# Patient Record
Sex: Female | Born: 1945
Health system: Southern US, Community
[De-identification: ages and names within clinical notes are randomized; demographics above are authoritative.]

## PROBLEM LIST (undated history)

## (undated) DIAGNOSIS — IMO0001 Reserved for inherently not codable concepts without codable children: Secondary | ICD-10-CM

## (undated) DIAGNOSIS — F419 Anxiety disorder, unspecified: Secondary | ICD-10-CM

## (undated) DIAGNOSIS — N184 Chronic kidney disease, stage 4 (severe): Secondary | ICD-10-CM

## (undated) DIAGNOSIS — F329 Major depressive disorder, single episode, unspecified: Secondary | ICD-10-CM

## (undated) DIAGNOSIS — K449 Diaphragmatic hernia without obstruction or gangrene: Secondary | ICD-10-CM

## (undated) DIAGNOSIS — F32A Depression, unspecified: Secondary | ICD-10-CM

## (undated) DIAGNOSIS — J189 Pneumonia, unspecified organism: Secondary | ICD-10-CM

## (undated) DIAGNOSIS — Z5189 Encounter for other specified aftercare: Secondary | ICD-10-CM

## (undated) DIAGNOSIS — M549 Dorsalgia, unspecified: Secondary | ICD-10-CM

## (undated) DIAGNOSIS — G4700 Insomnia, unspecified: Secondary | ICD-10-CM

## (undated) DIAGNOSIS — I10 Essential (primary) hypertension: Secondary | ICD-10-CM

## (undated) DIAGNOSIS — G8929 Other chronic pain: Secondary | ICD-10-CM

## (undated) DIAGNOSIS — C320 Malignant neoplasm of glottis: Secondary | ICD-10-CM

## (undated) DIAGNOSIS — D649 Anemia, unspecified: Secondary | ICD-10-CM

## (undated) DIAGNOSIS — I209 Angina pectoris, unspecified: Secondary | ICD-10-CM

## (undated) DIAGNOSIS — S2239XA Fracture of one rib, unspecified side, initial encounter for closed fracture: Secondary | ICD-10-CM

## (undated) DIAGNOSIS — Z931 Gastrostomy status: Secondary | ICD-10-CM

## (undated) DIAGNOSIS — I639 Cerebral infarction, unspecified: Secondary | ICD-10-CM

## (undated) DIAGNOSIS — R51 Headache: Secondary | ICD-10-CM

## (undated) DIAGNOSIS — Z923 Personal history of irradiation: Secondary | ICD-10-CM

## (undated) DIAGNOSIS — S2249XA Multiple fractures of ribs, unspecified side, initial encounter for closed fracture: Secondary | ICD-10-CM

## (undated) DIAGNOSIS — K5900 Constipation, unspecified: Secondary | ICD-10-CM

## (undated) DIAGNOSIS — R110 Nausea: Secondary | ICD-10-CM

## (undated) DIAGNOSIS — C55 Malignant neoplasm of uterus, part unspecified: Secondary | ICD-10-CM

## (undated) DIAGNOSIS — J449 Chronic obstructive pulmonary disease, unspecified: Secondary | ICD-10-CM

## (undated) DIAGNOSIS — I739 Peripheral vascular disease, unspecified: Secondary | ICD-10-CM

## (undated) DIAGNOSIS — E7800 Pure hypercholesterolemia, unspecified: Secondary | ICD-10-CM

## (undated) DIAGNOSIS — K219 Gastro-esophageal reflux disease without esophagitis: Secondary | ICD-10-CM

## (undated) HISTORY — PX: HERNIA REPAIR: SHX51

## (undated) HISTORY — PX: GASTROTOMY: SHX61

## (undated) HISTORY — DX: Encounter for other specified aftercare: Z51.89

## (undated) HISTORY — PX: ABDOMINAL SURGERY: SHX537

## (undated) HISTORY — PX: APPENDECTOMY: SHX54

---

## 2003-05-02 ENCOUNTER — Encounter: Admission: RE | Admit: 2003-05-02 | Discharge: 2003-05-02 | Payer: Self-pay | Admitting: Internal Medicine

## 2003-05-02 ENCOUNTER — Encounter: Payer: Self-pay | Admitting: Internal Medicine

## 2004-12-30 ENCOUNTER — Encounter: Admission: RE | Admit: 2004-12-30 | Discharge: 2004-12-30 | Payer: Self-pay | Admitting: *Deleted

## 2009-10-30 ENCOUNTER — Emergency Department (HOSPITAL_COMMUNITY): Admission: EM | Admit: 2009-10-30 | Discharge: 2009-10-31 | Payer: Self-pay | Admitting: Emergency Medicine

## 2009-12-14 DIAGNOSIS — I639 Cerebral infarction, unspecified: Secondary | ICD-10-CM

## 2009-12-14 HISTORY — DX: Cerebral infarction, unspecified: I63.9

## 2010-07-09 ENCOUNTER — Observation Stay (HOSPITAL_COMMUNITY): Admission: EM | Admit: 2010-07-09 | Discharge: 2010-07-10 | Payer: Self-pay | Admitting: Emergency Medicine

## 2010-09-07 ENCOUNTER — Emergency Department (HOSPITAL_COMMUNITY): Admission: EM | Admit: 2010-09-07 | Discharge: 2010-09-07 | Payer: Self-pay | Admitting: Emergency Medicine

## 2011-02-28 LAB — URINE CULTURE
Culture  Setup Time: 201107280914
Special Requests: NEGATIVE

## 2011-02-28 LAB — DIFFERENTIAL
Basophils Relative: 1 % (ref 0–1)
Eosinophils Absolute: 0.1 10*3/uL (ref 0.0–0.7)
Eosinophils Relative: 1 % (ref 0–5)
Lymphs Abs: 2.1 10*3/uL (ref 0.7–4.0)

## 2011-02-28 LAB — CBC
MCH: 29.2 pg (ref 26.0–34.0)
MCHC: 35.1 g/dL (ref 30.0–36.0)
MCV: 83.4 fL (ref 78.0–100.0)
Platelets: 241 10*3/uL (ref 150–400)
RBC: 4.32 MIL/uL (ref 3.87–5.11)
RDW: 13.2 % (ref 11.5–15.5)

## 2011-02-28 LAB — POCT I-STAT, CHEM 8
BUN: 44 mg/dL — ABNORMAL HIGH (ref 6–23)
Creatinine, Ser: 1.6 mg/dL — ABNORMAL HIGH (ref 0.4–1.2)
Sodium: 139 mEq/L (ref 135–145)
TCO2: 21 mmol/L (ref 0–100)

## 2011-02-28 LAB — TROPONIN I: Troponin I: 0.02 ng/mL (ref 0.00–0.06)

## 2011-02-28 LAB — HEMOGLOBIN A1C: Mean Plasma Glucose: 128 mg/dL — ABNORMAL HIGH (ref <117)

## 2011-02-28 LAB — CK TOTAL AND CKMB (NOT AT ARMC)
CK, MB: 0.7 ng/mL (ref 0.3–4.0)
Total CK: 40 U/L (ref 7–177)

## 2011-02-28 LAB — POCT CARDIAC MARKERS

## 2011-03-18 LAB — CBC
HCT: 42.9 % (ref 36.0–46.0)
Hemoglobin: 14.5 g/dL (ref 12.0–15.0)
MCHC: 33.8 g/dL (ref 30.0–36.0)
RDW: 13 % (ref 11.5–15.5)

## 2011-03-18 LAB — TROPONIN I: Troponin I: 0.01 ng/mL (ref 0.00–0.06)

## 2011-03-18 LAB — DIFFERENTIAL
Basophils Absolute: 0.1 10*3/uL (ref 0.0–0.1)
Basophils Relative: 1 % (ref 0–1)
Eosinophils Relative: 2 % (ref 0–5)
Monocytes Absolute: 0.7 10*3/uL (ref 0.1–1.0)

## 2011-03-18 LAB — POCT I-STAT, CHEM 8
BUN: 19 mg/dL (ref 6–23)
Calcium, Ion: 1.11 mmol/L — ABNORMAL LOW (ref 1.12–1.32)
HCT: 45 % (ref 36.0–46.0)
TCO2: 25 mmol/L (ref 0–100)

## 2011-03-18 LAB — ALDOSTERONE + RENIN ACTIVITY W/ RATIO: Aldosterone: 2 ng/dL

## 2011-03-18 LAB — TSH: TSH: 1.993 u[IU]/mL (ref 0.350–4.500)

## 2011-03-18 LAB — CK TOTAL AND CKMB (NOT AT ARMC)
Relative Index: INVALID (ref 0.0–2.5)
Total CK: 160 U/L (ref 7–177)

## 2011-03-18 LAB — PROTIME-INR: INR: 1.04 (ref 0.00–1.49)

## 2011-12-15 HISTORY — PX: GASTROSTOMY W/ FEEDING TUBE: SUR642

## 2011-12-15 HISTORY — PX: OTHER SURGICAL HISTORY: SHX169

## 2012-08-08 ENCOUNTER — Encounter (HOSPITAL_COMMUNITY): Payer: Self-pay | Admitting: *Deleted

## 2012-08-08 ENCOUNTER — Emergency Department (HOSPITAL_COMMUNITY): Payer: Medicaid Other

## 2012-08-08 ENCOUNTER — Inpatient Hospital Stay (HOSPITAL_COMMUNITY)
Admission: EM | Admit: 2012-08-08 | Discharge: 2012-08-22 | DRG: 011 | Disposition: A | Discharge status: Home Health | Payer: Medicaid Other | Attending: Internal Medicine | Admitting: Internal Medicine

## 2012-08-08 DIAGNOSIS — S2239XA Fracture of one rib, unspecified side, initial encounter for closed fracture: Secondary | ICD-10-CM

## 2012-08-08 DIAGNOSIS — F419 Anxiety disorder, unspecified: Secondary | ICD-10-CM

## 2012-08-08 DIAGNOSIS — S2242XA Multiple fractures of ribs, left side, initial encounter for closed fracture: Secondary | ICD-10-CM

## 2012-08-08 DIAGNOSIS — S2249XA Multiple fractures of ribs, unspecified side, initial encounter for closed fracture: Secondary | ICD-10-CM | POA: Diagnosis present

## 2012-08-08 DIAGNOSIS — Z8542 Personal history of malignant neoplasm of other parts of uterus: Secondary | ICD-10-CM

## 2012-08-08 DIAGNOSIS — D630 Anemia in neoplastic disease: Secondary | ICD-10-CM | POA: Diagnosis present

## 2012-08-08 DIAGNOSIS — K449 Diaphragmatic hernia without obstruction or gangrene: Secondary | ICD-10-CM | POA: Diagnosis present

## 2012-08-08 DIAGNOSIS — E7800 Pure hypercholesterolemia, unspecified: Secondary | ICD-10-CM | POA: Diagnosis present

## 2012-08-08 DIAGNOSIS — D649 Anemia, unspecified: Secondary | ICD-10-CM | POA: Diagnosis present

## 2012-08-08 DIAGNOSIS — C321 Malignant neoplasm of supraglottis: Principal | ICD-10-CM | POA: Diagnosis present

## 2012-08-08 DIAGNOSIS — N179 Acute kidney failure, unspecified: Secondary | ICD-10-CM | POA: Diagnosis present

## 2012-08-08 DIAGNOSIS — I10 Essential (primary) hypertension: Secondary | ICD-10-CM | POA: Diagnosis present

## 2012-08-08 DIAGNOSIS — Z79899 Other long term (current) drug therapy: Secondary | ICD-10-CM

## 2012-08-08 DIAGNOSIS — IMO0002 Reserved for concepts with insufficient information to code with codable children: Secondary | ICD-10-CM

## 2012-08-08 DIAGNOSIS — C320 Malignant neoplasm of glottis: Secondary | ICD-10-CM | POA: Diagnosis present

## 2012-08-08 DIAGNOSIS — F17200 Nicotine dependence, unspecified, uncomplicated: Secondary | ICD-10-CM | POA: Diagnosis present

## 2012-08-08 DIAGNOSIS — Z720 Tobacco use: Secondary | ICD-10-CM | POA: Diagnosis present

## 2012-08-08 DIAGNOSIS — C329 Malignant neoplasm of larynx, unspecified: Secondary | ICD-10-CM | POA: Diagnosis present

## 2012-08-08 DIAGNOSIS — E876 Hypokalemia: Secondary | ICD-10-CM | POA: Diagnosis not present

## 2012-08-08 DIAGNOSIS — J4489 Other specified chronic obstructive pulmonary disease: Secondary | ICD-10-CM | POA: Diagnosis present

## 2012-08-08 DIAGNOSIS — W06XXXA Fall from bed, initial encounter: Secondary | ICD-10-CM | POA: Diagnosis present

## 2012-08-08 DIAGNOSIS — E43 Unspecified severe protein-calorie malnutrition: Secondary | ICD-10-CM

## 2012-08-08 DIAGNOSIS — Z8673 Personal history of transient ischemic attack (TIA), and cerebral infarction without residual deficits: Secondary | ICD-10-CM

## 2012-08-08 DIAGNOSIS — J449 Chronic obstructive pulmonary disease, unspecified: Secondary | ICD-10-CM

## 2012-08-08 DIAGNOSIS — F411 Generalized anxiety disorder: Secondary | ICD-10-CM | POA: Diagnosis present

## 2012-08-08 DIAGNOSIS — J387 Other diseases of larynx: Secondary | ICD-10-CM

## 2012-08-08 HISTORY — DX: Essential (primary) hypertension: I10

## 2012-08-08 HISTORY — DX: Diaphragmatic hernia without obstruction or gangrene: K44.9

## 2012-08-08 HISTORY — DX: Chronic obstructive pulmonary disease, unspecified: J44.9

## 2012-08-08 HISTORY — DX: Anxiety disorder, unspecified: F41.9

## 2012-08-08 HISTORY — DX: Cerebral infarction, unspecified: I63.9

## 2012-08-08 HISTORY — DX: Malignant neoplasm of glottis: C32.0

## 2012-08-08 HISTORY — DX: Pure hypercholesterolemia, unspecified: E78.00

## 2012-08-08 HISTORY — DX: Malignant neoplasm of uterus, part unspecified: C55

## 2012-08-08 LAB — CBC WITH DIFFERENTIAL/PLATELET
Basophils Absolute: 0.1 10*3/uL (ref 0.0–0.1)
Basophils Relative: 1 % (ref 0–1)
Eosinophils Relative: 2 % (ref 0–5)
HCT: 36.6 % (ref 36.0–46.0)
MCHC: 34.2 g/dL (ref 30.0–36.0)
MCV: 82.6 fL (ref 78.0–100.0)
Monocytes Absolute: 0.8 10*3/uL (ref 0.1–1.0)
RDW: 12.7 % (ref 11.5–15.5)

## 2012-08-08 LAB — BASIC METABOLIC PANEL
CO2: 29 mEq/L (ref 19–32)
Calcium: 10.2 mg/dL (ref 8.4–10.5)
Creatinine, Ser: 1.21 mg/dL — ABNORMAL HIGH (ref 0.50–1.10)
GFR calc Af Amer: 53 mL/min — ABNORMAL LOW (ref >90)

## 2012-08-08 MED ORDER — ALBUTEROL SULFATE (5 MG/ML) 0.5% IN NEBU
5.0000 mg | INHALATION_SOLUTION | Freq: Once | RESPIRATORY_TRACT | Status: AC
  Administered 2012-08-08: 5 mg via RESPIRATORY_TRACT
  Filled 2012-08-08 (×2): qty 0.5

## 2012-08-08 MED ORDER — MORPHINE SULFATE 4 MG/ML IJ SOLN
4.0000 mg | Freq: Once | INTRAMUSCULAR | Status: AC
  Administered 2012-08-08: 4 mg via INTRAVENOUS
  Filled 2012-08-08: qty 1

## 2012-08-08 MED ORDER — IOHEXOL 350 MG/ML SOLN
100.0000 mL | Freq: Once | INTRAVENOUS | Status: AC | PRN
  Administered 2012-08-08: 70 mL via INTRAVENOUS

## 2012-08-08 MED ORDER — IPRATROPIUM BROMIDE 0.02 % IN SOLN
0.5000 mg | Freq: Once | RESPIRATORY_TRACT | Status: AC
  Administered 2012-08-08: 0.5 mg via RESPIRATORY_TRACT
  Filled 2012-08-08: qty 2.5

## 2012-08-08 MED ORDER — IOHEXOL 300 MG/ML  SOLN
100.0000 mL | Freq: Once | INTRAMUSCULAR | Status: AC | PRN
  Administered 2012-08-08: 50 mL via INTRAVENOUS

## 2012-08-08 MED ORDER — LORAZEPAM 2 MG/ML IJ SOLN
1.0000 mg | Freq: Once | INTRAMUSCULAR | Status: AC
  Administered 2012-08-08: 1 mg via INTRAVENOUS
  Filled 2012-08-08: qty 1

## 2012-08-08 MED ORDER — IOHEXOL 300 MG/ML  SOLN: 100.0000 mL | Freq: Once | INTRAMUSCULAR | Status: AC | PRN

## 2012-08-08 MED ORDER — SODIUM CHLORIDE 0.9 % IV SOLN
INTRAVENOUS | Status: DC
  Administered 2012-08-08: 125 mL/h via INTRAVENOUS

## 2012-08-08 NOTE — ED Notes (Signed)
Pt reports sob x1 wk. Saw PCP last week for this, no new meds. Worse with exertion. Pt tachypneic, breathing loudly. Expiratory wheezes audible.

## 2012-08-08 NOTE — H&P (Signed)
[facility] History and Physical  [name] [id number] DOB: 1946-01-22 DOA: 08/08/2012  Referring physician: Dr Zenia Resides PCP: [name], MD   Chief Complaint: L. sided chest pain, and SOB  HPI: [name] is a 66 y.o. female with history of hypertension, tobacco abuse and stroke in the past who presents of with above complaints. She is non-English-speaking and her son at bedside is for the past few months she has had shortness of breath thesource of the history/interpreting. She states that for the past one week she has had left-sided chest pain that is worse with movement. She also reports that she has had shortness of breath for the past several months. She admits to a nonproductive cough and Some weight loss (not quantified) in the past couple months. She saw PCP about a week ago and a chest x-ray was done and was negative. She came to the ED today because of the chest pain/worsening symptoms. In the ED a CT angio of the chest was done and showed a soft tissue mass in the larynx just above the true cords compressing the airway was noted. Fractures of the lateral aspect of the left fifth sixth and seventh ribs were noted. There may be a fracture of the anterior lateral aspect of the eighth rib as well. No pulmonary emboli. A CT of the soft tissue neck was done and showed a 3.8 x 3.2 x 2.6 cm laryngeal mass above the level of the true cords involving the aryepiglottic folds, epiglottis, piriform sinuses and vallecula.  It was noted to be necrotic on the left and causing marked airway narrowing -most compatible with a primary squamous cell cancer. Necrotic metastatic adenopathy on the right compatible with metastatic squamous cell cancer and COPD changes were noted. The EDP consulted ENT-Dr. [name] and he requested admission to hospitalist service, step down unit and he'll see patient in a.m. for possible biopsy.   Review of Systems: The patient denies anorexia, fever, weight loss,,  vision loss, decreased hearing, hoarseness, syncope, peripheral edema, balance deficits, hemoptysis, abdominal pain, melena, hematochezia, severe indigestion/heartburn, hematuria, incontinence,  muscle weakness, transient blindness, difficulty walking, depression, unusual weight change, abnormal bleeding.    Past Medical History  Diagnosis Date  . Hypertension   . Hypercholesteremia   . Anxiety   . Stroke   . Uterine cancer    Past Surgical History  Procedure Date  . Abdominal surgery     r/t uterine carcinoma   Social History:  reports that she has been smoking.  She does not have any smokeless tobacco history on file. She reports that she does not drink alcohol or use illicit drugs. where does patient live--home No Known Allergies  No family history on file.   Prior to Admission medications   Medication Sig Start Date End Date Taking? Authorizing Provider  amitriptyline (ELAVIL) 10 MG tablet Take 10 mg by mouth at bedtime.   Yes Historical Provider, MD  atenolol (TENORMIN) 50 MG tablet Take 25 mg by mouth 2 (two) times daily. Take 1/2 tablet 25 mg total twice a day   Yes Historical Provider, MD  clopidogrel (PLAVIX) 75 MG tablet Take 75 mg by mouth daily.   Yes Historical Provider, MD  Olmesartan-Amlodipine-HCTZ (TRIBENZOR) 20-5-12.5 MG TABS Take 1 tablet by mouth daily.   Yes Historical Provider, MD  pravastatin (PRAVACHOL) 40 MG tablet Take 40 mg by mouth daily.   Yes Historical Provider, MD   Physical Exam: Filed Vitals:   08/08/12 1711 08/08/12 1736 08/08/12 1820  08/08/12 2321  BP: 190/88   152/61  Pulse: 90   77  Temp: 98.9 F (37.2 C)     TempSrc: Oral     Resp: 32   22  SpO2: 95% 96% 96% 96%    Constitutional: Vital signs reviewed.  Patient is a well-developed and well-nourished  in no acute distress and cooperative with exam. Alert and oriented x3.  Head: Normocephalic and atraumatic Mouth: no erythema or exudates, MMM Eyes: PERRL, EOMI, conjunctivae normal, No  scleral icterus.  Neck: Supple, Trachea midline normal ROM, No JVD, mass, thyromegaly, or carotid bruit present.  Cardiovascular: RRR, S1 normal, S2 normal, no MRG, pulses symmetric and intact bilaterally Pulmonary/Chest: mild stridor,moderate airmov't, no wheezes, rales. left sided chest wall tenderness Abdominal: Soft. Non-tender, non-distended, bowel sounds are normal, no masses, organomegaly, or guarding present.  Neurological: A&O x3, Strength is normal and symmetric bilaterally, cranial nerve II-XII are grossly intact, no focal motor deficit, sensory intact to light touch bilaterally.  Skin: Warm, dry and intact. No rash, cyanosis, or clubbing.  Psychiatric: Normal mood and affect. speech and behavior is normal. Judgment and thought content normal. Cognition and memory are normal.    Labs on Admission:  Basic Metabolic Panel:  Lab [id number] 1840  NA 139  K 4.5  CL 101  CO2 29  GLUCOSE 102*  BUN 31*  CREATININE 1.21*  CALCIUM 10.2  MG --  PHOS --   Liver Function Tests: No results found for this basename: AST:5,ALT:5,ALKPHOS:5,BILITOT:5,PROT:5,ALBUMIN:5 in the last 168 hours No results found for this basename: LIPASE:5,AMYLASE:5 in the last 168 hours No results found for this basename: AMMONIA:5 in the last 168 hours CBC:  Lab 08/08/12 1840  WBC 10.4  NEUTROABS 7.0  HGB 12.5  HCT 36.6  MCV 82.6  PLT 309   Cardiac Enzymes: No results found for this basename: CKTOTAL:5,CKMB:5,CKMBINDEX:5,TROPONINI:5 in the last 168 hours  BNP (last 3 results) No results found for this basename: PROBNP:3 in the last 8760 hours CBG: No results found for this basename: GLUCAP:5 in the last 168 hours  Radiological Exams on Admission: Ct Soft Tissue Neck W Contrast  08/08/2012  *RADIOLOGY REPORT*  Clinical Data: Laryngeal mass partially imaged on a chest CTA earlier today.  CT NECK WITH CONTRAST  Technique:  Multidetector CT imaging of the neck was performed with intravenous contrast.   Contrast: 58mL OMNIPAQUE IOHEXOL 300 MG/ML  SOLN  Comparison: Chest CTA obtained earlier today.  Findings: Again demonstrated is a large mass filling and expanding the larynx and hypopharynx above the level of the true cords.  This is involving the epiglottis and aryepiglottic folds and extending into the vallecula and piriform sinuses, greater on the right. This has a fluid density component on the left and is causing marked airway narrowing.  This mass measures 3.8 x 2.6 cm in maximum dimensions on image number 36 of series 8.  On coronal image number 49 of series 9, this measures 3.2 cm in length.  Also demonstrated is a bilobed or two adjacent lymph nodes with central low density in the lateral neck on the right, anterior to the sternocleidomastoid muscle.  This measures 1.8 x 1.7 cm in maximum dimensions on image number 55 of series 2 and 2.7 cm in length on coronal image number 46 of series 3.  No additional enlarged lymph nodes are identified.  Mild bullous changes are demonstrated at the lung apices with biapical pleural and parenchymal scarring.  The thyroid gland is unremarkable.  Cervical  spine degenerative changes.  IMPRESSION:  1.  3.8 x 3.2 x 2.6 cm laryngeal mass above the level of the true cords, involving the aryepiglottic folds, epiglottis, piriform sinuses and vallecula.  This is necrotic on the left and is causing marked airway narrowing.  This is most compatible with a primary squamous cell carcinoma. 2.  Necrotic metastatic adenopathy on the right, compatible with metastatic squamous cell carcinoma. 3.  COPD.   Original Report Authenticated By: Gerald Stabs, M.D.    Ct Angio Chest Pe W/cm &/or Wo Cm  08/08/2012  *RADIOLOGY REPORT*  Clinical Data: Shortness of breath.  Wheezing.  CT ANGIOGRAPHY CHEST  Technique:  Multidetector CT imaging of the chest using the standard protocol during bolus administration of intravenous contrast. Multiplanar reconstructed images including MIPs were obtained  and reviewed to evaluate the vascular anatomy.  Contrast:  56mL OMNIPAQUE IOHEXOL 350 MG/ML SOLN  Comparison: Chest x-ray dated 08/01/2012  Findings: There is a displaced fracture of the lateral aspect of the left sixth rib and a nondisplaced fracture of the lateral aspect of the left fifth rib. There is also a fracture of the anterior lateral aspect of the left seventh rib.  There is a 4.5 mm diameter cylindrical density in the lateral aspect of the right middle lobe, unchanged since prior CT scan dated 05/02/2003.  This could be a chronically impacted bronchus. This is not felt to be significant.  There is abnormal soft tissue prominence in the larynx just above the true cords asymmetric to the right with marked transverse narrowing of the airway.  This could represent a mass.  The area is incompletely visualized on the chest CT.  No visible adenopathy.  Heart size is normal. No infiltrates or effusions.  No pulmonary emboli.  Moderate hiatal hernia.  IMPRESSION:  1.  There appears to be a soft tissue mass in the larynx just above the true cords compressing the airway. 2.  Fractures of the lateral aspects of the left fifth, sixth, and seventh ribs. There may be a fracture of the anterior lateral aspect of the left eighth rib as well. 3.  No pulmonary emboli.   Original Report Authenticated By: Larey Seat, M.D.       Assessment/Plan Principal Problem:  *Multiple closed fractures of ribs of left side -Pain management, supportive care follow and consult PT OT when appropriate. -this is cause of her left-sided chest pain, CTangio negative for PE Active Problems:  Laryngeal mass -Admit to step down unit for close monitoring, as narrowing of airway noted as per CT -ENT consulted as above and Dr. [name] to see patient in a.m. for biopsy and further recommendations. -Follow and consult oncology pending tissue diagnosis.  Hypertension -Monitor and treat as appropriate, IV metoprolol when necessary  while patient is n.p.o. H/o anxiety -continue amitriptyline.  tobacco abuse/COPD -Patient has a prior history of COPD but smokes and COPD changes noted CT -Bronchodilators as needed, supplemental oxygen, follow and further manage accordingly. -Nicotine patch follow for smoking cessation counseling History of CVA -Hold off Plavix now in anticipation of biopsy in a.m., follow resume when appropriate.  Code Status: full Family Communication: family/son at bedsisde Disposition Plan: admit to step down  Time spent: >77mins  Sheila Oats [facility] Pager [phone]  If 7PM-7AM, please contact night-coverage [email or web address] Password [facility] 08/08/2012, 11:29 PM

## 2012-08-08 NOTE — Progress Notes (Signed)
Assisted pt to walk to bathroom without difficulties.  UA provided to CNA

## 2012-08-08 NOTE — ED Provider Notes (Signed)
History     CSN: [id number]  Arrival date & time 08/08/12  1706   First MD Initiated Contact with Patient 08/08/12 1812      Chief Complaint  Patient presents with  . Shortness of Breath    (Consider location/radiation/quality/duration/timing/severity/associated sxs/prior treatment) Patient is a 66 y.o. female presenting with shortness of breath. The history is provided by the patient.  Shortness of Breath  Associated symptoms include shortness of breath.  pt here left [place] chest pain and dyspnea x1 week. Pain is worse with certain movements. No fever or cough. No anginal type symptoms. Seen by her Dr. for same and had negative chest x-ray one week ago. No medications given for this. Denies any leg pain or swelling. Does note increased wheezing. Patient admits to smoking 2 packs cigarettes a day   Past Medical History  Diagnosis Date  . Hypertension   . Hypercholesteremia   . Anxiety   . Stroke   . Uterine cancer     Past Surgical History  Procedure Date  . Abdominal surgery     r/t uterine carcinoma    No family history on file.  History  Substance Use Topics  . Smoking status: Current Everyday Smoker  . Smokeless tobacco: Not on file  . Alcohol Use: No    OB History    Grav Para Term Preterm Abortions TAB SAB Ect Mult Living                  Review of Systems  Respiratory: Positive for shortness of breath.     Allergies  Review of patient's allergies indicates no known allergies.  Home Medications   Current Outpatient Rx  Name Route Sig Dispense Refill  . AMITRIPTYLINE HCL 10 MG PO TABS Oral Take 10 mg by mouth at bedtime.    . ATENOLOL 50 MG PO TABS Oral Take 25 mg by mouth 2 (two) times daily. Take 1/2 tablet 25 mg total twice a day    . CLOPIDOGREL BISULFATE 75 MG PO TABS Oral Take 75 mg by mouth daily.    [name] OLMESARTAN-AMLODIPINE-HCTZ 20-5-12.5 MG PO TABS Oral Take 1 tablet by mouth daily.    [name] PRAVASTATIN SODIUM 40 MG PO TABS Oral Take 40 mg  by mouth daily.      BP 190/88  Pulse 90  Temp 98.9 F (37.2 C) (Oral)  Resp 32  SpO2 96%  Physical Exam  Nursing note and vitals reviewed. Constitutional: She is oriented to person, place, and time. She appears well-developed and well-nourished.  Non-toxic appearance. No distress.  HENT:  Head: Normocephalic and atraumatic.  Eyes: Conjunctivae, EOM and lids are normal. Pupils are equal, round, and reactive to light.  Neck: Normal range of motion. Neck supple. No tracheal deviation present. No mass present.  Cardiovascular: Normal rate, regular rhythm and normal heart sounds.  Exam reveals no gallop.   No murmur heard. Pulmonary/Chest: Effort normal and breath sounds normal. No stridor. No respiratory distress. She has no decreased breath sounds. She has no wheezes. She has no rhonchi. She has no rales. She exhibits no crepitus.    Abdominal: Soft. Normal appearance and bowel sounds are normal. She exhibits no distension. There is no tenderness. There is no rebound and no CVA tenderness.  Musculoskeletal: Normal range of motion. She exhibits no edema and no tenderness.  Neurological: She is alert and oriented to person, place, and time. She has normal strength. No cranial nerve deficit or sensory deficit. GCS eye  subscore is 4. GCS verbal subscore is 5. GCS motor subscore is 6.  Skin: Skin is warm and dry. No abrasion and no rash noted.  Psychiatric: She has a normal mood and affect. Her speech is normal and behavior is normal.    ED Course  Procedures (including critical care time)   Labs Reviewed  CBC WITH DIFFERENTIAL  BASIC METABOLIC PANEL   No results found.   No diagnosis found.    MDM  Patient able to maintain her airway. Given pain medication here. CAT scan results reviewed and patient likely has squamous cell cancer. Spoke with ent to call Dr. [name] he was seen in consultation. Patient to be admitted by [facility]        Leota Jacobsen,  MD 08/08/12 [phone]

## 2012-08-09 DIAGNOSIS — N179 Acute kidney failure, unspecified: Secondary | ICD-10-CM

## 2012-08-09 DIAGNOSIS — J449 Chronic obstructive pulmonary disease, unspecified: Secondary | ICD-10-CM

## 2012-08-09 LAB — CREATININE, SERUM: GFR calc Af Amer: 57 mL/min — ABNORMAL LOW (ref >90)

## 2012-08-09 LAB — CBC
MCV: 84.2 fL (ref 78.0–100.0)
Platelets: 254 10*3/uL (ref 150–400)
RDW: 13 % (ref 11.5–15.5)
WBC: 9.1 10*3/uL (ref 4.0–10.5)

## 2012-08-09 LAB — MRSA PCR SCREENING: MRSA by PCR: NEGATIVE

## 2012-08-09 MED ORDER — ATENOLOL 25 MG PO TABS
25.0000 mg | ORAL_TABLET | Freq: Two times a day (BID) | ORAL | Status: DC
  Filled 2012-08-09 (×4): qty 1

## 2012-08-09 MED ORDER — AMLODIPINE BESYLATE 10 MG PO TABS
10.0000 mg | ORAL_TABLET | Freq: Every day | ORAL | Status: DC
  Filled 2012-08-09 (×2): qty 1

## 2012-08-09 MED ORDER — DEXTROSE-NACL 5-0.9 % IV SOLN
INTRAVENOUS | Status: DC
  Administered 2012-08-09 (×2): via INTRAVENOUS
  Administered 2012-08-10: 100 mL via INTRAVENOUS
  Administered 2012-08-10 – 2012-08-12 (×3): via INTRAVENOUS
  Administered 2012-08-12: 100 mL via INTRAVENOUS
  Administered 2012-08-12: 100 mL/h via INTRAVENOUS
  Administered 2012-08-13: 100 mL via INTRAVENOUS
  Administered 2012-08-14: 50 mL/h via INTRAVENOUS
  Administered 2012-08-14: 03:00:00 via INTRAVENOUS
  Administered 2012-08-15: 50 mL/h via INTRAVENOUS
  Administered 2012-08-16 – 2012-08-17 (×2): via INTRAVENOUS
  Administered 2012-08-18: 1000 mL via INTRAVENOUS
  Administered 2012-08-19 – 2012-08-22 (×4): via INTRAVENOUS

## 2012-08-09 MED ORDER — AMITRIPTYLINE HCL 10 MG PO TABS
10.0000 mg | ORAL_TABLET | Freq: Every day | ORAL | Status: DC
  Administered 2012-08-13 – 2012-08-21 (×9): 10 mg via ORAL
  Filled 2012-08-09 (×14): qty 1

## 2012-08-09 MED ORDER — PNEUMOCOCCAL VAC POLYVALENT 25 MCG/0.5ML IJ INJ
0.5000 mL | INJECTION | INTRAMUSCULAR | Status: AC
  Administered 2012-08-10: 0.5 mL via INTRAMUSCULAR
  Filled 2012-08-09: qty 0.5

## 2012-08-09 MED ORDER — ONDANSETRON HCL 4 MG PO TABS: 4.0000 mg | ORAL_TABLET | Freq: Four times a day (QID) | ORAL | Status: DC | PRN

## 2012-08-09 MED ORDER — ONDANSETRON HCL 4 MG/2ML IJ SOLN
4.0000 mg | Freq: Four times a day (QID) | INTRAMUSCULAR | Status: DC | PRN
  Administered 2012-08-12 – 2012-08-20 (×15): 4 mg via INTRAVENOUS
  Filled 2012-08-09 (×16): qty 2

## 2012-08-09 MED ORDER — ACETAMINOPHEN 650 MG RE SUPP: 650.0000 mg | Freq: Four times a day (QID) | RECTAL | Status: DC | PRN

## 2012-08-09 MED ORDER — METOPROLOL TARTRATE 1 MG/ML IV SOLN
5.0000 mg | Freq: Four times a day (QID) | INTRAVENOUS | Status: DC | PRN
  Administered 2012-08-10 – 2012-08-17 (×14): 5 mg via INTRAVENOUS
  Filled 2012-08-09 (×12): qty 5

## 2012-08-09 MED ORDER — IPRATROPIUM BROMIDE 0.02 % IN SOLN
0.5000 mg | Freq: Four times a day (QID) | RESPIRATORY_TRACT | Status: DC | PRN
  Administered 2012-08-10 – 2012-08-21 (×5): 0.5 mg via RESPIRATORY_TRACT
  Filled 2012-08-09 (×5): qty 2.5

## 2012-08-09 MED ORDER — ALBUTEROL SULFATE (5 MG/ML) 0.5% IN NEBU
2.5000 mg | INHALATION_SOLUTION | RESPIRATORY_TRACT | Status: DC | PRN
  Administered 2012-08-10 – 2012-08-21 (×6): 2.5 mg via RESPIRATORY_TRACT
  Filled 2012-08-09 (×6): qty 0.5

## 2012-08-09 MED ORDER — ENOXAPARIN SODIUM 40 MG/0.4ML ~~LOC~~ SOLN
40.0000 mg | SUBCUTANEOUS | Status: DC
  Administered 2012-08-09: 40 mg via SUBCUTANEOUS
  Filled 2012-08-09 (×2): qty 0.4

## 2012-08-09 MED ORDER — HYDROMORPHONE HCL PF 1 MG/ML IJ SOLN
1.0000 mg | INTRAMUSCULAR | Status: DC | PRN
  Administered 2012-08-09: 1 mg via INTRAVENOUS
  Administered 2012-08-10: 0.5 mg via INTRAVENOUS
  Filled 2012-08-09 (×2): qty 1

## 2012-08-09 MED ORDER — LEVALBUTEROL HCL 0.63 MG/3ML IN NEBU
0.6300 mg | INHALATION_SOLUTION | RESPIRATORY_TRACT | Status: DC | PRN
  Filled 2012-08-09: qty 3

## 2012-08-09 MED ORDER — BIOTENE DRY MOUTH MT LIQD
15.0000 mL | Freq: Two times a day (BID) | OROMUCOSAL | Status: DC
  Administered 2012-08-09 – 2012-08-11 (×5): 15 mL via OROMUCOSAL

## 2012-08-09 MED ORDER — ACETAMINOPHEN 325 MG PO TABS: 650.0000 mg | ORAL_TABLET | Freq: Four times a day (QID) | ORAL | Status: DC | PRN

## 2012-08-09 MED ORDER — ENSURE COMPLETE PO LIQD
237.0000 mL | Freq: Two times a day (BID) | ORAL | Status: DC
  Administered 2012-08-09 – 2012-08-15 (×7): 237 mL via ORAL

## 2012-08-09 MED ORDER — NICOTINE 21 MG/24HR TD PT24
21.0000 mg | MEDICATED_PATCH | Freq: Every day | TRANSDERMAL | Status: DC
  Administered 2012-08-09 – 2012-08-22 (×14): 21 mg via TRANSDERMAL
  Filled 2012-08-09 (×14): qty 1

## 2012-08-09 MED ORDER — SODIUM CHLORIDE 0.9 % IV SOLN
INTRAVENOUS | Status: DC
  Administered 2012-08-09 (×2): via INTRAVENOUS

## 2012-08-09 MED ORDER — NICOTINE 14 MG/24HR TD PT24
14.0000 mg | MEDICATED_PATCH | Freq: Every day | TRANSDERMAL | Status: DC
  Filled 2012-08-09: qty 1

## 2012-08-09 NOTE — ED Notes (Signed)
Family at bedside. Pt placed on 2 liters. sats dropped to 88. Likely d/t morphine and pt asleep.

## 2012-08-09 NOTE — Progress Notes (Signed)
[facility] PROGRESS NOTE  [name] [id number] DOB: 06-01-1946 DOA: 08/08/2012 PCP: [name], MD  Assessment/Plan:  Principal Problem: Laryngeal mass Patient gives a history of progressive hoarseness of twice for her last few weeks and also subjective weight loss of about 20 pounds for last 4 months. She has history off heavy smoking of about 2 packs a day for past 35 years. CT of the neck showing a laryngeal mass of the level off to focal course with necrosis on the left and causing airway narrowing compatible with primary small cell carcinoma along with metastatic adenopathy to the right of the neck. -Continue step down monitoring with close monitoring of any airway compromise. -Patient currently maintaining sats on 2 L via nasal cannula - Keep patient n.p.o. with IV fluids for now - seen by ENT consult Dr. [name] who plans on the direct laryngoscopy with biopsy off the supraglottic tumor likely tomorrow. Recommends for oncology and radiation on involvement can be consulted once tissue diagnosis obtained. -Continue with albuterol and ipratropium inhaler as needed   *Multiple closed fractures of ribs of left side Patient informs of having off fall from the bed on her left chest about 2 weeks back and the rib fractures are probably related to this. Continue with pain control    Hypertension Continue metoprolol  Tobacco abuse Counseled on smoking cessation Provided nicotine patch  Code Status: Full code Family Communication: Sons at bedside who interpreted and explained plan of care Disposition Plan: step down monitoring for today   Brief narrative: 66 y/o spanish speaking female originally from [place] came to ED with  left-sided chest pain that is worse with movement for one week. She also reports that she has had shortness of breath for the past several months. She admits to a nonproductive cough and Some weight loss  in the past couple months. . In the ED  a CT angio of the chest was done and showed a soft tissue mass in the larynx just above the true cords compressing the airway was noted. Fractures of the lateral aspect of the left fifth sixth and seventh ribs were noted.  No pulmonary emboli seen. A CT of the soft tissue neck was done and showed a 3.8 x 3.2 x 2.6 cm laryngeal mass above the level of the true cords involving the aryepiglottic folds, epiglottis, piriform sinuses and vallecula. It was noted to be necrotic on the left and causing marked airway narrowing -most compatible with a primary squamous cell cancer. Necrotic metastatic adenopathy on the right compatible with metastatic squamous cell cancer and COPD changes were noted.   Consultants:  Dr. [name] (ENT)  Oncology consult will be obtained after biopsy off the laryngeal mass  Procedures:  Plan for direct laryngoscopy likely tomorrow  Antibiotics:  None  HPI/Subjective: Patient is Spanish-speaking . Family at bedside who interpreted for the patient. She denies any chest pain at this time. Denies any shortness of breath, choking or stridor . Has some dry cough  Objective: Filed Vitals:   08/09/12 0200 08/09/12 0400 08/09/12 0625 08/09/12 0800  BP: 143/95 152/75 140/58 152/57  Pulse:  72 67 80  Temp:  98.1 F (36.7 C)  97.9 F (36.6 C)  TempSrc:    Oral  Resp:  23 21 17   Height:      Weight:      SpO2:  97% 98% 100%    Intake/Output Summary (Last 24 hours) at 08/09/12 1025 Last data filed at 08/09/12 0800  Gross per  24 hour  Intake    575 ml  Output    350 ml  Net    225 ml   Filed Weights   08/09/12 0145  Weight: 51.1 kg (112 lb 10.5 oz)    Exam:   General:  Elderly female lying in bed in no acute distress  HEENT : No pallor , moist oral mucosa, hoarse voice, palpable cervical lymphadenopathy bilaterally  Cardiovascular: Normal S1 and S2 no murmurs  Respiratory: Bilateral fine crackles no rhonchi or wheeze  Abdomen: Soft, nontender  nondistended, bowel sounds present  Extremities: Warm, no edema  CNS: AAO x3, nonfocal  Data Reviewed: Basic Metabolic Panel:  Lab [id number] 08/08/12 1840  NA -- 139  K -- 4.5  CL -- 101  CO2 -- 29  GLUCOSE -- 102*  BUN -- 31*  CREATININE 1.14* 1.21*  CALCIUM -- 10.2  MG -- --  PHOS -- --   Liver Function Tests: No results found for this basename: AST:5,ALT:5,ALKPHOS:5,BILITOT:5,PROT:5,ALBUMIN:5 in the last 168 hours No results found for this basename: LIPASE:5,AMYLASE:5 in the last 168 hours No results found for this basename: AMMONIA:5 in the last 168 hours CBC:  Lab 08/09/12 0327 08/08/12 1840  WBC 9.1 10.4  NEUTROABS -- 7.0  HGB 11.3* 12.5  HCT 34.6* 36.6  MCV 84.2 82.6  PLT 254 309   Cardiac Enzymes: No results found for this basename: CKTOTAL:5,CKMB:5,CKMBINDEX:5,TROPONINI:5 in the last 168 hours BNP (last 3 results) No results found for this basename: PROBNP:3 in the last 8760 hours CBG: No results found for this basename: GLUCAP:5 in the last 168 hours  Recent Results (from the past 240 hour(s))  MRSA PCR SCREENING     Status: Normal   Collection Time   08/09/12  2:36 AM      Component Value Range Status Comment   MRSA by PCR NEGATIVE  NEGATIVE Final      Studies: Ct Soft Tissue Neck W Contrast  08/08/2012  *RADIOLOGY REPORT*  Clinical Data: Laryngeal mass partially imaged on a chest CTA earlier today.  CT NECK WITH CONTRAST  Technique:  Multidetector CT imaging of the neck was performed with intravenous contrast.  Contrast: 39mL OMNIPAQUE IOHEXOL 300 MG/ML  SOLN  Comparison: Chest CTA obtained earlier today.  Findings: Again demonstrated is a large mass filling and expanding the larynx and hypopharynx above the level of the true cords.  This is involving the epiglottis and aryepiglottic folds and extending into the vallecula and piriform sinuses, greater on the right. This has a fluid density component on the left and is causing marked airway narrowing.   This mass measures 3.8 x 2.6 cm in maximum dimensions on image number 36 of series 8.  On coronal image number 49 of series 9, this measures 3.2 cm in length.  Also demonstrated is a bilobed or two adjacent lymph nodes with central low density in the lateral neck on the right, anterior to the sternocleidomastoid muscle.  This measures 1.8 x 1.7 cm in maximum dimensions on image number 55 of series 2 and 2.7 cm in length on coronal image number 46 of series 3.  No additional enlarged lymph nodes are identified.  Mild bullous changes are demonstrated at the lung apices with biapical pleural and parenchymal scarring.  The thyroid gland is unremarkable.  Cervical spine degenerative changes.  IMPRESSION:  1.  3.8 x 3.2 x 2.6 cm laryngeal mass above the level of the true cords, involving the aryepiglottic folds, epiglottis, piriform sinuses and  vallecula.  This is necrotic on the left and is causing marked airway narrowing.  This is most compatible with a primary squamous cell carcinoma. 2.  Necrotic metastatic adenopathy on the right, compatible with metastatic squamous cell carcinoma. 3.  COPD.   Original Report Authenticated By: Gerald Stabs, M.D.    Ct Angio Chest Pe W/cm &/or Wo Cm  08/08/2012  *RADIOLOGY REPORT*  Clinical Data: Shortness of breath.  Wheezing.  CT ANGIOGRAPHY CHEST  Technique:  Multidetector CT imaging of the chest using the standard protocol during bolus administration of intravenous contrast. Multiplanar reconstructed images including MIPs were obtained and reviewed to evaluate the vascular anatomy.  Contrast:  31mL OMNIPAQUE IOHEXOL 350 MG/ML SOLN  Comparison: Chest x-ray dated 08/01/2012  Findings: There is a displaced fracture of the lateral aspect of the left sixth rib and a nondisplaced fracture of the lateral aspect of the left fifth rib. There is also a fracture of the anterior lateral aspect of the left seventh rib.  There is a 4.5 mm diameter cylindrical density in the lateral aspect  of the right middle lobe, unchanged since prior CT scan dated 05/02/2003.  This could be a chronically impacted bronchus. This is not felt to be significant.  There is abnormal soft tissue prominence in the larynx just above the true cords asymmetric to the right with marked transverse narrowing of the airway.  This could represent a mass.  The area is incompletely visualized on the chest CT.  No visible adenopathy.  Heart size is normal. No infiltrates or effusions.  No pulmonary emboli.  Moderate hiatal hernia.  IMPRESSION:  1.  There appears to be a soft tissue mass in the larynx just above the true cords compressing the airway. 2.  Fractures of the lateral aspects of the left fifth, sixth, and seventh ribs. There may be a fracture of the anterior lateral aspect of the left eighth rib as well. 3.  No pulmonary emboli.   Original Report Authenticated By: Larey Seat, M.D.     Scheduled Meds:   . albuterol  5 mg Nebulization Once  . amitriptyline  10 mg Oral QHS  . antiseptic oral rinse  15 mL Mouth Rinse BID  . enoxaparin (LOVENOX) injection  40 mg Subcutaneous Q24H  . ipratropium  0.5 mg Nebulization Once  . LORazepam  1 mg Intravenous Once  .  morphine injection  4 mg Intravenous Once  . nicotine  21 mg Transdermal Daily  . pneumococcal 23 valent vaccine  0.5 mL Intramuscular Tomorrow-1000  . DISCONTD: nicotine  14 mg Transdermal Daily   Continuous Infusions:   . dextrose 5 % and 0.9% NaCl    . DISCONTD: sodium chloride 125 mL/hr (08/08/12 1900)  . DISCONTD: sodium chloride 100 mL/hr at 08/09/12 0853      Time spent: 40 minutes    Stacia Feazell  [facility] Pager [phone]. If 8PM-8AM, please contact night-coverage at [email or web address], password [facility] 08/09/2012, 10:25 AM  LOS: 1 day

## 2012-08-09 NOTE — Consult Note (Signed)
ENT CONSULT:  Reason for Consult: Supraglottic Mass Referring Physician: Hosp.  [name] is an 66 y.o. female.  HPI: Family reports 4 wk hx of progressive sore throat, hoarseness and dif swallowing. Chronic smoker, >2ppd. Adm via ER with chest pain, incidental rib frx's.  Past Medical History  Diagnosis Date  . Hypertension   . Hypercholesteremia   . Anxiety   . Stroke   . Uterine cancer     Past Surgical History  Procedure Date  . Abdominal surgery     r/t uterine carcinoma    No family history on file.  Social History:  reports that she has been smoking.  She does not have any smokeless tobacco history on file. She reports that she does not drink alcohol or use illicit drugs.  Allergies: No Known Allergies  Medications: I have reviewed the patient's current medications.  Results for orders placed during the hospital encounter of 08/08/12 (from the past 48 hour(s))  CBC WITH DIFFERENTIAL     Status: Normal   Collection Time   08/08/12  6:40 PM      Component Value Range Comment   WBC 10.4  4.0 - 10.5 K/uL    RBC 4.43  3.87 - 5.11 MIL/uL    Hemoglobin 12.5  12.0 - 15.0 g/dL    HCT 36.6  36.0 - 46.0 %    MCV 82.6  78.0 - 100.0 fL    MCH 28.2  26.0 - 34.0 pg    MCHC 34.2  30.0 - 36.0 g/dL    RDW 12.7  11.5 - 15.5 %    Platelets 309  150 - 400 K/uL    Neutrophils Relative 67  43 - 77 %    Neutro Abs 7.0  1.7 - 7.7 K/uL    Lymphocytes Relative 22  12 - 46 %    Lymphs Abs 2.3  0.7 - 4.0 K/uL    Monocytes Relative 8  3 - 12 %    Monocytes Absolute 0.8  0.1 - 1.0 K/uL    Eosinophils Relative 2  0 - 5 %    Eosinophils Absolute 0.2  0.0 - 0.7 K/uL    Basophils Relative 1  0 - 1 %    Basophils Absolute 0.1  0.0 - 0.1 K/uL   BASIC METABOLIC PANEL     Status: Abnormal   Collection Time   08/08/12  6:40 PM      Component Value Range Comment   Sodium 139  135 - 145 mEq/L    Potassium 4.5  3.5 - 5.1 mEq/L    Chloride 101  96 - 112 mEq/L    CO2 29  19 - 32 mEq/L      Glucose, Bld 102 (*) 70 - 99 mg/dL    BUN 31 (*) 6 - 23 mg/dL    Creatinine, Ser 1.21 (*) 0.50 - 1.10 mg/dL    Calcium 10.2  8.4 - 10.5 mg/dL    GFR calc non Af Amer 46 (*) >90 mL/min    GFR calc Af Amer 53 (*) >90 mL/min   MRSA PCR SCREENING     Status: Normal   Collection Time   08/09/12  2:36 AM      Component Value Range Comment   MRSA by PCR NEGATIVE  NEGATIVE   CBC     Status: Abnormal   Collection Time   08/09/12  3:27 AM      Component Value Range Comment   WBC 9.1  4.0 - 10.5 K/uL    RBC 4.11  3.87 - 5.11 MIL/uL    Hemoglobin 11.3 (*) 12.0 - 15.0 g/dL    HCT 34.6 (*) 36.0 - 46.0 %    MCV 84.2  78.0 - 100.0 fL    MCH 27.5  26.0 - 34.0 pg    MCHC 32.7  30.0 - 36.0 g/dL    RDW 13.0  11.5 - 15.5 %    Platelets 254  150 - 400 K/uL   CREATININE, SERUM     Status: Abnormal   Collection Time   08/09/12  3:27 AM      Component Value Range Comment   Creatinine, Ser 1.14 (*) 0.50 - 1.10 mg/dL    GFR calc non Af Amer 49 (*) >90 mL/min    GFR calc Af Amer 57 (*) >90 mL/min     Ct Soft Tissue Neck W Contrast  08/08/2012  *RADIOLOGY REPORT*  Clinical Data: Laryngeal mass partially imaged on a chest CTA earlier today.  CT NECK WITH CONTRAST  Technique:  Multidetector CT imaging of the neck was performed with intravenous contrast.  Contrast: 47mL OMNIPAQUE IOHEXOL 300 MG/ML  SOLN  Comparison: Chest CTA obtained earlier today.  Findings: Again demonstrated is a large mass filling and expanding the larynx and hypopharynx above the level of the true cords.  This is involving the epiglottis and aryepiglottic folds and extending into the vallecula and piriform sinuses, greater on the right. This has a fluid density component on the left and is causing marked airway narrowing.  This mass measures 3.8 x 2.6 cm in maximum dimensions on image number 36 of series 8.  On coronal image number 49 of series 9, this measures 3.2 cm in length.  Also demonstrated is a bilobed or two adjacent lymph nodes  with central low density in the lateral neck on the right, anterior to the sternocleidomastoid muscle.  This measures 1.8 x 1.7 cm in maximum dimensions on image number 55 of series 2 and 2.7 cm in length on coronal image number 46 of series 3.  No additional enlarged lymph nodes are identified.  Mild bullous changes are demonstrated at the lung apices with biapical pleural and parenchymal scarring.  The thyroid gland is unremarkable.  Cervical spine degenerative changes.  IMPRESSION:  1.  3.8 x 3.2 x 2.6 cm laryngeal mass above the level of the true cords, involving the aryepiglottic folds, epiglottis, piriform sinuses and vallecula.  This is necrotic on the left and is causing marked airway narrowing.  This is most compatible with a primary squamous cell carcinoma. 2.  Necrotic metastatic adenopathy on the right, compatible with metastatic squamous cell carcinoma. 3.  COPD.   Original Report Authenticated By: Gerald Stabs, M.D.    Ct Angio Chest Pe W/cm &/or Wo Cm  08/08/2012  *RADIOLOGY REPORT*  Clinical Data: Shortness of breath.  Wheezing.  CT ANGIOGRAPHY CHEST  Technique:  Multidetector CT imaging of the chest using the standard protocol during bolus administration of intravenous contrast. Multiplanar reconstructed images including MIPs were obtained and reviewed to evaluate the vascular anatomy.  Contrast:  32mL OMNIPAQUE IOHEXOL 350 MG/ML SOLN  Comparison: Chest x-ray dated 08/01/2012  Findings: There is a displaced fracture of the lateral aspect of the left sixth rib and a nondisplaced fracture of the lateral aspect of the left fifth rib. There is also a fracture of the anterior lateral aspect of the left seventh rib.  There is a 4.5 mm diameter  cylindrical density in the lateral aspect of the right middle lobe, unchanged since prior CT scan dated 05/02/2003.  This could be a chronically impacted bronchus. This is not felt to be significant.  There is abnormal soft tissue prominence in the larynx just  above the true cords asymmetric to the right with marked transverse narrowing of the airway.  This could represent a mass.  The area is incompletely visualized on the chest CT.  No visible adenopathy.  Heart size is normal. No infiltrates or effusions.  No pulmonary emboli.  Moderate hiatal hernia.  IMPRESSION:  1.  There appears to be a soft tissue mass in the larynx just above the true cords compressing the airway. 2.  Fractures of the lateral aspects of the left fifth, sixth, and seventh ribs. There may be a fracture of the anterior lateral aspect of the left eighth rib as well. 3.  No pulmonary emboli.   Original Report Authenticated By: Larey Seat, M.D.     ROS:Per pt adm  Blood pressure 140/58, pulse 67, temperature 98.1 F (36.7 C), temperature source Oral, resp. rate 21, height 5' (1.524 m), weight 51.1 kg (112 lb 10.5 oz), SpO2 98.00%.  PHYSICAL EXAM: General appearance - oriented to person, place, and time and anxious Nose - normal and patent, no erythema, discharge or polyps Mouth - mucous membranes moist, pharynx normal without lesions Neck - adenopathy noted 2 cm NT Rt mid-neck Larynx- Flex. Exam performed at bedside, swollen and erythematous supraglottic mass involves the epiglottis and Rt AE fold. Airway narrow, VC's clear.  Studies Reviewed:Ct scans  Assessment/Plan: Pt adm with prog hx of hoarseness and ST. Ct and exam c/w primary supraglottic tumor and Rt neck met. Plan DL/Bx and trach for airway control (8/28 or 8/29). Need Onc and Rad onc involvement for primary tx with surgical salvage if incomplete response.  [place], [place] 08/09/2012, 8:05 AM

## 2012-08-09 NOTE — Progress Notes (Signed)
Pt's home med Tribenzor was brought in by the family and requested by the pt and her family to be reordered and pt refused her Atenolol and Norvasc that was ordered because "it took a very long time to get her blood pressure medications right," and she would rather "not change anything right now." I spoke with the attending MD and let him know the pt and families wishes but because the pt's creatinine level is elevated he wants to hold this medication for now and re-evaluate after further lab work. Will continue monitor.

## 2012-08-09 NOTE — Progress Notes (Signed)
CARE MANAGEMENT NOTE 08/09/2012  Patient:  SANSA, MCLAUGLIN   Account Number:  [id number]  Date Initiated:  08/09/2012  Documentation initiated by:  Adean Milosevic  Subjective/Objective Assessment:   pt with partial airway obstruction from necortic mass in the larygneal level,,     Action/Plan:   from home   Anticipated DC Date:  08/12/2012   Anticipated DC Plan:  HOME/SELF CARE  In-house referral  NA      DC Planning Services  NA      [facility] Choice  NA   Choice offered to / List presented to:  NA   DME arranged  NA      DME agency  NA     [place] arranged  NA      [place] agency  NA   Status of service:  In process, will continue to follow Medicare Important Message given?  NA - LOS <3 / Initial given by admissions (If response is "NO", the following Medicare IM given date fields will be blank) Date Medicare IM given:   Date Additional Medicare IM given:    Discharge Disposition:    Per UR Regulation:  Reviewed for med. necessity/level of care/duration of stay  If discussed at [place] of Stay Meetings, dates discussed:    Comments:  [id number]/Braedyn Riggle Rosana Hoes, RN, BSN, CCM: CHART REVIEWED AND UPDATED. NO DISCHARGE NEEDS PRESENT AT THIS TIME. CASE MANAGEMENT [phone]

## 2012-08-09 NOTE — Progress Notes (Signed)
INITIAL ADULT NUTRITION ASSESSMENT Date: 08/09/2012   Time: 10:47 AM Reason for Assessment: Nutrition Risk  ASSESSMENT: Female 66 y.o.  Dx: Multiple closed fractures of ribs of left side  Hx:  Past Medical History  Diagnosis Date  . Hypertension   . Hypercholesteremia   . Anxiety   . Stroke   . Uterine cancer     Related Meds:  Scheduled Meds:   . albuterol  5 mg Nebulization Once  . amitriptyline  10 mg Oral QHS  . antiseptic oral rinse  15 mL Mouth Rinse BID  . enoxaparin (LOVENOX) injection  40 mg Subcutaneous Q24H  . ipratropium  0.5 mg Nebulization Once  . LORazepam  1 mg Intravenous Once  .  morphine injection  4 mg Intravenous Once  . nicotine  21 mg Transdermal Daily  . pneumococcal 23 valent vaccine  0.5 mL Intramuscular Tomorrow-1000  . DISCONTD: nicotine  14 mg Transdermal Daily   Continuous Infusions:   . dextrose 5 % and 0.9% NaCl    . DISCONTD: sodium chloride 125 mL/hr (08/08/12 1900)  . DISCONTD: sodium chloride 100 mL/hr at 08/09/12 0853   PRN Meds:.acetaminophen, acetaminophen, albuterol, HYDROmorphone (DILAUDID) injection, iohexol, iohexol, iohexol, ipratropium, metoprolol, ondansetron (ZOFRAN) IV, ondansetron, DISCONTD: levalbuterol   Ht: 5' (152.4 cm)  Wt: 112 lb 10.5 oz (51.1 kg)  Ideal Wt: 45.5 kg  % Ideal Wt: 112% Wt Readings from Last 10 Encounters:  08/09/12 112 lb 10.5 oz (51.1 kg)    Usual Wt: 113 lb per pt family % Usual Wt: 99%  BMI: 21.8 kg/m^2 (WNL)  Food/Nutrition Related Hx: Spoke with patient's family. Family reported patient's appetite and intake have been well PTA. RN changed patient's diet to dysphagia 3 soft foods this morning. Per MD patient with neck mass causing narrowing of the airway. Discussed patient in rounds.   Labs:  CMP     Component Value Date/Time   NA 139 08/08/2012 1840   K 4.5 08/08/2012 1840   CL 101 08/08/2012 1840   CO2 29 08/08/2012 1840   GLUCOSE 102* 08/08/2012 1840   BUN 31* 08/08/2012 1840   CREATININE 1.14* 08/09/2012 0327   CALCIUM 10.2 08/08/2012 1840   GFRNONAA 49* 08/09/2012 0327   GFRAA 57* 08/09/2012 0327    Intake/Output Summary (Last 24 hours) at 08/09/12 1047 Last data filed at 08/09/12 0800  Gross per 24 hour  Intake    575 ml  Output    350 ml  Net    225 ml     Diet Order: Dysphagia  Supplements/Tube Feeding: none at this time  IVF:    dextrose 5 % and 0.9% NaCl   DISCONTD: sodium chloride Last Rate: 125 mL/hr (08/08/12 1900)  DISCONTD: sodium chloride Last Rate: 100 mL/hr at 08/09/12 0853    Estimated Nutritional Needs:   Kcal: [id number] Protein: 61-76 grams  Fluid: 1 ml per kcal intake  NUTRITION DIAGNOSIS: -Predicted sup-optimal energy intake.  Status: Ongoing  RELATED TO: neck mass  AS EVIDENCE BY: history of swallowing difficulty and  narrowed airway likely to affect PO intake.   MONITORING/EVALUATION(Goals): PO intake, weights, labs, swallowing ability   EDUCATION NEEDS: -No education needs identified at this time  INTERVENTION: 1. Will order patient Ensure BID, provides 500 kcal and 18 grams of protein daily.  2. Will order magic cup once daily. 3. RD to follow for nutrition plan of care.   Dietitian [phone]  DOCUMENTATION CODES Per approved criteria  -Not Applicable  Loyce Dys [facility] 08/09/2012, 10:47 AM

## 2012-08-10 ENCOUNTER — Inpatient Hospital Stay (HOSPITAL_COMMUNITY): Payer: Medicaid Other | Admitting: Anesthesiology

## 2012-08-10 ENCOUNTER — Encounter (HOSPITAL_COMMUNITY): Payer: Self-pay | Admitting: Anesthesiology

## 2012-08-10 ENCOUNTER — Encounter (HOSPITAL_COMMUNITY)
Admission: EM | Disposition: A | Discharge status: Home Health | Payer: Self-pay | Source: Home / Self Care | Attending: Internal Medicine

## 2012-08-10 ENCOUNTER — Inpatient Hospital Stay (HOSPITAL_COMMUNITY): Payer: Medicaid Other

## 2012-08-10 DIAGNOSIS — J387 Other diseases of larynx: Secondary | ICD-10-CM

## 2012-08-10 DIAGNOSIS — N179 Acute kidney failure, unspecified: Secondary | ICD-10-CM | POA: Diagnosis present

## 2012-08-10 DIAGNOSIS — J449 Chronic obstructive pulmonary disease, unspecified: Secondary | ICD-10-CM

## 2012-08-10 DIAGNOSIS — K449 Diaphragmatic hernia without obstruction or gangrene: Secondary | ICD-10-CM | POA: Diagnosis present

## 2012-08-10 DIAGNOSIS — Z720 Tobacco use: Secondary | ICD-10-CM | POA: Diagnosis present

## 2012-08-10 HISTORY — DX: Diaphragmatic hernia without obstruction or gangrene: K44.9

## 2012-08-10 HISTORY — PX: LARYNGOSCOPY: SHX5203

## 2012-08-10 HISTORY — PX: TRACHEOSTOMY TUBE PLACEMENT: SHX814

## 2012-08-10 HISTORY — DX: Chronic obstructive pulmonary disease, unspecified: J44.9

## 2012-08-10 LAB — BASIC METABOLIC PANEL
BUN: 13 mg/dL (ref 6–23)
Chloride: 106 mEq/L (ref 96–112)
GFR calc Af Amer: 77 mL/min — ABNORMAL LOW (ref >90)
Glucose, Bld: 120 mg/dL — ABNORMAL HIGH (ref 70–99)
Potassium: 3.9 mEq/L (ref 3.5–5.1)
Sodium: 140 mEq/L (ref 135–145)

## 2012-08-10 LAB — HEPATIC FUNCTION PANEL
ALT: 9 U/L (ref 0–35)
AST: 16 U/L (ref 0–37)
Albumin: 3.2 g/dL — ABNORMAL LOW (ref 3.5–5.2)
Bilirubin, Direct: <0.1 mg/dL (ref 0.0–0.3)
Total Bilirubin: 0.3 mg/dL (ref 0.3–1.2)

## 2012-08-10 LAB — CBC
HCT: 34.2 % — ABNORMAL LOW (ref 36.0–46.0)
Hemoglobin: 11.4 g/dL — ABNORMAL LOW (ref 12.0–15.0)
RBC: 4.05 MIL/uL (ref 3.87–5.11)
WBC: 8.4 10*3/uL (ref 4.0–10.5)

## 2012-08-10 SURGERY — CREATION, TRACHEOSTOMY
Anesthesia: General

## 2012-08-10 MED ORDER — PROPOFOL 10 MG/ML IV BOLUS
INTRAVENOUS | Status: DC | PRN
  Administered 2012-08-10: 160 mg via INTRAVENOUS

## 2012-08-10 MED ORDER — HYDROMORPHONE HCL PF 1 MG/ML IJ SOLN
0.5000 mg | INTRAMUSCULAR | Status: DC | PRN
  Administered 2012-08-10 (×2): 0.5 mg via INTRAVENOUS
  Administered 2012-08-10 – 2012-08-11 (×5): 1 mg via INTRAVENOUS
  Administered 2012-08-11: 0.5 mg via INTRAVENOUS
  Administered 2012-08-11 – 2012-08-12 (×4): 1 mg via INTRAVENOUS
  Administered 2012-08-12: 0.5 mg via INTRAVENOUS
  Administered 2012-08-12 (×2): 1 mg via INTRAVENOUS
  Administered 2012-08-13: 0.5 mg via INTRAVENOUS
  Administered 2012-08-13 – 2012-08-14 (×4): 1 mg via INTRAVENOUS
  Filled 2012-08-10 (×17): qty 1

## 2012-08-10 MED ORDER — CEFAZOLIN SODIUM-DEXTROSE 2-3 GM-% IV SOLR
2.0000 g | Freq: Once | INTRAVENOUS | Status: AC
  Administered 2012-08-10: 2 g via INTRAVENOUS
  Filled 2012-08-10: qty 50

## 2012-08-10 MED ORDER — FENTANYL CITRATE 0.05 MG/ML IJ SOLN
INTRAMUSCULAR | Status: DC | PRN
  Administered 2012-08-10: 50 ug via INTRAVENOUS
  Administered 2012-08-10: 100 ug via INTRAVENOUS

## 2012-08-10 MED ORDER — CEFAZOLIN SODIUM-DEXTROSE 2-3 GM-% IV SOLR
INTRAVENOUS | Status: AC
  Filled 2012-08-10: qty 50

## 2012-08-10 MED ORDER — LIDOCAINE-EPINEPHRINE 1 %-1:100000 IJ SOLN
INTRAMUSCULAR | Status: DC | PRN
  Administered 2012-08-10: 3 mL

## 2012-08-10 MED ORDER — IRBESARTAN 150 MG PO TABS
150.0000 mg | ORAL_TABLET | Freq: Every day | ORAL | Status: DC
  Filled 2012-08-10 (×2): qty 1

## 2012-08-10 MED ORDER — OLMESARTAN-AMLODIPINE-HCTZ 20-5-12.5 MG PO TABS: 1.0000 | ORAL_TABLET | Freq: Every day | ORAL | Status: DC

## 2012-08-10 MED ORDER — LACTATED RINGERS IV SOLN
INTRAVENOUS | Status: DC | PRN
  Administered 2012-08-10: 15:00:00 via INTRAVENOUS

## 2012-08-10 MED ORDER — LIDOCAINE-EPINEPHRINE 1 %-1:100000 IJ SOLN
INTRAMUSCULAR | Status: AC
  Filled 2012-08-10: qty 1

## 2012-08-10 MED ORDER — MIDAZOLAM HCL 5 MG/5ML IJ SOLN
INTRAMUSCULAR | Status: DC | PRN
  Administered 2012-08-10 (×2): 1 mg via INTRAVENOUS

## 2012-08-10 MED ORDER — LACTATED RINGERS IV SOLN: INTRAVENOUS | Status: DC

## 2012-08-10 MED ORDER — SUCCINYLCHOLINE CHLORIDE 20 MG/ML IJ SOLN
INTRAMUSCULAR | Status: DC | PRN
  Administered 2012-08-10: 100 mg via INTRAVENOUS

## 2012-08-10 MED ORDER — EPHEDRINE SULFATE 50 MG/ML IJ SOLN
INTRAMUSCULAR | Status: DC | PRN
  Administered 2012-08-10: 10 mg via INTRAVENOUS

## 2012-08-10 MED ORDER — 0.9 % SODIUM CHLORIDE (POUR BTL) OPTIME
TOPICAL | Status: DC | PRN
  Administered 2012-08-10: 1000 mL

## 2012-08-10 MED ORDER — HYDROCHLOROTHIAZIDE 12.5 MG PO CAPS
12.5000 mg | ORAL_CAPSULE | Freq: Every day | ORAL | Status: DC
  Filled 2012-08-10 (×2): qty 1

## 2012-08-10 MED ORDER — HYDROMORPHONE HCL PF 1 MG/ML IJ SOLN
0.2500 mg | INTRAMUSCULAR | Status: DC | PRN
  Administered 2012-08-14: 0.5 mg via INTRAVENOUS
  Filled 2012-08-10 (×4): qty 1

## 2012-08-10 MED ORDER — LIDOCAINE HCL (CARDIAC) 20 MG/ML IV SOLN
INTRAVENOUS | Status: DC | PRN
  Administered 2012-08-10: 80 mg via INTRAVENOUS

## 2012-08-10 MED ORDER — LIDOCAINE HCL 2 % IJ SOLN
2.0000 mL | INTRAMUSCULAR | Status: DC | PRN
  Filled 2012-08-10: qty 1
  Filled 2012-08-10: qty 2

## 2012-08-10 MED ORDER — ENOXAPARIN SODIUM 40 MG/0.4ML ~~LOC~~ SOLN
40.0000 mg | SUBCUTANEOUS | Status: DC
  Administered 2012-08-11 – 2012-08-22 (×11): 40 mg via SUBCUTANEOUS
  Filled 2012-08-10 (×11): qty 0.4

## 2012-08-10 SURGICAL SUPPLY — 39 items
ATTRACTOMAT 16X20 MAGNETIC DRP (DRAPES) ×2 IMPLANT
BAG SPEC THK2 15X12 ZIP CLS (MISCELLANEOUS) ×1
BAG ZIPLOCK 12X15 (MISCELLANEOUS) ×2 IMPLANT
BLADE SURG 15 STRL LF DISP TIS (BLADE) ×1 IMPLANT
BLADE SURG 15 STRL SS (BLADE) ×2
BLADE SURG SZ11 CARB STEEL (BLADE) ×2 IMPLANT
CANISTER SUCTION 2500CC (MISCELLANEOUS) ×2 IMPLANT
CLEANER TIP ELECTROSURG 2X2 (MISCELLANEOUS) ×2 IMPLANT
CLOTH BEACON ORANGE TIMEOUT ST (SAFETY) ×2 IMPLANT
COVER SURGICAL LIGHT HANDLE (MISCELLANEOUS) ×2 IMPLANT
DECANTER SPIKE VIAL GLASS SM (MISCELLANEOUS) ×1 IMPLANT
DISSECTOR ROUND CHERRY 3/8 STR (MISCELLANEOUS) ×1 IMPLANT
DRESSING TELFA 8X3 (GAUZE/BANDAGES/DRESSINGS) ×1 IMPLANT
ELECT COATED BLADE 2.86 ST (ELECTRODE) ×2 IMPLANT
ELECT REM PT RETURN 9FT ADLT (ELECTROSURGICAL) ×2
ELECTRODE REM PT RTRN 9FT ADLT (ELECTROSURGICAL) ×1 IMPLANT
GAUZE SPONGE 4X4 16PLY XRAY LF (GAUZE/BANDAGES/DRESSINGS) ×2 IMPLANT
GAUZE XEROFORM 5X9 LF (GAUZE/BANDAGES/DRESSINGS) IMPLANT
HOLDER TRACH TUBE VELCRO 19.5 (MISCELLANEOUS) ×2 IMPLANT
KIT BASIN OR (CUSTOM PROCEDURE TRAY) ×2 IMPLANT
NDL HYPO 25X1 1.5 SAFETY (NEEDLE) ×1 IMPLANT
NEEDLE HYPO 25X1 1.5 SAFETY (NEEDLE) ×2 IMPLANT
NS IRRIG 1000ML POUR BTL (IV SOLUTION) ×2 IMPLANT
PACK EENT SPLIT (PACKS) ×2 IMPLANT
PENCIL BUTTON HOLSTER BLD 10FT (ELECTRODE) ×2 IMPLANT
SPONGE DRAIN TRACH 4X4 STRL 2S (GAUZE/BANDAGES/DRESSINGS) ×2 IMPLANT
SUT CHROMIC 2 0 SH (SUTURE) ×2 IMPLANT
SUT ETHILON 2 0 PS N (SUTURE) ×4 IMPLANT
SUT ETHILON 3 0 PS 1 (SUTURE) ×1 IMPLANT
SUT SILK 2 0 30  PSL (SUTURE) ×2
SUT SILK 2 0 30 PSL (SUTURE) ×2 IMPLANT
SUT SILK 3 0 (SUTURE) ×2
SUT SILK 3-0 18XBRD TIE 12 (SUTURE) ×1 IMPLANT
SYR 20CC LL (SYRINGE) ×2 IMPLANT
SYR CONTROL 10ML LL (SYRINGE) ×2 IMPLANT
SYRINGE 10CC LL (SYRINGE) ×2 IMPLANT
TUBE TRACH SHILEY  6 DIST  CUF (TUBING) ×1 IMPLANT
WATER STERILE IRR 1500ML POUR (IV SOLUTION) ×2 IMPLANT
YANKAUER SUCT BULB TIP 10FT TU (MISCELLANEOUS) ×2 IMPLANT

## 2012-08-10 NOTE — Brief Op Note (Signed)
08/08/2012 - 08/10/2012  3:46 PM  PATIENT:  [name]  65 y.o. female  PRE-OPERATIVE DIAGNOSIS:  laryngeal mass  POST-OPERATIVE DIAGNOSIS:  larynegeal mass  PROCEDURE:  Procedure(s) (LRB): TRACHEOSTOMY (N/A) LARYNGOSCOPY (N/A)  SURGEON:  Surgeon(s) and Role:    * Jerrell Belfast, MD - Primary  PHYSICIAN ASSISTANT:   ASSISTANTS: none   ANESTHESIA:   general  EBL:  Total I/O In: 600 [I.V.:600] Out: 500 [Urine:500]  EBL: 50 cc  BLOOD ADMINISTERED:none  DRAINS: none   LOCAL MEDICATIONS USED:  LIDOCAINE  and Amount: 3 ml  SPECIMEN:  Source of Specimen:  supraglottic tumor  DISPOSITION OF SPECIMEN:  PATHOLOGY  COUNTS:  YES  TOURNIQUET:  * No tourniquets in log *  DICTATION: .Other Dictation: Dictation Number [id number]  PLAN OF CARE: Admit to inpatient   PATIENT DISPOSITION:  PACU - hemodynamically stable.   Delay start of Pharmacological VTE agent (>24hrs) due to surgical blood loss or risk of bleeding: no

## 2012-08-10 NOTE — Anesthesia Postprocedure Evaluation (Signed)
  Anesthesia Post-op Note  Patient: [name]  Procedure(s) Performed: Procedure(s) (LRB): TRACHEOSTOMY (N/A) LARYNGOSCOPY (N/A)  Patient Location: PACU  Anesthesia Type: General  Level of Consciousness: awake and alert   Airway and Oxygen Therapy: Patient Spontanous Breathing  Post-op Pain: mild  Post-op Assessment: Post-op Vital signs reviewed, Patient's Cardiovascular Status Stable, Respiratory Function Stable, Patent Airway and No signs of Nausea or vomiting  Post-op Vital Signs: stable  Complications: No apparent anesthesia complications. Patient is alert and responding appropriately (movements) to family interaction. Moving all four extremities.

## 2012-08-10 NOTE — Progress Notes (Signed)
ENT Progress Note:  Subjective: Pt stable, cont. stridor  Objective: Vital signs in last 24 hours: Temp:  [98.2 F (36.8 C)-99.2 F (37.3 C)] 98.3 F (36.8 C) (08/28 1200) Pulse Rate:  [53-84] 71  (08/28 1335) Resp:  [13-20] 20  (08/28 1335) BP: (130-185)/(55-81) 185/69 mmHg (08/28 1335) SpO2:  [98 %-100 %] 100 % (08/28 1335) Weight:  [50.6 kg (111 lb 8.8 oz)] 50.6 kg (111 lb 8.8 oz) (08/28 0300) Weight change: -0.5 kg (-1 lb 1.6 oz) Last BM Date: 08/08/12  Intake/Output from previous day: 08/27 0701 - 08/28 0700 In: 1700 [I.V.:1700] Out: 1250 [Urine:1250] Intake/Output this shift: Total I/O In: 600 [I.V.:600] Out: 500 [Urine:500]  Labs:  [facility] 08/10/12 0303 08/09/12 0327  WBC 8.4 9.1  HGB 11.4* 11.3*  HCT 34.2* 34.6*  PLT 269 254    Basename 08/10/12 0303 08/08/12 1840  NA 140 139  K 3.9 4.5  CL 106 101  CO2 27 29  GLUCOSE 120* 102*  BUN 13 31*  CALCIUM 8.9 10.2    Studies/Results: Ct Soft Tissue Neck W Contrast  08/08/2012  *RADIOLOGY REPORT*  Clinical Data: Laryngeal mass partially imaged on a chest CTA earlier today.  CT NECK WITH CONTRAST  Technique:  Multidetector CT imaging of the neck was performed with intravenous contrast.  Contrast: 52mL OMNIPAQUE IOHEXOL 300 MG/ML  SOLN  Comparison: Chest CTA obtained earlier today.  Findings: Again demonstrated is a large mass filling and expanding the larynx and hypopharynx above the level of the true cords.  This is involving the epiglottis and aryepiglottic folds and extending into the vallecula and piriform sinuses, greater on the right. This has a fluid density component on the left and is causing marked airway narrowing.  This mass measures 3.8 x 2.6 cm in maximum dimensions on image number 36 of series 8.  On coronal image number 49 of series 9, this measures 3.2 cm in length.  Also demonstrated is a bilobed or two adjacent lymph nodes with central low density in the lateral neck on the right, anterior to  the sternocleidomastoid muscle.  This measures 1.8 x 1.7 cm in maximum dimensions on image number 55 of series 2 and 2.7 cm in length on coronal image number 46 of series 3.  No additional enlarged lymph nodes are identified.  Mild bullous changes are demonstrated at the lung apices with biapical pleural and parenchymal scarring.  The thyroid gland is unremarkable.  Cervical spine degenerative changes.  IMPRESSION:  1.  3.8 x 3.2 x 2.6 cm laryngeal mass above the level of the true cords, involving the aryepiglottic folds, epiglottis, piriform sinuses and vallecula.  This is necrotic on the left and is causing marked airway narrowing.  This is most compatible with a primary squamous cell carcinoma. 2.  Necrotic metastatic adenopathy on the right, compatible with metastatic squamous cell carcinoma. 3.  COPD.   Original Report Authenticated By: Gerald Stabs, M.D.    Ct Angio Chest Pe W/cm &/or Wo Cm  08/08/2012  *RADIOLOGY REPORT*  Clinical Data: Shortness of breath.  Wheezing.  CT ANGIOGRAPHY CHEST  Technique:  Multidetector CT imaging of the chest using the standard protocol during bolus administration of intravenous contrast. Multiplanar reconstructed images including MIPs were obtained and reviewed to evaluate the vascular anatomy.  Contrast:  78mL OMNIPAQUE IOHEXOL 350 MG/ML SOLN  Comparison: Chest x-ray dated 08/01/2012  Findings: There is a displaced fracture of the lateral aspect of the left sixth rib and a nondisplaced  fracture of the lateral aspect of the left fifth rib. There is also a fracture of the anterior lateral aspect of the left seventh rib.  There is a 4.5 mm diameter cylindrical density in the lateral aspect of the right middle lobe, unchanged since prior CT scan dated 05/02/2003.  This could be a chronically impacted bronchus. This is not felt to be significant.  There is abnormal soft tissue prominence in the larynx just above the true cords asymmetric to the right with marked transverse  narrowing of the airway.  This could represent a mass.  The area is incompletely visualized on the chest CT.  No visible adenopathy.  Heart size is normal. No infiltrates or effusions.  No pulmonary emboli.  Moderate hiatal hernia.  IMPRESSION:  1.  There appears to be a soft tissue mass in the larynx just above the true cords compressing the airway. 2.  Fractures of the lateral aspects of the left fifth, sixth, and seventh ribs. There may be a fracture of the anterior lateral aspect of the left eighth rib as well. 3.  No pulmonary emboli.   Original Report Authenticated By: Larey Seat, M.D.      PHYSICAL EXAM: Airway stable Rt neck mass   Assessment/Plan: Plan DLE, tracheostomy. Plan tx based on path results    Clark, Suly Vukelich 08/10/2012, 2:42 PM

## 2012-08-10 NOTE — Anesthesia Preprocedure Evaluation (Addendum)
Anesthesia Evaluation  Patient identified by MRN, date of birth, ID band Patient awake    Reviewed: Allergy & Precautions, H&P , NPO status , Patient's Chart, lab work & pertinent test results, reviewed documented beta blocker date and time   Airway Mallampati: II TM Distance: >3 FB Neck ROM: full    Dental No notable dental hx.    Pulmonary COPD COPD inhaler, Current Smoker,  Laryngeal mass.  Rib fractures.          Cardiovascular hypertension, Pt. on medications and Pt. on home beta blockers Rhythm:regular Rate:Normal     Neuro/Psych negative neurological ROS  negative psych ROS   GI/Hepatic negative GI ROS, Neg liver ROS, hiatal hernia,   Endo/Other  negative endocrine ROS  Renal/GU negative Renal ROS  negative genitourinary   Musculoskeletal   Abdominal   Peds  Hematology negative hematology ROS (+)   Anesthesia Other Findings   Reproductive/Obstetrics negative OB ROS                          Anesthesia Physical Anesthesia Plan  ASA: IV  Anesthesia Plan: General   Post-op Pain Management:    Induction: Intravenous  Airway Management Planned: Oral ETT  Additional Equipment:   Intra-op Plan:   Post-operative Plan:   Informed Consent: I have reviewed the patients History and Physical, chart, labs and discussed the procedure including the risks, benefits and alternatives for the proposed anesthesia with the patient or authorized representative who has indicated his/her understanding and acceptance.   Dental Advisory Given  Plan Discussed with: CRNA and Surgeon  Anesthesia Plan Comments:         Anesthesia Quick Evaluation

## 2012-08-10 NOTE — Transfer of Care (Signed)
Immediate Anesthesia Transfer of Care Note  Patient: [name]  Procedure(s) Performed: Procedure(s) (LRB): TRACHEOSTOMY (N/A) LARYNGOSCOPY (N/A)  Patient Location: PACU and ICU  Anesthesia Type: General  Level of Consciousness: awake, alert  and oriented  Airway & Oxygen Therapy: Patient Spontanous Breathing and Patient connected to tracheostomy mask oxygen  Post-op Assessment: Post -op Vital signs reviewed and stable and Patient moving all extremities  Post vital signs: Reviewed and stable  Complications: No apparent anesthesia complications

## 2012-08-10 NOTE — Progress Notes (Signed)
[facility] PROGRESS NOTE  [name] [id number] DOB: [birth year] DOA: 08/08/2012 PCP: [name], MD  Assessment/Plan: Principal Problem:  *Laryngeal mass  Patient noted to have a history of progressive hoarseness and weight loss as well as a 2 ppd smoking habit.  CT scans of the chest and neck confirm a laryngeal mass.  Dr. [name] (ENT) saw patient in consultation on 08/09/12 with plans to proceed with diagnostic laryngoscopy, biopsy and placement of a tracheostomy tube for airway management.  Will need oncology and radiation oncology consultations once tissue diagnosis confirmed.  Continue supplemental oxygen.  Continue NPO status. Active Problems:  COPD  Continue supplemental oxygen and nebulized bronchodilator therapy as needed.  Hypertension  Systolic BP in the [id number].  Normally on Tribenzor, but this is currently on hold.  Patient refuses alternative BP medication.  Anxiety  Multiple closed fractures of ribs of left side  Secondary to recent fall from her bed.  Continue pain control efforts.  Tobacco abuse  Counseled on smoking cessation.  Continue nicotine patch.  Hiatal hernia  Found incidentally on CT of the chest.  Acute kidney injury  Likely pre-renal in etiology, creatinine elevation responded to hydration.   Code Status: Full code  Family Communication: Sons at bedside who interpreted and explained plan of care  Disposition Plan: Step down monitoring given risk of airway compromise.    Brief narrative: 66 y/o spanish speaking female originally from [place] came to ED with left-sided chest pain that is worse with movement for one week. She also reports that she has had shortness of breath for the past several months. She admits to a nonproductive cough and weight loss in the past couple months. In the ED a CT angio of the chest was done and showed a soft tissue mass in the larynx just above the true cords compressing the airway.  Fractures of the lateral aspect of the left fifth sixth and seventh ribs were noted. No pulmonary emboli seen. A CT of the soft tissue neck was done and showed a 3.8 x 3.2 x 2.6 cm laryngeal mass above the level of the true cords involving the aryepiglottic folds, epiglottis, piriform sinuses and vallecula. It was noted to be necrotic on the left and causing marked airway narrowing -most compatible with a primary squamous cell cancer. Necrotic metastatic adenopathy on the right compatible with metastatic squamous cell cancer and COPD changes were noted.  Plans are for biopsy of the mass per ENT.  Medical Consultants:  Dr. [name], ENT.  Other Consultants:  Dietician  Procedures:  None.  Direct laryngoscopy with biopsy planned.  Antibiotics:  None  HPI/Subjective: [name] has had some chest discomfort, precipitated by coughing spells.  She denies dyspnea but is noted to have heavy breathing while asleep.    Objective: Filed Vitals:   08/10/12 0300 08/10/12 0400 08/10/12 0531 08/10/12 0600  BP:  158/65  155/57  Pulse:  62 62 53  Temp:  98.2 F (36.8 C)    TempSrc:  Oral    Resp:  17 17 15   Height:      Weight: 50.6 kg (111 lb 8.8 oz)     SpO2:  100% 100% 100%    Intake/Output Summary (Last 24 hours) at 08/10/12 0711 Last data filed at 08/10/12 0600  Gross per 24 hour  Intake   1600 ml  Output   1250 ml  Net    350 ml    Exam: Gen:  NAD, respirations slightly labored. Cardiovascular:  RRR,  No M/R/G Respiratory: Lungs with bilateral rhonchi Gastrointestinal: Abdomen soft, NT/ND with normal active bowel sounds. Extremities: No C/E/C  Data Reviewed: Basic Metabolic Panel:  Lab [id number] 08/09/12 0327 08/08/12 1840  NA 140 -- 139  K 3.9 -- 4.5  CL 106 -- 101  CO2 27 -- 29  GLUCOSE 120* -- 102*  BUN 13 -- 31*  CREATININE 0.89 1.14* 1.21*  CALCIUM 8.9 -- 10.2  MG -- -- --  PHOS -- -- --   GFR Estimated Creatinine Clearance: 44.7 ml/min (by C-G  formula based on Cr of 0.89). Liver Function Tests:  Lab 08/10/12 0303  AST 16  ALT 9  ALKPHOS 82  BILITOT 0.3  PROT 6.1  ALBUMIN 3.2*   CBC:  Lab 08/10/12 0303 08/09/12 0327 08/08/12 1840  WBC 8.4 9.1 10.4  NEUTROABS -- -- 7.0  HGB 11.4* 11.3* 12.5  HCT 34.2* 34.6* 36.6  MCV 84.4 84.2 82.6  PLT 269 254 309   Microbiology Recent Results (from the past 240 hour(s))  MRSA PCR SCREENING     Status: Normal   Collection Time   08/09/12  2:36 AM      Component Value Range Status Comment   MRSA by PCR NEGATIVE  NEGATIVE Final      Studies:  Ct Soft Tissue Neck W Contrast 08/08/2012   IMPRESSION:  1.  3.8 x 3.2 x 2.6 cm laryngeal mass above the level of the true cords, involving the aryepiglottic folds, epiglottis, piriform sinuses and vallecula.  This is necrotic on the left and is causing marked airway narrowing.  This is most compatible with a primary squamous cell carcinoma. 2.  Necrotic metastatic adenopathy on the right, compatible with metastatic squamous cell carcinoma. 3.  COPD.   Original Report Authenticated By: Gerald Stabs, M.D.     Ct Angio Chest Pe W/cm &/or Wo Cm 08/08/2012  IMPRESSION:  1.  There appears to be a soft tissue mass in the larynx just above the true cords compressing the airway. 2.  Fractures of the lateral aspects of the left fifth, sixth, and seventh ribs. There may be a fracture of the anterior lateral aspect of the left eighth rib as well. 3.  No pulmonary emboli.   Original Report Authenticated By: Larey Seat, M.D.     Scheduled Meds:    . amitriptyline  10 mg Oral QHS  . amLODipine  10 mg Oral Daily  . antiseptic oral rinse  15 mL Mouth Rinse BID  . atenolol  25 mg Oral BID  . enoxaparin (LOVENOX) injection  40 mg Subcutaneous Q24H  . feeding supplement  237 mL Oral BID BM  . nicotine  21 mg Transdermal Daily  . pneumococcal 23 valent vaccine  0.5 mL Intramuscular Tomorrow-1000  . DISCONTD: nicotine  14 mg Transdermal Daily    Continuous Infusions:    . dextrose 5 % and 0.9% NaCl 100 mL/hr at 08/10/12 0547  . DISCONTD: sodium chloride 100 mL/hr at 08/09/12 0853    Time spent: 35 minutes.   LOS: 2 days   [name]  [facility] Pager [phone].  If 8PM-8AM, please contact night-coverage at [email or web address], password [facility] 08/10/2012, 7:11 AM

## 2012-08-11 ENCOUNTER — Encounter (HOSPITAL_COMMUNITY): Payer: Self-pay | Admitting: Otolaryngology

## 2012-08-11 ENCOUNTER — Ambulatory Visit
Admit: 2012-08-11 | Discharge: 2012-08-11 | Disposition: A | Discharge status: Home / Self Care | Payer: Medicaid Other | Attending: Radiation Oncology | Admitting: Radiation Oncology

## 2012-08-11 DIAGNOSIS — C321 Malignant neoplasm of supraglottis: Principal | ICD-10-CM

## 2012-08-11 DIAGNOSIS — C329 Malignant neoplasm of larynx, unspecified: Secondary | ICD-10-CM | POA: Diagnosis present

## 2012-08-11 DIAGNOSIS — F17200 Nicotine dependence, unspecified, uncomplicated: Secondary | ICD-10-CM

## 2012-08-11 LAB — BASIC METABOLIC PANEL
BUN: 9 mg/dL (ref 6–23)
CO2: 25 mEq/L (ref 19–32)
Calcium: 8.8 mg/dL (ref 8.4–10.5)
Chloride: 105 mEq/L (ref 96–112)
Creatinine, Ser: 0.85 mg/dL (ref 0.50–1.10)
Glucose, Bld: 127 mg/dL — ABNORMAL HIGH (ref 70–99)

## 2012-08-11 LAB — CBC
HCT: 32.1 % — ABNORMAL LOW (ref 36.0–46.0)
MCH: 28.1 pg (ref 26.0–34.0)
MCHC: 33.6 g/dL (ref 30.0–36.0)
MCV: 83.6 fL (ref 78.0–100.0)
Platelets: 260 10*3/uL (ref 150–400)
RDW: 12.6 % (ref 11.5–15.5)
WBC: 10 10*3/uL (ref 4.0–10.5)

## 2012-08-11 MED ORDER — KETOROLAC TROMETHAMINE 30 MG/ML IJ SOLN
30.0000 mg | Freq: Four times a day (QID) | INTRAMUSCULAR | Status: AC | PRN
  Administered 2012-08-13 – 2012-08-16 (×7): 30 mg via INTRAVENOUS
  Filled 2012-08-11 (×7): qty 1

## 2012-08-11 MED ORDER — HYDROCHLOROTHIAZIDE 12.5 MG PO CAPS
12.5000 mg | ORAL_CAPSULE | Freq: Every day | ORAL | Status: DC
  Administered 2012-08-11 – 2012-08-12 (×2): 12.5 mg via ORAL
  Filled 2012-08-11 (×3): qty 1

## 2012-08-11 MED ORDER — IRBESARTAN 150 MG PO TABS
150.0000 mg | ORAL_TABLET | Freq: Every day | ORAL | Status: DC
  Administered 2012-08-11 – 2012-08-12 (×2): 150 mg via ORAL
  Filled 2012-08-11 (×3): qty 1

## 2012-08-11 MED ORDER — OLMESARTAN-AMLODIPINE-HCTZ 20-5-12.5 MG PO TABS: 1.0000 | ORAL_TABLET | Freq: Every day | ORAL | Status: DC

## 2012-08-11 MED ORDER — FAMOTIDINE IN NACL 20-0.9 MG/50ML-% IV SOLN
20.0000 mg | INTRAVENOUS | Status: DC
  Administered 2012-08-11 – 2012-08-14 (×4): 20 mg via INTRAVENOUS
  Filled 2012-08-11 (×7): qty 50

## 2012-08-11 MED ORDER — CHLORHEXIDINE GLUCONATE 0.12 % MT SOLN
15.0000 mL | Freq: Two times a day (BID) | OROMUCOSAL | Status: DC
  Administered 2012-08-12 – 2012-08-22 (×10): 15 mL via OROMUCOSAL
  Filled 2012-08-11 (×20): qty 15

## 2012-08-11 MED ORDER — HYDRALAZINE HCL 20 MG/ML IJ SOLN
5.0000 mg | Freq: Once | INTRAMUSCULAR | Status: AC
  Administered 2012-08-11: 5 mg via INTRAVENOUS
  Filled 2012-08-11: qty 1

## 2012-08-11 MED ORDER — BIOTENE DRY MOUTH MT LIQD
15.0000 mL | Freq: Two times a day (BID) | OROMUCOSAL | Status: DC
  Administered 2012-08-12 – 2012-08-17 (×7): 15 mL via OROMUCOSAL

## 2012-08-11 NOTE — Progress Notes (Signed)
   ENT Progress Note: POD #1  s/p Procedure(s): TRACHEOSTOMY LARYNGOSCOPY   Subjective: Pt stable, airway intact, mod coughing  Objective: Vital signs in last 24 hours: Temp:  [97.4 F (36.3 C)-99.1 F (37.3 C)] 98.5 F (36.9 C) (08/29 0800) Pulse Rate:  [63-110] 72  (08/29 0723) Resp:  [6-21] 11  (08/29 0723) BP: (103-191)/(54-80) 155/60 mmHg (08/29 0723) SpO2:  [97 %-100 %] 99 % (08/29 0723) FiO2 (%):  [28 %] 28 % (08/29 0723) Weight change:  Last BM Date: 08/08/12  Intake/Output from previous day: 08/28 0701 - 08/29 0700 In: 2800 [I.V.:2800] Out: 1400 [Urine:1400] Intake/Output this shift:    Labs:  [facility] 08/11/12 0313 08/10/12 0303  WBC 10.0 8.4  HGB 10.8* 11.4*  HCT 32.1* 34.2*  PLT 260 269    Basename 08/11/12 0313 08/10/12 0303  NA 138 140  K 3.5 3.9  CL 105 106  CO2 25 27  GLUCOSE 127* 120*  BUN 9 13  CALCIUM 8.8 8.9    Studies/Results: Dg Chest Port 1 View  08/10/2012  *RADIOLOGY REPORT*  Clinical Data: Status post tracheostomy.  PORTABLE CHEST - 1 VIEW  Comparison: CT of the chest 08/08/2012.  Findings: A tracheostomy has been placed.  The tip is at the level the clavicles, well above the carina.  The heart size is normal. New left basilar airspace disease likely reflects atelectasis.  The lung volumes are slightly decreased from prior study.  IMPRESSION:  1.  Interval placement of a tracheostomy tube without radiographic evidence for complication. 2.  New left basilar airspace disease.  This likely reflects atelectasis.  Early infection or aspiration is not excluded.   Original Report Authenticated By: Resa Miner. MATTERN, M.D.      PHYSICAL EXAM: Trach in place Airway stable Min bloody d/c   Assessment/Plan: Path from Bx 8/27 shows invasive SCCA, findings and CT c/w T3N1M? tumor. Findings and results d/w pt's family. Airway stable after trach. Consult MedOnc and RadOnc to cont w/u and treatment plan. Start po fluids and trach cuff  deflation and capping if tolerated. Pt may require PEG tube for feeding and meds. I will be OOT for the weekend and will make [place] [facility] aware.    [place], [name] 08/11/2012, 11:52 AM

## 2012-08-11 NOTE — Op Note (Signed)
[name], HOERIG              ACCOUNT NO.:  [id number]  MEDICAL RECORD NO.:  [id number]  LOCATION:  [id number]                         FACILITY:  [facility]  PHYSICIAN:  Early Chars. [name], M.D.DATE OF BIRTH:  02/02/1946  DATE OF PROCEDURE:  08/10/2012 DATE OF DISCHARGE:                              OPERATIVE REPORT   PREOPERATIVE DIAGNOSIS:  Invasive supraglottic mass with airway restriction.  POSTOPERATIVE DIAGNOSIS:  Invasive supraglottic mass with airway restriction.  INDICATION FOR SURGERY:  Invasive supraglottic mass with airway restriction.  SURGICAL PROCEDURE: 1. Emergency tracheostomy. 2. Direct laryngoscopy and biopsy of supraglottic mass.  ANESTHESIA:  General endotracheal.  SURGEON:  Early Chars. [name], MD  COMPLICATIONS:  None.  ESTIMATED BLOOD LOSS:  Less than 50 mL.   The patient transferred from the operating room to the [facility] Intensive Care Unit in stable condition.  BRIEF HISTORY:  The patient is a 66 year old female, who presented to the [facility] Emergency Department with complaints of right chest pain.  She had fallen and was concerned regarding possible rib fractures.  In addition, the patient has had a 23-month history of progressive hoarseness and mild stridor with chronic low-grade sore throat.  Workup in the emergency room included CT scanning of the neck and chest.  Findings showed nondisplaced rib fractures consistent with the patient's history of trauma to that region and a large invasive supraglottic mass involving the epiglottis and an associated right neck mass.  The patient was a 2-pack per day smoker and retrospect complained of a 75-month history of hoarseness, mild sore throat, and some respiratory difficulty.  Given the patient's history, CT findings and exam which included flexible laryngoscopy showing a large ulcerative mass involving the laryngeal surface of the epiglottis and supraglottic structures.  I recommended  that we undertake emergency airway surgery for tracheostomy and then laryngoscopy and biopsy.  The risks and benefits of the procedure were discussed in detail with the patient and her family.  They understood and concurred with our plan for surgery, which is scheduled for the afternoon of August 08, 2012.  PROCEDURE:  The patient was brought to the operating room at [facility] and placed in supine position on the operating table.  She was positioned and prepped for possible emergency tracheostomy.  The anesthesia staff began induction of general anesthetic, and then on examination which included laryngoscopy, was unable to adequately place in orotracheal breathing tube.  The patient was mask ventilated and safe and stable, but an emergency transcutaneous tracheostomy was then elected to be performed, stabilized the patient's airway.  She was injected with 3 mL of 1% lidocaine with 1:100,000 solution epinephrine. A 2 cm horizontally oriented skin incision was created overlying the patient's airway.  The Bovie electrocautery was used to incise the subcutaneous tissue.  Strap muscles were identified and divided in the midline, and then lateralized.  The patient had a large thyroid isthmus which was divided with Bovie electrocautery.  The anterior aspect of the tracheal compartment was identified and the thyroid lobes were lateralized.  The anterior trachea was then carefully palpated at the 2nd tracheal interspace.  A horizontal incision was created, and the patient's airway was visualized.  Moderate amount of bloody mucus secretions were suctioned from the patient's trachea and a #6 Shiley cuffed tracheostomy tube was then inserted without difficulty.  The tracheostomy tube was sutured to the skin with interrupted 3-0 Ethilon suture and a Velcro trach tie was placed to secure the tracheostomy tube in position.  The patient's airway was stable with good gas exchange.  With the  patient's airway secured, direct laryngoscopy and biopsy were undertaken using a Dedo laryngoscope.  The patient's oral cavity, oropharynx, supraglottis, larynx and hypopharynx were thoroughly examined.  She was found to have a large erosive bloody mass involving the laryngeal surface of the epiglottis, extending to the level of the vocal cords and along the right area epiglottic fold.  The piriform sinuses appeared to be free of tumor bilaterally. Posterior wall of the oropharynx and hypopharynx were all normal.  Multiple biopsies were then taken of the tumor and sent to pathology for gross and microscopic evaluation.  There was minimal bleeding.  An orogastric tube was passed and the stomach contents were aspirated.  The patient was then awakened from anesthetic and was transferred from the operating room to the intensive care unit at [facility] in stable condition.          ______________________________ Early Chars [name], M.D.     DLS/MEDQ  D:  [id number]  T:  08/11/2012  Job:  [id number]

## 2012-08-11 NOTE — Consult Note (Signed)
[place]  [phone] [phone]   [place]    Referral MD:  Jerrell Belfast, M.D.  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   HPI:  [name] is a 66 year-old [name] woman with history of smoking up until this admission.  She has been in usual state of health until about 6 months ago when she started having progressive shortness of breath.  It is slightly worse with activity.  She also has left sided rib pains.  She has about 5-10  Lb of nonintentional weight loss.  She has hoarse voice the last few weeks and also right side cervical neck node.  Her SOB worsened to the point that she needed to presented to ED.  CT angio was negative.  However, there was a suprablottic mass.  This was further worked up with a neck CT on 08/08/2012 which I personally reviewed myself today which showed a large mass filling and expanding the larynx and hypopharynx above the level of the true cords involving the epiglottis and aryepiglottic folds and extending into the vallecula and piriform sinuses, greater on the right. There was marked airway narrowing. This mass measures 3.8 x 2.6 cm x3.2cm (carnial caudally).  She also has two abnormal level III nodes on the right both less than 6cm.  She underwent on 08/10/2012 emergent trach, direct laryngoscopy and biopsy of supraglottic mass.  She was found to have a large erosive bloody mass involving the laryngeal surface of the epiglottis, extending to the level of the vocal cords and along the right area epiglottic fold. The piriform sinuses appeared to be free of tumor bilaterally. Posterior wall of the oropharynx and hypopharynx were all normal. Multiple biopsies were then taken of the tumor with pathology consistent with squamous cell carcinoma.  She was kindly referred to Medical Oncology for consultation.   I visited with the patient in  the hospital room with her son [name] and daughter in Sports coach.  She was only able to node or shake her heads to simple questions due to the trach.  Her family provided the history which was detailed above.   Patient denies fever, fatigue, headache, visual changes, confusion, drenching night sweats, mucositis, odynophagia, dysphagia, nausea vomiting, jaundice, palpitation, gum bleeding, epistaxis, hematemesis, hemoptysis, abdominal pain, abdominal swelling, early satiety, melena, hematochezia, hematuria, skin rash, spontaneous bleeding, joint swelling, joint pain, heat or cold intolerance, bowel bladder incontinence, back pain, focal motor weakness, paresthesia, vaginal bleeding.      Past Medical History  Diagnosis Date  . Hypertension   . Hypercholesteremia   . Anxiety   . Stroke   . Uterine cancer   :  Past Surgical History  Procedure Date  . Abdominal surgery     r/t uterine carcinoma  . Tracheostomy tube placement 08/10/2012    Procedure: TRACHEOSTOMY;  Surgeon: Jerrell Belfast, MD;  Location: [facility];  Service: ENT;  Laterality: N/A;  . Laryngoscopy 08/10/2012    Procedure: LARYNGOSCOPY;  Surgeon: Jerrell Belfast, MD;  Location: [facility];  Service: ENT;  Laterality: N/A;  with biopsy  :  Current Facility-Administered Medications  Medication Dose Route Frequency Provider Last Rate Last Dose  . acetaminophen (TYLENOL) tablet 650 mg  650 mg Oral Q6H PRN Adeline C Viyuoh, MD       Or  . acetaminophen (TYLENOL) suppository 650 mg  650 mg Rectal Q6H PRN Sheila Oats, MD      .  albuterol (PROVENTIL) (5 MG/ML) 0.5% nebulizer solution 2.5 mg  2.5 mg Nebulization Q4H PRN Nishant Dhungel, MD   2.5 mg at 08/10/12 0531  . amitriptyline (ELAVIL) tablet 10 mg  10 mg Oral QHS Adeline C Viyuoh, MD      . antiseptic oral rinse (BIOTENE) solution 15 mL  15 mL Mouth Rinse BID Sheila Oats, MD   15 mL at 08/11/12 1959  . dextrose 5 %-0.9 % sodium chloride infusion   Intravenous Continuous Nishant  Dhungel, MD 100 mL/hr at 08/11/12 0448    . enoxaparin (LOVENOX) injection 40 mg  40 mg Subcutaneous Q24H Jerrell Belfast, MD   40 mg at 08/11/12 1403  . famotidine (PEPCID) IVPB 20 mg  20 mg Intravenous Q24H Venetia Maxon Rama, MD   20 mg at 08/11/12 1405  . feeding supplement (ENSURE COMPLETE) liquid 237 mL  237 mL Oral BID BM Linwood Dibbles, RD   237 mL at 08/09/12 1500  . hydrALAZINE (APRESOLINE) injection 5 mg  5 mg Intravenous Once Ambrose Finland, NP   5 mg at 08/11/12 0143  . hydrochlorothiazide (MICROZIDE) capsule 12.5 mg  12.5 mg Oral Daily Venetia Maxon Rama, MD   12.5 mg at 08/11/12 1835  . HYDROmorphone (DILAUDID) injection 0.25-0.5 mg  0.25-0.5 mg Intravenous Q5 min PRN Peyton Najjar, MD      . HYDROmorphone (DILAUDID) injection 0.5-2 mg  0.5-2 mg Intravenous Q4H PRN Ambrose Finland, NP   1 mg at 08/11/12 2230  . ipratropium (ATROVENT) nebulizer solution 0.5 mg  0.5 mg Nebulization Q6H PRN Nishant Dhungel, MD   0.5 mg at 08/10/12 0531  . irbesartan (AVAPRO) tablet 150 mg  150 mg Oral Daily Venetia Maxon Rama, MD   150 mg at 08/11/12 1835  . ketorolac (TORADOL) 30 MG/ML injection 30 mg  30 mg Intravenous Q6H PRN Christina P Rama, MD      . lidocaine (XYLOCAINE) 2 % (with pres) injection 40 mg  2 mL Other Q4H PRN Jerrell Belfast, MD      . metoprolol (LOPRESSOR) injection 5 mg  5 mg Intravenous Q6H PRN Sheila Oats, MD   5 mg at 08/11/12 1626  . nicotine (NICODERM CQ - dosed in mg/24 hours) patch 21 mg  21 mg Transdermal Daily Nishant Dhungel, MD   21 mg at 08/11/12 1406  . ondansetron (ZOFRAN) tablet 4 mg  4 mg Oral Q6H PRN Adeline C Viyuoh, MD       Or  . ondansetron (ZOFRAN) injection 4 mg  4 mg Intravenous Q6H PRN Adeline C Viyuoh, MD      . DISCONTD: hydrochlorothiazide (MICROZIDE) capsule 12.5 mg  12.5 mg Oral Daily Christina P Rama, MD      . DISCONTD: irbesartan (AVAPRO) tablet 150 mg  150 mg Oral Daily Venetia Maxon Rama, MD      . DISCONTD: Olmesartan-Amlodipine-HCTZ 20-5-12.5  MG TABS 1 tablet  1 tablet Oral Daily Venetia Maxon Rama, MD         No Known Allergies:  No family history on file.:  History   Social History  . Marital Status: Single    Spouse Name: N/A    Number of Children: 4  . Years of Education: N/A   Occupational History  .      retired Regulatory affairs officer   Social History Main Topics  . Smoking status: Current Everyday Smoker  . Smokeless tobacco: Not on file  . Alcohol Use: No  . Drug Use:  No  . Sexually Active:    Other Topics Concern  . Not on file   Social History Narrative  . No narrative on file  :  Pertinent items are noted in HPI.  Exam: Patient Vitals for the past 24 hrs:  BP Temp Temp src Pulse Resp SpO2  08/11/12 2200 170/72 mmHg - - 67  9  99 %  08/11/12 2100 173/61 mmHg - - 66  7  100 %  08/11/12 2005 162/75 mmHg - - 67  13  99 %  08/11/12 2000 - 99.3 F (37.4 C) Oral 70  16  100 %  08/11/12 1900 182/86 mmHg - - 84  25  99 %  08/11/12 1800 180/72 mmHg - - 71  21  99 %  08/11/12 1700 177/72 mmHg - - 70  21  98 %  08/11/12 1600 173/64 mmHg 98.9 F (37.2 C) Oral 66  7  100 %  08/11/12 1538 168/69 mmHg - - 68  15  100 %  08/11/12 1500 168/69 mmHg - - 73  16  100 %  08/11/12 1400 159/61 mmHg - - 69  14  99 %  08/11/12 1300 147/65 mmHg - - 71  15  97 %  08/11/12 1230 - - - 78  23  98 %  08/11/12 1200 168/85 mmHg 98.9 F (37.2 C) Oral 128  27  98 %  08/11/12 1100 163/61 mmHg - - 63  6  99 %  08/11/12 1000 146/63 mmHg - - 67  13  98 %  08/11/12 0900 166/68 mmHg - - 79  21  99 %  08/11/12 0800 142/68 mmHg 98.5 F (36.9 C) Oral 67  6  99 %  08/11/12 0723 155/60 mmHg - - 72  11  99 %  08/11/12 0700 155/60 mmHg - - 75  19  99 %  08/11/12 0600 142/54 mmHg - - 71  6  98 %  08/11/12 0500 181/78 mmHg - - 80  17  99 %  08/11/12 0400 173/76 mmHg 99.1 F (37.3 C) Oral 68  8  99 %  08/11/12 0338 - - - 64  10  99 %  08/11/12 0200 162/69 mmHg - - 65  10  99 %  08/11/12 0143 181/77 mmHg - - 68  10  99 %  08/11/12 0100  172/76 mmHg - - 67  11  99 %  08/11/12 0000 171/80 mmHg - - 72  18  99 %  08/10/12 2330 159/63 mmHg - - 82  17  99 %  08/10/12 2326 - - - 69  16  99 %  08/10/12 2300 170/60 mmHg - - 67  8  98 %  08/10/12 2245 163/57 mmHg - - 70  9  97 %    General:  well-nourished woman in no acute distress.  Eyes:  no scleral icterus.  ENT:  There were no oropharyngeal lesions.  Neck was without thyromegaly.  Trach in place.  Lymphatics:  Negative for supraclavicular or axillary adenopathy.  Positive for a right cervical level III neck node about 3cm.  Respiratory: lungs were clear bilaterally without wheezing or crackles.  Cardiovascular:  Regular rate and rhythm, S1/S2, without murmur, rub or gallop.  There was no pedal edema.  GI:  abdomen was soft, flat, nontender, nondistended, without organomegaly.  Muscoloskeletal:  no spinal tenderness of palpation of vertebral spine. There was some tenderness to palpation of left upper posterior ribs area without  associating skin abnormality.  Skin exam was without echymosis, petichae.     Lab Results  Component Value Date   WBC 10.0 08/11/2012   HGB 10.8* 08/11/2012   HCT 32.1* 08/11/2012   PLT 260 08/11/2012   GLUCOSE 127* 08/11/2012   ALT 9 08/10/2012   AST 16 08/10/2012   NA 138 08/11/2012   K 3.5 08/11/2012   CL 105 08/11/2012   CREATININE 0.85 08/11/2012   BUN 9 08/11/2012   CO2 25 08/11/2012   TSH 0.727 07/09/2010   INR 1.04 10/30/2009   HGBA1C  Value: 6.1 (NOTE)                                                                       According to the ADA Clinical Practice Recommendations for 2011, when HbA1c is used as a screening test:   >=6.5%   Diagnostic of Diabetes Mellitus           (if abnormal result  is confirmed)  5.7-6.4%   Increased risk of developing Diabetes Mellitus  References:Diagnosis and Classification of Diabetes Mellitus,Diabetes [id number] 1):S62-S69 and Standards of Medical Care in         Diabetes - 2011,Diabetes Care,2011,34  (Suppl  1):S11-S61.* 07/09/2010    Ct Soft Tissue Neck W Contrast  08/08/2012  *RADIOLOGY REPORT*  Clinical Data: Laryngeal mass partially imaged on a chest CTA earlier today.  CT NECK WITH CONTRAST  Technique:  Multidetector CT imaging of the neck was performed with intravenous contrast.  Contrast: 29mL OMNIPAQUE IOHEXOL 300 MG/ML  SOLN  Comparison: Chest CTA obtained earlier today.  Findings: Again demonstrated is a large mass filling and expanding the larynx and hypopharynx above the level of the true cords.  This is involving the epiglottis and aryepiglottic folds and extending into the vallecula and piriform sinuses, greater on the right. This has a fluid density component on the left and is causing marked airway narrowing.  This mass measures 3.8 x 2.6 cm in maximum dimensions on image number 36 of series 8.  On coronal image number 49 of series 9, this measures 3.2 cm in length.  Also demonstrated is a bilobed or two adjacent lymph nodes with central low density in the lateral neck on the right, anterior to the sternocleidomastoid muscle.  This measures 1.8 x 1.7 cm in maximum dimensions on image number 55 of series 2 and 2.7 cm in length on coronal image number 46 of series 3.  No additional enlarged lymph nodes are identified.  Mild bullous changes are demonstrated at the lung apices with biapical pleural and parenchymal scarring.  The thyroid gland is unremarkable.  Cervical spine degenerative changes.  IMPRESSION:  1.  3.8 x 3.2 x 2.6 cm laryngeal mass above the level of the true cords, involving the aryepiglottic folds, epiglottis, piriform sinuses and vallecula.  This is necrotic on the left and is causing marked airway narrowing.  This is most compatible with a primary squamous cell carcinoma. 2.  Necrotic metastatic adenopathy on the right, compatible with metastatic squamous cell carcinoma. 3.  COPD.   Original Report Authenticated By: Gerald Stabs, M.D.    Assessment and Plan:   1.  History of  smoking:  I stressed the importance  of smoking cessation to increase chance of cure of her cancer and decrease risk of recurrence.  She is on Nicotine patch.  2.  Hypertension:  She is on HCTZ and Irbesartan.  3.  Depression:  She is on Amitriptyline.   4.  Newly diagnosed cT2 N2b Mx right supraglottic squamous cell carcinoma. - Staging:  Pending out pt PET scan to rule out distant met. - Cause: smoking. - Prognosis and Treatment:  With concurrent chemorad, about 60% chance of laryngetomy free.  If she undergoes upfront laryngectomy, she still needs adjuvant radiation given positive nodes and possibly even adjuvant chemo concurrently if there are high risk features on resection pathology.   Therefore, patient and her relatives had agreed with organ conservation treatment option with concurrent chemorad.  Concurrent chemoradiation will be with daily XRT and once every 3 weeks or weekly Cisplatin for the duration of radiation.  The side effects of cisplatin include but not limited to nausea vomiting, alopecia, fatigue, mucositis, ototoxicity, nephrotoxicity, abnormal electrolytes, cytopenia, risk of bleeding and infection.   There is also an option of induction chemo with Cisplatin/5FU/Taxotore prior to concurrent chemoradiation.  There is no overwhelming survival advantage using induction chemo as opposed to not using it.  However, if patient responds to a few cycles of induction chemo, she will be fore likely to respond to concurrent chemorad.  Thus, response to induction chemo is a predictive indicator to response to concurrent chemorad.  In addition, there may be a slight improvement in distant-free survival.  Induction chemo is with Cisplatin/5FU/Taxotere which can be difficult to tolerate in patients with poor performance status   RECOMMENDATIONS:   - Patient and relative education on trach care prior to d/c home. - Outpt PET scan to rule out distant met (her rib fracture is atypical for this  type of disease). - I made referral to Dr. Enrique Sack for inpatient eval of dental health in anticipation of chemo rad.  - I will discuss the case with Dr. Valere Dross at tumor board for final treatment plan.  - I will follow up with patient upon discharge.     Thank your for this referral.

## 2012-08-11 NOTE — Progress Notes (Signed)
[facility] PROGRESS NOTE  [name] [id number] DOB: Mar 21, 1946 DOA: 08/08/2012 PCP: [name], MD   Brief narrative: 66 y/o spanish speaking female originally from [place] came to ED with left-sided chest pain that is worse with movement for one week. She also reports that she has had shortness of breath for the past several months. She admits to a nonproductive cough and weight loss in the past couple months. In the ED a CT angio of the chest was done and showed a soft tissue mass in the larynx just above the true cords compressing the airway. Fractures of the lateral aspect of the left fifth sixth and seventh ribs were noted. No pulmonary emboli seen. A CT of the soft tissue neck was done and showed a 3.8 x 3.2 x 2.6 cm laryngeal mass above the level of the true cords involving the aryepiglottic folds, epiglottis, piriform sinuses and vallecula. It was noted to be necrotic on the left and causing marked airway narrowing -most compatible with a primary squamous cell cancer. Necrotic metastatic adenopathy on the right compatible with metastatic squamous cell cancer and COPD changes were noted.  She is now status post diagnostic laryngoscopy, biopsy, and tracheostomy tube placement.   Assessment/Plan: Principal Problem:  *Laryngeal mass  Patient noted to have a history of progressive hoarseness and weight loss as well as a 2 ppd smoking habit.  CT scans of the chest and neck confirmed a laryngeal mass.  Dr. [name] (ENT) saw patient in consultation on 08/09/12 and proceeded with diagnostic laryngoscopy, biopsy and placement of a tracheostomy tube for airway management on 08/10/12.  Will need oncology and radiation oncology consultations once tissue diagnosis confirmed.  Continue supplemental oxygen.  Monitor clinical course given post operative X-ray findings suggestive of atelectasis (though aspiration and PNA not excluded). Active Problems:  COPD  Continue supplemental  oxygen and nebulized bronchodilator therapy as needed.  Hypertension  Systolic BP in the [id number].  Normally on Tribenzor, but this is currently on hold.  Can resume when NPO rescinded.  Multiple closed fractures of ribs of left side  Secondary to recent fall from her bed.  Continue pain control efforts.  Tobacco abuse  Counseled on smoking cessation.  Continue nicotine patch.  Hiatal hernia  Found incidentally on CT of the chest.  Acute kidney injury  Likely pre-renal in etiology, creatinine elevation responded to hydration.   Code Status: Full code  Family Communication: Son and daughter at bedside who interpreted and explained plan of care  Disposition Plan: ICU monitoring given risk of airway compromise.   Medical Consultants:  Dr. [name], ENT.  Other Consultants:  Dietician  Procedures:  Direct laryngoscopy with biopsy and tracheostomy tube placement done by Dr. [name] 08/10/12.  Antibiotics:  None  HPI/Subjective: Mrs. Shor has had some back discomfort, precipitated by coughing spells.  She denies dyspnea.  Occasional cough, which causes discomfort in ribs/back.  No nausea or vomiting.  Objective: Filed Vitals:   08/11/12 0600 08/11/12 0700 08/11/12 0723 08/11/12 0800  BP: 142/54 155/60 155/60   Pulse: 71 75 72   Temp:    98.5 F (36.9 C)  TempSrc:    Oral  Resp: 6 19 11    Height:      Weight:      SpO2: 98% 99% 99%     Intake/Output Summary (Last 24 hours) at 08/11/12 0842 Last data filed at 08/11/12 0700  Gross per 24 hour  Intake   2700 ml  Output   1400 ml  Net   1300 ml    Exam: Gen:  NAD Cardiovascular:  RRR, No M/R/G Respiratory: Lungs with la few rhonchi Gastrointestinal: Abdomen soft, NT/ND with normal active bowel sounds. Extremities: No C/E/C  Data Reviewed: Basic Metabolic Panel:  Lab [id number] 08/10/12 0303 08/09/12 0327 08/08/12 1840  NA 138 140 -- 139  K 3.5 3.9 -- --  CL 105 106 -- 101  CO2 25 27 -- 29    GLUCOSE 127* 120* -- 102*  BUN 9 13 -- 31*  CREATININE 0.85 0.89 1.14* 1.21*  CALCIUM 8.8 8.9 -- 10.2  MG -- -- -- --  PHOS -- -- -- --   GFR Estimated Creatinine Clearance: 46.8 ml/min (by C-G formula based on Cr of 0.85). Liver Function Tests:  Lab 08/10/12 0303  AST 16  ALT 9  ALKPHOS 82  BILITOT 0.3  PROT 6.1  ALBUMIN 3.2*   CBC:  Lab 08/11/12 0313 08/10/12 0303 08/09/12 0327 08/08/12 1840  WBC 10.0 8.4 9.1 10.4  NEUTROABS -- -- -- 7.0  HGB 10.8* 11.4* 11.3* 12.5  HCT 32.1* 34.2* 34.6* 36.6  MCV 83.6 84.4 84.2 82.6  PLT 260 269 254 309   Microbiology Recent Results (from the past 240 hour(s))  MRSA PCR SCREENING     Status: Normal   Collection Time   08/09/12  2:36 AM      Component Value Range Status Comment   MRSA by PCR NEGATIVE  NEGATIVE Final      Studies:  Ct Soft Tissue Neck W Contrast 08/08/2012   IMPRESSION:  1.  3.8 x 3.2 x 2.6 cm laryngeal mass above the level of the true cords, involving the aryepiglottic folds, epiglottis, piriform sinuses and vallecula.  This is necrotic on the left and is causing marked airway narrowing.  This is most compatible with a primary squamous cell carcinoma. 2.  Necrotic metastatic adenopathy on the right, compatible with metastatic squamous cell carcinoma. 3.  COPD.   Original Report Authenticated By: Gerald Stabs, M.D.     Ct Angio Chest Pe W/cm &/or Wo Cm 08/08/2012  IMPRESSION:  1.  There appears to be a soft tissue mass in the larynx just above the true cords compressing the airway. 2.  Fractures of the lateral aspects of the left fifth, sixth, and seventh ribs. There may be a fracture of the anterior lateral aspect of the left eighth rib as well. 3.  No pulmonary emboli.   Original Report Authenticated By: Larey Seat, M.D.     Dg Chest Port 1 View 08/10/2012  IMPRESSION:  1.  Interval placement of a tracheostomy tube without radiographic evidence for complication. 2.  New left basilar airspace disease.  This  likely reflects atelectasis.  Early infection or aspiration is not excluded.   Original Report Authenticated By: Resa Miner. MATTERN, M.D.    Scheduled Meds:    . amitriptyline  10 mg Oral QHS  . antiseptic oral rinse  15 mL Mouth Rinse BID  .  ceFAZolin (ANCEF) IV  2 g Intravenous Once  . enoxaparin (LOVENOX) injection  40 mg Subcutaneous Q24H  . feeding supplement  237 mL Oral BID BM  . hydrALAZINE  5 mg Intravenous Once  . hydrochlorothiazide  12.5 mg Oral Daily  . irbesartan  150 mg Oral Daily  . nicotine  21 mg Transdermal Daily  . pneumococcal 23 valent vaccine  0.5 mL Intramuscular Tomorrow-1000  . DISCONTD: amLODipine  10 mg Oral Daily  . DISCONTD:  atenolol  25 mg Oral BID  . DISCONTD: enoxaparin (LOVENOX) injection  40 mg Subcutaneous Q24H  . DISCONTD: Olmesartan-Amlodipine-HCTZ  1 tablet Oral Daily   Continuous Infusions:    . dextrose 5 % and 0.9% NaCl 100 mL/hr at 08/11/12 0448  . DISCONTD: lactated ringers    . DISCONTD: lactated ringers    . DISCONTD: lactated ringers    . DISCONTD: lactated ringers      Time spent: 35 minutes.   LOS: 3 days   [facility] Pager [phone].  If 8PM-8AM, please contact night-coverage at [email or web address], password [facility] 08/11/2012, 8:42 AM

## 2012-08-11 NOTE — Consult Note (Signed)
[facility] NEW PATIENT EVALUATION  Name: [name] MRN: [id number]  Date:   08/08/2012           DOB: 22-Oct-1946  Status: inpatient   CC: Ann Gravel, MD  Dr. Jacquelynn Shaw, Dr. Sherryl Shaw, Dr. Jorja Shaw   REFERRING PHYSICIAN: Dr. 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 female who is seen today for the courtesy of Dr. Margreta Journey Shaw for evaluation of her stage IV (T2 N2 MX) squamous cell carcinoma of the supraglottic larynx. She presented Dr. Maudie Shaw with a history of left-sided chest pain after a fall the previous week. I understand that initial chest x-rays were unremarkable. Her pain worsened and she was seen in the emergency department where she was also noted to have shortness of breath and stridor. Her CT scan of the neck on 08/08/2012 showed a 3.8 x 3.2 x 2.67 m laryngeal mass above the level of 2 cores involving the AE folds, epiglottis, perform sinus is and vallecula. There was also necrotic adenopathy along the right neck with either 2 adjacent lymph nodes or a bilobed node measure 1.8 x 1.7 by 2.7 cm. Scans of the chest showed fractures of the lateral aspects of the left fifth, sixth, seventh, and eighth ribs. She was seen by Dr. Wilburn Shaw who performed an urgent tracheostomy on 08/10/2012 at which time he noted a large erosive bloody mass involving the laryngeal surface of the epiglottis extending to the level of the vocal cords and along the right AE fold. The performed sinuses appear to be free of tumor bilaterally in the posterior wall of the oropharynx and hypopharynx were also normal. A biopsy was diagnostic for invasive squamous cell carcinoma. I understand that she was seen here today by Dr. Lamonte Shaw of medical oncology. Her family states that she is lost approximately 7 pounds over the past 3 months. She never complained of a sore throat.  She has been a 2 pack per day smoker for approximately 50 years according to her son [name]. Her son states that he first noted a "raspy throat" approximately 3 months ago. She speaks and understands very little [place]. She has 2 sons, [name] and [name] who are able to translate for her.  PREVIOUS RADIATION THERAPY: No   PAST MEDICAL HISTORY:  has a past medical history of Hypertension; Hypercholesteremia; Anxiety; Stroke; and Uterine cancer.     PAST SURGICAL HISTORY:  Past Surgical History  Procedure Date  . Abdominal surgery     r/t uterine carcinoma  . Tracheostomy tube placement 08/10/2012    Procedure: TRACHEOSTOMY;  Surgeon: Ann Belfast, MD;  Location: [facility];  Service: ENT;  Laterality: N/A;  . Laryngoscopy 08/10/2012    Procedure: LARYNGOSCOPY;  Surgeon: Ann Belfast, MD;  Location: [facility];  Service: ENT;  Laterality: N/A;  with biopsy     FAMILY HISTORY: family history is not on file.   SOCIAL HISTORY:  reports that she has been smoking.  She does not have any smokeless tobacco history on file. She reports that she does not drink alcohol or use illicit drugs. Widowed for the past 1-1/2 years, 3 children. 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  ALLERGIES: Review of patient's allergies indicates no known allergies.   MEDICATIONS:  Current Facility-Administered Medications  Medication Dose Route Frequency  Provider Last Rate Last Dose  . acetaminophen (TYLENOL) tablet 650 mg  650 mg Oral Q6H PRN Ann Oats, MD       Or  . acetaminophen (TYLENOL) suppository 650 mg  650 mg Rectal Q6H PRN Ann C Viyuoh, MD      . albuterol (PROVENTIL) (5 MG/ML) 0.5% nebulizer solution 2.5 mg  2.5 mg Nebulization Q4H PRN Ann Dhungel, MD   2.5 mg at 08/10/12 0531  . amitriptyline (ELAVIL) tablet 10 mg  10 mg Oral QHS Ann C Viyuoh, MD      . antiseptic oral rinse (BIOTENE) solution 15 mL  15 mL  Mouth Rinse BID Ann C Viyuoh, MD   15 mL at 08/11/12 0800  . dextrose 5 %-0.9 % sodium chloride infusion   Intravenous Continuous Ann Dhungel, MD 100 mL/hr at 08/11/12 0448    . enoxaparin (LOVENOX) injection 40 mg  40 mg Subcutaneous Q24H Ann Belfast, MD   40 mg at 08/11/12 1403  . famotidine (PEPCID) IVPB 20 mg  20 mg Intravenous Q24H Ann Maxon Rama, MD   20 mg at 08/11/12 1405  . feeding supplement (ENSURE COMPLETE) liquid 237 mL  237 mL Oral BID BM [name], RD   237 mL at 08/09/12 1500  . hydrALAZINE (APRESOLINE) injection 5 mg  5 mg Intravenous Once Ann Finland, NP   5 mg at 08/11/12 0143  . hydrochlorothiazide (MICROZIDE) capsule 12.5 mg  12.5 mg Oral Daily Ann P Rama, MD      . HYDROmorphone (DILAUDID) injection 0.25-0.5 mg  0.25-0.5 mg Intravenous Q5 min PRN Ann Najjar, MD      . HYDROmorphone (DILAUDID) injection 0.5-2 mg  0.5-2 mg Intravenous Q4H PRN Ann Finland, NP   1 mg at 08/11/12 1305  . ipratropium (ATROVENT) nebulizer solution 0.5 mg  0.5 mg Nebulization Q6H PRN Ann Dhungel, MD   0.5 mg at 08/10/12 0531  . irbesartan (AVAPRO) tablet 150 mg  150 mg Oral Daily Ann P Rama, MD      . ketorolac (TORADOL) 30 MG/ML injection 30 mg  30 mg Intravenous Q6H PRN Ann P Rama, MD      . lidocaine (XYLOCAINE) 2 % (with pres) injection 40 mg  2 mL Other Q4H PRN Ann Belfast, MD      . metoprolol (LOPRESSOR) injection 5 mg  5 mg Intravenous Q6H PRN Ann C Viyuoh, MD   5 mg at 08/11/12 1626  . nicotine (NICODERM CQ - dosed in mg/24 hours) patch 21 mg  21 mg Transdermal Daily Ann Dhungel, MD   21 mg at 08/11/12 1406  . ondansetron (ZOFRAN) tablet 4 mg  4 mg Oral Q6H PRN Ann C Viyuoh, MD       Or  . ondansetron (ZOFRAN) injection 4 mg  4 mg Intravenous Q6H PRN Ann C Viyuoh, MD      . DISCONTD: hydrochlorothiazide (MICROZIDE) capsule 12.5 mg  12.5 mg Oral Daily Ann P Rama, MD      . DISCONTD: irbesartan (AVAPRO) tablet 150  mg  150 mg Oral Daily Ann Maxon Rama, MD      . DISCONTD: Olmesartan-Amlodipine-HCTZ 20-5-12.5 MG TABS 1 tablet  1 tablet Oral Daily Ann Maxon Rama, MD         REVIEW OF SYSTEMS:  Pertinent items are noted in HPI.    PHYSICAL EXAM:  height is 5' (1.524 m) and weight is 111 lb 8.8 oz (50.6 kg). Her oral temperature is 98.9 F (  37.2 C). Her blood pressure is 177/72 and her pulse is 70. Her respiration is 21 and oxygen saturation is 98%.   Alert and oriented 66 year old female appearing her stated age. Nodes: There is a palpable 2.5 cm level III node on the right. There is no other palpable lymphadenopathy. The tracheostomy site appears to be clean. The remainder of her head neck examination will be performed when she visits  our Department.   LABORATORY DATA:  Lab Results  Component Value Date   WBC 10.0 08/11/2012   HGB 10.8* 08/11/2012   HCT 32.1* 08/11/2012   MCV 83.6 08/11/2012   PLT 260 08/11/2012   Lab Results  Component Value Date   NA 138 08/11/2012   K 3.5 08/11/2012   CL 105 08/11/2012   CO2 25 08/11/2012   Lab Results  Component Value Date   ALT 9 08/10/2012   AST 16 08/10/2012   ALKPHOS 82 08/10/2012   BILITOT 0.3 08/10/2012      IMPRESSION: Stage IV (T2 N2 MX) squamous cell carcinoma of the larynx, supraglottic larynx, laryngeal surface of the epiglottis. It is unclear to me whether not she has a T2 or T3 primary tumor, but I would still consider laryngeal preservation in this situation. We obviously need to obtain a PET scan to complete her staging workup. I'm not sure if this would be done as an inpatient or after she is discharged by early to mid next week. She will also need dental consultation with Dr. Jorja Shaw since he apparently has upper and lower dental implants. Following her PET scan, and dental evaluation, we can proceed with CT simulation and then initiation of external beam/IMRT 7-10 days later. Ideally, she should receive chemotherapy with her external  beam/IMRT. An alternative approach be to give her one to 2 cycles of neoadjuvant chemotherapy to decrease her laryngeal mass and then proceed with CT simulation. I discussed the potential acute and late toxicities of radiation therapy with her son, [name] and he strongly desires  to proceed with laryngeal preservation if all possible. I need to share my thoughts with Dr. Lamonte Shaw, and I await his recommendations.   PLAN: As discussed above.   I spent 40 minutes minutes face to face with the patient and more than 50% of that time was spent in counseling and/or coordination of care.

## 2012-08-12 ENCOUNTER — Encounter (HOSPITAL_COMMUNITY): Payer: Self-pay | Admitting: Internal Medicine

## 2012-08-12 DIAGNOSIS — E43 Unspecified severe protein-calorie malnutrition: Secondary | ICD-10-CM

## 2012-08-12 DIAGNOSIS — D649 Anemia, unspecified: Secondary | ICD-10-CM | POA: Diagnosis present

## 2012-08-12 LAB — BASIC METABOLIC PANEL
BUN: 6 mg/dL (ref 6–23)
Calcium: 8.7 mg/dL (ref 8.4–10.5)
Chloride: 104 mEq/L (ref 96–112)
Creatinine, Ser: 0.86 mg/dL (ref 0.50–1.10)
GFR calc Af Amer: 80 mL/min — ABNORMAL LOW (ref >90)
GFR calc non Af Amer: 69 mL/min — ABNORMAL LOW (ref >90)

## 2012-08-12 LAB — CBC
HCT: 30 % — ABNORMAL LOW (ref 36.0–46.0)
MCHC: 33 g/dL (ref 30.0–36.0)
Platelets: 240 10*3/uL (ref 150–400)
RDW: 12.5 % (ref 11.5–15.5)
WBC: 9.9 10*3/uL (ref 4.0–10.5)

## 2012-08-12 NOTE — Progress Notes (Signed)
Family reports that pt   C/O pain and burning in rt breast that occurs when she gets up to [facility]. They reported that this in turn makes her nauseated but pain improves when she gets back in bed. Lionel December

## 2012-08-12 NOTE — Progress Notes (Signed)
[facility] PROGRESS NOTE  [name] [id number] DOB: [birth year] DOA: 08/08/2012 PCP: [name], MD   Brief narrative: 66 y/o spanish speaking female, originally from [place], who was admitted on 08/08/12 with left sided chest pain, dyspnea, NP cough and weight loss.  In the ED a CT angio of the chest was done and showed a soft tissue mass in the larynx just above the true cords compressing the airway. Fractures of the lateral aspect of the left fifth sixth and seventh ribs were noted.  A CT of the soft tissue neck was done and showed a 3.8 x 3.2 x 2.6 cm laryngeal mass above the level of the true cords involving the aryepiglottic folds, epiglottis, piriform sinuses and vallecula. It was noted to be necrotic on the left and causing marked airway narrowing -most compatible with a primary squamous cell cancer. Necrotic metastatic adenopathy on the right compatible with metastatic squamous cell cancer and COPD changes were noted.  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  She has been seen by radiation oncology and oncology with plans to proceed with larynx sparing therapy as an outpatient.   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  CT scans of the chest and neck confirmed a laryngeal mass.  Dr. [name] (ENT) saw patient in consultation on 08/09/12 and proceeded with diagnostic laryngoscopy, biopsy and placement of a tracheostomy tube for airway management on 08/10/12.  Biopsies confirmed SCC.  Continue supplemental oxygen.  Diet advanced, monitor intake, may need PEG for nutrition.  Monitor clinical course given post operative X-ray findings suggestive of atelectasis (though aspiration and PNA not  excluded).  Call placed to Dr. Enrique Sack for consultation regarding dental implants and how these might impact her therapy. Active Problems:  Severe malnutrition secondary to acute illness  PO intake remains poor and is not likely to improve given need for chemo/XRT.  For ST/swallowing evaluation today.  May need PEG, spoke with daughter about this, who wants to see how she does today before agreeing to this.  Normocytic anemia  Likely multifactorial with AOCD and surgical losses contributory.  COPD  Continue supplemental oxygen and nebulized bronchodilator therapy as needed.  Hypertension  Systolic BP in the [id number]. Tribenzor resumed.  Continue lopressor IV PRN.  Multiple closed fractures of ribs of left side  Secondary to recent fall from her bed.  Continue pain control efforts.  Tobacco abuse  Counseled on smoking cessation.  Continue nicotine patch.  Hiatal hernia  Found incidentally on CT of the chest.  Acute kidney injury  Likely pre-renal in etiology, creatinine elevation responded to hydration.   Code Status: Full code  Family Communication: Daughter updated. Disposition Plan: ICU monitoring given risk of airway compromise.   Medical Consultants:  Dr. [name], ENT.  Dr. Arloa Koh, Radiation Oncology  Dr. Sherryl Manges, Oncology  Other Consultants:  Dietician: Ensure, magic cup ordered.  Procedures:  Direct laryngoscopy with biopsy and tracheostomy tube placement done by Dr. [name] 08/10/12.  Antibiotics:  None  HPI/Subjective: [name] denies dyspnea.  Continues to have occasional cough, back/chest pain.  Some nausea and vomiting last night, but none so far today.  Objective: Filed Vitals:   08/12/12 0400 08/12/12 0500 08/12/12 0600 08/12/12 0635  BP: 141/60 162/68 145/71 164/57  Pulse: 72 71 67 77  Temp:      TempSrc:      Resp: 18 13 13 22   Height:      Weight:      SpO2: 98% 99% 100% 99%    Intake/Output Summary (Last 24  hours) at 08/12/12 0710 Last data filed at 08/12/12 0200  Gross per 24 hour  Intake   1922 ml  Output    900 ml  Net   1022 ml    Exam: Gen:  NAD Cardiovascular:  RRR, No M/R/G Respiratory: Lungs CTAB Gastrointestinal: Abdomen soft, NT/ND with normal active bowel sounds. Extremities: No C/E/C  Data Reviewed: Basic Metabolic Panel:  Lab [id number] 0313 08/11/12 0313 08/10/12 0303 08/09/12 0327 08/08/12 1840  NA 137 138 140 -- 139  K 3.5 3.5 -- -- --  CL 104 105 106 -- 101  CO2 26 25 27  -- 29  GLUCOSE 129* 127* 120* -- 102*  BUN 6 9 13  -- 31*  CREATININE 0.86 0.85 0.89 1.14* 1.21*  CALCIUM 8.7 8.8 8.9 -- 10.2  MG -- -- -- -- --  PHOS -- -- -- -- --   GFR Estimated Creatinine Clearance: 46.2 ml/min (by C-G formula based on Cr of 0.86). Liver Function Tests:  Lab 08/10/12 0303  AST 16  ALT 9  ALKPHOS 82  BILITOT 0.3  PROT 6.1  ALBUMIN 3.2*   CBC:  Lab 08/12/12 0313 08/11/12 0313 08/10/12 0303 08/09/12 0327 08/08/12 1840  WBC 9.9 10.0 8.4 9.1 10.4  NEUTROABS -- -- -- -- 7.0  HGB 9.9* 10.8* 11.4* 11.3* 12.5  HCT 30.0* 32.1* 34.2* 34.6* 36.6  MCV 83.8 83.6 84.4 84.2 82.6  PLT 240 260 269 254 309   Microbiology Recent Results (from the past 240 hour(s))  MRSA PCR SCREENING     Status: Normal   Collection Time   08/09/12  2:36 AM      Component Value Range Status Comment   MRSA by PCR NEGATIVE  NEGATIVE Final      Studies:  Ct Soft Tissue Neck W Contrast 08/08/2012   IMPRESSION:  1.  3.8 x 3.2 x 2.6 cm laryngeal mass above the level of the true cords, involving the aryepiglottic folds, epiglottis, piriform sinuses and vallecula.  This is necrotic on the left and is causing marked airway narrowing.  This is most compatible with a primary squamous cell carcinoma. 2.  Necrotic metastatic adenopathy on the right, compatible with metastatic squamous cell carcinoma. 3.  COPD.   Original Report Authenticated By: Gerald Stabs, M.D.     Ct Angio Chest Pe W/cm &/or Wo Cm  08/08/2012  IMPRESSION:  1.  There appears to be a soft tissue mass in the larynx just above the true cords compressing the airway. 2.  Fractures of the lateral aspects of the left fifth, sixth, and seventh ribs. There may be a fracture of the anterior lateral aspect of the left eighth rib as well. 3.  No pulmonary emboli.   Original Report Authenticated By: Larey Seat, M.D.     Dg Chest Port 1 View 08/10/2012  IMPRESSION:  1.  Interval placement of a tracheostomy tube without radiographic evidence for complication. 2.  New left basilar airspace disease.  This likely reflects atelectasis.  Early infection or aspiration is not excluded.   Original Report Authenticated By: Resa Miner. MATTERN, M.D.    Scheduled Meds:    . amitriptyline  10 mg Oral QHS  . antiseptic oral  rinse  15 mL Mouth Rinse q12n4p  . chlorhexidine  15 mL Mouth Rinse BID  . enoxaparin (LOVENOX) injection  40 mg Subcutaneous Q24H  . famotidine (PEPCID) IV  20 mg Intravenous Q24H  . feeding supplement  237 mL Oral BID BM  . hydrochlorothiazide  12.5 mg Oral Daily  . irbesartan  150 mg Oral Daily  . nicotine  21 mg Transdermal Daily  . DISCONTD: antiseptic oral rinse  15 mL Mouth Rinse BID  . DISCONTD: hydrochlorothiazide  12.5 mg Oral Daily  . DISCONTD: irbesartan  150 mg Oral Daily  . DISCONTD: Olmesartan-Amlodipine-HCTZ  1 tablet Oral Daily   Continuous Infusions:    . dextrose 5 % and 0.9% NaCl 100 mL/hr (08/12/12 0049)    Time spent: 35 minutes.   LOS: 4 days   [facility] Pager [phone].  If 8PM-8AM, please contact night-coverage at [email or web address], password [facility] 08/12/2012, 7:10 AM

## 2012-08-12 NOTE — Progress Notes (Signed)
Pt c/o burning sensation in R breast when stands. Does not feel any discomfort in breast when sitting or laying; denies any discomfort in L breast at any time. Pt wants MD to address in morning. I will pass info to dayshift RN.

## 2012-08-12 NOTE — Progress Notes (Signed)
CARE MANAGEMENT NOTE 08/12/2012  Patient:  [name], [name]   Account Number:  [id number]  Date Initiated:  08/12/2012  Documentation initiated by:  DAVIS,RHONDA  Subjective/Objective Assessment:   status post diagnostic laryngoscopy, biopsy, and tracheostomy tube placement, and carries a biopsy proven diagnosis of right supraglottic SCC.  She has been seen by radiation oncology and oncology with plans to proceed with larynx sparing t     Action/Plan:   home   Anticipated DC Date:  08/13/2012   Anticipated DC Plan:  HOME/SELF CARE  In-house referral  NA      DC Planning Services  NA      [facility] Choice  NA   Choice offered to / List presented to:  NA   DME arranged  NA      DME agency  NA     [place] arranged  NA      [place] agency  NA   Status of service:  In process, will continue to follow Medicare Important Message given?  NA - LOS <3 / Initial given by admissions (If response is "NO", the following Medicare IM given date fields will be blank) Date Medicare IM given:   Date Additional Medicare IM given:    Discharge Disposition:    Per UR Regulation:  Reviewed for med. necessity/level of care/duration of stay  If discussed at [place] of Stay Meetings, dates discussed:    Comments:  [id number]/Rhonda Rosana Hoes, RN, BSN, CCM: CHART REVIEWED AND UPDATED. NO DISCHARGE NEEDS PRESENT AT THIS TIME. CASE MANAGEMENT [phone]

## 2012-08-12 NOTE — Progress Notes (Signed)
Worked with speech therapist on capping trial. Trach plugged off but very little air felt from oral cavity and pt. Could not speak. Trach cuff left deflated but passy muir valve placed on hold.

## 2012-08-12 NOTE — Evaluation (Signed)
Passy-Muir Speaking Valve - Evaluation Patient Details  Name: [name] MRN: [id number] Date of Birth: [birth year]  Today's Date: 08/12/2012 Time: [id number] SLP Time Calculation (min): 14 min  Past Medical History:  Past Medical History  Diagnosis Date  . Hypertension   . Hypercholesteremia   . Anxiety   . Stroke   . Uterine cancer   . COPD (chronic obstructive pulmonary disease) 08/10/2012  . Hiatal hernia 08/10/2012  . 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 Abdominal surgery     r/t uterine carcinoma  . Tracheostomy tube placement 08/10/2012    Procedure: TRACHEOSTOMY;  Surgeon: Jerrell Belfast, MD;  Location: [facility];  Service: ENT;  Laterality: N/A;  . Laryngoscopy 08/10/2012    Procedure: LARYNGOSCOPY;  Surgeon: Jerrell Belfast, MD;  Location: [facility];  Service: ENT;  Laterality: N/A;  with biopsy   HPI:  66 y/o spanish speaking female, originally from [place], who was admitted on 08/08/12 with left sided chest pain, dyspnea, NP cough and weight loss.  In the ED a CT angio of the chest was done and showed a soft tissue mass in the larynx just above the true cords compressing the airway. Fractures of the lateral aspect of the left fifth sixth and seventh ribs were noted.  A CT of the soft tissue neck was done and showed a 3.8 x 3.2 x 2.6 cm laryngeal mass above the level of the true cords involving the aryepiglottic folds, epiglottis, piriform sinuses and vallecula. It was noted to be necrotic on the left and causing marked airway narrowing -most compatible with a primary squamous cell cancer. Necrotic metastatic adenopathy on the right compatible with metastatic squamous cell cancer and COPD changes were noted.  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  ENT wrote for cuff deflation  and PMSV as tolerated.     Assessment / Plan / Recommendation Clinical Impression  Currently pt is unable to redirect adequate amount of subglottic air to upper airway with finger occlusion on 3 separate attempts with verbal countdown for pt preparation.  RT present and tracheally suctioned small amount of secretions prior to and after complete cuff deflation.  In addition, pt was aphonic with phonation attempts and indicated respiratory difficulties x1 with 3rd attempt.    Hopeful for component of edema and secretion stasis impacting pt's phonatory ability but concern for impact of laryngeal mass on air passage and phonatory ability.  Asked RT if pt could keep could keep cuff deflated and she advised it was acceptable.  Thankful for RT assist.  SLP provided Spanish communication board and pt demonstrated adequate use of board.    SLP to follow up on Monday to reattempt PMSV.  Advised family and pt to goals being for secretions to decrease, phonatory ability with finger occlusion of trach to determine readiness for PMSV trial.  Family and pt indicated understanding of information provided.    Thanks for this consult.       SLP Assessment  Patient needs continued Speech Lanaguage Pathology Services    Follow Up Recommendations       Frequency and Duration min 2x/week  2 weeks   Pertinent Vitals/Pain Afebrile, decreased    SLP Goals Potential Considerations: Other (comment) (tumor and raspy voice at baseline ) SLP Goal #1: Pt will demonstrate ability to phonate with tracheal  finger occlusion by SLP with mod assist to aid in determining readiness for PMSV trial.    PMSV Trial  PMSV was placed for: PMSV not placed due to poor ability to redirect subglottic air through upper airway and pt being aphonic.   Able to redirect subglottic air through upper airway: No (minimal amount of air noted via oral cavity by RT) Able to Attain Phonation: No Able to Expectorate Secretions: No Respirations  During Trial: 20  (at rest) SpO2 During Trial: 99 % (at rest) Pulse During Trial: 77  (at rest)   Tracheostomy Tube    #6 Shiley-cuffed   Vent Dependency  FiO2 (%): 28 %    Cuff Deflation Trial Tolerated Cuff Deflation: Yes Length of Time for Cuff Deflation Trial: Sherri, respiratory came into room to deflate cuff on trach-suctioning pt both before and after cuff deflation Behavior: Responsive to questions;Smiling;Good eye contact;Alert (family in room to interpret) Cuff Deflation Trial - Comments: tolerated cuff deflation well with all vital signs stable   Luanna Salk, [facility] SLP [phone]

## 2012-08-13 LAB — BASIC METABOLIC PANEL
BUN: 5 mg/dL — ABNORMAL LOW (ref 6–23)
Chloride: 106 mEq/L (ref 96–112)
Creatinine, Ser: 0.85 mg/dL (ref 0.50–1.10)
GFR calc Af Amer: 81 mL/min — ABNORMAL LOW (ref >90)
GFR calc non Af Amer: 70 mL/min — ABNORMAL LOW (ref >90)
Potassium: 3.7 mEq/L (ref 3.5–5.1)

## 2012-08-13 LAB — CBC
Hemoglobin: 9.6 g/dL — ABNORMAL LOW (ref 12.0–15.0)
MCHC: 33.6 g/dL (ref 30.0–36.0)
RDW: 12.6 % (ref 11.5–15.5)
WBC: 8.4 10*3/uL (ref 4.0–10.5)

## 2012-08-13 MED ORDER — OLMESARTAN-AMLODIPINE-HCTZ 40-5-25 MG PO TABS
0.5000 | ORAL_TABLET | Freq: Every day | ORAL | Status: DC
  Administered 2012-08-13 – 2012-08-14 (×2): 0.5 via ORAL
  Administered 2012-08-15: 10:00:00 via ORAL
  Administered 2012-08-16 – 2012-08-22 (×6): 0.5 via ORAL

## 2012-08-13 MED ORDER — OLMESARTAN-AMLODIPINE-HCTZ 20-5-12.5 MG PO TABS: 1.0000 | ORAL_TABLET | Freq: Every day | ORAL | Status: DC

## 2012-08-13 NOTE — Progress Notes (Signed)
[facility] Speaking Valve - Treatment Patient Details  Name: [name] MRN: [id number] Date of Birth: May 28, 1946  Today's Date: 08/13/2012 Time: [id number] SLP Time Calculation (min): 21 min  Past Medical History:  Past Medical History  Diagnosis Date  . Hypertension   . Hypercholesteremia   . Anxiety   . Stroke   . Uterine cancer   . COPD (chronic obstructive pulmonary disease) 08/10/2012  . Hiatal hernia 08/10/2012  . 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 Abdominal surgery     r/t uterine carcinoma  . Tracheostomy tube placement 08/10/2012    Procedure: TRACHEOSTOMY;  Surgeon: Jerrell Belfast, MD;  Location: [facility];  Service: ENT;  Laterality: N/A;  . Laryngoscopy 08/10/2012    Procedure: LARYNGOSCOPY;  Surgeon: Jerrell Belfast, MD;  Location: [facility];  Service: ENT;  Laterality: N/A;  with biopsy    Assessment / Plan / Recommendation Clinical Impression  Original order was for a swallow assessment.  Chart review revealed PMSV evaluation performed yesterday with plan to allow edema to decrease and reattempt Monday.  Family reports she has produced several slight vocalizations and asked to try valve again today.  SLP in agreement with trial.  Although pt. was unable to phonate with finger occlusion this a.m. valve donned to determine pt.'s tolerance and abiltiy to redirect air with valve.  Pt. able to donn valve for a range of 2-10 seconds throughout session.  Significant Co2 retention forcing valve off with no change in vitals.  It is recommended PMSV trials with SLP only at this time.  Family verbalized understanding and in agreement with plan.    Plan  Continue with current plan of care    Follow Up Recommendations   (to be determined)         SLP Goals Potential to Achieve Goals: Good Potential Considerations: Medical prognosis;Severity of impairments SLP Goal #1: Pt will  demonstrate ability to phonate with tracheal finger occlusion by SLP with mod assist to aid in determining readiness for PMSV trial.  SLP Goal #1 - Progress: Progressing toward goal SLP Goal #2: Pt. will donn PMSV for 60 seconds with stable vital signs and full supervision.   PMSV Trial  PMSV was placed for:  (Donn and doff PMSV over 15 min period) Able to redirect subglottic air through upper airway: No Able to Attain Phonation: No Voice Quality:  (N/A) Able to Expectorate Secretions: Yes Level of Secretion Expectoration with PMSV: Tracheal Breath Support for Phonation: Severely decreased Intelligibility: Unable to assess (comment) Respirations During Trial:  (19) SpO2 During Trial: 96 % Pulse During Trial: 85  Behavior: Alert;Cooperative;Good eye contact;Responsive to questions;Smiling   Tracheostomy Tube  Additional Tracheostomy Tube Assessment Fenestrated: No Level of Secretion Expectoration: Tracheal    Vent Dependency  Vent Dependent: No FiO2 (%): 28 %    Cuff Deflation Trial      Tolerated Cuff Deflation: Yes Length of Time for Cuff Deflation Trial:  (cuff remains deflatted) Behavior: Alert;Cooperative;Good eye contact;Responsive to questions;Smiling   Orbie Pyo [place].[employer] [phone]  08/13/2012

## 2012-08-13 NOTE — Progress Notes (Signed)
[facility] PROGRESS NOTE  [name] [id number] DOB: 11/29/1946 DOA: 08/08/2012 PCP: [name], MD   Brief narrative: 66 y/o spanish speaking female, originally from [place], who was admitted on 08/08/12 with left sided chest pain, dyspnea, NP cough and weight loss.  In the ED a CT angio of the chest was done and showed a soft tissue mass in the larynx just above the true cords compressing the airway. Fractures of the lateral aspect of the left fifth sixth and seventh ribs were noted.  A CT of the soft tissue neck was done and showed a 3.8 x 3.2 x 2.6 cm laryngeal mass above the level of the true cords involving the aryepiglottic folds, epiglottis, piriform sinuses and vallecula. It was noted to be necrotic on the left and causing marked airway narrowing -most compatible with a primary squamous cell cancer. Necrotic metastatic adenopathy on the right compatible with metastatic squamous cell cancer and COPD changes were noted.  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  She has been seen by radiation oncology and oncology with plans to proceed with larynx sparing therapy as an outpatient.   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  CT scans of the chest and neck confirmed a laryngeal mass.  Dr. [name] (ENT) saw patient in consultation on 08/09/12 and proceeded with diagnostic laryngoscopy, biopsy and placement of a tracheostomy tube for airway management on 08/10/12.  Biopsies confirmed SCC.  Continue supplemental oxygen.  Diet advanced, monitor intake, may need PEG for nutrition.  Monitor clinical course given post operative X-ray findings suggestive of atelectasis (though aspiration and PNA not  excluded).  Call placed to Dr. Enrique Sack for consultation regarding dental implants and how these might impact her therapy. Active Problems:  Severe malnutrition secondary to acute illness  PO intake remains poor and is not likely to improve given need for chemo/XRT.  ST evaluation today.  May need PEG, spoke with daughter about this, who wants to see how she does today before agreeing to this.  Normocytic anemia  Likely multifactorial with AOCD and surgical losses contributory.  COPD  Continue supplemental oxygen and nebulized bronchodilator therapy as needed.  Hypertension  Systolic BP elevated. Tribenzor resumed.  Continue lopressor IV PRN.  Multiple closed fractures of ribs of left side  Secondary to recent fall from her bed.  Continue pain control efforts.  Tobacco abuse  Counseled on smoking cessation.  Continue nicotine patch.  Hiatal hernia  Found incidentally on CT of the chest.  On Pepcid.  Acute kidney injury  Likely pre-renal in etiology, creatinine elevation responded to hydration.   Code Status: Full code  Family Communication: Daughter updated. Disposition Plan: ICU monitoring given risk of airway compromise.   Medical Consultants:  Dr. [name], ENT.  Dr. Arloa Koh, Radiation Oncology  Dr. Sherryl Manges, Oncology  Other Consultants:  Dietician: Ensure, magic cup ordered.  Procedures:  Direct laryngoscopy with biopsy and tracheostomy tube placement done by Dr. [name] 08/10/12.  Antibiotics:  None  HPI/Subjective: [name] denies dyspnea.  She has complaints of a burning sensation in her right breast when she gets up to the bathroom.  The patient's daughter says she was able to take in 2 cans of Ensure yesterday.  She is now able to  make mouth noises (couldn't before) such as kissing sounds.  Daughter spoke with me about possibly putting her on an appetite stimulant, but would prefer not to start steroids due to risk of delayed  wound healing or Remeron due to risk of sedation.  We spoke about advancing her diet to pureed so that she had more palatable food options.   Objective: Filed Vitals:   08/13/12 0200 08/13/12 0400 08/13/12 0600 08/13/12 0647  BP: 154/66 167/57 180/64 152/52  Pulse: 73 71 75 74  Temp:  99 F (37.2 C)    TempSrc:      Resp: 13 14 17 14   Height:      Weight:      SpO2: 97% 96% 97% 96%    Intake/Output Summary (Last 24 hours) at 08/13/12 0734 Last data filed at 08/13/12 0700  Gross per 24 hour  Intake   3054 ml  Output   1350 ml  Net   1704 ml    Exam: Gen:  NAD Cardiovascular:  RRR, No M/R/G Respiratory: Lungs with a few scattered rhonchi Gastrointestinal: Abdomen soft, NT/ND with normal active bowel sounds. Extremities: No C/E/C  Data Reviewed: Basic Metabolic Panel:  Lab [id number] 08/12/12 0313 08/11/12 0313 08/10/12 0303 08/09/12 0327 08/08/12 1840  NA 141 137 138 140 -- 139  K 3.7 3.5 -- -- -- --  CL 106 104 105 106 -- 101  CO2 29 26 25 27  -- 29  GLUCOSE 114* 129* 127* 120* -- 102*  BUN 5* 6 9 13  -- 31*  CREATININE 0.85 0.86 0.85 0.89 1.14* --  CALCIUM 8.8 8.7 8.8 8.9 -- 10.2  MG -- -- -- -- -- --  PHOS -- -- -- -- -- --   GFR Estimated Creatinine Clearance: 46.8 ml/min (by C-G formula based on Cr of 0.85). Liver Function Tests:  Lab 08/10/12 0303  AST 16  ALT 9  ALKPHOS 82  BILITOT 0.3  PROT 6.1  ALBUMIN 3.2*   CBC:  Lab 08/13/12 0350 08/12/12 0313 08/11/12 0313 08/10/12 0303 08/09/12 0327 08/08/12 1840  WBC 8.4 9.9 10.0 8.4 9.1 --  NEUTROABS -- -- -- -- -- 7.0  HGB 9.6* 9.9* 10.8* 11.4* 11.3* --  HCT 28.6* 30.0* 32.1* 34.2* 34.6* --  MCV 83.9 83.8 83.6 84.4 84.2 --  PLT 240 240 260 269 254 --   Microbiology Recent Results (from the past 240 hour(s))  MRSA PCR SCREENING     Status: Normal   Collection Time   08/09/12  2:36 AM      Component Value Range Status Comment   MRSA by PCR NEGATIVE  NEGATIVE Final      Studies:  Ct Soft  Tissue Neck W Contrast 08/08/2012   IMPRESSION:  1.  3.8 x 3.2 x 2.6 cm laryngeal mass above the level of the true cords, involving the aryepiglottic folds, epiglottis, piriform sinuses and vallecula.  This is necrotic on the left and is causing marked airway narrowing.  This is most compatible with a primary squamous cell carcinoma. 2.  Necrotic metastatic adenopathy on the right, compatible with metastatic squamous cell carcinoma. 3.  COPD.   Original Report Authenticated By: Gerald Stabs, M.D.     Ct Angio Chest Pe W/cm &/or Wo Cm 08/08/2012  IMPRESSION:  1.  There appears to be a soft tissue mass in the larynx just above the true cords compressing the airway. 2.  Fractures of the lateral aspects of the left fifth, sixth, and seventh  ribs. There may be a fracture of the anterior lateral aspect of the left eighth rib as well. 3.  No pulmonary emboli.   Original Report Authenticated By: Larey Seat, M.D.     Dg Chest Port 1 View 08/10/2012  IMPRESSION:  1.  Interval placement of a tracheostomy tube without radiographic evidence for complication. 2.  New left basilar airspace disease.  This likely reflects atelectasis.  Early infection or aspiration is not excluded.   Original Report Authenticated By: Resa Miner. MATTERN, M.D.    Scheduled Meds:    . amitriptyline  10 mg Oral QHS  . antiseptic oral rinse  15 mL Mouth Rinse q12n4p  . chlorhexidine  15 mL Mouth Rinse BID  . enoxaparin (LOVENOX) injection  40 mg Subcutaneous Q24H  . famotidine (PEPCID) IV  20 mg Intravenous Q24H  . feeding supplement  237 mL Oral BID BM  . hydrochlorothiazide  12.5 mg Oral Daily  . irbesartan  150 mg Oral Daily  . nicotine  21 mg Transdermal Daily   Continuous Infusions:    . dextrose 5 % and 0.9% NaCl 100 mL/hr at 08/12/12 2120    Time spent: 35 minutes.   LOS: 5 days   [facility] Pager [phone].  If 8PM-8AM, please contact night-coverage at [email or web address], password  [facility] 08/13/2012, 7:34 AM

## 2012-08-13 NOTE — Evaluation (Signed)
Clinical/Bedside Swallow Evaluation Patient Details  Name: Ezekiel Couey MRN: [id number] Date of Birth: 1946/05/03  Today's Date: 08/13/2012 Time: [id number] SLP Time Calculation (min): 14 min  Past Medical History:  Past Medical History  Diagnosis Date  . Hypertension   . Hypercholesteremia   . Anxiety   . Stroke   . Uterine cancer   . COPD (chronic obstructive pulmonary disease) 08/10/2012  . Hiatal hernia 08/10/2012  . 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 Abdominal surgery     r/t uterine carcinoma  . Tracheostomy tube placement 08/10/2012    Procedure: TRACHEOSTOMY;  Surgeon: Jerrell Belfast, MD;  Location: [facility];  Service: ENT;  Laterality: N/A;  . Laryngoscopy 08/10/2012    Procedure: LARYNGOSCOPY;  Surgeon: Jerrell Belfast, MD;  Location: [facility];  Service: ENT;  Laterality: N/A;  with biopsy   HPI:  66 y/o spanish speaking female, originally from [place], who was admitted on 08/08/12 with left sided chest pain, dyspnea, NP cough and weight loss.  In the ED a CT angio of the chest was done and showed a soft tissue mass in the larynx just above the true cords compressing the airway. Fractures of the lateral aspect of the left fifth sixth and seventh ribs were noted.  A CT of the soft tissue neck was done and showed a 3.8 x 3.2 x 2.6 cm laryngeal mass above the level of the true cords involving the aryepiglottic folds, epiglottis, piriform sinuses and vallecula. It was noted to be necrotic on the left and causing marked airway narrowing -most compatible with a primary squamous cell cancer. Necrotic metastatic adenopathy on the right compatible with metastatic squamous cell cancer and COPD changes were noted.  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  ENT wrote for cuff deflation and  PMSV as tolerated.     Assessment / Plan / Recommendation Clinical Impression  Asessment of swallow completed in pt. s/p trach due to laryngeal mass.  Diet of full liquids was ordered for pt. yesterday in addition to PMSV evaluation resulting in recommendation of not using valve due inability to produce phonation.  PMSV reattempted prior to BSE resulting in pt. donning valve for only 2-10 seconds with significant evidence or C02 retention.  Observed pt. with puree and thin liquids (without valve) with timely swallow initiation with adequate laryngeal elevation.  Unable to assess vocal quality due to aphonia.  Pt. alert and cognitively intact and reported food/liquid transited without sensation of pharyngeal residue.  Pt. is at higher aspiration risk due to laryngeal tumor and will need to be observed closely for signs of decreased po tolerance, change in lung sounds.  CXR 8/28 with possible atelectasis and inability to exclude aspiration.  Agree with diet upgrade this a.m. to puree.        Aspiration Risk  Moderate    Diet Recommendation Dysphagia 1 (Puree);Thin liquid   Liquid Administration via: Cup;No straw Medication Administration: Whole meds with puree Compensations: Slow rate;Small sips/bites Postural Changes and/or Swallow Maneuvers: Seated upright 90 degrees;Upright 30-60 min after meal    Other  Recommendations Oral Care Recommendations: Oral care BID   Follow Up Recommendations   (to be determined)    Frequency and Duration min 2x/week  2 weeks        SLP Swallow Goals Patient will consume recommended diet  without observed clinical signs of aspiration with: Independent assistance Patient will utilize recommended strategies during swallow to increase swallowing safety with: Independent assistance   Swallow Study          Oral/Motor/Sensory Function Overall Oral Motor/Sensory Function: Appears within functional limits for tasks assessed   Ice Chips Ice chips: Not tested     Thin Liquid Thin Liquid: Within functional limits Presentation: Cup    Nectar Thick Nectar Thick Liquid: Not tested   Honey Thick Honey Thick Liquid: Not tested   Puree Puree: Within functional limits   Solid       Solid: Not tested       Houston Siren M.Ed [employer] [phone]  08/13/2012

## 2012-08-13 NOTE — Progress Notes (Signed)
PT Cancellation Note  Treatment cancelled today due to pt currently sleeping.  Family member defers PT for pt at this time.  They state she has been up in room this morning already and is now resting.  Maud[facility] 08/13/2012, 11:26 AM

## 2012-08-14 MED ORDER — HYDROMORPHONE HCL PF 1 MG/ML IJ SOLN
0.5000 mg | INTRAMUSCULAR | Status: DC | PRN
  Administered 2012-08-14: 0.5 mg via INTRAVENOUS

## 2012-08-14 MED ORDER — HYDROMORPHONE HCL PF 1 MG/ML IJ SOLN
0.5000 mg | INTRAMUSCULAR | Status: DC | PRN
  Administered 2012-08-14 – 2012-08-18 (×20): 0.5 mg via INTRAVENOUS
  Filled 2012-08-14 (×20): qty 1

## 2012-08-14 MED ORDER — HYDROMORPHONE HCL PF 1 MG/ML IJ SOLN
INTRAMUSCULAR | Status: AC
  Filled 2012-08-14: qty 1

## 2012-08-14 NOTE — Evaluation (Signed)
ONE TIME Physical Therapy Evaluation Patient Details Name: [name] MRN: [id number] DOB: Apr 03, 1946 Today's Date: 08/14/2012 Time:  -     PT Assessment / Plan / Recommendation Clinical Impression  In the ED a CT angio of the chest was done and showed a soft tissue mass in the larynx just above the true cords compressing the airway. Fractures of the lateral aspect of the left fifth sixth and seventh ribs were noted.  A CT of the soft tissue neck was done and showed a 3.8 x 3.2 x 2.6 cm laryngeal mass ; pt presents at grossly supervision level for  functionall mobility; family has been assisting  her to BR.They feel comfortable continuing to mobilize her and pt agrees;  They will need assist form RT or nursing to transfer [place] to portable O2., defer teaching to these disciplines    PT Assessment  Patent does not need any further PT services    Follow Up Recommendations  No PT follow up    Barriers to Discharge        Equipment Recommendations  None recommended by PT (at this time)    Recommendations for Other Services     Frequency      Precautions / Restrictions Precautions Precautions: None Precaution Comments: T*collar   Pertinent Vitals/Pain VSS through out; Sats 97-100%      Mobility  Bed Mobility Bed Mobility: Supine to Sit Supine to Sit: 5: Supervision;4: Min guard Details for Bed Mobility Assistance: due to multiple lines Transfers Transfers: Sit to Stand;Stand to Sit Sit to Stand: 5: Supervision Stand to Sit: 5: Supervision Details for Transfer Assistance: due to multiple lines Ambulation/Gait Ambulation/Gait Assistance: 5: Supervision;4: Min guard Ambulation Distance (Feet): 250 Feet Assistive device: None Ambulation/Gait Assistance Details: son assists with IV pole; pt requiring asssist due to multiple lines, for safety Gait Pattern: Within Functional Limits Gait velocity: normal    Exercises     PT Diagnosis:    PT Problem List:   PT Treatment  Interventions:     PT Goals    Visit Information  Last PT Received On: 08/14/12 Assistance Needed:  (+1.5; family able to assist)    Subjective Data  Subjective: family translates and pt nods yes to PT   Prior [place] Lives With: Family;Son Available Help at Discharge: Family;Available 24 hours/day Type of Home: House Home Access: Stairs to enter [employer] of Steps: 3 Entrance Stairs-Rails: Right Home Layout: One level Home Adaptive Equipment: None Additional Comments: pt  family supportive and  able to provide necessary assist Prior Function Level of Independence: Independent Able to Take Stairs?: Yes Communication Communication: Prefers language other than English    Cognition  Overall Cognitive Status: Appears within functional limits for tasks assessed/performed Arousal/Alertness: Awake/alert Orientation Level: Appears intact for tasks assessed Behavior During Session: Anxious (briefly due to feeling like she couldn't get air)    Extremity/Trunk Assessment Right Upper Extremity Assessment RUE ROM/Strength/Tone: [facility] for tasks assessed Left Upper Extremity Assessment LUE ROM/Strength/Tone: Deficits;Due to pain LUE ROM/Strength/Tone Deficits: left rib pain, shoulder elevation painful Right Lower Extremity Assessment RLE ROM/Strength/Tone: [facility] for tasks assessed Left Lower Extremity Assessment LLE ROM/Strength/Tone: [facility] for tasks assessed   Balance    End of Session PT - End of Session Activity Tolerance: Patient tolerated treatment well Patient left: in chair;with call bell/phone within reach;with family/visitor present Nurse Communication: Mobility status  GP     [facility] 08/14/2012, 9:51 AM

## 2012-08-14 NOTE — Progress Notes (Signed)
[facility] PROGRESS NOTE  [name] [id number] DOB: [birth year] DOA: 08/08/2012 PCP: [name], MD   Brief narrative: 66 y/o spanish speaking female, originally from [place], who was admitted on 08/08/12 with left sided chest pain, dyspnea, NP cough and weight loss.  In the ED a CT angio of the chest was done and showed a soft tissue mass in the larynx just above the true cords compressing the airway. Fractures of the lateral aspect of the left fifth sixth and seventh ribs were noted.  A CT of the soft tissue neck was done and showed a 3.8 x 3.2 x 2.6 cm laryngeal mass above the level of the true cords involving the aryepiglottic folds, epiglottis, piriform sinuses and vallecula. It was noted to be necrotic on the left and causing marked airway narrowing -most compatible with a primary squamous cell cancer. Necrotic metastatic adenopathy on the right compatible with metastatic squamous cell cancer and COPD changes were noted.  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  She has been seen by radiation oncology and oncology with plans to proceed with larynx sparing therapy as an outpatient.   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  CT scans of the chest and neck confirmed a laryngeal mass.  Dr. [name] (ENT) saw patient in consultation on 08/09/12 and proceeded with diagnostic laryngoscopy, biopsy and placement of a tracheostomy tube for airway management on 08/10/12.  Biopsies confirmed SCC.  Continue supplemental oxygen.  Diet advanced, monitor intake, may need PEG for nutrition.  Monitor clinical course given post operative X-ray findings suggestive of atelectasis (though aspiration and PNA not  excluded).  Call placed to Dr. Enrique Sack for consultation regarding dental implants and how these might impact her therapy. Active Problems:  Severe malnutrition secondary to acute illness  PO intake improving with pureed diet.  ST evaluation assessed swallowing function 08/13/12.  May need PEG, spoke with daughter about this, who wants to see how she does today before agreeing to this.  Normocytic anemia  Likely multifactorial with AOCD and surgical losses contributory.  COPD  Continue supplemental oxygen and nebulized bronchodilator therapy as needed.  Hypertension  Systolic BP elevated. Tribenzor resumed.  Continue lopressor IV PRN.  Multiple closed fractures of ribs of left side  Secondary to recent fall from her bed.  Continue pain control efforts.  Tobacco abuse  Counseled on smoking cessation.  Continue nicotine patch.  Hiatal hernia  Found incidentally on CT of the chest.  On Pepcid.  Acute kidney injury  Likely pre-renal in etiology, creatinine elevation responded to hydration.   Code Status: Full code  Family Communication: Daughter updated. Disposition Plan: Transfer to floor, home soon.   Medical Consultants:  Dr. [name], ENT.  Dr. Arloa Koh, Radiation Oncology  Dr. Sherryl Manges, Oncology  Other Consultants:  Dietician: Ensure, magic cup ordered.  Procedures:  Direct laryngoscopy with biopsy and tracheostomy tube placement done by Dr. [name] 08/10/12.  Antibiotics:  None  HPI/Subjective: Mrs. Selesky had a BM yesterday.  She sat up for 1/2 hour and tolerated this well.  She is having less burning pain in the right breast.  Did not want to transfer out of ICU when plan of car discussed with her and her daughter, anxious.  No dyspnea.  Objective: Filed Vitals:   08/14/12 0000 08/14/12 0020 08/14/12 0400 08/14/12 0600  BP: 173/67  166/78 166/57  Pulse: 67 70 71 67  Temp: 98.8 F (37.1 C)  98.8 F (37.1 C)   TempSrc: Oral   Oral   Resp: 14 19 21 18   Height:      Weight:   54.2 kg (119 lb 7.8 oz)   SpO2: 97% 97% 97% 98%    Intake/Output Summary (Last 24 hours) at 08/14/12 0756 Last data filed at 08/14/12 0600  Gross per 24 hour  Intake   2815 ml  Output   1550 ml  Net   1265 ml    Exam: Gen:  NAD Cardiovascular:  RRR, No M/R/G Respiratory: Lungs CTAB Gastrointestinal: Abdomen soft, NT/ND with normal active bowel sounds. Extremities: No C/E/C  Data Reviewed: Basic Metabolic Panel:  Lab [id number] 08/12/12 0313 08/11/12 0313 08/10/12 0303 08/09/12 0327 08/08/12 1840  NA 141 137 138 140 -- 139  K 3.7 3.5 -- -- -- --  CL 106 104 105 106 -- 101  CO2 29 26 25 27  -- 29  GLUCOSE 114* 129* 127* 120* -- 102*  BUN 5* 6 9 13  -- 31*  CREATININE 0.85 0.86 0.85 0.89 1.14* --  CALCIUM 8.8 8.7 8.8 8.9 -- 10.2  MG -- -- -- -- -- --  PHOS -- -- -- -- -- --   GFR Estimated Creatinine Clearance: 46.8 ml/min (by C-G formula based on Cr of 0.85). Liver Function Tests:  Lab 08/10/12 0303  AST 16  ALT 9  ALKPHOS 82  BILITOT 0.3  PROT 6.1  ALBUMIN 3.2*   CBC:  Lab 08/13/12 0350 08/12/12 0313 08/11/12 0313 08/10/12 0303 08/09/12 0327 08/08/12 1840  WBC 8.4 9.9 10.0 8.4 9.1 --  NEUTROABS -- -- -- -- -- 7.0  HGB 9.6* 9.9* 10.8* 11.4* 11.3* --  HCT 28.6* 30.0* 32.1* 34.2* 34.6* --  MCV 83.9 83.8 83.6 84.4 84.2 --  PLT 240 240 260 269 254 --   Microbiology Recent Results (from the past 240 hour(s))  MRSA PCR SCREENING     Status: Normal   Collection Time   08/09/12  2:36 AM      Component Value Range Status Comment   MRSA by PCR NEGATIVE  NEGATIVE Final      Studies:  Ct Soft Tissue Neck W Contrast 08/08/2012   IMPRESSION:  1.  3.8 x 3.2 x 2.6 cm laryngeal mass above the level of the true cords, involving the aryepiglottic folds, epiglottis, piriform sinuses and vallecula.  This is necrotic on the left and is causing marked airway narrowing.  This is most compatible with a primary squamous cell  carcinoma. 2.  Necrotic metastatic adenopathy on the right, compatible with metastatic squamous cell carcinoma. 3.  COPD.   Original Report Authenticated By: Gerald Stabs, M.D.     Ct Angio Chest Pe W/cm &/or Wo Cm 08/08/2012  IMPRESSION:  1.  There appears to be a soft tissue mass in the larynx just above the true cords compressing the airway. 2.  Fractures of the lateral aspects of the left fifth, sixth, and seventh ribs. There may be a fracture of the anterior lateral aspect of the left eighth rib as well. 3.  No pulmonary emboli.   Original Report Authenticated By: Larey Seat, M.D.     Dg Chest Port 1 View 08/10/2012  IMPRESSION:  1.  Interval placement of a tracheostomy tube without radiographic evidence for  complication. 2.  New left basilar airspace disease.  This likely reflects atelectasis.  Early infection or aspiration is not excluded.   Original Report Authenticated By: Resa Miner. MATTERN, M.D.    Scheduled Meds:    . amitriptyline  10 mg Oral QHS  . antiseptic oral rinse  15 mL Mouth Rinse q12n4p  . chlorhexidine  15 mL Mouth Rinse BID  . enoxaparin (LOVENOX) injection  40 mg Subcutaneous Q24H  . famotidine (PEPCID) IV  20 mg Intravenous Q24H  . feeding supplement  237 mL Oral BID BM  . nicotine  21 mg Transdermal Daily  . Olmesartan-Amlodipine-HCTZ  0.5 tablet Oral Daily  . DISCONTD: hydrochlorothiazide  12.5 mg Oral Daily  . DISCONTD: irbesartan  150 mg Oral Daily  . DISCONTD: Olmesartan-Amlodipine-HCTZ  1 tablet Oral Daily   Continuous Infusions:    . dextrose 5 % and 0.9% NaCl 100 mL/hr at 08/14/12 0309    Time spent: 30 minutes.   LOS: 6 days   [facility] Pager [phone].  If 8PM-8AM, please contact night-coverage at [email or web address], password [facility] 08/14/2012, 7:56 AM

## 2012-08-14 NOTE — Progress Notes (Signed)
Patient transferred to 1304 via wheel chair, accompanied by family, with her personal belongings. Receiving nurse called and given report. Patient pleasant and stable

## 2012-08-15 LAB — CREATININE, SERUM
Creatinine, Ser: 0.87 mg/dL (ref 0.50–1.10)
GFR calc Af Amer: 79 mL/min — ABNORMAL LOW (ref >90)

## 2012-08-15 MED ORDER — FAMOTIDINE 40 MG/5ML PO SUSR: 20.0000 mg | Freq: Every day | ORAL | Status: DC

## 2012-08-15 MED ORDER — RANITIDINE HCL 150 MG/10ML PO SYRP
150.0000 mg | ORAL_SOLUTION | Freq: Every day | ORAL | Status: DC
  Administered 2012-08-15 – 2012-08-22 (×7): 150 mg via ORAL
  Filled 2012-08-15 (×8): qty 10

## 2012-08-15 MED ORDER — PROMETHAZINE HCL 25 MG/ML IJ SOLN
12.5000 mg | Freq: Once | INTRAMUSCULAR | Status: AC
  Administered 2012-08-15: 12.5 mg via INTRAVENOUS
  Filled 2012-08-15: qty 1

## 2012-08-15 NOTE — Progress Notes (Signed)
[facility] PROGRESS NOTE  [name] [id number] DOB: 02-May-1946 DOA: 08/08/2012 PCP: [name], MD   Brief narrative: 66 y/o spanish speaking female, originally from [place], who was admitted on 08/08/12 with left sided chest pain, dyspnea, NP cough and weight loss.  In the ED a CT angio of the chest was done and showed a soft tissue mass in the larynx just above the true cords compressing the airway. Fractures of the lateral aspect of the left fifth sixth and seventh ribs were noted.  A CT of the soft tissue neck was done and showed a 3.8 x 3.2 x 2.6 cm laryngeal mass above the level of the true cords involving the aryepiglottic folds, epiglottis, piriform sinuses and vallecula. It was noted to be necrotic on the left and causing marked airway narrowing -most compatible with a primary squamous cell cancer. Necrotic metastatic adenopathy on the right compatible with metastatic squamous cell cancer and COPD changes were noted.  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  She has been seen by radiation oncology and oncology with plans to proceed with larynx sparing therapy as an outpatient.   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  CT scans of the chest and neck confirmed a laryngeal mass.  Dr. [name] (ENT) saw patient in consultation on 08/09/12 and proceeded with diagnostic laryngoscopy, biopsy and placement of a tracheostomy tube for airway management on 08/10/12.  Biopsies confirmed SCC.  Continue supplemental oxygen via trach collar.  Diet advanced to pureed, tolerating well.    Monitor clinical course given post operative X-ray findings suggestive of atelectasis (though aspiration and PNA not  excluded).  Call placed to Dr. Enrique Sack for consultation regarding dental implants and how these might impact her therapy.  So far, not available, likely due to holiday weekend. Active Problems:  Severe malnutrition secondary to acute illness  PO intake improving with pureed diet.  ST evaluation assessed swallowing function 08/13/12.  May need PEG, spoke with daughter about this, who wants to see how she does today before agreeing to this.  Normocytic anemia  Likely multifactorial with AOCD and surgical losses contributory.  COPD  Continue supplemental oxygen and nebulized bronchodilator therapy as needed.  Hypertension  Systolic BP elevated. Tribenzor resumed.  Continue lopressor IV PRN.  Multiple closed fractures of ribs of left side  Secondary to recent fall from her bed.  Continue pain control efforts.  Tobacco abuse  Counseled on smoking cessation.  Continue nicotine patch.  Hiatal hernia  Found incidentally on CT of the chest.  On Pepcid.  Acute kidney injury  Likely pre-renal in etiology, creatinine elevation responded to hydration.   Code Status: Full code  Family Communication: Daughter updated. Disposition Plan: Transfer to floor, home soon.   Medical Consultants:  Dr. [name], ENT.  Dr. Arloa Koh, Radiation Oncology  Dr. Sherryl Manges, Oncology  Other Consultants:  Dietician: Ensure, magic cup ordered.  Procedures:  Direct laryngoscopy with biopsy and tracheostomy tube placement done by Dr. [name] 08/10/12.  Antibiotics:  None  HPI/Subjective: [name] had some nausea yesterday, but no vomiting.  She continues to need pain medication for throat discomfort.  Bowels moved yesterday.  No dyspnea.  No current pain.   Objective: Filed Vitals:   08/14/12 2050 08/14/12  2341 08/15/12 0307 08/15/12 0510  BP: 154/60   188/65  Pulse: 86 82 80 65  Temp: 99.3 F (37.4 C)   98.2 F (36.8 C)  TempSrc: Oral   Oral  Resp: 16 17 22 18    Height:      Weight:      SpO2: 94% 94% 92% 95%    Intake/Output Summary (Last 24 hours) at 08/15/12 0752 Last data filed at 08/15/12 0500  Gross per 24 hour  Intake 1071.67 ml  Output    800 ml  Net 271.67 ml    Exam: Gen:  NAD Cardiovascular:  RRR, No M/R/G Respiratory: Lungs course but clear. Gastrointestinal: Abdomen soft, NT/ND with normal active bowel sounds. Extremities: No C/E/C  Data Reviewed: Basic Metabolic Panel:  Lab [id number] 08/13/12 0350 08/12/12 0313 08/11/12 0313 08/10/12 0303 08/08/12 1840  NA -- 141 137 138 140 139  K -- 3.7 3.5 -- -- --  CL -- 106 104 105 106 101  CO2 -- 29 26 25 27 29   GLUCOSE -- 114* 129* 127* 120* 102*  BUN -- 5* 6 9 13  31*  CREATININE 0.87 0.85 0.86 0.85 0.89 --  CALCIUM -- 8.8 8.7 8.8 8.9 10.2  MG -- -- -- -- -- --  PHOS -- -- -- -- -- --   GFR Estimated Creatinine Clearance: 45.7 ml/min (by C-G formula based on Cr of 0.87). Liver Function Tests:  Lab 08/10/12 0303  AST 16  ALT 9  ALKPHOS 82  BILITOT 0.3  PROT 6.1  ALBUMIN 3.2*   CBC:  Lab 08/13/12 0350 08/12/12 0313 08/11/12 0313 08/10/12 0303 08/09/12 0327 08/08/12 1840  WBC 8.4 9.9 10.0 8.4 9.1 --  NEUTROABS -- -- -- -- -- 7.0  HGB 9.6* 9.9* 10.8* 11.4* 11.3* --  HCT 28.6* 30.0* 32.1* 34.2* 34.6* --  MCV 83.9 83.8 83.6 84.4 84.2 --  PLT 240 240 260 269 254 --   Microbiology Recent Results (from the past 240 hour(s))  MRSA PCR SCREENING     Status: Normal   Collection Time   08/09/12  2:36 AM      Component Value Range Status Comment   MRSA by PCR NEGATIVE  NEGATIVE Final      Studies:  Ct Soft Tissue Neck W Contrast 08/08/2012   IMPRESSION:  1.  3.8 x 3.2 x 2.6 cm laryngeal mass above the level of the true cords, involving the aryepiglottic folds, epiglottis, piriform sinuses and vallecula.  This is necrotic on the left and is causing marked airway narrowing.  This is most compatible with a primary squamous cell carcinoma. 2.  Necrotic metastatic  adenopathy on the right, compatible with metastatic squamous cell carcinoma. 3.  COPD.   Original Report Authenticated By: Gerald Stabs, M.D.     Ct Angio Chest Pe W/cm &/or Wo Cm 08/08/2012  IMPRESSION:  1.  There appears to be a soft tissue mass in the larynx just above the true cords compressing the airway. 2.  Fractures of the lateral aspects of the left fifth, sixth, and seventh ribs. There may be a fracture of the anterior lateral aspect of the left eighth rib as well. 3.  No pulmonary emboli.   Original Report Authenticated By: Larey Seat, M.D.     Dg Chest Port 1 View 08/10/2012  IMPRESSION:  1.  Interval placement of a tracheostomy tube without radiographic evidence for complication. 2.  New left basilar airspace disease.  This likely reflects atelectasis.  Early infection or aspiration is not excluded.   Original Report Authenticated By: Resa Miner. MATTERN, M.D.    Scheduled Meds:    . amitriptyline  10 mg Oral QHS  . antiseptic oral rinse  15 mL Mouth Rinse q12n4p  . chlorhexidine  15 mL Mouth Rinse BID  . enoxaparin (LOVENOX) injection  40 mg Subcutaneous Q24H  . famotidine  20 mg Oral Daily  . feeding supplement  237 mL Oral BID BM  . HYDROmorphone      . nicotine  21 mg Transdermal Daily  . Olmesartan-Amlodipine-HCTZ  0.5 tablet Oral Daily  . DISCONTD: famotidine (PEPCID) IV  20 mg Intravenous Q24H   Continuous Infusions:    . dextrose 5 % and 0.9% NaCl 50 mL/hr (08/14/12 1711)    Time spent: 25 minutes.   LOS: 7 days   [facility] Pager [phone].  If 8PM-8AM, please contact night-coverage at [email or web address], password [facility] 08/15/2012, 7:52 AM

## 2012-08-15 NOTE — Progress Notes (Signed)
Pt refuses suctioning. Family member says she suctioned about 10 minutes ago.

## 2012-08-15 NOTE — Progress Notes (Signed)
[facility] Speaking Valve - Treatment Patient Details  Name: [name] MRN: [id number] Date of Birth: 1946-06-13  Today's Date: 08/15/2012 Time: [id number] SLP Time Calculation (min): 30 min  Past Medical History:  Past Medical History  Diagnosis Date  . Hypertension   . Hypercholesteremia   . Anxiety   . Stroke   . Uterine cancer   . COPD (chronic obstructive pulmonary disease) 08/10/2012  . Hiatal hernia 08/10/2012  . 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 Abdominal surgery     r/t uterine carcinoma  . Tracheostomy tube placement 08/10/2012    Procedure: TRACHEOSTOMY;  Surgeon: Jerrell Belfast, MD;  Location: [facility]S;  Service: ENT;  Laterality: N/A;  . Laryngoscopy 08/10/2012    Procedure: LARYNGOSCOPY;  Surgeon: Jerrell Belfast, MD;  Location: [facility]S;  Service: ENT;  Laterality: N/A;  with biopsy    Assessment / Plan / Recommendation Clinical Impression PMSV:             PO Tolerance: Pt seen for PMSV readiness, pt able to phonate with finger occulsion of trach but with very hoarse/harsh vocal quality.  Per family, pt with copious secretions but declining to be tracheally suctioned by RT.  PMSV placed on pt with harsh phonation and pt overtly coughing within 5 seconds - SLP removed valve and pt expectorated viscous secretions via trach.  Did not reattempt valve at that time due to viscous secretions and concern for mucus plugging.  Concern for pt ability to use valve is present given her laryngeal mass likely impairing upper airway clearance.  Next trial, SLP will ask RT or nursing to suction pt to assure optimal trial as secretion retention may impair her phonatory ability as well.  Family and pt educated to concerns and goals of decr edema, decr secretions and improved phonation.  Pt may require shrinkage of tumor via tx before she can use valve if secretion management and decr  edema does not result in significant improvement.  Pt is cognitively intact and is communicating well with her family.     Observed pt to swallow liquid fruit flavored Boost with delayed cough - Son reports pt coughing more with intake than without.  Pt states her swallow ability is better now than prior to admission.   Note plan for possible PEG - due to poor intake- SLP gave pt Boost Breeze-Peach flavored as family reports Ensure is causes pt to feel bloated.  Unfortunately SLP can not assure pt is not overtly aspirating at bedside (even some of her secretions) given her tumor and lack of ability to phonate resulting in decr airway protection and cough.  She would benefit from instrumental evaluation in this SLPs opinion to assure least restrictive diet and appropriate compensatory strategies are in place.   Also per CXR 8-28 indicating new left basilar airspace disease, ? atx but aspiration or infection not excluded.   Educated family and pt to aspiration precaution importance.    Family with many questions that SLP answered to their satisfaction per family.    SLP to follow up, MD please order MBS if you agree- (family not educated to this recommendation-defer to MD decision).  Thanks.        Plan  Continue with current plan of care    Follow Up Recommendations  Other (comment) (to be determined)    Pertinent Vitals/Pain Afebrile, 5 liters oxygen, viscous secretions    SLP Goals  SLP Goal #1 - Progress: Progressing toward goal SLP Goal #2 - Progress: Not met   PMSV Trial  Able to Attain Phonation: Yes (very hoarse, strained vocal quality to voice) Level of Secretion Expectoration with PMSV: Tracheal (incr amount and colored per family) Breath Support for Phonation: Inadequate Respirations During Trial: 19  SpO2 During Trial: 98 % Pulse During Trial: 90  Behavior: Alert;Cooperative;Good eye contact;Responsive to questions;Smiling   Tracheostomy Tube   #6 Shiley    Vent  Dependency  FiO2 (%): 28 %    Cuff Deflation Trial  GO       deflated  Macario Golds 08/15/2012, 5:18 PM

## 2012-08-15 NOTE — Progress Notes (Signed)
OT Note:  Spoke to pt's daughter.  She has been helping pt to the bathroom and doesn't feel she needs any DME for toilet.  She will also help with ADLs as needed.  Pt has a tub at home with hand held shower.  Educated that pt may benefit from seat for endurance as well as balance.  They feel that pt will be able to step over tub with assistance and they will get chair as needed.  Screened only. [place], OTR/L [id number] 08/15/2012

## 2012-08-16 ENCOUNTER — Inpatient Hospital Stay (HOSPITAL_COMMUNITY): Payer: Medicaid Other

## 2012-08-16 ENCOUNTER — Encounter (HOSPITAL_COMMUNITY): Payer: Self-pay | Admitting: Dentistry

## 2012-08-16 DIAGNOSIS — K08109 Complete loss of teeth, unspecified cause, unspecified class: Secondary | ICD-10-CM

## 2012-08-16 DIAGNOSIS — K0820 Unspecified atrophy of edentulous alveolar ridge: Secondary | ICD-10-CM

## 2012-08-16 MED ORDER — ENSURE PUDDING PO PUDG
1.0000 | Freq: Three times a day (TID) | ORAL | Status: DC
  Administered 2012-08-16: 1 via ORAL
  Filled 2012-08-16 (×11): qty 1

## 2012-08-16 MED ORDER — ENSURE COMPLETE PO LIQD: 237.0000 mL | ORAL | Status: DC | PRN

## 2012-08-16 MED ORDER — HYDRALAZINE HCL 20 MG/ML IJ SOLN
INTRAMUSCULAR | Status: AC
  Administered 2012-08-16: 5 mg
  Filled 2012-08-16: qty 1

## 2012-08-16 MED ORDER — KETOROLAC TROMETHAMINE 30 MG/ML IJ SOLN
15.0000 mg | Freq: Four times a day (QID) | INTRAMUSCULAR | Status: AC | PRN
  Administered 2012-08-16 – 2012-08-18 (×5): 15 mg via INTRAVENOUS
  Filled 2012-08-16 (×5): qty 1

## 2012-08-16 MED ORDER — HYDRALAZINE HCL 20 MG/ML IJ SOLN
5.0000 mg | Freq: Once | INTRAMUSCULAR | Status: AC
  Administered 2012-08-17: 5 mg via INTRAVENOUS
  Filled 2012-08-16: qty 1

## 2012-08-16 MED ORDER — PROMETHAZINE HCL 25 MG/ML IJ SOLN
12.5000 mg | Freq: Four times a day (QID) | INTRAMUSCULAR | Status: DC | PRN
  Administered 2012-08-16 – 2012-08-21 (×2): 12.5 mg via INTRAVENOUS
  Filled 2012-08-16 (×2): qty 1

## 2012-08-16 MED ORDER — HYDRALAZINE HCL 20 MG/ML IJ SOLN
5.0000 mg | Freq: Once | INTRAMUSCULAR | Status: AC
  Administered 2012-08-16: 5 mg via INTRAVENOUS

## 2012-08-16 MED ORDER — CEFAZOLIN SODIUM 1-5 GM-% IV SOLN
1.0000 g | INTRAVENOUS | Status: AC
  Administered 2012-08-17: 1 g via INTRAVENOUS
  Filled 2012-08-16: qty 50

## 2012-08-16 MED ORDER — MIRTAZAPINE 15 MG PO TBDP
15.0000 mg | ORAL_TABLET | Freq: Every day | ORAL | Status: DC
  Administered 2012-08-16 – 2012-08-21 (×5): 15 mg via ORAL
  Filled 2012-08-16 (×7): qty 1

## 2012-08-16 NOTE — Consult Note (Signed)
I came by this morning for a social visit to see how patient has been progressing prior to discharge home to be prepared to start outpatient work up and treatment.  Patient appeared depressed, not eating much.  Patient's son Frankey Poot and daughter-in-law were present in the room.  She did not look at me when I communicated in [place].  She did not acknowledged my conversation and input.   Pt's daughter-in-law asked if there could be support personnel that her mother could talk to.  Immediately, pt's son Frankey Poot told his wife to be quiet and let him control the conversation.   I then asked Frankey Poot to sit down with me by the bench so that we could talk about the big picture because it was going to be a long conversation.  He said that he preferred to stand.  He said that he is not clear about the plan and asked that ENT, Rad Onc, and Med Onc sit down in the room at the same time to give him a plan.    I again reiterated to him that the plan of treatment as I explained last week to him and patient:   - train in trach tube care - D/C home when ready as per determination by Dr. [name] for out patient PET scan to rule out distant met since I have concerned about the rib fractures.  Last week, patient denied to me any fall or trauma.  Frankey Poot now said that patient must have broken her ribs while "pulling weeds from her garden."   - If there is no distant met, then most likely plan will be daily radiation and weekly Cisplatin.  - I brought up the possibility since Dr. Valere Dross put in his note last week about induction chemo with triple chemo Cisplatin/5Fu/Taxotere.  I told Frankey Poot that induction chemo has not been proven to improve survival but potentially improve distant control.  However, I personally believe that patient is too frail to undergo induction chemo with her not eating.   I then discussed with Frankey Poot that I would have to talk with patient myself so that she can be informed and make appropriate decision for herself .  Frankey Poot  told me not to speak with patient and wanted everything to go through to him so that he could "filter" information to the patient.  I explained to Frankey Poot that by law, patients who are competent need to make informed, consent decision about their treatment. Without my discussion with patient directly, informed consent will not be possible.    Then, Frankey Poot told me, "I forbid you to talk to my mother directly."   I recommended that patient's medical oncology care be transferred to Dr. Marcy Panning who was on call on Thu 08/11/2012 and was supposed to see this consult had I not got the call directly from Dr. [name].    I personally discussed the case with Dr. Humphrey Rolls who graciously agreed to take over the care of this patient.

## 2012-08-16 NOTE — Progress Notes (Signed)
08/16/12, Aida Raider RNC-MNN, BSN, [phone], CM received referral.  CM met with pt, pt's son, and pt's daughter in law to offer choice for [facility] services.  Family have used [facility] in the past for [facility] services and would like to use again.  Pt's son and daughter in law will be able to assist at home as needed.  Kristen at [facility] contacted with orders and confirmation of services received.  Lucretia at [facility] notified of DME needs at d/c.  Will follow.

## 2012-08-16 NOTE — Progress Notes (Signed)
Demonstrated technique for suctioning and cleaning trach to both daughters in law. Questions were answered and family was encouraged to practice suctioning and cleaning trach during night with RN supervision.  Both daughters in law stated they would do so and next shift nurse was notified of this.

## 2012-08-16 NOTE — Progress Notes (Signed)
MBS   MBS completed, full report to follow.  Pt presents with moderate oropharyngeal dysphagia - delayed oral transiting with lingual pumping noted.  Pharyngeal stasis present WITHOUT pt awareness that was decreased with dry swallows.  Suspect pt's mass is impacting her pharyngeal clearance.  Trace SILENT aspiration of thin and nectar liquids noted due to stasis on epiglottis and at pyriform sinus spilling into open larynx. Unfortunately this aspiration was not cleared with cued cough.  Cough is difficult to conduct due to pt report of broken ribs.   Pt became short of breath during testing and oxygen saturation monitoring took place.  Pt was asked if she desired suctioning and she complied. Pt was at 100% saturation throughout entire eval.  MD Dr Gerilyn Pilgrim tracheally suctioned pt with removal of viscous secretion.    Pt with improved breathing status after suctioning and agreed to proceed for a few more boluses only.   Advised pt and family to trace aspiration of liquids and secretions and strategies to maximize airway protection = not eliminate aspiration.  Further advised family and pt to awareness of discussion re: PEG.  Encouraged pt to consider to proceed with long term alt means to ensure adequate nutrition- given pt's dysphagia even prior to tx initiating.  Continuing pureed/thin currently with STRICT asp precautions and compensatory strategies may be most beneficial for this pt, even with the trace amount of aspiration observed.    SLP to follow pt for tolerance and education.  Advised family to speak to primary MD further questions regarding findings of test.    Luanna Salk, [facility] SLP [phone]

## 2012-08-16 NOTE — Progress Notes (Signed)
Patient is generally doing well this morning although she does have discomfort along her neck from her tracheostomy as expected. I spoke with her son, Frankey Poot, along with his mother and daughter-in-law. We are waiting on dental consultation with Dr. Enrique Sack. We also discussed her undergoing a PET scan when she is discharged and moving along with simulation/treatment planning. I outlined the need for approximately 7 weeks of radiation therapy along with chemotherapy and the the potential acute and late associated toxicities. I feel that she would benefit from a PEG tube. I will coordinate her care at Dr. Lamonte Sakai. She'll be presented at the head neck tumor board this Wednesday morning. I believe that she is appropriately depressed.  Addendum: I just spoke with Dr. Enrique Sack and he will  try to see her today.

## 2012-08-16 NOTE — Progress Notes (Signed)
I spoke with Dr. Lamonte Sakai this morning, and Dr. Humphrey Rolls this afternoon. It appears that she will move ahead with placement of a PEG tube which I feel is necessary for chemoradiation. As soon as I know the date of her expected discharge, I can get her scheduled for a PET scan. There is currently a one-week delay for getting a PET scan, but we may be able to expedite this once we know the date of her discharge. I'll monitor her progress and coordinate her care with Dr. Humphrey Rolls.

## 2012-08-16 NOTE — Consult Note (Signed)
DENTAL CONSULTATION  Date of Consultation:  08/16/2012 Patient Name:   [name] Date of Birth:   1946-07-31 Medical Record Number: [id number]  VITALS: BP 161/56  Pulse 69  Temp 99.4 F (37.4 C) (Oral)  Resp 16  Ht 5' (1.524 m)  Wt 112 lb 7 oz (51 kg)  BMI 21.96 kg/m2  SpO2 92%   CHIEF COMPLAINT: Patient referred for a pre-chemoradiation therapy dental protocol evaluation.  HPI: [name] is a 66 year old recently diagnosed with squamous cell carcinoma of the supraglottic larynx. Patient is status post tracheostomy with Dr. [name].  Patient is now seen as part of a pre-chemoradiation therapy dental protocol evaluation.  Patient indicates through interpretation by family that she no longer has teeth but does have four lower implants with upper complete denture and lower implant-retained complete denture. Implants placed by Dr. Alfred Levins in 2010. Lower implant-retained complete denture made by Dr. Dub Amis. Upper complete denture made 10-12 years ago by Dr. Raquel Sarna. Patient denies having any problems with the upper denture or lower implant-retained complete denture.  Patient has not seen Dr. Dub Amis after she had several adjustments for the lower implant-retained complete denture. Patient does not see Dr. Carlye Grippe for regular periodontal therapy of the implants.  Patient Active Problem List  Diagnosis  . Hypertension  . Anxiety  . Multiple closed fractures of ribs of left side  . 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 Tobacco abuse  . Hiatal hernia  . AKI (acute kidney injury)  . COPD (chronic obstructive pulmonary disease)  . Normocytic anemia  . Severe malnutrition secondary to acute illness   PMH: Past Medical History  Diagnosis Date  . Hypertension   . Hypercholesteremia   . Anxiety   . Stroke   . Uterine cancer   . COPD (chronic obstructive pulmonary disease) 08/10/2012  . Hiatal hernia 08/10/2012  . SCC (squamous cell  carcinoma) of supraglottis area 08/08/2012    PSH: Past Surgical History  Procedure Date  . Abdominal surgery     r/t uterine carcinoma  . Tracheostomy tube placement 08/10/2012    Procedure: TRACHEOSTOMY;  Surgeon: Jerrell Belfast, MD;  Location: [facility];  Service: ENT;  Laterality: N/A;  . Laryngoscopy 08/10/2012    Procedure: LARYNGOSCOPY;  Surgeon: Jerrell Belfast, MD;  Location: [facility];  Service: ENT;  Laterality: N/A;  with biopsy    ALLERGIES: No Known Allergies  MEDICATIONS: Current Facility-Administered Medications  Medication Dose Route Frequency Provider Last Rate Last Dose  . acetaminophen (TYLENOL) tablet 650 mg  650 mg Oral Q6H PRN Sheila Oats, MD       Or  . acetaminophen (TYLENOL) suppository 650 mg  650 mg Rectal Q6H PRN Adeline C Viyuoh, MD      . albuterol (PROVENTIL) (5 MG/ML) 0.5% nebulizer solution 2.5 mg  2.5 mg Nebulization Q4H PRN Nishant Dhungel, MD   2.5 mg at 08/10/12 0531  . amitriptyline (ELAVIL) tablet 10 mg  10 mg Oral QHS Sheila Oats, MD   10 mg at 08/15/12 2135  . antiseptic oral rinse (BIOTENE) solution 15 mL  15 mL Mouth Rinse q12n4p Venetia Maxon Rama, MD   15 mL at 08/14/12 1712  . chlorhexidine (PERIDEX) 0.12 % solution 15 mL  15 mL Mouth Rinse BID Venetia Maxon Rama, MD   15 mL at 08/15/12 2135  . dextrose 5 %-0.9 % sodium chloride infusion   Intravenous Continuous Venetia Maxon Rama, MD 50 mL/hr at 08/16/12 0903    .  enoxaparin (LOVENOX) injection 40 mg  40 mg Subcutaneous Q24H Jerrell Belfast, MD   40 mg at 08/16/12 1001  . feeding supplement (ENSURE COMPLETE) liquid 237 mL  237 mL Oral BID BM Linwood Dibbles, RD   237 mL at 08/15/12 1013  . hydrALAZINE (APRESOLINE) 20 MG/ML injection        5 mg at 08/16/12 0347  . hydrALAZINE (APRESOLINE) injection 5 mg  5 mg Intravenous Once Jeryl Columbia, NP   5 mg at 08/16/12 0344  . HYDROmorphone (DILAUDID) injection 0.5 mg  0.5 mg Intravenous Q2H PRN Venetia Maxon Rama, MD   0.5 mg at 08/16/12  1020  . ipratropium (ATROVENT) nebulizer solution 0.5 mg  0.5 mg Nebulization Q6H PRN Nishant Dhungel, MD   0.5 mg at 08/10/12 0531  . ketorolac (TORADOL) 30 MG/ML injection 15 mg  15 mg Intravenous Q6H PRN Christina P Rama, MD      . ketorolac (TORADOL) 30 MG/ML injection 30 mg  30 mg Intravenous Q6H PRN Venetia Maxon Rama, MD   30 mg at 08/16/12 0343  . lidocaine (XYLOCAINE) 2 % (with pres) injection 40 mg  2 mL Other Q4H PRN Jerrell Belfast, MD      . metoprolol (LOPRESSOR) injection 5 mg  5 mg Intravenous Q6H PRN Sheila Oats, MD   5 mg at 08/16/12 0252  . mirtazapine (REMERON SOL-TAB) disintegrating tablet 15 mg  15 mg Oral QHS Christina P Rama, MD      . nicotine (NICODERM CQ - dosed in mg/24 hours) patch 21 mg  21 mg Transdermal Daily Nishant Dhungel, MD   21 mg at 08/16/12 1000  . Olmesartan-Amlodipine-HCTZ 40-5-25 MG TABS 0.5 tablet  0.5 tablet Oral Daily Venetia Maxon Rama, MD   0.5 tablet at 08/16/12 1000  . ondansetron (ZOFRAN) tablet 4 mg  4 mg Oral Q6H PRN Adeline C Viyuoh, MD       Or  . ondansetron (ZOFRAN) injection 4 mg  4 mg Intravenous Q6H PRN Sheila Oats, MD   4 mg at 08/16/12 0815  . promethazine (PHENERGAN) injection 12.5 mg  12.5 mg Intravenous Once Jeryl Columbia, NP   12.5 mg at 08/15/12 2122  . ranitidine (ZANTAC) 150 MG/10ML syrup 150 mg  150 mg Oral Daily Venetia Maxon Rama, MD   150 mg at 08/15/12 1011    LABS: Lab Results  Component Value Date   WBC 8.4 08/13/2012   HGB 9.6* 08/13/2012   HCT 28.6* 08/13/2012   MCV 83.9 08/13/2012   PLT 240 08/13/2012      Component Value Date/Time   NA 141 08/13/2012 0350   K 3.7 08/13/2012 0350   CL 106 08/13/2012 0350   CO2 29 08/13/2012 0350   GLUCOSE 114* 08/13/2012 0350   BUN 5* 08/13/2012 0350   CREATININE 0.87 08/15/2012 0336   CALCIUM 8.8 08/13/2012 0350   GFRNONAA 68* 08/15/2012 0336   GFRAA 79* 08/15/2012 0336   Lab Results  Component Value Date   INR 1.04 10/30/2009   No results found for this basename: PTT     SOCIAL HISTORY: History   Social History  . Marital Status: Single    Spouse Name: N/A    Number of Children: 4  . Years of Education: N/A   Occupational History  .      retired Regulatory affairs officer   Social History Main Topics  . Smoking status: Current Everyday Smoker  . Smokeless tobacco: Not on file  .  Alcohol Use: No  . Drug Use: No  . Sexually Active:    Other Topics Concern  . Not on file   Social History Narrative  . No narrative on file    FAMILY HISTORY: No family history on file.   REVIEW OF SYSTEMS: Reviewed from chart for this admission.  DENTAL HISTORY: CHIEF COMPLAINT: Patient referred for a pre-chemoradiation therapy dental protocol evaluation.  HPI: Euretha Sroufe is a 66 year old recently diagnosed with squamous cell carcinoma of the supraglottic larynx. Patient is status post tracheostomy with Dr. [name].  Patient is now seen as part of a pre-chemoradiation therapy dental protocol evaluation.  Patient indicates through interpretation by family that she no longer has teeth but does have four lower implants with upper complete denture and lower implant-retained complete denture. Implants placed by Dr. Alfred Levins in 2010. Lower implant-retained complete denture made by Dr. Dub Amis. Upper complete denture made 10-12 years ago by Dr. Raquel Sarna. Patient denies having any problems with the upper denture or lower implant-retained complete denture.  Patient has not seen Dr. Dub Amis after she had several adjustments for the lower implant-retained complete denture. Patient does not see Dr. Carlye Grippe for regular periodontal therapy of the implants.  DENTAL EXAMINATION:  GENERAL: Patient is a well-developed, slightly built female in no acute distress. HEAD AND NECK: There is right neck lymphadenopathy palpated. Patient denies acute TMJ symptoms. INTRAORAL EXAM: Patient has normal saliva. All natural teeth are missing. Patient has four lower  implants in the approximate area of tooth numbers 20 through 27. DENTITION: Patient is edentulous. PERIODONTAL: Gingival tissues around the implants are slightly inflamed. There is no apparent mobility of the implants however. Suggest followup with Dr. Dub Amis for evaluation of the periodontal condition around the implants prior to chemoradiation therapy. PROSTHODONTIC: Upper complete denture appears to be stable and retentive. Lower implant-retained complete denture appears to be stable and retentive as well. I suggest evaluation by Dr. Dub Amis for evaluation of the prostheses and the implants. The patient has a history of not taking the dentures out at bedtime and this was recommended to be done at this time. OCCLUSION: The occlusion of the upper complete and lower implant-retained complete dentures appears to be acceptable. Patient denies problems with chewing.  RADIOGRAPHIC INTERPRETATION: An orthopantogram was obtained today. The patient is edentulous. The patient has 4 lower implants in the area of approximately  tooth #20 through 27. Atrophy of the edentulous alveolar ridges are noted.  ASSESSMENTS: 1. Patient is edentulous. 2. Patient has four lower implants in the area of #20 through 27. The implants appear to be stable and non-mobile. 3. Patient has an upper complete denture and a lower implant-retained complete denture that appear to be acceptable.  4. Patient has not had regular periodontal recall for the implants nor for the dentures and an appointment with Dr. Carlye Grippe was highly recommended.  PLAN/RECOMMENDATIONS: 1. I discussed the risks, benefits, and complications of various treatment options with the patient through an interpreter in relationship to her medical and dental conditions  and anticipated chemoradiation therapy and side effects to include xerostomia, mucositis, taste changes, gum and jhaw bone changes, and risk for infection and osteoradionecrosis . We  discussed various treatment options to include no treatment, followup with Dr. Dub Amis for evaluation of her dentures as well as the condition of the implants and need for periodontal therapy prior to the anticipated chemoradiation therapy. The patient currently is considering followup with Dr. Dub Amis for evaluation of  dentures and implants prior to anticipated chemoradiation therapy. Patient currently is cleared for chemoradiation therapy at this time . Patient is to contact dental medicine to arrange for an appointment if problems arise during the chemoradiation therapy, otherwise she will be seen approximately one month after the last radiation therapy has been completed.  2. Discussion of findings with medical team and coordination of future medical and dental care as indicated.    Lenn Cal, DDS

## 2012-08-16 NOTE — Progress Notes (Addendum)
[facility] PROGRESS NOTE  [name] [id number] DOB: 09-01-1946 DOA: 08/08/2012 PCP: Ann Gravel, MD   Brief narrative: 66 y/o spanish speaking female, originally from [place], who was admitted on 08/08/12 with left sided chest pain, dyspnea, NP cough and weight loss.  In the ED a CT angio of the chest was done and showed a soft tissue mass in the larynx just above the true cords compressing the airway. Fractures of the lateral aspect of the left fifth sixth and seventh ribs were noted.  A CT of the soft tissue neck was done and showed a 3.8 x 3.2 x 2.6 cm laryngeal mass above the level of the true cords involving the aryepiglottic folds, epiglottis, piriform sinuses and vallecula. It was noted to be necrotic on the left and causing marked airway narrowing -most compatible with a primary squamous cell cancer. Necrotic metastatic adenopathy on the right compatible with metastatic squamous cell cancer and COPD changes were noted.  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  She has been seen by radiation oncology and oncology with plans to proceed with larynx sparing therapy as an outpatient.  Dr. Enrique Sack will see her today, and Dr. Humphrey Rolls has assumed her oncology care.  Although she initially declined placement of a PEG tube, now is willing to proceed.   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  CT scans of the chest and neck confirmed a laryngeal mass.  Dr. [name] (ENT) saw patient in consultation on 08/09/12 and proceeded with diagnostic laryngoscopy, biopsy and placement of a tracheostomy tube for airway management on 08/10/12.  Biopsies confirmed SCC.  Continue supplemental oxygen via trach collar.  Nursing staff  working with family regarding teaching trach care.  Will need home health RN.    Diet advanced to pureed, tolerating well.    Dr. Enrique Sack to consult regarding dental implants and how these might impact her therapy.  So far, not available, likely due to holiday weekend.  Dr. Humphrey Rolls will assume oncology care of patient.  IR consulted for placement of a PEG tube.  Will need to be set up for outpatient PET scan prior to d/c. Active Problems:  Severe malnutrition secondary to acute illness  PO intake improving with pureed diet.  Will add Remeron to improve appetite stimulation (discussed with patient).  ST evaluation assessed swallowing function 08/13/12.  MBS recommended and ordered.  May need PEG, but patient declines at this time.  Normocytic anemia  Likely multifactorial with AOCD and surgical losses contributory.  COPD  Continue supplemental oxygen and nebulized bronchodilator therapy as needed.  Hypertension  Systolic BP elevated. Tribenzor resumed.  Continue lopressor IV PRN.  Multiple closed fractures of ribs of left side  Secondary to recent fall from her bed.  Continue pain control efforts.  Tobacco abuse  Counseled on smoking cessation.  Continue nicotine patch.  Hiatal hernia  Found incidentally on CT of the chest.  On Pepcid.  Acute kidney injury  Likely pre-renal in etiology, creatinine elevation responded to hydration.   Code Status: Full code  Family Communication: Multiple family updated at bedside, including son and daughter and their respective spouses. Disposition Plan: Home in next few days.   Medical Consultants:  Dr. [name], ENT.  Dr. Arloa Koh, Radiation Oncology  Dr. Sherryl Manges, Oncology (Care to be assumed by Dr. Marcy Panning 08/16/12).  Other Consultants:  Dietician: Ensure, magic cup ordered.  Speech therapy: Not ready for PMSV yet.  MBS recommended.  Occupational therapy: No DME needed.  Physical therapy: No further PT  needed.  Procedures:  Direct laryngoscopy with biopsy and tracheostomy tube placement done by Dr. [name] 08/10/12.  Antibiotics:  None  HPI/Subjective: [name] continues to have some pharyngeal pain at times.  Eating small amounts of pureed foods.  No dyspnea.  Occasional cough.   Objective: Filed Vitals:   08/16/12 0219 08/16/12 0330 08/16/12 0440 08/16/12 0840  BP: 175/76 180/69 161/56   Pulse: 76 80 70 69  Temp: 98.5 F (36.9 C)  99.4 F (37.4 C)   TempSrc: Oral  Oral   Resp: 16  14 16   Height:      Weight:      SpO2: 97%  96% 92%    Intake/Output Summary (Last 24 hours) at 08/16/12 1049 Last data filed at 08/15/12 1700  Gross per 24 hour  Intake    240 ml  Output      0 ml  Net    240 ml    Exam: Gen:  NAD Cardiovascular:  RRR, No M/R/G Respiratory: Lungs CTAB. Gastrointestinal: Abdomen soft, NT/ND with normal active bowel sounds. Extremities: No C/E/C  Data Reviewed: Basic Metabolic Panel:  Lab [id number] 08/13/12 0350 08/12/12 0313 08/11/12 0313 08/10/12 0303  NA -- 141 137 138 140  K -- 3.7 3.5 -- --  CL -- 106 104 105 106  CO2 -- 29 26 25 27   GLUCOSE -- 114* 129* 127* 120*  BUN -- 5* 6 9 13   CREATININE 0.87 0.85 0.86 0.85 0.89  CALCIUM -- 8.8 8.7 8.8 8.9  MG -- -- -- -- --  PHOS -- -- -- -- --   GFR Estimated Creatinine Clearance: 45.7 ml/min (by C-G formula based on Cr of 0.87). Liver Function Tests:  Lab 08/10/12 0303  AST 16  ALT 9  ALKPHOS 82  BILITOT 0.3  PROT 6.1  ALBUMIN 3.2*   CBC:  Lab 08/13/12 0350 08/12/12 0313 08/11/12 0313 08/10/12 0303  WBC 8.4 9.9 10.0 8.4  NEUTROABS -- -- -- --  HGB 9.6* 9.9* 10.8* 11.4*  HCT 28.6* 30.0* 32.1* 34.2*  MCV 83.9 83.8 83.6 84.4  PLT 240 240 260 269   Microbiology Recent Results (from the past 240 hour(s))  MRSA PCR SCREENING     Status: Normal   Collection Time   08/09/12  2:36 AM      Component Value Range Status Comment   MRSA by PCR NEGATIVE  NEGATIVE Final       Studies:  Ct Soft Tissue Neck W Contrast 08/08/2012   IMPRESSION:  1.  3.8 x 3.2 x 2.6 cm laryngeal mass above the level of the true cords, involving the aryepiglottic folds, epiglottis, piriform sinuses and vallecula.  This is necrotic on the left and is causing marked airway narrowing.  This is most compatible with a primary squamous cell carcinoma. 2.  Necrotic metastatic adenopathy on the right, compatible with metastatic squamous cell carcinoma. 3.  COPD.   Original Report Authenticated By: Gerald Stabs, M.D.     Ct Angio Chest Pe W/cm &/or Wo Cm 08/08/2012  IMPRESSION:  1.  There appears to be a soft tissue mass in the larynx just above the true cords compressing the airway. 2.  Fractures of the lateral aspects  of the left fifth, sixth, and seventh ribs. There may be a fracture of the anterior lateral aspect of the left eighth rib as well. 3.  No pulmonary emboli.   Original Report Authenticated By: Larey Seat, M.D.     Dg Chest Port 1 View 08/10/2012  IMPRESSION:  1.  Interval placement of a tracheostomy tube without radiographic evidence for complication. 2.  New left basilar airspace disease.  This likely reflects atelectasis.  Early infection or aspiration is not excluded.   Original Report Authenticated By: Resa Miner. MATTERN, M.D.    Scheduled Meds:    . amitriptyline  10 mg Oral QHS  . antiseptic oral rinse  15 mL Mouth Rinse q12n4p  . chlorhexidine  15 mL Mouth Rinse BID  . enoxaparin (LOVENOX) injection  40 mg Subcutaneous Q24H  . feeding supplement  237 mL Oral BID BM  . hydrALAZINE      . hydrALAZINE  5 mg Intravenous Once  . mirtazapine  15 mg Oral QHS  . nicotine  21 mg Transdermal Daily  . Olmesartan-Amlodipine-HCTZ  0.5 tablet Oral Daily  . promethazine  12.5 mg Intravenous Once  . ranitidine  150 mg Oral Daily   Continuous Infusions:    . dextrose 5 % and 0.9% NaCl 50 mL/hr at 08/16/12 0903    Time spent: 35 minutes.   LOS: 8 days    [facility] Pager [phone].  If 8PM-8AM, please contact night-coverage at [email or web address], password [facility] 08/16/2012, 10:49 AM

## 2012-08-16 NOTE — Procedures (Signed)
Objective Swallowing Evaluation: Modified Barium Swallowing Study  Patient Details  Name: [name] MRN: [id number] Date of Birth: Sep 10, 1946  Today's Date: 08/16/2012 Time: 1235-1303 SLP Time Calculation (min): 28 min  Past Medical History:  Past Medical History  Diagnosis Date  . Hypertension   . Hypercholesteremia   . Anxiety   . Stroke   . Uterine cancer   . COPD (chronic obstructive pulmonary disease) 08/10/2012  . Hiatal hernia 08/10/2012  . 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 Abdominal surgery     r/t uterine carcinoma  . Tracheostomy tube placement 08/10/2012    Procedure: TRACHEOSTOMY;  Surgeon: Jerrell Belfast, MD;  Location: [facility];  Service: ENT;  Laterality: N/A;  . Laryngoscopy 08/10/2012    Procedure: LARYNGOSCOPY;  Surgeon: Jerrell Belfast, MD;  Location: [facility];  Service: ENT;  Laterality: N/A;  with biopsy   HPI:  66 y/o spanish speaking female, originally from [place], who was admitted on 08/08/12 with left sided chest pain, dyspnea, NP cough and weight loss.  In the ED a CT angio of the chest was done and showed a soft tissue mass in the larynx just above the true cords compressing the airway. Fractures of the lateral aspect of the left fifth sixth and seventh ribs were noted.  A CT of the soft tissue neck was done and showed a 3.8 x 3.2 x 2.6 cm laryngeal mass above the level of the true cords involving the aryepiglottic folds, epiglottis, piriform sinuses and vallecula. It was noted to be necrotic on the left and causing marked airway narrowing -most compatible with a primary squamous cell cancer. Necrotic metastatic adenopathy on the right compatible with metastatic squamous cell cancer and COPD changes were noted.  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 Pt  has undergone BSE but concern for dysphagia and aspiration present therefore MBS was ordered.   Pt has not tolerated PMSV up to this point, likely due to size of mass impairing air flow.      Assessment / Plan / Recommendation Clinical Impression  Dysphagia Diagnosis: Mild oral phase dysphagia;Mild pharyngeal phase dysphagia Clinical impression: Mild oropharyngeal dysphagia noted, presence of mass results in mild to moderate pharyngeal stasis without pt awareness.  Dry swallows *cued and reflexive aid clearance.  Delayed oral transiting with lingual pumping noted that was not observed at bedside.  Pt acknowledges this oral delay only present today unable to indicate reasoning.   Trace aspiration of thin and nectar observed due to residuals at vallecular space spilling into open larynx and mixing with secretions.  Unfortunately, pt was not able to clear trace aspiration and complained of shortness of breath.  Oxygen saturation remained at 100% during this time and radiologist obliged to suction pt's trach with removal of viscous secretions.    Eating appears laborious for this pt.  Encouraged pt to consider alternative means of nutrition (as Dr Rockne Menghini had been discussing) due to already having some dysphagia prior to initiating radiation/chemo tx to ensure adequate nutrition and allow comfort po.  Advised pt and family to trace aspiration of liquids and secretions and strategies to maximize airway protection = not eliminate aspiration.    Suspect pt is having some aspiration of secretions- as viewed on MBS.  Continuing pureed/thin currently with STRICT asp precautions and compensatory strategies may be most  beneficial for this pt, even with the trace amount of aspiration observed- defer to MD.  SLP to follow.  Thanks.     Treatment Recommendation       Diet Recommendation Thin liquid;Dysphagia 1 (Puree)   Liquid Administration via: Cup Medication Administration: Crushed with puree Supervision: Patient  able to self feed;Full supervision/cueing for compensatory strategies Compensations: Slow rate;Small sips/bites;Multiple dry swallows after each bite/sip Postural Changes and/or Swallow Maneuvers: Seated upright 90 degrees;Upright 30-60 min after meal    Other  Recommendations Oral Care Recommendations: Oral care before and after PO   Follow Up Recommendations       Frequency and Duration min 2x/week  2 weeks   Pertinent Vitals/Pain Afebrile, decreased    SLP Swallow Goals Patient will consume recommended diet without observed clinical signs of aspiration with: Other (comment) Swallow Study Goal #1 - Progress: Discontinued (comment) (aspiration will not be fully eliminated) Patient will utilize recommended strategies during swallow to increase swallowing safety with: Modified independent assistance Swallow Study Goal #2 - Progress: Revised (modified due to lack of progress/goal met)   General HPI: 66 y/o spanish speaking female, originally from [place], who was admitted on 08/08/12 with left sided chest pain, dyspnea, NP cough and weight loss.  In the ED a CT angio of the chest was done and showed a soft tissue mass in the larynx just above the true cords compressing the airway. Fractures of the lateral aspect of the left fifth sixth and seventh ribs were noted.  A CT of the soft tissue neck was done and showed a 3.8 x 3.2 x 2.6 cm laryngeal mass above the level of the true cords involving the aryepiglottic folds, epiglottis, piriform sinuses and vallecula. It was noted to be necrotic on the left and causing marked airway narrowing -most compatible with a primary squamous cell cancer. Necrotic metastatic adenopathy on the right compatible with metastatic squamous cell cancer and COPD changes were noted.  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 Pt has undergone BSE but concern for  dysphagia and aspiration present therefore MBS was ordered.   Pt has not tolerated PMSV up to this point, likely due to size of mass impairing air flow.  Type of Study: Modified Barium Swallowing Study Reason for Referral: Objectively evaluate swallowing function Previous Swallow Assessment: BSE 08/13/12 Diet Prior to this Study: Thin liquids;Dysphagia 1 (puree) Respiratory Status: Trach History of Recent Intubation: No Behavior/Cognition: Cooperative;Alert;Pleasant mood Oral Cavity - Dentition: Adequate natural dentition Self-Feeding Abilities: Able to feed self Patient Positioning: Upright in chair Baseline Vocal Quality: Aphonic (trach, unable to tolerate PMSV) Volitional Cough: Congested;Strong Volitional Swallow: Able to elicit    Reason for Referral Objectively evaluate swallowing function   Oral Phase   Oral Phase Impaired  Pharyngeal Phase Pharyngeal Phase: Impaired   Cervical Esophageal Phase    GO    Cervical Esophageal Phase: WFL (esophageal cleared fully with sweep x3)    Macario Golds 08/16/2012, 4:27 PM

## 2012-08-16 NOTE — Progress Notes (Signed)
Nutrition Follow-up  Intervention: Will change Ensure Complete to PRN for when pt/family agreeable to pt trying it again. Ensure pudding TID for additional nutrition. Educated family on high calorie/protein foods for weight gain and provided handout of this information in [place] and [place]. Will monitor.   Diet Order: Dysphagia 1, thin liquid  - Pt found to have newly diagnosed stage IV supraglottic squamous cell CA and had a diagnostic laryngoscopy and tracheostomy tube placement. Met with pt and family who report pt's intake has been minimal - a few bites of purred chicken, potatoes, and eggs last night a sip of a purred fruit juice drink this morning. Family reports pt was drinking the Ensure for a few days without any problems but then developed bloating yesterday after drinking it and did not tolerate the [employer] well either. The hospitalist noted today that family declined PEG tube placement.   Meds: Scheduled Meds:   . amitriptyline  10 mg Oral QHS  . antiseptic oral rinse  15 mL Mouth Rinse q12n4p  . chlorhexidine  15 mL Mouth Rinse BID  . enoxaparin (LOVENOX) injection  40 mg Subcutaneous Q24H  . feeding supplement  237 mL Oral BID BM  . hydrALAZINE      . hydrALAZINE  5 mg Intravenous Once  . nicotine  21 mg Transdermal Daily  . Olmesartan-Amlodipine-HCTZ  0.5 tablet Oral Daily  . promethazine  12.5 mg Intravenous Once  . ranitidine  150 mg Oral Daily   Continuous Infusions:   . dextrose 5 % and 0.9% NaCl 50 mL/hr at 08/16/12 0903   PRN Meds:.acetaminophen, acetaminophen, albuterol, HYDROmorphone (DILAUDID) injection, ipratropium, ketorolac, lidocaine, metoprolol, ondansetron (ZOFRAN) IV, ondansetron  Labs:  CMP     Component Value Date/Time   NA 141 08/13/2012 0350   K 3.7 08/13/2012 0350   CL 106 08/13/2012 0350   CO2 29 08/13/2012 0350   GLUCOSE 114* 08/13/2012 0350   BUN 5* 08/13/2012 0350   CREATININE 0.87 08/15/2012 0336   CALCIUM 8.8 08/13/2012 0350   PROT 6.1  08/10/2012 0303   ALBUMIN 3.2* 08/10/2012 0303   AST 16 08/10/2012 0303   ALT 9 08/10/2012 0303   ALKPHOS 82 08/10/2012 0303   BILITOT 0.3 08/10/2012 0303   GFRNONAA 68* 08/15/2012 0336   GFRAA 79* 08/15/2012 0336     Intake/Output Summary (Last 24 hours) at 08/16/12 0943 Last data filed at 08/15/12 1700  Gross per 24 hour  Intake    240 ml  Output      0 ml  Net    240 ml   Last BM - 08/15/12  Weight Status:   8/27 112 lb 10.5 oz 9/1 112 lb 7 oz  Re-estimated needs:   1550-1750 calories 60-75g protein  Nutrition Dx:  Predicted suboptimal energy intake - ongoing  Goal: PO intake - not met, pt's intake minimal.   New goal: Pt to consume >50% of meals/supplements.   Monitor: Weights, labs, intake, swallowing ability   Dietitian# [phone]

## 2012-08-16 NOTE — H&P (Signed)
Chief Complaint: Dysphagia Referring Physician:Rama HPI: Ann Dinneen is an 66 y.o. female with new dx of laryngeal cancer. She underwent tracheostomy and is having significant dysphagia and IR is requested to place Gastrostomy feeding tube. PMHx reviewed. Previously on Plavix for prior CVA, but has been off at least 7 days.  Past Medical History:  Past Medical History  Diagnosis Date  . Hypertension   . Hypercholesteremia   . Anxiety   . Stroke   . Uterine cancer   . COPD (chronic obstructive pulmonary disease) 08/10/2012  . Hiatal hernia 08/10/2012  . 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 Abdominal surgery     r/t uterine carcinoma  . Tracheostomy tube placement 08/10/2012    Procedure: TRACHEOSTOMY;  Surgeon: Jerrell Belfast, MD;  Location: [facility];  Service: ENT;  Laterality: N/A;  . Laryngoscopy 08/10/2012    Procedure: LARYNGOSCOPY;  Surgeon: Jerrell Belfast, MD;  Location: [facility];  Service: ENT;  Laterality: N/A;  with biopsy    Family History: No family history on file.  Social History:  reports that she has been smoking.  She does not have any smokeless tobacco history on file. She reports that she does not drink alcohol or use illicit drugs.  Allergies: No Known Allergies  Medications: Medications Prior to Admission  Medication Sig Dispense Refill  . amitriptyline (ELAVIL) 10 MG tablet Take 10 mg by mouth at bedtime.      [name] atenolol (TENORMIN) 50 MG tablet Take 25 mg by mouth 2 (two) times daily. Take 1/2 tablet 25 mg total twice a day      . Olmesartan-Amlodipine-HCTZ 40-5-25 MG TABS Take 0.5 tablets by mouth daily.      . pravastatin (PRAVACHOL) 40 MG tablet Take 40 mg by mouth daily.        Please HPI for pertinent positives, otherwise complete 10 system ROS negative.  Physical Exam: Blood pressure 186/76, pulse 70, temperature 98.9 F (37.2 C), temperature source Oral,  resp. rate 18, height 5' (1.524 m), weight 112 lb 7 oz (51 kg), SpO2 97.00%. Body mass index is 21.96 kg/(m^2).   General Appearance:  Alert, cooperative, no distress, appears stated age  Head:  Normocephalic, without obvious abnormality, atraumatic  ENT: Unremarkable  Neck: Supple, symmetrical, tracheostomy looks clean  Lungs:   Clear to auscultation bilaterally, no w/r/r, respirations unlabored without use of accessory muscles.  Chest Wall:  No tenderness or deformity  Heart:  Regular rate and rhythm, S1, S2 normal, no murmur, rub or gallop. Carotids 2+ without bruit.  Abdomen:   Soft, non-tender, non distended. Bowel sounds active all four quadrants,  no masses, no organomegaly.  Neurologic: Normal affect, no gross deficits.   Results for orders placed during the hospital encounter of 08/08/12 (from the past 48 hour(s))  CREATININE, SERUM     Status: Abnormal   Collection Time   08/15/12  3:36 AM      Component Value Range Comment   Creatinine, Ser 0.87  0.50 - 1.10 mg/dL    GFR calc non Af Amer 68 (*) >90 mL/min    GFR calc Af Amer 79 (*) >90 mL/min    CBC    Component Value Date/Time   WBC 8.4 08/13/2012 0350   RBC 3.41* 08/13/2012 0350   HGB 9.6* 08/13/2012 0350   HCT 28.6* 08/13/2012 0350   PLT 240 08/13/2012 0350   MCV 83.9 08/13/2012 0350  MCH 28.2 08/13/2012 0350   MCHC 33.6 08/13/2012 0350   RDW 12.6 08/13/2012 0350   LYMPHSABS 2.3 08/08/2012 1840   MONOABS 0.8 08/08/2012 1840   EOSABS 0.2 08/08/2012 1840   BASOSABS 0.1 08/08/2012 1840   BMET    Component Value Date/Time   NA 141 08/13/2012 0350   K 3.7 08/13/2012 0350   CL 106 08/13/2012 0350   CO2 29 08/13/2012 0350   GLUCOSE 114* 08/13/2012 0350   BUN 5* 08/13/2012 0350   CREATININE 0.87 08/15/2012 0336   CALCIUM 8.8 08/13/2012 0350   GFRNONAA 68* 08/15/2012 0336   GFRAA 79* 08/15/2012 0336    Assessment/Plan Laryngeal cancer Dysphagia, increased risk of aspiration. Plan for perc G-tube placement. Discussed procedure,  including all risks, complications, and care expectations with pt family and they interpreted for her. Will check coags. Try po thin barium tonight, if she cannot tolerate, she may need barium enema at time of procedure and they understand that. Otherwise holding Lovenox tomorrow. Consent signed in chart.  Ascencion Dike PA-C 08/16/2012, 4:08 PM

## 2012-08-17 ENCOUNTER — Inpatient Hospital Stay (HOSPITAL_COMMUNITY): Payer: Medicaid Other

## 2012-08-17 ENCOUNTER — Other Ambulatory Visit: Payer: Self-pay | Admitting: Radiation Oncology

## 2012-08-17 ENCOUNTER — Encounter (HOSPITAL_COMMUNITY): Payer: Self-pay | Admitting: Anesthesiology

## 2012-08-17 DIAGNOSIS — N179 Acute kidney failure, unspecified: Secondary | ICD-10-CM

## 2012-08-17 DIAGNOSIS — C329 Malignant neoplasm of larynx, unspecified: Secondary | ICD-10-CM

## 2012-08-17 DIAGNOSIS — J449 Chronic obstructive pulmonary disease, unspecified: Secondary | ICD-10-CM

## 2012-08-17 DIAGNOSIS — F411 Generalized anxiety disorder: Secondary | ICD-10-CM

## 2012-08-17 LAB — APTT: aPTT: 32 seconds (ref 24–37)

## 2012-08-17 LAB — PROTIME-INR: Prothrombin Time: 15.1 seconds (ref 11.6–15.2)

## 2012-08-17 MED ORDER — FENTANYL CITRATE 0.05 MG/ML IJ SOLN
INTRAMUSCULAR | Status: AC | PRN
  Administered 2012-08-17 (×4): 50 ug via INTRAVENOUS

## 2012-08-17 MED ORDER — LIDOCAINE HCL 1 % IJ SOLN
INTRAMUSCULAR | Status: AC
  Filled 2012-08-17: qty 20

## 2012-08-17 MED ORDER — BACITRACIN-NEOMYCIN-POLYMYXIN OINTMENT TUBE
TOPICAL_OINTMENT | Freq: Every day | CUTANEOUS | Status: DC
  Administered 2012-08-19 – 2012-08-21 (×3): via TOPICAL
  Filled 2012-08-17: qty 15

## 2012-08-17 MED ORDER — HYDRALAZINE HCL 20 MG/ML IJ SOLN
5.0000 mg | Freq: Once | INTRAMUSCULAR | Status: AC
  Administered 2012-08-17: 5 mg via INTRAVENOUS
  Filled 2012-08-17: qty 1

## 2012-08-17 MED ORDER — FENTANYL CITRATE 0.05 MG/ML IJ SOLN
INTRAMUSCULAR | Status: AC
  Filled 2012-08-17: qty 6

## 2012-08-17 MED ORDER — IOHEXOL 300 MG/ML  SOLN
50.0000 mL | Freq: Once | INTRAMUSCULAR | Status: AC | PRN
  Administered 2012-08-17: 15 mL via INTRAVENOUS

## 2012-08-17 MED ORDER — HYDRALAZINE HCL 20 MG/ML IJ SOLN: 10.0000 mg | Freq: Four times a day (QID) | INTRAMUSCULAR | Status: DC | PRN

## 2012-08-17 MED ORDER — HYDRALAZINE HCL 20 MG/ML IJ SOLN
10.0000 mg | Freq: Four times a day (QID) | INTRAMUSCULAR | Status: DC | PRN
  Administered 2012-08-17 – 2012-08-18 (×3): 10 mg via INTRAVENOUS
  Filled 2012-08-17 (×4): qty 1

## 2012-08-17 MED ORDER — MIDAZOLAM HCL 5 MG/5ML IJ SOLN
INTRAMUSCULAR | Status: AC | PRN
  Administered 2012-08-17 (×4): 1 mg via INTRAVENOUS

## 2012-08-17 MED ORDER — MIDAZOLAM HCL 2 MG/2ML IJ SOLN
INTRAMUSCULAR | Status: AC
  Filled 2012-08-17: qty 6

## 2012-08-17 NOTE — Progress Notes (Signed)
Prior to receiving sedatives for IR procedure, patient stated that she did not want to go to MRI following her procedure in IR, and wanted the IR staff to promise not to take her to MRI after she was sedated.  Patient's son was translating for the patient. We called MRI to let them know that the patient was refusing the MRI.  Patient's family aware that we will bring patient back to her inpatient room following her gastrostomy placement.

## 2012-08-17 NOTE — Progress Notes (Signed)
PET scan scheduled on Tuesday September 10 at 1:15pm as outpatient.

## 2012-08-17 NOTE — Procedures (Signed)
Interventional Radiology Procedure Note  Procedure: Placement of percutaneous gastrostomy tube Complications: None Recommendations: - Keep to LWS overnight - NPO except for chips and sips and nothing into tube until noon tomorrow, then may begin using tube for feeds and meds  Signed,  Criselda Peaches, MD Vascular & Interventional Radiologist [facility]

## 2012-08-17 NOTE — Progress Notes (Signed)
Pt was not on floor for her 12:00 and 4:00 trach ck.

## 2012-08-17 NOTE — Progress Notes (Signed)
I just spoke with Dr. Pablo Ledger who attended the head and neck cancer conference this morning. Radiology now feels that there is cartilage invasion which was not initially reported, and Dr. [name] plans on obtaining a MRI scan to confirm. If there is cartilage invasion, then my  recommendation is for laryngectomy with postoperative radiation therapy with or without chemo.

## 2012-08-17 NOTE — Progress Notes (Signed)
SLP Cancellation Note  Treatment cancelled today due to patient receiving procedure or test.  Pt NPO for PEG today.  SLP to return another day for tx.  Thanks.    Luanna Salk, [place] [facility] SLP [phone]

## 2012-08-17 NOTE — Progress Notes (Signed)
Spoke with daughter-in-law this morning. PEG and IV access port scheduled. Assuming discharge by later this week, I will get patient scheduled for PET scan early next week here a [facility]. Discussed case and management with Dr. Humphrey Rolls. If simulation/treatment planning performed mid next week, we are looking at starting chemo/XRT the week of September 23 (need 7-10 days for IMRT planning/QA process).

## 2012-08-17 NOTE — Progress Notes (Signed)
[facility] PROGRESS NOTE  [name] [id number] DOB: Apr 11, 1946 DOA: 08/08/2012 PCP: [name], MD  Brief narrative: 66 year old female admitted on 08/08/12 with left sided chest pain, dyspnea, non productive cough and weight loss. In the ED a CT angio of the chest showed necrotic laryngeal mass likely SCC compressing the airway and metastatic adenopathy. Patient is now status post diagnostic laryngoscopy, biopsy, and tracheostomy tube placement, and carries a biopsy proven diagnosis of right supraglottic SCC. Plan for MRI neck and possible laryngectomy with radiation therapy.  Assessment/Plan:  Principal Problem:  *Laryngeal mass secondary to [facility] of supraglottic area  Per Dr. [name] (ENT) plan for MRI neck and if cartilage invasion will proceed with laryngectomy and radiation Status post laryngoscopy and tracheostomy  Continue supplemental oxygen via trach collar.   Dr. Enrique Sack to consult regarding dental implants and how these might impact her therapy. Dr. Humphrey Rolls will assume oncology care of patient.  IR consulted for placement of a PEG tube.  Will need to be set up for outpatient PET scan prior to d/c.  Active Problems:  Severe malnutrition secondary to acute illness  High risk of aspiration Will need PEG tube placement for temporary nutritional support Normocytic anemia  Likely multifactorial with AOCD  Follow up CBC in am COPD  Continue supplemental oxygen  nebulized bronchodilator therapy as needed. Hypertension  BP 165/65  Continue lopressor IV PRN. Add hydralazine IV PRN Multiple closed fractures of ribs of left side  Secondary to recent fall from her bed.  Continue pain control efforts. Tobacco abuse  Counseled on smoking cessation.  Continue nicotine patch. Hiatal hernia  Found incidentally on CT of the chest.  Continue Pepcid. Acute kidney injury  Likely pre-renal in etiology Follow up BMP in am  Code Status: Full code  Family Communication:  updated daughter in law at bedside  Disposition Plan: Home when stable  Medical Consultants:  Dr. [name], ENT.  Dr. Arloa Koh, Radiation Oncology  Dr. Sherryl Manges, Oncology (Care to be assumed by Dr. Marcy Panning 08/16/12). Other Consultants:  Dietician: Ensure, magic cup ordered.  Speech therapy: Not ready for PMSV yet. MBS recommended.  Occupational therapy: No DME needed.  Physical therapy: No further PT needed. Procedures:  Direct laryngoscopy with biopsy and tracheostomy tube placement done by Dr. [name] 08/10/12. Antibiotics:  None  Leisa Lenz, MD  [facility] Pager [phone]  If 7PM-7AM, please contact night-coverage [email or web address] Password [email or web address] 08/17/2012, 2:00 PM   LOS: 9 days   HPI/Subjective: No acute events overnight.  Objective: Filed Vitals:   08/17/12 0803 08/17/12 1050 08/17/12 1142 08/17/12 1236  BP:  188/76 184/70 165/65  Pulse: 63 83    Temp:      TempSrc:      Resp: 18 18    Height:      Weight:      SpO2: 99% 99%     No intake or output data in the 24 hours ending 08/17/12 1400  Exam:   General:  Pt is alert, follows commands appropriately, not in acute distress  Cardiovascular: Regular rate and rhythm, S1/S2, no murmurs, no rubs, no gallops  Respiratory: (+) trach; coarse breath sounds  Abdomen: Soft, non tender, non distended, bowel sounds present, no guarding  Extremities: No edema, pulses DP and PT palpable bilaterally  Neuro: Grossly nonfocal  Data Reviewed: Basic Metabolic Panel:  Lab [id number] 08/13/12 0350 08/12/12 0313 08/11/12 0313  NA -- 141 137 138  K -- 3.7 3.5 3.5  CL -- 106 104 105  CO2 -- 29 26 25   GLUCOSE -- 114* 129* 127*  BUN -- 5* 6 9  CREATININE 0.87 0.85 0.86 0.85  CALCIUM -- 8.8 8.7 8.8  MG -- -- -- --  PHOS -- -- -- --   Liver Function Tests: No results found for this basename: AST:5,ALT:5,ALKPHOS:5,BILITOT:5,PROT:5,ALBUMIN:5 in the last 168 hours No results found for this  basename: LIPASE:5,AMYLASE:5 in the last 168 hours No results found for this basename: AMMONIA:5 in the last 168 hours CBC:  Lab 08/13/12 0350 08/12/12 0313 08/11/12 0313  WBC 8.4 9.9 10.0  NEUTROABS -- -- --  HGB 9.6* 9.9* 10.8*  HCT 28.6* 30.0* 32.1*  MCV 83.9 83.8 83.6  PLT 240 240 260   Cardiac Enzymes: No results found for this basename: CKTOTAL:5,CKMB:5,CKMBINDEX:5,TROPONINI:5 in the last 168 hours BNP: No components found with this basename: POCBNP:5 CBG: No results found for this basename: GLUCAP:5 in the last 168 hours  Recent Results (from the past 240 hour(s))  MRSA PCR SCREENING     Status: Normal   Collection Time   08/09/12  2:36 AM      Component Value Range Status Comment   MRSA by PCR NEGATIVE  NEGATIVE Final      Studies: Dg Swallowing Func-no Report  08/16/2012  CLINICAL DATA: Eval swallowing per ST recs.   FLUOROSCOPY FOR SWALLOWING FUNCTION STUDY:  Fluoroscopy was provided for swallowing function study, which was  administered by a speech pathologist.  Final results and recommendations  from this study are contained within the speech pathology report.      Scheduled Meds:   . amitriptyline  10 mg Oral QHS  . antiseptic oral rinse  15 mL Mouth Rinse q12n4p  .  ceFAZolin (ANCEF) IV  1 g Intravenous On Call  . chlorhexidine  15 mL Mouth Rinse BID  . enoxaparin (LOVENOX) injection  40 mg Subcutaneous Q24H  . feeding supplement  1 Container Oral TID BM  . hydrALAZINE  5 mg Intravenous Once  . hydrALAZINE  5 mg Intravenous Once  . mirtazapine  15 mg Oral QHS  . nicotine  21 mg Transdermal Daily  . Olmesartan-Amlodipine-HCTZ  0.5 tablet Oral Daily  . ranitidine  150 mg Oral Daily   Continuous Infusions:   . dextrose 5 % and 0.9% NaCl 50 mL/hr at 08/17/12 [phone]

## 2012-08-17 NOTE — Progress Notes (Signed)
   ENT Progress Note: POD # 7 s/p Procedure(s): TRACHEOSTOMY LARYNGOSCOPY   Subjective: Stable, airway intact  Objective: Vital signs in last 24 hours: Temp:  [98.3 F (36.8 C)-99.5 F (37.5 C)] 98.3 F (36.8 C) (09/04 0557) Pulse Rate:  [63-93] 63  (09/04 0803) Resp:  [14-20] 18  (09/04 0803) BP: (156-191)/(55-85) 173/60 mmHg (09/04 0557) SpO2:  [96 %-100 %] 99 % (09/04 0803) FiO2 (%):  [25 %-28 %] 28 % (09/04 0803) Weight change:  Last BM Date: 08/15/12  Intake/Output from previous day: 09/03 0701 - 09/04 0700 In: 2433.3 [P.O.:60; I.V.:2373.3] Out: -  Intake/Output this shift:    Labs: No results found for this basename: WBC:2,HGB:2,HCT:2,PLT:2 in the last 72 hours No results found for this basename: NA:2,K:2,CL:2,CO2:2,GLUCOSE:2,BUN:2,CREATININR:2,CALCIUM:2 in the last 72 hours  Studies/Results: Dg Swallowing Func-no Report  08/16/2012  CLINICAL DATA: Eval swallowing per ST recs.   FLUOROSCOPY FOR SWALLOWING FUNCTION STUDY:  Fluoroscopy was provided for swallowing function study, which was  administered by a speech pathologist.  Final results and recommendations  from this study are contained within the speech pathology report.       PHYSICAL EXAM: Trach intact  Airway stable   Assessment/Plan: Pt scheduled to undergo PEG today and porta-cath on 9/5.   Pt presented at [facility] Conf this am: ? Tumor invasion of Lt paraglottic space and thyroid cartilage (stage IV?), plan MRI scan of larynx to assess, pt will need PET scan as OP.  Cont trach care and teaching, plan d/c when stable after procedures and OP treatment plan in-place.      [place], [name] 08/17/2012, 9:03 AM

## 2012-08-17 NOTE — Progress Notes (Signed)
BP 188/76. MD notified. Orders received . Will continue to monitor.

## 2012-08-18 LAB — BASIC METABOLIC PANEL
CO2: 31 mEq/L (ref 19–32)
Chloride: 101 mEq/L (ref 96–112)
GFR calc Af Amer: 60 mL/min — ABNORMAL LOW (ref >90)
Potassium: 3.4 mEq/L — ABNORMAL LOW (ref 3.5–5.1)
Sodium: 139 mEq/L (ref 135–145)

## 2012-08-18 LAB — CBC
MCV: 83.5 fL (ref 78.0–100.0)
Platelets: 290 10*3/uL (ref 150–400)
RBC: 3.45 MIL/uL — ABNORMAL LOW (ref 3.87–5.11)
RDW: 12.6 % (ref 11.5–15.5)
WBC: 8.1 10*3/uL (ref 4.0–10.5)

## 2012-08-18 MED ORDER — HYDROMORPHONE HCL PF 1 MG/ML IJ SOLN
1.0000 mg | INTRAMUSCULAR | Status: DC | PRN
  Administered 2012-08-18 – 2012-08-22 (×29): 1 mg via INTRAVENOUS
  Filled 2012-08-18 (×31): qty 1

## 2012-08-18 MED ORDER — JEVITY 1.2 CAL PO LIQD
1000.0000 mL | ORAL | Status: DC
  Administered 2012-08-18: 1000 mL

## 2012-08-18 MED ORDER — HYDROMORPHONE HCL PF 1 MG/ML IJ SOLN
1.0000 mg | Freq: Once | INTRAMUSCULAR | Status: AC
  Administered 2012-08-18: 1 mg via INTRAVENOUS
  Filled 2012-08-18: qty 1

## 2012-08-18 MED ORDER — FREE WATER
30.0000 mL | Freq: Four times a day (QID) | Status: DC
  Administered 2012-08-18 – 2012-08-19 (×4): 60 mL
  Administered 2012-08-20: 30 mL
  Administered 2012-08-20 – 2012-08-22 (×6): 60 mL
  Administered 2012-08-22: 40 mL

## 2012-08-18 MED ORDER — LORAZEPAM 0.5 MG PO TABS
0.5000 mg | ORAL_TABLET | Freq: Two times a day (BID) | ORAL | Status: DC | PRN
  Administered 2012-08-18 – 2012-08-20 (×4): 0.5 mg via ORAL
  Filled 2012-08-18 (×5): qty 1

## 2012-08-18 MED ORDER — POTASSIUM CHLORIDE 10 MEQ/100ML IV SOLN
10.0000 meq | INTRAVENOUS | Status: AC
  Administered 2012-08-18 (×3): 10 meq via INTRAVENOUS
  Filled 2012-08-18 (×3): qty 100

## 2012-08-18 MED ORDER — HYDROCODONE-ACETAMINOPHEN 5-325 MG PO TABS
1.0000 | ORAL_TABLET | Freq: Four times a day (QID) | ORAL | Status: AC | PRN
  Administered 2012-08-18 – 2012-08-19 (×3): 2 via ORAL
  Filled 2012-08-18 (×4): qty 2

## 2012-08-18 NOTE — Progress Notes (Signed)
I met with [name] and his wife this morning to review CT  and MRI scans of neck which indicate thyroid cartilage involvement. Just spoke with Dr. Landry Dyke and we are in agreement that laryngectomy/R neck dissection is standard of care followed by post op XRT with or without chemotherapy. She will need pre-op PET scan to be done as IP if surgery schedule during this hospitalization, or as outpatient if she will be discharged before returning for surgery.

## 2012-08-18 NOTE — Progress Notes (Signed)
[facility] PROGRESS NOTE  [name] [id number] DOB: 02-Sep-1946 DOA: 08/08/2012 PCP: [name], MD  Brief narrative: 66 year old pleasant female admitted on 08/08/12 with left sided chest pain, dyspnea, non productive cough and weight loss. In the ED a CT angio of the chest showed necrotic laryngeal mass likely SCC compressing the airway and metastatic adenopathy. Patient is now status post diagnostic laryngoscopy, biopsy, and tracheostomy tube placement, and carries a biopsy proven diagnosis of right supraglottic SCC. Patient is now status post a tube insertion. The plan is for laryngectomy and post operative radiation therapy.  Assessment/Plan:   Principal Problem:  *Laryngeal mass secondary to [facility] of supraglottic area  Per Dr. [name] (ENT) plan for laryngectomy and radiation  MRI neck attempted 08/17/2012, only 2 images taken which was suggestive of stage 4 laryngeal squamous cell carcinoma Status post laryngoscopy and tracheostomy  Continue supplemental oxygen via trach collar.  Dr. Enrique Sack to consult regarding dental implants and how these might impact her therapy.  Dr. Humphrey Rolls will assume oncology care of patient.  Either outpatient or inpatient PET scan depending on when the surgery will take place  Active Problems:  Severe malnutrition secondary to acute illness  Status post PEG tube basement Normocytic anemia  Likely secondary to anemia of chronic disease secondary to malignancy Hemoglobin stable at 9.5 COPD  Continue supplemental oxygen  nebulized bronchodilator therapy as needed. Hypertension  BP 158/67 Continue lopressor IV PRN.  Added hydralazine IV PRN Multiple closed fractures of ribs of left side  Secondary to recent fall from her bed.  Increased Dilaudid to 1 mg every 2 hours as needed for pain Tobacco abuse  Counseled on smoking cessation.  Continue nicotine patch. Hiatal hernia  Found incidentally on CT of the chest.  Will continue  Pepcid Acute kidney injury  Likely pre-renal in etiology  Creatinine is now within normal limits Hypokalemia  Repleted today with IV  Followup BMP in the morning  Code Status: Full code  Family Communication: updated family at bedside  Disposition Plan: Home when stable   Medical Consultants:  Dr. [name], ENT.  Dr. Arloa Koh, Radiation Oncology  Dr. Sherryl Manges, Oncology (Care to be assumed by Dr. Marcy Panning 08/16/12). Other Consultants:  Dietician: Ensure, magic cup ordered.  Speech therapy: Not ready for PMSV yet. MBS recommended.  Occupational therapy: No DME needed.  Physical therapy: No further PT needed. Procedures:  Direct laryngoscopy with biopsy and tracheostomy tube placement done by Dr. [name] 08/10/12. Antibiotics:  None  Leisa Lenz, MD  [facility]  Pager [phone]   If 7PM-7AM, please contact night-coverage [email or web address] Password [email or web address] 08/18/2012, 10:16 AM   LOS: 10 days   HPI/Subjective: No acute events overnight. Patient does have pain at PEG tube insertion site as well as right side of the rib cage.  Objective: Filed Vitals:   08/18/12 0453 08/18/12 0604 08/18/12 0740 08/18/12 0756  BP:  158/67    Pulse: 75 90 88   Temp:  98.4 F (36.9 C)    TempSrc:  Oral    Resp: 18 18 18    Height:      Weight:      SpO2: 96% 96% 94% 97%    Intake/Output Summary (Last 24 hours) at 08/18/12 1016 Last data filed at 08/18/12 0715  Gross per 24 hour  Intake    970 ml  Output    450 ml  Net    520 ml    Exam:   General:  Pt  is alert, follows commands appropriately, not in acute distress  Cardiovascular: Regular rate and rhythm, S1/S2, no murmurs, no rubs, no gallops  Respiratory: (+) trach; Clear to auscultation bilaterally, no wheezing, no crackles, no rhonchi  Abdomen: Soft, tender at peg tube insertion, non distended, bowel sounds present, no guarding  Extremities: No edema, pulses DP and PT palpable bilaterally  Neuro:  Grossly nonfocal  Data Reviewed: Basic Metabolic Panel:  Lab Q000111Q 0325 08/13/12 0350 08/12/12 0313  NA 139 141 137  K 3.4* 3.7 3.5  CL 101 106 104  CO2 31 29 26   GLUCOSE 104* 114* 129*  BUN 10 5* 6  CREATININE 1.09 0.85 0.86  CALCIUM 9.0 8.8 8.7   CBC:  Lab 08/18/12 0325 08/13/12 0350 08/12/12 0313  WBC 8.1 8.4 9.9  HGB 9.5* 9.6* 9.9*  HCT 28.8* 28.6* 30.0*  MCV 83.5 83.9 83.8  PLT 290 240 240     MRSA by PCR NEGATIVE  NEGATIVE Final     Studies: Ir Gastrostomy Tube Mod Sed 08/17/2012  * IMPRESSION:  Successful placement of a 20 French pull through gastrostomy tube.  The tube will be ready for use 5,000,000 tomorrow (08/18/2012).    Mr Neck Soft Tissue Only Wo Contrast 08/17/2012  *  IMPRESSION: 1.  The examination had to be discontinued prior to completion due to patient request. 2.  MRI evidence of right paraglottic soft tissue involvement by the supraglottic mass, favor T4 tumor stage.     Dg Swallowing Func-no Report 08/16/2012  CLINICAL DATA: Eval swallowing per ST recs.   FLUOROSCOPY FOR SWALLOWING FUNCTION STUDY:  Fluoroscopy was provided for swallowing function study, which was  administered by a speech pathologist.  Final results and recommendations  from this study are contained within the speech pathology report.      Scheduled Meds:   . amitriptyline  10 mg Oral QHS  .  ceFAZolin (ANCEF) IV  1 g Intravenous On Call  . enoxaparin (LOVENOX)   40 mg Subcutaneous Q24H  . feeding supplement  1 Container Oral TID BM  . mirtazapine  15 mg Oral QHS  . neomycin-bacitracin-polymyxin   Topical Daily  . nicotine  21 mg Transdermal Daily  . Olmesartan-Amlodipine-HCTZ  0.5 tablet Oral Daily  . potassium chloride  10 mEq Intravenous Q1 Hr x 3  . ranitidine  150 mg Oral Daily   Continuous Infusions:   . dextrose 5 % and 0.9% NaCl 1,000 mL (08/18/12 0347)

## 2012-08-18 NOTE — Progress Notes (Signed)
SLP Note  SLP follow up after MBS and s/p PEG placement.   Note pt now npo except ice chips and per ENT documentation plan for laryngectomy and radiation.    SLP to follow up with MD approval to educate pt/family regarding laryngectomy impact on communication, etc.    Also ? If pt could return to small amounts of po (puree/thin) to prevent disuse atrophy and improve QOL.    SLP will cease trials of PMSV given her inability to redirect air into upper airway and plan for laryngectomy.          Luanna Salk, [place] [facility] SLP [phone]

## 2012-08-18 NOTE — Progress Notes (Addendum)
8 Days Post-Op  Subjective: Family present - patient tender at gastric tube insertion site.   Objective: Vital signs in last 24 hours: Temp:  [98.3 F (36.8 C)-99.6 F (37.6 C)] 98.4 F (36.9 C) (09/05 0604) Pulse Rate:  [71-90] 88  (09/05 0740) Resp:  [16-26] 18  (09/05 0740) BP: (158-190)/(63-85) 158/67 mmHg (09/05 0604) SpO2:  [94 %-100 %] 97 % (09/05 0756) FiO2 (%):  [28 %-50 %] 28 % (09/05 0756) Last BM Date: 08/15/12  Intake/Output from previous day: 09/04 0701 - 09/05 0700 In: 520 [P.O.:120; I.V.:400] Out: 450 [Urine:450] Intake/Output this shift: Total I/O In: 450 [I.V.:450] Out: -  Physical exam : awake, alert, tearful.  Gastric tube to suction currently. Red bilious drainage - had jello last pm.  Insertion site clean and dry. Bumper tight and tender with manipulation. No hematoma noted. Abdomen is soft with hypoactive bowel sounds currently. Having some spasms in lower pelvic area. Guarding to palpation near lower pelvis/ bladder area but can gently palpate which reveals no obvious mass/hematoma.   Lab Results:   [facility] 08/18/12 0325  WBC 8.1  HGB 9.5*  HCT 28.8*  PLT 290   BMET  Basename 08/18/12 0325  NA 139  K 3.4*  CL 101  CO2 31  GLUCOSE 104*  BUN 10  CREATININE 1.09  CALCIUM 9.0   PT/INR  Basename 08/17/12 0330  LABPROT 15.1  INR 1.17    Studies/Results: Ir Gastrostomy Tube Mod Sed  08/17/2012  *RADIOLOGY REPORT*  PULL THROUGH GASTROSTOMY TUBE PLACEMENT UNDER FLUOROSCOPIC GUIDANCE  Date: 08/17/2012  Clinical History: 66 year old female with laryngeal cancer and dysphagia.  Percutaneous gastrostomy tube required for parenteral feeding during upcoming chemo and radiation therapy.  Procedures Performed:  1.  Fluoroscopically guided placement of percutaneous pull-through gastrostomy tube.  Interventional Radiologist:  Criselda Peaches, MD  Sedation: Moderate (conscious) sedation was used.  For mg Versed, 200 mcg Fentanyl were administered  intravenously.  The patient's vital signs were monitored continuously by radiology nursing throughout the procedure.  Sedation Time: 25 minutes  Fluoroscopy time: 3.4 minutes  Contrast volume: 15 ml Omnipaque 300 administered into the stomach  Antibiotics:  1g Ancef was administered intravenously within 1 hour of skin incision.  PROCEDURE/FINDINGS:   Informed consent was obtained from the patient following explanation of the procedure, risks, benefits and alternatives. The patient understands, agrees and consents for the procedure. All questions were addressed. A time out was performed.  Maximal barrier sterile technique utilized including caps, mask, sterile gowns, sterile gloves, large sterile drape, hand hygiene, and chlorhexadine skin prep.  An angled catheter was advanced over a wire under fluoroscopic guidance through the nose, down the esophagus and into the body of the stomach.  The stomach was then insufflated with several 100 ml of air.  Fluoroscopy confirmed location of the gastric bubble, as well as inferior displacement of the barium stained colon.  Under direct fluoroscopic guidance, a single T-tack was placed, and the anterior gastric wall drawn up against the anterior abdominal wall. Percutaneous access was then obtained into the mid gastric body with an 18 gauge sheath needle.  Aspiration of air, and injection of contrast material under fluoroscopy confirmed needle placement.  An Amplatz wire was advanced in the gastric body and the access needle exchanged for a 9-French vascular sheath.  A snare device was advanced through the vascular sheath and an Amplatz wire advanced through the angled catheter.  The Amplatz wire was successfully snared and this was pulled up  through the esophagus and out the mouth.  A 20-French Alinda Dooms MIC-PEG tube was then connected to the snare and pulled through the mouth, down the esophagus, into the stomach and out to the anterior abdominal wall. Hand injection of  contrast material confirmed intragastric location. The T-tack retention suture was then cut. The pull through peg tube was then secured with the external bumper and capped.  The patient will be observed for several hours with the newly placed tube on low wall suction to evaluate for any post procedure complication.  The patient tolerated the procedure well, there is no immediate complication.  IMPRESSION:  Successful placement of a 20 French pull through gastrostomy tube.  The tube will be ready for use 5,000,000 tomorrow (08/18/2012).  Signed,  Criselda Peaches, MD Vascular & Interventional Radiologist [facility]   Original Report Authenticated By: Myrle Sheng    Mr Neck Soft Tissue Only Wo Contrast  08/17/2012  *RADIOLOGY REPORT*  Clinical Data:  66 year old female with bulky supraglottic larynx mass.  Question paraglottic involvement.  Airway compromise, but now status post tracheostomy.  Despite multiple attempts, both supine and decubitus, the patient was unable to tolerate MRI imaging.  The patient refused a planned repeat attempt with conscious sedation.  MRI NECK WITHOUT CONTRAST  Technique:  Multiplanar, multiecho pulse sequences of the neck and surrounding structures were obtained without intravenous contrast.  Comparison:  CT neck with contrast 08/08/2012.  Findings:  Axial T2 and only several axial T1-weighted noncontrast images of the neck are provided.  The bulky right greater than left supraglottic mass is heterogeneously T2 hyperintense and hypointense on T1-weighted imaging.  Similar signal in the bulky right level II lymphadenopathy is noted.  On axial T1 image 6 of series 4 there is loss of the right paraglottic fat signal.  This is in the area at or just superior to the right thyroid cartilage.  IMPRESSION: 1.  The examination had to be discontinued prior to completion due to patient request. 2.  MRI evidence of right paraglottic soft tissue involvement by the supraglottic mass, favor T4  tumor stage.   Original Report Authenticated By: Randall An, M.D.    Dg Swallowing Func-no Report  08/16/2012  CLINICAL DATA: Eval swallowing per ST recs.   FLUOROSCOPY FOR SWALLOWING FUNCTION STUDY:  Fluoroscopy was provided for swallowing function study, which was  administered by a speech pathologist.  Final results and recommendations  from this study are contained within the speech pathology report.      Anti-infectives: Anti-infectives     Start     Dose/Rate Route Frequency Ordered Stop   08/17/12 0600   ceFAZolin (ANCEF) IVPB 1 g/50 mL premix     Comments: Start on call to IR procedure      1 g 100 mL/hr over 30 Minutes Intravenous On call 08/16/12 1614 08/17/12 1611   08/10/12 1500   ceFAZolin (ANCEF) IVPB 2 g/50 mL premix        2 g 100 mL/hr over 30 Minutes Intravenous  Once 08/10/12 1446 08/10/12 1505          Assessment/Plan: Laryngeal cancer s/p gastric tube placement for nutritional needs during treatment.  Will loosen bumper for patient after RN administers pain medicine to relieve some tightness that may be contributing to her discomfort.  Tube is ready for all po needs.    Olyvia Gopal D 08/18/2012  Bumper to gastric tube manipulated to relieve pressure and redressed.  Patient tolerated well with some pain  during manipulation.  Will give another dose of dilaudid and trial of norco for today and tomorrow to assist with pain control at gastric tube site.  RN notified gastric tube can be used for feedings, meds and water effective immediately.

## 2012-08-18 NOTE — Progress Notes (Signed)
Nutrition Brief Note  Intervention: Jevity 1.2 via PEG start at 44ml/hr increase by 54ml q4hr to goal of 18ml/hr. This will provide 1584 calories, 73g protein, and 1039ml free water. If IVF d/c recommend 161ml water flushes q6hr. Will monitor for TF tolerance and advancement.   - SLP continues to follow pt, noted pt to have moderate oropharyngeal dysphagia 9/3. Pt had PEG placed yesterday. Discussed TF with MD who verbally authorized RD to initiate/manage TF which will start at noon today.   Mikey College MS, [place], [place] Pager [phone] After Hours Pager

## 2012-08-19 MED ORDER — OSMOLITE 1.2 CAL PO LIQD
1000.0000 mL | ORAL | Status: DC
  Administered 2012-08-19 – 2012-08-21 (×2): 1000 mL

## 2012-08-19 NOTE — Progress Notes (Signed)
Nutrition Brief Note  Intervention: Change to Osmolite 1.2 at 28ml/hr and increase by 87ml q4hr to goal of 69ml/hr. This will provide 1584 calories, 73g protein, and 1064ml free water. If IVF d/c recommend 133ml water flushes q6hr. Will monitor for TF tolerance and advancement.   Diet: NPO  TF: Jevity 1.2 at 76ml/hr- provides 1152 calories, 53g protein meeting 74% estimated calorie needs and 88% estimated protein needs  - RN reported pt with minimum TF residuals early this morning. RN states pt was not able to advance past 66ml/hr last night r/t feeling full and the rate was decreased down to 49ml/hr. Met with pt and family who report pt tolerating current rate without any nausea and pt c/o minimal stomach cramping. Last BM on 9/2. Family reports they think pt appears stronger since starting TF.   Mikey College MS, [place], [place] Pager [phone] After Hours Pager

## 2012-08-19 NOTE — Progress Notes (Signed)
[place] Work  Clinical Social Work consulted for additional support and assessment of psychosocial needs.  Pt was sleeping at time of CSW contact; therefore met with pt's daughter in-law who was at bedside.  CSW informed pt's daughter in-law of out-patient support, and support services at [facility].  Pt's daughter in-law reported increased emotional stress, anxiety, and denial since pt was diagnosed.  She did report some improvement, but feels pt and family would benefit from additional support.  CSW offered additional support, and encourged pt and family to call with any additional questions or concerns.  CSW will continue to follow and support as needed in the out-patient setting at [facility].  Johnnye Lana, MSW, [place] Worker [facility] [phone]

## 2012-08-19 NOTE — Progress Notes (Signed)
   ENT Progress Note: POD #9 s/p Procedure(s): TRACHEOSTOMY LARYNGOSCOPY   Subjective: Pt stable Airway intact  Objective: Vital signs in last 24 hours: Temp:  [98.4 F (36.9 C)-98.9 F (37.2 C)] 98.4 F (36.9 C) (09/06 1420) Pulse Rate:  [74-102] 84  (09/06 1420) Resp:  [18-19] 18  (09/06 1420) BP: (133-168)/(50-67) 140/52 mmHg (09/06 1420) SpO2:  [95 %-98 %] 97 % (09/06 1420) FiO2 (%):  [28 %] 28 % (09/06 1420) Weight change:  Last BM Date: 08/15/12  Intake/Output from previous day: 09/05 0701 - 09/06 0700 In: 1682.5 [P.O.:120; I.V.:1562.5] Out: 1050 [Urine:1050] Intake/Output this shift:    Labs:  [facility] 08/18/12 0325  WBC 8.1  HGB 9.5*  HCT 28.8*  PLT 290    Basename 08/18/12 0325  NA 139  K 3.4*  CL 101  CO2 31  GLUCOSE 104*  BUN 10  CALCIUM 9.0    Studies/Results: MRI Neck: Reviewed, tumor advanced to Rt paraglottic space. Stage IV  PHYSICAL EXAM: Trach inplace Stable   Assessment/Plan: With advanced tumor, stage IV, low chance of cure with chemorad tx. Pt scheduled for surgical treatment, Total Laryngectomy and Bil Neck Dissections, will admit to [facility] as outpt adm on 9/18. Risks and benefits discussed in detail with pt and family. Agree with plan. Pt may be d/c'ed when home care ready. Trach and airway are stable. Scheduled for outpt PET scan.    [place], [place] 08/19/2012, 7:07 PM

## 2012-08-19 NOTE — Progress Notes (Signed)
[facility] PROGRESS NOTE  Octavie Ariola [id number] DOB: [birth year] DOA: 08/08/2012 PCP: [name], MD  Brief narrative: 66 year old pleasant female admitted on 08/08/12 with left sided chest pain, dyspnea, non productive cough and weight loss. In the ED a CT angio of the chest showed necrotic laryngeal mass likely SCC compressing the airway and metastatic adenopathy. Patient is now status post diagnostic laryngoscopy, biopsy, and tracheostomy tube placement, and carries a biopsy proven diagnosis of right supraglottic SCC. Patient is now status post a tube insertion. The plan is for laryngectomy after PET scan on an outpatient basis.  Assessment/Plan:   Principal Problem:  *Laryngeal mass secondary to [facility] of supraglottic area  Per Dr. [name] (ENT) plan for laryngectomy and radiation once PET scan completed outpatient MRI neck attempted 08/17/2012, only 2 images taken which was suggestive of stage 4 laryngeal squamous cell carcinoma  Status post laryngoscopy and tracheostomy  Continue supplemental oxygen via trach collar.  Dr. Humphrey Rolls will assume oncology care of patient.   Active Problems:  Severe malnutrition secondary to acute illness  Status post PEG tube placement Normocytic anemia  Likely secondary to anemia of chronic disease secondary to malignancy  Hemoglobin stable COPD  Continue supplemental oxygen  Bronchodilator as needed. Hypertension  Continue lopressor IV PRN.  Added hydralazine IV PRN Multiple closed fractures of ribs of left side  Secondary to fall Continue current pain regimen with Dilaudid 1 mg every 2 hours as needed Tobacco abuse  Counseled on smoking cessation.  Continue nicotine patch. Hiatal hernia  Continue Pepcid Acute kidney injury  Likely due to dehydration Resolved Hypokalemia   Resolved  Code Status: Full code  Family Communication: updated family at bedside  Disposition Plan: Home when stable   Medical Consultants:  Dr.  [name], ENT.  Dr. Arloa Koh, Radiation Oncology  Dr. Sherryl Manges, Oncology (Care to be assumed by Dr. Marcy Panning 08/16/12). Other Consultants:  Dietician: Ensure, magic cup ordered.  Speech therapy: Not ready for PMSV yet. MBS recommended.  Occupational therapy: No DME needed.  Physical therapy: No further PT needed. Procedures:  Direct laryngoscopy with biopsy and tracheostomy tube placement done by Dr. [name] 08/10/12. Antibiotics:  None  Leisa Lenz, MD  [facility] Pager [phone]  If 7PM-7AM, please contact night-coverage [email or web address] Password [email or web address] 08/19/2012, 2:26 PM   LOS: 11 days   HPI/Subjective: No acute events overnight.  Objective: Filed Vitals:   08/19/12 0030 08/19/12 0543 08/19/12 0842 08/19/12 1207  BP: 139/50 133/63    Pulse:  102    Temp:  98.9 F (37.2 C)    TempSrc:  Oral    Resp:  19    Height:      Weight:      SpO2:  95% 96% 98%    Intake/Output Summary (Last 24 hours) at 08/19/12 1426 Last data filed at 08/19/12 1321  Gross per 24 hour  Intake 1232.5 ml  Output   1600 ml  Net -367.5 ml    Exam:   General:  Pt is alert, follows commands appropriately, not in acute distress  Cardiovascular: Regular rate and rhythm, S1/S2, no murmurs, no rubs, no gallops  Respiratory: Trach in place, Clear to auscultation bilaterally  Abdomen: Soft, non tender, non distended, bowel sounds present, no guarding  Extremities: No edema, pulses DP and PT palpable bilaterally  Neuro: Grossly nonfocal  Data Reviewed: Basic Metabolic Panel:  Lab Q000111Q 0325 08/13/12 0350  NA 139 141  K 3.4* 3.7  CL 101 106  CO2 31 29  GLUCOSE 104* 114*  BUN 10 5*  CREATININE 1.09 0.85  CALCIUM 9.0 8.8   CBC:  Lab 08/18/12 0325 08/13/12 0350  WBC 8.1 8.4  HGB 9.5* 9.6*  HCT 28.8* 28.6*  MCV 83.5 83.9  PLT 290 240     Studies: Ir Gastrostomy Tube Mod Sed 08/17/2012  * IMPRESSION:  Successful placement of a 20 French pull through  gastrostomy tube.  The tube will be ready for use 5,000,000 tomorrow (08/18/2012).       Mr Neck Soft Tissue Only Wo Contrast 08/17/2012  *  IMPRESSION: 1.  The examination had to be discontinued prior to completion due to patient request. 2.  MRI evidence of right paraglottic soft tissue involvement by the supraglottic mass, favor T4 tumor stage.      Scheduled Meds:   . amitriptyline  10 mg Oral QHS  . antiseptic oral rinse  15 mL Mouth Rinse q12n4p  . chlorhexidine  15 mL Mouth Rinse BID  . enoxaparin (LOVENOX) injection  40 mg Subcutaneous Q24H  . free water  30-60 mL Per Tube Q6H  . mirtazapine  15 mg Oral QHS  . neomycin-bacitracin-polymyxin   Topical Daily  . nicotine  21 mg Transdermal Daily  . Olmesartan-Amlodipine-HCTZ  0.5 tablet Oral Daily  . potassium chloride  10 mEq Intravenous Q1 Hr x 3  . ranitidine  150 mg Oral Daily  . DISCONTD: feeding supplement  1 Container Oral TID BM   Continuous Infusions:   . dextrose 5 % and 0.9% NaCl 50 mL/hr at 08/19/12 0419  . feeding supplement (OSMOLITE 1.2 CAL)    . DISCONTD: feeding supplement (JEVITY 1.2 CAL) 1,000 mL (08/19/12 0424)

## 2012-08-20 MED ORDER — FENTANYL 12 MCG/HR TD PT72
12.5000 ug | MEDICATED_PATCH | TRANSDERMAL | Status: DC
  Administered 2012-08-20: 12.5 ug via TRANSDERMAL
  Filled 2012-08-20: qty 1

## 2012-08-20 MED ORDER — LORAZEPAM 1 MG PO TABS
1.0000 mg | ORAL_TABLET | Freq: Four times a day (QID) | ORAL | Status: DC | PRN
  Administered 2012-08-20 – 2012-08-22 (×3): 1 mg via ORAL
  Filled 2012-08-20 (×4): qty 1

## 2012-08-20 NOTE — Progress Notes (Signed)
[facility] PROGRESS NOTE  [name] [id number] DOB: 1946-05-23 DOA: 08/08/2012 PCP: [name], MD  Brief narrative: 66 year old pleasant female admitted on 08/08/12 with left sided chest pain, dyspnea, non productive cough and weight loss. In the ED a CT angio of the chest showed necrotic laryngeal mass likely SCC compressing the airway and metastatic adenopathy. Patient is now status post diagnostic laryngoscopy, biopsy, and tracheostomy tube placement, and carries a biopsy proven diagnosis of right supraglottic SCC. Patient is now status post a tube insertion. The plan is for laryngectomy after PET scan on an outpatient basis.   Assessment/Plan:   Principal Problem:  *Laryngeal mass secondary to [facility] of supraglottic area  Per Dr. [name] (ENT) plan for laryngectomy and radiation once PET scan completed outpatient  MRI neck attempted 08/17/2012, only 2 images taken which was suggestive of stage 4 laryngeal squamous cell carcinoma  Patient is status post laryngoscopy and tracheostomy  Continue supplemental oxygen via trach collar.  Dr. Humphrey Rolls now primary oncologist.   Active Problems:  Severe malnutrition secondary to acute illness  Status post PEG tube placement Tube feeds started and patient tolerates it well Normocytic anemia  Likely secondary to anemia of chronic disease secondary to malignancy  Hemoglobin stable COPD  Continue supplemental oxygen  Bronchodilator as needed. Hypertension  Will continue lopressor and hydralazine  IV PRN Multiple closed fractures of ribs of left side  Secondary to fall  Continue current pain regimen with Dilaudid 1 mg every 2 hours as needed Tobacco abuse  Patient was counseled on smoking cessation.  Continue nicotine patch. Hiatal hernia  Continue Pepcid Acute kidney injury  Likely due to dehydration  Resolved Hypokalemia  Resolved  Follow up BMP in am  Code Status: Full code  Family Communication: updated family at  bedside  Disposition Plan: Home when stable   Medical Consultants:  Dr. [name], ENT.  Dr. Arloa Koh, Radiation Oncology  Dr. Sherryl Manges, Oncology (Care to be assumed by Dr. Marcy Panning 08/16/12). Other Consultants:  Dietician: Ensure, magic cup ordered.  Speech therapy: Not ready for PMSV yet. MBS recommended.  Occupational therapy: No DME needed.  Physical therapy: No further PT needed. Procedures:  Direct laryngoscopy with biopsy and tracheostomy tube placement done by Dr. [name] 08/10/12. Antibiotics:  None  Leisa Lenz, MD  [facility] Pager [phone]  If 7PM-7AM, please contact night-coverage [email or web address] Password [email or web address] 08/20/2012, 9:27 AM   LOS: 12 days   HPI/Subjective: No acute events overnight.  Objective: Filed Vitals:   08/20/12 0329 08/20/12 0359 08/20/12 0615 08/20/12 0810  BP:   158/74   Pulse: 77 73 85   Temp:   98.2 F (36.8 C)   TempSrc:   Oral   Resp: 17 16 18    Height:      Weight:      SpO2: 94% 93% 97% 97%    Intake/Output Summary (Last 24 hours) at 08/20/12 0927 Last data filed at 08/20/12 0616  Gross per 24 hour  Intake    771 ml  Output   2000 ml  Net  -1229 ml    Exam:   General:  Pt is alert, follows commands appropriately, not in acute distress  Cardiovascular: Regular rate and rhythm, S1/S2, no murmurs, no rubs, no gallops  Respiratory: (+) trach; Clear to auscultation bilaterally, no wheezing, no crackles, no rhonchi  Abdomen: Soft, non tender, non distended, bowel sounds present, no guarding  Extremities: No edema, pulses DP and PT palpable bilaterally  Neuro:  Grossly nonfocal  Data Reviewed: Basic Metabolic Panel:  Lab [id number] 0325 08/15/12 0336  NA 139 --  K 3.4* --  CL 101 --  CO2 31 --  GLUCOSE 104* --  BUN 10 --  CREATININE 1.09 0.87  CALCIUM 9.0 --  MG -- --  PHOS -- --   CBC:  Lab 08/18/12 0325  WBC 8.1  NEUTROABS --  HGB 9.5*  HCT 28.8*  MCV 83.5  PLT 290     Studies: No results found.  Scheduled Meds:   . amitriptyline  10 mg Oral QHS  . antiseptic oral rinse  15 mL Mouth Rinse q12n4p  . chlorhexidine  15 mL Mouth Rinse BID  . enoxaparin (LOVENOX) injection  40 mg Subcutaneous Q24H  . fentaNYL  12.5 mcg Transdermal Q72H  . free water  30-60 mL Per Tube Q6H  . mirtazapine  15 mg Oral QHS  . neomycin-bacitracin-polymyxin   Topical Daily  . nicotine  21 mg Transdermal Daily  . Olmesartan-Amlodipine-HCTZ  0.5 tablet Oral Daily  . ranitidine  150 mg Oral Daily  . DISCONTD: feeding supplement  1 Container Oral TID BM   Continuous Infusions:   . dextrose 5 % and 0.9% NaCl 50 mL/hr at 08/19/12 0419  . feeding supplement (OSMOLITE 1.2 CAL) 1,000 mL (08/19/12 1758)  . DISCONTD: feeding supplement (JEVITY 1.2 CAL) 1,000 mL (08/19/12 0424)

## 2012-08-21 LAB — BASIC METABOLIC PANEL
CO2: 29 mEq/L (ref 19–32)
Calcium: 9 mg/dL (ref 8.4–10.5)
Glucose, Bld: 126 mg/dL — ABNORMAL HIGH (ref 70–99)
Potassium: 3.8 mEq/L (ref 3.5–5.1)
Sodium: 138 mEq/L (ref 135–145)

## 2012-08-21 LAB — CBC
Hemoglobin: 9.2 g/dL — ABNORMAL LOW (ref 12.0–15.0)
MCH: 27.5 pg (ref 26.0–34.0)
MCV: 86.2 fL (ref 78.0–100.0)
Platelets: 326 10*3/uL (ref 150–400)
RBC: 3.34 MIL/uL — ABNORMAL LOW (ref 3.87–5.11)
WBC: 7.6 10*3/uL (ref 4.0–10.5)

## 2012-08-21 NOTE — Progress Notes (Signed)
Nutrition Follow-up  Intervention:   Continue with current TF regimen as tolerated.  Assessment:   TF advanced to goal rate this AM. TF (Osmolitte 1.2) currently at 55 mL/hr via PEG tube, which provides 1584 calories, 73 gm protein, and 1082 ml free water daily.   Diet Order:  Dysphagia 3, thin liquids (0% PO intake documented)  Meds: Scheduled Meds:   . amitriptyline  10 mg Oral QHS  . antiseptic oral rinse  15 mL Mouth Rinse q12n4p  . chlorhexidine  15 mL Mouth Rinse BID  . enoxaparin (LOVENOX) injection  40 mg Subcutaneous Q24H  . fentaNYL  12.5 mcg Transdermal Q72H  . free water  30-60 mL Per Tube Q6H  . mirtazapine  15 mg Oral QHS  . neomycin-bacitracin-polymyxin   Topical Daily  . nicotine  21 mg Transdermal Daily  . Olmesartan-Amlodipine-HCTZ  0.5 tablet Oral Daily  . ranitidine  150 mg Oral Daily   Continuous Infusions:   . dextrose 5 % and 0.9% NaCl 50 mL/hr at 08/20/12 1852  . feeding supplement (OSMOLITE 1.2 CAL) 1,000 mL (08/19/12 1758)   PRN Meds:.acetaminophen, acetaminophen, albuterol, feeding supplement, hydrALAZINE, HYDROmorphone (DILAUDID) injection, ipratropium, lidocaine, LORazepam, metoprolol, ondansetron (ZOFRAN) IV, ondansetron, promethazine  Labs:  CMP     Component Value Date/Time   NA 138 08/21/2012 0407   K 3.8 08/21/2012 0407   CL 102 08/21/2012 0407   CO2 29 08/21/2012 0407   GLUCOSE 126* 08/21/2012 0407   BUN 11 08/21/2012 0407   CREATININE 0.84 08/21/2012 0407   CALCIUM 9.0 08/21/2012 0407   PROT 6.1 08/10/2012 0303   ALBUMIN 3.2* 08/10/2012 0303   AST 16 08/10/2012 0303   ALT 9 08/10/2012 0303   ALKPHOS 82 08/10/2012 0303   BILITOT 0.3 08/10/2012 0303   GFRNONAA 71* 08/21/2012 0407   GFRAA 82* 08/21/2012 0407  Gastric residuals: 20 mL Last BM: 08/15/12  Intake/Output Summary (Last 24 hours) at 08/21/12 1032 Last data filed at 08/21/12 0550  Gross per 24 hour  Intake 2769.67 ml  Output   2950 ml  Net -180.33 ml    Weight Status:  No new  weight.  Re-estimated needs:   1550-1750 kcal 60-75 gm protein  Nutrition Dx:  Predicted suboptimal energy intake - ongoing  Goal:  Pt to consume >50% of meals/supplements- not met  New Goal: To provide >=90% estimated nutritional needs via enteral nutrition.   Monitor:  TF tolerance/adequacy, weight, labs, PO intake   Barbaraann Avans, Bary Richard RD, LDN

## 2012-08-21 NOTE — Progress Notes (Addendum)
Patient ID: [name], female   DOB: 1946/06/30, 66 y.o.   MRN: [id number] [facility] PROGRESS NOTE  [name] [id number] DOB: 01/28/1946 DOA: 08/08/2012 PCP: [name], MD  Brief narrative: 66 year old pleasant female admitted on 08/08/12 with left sided chest pain, dyspnea, non productive cough and weight loss. In the ED a CT angio of the chest showed necrotic laryngeal mass likely SCC compressing the airway and metastatic adenopathy. Patient is now status post diagnostic laryngoscopy, biopsy, and tracheostomy tube placement, and carries a biopsy proven diagnosis of right supraglottic SCC. Patient is now status post a tube insertion. The plan is for laryngectomy after PET scan on an outpatient basis.   Assessment/Plan:   Principal Problem:  *Laryngeal mass secondary to [facility] of supraglottic area  plan for laryngectomy and radiation once PET scan completed outpatient  MRI neck attempted 08/17/2012, only 2 images taken which was suggestive of stage 4 laryngeal squamous cell carcinoma  Patient is status post laryngoscopy and tracheostomy  Continue supplemental oxygen via trach collar.  Dr. Humphrey Rolls now primary oncologist.   Active Problems:  Severe malnutrition secondary to acute illness  Status post PEG tube placement  Continue tube feeds Normocytic anemia  Likely secondary to anemia of chronic disease secondary to malignancy  Hemoglobin stable COPD  Continue supplemental oxygen  Bronchodilator as needed. Hypertension  Continue opressor and hydralazine IV PRN Multiple closed fractures of ribs of left side  Secondary to fall  Continue current pain regimen with Dilaudid 1 mg every 2 hours as needed Tobacco abuse  Patient was counseled on smoking cessation.  Continue nicotine patch. Hiatal hernia  Continue Pepcid Acute kidney injury  Secondary to dehydration resolved Hypokalemia  Resolved   Code Status: Full code  Family Communication: updated family at  bedside  Disposition Plan: Home when stable   Medical Consultants:  Dr. [name], ENT.  Dr. Arloa Koh, Radiation Oncology  Dr. Sherryl Manges, Oncology (Care to be assumed by Dr. Marcy Panning 08/16/12). Other Consultants:  Dietician: Ensure, magic cup ordered.  Speech therapy: Not ready for PMSV yet. MBS recommended.  Occupational therapy: No DME needed.  Physical therapy: No further PT needed. Procedures:  Direct laryngoscopy with biopsy and tracheostomy tube placement done by Dr. [name] 08/10/12. Antibiotics:  None  Leisa Lenz, MD  [facility] Pager [phone]  If 7PM-7AM, please contact night-coverage [email or web address] Password [facility] 08/21/2012, 9:41 AM   LOS: 13 days   HPI/Subjective: No acute events overnight.  Objective: Filed Vitals:   08/21/12 0010 08/21/12 0302 08/21/12 0530 08/21/12 0724  BP:   149/60   Pulse: 80 74 82 73  Temp:   99.1 F (37.3 C)   TempSrc:   Oral   Resp: 16 18 18 16   Height:      Weight:      SpO2:  94% 97% 96%    Intake/Output Summary (Last 24 hours) at 08/21/12 0941 Last data filed at 08/21/12 0550  Gross per 24 hour  Intake 2769.67 ml  Output   2950 ml  Net -180.33 ml    Exam:   General:  Pt is alert, follows commands appropriately, not in acute distress  Cardiovascular: Regular rate and rhythm, S1/S2, no murmurs, no rubs, no gallops  Respiratory: (+) trach, Clear to auscultation bilaterally, no wheezing, no crackles, no rhonchi  Abdomen: Soft, non tender, non distended, bowel sounds present, no guarding  Extremities: No edema, pulses DP and PT palpable bilaterally  Neuro: Grossly nonfocal  Data Reviewed: Basic Metabolic Panel:  Lab 08/21/12 0407 08/18/12 0325 08/15/12 0336  NA 138 139 --  K 3.8 3.4* --  CL 102 101 --  CO2 29 31 --  GLUCOSE 126* 104* --  BUN 11 10 --  CREATININE 0.84 1.09 0.87  CALCIUM 9.0 9.0 --  MG -- -- --  PHOS -- -- --   CBC:  Lab 08/21/12 0407 08/18/12 0325  WBC 7.6 8.1  NEUTROABS -- --  HGB  9.2* 9.5*  HCT 28.8* 28.8*  MCV 86.2 83.5  PLT 326 290    Studies: No results found.  Scheduled Meds:   . amitriptyline  10 mg Oral QHS  . antiseptic oral rinse  15 mL Mouth Rinse q12n4p  . chlorhexidine  15 mL Mouth Rinse BID  . enoxaparin (LOVENOX) injection  40 mg Subcutaneous Q24H  . fentaNYL  12.5 mcg Transdermal Q72H  . free water  30-60 mL Per Tube Q6H  . mirtazapine  15 mg Oral QHS  . neomycin-bacitracin-polymyxin   Topical Daily  . nicotine  21 mg Transdermal Daily  . Olmesartan-Amlodipine-HCTZ  0.5 tablet Oral Daily  . ranitidine  150 mg Oral Daily   Continuous Infusions:   . dextrose 5 % and 0.9% NaCl 50 mL/hr at 08/20/12 1852  . feeding supplement (OSMOLITE 1.2 CAL) 1,000 mL (08/19/12 1758)

## 2012-08-21 NOTE — Progress Notes (Signed)
Residuals 20 cc when checked. Tube feeding advanced to 55cc/hr goal rate.  Peg tube flushed effortlessly. Trach Inner cannula replaced per patient request. Replaced with minimal difficulty. Pt tolerated well. Faint expiratory rhonchi noted left upper lung. Pt denied need for suctioning.  Cleansed outside of trach site with hydrogen peroxide, drainage sponge applied.

## 2012-08-22 MED ORDER — FREE WATER: 30.0000 mL | Freq: Four times a day (QID) | Status: DC

## 2012-08-22 MED ORDER — LORAZEPAM 1 MG PO TABS: 1.0000 mg | ORAL_TABLET | Freq: Four times a day (QID) | ORAL | Status: DC | PRN

## 2012-08-22 MED ORDER — RANITIDINE HCL 150 MG/10ML PO SYRP: 150.0000 mg | ORAL_SOLUTION | Freq: Every day | ORAL | Status: DC

## 2012-08-22 MED ORDER — ENSURE COMPLETE PO LIQD: 237.0000 mL | ORAL | Status: DC | PRN

## 2012-08-22 MED ORDER — MIRTAZAPINE 15 MG PO TBDP: 15.0000 mg | ORAL_TABLET | Freq: Every day | ORAL | Status: DC

## 2012-08-22 MED ORDER — NICOTINE 21 MG/24HR TD PT24: 1.0000 | MEDICATED_PATCH | Freq: Every day | TRANSDERMAL | Status: DC

## 2012-08-22 MED ORDER — CHLORHEXIDINE GLUCONATE 0.12 % MT SOLN: 15.0000 mL | Freq: Two times a day (BID) | OROMUCOSAL | Status: AC

## 2012-08-22 MED ORDER — FENTANYL 12 MCG/HR TD PT72: 1.0000 | MEDICATED_PATCH | TRANSDERMAL | Status: DC

## 2012-08-22 MED ORDER — OSMOLITE 1.2 CAL PO LIQD: 1000.0000 mL | ORAL | Status: DC

## 2012-08-22 MED ORDER — ONDANSETRON HCL 4 MG PO TABS: 4.0000 mg | ORAL_TABLET | Freq: Four times a day (QID) | ORAL | Status: DC | PRN

## 2012-08-22 MED ORDER — HYDROCODONE-ACETAMINOPHEN 10-325 MG PO TABS: 1.0000 | ORAL_TABLET | Freq: Four times a day (QID) | ORAL | Status: DC | PRN

## 2012-08-22 MED ORDER — BIOTENE DRY MOUTH MT LIQD: 15.0000 mL | Freq: Two times a day (BID) | OROMUCOSAL | Status: DC

## 2012-08-22 MED ORDER — BACITRACIN-NEOMYCIN-POLYMYXIN OINTMENT TUBE: 1.0000 "application " | TOPICAL_OINTMENT | Freq: Every day | CUTANEOUS | Status: DC

## 2012-08-22 MED ORDER — HYDROMORPHONE HCL 8 MG PO TABS: 8.0000 mg | ORAL_TABLET | ORAL | Status: DC | PRN

## 2012-08-22 NOTE — Progress Notes (Addendum)
Nutrition Brief Note  - Noted plans to d/c today. Pt tolerating Osmolite 1.2 at goal rate of 1ml/hr. Met with pt and daughter-in-law to review nutrition concerns and questions were answered. If pt desires to switch to bolus, recommend 6 cans of Osmolite 1.2 daily (sample plan would be 2 cans TID at mealtimes). This will provide 1710 calories, 79g protein, 1158ml free water. Pt would need to flush with 53ml of water before and after each 2 can bolus and flush with an additional 249ml (1 cup of water) daily to meet all of pt's fluid needs from TF and water flushes.   Mikey College MS, [place], [place] Pager [phone] After Hours Pager

## 2012-08-22 NOTE — Progress Notes (Signed)
Patient O2 sats were 97-100% on RA after O2 was taken off for about 10 min.  She does not presently qualify for O2 at this time based on Medicaid guidelines.  Patients family wishes to hold off on the O2 set up.  Patient will d/c today with Trach and TF needs through [facility].

## 2012-08-22 NOTE — Discharge Summary (Signed)
Physician Discharge Summary  [name] [id number] DOB: 1946/06/08 DOA: 08/08/2012  PCP: [name], MD  Admit date: 08/08/2012 Discharge date: 08/22/2012  Recommendations for Outpatient Follow-up:  1. Please note that the PET scan will be scheduled per radiation oncology once the patient is discharged  Discharge Diagnoses:  Principal Problem:  *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 Active Problems:  Hypertension  Anxiety  Multiple closed fractures of ribs of left side  Tobacco abuse  Hiatal hernia  AKI (acute kidney injury)  COPD (chronic obstructive pulmonary disease)  Normocytic anemia  Severe malnutrition secondary to acute illness   Discharge Condition: Medically stable to be discharged home with home health PT and RN; please note that all the orders in regards to the trach suction and PEG tube feeds are ordered under home health  Diet recommendation: PEG tube feeds and has tolerated by mouth  History of present illness:  66 year old pleasant female admitted on 08/08/12 with left sided chest pain, dyspnea, non productive cough and weight loss. In the ED a CT angio of the chest showed necrotic laryngeal mass likely SCC compressing the airway and metastatic adenopathy. Patient is now status post diagnostic laryngoscopy, biopsy, and tracheostomy tube placement, and carries a biopsy proven diagnosis of right supraglottic SCC. Patient is now status post a tube insertion. The plan is for laryngectomy after PET scan on an outpatient basis.   Assessment/Plan:   Principal Problem:  *Laryngeal mass secondary to [facility] of supraglottic area  plan for laryngectomy and radiation once PET scan completed outpatient  MRI neck attempted 08/17/2012, only 2 images taken which was suggestive of stage 4 laryngeal squamous cell carcinoma  Patient is status post laryngoscopy and tracheostomy  Continue supplemental oxygen via trach collar.  Dr. Humphrey Rolls now primary oncologist.  PET  scan to be scheduled once patient is discharged per radiation oncology  Active Problems:  Severe malnutrition secondary to acute illness  Status post PEG tube placement  Continue tube feeds, 55 cc an hour, at goal Normocytic anemia  Likely secondary to anemia of chronic disease secondary to malignancy  Hemoglobin stable COPD  Continue supplemental oxygen at home Hypertension  At goal Multiple closed fractures of ribs of left side  Secondary to fall  Continue current pain regimen with Dilaudid 1 mg every 2 hours as needed Tobacco abuse  Patient was counseled on smoking cessation.  Continue nicotine patch. Hiatal hernia  Continue Zantac Acute kidney injury  Secondary to dehydration  resolved Hypokalemia  Resolved   Code Status: Full code  Family Communication: updated family at bedside  Disposition Plan: Home today   Medical Consultants:  Dr. [name], ENT.  Dr. Arloa Koh, Radiation Oncology  Dr. Sherryl Manges, Oncology (Care to be assumed by Dr. Marcy Panning 08/16/12). Other Consultants:  Dietician: Ensure, magic cup ordered.  Speech therapy: Not ready for PMSV yet. MBS recommended.  Occupational therapy: No DME needed.  Physical therapy: No further PT needed. Procedures:  Direct laryngoscopy with biopsy and tracheostomy tube placement done by Dr. [name] 08/10/12. Antibiotics:  None  Leisa Lenz, MD  [facility]  Pager [phone]  Discharge Exam: Filed Vitals:   08/22/12 0520  BP: 149/60  Pulse: 81  Temp: 98.5 F (36.9 C)  Resp: 17   Filed Vitals:   08/21/12 2015 08/22/12 0000 08/22/12 0340 08/22/12 0520  BP: 152/67   149/60  Pulse: 91 77 82 81  Temp: 100 F (37.8 C)   98.5 F (36.9 C)  TempSrc: Oral  Oral  Resp: 18 20 18 17   Height:      Weight:      SpO2: 95% 97% 98% 95%    General: Pt is alert, follows commands appropriately, not in acute distress Cardiovascular: Regular rate and rhythm, S1/S2 +, no murmurs, no rubs, no gallops Respiratory: Trach in  place; mild rhonchi appreciated over left upper lobe but no wheezing Abdominal: Soft, non tender, non distended, PEG tube in place, bowel sounds +, no guarding Extremities: no edema, no cyanosis, pulses palpable bilaterally DP and PT Neuro: Grossly nonfocal  Discharge Instructions   Medication List  As of 08/22/2012 11:16 AM   TAKE these medications         amitriptyline 10 MG tablet   Commonly known as: ELAVIL   Take 10 mg by mouth at bedtime.      antiseptic oral rinse Liqd   15 mLs by Mouth Rinse route 2 times daily at 12 noon and 4 pm.      atenolol 50 MG tablet   Commonly known as: TENORMIN   Take 25 mg by mouth 2 (two) times daily. Take 1/2 tablet 25 mg total twice a day      chlorhexidine 0.12 % solution   Commonly known as: PERIDEX   Use as directed 15 mLs in the mouth or throat 2 (two) times daily.      clopidogrel 75 MG tablet   Commonly known as: PLAVIX   Take 75 mg by mouth daily.      feeding supplement Liqd   Take 237 mLs by mouth as needed (For additional nutrition ).      feeding supplement (OSMOLITE 1.2 CAL) Liqd   Place 1,000 mLs into feeding tube continuous.      fentaNYL 12 MCG/HR   Commonly known as: DURAGESIC - dosed mcg/hr   Place 1 patch (12.5 mcg total) onto the skin every 3 (three) days.      free water Soln   Place 30-60 mLs into feeding tube every 6 (six) hours.      HYDROcodone-acetaminophen 10-325 MG per tablet   Commonly known as: NORCO   Take 1 tablet by mouth every 6 (six) hours as needed for pain (for breakthrough pain).      HYDROmorphone 8 MG tablet   Commonly known as: DILAUDID   Take 1 tablet (8 mg total) by mouth every 4 (four) hours as needed for pain.      LORazepam 1 MG tablet   Commonly known as: ATIVAN   Take 1 tablet (1 mg total) by mouth every 6 (six) hours as needed for anxiety.      mirtazapine 15 MG disintegrating tablet   Commonly known as: REMERON SOL-TAB   Take 1 tablet (15 mg total) by mouth at bedtime.       neomycin-bacitracin-polymyxin Oint   Commonly known as: NEOSPORIN   Apply 1 application topically daily.      nicotine 21 mg/24hr patch   Commonly known as: NICODERM CQ - dosed in mg/24 hours   Place 1 patch onto the skin daily.      Olmesartan-Amlodipine-HCTZ 40-5-25 MG Tabs   Take 0.5 tablets by mouth daily.      ondansetron 4 MG tablet   Commonly known as: ZOFRAN   Take 1 tablet (4 mg total) by mouth every 6 (six) hours as needed for nausea.      pravastatin 40 MG tablet   Commonly known as: PRAVACHOL   Take 40 mg by  mouth daily.      ranitidine 150 MG/10ML syrup   Commonly known as: ZANTAC   Take 10 mLs (150 mg total) by mouth daily.              The results of significant diagnostics from this hospitalization (including imaging, microbiology, ancillary and laboratory) are listed below for reference.    Significant Diagnostic Studies: Ct Soft Tissue Neck W Contrast 08/08/2012  *  IMPRESSION:  1.  3.8 x 3.2 x 2.6 cm laryngeal mass above the level of the true cords, involving the aryepiglottic folds, epiglottis, piriform sinuses and vallecula.  This is necrotic on the left and is causing marked airway narrowing.  This is most compatible with a primary squamous cell carcinoma. 2.  Necrotic metastatic adenopathy on the right, compatible with metastatic squamous cell carcinoma. 3.  COPD.     Ct Angio Chest Pe W/cm &/or Wo Cm 08/08/2012  * IMPRESSION:  1.  There appears to be a soft tissue mass in the larynx just above the true cords compressing the airway. 2.  Fractures of the lateral aspects of the left fifth, sixth, and seventh ribs. There may be a fracture of the anterior lateral aspect of the left eighth rib as well. 3.  No pulmonary emboli.    Ir Gastrostomy Tube Mod Sed 08/17/2012  *  IMPRESSION:  Successful placement of a 20 French pull through gastrostomy tube.  The tube will be ready for use 5,000,000 tomorrow (08/18/2012).  Signed,  Criselda Peaches, MD Vascular &  Interventional Radiologist [facility]    Mr Neck Soft Tissue Only Wo Contrast 08/17/2012  *  IMPRESSION: 1.  The examination had to be discontinued prior to completion due to patient request. 2.  MRI evidence of right paraglottic soft tissue involvement by the supraglottic mass, favor T4 tumor stage.     Dg Chest Port 1 View 08/10/2012  * IMPRESSION:  1.  Interval placement of a tracheostomy tube without radiographic evidence for complication. 2.  New left basilar airspace disease.  This likely reflects atelectasis.  Early infection or aspiration is not excluded.     Dg Swallowing Func-no Report 08/16/2012  CLINICAL DATA: Eval swallowing per ST recs.   FLUOROSCOPY FOR SWALLOWING FUNCTION STUDY:  Fluoroscopy was provided for swallowing function study, which was  administered by a speech pathologist.  Final results and recommendations  from this study are contained within the speech pathology report.      Labs: Basic Metabolic Panel:  Lab [id number] 0407 08/18/12 0325  NA 138 139  K 3.8 3.4*  CL 102 101  CO2 29 31  GLUCOSE 126* 104*  BUN 11 10  CREATININE 0.84 1.09  CALCIUM 9.0 9.0   CBC:  Lab 08/21/12 0407 08/18/12 0325  WBC 7.6 8.1  HGB 9.2* 9.5*  HCT 28.8* 28.8*  MCV 86.2 83.5  PLT 326 290    Time coordinating discharge: Over 30 minutes  Signed:  Leisa Lenz, MD  [facility] 08/22/2012, 11:16 AM  Pager #: [phone]

## 2012-08-22 NOTE — Progress Notes (Signed)
PET was scheduled for tomorrow but will be postponed until 72 hours after discharge from hospital. I can not reschedule until patient is discharged. Surgery is tentatively scheduled next week with readmission on Wednesday September 18 per Dr. [name].

## 2012-08-22 NOTE — Progress Notes (Signed)
Discharge instructions and scripts given to patient's caregiver, Janett Billow (daughter in law). Caregiver verbalized understanding and explained to patient. Transport service notified.

## 2012-08-23 ENCOUNTER — Other Ambulatory Visit: Payer: Self-pay | Admitting: Radiation Oncology

## 2012-08-23 ENCOUNTER — Other Ambulatory Visit (HOSPITAL_COMMUNITY): Payer: Self-pay

## 2012-08-23 DIAGNOSIS — C320 Malignant neoplasm of glottis: Secondary | ICD-10-CM

## 2012-08-24 ENCOUNTER — Encounter (HOSPITAL_COMMUNITY): Payer: Self-pay | Admitting: Pharmacy Technician

## 2012-08-26 ENCOUNTER — Other Ambulatory Visit: Payer: Self-pay | Admitting: Otolaryngology

## 2012-08-26 ENCOUNTER — Encounter (HOSPITAL_COMMUNITY)
Admission: RE | Admit: 2012-08-26 | Discharge: 2012-08-26 | Disposition: A | Discharge status: Home / Self Care | Payer: Medicaid Other | Source: Ambulatory Visit | Attending: Otolaryngology | Admitting: Otolaryngology

## 2012-08-26 ENCOUNTER — Encounter (HOSPITAL_COMMUNITY): Payer: Self-pay

## 2012-08-26 HISTORY — DX: Insomnia, unspecified: G47.00

## 2012-08-26 HISTORY — DX: Other chronic pain: G89.29

## 2012-08-26 HISTORY — DX: Nausea: R11.0

## 2012-08-26 HISTORY — DX: Constipation, unspecified: K59.00

## 2012-08-26 HISTORY — DX: Depression, unspecified: F32.A

## 2012-08-26 HISTORY — DX: Fracture of one rib, unspecified side, initial encounter for closed fracture: S22.39XA

## 2012-08-26 HISTORY — DX: Gastro-esophageal reflux disease without esophagitis: K21.9

## 2012-08-26 HISTORY — DX: Multiple fractures of ribs, unspecified side, initial encounter for closed fracture: S22.49XA

## 2012-08-26 HISTORY — DX: Major depressive disorder, single episode, unspecified: F32.9

## 2012-08-26 HISTORY — DX: Other chronic pain: M54.9

## 2012-08-26 HISTORY — DX: Headache: R51

## 2012-08-26 NOTE — Progress Notes (Signed)
Pt doesn't have a cardiologist  Dr.Kim is Medical MD  Denies ever having an echo/stress test/heart cath  No recent EKG or CXR

## 2012-08-26 NOTE — Pre-Procedure Instructions (Signed)
[place]  08/26/2012   Your procedure is scheduled on:  Wed, Sept 18 @ 9:30 AM  Report to [place] at 7:30 AM.  Call this number if you have problems the morning of surgery: [phone]   Remember:   Do not eat food:After Midnight.    Take these medicines the morning of surgery with A SIP OF WATER: Atenolol(Tenormin),Pain Pill(if needed),Ativan(Lorazepam),Zofran(Ondansetron-if needed),and Zantac(Ranitidine)   Do not wear jewelry, make-up or nail polish.  Do not wear lotions, powders, or perfumes. You may wear deodorant.  Do not shave 48 hours prior to surgery. Men may shave face and neck.  Do not bring valuables to the hospital.  Contacts, dentures or bridgework may not be worn into surgery.  Leave suitcase in the car. After surgery it may be brought to your room.  For patients admitted to the hospital, checkout time is 11:00 AM the day of discharge.   Patients discharged the day of surgery will not be allowed to drive home.  Special Instructions: CHG Shower Use Special Wash: 1/2 bottle night before surgery and 1/2 bottle morning of surgery.   Please read over the following fact sheets that you were given: Pain Booklet, Coughing and Deep Breathing, MRSA Information and Surgical Site Infection Prevention

## 2012-08-26 NOTE — Progress Notes (Signed)
Orders requested from Dr.Shoemakers office

## 2012-08-30 ENCOUNTER — Encounter (HOSPITAL_COMMUNITY)
Admission: RE | Admit: 2012-08-30 | Discharge: 2012-08-30 | Disposition: A | Discharge status: Home / Self Care | Payer: Medicaid Other | Source: Ambulatory Visit | Attending: Radiation Oncology | Admitting: Radiation Oncology

## 2012-08-30 DIAGNOSIS — Z930 Tracheostomy status: Secondary | ICD-10-CM | POA: Insufficient documentation

## 2012-08-30 DIAGNOSIS — X58XXXA Exposure to other specified factors, initial encounter: Secondary | ICD-10-CM | POA: Insufficient documentation

## 2012-08-30 DIAGNOSIS — C320 Malignant neoplasm of glottis: Secondary | ICD-10-CM | POA: Insufficient documentation

## 2012-08-30 DIAGNOSIS — C770 Secondary and unspecified malignant neoplasm of lymph nodes of head, face and neck: Secondary | ICD-10-CM | POA: Insufficient documentation

## 2012-08-30 DIAGNOSIS — S2249XA Multiple fractures of ribs, unspecified side, initial encounter for closed fracture: Secondary | ICD-10-CM | POA: Insufficient documentation

## 2012-08-30 DIAGNOSIS — M898X9 Other specified disorders of bone, unspecified site: Secondary | ICD-10-CM | POA: Insufficient documentation

## 2012-08-30 DIAGNOSIS — J9819 Other pulmonary collapse: Secondary | ICD-10-CM | POA: Insufficient documentation

## 2012-08-30 LAB — GLUCOSE, CAPILLARY: Glucose-Capillary: 106 mg/dL — ABNORMAL HIGH (ref 70–99)

## 2012-08-30 MED ORDER — FLUDEOXYGLUCOSE F - 18 (FDG) INJECTION
19.1000 | Freq: Once | INTRAVENOUS | Status: AC | PRN
  Administered 2012-08-30: 19.1 via INTRAVENOUS

## 2012-08-31 ENCOUNTER — Inpatient Hospital Stay (HOSPITAL_COMMUNITY): Payer: Medicaid Other

## 2012-08-31 ENCOUNTER — Encounter (HOSPITAL_COMMUNITY)
Admission: RE | Disposition: A | Discharge status: Home / Self Care | Payer: Self-pay | Source: Ambulatory Visit | Attending: Otolaryngology

## 2012-08-31 ENCOUNTER — Inpatient Hospital Stay (HOSPITAL_COMMUNITY)
Admission: RE | Admit: 2012-08-31 | Discharge: 2012-09-14 | DRG: 012 | Disposition: A | Discharge status: Home / Self Care | Payer: Medicaid Other | Source: Ambulatory Visit | Attending: Otolaryngology | Admitting: Otolaryngology

## 2012-08-31 ENCOUNTER — Encounter (HOSPITAL_COMMUNITY): Payer: Self-pay | Admitting: *Deleted

## 2012-08-31 ENCOUNTER — Inpatient Hospital Stay (HOSPITAL_COMMUNITY): Payer: Medicaid Other | Admitting: Anesthesiology

## 2012-08-31 ENCOUNTER — Encounter (HOSPITAL_COMMUNITY): Payer: Self-pay | Admitting: Anesthesiology

## 2012-08-31 DIAGNOSIS — R64 Cachexia: Secondary | ICD-10-CM | POA: Diagnosis present

## 2012-08-31 DIAGNOSIS — K219 Gastro-esophageal reflux disease without esophagitis: Secondary | ICD-10-CM | POA: Diagnosis present

## 2012-08-31 DIAGNOSIS — K449 Diaphragmatic hernia without obstruction or gangrene: Secondary | ICD-10-CM

## 2012-08-31 DIAGNOSIS — Z720 Tobacco use: Secondary | ICD-10-CM

## 2012-08-31 DIAGNOSIS — J4489 Other specified chronic obstructive pulmonary disease: Secondary | ICD-10-CM | POA: Diagnosis present

## 2012-08-31 DIAGNOSIS — F17200 Nicotine dependence, unspecified, uncomplicated: Secondary | ICD-10-CM | POA: Diagnosis present

## 2012-08-31 DIAGNOSIS — F411 Generalized anxiety disorder: Secondary | ICD-10-CM | POA: Diagnosis present

## 2012-08-31 DIAGNOSIS — Z931 Gastrostomy status: Secondary | ICD-10-CM

## 2012-08-31 DIAGNOSIS — E46 Unspecified protein-calorie malnutrition: Secondary | ICD-10-CM | POA: Diagnosis present

## 2012-08-31 DIAGNOSIS — Z930 Tracheostomy status: Secondary | ICD-10-CM

## 2012-08-31 DIAGNOSIS — D649 Anemia, unspecified: Secondary | ICD-10-CM

## 2012-08-31 DIAGNOSIS — G894 Chronic pain syndrome: Secondary | ICD-10-CM

## 2012-08-31 DIAGNOSIS — K5900 Constipation, unspecified: Secondary | ICD-10-CM | POA: Diagnosis not present

## 2012-08-31 DIAGNOSIS — IMO0002 Reserved for concepts with insufficient information to code with codable children: Secondary | ICD-10-CM

## 2012-08-31 DIAGNOSIS — F3289 Other specified depressive episodes: Secondary | ICD-10-CM | POA: Diagnosis present

## 2012-08-31 DIAGNOSIS — I10 Essential (primary) hypertension: Secondary | ICD-10-CM | POA: Diagnosis present

## 2012-08-31 DIAGNOSIS — F329 Major depressive disorder, single episode, unspecified: Secondary | ICD-10-CM | POA: Diagnosis present

## 2012-08-31 DIAGNOSIS — J449 Chronic obstructive pulmonary disease, unspecified: Secondary | ICD-10-CM | POA: Diagnosis present

## 2012-08-31 DIAGNOSIS — E43 Unspecified severe protein-calorie malnutrition: Secondary | ICD-10-CM

## 2012-08-31 DIAGNOSIS — N179 Acute kidney failure, unspecified: Secondary | ICD-10-CM

## 2012-08-31 DIAGNOSIS — C770 Secondary and unspecified malignant neoplasm of lymph nodes of head, face and neck: Secondary | ICD-10-CM | POA: Diagnosis present

## 2012-08-31 DIAGNOSIS — Z8673 Personal history of transient ischemic attack (TIA), and cerebral infarction without residual deficits: Secondary | ICD-10-CM

## 2012-08-31 DIAGNOSIS — C320 Malignant neoplasm of glottis: Secondary | ICD-10-CM | POA: Diagnosis present

## 2012-08-31 DIAGNOSIS — E41 Nutritional marasmus: Secondary | ICD-10-CM

## 2012-08-31 DIAGNOSIS — C321 Malignant neoplasm of supraglottis: Principal | ICD-10-CM | POA: Diagnosis present

## 2012-08-31 DIAGNOSIS — F419 Anxiety disorder, unspecified: Secondary | ICD-10-CM

## 2012-08-31 DIAGNOSIS — R061 Stridor: Secondary | ICD-10-CM | POA: Diagnosis present

## 2012-08-31 DIAGNOSIS — E785 Hyperlipidemia, unspecified: Secondary | ICD-10-CM | POA: Diagnosis present

## 2012-08-31 HISTORY — PX: RADICAL NECK DISSECTION: SHX2284

## 2012-08-31 HISTORY — PX: LARYNGETOMY: SHX5202

## 2012-08-31 LAB — PREPARE RBC (CROSSMATCH)

## 2012-08-31 LAB — ABO/RH: ABO/RH(D): O POS

## 2012-08-31 SURGERY — LARYNGECTOMY
Anesthesia: General | Site: Neck | Wound class: Clean Contaminated

## 2012-08-31 MED ORDER — ATENOLOL 12.5 MG HALF TABLET
12.5000 mg | ORAL_TABLET | Freq: Two times a day (BID) | ORAL | Status: DC
  Administered 2012-08-31 – 2012-09-14 (×25): 12.5 mg
  Filled 2012-08-31 (×30): qty 1

## 2012-08-31 MED ORDER — 0.9 % SODIUM CHLORIDE (POUR BTL) OPTIME
TOPICAL | Status: DC | PRN
  Administered 2012-08-31: 1000 mL

## 2012-08-31 MED ORDER — FENTANYL 12 MCG/HR TD PT72
12.5000 ug | MEDICATED_PATCH | TRANSDERMAL | Status: DC
  Administered 2012-08-31: 12.5 ug via TRANSDERMAL
  Filled 2012-08-31: qty 1

## 2012-08-31 MED ORDER — ONDANSETRON HCL 4 MG PO TABS: 4.0000 mg | ORAL_TABLET | ORAL | Status: DC | PRN

## 2012-08-31 MED ORDER — MUPIROCIN CALCIUM 2 % EX CREA
TOPICAL_CREAM | CUTANEOUS | Status: AC
  Filled 2012-08-31: qty 15

## 2012-08-31 MED ORDER — RANITIDINE HCL 150 MG/10ML PO SYRP
150.0000 mg | ORAL_SOLUTION | Freq: Every day | ORAL | Status: DC
  Administered 2012-08-31 – 2012-09-14 (×15): 150 mg
  Filled 2012-08-31 (×16): qty 10

## 2012-08-31 MED ORDER — ONDANSETRON HCL 4 MG/2ML IJ SOLN
4.0000 mg | INTRAMUSCULAR | Status: DC | PRN
  Administered 2012-09-01 – 2012-09-11 (×10): 4 mg via INTRAVENOUS
  Filled 2012-08-31 (×22): qty 2

## 2012-08-31 MED ORDER — LACTATED RINGERS IV SOLN
INTRAVENOUS | Status: DC | PRN
  Administered 2012-08-31 (×2): via INTRAVENOUS

## 2012-08-31 MED ORDER — MIDAZOLAM HCL 2 MG/2ML IJ SOLN
INTRAMUSCULAR | Status: AC
  Filled 2012-08-31: qty 2

## 2012-08-31 MED ORDER — FENTANYL CITRATE 0.05 MG/ML IJ SOLN
INTRAMUSCULAR | Status: DC | PRN
  Administered 2012-08-31 (×3): 50 ug via INTRAVENOUS
  Administered 2012-08-31 (×2): 100 ug via INTRAVENOUS
  Administered 2012-08-31 (×2): 50 ug via INTRAVENOUS

## 2012-08-31 MED ORDER — BACITRACIN-NEOMYCIN-POLYMYXIN OINTMENT TUBE
1.0000 | TOPICAL_OINTMENT | Freq: Two times a day (BID) | CUTANEOUS | Status: DC
Start: 2012-08-31 — End: 2012-09-14
  Administered 2012-08-31 – 2012-09-05 (×9): 1 via TOPICAL
  Administered 2012-09-05: via TOPICAL
  Administered 2012-09-06 – 2012-09-14 (×12): 1 via TOPICAL
  Filled 2012-08-31 (×2): qty 15

## 2012-08-31 MED ORDER — CLINDAMYCIN PHOSPHATE 600 MG/50ML IV SOLN: 600.0000 mg | Freq: Once | INTRAVENOUS | Status: DC

## 2012-08-31 MED ORDER — ALBUMIN HUMAN 5 % IV SOLN
INTRAVENOUS | Status: DC | PRN
  Administered 2012-08-31: 13:00:00 via INTRAVENOUS

## 2012-08-31 MED ORDER — LORAZEPAM 1 MG PO TABS
1.0000 mg | ORAL_TABLET | Freq: Four times a day (QID) | ORAL | Status: DC | PRN
  Administered 2012-09-01 – 2012-09-12 (×24): 1 mg via ORAL
  Filled 2012-08-31 (×25): qty 1

## 2012-08-31 MED ORDER — OLMESARTAN-AMLODIPINE-HCTZ 40-5-25 MG PO TABS: 0.5000 | ORAL_TABLET | Freq: Every day | ORAL | Status: DC

## 2012-08-31 MED ORDER — DEXMEDETOMIDINE BOLUS VIA INFUSION: 1.0000 ug/kg | Freq: Once | INTRAVENOUS | Status: DC

## 2012-08-31 MED ORDER — LACTATED RINGERS IV SOLN
INTRAVENOUS | Status: DC
  Administered 2012-08-31: 10:00:00 via INTRAVENOUS

## 2012-08-31 MED ORDER — CLINDAMYCIN PHOSPHATE 600 MG/50ML IV SOLN
INTRAVENOUS | Status: DC | PRN
  Administered 2012-08-31: 600 mg via INTRAVENOUS

## 2012-08-31 MED ORDER — PHENYLEPHRINE HCL 10 MG/ML IJ SOLN
INTRAMUSCULAR | Status: DC | PRN
  Administered 2012-08-31: 40 ug via INTRAVENOUS
  Administered 2012-08-31: 80 ug via INTRAVENOUS
  Administered 2012-08-31 (×2): 40 ug via INTRAVENOUS
  Administered 2012-08-31: 80 ug via INTRAVENOUS
  Administered 2012-08-31: 40 ug via INTRAVENOUS
  Administered 2012-08-31 (×2): 80 ug via INTRAVENOUS
  Administered 2012-08-31 (×2): 40 ug via INTRAVENOUS
  Administered 2012-08-31: 80 ug via INTRAVENOUS
  Administered 2012-08-31: 40 ug via INTRAVENOUS
  Administered 2012-08-31: 120 ug via INTRAVENOUS

## 2012-08-31 MED ORDER — HYDROMORPHONE HCL PF 1 MG/ML IJ SOLN
INTRAMUSCULAR | Status: AC
  Filled 2012-08-31: qty 1

## 2012-08-31 MED ORDER — ONDANSETRON HCL 4 MG/2ML IJ SOLN
INTRAMUSCULAR | Status: DC | PRN
  Administered 2012-08-31 (×2): 4 mg via INTRAVENOUS

## 2012-08-31 MED ORDER — OXYCODONE HCL 5 MG PO TABS: 5.0000 mg | ORAL_TABLET | Freq: Once | ORAL | Status: DC | PRN

## 2012-08-31 MED ORDER — HYDROCHLOROTHIAZIDE 25 MG PO TABS
12.5000 mg | ORAL_TABLET | Freq: Every day | ORAL | Status: DC
  Administered 2012-08-31: 12.5 mg
  Filled 2012-08-31 (×2): qty 0.5

## 2012-08-31 MED ORDER — BACITRACIN ZINC 500 UNIT/GM EX OINT
TOPICAL_OINTMENT | CUTANEOUS | Status: DC | PRN
  Administered 2012-08-31: 1 via TOPICAL

## 2012-08-31 MED ORDER — NICOTINE 21 MG/24HR TD PT24
21.0000 mg | MEDICATED_PATCH | Freq: Every day | TRANSDERMAL | Status: DC
  Administered 2012-08-31 – 2012-09-14 (×15): 21 mg via TRANSDERMAL
  Filled 2012-08-31 (×15): qty 1

## 2012-08-31 MED ORDER — MIRTAZAPINE 15 MG PO TBDP
15.0000 mg | ORAL_TABLET | Freq: Every day | ORAL | Status: DC
  Administered 2012-08-31 – 2012-09-13 (×14): 15 mg via ORAL
  Filled 2012-08-31 (×16): qty 1

## 2012-08-31 MED ORDER — ROCURONIUM BROMIDE 100 MG/10ML IV SOLN
INTRAVENOUS | Status: DC | PRN
  Administered 2012-08-31: 50 mg via INTRAVENOUS

## 2012-08-31 MED ORDER — MIDAZOLAM HCL 2 MG/2ML IJ SOLN
2.0000 mg | Freq: Once | INTRAMUSCULAR | Status: AC
  Administered 2012-08-31: 2 mg via INTRAVENOUS

## 2012-08-31 MED ORDER — HYDROCODONE-ACETAMINOPHEN 7.5-500 MG/15ML PO SOLN
10.0000 mL | ORAL | Status: DC | PRN
  Administered 2012-08-31 – 2012-09-14 (×36): 15 mL via ORAL
  Filled 2012-08-31 (×39): qty 15

## 2012-08-31 MED ORDER — ALBUTEROL SULFATE (5 MG/ML) 0.5% IN NEBU
2.5000 mg | INHALATION_SOLUTION | RESPIRATORY_TRACT | Status: DC | PRN
  Filled 2012-08-31: qty 0.5

## 2012-08-31 MED ORDER — LIDOCAINE-EPINEPHRINE 1 %-1:100000 IJ SOLN
INTRAMUSCULAR | Status: AC
  Filled 2012-08-31: qty 1

## 2012-08-31 MED ORDER — AMITRIPTYLINE HCL 10 MG PO TABS
10.0000 mg | ORAL_TABLET | Freq: Every day | ORAL | Status: DC
  Administered 2012-08-31 – 2012-09-13 (×14): 10 mg via ORAL
  Filled 2012-08-31 (×16): qty 1

## 2012-08-31 MED ORDER — MORPHINE SULFATE 2 MG/ML IJ SOLN
2.0000 mg | INTRAMUSCULAR | Status: DC | PRN
  Administered 2012-09-01 – 2012-09-02 (×9): 2 mg via INTRAVENOUS
  Administered 2012-09-02: 4 mg via INTRAVENOUS
  Administered 2012-09-02 (×4): 2 mg via INTRAVENOUS
  Administered 2012-09-03 – 2012-09-04 (×9): 4 mg via INTRAVENOUS
  Administered 2012-09-04 (×2): 2 mg via INTRAVENOUS
  Administered 2012-09-04 – 2012-09-05 (×3): 4 mg via INTRAVENOUS
  Administered 2012-09-05: 2 mg via INTRAVENOUS
  Administered 2012-09-05 – 2012-09-06 (×4): 4 mg via INTRAVENOUS
  Administered 2012-09-06: 3 mg via INTRAVENOUS
  Administered 2012-09-06 – 2012-09-07 (×6): 4 mg via INTRAVENOUS
  Administered 2012-09-07 (×2): 3 mg via INTRAVENOUS
  Administered 2012-09-07 – 2012-09-08 (×4): 4 mg via INTRAVENOUS
  Administered 2012-09-08: 3 mg via INTRAVENOUS
  Administered 2012-09-08 – 2012-09-09 (×4): 4 mg via INTRAVENOUS
  Administered 2012-09-09: 3 mg via INTRAVENOUS
  Administered 2012-09-09: 4 mg via INTRAVENOUS
  Administered 2012-09-10: 2 mg via INTRAVENOUS
  Administered 2012-09-10: 4 mg via INTRAVENOUS
  Administered 2012-09-10 (×2): 2 mg via INTRAVENOUS
  Administered 2012-09-11 (×2): 4 mg via INTRAVENOUS
  Administered 2012-09-11: 2 mg via INTRAVENOUS
  Administered 2012-09-11 – 2012-09-12 (×4): 4 mg via INTRAVENOUS
  Administered 2012-09-12 (×2): 2 mg via INTRAVENOUS
  Administered 2012-09-13 (×3): 4 mg via INTRAVENOUS
  Filled 2012-08-31 (×3): qty 1
  Filled 2012-08-31 (×2): qty 2
  Filled 2012-08-31 (×2): qty 1
  Filled 2012-08-31 (×2): qty 2
  Filled 2012-08-31: qty 1
  Filled 2012-08-31: qty 2
  Filled 2012-08-31: qty 1
  Filled 2012-08-31: qty 2
  Filled 2012-08-31 (×3): qty 1
  Filled 2012-08-31 (×2): qty 2
  Filled 2012-08-31: qty 1
  Filled 2012-08-31 (×3): qty 2
  Filled 2012-08-31: qty 1
  Filled 2012-08-31 (×2): qty 2
  Filled 2012-08-31 (×2): qty 1
  Filled 2012-08-31 (×4): qty 2
  Filled 2012-08-31 (×2): qty 1
  Filled 2012-08-31 (×5): qty 2
  Filled 2012-08-31 (×2): qty 1
  Filled 2012-08-31: qty 2
  Filled 2012-08-31 (×2): qty 1
  Filled 2012-08-31 (×13): qty 2
  Filled 2012-08-31 (×2): qty 1
  Filled 2012-08-31 (×5): qty 2
  Filled 2012-08-31 (×2): qty 1
  Filled 2012-08-31 (×2): qty 2
  Filled 2012-08-31: qty 1
  Filled 2012-08-31: qty 2
  Filled 2012-08-31: qty 1
  Filled 2012-08-31: qty 2
  Filled 2012-08-31 (×2): qty 1
  Filled 2012-08-31: qty 2

## 2012-08-31 MED ORDER — KCL IN DEXTROSE-NACL 20-5-0.45 MEQ/L-%-% IV SOLN
INTRAVENOUS | Status: AC
  Filled 2012-08-31: qty 1000

## 2012-08-31 MED ORDER — ONDANSETRON HCL 4 MG/5ML PO SOLN: 4.0000 mg | Freq: Two times a day (BID) | ORAL | Status: DC | PRN

## 2012-08-31 MED ORDER — HYDROMORPHONE HCL PF 1 MG/ML IJ SOLN
0.2500 mg | INTRAMUSCULAR | Status: DC | PRN
  Administered 2012-08-31 (×2): 0.5 mg via INTRAVENOUS

## 2012-08-31 MED ORDER — KCL IN DEXTROSE-NACL 20-5-0.45 MEQ/L-%-% IV SOLN
INTRAVENOUS | Status: DC
  Administered 2012-08-31 – 2012-09-04 (×4): via INTRAVENOUS
  Administered 2012-09-06: 30 mL/h via INTRAVENOUS
  Filled 2012-08-31 (×11): qty 1000

## 2012-08-31 MED ORDER — SODIUM CHLORIDE 0.9 % IV SOLN
10.0000 mg | INTRAVENOUS | Status: DC | PRN
  Administered 2012-08-31: 20 ug/min via INTRAVENOUS

## 2012-08-31 MED ORDER — ACETAMINOPHEN 650 MG RE SUPP: 650.0000 mg | RECTAL | Status: DC | PRN

## 2012-08-31 MED ORDER — BACITRACIN ZINC 500 UNIT/GM EX OINT
TOPICAL_OINTMENT | CUTANEOUS | Status: AC
  Filled 2012-08-31: qty 15

## 2012-08-31 MED ORDER — AMLODIPINE BESYLATE 2.5 MG PO TABS
2.5000 mg | ORAL_TABLET | Freq: Every day | ORAL | Status: DC
  Administered 2012-08-31 – 2012-09-02 (×2): 2.5 mg
  Filled 2012-08-31 (×3): qty 1

## 2012-08-31 MED ORDER — IRBESARTAN 150 MG PO TABS
150.0000 mg | ORAL_TABLET | Freq: Every day | ORAL | Status: DC
  Administered 2012-08-31 – 2012-09-01 (×2): 150 mg
  Filled 2012-08-31 (×2): qty 1

## 2012-08-31 MED ORDER — MIDAZOLAM HCL 5 MG/5ML IJ SOLN
INTRAMUSCULAR | Status: DC | PRN
  Administered 2012-08-31: 1 mg via INTRAVENOUS

## 2012-08-31 MED ORDER — ACETAMINOPHEN 160 MG/5ML PO SOLN: 650.0000 mg | ORAL | Status: DC | PRN

## 2012-08-31 MED ORDER — OXYCODONE HCL 5 MG/5ML PO SOLN: 5.0000 mg | Freq: Once | ORAL | Status: DC | PRN

## 2012-08-31 MED ORDER — BACITRACIN ZINC 500 UNIT/GM EX OINT
1.0000 "application " | TOPICAL_OINTMENT | Freq: Three times a day (TID) | CUTANEOUS | Status: DC
  Administered 2012-08-31 – 2012-09-14 (×33): 1 via TOPICAL
  Filled 2012-08-31 (×5): qty 15

## 2012-08-31 MED ORDER — ACETAMINOPHEN 10 MG/ML IV SOLN
INTRAVENOUS | Status: AC
  Administered 2012-08-31: 1000 mg
  Filled 2012-08-31: qty 100

## 2012-08-31 MED ORDER — SIMVASTATIN 20 MG PO TABS
20.0000 mg | ORAL_TABLET | Freq: Every day | ORAL | Status: DC
  Administered 2012-09-01 – 2012-09-13 (×13): 20 mg via ORAL
  Filled 2012-08-31 (×16): qty 1

## 2012-08-31 MED ORDER — CLINDAMYCIN PHOSPHATE 600 MG/50ML IV SOLN
600.0000 mg | Freq: Three times a day (TID) | INTRAVENOUS | Status: AC
  Administered 2012-08-31 – 2012-09-01 (×3): 600 mg via INTRAVENOUS
  Filled 2012-08-31 (×4): qty 50

## 2012-08-31 MED ORDER — DEXMEDETOMIDINE HCL IN NACL 200 MCG/50ML IV SOLN
0.4000 ug/kg/h | INTRAVENOUS | Status: DC
  Filled 2012-08-31: qty 50

## 2012-08-31 MED ORDER — PROPOFOL 10 MG/ML IV BOLUS
INTRAVENOUS | Status: DC | PRN
  Administered 2012-08-31: 100 mg via INTRAVENOUS
  Administered 2012-08-31: 50 mg via INTRAVENOUS

## 2012-08-31 MED ORDER — VECURONIUM BROMIDE 10 MG IV SOLR
INTRAVENOUS | Status: DC | PRN
  Administered 2012-08-31: 6 mg via INTRAVENOUS

## 2012-08-31 MED ORDER — MUPIROCIN CALCIUM 2 % EX CREA: TOPICAL_CREAM | CUTANEOUS | Status: DC | PRN

## 2012-08-31 SURGICAL SUPPLY — 59 items
ATTRACTOMAT 16X20 MAGNETIC DRP (DRAPES) ×1 IMPLANT
BLADE SURG 15 STRL LF DISP TIS (BLADE) IMPLANT
BLADE SURG 15 STRL SS (BLADE)
CANISTER SUCTION 2500CC (MISCELLANEOUS) ×3 IMPLANT
CLEANER TIP ELECTROSURG 2X2 (MISCELLANEOUS) ×3 IMPLANT
CLOTH BEACON ORANGE TIMEOUT ST (SAFETY) ×3 IMPLANT
CORDS BIPOLAR (ELECTRODE) ×3 IMPLANT
COVER SURGICAL LIGHT HANDLE (MISCELLANEOUS) ×3 IMPLANT
DRAIN CHANNEL 10F 3/8 F FF (DRAIN) ×3 IMPLANT
DRAIN CHANNEL 15F RND FF W/TCR (WOUND CARE) IMPLANT
DRAIN HEMOVAC 1/8 X 5 (WOUND CARE) IMPLANT
ELECT COATED BLADE 2.86 ST (ELECTRODE) ×3 IMPLANT
ELECT REM PT RETURN 9FT ADLT (ELECTROSURGICAL) ×3
ELECTRODE REM PT RTRN 9FT ADLT (ELECTROSURGICAL) ×2 IMPLANT
EVACUATOR SILICONE 100CC (DRAIN) ×5 IMPLANT
GAUZE SPONGE 4X4 16PLY XRAY LF (GAUZE/BANDAGES/DRESSINGS) ×5 IMPLANT
GLOVE BIO SURGEON STRL SZ8 (GLOVE) ×2 IMPLANT
GLOVE BIOGEL M 7.0 STRL (GLOVE) ×6 IMPLANT
GLOVE BIOGEL PI IND STRL 6.5 (GLOVE) IMPLANT
GLOVE BIOGEL PI IND STRL 8 (GLOVE) IMPLANT
GLOVE BIOGEL PI INDICATOR 6.5 (GLOVE) ×2
GLOVE BIOGEL PI INDICATOR 8 (GLOVE) ×1
GLOVE SURG SS PI 6.5 STRL IVOR (GLOVE) ×1 IMPLANT
GLOVE SURG SS PI 7.5 STRL IVOR (GLOVE) ×2 IMPLANT
GOWN PREVENTION PLUS XLARGE (GOWN DISPOSABLE) ×1 IMPLANT
GOWN STRL NON-REIN LRG LVL3 (GOWN DISPOSABLE) ×7 IMPLANT
KIT BASIN OR (CUSTOM PROCEDURE TRAY) ×3 IMPLANT
KIT ROOM TURNOVER OR (KITS) ×3 IMPLANT
LOCATOR NERVE 3 VOLT (DISPOSABLE) IMPLANT
NS IRRIG 1000ML POUR BTL (IV SOLUTION) ×3 IMPLANT
PAD ARMBOARD 7.5X6 YLW CONV (MISCELLANEOUS) ×6 IMPLANT
PENCIL BUTTON HOLSTER BLD 10FT (ELECTRODE) ×3 IMPLANT
SPECIMEN JAR MEDIUM (MISCELLANEOUS) ×4 IMPLANT
SPONGE INTESTINAL PEANUT (DISPOSABLE) ×1 IMPLANT
SPONGE LAP 18X18 X RAY DECT (DISPOSABLE) ×3 IMPLANT
STAPLER VISISTAT 35W (STAPLE) ×3 IMPLANT
SUT CHROMIC 3 0 SH 27 (SUTURE) ×7 IMPLANT
SUT CHROMIC 5 0 P 3 (SUTURE) IMPLANT
SUT ETHILON 2 0 FS 18 (SUTURE) IMPLANT
SUT ETHILON 3 0 FSL (SUTURE) ×3 IMPLANT
SUT ETHILON 5 0 PS 2 18 (SUTURE) ×1 IMPLANT
SUT SILK 2 0 (SUTURE) ×3
SUT SILK 2 0 FS (SUTURE) ×1 IMPLANT
SUT SILK 2 0 SH (SUTURE) ×1 IMPLANT
SUT SILK 2 0 SH CR/8 (SUTURE) ×3 IMPLANT
SUT SILK 2-0 18XBRD TIE 12 (SUTURE) ×2 IMPLANT
SUT SILK 3 0 REEL (SUTURE) ×4 IMPLANT
SUT SILK 4 0 (SUTURE) ×3
SUT SILK 4-0 18XBRD TIE 12 (SUTURE) ×4 IMPLANT
SUT VIC AB 3-0 FS2 27 (SUTURE) IMPLANT
SUT VIC AB 3-0 SH 18 (SUTURE) ×1 IMPLANT
SUT VIC AB 3-0 SH 27 (SUTURE) ×3
SUT VIC AB 3-0 SH 27X BRD (SUTURE) IMPLANT
TOWEL OR 17X24 6PK STRL BLUE (TOWEL DISPOSABLE) ×4 IMPLANT
TOWEL OR 17X26 10 PK STRL BLUE (TOWEL DISPOSABLE) ×3 IMPLANT
TRAY ENT MC OR (CUSTOM PROCEDURE TRAY) ×3 IMPLANT
TRAY FOLEY CATH 14FRSI W/METER (CATHETERS) ×1 IMPLANT
TUBE FEEDING 10FR FLEXIFLO (MISCELLANEOUS) IMPLANT
WATER STERILE IRR 1000ML POUR (IV SOLUTION) ×2 IMPLANT

## 2012-08-31 NOTE — Brief Op Note (Signed)
08/31/2012  4:30 PM  PATIENT:  [name]  67 y.o. female  PRE-OPERATIVE DIAGNOSIS:  supraglottic carcinoma  POST-OPERATIVE DIAGNOSIS:  supraglottic carcinoma  PROCEDURE:  TOTAL LARYNGECTOMY   BILAT SELECTIVE NECK DISSECTIONS  SURGEON:  Surgeon(s) and Role:    * Jerrell Belfast, MD - Primary    * Ruby Cola, MD - Assisting  PHYSICIAN ASSISTANT:   ASSISTANTS: Gore   ANESTHESIA:   general  EBL:  Total I/O In: 3150 [I.V.:2200; Blood:700; IV Piggyback:250] Out: 930 [Urine:630; Blood:300]  BLOOD ADMINISTERED:300 CC PRBC  DRAINS: (58mm) Jackson-Pratt drain(s) with closed bulb suction in the neck   LOCAL MEDICATIONS USED:  NONE  SPECIMEN:  Source of Specimen:  Bilat Necks and Larynx  DISPOSITION OF SPECIMEN:  PATHOLOGY  COUNTS:  YES  TOURNIQUET:  * No tourniquets in log *  DICTATION: .Other Dictation: Dictation Number [id number]  PLAN OF CARE: Admit to inpatient   PATIENT DISPOSITION:  PACU - hemodynamically stable.   Delay start of Pharmacological VTE agent (>24hrs) due to surgical blood loss or risk of bleeding: no

## 2012-08-31 NOTE — H&P (Signed)
[name] is an 66 y.o. female.   Chief Complaint: Supraglottic CA HPI: Pt adm to [facility] hosp for chest pain, rib fractures and stridor. Required trach and bx of Ca. T4N1 SqCCa of the supraglottic larynx.  Past Medical History  Diagnosis Date  . Hypercholesteremia     takes Pravastatin daily  . Uterine cancer   . COPD (chronic obstructive pulmonary disease) 08/10/2012  . Hiatal hernia 08/10/2012  . 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 Hypertension     takes Tribenzor and Atenolol daily  . Stroke 2011  . Headache   . GERD (gastroesophageal reflux disease)     takes Zantac daily  . Chronic back pain   . Constipation     related to pain meds  . Nausea     takes Zofran prn  . Anxiety     takes Ativan prn  . Depression   . Insomnia     takes Amitriptyline daily  . Broken ribs     Past Surgical History  Procedure Date  . Abdominal surgery     r/t uterine carcinoma  . Tracheostomy tube placement 08/10/2012    Procedure: TRACHEOSTOMY;  Surgeon: Jerrell Belfast, MD;  Location: [facility];  Service: ENT;  Laterality: N/A;  . Laryngoscopy 08/10/2012    Procedure: LARYNGOSCOPY;  Surgeon: Jerrell Belfast, MD;  Location: [facility];  Service: ENT;  Laterality: N/A;  with biopsy  . Appendectomy   . Hernia repair   . Gastrostomy w/ feeding tube     placed last week    History reviewed. No pertinent family history. Social History:  reports that she has been smoking.  She does not have any smokeless tobacco history on file. She reports that she does not drink alcohol or use illicit drugs.  Allergies: No Known Allergies  Medications Prior to Admission  Medication Sig Dispense Refill  . amitriptyline (ELAVIL) 10 MG tablet Take 10 mg by mouth at bedtime.      [name] atenolol (TENORMIN) 50 MG tablet Take 12.5 mg by mouth See admin instructions. Twice Daily: Give morning dose via tube; give evening dose by mouth if able      . clopidogrel (PLAVIX) 75 MG tablet Place 75 mg  into feeding tube every morning.       . feeding supplement (ENSURE COMPLETE) LIQD Take 237 mLs by mouth as needed. For additional nutrition      . HYDROcodone-acetaminophen (NORCO) 10-325 MG per tablet Take 1 tablet by mouth every 6 (six) hours as needed. For pain      . HYDROmorphone (DILAUDID) 8 MG tablet Take 8 mg by mouth every 4 (four) hours as needed. For pain      . LORazepam (ATIVAN) 1 MG tablet Take 1 mg by mouth every 6 (six) hours as needed. For anxiety      . mirtazapine (REMERON SOL-TAB) 15 MG disintegrating tablet Take 15 mg by mouth at bedtime.      . nicotine (NICODERM CQ - DOSED IN MG/24 HOURS) 21 mg/24hr patch Place 1 patch onto the skin daily.      . Olmesartan-Amlodipine-HCTZ 40-5-25 MG TABS Place 0.5 tablets into feeding tube daily.       . ondansetron (ZOFRAN) 4 MG/5ML solution Take 4 mg by mouth 2 (two) times daily as needed.      . pravastatin (PRAVACHOL) 40 MG tablet Place 40 mg into feeding tube daily.       . ranitidine (ZANTAC) 150 MG/10ML syrup Place  150 mg into feeding tube daily.      [name] antiseptic oral rinse (BIOTENE) LIQD 15 mLs by Mouth Rinse route 2 times daily at 12 noon and 4 pm.      . chlorhexidine (PERIDEX) 0.12 % solution Use as directed 15 mLs in the mouth or throat 2 (two) times daily.  120 mL  0  . fentaNYL (DURAGESIC - DOSED MCG/HR) 12 MCG/HR Place 1 patch onto the skin every 3 (three) days.      [name] neomycin-bacitracin-polymyxin (NEOSPORIN) OINT Apply 1 application topically daily. To trach site      . Nutritional Supplements (FEEDING SUPPLEMENT, OSMOLITE 1.2 CAL,) LIQD Place 1,000 mLs into feeding tube continuous.      . Water For Irrigation, Sterile (FREE WATER) SOLN Place 30-60 mLs into feeding tube every 6 (six) hours.        Results for orders placed during the hospital encounter of 08/30/12 (from the past 48 hour(s))  GLUCOSE, CAPILLARY     Status: Abnormal   Collection Time   08/30/12  1:16 PM      Component Value Range Comment    Glucose-Capillary 106 (*) 70 - 99 mg/dL    Nm Pet Image Initial (pi) Skull Base To Thigh  08/30/2012  *RADIOLOGY REPORT*  Clinical Data: Initial treatment strategy for head and neck. 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 CT data was obtained and used for attenuation correction and anatomic localization only.  (This was not acquired as a diagnostic CT examination.) Additional exam technical data entered on technologist worksheet.  Comparison:  Neck CT 08/08/2012  Findings:  Neck: There is an  intensely hypermetabolic mass centered within the posterior right aspect of the glottis with SUV max = [id number]  Hypermetabolic enlarged right level II lymph node measuring 18 mm (image 27) with SUV max = 7.5 consistent with local nodal metastasis.  No additional evidence of metastatic disease in the neck.  There is intense activity associated with tracheostomy site related to inflammation from recent surgery.  Chest:  Within the left chest wall,  there is focal intense hypermetabolic active associated with two adjacent fractures within the left lateral fifth and sixth ribs (images 65 and 74 respectively).  There is mild callus formation at the fracture sites.  No hypermetabolic mediastinal lymph nodes.  There is small focus of atelectasis at the right lung base (image 89) with associated metabolic activity which is likely inflammatory or infectious.  No suspicious pulmonary nodules.  Abdomen/Pelvis:  No abnormal hypermetabolic activity within the liver, pancreas, adrenal glands, or spleen.  No hypermetabolic lymph nodes in the abdomen or pelvis.  Skeleton:  No focal hypermetabolic activity to suggest skeletal metastasis. Left rib fractures as above.  IMPRESSION:  1.  Intensely hypermetabolic primary carcinoma within the posterior right glottis. 2.  Local nodal metastasis to the right  level II station. 3.  Intense uptake in adjacent left lateral ribs fractures are in a traumatic pattern.  Cannot completely exclude pathologic fractures although not favored.  Recommend correlation with trauma. 4.  No evidence of systemic disease otherwise.  5.  Mild metabolic activity associated with right lower lobe atelectasis is likely infectious or inflammatory.   Original Report Authenticated By: Suzy Bouchard, M.D.     Review of Systems  Constitutional: Positive for weight loss.  Respiratory: Positive for stridor.   Cardiovascular: Negative.  Gastrointestinal: Negative.   Musculoskeletal: Negative.   Neurological: Negative.     Blood pressure 117/69, pulse 58, temperature 98.6 F (37 C), temperature source Oral, resp. rate 18, weight 49.215 kg (108 lb 8 oz), SpO2 96.00%. Physical Exam  Constitutional: She is oriented to person, place, and time. Vital signs are normal. She appears cachectic.  HENT:       Supraglottic ca Trach in place  Neck: Normal range of motion. Neck supple.  Cardiovascular: Normal rate and regular rhythm.   Respiratory: Effort normal and breath sounds normal.  GI: Soft.  Musculoskeletal: Normal range of motion.  Lymphadenopathy:    She has cervical adenopathy.  Neurological: She is alert and oriented to person, place, and time.     Assessment/Plan: Adm of surgical management of Supraglottic Ca.    [place], Oluwanifemi Petitti 08/31/2012, 9:14 AM

## 2012-08-31 NOTE — Progress Notes (Signed)
Paged Dr. [name] to sign orders in epic. He stated he was not in hospital and could not sign orders. Told me to do whatever I needed to do to the orders. I explained that I could not act on the orders until he signed them. Explained that consent would have to be signed in holding area if orders were not signed in epic. He stated that would be fine.

## 2012-08-31 NOTE — Progress Notes (Signed)
Pt admitted from Or. Report received from [place], [place].  Pt admitted without s/s of distress.  Pt placed on the monitor.  VSS. Family at bedside. Will continue to monitor.

## 2012-08-31 NOTE — Anesthesia Preprocedure Evaluation (Signed)
Anesthesia Evaluation  Patient identified by MRN, date of birth, ID band Patient awake    Reviewed: Allergy & Precautions, H&P , NPO status , Patient's Chart, lab work & pertinent test results  Airway Mallampati: II TM Distance: >3 FB Neck ROM: Full    Dental No notable dental hx. (+) Upper Dentures, Edentulous Upper and Dental Advisory Given   Pulmonary COPD breath sounds clear to auscultation  Pulmonary exam normal       Cardiovascular hypertension, On Home Beta Blockers Rhythm:Regular Rate:Normal     Neuro/Psych PSYCHIATRIC DISORDERS CVA, No Residual Symptoms    GI/Hepatic Neg liver ROS, hiatal hernia, GERD-  Medicated and Controlled,  Endo/Other  negative endocrine ROS  Renal/GU Renal diseasenegative Renal ROS  negative genitourinary   Musculoskeletal   Abdominal   Peds  Hematology negative hematology ROS (+)   Anesthesia Other Findings   Reproductive/Obstetrics negative OB ROS                           Anesthesia Physical Anesthesia Plan  ASA: III  Anesthesia Plan: General   Post-op Pain Management:    Induction: Intravenous  Airway Management Planned: Tracheostomy  Additional Equipment:   Intra-op Plan:   Post-operative Plan: Extubation in OR  Informed Consent: I have reviewed the patients History and Physical, chart, labs and discussed the procedure including the risks, benefits and alternatives for the proposed anesthesia with the patient or authorized representative who has indicated his/her understanding and acceptance.   Dental advisory given  Plan Discussed with: CRNA  Anesthesia Plan Comments:         Anesthesia Quick Evaluation

## 2012-08-31 NOTE — Progress Notes (Signed)
Call to dr. Linna Caprice to report increase respirations in 40s and and anxiety.  He to come to bedside and assess.

## 2012-08-31 NOTE — Progress Notes (Signed)
Dr. Linna Caprice at bedside. New orders received for  2 mg versed.

## 2012-08-31 NOTE — Transfer of Care (Signed)
Immediate Anesthesia Transfer of Care Note  Patient: [name]  Procedure(s) Performed: Procedure(s) (LRB) with comments: LARYNGECTOMY (N/A) RADICAL NECK DISSECTION (Bilateral)  Patient Location: PACU  Anesthesia Type: General  Level of Consciousness: awake, alert  and oriented  Airway & Oxygen Therapy: Patient Spontanous Breathing and Patient connected to tracheostomy mask oxygen  Post-op Assessment: Report given to PACU RN, Post -op Vital signs reviewed and stable and Patient moving all extremities X 4  Post vital signs: Reviewed and stable  Complications: No apparent anesthesia complications

## 2012-08-31 NOTE — Preoperative (Signed)
Beta Blockers   Reason not to administer Beta Blockers:Not Applicable 

## 2012-08-31 NOTE — Anesthesia Postprocedure Evaluation (Signed)
  Anesthesia Post-op Note  Patient: [name]  Procedure(s) Performed: Procedure(s) (LRB) with comments: LARYNGECTOMY (N/A) RADICAL NECK DISSECTION (Bilateral)  Patient Location: PACU  Anesthesia Type: General  Level of Consciousness: awake and alert   Airway and Oxygen Therapy: Patient Spontanous Breathing and Patient connected to tracheostomy mask oxygen  Post-op Pain: mild  Post-op Assessment: Post-op Vital signs reviewed and Patient's Cardiovascular Status Stable  Post-op Vital Signs: stable  Complications: No apparent anesthesia complications

## 2012-08-31 NOTE — Anesthesia Procedure Notes (Signed)
Date/Time: 08/31/2012 10:45 AM Performed by: Neldon Newport Pre-anesthesia Checklist: Patient being monitored, Suction available, Emergency Drugs available, Patient identified and Timeout performed Oxygen Delivery Method: Circle system utilized Intubation Type: IV induction Tube size: 6.0 mm Number of attempts: 1 (Dr. Simeon Craft exchanged tracheostomy tube out for reinforced 6.0 ETT.  BBSE  +ETCO2.  VSS) Placement Confirmation: positive ETCO2 and breath sounds checked- equal and bilateral Tube secured with: sutured.

## 2012-08-31 NOTE — Consult Note (Signed)
Name: [name] MRN: [id number] DOB: November 04, 1946    LOS: 0  [place] [facility] Critical [facility] Note   History of Present Illness:  66 y/o spanish speaking F with recent admit 8/26-9/9 with rib fx's, stridor and left chest pain work up demonstrated a soft tissue mass in larynx above vocal cords, underwent biopsy and was diagnosed with supraglottic carcinoma (T4N1) s/p tracheostomy [name]) and PEG placement per IR.  [name]) admitted on 9/18 for elective total laryngectomy, required 2 units PRBC's in OR.    Followed by Dr. Humphrey Rolls for Oncology.  PCCM consulted for post-op ICU medical mgmt.    PMH:  HLD, COPD, HTN, CVA, GERD, Depression, Insomnia, SCC of supraglottis   Lines / Drains: Trach 8/28>>> Neck Drains 9/18>>>  Cultures:   Antibiotics: Clinda (post-op) 9/18>>>  Tests / Events:    Past Medical History  Diagnosis Date  . Hypercholesteremia     takes Pravastatin daily  . Uterine cancer   . COPD (chronic obstructive pulmonary disease) 08/10/2012  . Hiatal hernia 08/10/2012  . 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 Hypertension     takes Tribenzor and Atenolol daily  . Stroke 2011  . Headache   . GERD (gastroesophageal reflux disease)     takes Zantac daily  . Chronic back pain   . Constipation     related to pain meds  . Nausea     takes Zofran prn  . Anxiety     takes Ativan prn  . Depression   . Insomnia     takes Amitriptyline daily  . Broken ribs     Past Surgical History  Procedure Date  . Abdominal surgery     r/t uterine carcinoma  . Tracheostomy tube placement 08/10/2012    Procedure: TRACHEOSTOMY;  Surgeon: Jerrell Belfast, MD;  Location: [facility];  Service: ENT;  Laterality: N/A;  . Laryngoscopy 08/10/2012    Procedure: LARYNGOSCOPY;  Surgeon: Jerrell Belfast, MD;  Location: [facility];  Service: ENT;  Laterality: N/A;  with biopsy  . Appendectomy   . Hernia repair   . Gastrostomy w/ feeding tube     placed last week     Prior to Admission medications   Medication Sig Start Date End Date Taking? Authorizing Provider  amitriptyline (ELAVIL) 10 MG tablet Take 10 mg by mouth at bedtime.   Yes Historical Provider, MD  atenolol (TENORMIN) 50 MG tablet Take 12.5 mg by mouth See admin instructions. Twice Daily: Give morning dose via tube; give evening dose by mouth if able   Yes Historical Provider, MD  clopidogrel (PLAVIX) 75 MG tablet Place 75 mg into feeding tube every morning.    Yes Historical Provider, MD  feeding supplement (ENSURE COMPLETE) LIQD Take 237 mLs by mouth as needed. For additional nutrition 08/22/12  Yes Robbie Lis, MD  HYDROcodone-acetaminophen [facility]) 10-325 MG per tablet Take 1 tablet by mouth every 6 (six) hours as needed. For pain 08/22/12 09/01/12 Yes Robbie Lis, MD  HYDROmorphone (DILAUDID) 8 MG tablet Take 8 mg by mouth every 4 (four) hours as needed. For pain 08/22/12 09/01/12 Yes Robbie Lis, MD  LORazepam (ATIVAN) 1 MG tablet Take 1 mg by mouth every 6 (six) hours as needed. For anxiety 08/22/12 09/01/12 Yes Alma Vivi Ferns, MD  mirtazapine (REMERON SOL-TAB) 15 MG disintegrating tablet Take 15 mg by mouth at bedtime. 08/22/12 09/21/12 Yes Robbie Lis, MD  nicotine (NICODERM CQ - DOSED IN MG/24 HOURS) 21 mg/24hr  patch Place 1 patch onto the skin daily. 08/22/12 09/21/12 Yes Robbie Lis, MD  Olmesartan-Amlodipine-HCTZ 40-5-25 MG TABS Place 0.5 tablets into feeding tube daily.    Yes Historical Provider, MD  ondansetron (ZOFRAN) 4 MG/5ML solution Take 4 mg by mouth 2 (two) times daily as needed.   Yes Historical Provider, MD  pravastatin (PRAVACHOL) 40 MG tablet Place 40 mg into feeding tube daily.    Yes Historical Provider, MD  ranitidine (ZANTAC) 150 MG/10ML syrup Place 150 mg into feeding tube daily. 08/22/12 09/21/12 Yes Robbie Lis, MD  antiseptic oral rinse (BIOTENE) LIQD 15 mLs by Mouth Rinse route 2 times daily at 12 noon and 4 pm. 08/22/12   Robbie Lis, MD  chlorhexidine (PERIDEX) 0.12 %  solution Use as directed 15 mLs in the mouth or throat 2 (two) times daily. 08/22/12 09/05/12  Robbie Lis, MD  fentaNYL (DURAGESIC - DOSED MCG/HR) 12 MCG/HR Place 1 patch onto the skin every 3 (three) days. 08/22/12 09/21/12  Robbie Lis, MD  neomycin-bacitracin-polymyxin (NEOSPORIN) OINT Apply 1 application topically daily. To trach site 08/22/12   Robbie Lis, MD  Nutritional Supplements (FEEDING SUPPLEMENT, OSMOLITE 1.2 CAL,) LIQD Place 1,000 mLs into feeding tube continuous. 08/22/12   Robbie Lis, MD  Water For Irrigation, Sterile (FREE WATER) SOLN Place 30-60 mLs into feeding tube every 6 (six) hours. 08/22/12   Robbie Lis, MD    Allergies No Known Allergies  Family History History reviewed. No pertinent family history.  Social History  reports that she has been smoking.  She does not have any smokeless tobacco history on file. She reports that she does not drink alcohol or use illicit drugs.  Review Of Systems: unable to obtain / post-op sedation  Vital Signs: Temp:  [98.1 F (36.7 C)-98.6 F (37 C)] 98.1 F (36.7 C) (09/18 1615) Pulse Rate:  [58-81] 81  (09/18 1615) Resp:  [18-20] 20  (09/18 1615) BP: (117-152)/(69-100) 152/100 mmHg (09/18 1615) SpO2:  [96 %-100 %] 100 % (09/18 1615) FiO2 (%):  [28 %] 28 % (09/18 1615) Weight:  [108 lb 8 oz (49.215 kg)] 108 lb 8 oz (49.215 kg) (09/18 0809)    Physical Examination: General: chronically ill in NAD HEENT: staples, drains intact, dressing c/d/i Neuro: sedate, arouses but drifts back to sleep CV: s1s2 rrr, no m/r/g PULM: resp's even/non-labored, lungs bilaterally clear GI: flat, soft, bsx4 active Extremities: warm/dry, no edema  Ventilator settings: Vent Mode:  [-]  FiO2 (%):  [28 %] 28 %  Labs    CBC No results found for this basename: HGB:3,HCT:3,WBC:3,PLT:3 in the last 168 hours   BMET No results found for this basename: NA:5,K:2,CL:5,CO2:5,GLUCOSE:5,BUN:5,CREATININE:5,CALCIUM:5,MG:5,PHOS:5 in the last 168  hours  No results found for this basename: INR:5 in the last 168 hours  No results found for this basename: PHART:5,PCO2:5,PCO2ART:5,PO2:5,PO2ART:5,HCO3:5,TCO2:5,O2SAT:5 in the last 168 hours   Radiology: 9/18 CXR>>>No active disease. Stable tracheostomy tube position.  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 [name] -follow up with Oncology as outpatient   HTN HLD Assessment:  Plan: -hold plavix (hx of CVA) -hold anti-hypertensives (consider restart in am 9/19)    Tobacco Abuse Presumed COPD Assessment: Continued smoker up to last admit.   Plan: -  nicotine patch -PRN BD's  Depression / Anxiety Acute on Chronic Pain  Assessment: on high dose chronic narcotics at home.  Pain control may be an issue.   Plan: -continue home medications -post op pain control per surgery   Protein Calorie Malnutrition Status Post PEG GERD  Assessment:  Plan: -nutrition consult -TF per PEG    Thank you for the consult.  Will follow along with you in ICU.   Noe Gens, NP-C [place] [facility] Pgr: [phone]  08/31/2012, 4:39 PM   Reviewed above, examined pt, and agree with assessment/plan.  She has recent dx of SCC of supraglottis and required trach, and now s/p more definitive surgery.  She will need f/u with oncology as outpt.  She had PEG for nutrition support.  Per records she recently quit smoking.  She carries a dx of COPD, but not sure if she ever had PFTs.  Will monitor medical status while in ICU.  Chesley Mires, MD [facility] Pulmonary/Critical Care 08/31/2012, 5:15 PM Pager:  [phone] After 3pm call: [phone]

## 2012-09-01 ENCOUNTER — Inpatient Hospital Stay (HOSPITAL_COMMUNITY): Payer: Medicaid Other

## 2012-09-01 ENCOUNTER — Encounter (HOSPITAL_COMMUNITY): Payer: Self-pay | Admitting: Otolaryngology

## 2012-09-01 DIAGNOSIS — N179 Acute kidney failure, unspecified: Secondary | ICD-10-CM

## 2012-09-01 DIAGNOSIS — K449 Diaphragmatic hernia without obstruction or gangrene: Secondary | ICD-10-CM

## 2012-09-01 LAB — BASIC METABOLIC PANEL
BUN: 36 mg/dL — ABNORMAL HIGH (ref 6–23)
CO2: 23 mEq/L (ref 19–32)
Calcium: 9.2 mg/dL (ref 8.4–10.5)
Creatinine, Ser: 1.45 mg/dL — ABNORMAL HIGH (ref 0.50–1.10)
Glucose, Bld: 143 mg/dL — ABNORMAL HIGH (ref 70–99)

## 2012-09-01 LAB — POCT I-STAT 4, (NA,K, GLUC, HGB,HCT): Potassium: 4.6 mEq/L (ref 3.5–5.1)

## 2012-09-01 LAB — CBC
Hemoglobin: 10.3 g/dL — ABNORMAL LOW (ref 12.0–15.0)
MCH: 27.7 pg (ref 26.0–34.0)
MCHC: 33.7 g/dL (ref 30.0–36.0)
MCV: 82.3 fL (ref 78.0–100.0)
RBC: 3.72 MIL/uL — ABNORMAL LOW (ref 3.87–5.11)

## 2012-09-01 MED ORDER — BIOTENE DRY MOUTH MT LIQD
15.0000 mL | Freq: Two times a day (BID) | OROMUCOSAL | Status: DC
  Administered 2012-09-01 – 2012-09-12 (×16): 15 mL via OROMUCOSAL

## 2012-09-01 MED ORDER — OSMOLITE 1.2 CAL PO LIQD
1000.0000 mL | ORAL | Status: DC
  Administered 2012-09-01 – 2012-09-03 (×3): 1000 mL
  Filled 2012-09-01 (×7): qty 1000

## 2012-09-01 MED ORDER — CHLORHEXIDINE GLUCONATE 0.12 % MT SOLN
15.0000 mL | Freq: Two times a day (BID) | OROMUCOSAL | Status: DC
  Administered 2012-09-01 – 2012-09-13 (×18): 15 mL via OROMUCOSAL
  Filled 2012-09-01 (×22): qty 15

## 2012-09-01 NOTE — Plan of Care (Signed)
Problem: Diagnosis - Type of Surgery Goal: General Surgical Patient Education (See Patient Education module for education specifics) Outcome: Completed/Met Date Met:  09/01/12 Spoke with pt's sons and daughter in laws with pt

## 2012-09-01 NOTE — Progress Notes (Signed)
ENT Progress Note: POD #1  s/p Procedure(s): LARYNGECTOMY RADICAL NECK DISSECTION   Subjective: C/O pain  Objective: Vital signs in last 24 hours: Temp:  [98.1 F (36.7 C)-98.3 F (36.8 C)] 98.3 F (36.8 C) (09/19 0802) Pulse Rate:  [56-81] 56  (09/19 0900) Resp:  [18-40] 18  (09/19 0900) BP: (97-164)/(41-117) 116/46 mmHg (09/19 0900) SpO2:  [96 %-100 %] 100 % (09/19 0900) Arterial Line BP: (115-176)/(41-66) 131/46 mmHg (09/19 0900) FiO2 (%):  [28 %] 28 % (09/19 0900) Weight:  [53 kg (116 lb 13.5 oz)] 53 kg (116 lb 13.5 oz) (09/18 1900) Weight change:     Intake/Output from previous day: 09/18 0701 - 09/19 0700 In: 4393.8 [I.V.:3113.8; Blood:700; IV Piggyback:300] Out: 2060 [Urine:1660; Drains:100; Blood:300] Intake/Output this shift: Total I/O In: 150 [I.V.:150] Out: 175 [Urine:150; Drains:25]  Labs:  [facility] 09/01/12 0410  WBC 8.6  HGB 10.3*  HCT 30.6*  PLT 282    Basename 09/01/12 0410  NA 137  K 4.4  CL 102  CO2 23  GLUCOSE 143*  BUN 36*  CALCIUM 9.2    Studies/Results: Nm Pet Image Initial (pi) Skull Base To Thigh  08/30/2012  *RADIOLOGY REPORT*  Clinical Data: Initial treatment strategy for head and neck. 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 CT data was obtained and used for attenuation correction and anatomic localization only.  (This was not acquired as a diagnostic CT examination.) Additional exam technical data entered on technologist worksheet.  Comparison:  Neck CT 08/08/2012  Findings:  Neck: There is an  intensely hypermetabolic mass centered within the posterior right aspect of the glottis with SUV max = [id number]  Hypermetabolic enlarged right level II lymph node measuring 18 mm (image 27) with SUV max = 7.5 consistent with local nodal metastasis.  No additional evidence of metastatic disease in the neck.   There is intense activity associated with tracheostomy site related to inflammation from recent surgery.  Chest:  Within the left chest wall,  there is focal intense hypermetabolic active associated with two adjacent fractures within the left lateral fifth and sixth ribs (images 65 and 74 respectively).  There is mild callus formation at the fracture sites.  No hypermetabolic mediastinal lymph nodes.  There is small focus of atelectasis at the right lung base (image 89) with associated metabolic activity which is likely inflammatory or infectious.  No suspicious pulmonary nodules.  Abdomen/Pelvis:  No abnormal hypermetabolic activity within the liver, pancreas, adrenal glands, or spleen.  No hypermetabolic lymph nodes in the abdomen or pelvis.  Skeleton:  No focal hypermetabolic activity to suggest skeletal metastasis. Left rib fractures as above.  IMPRESSION:  1.  Intensely hypermetabolic primary carcinoma within the posterior right glottis. 2.  Local nodal metastasis to the right level II station. 3.  Intense uptake in adjacent left lateral ribs fractures are in a traumatic pattern.  Cannot completely exclude pathologic fractures although not favored.  Recommend correlation with trauma. 4.  No evidence of systemic disease otherwise.  5.  Mild metabolic activity associated with right lower lobe atelectasis is likely infectious or inflammatory.   Original Report Authenticated By: Suzy Bouchard, M.D.    Dg Chest Port 1 View  09/01/2012  *RADIOLOGY REPORT*  Clinical Data: Follow-up tracheostomy placement  PORTABLE CHEST - 1 VIEW  Comparison: 08/31/2012  Findings: Surgical clips project over the neck.  Tracheostomy tube not visualized. Mild bibasilar opacity.  Heart size upper normal. Trace left pleural effusion not excluded.  No pneumothorax.  No acute osseous finding.  IMPRESSION: Mild bibasilar opacities; atelectasis versus infiltrate.   Original Report Authenticated By: Suanne Marker, M.D.    Dg Chest  Port 1 View  08/31/2012  *RADIOLOGY REPORT*  Clinical Data: Post laryngectomy.  PORTABLE CHEST - 1 VIEW  Comparison: 08/26/2012.  Findings: Biapical pleural thickening without associate bony destruction noted on the preoperative exam.  Heart size top normal.  Mild central pulmonary vascular prominence without pulmonary edema.  No pneumothorax or segmental infiltrate.  IMPRESSION: No acute abnormality.   Original Report Authenticated By: Doug Sou, M.D.      PHYSICAL EXAM: Inc intact, Neck flaps flat, JP output good stoma stable/patent Chest clear   Assessment/Plan: Stable post op Monitor closely Begin TF as tol OOB to chair today    [place], Cadi Rhinehart 09/01/2012, 10:24 AM

## 2012-09-01 NOTE — Progress Notes (Signed)
INITIAL ADULT NUTRITION ASSESSMENT Date: 09/01/2012   Time: 10:07 AM  Reason for Assessment: MD Consult for TF initiation and management  INTERVENTION:  Start Osmolite 1.2 at 25 ml/h, increase by 10 ml every 4 hours to goal rate of 65 ml/h to provide 1872 kcals, 87 grams protein, 1279 ml free water daily.  When IVF discontinued, recommend free water flushes 150 ml QID.  DOCUMENTATION CODES Per approved criteria -Severe malnutrition in the context of acute illness or injury   ASSESSMENT: Female 66 y.o.  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 Hypercholesteremia     takes Pravastatin daily  . Uterine cancer   . COPD (chronic obstructive pulmonary disease) 08/10/2012  . Hiatal hernia 08/10/2012  . 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 Hypertension     takes Tribenzor and Atenolol daily  . Stroke 2011  . Headache   . GERD (gastroesophageal reflux disease)     takes Zantac daily  . Chronic back pain   . Constipation     related to pain meds  . Nausea     takes Zofran prn  . Anxiety     takes Ativan prn  . Depression   . Insomnia     takes Amitriptyline daily  . Broken ribs     Past Surgical History  Procedure Date  . Abdominal surgery     r/t uterine carcinoma  . Tracheostomy tube placement 08/10/2012    Procedure: TRACHEOSTOMY;  Surgeon: Jerrell Belfast, MD;  Location: [facility];  Service: ENT;  Laterality: N/A;  . Laryngoscopy 08/10/2012    Procedure: LARYNGOSCOPY;  Surgeon: Jerrell Belfast, MD;  Location: [facility];  Service: ENT;  Laterality: N/A;  with biopsy  . Appendectomy   . Hernia repair   . Gastrostomy w/ feeding tube     placed last week    Related Meds:  Scheduled Meds:   . acetaminophen      . amitriptyline  10 mg Oral QHS  . irbesartan  150 mg Per Tube Daily   And  . amLODipine  2.5 mg Per Tube Daily     And  . hydrochlorothiazide  12.5 mg Per Tube Daily  . antiseptic oral rinse  15 mL Mouth Rinse q12n4p  . atenolol  12.5 mg Per Tube BID  . bacitracin  1 application Topical [id number]  . chlorhexidine  15 mL Mouth Rinse BID  . clindamycin (CLEOCIN) IV  600 mg Intravenous Q8H  . fentaNYL  12.5 mcg Transdermal Q72H  . HYDROmorphone      . midazolam      . midazolam  2 mg Intravenous Once  . mirtazapine  15 mg Oral QHS  . neomycin-bacitracin-polymyxin  1 application Topical BID  . nicotine  21 mg Transdermal Daily  . ranitidine  150 mg Per Tube Daily  . simvastatin  20 mg Oral q1800  . DISCONTD: clindamycin (CLEOCIN) IV  600 mg Intravenous Once  . DISCONTD: dexmedetomidine  0.4-1.2 mcg/kg/hr Intravenous To PACU  . DISCONTD: dexmedetomidine  1 mcg/kg Intravenous Once  . DISCONTD: Olmesartan-Amlodipine-HCTZ  0.5 tablet Per Tube Daily   Continuous Infusions:   . dextrose 5 % and 0.45 % NaCl with KCl 20 mEq/L 75 mL/hr at 08/31/12 1817  . DISCONTD: lactated ringers 20 mL/hr at 08/31/12 0938   PRN Meds:.acetaminophen (TYLENOL) oral liquid 160 mg/5 mL, acetaminophen, albuterol,  HYDROcodone-acetaminophen, LORazepam, morphine injection, ondansetron (ZOFRAN) IV, ondansetron, DISCONTD: 0.9 % irrigation (POUR BTL), DISCONTD: bacitracin, DISCONTD:  HYDROmorphone (DILAUDID) injection, DISCONTD: mupirocin cream, DISCONTD: ondansetron, DISCONTD: oxyCODONE, DISCONTD: oxyCODONE   Ht: 5' (152.4 cm)  Wt: 116 lb 13.5 oz (53 kg)        108.8 lb one week PTA (49.5 kg)  Ideal Wt: 45.5 kg  % Ideal Wt: 116%  Wt Readings from Last 10 Encounters:  08/31/12 116 lb 13.5 oz (53 kg)  08/31/12 116 lb 13.5 oz (53 kg)  08/14/12 112 lb 7 oz (51 kg)  08/14/12 112 lb 7 oz (51 kg)    Usual Wt: 125 lb % Usual Wt: 87%  Body mass index is 22.82 kg/(m^2).  Food/Nutrition Related Hx: Weight loss of ~20-30 lb in the past few months per discussion with family and physician.   Patient has been receiving TF via PEG for  the past 2-3 weeks; was receiving Osmolite 1.2 continuous feedings x 24 hours/day, recently increased rate to 65 ml/h.  Labs:  CMP     Component Value Date/Time   NA 137 09/01/2012 0410   K 4.4 09/01/2012 0410   CL 102 09/01/2012 0410   CO2 23 09/01/2012 0410   GLUCOSE 143* 09/01/2012 0410   BUN 36* 09/01/2012 0410   CREATININE 1.45* 09/01/2012 0410   CALCIUM 9.2 09/01/2012 0410   PROT 6.1 08/10/2012 0303   ALBUMIN 3.2* 08/10/2012 0303   AST 16 08/10/2012 0303   ALT 9 08/10/2012 0303   ALKPHOS 82 08/10/2012 0303   BILITOT 0.3 08/10/2012 0303   GFRNONAA 37* 09/01/2012 0410   GFRAA 42* 09/01/2012 0410    CBG (last 3)   Basename 08/30/12 1316  GLUCAP 106*    Sodium  Date/Time Value Range Status  09/01/2012  4:10 AM 137  135 - 145 mEq/L Final  08/21/2012  4:07 AM 138  135 - 145 mEq/L Final  08/18/2012  3:25 AM 139  135 - 145 mEq/L Final    Potassium  Date/Time Value Range Status  09/01/2012  4:10 AM 4.4  3.5 - 5.1 mEq/L Final  08/21/2012  4:07 AM 3.8  3.5 - 5.1 mEq/L Final  08/18/2012  3:25 AM 3.4* 3.5 - 5.1 mEq/L Final     Intake/Output Summary (Last 24 hours) at 09/01/12 1031 Last data filed at 09/01/12 0900  Gross per 24 hour  Intake 4543.75 ml  Output   2235 ml  Net 2308.75 ml     Diet Order:   NPO; RD received consult to begin TF via PEG   IVF:    dextrose 5 % and 0.45 % NaCl with KCl 20 mEq/L Last Rate: 75 mL/hr at 08/31/12 1817  DISCONTD: lactated ringers Last Rate: 20 mL/hr at 08/31/12 [id number]    Estimated Nutritional Needs:   Kcal: 1650-1850 Protein: 80-100 grams Fluid: 1.7-1.9 liters  Pt meets criteria for severe malnutrition in the context of acute illness as evidenced by 13% weight loss in the past 1-3 months and moderate loss of subcutaneous body fat and muscle mass.    Patient's daughter-in-law reports that patient has been eating very small amounts of pureed foods and liquids.  TF has been gradually increased to provide adequate calories and protein to prevent  further weight loss.  Weight had gone from 112 lb to 108 lb in one week.  Home TF regimen of Osmolite 1.2 at 65 ml/h provides 1872 kcals, 87 grams protein, 1279 ml free water daily.  NUTRITION DIAGNOSIS: -Malnutrition (NI-5.2).  Status: Ongoing  RELATED TO: difficulty swallowing due to neck mass  AS EVIDENCED BY: 13% weight loss in the past 1-3 months and moderate loss of subcutaneous body fat and muscle mass  MONITORING/EVALUATION(Goals): Goal:  Intake to meet >90% of estimated nutrition needs. Monitor:  TF tolerance/adequacy, labs, weight trend.  EDUCATION NEEDS: -Education not appropriate at this time   Molli Barrows, RD, LDN, [place] Pager# [phone] After Hours Pager# [phone] 09/01/2012, 10:07 AM

## 2012-09-01 NOTE — Progress Notes (Signed)
Hold beta blocker for SBP <110 and/or HR <65.

## 2012-09-01 NOTE — Progress Notes (Signed)
Name: Zariel Croan MRN: [id number] DOB: Dec 21, 1945    LOS: 1  [place] [facility] / Critical Care Note   History of Present Illness:  66 y/o spanish speaking F with recent admit 8/26-9/9 with rib fx's, stridor and left chest pain work up demonstrated a soft tissue mass in larynx above vocal cords, underwent biopsy and was diagnosed with supraglottic carcinoma (T4N1) s/p tracheostomy [name]) and PEG placement per IR.  [name]) admitted on 9/18 for elective total laryngectomy, required 2 units PRBC's in OR. Followed by Dr. Humphrey Rolls for Oncology.  PCCM consulted for post-op ICU medical mgmt.    Lines / Drains: Trach 8/28>>> Neck Drains 9/18>>>  Cultures: None  Antibiotics: Clinda (post-op) 9/18>>>  Subjective: Patient is resting bed with her daughter at bedside. Patient does not speak english, but her daughter provided translation. Patient denies any pain or discomfort, other than her expected neck pain post surgery.   Objective: Vital Signs: Temp:  [98.1 F (36.7 C)-98.4 F (36.9 C)] 98.4 F (36.9 C) (09/19 1138) Pulse Rate:  [56-81] 59  (09/19 1300) Resp:  [18-40] 20  (09/19 1300) BP: (97-164)/(41-117) 113/51 mmHg (09/19 1300) SpO2:  [96 %-100 %] 98 % (09/19 1300) Arterial Line BP: (115-176)/(41-66) 137/60 mmHg (09/19 1100) FiO2 (%):  [28 %] 28 % (09/19 1300) Weight:  [53 kg (116 lb 13.5 oz)] 53 kg (116 lb 13.5 oz) (09/18 1900) I/O last 3 completed shifts: In: 4393.8 [I.V.:3113.8; Blood:700; Other:280; IV [id number] Out: 2060 [Urine:1660; Drains:100; Blood:300]  Physical Examination: General: chronically ill in NAD HEENT: staples, drains intact, dressing c/d/i Neuro: sedate, arouses but drifts back to sleep CV: s1s2 rrr, no m/r/g PULM: resp's even/non-labored, lungs bilaterally clear GI: flat, soft, bsx4 active Extremities: warm/dry, no edema   Assessment and Plan: Active Problems:  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  Pulmonary A: 9/18  CXR>>>No active disease. Stable tracheostomy tube position. 9/19 CXR>>> Mild bibasilar opacities; atelectasis versus infiltrate, chronic int changes - SCC of Supraglottis: T4N1 SCC s/p tracheostomy [name]) admitted on 9/18 for elective total laryngectomy - Tobacco Abuse - Presumed COPD - 3 wound drains in place with minimal continued blood draining P: -O2 to keep sats >92% -post-op surgical care per Dr. [name] -follow up with Oncology as outpatient -nicotine patch -PRN BD's -pcxr reviewed, chronic int changes  Cardiovascular A: HTN, h/o CVA HLD P: -hold plavix (hx of CVA) -cont zocor, atenolol -dc arb, hctz with rise crt , ARF If BP rises, add hydralazine as some brady Control pain  GI A: Protein Calorie Malnutrition Status Post PEG GERD  P: -nutrition consult -TF per PEG -zantac  Renal BMET    Component Value Date/Time   NA 137 09/01/2012 0410   K 4.4 09/01/2012 0410   CL 102 09/01/2012 0410   CO2 23 09/01/2012 0410   GLUCOSE 143* 09/01/2012 0410   BUN 36* 09/01/2012 0410   CREATININE 1.45* 09/01/2012 0410   CALCIUM 9.2 09/01/2012 0410   GFRNONAA 37* 09/01/2012 0410   GFRAA 42* 09/01/2012 0410  A: GFR 37, likely 2/2 to hypovolemia, BUN, ARF, likely ATN P: Maintain fluids at 75 Chem in am  Dc ARB, HCTZ  Hematology CBC    Component Value Date/Time   WBC 8.6 09/01/2012 0410   RBC 3.72* 09/01/2012 0410   HGB 10.3* 09/01/2012 0410   HCT 30.6* 09/01/2012 0410   PLT 282 09/01/2012 0410   MCV 82.3 09/01/2012 0410   MCH 27.7 09/01/2012 0410   MCHC 33.7 09/01/2012 0410   RDW  13.4 09/01/2012 0410   LYMPHSABS 2.3 08/08/2012 1840   MONOABS 0.8 08/08/2012 1840   EOSABS 0.2 08/08/2012 1840   BASOSABS 0.1 08/08/2012 1840  A: No active bleed Pt is s/p surgery with 3 wound drains in place P: Cbc in am dvt prevention, scd When OK by surgery and crt resolves- in setting cancer, consider lovenox  Endocrine A: No h/o DM Hyperglycemia - 143 P: Continue to follow  bmet Bmet in am  Infectious  A: WBC wnl, afebrile No active diease P: none  Neurological A: -Depression / Anxiety -Acute on Chronic Pain  -on high dose chronic narcotics at home.  Pain control may be an issue.  P: -continue home medications ([place]) -post op pain control per surgery -mobilize  BEST PRACTICE / DISPOSITION  - Level of Care: ICU x 24 hours, consider dc sdu in am  - Primary Service: PCCM  - Consultants: ENT - Code Status: Full code - Diet: PEG tube - DVT Px: SCDs  - GI Px: Zantac - Skin Integrity: No rash/erythema   Warner Mccreedy, Medical Student Pulmonary/Critical Care 09/01/2012, 1:16 PM  I have fully examined this patient and agree with above findings.    And edited in full  Lavon Paganini. Titus Mould, MD, [place] Pgr: [facility]

## 2012-09-01 NOTE — Op Note (Signed)
[name], [name]              ACCOUNT NO.:  [id number]  MEDICAL RECORD NO.:  [id number]  LOCATION:  2106                         FACILITY:  [facility]  PHYSICIAN:  Early Chars. 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  SURGICAL PROCEDURE: 1. Total laryngectomy 2. Bilateral neck dissections.  ANESTHESIA:  General endotracheal.  SURGEON:  Early Chars. [name], MD  ASSISTANT:  Ruby Cola, MD  ESTIMATED BLOOD LOSS:  Approximately 200 mL.  The patient transferred from the operating room to the recovery room in stable condition.  No complications.  BRIEF HISTORY:  The patient is a 66 year old female, who presented to the [facility] Emergency Department approximately 3 weeks ago after suffering acute chest pain.  She was found to have rib fractures that she sustained during a fall.  As part of the emergency room evaluation, the patient was noted to have stridorous breathing and a CT scan of her neck and chest was performed.  This showed a large invasive mass consistent with squamous cell carcinoma and significant airway narrowing.  The ENT Service, Dr. [name], was consulted for evaluation of the patient's airway.  She was found to have a large supraglottic tumor with airway compromise.  She underwent emergency tracheostomy at [facility] and direct laryngoscopy and biopsy.  Biopsy consistent with squamous cell carcinoma.  She was staged after CT and MRI scans as a T4 N1 supraglottic laryngeal carcinoma.  With that staging and clinical history, we recommended total laryngectomy and bilateral neck dissections.   The risks and benefits of these procedures were discussed in detail with the patient and her family.  They understood and concurred with our plan for surgery which was scheduled on an elective basis at [facility] with expected admission and observation for 7-10 days postoperatively.  SURGICAL PROCEDURE:  The patient was brought to the operating room at [facility] on August 31, 2012 and placed in supine position on the operating table.  General endotracheal anesthesia was established via the patient's existing tracheostomy. When she was adequately anesthetized, she was positioned on the operating table, prepped and draped in sterile fashion.  Foley catheter was placed.  An arterial line for monitoring of blood pressure and heart rate were performed.  The patient was prepared for surgery.  She was positioned and surgical procedure was begun.  Prior to surgery, a standard time-out was performed.  Surgical procedure was begun by creating a U-shaped vertically oriented incision extending from the right postauricular region across the anterior neck incorporating the patient's previous tracheostomy extending to the left side, ending at the postauricular region on the left.  This U shaped apron flap was then elevated in a subplatysmal fashion superiorly as well as inferiorly to expose the entire anterior neck.  The patient's previously placed tracheostomy was then carefully dissected, preserving a cuff of tissue around the  entire trach site which was incorporated with the patient's pathologic specimen. Tracheostomy tube was removed and a anode laryngotracheal tube was placed and was used throughout the surgical procedure.  With subplatysmal flaps elevated superiorly and inferiorly, the right neck was evaluated.  The patient had a 2 x 3 cm palpable mass in the superior aspect of the right lateral neck consistent with metastatic disease. Given her history and findings, we  anticipated a selective neck dissection, consisting of zones 1 through 4.  Dissection was carried out along the anterior aspect of the right sternocleidomastoid muscle, elevating the fascia and lymph node-bearing tissue anteriorly and then deep to the SCM, which was lateralized. Dissection was then carried out along the superior component of the sternocleidomastoid muscle, identifying and preserving the spinal accessory nerve throughout its course.  The posterior aspect of the digastric muscle was elevated superiorly and the spinal accessory nerve and jugular vein were identified.  The spinal accessory nerve was then traced distally to the sternocleidomastoid and trapezius muscles.  Zone 2B was then carefully dissected, preserving the spinal accessory nerve and incorporated with the neck dissection specimen.  Dissection was then carried anteriorly along the neck parallel to the mandible, dissecting the submandibular space including submandibular gland and surrounding lymph nodes inferiorly.  The lingual nerve and hypoglossal nerves were identified and preserved along the anterior aspect of the neck dissection between the strap muscles.  Soft tissue was elevated off the straps posteriorly and incorporated with the pathologic specimen.  The inferior aspect of the neck dissection was then carried out, extending along the sternocleidomastoid muscle inferiorly and dissecting throughout zone 4. The omohyoid muscle was identified and divided.  Dissection then swept anteriorly to the level of the jugular vein.  The lymph node-bearing tissue was then reflected anteriorly and using a #10 scalpel, the jugular vein was skeletonized.  Its contributing vessels were divided and suture ligated.  The carotid artery was identified and preserved as was the vagus nerve.  The dissection was then carried out along the anterior compartment of the neck.  The thyroid isthmus was divided and the right thyroid lobe  was reflected laterally and preserved along its inferior and superior pedicles.  The neck dissection specimen was then removed and sent to pathology for gross microscopic evaluation.  Attention was then turned to the patient's left-hand side where a neck dissection was then carried out again in a similar fashion with dissection along the anterior aspect of the sternocleidomastoid muscle, the fiber fatty tissue and lymph node-bearing tissue was elevated anteriorly.  The spinal accessory nerve was identified and preserved throughout its course.  Deep to the spinal accessory nerve zone 2B, lymph node-bearing fiber fatty tissue was dissected and carefully dissected free of the spinal accessory nerve and the superior aspect of the jugular vein.  Dissection was carried posteriorly from superior to inferior dissecting zones 2, 3, and 4. Omohyoid muscle was divided and dissection was carried deep to this area, identifying the cervical contributions to the phrenic nerve and the brachial plexus.  Fatty lymph node-bearing tissue was extended anteriorly.  Jugular vein was identified.  Vagus nerve and carotid artery were identified and preserved.  The lymph node tissue was resected from the jugular vein. Its contributing vessels were identified, divided, and suture ligated. Dissection was carried anteriorly.  Zone 1 was spared as this did not appear to be involved with tumor and was on the contralateral side.  The entire dissection was then carried to the anterior margin, resected,  and sent to pathology for gross microscopic evaluation.  The left neck dissection contains zones 2, 3, and 4.  With neck dissections complete, the primary resection was then undertaken.  The thyroid lobes were lateralized and internal tunnels were developed along the strap muscles adjacent to the larynx.  Strap muscle was divided inferiorly and dissection was then carried superiorly to the level of the hyoid bone. The  suprahyoid musculature was divided with Bovie electrocautery.  The larynx was then rotated laterally and the inferior constrictor muscles were divided with the cautery on the left-hand side, which was contralateral to the tumor.  The piriform mucosa was elevated from the medial side of the thyroid cartilage and preserved on the right-hand side which was the tumor side.  The mucosal dissection was not undertaken in order to ensure oncologic removal of a cancer.  Inferiorly the patient's stoma was dissected and the trachea was beveled from anterior to posterior.  This was sutured to the anterior skin flap and then dissection was carried out between the trachea and esophagus, elevating from inferior to superior to the level of the arytenoid cartilages.  Attention was then turned to the superior component with dissection and along the hyoid bone.  The musculature was divided.  The hyoid bone was lifted and the hyoepiglottic ligament was identified and followed from superficial to deep to the level of the vallecula.  The tip of the epiglottis was identified and reflected anteriorly to allow direct inspection of the larynx and tumor.  The tumor occupied the entire laryngeal surface of the epiglottis and extended on the right- hand side to the level of the area of epiglottic fold.  With the soft tissue mucosa elevated, curved scissors were then used to resect the laryngeal mucosa, preserving as much of the pharynx as possible. Dissection was then carried inferiorly below the level of the cricoid cartilage and the entire larynx was removed.  It was carefully inspected and there was no evidence of tumor adjacent to the mucosal margins.  The closest margin appeared to be the right lateral piriform mucosa and this was inspected and then a 5 mm margin from that area was retaken and sent to pathology for frozen section analysis.  This was negative for any additional tumor.  The patient's neck and  primary dissection was then irrigated.  Hemostasis was obtained with a combination of cautery, suture ligature, and bipolar cautery.  Using a 3-0 Vicryl suture, the pharynx was then carefully reapproximated.  This consisted of a running locked imbricating suture to invert the pharyngeal mucosa and reduce the risk of a fistula.  Superiorly and inferiorly, the suture ends were tagged and a buttonhole suture was used to invert the suture line.  The neck was then irrigated second time.  There was minimal bleeding.  The peristomal sutures were then undertaken.  This consisted of 3-0 interrupted Vicryl suture in a vertical mattressing fashion to close the trachea stoma to the skin along the inferior aspect of the flap. Superior flap was then replaced and the stomal sutures were undertaken along the superior component as well.  Prior to closure, a three 10 mm Blake drains were placed, one in each side of the neck and one overlying the pharyngeal closure.  The neck incision was then closed in multiple layers beginning with interrupted 3-0 Vicryl suture to reapproximate the platysmal layer.  The submucosal layer was closed with similar stitch and final skin edge was closed with surgical staples.  The patient's wound was  then cleansed and dressed with bacitracin ointment.  She was awakened from anesthetic and then transferred from the operating room to the recovery room in stable condition.  There were no complications. Estimated blood loss was approximately 200 mL.          ______________________________ Early Chars. [name], M.D.     DLS/MEDQ  D:  [id number]  T:  09/01/2012  Job:  [id number]  cc:   [place]

## 2012-09-02 LAB — CBC WITH DIFFERENTIAL/PLATELET
Basophils Absolute: 0 10*3/uL (ref 0.0–0.1)
Basophils Relative: 0 % (ref 0–1)
Eosinophils Absolute: 0.1 10*3/uL (ref 0.0–0.7)
Eosinophils Relative: 1 % (ref 0–5)
Lymphs Abs: 1.4 10*3/uL (ref 0.7–4.0)
MCH: 27.6 pg (ref 26.0–34.0)
MCHC: 32.1 g/dL (ref 30.0–36.0)
MCV: 86.1 fL (ref 78.0–100.0)
Platelets: 289 10*3/uL (ref 150–400)
RDW: 13.8 % (ref 11.5–15.5)

## 2012-09-02 LAB — BASIC METABOLIC PANEL
Calcium: 9.3 mg/dL (ref 8.4–10.5)
GFR calc non Af Amer: 51 mL/min — ABNORMAL LOW (ref >90)
Glucose, Bld: 146 mg/dL — ABNORMAL HIGH (ref 70–99)
Sodium: 139 mEq/L (ref 135–145)

## 2012-09-02 LAB — GLUCOSE, CAPILLARY
Glucose-Capillary: 116 mg/dL — ABNORMAL HIGH (ref 70–99)
Glucose-Capillary: 148 mg/dL — ABNORMAL HIGH (ref 70–99)
Glucose-Capillary: 148 mg/dL — ABNORMAL HIGH (ref 70–99)
Glucose-Capillary: 155 mg/dL — ABNORMAL HIGH (ref 70–99)

## 2012-09-02 NOTE — Evaluation (Signed)
Occupational Therapy Evaluation Patient Details Name: [name] MRN: [id number] DOB: 1946/08/08 Today's Date: 09/02/2012 Time: [id number] OT Time Calculation (min): 38 min  OT Assessment / Plan / Recommendation Clinical Impression  Pt admitted for radical larengectomy/neck surgery with trach and peg placement who presents with extreme pain in her neck as well as decreased confidense moving her neck during functional activities.  Pt would benefit from cont OT to increase I with basic neck mobility during basic adls and become I with exercises for her nect.    OT Assessment  Patient needs continued OT Services    Follow Up Recommendations  No OT follow up Recommend pt walk in halls with nursing as much as possible to maintain And increase strength and mobility.   Barriers to Discharge None    Equipment Recommendations  None recommended by OT    Recommendations for Other Services    Frequency  Min 2X/week    Precautions / Restrictions Precautions Precautions: None Precaution Comments: T*collar Restrictions Weight Bearing Restrictions: No Other Position/Activity Restrictions: watch 3 drains all from neck area.  2 on R one on L.   Pertinent Vitals/Pain Pt with 10/10 pain in neck.  Nursing aware and gave meds.    ADL  Eating/Feeding: NPO;Other (comment) (Pt with new peg tube) Grooming: Simulated;Set up Where Assessed - Grooming: Supported sitting Upper Body Bathing: Simulated;Set up Where Assessed - Upper Body Bathing: Supported sitting Lower Body Bathing: Simulated;Minimal assistance Where Assessed - Lower Body Bathing: Supported sit to stand Upper Body Dressing: Simulated;Minimal assistance Where Assessed - Upper Body Dressing: Supported sitting Lower Body Dressing: Simulated;Min guard Where Assessed - Lower Body Dressing: Supported sit to Lobbyist: Performed;Min guard Armed forces technical officer Method: Sit to stand;Stand pivot Science writer: Public house manager and Hygiene: Simulated;Minimal assistance Where Assessed - Best boy and Hygiene: Standing Transfers/Ambulation Related to ADLs: S.  Pt very anxious about moving neck therefore all mobility is very slow and deliberate. ADL Comments: Min assist only needed secondary to pain level in neck with any mobilty at all.    OT Diagnosis: Acute pain  OT Problem List: Decreased range of motion;Pain OT Treatment Interventions: Therapeutic exercise;Self-care/ADL training   OT Goals Acute Rehab OT Goals OT Goal Formulation: With patient/family Time For Goal Achievement: 09/09/12 Potential to Achieve Goals: Good Miscellaneous OT Goals Miscellaneous OT Goal #1: Pt will be independent with basic neck rotation/flexion exercise to increase ROM of nect. OT Goal: Miscellaneous Goal #1 - Progress: Goal set today OT Goal: Miscellaneous Goal #2 - Progress: Goal set today Miscellaneous OT Goal #3: Pt will attempt basic adls on her own in attempts to also increase neck ROM and gain confidence in moving neck.  Visit Information  Last OT Received On: 09/02/12 Assistance Needed: +1    Subjective Data  Subjective: I need pain medicine (translated through daugther) Patient Stated Goal: to get back to normal.   Prior Functioning  Vision/Perception  Home Living Lives With: Family;Son Available Help at Discharge: Family;Available 24 hours/day Type of Home: House Home Access: Stairs to enter [employer] of Steps: 3 Entrance Stairs-Rails: Right Home Layout: One level Bathroom Shower/Tub: Product/process development scientist: Standard Home Adaptive Equipment: Shower chair with back Additional Comments: pt  family supportive and  able to provide necessary assist Prior Function Level of Independence: Independent Able to Take Stairs?: Yes Vocation: Retired Corporate investment banker: Prefers language other than English Dominant  Hand: Right   Vision - Assessment Vision Assessment: Vision  not tested  Cognition  Overall Cognitive Status: Appears within functional limits for tasks assessed/performed Arousal/Alertness: Awake/alert Orientation Level: Appears intact for tasks assessed Behavior During Session: Anxious Cognition - Other Comments: Pt appears intack but extremely anxious with all of these recent medical changes.    Extremity/Trunk Assessment Right Upper Extremity Assessment RUE ROM/Strength/Tone: WFL for tasks assessed RUE Sensation: WFL - Light Touch RUE Coordination: WFL - gross/fine motor Left Upper Extremity Assessment LUE ROM/Strength/Tone: Deficits;Due to pain LUE ROM/Strength/Tone Deficits: left rib pain, shoulder elevation painful LUE Sensation: WFL - Light Touch LUE Coordination: WFL - gross/fine motor Trunk Assessment Trunk Assessment: Normal   Mobility  Shoulder Instructions  Bed Mobility Bed Mobility: Supine to Sit Supine to Sit: 4: Min guard Details for Bed Mobility Assistance: due to multiple lines Transfers Transfers: Sit to Stand;Stand to Sit Sit to Stand: 5: Supervision Stand to Sit: 5: Supervision Details for Transfer Assistance: due to multiple lines       Exercise Other Exercises Other Exercises: Pt instructed 3x a day to very slowly move neck up and down and side to side within pain limits to maintain ROM/strength in neck.   Balance     End of Session OT - End of Session Activity Tolerance: Patient limited by pain Patient left: in chair;with call bell/phone within reach;with family/visitor present Nurse Communication: Mobility status  GO     Glenford Peers 09/02/2012, 1:11 PM [phone]

## 2012-09-02 NOTE — Progress Notes (Signed)
Pt doing well. She is hurting after about 2 hours on the morphine. She is getting up.   Exam awake and alert. Neck excellent with drains working. Stoma open. No hematoma. Lungs clear.   Will start the lortab per tube and continue care.

## 2012-09-02 NOTE — Progress Notes (Signed)
Pt coughed up large brown and red mucous plug from stoma. Became very anxious. Rn reassured her that she was fine. VSS. Rn check stoma still had small piece that I was able to get out with tweezers. Rn will continue to monitor patient for any distress.

## 2012-09-02 NOTE — Progress Notes (Signed)
Name: [name] MRN: [id number] DOB: [birth year]    LOS: 2  [facility] / Critical Care Note   History of Present Illness:  66 y/o spanish speaking F with recent admit 8/26-9/9 with rib fx's, stridor and left chest pain work up demonstrated a soft tissue mass in larynx above vocal cords, underwent biopsy and was diagnosed with supraglottic carcinoma (T4N1) s/p tracheostomy [name]) and PEG placement per IR.  [name]) admitted on 9/18 for elective total laryngectomy, required 2 units PRBC's in OR. Followed by Dr. Humphrey Rolls for Oncology.  PCCM consulted for post-op ICU medical mgmt.    Lines / Drains: Trach 8/28>>> Neck Drains 9/18>>>  Cultures: None  Antibiotics: Clinda (post-op) 9/18>>>9/19  Subjective: Patient is resting bed with her daughter at bedside. Patient has neck pain, but no other discomfort.   Objective: Vital Signs: Temp:  [98.2 F (36.8 C)-98.6 F (37 C)] 98.3 F (36.8 C) (09/20 0744) Pulse Rate:  [54-62] 57  (09/20 0736) Resp:  [17-27] 17  (09/20 0736) BP: (97-154)/(41-58) 118/54 mmHg (09/20 0736) SpO2:  [97 %-100 %] 100 % (09/20 0736) Arterial Line BP: (119-140)/(41-60) 137/60 mmHg (09/19 1100) FiO2 (%):  [28 %] 28 % (09/20 0736) I/O last 3 completed shifts: In: 3695 [I.V.:2660; Other:985; IV Piggyback:50] Out: 3080 [Urine:2930; Drains:150]  Physical Examination: General: NAD HEENT: staples, drains intact, dressing c/d/i Neuro: drowsy, but oriented CV: s1s2 rrr, no m/r/g PULM: resp's even/non-labored, lungs bilaterally clear GI: soft, minimal bowel sounds, nontender, nondistended  Extremities: warm/dry, no edema  Assessment and Plan: Active Problems:  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  Pulmonary A: 9/18 CXR>>>No active disease. Stable tracheostomy tube position. 9/19 CXR>>> Mild bibasilar opacities; atelectasis versus infiltrate, chronic int changes - SCC of Supraglottis: T4N1 SCC s/p tracheostomy [name]) admitted  on 9/18 for elective total laryngectomy - Tobacco Abuse - Presumed COPD - 3 wound drains in place with minimal continued blood draining - chronic int changes on cxr likely 2/2 history of smoking P: -O2 to keep sats >92% -post-op surgical care per Dr. [name] of wound -nicotine patch -PRN BD's  Cardiovascular A: HTN, h/o CVA HLD Diastolic in [id number] P: -cont zocor, atenolol,  Hold norvasc If BP rises, add hydralazine as some brady Control pain Continue to hold arb, if crt wnl in am , can restart  GI A: Protein Calorie Malnutrition Status Post PEG GERD  P: -nutrition consult -TF per PEG -zantac  Renal BMET    Component Value Date/Time   NA 139 09/02/2012 0430   K 4.2 09/02/2012 0430   CL 104 09/02/2012 0430   CO2 24 09/02/2012 0430   GLUCOSE 146* 09/02/2012 0430   BUN 18 09/02/2012 0430   CREATININE 1.10 09/02/2012 0430   CALCIUM 9.3 09/02/2012 0430   GFRNONAA 51* 09/02/2012 0430   GFRAA 59* 09/02/2012 0430  A: GFR up to 51, improving renal status with fluids P: Lower fluids Arb hold x 24 hrs further, follow crt Chem in am   Hematology CBC    Component Value Date/Time   WBC 10.2 09/02/2012 0430   RBC 3.73* 09/02/2012 0430   HGB 10.3* 09/02/2012 0430   HCT 32.1* 09/02/2012 0430   PLT 289 09/02/2012 0430   MCV 86.1 09/02/2012 0430   MCH 27.6 09/02/2012 0430   MCHC 32.1 09/02/2012 0430   RDW 13.8 09/02/2012 0430   LYMPHSABS 1.4 09/02/2012 0430   MONOABS 1.0 09/02/2012 0430   EOSABS 0.1 09/02/2012 0430   BASOSABS 0.0 09/02/2012 0430  A: No  active bleed Pt is s/p surgery with 3 wound drains in place P: Limit phlebotomy dvt prevention, scd When OK by surgery and crt resolves- in setting cancer, consider lovenox, maybe in am  Endocrine A: No h/o DM Hyperglycemia - 148 P: Bmet in am  Infectious  A: WBC wnl, afebrile No active diease P: None No further abx  Neurological A: -Depression / Anxiety -Acute on Chronic Pain  -on high dose chronic narcotics  at home.  Pain control may be an issue.  P: -continue home medications ([place]) -post op pain control per surgery -mobilize -ot consulted head stability?  BEST PRACTICE / DISPOSITION  - Level of Care: consider DC to SDU or floor, will cal ent for approval - Primary Service: PCCM,will sign off to triad as medical management upon transfer - Consultants: ENT - Code Status: Full code - Diet: PEG tube - DVT Px: SCDs  - GI Px: Zantac - Skin Integrity: No rash/erythema   Warner Mccreedy, Medical Student Pulmonary/Critical Care 09/02/2012, 7:56 AM  I have fully examined this patient and agree with above findings.    And edite dinfulll  Lavon Paganini. Titus Mould, MD, [place] Pgr: [facility]

## 2012-09-02 NOTE — Progress Notes (Signed)
Report called to Lauren RN.  Transported by RN to [place] in wheelchair on monitor with O2.  VSS. No distress noted. Daughter accompanied Pt. To new room.

## 2012-09-03 DIAGNOSIS — G894 Chronic pain syndrome: Secondary | ICD-10-CM

## 2012-09-03 LAB — BASIC METABOLIC PANEL
BUN: 17 mg/dL (ref 6–23)
Calcium: 9.4 mg/dL (ref 8.4–10.5)
Creatinine, Ser: 0.97 mg/dL (ref 0.50–1.10)
GFR calc Af Amer: 69 mL/min — ABNORMAL LOW (ref >90)
GFR calc non Af Amer: 60 mL/min — ABNORMAL LOW (ref >90)

## 2012-09-03 LAB — GLUCOSE, CAPILLARY: Glucose-Capillary: 125 mg/dL — ABNORMAL HIGH (ref 70–99)

## 2012-09-03 MED ORDER — DOCUSATE SODIUM 50 MG/5ML PO LIQD
100.0000 mg | Freq: Two times a day (BID) | ORAL | Status: DC
  Administered 2012-09-03 – 2012-09-06 (×4): 100 mg
  Filled 2012-09-03 (×24): qty 10

## 2012-09-03 MED ORDER — FENTANYL 25 MCG/HR TD PT72
25.0000 ug | MEDICATED_PATCH | TRANSDERMAL | Status: DC
  Administered 2012-09-03 – 2012-09-06 (×2): 25 ug via TRANSDERMAL
  Filled 2012-09-03 (×2): qty 1

## 2012-09-03 MED ORDER — OSMOLITE 1.2 CAL PO LIQD
237.0000 mL | Freq: Four times a day (QID) | ORAL | Status: DC
  Administered 2012-09-04 – 2012-09-05 (×7): 237 mL
  Filled 2012-09-03 (×12): qty 237

## 2012-09-03 MED ORDER — AMLODIPINE 1 MG/ML ORAL SUSPENSION
2.5000 mg | Freq: Every day | ORAL | Status: DC
  Administered 2012-09-03: 2.5 mg
  Filled 2012-09-03 (×2): qty 2.5

## 2012-09-03 MED ORDER — SENNOSIDES 8.8 MG/5ML PO SYRP
5.0000 mL | ORAL_SOLUTION | Freq: Two times a day (BID) | ORAL | Status: DC
  Administered 2012-09-03 – 2012-09-06 (×4): 5 mL
  Filled 2012-09-03 (×24): qty 5

## 2012-09-03 MED ORDER — OSMOLITE 1.2 CAL PO LIQD
1000.0000 mL | ORAL | Status: DC
  Filled 2012-09-03 (×2): qty 1000

## 2012-09-03 MED ORDER — OSMOLITE 1.2 CAL PO LIQD
250.0000 mL | Freq: Four times a day (QID) | ORAL | Status: DC
  Administered 2012-09-03 (×2): 250 mL
  Filled 2012-09-03 (×5): qty 474

## 2012-09-03 NOTE — Progress Notes (Signed)
[facility] PROGRESS NOTE  [name] [id number] DOB: 07/10/1946 DOA: 08/31/2012 PCP: [name], MD  Assessment/Plan: Acute on chronic pain syndrome -Patient has received 18 mg IV morphine, 22.5 mg hydrocodone in 24 hours -Increase fentanyl patch to 25 mcg -Continue intermittent breakthrough medications -Remeron and Elavil at bedtime Hypertension -Restart amlodipine -Continue atenolol -Restart ARB if renal function remained stable Constipation -Start cathartics Malnutrition -Check pre-albumin -Continue tube feeds Osmolite at 65 cc an hour -Patient is tolerating tube feeds at this time Hyperlipidemia -Continue Zocor Tobacco use, presumptive COPD -Intermittent bronchodilators -Continue Nicoderm patch    Family Communication:  Son and daughter at beside Disposition Plan:   Home when medically stable    Procedures/Studies: Dg Chest 2 View  08/26/2012  *RADIOLOGY REPORT*  Clinical Data: Preop for laryngectomy  CHEST - 2 VIEW  Comparison: 08/10/2012  Findings: Cardiomediastinal silhouette is stable.  Tracheostomy tube is unchanged in position.  No acute infiltrate or pleural effusion.  No pulmonary edema. Bony thorax is unremarkable.  IMPRESSION:  No active disease.  Stable tracheostomy tube position.   Original Report Authenticated By: Lahoma Crocker, M.D.    Ct Soft Tissue Neck W Contrast  08/08/2012  *RADIOLOGY REPORT*  Clinical Data: Laryngeal mass partially imaged on a chest CTA earlier today.  CT NECK WITH CONTRAST  Technique:  Multidetector CT imaging of the neck was performed with intravenous contrast.  Contrast: 56mL OMNIPAQUE IOHEXOL 300 MG/ML  SOLN  Comparison: Chest CTA obtained earlier today.  Findings: Again demonstrated is a large mass filling and expanding the larynx and hypopharynx above the level of the true cords.  This is involving the epiglottis and aryepiglottic folds and extending into the vallecula and piriform sinuses, greater on the right. This has  a fluid density component on the left and is causing marked airway narrowing.  This mass measures 3.8 x 2.6 cm in maximum dimensions on image number 36 of series 8.  On coronal image number 49 of series 9, this measures 3.2 cm in length.  Also demonstrated is a bilobed or two adjacent lymph nodes with central low density in the lateral neck on the right, anterior to the sternocleidomastoid muscle.  This measures 1.8 x 1.7 cm in maximum dimensions on image number 55 of series 2 and 2.7 cm in length on coronal image number 46 of series 3.  No additional enlarged lymph nodes are identified.  Mild bullous changes are demonstrated at the lung apices with biapical pleural and parenchymal scarring.  The thyroid gland is unremarkable.  Cervical spine degenerative changes.  IMPRESSION:  1.  3.8 x 3.2 x 2.6 cm laryngeal mass above the level of the true cords, involving the aryepiglottic folds, epiglottis, piriform sinuses and vallecula.  This is necrotic on the left and is causing marked airway narrowing.  This is most compatible with a primary squamous cell carcinoma. 2.  Necrotic metastatic adenopathy on the right, compatible with metastatic squamous cell carcinoma. 3.  COPD.   Original Report Authenticated By: Gerald Stabs, M.D.    Ct Angio Chest Pe W/cm &/or Wo Cm  08/08/2012  *RADIOLOGY REPORT*  Clinical Data: Shortness of breath.  Wheezing.  CT ANGIOGRAPHY CHEST  Technique:  Multidetector CT imaging of the chest using the standard protocol during bolus administration of intravenous contrast. Multiplanar reconstructed images including MIPs were obtained and reviewed to evaluate the vascular anatomy.  Contrast:  79mL OMNIPAQUE IOHEXOL 350 MG/ML SOLN  Comparison: Chest x-ray dated 08/01/2012  Findings: There is a displaced  fracture of the lateral aspect of the left sixth rib and a nondisplaced fracture of the lateral aspect of the left fifth rib. There is also a fracture of the anterior lateral aspect of the left  seventh rib.  There is a 4.5 mm diameter cylindrical density in the lateral aspect of the right middle lobe, unchanged since prior CT scan dated 05/02/2003.  This could be a chronically impacted bronchus. This is not felt to be significant.  There is abnormal soft tissue prominence in the larynx just above the true cords asymmetric to the right with marked transverse narrowing of the airway.  This could represent a mass.  The area is incompletely visualized on the chest CT.  No visible adenopathy.  Heart size is normal. No infiltrates or effusions.  No pulmonary emboli.  Moderate hiatal hernia.  IMPRESSION:  1.  There appears to be a soft tissue mass in the larynx just above the true cords compressing the airway. 2.  Fractures of the lateral aspects of the left fifth, sixth, and seventh ribs. There may be a fracture of the anterior lateral aspect of the left eighth rib as well. 3.  No pulmonary emboli.   Original Report Authenticated By: Larey Seat, M.D.    Ir Gastrostomy Tube Mod Sed  08/17/2012  *RADIOLOGY REPORT*  PULL THROUGH GASTROSTOMY TUBE PLACEMENT UNDER FLUOROSCOPIC GUIDANCE  Date: 08/17/2012  Clinical History: 66 year old female with laryngeal cancer and dysphagia.  Percutaneous gastrostomy tube required for parenteral feeding during upcoming chemo and radiation therapy.  Procedures Performed:  1.  Fluoroscopically guided placement of percutaneous pull-through gastrostomy tube.  Interventional Radiologist:  Criselda Peaches, MD  Sedation: Moderate (conscious) sedation was used.  For mg Versed, 200 mcg Fentanyl were administered intravenously.  The patient's vital signs were monitored continuously by radiology nursing throughout the procedure.  Sedation Time: 25 minutes  Fluoroscopy time: 3.4 minutes  Contrast volume: 15 ml Omnipaque 300 administered into the stomach  Antibiotics:  1g Ancef was administered intravenously within 1 hour of skin incision.  PROCEDURE/FINDINGS:   Informed consent was  obtained from the patient following explanation of the procedure, risks, benefits and alternatives. The patient understands, agrees and consents for the procedure. All questions were addressed. A time out was performed.  Maximal barrier sterile technique utilized including caps, mask, sterile gowns, sterile gloves, large sterile drape, hand hygiene, and chlorhexadine skin prep.  An angled catheter was advanced over a wire under fluoroscopic guidance through the nose, down the esophagus and into the body of the stomach.  The stomach was then insufflated with several 100 ml of air.  Fluoroscopy confirmed location of the gastric bubble, as well as inferior displacement of the barium stained colon.  Under direct fluoroscopic guidance, a single T-tack was placed, and the anterior gastric wall drawn up against the anterior abdominal wall. Percutaneous access was then obtained into the mid gastric body with an 18 gauge sheath needle.  Aspiration of air, and injection of contrast material under fluoroscopy confirmed needle placement.  An Amplatz wire was advanced in the gastric body and the access needle exchanged for a 9-French vascular sheath.  A snare device was advanced through the vascular sheath and an Amplatz wire advanced through the angled catheter.  The Amplatz wire was successfully snared and this was pulled up through the esophagus and out the mouth.  A 20-French Alinda Dooms MIC-PEG tube was then connected to the snare and pulled through the mouth, down the esophagus, into the stomach  and out to the anterior abdominal wall. Hand injection of contrast material confirmed intragastric location. The T-tack retention suture was then cut. The pull through peg tube was then secured with the external bumper and capped.  The patient will be observed for several hours with the newly placed tube on low wall suction to evaluate for any post procedure complication.  The patient tolerated the procedure well, there is no  immediate complication.  IMPRESSION:  Successful placement of a 20 French pull through gastrostomy tube.  The tube will be ready for use 5,000,000 tomorrow (08/18/2012).  Signed,  Criselda Peaches, MD Vascular & Interventional Radiologist [facility]   Original Report Authenticated By: Myrle Sheng    Mr Neck Soft Tissue Only Wo Contrast  08/17/2012  *RADIOLOGY REPORT*  Clinical Data:  66 year old female with bulky supraglottic larynx mass.  Question paraglottic involvement.  Airway compromise, but now status post tracheostomy.  Despite multiple attempts, both supine and decubitus, the patient was unable to tolerate MRI imaging.  The patient refused a planned repeat attempt with conscious sedation.  MRI NECK WITHOUT CONTRAST  Technique:  Multiplanar, multiecho pulse sequences of the neck and surrounding structures were obtained without intravenous contrast.  Comparison:  CT neck with contrast 08/08/2012.  Findings:  Axial T2 and only several axial T1-weighted noncontrast images of the neck are provided.  The bulky right greater than left supraglottic mass is heterogeneously T2 hyperintense and hypointense on T1-weighted imaging.  Similar signal in the bulky right level II lymphadenopathy is noted.  On axial T1 image 6 of series 4 there is loss of the right paraglottic fat signal.  This is in the area at or just superior to the right thyroid cartilage.  IMPRESSION: 1.  The examination had to be discontinued prior to completion due to patient request. 2.  MRI evidence of right paraglottic soft tissue involvement by the supraglottic mass, favor T4 tumor stage.   Original Report Authenticated By: Randall An, M.D.    Nm Pet Image Initial (pi) Skull Base To Thigh  08/30/2012  *RADIOLOGY REPORT*  Clinical Data: Initial treatment strategy for head and neck. 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 CT data was obtained and used for attenuation correction and anatomic localization only.  (This was not acquired as a diagnostic CT examination.) Additional exam technical data entered on technologist worksheet.  Comparison:  Neck CT 08/08/2012  Findings:  Neck: There is an  intensely hypermetabolic mass centered within the posterior right aspect of the glottis with SUV max = [id number]  Hypermetabolic enlarged right level II lymph node measuring 18 mm (image 27) with SUV max = 7.5 consistent with local nodal metastasis.  No additional evidence of metastatic disease in the neck.  There is intense activity associated with tracheostomy site related to inflammation from recent surgery.  Chest:  Within the left chest wall,  there is focal intense hypermetabolic active associated with two adjacent fractures within the left lateral fifth and sixth ribs (images 65 and 74 respectively).  There is mild callus formation at the fracture sites.  No hypermetabolic mediastinal lymph nodes.  There is small focus of atelectasis at the right lung base (image 89) with associated metabolic activity which is likely inflammatory or infectious.  No suspicious pulmonary nodules.  Abdomen/Pelvis:  No abnormal hypermetabolic activity within the liver, pancreas, adrenal glands, or  spleen.  No hypermetabolic lymph nodes in the abdomen or pelvis.  Skeleton:  No focal hypermetabolic activity to suggest skeletal metastasis. Left rib fractures as above.  IMPRESSION:  1.  Intensely hypermetabolic primary carcinoma within the posterior right glottis. 2.  Local nodal metastasis to the right level II station. 3.  Intense uptake in adjacent left lateral ribs fractures are in a traumatic pattern.  Cannot completely exclude pathologic fractures although not favored.  Recommend correlation with trauma. 4.  No evidence of systemic disease otherwise.  5.  Mild metabolic activity associated with right lower lobe atelectasis is likely  infectious or inflammatory.   Original Report Authenticated By: Suzy Bouchard, M.D.    Dg Chest Port 1 View  09/01/2012  *RADIOLOGY REPORT*  Clinical Data: Follow-up tracheostomy placement  PORTABLE CHEST - 1 VIEW  Comparison: 08/31/2012  Findings: Surgical clips project over the neck. Tracheostomy tube not visualized. Mild bibasilar opacity.  Heart size upper normal. Trace left pleural effusion not excluded.  No pneumothorax.  No acute osseous finding.  IMPRESSION: Mild bibasilar opacities; atelectasis versus infiltrate.   Original Report Authenticated By: Suanne Marker, M.D.    Dg Chest Port 1 View  08/31/2012  *RADIOLOGY REPORT*  Clinical Data: Post laryngectomy.  PORTABLE CHEST - 1 VIEW  Comparison: 08/26/2012.  Findings: Biapical pleural thickening without associate bony destruction noted on the preoperative exam.  Heart size top normal.  Mild central pulmonary vascular prominence without pulmonary edema.  No pneumothorax or segmental infiltrate.  IMPRESSION: No acute abnormality.   Original Report Authenticated By: Doug Sou, M.D.    Dg Chest Port 1 View  08/10/2012  *RADIOLOGY REPORT*  Clinical Data: Status post tracheostomy.  PORTABLE CHEST - 1 VIEW  Comparison: CT of the chest 08/08/2012.  Findings: A tracheostomy has been placed.  The tip is at the level the clavicles, well above the carina.  The heart size is normal. New left basilar airspace disease likely reflects atelectasis.  The lung volumes are slightly decreased from prior study.  IMPRESSION:  1.  Interval placement of a tracheostomy tube without radiographic evidence for complication. 2.  New left basilar airspace disease.  This likely reflects atelectasis.  Early infection or aspiration is not excluded.   Original Report Authenticated By: Resa Miner. MATTERN, M.D.    Dg Swallowing Func-no Report  08/16/2012  CLINICAL DATA: Eval swallowing per ST recs.   FLUOROSCOPY FOR SWALLOWING FUNCTION STUDY:  Fluoroscopy was  provided for swallowing function study, which was  administered by a speech pathologist.  Final results and recommendations  from this study are contained within the speech pathology report.           Subjective: Patient had significant pain overnight asking for pain medications every 2-4 hours. No vomiting, no diarrhea, complaints of constipation. No respiratory distress. Tolerating tube feeds. Denies any abdominal pain, headaches, chest pain, shortness of breath, fevers.  Objective: Filed Vitals:   09/03/12 0008 09/03/12 0213 09/03/12 0636 09/03/12 0812  BP: 137/59 160/64 142/63   Pulse: 96 85 80 63  Temp: 98.8 F (37.1 C) 98.4 F (36.9 C) 98.7 F (37.1 C)   TempSrc:      Resp: 18 18 18 16   Height:      Weight:      SpO2: 96% 97% 98% 98%    Intake/Output Summary (Last 24 hours) at 09/03/12 0834 Last data filed at 09/03/12 0539  Gross per 24 hour  Intake 2373.6 ml  Output   1503  ml  Net  870.6 ml   Weight change:  Exam:   General:  Pt is alert, follows commands appropriately, not in acute distress  HEENT: Incision intact without erythema or drainage, drains in place [place]/AT  Cardiovascular: RRR, S1/S2, no rubs, no gallops  Respiratory: Clear to auscultation bilaterally, no wheezing, no crackles, no rhonchi  Abdomen: Soft, non tender, non distended, bowel sounds present, no guarding, G-tube site without erythema or induration  Extremities: No edema, No lymphangitis, No petechiae, No rashes, no synovitis  Data Reviewed: Basic Metabolic Panel:  Lab [id number] 09/02/12 0430 09/01/12 0410 08/31/12 1419  NA 137 139 137 136  K 4.3 4.2 4.4 4.6  CL 101 104 102 --  CO2 26 24 23  --  GLUCOSE 125* 146* 143* 122*  BUN 17 18 36* --  CREATININE 0.97 1.10 1.45* --  CALCIUM 9.4 9.3 9.2 --  MG 1.7 -- -- --  PHOS 3.0 -- -- --   Liver Function Tests: No results found for this basename: AST:5,ALT:5,ALKPHOS:5,BILITOT:5,PROT:5,ALBUMIN:5 in the last 168 hours No results  found for this basename: LIPASE:5,AMYLASE:5 in the last 168 hours No results found for this basename: AMMONIA:5 in the last 168 hours CBC:  Lab 09/02/12 0430 09/01/12 0410 08/31/12 1419  WBC 10.2 8.6 --  NEUTROABS 7.7 -- --  HGB 10.3* 10.3* 7.8*  HCT 32.1* 30.6* 23.0*  MCV 86.1 82.3 --  PLT 289 282 --   Cardiac Enzymes: No results found for this basename: CKTOTAL:5,CKMB:5,CKMBINDEX:5,TROPONINI:5 in the last 168 hours BNP: No components found with this basename: POCBNP:5 CBG:  Lab 09/03/12 0021 09/02/12 2021 09/02/12 1840 09/02/12 1250 09/02/12 0743  GLUCAP 147* 146* 104* 116* 148*    No results found for this or any previous visit (from the past 240 hour(s)).   Scheduled Meds:   . amitriptyline  10 mg Oral QHS  . antiseptic oral rinse  15 mL Mouth Rinse q12n4p  . atenolol  12.5 mg Per Tube BID  . bacitracin  1 application Topical [id number]  . chlorhexidine  15 mL Mouth Rinse BID  . fentaNYL  12.5 mcg Transdermal Q72H  . mirtazapine  15 mg Oral QHS  . neomycin-bacitracin-polymyxin  1 application Topical BID  . nicotine  21 mg Transdermal Daily  . ranitidine  150 mg Per Tube Daily  . simvastatin  20 mg Oral q1800  . DISCONTD: amLODipine  2.5 mg Per Tube Daily   Continuous Infusions:   . dextrose 5 % and 0.45 % NaCl with KCl 20 mEq/L 75 mL/hr at 09/02/12 1225  . feeding supplement (OSMOLITE 1.2 CAL) 1,000 mL (09/02/12 1836)     Seletha Zimmermann, DO  [facility] Pager [phone]  If 7PM-7AM, please contact night-coverage [email or web address] Password [facility] 09/03/2012, 8:34 AM   LOS: 3 days

## 2012-09-03 NOTE — Progress Notes (Signed)
Patient doing well.  JP drainage moderate  Examination-she is awake and alert. Her neck looks excellent. Stoma is open. No evidence of hematoma or infection. JP drain still intact. Lungs are clear. She needs to have reduction of the IV fluids that she's getting a adequate condition through her PEG tube. Also her JP drains need to be pinned so she can mobilize better. She will be up in a chair today and ambulate. Continue care

## 2012-09-03 NOTE — Progress Notes (Signed)
Cleared 304 ml of tube feeding from pump and 20 ml of sterile water flush

## 2012-09-04 LAB — TYPE AND SCREEN
Unit division: 0
Unit division: 0
Unit division: 0

## 2012-09-04 LAB — BASIC METABOLIC PANEL
CO2: 26 mEq/L (ref 19–32)
Chloride: 100 mEq/L (ref 96–112)
GFR calc non Af Amer: 54 mL/min — ABNORMAL LOW (ref >90)
Glucose, Bld: 117 mg/dL — ABNORMAL HIGH (ref 70–99)
Potassium: 4.4 mEq/L (ref 3.5–5.1)
Sodium: 138 mEq/L (ref 135–145)

## 2012-09-04 LAB — CBC
HCT: 33.6 % — ABNORMAL LOW (ref 36.0–46.0)
Hemoglobin: 11 g/dL — ABNORMAL LOW (ref 12.0–15.0)
MCH: 28 pg (ref 26.0–34.0)
MCV: 85.5 fL (ref 78.0–100.0)
RBC: 3.93 MIL/uL (ref 3.87–5.11)

## 2012-09-04 LAB — PREALBUMIN: Prealbumin: 20.8 mg/dL (ref 17.0–34.0)

## 2012-09-04 MED ORDER — ONDANSETRON HCL 4 MG/2ML IJ SOLN
4.0000 mg | Freq: Four times a day (QID) | INTRAMUSCULAR | Status: DC
  Administered 2012-09-04 – 2012-09-14 (×40): 4 mg via INTRAVENOUS
  Filled 2012-09-04 (×27): qty 2

## 2012-09-04 MED ORDER — METOCLOPRAMIDE HCL 5 MG/5ML PO SOLN
5.0000 mg | Freq: Three times a day (TID) | ORAL | Status: DC
  Administered 2012-09-04 – 2012-09-05 (×3): 5 mg via ORAL
  Filled 2012-09-04 (×5): qty 5

## 2012-09-04 MED ORDER — LACTULOSE 10 GM/15ML PO SOLN
10.0000 g | Freq: Four times a day (QID) | ORAL | Status: DC
  Administered 2012-09-04 (×2): 10 g via ORAL
  Filled 2012-09-04 (×8): qty 15

## 2012-09-04 MED ORDER — AMLODIPINE BESYLATE 2.5 MG PO TABS
2.5000 mg | ORAL_TABLET | Freq: Every day | ORAL | Status: DC
  Administered 2012-09-04 – 2012-09-14 (×11): 2.5 mg
  Filled 2012-09-04 (×11): qty 1

## 2012-09-04 NOTE — Progress Notes (Signed)
Pt refuses suction, and stoma care.

## 2012-09-04 NOTE — Progress Notes (Signed)
[facility] PROGRESS NOTE  [name] [id number] DOB: [birth year] DOA: 08/31/2012 PCP: [name], MD  Assessment/Plan: Acute on chronic pain syndrome  -Patient has received 32 mg IV morphine in 24 hours  -Increased fentanyl patch to 25 mcg  -Continue intermittent breakthrough medications  -Remeron and Elavil at bedtime  Hypertension  -Continue atenolol, amlodipine -Restart ARB, if blood pressure remains uncontrolled Constipation  -Start cathartics  -Add lactulose in addition to Colace and Senokot Malnutrition  -Prealbumin 20.8----will not add any additional supplementation -Continue tube feeds Osmolite bolus feedings -Patient is tolerating tube feeds at this time but complains of some nausea -Will add Zofran around-the-clock and Reglan at the time of bolus feed Hyperlipidemia  -Continue Zocor  Tobacco use, presumptive COPD  -Intermittent bronchodilators  -Continue Nicoderm patch      Family Communication:  Family at beside Disposition Plan:   Home when medically stable   Procedures:  Total laryngectomy September 18      Procedures/Studies: Dg Chest 2 View  08/26/2012  *RADIOLOGY REPORT*  Clinical Data: Preop for laryngectomy  CHEST - 2 VIEW  Comparison: 08/10/2012  Findings: Cardiomediastinal silhouette is stable.  Tracheostomy tube is unchanged in position.  No acute infiltrate or pleural effusion.  No pulmonary edema. Bony thorax is unremarkable.  IMPRESSION:  No active disease.  Stable tracheostomy tube position.   Original Report Authenticated By: Lahoma Crocker, M.D.    Ct Soft Tissue Neck W Contrast  08/08/2012  *RADIOLOGY REPORT*  Clinical Data: Laryngeal mass partially imaged on a chest CTA earlier today.  CT NECK WITH CONTRAST  Technique:  Multidetector CT imaging of the neck was performed with intravenous contrast.  Contrast: 68mL OMNIPAQUE IOHEXOL 300 MG/ML  SOLN  Comparison: Chest CTA obtained earlier today.  Findings: Again demonstrated is a  large mass filling and expanding the larynx and hypopharynx above the level of the true cords.  This is involving the epiglottis and aryepiglottic folds and extending into the vallecula and piriform sinuses, greater on the right. This has a fluid density component on the left and is causing marked airway narrowing.  This mass measures 3.8 x 2.6 cm in maximum dimensions on image number 36 of series 8.  On coronal image number 49 of series 9, this measures 3.2 cm in length.  Also demonstrated is a bilobed or two adjacent lymph nodes with central low density in the lateral neck on the right, anterior to the sternocleidomastoid muscle.  This measures 1.8 x 1.7 cm in maximum dimensions on image number 55 of series 2 and 2.7 cm in length on coronal image number 46 of series 3.  No additional enlarged lymph nodes are identified.  Mild bullous changes are demonstrated at the lung apices with biapical pleural and parenchymal scarring.  The thyroid gland is unremarkable.  Cervical spine degenerative changes.  IMPRESSION:  1.  3.8 x 3.2 x 2.6 cm laryngeal mass above the level of the true cords, involving the aryepiglottic folds, epiglottis, piriform sinuses and vallecula.  This is necrotic on the left and is causing marked airway narrowing.  This is most compatible with a primary squamous cell carcinoma. 2.  Necrotic metastatic adenopathy on the right, compatible with metastatic squamous cell carcinoma. 3.  COPD.   Original Report Authenticated By: Gerald Stabs, M.D.    Ct Angio Chest Pe W/cm &/or Wo Cm  08/08/2012  *RADIOLOGY REPORT*  Clinical Data: Shortness of breath.  Wheezing.  CT ANGIOGRAPHY CHEST  Technique:  Multidetector CT imaging of the  chest using the standard protocol during bolus administration of intravenous contrast. Multiplanar reconstructed images including MIPs were obtained and reviewed to evaluate the vascular anatomy.  Contrast:  9mL OMNIPAQUE IOHEXOL 350 MG/ML SOLN  Comparison: Chest x-ray dated  08/01/2012  Findings: There is a displaced fracture of the lateral aspect of the left sixth rib and a nondisplaced fracture of the lateral aspect of the left fifth rib. There is also a fracture of the anterior lateral aspect of the left seventh rib.  There is a 4.5 mm diameter cylindrical density in the lateral aspect of the right middle lobe, unchanged since prior CT scan dated 05/02/2003.  This could be a chronically impacted bronchus. This is not felt to be significant.  There is abnormal soft tissue prominence in the larynx just above the true cords asymmetric to the right with marked transverse narrowing of the airway.  This could represent a mass.  The area is incompletely visualized on the chest CT.  No visible adenopathy.  Heart size is normal. No infiltrates or effusions.  No pulmonary emboli.  Moderate hiatal hernia.  IMPRESSION:  1.  There appears to be a soft tissue mass in the larynx just above the true cords compressing the airway. 2.  Fractures of the lateral aspects of the left fifth, sixth, and seventh ribs. There may be a fracture of the anterior lateral aspect of the left eighth rib as well. 3.  No pulmonary emboli.   Original Report Authenticated By: Larey Seat, M.D.    Ir Gastrostomy Tube Mod Sed  08/17/2012  *RADIOLOGY REPORT*  PULL THROUGH GASTROSTOMY TUBE PLACEMENT UNDER FLUOROSCOPIC GUIDANCE  Date: 08/17/2012  Clinical History: 66 year old female with laryngeal cancer and dysphagia.  Percutaneous gastrostomy tube required for parenteral feeding during upcoming chemo and radiation therapy.  Procedures Performed:  1.  Fluoroscopically guided placement of percutaneous pull-through gastrostomy tube.  Interventional Radiologist:  Criselda Peaches, MD  Sedation: Moderate (conscious) sedation was used.  For mg Versed, 200 mcg Fentanyl were administered intravenously.  The patient's vital signs were monitored continuously by radiology nursing throughout the procedure.  Sedation Time: 25  minutes  Fluoroscopy time: 3.4 minutes  Contrast volume: 15 ml Omnipaque 300 administered into the stomach  Antibiotics:  1g Ancef was administered intravenously within 1 hour of skin incision.  PROCEDURE/FINDINGS:   Informed consent was obtained from the patient following explanation of the procedure, risks, benefits and alternatives. The patient understands, agrees and consents for the procedure. All questions were addressed. A time out was performed.  Maximal barrier sterile technique utilized including caps, mask, sterile gowns, sterile gloves, large sterile drape, hand hygiene, and chlorhexadine skin prep.  An angled catheter was advanced over a wire under fluoroscopic guidance through the nose, down the esophagus and into the body of the stomach.  The stomach was then insufflated with several 100 ml of air.  Fluoroscopy confirmed location of the gastric bubble, as well as inferior displacement of the barium stained colon.  Under direct fluoroscopic guidance, a single T-tack was placed, and the anterior gastric wall drawn up against the anterior abdominal wall. Percutaneous access was then obtained into the mid gastric body with an 18 gauge sheath needle.  Aspiration of air, and injection of contrast material under fluoroscopy confirmed needle placement.  An Amplatz wire was advanced in the gastric body and the access needle exchanged for a 9-French vascular sheath.  A snare device was advanced through the vascular sheath and an Amplatz wire advanced through  the angled catheter.  The Amplatz wire was successfully snared and this was pulled up through the esophagus and out the mouth.  A 20-French Alinda Dooms MIC-PEG tube was then connected to the snare and pulled through the mouth, down the esophagus, into the stomach and out to the anterior abdominal wall. Hand injection of contrast material confirmed intragastric location. The T-tack retention suture was then cut. The pull through peg tube was then secured  with the external bumper and capped.  The patient will be observed for several hours with the newly placed tube on low wall suction to evaluate for any post procedure complication.  The patient tolerated the procedure well, there is no immediate complication.  IMPRESSION:  Successful placement of a 20 French pull through gastrostomy tube.  The tube will be ready for use 5,000,000 tomorrow (08/18/2012).  Signed,  Criselda Peaches, MD Vascular & Interventional Radiologist [facility]   Original Report Authenticated By: Myrle Sheng    Mr Neck Soft Tissue Only Wo Contrast  08/17/2012  *RADIOLOGY REPORT*  Clinical Data:  66 year old female with bulky supraglottic larynx mass.  Question paraglottic involvement.  Airway compromise, but now status post tracheostomy.  Despite multiple attempts, both supine and decubitus, the patient was unable to tolerate MRI imaging.  The patient refused a planned repeat attempt with conscious sedation.  MRI NECK WITHOUT CONTRAST  Technique:  Multiplanar, multiecho pulse sequences of the neck and surrounding structures were obtained without intravenous contrast.  Comparison:  CT neck with contrast 08/08/2012.  Findings:  Axial T2 and only several axial T1-weighted noncontrast images of the neck are provided.  The bulky right greater than left supraglottic mass is heterogeneously T2 hyperintense and hypointense on T1-weighted imaging.  Similar signal in the bulky right level II lymphadenopathy is noted.  On axial T1 image 6 of series 4 there is loss of the right paraglottic fat signal.  This is in the area at or just superior to the right thyroid cartilage.  IMPRESSION: 1.  The examination had to be discontinued prior to completion due to patient request. 2.  MRI evidence of right paraglottic soft tissue involvement by the supraglottic mass, favor T4 tumor stage.   Original Report Authenticated By: Randall An, M.D.    Nm Pet Image Initial (pi) Skull Base To Thigh  08/30/2012   *RADIOLOGY REPORT*  Clinical Data: Initial treatment strategy for head and neck. 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 CT data was obtained and used for attenuation correction and anatomic localization only.  (This was not acquired as a diagnostic CT examination.) Additional exam technical data entered on technologist worksheet.  Comparison:  Neck CT 08/08/2012  Findings:  Neck: There is an  intensely hypermetabolic mass centered within the posterior right aspect of the glottis with SUV max = [id number]  Hypermetabolic enlarged right level II lymph node measuring 18 mm (image 27) with SUV max = 7.5 consistent with local nodal metastasis.  No additional evidence of metastatic disease in the neck.  There is intense activity associated with tracheostomy site related to inflammation from recent surgery.  Chest:  Within the left chest wall,  there is focal intense hypermetabolic active associated with two adjacent fractures within the left lateral fifth and sixth ribs (images 65 and 74 respectively).  There is mild callus formation at the fracture sites.  No hypermetabolic  mediastinal lymph nodes.  There is small focus of atelectasis at the right lung base (image 89) with associated metabolic activity which is likely inflammatory or infectious.  No suspicious pulmonary nodules.  Abdomen/Pelvis:  No abnormal hypermetabolic activity within the liver, pancreas, adrenal glands, or spleen.  No hypermetabolic lymph nodes in the abdomen or pelvis.  Skeleton:  No focal hypermetabolic activity to suggest skeletal metastasis. Left rib fractures as above.  IMPRESSION:  1.  Intensely hypermetabolic primary carcinoma within the posterior right glottis. 2.  Local nodal metastasis to the right level II station. 3.  Intense uptake in adjacent left lateral ribs fractures are in a traumatic pattern.   Cannot completely exclude pathologic fractures although not favored.  Recommend correlation with trauma. 4.  No evidence of systemic disease otherwise.  5.  Mild metabolic activity associated with right lower lobe atelectasis is likely infectious or inflammatory.   Original Report Authenticated By: Suzy Bouchard, M.D.    Dg Chest Port 1 View  09/01/2012  *RADIOLOGY REPORT*  Clinical Data: Follow-up tracheostomy placement  PORTABLE CHEST - 1 VIEW  Comparison: 08/31/2012  Findings: Surgical clips project over the neck. Tracheostomy tube not visualized. Mild bibasilar opacity.  Heart size upper normal. Trace left pleural effusion not excluded.  No pneumothorax.  No acute osseous finding.  IMPRESSION: Mild bibasilar opacities; atelectasis versus infiltrate.   Original Report Authenticated By: Suanne Marker, M.D.    Dg Chest Port 1 View  08/31/2012  *RADIOLOGY REPORT*  Clinical Data: Post laryngectomy.  PORTABLE CHEST - 1 VIEW  Comparison: 08/26/2012.  Findings: Biapical pleural thickening without associate bony destruction noted on the preoperative exam.  Heart size top normal.  Mild central pulmonary vascular prominence without pulmonary edema.  No pneumothorax or segmental infiltrate.  IMPRESSION: No acute abnormality.   Original Report Authenticated By: Doug Sou, M.D.    Dg Chest Port 1 View  08/10/2012  *RADIOLOGY REPORT*  Clinical Data: Status post tracheostomy.  PORTABLE CHEST - 1 VIEW  Comparison: CT of the chest 08/08/2012.  Findings: A tracheostomy has been placed.  The tip is at the level the clavicles, well above the carina.  The heart size is normal. New left basilar airspace disease likely reflects atelectasis.  The lung volumes are slightly decreased from prior study.  IMPRESSION:  1.  Interval placement of a tracheostomy tube without radiographic evidence for complication. 2.  New left basilar airspace disease.  This likely reflects atelectasis.  Early infection or aspiration is not  excluded.   Original Report Authenticated By: Resa Miner. MATTERN, M.D.    Dg Swallowing Func-no Report  08/16/2012  CLINICAL DATA: Eval swallowing per ST recs.   FLUOROSCOPY FOR SWALLOWING FUNCTION STUDY:  Fluoroscopy was provided for swallowing function study, which was  administered by a speech pathologist.  Final results and recommendations  from this study are contained within the speech pathology report.           Subjective: Patient complains of some nausea around the time of bolus feeding. No vomiting. Remains constipated. Denies chest pain, shortness breath, headache, dizziness, fevers, chills. Still complains of pain around neck particularly with movement and activity  Objective: Filed Vitals:   09/04/12 0600 09/04/12 0704 09/04/12 0804 09/04/12 1100  BP:  134/59    Pulse: 63 65 66 65  Temp:  99 F (37.2 C)    TempSrc:      Resp: 20 19 20 20   Height:      Weight:  SpO2: 99% 98% 98% 99%    Intake/Output Summary (Last 24 hours) at 09/04/12 1204 Last data filed at 09/04/12 0711  Gross per 24 hour  Intake 1748.5 ml  Output    915 ml  Net  833.5 ml   Weight change:  Exam:   General:  Pt is alert, follows commands appropriately, not in acute distress  HEENT: No icterus, No thrush, neck is without any erythema, drainage, crepitance  Cardiovascular: RRR, S1/S2, no rubs, no gallops  Respiratory: Clear to auscultation bilaterally, no wheezing, no crackles, no rhonchi  Abdomen: Soft/+BS, non tender, non distended, no guarding  Extremities: No edema, No lymphangitis, No petechiae, No rashes, no synovitis  Data Reviewed: Basic Metabolic Panel:  Lab 0000000 0515 09/03/12 0620 09/02/12 0430 09/01/12 0410 08/31/12 1419  NA 138 137 139 137 136  K 4.4 4.3 4.2 4.4 4.6  CL 100 101 104 102 --  CO2 26 26 24 23  --  GLUCOSE 117* 125* 146* 143* 122*  BUN 20 17 18  36* --  CREATININE 1.05 0.97 1.10 1.45* --  CALCIUM 9.9 9.4 9.3 9.2 --  MG -- 1.7 -- -- --  PHOS --  3.0 -- -- --   Liver Function Tests: No results found for this basename: AST:5,ALT:5,ALKPHOS:5,BILITOT:5,PROT:5,ALBUMIN:5 in the last 168 hours No results found for this basename: LIPASE:5,AMYLASE:5 in the last 168 hours No results found for this basename: AMMONIA:5 in the last 168 hours CBC:  Lab 09/04/12 0515 09/02/12 0430 09/01/12 0410 08/31/12 1419  WBC 11.4* 10.2 8.6 --  NEUTROABS -- 7.7 -- --  HGB 11.0* 10.3* 10.3* 7.8*  HCT 33.6* 32.1* 30.6* 23.0*  MCV 85.5 86.1 82.3 --  PLT 308 289 282 --   Cardiac Enzymes: No results found for this basename: CKTOTAL:5,CKMB:5,CKMBINDEX:5,TROPONINI:5 in the last 168 hours BNP: No components found with this basename: POCBNP:5 CBG:  Lab 09/03/12 1745 09/03/12 1235 09/03/12 0750 09/03/12 0021 09/02/12 2021  GLUCAP 95 125* 113* 147* 146*    No results found for this or any previous visit (from the past 240 hour(s)).   Scheduled Meds:   . amitriptyline  10 mg Oral QHS  . amLODipine  2.5 mg Per Tube Daily  . antiseptic oral rinse  15 mL Mouth Rinse q12n4p  . atenolol  12.5 mg Per Tube BID  . bacitracin  1 application Topical Q000111Q  . chlorhexidine  15 mL Mouth Rinse BID  . docusate  100 mg Per Tube BID  . feeding supplement (OSMOLITE 1.2 CAL)  237 mL Per Tube Q6H  . fentaNYL  25 mcg Transdermal Q72H  . lactulose  10 g Oral Q6H  . metoCLOPramide  5 mg Oral TID AC  . mirtazapine  15 mg Oral QHS  . neomycin-bacitracin-polymyxin  1 application Topical BID  . nicotine  21 mg Transdermal Daily  . ondansetron  4 mg Intravenous Q6H  . ranitidine  150 mg Per Tube Daily  . sennosides  5 mL Per Tube BID  . simvastatin  20 mg Oral q1800  . DISCONTD: amLODipine  2.5 mg Per Tube Daily  . DISCONTD: feeding supplement (OSMOLITE 1.2 CAL)  250 mL Per Tube Q6H   Continuous Infusions:   . dextrose 5 % and 0.45 % NaCl with KCl 20 mEq/L 30 mL/hr at 09/03/12 1804  . DISCONTD: feeding supplement (OSMOLITE 1.2 CAL)       Ann Fitzner, DO  [facility] Pager [phone]  If 7PM-7AM, please contact night-coverage [email or web address] Password [facility] 09/04/2012, 12:04  PM   LOS: 4 days

## 2012-09-04 NOTE — Progress Notes (Signed)
RT unavailable to perform cpt on pt. Due to pt. Experiencing pain. RN aware of pt. Pain. RT will reassess.

## 2012-09-04 NOTE — Progress Notes (Signed)
She is doing well. Afebrile. White count stable.  Awake and alert. Neck looks excellent. Just a slight erythema around the stoma. Stoma open. Lungs- clear  She is tolerating bolus feeding better. She needs to ambulate and I discussed this again with the family.

## 2012-09-05 DIAGNOSIS — D649 Anemia, unspecified: Secondary | ICD-10-CM

## 2012-09-05 LAB — BASIC METABOLIC PANEL
BUN: 22 mg/dL (ref 6–23)
Calcium: 9.9 mg/dL (ref 8.4–10.5)
GFR calc non Af Amer: 54 mL/min — ABNORMAL LOW (ref >90)
Glucose, Bld: 131 mg/dL — ABNORMAL HIGH (ref 70–99)
Sodium: 139 mEq/L (ref 135–145)

## 2012-09-05 LAB — CBC
MCH: 27.6 pg (ref 26.0–34.0)
MCHC: 32.2 g/dL (ref 30.0–36.0)
Platelets: 295 10*3/uL (ref 150–400)
RBC: 3.91 MIL/uL (ref 3.87–5.11)

## 2012-09-05 LAB — MAGNESIUM: Magnesium: 1.9 mg/dL (ref 1.5–2.5)

## 2012-09-05 MED ORDER — METOCLOPRAMIDE HCL 5 MG/5ML PO SOLN
5.0000 mg | Freq: Three times a day (TID) | ORAL | Status: DC
  Administered 2012-09-06 – 2012-09-14 (×25): 5 mg via ORAL
  Filled 2012-09-05 (×28): qty 5

## 2012-09-05 MED ORDER — OSMOLITE 1.2 CAL PO LIQD
474.0000 mL | Freq: Three times a day (TID) | ORAL | Status: DC
  Administered 2012-09-06 – 2012-09-14 (×25): 474 mL
  Filled 2012-09-05 (×35): qty 474

## 2012-09-05 MED ORDER — OSMOLITE 1.2 CAL PO LIQD
237.0000 mL | Freq: Three times a day (TID) | ORAL | Status: DC
  Filled 2012-09-05 (×2): qty 237

## 2012-09-05 NOTE — Care Management Note (Signed)
  Page 2 of 2   09/05/2012     3:05:12 PM   CARE MANAGEMENT NOTE 09/05/2012  Patient:  [name], [name]   Account Number:  [id number]  Date Initiated:  09/01/2012  Documentation initiated by:  [facility]  Subjective/Objective Assessment:   S/p radical neck for ca.  Has sons and daughter in law  previously has used [place] through [facility].     Action/Plan:   Anticipated DC Date:  09/08/2012   Anticipated DC Plan:  [place]  CM consult      Choice offered to / List presented to:             [place].   Status of service:   Medicare Important Message given?   (If response is "NO", the following Medicare IM given date fields will be blank) Date Medicare IM given:   Date Additional Medicare IM given:    Discharge Disposition:    Per UR Regulation:  Reviewed for med. necessity/level of care/duration of stay  If discussed at Long Length of Stay Meetings, dates discussed:    Comments:  ContactWayne Sever [phone]   [phone]                 Gramisci,Jessica Relative [phone]  09-05-12 Has used 32 mg IV Morphine in 24 hours, increase Fentanyl patch .  On scheduled Zofran IV Q 6 hrs .  Daughter in law [place] in room . Janett Billow takes care of patient at home.  Already has humidied air, suction and feeding tube.  Janett Billow and patient want to remain with [place] . Will need order to resume [place] RN BSN [phone]    09-02-12 11:14am Luz Lex, RNBSN [phone] Has stoma and neck staples - three drains.  OT ordered.  CM will continue to follow for discharge needs.

## 2012-09-05 NOTE — Progress Notes (Signed)
[facility] PROGRESS NOTE  [name] [id number] DOB: Dec 05, 1946 DOA: 08/31/2012 PCP: [name], MD  Assessment/Plan:  Acute on chronic pain syndrome  -Patient has received 32 mg IV morphine in 24 hours  -Increased fentanyl patch to 25 mcg  -Continue intermittent breakthrough medications  -Remeron and Elavil at bedtime  Hypertension  -Continue atenolol, amlodipine  -Restart ARB, if blood pressure remains uncontrolled  Constipation  -Start cathartics  -Add lactulose in addition to Colace and Senokot  Malnutrition  -Prealbumin 20.8----will not add any additional supplementation  -Continue tube feeds Osmolite bolus feedings -Change feedings to 2 can q. 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  Tracheostomy tube is unchanged in position.  No acute infiltrate or pleural effusion.  No pulmonary edema. Bony thorax is unremarkable.  IMPRESSION:  No active disease.  Stable tracheostomy tube position.   Original Report Authenticated By: Lahoma Crocker, M.D.    Ct Soft Tissue Neck W Contrast  08/08/2012  *RADIOLOGY REPORT*  Clinical Data: Laryngeal mass partially imaged on a chest CTA earlier today.  CT NECK WITH CONTRAST  Technique:  Multidetector CT imaging of the neck was performed with intravenous contrast.  Contrast: 81mL OMNIPAQUE IOHEXOL 300 MG/ML  SOLN   Comparison: Chest CTA obtained earlier today.  Findings: Again demonstrated is a large mass filling and expanding the larynx and hypopharynx above the level of the true cords.  This is involving the epiglottis and aryepiglottic folds and extending into the vallecula and piriform sinuses, greater on the right. This has a fluid density component on the left and is causing marked airway narrowing.  This mass measures 3.8 x 2.6 cm in maximum dimensions on image number 36 of series 8.  On coronal image number 49 of series 9, this measures 3.2 cm in length.  Also demonstrated is a bilobed or two adjacent lymph nodes with central low density in the lateral neck on the right, anterior to the sternocleidomastoid muscle.  This measures 1.8 x 1.7 cm in maximum dimensions on image number 55 of series 2 and 2.7 cm in length on coronal image number 46 of series 3.  No additional enlarged lymph nodes are identified.  Mild bullous changes are demonstrated at the lung apices with biapical pleural and parenchymal scarring.  The thyroid gland is unremarkable.  Cervical spine degenerative changes.  IMPRESSION:  1.  3.8 x 3.2 x 2.6 cm laryngeal mass above the level of the true cords, involving the aryepiglottic folds, epiglottis, piriform sinuses and vallecula.  This is necrotic on the left and is causing marked airway narrowing.  This is most compatible with a primary squamous cell carcinoma. 2.  Necrotic metastatic adenopathy on the right, compatible with metastatic squamous cell carcinoma. 3.  COPD.   Original Report Authenticated By: Gerald Stabs, M.D.    Ct Angio Chest Pe W/cm &/or Wo Cm  08/08/2012  *RADIOLOGY  REPORT*  Clinical Data: Shortness of breath.  Wheezing.  CT ANGIOGRAPHY CHEST  Technique:  Multidetector CT imaging of the chest using the standard protocol during bolus administration of intravenous contrast. Multiplanar reconstructed images including MIPs were obtained and reviewed to evaluate the vascular anatomy.   Contrast:  2mL OMNIPAQUE IOHEXOL 350 MG/ML SOLN  Comparison: Chest x-ray dated 08/01/2012  Findings: There is a displaced fracture of the lateral aspect of the left sixth rib and a nondisplaced fracture of the lateral aspect of the left fifth rib. There is also a fracture of the anterior lateral aspect of the left seventh rib.  There is a 4.5 mm diameter cylindrical density in the lateral aspect of the right middle lobe, unchanged since prior CT scan dated 05/02/2003.  This could be a chronically impacted bronchus. This is not felt to be significant.  There is abnormal soft tissue prominence in the larynx just above the true cords asymmetric to the right with marked transverse narrowing of the airway.  This could represent a mass.  The area is incompletely visualized on the chest CT.  No visible adenopathy.  Heart size is normal. No infiltrates or effusions.  No pulmonary emboli.  Moderate hiatal hernia.  IMPRESSION:  1.  There appears to be a soft tissue mass in the larynx just above the true cords compressing the airway. 2.  Fractures of the lateral aspects of the left fifth, sixth, and seventh ribs. There may be a fracture of the anterior lateral aspect of the left eighth rib as well. 3.  No pulmonary emboli.   Original Report Authenticated By: Larey Seat, M.D.    Ir Gastrostomy Tube Mod Sed  08/17/2012  *RADIOLOGY REPORT*  PULL THROUGH GASTROSTOMY TUBE PLACEMENT UNDER FLUOROSCOPIC GUIDANCE  Date: 08/17/2012  Clinical History: 66 year old female with laryngeal cancer and dysphagia.  Percutaneous gastrostomy tube required for parenteral feeding during upcoming chemo and radiation therapy.  Procedures Performed:  1.  Fluoroscopically guided placement of percutaneous pull-through gastrostomy tube.  Interventional Radiologist:  Criselda Peaches, MD  Sedation: Moderate (conscious) sedation was used.  For mg Versed, 200 mcg Fentanyl were administered intravenously.  The patient's vital signs were monitored  continuously by radiology nursing throughout the procedure.  Sedation Time: 25 minutes  Fluoroscopy time: 3.4 minutes  Contrast volume: 15 ml Omnipaque 300 administered into the stomach  Antibiotics:  1g Ancef was administered intravenously within 1 hour of skin incision.  PROCEDURE/FINDINGS:   Informed consent was obtained from the patient following explanation of the procedure, risks, benefits and alternatives. The patient understands, agrees and consents for the procedure. All questions were addressed. A time out was performed.  Maximal barrier sterile technique utilized including caps, mask, sterile gowns, sterile gloves, large sterile drape, hand hygiene, and chlorhexadine skin prep.  An angled catheter was advanced over a wire under fluoroscopic guidance through the nose, down the esophagus and into the body of the stomach.  The stomach was then insufflated with several 100 ml of air.  Fluoroscopy confirmed location of the gastric bubble, as well as inferior displacement of the barium stained colon.  Under direct fluoroscopic guidance, a single T-tack was placed, and the anterior gastric wall drawn up against the anterior abdominal wall. Percutaneous access was then obtained into the mid gastric body with an 18 gauge sheath needle.  Aspiration of air, and injection of contrast material under fluoroscopy confirmed needle placement.  An Amplatz wire was advanced in the gastric body and the access needle exchanged  for a 9-French vascular sheath.  A snare device was advanced through the vascular sheath and an Amplatz wire advanced through the angled catheter.  The Amplatz wire was successfully snared and this was pulled up through the esophagus and out the mouth.  A 20-French Alinda Dooms MIC-PEG tube was then connected to the snare and pulled through the mouth, down the esophagus, into the stomach and out to the anterior abdominal wall. Hand injection of contrast material confirmed intragastric location. The  T-tack retention suture was then cut. The pull through peg tube was then secured with the external bumper and capped.  The patient will be observed for several hours with the newly placed tube on low wall suction to evaluate for any post procedure complication.  The patient tolerated the procedure well, there is no immediate complication.  IMPRESSION:  Successful placement of a 20 French pull through gastrostomy tube.  The tube will be ready for use 5,000,000 tomorrow (08/18/2012).  Signed,  Criselda Peaches, MD Vascular & Interventional Radiologist [facility]   Original Report Authenticated By: Myrle Sheng    Mr Neck Soft Tissue Only Wo Contrast  08/17/2012  *RADIOLOGY REPORT*  Clinical Data:  66 year old female with bulky supraglottic larynx mass.  Question paraglottic involvement.  Airway compromise, but now status post tracheostomy.  Despite multiple attempts, both supine and decubitus, the patient was unable to tolerate MRI imaging.  The patient refused a planned repeat attempt with conscious sedation.  MRI NECK WITHOUT CONTRAST  Technique:  Multiplanar, multiecho pulse sequences of the neck and surrounding structures were obtained without intravenous contrast.  Comparison:  CT neck with contrast 08/08/2012.  Findings:  Axial T2 and only several axial T1-weighted noncontrast images of the neck are provided.  The bulky right greater than left supraglottic mass is heterogeneously T2 hyperintense and hypointense on T1-weighted imaging.  Similar signal in the bulky right level II lymphadenopathy is noted.  On axial T1 image 6 of series 4 there is loss of the right paraglottic fat signal.  This is in the area at or just superior to the right thyroid cartilage.  IMPRESSION: 1.  The examination had to be discontinued prior to completion due to patient request. 2.  MRI evidence of right paraglottic soft tissue involvement by the supraglottic mass, favor T4 tumor stage.   Original Report Authenticated By: Randall An, M.D.    Nm Pet Image Initial (pi) Skull Base To Thigh  08/30/2012  *RADIOLOGY REPORT*  Clinical Data: Initial treatment strategy for head and neck. 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 CT data was obtained and used for attenuation correction and anatomic localization only.  (This was not acquired as a diagnostic CT examination.) Additional exam technical data entered on technologist worksheet.  Comparison:  Neck CT 08/08/2012  Findings:  Neck: There is an  intensely hypermetabolic mass centered within the posterior right aspect of the glottis with SUV max = [id number]  Hypermetabolic enlarged right level II lymph node measuring 18 mm (image 27) with SUV max = 7.5 consistent with local nodal metastasis.  No additional evidence of metastatic disease in the neck.  There is intense activity associated with tracheostomy site related to inflammation from recent surgery.  Chest:  Within the left chest wall,  there is focal intense hypermetabolic active associated with two adjacent fractures within the left lateral fifth  and sixth ribs (images 65 and 74 respectively).  There is mild callus formation at the fracture sites.  No hypermetabolic mediastinal lymph nodes.  There is small focus of atelectasis at the right lung base (image 89) with associated metabolic activity which is likely inflammatory or infectious.  No suspicious pulmonary nodules.  Abdomen/Pelvis:  No abnormal hypermetabolic activity within the liver, pancreas, adrenal glands, or spleen.  No hypermetabolic lymph nodes in the abdomen or pelvis.  Skeleton:  No focal hypermetabolic activity to suggest skeletal metastasis. Left rib fractures as above.  IMPRESSION:  1.  Intensely hypermetabolic primary carcinoma within the posterior right glottis. 2.  Local nodal metastasis to the right level II station. 3.  Intense  uptake in adjacent left lateral ribs fractures are in a traumatic pattern.  Cannot completely exclude pathologic fractures although not favored.  Recommend correlation with trauma. 4.  No evidence of systemic disease otherwise.  5.  Mild metabolic activity associated with right lower lobe atelectasis is likely infectious or inflammatory.   Original Report Authenticated By: Suzy Bouchard, M.D.    Dg Chest Port 1 View  09/01/2012  *RADIOLOGY REPORT*  Clinical Data: Follow-up tracheostomy placement  PORTABLE CHEST - 1 VIEW  Comparison: 08/31/2012  Findings: Surgical clips project over the neck. Tracheostomy tube not visualized. Mild bibasilar opacity.  Heart size upper normal. Trace left pleural effusion not excluded.  No pneumothorax.  No acute osseous finding.  IMPRESSION: Mild bibasilar opacities; atelectasis versus infiltrate.   Original Report Authenticated By: Suanne Marker, M.D.    Dg Chest Port 1 View  08/31/2012  *RADIOLOGY REPORT*  Clinical Data: Post laryngectomy.  PORTABLE CHEST - 1 VIEW  Comparison: 08/26/2012.  Findings: Biapical pleural thickening without associate bony destruction noted on the preoperative exam.  Heart size top normal.  Mild central pulmonary vascular prominence without pulmonary edema.  No pneumothorax or segmental infiltrate.  IMPRESSION: No acute abnormality.   Original Report Authenticated By: Doug Sou, M.D.    Dg Chest Port 1 View  08/10/2012  *RADIOLOGY REPORT*  Clinical Data: Status post tracheostomy.  PORTABLE CHEST - 1 VIEW  Comparison: CT of the chest 08/08/2012.  Findings: A tracheostomy has been placed.  The tip is at the level the clavicles, well above the carina.  The heart size is normal. New left basilar airspace disease likely reflects atelectasis.  The lung volumes are slightly decreased from prior study.  IMPRESSION:  1.  Interval placement of a tracheostomy tube without radiographic evidence for complication. 2.  New left basilar airspace  disease.  This likely reflects atelectasis.  Early infection or aspiration is not excluded.   Original Report Authenticated By: Resa Miner. MATTERN, M.D.    Dg Swallowing Func-no Report  08/16/2012  CLINICAL DATA: Eval swallowing per ST recs.   FLUOROSCOPY FOR SWALLOWING FUNCTION STUDY:  Fluoroscopy was provided for swallowing function study, which was  administered by a speech pathologist.  Final results and recommendations  from this study are contained within the speech pathology report.           Subjective: Patient is in good spirits today. She sat up for 6 hours and participating with physical therapy. She states that her pain is improving. Denies any vomiting, abdominal pain, headache, chest pain, shortness of breath, fevers, chills  Objective: Filed Vitals:   09/05/12 1319 09/05/12 1700 09/05/12 1821 09/05/12 2001  BP: 130/61  146/58   Pulse: 61 62 64 63  Temp: 98.1 F (36.7 C)  98.2  F (36.8 C)   TempSrc: Oral  Oral   Resp: 18 18 18 18   Height:      Weight:      SpO2: 99% 98% 96% 99%    Intake/Output Summary (Last 24 hours) at 09/05/12 2012 Last data filed at 09/05/12 1400  Gross per 24 hour  Intake   1245 ml  Output      3 ml  Net   1242 ml   Weight change:  Exam:   General:  Pt is alert, follows commands appropriately, not in acute distress  HEENT: No icterus, No thrush, No neck mass, [place]/AT  Cardiovascular: RRR, S1/S2, no rubs, no gallops  Respiratory: Clear to auscultation bilaterally, no wheezing, no crackles, no rhonchi  Abdomen: Soft/+BS, non tender, non distended, no guarding  Extremities: No edema, No lymphangitis, No petechiae, No rashes, no synovitis  Data Reviewed: Basic Metabolic Panel:  Lab [id number] 09/04/12 0515 09/03/12 0620 09/02/12 0430 09/01/12 0410  NA 139 138 137 139 137  K 5.1 4.4 4.3 4.2 4.4  CL 101 100 101 104 102  CO2 26 26 26 24 23   GLUCOSE 131* 117* 125* 146* 143*  BUN 22 20 17 18  36*  CREATININE 1.05 1.05 0.97  1.10 1.45*  CALCIUM 9.9 9.9 9.4 9.3 9.2  MG 1.9 -- 1.7 -- --  PHOS -- -- 3.0 -- --   Liver Function Tests: No results found for this basename: AST:5,ALT:5,ALKPHOS:5,BILITOT:5,PROT:5,ALBUMIN:5 in the last 168 hours No results found for this basename: LIPASE:5,AMYLASE:5 in the last 168 hours No results found for this basename: AMMONIA:5 in the last 168 hours CBC:  Lab 09/05/12 0705 09/04/12 0515 09/02/12 0430 09/01/12 0410 08/31/12 1419  WBC 10.9* 11.4* 10.2 8.6 --  NEUTROABS -- -- 7.7 -- --  HGB 10.8* 11.0* 10.3* 10.3* 7.8*  HCT 33.5* 33.6* 32.1* 30.6* 23.0*  MCV 85.7 85.5 86.1 82.3 --  PLT 295 308 289 282 --   Cardiac Enzymes: No results found for this basename: CKTOTAL:5,CKMB:5,CKMBINDEX:5,TROPONINI:5 in the last 168 hours BNP: No components found with this basename: POCBNP:5 CBG:  Lab 09/03/12 1745 09/03/12 1235 09/03/12 0750 09/03/12 0021 09/02/12 2021  GLUCAP 95 125* 113* 147* 146*    No results found for this or any previous visit (from the past 240 hour(s)).   Scheduled Meds:   . amitriptyline  10 mg Oral QHS  . amLODipine  2.5 mg Per Tube Daily  . antiseptic oral rinse  15 mL Mouth Rinse q12n4p  . atenolol  12.5 mg Per Tube BID  . bacitracin  1 application Topical Q000111Q  . chlorhexidine  15 mL Mouth Rinse BID  . docusate  100 mg Per Tube BID  . feeding supplement (OSMOLITE 1.2 CAL)  237 mL Per Tube Q6H  . fentaNYL  25 mcg Transdermal Q72H  . metoCLOPramide  5 mg Oral TID AC  . mirtazapine  15 mg Oral QHS  . neomycin-bacitracin-polymyxin  1 application Topical BID  . nicotine  21 mg Transdermal Daily  . ondansetron  4 mg Intravenous Q6H  . ranitidine  150 mg Per Tube Daily  . sennosides  5 mL Per Tube BID  . simvastatin  20 mg Oral q1800  . DISCONTD: lactulose  10 g Oral Q6H   Continuous Infusions:   . dextrose 5 % and 0.45 % NaCl with KCl 20 mEq/L 30 mL/hr at 09/04/12 2110     Harveen Flesch, DO  [facility] Pager [phone]  If 7PM-7AM, please  contact  night-coverage [email or web address] Password [email or web address] 09/05/2012, 8:12 PM   LOS: 5 days

## 2012-09-05 NOTE — Progress Notes (Signed)
   ENT Progress Note: POD #5 s/p Procedure(s): LARYNGECTOMY RADICAL NECK DISSECTION   Subjective: Improved pain  Objective: Vital signs in last 24 hours: Temp:  [97.4 F (36.3 C)-99.3 F (37.4 C)] 98 F (36.7 C) (09/23 1004) Pulse Rate:  [59-70] 59  (09/23 1155) Resp:  [16-20] 20  (09/23 1155) BP: (132-147)/(57-83) 137/57 mmHg (09/23 1004) SpO2:  [96 %-100 %] 96 % (09/23 1155) FiO2 (%):  [28 %] 28 % (09/23 1155) Weight change:  Last BM Date: 09/04/12  Intake/Output from previous day: 09/22 0701 - 09/23 0700 In: 1470 [I.V.:660] Out: 25 [Drains:25] Intake/Output this shift:    Labs:  [facility] 09/05/12 0705 09/04/12 0515  WBC 10.9* 11.4*  HGB 10.8* 11.0*  HCT 33.5* 33.6*  PLT 295 308    Basename 09/05/12 0705 09/04/12 0515  NA 139 138  K 5.1 4.4  CL 101 100  CO2 26 26  GLUCOSE 131* 117*  BUN 22 20  CALCIUM 9.9 9.9    Studies/Results: No results found.   PHYSICAL EXAM: Inc intact, no swelling JP #2 removed   Assessment/Plan: Pt stable Cont ambulation Home health consult for d/c Speech consult for voice assist    Bindu Docter 09/05/2012, 12:25 PM

## 2012-09-05 NOTE — Evaluation (Signed)
Speech Language Pathology Evaluation Patient Details Name: [name] MRN: [id number] DOB: November 18, 1946 Today's Date: 09/05/2012 Time: [id number] SLP Time Calculation (min): 47 min  Problem List:  Patient Active Problem List  Diagnosis  . Hypertension  . Anxiety  . Multiple closed fractures of ribs of left side  . 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 Tobacco abuse  . Hiatal hernia  . AKI (acute kidney injury)  . COPD (chronic obstructive pulmonary disease)  . Normocytic anemia  . Severe malnutrition secondary to acute illness  . Chronic pain syndrome   Past Medical History:  Past Medical History  Diagnosis Date  . Hypercholesteremia     takes Pravastatin daily  . Uterine cancer   . COPD (chronic obstructive pulmonary disease) 08/10/2012  . Hiatal hernia 08/10/2012  . 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 Hypertension     takes Tribenzor and Atenolol daily  . Stroke 2011  . Headache   . GERD (gastroesophageal reflux disease)     takes Zantac daily  . Chronic back pain   . Constipation     related to pain meds  . Nausea     takes Zofran prn  . Anxiety     takes Ativan prn  . Depression   . Insomnia     takes Amitriptyline daily  . Broken ribs    Past Surgical History:  Past Surgical History  Procedure Date  . Abdominal surgery     r/t uterine carcinoma  . Tracheostomy tube placement 08/10/2012    Procedure: TRACHEOSTOMY;  Surgeon: Jerrell Belfast, MD;  Location: [facility];  Service: ENT;  Laterality: N/A;  . Laryngoscopy 08/10/2012    Procedure: LARYNGOSCOPY;  Surgeon: Jerrell Belfast, MD;  Location: [facility];  Service: ENT;  Laterality: N/A;  with biopsy  . Appendectomy   . Hernia repair   . Gastrostomy w/ feeding tube     placed last week  . Laryngetomy 08/31/2012    Procedure: LARYNGECTOMY;  Surgeon: Jerrell Belfast, MD;  Location: [place];  Service: ENT;  Laterality: N/A;  . Radical neck dissection 08/31/2012   Procedure: RADICAL NECK DISSECTION;  Surgeon: Jerrell Belfast, MD;  Location: [facility];  Service: ENT;  Laterality: Bilateral;   HPI:  Patient now 5 days post-op for total laryngectomy secondary to stage 4  SCCa of subpraglottic area.  Patient will also require radiation to the neck.  PEG was placed for nutrition.   Assessment / Plan / Recommendation Clinical Impression  Patient now s/p total laryngectomy, therefore is aphonic.  She is also non-English speaking and relies totally on family for interpretation.  The patient is currently writing sentences to communicate her basic needs with the family, which is then translated to the staff.  Patient became tearful at the sound of the Artificial Larynx and asked "Is there anything else?"  SLP educated patient (via DIL) to various options of communication, including T-E puncture,  in which she was most interested.  Education to anatomical changes and how these affect function were discussed.  Information re: [employer] resources was provided, as was 2 types of shower shields.  SLP demonstrated the artificial Larynx and encouraged patient to attempt, but she declined, asking to try tomorrow.  She also declined the opportunity to keep the AL in the room and practice on her own, depite encouragement from the DIL and the SLP.  Patient is understandibly having difficulty adjusting to her new reality.    SLP Assessment  Patient  needs continued Speech Lanaguage Pathology Services    Follow Up Recommendations  Outpatient SLP    Frequency and Duration min 3x week  2 weeks   Pertinent Vitals/Pain n/a   SLP Goals  SLP Goals Potential to Achieve Goals: Good Potential Considerations:  (Emotional ramifications) SLP Goal #1: Patient will demonstrate ability to use the artificial larynx with max assist and 50% intelligibility for single-words. SLP Goal #2: Patient will intelligibly repeat functional phrases with max assist using the artificial larynx.  SLP  Evaluation Prior Functioning  Cognitive/Linguistic Baseline: Within functional limits Type of Home: House Lives With: Family;Son Available Help at Discharge: Family;Available 24 hours/day Vocation: Retired   Associate Professor  Overall Cognitive Status: Appears within functional limits for tasks assessed Arousal/Alertness: Awake/alert    Comprehension  Auditory Comprehension Overall Auditory Comprehension: Appears within functional limits for tasks assessed Yes/No Questions: Within Functional Limits Commands: Within Functional Limits Conversation: Complex Visual Recognition/Discrimination Discrimination: Not tested Reading Comprehension Reading Status: Not tested    Expression Expression Primary Mode of Expression: Nonverbal - written Verbal Expression Overall Verbal Expression: Impaired Non-Verbal Means of Communication: Gestures;Writing Written Expression Dominant Hand: Right Written Expression: Within Functional Limits   Oral / Motor Oral Motor/Sensory Function Overall Oral Motor/Sensory Function: Appears within functional limits for tasks assessed Labial ROM: Within Functional Limits Lingual ROM: Within Functional Limits Lingual Symmetry: Within Functional Limits Lingual Strength: Within Functional Limits Facial ROM: Within Functional Limits Facial Symmetry: Within Functional Limits Facial Strength: Within Functional Limits Facial Sensation: Within Functional Limits Mandible: Within Functional Limits Motor Speech Overall Motor Speech: Appears within functional limits for tasks assessed Phonation: Aphonic;Other (comment) (s/p total laryngectomy) Intelligibility: Unable to assess (comment) Motor Planning: Witnin functional limits Motor Speech Errors: Not applicable Interfering Components: Anatomical limitations Effective Techniques: Over-articulate   GO     Quinn Axe T 09/05/2012, 2:59 PM

## 2012-09-05 NOTE — Progress Notes (Signed)
Occupational Therapy Treatment Patient Details Name: [name] MRN: [id number] DOB: 1946/08/11 Today's Date: 09/05/2012 Time: [id number] OT Time Calculation (min): 32 min  OT Assessment / Plan / Recommendation Comments on Treatment Session This 66 yo female making progress since eval.    Follow Up Recommendations  No OT follow up       Equipment Recommendations  None recommended by OT       Frequency Min 2X/week   Plan Discharge plan remains appropriate    Precautions / Restrictions Precautions Precaution Comments: t collar mainly for moisture, JP drains in neck Restrictions Weight Bearing Restrictions: No   Pertinent Vitals/Pain 0/10 in neck at beginning, 8/10 post neck exercises    ADL  Lower Body Dressing: Performed;Set up;Supervision/safety (don socks ) Where Assessed - Lower Body Dressing: Unsupported sitting Toilet Transfer: Simulated;Min guard Toilet Transfer Method: Sit to Loss adjuster, chartered:  (Bed to recliner 3 feet away.) Equipment Used:  (None) Transfers/Ambulation Related to ADLs: Min guard A ADL Comments: Pt initally not wanting to get up, but daugher encouraged her to do so. Pt up to EOB with Min A (initially she said she could not do it). Asked pt to don her socks EOB and she said she could not--explained to her that I was not trying to make her mad or frustrated I just wanted her to try and get back to doing things for herself like she did before this all ocurred--she then donned her socks.       OT Goals Miscellaneous OT Goals OT Goal: Miscellaneous Goal #1 - Progress: Progressing toward goals OT Goal: Miscellaneous Goal #3 - Progress: Progressing toward goals  Visit Information  Last OT Received On: 09/05/12 Assistance Needed: +1    Subjective Data  Patient Stated Goal: Wants to know when she may be able to go home--per daughter      Cognition  Overall Cognitive Status: Appears within functional limits for tasks  assessed/performed Arousal/Alertness: Awake/alert Orientation Level: Appears intact for tasks assessed Behavior During Session: [facility] for tasks performed    Mobility  Shoulder Instructions Bed Mobility Bed Mobility: Supine to Sit;Sitting - Scoot to Edge of Bed Supine to Sit: 4: Min assist;HOB elevated (30 degrees) Sitting - Scoot to Edge of Bed: 5: Supervision Transfers Transfers: Sit to Stand;Stand to Sit Sit to Stand: 4: Min assist;With upper extremity assist;From bed Stand to Sit: 4: Min assist;With upper extremity assist;With armrests;To chair/3-in-1     Exercises  Other Exercises Other Exercises: Encouraged pt to do AROM of her neck for rotation and flex/extension. She did 10 reps of each with me. Had her daughter to explain that it would be good for her to do these 10 times each every waking hour.      End of Session OT - End of Session Equipment Utilized During Treatment:  (None) Activity Tolerance: Patient tolerated treatment well Patient left: in chair;with call bell/phone within reach;with family/visitor present (daughter) Nurse Communication: Mobility status (Pt can be up ambulating with staff)       Almon Register [id number] 09/05/2012, 11:30 AM

## 2012-09-06 LAB — BASIC METABOLIC PANEL
CO2: 27 mEq/L (ref 19–32)
Calcium: 10 mg/dL (ref 8.4–10.5)
Glucose, Bld: 105 mg/dL — ABNORMAL HIGH (ref 70–99)
Potassium: 5.1 mEq/L (ref 3.5–5.1)
Sodium: 138 mEq/L (ref 135–145)

## 2012-09-06 MED ORDER — FENTANYL 50 MCG/HR TD PT72
50.0000 ug | MEDICATED_PATCH | TRANSDERMAL | Status: DC
  Administered 2012-09-06 – 2012-09-12 (×3): 50 ug via TRANSDERMAL
  Filled 2012-09-06 (×4): qty 1

## 2012-09-06 MED ORDER — SODIUM CHLORIDE 0.9 % IV SOLN
INTRAVENOUS | Status: DC
  Administered 2012-09-06 – 2012-09-07 (×2): via INTRAVENOUS

## 2012-09-06 MED ORDER — DEXTROSE-NACL 5-0.45 % IV SOLN: INTRAVENOUS | Status: DC

## 2012-09-06 NOTE — Progress Notes (Signed)
Speech Language Pathology Treatment Patient Details Name: Zahari Tsosie MRN: [id number] DOB: 03/14/1946 Today's Date: 09/06/2012 Time:  [id number] (22 min)    Assessment / Plan / Recommendation                                                            F/u after yesterday's SLP assessment.  Dtr-in-law present.  Pt grieving loss of larynx/voice; refused trial of electrolarynx yest and refused again today.  Spoke with pt and dtr-in-law, affirmed feelings of loss and adjustment period.  Pt wants to pursue overall strength and use writing for communication at this time; amenable to trying EL at later date.  Assured pt we will be available for support/questions when she is ready.                SLP Goals   in progress    Mathieu Schloemer L. Tivis Ringer, [place] CCC/SLP Pager [phone]   Juan Quam Laurice 09/06/2012, 9:42 AM

## 2012-09-06 NOTE — Progress Notes (Signed)
FAMILY CONCERNED THAT PT  HAS STARTED HAVING RIGHT SHARP EAR PAIN. PAIN MED GIVEN AT THIS TIME

## 2012-09-06 NOTE — Progress Notes (Signed)
Nutrition Follow-up  Intervention:   1.  Enteral nutrition; if pt remains uncomfortable with 2 cans per bolus, recommend decreasing bolus volume to 360 mL (1.5 cans) administered 4 times daily for a total of 6 cans of Osmolite 1.2 which provides 1706 kcal, 78g protein, 1166 mL free water. If nausea persists, pt may benefit from even smaller boluses or returning to at least partial continuous feed. 2.  Recommend obtaining new wt via standing scale if possible.  Assessment:   RD met with pt and daughter at bedside.  Daughter states that pt is 'feeling full' after receiving a bolus of TF 1.5 hrs ago.  Pt has started regimen of zofran and reglan to improve nausea.   Education needs addressed with daughter.  Diet Order:  NPO  Meds: Scheduled Meds:   . amitriptyline  10 mg Oral QHS  . amLODipine  2.5 mg Per Tube Daily  . antiseptic oral rinse  15 mL Mouth Rinse q12n4p  . atenolol  12.5 mg Per Tube BID  . bacitracin  1 application Topical [id number]  . chlorhexidine  15 mL Mouth Rinse BID  . docusate  100 mg Per Tube BID  . feeding supplement (OSMOLITE 1.2 CAL)  474 mL Per Tube Q8H  . fentaNYL  25 mcg Transdermal Q72H  . metoCLOPramide  5 mg Oral Q8H  . mirtazapine  15 mg Oral QHS  . neomycin-bacitracin-polymyxin  1 application Topical BID  . nicotine  21 mg Transdermal Daily  . ondansetron  4 mg Intravenous Q6H  . ranitidine  150 mg Per Tube Daily  . sennosides  5 mL Per Tube BID  . simvastatin  20 mg Oral q1800  . DISCONTD: feeding supplement (OSMOLITE 1.2 CAL)  237 mL Per Tube Q6H  . DISCONTD: feeding supplement (OSMOLITE 1.2 CAL)  237 mL Per Tube Q8H  . DISCONTD: lactulose  10 g Oral Q6H  . DISCONTD: metoCLOPramide  5 mg Oral TID AC   Continuous Infusions:   . dextrose 5 % and 0.45 % NaCl with KCl 20 mEq/L 30 mL/hr (09/06/12 0656)   PRN Meds:.acetaminophen (TYLENOL) oral liquid 160 mg/5 mL, acetaminophen, albuterol, HYDROcodone-acetaminophen, LORazepam, morphine injection, ondansetron  (ZOFRAN) IV, ondansetron  Labs:  CMP     Component Value Date/Time   NA 138 09/06/2012 0545   K 5.1 09/06/2012 0545   CL 100 09/06/2012 0545   CO2 27 09/06/2012 0545   GLUCOSE 105* 09/06/2012 0545   BUN 23 09/06/2012 0545   CREATININE 1.14* 09/06/2012 0545   CALCIUM 10.0 09/06/2012 0545   PROT 6.1 08/10/2012 0303   ALBUMIN 3.2* 08/10/2012 0303   AST 16 08/10/2012 0303   ALT 9 08/10/2012 0303   ALKPHOS 82 08/10/2012 0303   BILITOT 0.3 08/10/2012 0303   GFRNONAA 49* 09/06/2012 0545   GFRAA 57* 09/06/2012 0545     Intake/Output Summary (Last 24 hours) at 09/06/12 1204 Last data filed at 09/06/12 [id number]  Gross per 24 hour  Intake    792 ml  Output    430 ml  Net    362 ml    Weight Status:  No new wt  Re-estimated needs:  1650-1850 kcal, 80-100g protein, 1.7-1.9 L/day fluid  Nutrition Dx:  Malnutrition, ongoing- Pt continues with severe malnutrition of chronic illness  Monitor:   1.  Energy intake; pt continuing to meet >90% of estimated needs 2.  Enteral nutrition; continued tolerance with improvement in nausea 3.  Wt/wt change; monitor trends.   Brynda Greathouse, MS  RD LDN Clinical Inpatient Dietitian Pager: [phone] Weekend/After hours pager: [phone]

## 2012-09-06 NOTE — Progress Notes (Addendum)
[facility] PROGRESS NOTE  [name] [id number] DOB: 1946-05-13 DOA: 08/31/2012 PCP: [name], MD  Assessment/Plan: Acute on chronic pain syndrome  -Patient has received 20 mg IV morphine in 24 hours, 30mg  hydrocodone -Increase fentanyl patch to 50 mcg (9/24) from 25 -Continue intermittent breakthrough medications  -Remeron and Elavil at bedtime  Hypertension  -Increase blood pressure likely due to pain -Continue atenolol, amlodipine  -Restart ARB, if blood pressure remains uncontrolled  Constipation  -improved with lactulose-->d/ced -continue Colace and Senokot  Malnutrition  -Prealbumin 20.8----will not add any additional supplementation  -Continue tube feeds Osmolite bolus feedings  -Change feedings to 2 can q. 8 hours (9/23)---Reglan one hour before -Patient is tolerating tube feeds at this time but complains of some nausea  -Zofran around-the-clock and Reglan at the time of bolus feed  -Remove potassium from [place] fluid as her potassium was started to creep up Hyperlipidemia  -Continue Zocor  Tobacco use, presumptive COPD  -Intermittent bronchodilators  -Continue Nicoderm patch Supraglottic (SCC) carcinoma (T4N1) -PET scan on September 17 showed hypermetabolic primary carcinoma in the posterior right glottis with local nodal metastasis; no other evidence of systemic disease -Dr. Bernell List Khan--oncology--to f/u as outpt after wound heals -Dr. Valere Dross --radiation oncology--XRT  When her wound healed     Family Communication:   sonat beside Disposition Plan:   Home when medically stable   Procedures:  For laryngectomy September 18     Procedures/Studies: Dg Chest 2 View  08/26/2012  *RADIOLOGY REPORT*  Clinical Data: Preop for laryngectomy  CHEST - 2 VIEW  Comparison: 08/10/2012  Findings: Cardiomediastinal silhouette is stable.  Tracheostomy tube is unchanged in position.  No acute infiltrate or pleural effusion.  No pulmonary edema. Bony thorax is  unremarkable.  IMPRESSION:  No active disease.  Stable tracheostomy tube position.   Original Report Authenticated By: Lahoma Crocker, M.D.    Ct Soft Tissue Neck W Contrast  08/08/2012  *RADIOLOGY REPORT*  Clinical Data: Laryngeal mass partially imaged on a chest CTA earlier today.  CT NECK WITH CONTRAST  Technique:  Multidetector CT imaging of the neck was performed with intravenous contrast.  Contrast: 26mL OMNIPAQUE IOHEXOL 300 MG/ML  SOLN  Comparison: Chest CTA obtained earlier today.  Findings: Again demonstrated is a large mass filling and expanding the larynx and hypopharynx above the level of the true cords.  This is involving the epiglottis and aryepiglottic folds and extending into the vallecula and piriform sinuses, greater on the right. This has a fluid density component on the left and is causing marked airway narrowing.  This mass measures 3.8 x 2.6 cm in maximum dimensions on image number 36 of series 8.  On coronal image number 49 of series 9, this measures 3.2 cm in length.  Also demonstrated is a bilobed or two adjacent lymph nodes with central low density in the lateral neck on the right, anterior to the sternocleidomastoid muscle.  This measures 1.8 x 1.7 cm in maximum dimensions on image number 55 of series 2 and 2.7 cm in length on coronal image number 46 of series 3.  No additional enlarged lymph nodes are identified.  Mild bullous changes are demonstrated at the lung apices with biapical pleural and parenchymal scarring.  The thyroid gland is unremarkable.  Cervical spine degenerative changes.  IMPRESSION:  1.  3.8 x 3.2 x 2.6 cm laryngeal mass above the level of the true cords, involving the aryepiglottic folds, epiglottis, piriform sinuses and vallecula.  This is necrotic on the left  and is causing marked airway narrowing.  This is most compatible with a primary squamous cell carcinoma. 2.  Necrotic metastatic adenopathy on the right, compatible with metastatic squamous cell carcinoma. 3.   COPD.   Original Report Authenticated By: Gerald Stabs, M.D.    Ct Angio Chest Pe W/cm &/or Wo Cm  08/08/2012  *RADIOLOGY REPORT*  Clinical Data: Shortness of breath.  Wheezing.  CT ANGIOGRAPHY CHEST  Technique:  Multidetector CT imaging of the chest using the standard protocol during bolus administration of intravenous contrast. Multiplanar reconstructed images including MIPs were obtained and reviewed to evaluate the vascular anatomy.  Contrast:  36mL OMNIPAQUE IOHEXOL 350 MG/ML SOLN  Comparison: Chest x-ray dated 08/01/2012  Findings: There is a displaced fracture of the lateral aspect of the left sixth rib and a nondisplaced fracture of the lateral aspect of the left fifth rib. There is also a fracture of the anterior lateral aspect of the left seventh rib.  There is a 4.5 mm diameter cylindrical density in the lateral aspect of the right middle lobe, unchanged since prior CT scan dated 05/02/2003.  This could be a chronically impacted bronchus. This is not felt to be significant.  There is abnormal soft tissue prominence in the larynx just above the true cords asymmetric to the right with marked transverse narrowing of the airway.  This could represent a mass.  The area is incompletely visualized on the chest CT.  No visible adenopathy.  Heart size is normal. No infiltrates or effusions.  No pulmonary emboli.  Moderate hiatal hernia.  IMPRESSION:  1.  There appears to be a soft tissue mass in the larynx just above the true cords compressing the airway. 2.  Fractures of the lateral aspects of the left fifth, sixth, and seventh ribs. There may be a fracture of the anterior lateral aspect of the left eighth rib as well. 3.  No pulmonary emboli.   Original Report Authenticated By: Larey Seat, M.D.    Ir Gastrostomy Tube Mod Sed  08/17/2012  *RADIOLOGY REPORT*  PULL THROUGH GASTROSTOMY TUBE PLACEMENT UNDER FLUOROSCOPIC GUIDANCE  Date: 08/17/2012  Clinical History: 66 year old female with laryngeal  cancer and dysphagia.  Percutaneous gastrostomy tube required for parenteral feeding during upcoming chemo and radiation therapy.  Procedures Performed:  1.  Fluoroscopically guided placement of percutaneous pull-through gastrostomy tube.  Interventional Radiologist:  Criselda Peaches, MD  Sedation: Moderate (conscious) sedation was used.  For mg Versed, 200 mcg Fentanyl were administered intravenously.  The patient's vital signs were monitored continuously by radiology nursing throughout the procedure.  Sedation Time: 25 minutes  Fluoroscopy time: 3.4 minutes  Contrast volume: 15 ml Omnipaque 300 administered into the stomach  Antibiotics:  1g Ancef was administered intravenously within 1 hour of skin incision.  PROCEDURE/FINDINGS:   Informed consent was obtained from the patient following explanation of the procedure, risks, benefits and alternatives. The patient understands, agrees and consents for the procedure. All questions were addressed. A time out was performed.  Maximal barrier sterile technique utilized including caps, mask, sterile gowns, sterile gloves, large sterile drape, hand hygiene, and chlorhexadine skin prep.  An angled catheter was advanced over a wire under fluoroscopic guidance through the nose, down the esophagus and into the body of the stomach.  The stomach was then insufflated with several 100 ml of air.  Fluoroscopy confirmed location of the gastric bubble, as well as inferior displacement of the barium stained colon.  Under direct fluoroscopic guidance, a single T-tack  was placed, and the anterior gastric wall drawn up against the anterior abdominal wall. Percutaneous access was then obtained into the mid gastric body with an 18 gauge sheath needle.  Aspiration of air, and injection of contrast material under fluoroscopy confirmed needle placement.  An Amplatz wire was advanced in the gastric body and the access needle exchanged for a 9-French vascular sheath.  A snare device was  advanced through the vascular sheath and an Amplatz wire advanced through the angled catheter.  The Amplatz wire was successfully snared and this was pulled up through the esophagus and out the mouth.  A 20-French Alinda Dooms MIC-PEG tube was then connected to the snare and pulled through the mouth, down the esophagus, into the stomach and out to the anterior abdominal wall. Hand injection of contrast material confirmed intragastric location. The T-tack retention suture was then cut. The pull through peg tube was then secured with the external bumper and capped.  The patient will be observed for several hours with the newly placed tube on low wall suction to evaluate for any post procedure complication.  The patient tolerated the procedure well, there is no immediate complication.  IMPRESSION:  Successful placement of a 20 French pull through gastrostomy tube.  The tube will be ready for use 5,000,000 tomorrow (08/18/2012).  Signed,  Criselda Peaches, MD Vascular & Interventional Radiologist [facility]   Original Report Authenticated By: Myrle Sheng    Mr Neck Soft Tissue Only Wo Contrast  08/17/2012  *RADIOLOGY REPORT*  Clinical Data:  66 year old female with bulky supraglottic larynx mass.  Question paraglottic involvement.  Airway compromise, but now status post tracheostomy.  Despite multiple attempts, both supine and decubitus, the patient was unable to tolerate MRI imaging.  The patient refused a planned repeat attempt with conscious sedation.  MRI NECK WITHOUT CONTRAST  Technique:  Multiplanar, multiecho pulse sequences of the neck and surrounding structures were obtained without intravenous contrast.  Comparison:  CT neck with contrast 08/08/2012.  Findings:  Axial T2 and only several axial T1-weighted noncontrast images of the neck are provided.  The bulky right greater than left supraglottic mass is heterogeneously T2 hyperintense and hypointense on T1-weighted imaging.  Similar signal in the  bulky right level II lymphadenopathy is noted.  On axial T1 image 6 of series 4 there is loss of the right paraglottic fat signal.  This is in the area at or just superior to the right thyroid cartilage.  IMPRESSION: 1.  The examination had to be discontinued prior to completion due to patient request. 2.  MRI evidence of right paraglottic soft tissue involvement by the supraglottic mass, favor T4 tumor stage.   Original Report Authenticated By: Randall An, M.D.    Nm Pet Image Initial (pi) Skull Base To Thigh  08/30/2012  *RADIOLOGY REPORT*  Clinical Data: Initial treatment strategy for head and neck. 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 CT data was obtained and used for attenuation correction and anatomic localization only.  (This was not acquired as a diagnostic CT examination.) Additional exam technical data entered on technologist worksheet.  Comparison:  Neck CT 08/08/2012  Findings:  Neck: There is an  intensely hypermetabolic mass centered within the posterior right aspect of the glottis with SUV max = [id number]  Hypermetabolic enlarged right level II lymph node measuring 18 mm (image 27) with  SUV max = 7.5 consistent with local nodal metastasis.  No additional evidence of metastatic disease in the neck.  There is intense activity associated with tracheostomy site related to inflammation from recent surgery.  Chest:  Within the left chest wall,  there is focal intense hypermetabolic active associated with two adjacent fractures within the left lateral fifth and sixth ribs (images 65 and 74 respectively).  There is mild callus formation at the fracture sites.  No hypermetabolic mediastinal lymph nodes.  There is small focus of atelectasis at the right lung base (image 89) with associated metabolic activity which is likely inflammatory or infectious.  No suspicious  pulmonary nodules.  Abdomen/Pelvis:  No abnormal hypermetabolic activity within the liver, pancreas, adrenal glands, or spleen.  No hypermetabolic lymph nodes in the abdomen or pelvis.  Skeleton:  No focal hypermetabolic activity to suggest skeletal metastasis. Left rib fractures as above.  IMPRESSION:  1.  Intensely hypermetabolic primary carcinoma within the posterior right glottis. 2.  Local nodal metastasis to the right level II station. 3.  Intense uptake in adjacent left lateral ribs fractures are in a traumatic pattern.  Cannot completely exclude pathologic fractures although not favored.  Recommend correlation with trauma. 4.  No evidence of systemic disease otherwise.  5.  Mild metabolic activity associated with right lower lobe atelectasis is likely infectious or inflammatory.   Original Report Authenticated By: Suzy Bouchard, M.D.    Dg Chest Port 1 View  09/01/2012  *RADIOLOGY REPORT*  Clinical Data: Follow-up tracheostomy placement  PORTABLE CHEST - 1 VIEW  Comparison: 08/31/2012  Findings: Surgical clips project over the neck. Tracheostomy tube not visualized. Mild bibasilar opacity.  Heart size upper normal. Trace left pleural effusion not excluded.  No pneumothorax.  No acute osseous finding.  IMPRESSION: Mild bibasilar opacities; atelectasis versus infiltrate.   Original Report Authenticated By: Suanne Marker, M.D.    Dg Chest Port 1 View  08/31/2012  *RADIOLOGY REPORT*  Clinical Data: Post laryngectomy.  PORTABLE CHEST - 1 VIEW  Comparison: 08/26/2012.  Findings: Biapical pleural thickening without associate bony destruction noted on the preoperative exam.  Heart size top normal.  Mild central pulmonary vascular prominence without pulmonary edema.  No pneumothorax or segmental infiltrate.  IMPRESSION: No acute abnormality.   Original Report Authenticated By: Doug Sou, M.D.    Dg Chest Port 1 View  08/10/2012  *RADIOLOGY REPORT*  Clinical Data: Status post tracheostomy.   PORTABLE CHEST - 1 VIEW  Comparison: CT of the chest 08/08/2012.  Findings: A tracheostomy has been placed.  The tip is at the level the clavicles, well above the carina.  The heart size is normal. New left basilar airspace disease likely reflects atelectasis.  The lung volumes are slightly decreased from prior study.  IMPRESSION:  1.  Interval placement of a tracheostomy tube without radiographic evidence for complication. 2.  New left basilar airspace disease.  This likely reflects atelectasis.  Early infection or aspiration is not excluded.   Original Report Authenticated By: Resa Miner. MATTERN, M.D.    Dg Swallowing Func-no Report  08/16/2012  CLINICAL DATA: Eval swallowing per ST recs.   FLUOROSCOPY FOR SWALLOWING FUNCTION STUDY:  Fluoroscopy was provided for swallowing function study, which was  administered by a speech pathologist.  Final results and recommendations  from this study are contained within the speech pathology report.           Subjective: Patient remains in good spirits. She complains of increased pain around her  neck today. No bleeding. No fevers, chills, chest pain, shortness breath, nausea, vomiting, diarrhea, abdominal pain. She tolerated the feeding schedule 2 cans every 8 hours.  Objective: Filed Vitals:   09/06/12 0855 09/06/12 1205 09/06/12 1400 09/06/12 1700  BP:   150/80   Pulse: 67 65 65   Temp:   98.1 F (36.7 C)   TempSrc:   Oral   Resp: 18 18 20    Height:      Weight:      SpO2: 99% 97% 99% 100%    Intake/Output Summary (Last 24 hours) at 09/06/12 1931 Last data filed at 09/06/12 1500  Gross per 24 hour  Intake    792 ml  Output    445 ml  Net    347 ml   Weight change:  Exam:   General:  Pt is alert, follows commands appropriately, not in acute distress  HEENT: No icterus, No thrush, No neck mass, [place]/AT  Cardiovascular: RRR, S1/S2, no rubs, no gallops  Respiratory: Clear to auscultation bilaterally, no wheezing, no crackles, no  rhonchi  Abdomen: Soft/+BS, non tender, non distended, no guarding; G-tube site without erythema or drainage  Extremities: No edema, No lymphangitis, No petechiae, No rashes, no synovitis  Data Reviewed: Basic Metabolic Panel:  Lab [id number] 0545 09/05/12 0705 09/04/12 0515 09/03/12 0620 09/02/12 0430  NA 138 139 138 137 139  K 5.1 5.1 4.4 4.3 4.2  CL 100 101 100 101 104  CO2 27 26 26 26 24   GLUCOSE 105* 131* 117* 125* 146*  BUN 23 22 20 17 18   CREATININE 1.14* 1.05 1.05 0.97 1.10  CALCIUM 10.0 9.9 9.9 9.4 9.3  MG -- 1.9 -- 1.7 --  PHOS -- -- -- 3.0 --   Liver Function Tests: No results found for this basename: AST:5,ALT:5,ALKPHOS:5,BILITOT:5,PROT:5,ALBUMIN:5 in the last 168 hours No results found for this basename: LIPASE:5,AMYLASE:5 in the last 168 hours No results found for this basename: AMMONIA:5 in the last 168 hours CBC:  Lab 09/05/12 0705 09/04/12 0515 09/02/12 0430 09/01/12 0410 08/31/12 1419  WBC 10.9* 11.4* 10.2 8.6 --  NEUTROABS -- -- 7.7 -- --  HGB 10.8* 11.0* 10.3* 10.3* 7.8*  HCT 33.5* 33.6* 32.1* 30.6* 23.0*  MCV 85.7 85.5 86.1 82.3 --  PLT 295 308 289 282 --   Cardiac Enzymes: No results found for this basename: CKTOTAL:5,CKMB:5,CKMBINDEX:5,TROPONINI:5 in the last 168 hours BNP: No components found with this basename: POCBNP:5 CBG:  Lab 09/03/12 1745 09/03/12 1235 09/03/12 0750 09/03/12 0021 09/02/12 2021  GLUCAP 95 125* 113* 147* 146*    No results found for this or any previous visit (from the past 240 hour(s)).   Scheduled Meds:   . amitriptyline  10 mg Oral QHS  . amLODipine  2.5 mg Per Tube Daily  . antiseptic oral rinse  15 mL Mouth Rinse q12n4p  . atenolol  12.5 mg Per Tube BID  . bacitracin  1 application Topical Q000111Q  . chlorhexidine  15 mL Mouth Rinse BID  . docusate  100 mg Per Tube BID  . feeding supplement (OSMOLITE 1.2 CAL)  474 mL Per Tube Q8H  . fentaNYL  50 mcg Transdermal Q72H  . metoCLOPramide  5 mg Oral Q8H  . mirtazapine   15 mg Oral QHS  . neomycin-bacitracin-polymyxin  1 application Topical BID  . nicotine  21 mg Transdermal Daily  . ondansetron  4 mg Intravenous Q6H  . ranitidine  150 mg Per Tube Daily  . sennosides  5 mL Per Tube BID  . simvastatin  20 mg Oral q1800  . DISCONTD: feeding supplement (OSMOLITE 1.2 CAL)  237 mL Per Tube Q6H  . DISCONTD: feeding supplement (OSMOLITE 1.2 CAL)  237 mL Per Tube Q8H  . DISCONTD: fentaNYL  25 mcg Transdermal Q72H  . DISCONTD: lactulose  10 g Oral Q6H  . DISCONTD: metoCLOPramide  5 mg Oral TID AC   Continuous Infusions:   . dextrose 5 % and 0.45 % NaCl with KCl 20 mEq/L 30 mL/hr (09/06/12 0656)     Rai Sinagra, DO  [facility] Pager [phone]  If 7PM-7AM, please contact night-coverage [email or web address] Password [facility] 09/06/2012, 7:31 PM   LOS: 6 days

## 2012-09-06 NOTE — Progress Notes (Signed)
   ENT Progress Note: POD #6  s/p Procedure(s): LARYNGECTOMY RADICAL NECK DISSECTION   Subjective: C/O pain   Objective: Vital signs in last 24 hours: Temp:  [98.1 F (36.7 C)-98.9 F (37.2 C)] 98.6 F (37 C) (09/24 0524) Pulse Rate:  [61-70] 67  (09/24 0855) Resp:  [17-18] 18  (09/24 0855) BP: (130-164)/(54-67) 135/54 mmHg (09/24 0524) SpO2:  [96 %-99 %] 99 % (09/24 0855) FiO2 (%):  [28 %] 28 % (09/24 0855) Weight change:  Last BM Date: 09/04/12  Intake/Output from previous day: 09/23 0701 - 09/24 0700 In: 792 [I.V.:702] Out: 430 [Urine:400; Drains:30] Intake/Output this shift:    Labs:  [facility] 09/05/12 0705 09/04/12 0515  WBC 10.9* 11.4*  HGB 10.8* 11.0*  HCT 33.5* 33.6*  PLT 295 308    Basename 09/06/12 0545 09/05/12 0705  NA 138 139  K 5.1 5.1  CL 100 101  CO2 27 26  GLUCOSE 105* 131*  BUN 23 22  CALCIUM 10.0 9.9    Studies/Results: No results found.   PHYSICAL EXAM: Inc intact, neck flaps are flat, no erythema or swelling Stoma stable Breath sounds clr   Assessment/Plan: Stable Cont current care Ambulate Plan po fluids 9/25, monitor JP outpt If cont current improvement, plan d/c for 9/30.    Renningers, Lauraine Crespo 09/06/2012, 12:13 PM

## 2012-09-07 DIAGNOSIS — G894 Chronic pain syndrome: Secondary | ICD-10-CM

## 2012-09-07 LAB — BASIC METABOLIC PANEL
Chloride: 102 mEq/L (ref 96–112)
GFR calc Af Amer: 62 mL/min — ABNORMAL LOW (ref >90)
GFR calc non Af Amer: 53 mL/min — ABNORMAL LOW (ref >90)
Glucose, Bld: 112 mg/dL — ABNORMAL HIGH (ref 70–99)
Potassium: 4.7 mEq/L (ref 3.5–5.1)
Sodium: 139 mEq/L (ref 135–145)

## 2012-09-07 LAB — CBC
HCT: 31.4 % — ABNORMAL LOW (ref 36.0–46.0)
Hemoglobin: 10.2 g/dL — ABNORMAL LOW (ref 12.0–15.0)
MCH: 28 pg (ref 26.0–34.0)
RBC: 3.64 MIL/uL — ABNORMAL LOW (ref 3.87–5.11)

## 2012-09-07 NOTE — Progress Notes (Signed)
   ENT Progress Note: POD #7  s/p Procedure(s): LARYNGECTOMY RADICAL NECK DISSECTION   Subjective: Pt stable, c/o cont pain  Objective: Vital signs in last 24 hours: Temp:  [97.6 F (36.4 C)-99.1 F (37.3 C)] 99.1 F (37.3 C) (09/25 0556) Pulse Rate:  [65-85] 69  (09/25 0132) Resp:  [16-20] 18  (09/25 0556) BP: (133-150)/(52-80) 147/57 mmHg (09/25 0556) SpO2:  [94 %-100 %] 100 % (09/25 0556) FiO2 (%):  [28 %] 28 % (09/24 1700) Weight:  [51.1 kg (112 lb 10.5 oz)] 51.1 kg (112 lb 10.5 oz) (09/25 0132) Weight change:  Last BM Date: 09/05/12  Intake/Output from previous day: 09/24 0701 - 09/25 0700 In: 240 [I.V.:240] Out: 440 [Urine:400; Drains:40] Intake/Output this shift:    Labs:  Basename 09/07/12 0532 09/05/12 0705  WBC 8.7 10.9*  HGB 10.2* 10.8*  HCT 31.4* 33.5*  PLT 307 295    Basename 09/07/12 0532 09/06/12 0545  NA 139 138  K 4.7 5.1  CL 102 100  CO2 28 27  GLUCOSE 112* 105*  BUN 26* 23  CALCIUM 9.3 10.0    Studies/Results: No results found.   PHYSICAL EXAM: Inc intact and flaps are flat, no erythema JP #1 removed Begin po fluids, monitor for leak   Assessment/Plan: Stable Cont current care Plan d/c 9/28 - 9/30, need home health care arrangements    Damonique Brunelle 09/07/2012, 10:02 AM

## 2012-09-07 NOTE — Progress Notes (Signed)
[facility]                                                                                Patient Demographics  [name], is a 66 y.o. female  [id number]  [id number]  DOB - Jan 20, 1946  Admit date - 08/31/2012  Admitting Physician Ann Belfast, MD  Outpatient Primary MD for the patient is Ann Gravel, MD  LOS - 7   No chief complaint on file.       Assessment & Plan   Acute on chronic pain syndrome  -Patient has received 20 mg IV morphine in 24 hours, 30mg  hydrocodone  -Increase fentanyl patch to 50 mcg (9/24) from 25  -Continue intermittent breakthrough medications  -Remeron and Elavil at bedtime     Hypertension  -Increase blood pressure likely due to pain  -Continue atenolol, amlodipine, better controlled today  -Restart ARB, if blood pressure remains uncontrolled    Constipation  -improved with lactulose-->d/ced  -continue Colace and Senokot    Malnutrition  -Prealbumin 20.8----will not add any additional supplementation  -Continue tube feeds Osmolite bolus feedings  -Change feedings to 2 can q. 8 hours (9/23)---Reglan one hour before  -Patient is tolerating tube feeds at this time but complains of some nausea  -Zofran around-the-clock and Reglan at the time of bolus feed  -Remove potassium from [place] fluid as her potassium was started to creep up  -Pressured consult   Hyperlipidemia  -Continue Zocor    Tobacco use, presumptive COPD  -Intermittent bronchodilators  -Continue Nicoderm patch    Supraglottic (SCC) carcinoma (T4N1)  -PET scan on September 17 showed hypermetabolic primary carcinoma in the posterior right glottis with local nodal metastasis; no other evidence of systemic disease  -Dr. Bernell List Shaw--oncology--to f/u as outpt after wound heals  -Dr. Valere Shaw --radiation oncology--XRT When her wound healed       Time Spent in minutes  35   Antibiotics     Anti-infectives     Start     Dose/Rate  Route Frequency Ordered Stop   08/31/12 2000   clindamycin (CLEOCIN) IVPB 600 mg        600 mg 100 mL/hr over 30 Minutes Intravenous 3 times per day 08/31/12 1903 09/01/12 1507   08/31/12 1045   clindamycin (CLEOCIN) IVPB 600 mg  Status:  Discontinued        600 mg 100 mL/hr over 30 Minutes Intravenous  Once 08/31/12 1032 08/31/12 1841          Scheduled Meds:   . amitriptyline  10 mg Oral QHS  . amLODipine  2.5 mg Per Tube Daily  . antiseptic oral rinse  15 mL Mouth Rinse q12n4p  . atenolol  12.5 mg Per Tube BID  . bacitracin  1 application Topical Q000111Q  . chlorhexidine  15 mL Mouth Rinse BID  . docusate  100 mg Per Tube BID  . feeding supplement (OSMOLITE 1.2 CAL)  474 mL Per Tube Q8H  . fentaNYL  50 mcg Transdermal Q72H  . metoCLOPramide  5 mg Oral Q8H  . mirtazapine  15 mg Oral QHS  . neomycin-bacitracin-polymyxin  1 application Topical BID  . nicotine  21 mg  Transdermal Daily  . ondansetron  4 mg Intravenous Q6H  . ranitidine  150 mg Per Tube Daily  . sennosides  5 mL Per Tube BID  . simvastatin  20 mg Oral q1800  . DISCONTD: fentaNYL  25 mcg Transdermal Q72H   Continuous Infusions:   . sodium chloride 100 mL/hr at 09/07/12 0529  . dextrose 5 % and 0.45% NaCl    . DISCONTD: dextrose 5 % and 0.45 % NaCl with KCl 20 mEq/L 30 mL/hr (09/06/12 0656)   PRN Meds:.acetaminophen (TYLENOL) oral liquid 160 mg/5 mL, acetaminophen, albuterol, HYDROcodone-acetaminophen, LORazepam, morphine injection, ondansetron (ZOFRAN) IV, ondansetron   DVT Prophylaxis  SCDs    [name] on 09/07/2012 at 12:10 PM  Between 7am to 7pm - Pager - [phone]  After 7pm go to [email or web address] - password [email or web address]  And look for the night coverage person covering for me after hours  [facility] Office  [phone]    Subjective:   [name] today has, No headache, No chest pain, No abdominal pain - No Nausea, No new weakness tingling or numbness, No Cough - SOB.     Objective:   Filed Vitals:   09/06/12 2058 09/06/12 2144 09/07/12 0132 09/07/12 0556  BP: 147/67 133/52 133/56 147/57  Pulse: 85 67 69   Temp: 97.6 F (36.4 C) 98.3 F (36.8 C) 98.6 F (37 C) 99.1 F (37.3 C)  TempSrc: Oral Oral Oral Oral  Resp: 18 16 18 18   Height:   5' (1.524 m)   Weight:   51.1 kg (112 lb 10.5 oz)   SpO2: 94% 94% 100% 100%    Wt Readings from Last 3 Encounters:  09/07/12 51.1 kg (112 lb 10.5 oz)  09/07/12 51.1 kg (112 lb 10.5 oz)  08/14/12 51 kg (112 lb 7 oz)     Intake/Output Summary (Last 24 hours) at 09/07/12 1210 Last data filed at 09/07/12 0558  Gross per 24 hour  Intake    240 ml  Output    440 ml  Net   -200 ml    Exam Awake Alert,  , No new F.N deficits, Normal affect [place].AT,PERRAL, trach site appears clear Supple Neck,No JVD, No cervical lymphadenopathy appriciated.  Symmetrical Chest wall movement, Good air movement bilaterally, CTAB RRR,No Gallops,Rubs or new Murmurs, No Parasternal Heave +ve B.Sounds, Abd Soft, Non tender, No organomegaly appriciated, No rebound - guarding or rigidity. No Cyanosis, Clubbing or edema, No new Rash or bruise      Data Review   Micro Results No results found for this or any previous visit (from the past 240 hour(s)).  Radiology Reports Dg Chest 2 View  08/26/2012  *RADIOLOGY REPORT*  Clinical Data: Preop for laryngectomy  CHEST - 2 VIEW  Comparison: 08/10/2012  Findings: Cardiomediastinal silhouette is stable.  Tracheostomy tube is unchanged in position.  No acute infiltrate or pleural effusion.  No pulmonary edema. Bony thorax is unremarkable.  IMPRESSION:  No active disease.  Stable tracheostomy tube position.   Original Report Authenticated By: [name], Shaw.    Ct Soft Tissue Neck W Contrast  08/08/2012  *RADIOLOGY REPORT*  Clinical Data: Laryngeal mass partially imaged on a chest CTA earlier today.  CT NECK WITH CONTRAST  Technique:  Multidetector CT imaging of the neck was performed with  intravenous contrast.  Contrast: 55mL OMNIPAQUE IOHEXOL 300 MG/ML  SOLN  Comparison: Chest CTA obtained earlier today.  Findings: Again demonstrated is a large mass filling and expanding the larynx and  hypopharynx above the level of the true cords.  This is involving the epiglottis and aryepiglottic folds and extending into the vallecula and piriform sinuses, greater on the right. This has a fluid density component on the left and is causing marked airway narrowing.  This mass measures 3.8 x 2.6 cm in maximum dimensions on image number 36 of series 8.  On coronal image number 49 of series 9, this measures 3.2 cm in length.  Also demonstrated is a bilobed or two adjacent lymph nodes with central low density in the lateral neck on the right, anterior to the sternocleidomastoid muscle.  This measures 1.8 x 1.7 cm in maximum dimensions on image number 55 of series 2 and 2.7 cm in length on coronal image number 46 of series 3.  No additional enlarged lymph nodes are identified.  Mild bullous changes are demonstrated at the lung apices with biapical pleural and parenchymal scarring.  The thyroid gland is unremarkable.  Cervical spine degenerative changes.  IMPRESSION:  1.  3.8 x 3.2 x 2.6 cm laryngeal mass above the level of the true cords, involving the aryepiglottic folds, epiglottis, piriform sinuses and vallecula.  This is necrotic on the left and is causing marked airway narrowing.  This is most compatible with a primary squamous cell carcinoma. 2.  Necrotic metastatic adenopathy on the right, compatible with metastatic squamous cell carcinoma. 3.  COPD.   Original Report Authenticated By: Gerald Stabs, Shaw.    Ct Angio Chest Pe W/cm &/or Wo Cm  08/08/2012  *RADIOLOGY REPORT*  Clinical Data: Shortness of breath.  Wheezing.  CT ANGIOGRAPHY CHEST  Technique:  Multidetector CT imaging of the chest using the standard protocol during bolus administration of intravenous contrast. Multiplanar reconstructed images  including MIPs were obtained and reviewed to evaluate the vascular anatomy.  Contrast:  30mL OMNIPAQUE IOHEXOL 350 MG/ML SOLN  Comparison: Chest x-ray dated 08/01/2012  Findings: There is a displaced fracture of the lateral aspect of the left sixth rib and a nondisplaced fracture of the lateral aspect of the left fifth rib. There is also a fracture of the anterior lateral aspect of the left seventh rib.  There is a 4.5 mm diameter cylindrical density in the lateral aspect of the right middle lobe, unchanged since prior CT scan dated 05/02/2003.  This could be a chronically impacted bronchus. This is not felt to be significant.  There is abnormal soft tissue prominence in the larynx just above the true cords asymmetric to the right with marked transverse narrowing of the airway.  This could represent a mass.  The area is incompletely visualized on the chest CT.  No visible adenopathy.  Heart size is normal. No infiltrates or effusions.  No pulmonary emboli.  Moderate hiatal hernia.  IMPRESSION:  1.  There appears to be a soft tissue mass in the larynx just above the true cords compressing the airway. 2.  Fractures of the lateral aspects of the left fifth, sixth, and seventh ribs. There may be a fracture of the anterior lateral aspect of the left eighth rib as well. 3.  No pulmonary emboli.   Original Report Authenticated By: Larey Seat, Shaw.    Ir Gastrostomy Tube Mod Sed  08/17/2012  *RADIOLOGY REPORT*  PULL THROUGH GASTROSTOMY TUBE PLACEMENT UNDER FLUOROSCOPIC GUIDANCE  Date: 08/17/2012  Clinical History: 66 year old female with laryngeal cancer and dysphagia.  Percutaneous gastrostomy tube required for parenteral feeding during upcoming chemo and radiation therapy.  Procedures Performed:  1.  Fluoroscopically  guided placement of percutaneous pull-through gastrostomy tube.  Interventional Radiologist:  Criselda Peaches, MD  Sedation: Moderate (conscious) sedation was used.  For mg Versed, 200 mcg  Fentanyl were administered intravenously.  The patient's vital signs were monitored continuously by radiology nursing throughout the procedure.  Sedation Time: 25 minutes  Fluoroscopy time: 3.4 minutes  Contrast volume: 15 ml Omnipaque 300 administered into the stomach  Antibiotics:  1g Ancef was administered intravenously within 1 hour of skin incision.  PROCEDURE/FINDINGS:   Informed consent was obtained from the patient following explanation of the procedure, risks, benefits and alternatives. The patient understands, agrees and consents for the procedure. All questions were addressed. A time out was performed.  Maximal barrier sterile technique utilized including caps, mask, sterile gowns, sterile gloves, large sterile drape, hand hygiene, and chlorhexadine skin prep.  An angled catheter was advanced over a wire under fluoroscopic guidance through the nose, down the esophagus and into the body of the stomach.  The stomach was then insufflated with several 100 ml of air.  Fluoroscopy confirmed location of the gastric bubble, as well as inferior displacement of the barium stained colon.  Under direct fluoroscopic guidance, a single T-tack was placed, and the anterior gastric wall drawn up against the anterior abdominal wall. Percutaneous access was then obtained into the mid gastric body with an 18 gauge sheath needle.  Aspiration of air, and injection of contrast material under fluoroscopy confirmed needle placement.  An Amplatz wire was advanced in the gastric body and the access needle exchanged for a 9-French vascular sheath.  A snare device was advanced through the vascular sheath and an Amplatz wire advanced through the angled catheter.  The Amplatz wire was successfully snared and this was pulled up through the esophagus and out the mouth.  A 20-French Alinda Dooms MIC-PEG tube was then connected to the snare and pulled through the mouth, down the esophagus, into the stomach and out to the anterior  abdominal wall. Hand injection of contrast material confirmed intragastric location. The T-tack retention suture was then cut. The pull through peg tube was then secured with the external bumper and capped.  The patient will be observed for several hours with the newly placed tube on low wall suction to evaluate for any post procedure complication.  The patient tolerated the procedure well, there is no immediate complication.  IMPRESSION:  Successful placement of a 20 French pull through gastrostomy tube.  The tube will be ready for use 5,000,000 tomorrow (08/18/2012).  Signed,  Criselda Peaches, MD Vascular & Interventional Radiologist [facility]   Original Report Authenticated By: Myrle Sheng    Mr Neck Soft Tissue Only Wo Contrast  08/17/2012  *RADIOLOGY REPORT*  Clinical Data:  66 year old female with bulky supraglottic larynx mass.  Question paraglottic involvement.  Airway compromise, but now status post tracheostomy.  Despite multiple attempts, both supine and decubitus, the patient was unable to tolerate MRI imaging.  The patient refused a planned repeat attempt with conscious sedation.  MRI NECK WITHOUT CONTRAST  Technique:  Multiplanar, multiecho pulse sequences of the neck and surrounding structures were obtained without intravenous contrast.  Comparison:  CT neck with contrast 08/08/2012.  Findings:  Axial T2 and only several axial T1-weighted noncontrast images of the neck are provided.  The bulky right greater than left supraglottic mass is heterogeneously T2 hyperintense and hypointense on T1-weighted imaging.  Similar signal in the bulky right level II lymphadenopathy is noted.  On axial T1 image 6 of series  4 there is loss of the right paraglottic fat signal.  This is in the area at or just superior to the right thyroid cartilage.  IMPRESSION: 1.  The examination had to be discontinued prior to completion due to patient request. 2.  MRI evidence of right paraglottic soft tissue involvement by  the supraglottic mass, favor T4 tumor stage.   Original Report Authenticated By: Randall An, Shaw.    Nm Pet Image Initial (pi) Skull Base To Thigh  08/30/2012  *RADIOLOGY REPORT*  Clinical Data: Initial treatment strategy for head and neck. 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 CT data was obtained and used for attenuation correction and anatomic localization only.  (This was not acquired as a diagnostic CT examination.) Additional exam technical data entered on technologist worksheet.  Comparison:  Neck CT 08/08/2012  Findings:  Neck: There is an  intensely hypermetabolic mass centered within the posterior right aspect of the glottis with SUV max = [id number]  Hypermetabolic enlarged right level II lymph node measuring 18 mm (image 27) with SUV max = 7.5 consistent with local nodal metastasis.  No additional evidence of metastatic disease in the neck.  There is intense activity associated with tracheostomy site related to inflammation from recent surgery.  Chest:  Within the left chest wall,  there is focal intense hypermetabolic active associated with two adjacent fractures within the left lateral fifth and sixth ribs (images 65 and 74 respectively).  There is mild callus formation at the fracture sites.  No hypermetabolic mediastinal lymph nodes.  There is small focus of atelectasis at the right lung base (image 89) with associated metabolic activity which is likely inflammatory or infectious.  No suspicious pulmonary nodules.  Abdomen/Pelvis:  No abnormal hypermetabolic activity within the liver, pancreas, adrenal glands, or spleen.  No hypermetabolic lymph nodes in the abdomen or pelvis.  Skeleton:  No focal hypermetabolic activity to suggest skeletal metastasis. Left rib fractures as above.  IMPRESSION:  1.  Intensely hypermetabolic primary carcinoma within the  posterior right glottis. 2.  Local nodal metastasis to the right level II station. 3.  Intense uptake in adjacent left lateral ribs fractures are in a traumatic pattern.  Cannot completely exclude pathologic fractures although not favored.  Recommend correlation with trauma. 4.  No evidence of systemic disease otherwise.  5.  Mild metabolic activity associated with right lower lobe atelectasis is likely infectious or inflammatory.   Original Report Authenticated By: Suzy Bouchard, Shaw.    Dg Chest Port 1 View  09/01/2012  *RADIOLOGY REPORT*  Clinical Data: Follow-up tracheostomy placement  PORTABLE CHEST - 1 VIEW  Comparison: 08/31/2012  Findings: Surgical clips project over the neck. Tracheostomy tube not visualized. Mild bibasilar opacity.  Heart size upper normal. Trace left pleural effusion not excluded.  No pneumothorax.  No acute osseous finding.  IMPRESSION: Mild bibasilar opacities; atelectasis versus infiltrate.   Original Report Authenticated By: Suanne Marker, Shaw.    Dg Chest Port 1 View  08/31/2012  *RADIOLOGY REPORT*  Clinical Data: Post laryngectomy.  PORTABLE CHEST - 1 VIEW  Comparison: 08/26/2012.  Findings: Biapical pleural thickening without associate bony destruction noted on the preoperative exam.  Heart size top normal.  Mild central pulmonary vascular prominence without pulmonary edema.  No pneumothorax or segmental infiltrate.  IMPRESSION: No acute abnormality.   Original Report Authenticated By: Remo Lipps  R. Jeannine Kitten, Shaw.    Dg Chest Port 1 View  08/10/2012  *RADIOLOGY REPORT*  Clinical Data: Status post tracheostomy.  PORTABLE CHEST - 1 VIEW  Comparison: CT of the chest 08/08/2012.  Findings: A tracheostomy has been placed.  The tip is at the level the clavicles, well above the carina.  The heart size is normal. New left basilar airspace disease likely reflects atelectasis.  The lung volumes are slightly decreased from prior study.  IMPRESSION:  1.  Interval placement of a  tracheostomy tube without radiographic evidence for complication. 2.  New left basilar airspace disease.  This likely reflects atelectasis.  Early infection or aspiration is not excluded.   Original Report Authenticated By: Resa Miner. MATTERN, Shaw.    Dg Swallowing Func-no Report  08/16/2012  CLINICAL DATA: Eval swallowing per ST recs.   FLUOROSCOPY FOR SWALLOWING FUNCTION STUDY:  Fluoroscopy was provided for swallowing function study, which was  administered by a speech pathologist.  Final results and recommendations  from this study are contained within the speech pathology report.      CBC  Lab 09/07/12 0532 09/05/12 0705 09/04/12 0515 09/02/12 0430 09/01/12 0410  WBC 8.7 10.9* 11.4* 10.2 8.6  HGB 10.2* 10.8* 11.0* 10.3* 10.3*  HCT 31.4* 33.5* 33.6* 32.1* 30.6*  PLT 307 295 308 289 282  MCV 86.3 85.7 85.5 86.1 82.3  MCH 28.0 27.6 28.0 27.6 27.7  MCHC 32.5 32.2 32.7 32.1 33.7  RDW 13.5 13.7 13.8 13.8 13.4  LYMPHSABS -- -- -- 1.4 --  MONOABS -- -- -- 1.0 --  EOSABS -- -- -- 0.1 --  BASOSABS -- -- -- 0.0 --  BANDABS -- -- -- -- --    Chemistries   Lab 09/07/12 0532 09/06/12 0545 09/05/12 0705 09/04/12 0515 09/03/12 0620  NA 139 138 139 138 137  K 4.7 5.1 5.1 4.4 4.3  CL 102 100 101 100 101  CO2 28 27 26 26 26   GLUCOSE 112* 105* 131* 117* 125*  BUN 26* 23 22 20 17   CREATININE 1.06 1.14* 1.05 1.05 0.97  CALCIUM 9.3 10.0 9.9 9.9 9.4  MG -- -- 1.9 -- 1.7  AST -- -- -- -- --  ALT -- -- -- -- --  ALKPHOS -- -- -- -- --  BILITOT -- -- -- -- --   ------------------------------------------------------------------------------------------------------------------ estimated creatinine clearance is 37.5 ml/min (by C-G formula based on Cr of 1.06). ------------------------------------------------------------------------------------------------------------------ No results found for this basename: HGBA1C:2 in the last 72  hours ------------------------------------------------------------------------------------------------------------------ No results found for this basename: CHOL:2,HDL:2,LDLCALC:2,TRIG:2,CHOLHDL:2,LDLDIRECT:2 in the last 72 hours ------------------------------------------------------------------------------------------------------------------ No results found for this basename: TSH,T4TOTAL,FREET3,T3FREE,THYROIDAB in the last 72 hours ------------------------------------------------------------------------------------------------------------------ No results found for this basename: VITAMINB12:2,FOLATE:2,FERRITIN:2,TIBC:2,IRON:2,RETICCTPCT:2 in the last 72 hours  Coagulation profile No results found for this basename: INR:5,PROTIME:5 in the last 168 hours  No results found for this basename: DDIMER:2 in the last 72 hours  Cardiac Enzymes No results found for this basename: CK:3,CKMB:3,TROPONINI:3,MYOGLOBIN:3 in the last 168 hours ------------------------------------------------------------------------------------------------------------------ No components found with this basename: POCBNP:3

## 2012-09-07 NOTE — Progress Notes (Signed)
Brief Nutrition Note  Received consult for TF initiation and management.  RD is following.  See progress notes for details.  Patient is tolerating bolus TF well.  No changes to TF regimen needed at this time.  RD to continue to follow for TF management.  Molli Barrows, RD, LDN, [place] Pager# [phone] After Hours Pager# [phone]

## 2012-09-08 ENCOUNTER — Encounter (HOSPITAL_COMMUNITY): Payer: Self-pay | Admitting: General Practice

## 2012-09-08 LAB — COMPREHENSIVE METABOLIC PANEL
BUN: 27 mg/dL — ABNORMAL HIGH (ref 6–23)
CO2: 25 mEq/L (ref 19–32)
Chloride: 100 mEq/L (ref 96–112)
Creatinine, Ser: 0.93 mg/dL (ref 0.50–1.10)
GFR calc Af Amer: 73 mL/min — ABNORMAL LOW (ref >90)
GFR calc non Af Amer: 63 mL/min — ABNORMAL LOW (ref >90)
Total Bilirubin: 0.2 mg/dL — ABNORMAL LOW (ref 0.3–1.2)

## 2012-09-08 LAB — CBC
HCT: 32.1 % — ABNORMAL LOW (ref 36.0–46.0)
MCH: 27.8 pg (ref 26.0–34.0)
MCV: 85.8 fL (ref 78.0–100.0)
RBC: 3.74 MIL/uL — ABNORMAL LOW (ref 3.87–5.11)
WBC: 9.6 10*3/uL (ref 4.0–10.5)

## 2012-09-08 NOTE — Progress Notes (Signed)
Nutrition Follow-up  Intervention:   1.  General healthful diet;  Discussed current nutrition status and progress with pt and daughter.  Pt with questions about wt gain which are answered.  Provided pt with encouragement on progress and noted improvement in nutrition status as well as PO intake on clear liquids.  Pt is pleased that she has stopped losing wt.  Assessment:   RD followed up with pt and family re:  Nutrition status.  Pt continues Osmolite 1.2, 2 cans TID which provides 1706 kcal, 78g protein, and 1166 mL free water.  Pt states she is much more comfortable with regimen now that she has started reglan. Pt started trial of clear liquids today with tolerance.  Diet Order:  Clear liquids  Meds: Scheduled Meds:   . amitriptyline  10 mg Oral QHS  . amLODipine  2.5 mg Per Tube Daily  . antiseptic oral rinse  15 mL Mouth Rinse q12n4p  . atenolol  12.5 mg Per Tube BID  . bacitracin  1 application Topical Q000111Q  . chlorhexidine  15 mL Mouth Rinse BID  . docusate  100 mg Per Tube BID  . feeding supplement (OSMOLITE 1.2 CAL)  474 mL Per Tube Q8H  . fentaNYL  50 mcg Transdermal Q72H  . metoCLOPramide  5 mg Oral Q8H  . mirtazapine  15 mg Oral QHS  . neomycin-bacitracin-polymyxin  1 application Topical BID  . nicotine  21 mg Transdermal Daily  . ondansetron  4 mg Intravenous Q6H  . ranitidine  150 mg Per Tube Daily  . sennosides  5 mL Per Tube BID  . simvastatin  20 mg Oral q1800   Continuous Infusions:  PRN Meds:.acetaminophen (TYLENOL) oral liquid 160 mg/5 mL, acetaminophen, albuterol, HYDROcodone-acetaminophen, LORazepam, morphine injection, ondansetron (ZOFRAN) IV, ondansetron  Labs:  CMP     Component Value Date/Time   NA 139 09/08/2012 0610   K 4.8 09/08/2012 0610   CL 100 09/08/2012 0610   CO2 25 09/08/2012 0610   GLUCOSE 111* 09/08/2012 0610   BUN 27* 09/08/2012 0610   CREATININE 0.93 09/08/2012 0610   CALCIUM 9.6 09/08/2012 0610   PROT 6.6 09/08/2012 0610   ALBUMIN 2.9*  09/08/2012 0610   AST 23 09/08/2012 0610   ALT 22 09/08/2012 0610   ALKPHOS 88 09/08/2012 0610   BILITOT 0.2* 09/08/2012 0610   GFRNONAA 63* 09/08/2012 0610   GFRAA 73* 09/08/2012 0610     Intake/Output Summary (Last 24 hours) at 09/08/12 1645 Last data filed at 09/08/12 0533  Gross per 24 hour  Intake    574 ml  Output     45 ml  Net    529 ml    Weight Status:  112 lbs, improved  Re-estimated needs:  1650-1850 kcal, 80-100g protein, 1.7-1.9 L/day  Nutrition Dx:  Malnutrition, ongoing.   Pt continues with severe malnutrition of chronic illness.  Monitor:   1. Energy intake; pt continuing to meet >90% of estimated needs.  Met, ongoing. 2. Enteral nutrition; continued tolerance with improvement in nausea.  Met, pt with improvement in nausea 3. Wt/wt change; monitor trends.  Met, continue.   Brynda Greathouse, MS RD LDN Clinical Inpatient Dietitian Pager: [phone] Weekend/After hours pager: [phone]

## 2012-09-08 NOTE — Progress Notes (Signed)
Speech Language Pathology Treatment Patient Details Name: [name] MRN: [id number] DOB: 05/28/1946 Today's Date: 09/08/2012 Time: [id number] (25 minutes)    Assessment / Plan / Recommendation Clinical Impression    F/u for post-laryngectomee education.  Provided pt/dtr-in-law with resources for information and support ([employer] and [place] chapter of [employer]); provided encouragement.  Pt politely declines use of electrolarynx and is unlikely to pursue that method of communication.  Recommend they request OPSLP orders for assistance when pt is ready to pursue alternative methods of communication. Will D/C SLP services at this time.  SLP Plan       Pertinent Vitals/Pain Pain in ear- RN providing meds  SLP Goals   not met - pt declines EL    Lorianna Spadaccini L. Tivis Ringer, [place] CCC/SLP Pager [phone]   Juan Quam Laurice 09/08/2012, 12:32 PM

## 2012-09-08 NOTE — Progress Notes (Signed)
[facility]                                                                                Patient Demographics  [name], is a 66 y.o. female  [id number]  [id number]  DOB - 1946/09/19  Admit date - 08/31/2012  Admitting Physician Ann Belfast, MD  Outpatient Primary MD for the patient is Ann Gravel, MD  LOS - 8   No chief complaint on file.       Assessment & Plan   Acute on chronic pain syndrome  -Patient is comfortable on present pain regimen along with Remeron and Elavil at bedtime.    Hypertension  -Increase blood pressure likely due to pain  -Continue atenolol, amlodipine, better controlled today, no further change   Constipation  -improved with lactulose-->d/ced  -continue Colace and Senokot    Malnutrition  -Prealbumin 20.8--nutritional consult noted continue present in the feed regimen   Hyperlipidemia  -Continue Zocor    Tobacco use, presumptive COPD  -Intermittent bronchodilators  -Continue Nicoderm patch    Supraglottic (SCC) carcinoma (T4N1)  It is post laryngectomy, plan per primary team which is ENT, being seen by speech. -PET scan on September 17 showed hypermetabolic primary carcinoma in the posterior right glottis with local nodal metastasis; no other evidence of systemic disease  -Dr. Bernell List Shaw--oncology--to f/u as outpt after wound heals  -Dr. Valere Shaw --radiation oncology--XRT When her wound healed       Time Spent in minutes  35   Antibiotics     Anti-infectives     Start     Dose/Rate Route Frequency Ordered Stop   08/31/12 2000   clindamycin (CLEOCIN) IVPB 600 mg        600 mg 100 mL/hr over 30 Minutes Intravenous 3 times per day 08/31/12 1903 09/01/12 1507   08/31/12 1045   clindamycin (CLEOCIN) IVPB 600 mg  Status:  Discontinued        600 mg 100 mL/hr over 30 Minutes Intravenous  Once 08/31/12 1032 08/31/12 1841          Scheduled Meds:    . amitriptyline  10 mg Oral  QHS  . amLODipine  2.5 mg Per Tube Daily  . antiseptic oral rinse  15 mL Mouth Rinse q12n4p  . atenolol  12.5 mg Per Tube BID  . bacitracin  1 application Topical [id number]  . chlorhexidine  15 mL Mouth Rinse BID  . docusate  100 mg Per Tube BID  . feeding supplement (OSMOLITE 1.2 CAL)  474 mL Per Tube Q8H  . fentaNYL  50 mcg Transdermal Q72H  . metoCLOPramide  5 mg Oral Q8H  . mirtazapine  15 mg Oral QHS  . neomycin-bacitracin-polymyxin  1 application Topical BID  . nicotine  21 mg Transdermal Daily  . ondansetron  4 mg Intravenous Q6H  . ranitidine  150 mg Per Tube Daily  . sennosides  5 mL Per Tube BID  . simvastatin  20 mg Oral q1800   Continuous Infusions:  PRN Meds:.acetaminophen (TYLENOL) oral liquid 160 mg/5 mL, acetaminophen, albuterol, HYDROcodone-acetaminophen, LORazepam, morphine injection, ondansetron (ZOFRAN) IV, ondansetron   DVT Prophylaxis  SCDs    [facility]  [name] on 09/08/2012 at 12:58 PM  Between 7am to 7pm - Pager - [phone]  After 7pm go to [email or web address] - password [email or web address]  And look for the night coverage person covering for me after hours  [facility] Office  [phone]    Subjective:   [name] today has, No headache, No chest pain, No abdominal pain - No Nausea, No new weakness tingling or numbness, No Cough - SOB.    Objective:   Filed Vitals:   09/07/12 1418 09/07/12 2158 09/08/12 0143 09/08/12 0515  BP: 155/61 145/60 135/61 144/69  Pulse: 67 66 67 70  Temp: 98.9 F (37.2 C) 98.6 F (37 C) 98.2 F (36.8 C) 97.9 F (36.6 C)  TempSrc: Oral Oral Oral Oral  Resp: 19 18 18 18   Height:      Weight:      SpO2: 98% 98% 98% 98%    Wt Readings from Last 3 Encounters:  09/07/12 51.1 kg (112 lb 10.5 oz)  09/07/12 51.1 kg (112 lb 10.5 oz)  08/14/12 51 kg (112 lb 7 oz)     Intake/Output Summary (Last 24 hours) at 09/08/12 1258 Last data filed at 09/08/12 0533  Gross per 24 hour  Intake    574 ml  Output     45 ml    Net    529 ml    Exam Awake Alert,  , No new F.N deficits, Normal affect [place].AT,PERRAL, trach site appears clear Supple Neck,No JVD, No cervical lymphadenopathy appriciated.  Symmetrical Chest wall movement, Good air movement bilaterally, CTAB RRR,No Gallops,Rubs or new Murmurs, No Parasternal Heave +ve B.Sounds, Abd Soft, Non tender, No organomegaly appriciated, No rebound - guarding or rigidity. No Cyanosis, Clubbing or edema, No new Rash or bruise      Data Review   Micro Results No results found for this or any previous visit (from the past 240 hour(s)).  Radiology Reports Dg Chest 2 View  08/26/2012  *RADIOLOGY REPORT*  Clinical Data: Preop for laryngectomy  CHEST - 2 VIEW  Comparison: 08/10/2012  Findings: Cardiomediastinal silhouette is stable.  Tracheostomy tube is unchanged in position.  No acute infiltrate or pleural effusion.  No pulmonary edema. Bony thorax is unremarkable.  IMPRESSION:  No active disease.  Stable tracheostomy tube position.   Original Report Authenticated By: Lahoma Crocker, Shaw.    Ct Soft Tissue Neck W Contrast  08/08/2012  *RADIOLOGY REPORT*  Clinical Data: Laryngeal mass partially imaged on a chest CTA earlier today.  CT NECK WITH CONTRAST  Technique:  Multidetector CT imaging of the neck was performed with intravenous contrast.  Contrast: 30mL OMNIPAQUE IOHEXOL 300 MG/ML  SOLN  Comparison: Chest CTA obtained earlier today.  Findings: Again demonstrated is a large mass filling and expanding the larynx and hypopharynx above the level of the true cords.  This is involving the epiglottis and aryepiglottic folds and extending into the vallecula and piriform sinuses, greater on the right. This has a fluid density component on the left and is causing marked airway narrowing.  This mass measures 3.8 x 2.6 cm in maximum dimensions on image number 36 of series 8.  On coronal image number 49 of series 9, this measures 3.2 cm in length.  Also demonstrated is a bilobed or two  adjacent lymph nodes with central low density in the lateral neck on the right, anterior to the sternocleidomastoid muscle.  This measures 1.8 x 1.7 cm in maximum dimensions on image number 55 of series 2  and 2.7 cm in length on coronal image number 46 of series 3.  No additional enlarged lymph nodes are identified.  Mild bullous changes are demonstrated at the lung apices with biapical pleural and parenchymal scarring.  The thyroid gland is unremarkable.  Cervical spine degenerative changes.  IMPRESSION:  1.  3.8 x 3.2 x 2.6 cm laryngeal mass above the level of the true cords, involving the aryepiglottic folds, epiglottis, piriform sinuses and vallecula.  This is necrotic on the left and is causing marked airway narrowing.  This is most compatible with a primary squamous cell carcinoma. 2.  Necrotic metastatic adenopathy on the right, compatible with metastatic squamous cell carcinoma. 3.  COPD.   Original Report Authenticated By: Gerald Stabs, Shaw.    Ct Angio Chest Pe W/cm &/or Wo Cm  08/08/2012  *RADIOLOGY REPORT*  Clinical Data: Shortness of breath.  Wheezing.  CT ANGIOGRAPHY CHEST  Technique:  Multidetector CT imaging of the chest using the standard protocol during bolus administration of intravenous contrast. Multiplanar reconstructed images including MIPs were obtained and reviewed to evaluate the vascular anatomy.  Contrast:  74mL OMNIPAQUE IOHEXOL 350 MG/ML SOLN  Comparison: Chest x-ray dated 08/01/2012  Findings: There is a displaced fracture of the lateral aspect of the left sixth rib and a nondisplaced fracture of the lateral aspect of the left fifth rib. There is also a fracture of the anterior lateral aspect of the left seventh rib.  There is a 4.5 mm diameter cylindrical density in the lateral aspect of the right middle lobe, unchanged since prior CT scan dated 05/02/2003.  This could be a chronically impacted bronchus. This is not felt to be significant.  There is abnormal soft tissue  prominence in the larynx just above the true cords asymmetric to the right with marked transverse narrowing of the airway.  This could represent a mass.  The area is incompletely visualized on the chest CT.  No visible adenopathy.  Heart size is normal. No infiltrates or effusions.  No pulmonary emboli.  Moderate hiatal hernia.  IMPRESSION:  1.  There appears to be a soft tissue mass in the larynx just above the true cords compressing the airway. 2.  Fractures of the lateral aspects of the left fifth, sixth, and seventh ribs. There may be a fracture of the anterior lateral aspect of the left eighth rib as well. 3.  No pulmonary emboli.   Original Report Authenticated By: Larey Seat, Shaw.    Ir Gastrostomy Tube Mod Sed  08/17/2012  *RADIOLOGY REPORT*  PULL THROUGH GASTROSTOMY TUBE PLACEMENT UNDER FLUOROSCOPIC GUIDANCE  Date: 08/17/2012  Clinical History: 66 year old female with laryngeal cancer and dysphagia.  Percutaneous gastrostomy tube required for parenteral feeding during upcoming chemo and radiation therapy.  Procedures Performed:  1.  Fluoroscopically guided placement of percutaneous pull-through gastrostomy tube.  Interventional Radiologist:  Criselda Peaches, MD  Sedation: Moderate (conscious) sedation was used.  For mg Versed, 200 mcg Fentanyl were administered intravenously.  The patient's vital signs were monitored continuously by radiology nursing throughout the procedure.  Sedation Time: 25 minutes  Fluoroscopy time: 3.4 minutes  Contrast volume: 15 ml Omnipaque 300 administered into the stomach  Antibiotics:  1g Ancef was administered intravenously within 1 hour of skin incision.  PROCEDURE/FINDINGS:   Informed consent was obtained from the patient following explanation of the procedure, risks, benefits and alternatives. The patient understands, agrees and consents for the procedure. All questions were addressed. A time out was performed.  Maximal  barrier sterile technique utilized  including caps, mask, sterile gowns, sterile gloves, large sterile drape, hand hygiene, and chlorhexadine skin prep.  An angled catheter was advanced over a wire under fluoroscopic guidance through the nose, down the esophagus and into the body of the stomach.  The stomach was then insufflated with several 100 ml of air.  Fluoroscopy confirmed location of the gastric bubble, as well as inferior displacement of the barium stained colon.  Under direct fluoroscopic guidance, a single T-tack was placed, and the anterior gastric wall drawn up against the anterior abdominal wall. Percutaneous access was then obtained into the mid gastric body with an 18 gauge sheath needle.  Aspiration of air, and injection of contrast material under fluoroscopy confirmed needle placement.  An Amplatz wire was advanced in the gastric body and the access needle exchanged for a 9-French vascular sheath.  A snare device was advanced through the vascular sheath and an Amplatz wire advanced through the angled catheter.  The Amplatz wire was successfully snared and this was pulled up through the esophagus and out the mouth.  A 20-French Alinda Dooms MIC-PEG tube was then connected to the snare and pulled through the mouth, down the esophagus, into the stomach and out to the anterior abdominal wall. Hand injection of contrast material confirmed intragastric location. The T-tack retention suture was then cut. The pull through peg tube was then secured with the external bumper and capped.  The patient will be observed for several hours with the newly placed tube on low wall suction to evaluate for any post procedure complication.  The patient tolerated the procedure well, there is no immediate complication.  IMPRESSION:  Successful placement of a 20 French pull through gastrostomy tube.  The tube will be ready for use 5,000,000 tomorrow (08/18/2012).  Signed,  Criselda Peaches, MD Vascular & Interventional Radiologist [facility]    Original Report Authenticated By: Myrle Sheng    Mr Neck Soft Tissue Only Wo Contrast  08/17/2012  *RADIOLOGY REPORT*  Clinical Data:  66 year old female with bulky supraglottic larynx mass.  Question paraglottic involvement.  Airway compromise, but now status post tracheostomy.  Despite multiple attempts, both supine and decubitus, the patient was unable to tolerate MRI imaging.  The patient refused a planned repeat attempt with conscious sedation.  MRI NECK WITHOUT CONTRAST  Technique:  Multiplanar, multiecho pulse sequences of the neck and surrounding structures were obtained without intravenous contrast.  Comparison:  CT neck with contrast 08/08/2012.  Findings:  Axial T2 and only several axial T1-weighted noncontrast images of the neck are provided.  The bulky right greater than left supraglottic mass is heterogeneously T2 hyperintense and hypointense on T1-weighted imaging.  Similar signal in the bulky right level II lymphadenopathy is noted.  On axial T1 image 6 of series 4 there is loss of the right paraglottic fat signal.  This is in the area at or just superior to the right thyroid cartilage.  IMPRESSION: 1.  The examination had to be discontinued prior to completion due to patient request. 2.  MRI evidence of right paraglottic soft tissue involvement by the supraglottic mass, favor T4 tumor stage.   Original Report Authenticated By: Randall An, Shaw.    Nm Pet Image Initial (pi) Skull Base To Thigh  08/30/2012  *RADIOLOGY REPORT*  Clinical Data: Initial treatment strategy for head and neck. 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 CT data was obtained and used for attenuation correction and anatomic localization only.  (This was not acquired as a diagnostic CT examination.) Additional exam technical data entered on technologist worksheet.  Comparison:  Neck CT  08/08/2012  Findings:  Neck: There is an  intensely hypermetabolic mass centered within the posterior right aspect of the glottis with SUV max = [id number]  Hypermetabolic enlarged right level II lymph node measuring 18 mm (image 27) with SUV max = 7.5 consistent with local nodal metastasis.  No additional evidence of metastatic disease in the neck.  There is intense activity associated with tracheostomy site related to inflammation from recent surgery.  Chest:  Within the left chest wall,  there is focal intense hypermetabolic active associated with two adjacent fractures within the left lateral fifth and sixth ribs (images 65 and 74 respectively).  There is mild callus formation at the fracture sites.  No hypermetabolic mediastinal lymph nodes.  There is small focus of atelectasis at the right lung base (image 89) with associated metabolic activity which is likely inflammatory or infectious.  No suspicious pulmonary nodules.  Abdomen/Pelvis:  No abnormal hypermetabolic activity within the liver, pancreas, adrenal glands, or spleen.  No hypermetabolic lymph nodes in the abdomen or pelvis.  Skeleton:  No focal hypermetabolic activity to suggest skeletal metastasis. Left rib fractures as above.  IMPRESSION:  1.  Intensely hypermetabolic primary carcinoma within the posterior right glottis. 2.  Local nodal metastasis to the right level II station. 3.  Intense uptake in adjacent left lateral ribs fractures are in a traumatic pattern.  Cannot completely exclude pathologic fractures although not favored.  Recommend correlation with trauma. 4.  No evidence of systemic disease otherwise.  5.  Mild metabolic activity associated with right lower lobe atelectasis is likely infectious or inflammatory.   Original Report Authenticated By: Suzy Bouchard, Shaw.    Dg Chest Port 1 View  09/01/2012  *RADIOLOGY REPORT*  Clinical Data: Follow-up tracheostomy placement  PORTABLE CHEST - 1 VIEW  Comparison: 08/31/2012  Findings: Surgical  clips project over the neck. Tracheostomy tube not visualized. Mild bibasilar opacity.  Heart size upper normal. Trace left pleural effusion not excluded.  No pneumothorax.  No acute osseous finding.  IMPRESSION: Mild bibasilar opacities; atelectasis versus infiltrate.   Original Report Authenticated By: Suanne Marker, Shaw.    Dg Chest Port 1 View  08/31/2012  *RADIOLOGY REPORT*  Clinical Data: Post laryngectomy.  PORTABLE CHEST - 1 VIEW  Comparison: 08/26/2012.  Findings: Biapical pleural thickening without associate bony destruction noted on the preoperative exam.  Heart size top normal.  Mild central pulmonary vascular prominence without pulmonary edema.  No pneumothorax or segmental infiltrate.  IMPRESSION: No acute abnormality.   Original Report Authenticated By: Doug Sou, Shaw.    Dg Chest Port 1 View  08/10/2012  *RADIOLOGY REPORT*  Clinical Data: Status post tracheostomy.  PORTABLE CHEST - 1 VIEW  Comparison: CT of the chest 08/08/2012.  Findings: A tracheostomy has been placed.  The tip is at the level the clavicles, well above the carina.  The heart size is normal. New left basilar airspace disease likely reflects atelectasis.  The lung volumes are slightly decreased from prior study.  IMPRESSION:  1.  Interval placement of a tracheostomy tube without radiographic evidence for complication. 2.  New left basilar airspace disease.  This likely reflects atelectasis.  Early infection or aspiration is not excluded.   Original Report Authenticated By:  CHRISTOPHER W. MATTERN, Shaw.    Dg Swallowing Func-no Report  08/16/2012  CLINICAL DATA: Eval swallowing per ST recs.   FLUOROSCOPY FOR SWALLOWING FUNCTION STUDY:  Fluoroscopy was provided for swallowing function study, which was  administered by a speech pathologist.  Final results and recommendations  from this study are contained within the speech pathology report.      CBC  Lab 09/08/12 0610 09/07/12 0532 09/05/12 0705 09/04/12 0515  09/02/12 0430  WBC 9.6 8.7 10.9* 11.4* 10.2  HGB 10.4* 10.2* 10.8* 11.0* 10.3*  HCT 32.1* 31.4* 33.5* 33.6* 32.1*  PLT 318 307 295 308 289  MCV 85.8 86.3 85.7 85.5 86.1  MCH 27.8 28.0 27.6 28.0 27.6  MCHC 32.4 32.5 32.2 32.7 32.1  RDW 13.4 13.5 13.7 13.8 13.8  LYMPHSABS -- -- -- -- 1.4  MONOABS -- -- -- -- 1.0  EOSABS -- -- -- -- 0.1  BASOSABS -- -- -- -- 0.0  BANDABS -- -- -- -- --    Chemistries   Lab 09/08/12 0610 09/07/12 0532 09/06/12 0545 09/05/12 0705 09/04/12 0515 09/03/12 0620  NA 139 139 138 139 138 --  Ann 4.8 4.7 5.1 5.1 4.4 --  CL 100 102 100 101 100 --  CO2 25 28 27 26 26  --  GLUCOSE 111* 112* 105* 131* 117* --  BUN 27* 26* 23 22 20  --  CREATININE 0.93 1.06 1.14* 1.05 1.05 --  CALCIUM 9.6 9.3 10.0 9.9 9.9 --  MG -- -- -- 1.9 -- 1.7  AST 23 -- -- -- -- --  ALT 22 -- -- -- -- --  ALKPHOS 88 -- -- -- -- --  BILITOT 0.2* -- -- -- -- --   ------------------------------------------------------------------------------------------------------------------ estimated creatinine clearance is 42.7 ml/min (by C-G formula based on Cr of 0.93). ------------------------------------------------------------------------------------------------------------------ No results found for this basename: HGBA1C:2 in the last 72 hours ------------------------------------------------------------------------------------------------------------------ No results found for this basename: CHOL:2,HDL:2,LDLCALC:2,TRIG:2,CHOLHDL:2,LDLDIRECT:2 in the last 72 hours ------------------------------------------------------------------------------------------------------------------ No results found for this basename: TSH,T4TOTAL,FREET3,T3FREE,THYROIDAB in the last 72 hours ------------------------------------------------------------------------------------------------------------------ No results found for this basename: VITAMINB12:2,FOLATE:2,FERRITIN:2,TIBC:2,IRON:2,RETICCTPCT:2 in the last 72  hours  Coagulation profile No results found for this basename: INR:5,PROTIME:5 in the last 168 hours  No results found for this basename: DDIMER:2 in the last 72 hours  Cardiac Enzymes No results found for this basename: CK:3,CKMB:3,TROPONINI:3,MYOGLOBIN:3 in the last 168 hours ------------------------------------------------------------------------------------------------------------------ No components found with this basename: POCBNP:3

## 2012-09-08 NOTE — Progress Notes (Signed)
   ENT Progress Note: POD #8  s/p Procedure(s): LARYNGECTOMY RADICAL NECK DISSECTION   Subjective: Doing well, decreased pain  Objective: Vital signs in last 24 hours: Temp:  [97.9 F (36.6 C)-98.9 F (37.2 C)] 97.9 F (36.6 C) (09/26 0515) Pulse Rate:  [66-70] 70  (09/26 0515) Resp:  [18-19] 18  (09/26 0515) BP: (135-155)/(60-69) 144/69 mmHg (09/26 0515) SpO2:  [98 %] 98 % (09/26 0515) Weight change:  Last BM Date: 09/05/12  Intake/Output from previous day: 09/25 0701 - 09/26 0700 In: 574 [P.O.:100] Out: 45 [Drains:45] Intake/Output this shift:    Labs:  [facility] 09/08/12 0610 09/07/12 0532  WBC 9.6 8.7  HGB 10.4* 10.2*  HCT 32.1* 31.4*  PLT 318 307    Basename 09/08/12 0610 09/07/12 0532  NA 139 139  K 4.8 4.7  CL 100 102  CO2 25 28  GLUCOSE 111* 112*  BUN 27* 26*  CALCIUM 9.6 9.3    Studies/Results: No results found.   PHYSICAL EXAM: Inc intact, JP stable No evidence leakage with initial po   Assessment/Plan: Cont. Po fluids, monitor for leak or swelling Pt ambulating Plan d/c to home 9/30    Roselene Gray 09/08/2012, 11:37 AM

## 2012-09-09 NOTE — Progress Notes (Signed)
   ENT Progress Note: POD #9  s/p Procedure(s): LARYNGECTOMY RADICAL NECK DISSECTION   Subjective: Stable, c/o Otalgia   Objective: Vital signs in last 24 hours: Temp:  [97.7 F (36.5 C)-98.6 F (37 C)] 98.6 F (37 C) (09/27 0952) Pulse Rate:  [64-68] 66  (09/27 0952) Resp:  [15-20] 17  (09/27 0952) BP: (126-148)/(45-65) 148/65 mmHg (09/27 0952) SpO2:  [97 %-100 %] 98 % (09/27 0952) FiO2 (%):  [28 %] 28 % (09/27 0952) Weight change:  Last BM Date: 09/07/12  Intake/Output from previous day: 09/26 0701 - 09/27 0700 In: 1547 [P.O.:125] Out: 10 [Drains:10] Intake/Output this shift:    Labs:  [facility] 09/08/12 0610 09/07/12 0532  WBC 9.6 8.7  HGB 10.4* 10.2*  HCT 32.1* 31.4*  PLT 318 307    Basename 09/08/12 0610 09/07/12 0532  NA 139 139  K 4.8 4.7  CL 100 102  CO2 25 28  GLUCOSE 111* 112*  BUN 27* 26*  CALCIUM 9.6 9.3    Studies/Results: No results found.   PHYSICAL EXAM: Inc intact, No swelling or erythema JP at 10cc with po intake Stoma stable   Assessment/Plan: Pt doing well Cont po intake and Gtube feedings Drain and staple removal this weekend Plan d/c to home 9/30    [name] 09/09/2012, 10:30 AM

## 2012-09-09 NOTE — Progress Notes (Signed)
Physical Therapy Discharge Patient Details Name: [name] MRN: [id number] DOB: 1946/06/12 Today's Date: 09/09/2012 Time:  -    Order received and chart reviewed. On arrival, pt sleeping and daughter-in-law present. Reports pt had very little sleep last night and preferred for pt to rest. Discussed role of PT and daughter-in-law reports that pt has been walking to/from bathroom and 2x/day into the hall with family. She reports the pt initially preferred to hold someone's arm, however now walks without holding onto anyone. She does not have history of balance problems and daughter-in-law does not feel she needs any DME or PT at this time. RN confirmed that pt has been getting up with family.  PT evaluation deferred.  Odaliz Mcqueary 09/09/2012, 11:00 AM  Pager [phone]

## 2012-09-09 NOTE — Progress Notes (Signed)
Occupational Therapy Discharge Patient Details Name: [name] MRN: [id number] DOB: [birth year] Today's Date: 09/09/2012 Time:  -     Patient discharged from OT services secondary to goals met and no further OT needs identified---dtr reports that pt has been getting up with her to the bathroom, helping with B/D, moving her neck a lot. She is has no further concerns, will sign off.  Please see latest therapy progress note for current level of functioning and progress toward goals.    Progress and discharge plan discussed with patient and/or caregiver: Patient/Caregiver agrees with plan       Almon Register [id number] 09/09/2012, 3:30 PM

## 2012-09-09 NOTE — Progress Notes (Signed)
Clinical Social Work  CSW received inappropriate referral for SNF placement. Per chart reivew, MD and CM have spoken with family who desire HH. CSW is signing off but available if needed.  [place], [place] [phone]

## 2012-09-09 NOTE — Progress Notes (Signed)
JP BULB CHANGED, CONTINUES TO NOT HOLD CHARGE.

## 2012-09-09 NOTE — Progress Notes (Signed)
[facility]                                                                                Patient Demographics  [name], is a 66 y.o. female  [id number]  [id number]  DOB - 1945-12-26  Admit date - 08/31/2012  Admitting Physician Jerrell Belfast, MD  Outpatient Primary MD for the patient is [name], MD  LOS - 9   No chief complaint on file.       Assessment & Plan   Have discussed the plan with Dr. [name] attending physician, internal medicine will sign off please call me directly if you have any questions.    Acute on chronic pain syndrome  -Patient is comfortable on present pain regimen along with Remeron and Elavil at bedtime.    Hypertension  -Increase blood pressure likely due to pain  -Continue atenolol, amlodipine, better controlled today, no further change    Constipation  -improved with lactulose-->d/ced  -continue Colace and Senokot     Malnutrition  -Prealbumin 20.8--nutritional consult noted continue present in the feed regimen    Hyperlipidemia  -Continue Zocor     Tobacco use, presumptive COPD  -Intermittent bronchodilators  -Continue Nicoderm patch     Supraglottic (SCC) carcinoma (T4N1)  It is post laryngectomy, plan per primary team which is ENT, being seen by speech. -PET scan on September 17 showed hypermetabolic primary carcinoma in the posterior right glottis with local nodal metastasis; no other evidence of systemic disease  -Dr. Bernell List Khan--oncology--to f/u as outpt after wound heals  -Dr. Valere Dross --radiation oncology--XRT When her wound healed       Time Spent in minutes  35   Antibiotics     Anti-infectives     Start     Dose/Rate Route Frequency Ordered Stop   08/31/12 2000   clindamycin (CLEOCIN) IVPB 600 mg        600 mg 100 mL/hr over 30 Minutes Intravenous 3 times per day 08/31/12 1903 09/01/12 1507   08/31/12 1045   clindamycin (CLEOCIN) IVPB 600 mg  Status:   Discontinued        600 mg 100 mL/hr over 30 Minutes Intravenous  Once 08/31/12 1032 08/31/12 1841          Scheduled Meds:    . amitriptyline  10 mg Oral QHS  . amLODipine  2.5 mg Per Tube Daily  . antiseptic oral rinse  15 mL Mouth Rinse q12n4p  . atenolol  12.5 mg Per Tube BID  . bacitracin  1 application Topical [id number]  . chlorhexidine  15 mL Mouth Rinse BID  . docusate  100 mg Per Tube BID  . feeding supplement (OSMOLITE 1.2 CAL)  474 mL Per Tube Q8H  . fentaNYL  50 mcg Transdermal Q72H  . metoCLOPramide  5 mg Oral Q8H  . mirtazapine  15 mg Oral QHS  . neomycin-bacitracin-polymyxin  1 application Topical BID  . nicotine  21 mg Transdermal Daily  . ondansetron  4 mg Intravenous Q6H  . ranitidine  150 mg Per Tube Daily  . sennosides  5 mL Per Tube BID  . simvastatin  20 mg Oral q1800  Continuous Infusions:  PRN Meds:.acetaminophen (TYLENOL) oral liquid 160 mg/5 mL, acetaminophen, albuterol, HYDROcodone-acetaminophen, LORazepam, morphine injection, ondansetron (ZOFRAN) IV, ondansetron   DVT Prophylaxis  SCDs    Lala Lund K M.D on 09/09/2012 at 12:05 PM  Between 7am to 7pm - Pager - [phone]  After 7pm go to [email or web address] - password [email or web address]  And look for the night coverage person covering for me after hours  [facility] Office  [phone]    Subjective:   [name] today has, No headache, No chest pain, No abdominal pain - No Nausea, No new weakness tingling or numbness, No Cough - SOB.    Objective:   Filed Vitals:   09/09/12 0440 09/09/12 0552 09/09/12 0821 09/09/12 0952  BP:  126/45  148/65  Pulse: 64 66 68 66  Temp:  98.3 F (36.8 C)  98.6 F (37 C)  TempSrc:  Oral  Oral  Resp: 16 16 15 17   Height:      Weight:      SpO2: 97% 98% 98% 98%    Wt Readings from Last 3 Encounters:  09/07/12 51.1 kg (112 lb 10.5 oz)  09/07/12 51.1 kg (112 lb 10.5 oz)  08/14/12 51 kg (112 lb 7 oz)     Intake/Output Summary (Last 24  hours) at 09/09/12 1205 Last data filed at 09/09/12 0552  Gross per 24 hour  Intake    948 ml  Output     10 ml  Net    938 ml    Exam Awake Alert,  , No new F.N deficits, Normal affect [place].AT,PERRAL, trach site appears clear Supple Neck,No JVD, No cervical lymphadenopathy appriciated.  Symmetrical Chest wall movement, Good air movement bilaterally, CTAB RRR,No Gallops,Rubs or new Murmurs, No Parasternal Heave +ve B.Sounds, Abd Soft, Non tender, No organomegaly appriciated, No rebound - guarding or rigidity. No Cyanosis, Clubbing or edema, No new Rash or bruise      Data Review   Micro Results No results found for this or any previous visit (from the past 240 hour(s)).  Radiology Reports Dg Chest 2 View  08/26/2012  *RADIOLOGY REPORT*  Clinical Data: Preop for laryngectomy  CHEST - 2 VIEW  Comparison: 08/10/2012  Findings: Cardiomediastinal silhouette is stable.  Tracheostomy tube is unchanged in position.  No acute infiltrate or pleural effusion.  No pulmonary edema. Bony thorax is unremarkable.  IMPRESSION:  No active disease.  Stable tracheostomy tube position.   Original Report Authenticated By: Lahoma Crocker, M.D.    Ct Soft Tissue Neck W Contrast  08/08/2012  *RADIOLOGY REPORT*  Clinical Data: Laryngeal mass partially imaged on a chest CTA earlier today.  CT NECK WITH CONTRAST  Technique:  Multidetector CT imaging of the neck was performed with intravenous contrast.  Contrast: 55mL OMNIPAQUE IOHEXOL 300 MG/ML  SOLN  Comparison: Chest CTA obtained earlier today.  Findings: Again demonstrated is a large mass filling and expanding the larynx and hypopharynx above the level of the true cords.  This is involving the epiglottis and aryepiglottic folds and extending into the vallecula and piriform sinuses, greater on the right. This has a fluid density component on the left and is causing marked airway narrowing.  This mass measures 3.8 x 2.6 cm in maximum dimensions on image number 36 of  series 8.  On coronal image number 49 of series 9, this measures 3.2 cm in length.  Also demonstrated is a bilobed or two adjacent lymph nodes with central low density in the lateral neck on  the right, anterior to the sternocleidomastoid muscle.  This measures 1.8 x 1.7 cm in maximum dimensions on image number 55 of series 2 and 2.7 cm in length on coronal image number 46 of series 3.  No additional enlarged lymph nodes are identified.  Mild bullous changes are demonstrated at the lung apices with biapical pleural and parenchymal scarring.  The thyroid gland is unremarkable.  Cervical spine degenerative changes.  IMPRESSION:  1.  3.8 x 3.2 x 2.6 cm laryngeal mass above the level of the true cords, involving the aryepiglottic folds, epiglottis, piriform sinuses and vallecula.  This is necrotic on the left and is causing marked airway narrowing.  This is most compatible with a primary squamous cell carcinoma. 2.  Necrotic metastatic adenopathy on the right, compatible with metastatic squamous cell carcinoma. 3.  COPD.   Original Report Authenticated By: Gerald Stabs, M.D.    Ct Angio Chest Pe W/cm &/or Wo Cm  08/08/2012  *RADIOLOGY REPORT*  Clinical Data: Shortness of breath.  Wheezing.  CT ANGIOGRAPHY CHEST  Technique:  Multidetector CT imaging of the chest using the standard protocol during bolus administration of intravenous contrast. Multiplanar reconstructed images including MIPs were obtained and reviewed to evaluate the vascular anatomy.  Contrast:  98mL OMNIPAQUE IOHEXOL 350 MG/ML SOLN  Comparison: Chest x-ray dated 08/01/2012  Findings: There is a displaced fracture of the lateral aspect of the left sixth rib and a nondisplaced fracture of the lateral aspect of the left fifth rib. There is also a fracture of the anterior lateral aspect of the left seventh rib.  There is a 4.5 mm diameter cylindrical density in the lateral aspect of the right middle lobe, unchanged since prior CT scan dated 05/02/2003.   This could be a chronically impacted bronchus. This is not felt to be significant.  There is abnormal soft tissue prominence in the larynx just above the true cords asymmetric to the right with marked transverse narrowing of the airway.  This could represent a mass.  The area is incompletely visualized on the chest CT.  No visible adenopathy.  Heart size is normal. No infiltrates or effusions.  No pulmonary emboli.  Moderate hiatal hernia.  IMPRESSION:  1.  There appears to be a soft tissue mass in the larynx just above the true cords compressing the airway. 2.  Fractures of the lateral aspects of the left fifth, sixth, and seventh ribs. There may be a fracture of the anterior lateral aspect of the left eighth rib as well. 3.  No pulmonary emboli.   Original Report Authenticated By: Larey Seat, M.D.    Ir Gastrostomy Tube Mod Sed  08/17/2012  *RADIOLOGY REPORT*  PULL THROUGH GASTROSTOMY TUBE PLACEMENT UNDER FLUOROSCOPIC GUIDANCE  Date: 08/17/2012  Clinical History: 66 year old female with laryngeal cancer and dysphagia.  Percutaneous gastrostomy tube required for parenteral feeding during upcoming chemo and radiation therapy.  Procedures Performed:  1.  Fluoroscopically guided placement of percutaneous pull-through gastrostomy tube.  Interventional Radiologist:  Criselda Peaches, MD  Sedation: Moderate (conscious) sedation was used.  For mg Versed, 200 mcg Fentanyl were administered intravenously.  The patient's vital signs were monitored continuously by radiology nursing throughout the procedure.  Sedation Time: 25 minutes  Fluoroscopy time: 3.4 minutes  Contrast volume: 15 ml Omnipaque 300 administered into the stomach  Antibiotics:  1g Ancef was administered intravenously within 1 hour of skin incision.  PROCEDURE/FINDINGS:   Informed consent was obtained from the patient following explanation of the procedure,  risks, benefits and alternatives. The patient understands, agrees and consents for the  procedure. All questions were addressed. A time out was performed.  Maximal barrier sterile technique utilized including caps, mask, sterile gowns, sterile gloves, large sterile drape, hand hygiene, and chlorhexadine skin prep.  An angled catheter was advanced over a wire under fluoroscopic guidance through the nose, down the esophagus and into the body of the stomach.  The stomach was then insufflated with several 100 ml of air.  Fluoroscopy confirmed location of the gastric bubble, as well as inferior displacement of the barium stained colon.  Under direct fluoroscopic guidance, a single T-tack was placed, and the anterior gastric wall drawn up against the anterior abdominal wall. Percutaneous access was then obtained into the mid gastric body with an 18 gauge sheath needle.  Aspiration of air, and injection of contrast material under fluoroscopy confirmed needle placement.  An Amplatz wire was advanced in the gastric body and the access needle exchanged for a 9-French vascular sheath.  A snare device was advanced through the vascular sheath and an Amplatz wire advanced through the angled catheter.  The Amplatz wire was successfully snared and this was pulled up through the esophagus and out the mouth.  A 20-French Alinda Dooms MIC-PEG tube was then connected to the snare and pulled through the mouth, down the esophagus, into the stomach and out to the anterior abdominal wall. Hand injection of contrast material confirmed intragastric location. The T-tack retention suture was then cut. The pull through peg tube was then secured with the external bumper and capped.  The patient will be observed for several hours with the newly placed tube on low wall suction to evaluate for any post procedure complication.  The patient tolerated the procedure well, there is no immediate complication.  IMPRESSION:  Successful placement of a 20 French pull through gastrostomy tube.  The tube will be ready for use 5,000,000 tomorrow  (08/18/2012).  Signed,  Criselda Peaches, MD Vascular & Interventional Radiologist [facility] Radiology   Original Report Authenticated By: Myrle Sheng    Mr Neck Soft Tissue Only Wo Contrast  08/17/2012  *RADIOLOGY REPORT*  Clinical Data:  66 year old female with bulky supraglottic larynx mass.  Question paraglottic involvement.  Airway compromise, but now status post tracheostomy.  Despite multiple attempts, both supine and decubitus, the patient was unable to tolerate MRI imaging.  The patient refused a planned repeat attempt with conscious sedation.  MRI NECK WITHOUT CONTRAST  Technique:  Multiplanar, multiecho pulse sequences of the neck and surrounding structures were obtained without intravenous contrast.  Comparison:  CT neck with contrast 08/08/2012.  Findings:  Axial T2 and only several axial T1-weighted noncontrast images of the neck are provided.  The bulky right greater than left supraglottic mass is heterogeneously T2 hyperintense and hypointense on T1-weighted imaging.  Similar signal in the bulky right level II lymphadenopathy is noted.  On axial T1 image 6 of series 4 there is loss of the right paraglottic fat signal.  This is in the area at or just superior to the right thyroid cartilage.  IMPRESSION: 1.  The examination had to be discontinued prior to completion due to patient request. 2.  MRI evidence of right paraglottic soft tissue involvement by the supraglottic mass, favor T4 tumor stage.   Original Report Authenticated By: Randall An, M.D.    Nm Pet Image Initial (pi) Skull Base To Thigh  08/30/2012  *RADIOLOGY REPORT*  Clinical Data: Initial treatment strategy for head and neck.  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 CT data was obtained and used for attenuation correction and anatomic localization only.  (This was not acquired as a diagnostic CT  examination.) Additional exam technical data entered on technologist worksheet.  Comparison:  Neck CT 08/08/2012  Findings:  Neck: There is an  intensely hypermetabolic mass centered within the posterior right aspect of the glottis with SUV max = [id number]  Hypermetabolic enlarged right level II lymph node measuring 18 mm (image 27) with SUV max = 7.5 consistent with local nodal metastasis.  No additional evidence of metastatic disease in the neck.  There is intense activity associated with tracheostomy site related to inflammation from recent surgery.  Chest:  Within the left chest wall,  there is focal intense hypermetabolic active associated with two adjacent fractures within the left lateral fifth and sixth ribs (images 65 and 74 respectively).  There is mild callus formation at the fracture sites.  No hypermetabolic mediastinal lymph nodes.  There is small focus of atelectasis at the right lung base (image 89) with associated metabolic activity which is likely inflammatory or infectious.  No suspicious pulmonary nodules.  Abdomen/Pelvis:  No abnormal hypermetabolic activity within the liver, pancreas, adrenal glands, or spleen.  No hypermetabolic lymph nodes in the abdomen or pelvis.  Skeleton:  No focal hypermetabolic activity to suggest skeletal metastasis. Left rib fractures as above.  IMPRESSION:  1.  Intensely hypermetabolic primary carcinoma within the posterior right glottis. 2.  Local nodal metastasis to the right level II station. 3.  Intense uptake in adjacent left lateral ribs fractures are in a traumatic pattern.  Cannot completely exclude pathologic fractures although not favored.  Recommend correlation with trauma. 4.  No evidence of systemic disease otherwise.  5.  Mild metabolic activity associated with right lower lobe atelectasis is likely infectious or inflammatory.   Original Report Authenticated By: Suzy Bouchard, M.D.    Dg Chest Port 1 View  09/01/2012  *RADIOLOGY REPORT*  Clinical Data:  Follow-up tracheostomy placement  PORTABLE CHEST - 1 VIEW  Comparison: 08/31/2012  Findings: Surgical clips project over the neck. Tracheostomy tube not visualized. Mild bibasilar opacity.  Heart size upper normal. Trace left pleural effusion not excluded.  No pneumothorax.  No acute osseous finding.  IMPRESSION: Mild bibasilar opacities; atelectasis versus infiltrate.   Original Report Authenticated By: Suanne Marker, M.D.    Dg Chest Port 1 View  08/31/2012  *RADIOLOGY REPORT*  Clinical Data: Post laryngectomy.  PORTABLE CHEST - 1 VIEW  Comparison: 08/26/2012.  Findings: Biapical pleural thickening without associate bony destruction noted on the preoperative exam.  Heart size top normal.  Mild central pulmonary vascular prominence without pulmonary edema.  No pneumothorax or segmental infiltrate.  IMPRESSION: No acute abnormality.   Original Report Authenticated By: Doug Sou, M.D.    Dg Chest Port 1 View  08/10/2012  *RADIOLOGY REPORT*  Clinical Data: Status post tracheostomy.  PORTABLE CHEST - 1 VIEW  Comparison: CT of the chest 08/08/2012.  Findings: A tracheostomy has been placed.  The tip is at the level the clavicles, well above the carina.  The heart size is normal. New left basilar airspace disease likely reflects atelectasis.  The lung volumes are slightly decreased from prior study.  IMPRESSION:  1.  Interval placement of a tracheostomy tube without radiographic evidence for complication. 2.  New left basilar airspace disease.  This likely reflects atelectasis.  Early infection or aspiration is not excluded.   Original Report Authenticated By: Resa Miner. MATTERN, M.D.    Dg Swallowing Func-no Report  08/16/2012  CLINICAL DATA: Eval swallowing per ST recs.   FLUOROSCOPY FOR SWALLOWING FUNCTION STUDY:  Fluoroscopy was provided for swallowing function study, which was  administered by a speech pathologist.  Final results and recommendations  from this study are contained within the  speech pathology report.      CBC  Lab 09/08/12 0610 09/07/12 0532 09/05/12 0705 09/04/12 0515  WBC 9.6 8.7 10.9* 11.4*  HGB 10.4* 10.2* 10.8* 11.0*  HCT 32.1* 31.4* 33.5* 33.6*  PLT 318 307 295 308  MCV 85.8 86.3 85.7 85.5  MCH 27.8 28.0 27.6 28.0  MCHC 32.4 32.5 32.2 32.7  RDW 13.4 13.5 13.7 13.8  LYMPHSABS -- -- -- --  MONOABS -- -- -- --  EOSABS -- -- -- --  BASOSABS -- -- -- --  BANDABS -- -- -- --    Chemistries   Lab 09/08/12 0610 09/07/12 0532 09/06/12 0545 09/05/12 0705 09/04/12 0515 09/03/12 0620  NA 139 139 138 139 138 --  K 4.8 4.7 5.1 5.1 4.4 --  CL 100 102 100 101 100 --  CO2 25 28 27 26 26  --  GLUCOSE 111* 112* 105* 131* 117* --  BUN 27* 26* 23 22 20  --  CREATININE 0.93 1.06 1.14* 1.05 1.05 --  CALCIUM 9.6 9.3 10.0 9.9 9.9 --  MG -- -- -- 1.9 -- 1.7  AST 23 -- -- -- -- --  ALT 22 -- -- -- -- --  ALKPHOS 88 -- -- -- -- --  BILITOT 0.2* -- -- -- -- --   ------------------------------------------------------------------------------------------------------------------ estimated creatinine clearance is 42.7 ml/min (by C-G formula based on Cr of 0.93). ------------------------------------------------------------------------------------------------------------------ No results found for this basename: HGBA1C:2 in the last 72 hours ------------------------------------------------------------------------------------------------------------------ No results found for this basename: CHOL:2,HDL:2,LDLCALC:2,TRIG:2,CHOLHDL:2,LDLDIRECT:2 in the last 72 hours ------------------------------------------------------------------------------------------------------------------ No results found for this basename: TSH,T4TOTAL,FREET3,T3FREE,THYROIDAB in the last 72 hours ------------------------------------------------------------------------------------------------------------------ No results found for this basename: VITAMINB12:2,FOLATE:2,FERRITIN:2,TIBC:2,IRON:2,RETICCTPCT:2  in the last 72 hours  Coagulation profile No results found for this basename: INR:5,PROTIME:5 in the last 168 hours  No results found for this basename: DDIMER:2 in the last 72 hours  Cardiac Enzymes No results found for this basename: CK:3,CKMB:3,TROPONINI:3,MYOGLOBIN:3 in the last 168 hours ------------------------------------------------------------------------------------------------------------------ No components found with this basename: POCBNP:3

## 2012-09-10 NOTE — Progress Notes (Signed)
   ENT Progress Note: POD #10  s/p Procedure(s): LARYNGECTOMY RADICAL NECK DISSECTION   Subjective: Stable, cont pain, no evidence of infection  Objective: Vital signs in last 24 hours: Temp:  [97.9 F (36.6 C)-99 F (37.2 C)] 98.7 F (37.1 C) (09/28 1021) Pulse Rate:  [61-79] 70  (09/28 1021) Resp:  [14-18] 16  (09/28 1021) BP: (126-149)/(46-64) 141/46 mmHg (09/28 1021) SpO2:  [93 %-99 %] 99 % (09/28 1021) FiO2 (%):  [28 %] 28 % (09/28 1021) Weight change:  Last BM Date: 09/09/12  Intake/Output from previous day: 09/27 0701 - 09/28 0700 In: 1547 [P.O.:125] Out: 20 [Drains:20] Intake/Output this shift:    Labs:  [facility] 09/08/12 0610  WBC 9.6  HGB 10.4*  HCT 32.1*  PLT 318    Basename 09/08/12 0610  NA 139  K 4.8  CL 100  CO2 25  GLUCOSE 111*  BUN 27*  CALCIUM 9.6    Studies/Results: No results found.   PHYSICAL EXAM: Inc intact, no swelling or erythema JP removed  Assessment/Plan: Plan staple removal 9/29 Plan d/c 9/30 if cont improvement    [name] 09/10/2012, 10:38 AM

## 2012-09-10 NOTE — Progress Notes (Signed)
Noted some drainage from the old JP site when pt took sips of clear liquids.

## 2012-09-11 LAB — BASIC METABOLIC PANEL
CO2: 28 mEq/L (ref 19–32)
Calcium: 9.7 mg/dL (ref 8.4–10.5)
Chloride: 97 mEq/L (ref 96–112)
Potassium: 4.9 mEq/L (ref 3.5–5.1)
Sodium: 135 mEq/L (ref 135–145)

## 2012-09-11 LAB — CBC
MCH: 28.3 pg (ref 26.0–34.0)
Platelets: 304 10*3/uL (ref 150–400)
RBC: 3.71 MIL/uL — ABNORMAL LOW (ref 3.87–5.11)
WBC: 10.2 10*3/uL (ref 4.0–10.5)

## 2012-09-11 NOTE — Progress Notes (Signed)
Pt has been a lot of pain and grimaces at the thought of someone touching her neck or stoma at this time. Pt speaks little english but has family with her that will speak to and for her.  I did not do any cleaning of the stoma area because the family said she is in too much pain to allow that unless absolutely necessary.  The stoma area is very red and has secretions around the area but nothing is blocking air flow to the stoma. She is sleeping well at this time and I did not wake her to clean the area. Overall, the area does not look bad, it looks as it would normally.

## 2012-09-11 NOTE — Progress Notes (Signed)
   ENT Progress Note: POD #11 s/p Procedure(s): LARYNGECTOMY RADICAL NECK DISSECTION   Subjective: C/O cont neck pain  Objective: Vital signs in last 24 hours: Temp:  [98.4 F (36.9 C)-99 F (37.2 C)] 98.7 F (37.1 C) (09/29 0634) Pulse Rate:  [49-82] 67  (09/29 1052) Resp:  [16-19] 16  (09/29 0751) BP: (63-141)/(46-62) 127/52 mmHg (09/29 1052) SpO2:  [97 %-99 %] 97 % (09/29 0751) FiO2 (%):  [28 %] 28 % (09/29 0751) Weight change:  Last BM Date: 09/09/12  Intake/Output from previous day: 09/28 0701 - 09/29 0700 In: [id number] [P.O.:50] Out: 425 [Urine:425] Intake/Output this shift: Total I/O In: 475 [Other:475] Out: -   Labs:  [facility] 09/11/12 0039  WBC 10.2  HGB 10.5*  HCT 32.1*  PLT 304    Basename 09/11/12 0039  NA 135  K 4.9  CL 97  CO2 28  GLUCOSE 106*  BUN 31*  CALCIUM 9.7    Studies/Results: No results found.   PHYSICAL EXAM: Inc intact and stable Lt neck staples removed ? Small area of leakage from pharyngeal closure through the neck drain No erythema or swelling   Assessment/Plan: Cont current tx Monitor for possible leakage - may delay d/c if pt wound leak    Necha Harries 09/11/2012, 11:46 AM

## 2012-09-12 MED ORDER — LORAZEPAM 1 MG PO TABS
1.0000 mg | ORAL_TABLET | Freq: Two times a day (BID) | ORAL | Status: DC
  Administered 2012-09-12 – 2012-09-14 (×5): 1 mg via ORAL
  Filled 2012-09-12 (×4): qty 1

## 2012-09-12 NOTE — Progress Notes (Signed)
   ENT Progress Note: POD #12 s/p Procedure(s): LARYNGECTOMY RADICAL NECK DISSECTION   Subjective: Cont sig c/o of pain  Objective: Vital signs in last 24 hours: Temp:  [98 F (36.7 C)-98.5 F (36.9 C)] 98.1 F (36.7 C) (09/30 [id number]) Pulse Rate:  [60-79] 69  (09/30 0633) Resp:  [16-18] 18  (09/30 0633) BP: (124-154)/(51-60) 124/55 mmHg (09/30 0633) SpO2:  [95 %-99 %] 98 % (09/30 0633) FiO2 (%):  [28 %] 28 % (09/30 0633) Weight change:  Last BM Date: 09/10/12  Intake/Output from previous day: 09/29 0701 - 09/30 0700 In: 1410 [P.O.:360] Out: -  Intake/Output this shift:    Labs:  [facility] 09/11/12 0039  WBC 10.2  HGB 10.5*  HCT 32.1*  PLT 304    Basename 09/11/12 0039  NA 135  K 4.9  CL 97  CO2 28  GLUCOSE 106*  BUN 31*  CALCIUM 9.7    Studies/Results: No results found.   PHYSICAL EXAM: Inc stable, no erythema or swelling Min d/c from drain site   Assessment/Plan:  Concerned re possible fistula - no temp, swelling or erythema  Improving nutrition with tube feeds  Monitor closely, expect healing, doubt pt will require exploration  Pain management  Pt with sig break through complaints, requiring prn MSO4  Will consult pharmacy/pian management for help with out pt regimen   Cancel planned d/c for today, expect d/c 10/2 (?) if issues addressed.  [place], Clydell Sposito 09/12/2012, 7:26 AM

## 2012-09-13 MED ORDER — BACITRACIN-NEOMYCIN-POLYMYXIN 400-5-5000 EX OINT
TOPICAL_OINTMENT | CUTANEOUS | Status: AC
  Administered 2012-09-13: 11:00:00
  Filled 2012-09-13: qty 1

## 2012-09-13 NOTE — Progress Notes (Signed)
In the pt's MAR report it is documented from the previous shift 9/30 that pt's fentanyl patch was removed. Upon assessment this AM pt does have fentanyl patch on right arm and is not scheduled to be removed until 10/3. [name] D 09/13/2012

## 2012-09-13 NOTE — Progress Notes (Signed)
This Spanish speaking Clinical Social Worker (CSW) received a referral to provide support to pt who recently lost her cat. CSW visited pt room and met with both pt and daughter. CSW introduced herself and informed pt that CSW was available to provide support and or talk about pt recent loss. Pt immediately became very tearful and closed her eyes. Pt daughter informed CSW that pt is only able to communicate minimally by moving her lips and is hard to understand. CSW asked pt if she would like to share how she was feeling however pt nodded her head no. Pt daughter informed CSW that pt does not appear to want to discuss the loss of her cat at the moment. CSW respected wishes and CSW provided pt daughter with CSW contact information. Daughter very Patent attorney. CSW will sign off however available to provide support if pt wishes.   Hunt Oris, MSW, [place]

## 2012-09-13 NOTE — Progress Notes (Signed)
   ENT Progress Note: POD #13 s/p Procedure(s): LARYNGECTOMY RADICAL NECK DISSECTION   Subjective: Ant chest skin pain  Objective: Vital signs in last 24 hours: Temp:  [98.3 F (36.8 C)-98.6 F (37 C)] 98.3 F (36.8 C) (10/01 0520) Pulse Rate:  [68-79] 72  (10/01 1053) Resp:  [16-18] 18  (10/01 1053) BP: (128-154)/(52-70) 128/52 mmHg (10/01 1010) SpO2:  [95 %-100 %] 98 % (10/01 1053) FiO2 (%):  [28 %] 28 % (10/01 1053) Weight change:  Last BM Date: 09/10/12  Intake/Output from previous day: 09/30 0701 - 10/01 0700 In: 475  Out: -  Intake/Output this shift: Total I/O In: 554 [Other:554] Out: -   Labs:  [facility] 09/11/12 0039  WBC 10.2  HGB 10.5*  HCT 32.1*  PLT 304    Basename 09/11/12 0039  NA 135  K 4.9  CL 97  CO2 28  GLUCOSE 106*  BUN 31*  CALCIUM 9.7    Studies/Results: No results found.   PHYSICAL EXAM: Stable, no erythema or swelling Min d/c   Assessment/Plan: No input from pharmacy re outpt regimen, will call Plan staple removal and consider d/c on 10/2    Shakti Fleer 09/13/2012, 12:12 PM

## 2012-09-14 MED ORDER — AMLODIPINE BESYLATE 2.5 MG PO TABS: 2.5000 mg | ORAL_TABLET | Freq: Every day | ORAL | Status: DC

## 2012-09-14 MED ORDER — NICOTINE 21 MG/24HR TD PT24: 1.0000 | MEDICATED_PATCH | Freq: Every day | TRANSDERMAL | Status: DC

## 2012-09-14 MED ORDER — LORAZEPAM 1 MG PO TABS: 1.0000 mg | ORAL_TABLET | Freq: Two times a day (BID) | ORAL | Status: DC

## 2012-09-14 MED ORDER — ATENOLOL 12.5 MG HALF TABLET: 25.0000 mg | ORAL_TABLET | Freq: Two times a day (BID) | ORAL | Status: DC

## 2012-09-14 MED ORDER — BACITRACIN-NEOMYCIN-POLYMYXIN 400-5-5000 EX OINT
TOPICAL_OINTMENT | CUTANEOUS | Status: AC
  Filled 2012-09-14: qty 1

## 2012-09-14 MED ORDER — METOCLOPRAMIDE HCL 5 MG/5ML PO SOLN: 5.0000 mg | Freq: Three times a day (TID) | ORAL | Status: DC

## 2012-09-14 MED ORDER — OSMOLITE 1.2 CAL PO LIQD: 474.0000 mL | Freq: Three times a day (TID) | ORAL | Status: DC

## 2012-09-14 MED ORDER — AMITRIPTYLINE HCL 10 MG PO TABS: 10.0000 mg | ORAL_TABLET | Freq: Every day | ORAL | Status: DC

## 2012-09-14 MED ORDER — FENTANYL 50 MCG/HR TD PT72: 1.0000 | MEDICATED_PATCH | TRANSDERMAL | Status: DC

## 2012-09-14 MED ORDER — BIOTENE DRY MOUTH MT LIQD: 15.0000 mL | Freq: Two times a day (BID) | OROMUCOSAL | Status: DC

## 2012-09-14 MED ORDER — HYDROCODONE-ACETAMINOPHEN 7.5-500 MG/15ML PO SOLN: 10.0000 mL | ORAL | Status: DC | PRN

## 2012-09-14 NOTE — Progress Notes (Signed)
   ENT Progress Note: POD #14 s/p Procedure(s): LARYNGECTOMY RADICAL NECK DISSECTION   Subjective: Some pain, stable  Objective: Vital signs in last 24 hours: Temp:  [96.7 F (35.9 C)-99 F (37.2 C)] 98.2 F (36.8 C) (10/02 0515) Pulse Rate:  [64-75] 64  (10/02 0812) Resp:  [16-20] 17  (10/02 0812) BP: (124-151)/(52-67) 136/67 mmHg (10/02 0515) SpO2:  [98 %-99 %] 99 % (10/02 0812) FiO2 (%):  [28 %] 28 % (10/02 [id number]) Weight change:  Last BM Date: 09/13/12  Intake/Output from previous day: 10/01 0701 - 10/02 0700 In: 1762  Out: -  Intake/Output this shift:    Labs: No results found for this basename: WBC:2,HGB:2,HCT:2,PLT:2 in the last 72 hours No results found for this basename: NA:2,K:2,CL:2,CO2:2,GLUCOSE:2,BUN:2,CREATININR:2,CALCIUM:2 in the last 72 hours  Studies/Results: No results found.   PHYSICAL EXAM: Staples removed, inc intact Stoma intact Min d/c from drain site   Assessment/Plan: Pt stable D/C to home F/U cancer center ~2 wks    [name] 09/14/2012, 9:03 AM

## 2012-09-14 NOTE — Discharge Summary (Signed)
Physician Discharge Summary  Patient ID: [name] MRN: [id number] DOB/AGE: 1946/06/25 66 y.o.  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 female who was admitted 08/31/2012 with a diagnosis of T4N2 SCCa of the supraglottis and went to the operating room on 08/31/2012 and underwent Total Laryngectomy and Bil Neck Dissections. Pt monitored in ICU for 4 days then transferred to 6N. Postop course stable, concerns regarding anemia, nutrition and healing. Possible small fistula and pharyngeal closure, managed conservatively with dressings. No infection or wound dehis.   D/C'ed to home in stable condition.  Recent vital signs:  Filed Vitals:   09/14/12 0812  BP:   Pulse: 64  Temp:   Resp: 17    Recent laboratory studies:  Results for orders placed during the hospital encounter of 08/31/12  TYPE AND SCREEN      Component Value Range   ABO/RH(D) O POS     Antibody Screen NEG     Sample Expiration 09/03/2012     Unit Number [id number]     Blood Component Type RED CELLS,LR     Unit division 00     Status of Unit ISSUED,FINAL     Transfusion Status OK TO TRANSFUSE     Crossmatch Result Compatible     Unit Number [id number]     Blood Component Type RED CELLS,LR     Unit division 00     Status of Unit ISSUED,FINAL     Transfusion Status OK TO TRANSFUSE     Crossmatch Result Compatible     Unit Number [id number]     Blood Component Type RED CELLS,LR     Unit division 00     Status of Unit REL FROM [facility]     Transfusion Status OK TO TRANSFUSE     Crossmatch Result Compatible     Unit Number [id number]     Blood Component  Type RED CELLS,LR     Unit division 00     Status of Unit REL FROM [facility]     Transfusion Status OK TO TRANSFUSE     Crossmatch Result Compatible    PREPARE RBC (CROSSMATCH)      Component Value Range   Order Confirmation ORDER PROCESSED BY BLOOD BANK    ABO/RH      Component Value Range   ABO/RH(D) O POS    PREPARE RBC (CROSSMATCH)      Component Value Range   Order Confirmation ORDER PROCESSED BY BLOOD BANK    CBC      Component Value Range   WBC 8.6  4.0 - 10.5 K/uL   RBC 3.72 (*) 3.87 - 5.11 MIL/uL   Hemoglobin 10.3 (*) 12.0 - 15.0 g/dL   HCT 30.6 (*) 36.0 - 46.0 %   MCV 82.3  78.0 - 100.0 fL   MCH 27.7  26.0 - 34.0 pg   MCHC 33.7  30.0 - 36.0 g/dL   RDW 13.4  11.5 - 15.5 %   Platelets 282  150 - 400 K/uL  BASIC METABOLIC PANEL      Component Value Range   Sodium 137  135 - 145 mEq/L   Potassium 4.4  3.5 - 5.1 mEq/L   Chloride  102  96 - 112 mEq/L   CO2 23  19 - 32 mEq/L   Glucose, Bld 143 (*) 70 - 99 mg/dL   BUN 36 (*) 6 - 23 mg/dL   Creatinine, Ser 1.45 (*) 0.50 - 1.10 mg/dL   Calcium 9.2  8.4 - 10.5 mg/dL   GFR calc non Af Amer 37 (*) >90 mL/min   GFR calc Af Amer 42 (*) >90 mL/min  CBC WITH DIFFERENTIAL      Component Value Range   WBC 10.2  4.0 - 10.5 K/uL   RBC 3.73 (*) 3.87 - 5.11 MIL/uL   Hemoglobin 10.3 (*) 12.0 - 15.0 g/dL   HCT 32.1 (*) 36.0 - 46.0 %   MCV 86.1  78.0 - 100.0 fL   MCH 27.6  26.0 - 34.0 pg   MCHC 32.1  30.0 - 36.0 g/dL   RDW 13.8  11.5 - 15.5 %   Platelets 289  150 - 400 K/uL   Neutrophils Relative 76  43 - 77 %   Neutro Abs 7.7  1.7 - 7.7 K/uL   Lymphocytes Relative 13  12 - 46 %   Lymphs Abs 1.4  0.7 - 4.0 K/uL   Monocytes Relative 9  3 - 12 %   Monocytes Absolute 1.0  0.1 - 1.0 K/uL   Eosinophils Relative 1  0 - 5 %   Eosinophils Absolute 0.1  0.0 - 0.7 K/uL   Basophils Relative 0  0 - 1 %   Basophils Absolute 0.0  0.0 - 0.1 K/uL  BASIC METABOLIC PANEL      Component Value Range   Sodium 139  135 - 145 mEq/L   Potassium 4.2   3.5 - 5.1 mEq/L   Chloride 104  96 - 112 mEq/L   CO2 24  19 - 32 mEq/L   Glucose, Bld 146 (*) 70 - 99 mg/dL   BUN 18  6 - 23 mg/dL   Creatinine, Ser 1.10  0.50 - 1.10 mg/dL   Calcium 9.3  8.4 - 10.5 mg/dL   GFR calc non Af Amer 51 (*) >90 mL/min   GFR calc Af Amer 59 (*) >90 mL/min  POCT I-STAT 4, (NA,K, GLUC, HGB,HCT)      Component Value Range   Sodium 136  135 - 145 mEq/L   Potassium 4.6  3.5 - 5.1 mEq/L   Glucose, Bld 122 (*) 70 - 99 mg/dL   HCT 23.0 (*) 36.0 - 46.0 %   Hemoglobin 7.8 (*) 12.0 - 15.0 g/dL  GLUCOSE, CAPILLARY      Component Value Range   Glucose-Capillary 155 (*) 70 - 99 mg/dL   Comment 1 Documented in Chart     Comment 2 Notify RN    GLUCOSE, CAPILLARY      Component Value Range   Glucose-Capillary 148 (*) 70 - 99 mg/dL   Comment 1 Documented in Chart     Comment 2 Notify RN    GLUCOSE, CAPILLARY      Component Value Range   Glucose-Capillary 148 (*) 70 - 99 mg/dL  GLUCOSE, CAPILLARY      Component Value Range   Glucose-Capillary 116 (*) 70 - 99 mg/dL  BASIC METABOLIC PANEL      Component Value Range   Sodium 137  135 - 145 mEq/L   Potassium 4.3  3.5 - 5.1 mEq/L   Chloride 101  96 - 112 mEq/L   CO2 26  19 - 32 mEq/L  Glucose, Bld 125 (*) 70 - 99 mg/dL   BUN 17  6 - 23 mg/dL   Creatinine, Ser 0.97  0.50 - 1.10 mg/dL   Calcium 9.4  8.4 - 10.5 mg/dL   GFR calc non Af Amer 60 (*) >90 mL/min   GFR calc Af Amer 69 (*) >90 mL/min  MAGNESIUM      Component Value Range   Magnesium 1.7  1.5 - 2.5 mg/dL  PHOSPHORUS      Component Value Range   Phosphorus 3.0  2.3 - 4.6 mg/dL  GLUCOSE, CAPILLARY      Component Value Range   Glucose-Capillary 104 (*) 70 - 99 mg/dL   Comment 1 Notify RN    GLUCOSE, CAPILLARY      Component Value Range   Glucose-Capillary 146 (*) 70 - 99 mg/dL  GLUCOSE, CAPILLARY      Component Value Range   Glucose-Capillary 147 (*) 70 - 99 mg/dL  BASIC METABOLIC PANEL      Component Value Range   Sodium 138  135 - 145 mEq/L    Potassium 4.4  3.5 - 5.1 mEq/L   Chloride 100  96 - 112 mEq/L   CO2 26  19 - 32 mEq/L   Glucose, Bld 117 (*) 70 - 99 mg/dL   BUN 20  6 - 23 mg/dL   Creatinine, Ser 1.05  0.50 - 1.10 mg/dL   Calcium 9.9  8.4 - 10.5 mg/dL   GFR calc non Af Amer 54 (*) >90 mL/min   GFR calc Af Amer 63 (*) >90 mL/min  CBC      Component Value Range   WBC 11.4 (*) 4.0 - 10.5 K/uL   RBC 3.93  3.87 - 5.11 MIL/uL   Hemoglobin 11.0 (*) 12.0 - 15.0 g/dL   HCT 33.6 (*) 36.0 - 46.0 %   MCV 85.5  78.0 - 100.0 fL   MCH 28.0  26.0 - 34.0 pg   MCHC 32.7  30.0 - 36.0 g/dL   RDW 13.8  11.5 - 15.5 %   Platelets 308  150 - 400 K/uL  PREALBUMIN      Component Value Range   Prealbumin 20.8  17.0 - 34.0 mg/dL  GLUCOSE, CAPILLARY      Component Value Range   Glucose-Capillary 113 (*) 70 - 99 mg/dL   Comment 1 Notify RN    GLUCOSE, CAPILLARY      Component Value Range   Glucose-Capillary 125 (*) 70 - 99 mg/dL   Comment 1 Notify RN    GLUCOSE, CAPILLARY      Component Value Range   Glucose-Capillary 95  70 - 99 mg/dL   Comment 1 Notify RN    BASIC METABOLIC PANEL      Component Value Range   Sodium 139  135 - 145 mEq/L   Potassium 5.1  3.5 - 5.1 mEq/L   Chloride 101  96 - 112 mEq/L   CO2 26  19 - 32 mEq/L   Glucose, Bld 131 (*) 70 - 99 mg/dL   BUN 22  6 - 23 mg/dL   Creatinine, Ser 1.05  0.50 - 1.10 mg/dL   Calcium 9.9  8.4 - 10.5 mg/dL   GFR calc non Af Amer 54 (*) >90 mL/min   GFR calc Af Amer 63 (*) >90 mL/min  CBC      Component Value Range   WBC 10.9 (*) 4.0 - 10.5 K/uL   RBC 3.91  3.87 - 5.11 MIL/uL  Hemoglobin 10.8 (*) 12.0 - 15.0 g/dL   HCT 33.5 (*) 36.0 - 46.0 %   MCV 85.7  78.0 - 100.0 fL   MCH 27.6  26.0 - 34.0 pg   MCHC 32.2  30.0 - 36.0 g/dL   RDW 13.7  11.5 - 15.5 %   Platelets 295  150 - 400 K/uL  MAGNESIUM      Component Value Range   Magnesium 1.9  1.5 - 2.5 mg/dL  BASIC METABOLIC PANEL      Component Value Range   Sodium 138  135 - 145 mEq/L   Potassium 5.1  3.5 - 5.1 mEq/L    Chloride 100  96 - 112 mEq/L   CO2 27  19 - 32 mEq/L   Glucose, Bld 105 (*) 70 - 99 mg/dL   BUN 23  6 - 23 mg/dL   Creatinine, Ser 1.14 (*) 0.50 - 1.10 mg/dL   Calcium 10.0  8.4 - 10.5 mg/dL   GFR calc non Af Amer 49 (*) >90 mL/min   GFR calc Af Amer 57 (*) >90 mL/min  BASIC METABOLIC PANEL      Component Value Range   Sodium 139  135 - 145 mEq/L   Potassium 4.7  3.5 - 5.1 mEq/L   Chloride 102  96 - 112 mEq/L   CO2 28  19 - 32 mEq/L   Glucose, Bld 112 (*) 70 - 99 mg/dL   BUN 26 (*) 6 - 23 mg/dL   Creatinine, Ser 1.06  0.50 - 1.10 mg/dL   Calcium 9.3  8.4 - 10.5 mg/dL   GFR calc non Af Amer 53 (*) >90 mL/min   GFR calc Af Amer 62 (*) >90 mL/min  CBC      Component Value Range   WBC 8.7  4.0 - 10.5 K/uL   RBC 3.64 (*) 3.87 - 5.11 MIL/uL   Hemoglobin 10.2 (*) 12.0 - 15.0 g/dL   HCT 31.4 (*) 36.0 - 46.0 %   MCV 86.3  78.0 - 100.0 fL   MCH 28.0  26.0 - 34.0 pg   MCHC 32.5  30.0 - 36.0 g/dL   RDW 13.5  11.5 - 15.5 %   Platelets 307  150 - 400 K/uL  CBC      Component Value Range   WBC 9.6  4.0 - 10.5 K/uL   RBC 3.74 (*) 3.87 - 5.11 MIL/uL   Hemoglobin 10.4 (*) 12.0 - 15.0 g/dL   HCT 32.1 (*) 36.0 - 46.0 %   MCV 85.8  78.0 - 100.0 fL   MCH 27.8  26.0 - 34.0 pg   MCHC 32.4  30.0 - 36.0 g/dL   RDW 13.4  11.5 - 15.5 %   Platelets 318  150 - 400 K/uL  COMPREHENSIVE METABOLIC PANEL      Component Value Range   Sodium 139  135 - 145 mEq/L   Potassium 4.8  3.5 - 5.1 mEq/L   Chloride 100  96 - 112 mEq/L   CO2 25  19 - 32 mEq/L   Glucose, Bld 111 (*) 70 - 99 mg/dL   BUN 27 (*) 6 - 23 mg/dL   Creatinine, Ser 0.93  0.50 - 1.10 mg/dL   Calcium 9.6  8.4 - 10.5 mg/dL   Total Protein 6.6  6.0 - 8.3 g/dL   Albumin 2.9 (*) 3.5 - 5.2 g/dL   AST 23  0 - 37 U/L   ALT 22  0 - 35 U/L  Alkaline Phosphatase 88  39 - 117 U/L   Total Bilirubin 0.2 (*) 0.3 - 1.2 mg/dL   GFR calc non Af Amer 63 (*) >90 mL/min   GFR calc Af Amer 73 (*) >90 mL/min  CBC      Component Value Range   WBC 10.2   4.0 - 10.5 K/uL   RBC 3.71 (*) 3.87 - 5.11 MIL/uL   Hemoglobin 10.5 (*) 12.0 - 15.0 g/dL   HCT 32.1 (*) 36.0 - 46.0 %   MCV 86.5  78.0 - 100.0 fL   MCH 28.3  26.0 - 34.0 pg   MCHC 32.7  30.0 - 36.0 g/dL   RDW 13.4  11.5 - 15.5 %   Platelets 304  150 - 400 K/uL  BASIC METABOLIC PANEL      Component Value Range   Sodium 135  135 - 145 mEq/L   Potassium 4.9  3.5 - 5.1 mEq/L   Chloride 97  96 - 112 mEq/L   CO2 28  19 - 32 mEq/L   Glucose, Bld 106 (*) 70 - 99 mg/dL   BUN 31 (*) 6 - 23 mg/dL   Creatinine, Ser 1.12 (*) 0.50 - 1.10 mg/dL   Calcium 9.7  8.4 - 10.5 mg/dL   GFR calc non Af Amer 50 (*) >90 mL/min   GFR calc Af Amer 58 (*) >90 mL/min    Discharge Medications:     Medication List     As of 09/14/2012  9:06 AM    ASK your doctor about these medications         amitriptyline 10 MG tablet   Commonly known as: ELAVIL   Take 10 mg by mouth at bedtime.      antiseptic oral rinse Liqd   15 mLs by Mouth Rinse route 2 times daily at 12 noon and 4 pm.      atenolol 50 MG tablet   Commonly known as: TENORMIN   Take 12.5 mg by mouth See admin instructions. Twice Daily: Give morning dose via tube; give evening dose by mouth if able      chlorhexidine 0.12 % solution   Commonly known as: PERIDEX   Use as directed 15 mLs in the mouth or throat 2 (two) times daily.      clopidogrel 75 MG tablet   Commonly known as: PLAVIX   Place 75 mg into feeding tube every morning.      feeding supplement Liqd   Take 237 mLs by mouth as needed. For additional nutrition      feeding supplement (OSMOLITE 1.2 CAL) Liqd   Place 1,000 mLs into feeding tube continuous.      fentaNYL 12 MCG/HR   Commonly known as: DURAGESIC - dosed mcg/hr   Place 1 patch onto the skin every 3 (three) days.      free water Soln   Place 30-60 mLs into feeding tube every 6 (six) hours.      HYDROcodone-acetaminophen 10-325 MG per tablet   Commonly known as: NORCO   Take 1 tablet by mouth every 6 (six)  hours as needed. For pain      HYDROmorphone 8 MG tablet   Commonly known as: DILAUDID   Take 8 mg by mouth every 4 (four) hours as needed. For pain      LORazepam 1 MG tablet   Commonly known as: ATIVAN   Take 1 mg by mouth every 6 (six) hours as needed. For anxiety  mirtazapine 15 MG disintegrating tablet   Commonly known as: REMERON SOL-TAB   Take 15 mg by mouth at bedtime.      neomycin-bacitracin-polymyxin Oint   Commonly known as: NEOSPORIN   Apply 1 application topically daily. To trach site      nicotine 21 mg/24hr patch   Commonly known as: NICODERM CQ - dosed in mg/24 hours   Place 1 patch onto the skin daily.      Olmesartan-Amlodipine-HCTZ 40-5-25 MG Tabs   Place 0.5 tablets into feeding tube daily.      ondansetron 4 MG/5ML solution   Commonly known as: ZOFRAN   Take 4 mg by mouth 2 (two) times daily as needed.      pravastatin 40 MG tablet   Commonly known as: PRAVACHOL   Place 40 mg into feeding tube daily.      ranitidine 150 MG/10ML syrup   Commonly known as: ZANTAC   Place 150 mg into feeding tube daily.        Diagnostic Studies: Dg Chest 2 View  08/26/2012  *RADIOLOGY REPORT*  Clinical Data: Preop for laryngectomy  CHEST - 2 VIEW  Comparison: 08/10/2012  Findings: Cardiomediastinal silhouette is stable.  Tracheostomy tube is unchanged in position.  No acute infiltrate or pleural effusion.  No pulmonary edema. Bony thorax is unremarkable.  IMPRESSION:  No active disease.  Stable tracheostomy tube position.   Original Report Authenticated By: Lahoma Crocker, M.D.    Ir Gastrostomy Tube Mod Sed  08/17/2012  *RADIOLOGY REPORT*  PULL THROUGH GASTROSTOMY TUBE PLACEMENT UNDER FLUOROSCOPIC GUIDANCE  Date: 08/17/2012  Clinical History: 66 year old female with laryngeal cancer and dysphagia.  Percutaneous gastrostomy tube required for parenteral feeding during upcoming chemo and radiation therapy.  Procedures Performed:  1.  Fluoroscopically guided placement of  percutaneous pull-through gastrostomy tube.  Interventional Radiologist:  Criselda Peaches, MD  Sedation: Moderate (conscious) sedation was used.  For mg Versed, 200 mcg Fentanyl were administered intravenously.  The patient's vital signs were monitored continuously by radiology nursing throughout the procedure.  Sedation Time: 25 minutes  Fluoroscopy time: 3.4 minutes  Contrast volume: 15 ml Omnipaque 300 administered into the stomach  Antibiotics:  1g Ancef was administered intravenously within 1 hour of skin incision.  PROCEDURE/FINDINGS:   Informed consent was obtained from the patient following explanation of the procedure, risks, benefits and alternatives. The patient understands, agrees and consents for the procedure. All questions were addressed. A time out was performed.  Maximal barrier sterile technique utilized including caps, mask, sterile gowns, sterile gloves, large sterile drape, hand hygiene, and chlorhexadine skin prep.  An angled catheter was advanced over a wire under fluoroscopic guidance through the nose, down the esophagus and into the body of the stomach.  The stomach was then insufflated with several 100 ml of air.  Fluoroscopy confirmed location of the gastric bubble, as well as inferior displacement of the barium stained colon.  Under direct fluoroscopic guidance, a single T-tack was placed, and the anterior gastric wall drawn up against the anterior abdominal wall. Percutaneous access was then obtained into the mid gastric body with an 18 gauge sheath needle.  Aspiration of air, and injection of contrast material under fluoroscopy confirmed needle placement.  An Amplatz wire was advanced in the gastric body and the access needle exchanged for a 9-French vascular sheath.  A snare device was advanced through the vascular sheath and an Amplatz wire advanced through the angled catheter.  The Amplatz wire was successfully snared and  this was pulled up through the esophagus and out the  mouth.  A 20-French Alinda Dooms MIC-PEG tube was then connected to the snare and pulled through the mouth, down the esophagus, into the stomach and out to the anterior abdominal wall. Hand injection of contrast material confirmed intragastric location. The T-tack retention suture was then cut. The pull through peg tube was then secured with the external bumper and capped.  The patient will be observed for several hours with the newly placed tube on low wall suction to evaluate for any post procedure complication.  The patient tolerated the procedure well, there is no immediate complication.  IMPRESSION:  Successful placement of a 20 French pull through gastrostomy tube.  The tube will be ready for use 5,000,000 tomorrow (08/18/2012).  Signed,  Criselda Peaches, MD Vascular & Interventional Radiologist [facility] Radiology   Original Report Authenticated By: Myrle Sheng    Mr Neck Soft Tissue Only Wo Contrast  08/17/2012  *RADIOLOGY REPORT*  Clinical Data:  66 year old female with bulky supraglottic larynx mass.  Question paraglottic involvement.  Airway compromise, but now status post tracheostomy.  Despite multiple attempts, both supine and decubitus, the patient was unable to tolerate MRI imaging.  The patient refused a planned repeat attempt with conscious sedation.  MRI NECK WITHOUT CONTRAST  Technique:  Multiplanar, multiecho pulse sequences of the neck and surrounding structures were obtained without intravenous contrast.  Comparison:  CT neck with contrast 08/08/2012.  Findings:  Axial T2 and only several axial T1-weighted noncontrast images of the neck are provided.  The bulky right greater than left supraglottic mass is heterogeneously T2 hyperintense and hypointense on T1-weighted imaging.  Similar signal in the bulky right level II lymphadenopathy is noted.  On axial T1 image 6 of series 4 there is loss of the right paraglottic fat signal.  This is in the area at or just superior to the right thyroid  cartilage.  IMPRESSION: 1.  The examination had to be discontinued prior to completion due to patient request. 2.  MRI evidence of right paraglottic soft tissue involvement by the supraglottic mass, favor T4 tumor stage.   Original Report Authenticated By: Randall An, M.D.    Nm Pet Image Initial (pi) Skull Base To Thigh  08/30/2012  *RADIOLOGY REPORT*  Clinical Data: Initial treatment strategy for head and neck. 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 CT data was obtained and used for attenuation correction and anatomic localization only.  (This was not acquired as a diagnostic CT examination.) Additional exam technical data entered on technologist worksheet.  Comparison:  Neck CT 08/08/2012  Findings:  Neck: There is an  intensely hypermetabolic mass centered within the posterior right aspect of the glottis with SUV max = [id number]  Hypermetabolic enlarged right level II lymph node measuring 18 mm (image 27) with SUV max = 7.5 consistent with local nodal metastasis.  No additional evidence of metastatic disease in the neck.  There is intense activity associated with tracheostomy site related to inflammation from recent surgery.  Chest:  Within the left chest wall,  there is focal intense hypermetabolic active associated with two adjacent fractures within the left lateral fifth and sixth ribs (images 65 and 74 respectively).  There is mild callus formation at the fracture sites.  No hypermetabolic mediastinal lymph nodes.  There is small focus of atelectasis at  the right lung base (image 89) with associated metabolic activity which is likely inflammatory or infectious.  No suspicious pulmonary nodules.  Abdomen/Pelvis:  No abnormal hypermetabolic activity within the liver, pancreas, adrenal glands, or spleen.  No hypermetabolic lymph nodes in the abdomen or pelvis.   Skeleton:  No focal hypermetabolic activity to suggest skeletal metastasis. Left rib fractures as above.  IMPRESSION:  1.  Intensely hypermetabolic primary carcinoma within the posterior right glottis. 2.  Local nodal metastasis to the right level II station. 3.  Intense uptake in adjacent left lateral ribs fractures are in a traumatic pattern.  Cannot completely exclude pathologic fractures although not favored.  Recommend correlation with trauma. 4.  No evidence of systemic disease otherwise.  5.  Mild metabolic activity associated with right lower lobe atelectasis is likely infectious or inflammatory.   Original Report Authenticated By: Suzy Bouchard, M.D.    Dg Chest Port 1 View  09/01/2012  *RADIOLOGY REPORT*  Clinical Data: Follow-up tracheostomy placement  PORTABLE CHEST - 1 VIEW  Comparison: 08/31/2012  Findings: Surgical clips project over the neck. Tracheostomy tube not visualized. Mild bibasilar opacity.  Heart size upper normal. Trace left pleural effusion not excluded.  No pneumothorax.  No acute osseous finding.  IMPRESSION: Mild bibasilar opacities; atelectasis versus infiltrate.   Original Report Authenticated By: Suanne Marker, M.D.    Dg Chest Port 1 View  08/31/2012  *RADIOLOGY REPORT*  Clinical Data: Post laryngectomy.  PORTABLE CHEST - 1 VIEW  Comparison: 08/26/2012.  Findings: Biapical pleural thickening without associate bony destruction noted on the preoperative exam.  Heart size top normal.  Mild central pulmonary vascular prominence without pulmonary edema.  No pneumothorax or segmental infiltrate.  IMPRESSION: No acute abnormality.   Original Report Authenticated By: Doug Sou, M.D.    Dg Swallowing Func-no Report  08/16/2012  CLINICAL DATA: Eval swallowing per ST recs.   FLUOROSCOPY FOR SWALLOWING FUNCTION STUDY:  Fluoroscopy was provided for swallowing function study, which was  administered by a speech pathologist.  Final results and recommendations  from this study  are contained within the speech pathology report.      Disposition: 06-Home-Health Care Svc      Discharge Orders    Future Appointments: Provider: Department: Dept Phone: Center:   09/20/2012 12:30 PM [facility] Nurse [facility] [id number] None   09/20/2012 1:00 PM Rexene Edison, MD [facility] [phone] None         Signed: [place], Zakaria Fromer 09/14/2012, 9:06 AM

## 2012-09-14 NOTE — Progress Notes (Signed)
Patient discharged to home with daughter.  Discharge teaching completed with daughter and patient, including follow up care, medications, tube feeding and PEG tube care.  Patient and daughter verbalize understanding with no further questions.  [facility] to follow up with patient and daughter at home.  Vital signs stable, no complaints of pain.

## 2012-09-15 DIAGNOSIS — Z5189 Encounter for other specified aftercare: Secondary | ICD-10-CM

## 2012-09-15 HISTORY — DX: Encounter for other specified aftercare: Z51.89

## 2012-09-20 ENCOUNTER — Ambulatory Visit
Admit: 2012-09-20 | Discharge: 2012-09-20 | Disposition: A | Discharge status: Home / Self Care | Payer: Medicaid Other | Attending: Radiation Oncology | Admitting: Radiation Oncology

## 2012-09-20 ENCOUNTER — Encounter: Payer: Self-pay | Admitting: Oncology

## 2012-09-20 ENCOUNTER — Encounter: Payer: Self-pay | Admitting: *Deleted

## 2012-09-20 ENCOUNTER — Encounter: Payer: Self-pay | Admitting: Radiation Oncology

## 2012-09-20 VITALS — BP 159/62 | HR 71 | Temp 99.2°F | Wt 108.0 lb

## 2012-09-20 DIAGNOSIS — Z9089 Acquired absence of other organs: Secondary | ICD-10-CM | POA: Insufficient documentation

## 2012-09-20 DIAGNOSIS — K1231 Oral mucositis (ulcerative) due to antineoplastic therapy: Secondary | ICD-10-CM | POA: Insufficient documentation

## 2012-09-20 DIAGNOSIS — R070 Pain in throat: Secondary | ICD-10-CM | POA: Insufficient documentation

## 2012-09-20 DIAGNOSIS — C320 Malignant neoplasm of glottis: Secondary | ICD-10-CM

## 2012-09-20 DIAGNOSIS — L988 Other specified disorders of the skin and subcutaneous tissue: Secondary | ICD-10-CM | POA: Insufficient documentation

## 2012-09-20 DIAGNOSIS — B370 Candidal stomatitis: Secondary | ICD-10-CM | POA: Insufficient documentation

## 2012-09-20 DIAGNOSIS — Z930 Tracheostomy status: Secondary | ICD-10-CM | POA: Insufficient documentation

## 2012-09-20 DIAGNOSIS — R042 Hemoptysis: Secondary | ICD-10-CM | POA: Insufficient documentation

## 2012-09-20 DIAGNOSIS — C770 Secondary and unspecified malignant neoplasm of lymph nodes of head, face and neck: Secondary | ICD-10-CM | POA: Insufficient documentation

## 2012-09-20 DIAGNOSIS — D491 Neoplasm of unspecified behavior of respiratory system: Secondary | ICD-10-CM | POA: Insufficient documentation

## 2012-09-20 DIAGNOSIS — Z510 Encounter for antineoplastic radiation therapy: Secondary | ICD-10-CM | POA: Insufficient documentation

## 2012-09-20 DIAGNOSIS — Y842 Radiological procedure and radiotherapy as the cause of abnormal reaction of the patient, or of later complication, without mention of misadventure at the time of the procedure: Secondary | ICD-10-CM | POA: Insufficient documentation

## 2012-09-20 DIAGNOSIS — C321 Malignant neoplasm of supraglottis: Secondary | ICD-10-CM

## 2012-09-20 HISTORY — DX: Gastrostomy status: Z93.1

## 2012-09-20 NOTE — Progress Notes (Signed)
Talked with Patient's daughter [name]) regarding financial assistance. [name] has Medicaid and wanting to apply for financial assistance. Discussed the POV guidelines and advised to return application with letter of support. [name] also requested to talk with a Education officer, museum, provided her with Lauren's contact information.

## 2012-09-20 NOTE — Progress Notes (Signed)
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

## 2012-09-20 NOTE — Progress Notes (Signed)
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 M0) squamous cell carcinoma of the supraglottic larynx. I saw the patient in consultation as an inpatient on 08/08/2012. There was a concern that she had invasion of the laryngeal cartilage, and total laryngectomy and neck dissection was recommended. She was initially seen by Dr. Lamonte Sakai of medical oncology, then her care transferred to Dr. Marcy Panning. Dr. [name] performed a total laryngectomy and bilateral selective neck dissections on 08/31/2012. On review of her pathology she is found to have, on the right, one level III lymph node with metastatic carcinoma and one level IV lymph node with metastatic carcinoma. Extracapsular tumor extension was seen. The remaining 24 lymph nodes the right neck were free of metastatic disease. With a left neck 20 lymph nodes were removed, all negative for metastatic disease. The primary tumor was arising from the supraglottic larynx and undermined the glottis with vocal cord fixation. The tumor spanned 4.6 cm in greatest dimension. The tumor abutted but did not invaded the thyroid cartilage. LV I. was present. Tumor was less than 0.1 cm to the right lateral soft tissue margin. She is doing well postoperatively and is quite anxious. Her PEG tube feedings are going well.  Physical examination: Alert and oriented. Wt Readings from Last 3 Encounters:  09/20/12 108 lb (48.988 kg)  09/20/12 108 lb (48.988 kg)  09/07/12 112 lb 10.5 oz (51.1 kg)   Temp Readings from Last 3 Encounters:  09/20/12 99.2 F (37.3 C)   09/20/12 99.2 F (37.3 C)   09/14/12 98.2 F (36.8 C) Oral   BP Readings from Last 3 Encounters:  09/20/12 159/62  09/20/12 159/62  09/14/12 136/67   Pulse Readings from Last 3 Encounters:  09/20/12 71  09/20/12 71  09/14/12  64    Head and neck examination: Surgical wounds are noted along the neck bilaterally. The wounds are healing well. The stoma site is clean. There is a fine papular erythematous rash along her lower neck and suprasternal region which has the appearance of an allergic reaction. Oral cavity: She is edentulous. Oral cavity and oropharynx unremarkable to inspection. Indirect mirror examination is suboptimal but is otherwise unremarkable. Neurologic examination grossly nonfocal.  Laboratory data:  Lab Results  Component Value Date   WBC 10.2 09/11/2012   HGB 10.5* 09/11/2012   HCT 32.1* 09/11/2012   MCV 86.5 09/11/2012   PLT 304 09/11/2012   Impression: Stage IV (T3 N2 B. 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 She needs postoperative radiation therapy. She does have adverse features including extracapsular nodal spread and a close surgical margin. With these adverse features, she should be offered postoperative chemoradiation. She indicates that she is not in favor of chemotherapy.  She is quite frail, and may not be a good chemotherapy candidate based on her performance status. I defer to Dr. Humphrey Rolls. I discussed the potential acute and late toxicities of radiation therapy and she wishes to proceed as outlined. I will have her return for simulation/treatment planning next week, and probably begin her radiation therapy the week of October 28. Consent is signed today. I should mention that she has been cleared by Dr. Enrique Sack for chemoradiation. He went to see her 1 month following completion of radiation therapy.  Plan: As discussed above.  30 minutes was  spent face-to-face the patient, primarily counseling the patient and coordinating her care.

## 2012-09-20 NOTE — Progress Notes (Addendum)
Grades pain as a level 9 right neck region.  She bega to cry and her daughter stated that prior to this Ms. Haling was independent, cutting the grass etc.  Now she needs assistance to ambulate and cannot eat, is in pain and cannot smoke as relayed by her daughter.  Accompanied by Daughter and interpreter.  Stoma with redness and rash around the stoma and below  on the upper chest.  Using Bacitracin ointment presently.  Peg in place and instilling 2 cans every 8 hours.

## 2012-09-21 ENCOUNTER — Telehealth: Payer: Self-pay | Admitting: Oncology

## 2012-09-21 NOTE — Telephone Encounter (Signed)
C/D 09/21/12 for appt 09/22/12

## 2012-09-21 NOTE — Telephone Encounter (Signed)
S/W pt in re NP appt 10/11 @ 1 w/ Dr. Humphrey Rolls Referring Dr. Valere Dross NP packet mailed.

## 2012-09-22 ENCOUNTER — Other Ambulatory Visit: Payer: Self-pay | Admitting: Emergency Medicine

## 2012-09-22 DIAGNOSIS — C320 Malignant neoplasm of glottis: Secondary | ICD-10-CM

## 2012-09-23 ENCOUNTER — Encounter: Payer: Self-pay | Admitting: Oncology

## 2012-09-23 ENCOUNTER — Ambulatory Visit: Payer: Medicaid Other

## 2012-09-23 ENCOUNTER — Other Ambulatory Visit: Payer: Medicaid Other | Admitting: Lab

## 2012-09-23 ENCOUNTER — Ambulatory Visit (HOSPITAL_BASED_OUTPATIENT_CLINIC_OR_DEPARTMENT_OTHER): Payer: Medicaid Other | Admitting: Oncology

## 2012-09-23 VITALS — BP 148/80 | HR 70 | Temp 98.6°F | Resp 20 | Ht 60.0 in | Wt 115.5 lb

## 2012-09-23 DIAGNOSIS — C320 Malignant neoplasm of glottis: Secondary | ICD-10-CM

## 2012-09-23 DIAGNOSIS — C321 Malignant neoplasm of supraglottis: Secondary | ICD-10-CM

## 2012-09-23 DIAGNOSIS — C770 Secondary and unspecified malignant neoplasm of lymph nodes of head, face and neck: Secondary | ICD-10-CM

## 2012-09-23 NOTE — Progress Notes (Signed)
Checked in patient. Daughter-in-law Janett Billow with her. She has financial application she will bring back to me. Patient has no income, they live with her and take care of any financial obligations she has. Bennie Pierini I need a letter of support from her and hubby along with application to be processed. She will bring back.

## 2012-09-23 NOTE — Progress Notes (Signed)
OFFICE PROGRESS NOTE  CC  Ann Gravel, MD [address] [place] [address] Dr. Arloa Koh  DIAGNOSIS: 66 year old female with stage IV (T3 N2 B. N0) squamous cell carcinoma of the supraglottic larynx  PRIOR THERAPY:  #1 patient is now status post total laryngectomy and neck dissection with the final pathology revealing on the right one level III lymph node with metastatic carcinoma in one level IV lymph node with metastatic carcinoma. Extracapsular extension was noted. Remaining 24 lymph nodes from the right neck were negative for metastatic disease. Left neck 20 lymph nodes were all negative for metastatic disease. Primary tumor arose from the supraglottic larynx and undermined the glottis with vocal cord fixation. Tumor measured 4.6 cm in greatest dimension. Tumor abutted but did not invade the thyroid cartilage lymphovascular invasion was noted tumor was less than 0.1 cm to the right lateral soft tissue margin.  #2 patient is status post PEG tube placement for feeding.  CURRENT THERAPY: Patient is scheduled to begin radiation therapy by Dr. Arloa Koh sometime later this month.  INTERVAL HISTORY: [name] 65 y.o. female returns for followup visit post discharge from the hospital. I had originally seen her in consultation in August 2013 for new diagnosis of squamous cell carcinoma of the supraglottic larynx. She was originally seen by Dr. Matthew Saras and subsequently I took over her care. Since then she has undergone a total laryngectomy and bilateral selective neck dissection on 08/31/2012. Her final pathology is above. Clinically today she seems to be doing well. She has seen Dr. Arloa Koh for consultation as an outpatient for discussion of radiation therapy post surgery. Dr. Valere Dross referred her to me for discussion of concurrent chemotherapy primarily consisting of weekly cis-platinum. Overall patient is doing well she has been using her feeding tube without  any problems.  MEDICAL HISTORY: Past Medical History  Diagnosis Date  . Hypercholesteremia     takes Pravastatin daily  . Uterine cancer   . COPD (chronic obstructive pulmonary disease) 08/10/2012  . Hiatal hernia 08/10/2012  . 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 Hypertension     takes Tribenzor and Atenolol daily  . Stroke 2011  . Headache   . GERD (gastroesophageal reflux disease)     takes Zantac daily  . Chronic back pain   . Constipation     related to pain meds  . Nausea     takes Zofran prn  . Anxiety     takes Ativan prn  . Depression   . Insomnia     takes Amitriptyline daily  . Broken ribs   . Blood transfusion without reported diagnosis 09/15/12    2 units Prbc's  . Gastrostomy in place     ALLERGIES:  is allergic to bacitracin.  MEDICATIONS:  Current Outpatient Prescriptions  Medication Sig Dispense Refill  . amitriptyline (ELAVIL) 10 MG tablet Take 1 tablet (10 mg total) by mouth at bedtime.  30 tablet  2  . amLODipine (NORVASC) 2.5 MG tablet Place 1 tablet (2.5 mg total) into feeding tube daily.  30 tablet  3  . antiseptic oral rinse (BIOTENE) LIQD 15 mLs by Mouth Rinse route 2 times daily at 12 noon and 4 pm.  237 mL  1  . atenolol (TENORMIN) 12.5 mg TABS Place 1 tablet (25 mg total) into feeding tube 2 (two) times daily.  30 tablet  3  . HYDROcodone-acetaminophen (LORTAB) 7.5-500 MG/15ML solution Take 10-15 mLs by mouth every 4 (four)  hours as needed.  480 mL  2  . LORazepam (ATIVAN) 1 MG tablet Take 1 tablet (1 mg total) by mouth 2 (two) times daily.  60 tablet  3  . metoCLOPramide (REGLAN) 5 MG/5ML solution Take 5 mLs (5 mg total) by mouth every 8 (eight) hours.  120 mL  3  . nicotine (NICODERM CQ - DOSED IN MG/24 HOURS) 21 mg/24hr patch Place 1 patch onto the skin daily.  28 patch  0  . Nutritional Supplements (FEEDING SUPPLEMENT, OSMOLITE 1.2 CAL,) LIQD Place 474 mLs into feeding tube every 8 (eight) hours.  180 mL  3  .  Water For Irrigation, Sterile (FREE WATER) SOLN Place 30-60 mLs into feeding tube every 6 (six) hours.      . fentaNYL (DURAGESIC - DOSED MCG/HR) 50 MCG/HR Place 1 patch (50 mcg total) onto the skin every 3 (three) days.  10 patch  0    SURGICAL HISTORY:  Past Surgical History  Procedure Date  . Abdominal surgery     r/t uterine carcinoma  . Tracheostomy tube placement 08/10/2012    Procedure: TRACHEOSTOMY;  Surgeon: Jerrell Belfast, MD;  Location: [facility];  Service: ENT;  Laterality: N/A;  . Laryngoscopy 08/10/2012    Procedure: LARYNGOSCOPY;  Surgeon: Jerrell Belfast, MD;  Location: [facility];  Service: ENT;  Laterality: N/A;  with biopsy  . Appendectomy   . Hernia repair   . Gastrostomy w/ feeding tube     placed last week  . Laryngetomy 08/31/2012    Procedure: LARYNGECTOMY;  Surgeon: Jerrell Belfast, MD;  Location: [facility];  Service: ENT;  Laterality: N/A;  . Radical neck dissection 08/31/2012    Procedure: RADICAL NECK DISSECTION;  Surgeon: Jerrell Belfast, MD;  Location: [facility];  Service: ENT;  Laterality: Bilateral;    REVIEW OF SYSTEMS:  Pertinent items are noted in HPI.   HEALTH MAINTENANCE:   PHYSICAL EXAMINATION: Blood pressure 148/80, pulse 70, temperature 98.6 F (37 C), temperature source Oral, resp. rate 20, height 5' (1.524 m), weight 115 lb 8 oz (52.39 kg). Body mass index is 22.56 kg/(m^2). ECOG PERFORMANCE STATUS: 1 - Symptomatic but completely ambulatory   General appearance: alert, cooperative and appears older than stated age Neck: Patient is noted to have a stoma present the surroundings wound from the radical neck dissection has healed well. She does have some erythema secondary to allergic reaction to bacitracin there are no palpable masses Resp: clear to auscultation bilaterally Back: negative, symmetric, no curvature. ROM normal. No CVA tenderness. Cardio: regular rate and rhythm GI: soft, non-tender; bowel sounds normal; no masses,  no  organomegaly Extremities: extremities normal, atraumatic, no cyanosis or edema Neurologic: Grossly normal   LABORATORY DATA: Lab Results  Component Value Date   WBC 10.2 09/11/2012   HGB 10.5* 09/11/2012   HCT 32.1* 09/11/2012   MCV 86.5 09/11/2012   PLT 304 09/11/2012      Chemistry      Component Value Date/Time   NA 135 09/11/2012 0039   K 4.9 09/11/2012 0039   CL 97 09/11/2012 0039   CO2 28 09/11/2012 0039   BUN 31* 09/11/2012 0039   CREATININE 1.12* 09/11/2012 0039      Component Value Date/Time   CALCIUM 9.7 09/11/2012 0039   ALKPHOS 88 09/08/2012 0610   AST 23 09/08/2012 0610   ALT 22 09/08/2012 0610   BILITOT 0.2* 09/08/2012 0610       RADIOGRAPHIC STUDIES:  Dg Chest 2 View  08/26/2012  *  RADIOLOGY REPORT*  Clinical Data: Preop for laryngectomy  CHEST - 2 VIEW  Comparison: 08/10/2012  Findings: Cardiomediastinal silhouette is stable.  Tracheostomy tube is unchanged in position.  No acute infiltrate or pleural effusion.  No pulmonary edema. Bony thorax is unremarkable.  IMPRESSION:  No active disease.  Stable tracheostomy tube position.   Original Report Authenticated By: Lahoma Crocker, M.D.    Nm Pet Image Initial (pi) Skull Base To Thigh  08/30/2012  *RADIOLOGY REPORT*  Clinical Data: Initial treatment strategy for head and neck. 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 CT data was obtained and used for attenuation correction and anatomic localization only.  (This was not acquired as a diagnostic CT examination.) Additional exam technical data entered on technologist worksheet.  Comparison:  Neck CT 08/08/2012  Findings:  Neck: There is an  intensely hypermetabolic mass centered within the posterior right aspect of the glottis with SUV max = [id number]  Hypermetabolic enlarged right level II lymph node measuring 18 mm (image 27) with SUV max = 7.5  consistent with local nodal metastasis.  No additional evidence of metastatic disease in the neck.  There is intense activity associated with tracheostomy site related to inflammation from recent surgery.  Chest:  Within the left chest wall,  there is focal intense hypermetabolic active associated with two adjacent fractures within the left lateral fifth and sixth ribs (images 65 and 74 respectively).  There is mild callus formation at the fracture sites.  No hypermetabolic mediastinal lymph nodes.  There is small focus of atelectasis at the right lung base (image 89) with associated metabolic activity which is likely inflammatory or infectious.  No suspicious pulmonary nodules.  Abdomen/Pelvis:  No abnormal hypermetabolic activity within the liver, pancreas, adrenal glands, or spleen.  No hypermetabolic lymph nodes in the abdomen or pelvis.  Skeleton:  No focal hypermetabolic activity to suggest skeletal metastasis. Left rib fractures as above.  IMPRESSION:  1.  Intensely hypermetabolic primary carcinoma within the posterior right glottis. 2.  Local nodal metastasis to the right level II station. 3.  Intense uptake in adjacent left lateral ribs fractures are in a traumatic pattern.  Cannot completely exclude pathologic fractures although not favored.  Recommend correlation with trauma. 4.  No evidence of systemic disease otherwise.  5.  Mild metabolic activity associated with right lower lobe atelectasis is likely infectious or inflammatory.   Original Report Authenticated By: Suzy Bouchard, M.D.    Dg Chest Port 1 View  09/01/2012  *RADIOLOGY REPORT*  Clinical Data: Follow-up tracheostomy placement  PORTABLE CHEST - 1 VIEW  Comparison: 08/31/2012  Findings: Surgical clips project over the neck. Tracheostomy tube not visualized. Mild bibasilar opacity.  Heart size upper normal. Trace left pleural effusion not excluded.  No pneumothorax.  No acute osseous finding.  IMPRESSION: Mild bibasilar opacities;  atelectasis versus infiltrate.   Original Report Authenticated By: Suanne Marker, M.D.    Dg Chest Port 1 View  08/31/2012  *RADIOLOGY REPORT*  Clinical Data: Post laryngectomy.  PORTABLE CHEST - 1 VIEW  Comparison: 08/26/2012.  Findings: Biapical pleural thickening without associate bony destruction noted on the preoperative exam.  Heart size top normal.  Mild central pulmonary vascular prominence without pulmonary edema.  No pneumothorax or segmental infiltrate.  IMPRESSION: No acute abnormality.   Original Report Authenticated By: Doug Sou, M.D.  ASSESSMENT: 66 year old female with stage IV supraglottic laryngeal carcinoma squamous cell. Patient is status post total laryngectomy and bilateral neck dissection on 08/31/2012. She was found to have positive lymph nodes with extracapsular extension tumor measured 4.6 cm. The patient is being considered for radiation therapy. She is also a candidate for concurrent weekly cisplatin him. We had discussed this well patient was recently in the hospital the possibility of doing chemotherapy however patient is completely against it. Today we had an extensive discussion as well. Currently patient is not in favor of doing chemotherapy. She just wants to do radiation. She feels that she will become 2 weeks when she gets chemotherapy she is concerned about the side effects that she may experience with chemotherapy.   PLAN:   #1 patient tentatively has declined any form of concurrent chemotherapy.  #2 however she is open to seeing me periodically for followup. I have therefore set her up to be seen by me in about 8 weeks time. Of course I can see her sooner if need arises   All questions were answered. The patient knows to call the clinic with any problems, questions or concerns. We can certainly see the patient much sooner if necessary.  I spent 25 minutes counseling the patient face to face. The total time spent in the appointment was 30  minutes.    Marcy Panning, MD Medical/Oncology [facility] [phone] (beeper) [phone] (Office)  09/23/2012, 2:31 PM

## 2012-09-23 NOTE — Patient Instructions (Addendum)
If you change your mind about chemotherapy please call, otherwise I will see you back in 8 weeks

## 2012-09-27 ENCOUNTER — Encounter: Payer: Self-pay | Admitting: Radiation Oncology

## 2012-09-27 ENCOUNTER — Encounter (HOSPITAL_COMMUNITY): Payer: Self-pay | Admitting: *Deleted

## 2012-09-27 ENCOUNTER — Ambulatory Visit: Admission: RE | Admit: 2012-09-27 | Payer: Medicaid Other | Source: Ambulatory Visit | Admitting: Radiation Oncology

## 2012-09-27 ENCOUNTER — Emergency Department (HOSPITAL_COMMUNITY)
Admission: EM | Admit: 2012-09-27 | Discharge: 2012-09-27 | Disposition: A | Discharge status: Home / Self Care | Payer: Medicaid Other | Attending: Emergency Medicine | Admitting: Emergency Medicine

## 2012-09-27 DIAGNOSIS — G8929 Other chronic pain: Secondary | ICD-10-CM | POA: Insufficient documentation

## 2012-09-27 DIAGNOSIS — C768 Malignant neoplasm of other specified ill-defined sites: Secondary | ICD-10-CM | POA: Insufficient documentation

## 2012-09-27 DIAGNOSIS — K219 Gastro-esophageal reflux disease without esophagitis: Secondary | ICD-10-CM | POA: Insufficient documentation

## 2012-09-27 DIAGNOSIS — Z8673 Personal history of transient ischemic attack (TIA), and cerebral infarction without residual deficits: Secondary | ICD-10-CM | POA: Insufficient documentation

## 2012-09-27 DIAGNOSIS — IMO0002 Reserved for concepts with insufficient information to code with codable children: Secondary | ICD-10-CM

## 2012-09-27 DIAGNOSIS — E7800 Pure hypercholesterolemia, unspecified: Secondary | ICD-10-CM | POA: Insufficient documentation

## 2012-09-27 DIAGNOSIS — G4700 Insomnia, unspecified: Secondary | ICD-10-CM | POA: Insufficient documentation

## 2012-09-27 DIAGNOSIS — E875 Hyperkalemia: Secondary | ICD-10-CM | POA: Insufficient documentation

## 2012-09-27 DIAGNOSIS — Z8542 Personal history of malignant neoplasm of other parts of uterus: Secondary | ICD-10-CM | POA: Insufficient documentation

## 2012-09-27 DIAGNOSIS — I10 Essential (primary) hypertension: Secondary | ICD-10-CM | POA: Insufficient documentation

## 2012-09-27 DIAGNOSIS — Z87891 Personal history of nicotine dependence: Secondary | ICD-10-CM | POA: Insufficient documentation

## 2012-09-27 LAB — POCT I-STAT, CHEM 8
BUN: 47 mg/dL — ABNORMAL HIGH (ref 6–23)
Calcium, Ion: 1.04 mmol/L — ABNORMAL LOW (ref 1.13–1.30)
Creatinine, Ser: 1.3 mg/dL — ABNORMAL HIGH (ref 0.50–1.10)
TCO2: 23 mmol/L (ref 0–100)

## 2012-09-27 LAB — BASIC METABOLIC PANEL
CO2: 24 mEq/L (ref 19–32)
Calcium: 9.7 mg/dL (ref 8.4–10.5)
Glucose, Bld: 151 mg/dL — ABNORMAL HIGH (ref 70–99)
Sodium: 132 mEq/L — ABNORMAL LOW (ref 135–145)

## 2012-09-27 LAB — CBC
MCH: 28 pg (ref 26.0–34.0)
MCV: 85.9 fL (ref 78.0–100.0)
Platelets: 295 10*3/uL (ref 150–400)
RBC: 4.03 MIL/uL (ref 3.87–5.11)

## 2012-09-27 NOTE — Progress Notes (Addendum)
Patient escorted to nursing by Haskel Schroeder reports that she was escorting the patient and her daughter from the elevator to CT/SIM but, patient reported she was very dizzy. Escorted patient to chair in exam room. Assessed vitals sitting and standing. Blood pressure sitting 159/79 while standing 207/75. Daughter reports that her mother is taking her blood pressure medication as directed. Patient reports that she feels very nauseated and dizzy. Patient's daughter reports that the patient took a dose of zofran at 1200. Applied a cool cloth to the back of the patient's neck and provided an emesis basin. Patient did not vomit. Patient crying and shaking. Per Dr. Charlton Amor order CT/SIM cancelled for today to be rescheduled next week. Notified Jenna,RT. Malachy Mood, RN escorted the patient to triage in the emergency department. Phoned charge nurse in emergency department, Diane, RN.

## 2012-09-27 NOTE — ED Provider Notes (Signed)
History     CSN: [id number]  Arrival date & time 09/27/12  6   First MD Initiated Contact with Patient 09/27/12 1421      Chief Complaint  Patient presents with  . Dizziness  . ca pt sent from radiation dept     (Consider location/radiation/quality/duration/timing/severity/associated sxs/prior treatment) HPI  Patient sent to the ED from cancer center after having elevated BP spike and feelin transiently dizzy while receiving treatments She feels fine here in the ER. Her BP is 144/71 now without medications and  says she feels fine. The son and daughter in law are frustrated that she was sent to the ER and tell me that she is acting normal and looks fine. She denies having any symptoms at this time aside from her baseline. VSS/NAD  Past Medical History  Diagnosis Date  . Hypercholesteremia     takes Pravastatin daily  . Uterine cancer   . COPD (chronic obstructive pulmonary disease) 08/10/2012  . Hiatal hernia 08/10/2012  . 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 Hypertension     takes Tribenzor and Atenolol daily  . Stroke 2011  . Headache   . GERD (gastroesophageal reflux disease)     takes Zantac daily  . Chronic back pain   . Constipation     related to pain meds  . Nausea     takes Zofran prn  . Anxiety     takes Ativan prn  . Depression   . Insomnia     takes Amitriptyline daily  . Broken ribs   . Blood transfusion without reported diagnosis 09/15/12    2 units Prbc's  . Gastrostomy in place     Past Surgical History  Procedure Date  . Abdominal surgery     r/t uterine carcinoma  . Tracheostomy tube placement 08/10/2012    Procedure: TRACHEOSTOMY;  Surgeon: Jerrell Belfast, MD;  Location: [facility];  Service: ENT;  Laterality: N/A;  . Laryngoscopy 08/10/2012    Procedure: LARYNGOSCOPY;  Surgeon: Jerrell Belfast, MD;  Location: [facility];  Service: ENT;  Laterality: N/A;  with biopsy  . Appendectomy   . Hernia repair   . Gastrostomy w/  feeding tube     placed last week  . Laryngetomy 08/31/2012    Procedure: LARYNGECTOMY;  Surgeon: Jerrell Belfast, MD;  Location: [place];  Service: ENT;  Laterality: N/A;  . Radical neck dissection 08/31/2012    Procedure: RADICAL NECK DISSECTION;  Surgeon: Jerrell Belfast, MD;  Location: [place];  Service: ENT;  Laterality: Bilateral;    History reviewed. No pertinent family history.  History  Substance Use Topics  . Smoking status: Former Smoker -- 2.0 packs/day for 50 years    Quit date: 08/08/2012  . Smokeless tobacco: Never Used   Comment: quit 08/08/12  . Alcohol Use: No    OB History    Grav Para Term Preterm Abortions TAB SAB Ect Mult Living                  Review of Systems   Review of Systems  Gen: no weight loss, fevers, chills, night sweats  Eyes: no discharge or drainage, no occular pain or visual changes  Nose: no epistaxis or rhinorrhea  Mouth: no dental pain, no sore throat  Neck: no neck pain  Lungs:No wheezing, coughing or hemoptysis CV: no chest pain, palpitations, dependent edema or orthopnea, transient hypertension Abd: no abdominal pain, nausea, vomiting  GU: no dysuria or gross hematuria  MSK:  No abnormalities  Neuro: no headache, no focal neurologic deficits, transient brief dizziness Skin: no abnormalities Psyche: negative.    Allergies  Bacitracin  Home Medications   Current Outpatient Rx  Name Route Sig Dispense Refill  . AMITRIPTYLINE HCL 10 MG PO TABS Oral Take 1 tablet (10 mg total) by mouth at bedtime. 30 tablet 2  . AMLODIPINE BESYLATE 2.5 MG PO TABS Per Tube Place 1 tablet (2.5 mg total) into feeding tube daily. 30 tablet 3  . BIOTENE DRY MOUTH MT LIQD Mouth Rinse 15 mLs by Mouth Rinse route 2 times daily at 12 noon and 4 pm. 237 mL 1  . ATENOLOL 12.5 MG HALF TABLET Per Tube Place 1 tablet (25 mg total) into feeding tube 2 (two) times daily. 30 tablet 3  . FENTANYL 50 MCG/HR TD PT72 Transdermal Place 1 patch (50 mcg total) onto the  skin every 3 (three) days. 10 patch 0  . HYDROCODONE-ACETAMINOPHEN 7.5-500 MG/15ML PO SOLN Oral Take 10-15 mLs by mouth every 4 (four) hours as needed. 480 mL 2  . LORAZEPAM 1 MG PO TABS Oral Take 1 tablet (1 mg total) by mouth 2 (two) times daily. 60 tablet 3  . METOCLOPRAMIDE HCL 5 MG/5ML PO SOLN Oral Take 5 mLs (5 mg total) by mouth every 8 (eight) hours. 120 mL 3  . NICOTINE 21 MG/24HR TD PT24 Transdermal Place 1 patch onto the skin daily. 28 patch 0  . OSMOLITE 1.2 CAL PO LIQD Per Tube Place 474 mLs into feeding tube every 8 (eight) hours. 180 mL 3  . FREE WATER Per Tube Place 30-60 mLs into feeding tube every 6 (six) hours.      BP 134/68  Pulse 82  Temp 98.6 F (37 C)  SpO2 98%  Physical Exam  Nursing note and vitals reviewed. Constitutional: She appears well-developed and well-nourished. No distress.  HENT:  Head: Normocephalic and atraumatic.  Eyes: Pupils are equal, round, and reactive to light.  Neck: Normal range of motion. Neck supple.    Cardiovascular: Normal rate and regular rhythm.   Pulmonary/Chest: Effort normal.  Abdominal: Soft.  Neurological: She is alert. No cranial nerve deficit (3-12 intact) or sensory deficit.       Motor strength at baseline per patient.  Skin: Skin is warm and dry.    ED Course  Procedures (including critical care time)  Labs Reviewed  GLUCOSE, CAPILLARY - Abnormal; Notable for the following:    Glucose-Capillary 152 (*)     All other components within normal limits  BASIC METABOLIC PANEL - Abnormal; Notable for the following:    Sodium 132 (*)     Potassium 5.3 (*)     Chloride 94 (*)     Glucose, Bld 151 (*)     BUN 41 (*)     GFR calc non Af Amer 70 (*)     GFR calc Af Amer 81 (*)     All other components within normal limits  CBC - Abnormal; Notable for the following:    WBC 11.9 (*)     Hemoglobin 11.3 (*)     HCT 34.6 (*)     All other components within normal limits  POCT I-STAT, CHEM 8 - Abnormal; Notable for  the following:    Sodium 133 (*)     Potassium 5.5 (*)     BUN 47 (*)     Creatinine, Ser 1.30 (*)     Glucose, Bld 150 (*)  Calcium, Ion 1.04 (*)     Hemoglobin 11.6 (*)     HCT 34.0 (*)     All other components within normal limits   No results found.   1. Hyperkalemia   2. Hypertension   3. Squamous cell carcinoma       MDM   Date: 09/27/2012  Rate: 77  Rhythm: normal sinus rhythm  QRS Axis: normal  Intervals: normal  ST/T Wave abnormalities: normal  Conduction Disutrbances:none  Narrative Interpretation:   Old EKG Reviewed: unchanged from Sept 13, 2013  Pt has elevated potassium at 5.3, however she has no EKG changes. Pt reviewed with Dr. Karle Starch.  PT and family really want to go home. She is feeling much better. She has been advised that she needs to have her potassium rechecked in the next couple of days.  Pt has been advised of the symptoms that warrant their return to the ED. Patient has voiced understanding and has agreed to follow-up with the PCP or specialist.           Linus Mako, [place] 09/27/12 [place], [place] 09/27/12 1449

## 2012-09-27 NOTE — ED Provider Notes (Signed)
Medical screening examination/treatment/procedure(s) were performed by non-physician practitioner and as supervising physician I was immediately available for consultation/collaboration.   Charles B. Karle Starch, MD 09/27/12 1451

## 2012-09-27 NOTE — ED Notes (Addendum)
Radiation rn reports that pt had stoma placed 1 month ago for throat cancer. Stoma still healing. Today pt was seen in radiation dept for radiation planning. During appt pt had orthostatic vital signs taken and blood pressure was high and that pt was dizzy. Pt had hx of stroke in 2011. Pt now takes HTN medication.   Today the only medication that has changed, pt had fentanyl patch applied yesterday.   Pt speaks spanish, daughter translates, pt also does not speak.  Radiation oncologist # [phone]

## 2012-10-04 ENCOUNTER — Ambulatory Visit
Admission: RE | Admit: 2012-10-04 | Discharge: 2012-10-04 | Disposition: A | Discharge status: Home / Self Care | Payer: Medicaid Other | Source: Ambulatory Visit | Attending: Radiation Oncology | Admitting: Radiation Oncology

## 2012-10-04 DIAGNOSIS — C320 Malignant neoplasm of glottis: Secondary | ICD-10-CM

## 2012-10-04 NOTE — Progress Notes (Signed)
Simulation/treatment planning note: The patient was taken to the CT simulator. She was placed supine. A custom head cast was constructed for immobilization. Her left and right neck scars were marked with radiopaque wires. She was then scanned. I personally contoured her CTV 6300, her close surgical margin, CTV 6000, her high-risk nodal bed, his CTV 5400, her intermediate risk nodal bed. I'm expanding the structures by 0.5 cm to create perspective PTV 6300, PTV 6000, and PTV 5400. Also contoured her left right parotid glands, esophagus, pharynx, oral cavity, and stoma. The remaining avoidance structures be contoured by dosimetry. She is now ready for IMRT simulation/treatment planning. The respective PTV's will receive 6300 cGy, 6000 cGy in 5400 cGy in 30 sessions with 6 MV photons helical IMRT Tomotherapy. I requesting daily MV CT, setting up to her spine and soft tissues prior to each treatment.

## 2012-10-11 ENCOUNTER — Encounter: Payer: Self-pay | Admitting: Radiation Oncology

## 2012-10-11 NOTE — Progress Notes (Signed)
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 IMRT was chosen to decrease the risk for permanent xerostomia compared to conventional or 3-D conformal radiation therapy. Dose volume histograms were obtained for our target structures including the high-risk tumor bed (PTV 6300), intermediate risk nodal bed (PTV 6000) and low risk nodal bed (PTV 5400). We met our target goals. We met the majority of her avoidance goals although a significant portion of the esophagus and pharyngeal constrictions receive 6300 cGy. She is at risk for pharyngeal stenosis, but also a significant risk for recurrent disease along her high-risk tumor bed. She'll undergo daily MV CT setting up to her cervical spine and soft tissues on a daily basis. Please see the electronic medical record for details of her dose volume histograms.

## 2012-10-17 ENCOUNTER — Ambulatory Visit
Admission: RE | Admit: 2012-10-17 | Discharge: 2012-10-17 | Disposition: A | Discharge status: Home / Self Care | Payer: Medicaid Other | Source: Ambulatory Visit | Attending: Radiation Oncology | Admitting: Radiation Oncology

## 2012-10-17 ENCOUNTER — Encounter: Payer: Self-pay | Admitting: Radiation Oncology

## 2012-10-17 ENCOUNTER — Ambulatory Visit: Payer: Medicaid Other | Attending: Otolaryngology

## 2012-10-17 ENCOUNTER — Ambulatory Visit: Payer: Medicaid Other

## 2012-10-17 ENCOUNTER — Ambulatory Visit
Admission: RE | Admit: 2012-10-17 | Discharge: 2012-10-17 | Disposition: A | Discharge status: Home / Self Care | Payer: Medicaid Other | Source: Ambulatory Visit | Admitting: Radiation Oncology

## 2012-10-17 VITALS — BP 168/92 | HR 99 | Resp 18

## 2012-10-17 DIAGNOSIS — F419 Anxiety disorder, unspecified: Secondary | ICD-10-CM

## 2012-10-17 DIAGNOSIS — C320 Malignant neoplasm of glottis: Secondary | ICD-10-CM

## 2012-10-17 MED ORDER — LORAZEPAM 1 MG PO TABS
ORAL_TABLET | ORAL | Status: AC
  Filled 2012-10-17: qty 1

## 2012-10-17 MED ORDER — LORAZEPAM 1 MG PO TABS
1.0000 mg | ORAL_TABLET | Freq: Once | ORAL | Status: AC
  Administered 2012-10-17: 1 mg via SUBLINGUAL
  Filled 2012-10-17: qty 1

## 2012-10-17 NOTE — Progress Notes (Signed)
Received patient in the clinic tonight accompanied by her son and daughter requesting to be seen by a physician because of increased anxiety associated with treatment. Patient alert and oriented to person, place, and time. Steady gait noted. Patient tearful and frightened related to unknowns of initial radiation treatment. Blood pressure and heart rate slightly elevated . Patient wearing Fentanyl 50 mcg patch and Nicotine patch. Patient reports taking ativan 1 mg at 0600 today and hydrocodone for pain at 1700. Reported all findings to Dr. Sondra Come. Worked with Patric Dykes, RN to calm patient. Administered Ativan 1 mg sinlingual as ordered by Dr. Sondra Come.

## 2012-10-18 ENCOUNTER — Encounter: Payer: Self-pay | Admitting: Radiation Oncology

## 2012-10-18 ENCOUNTER — Ambulatory Visit
Admission: RE | Admit: 2012-10-18 | Discharge: 2012-10-18 | Disposition: A | Discharge status: Home / Self Care | Payer: Medicaid Other | Source: Ambulatory Visit | Admitting: Radiation Oncology

## 2012-10-18 ENCOUNTER — Telehealth: Payer: Self-pay | Admitting: *Deleted

## 2012-10-18 ENCOUNTER — Ambulatory Visit
Admission: RE | Admit: 2012-10-18 | Discharge: 2012-10-18 | Disposition: A | Discharge status: Home / Self Care | Payer: Medicaid Other | Source: Ambulatory Visit | Attending: Radiation Oncology | Admitting: Radiation Oncology

## 2012-10-18 ENCOUNTER — Encounter: Payer: Self-pay | Admitting: Oncology

## 2012-10-18 ENCOUNTER — Other Ambulatory Visit: Payer: Self-pay | Admitting: Radiation Oncology

## 2012-10-18 VITALS — BP 132/69 | HR 78 | Temp 98.4°F | Resp 16 | Wt 119.9 lb

## 2012-10-18 DIAGNOSIS — C320 Malignant neoplasm of glottis: Secondary | ICD-10-CM

## 2012-10-18 MED ORDER — FENTANYL 50 MCG/HR TD PT72: 1.0000 | MEDICATED_PATCH | TRANSDERMAL | Status: DC

## 2012-10-18 NOTE — Progress Notes (Signed)
Processed patient's application for assistance. 1 family- no income her son/daughter-in-law take care of her/letter of support. 100% indigent 10/18/12-04/17/13. Sending letter and card to patient.

## 2012-10-18 NOTE — Progress Notes (Signed)
Tomotherapy segmentation note: On 10/17/2012 the patient underwent Tomotherapy segmentation in the management of her carcinoma of the larynx. She was treated to 8.2 delivered field widths corresponding to one set of IMRT treatment devices [phone]).

## 2012-10-18 NOTE — Telephone Encounter (Signed)
Called patient to inform of appt. For speech/swallow eval on 10-24-12 at 3:15 pm with Dr. Garald Balding, spoke with patient's daughter, Janett Billow and they are aware of this appt.

## 2012-10-18 NOTE — Progress Notes (Signed)
Weekly Management Note:  Site: Neck Current Dose:  420  cGy Projected Dose: 6300  cGy  Narrative: The patient is seen today for routine under treatment assessment. CBCT/MVCT images/port films were reviewed. The chart was reviewed. Her MV CT shows an excellent registration.  She remains quite anxious. In order has been placed for her to receiveg speech therapy and also physical therapy for her left shoulder discomfort. She has only four 50 mcg fentanyl patches left  and prescription for 10 more (one month supply) is written today to give adequate time for Medicaid coverage. She uses Ativan as a premedication. She'll contact.for [employer] office for advice/refill on continuation of a nicotine taper for her nicotine patches which I have little experience with.  Physical Examination:  Filed Vitals:   10/18/12 1734  BP: 132/69  Pulse: 78  Temp: 98.4 F (36.9 C)  Resp: 16  .  Weight: 119 lb 14.4 oz (54.386 kg). No change in examination.  Impression: Tolerating radiation therapy well. She is considering discontinuation of her radiation therapy, but her family convinces her to continue with therapy as planned.  Plan: Continue radiation therapy as planned.

## 2012-10-18 NOTE — Progress Notes (Signed)
Received patient in the clinic today accompanied by her daughter and son for a PUT with Dr. Valere Dross. Patient alert and oriented to person, place, and time. No distress noted. Steady gait noted. Pleasant affect noted. Patient much calmer today. Patient's daughter gave her ativan SL 1 mg just 10 minutes ago.  Patient requesting fentanyl patch refill. Patient reports because of medicaid it took a month to get the last script filled. Patient reports she only has four patches left. Family looking direction on nicotine patch. Patient prescribed patch while in hospital and has 15 patches left. Patient reports that she continues to crave cigarettes. Patient reports that the pain in her left shoulder continues. Nutrition consult set up. 11 pound weigh gain since 09/20/2012. Patient taking robitussin for cough. Patient reports coughing up brownish tinged sputum. Denies hemoptysis.  Reported all findings to Dr. Valere Dross.

## 2012-10-19 ENCOUNTER — Ambulatory Visit
Admission: RE | Admit: 2012-10-19 | Discharge: 2012-10-19 | Disposition: A | Discharge status: Home / Self Care | Payer: Medicaid Other | Source: Ambulatory Visit | Attending: Radiation Oncology | Admitting: Radiation Oncology

## 2012-10-19 DIAGNOSIS — C320 Malignant neoplasm of glottis: Secondary | ICD-10-CM

## 2012-10-19 MED ORDER — BIAFINE EX EMUL
CUTANEOUS | Status: DC | PRN
  Administered 2012-10-19: 1 via TOPICAL

## 2012-10-19 NOTE — Progress Notes (Signed)
Education performed today with patient and her daughter who served as an Astronomer today.  Reviewed skin care management of her neck with directions to avoid any products near the stoma.  Instructions to apply Biafine BID to her neck daily following treatment then again at bedtime.  Reviewed management of sore throat, and mouth soreness and informed that she may experience thickened saliva during treatment and to report when it occurs.  Both agreed.  Reviewed management of pain and fatigue management and reviewed with family Peg Tube care.  Instilling )Osmolite 1.2 via Peg and is tolerating without difficulty. Refused another copy of consent form and her schedule.  Given radiation therapy and You booklet with the pages marked on topics discussed.  Has appt. With the dietician and appt. Has been made with outpatient rehabilitation for limited mobility and pain in her left shoulder.

## 2012-10-20 ENCOUNTER — Ambulatory Visit
Admission: RE | Admit: 2012-10-20 | Discharge: 2012-10-20 | Disposition: A | Discharge status: Home / Self Care | Payer: Medicaid Other | Source: Ambulatory Visit | Attending: Radiation Oncology | Admitting: Radiation Oncology

## 2012-10-20 ENCOUNTER — Encounter: Payer: Self-pay | Admitting: Radiation Oncology

## 2012-10-20 VITALS — BP 138/70 | HR 75 | Temp 99.3°F | Resp 20

## 2012-10-20 DIAGNOSIS — C320 Malignant neoplasm of glottis: Secondary | ICD-10-CM

## 2012-10-20 NOTE — Progress Notes (Signed)
The patient is seen today after her fourth postoperative radiation therapy treatment for her carcinoma of the supraglottic larynx metastatic to the neck. Her major complaint is that of crusting of her stoma, and she is concerned that she may be developing stenosis. She has not been using a humidifier.  On examination today she is in no respiratory distress. O2 sat is 100%. Dry mucous is removed from the stoma, and there is some narrowing of the stoma.  Impression: Satisfactory progress, however I told her that Dr. [name] would be a better position to judge whether not she was developing stenosis of her stoma. I do suggest that she use a humidifier and stay hydrated.  Plan: Continue radiation therapy as planned. She'll make an appointment to see Dr. [name].

## 2012-10-20 NOTE — Progress Notes (Signed)
[name] concerned because she had a hard crust of secretions from her stoma.  She states she does not use humidification because it blows "so hard" that it causes her to cough "a lot" during the night.  Explained to her and her daughter, who serves as the interpreter, that the humidification will help to prevent the crusting of secretions in the stoma and will keep the area moist and aid her breathing. Suggested she use it during the day if she can't tolerate it at night and they both agreed.  This RN will call advanced home care to ascertain if their is an alternative to the humidification system she is using currently.  Currently researching how she can obtain an electrolarynx in a timely fashion.  Calls have been made to the vocational Rehabilitation area of [place] and was informed that it can take 4-6 months to obtain the electrolarynx because of their process.  During hospitalization [name] was offered an electrolarynx but refused because, as her daughter reports, the noise from the device scared her and at that point in time she was too upset.

## 2012-10-20 NOTE — Progress Notes (Addendum)
Called Dr. Victorio Palm office and left voice message for the nurse to relay that [name] is concerned that her stoma is closing and she is experiencing more SOB.  She has received 3 fractions to her neck.  She will be assessed today by Dr. Valere Dross.

## 2012-10-21 ENCOUNTER — Ambulatory Visit
Admission: RE | Admit: 2012-10-21 | Discharge: 2012-10-21 | Disposition: A | Discharge status: Home / Self Care | Payer: Medicaid Other | Source: Ambulatory Visit | Attending: Radiation Oncology | Admitting: Radiation Oncology

## 2012-10-24 ENCOUNTER — Ambulatory Visit: Payer: Medicaid Other

## 2012-10-24 ENCOUNTER — Ambulatory Visit: Payer: Self-pay

## 2012-10-24 ENCOUNTER — Ambulatory Visit
Admission: RE | Admit: 2012-10-24 | Discharge: 2012-10-24 | Disposition: A | Discharge status: Home / Self Care | Payer: Medicaid Other | Source: Ambulatory Visit | Attending: Radiation Oncology | Admitting: Radiation Oncology

## 2012-10-24 ENCOUNTER — Encounter: Payer: Self-pay | Admitting: Radiation Oncology

## 2012-10-24 VITALS — BP 151/58 | HR 74 | Temp 99.0°F | Resp 20 | Wt 119.9 lb

## 2012-10-24 DIAGNOSIS — C320 Malignant neoplasm of glottis: Secondary | ICD-10-CM

## 2012-10-24 NOTE — Progress Notes (Signed)
Weekly Management Note:  Site: Neck Current Dose:  1260  cGy Projected Dose: 6300  cGy  Narrative: The patient is seen today for routine under treatment assessment. CBCT/MVCT images/port films were reviewed. The chart was reviewed.   She is without new complaints today. She continues to have dry crusting and occasional bleeding from her stoma. She will see Dr. [name] tomorrow. Her PEG tube is functioning well and her weight remained stable.  Physical Examination:  Filed Vitals:   10/24/12 1158  BP: 151/58  Pulse: 74  Temp: 99 F (37.2 C)  Resp: 20  .  Weight: 119 lb 14.4 oz (54.386 kg). There is faint erythema the skin along the neck, bilaterally. No desquamation. The stoma is slightly narrowed. No hemorrhage or crusting noted. Oral cavity unremarkable to inspection.  Impression: Tolerating radiation therapy well. I explained to the patient and her family that she can expect some degree of esophagitis within the next few weeks. She'll see Dr. [name] tomorrow for advice on humidification of her stoma.  Plan: Continue radiation therapy as planned.

## 2012-10-24 NOTE — Progress Notes (Signed)
Patient here for weekly rad txs, neck: 6/30 txs compelted, patient and family in with her , her stoma is clean, no crusting, has peg tube, gets 2 cans osmolite every 8 hours with free water before and after feedings, patient states side of right neck/throat area hurts a little, does biotene rinses occasionally, not daily, encouraged to use as directed, also encouraged patient to stay sitting up after feedings at least 1/2 hour to 45 minutes, patient tongue moist ,no c/o headache,nausea  12:05 PM

## 2012-10-25 ENCOUNTER — Ambulatory Visit
Admission: RE | Admit: 2012-10-25 | Discharge: 2012-10-25 | Disposition: A | Discharge status: Home / Self Care | Payer: Medicaid Other | Source: Ambulatory Visit | Attending: Radiation Oncology | Admitting: Radiation Oncology

## 2012-10-26 ENCOUNTER — Ambulatory Visit
Admission: RE | Admit: 2012-10-26 | Discharge: 2012-10-26 | Disposition: A | Discharge status: Home / Self Care | Payer: Medicaid Other | Source: Ambulatory Visit | Attending: Radiation Oncology | Admitting: Radiation Oncology

## 2012-10-26 ENCOUNTER — Telehealth: Payer: Self-pay | Admitting: *Deleted

## 2012-10-26 NOTE — Telephone Encounter (Signed)
[name] called stating patient saw her Dr.Shumaker yesterday patient was coughing up slight blood specks with her sputum, she started using her humidifier because her throat  was getting dry, but daughter has said patient coughed up a  Finger nail size blood clot today, and patient feels that the humidifier is causing her to cough and irritate her throat more, asked to be seen tomorrow , asked if patient was coughing a lot,"No," no fever either, patient can see MD on call tomorrow after treatment informed daughter if still coughing up blood 3:38 PM

## 2012-10-27 ENCOUNTER — Ambulatory Visit
Admission: RE | Admit: 2012-10-27 | Discharge: 2012-10-27 | Disposition: A | Discharge status: Home / Self Care | Payer: Medicaid Other | Source: Ambulatory Visit | Attending: Radiation Oncology | Admitting: Radiation Oncology

## 2012-10-27 ENCOUNTER — Encounter: Payer: Self-pay | Admitting: Radiation Oncology

## 2012-10-27 VITALS — BP 146/75 | HR 73 | Temp 97.8°F | Resp 20

## 2012-10-27 DIAGNOSIS — C320 Malignant neoplasm of glottis: Secondary | ICD-10-CM

## 2012-10-27 MED ORDER — LEVOFLOXACIN 500 MG PO TABS: 500.0000 mg | ORAL_TABLET | Freq: Every day | ORAL | Status: DC

## 2012-10-27 NOTE — Progress Notes (Signed)
Patient came to nursing after tx, c/o throat pain an 8, 9/30 rad txs completed neck, patient coughed up clots of blood with mucous Monday evening,  Saw Dr.Shumaker on Tuesday about this, he said not to woory, unless gets increasing, yesterday and today patient coughing up clots, stoma is dry, dried blood around  It, daughter-in-law cleans it daily every time she coughs, , also patient cleans area herself,

## 2012-10-27 NOTE — Progress Notes (Signed)
  Radiation Oncology         [phone] ________________________________  Name: [name] MRN: [id number]  Date: 10/27/2012  DOB: August 09, 1946  Radiation Therapy Management  Narrative . . . . . . . . The patient presents complaining of hemoptysis Physical Findings. . .  oral temperature is 97.8 F (36.6 C). Her blood pressure is 146/75 and her pulse is 73. Her respiration is 20 and oxygen saturation is 97%. Lurline Idol site has crusted blood and trachea mucosa appears injected with patches of dried blood and with malodor.  Weight essentially stable.  No significant changes. Impression . . . . . . . The patient is  tolerating radiation.  She may have bronchitis. Plan . . . . . . . . . . . . Continue treatment as planned.  Start Levaquin.  ________________________________  Sheral Apley Tammi Klippel, M.D.

## 2012-10-27 NOTE — Progress Notes (Signed)
Patient has 68mcg fentanyl patch on, takes lortab pills instead of the elixir, via peg tube, had chicken lettuce tomatoes yesterday without difficulty, neck has slight erythema, skin intact, patient daughteri n law Janett Billow gives patient her feedings with pushing the liquid in peg tube, showed daughter-in-law and patient another way of using syringe, with just pouring in syring and letting it go down via gravity, patient got upset and wanted to leave, informed patient and daughter-in law I was sorry was just trying to show another wayto help pataient with this Asked them to wait to see MD on Call,paged Dr.Manning 11:48 AM

## 2012-10-28 ENCOUNTER — Ambulatory Visit
Admission: RE | Admit: 2012-10-28 | Discharge: 2012-10-28 | Disposition: A | Discharge status: Home / Self Care | Payer: Medicaid Other | Source: Ambulatory Visit | Attending: Radiation Oncology | Admitting: Radiation Oncology

## 2012-10-31 ENCOUNTER — Encounter: Payer: Self-pay | Admitting: Radiation Oncology

## 2012-10-31 ENCOUNTER — Ambulatory Visit
Admission: RE | Admit: 2012-10-31 | Discharge: 2012-10-31 | Disposition: A | Discharge status: Home / Self Care | Payer: Medicaid Other | Source: Ambulatory Visit | Attending: Radiation Oncology | Admitting: Radiation Oncology

## 2012-10-31 ENCOUNTER — Ambulatory Visit: Payer: Medicaid Other | Admitting: Physical Therapy

## 2012-10-31 VITALS — BP 142/74 | HR 80 | Temp 98.1°F | Resp 20 | Wt 120.6 lb

## 2012-10-31 DIAGNOSIS — C320 Malignant neoplasm of glottis: Secondary | ICD-10-CM

## 2012-10-31 MED ORDER — HYDROCODONE-ACETAMINOPHEN 7.5-325 MG/15ML PO SOLN: 15.0000 mL | Freq: Four times a day (QID) | ORAL | Status: DC | PRN

## 2012-10-31 MED ORDER — ONDANSETRON HCL 8 MG PO TABS: 8.0000 mg | ORAL_TABLET | Freq: Three times a day (TID) | ORAL | Status: DC | PRN

## 2012-10-31 NOTE — Progress Notes (Signed)
Patient here weekly rad [id number] completed, alert,oriented, patient coughing via trach,dried blood around inside trach, daughter-in-law cleans that area  with water  And hydrogen peroxide.patient mouthing pain a 7, last pain med taken at 7 am today, via peg tube, osmolite 1.2 6 cans daily via peg, using radiaplex gel bid on neck, slight erythema, on Levaquin antibiotic since 10/27/12, needs refill on zofran 4 mg tab 1 q 6h prn nausea, and hydrocortisone 10-325 mg 1 tab via peg q 6 hr pain,patient doews cough up white mucous and then sometimes crusted dried blood 11:57 AM

## 2012-10-31 NOTE — Progress Notes (Signed)
Weekly Management Note:  Site: Neck Current Dose:  2310  cGy Projected Dose: 6300  cGy  Narrative: The patient is seen today for routine under treatment assessment. CBCT/MVCT images/port films were reviewed. The chart was reviewed.   She continues to have dried blood/crusting along her stoma. She was seen by Dr. [name] last week. She does have a humidifier at home. She is running out of her hydrocodone/APAP tablets and will like a refill. She was started on Levaquin  last week for a URI. She continues to have mild nausea for which he takes ondansetron 40 mg Q6 to 8 hours. She is taking in 6 cans of Osmolite a day. Her weight remained stable. She also has left back discomfort which she attributes to coughing try to clear her dried stomal secretions.  Physical Examination:  Filed Vitals:   10/31/12 1157  BP: 142/74  Pulse: 80  Temp: 98.1 F (36.7 C)  Resp: 20  .  Weight: 120 lb 9.6 oz (54.704 kg). There is mild erythema along the neck. The stoma site appears to be clean at this time. No crusting appreciated. There is no adenopathy in the neck. Oral cavity unremarkable to inspection. Back: There is focal discomfort along her left posterior sixth or seventh rib .  Impression: Tolerating radiation therapy well. I suspect her back discomfort is secondary to coughing. If this worsens then we will obtain radiographs. She'll continue with her air humidification. Other prescription for Hycet (hydrocodone/APAP) elixir. Also renewed her Zofran prescription. She'll continue with her Levaquin if through this Thursday.  Plan: Continue radiation therapy as planned.

## 2012-11-01 ENCOUNTER — Ambulatory Visit: Admission: RE | Admit: 2012-11-01 | Payer: Medicaid Other | Source: Ambulatory Visit

## 2012-11-01 ENCOUNTER — Ambulatory Visit
Admission: RE | Admit: 2012-11-01 | Discharge: 2012-11-01 | Disposition: A | Discharge status: Home / Self Care | Payer: Medicaid Other | Source: Ambulatory Visit | Attending: Radiation Oncology | Admitting: Radiation Oncology

## 2012-11-01 DIAGNOSIS — C320 Malignant neoplasm of glottis: Secondary | ICD-10-CM

## 2012-11-01 NOTE — Progress Notes (Signed)
The patient is seen today after her getting off the treatment table in a panic feel that she is unable to breathe. She was premedicated with lorazepam. She continues to have crusting along her stoma, and her daughter-in-law removes dried bloody mucus at least twice a day. She does have a humidified collar at home, but only uses this intermittently. She recently saw Dr. [name] who felt that her stoma was satisfactory in terms of diameter.  Physical examination: She is tearful and anxious. There is some dried mucus along the periphery of her stoma, but no obvious obstruction. Her stoma is narrowed.  Impression: I suspect that because of her narrowed stoma she finds it difficult to move air with even the slightest buildup of dried mucous. I informed her that she should use her humidified collar as often as possible. I placed a call to Dr. [name] to get his advice. I wonder if her stoma can be dilated at this point in time.  Plan: I hope that she can be seen by Dr. [name] for his advice. Radiation therapy is being held today.

## 2012-11-01 NOTE — Progress Notes (Signed)
Arrived feeling as if she could not breath.  Felt there was a crust in her stoma.  Good airflow noted from stoma, but see crusting on edges.  Uable to visualize in crusting when headlight used to see in stoma.  Daughter-in-law states she pulls out blood tinged crust  At least  Twice daily.O2 sat 100%.  Encouraged to use moisture collar all night instead of intermittently as she does presently.  Explained that the increased moisture will help to decrease hardening of secretions as much as is evident now.

## 2012-11-02 ENCOUNTER — Ambulatory Visit: Payer: Medicaid Other | Admitting: Nutrition

## 2012-11-02 ENCOUNTER — Ambulatory Visit
Admission: RE | Admit: 2012-11-02 | Discharge: 2012-11-02 | Disposition: A | Discharge status: Home / Self Care | Payer: Medicaid Other | Source: Ambulatory Visit | Attending: Radiation Oncology | Admitting: Radiation Oncology

## 2012-11-02 NOTE — Progress Notes (Signed)
Patient is a 66 year old female patient of Dr. Alen Blew diagnosed with laryngeal cancer.  Past medical history includes hypertension, anxiety, tobacco, hiatal hernia, acute kidney injury, COPD, and severe malnutrition.  Medications include Lortab and Ativan.  Labs include sodium 132, potassium 5.3, glucose 151, BUN 41 on October 15.  Height: 60 inches. Weight 121.2 pounds November 20. BMI: 23.67.  Patient reports tolerance of tube feedings of Osmolite 1.2, 2 cans 3 times a day with 30 mL of free water before and 120 mL of free water after each bolus feeding. She is eating by mouth and drinking water by mouth. She denies diarrhea or constipation. She does have nausea however it is controlled. She verbalizes desire to gain weight.  Current tube feeding: Osmolite 1.2, 2 cans 3 times a day with 30 mL of free water before and 120 mL free water after each bolus feeding providing 1706 calories, 78 g protein, 1616 mL free water. Current tube feeding providing 100% of patient's estimated nutrition needs.  Diet: Regular as tolerated.  Estimated nutrition needs: 1650-1925 calories, 77-87 g protein, 1.8 L fluid.  Nutrition diagnosis: Swallowing difficulty related to laryngeal cancer as evidenced by patient requiring feeding tube for nutrition support.  Intervention:  Patient denies problems with tube feeding or oral oral intake at this time. Her weight is stable. I educated patient on strategies for her difficulty swallowing. I provided a fact sheet for her to take with her. She is requesting samples of oral nutrition supplements to try by mouth. I provided these for her along with my contact information for questions or concerns.  Monitoring, evaluation, goals: Patient will tolerate tube feedings and oral intake to meet minimum estimated nutritional needs and minimize weight loss throughout treatment. Tube feedings in oral intake will be adjusted as needed to promote weight maintenance.  Next visit:  Patient will like to contact me if she has any questions or concerns or has weight loss during treatment.

## 2012-11-03 ENCOUNTER — Ambulatory Visit
Admission: RE | Admit: 2012-11-03 | Discharge: 2012-11-03 | Disposition: A | Discharge status: Home / Self Care | Payer: Medicaid Other | Source: Ambulatory Visit | Attending: Radiation Oncology | Admitting: Radiation Oncology

## 2012-11-04 ENCOUNTER — Ambulatory Visit
Admission: RE | Admit: 2012-11-04 | Discharge: 2012-11-04 | Disposition: A | Discharge status: Home / Self Care | Payer: Medicaid Other | Source: Ambulatory Visit | Attending: Radiation Oncology | Admitting: Radiation Oncology

## 2012-11-05 ENCOUNTER — Ambulatory Visit
Admission: RE | Admit: 2012-11-05 | Discharge: 2012-11-05 | Disposition: A | Discharge status: Home / Self Care | Payer: Medicaid Other | Source: Ambulatory Visit | Attending: Radiation Oncology | Admitting: Radiation Oncology

## 2012-11-07 ENCOUNTER — Ambulatory Visit
Admission: RE | Admit: 2012-11-07 | Discharge: 2012-11-07 | Disposition: A | Discharge status: Home / Self Care | Payer: Medicaid Other | Source: Ambulatory Visit | Attending: Radiation Oncology | Admitting: Radiation Oncology

## 2012-11-07 ENCOUNTER — Encounter: Payer: Self-pay | Admitting: Radiation Oncology

## 2012-11-07 VITALS — BP 140/61 | HR 80 | Temp 98.6°F | Resp 20 | Wt 122.1 lb

## 2012-11-07 DIAGNOSIS — C320 Malignant neoplasm of glottis: Secondary | ICD-10-CM

## 2012-11-07 MED ORDER — FENTANYL 75 MCG/HR TD PT72: 1.0000 | MEDICATED_PATCH | TRANSDERMAL | Status: DC

## 2012-11-07 NOTE — Progress Notes (Signed)
Patient here weekly rad txs laryngeal cancer, , completed 16/30, slight erythema on neck, patient stoma crusty at throat, states she is usingg moisture collar at night, bringing up mucous  A lot stated daughter in-law, patient says inside her mouth sore and cracks on corner of   lips, gave aquafor  Samples to use prn,osmolite 1.2  via feeding tube 2 cans 3x day with free water before and after, says pain elixir not helping, wants stronger strength, does have 39mcg fentanyl patch on as well. 11:40 AM

## 2012-11-07 NOTE — Progress Notes (Signed)
Weekly Management Note:  Site: Neck Current Dose:  3360  cGy Projected Dose: 6300  cGy  Narrative: The patient is seen today for routine under treatment assessment. CBCT/MVCT images/port films were reviewed. The chart was reviewed.   She complains of pain on swallowing. She is on high set elixir in addition to a 50 mcg fentanyl patch. No major change in her stool crusting. She is using a humidifier. I did speak with Dr. [name] last week. She was given Aquaphor soreness of her lips unrelated radiation therapy.  Physical Examination:  Filed Vitals:   11/07/12 1132  BP: 140/61  Pulse: 80  Temp: 98.6 F (37 C)  Resp: 20  .  Weight: 122 lb 1.6 oz (55.384 kg). The stoma site is unchanged in appearance. There is slight crusting along the periphery. There is no obstruction. There is erythema along the neck with no palpable adenopathy or dry desquamation. Oral cavity is without candidiasis.  Impression: Tolerating radiation therapy well although she does have significant discomfort on swallowing, presumably from radiation therapy. I would a prescription for fentanyl 75 mcg patches. She'll continue with her Hycet elixir as well.  Plan: Continue radiation therapy as planned.

## 2012-11-08 ENCOUNTER — Ambulatory Visit
Admission: RE | Admit: 2012-11-08 | Discharge: 2012-11-08 | Disposition: A | Discharge status: Home / Self Care | Payer: Medicaid Other | Source: Ambulatory Visit | Attending: Radiation Oncology | Admitting: Radiation Oncology

## 2012-11-09 ENCOUNTER — Ambulatory Visit
Admission: RE | Admit: 2012-11-09 | Discharge: 2012-11-09 | Disposition: A | Discharge status: Home / Self Care | Payer: Medicaid Other | Source: Ambulatory Visit | Attending: Radiation Oncology | Admitting: Radiation Oncology

## 2012-11-14 ENCOUNTER — Ambulatory Visit
Admission: RE | Admit: 2012-11-14 | Discharge: 2012-11-14 | Disposition: A | Discharge status: Home / Self Care | Payer: Medicaid Other | Source: Ambulatory Visit | Attending: Radiation Oncology | Admitting: Radiation Oncology

## 2012-11-14 ENCOUNTER — Ambulatory Visit: Payer: Self-pay

## 2012-11-14 ENCOUNTER — Ambulatory Visit: Payer: Medicaid Other | Attending: Radiation Oncology

## 2012-11-14 ENCOUNTER — Encounter: Payer: Self-pay | Admitting: Radiation Oncology

## 2012-11-14 VITALS — BP 146/57 | HR 63 | Temp 98.5°F | Resp 20 | Wt 121.6 lb

## 2012-11-14 DIAGNOSIS — C320 Malignant neoplasm of glottis: Secondary | ICD-10-CM

## 2012-11-14 MED ORDER — FENTANYL 75 MCG/HR TD PT72: 1.0000 | MEDICATED_PATCH | TRANSDERMAL | Status: DC

## 2012-11-14 NOTE — Progress Notes (Signed)
Weekly Management Note:  Site: Neck Current Dose:  3990  cGy Projected Dose: 6300  cGy  Narrative: The patient is seen today for routine under treatment assessment. CBCT/MVCT images/port films were reviewed. The chart was reviewed.   She is without new complaints today. She continues to have dried crusted mucus despite using her humidifier around the clock. She is feeling slightly depressed. Her daughter cancel her PT since she no longer needs it. She choked on sausage last week. She continues with her Duragesic 75 mcg patch which has been satisfactory.  Physical Examination:  Filed Vitals:   11/14/12 1140  BP: 146/57  Pulse: 63  Temp: 98.5 F (36.9 C)  Resp: 20  .  Weight: 121 lb 9.6 oz (55.157 kg). There is erythema along the neck with patchy dry desquamation. Oral cavity is without candidiasis.  Impression: Tolerating radiation therapy well. I offered the patient counseling, but told her to contact her primary care physician or Dr. Humphrey Rolls if she wants to consider an antidepressant. Her fentanyl patch was renewed today since she will be out of it by this weekend.  Plan: Continue radiation therapy as planned.

## 2012-11-14 NOTE — Progress Notes (Signed)
patient here weekly rad [id number] completed, stoma crusted,dry, slight erythema, left side neck has erythema,dime size peeling, Daughter Janett Billow stated Patient using humidifier all the time, and mask rubbed against neck and popped the blister, patient duragesic increased to 75 mcg, states little relief in pain, still coughing up a lot of mucous in morning,s some blood as well, and peg tube  Hurts occasionally sharp apin with movement, tightness, not though when getting feeding s with osmolite cans, daughter stated no physical therapy needed now, patient can raise arms without any pain or problems 11:50 AM

## 2012-11-15 ENCOUNTER — Ambulatory Visit
Admission: RE | Admit: 2012-11-15 | Discharge: 2012-11-15 | Disposition: A | Discharge status: Home / Self Care | Payer: Medicaid Other | Source: Ambulatory Visit | Attending: Radiation Oncology | Admitting: Radiation Oncology

## 2012-11-16 ENCOUNTER — Ambulatory Visit
Admission: RE | Admit: 2012-11-16 | Discharge: 2012-11-16 | Disposition: A | Discharge status: Home / Self Care | Payer: Medicaid Other | Source: Ambulatory Visit | Attending: Radiation Oncology | Admitting: Radiation Oncology

## 2012-11-17 ENCOUNTER — Ambulatory Visit
Admission: RE | Admit: 2012-11-17 | Discharge: 2012-11-17 | Disposition: A | Discharge status: Home / Self Care | Payer: Medicaid Other | Source: Ambulatory Visit | Attending: Radiation Oncology | Admitting: Radiation Oncology

## 2012-11-17 ENCOUNTER — Encounter: Payer: Self-pay | Admitting: Radiation Oncology

## 2012-11-17 DIAGNOSIS — C320 Malignant neoplasm of glottis: Secondary | ICD-10-CM

## 2012-11-17 MED ORDER — BIAFINE EX EMUL
CUTANEOUS | Status: DC | PRN
  Administered 2012-11-17: 12:00:00 via TOPICAL

## 2012-11-17 NOTE — Progress Notes (Signed)
The patient remains uncomfortable despite 75 mcg of Duragesic in addition to Lortab every 4 hours. Her pain is "11 out of 10" appear I will go ahead and write a prescription for fentanyl 100 mcg patches dispense #5 to use when every 3 days.

## 2012-11-17 NOTE — Progress Notes (Signed)
[name] in with her daughter-in-law.  She reports that despite using Fentanyl 75 mcg patch Q 72 hrs and taking her Lortab elixir every 4 hours, her throat pain is not relieved.  She grades her pain as an 11 on a scale of 0-10.  She reports that she can't even drink water without pain and it feels as if she is being "stabbed with a knife" when she swallows.  She has a small moist area on the left neck region.   Diffuse erythema noted in the treatment field with dried blood- like crusting around the stoma but it remains patent.  Given another tube of Biafine for skin care and given Telfa, non-stick pads to place on the sides of her neck whenever she applies her moisture collar to prevent friction.

## 2012-11-18 ENCOUNTER — Ambulatory Visit
Admission: RE | Admit: 2012-11-18 | Discharge: 2012-11-18 | Disposition: A | Discharge status: Home / Self Care | Payer: Medicaid Other | Source: Ambulatory Visit | Attending: Radiation Oncology | Admitting: Radiation Oncology

## 2012-11-21 ENCOUNTER — Ambulatory Visit
Admission: RE | Admit: 2012-11-21 | Discharge: 2012-11-21 | Disposition: A | Discharge status: Home / Self Care | Payer: Medicaid Other | Source: Ambulatory Visit | Attending: Radiation Oncology | Admitting: Radiation Oncology

## 2012-11-21 VITALS — BP 144/70 | HR 71 | Temp 98.1°F | Resp 20 | Wt 122.7 lb

## 2012-11-21 DIAGNOSIS — C320 Malignant neoplasm of glottis: Secondary | ICD-10-CM

## 2012-11-21 MED ORDER — FLUCONAZOLE 100 MG PO TABS: 100.0000 mg | ORAL_TABLET | Freq: Every day | ORAL | Status: DC

## 2012-11-21 NOTE — Progress Notes (Signed)
Weekly Management Note:  Site: Neck Current Dose:  5040  cGy Projected Dose: 6300  cGy  Narrative: The patient is seen today for routine under treatment assessment. CBCT/MVCT images/port films were reviewed. The chart was reviewed.   She states that her throat pain is quite severe, and she wants to stop radiation therapy this Friday. She is on 100 mcg Duragesic patch in addition to Lortab elixir. Her weight remained stable.  Physical Examination:  Filed Vitals:   11/21/12 1144  BP: 144/70  Pulse: 71  Temp: 98.1 F (36.7 C)  Resp: 20  .  Weight: 122 lb 11.2 oz (55.656 kg). There is marked erythema the skin along the neck bilaterally with continued crusting along her stoma. No palpable adenopathy in the neck. On inspection oral cavity there is extensive be candidiasis.  Impression: Tolerating radiation therapy reasonably well except for pain control. I'm not sure if this is all related to her radiation mucositis or to some degree from candidiasis. I explained to her how we decide on a dose prescription, and I cannot predict whether or not the last 2 treatments are of significant clinical benefit. She tells me Friday will be her last treatment. She is candidiasis, and she believes she can swallow a pill a day, therefore I'll start her on Diflucan 100 mg each day. We also talked about repopulate her oral cavity with lactobacillus.  Plan: Continue radiation therapy as planned. She plans on dropping her last 2 treatments, and I'll see her again this Thursday, then have her return for a followup visit in 2-3 weeks.

## 2012-11-21 NOTE — Progress Notes (Signed)
Patient here wekl;y rad txs:  Neck,24/30 completed, moderate erythema on neck and throat,stoma with  Some dried crusted blood on skin, patient taking sips of water "Hurts to swallow", pain a 18 on 1 1-10 scale patient stated, was increased on fentanyl patch to 100 mcg last Friday, patient states 'Not helping any at all with her pain," she is taking the lortab elixir via peg tube every 4 hours as we, also has ulcers in her mouth, she brushes her gums,tongue daily,  , patient wants to finish treatment this Friday, doesn't;'t want to come back after Friday, informed her and daughter in-law Janett Billow they would need to discuss with Dr.Murray, 11:55 AM

## 2012-11-22 ENCOUNTER — Ambulatory Visit
Admission: RE | Admit: 2012-11-22 | Discharge: 2012-11-22 | Disposition: A | Discharge status: Home / Self Care | Payer: Medicaid Other | Source: Ambulatory Visit | Attending: Radiation Oncology | Admitting: Radiation Oncology

## 2012-11-23 ENCOUNTER — Ambulatory Visit
Admission: RE | Admit: 2012-11-23 | Discharge: 2012-11-23 | Disposition: A | Discharge status: Home / Self Care | Payer: Medicaid Other | Source: Ambulatory Visit | Attending: Radiation Oncology | Admitting: Radiation Oncology

## 2012-11-24 ENCOUNTER — Encounter: Payer: Self-pay | Admitting: Radiation Oncology

## 2012-11-24 ENCOUNTER — Ambulatory Visit
Admission: RE | Admit: 2012-11-24 | Discharge: 2012-11-24 | Disposition: A | Discharge status: Home / Self Care | Payer: Medicaid Other | Source: Ambulatory Visit | Attending: Radiation Oncology | Admitting: Radiation Oncology

## 2012-11-24 VITALS — BP 166/63 | HR 78 | Temp 98.7°F | Resp 20 | Wt 122.6 lb

## 2012-11-24 DIAGNOSIS — Z8542 Personal history of malignant neoplasm of other parts of uterus: Secondary | ICD-10-CM | POA: Insufficient documentation

## 2012-11-24 DIAGNOSIS — C320 Malignant neoplasm of glottis: Secondary | ICD-10-CM

## 2012-11-24 MED ORDER — HYDROCODONE-ACETAMINOPHEN 7.5-325 MG/15ML PO SOLN: 15.0000 mL | Freq: Four times a day (QID) | ORAL | Status: DC | PRN

## 2012-11-24 MED ORDER — FENTANYL 100 MCG/HR TD PT72: 1.0000 | MEDICATED_PATCH | TRANSDERMAL | Status: DC

## 2012-11-24 NOTE — Progress Notes (Signed)
Weekly Management Note:  Site: Neck Current Dose:  5670  cGy Projected Dose: 6300  cGy  Narrative: The patient is seen today for routine under treatment assessment. CBCT/MVCT images/port films were reviewed. The chart was reviewed.   The patient is seen today prior to completion of an abbreviated course of radiation therapy tomorrow. She is without new complaints today. She states that her mouth and throat is slightly improved after starting treatment for candidiasis. She requests a refill of her hydrocodone/AP AP elixir and also an additional proximal of Duragesic 100 mcg patches (5) since she will not be coming in later next week. We will finish her treatment tomorrow.  Physical Examination:  Filed Vitals:   11/24/12 1151  BP: 166/63  Pulse: 78  Temp: 98.7 F (37.1 C)  Resp: 20  .  Weight: 122 lb 9.6 oz (55.611 kg). There is marked erythema and patchy dry desquamation with crusting along her neck. No areas of moist desquamation. The stomal crusting is unchanged. On inspection oral cavity there is less candidiasis.  Impression: Tolerating radiation therapy well. I renewed her Duragesic 100 mcg patches, dispensed #5, and also Hycet elixir with 1 refill.  Plan: Continue radiation therapy as planned. She'll finish tomorrow and return to see me the week of December 30 for a followup visit.

## 2012-11-24 NOTE — Progress Notes (Signed)
Patient there weekly rad txs 27/30 rad txs completed , 1 more tx, neck very erythemous and scabbing/crusting , stoma with crusted blood, pain an 8 on 1-10 pain scale,  100 mcg fentanyl patch is helping some states patient, she is excited tomorrow last day, using biafine cream, needs lortab elixir refill will give f/u appt card ,will aske mD f/u when 11:58 AM

## 2012-11-25 ENCOUNTER — Ambulatory Visit
Admission: RE | Admit: 2012-11-25 | Discharge: 2012-11-25 | Disposition: A | Discharge status: Home / Self Care | Payer: Medicaid Other | Source: Ambulatory Visit | Attending: Radiation Oncology | Admitting: Radiation Oncology

## 2012-11-25 DIAGNOSIS — C320 Malignant neoplasm of glottis: Secondary | ICD-10-CM

## 2012-11-25 MED ORDER — BIAFINE EX EMUL
Freq: Every day | CUTANEOUS | Status: DC
  Administered 2012-11-25: 14:00:00 via TOPICAL

## 2012-11-26 ENCOUNTER — Encounter: Payer: Self-pay | Admitting: Radiation Oncology

## 2012-11-26 NOTE — Progress Notes (Signed)
[facility] End of Treatment Note  Name:[name]  Date: 11/26/2012 [id number] DOB:03-11-1946   Status:outpatient    CC: [name], MD  , Dr. Jerrell Belfast, Dr. Marcy Panning  REFERRING PHYSICIAN:   Dr. 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 Intermediate risk lymph node tumor bed 5600 cGy in 20 sessions of a prescribed 6000 cGy in 30 sessions. Moderate risk lymph node tumor bed 5040 cGy in 20 sessions of a prescribed 5400 cGy 30 sessions.                         BEAMS/ENERGY:  6 MV photons helical IMRT Tomotherapy                 NARRATIVE: Despite not having received chemotherapy, the patient had difficult time during her course of treatment. She is significant dysphagia requiring a 100 mcg patch of fentanyl along with hydrocodone/APAP elixir for breakthrough pain. She also developed candidiasis during her last week of therapy. She wants to discontinue her therapy on multiple occasions, and we finally received compromise a drop in the last 2 treatments at her insistence. With her PEG feeding tube, she was able to gain 3 pounds during her course of therapy. As expected, she developed moderate to severe radiation dermatitis with extensive dry desquamation by completion of therapy.  Early on during her course of therapy she also had the subjective sensation of difficulty moving air through her stoma with hemorrhagic crusting. This improved after use of a humidified collar.                        PLAN: Routine followup in 3 weeks. Patient instructed to call if questions or worsening complaints in interim.

## 2012-11-28 ENCOUNTER — Ambulatory Visit: Payer: Medicaid Other

## 2012-11-29 ENCOUNTER — Ambulatory Visit: Payer: Medicaid Other

## 2012-11-30 ENCOUNTER — Ambulatory Visit: Payer: Medicaid Other

## 2012-12-08 ENCOUNTER — Encounter: Payer: Self-pay | Admitting: Radiation Oncology

## 2012-12-09 ENCOUNTER — Telehealth: Payer: Self-pay | Admitting: *Deleted

## 2012-12-09 NOTE — Telephone Encounter (Signed)
Patient daughter had called requesting refill on lortab eilix, ok per Dr.moody 1 refill, [phone], spoke with pharmacist Phil,  Refill patients lortab 7.5-500mh/15ml, take 10-15 ml by mouth every 4 hours as needed pain, no refills, called patient daughter-in-law Stephani Police, informed she can pick up later today, 3:31 PM

## 2012-12-12 ENCOUNTER — Telehealth: Payer: Self-pay

## 2012-12-12 NOTE — Telephone Encounter (Signed)
Family member called to inquire about patient having nausea/vomiting and diarrhea.Patient was given pepto bismol afterwhich she started gagging and coughed up some bright blood.Coughing has sub-sided.Informed it is not unusual to have some productive blood after head/neck treatment.To try imodium ad instead of pepto-bismol.Push extra fluids through tube.Diarrhea could be coming from osmolite.To call back in morning by 9:00 am if symptoms continue otherwise patient scheduled for follow up 12/20/2012.

## 2012-12-15 ENCOUNTER — Ambulatory Visit: Payer: Medicaid Other | Admitting: Radiation Oncology

## 2012-12-20 ENCOUNTER — Ambulatory Visit
Admission: RE | Admit: 2012-12-20 | Discharge: 2012-12-20 | Disposition: A | Discharge status: Home / Self Care | Payer: Medicaid Other | Source: Ambulatory Visit | Attending: Radiation Oncology | Admitting: Radiation Oncology

## 2012-12-20 ENCOUNTER — Encounter: Payer: Self-pay | Admitting: Radiation Oncology

## 2012-12-20 VITALS — BP 162/66 | HR 73 | Temp 99.2°F | Resp 20 | Wt 125.0 lb

## 2012-12-20 DIAGNOSIS — C320 Malignant neoplasm of glottis: Secondary | ICD-10-CM

## 2012-12-20 HISTORY — DX: Personal history of irradiation: Z92.3

## 2012-12-20 MED ORDER — HYDROCODONE-ACETAMINOPHEN 7.5-500 MG/15ML PO SOLN: 15.0000 mL | Freq: Four times a day (QID) | ORAL | Status: DC | PRN

## 2012-12-20 MED ORDER — FENTANYL 25 MCG/HR TD PT72: 1.0000 | MEDICATED_PATCH | TRANSDERMAL | Status: DC

## 2012-12-20 NOTE — Progress Notes (Signed)
Patient here f/u rad tx  S/p supraglottic larynx,10/17/12-11/25/12. Alert,ooriented x3, nuck slight erythema,skin intact, trach clean, no crusting or bleeding, patient doing much better, but looks like thrush on tongue, takes 6 cans osmolite 1.5 6x day via peg tube, , 60 ml free water before and 120 ml free water after feedings, 96% room air sats, denys pain, patient used Vaseline around trach and neck when it was crusted , patient requesting refill on lortab elixir, and is out of 100 mcg fentanyl patch, daughter in law Janett Billow put a 48mcg patch on patient, discussed may wean patient off fentanyl patch will discuss with Dr.Murray 3:22 PM

## 2012-12-20 NOTE — Progress Notes (Signed)
CC: Dr. Jerrell Belfast, Dr. Jorja Loa, Dr. Billey Chang, Dr. Marcy Panning  Followup note:[name] today approximately 6 weeks following completion of an abbreviated course of radiation therapy in the management of [name] stage IV (T3 N2 B. M0) squamous cell carcinoma of the supraglottic larynx. [name] is to do well and has been able to decrease [name] Duragesic patch from 100 mcg to 50 mcg. However, [name] still uses hydrocodone/APAP elixir 4 times a day. [name] does have mild to moderate dysphagia but is able to swallow pured foods. [name] maintains [name] nutrition through [name] PEG tube. [name] still reports thickened mucus along [name] stoma, but I believe that this is improved. [name] weight is up 3 pounds over the past month. [name] tells me [name] will see Dr. [name] next week.  Physical examination: [name] looks great. Wt Readings from Last 3 Encounters:  12/20/12 125 lb (56.7 kg)  11/24/12 122 lb 9.6 oz (55.611 kg)  11/21/12 122 lb 11.2 oz (55.656 kg)   Temp Readings from Last 3 Encounters:  12/20/12 99.2 F (37.3 C) Oral  11/24/12 98.7 F (37.1 C) Oral  11/21/12 98.1 F (36.7 C) Oral   BP Readings from Last 3 Encounters:  12/20/12 162/66  11/24/12 166/63  11/21/12 144/70   Pulse Readings from Last 3 Encounters:  12/20/12 73  11/24/12 78  11/21/12 71   Head and neck examination: [name] skin along the neck has completely reepitheilialized. [name] stoma site is clean. There is no palpable lymphadenopathy in the neck. Oral cavity and oropharynx are unremarkable to inspection. [name] does have moderate trismus.  Impression: Satisfactory progress, but [name] does have worsening trismus. I told [name] that I would like for [name] to see Dr. Enrique Sack for a followup visit, and have him advise on trismus exercises. Ideally we can wait on evaluation of [name] dysphagia for least a few more months. [name] may have some degree of esophageal narrowing. I would like to obtain a PET scan approximately 3 months following completion of  radiation therapy which would be in another 2 months.  Plan: Followup with Dr. [name] next week. I'll see [name] back for a followup visit in 6 weeks. At that time I will get [name] set up for a PET scan. In the meantime, I hope [name] can  see Dr. Enrique Sack and work on [name] trismus. I wrote prescriptions for Duragesic 25 mcg patches, [name] can taper this after [name] runs out of [name] 50 mcg patches. I did renew [name] hydrocodone/APAP elixir with 1 refill.

## 2012-12-21 ENCOUNTER — Telehealth: Payer: Self-pay | Admitting: Oncology

## 2012-12-21 ENCOUNTER — Telehealth: Payer: Self-pay | Admitting: *Deleted

## 2012-12-21 NOTE — Telephone Encounter (Signed)
S/w relative re appt for 2/27.

## 2012-12-21 NOTE — Telephone Encounter (Signed)
Message copied by GARNER, Aletha Halim on Wed Dec 21, 2012  1:50 PM ------      Message from: [name]      Created: Wed Dec 21, 2012  5:28 AM      Regarding: follow up needed       Stanton Kidney            This patient needs to see me in follow up in 1-2 months with labs            Thank you            KK

## 2012-12-21 NOTE — Telephone Encounter (Signed)
Per MD, pt to f/u in 1-2 months with MD. [name] notified pt to be expecting a call from scheduling. [place]

## 2012-12-31 ENCOUNTER — Other Ambulatory Visit: Payer: Self-pay | Admitting: Radiation Oncology

## 2013-01-17 ENCOUNTER — Telehealth: Payer: Self-pay | Admitting: *Deleted

## 2013-01-17 NOTE — Telephone Encounter (Signed)
Returned call from [name] daughter, [name].  She reports that [name] is experiencing "achy pain in her legs and arms with shaky hands."  She denies any fever nor headache.  She continues to have intermittent nausea which has been prevalent since her radiation therapy treatment for her Squamous Cell Carcinoma of her supraglottic larynx.  Her daughter also reports she is eating and drinking without any difficulty.  Dr. Valere Dross informed and [name] advised to make an appointment with [name] primary Care physician, Billey Chang.  [name] stated she would call ASAP and notify Dr. Valere Dross of the outcome of this visit.

## 2013-01-31 ENCOUNTER — Encounter: Payer: Self-pay | Admitting: Radiation Oncology

## 2013-01-31 ENCOUNTER — Ambulatory Visit
Admission: RE | Admit: 2013-01-31 | Discharge: 2013-01-31 | Disposition: A | Discharge status: Home / Self Care | Payer: Medicare Other | Source: Ambulatory Visit | Attending: Radiation Oncology | Admitting: Radiation Oncology

## 2013-01-31 VITALS — BP 166/70 | HR 77 | Temp 97.8°F | Resp 20 | Wt 126.2 lb

## 2013-01-31 DIAGNOSIS — C320 Malignant neoplasm of glottis: Secondary | ICD-10-CM

## 2013-01-31 NOTE — Progress Notes (Signed)
CC: Dr. Jerrell Belfast, Dr. Billey Chang  Followup note:  [name] visits today approximately 2 months following completion of postoperative radiation therapy in the management of her stage IV (T3 N2 B. M0) squamous cell carcinoma of the supraglottic larynx. She is without new complaints today. She continues to have dysphagia and maintains her nutrition through her PEG tube. She is off her Duragesic patch and currently takes up to 15 cc of hydrocodone/APAP elixir a day. Her remain stable. She sees a Astronomer through [facility] and has electro-larynx device. Her major problem continues to be mucous production which is difficult to clear. She sees Dr. [name] later this month or early March.  Physical examination: She looks well. Wt Readings from Last 3 Encounters:  01/31/13 126 lb 3.2 oz (57.244 kg)  12/20/12 125 lb (56.7 kg)  11/24/12 122 lb 9.6 oz (55.611 kg)   Temp Readings from Last 3 Encounters:  01/31/13 97.8 F (36.6 C) Oral  12/20/12 99.2 F (37.3 C) Oral  11/24/12 98.7 F (37.1 C) Oral   BP Readings from Last 3 Encounters:  01/31/13 166/70  12/20/12 162/66  11/24/12 166/63   Pulse Readings from Last 3 Encounters:  01/31/13 77  12/20/12 73  11/24/12 78   Head and neck examination: There is no palpable lymphadenopathy in the neck. The stoma site is clean although there is a small amount of dried mucous along the periphery. The stomal diameter is small but apparently adequate. Oral cavity and oropharynx are unremarkable to inspection. PEG tube site is clean.  Impression: Satisfactory progress. I would like to obtain a PET scan in early April and then see her for a followup in mid-April. Dr. [name] will need to address her dysphagia, perhaps obtain a barium swallow. It is possible that she may require dilatation of her cervical esophagus. Other than having her continue her expectorant and increase her fluid intake I've no other advice regarding her mucous production  and fear of strangulation.  Plan: I will go ahead and get her set up for a PET scan in early April and then a followup visit with me. She'll maintain her followup with Dr. [name]. She'll continue with speech therapy through [facility].

## 2013-01-31 NOTE — Progress Notes (Signed)
Follow up[ s/p radiation txs 10/17/2012-12/13-13 squamous cell ca supraglottic larynx Alert,oriented x3, duaghter in law speaking for patient, patient denys pain, , using feeding tube 6 cans1.2  osmolite day, peg tube states"It hurts, when can it come out", eating some foods, still gets choked, soup with noodles choked and made herself threw up', neck well healed, still tanning, coughs up muscous in her , trach, per son 24 /7 total care of patient, wants to see Ernestene Kiel again, wants to know when she can come off mucinex on for 3 months 4:19 PM , oxygen level= 99% room air, feels she cannot breathe well =lots of mucous

## 2013-02-01 ENCOUNTER — Telehealth: Payer: Self-pay | Admitting: *Deleted

## 2013-02-01 NOTE — Telephone Encounter (Signed)
CALLED PATIENT TO INFORM OF TEST AND FU VISIT IN April, SPOKE WITH PATIENT'S DAUGHTER IN-LAW, JESSICA AND SHE IS AWARE OF THIS TEST AND FU VISIT

## 2013-02-07 ENCOUNTER — Other Ambulatory Visit: Payer: Self-pay | Admitting: Radiation Oncology

## 2013-02-07 DIAGNOSIS — G4700 Insomnia, unspecified: Secondary | ICD-10-CM | POA: Insufficient documentation

## 2013-02-08 ENCOUNTER — Telehealth: Payer: Self-pay | Admitting: *Deleted

## 2013-02-08 ENCOUNTER — Other Ambulatory Visit: Payer: Self-pay | Admitting: Medical Oncology

## 2013-02-08 ENCOUNTER — Other Ambulatory Visit: Payer: Self-pay | Admitting: Radiation Oncology

## 2013-02-08 DIAGNOSIS — C320 Malignant neoplasm of glottis: Secondary | ICD-10-CM

## 2013-02-08 MED ORDER — HYDROCODONE-ACETAMINOPHEN 7.5-325 MG/15ML PO SOLN: 15.0000 mL | Freq: Four times a day (QID) | ORAL | Status: DC | PRN

## 2013-02-08 NOTE — Telephone Encounter (Signed)
Tried to call patient home number to let them know they need to come to nursing to pick up rx,no answer,will try later

## 2013-02-08 NOTE — Telephone Encounter (Signed)
Called [facility] [phone],spoke with pharmacist Rush Landmark, Rx Hydrocodone-acetaminophen  (Hycet) 7.5-325 mg/75ml soln, take 95ml 4x day as needed/pain, no refills dispense 473 ml,will call patient later and inform them they can pick up at their pharmacy,called patient's home [phone] no answer, no voice message to leave 8:18 AM

## 2013-02-08 NOTE — Telephone Encounter (Signed)
Called left vm on jessica's (daughter-in-law)cell phone that refill rx hycet can be picked up today at [facility],can call for any qquestions

## 2013-02-09 ENCOUNTER — Other Ambulatory Visit: Payer: Self-pay | Admitting: Lab

## 2013-02-09 ENCOUNTER — Ambulatory Visit: Payer: Self-pay | Admitting: Oncology

## 2013-03-01 ENCOUNTER — Other Ambulatory Visit: Payer: Self-pay | Admitting: Radiation Oncology

## 2013-03-02 ENCOUNTER — Telehealth: Payer: Self-pay | Admitting: Oncology

## 2013-03-02 ENCOUNTER — Encounter: Payer: Self-pay | Admitting: Oncology

## 2013-03-02 NOTE — Telephone Encounter (Signed)
Called in a refill for hydrocodone-acetaminophen (Hycet) 7.5-325 mg/15 mL solution to [facility] on [address]. per Dr. Sondra Come.  Script for 15 mLs by mouth every six hours as needed.  473 mL dispensed. No refills.  Left for voice mail for Frankey Poot, her son, that the refill has been called in.

## 2013-03-04 ENCOUNTER — Other Ambulatory Visit: Payer: Self-pay | Admitting: Radiation Oncology

## 2013-03-04 MED ORDER — HYDROCODONE-ACETAMINOPHEN 7.5-325 MG/15ML PO SOLN: 15.0000 mL | Freq: Four times a day (QID) | ORAL | Status: DC | PRN

## 2013-03-06 ENCOUNTER — Telehealth: Payer: Self-pay | Admitting: Oncology

## 2013-03-06 NOTE — Telephone Encounter (Signed)
Called Haylyn Zoboynik's son Frankey Poot to make sure he was able to pick up the hydrocodone-acetaminophen 7.5-325 mg/44ml (15 ml by mouth every 6 hours as needed for pain, 480ml)  for his mother.  [facility] was [facility], March 22.  Frankey Poot said that he picked it up on Friday.

## 2013-03-07 ENCOUNTER — Telehealth: Payer: Self-pay | Admitting: *Deleted

## 2013-03-07 NOTE — Telephone Encounter (Signed)
Called patient 's son - Frankey Poot to inform that scan has been moved to 12:30 pm at [facility], spoke with Frankey Poot and he is aware of this change

## 2013-03-20 ENCOUNTER — Encounter (HOSPITAL_COMMUNITY)
Admission: RE | Admit: 2013-03-20 | Discharge: 2013-03-20 | Disposition: A | Discharge status: Home / Self Care | Payer: Medicare Other | Source: Ambulatory Visit | Attending: Radiation Oncology | Admitting: Radiation Oncology

## 2013-03-20 ENCOUNTER — Encounter (HOSPITAL_COMMUNITY): Payer: Medicare Other

## 2013-03-20 DIAGNOSIS — C320 Malignant neoplasm of glottis: Secondary | ICD-10-CM | POA: Insufficient documentation

## 2013-03-20 MED ORDER — FLUDEOXYGLUCOSE F - 18 (FDG) INJECTION
20.1000 | Freq: Once | INTRAVENOUS | Status: AC | PRN
  Administered 2013-03-20: 20.1 via INTRAVENOUS

## 2013-03-22 ENCOUNTER — Telehealth: Payer: Self-pay

## 2013-03-22 NOTE — Telephone Encounter (Signed)
Patient's son Frankey Poot called for PET results performed on Monday 03/20/13.Informed I would print  results  and give to Dr.Murray to review and either Dr.Murray or one of the nurses would call with results on tomorrow.

## 2013-03-29 ENCOUNTER — Ambulatory Visit: Admission: RE | Admit: 2013-03-29 | Payer: Medicare Other | Source: Ambulatory Visit | Admitting: Radiation Oncology

## 2013-04-01 ENCOUNTER — Encounter: Payer: Self-pay | Admitting: Radiation Oncology

## 2013-04-01 ENCOUNTER — Other Ambulatory Visit: Payer: Self-pay | Admitting: Radiation Oncology

## 2013-04-01 MED ORDER — HYDROCODONE-ACETAMINOPHEN 7.5-325 MG/15ML PO SOLN: ORAL | Status: DC

## 2013-04-01 NOTE — Progress Notes (Signed)
   Department of Radiation Oncology  Phone:  [phone] Fax:        [phone]  Progress note  Yesterday evening the patient's son called requesting a refill on the patient's hydrocodone/acetaminophen solution.  The patient has run out of this medication this week. Of note is the patient missed a followup appointment with Dr. Arloa Koh earlier this week.  The son was unable to bring the patient since he was diagnosed with a kidney stone.  According to the son the patient continues to have pain in the neck region as well as the peg site. A [facility] [address], 473 mL with no refills.  I have asked the son to reschedule the patient's followup appointment with Dr. Valere Dross on Monday.  -----------------------------------  Blair Promise, PhD, MD

## 2013-04-03 ENCOUNTER — Telehealth: Payer: Self-pay | Admitting: Oncology

## 2013-04-03 ENCOUNTER — Encounter: Payer: Self-pay | Admitting: Oncology

## 2013-04-03 NOTE — Telephone Encounter (Signed)
Gwena, Mulrooney Lamb's daughter in law, called and said that Ms. Civello has had ear and throat pain and a runny nose for the last few days.  She has had a hard time swallowing.  Also, she said that Ms. Hegel may be running a fever because she felt warm.  Notified Dr. Valere Dross who recommended that she see her primary care physician.  Called Janett Billow back and let her know to make an appointment with her primary care physician today.

## 2013-04-18 ENCOUNTER — Ambulatory Visit
Admission: RE | Admit: 2013-04-18 | Discharge: 2013-04-18 | Disposition: A | Discharge status: Home / Self Care | Payer: Medicare Other | Source: Ambulatory Visit | Attending: Radiation Oncology | Admitting: Radiation Oncology

## 2013-04-18 VITALS — BP 144/82 | HR 66 | Temp 98.8°F | Wt 114.2 lb

## 2013-04-18 DIAGNOSIS — C320 Malignant neoplasm of glottis: Secondary | ICD-10-CM

## 2013-04-18 NOTE — Progress Notes (Signed)
Patient here for routine follow up completion of supraglottic radiation.Patient has some discomfort of right neck.Completed a 10 day regimen of antibiotic therapy(Augmentin 500 mg bid) on 04/15/13 per Dr.Shoemaker..Eating well and has had a 2 lb increase in weight.Requestin to have feeding tube removed, only uses to flush daily.

## 2013-04-18 NOTE — Progress Notes (Signed)
CC Dr. Jerrell Belfast, Dr. Billey Chang  Followup note:  She seen today with an interpreter.  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 She is doing quite well although she does report some discomfort along her oral tongue. This is worse with spicy foods. She recently completed a course of antibiotics to Dr. [name] for right neck discomfort. This is improved. She is still been taking Hycet when necessary. Her PET scan from April was without definite evidence for persistent disease. She claims that her weight is stable over the past 4 weeks without using her PEG tube. She would like to have her PEG tube removed. She was having speech therapy in [facility] and has tried an electro-larynx, but she did not feel that it was all helpful. She sees Dr. [name] again for a followup visit on May 13.  Physical examination: She looks great. Wt Readings from Last 3 Encounters:  04/18/13 114 lb 3.2 oz (51.801 kg)  01/31/13 126 lb 3.2 oz (57.244 kg)  12/20/12 125 lb (56.7 kg)   Temp Readings from Last 3 Encounters:  04/18/13 98.8 F (37.1 C)   01/31/13 97.8 F (36.6 C) Oral  12/20/12 99.2 F (37.3 C) Oral   BP Readings from Last 3 Encounters:  04/18/13 144/82  01/31/13 166/70  12/20/12 162/66   Pulse Readings from Last 3 Encounters:  04/18/13 66  01/31/13 77  12/20/12 73   There is no palpable lymphadenopathy in the neck. The stoma site is clean. On inspection oral cavity she does have moderate trismus. There is no obvious candidiasis. She has dental implants along the mandible. On inspection of the oral tongue there is slight erythema along the anterior tip of the tongue which may represent irritation from her dentures.  Impression: No physical evidence for recurrent disease. I'm pleased with her PET scan findings. She requests a refill for her Hycet,  but I do not think that her discomfort is all related to her underlying malignancy or therapy. I told her that Dr. [name] to evaluate her and determine whether not she would need any pain medication. I am concerned about the potential for addiction at this point in time.  Plan: She claims that her weight is stable over the past 4 weeks without using her PEG tube, therefore we'll go ahead and get her PEG tube removed. She'll see Dr. [name] next week, and I'll see her for a followup visit in 3 months.

## 2013-04-21 ENCOUNTER — Ambulatory Visit (HOSPITAL_COMMUNITY): Admission: RE | Admit: 2013-04-21 | Payer: Medicare Other | Source: Ambulatory Visit

## 2013-04-21 ENCOUNTER — Ambulatory Visit (HOSPITAL_COMMUNITY)
Admission: RE | Admit: 2013-04-21 | Discharge: 2013-04-21 | Disposition: A | Discharge status: Home / Self Care | Payer: Medicare Other | Source: Ambulatory Visit | Attending: Radiation Oncology | Admitting: Radiation Oncology

## 2013-04-21 DIAGNOSIS — Z431 Encounter for attention to gastrostomy: Secondary | ICD-10-CM | POA: Insufficient documentation

## 2013-04-21 DIAGNOSIS — C320 Malignant neoplasm of glottis: Secondary | ICD-10-CM

## 2013-04-21 DIAGNOSIS — C329 Malignant neoplasm of larynx, unspecified: Secondary | ICD-10-CM | POA: Insufficient documentation

## 2013-04-21 MED ORDER — LIDOCAINE VISCOUS 2 % MT SOLN
20.0000 mL | Freq: Once | OROMUCOSAL | Status: AC
  Administered 2013-04-21: 20 mL via OROMUCOSAL
  Filled 2013-04-21: qty 20

## 2013-04-28 DIAGNOSIS — N183 Chronic kidney disease, stage 3 unspecified: Secondary | ICD-10-CM | POA: Insufficient documentation

## 2013-06-27 ENCOUNTER — Emergency Department (HOSPITAL_COMMUNITY)
Admission: EM | Admit: 2013-06-27 | Discharge: 2013-06-27 | Disposition: A | Discharge status: Home / Self Care | Payer: Medicare Other | Attending: Emergency Medicine | Admitting: Emergency Medicine

## 2013-06-27 ENCOUNTER — Emergency Department (HOSPITAL_COMMUNITY): Payer: Medicare Other

## 2013-06-27 DIAGNOSIS — S93409A Sprain of unspecified ligament of unspecified ankle, initial encounter: Secondary | ICD-10-CM | POA: Insufficient documentation

## 2013-06-27 DIAGNOSIS — S8000XA Contusion of unspecified knee, initial encounter: Secondary | ICD-10-CM | POA: Insufficient documentation

## 2013-06-27 DIAGNOSIS — J4489 Other specified chronic obstructive pulmonary disease: Secondary | ICD-10-CM | POA: Insufficient documentation

## 2013-06-27 DIAGNOSIS — G4700 Insomnia, unspecified: Secondary | ICD-10-CM | POA: Insufficient documentation

## 2013-06-27 DIAGNOSIS — F411 Generalized anxiety disorder: Secondary | ICD-10-CM | POA: Insufficient documentation

## 2013-06-27 DIAGNOSIS — S8001XA Contusion of right knee, initial encounter: Secondary | ICD-10-CM

## 2013-06-27 DIAGNOSIS — Y929 Unspecified place or not applicable: Secondary | ICD-10-CM | POA: Insufficient documentation

## 2013-06-27 DIAGNOSIS — E7800 Pure hypercholesterolemia, unspecified: Secondary | ICD-10-CM | POA: Insufficient documentation

## 2013-06-27 DIAGNOSIS — Y939 Activity, unspecified: Secondary | ICD-10-CM | POA: Insufficient documentation

## 2013-06-27 DIAGNOSIS — Z79899 Other long term (current) drug therapy: Secondary | ICD-10-CM | POA: Insufficient documentation

## 2013-06-27 DIAGNOSIS — I10 Essential (primary) hypertension: Secondary | ICD-10-CM | POA: Insufficient documentation

## 2013-06-27 DIAGNOSIS — W010XXA Fall on same level from slipping, tripping and stumbling without subsequent striking against object, initial encounter: Secondary | ICD-10-CM | POA: Insufficient documentation

## 2013-06-27 DIAGNOSIS — Z8719 Personal history of other diseases of the digestive system: Secondary | ICD-10-CM | POA: Insufficient documentation

## 2013-06-27 DIAGNOSIS — Z87891 Personal history of nicotine dependence: Secondary | ICD-10-CM | POA: Insufficient documentation

## 2013-06-27 DIAGNOSIS — Z931 Gastrostomy status: Secondary | ICD-10-CM | POA: Insufficient documentation

## 2013-06-27 DIAGNOSIS — J449 Chronic obstructive pulmonary disease, unspecified: Secondary | ICD-10-CM | POA: Insufficient documentation

## 2013-06-27 DIAGNOSIS — S93601A Unspecified sprain of right foot, initial encounter: Secondary | ICD-10-CM

## 2013-06-27 DIAGNOSIS — K219 Gastro-esophageal reflux disease without esophagitis: Secondary | ICD-10-CM | POA: Insufficient documentation

## 2013-06-27 DIAGNOSIS — Z8781 Personal history of (healed) traumatic fracture: Secondary | ICD-10-CM | POA: Insufficient documentation

## 2013-06-27 DIAGNOSIS — Z8589 Personal history of malignant neoplasm of other organs and systems: Secondary | ICD-10-CM | POA: Insufficient documentation

## 2013-06-27 DIAGNOSIS — Z8673 Personal history of transient ischemic attack (TIA), and cerebral infarction without residual deficits: Secondary | ICD-10-CM | POA: Insufficient documentation

## 2013-06-27 DIAGNOSIS — S93401A Sprain of unspecified ligament of right ankle, initial encounter: Secondary | ICD-10-CM

## 2013-06-27 DIAGNOSIS — F329 Major depressive disorder, single episode, unspecified: Secondary | ICD-10-CM | POA: Insufficient documentation

## 2013-06-27 DIAGNOSIS — F3289 Other specified depressive episodes: Secondary | ICD-10-CM | POA: Insufficient documentation

## 2013-06-27 DIAGNOSIS — M549 Dorsalgia, unspecified: Secondary | ICD-10-CM | POA: Insufficient documentation

## 2013-06-27 DIAGNOSIS — Z923 Personal history of irradiation: Secondary | ICD-10-CM | POA: Insufficient documentation

## 2013-06-27 DIAGNOSIS — G8929 Other chronic pain: Secondary | ICD-10-CM | POA: Insufficient documentation

## 2013-06-27 DIAGNOSIS — Z8541 Personal history of malignant neoplasm of cervix uteri: Secondary | ICD-10-CM | POA: Insufficient documentation

## 2013-06-27 MED ORDER — HYDROCODONE-ACETAMINOPHEN 5-325 MG PO TABS: 1.0000 | ORAL_TABLET | Freq: Four times a day (QID) | ORAL | Status: DC | PRN

## 2013-06-27 MED ORDER — HYDROCODONE-ACETAMINOPHEN 5-325 MG PO TABS
1.0000 | ORAL_TABLET | Freq: Once | ORAL | Status: AC
  Administered 2013-06-27: 1 via ORAL
  Filled 2013-06-27: qty 1

## 2013-06-27 NOTE — ED Notes (Signed)
Pt arrives in wheelchair with family. Pt states she tripped over a rug yesterday and fell. Pt has swelling to top of R foot and some swelling to R ankle. Pt has taken Advil for the pain today. Pt with no acute distress.

## 2013-06-27 NOTE — ED Provider Notes (Signed)
History  This chart was scribed for Jeannett Senior, PA-C working with Jasper Riling. Alvino Chapel, MD by Frederich Balding, ED scribe. This patient was seen in room WTR7/WTR7 and the patient's care was started at 7:21 PM.  CSN: [id number] Arrival date & time 06/27/13  1903   No chief complaint on file.  The history is provided by the patient. No language interpreter was used.    HPI Comments: [name] is a 67 y.o. female who presents to the Emergency Department complaining of right ankle injury that happened yesterday. She states she has some right knee pain and her first three toes have some pain. Pain with bearing weight and movement of the ankle and foot. Pt states she tripped over a rug and fell. Pt states she did not get dizzy or light headed before she tripped. She states she has taken Advil for the pain today with no relief. Pt denies any other associated symptoms.   Past Medical History  Diagnosis Date  . Hypercholesteremia     takes Pravastatin daily  . Uterine cancer   . COPD (chronic obstructive pulmonary disease) 08/10/2012  . Hiatal hernia 08/10/2012  . 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 Hypertension     takes Tribenzor and Atenolol daily  . Stroke 2011  . Headache   . GERD (gastroesophageal reflux disease)     takes Zantac daily  . Chronic back pain   . Constipation     related to pain meds  . Nausea     takes Zofran prn  . Anxiety     takes Ativan prn  . Depression   . Insomnia     takes Amitriptyline daily  . Broken ribs   . Blood transfusion without reported diagnosis 09/15/12    2 units Prbc's  . Gastrostomy in place   . History of radiation therapy 10/17/12-11/25/12    supraglottic larynx   Past Surgical History  Procedure Laterality Date  . Abdominal surgery      r/t uterine carcinoma  . Tracheostomy tube placement  08/10/2012    Procedure: TRACHEOSTOMY;  Surgeon: Jerrell Belfast, MD;  Location: [facility];  Service: ENT;   Laterality: N/A;  . Laryngoscopy  08/10/2012    Procedure: LARYNGOSCOPY;  Surgeon: Jerrell Belfast, MD;  Location: [facility];  Service: ENT;  Laterality: N/A;  with biopsy  . Appendectomy    . Hernia repair    . Gastrostomy w/ feeding tube      placed last week  . Laryngetomy  08/31/2012    Procedure: LARYNGECTOMY;  Surgeon: Jerrell Belfast, MD;  Location: [place];  Service: ENT;  Laterality: N/A;  . Radical neck dissection  08/31/2012    Procedure: RADICAL NECK DISSECTION;  Surgeon: Jerrell Belfast, MD;  Location: [place];  Service: ENT;  Laterality: Bilateral;   No family history on file. History  Substance Use Topics  . Smoking status: Former Smoker -- 2.00 packs/day for 50 years    Quit date: 08/08/2012  . Smokeless tobacco: Never Used     Comment: quit 08/08/12  . Alcohol Use: No   OB History   Grav Para Term Preterm Abortions TAB SAB Ect Mult Living                 Review of Systems  Musculoskeletal: Positive for joint swelling and arthralgias.  All other systems reviewed and are negative.    Allergies  Bacitracin  Home Medications   Current Outpatient Rx  Name  Route  Sig  Dispense  Refill  . amLODipine (NORVASC) 2.5 MG tablet   Per Tube   Place 1 tablet (2.5 mg total) into feeding tube daily.   30 tablet   3   . amLODipine (NORVASC) 2.5 MG tablet   Per Tube   Place 2.5 mg into feeding tube. Take 1 tablet (2.5 mg total) by mouth daily.         [name] antiseptic oral rinse (BIOTENE) LIQD   Mouth Rinse   15 mLs by Mouth Rinse route 2 times daily at 12 noon and 4 pm.   237 mL   1   . atenolol (TENORMIN) 12.5 mg TABS   Per Tube   Place 1 tablet (25 mg total) into feeding tube 2 (two) times daily.   30 tablet   3   . atenolol (TENORMIN) 25 MG tablet   Per Tube   Place 25 mg into feeding tube. Take 1 tablet (25 mg total) by mouth 2 (two) times a day as needed.         [name] Dextromethorphan-Guaifenesin (MUCINEX FAST-MAX DM MAX) 5-100 MG/5ML LIQD   Gastrostomy Tube    5 mLs by Gastrostomy Tube route. Take by mouth.         [name] FLUoxetine (PROZAC) 20 MG capsule   Per Tube   Place 20 mg into feeding tube. Take 1 capsule (20 mg total) by mouth daily.         . hyaluronate sodium (RADIAPLEXRX) GEL   Topical   Apply 1 application topically 2 (two) times daily.         [name] HYDROcodone-acetaminophen (HYCET) 7.5-325 mg/15 ml solution      TAKE 15 MLS BY MOUTH EVERY 6 HOURS AS NEEDED FOR PAIN   473 mL   0   . LORazepam (ATIVAN) 1 MG tablet   Oral   Take 1 tablet (1 mg total) by mouth 2 (two) times daily.   60 tablet   3   . metoCLOPramide (REGLAN) 5 MG/5ML solution   Oral   Take 5 mg by mouth every 8 (eight) hours. Changed to 2.5mg   Instead of the 5mg  per Dr. Claiborne Rigg         . nicotine (NICODERM CQ - DOSED IN MG/24 HOURS) 21 mg/24hr patch   Transdermal   Place 1 patch onto the skin daily.   28 patch   0   . Nutritional Supplements (FEEDING SUPPLEMENT, OSMOLITE 1.2 CAL,) LIQD   Per Tube   Place 240 mLs into feeding tube. Take 474 mLs by mouth 3 (three) times a day.         . ondansetron (ZOFRAN) 8 MG tablet   Per Tube   Place 8 mg into feeding tube. Take 1 tablet (8 mg total) by mouth every 8 (eight) hours as needed.         . ranitidine (ZANTAC) 15 MG/ML syrup   Per Tube   Place 10 mLs into feeding tube. TAKE 10 MLS BY MOUTH DAILY         . Water For Irrigation, Sterile (FREE WATER) SOLN   Per Tube   Place 30-60 mLs into feeding tube every 6 (six) hours.          BP 149/64  Pulse 76  Temp(Src) 98.8 F (37.1 C) (Oral)  Resp 17  SpO2 98%  Physical Exam  Nursing note and vitals reviewed. Constitutional: She is oriented to person, place, and time. She appears well-developed and  well-nourished. No distress.  HENT:  Head: Normocephalic and atraumatic.  Eyes: Conjunctivae and EOM are normal.  Neck: Normal range of motion. Neck supple. No tracheal deviation present.  Cardiovascular: Normal rate.   Pulmonary/Chest:  Effort normal. No respiratory distress.  Musculoskeletal:  Swelling over later aspect of the right ankle and right foot. Tender to palpation over medial and radial malleoli. Tender over achilles tendon. Achilles tendon is intact. Diffuse foot tenderness. Tender to anterior lateral right knee. Did not check ROM due to pain.   Neurological: She is alert and oriented to person, place, and time.  Skin: Skin is warm and dry.  Psychiatric: She has a normal mood and affect. Her behavior is normal.    ED Course  Procedures (including critical care time)  DIAGNOSTIC STUDIES: Oxygen Saturation is 98% on RA, normal by my interpretation.    COORDINATION OF CARE: 7:38 PM-Discussed treatment plan which includes xrays with pt at bedside and pt agreed to plan.   Labs Reviewed - No data to display Dg Ankle Complete Right  06/27/2013   *RADIOLOGY REPORT*  Clinical Data: Fall with ankle and foot pain.  RIGHT ANKLE - COMPLETE 3+ VIEW  Comparison: None.  Findings: Periarticular swelling without definitive fracture or malalignment. Faint ossific density lateral to the foot is most likely a small ossicle given contemporaneous foot radiograph and lack of donor site.  IMPRESSION: Periarticular swelling without evidence of acute ankle fracture or malalignment.   Original Report Authenticated By: Jorje Guild   Dg Knee Complete 4 Views Right  06/27/2013   *RADIOLOGY REPORT*  Clinical Data: Fall with knee pain.  RIGHT KNEE - COMPLETE 4+ VIEW  Comparison: None.  Findings: Negative for fracture, effusion, or malalignment.  No joint narrowing.  No acute soft tissue abnormality detected.  IMPRESSION: Negative right knee radiograph.   Original Report Authenticated By: Jorje Guild   Dg Foot Complete Right  06/27/2013   *RADIOLOGY REPORT*  Clinical Data: Fall with ankle and foot pain.  RIGHT FOOT COMPLETE - 3+ VIEW  Comparison: None.  Findings:   Negative for fracture or malalignment.  No radiodense foreign body.   IMPRESSION: Negative right foot study.   Original Report Authenticated By: Jorje Guild   1. Ankle sprain, right, initial encounter   2. Foot sprain, right, initial encounter   3. Knee contusion, right, initial encounter     MDM  Pt with right ankle and foot swelling. Right knee pain after a mechanical fall yesterday. Pt states she is unable to walk on it. X-rays negative. There is significant swelling to the ankle and foot. ACE wrap applied. Crutches given. Home with norco for pain, elevation at home, rest, ice. Follow up with orthopedics.   Filed Vitals:   06/27/13 1925  BP: 149/64  Pulse: 76  Temp: 98.8 F (37.1 C)  TempSrc: Oral  Resp: 17  SpO2: 98%    I personally performed the services described in this documentation, which was scribed in my presence. The recorded information has been reviewed and is accurate.   Renold Genta, PA-C 06/27/13 2315

## 2013-06-28 NOTE — ED Provider Notes (Signed)
Medical screening examination/treatment/procedure(s) were performed by non-physician practitioner and as supervising physician I was immediately available for consultation/collaboration.  Jasper Riling. Alvino Chapel, MD 06/28/13 [phone]

## 2013-07-18 ENCOUNTER — Ambulatory Visit: Admission: RE | Admit: 2013-07-18 | Payer: Medicare Other | Source: Ambulatory Visit | Admitting: Radiation Oncology

## 2013-08-08 ENCOUNTER — Ambulatory Visit: Admission: RE | Admit: 2013-08-08 | Payer: Medicare Other | Source: Ambulatory Visit | Admitting: Radiation Oncology

## 2013-08-11 ENCOUNTER — Telehealth: Payer: Self-pay | Admitting: Oncology

## 2013-08-11 NOTE — Telephone Encounter (Signed)
Received a call from [name] at [name] asking that Dr. Valere Dross sign insurance documents for [name]'s enteral and tracheostomy supplies.  Advised her that [name] next follow up is in October and Dr. Valere Dross may not want to sign them until then.  She said that she would send them for Dr. Valere Dross to review and that it is OK if he does not want to sign them until she is seen.  They will continue providing her supplies and the paperwork is for insurance purposes.

## 2013-08-14 NOTE — Telephone Encounter (Signed)
She needs to contact Dr. [name] regarding trach supplies. I will take care of enteral supply order. She needs to see me first, having missed her last appointment. Thanks, RM

## 2013-08-18 ENCOUNTER — Telehealth: Payer: Self-pay | Admitting: *Deleted

## 2013-08-18 NOTE — Telephone Encounter (Signed)
Spoke w/Blair Juline Patch, [facility] re: faxed request for trach supplies for pt. Informed her that Dr [name] is handling pt's trach supply needs. Benjie Karvonen verbalized understanding and stated she would fax her request to Dr Victorio Palm office.

## 2013-08-31 ENCOUNTER — Other Ambulatory Visit: Payer: Self-pay | Admitting: Otolaryngology

## 2013-09-01 ENCOUNTER — Encounter (HOSPITAL_COMMUNITY): Payer: Self-pay | Admitting: Pharmacy Technician

## 2013-09-04 NOTE — Addendum Note (Signed)
Addended by: [name], Epifanio Labrador on: 09/04/2013 05:44 PM   Modules accepted: Orders

## 2013-09-06 ENCOUNTER — Encounter (HOSPITAL_COMMUNITY)
Admission: RE | Admit: 2013-09-06 | Discharge: 2013-09-06 | Disposition: A | Discharge status: Home / Self Care | Payer: Medicare Other | Source: Ambulatory Visit | Attending: Otolaryngology | Admitting: Otolaryngology

## 2013-09-06 ENCOUNTER — Ambulatory Visit (HOSPITAL_COMMUNITY)
Admission: RE | Admit: 2013-09-06 | Discharge: 2013-09-06 | Disposition: A | Discharge status: Home / Self Care | Payer: Medicare Other | Source: Ambulatory Visit | Attending: Anesthesiology | Admitting: Anesthesiology

## 2013-09-06 ENCOUNTER — Encounter (HOSPITAL_COMMUNITY): Payer: Self-pay

## 2013-09-06 DIAGNOSIS — C320 Malignant neoplasm of glottis: Secondary | ICD-10-CM | POA: Insufficient documentation

## 2013-09-06 DIAGNOSIS — I10 Essential (primary) hypertension: Secondary | ICD-10-CM | POA: Insufficient documentation

## 2013-09-06 DIAGNOSIS — Z01810 Encounter for preprocedural cardiovascular examination: Secondary | ICD-10-CM | POA: Insufficient documentation

## 2013-09-06 DIAGNOSIS — Z01812 Encounter for preprocedural laboratory examination: Secondary | ICD-10-CM | POA: Insufficient documentation

## 2013-09-06 DIAGNOSIS — Z01818 Encounter for other preprocedural examination: Secondary | ICD-10-CM | POA: Insufficient documentation

## 2013-09-06 LAB — CBC
HCT: 39.2 % (ref 36.0–46.0)
Hemoglobin: 12.7 g/dL (ref 12.0–15.0)
MCH: 27.9 pg (ref 26.0–34.0)
MCHC: 32.4 g/dL (ref 30.0–36.0)
MCV: 86 fL (ref 78.0–100.0)
Platelets: 218 10*3/uL (ref 150–400)
RBC: 4.56 MIL/uL (ref 3.87–5.11)
RDW: 13.3 % (ref 11.5–15.5)
WBC: 9 10*3/uL (ref 4.0–10.5)

## 2013-09-06 LAB — BASIC METABOLIC PANEL WITH GFR
BUN: 29 mg/dL — ABNORMAL HIGH (ref 6–23)
CO2: 27 meq/L (ref 19–32)
Calcium: 10.2 mg/dL (ref 8.4–10.5)
Chloride: 100 meq/L (ref 96–112)
Creatinine, Ser: 1.18 mg/dL — ABNORMAL HIGH (ref 0.50–1.10)
GFR calc Af Amer: 54 mL/min — ABNORMAL LOW
GFR calc non Af Amer: 47 mL/min — ABNORMAL LOW
Glucose, Bld: 99 mg/dL (ref 70–99)
Potassium: 4.6 meq/L (ref 3.5–5.1)
Sodium: 138 meq/L (ref 135–145)

## 2013-09-06 NOTE — Pre-Procedure Instructions (Addendum)
[name]  09/06/2013   Your procedure is scheduled on:  09/08/13  Report to [place] short stay [place] tower admitting at 530* AM.  Call this number if you have problems the morning of surgery: [phone]   Remember:   Do not eat food or drink liquids after midnight.   Take these medicines the morning of surgery with A SIP OF WATER: amlodipine, atenolol, prozac, ativan if needed          STOP ibuprofen now   Do not wear jewelry, make-up or nail polish.  Do not wear lotions, powders, or perfumes. You may wear deodorant.  Do not shave 48 hours prior to surgery. Men may shave face and neck.  Do not bring valuables to the hospital.  [facility] is not responsible                  for any belongings or valuables.               Contacts, dentures or bridgework may not be worn into surgery.  Leave suitcase in the car. After surgery it may be brought to your room.  For patients admitted to the hospital, discharge time is determined by your                treatment team.               Patients discharged the day of surgery will not be allowed to drive  home.  Name and phone number of your driver:   Special Instructions: Shower using CHG 2 nights before surgery and the night before surgery.  If you shower the day of surgery use CHG.  Use special wash - you have one bottle of CHG for all showers.  You should use approximately 1/3 of the bottle for each shower.   Please read over the following fact sheets that you were given: Pain Booklet, Coughing and Deep Breathing and Surgical Site Infection Prevention

## 2013-09-06 NOTE — Progress Notes (Signed)
Son was interpretor and will do so day of surgery

## 2013-09-07 MED ORDER — CEFAZOLIN SODIUM-DEXTROSE 2-3 GM-% IV SOLR
2.0000 g | INTRAVENOUS | Status: AC
  Administered 2013-09-08: 2 g via INTRAVENOUS
  Filled 2013-09-07: qty 50

## 2013-09-07 MED ORDER — DEXAMETHASONE SODIUM PHOSPHATE 10 MG/ML IJ SOLN
10.0000 mg | Freq: Once | INTRAMUSCULAR | Status: AC
  Administered 2013-09-08: 10 mg via INTRAVENOUS
  Filled 2013-09-07: qty 1

## 2013-09-08 ENCOUNTER — Encounter (HOSPITAL_COMMUNITY): Payer: Self-pay | Admitting: Anesthesiology

## 2013-09-08 ENCOUNTER — Ambulatory Visit (HOSPITAL_COMMUNITY)
Admission: RE | Admit: 2013-09-08 | Discharge: 2013-09-08 | Disposition: A | Discharge status: Home / Self Care | Payer: Medicare Other | Source: Ambulatory Visit | Attending: Otolaryngology | Admitting: Otolaryngology

## 2013-09-08 ENCOUNTER — Ambulatory Visit (HOSPITAL_COMMUNITY): Payer: Medicare Other | Admitting: Anesthesiology

## 2013-09-08 ENCOUNTER — Encounter (HOSPITAL_COMMUNITY): Payer: Self-pay | Admitting: *Deleted

## 2013-09-08 ENCOUNTER — Encounter (HOSPITAL_COMMUNITY)
Admission: RE | Disposition: A | Discharge status: Home / Self Care | Payer: Self-pay | Source: Ambulatory Visit | Attending: Otolaryngology

## 2013-09-08 DIAGNOSIS — Z8521 Personal history of malignant neoplasm of larynx: Secondary | ICD-10-CM | POA: Insufficient documentation

## 2013-09-08 DIAGNOSIS — Z9221 Personal history of antineoplastic chemotherapy: Secondary | ICD-10-CM | POA: Insufficient documentation

## 2013-09-08 DIAGNOSIS — C320 Malignant neoplasm of glottis: Secondary | ICD-10-CM

## 2013-09-08 DIAGNOSIS — J449 Chronic obstructive pulmonary disease, unspecified: Secondary | ICD-10-CM | POA: Insufficient documentation

## 2013-09-08 DIAGNOSIS — I10 Essential (primary) hypertension: Secondary | ICD-10-CM | POA: Insufficient documentation

## 2013-09-08 DIAGNOSIS — Z9089 Acquired absence of other organs: Secondary | ICD-10-CM | POA: Insufficient documentation

## 2013-09-08 DIAGNOSIS — Z79899 Other long term (current) drug therapy: Secondary | ICD-10-CM | POA: Insufficient documentation

## 2013-09-08 DIAGNOSIS — J4489 Other specified chronic obstructive pulmonary disease: Secondary | ICD-10-CM | POA: Insufficient documentation

## 2013-09-08 DIAGNOSIS — R491 Aphonia: Secondary | ICD-10-CM | POA: Insufficient documentation

## 2013-09-08 HISTORY — PX: TRACHEAL ESOPHOGEAL PUNCTURE WITH REPAIR STOMA: SHX5678

## 2013-09-08 SURGERY — TRACHEAL ESOPHOGEAL PUNCTURE WITH REPAIR STOMA
Anesthesia: General | Site: Neck | Wound class: Clean

## 2013-09-08 MED ORDER — LIDOCAINE-EPINEPHRINE 1 %-1:100000 IJ SOLN
INTRAMUSCULAR | Status: DC | PRN
  Administered 2013-09-08: 20 mL

## 2013-09-08 MED ORDER — AMOXICILLIN-POT CLAVULANATE 250-62.5 MG/5ML PO SUSR: 10.0000 mL | Freq: Two times a day (BID) | ORAL | Status: DC

## 2013-09-08 MED ORDER — HYDROCODONE-ACETAMINOPHEN 7.5-325 MG/15ML PO SOLN: 15.0000 mL | Freq: Four times a day (QID) | ORAL | Status: DC | PRN

## 2013-09-08 MED ORDER — ONDANSETRON HCL 4 MG/2ML IJ SOLN
INTRAMUSCULAR | Status: DC | PRN
  Administered 2013-09-08: 4 mg via INTRAVENOUS

## 2013-09-08 MED ORDER — MIDAZOLAM HCL 5 MG/5ML IJ SOLN
INTRAMUSCULAR | Status: DC | PRN
  Administered 2013-09-08: 1 mg via INTRAVENOUS

## 2013-09-08 MED ORDER — PROPOFOL 10 MG/ML IV BOLUS
INTRAVENOUS | Status: DC | PRN
  Administered 2013-09-08: 20 mg via INTRAVENOUS
  Administered 2013-09-08: 50 mg via INTRAVENOUS
  Administered 2013-09-08: 200 mg via INTRAVENOUS

## 2013-09-08 MED ORDER — LIDOCAINE-EPINEPHRINE 1 %-1:100000 IJ SOLN
INTRAMUSCULAR | Status: AC
  Filled 2013-09-08: qty 1

## 2013-09-08 MED ORDER — OXYCODONE HCL 5 MG/5ML PO SOLN: 5.0000 mg | Freq: Once | ORAL | Status: DC | PRN

## 2013-09-08 MED ORDER — OXYCODONE HCL 5 MG PO TABS: 5.0000 mg | ORAL_TABLET | Freq: Once | ORAL | Status: DC | PRN

## 2013-09-08 MED ORDER — 0.9 % SODIUM CHLORIDE (POUR BTL) OPTIME
TOPICAL | Status: DC | PRN
  Administered 2013-09-08: 1000 mL

## 2013-09-08 MED ORDER — HYDROMORPHONE HCL PF 1 MG/ML IJ SOLN
INTRAMUSCULAR | Status: AC
  Filled 2013-09-08: qty 1

## 2013-09-08 MED ORDER — FENTANYL CITRATE 0.05 MG/ML IJ SOLN
INTRAMUSCULAR | Status: DC | PRN
  Administered 2013-09-08: 50 ug via INTRAVENOUS
  Administered 2013-09-08 (×2): 25 ug via INTRAVENOUS

## 2013-09-08 MED ORDER — PROMETHAZINE HCL 25 MG/ML IJ SOLN: 6.2500 mg | INTRAMUSCULAR | Status: DC | PRN

## 2013-09-08 MED ORDER — HYDROMORPHONE HCL PF 1 MG/ML IJ SOLN
0.2500 mg | INTRAMUSCULAR | Status: DC | PRN
  Administered 2013-09-08 (×2): 0.5 mg via INTRAVENOUS

## 2013-09-08 MED ORDER — LIDOCAINE HCL (CARDIAC) 20 MG/ML IV SOLN
INTRAVENOUS | Status: DC | PRN
  Administered 2013-09-08: 100 mg via INTRAVENOUS

## 2013-09-08 SURGICAL SUPPLY — 52 items
APL SKNCLS STERI-STRIP NONHPOA (GAUZE/BANDAGES/DRESSINGS)
BENZOIN TINCTURE PRP APPL 2/3 (GAUZE/BANDAGES/DRESSINGS) IMPLANT
BLADE SURG 11 STRL SS (BLADE) IMPLANT
BLADE SURG 15 STRL LF DISP TIS (BLADE) IMPLANT
BLADE SURG 15 STRL SS (BLADE) ×2
BLADE SURG ROTATE 9660 (MISCELLANEOUS) IMPLANT
CANISTER SUCTION 2500CC (MISCELLANEOUS) ×2 IMPLANT
CATH ROBINSON RED A/P 10FR (CATHETERS) IMPLANT
CATH ROBINSON RED A/P 14FR (CATHETERS) IMPLANT
CATH ROBINSON RED A/P 16FR (CATHETERS) IMPLANT
CATH ROBINSON RED A/P 20FR (CATHETERS) IMPLANT
CLEANER TIP ELECTROSURG 2X2 (MISCELLANEOUS) ×1 IMPLANT
CLOTH BEACON ORANGE TIMEOUT ST (SAFETY) ×2 IMPLANT
COVER MAYO STAND STRL (DRAPES) ×2 IMPLANT
COVER SURGICAL LIGHT HANDLE (MISCELLANEOUS) ×2 IMPLANT
COVER TABLE BACK 60X90 (DRAPES) ×2 IMPLANT
CRADLE DONUT ADULT HEAD (MISCELLANEOUS) ×1 IMPLANT
DRAPE PROXIMA HALF (DRAPES) ×2 IMPLANT
ELECT COATED BLADE 2.86 ST (ELECTRODE) ×1 IMPLANT
ELECT REM PT RETURN 9FT ADLT (ELECTROSURGICAL) ×2
ELECTRODE REM PT RTRN 9FT ADLT (ELECTROSURGICAL) IMPLANT
GAUZE SPONGE 4X4 16PLY XRAY LF (GAUZE/BANDAGES/DRESSINGS) ×1 IMPLANT
GLOVE BIO SURGEON STRL SZ 6.5 (GLOVE) ×1 IMPLANT
GLOVE BIO SURGEON STRL SZ7 (GLOVE) ×1 IMPLANT
GLOVE BIO SURGEON STRL SZ7.5 (GLOVE) ×1 IMPLANT
GLOVE BIOGEL M 7.0 STRL (GLOVE) ×3 IMPLANT
GLOVE BIOGEL PI IND STRL 7.0 (GLOVE) IMPLANT
GLOVE BIOGEL PI IND STRL 7.5 (GLOVE) IMPLANT
GLOVE BIOGEL PI INDICATOR 7.0 (GLOVE) ×1
GLOVE BIOGEL PI INDICATOR 7.5 (GLOVE) ×1
GLOVE SURG SS PI 7.0 STRL IVOR (GLOVE) ×1 IMPLANT
GOWN STRL NON-REIN LRG LVL3 (GOWN DISPOSABLE) ×4 IMPLANT
IV CATH 14GX2 1/4 (CATHETERS) ×2 IMPLANT
KIT BASIN OR (CUSTOM PROCEDURE TRAY) ×1 IMPLANT
KIT ROOM TURNOVER OR (KITS) ×2 IMPLANT
NDL HYPO 25GX1X1/2 BEV (NEEDLE) IMPLANT
NEEDLE 27GAX1X1/2 (NEEDLE) IMPLANT
NEEDLE HYPO 25GX1X1/2 BEV (NEEDLE) ×2 IMPLANT
NS IRRIG 1000ML POUR BTL (IV SOLUTION) ×2 IMPLANT
PAD ARMBOARD 7.5X6 YLW CONV (MISCELLANEOUS) ×3 IMPLANT
PENCIL FOOT CONTROL (ELECTRODE) ×1 IMPLANT
PROSTHESIS PROVOX VEGA 20FRX8 (Orthopedic Implant) ×1 IMPLANT
SPONGE DRAIN TRACH 4X4 STRL 2S (GAUZE/BANDAGES/DRESSINGS) IMPLANT
STRIP CLOSURE SKIN 1/2X4 (GAUZE/BANDAGES/DRESSINGS) IMPLANT
SUCTION FRAZIER TIP 10 FR DISP (SUCTIONS) IMPLANT
SURGILUBE 2OZ TUBE FLIPTOP (MISCELLANEOUS) IMPLANT
SUT SILK 1 SH (SUTURE) IMPLANT
SUT SILK 2 0 FS (SUTURE) IMPLANT
SYR CONTROL 10ML LL (SYRINGE) ×1 IMPLANT
TOWEL OR 17X24 6PK STRL BLUE (TOWEL DISPOSABLE) ×2 IMPLANT
TUBE CONNECTING 12X1/4 (SUCTIONS) ×2 IMPLANT
WATER STERILE IRR 1000ML POUR (IV SOLUTION) ×1 IMPLANT

## 2013-09-08 NOTE — Anesthesia Procedure Notes (Signed)
Procedure Name: Intubation Date/Time: 09/08/2013 7:45 AM Performed by: Sampson Si E Pre-anesthesia Checklist: Patient identified, Emergency Drugs available, Suction available, Patient being monitored and Timeout performed Patient Re-evaluated:Patient Re-evaluated prior to inductionOxygen Delivery Method: Circle system utilized Preoxygenation: Pre-oxygenation with 100% oxygen Intubation Type: IV induction Ventilation: Unable to mask ventilate Number of attempts: 1 Placement Confirmation: positive ETCO2 and breath sounds checked- equal and bilateral Comments: Tube passed through stoma, positive ETCo2 and chest rise confirm placement

## 2013-09-08 NOTE — H&P (Signed)
[name] is an 67 y.o. female.   Chief Complaint: S/p laryngectomy, aphonic HPI: s/p total laryngectomy for SCCa 1 year ago, tx'ed with chemoxrt.   Past Medical History  Diagnosis Date  . Hypercholesteremia     takes Pravastatin daily  . Uterine cancer   . Hiatal hernia 08/10/2012  . 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 Hypertension     takes Tribenzor and Atenolol daily  . Headache(784.0)   . GERD (gastroesophageal reflux disease)     takes Zantac daily  . Chronic back pain   . Constipation     related to pain meds  . Nausea     takes Zofran prn  . Anxiety     takes Ativan prn  . Depression   . Insomnia     takes Amitriptyline daily  . Broken ribs   . Blood transfusion without reported diagnosis 09/15/12    2 units Prbc's  . Gastrostomy in place   . History of radiation therapy 10/17/12-11/25/12    supraglottic larynx  . COPD (chronic obstructive pulmonary disease) 08/10/2012    denies  . Stroke 2011    denies    Past Surgical History  Procedure Laterality Date  . Abdominal surgery      r/t uterine carcinoma  . Tracheostomy tube placement  08/10/2012    Procedure: TRACHEOSTOMY;  Surgeon: Ann Belfast, MD;  Location: [facility];  Service: ENT;  Laterality: N/A;  . Laryngoscopy  08/10/2012    Procedure: LARYNGOSCOPY;  Surgeon: Ann Belfast, MD;  Location: [facility];  Service: ENT;  Laterality: N/A;  with biopsy  . Appendectomy    . Gastrostomy w/ feeding tube  13  . Laryngetomy  08/31/2012    Procedure: LARYNGECTOMY;  Surgeon: Ann Belfast, MD;  Location: [place];  Service: ENT;  Laterality: N/A;  . Radical neck dissection  08/31/2012    Procedure: RADICAL NECK DISSECTION;  Surgeon: Ann Belfast, MD;  Location: [place];  Service: ENT;  Laterality: Bilateral;  . Hernia repair      child    History reviewed. No pertinent family history. Social History:  reports that she quit smoking about 13 months ago. Her smoking use included  Cigarettes. She has a 100 pack-year smoking history. She has never used smokeless tobacco. She reports that she does not drink alcohol or use illicit drugs.  Allergies:  Allergies  Allergen Reactions  . Bacitracin Rash    Medications Prior to Admission  Medication Sig Dispense Refill  . amLODipine (NORVASC) 2.5 MG tablet Take 2.5 mg by mouth. Take 1 tablet (2.5 mg total) by mouth daily.      [name] atenolol (TENORMIN) 25 MG tablet Take 25 mg by mouth 2 (two) times daily. Take 1 tablet (25 mg total) by mouth 2 (two) times a day as needed.      [name] FLUoxetine (PROZAC) 20 MG capsule Place 20 mg into feeding tube. Take 1 capsule (20 mg total) by mouth daily.      [name] ibuprofen (ADVIL,MOTRIN) 200 MG tablet Take 400 mg by mouth every 6 (six) hours as needed for pain.      [name] loratadine (CLARITIN) 10 MG tablet Take 10 mg by mouth daily.      [name] LORazepam (ATIVAN) 1 MG tablet Take 1 tablet (1 mg total) by mouth 2 (two) times daily.  60 tablet  3  . rosuvastatin (CRESTOR) 10 MG tablet Take 10 mg by mouth daily.      [name]  Dextromethorphan-Guaifenesin (MUCINEX FAST-MAX DM MAX) 5-100 MG/5ML LIQD Take 5 mLs by mouth as needed (congestion). Take by mouth.      . ondansetron (ZOFRAN) 8 MG tablet Take 8 mg by mouth every 8 (eight) hours as needed for nausea.      . traZODone (DESYREL) 50 MG tablet Take 50 mg by mouth daily.        Results for orders placed during the hospital encounter of 09/06/13 (from the past 48 hour(s))  BASIC METABOLIC PANEL     Status: Abnormal   Collection Time    09/06/13  1:45 PM      Result Value Range   Sodium 138  135 - 145 mEq/L   Potassium 4.6  3.5 - 5.1 mEq/L   Chloride 100  96 - 112 mEq/L   CO2 27  19 - 32 mEq/L   Glucose, Bld 99  70 - 99 mg/dL   BUN 29 (*) 6 - 23 mg/dL   Creatinine, Ser 1.18 (*) 0.50 - 1.10 mg/dL   Calcium 10.2  8.4 - 10.5 mg/dL   GFR calc non Af Amer 47 (*) >90 mL/min   GFR calc Af Amer 54 (*) >90 mL/min   Comment: (NOTE)     The eGFR has been calculated  using the CKD EPI equation.     This calculation has not been validated in all clinical situations.     eGFR's persistently <90 mL/min signify possible Chronic Kidney     Disease.  CBC     Status: None   Collection Time    09/06/13  1:45 PM      Result Value Range   WBC 9.0  4.0 - 10.5 K/uL   RBC 4.56  3.87 - 5.11 MIL/uL   Hemoglobin 12.7  12.0 - 15.0 g/dL   HCT 39.2  36.0 - 46.0 %   MCV 86.0  78.0 - 100.0 fL   MCH 27.9  26.0 - 34.0 pg   MCHC 32.4  30.0 - 36.0 g/dL   RDW 13.3  11.5 - 15.5 %   Platelets 218  150 - 400 K/uL   Dg Chest 2 View  09/06/2013   CLINICAL DATA:  Pre admit tracheoesophageal puncture  EXAM: CHEST  2 VIEW  COMPARISON:  09/01/2012  FINDINGS: The lungs are clear. Negative for infiltrate or effusion. Mild apical pleural scarring bilaterally. Negative for mass. Negative for heart failure.  IMPRESSION: No active cardiopulmonary abnormality.   Electronically Signed   By: Franchot Gallo M.D.   On: 09/06/2013 14:10    Review of Systems  Constitutional: Negative.   HENT: Negative.   Respiratory: Negative.   Cardiovascular: Negative.   Musculoskeletal: Negative.   Neurological: Negative.     Blood pressure 194/79, pulse 59, temperature 99.1 F (37.3 C), temperature source Oral, resp. rate 18, SpO2 99.00%. Physical Exam  Constitutional: She appears well-developed and well-nourished.  Neck: Normal range of motion.  Stoma intact No d/c or bleeding  Cardiovascular: Normal rate.   Respiratory: Effort normal.  GI: Soft.  Musculoskeletal: Normal range of motion.     Assessment/Plan Pt adm for TEP and speech prosthesis.  [place], [name] 09/08/2013, 7:35 AM

## 2013-09-08 NOTE — Transfer of Care (Signed)
Immediate Anesthesia Transfer of Care Note  Patient: [name]  Procedure(s) Performed: Procedure(s): TRACHEAL ESOPHOGEAL PUNCTURE WITH PLACEMENT OF  PROVOX PROSTHESIS  (N/A)  Patient Location: PACU  Anesthesia Type:General  Level of Consciousness: awake, alert  and oriented  Airway & Oxygen Therapy: Patient Spontanous Breathing and patient attached to face mask over stoma  Post-op Assessment: Report given to PACU RN  Post vital signs: Reviewed and stable  Complications: No apparent anesthesia complications

## 2013-09-08 NOTE — Op Note (Signed)
[name], [name]              ACCOUNT NO.:  [id number]  MEDICAL RECORD NO.:  [id number]  LOCATION:                               FACILITY:  [facility]  PHYSICIAN:  Early Chars. [name], M.D.DATE OF BIRTH:  Oct 19, 1946  DATE OF PROCEDURE:  09/08/2013 DATE OF DISCHARGE:  09/08/2013                              OPERATIVE REPORT   PREOPERATIVE DIAGNOSES: 1. Status post laryngectomy for squamous cell carcinoma. 2. Status post chemoradiation therapy. 3. Aphonic after laryngectomy.  POSTOPERATIVE DIAGNOSES: 1. Status post laryngectomy for squamous cell carcinoma. 2. Status post chemoradiation therapy. 3. Aphonic after laryngectomy.  INDICATION FOR SURGERY: 1. Status post laryngectomy for squamous cell carcinoma. 2. Status post chemoradiation therapy. 3. Aphonic after laryngectomy.  SURGICAL PROCEDURE:  Tracheoesophageal puncture with placement of voice prosthesis.  COMPLICATIONS:  None.  ESTIMATED BLOOD LOSS:  Minimal.  DISPOSITION:  The patient was transferred from the operating room to the recovery room in stable condition.  ANESTHESIA:  General endotracheal.  SURGEON:  Early Chars. [name], MD.  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  She was treated with subsequent chemoradiation therapy and did well without evidence of recurrent disease at 1 year.  The patient has been a aphonic since her laryngectomy and desired a speech prosthesis.  The risks and benefits of tracheoesophageal puncture and placement of speech prosthesis were discussed in detail with the patient, a nurse, and her family and they understood and concurred with our plan for surgery which is scheduled on elective basis at [facility]:  The patient was brought to the operating room on September 08, 2013, and  placed in supine position on the operating table.  General endotracheal anesthesia was established via the patient's existing laryngostomy after she was adequately anesthetized, she was prepped and draped in a sterile fashion and positioned on the operating table.  Procedure was begun with direct esophagoscopy using a pediatric esophagoscope.  The patient's oropharynx, pharynx and neoesophagus were examined.  There was some scarring at the previous closure site, but no evidence of mass or tumor.  No recurrent disease.  The [employer] speech prosthesis system was utilized via a Water quality scientist.  A 20- gauge curved needle was used to penetrate the posterior wall of the trachea.  The needle was visualized in the lumen of the esophagus and passed into the lumen of the esophagoscope.  A wire guide was then passed through the needle and brought out through the patient's oral cavity.  The needle was withdrawn.  The esophagoscope was withdrawn. The guidewire was in proper position.  The dilator and prosthesis were then inserted over the oral and of the guidewire and gently passed through the oropharynx through the tracheoesophageal puncture and into proper position in the patient's trachea.  The prosthesis popped into appropriate position, and there was no bleeding.  The wire and the remainder of the guide system were completely removed and the prosthesis was left intact in its appropriate anatomic position along the posterior wall of the patient's trachea.  The  prostate was rotated into proper position.  The patient was then awakened from her anesthetic, extubated, and transferred from the operating room to the recovery room in stable condition.  There were no complications.  No blood loss or minimal blood loss.    ______________________________ Early Chars [name], M.D.   ______________________________ Early Chars. [name], M.D.    DLS/MEDQ  D:  [id number]  T:  09/08/2013   Job:  [id number]

## 2013-09-08 NOTE — Brief Op Note (Signed)
09/08/2013  8:28 AM  PATIENT:  [name]  67 y.o. female  PRE-OPERATIVE DIAGNOSIS:  HISTORY OF CANCER OF LARYNX  POST-OPERATIVE DIAGNOSIS:  HISTORY OF CANCER OF LARYNX  PROCEDURE:  Procedure(s): TRACHEAL ESOPHOGEAL PUNCTURE WITH PLACEMENT OF  PROVOX PROSTHESIS  (N/A)  SURGEON:  Surgeon(s) and Role:    * Jerrell Belfast, MD - Primary  PHYSICIAN ASSISTANT: Jolene Provost, PA  ASSISTANTS:  ANESTHESIA:   general  EBL:   Min  BLOOD ADMINISTERED:none  DRAINS: none   LOCAL MEDICATIONS USED:  NONE  SPECIMEN:  No Specimen  DISPOSITION OF SPECIMEN:  N/A  COUNTS:  YES  TOURNIQUET:  * No tourniquets in log *  DICTATION: .Other Dictation: Dictation Number [id number]  PLAN OF CARE: Discharge to home after PACU  PATIENT DISPOSITION:  PACU - hemodynamically stable.   Delay start of Pharmacological VTE agent (>24hrs) due to surgical blood loss or risk of bleeding: not applicable

## 2013-09-08 NOTE — Anesthesia Postprocedure Evaluation (Signed)
Anesthesia Post Note  Patient: [name]  Procedure(s) Performed: Procedure(s) (LRB): TRACHEAL ESOPHOGEAL PUNCTURE WITH PLACEMENT OF  PROVOX PROSTHESIS  (N/A)  Anesthesia type: general  Patient location: PACU  Post pain: Pain level controlled  Post assessment: Patient's Cardiovascular Status Stable  Post vital signs: Reviewed and stable  Level of consciousness: sedated  Complications: No apparent anesthesia complications

## 2013-09-08 NOTE — Preoperative (Signed)
Beta Blockers   Reason not to administer Beta Blockers:Not Applicable 

## 2013-09-08 NOTE — Progress Notes (Signed)
Pt's son at bedside as well as Greer Ee  [employer] rep.

## 2013-09-08 NOTE — Anesthesia Preprocedure Evaluation (Addendum)
Anesthesia Evaluation    Reviewed: Allergy & Precautions, H&P , NPO status , Patient's Chart, lab work & pertinent test results  History of Anesthesia Complications Negative for: history of anesthetic complications  Airway       Dental  (+) Edentulous Upper, Edentulous Lower and Dental Advisory Given   Pulmonary COPDformer smoker,          Cardiovascular hypertension, Pt. on home beta blockers     Neuro/Psych  Headaches, Anxiety Depression  Neuromuscular disease    GI/Hepatic Neg liver ROS, hiatal hernia, GERD-  ,  Endo/Other  negative endocrine ROS  Renal/GU Renal InsufficiencyRenal disease     Musculoskeletal   Abdominal   Peds  Hematology   Anesthesia Other Findings   Reproductive/Obstetrics                          Anesthesia Physical Anesthesia Plan  ASA: III  Anesthesia Plan: General   Post-op Pain Management:    Induction: Intravenous  Airway Management Planned:   Additional Equipment:   Intra-op Plan:   Post-operative Plan:   Informed Consent:   Plan Discussed with: CRNA, Anesthesiologist and Surgeon  Anesthesia Plan Comments:         Anesthesia Quick Evaluation

## 2013-09-15 ENCOUNTER — Encounter (HOSPITAL_COMMUNITY): Payer: Self-pay | Admitting: Otolaryngology

## 2013-09-18 ENCOUNTER — Encounter: Payer: Self-pay | Admitting: Radiation Oncology

## 2013-09-18 DIAGNOSIS — Z923 Personal history of irradiation: Secondary | ICD-10-CM | POA: Insufficient documentation

## 2013-09-19 ENCOUNTER — Encounter: Payer: Self-pay | Admitting: Radiation Oncology

## 2013-09-19 ENCOUNTER — Ambulatory Visit
Admission: RE | Admit: 2013-09-19 | Discharge: 2013-09-19 | Disposition: A | Discharge status: Home / Self Care | Payer: Medicare Other | Source: Ambulatory Visit | Attending: Radiation Oncology | Admitting: Radiation Oncology

## 2013-09-19 VITALS — BP 154/72 | HR 71 | Temp 99.5°F | Wt 120.5 lb

## 2013-09-19 DIAGNOSIS — C320 Malignant neoplasm of glottis: Secondary | ICD-10-CM

## 2013-09-19 NOTE — Progress Notes (Signed)
CC: Dr. Jerrell Belfast, Dr. Billey Chang  Followup note:  The patient returns today approximately 10 months following completion of postoperative radiation therapy in the management of her stage IV squamous cell carcinoma of the supraglottic larynx. She continues to do well. In late September she had placement of a Provox  prosthesis for esophageal speech. She is quite excited. She is eating well and maintaining her weight. She is going to [facility] for speech therapy.  Physical examination: Alert and oriented. Filed Vitals:   09/19/13 1641  BP: 154/72  Pulse: 71  Temp: 99.5 F (37.5 C)   Head and neck examination: There is no palpable lymphadenopathy in the neck. Her stoma is clean. Oral cavity and oropharynx unremarkable to inspection. Indirect mirror examination likewise unremarkable.  Impression: Satisfactory progress with no evidence for recurrent disease. She is obviously elated about her voice prosthesis.  Plan: I believe that she will see Dr. [name] next month and she can return here for a followup visit in 3 months.

## 2013-09-19 NOTE — Progress Notes (Signed)
[name] here for fu assessment s/p radiation therapy for squamous cell carcinoma of the supraglottic larynx.    She is able to vocalize and has a covering over her stoma,.  She had a TRACHEAL ESOPHOGEAL PUNCTURE WITH PLACEMENT OF PROVOX PROSTHESIS by Dr. [name] on 09/08/13.  Has a reddened area above her stoma and has a low grade fever.  Is not on any antibiotics

## 2013-09-28 ENCOUNTER — Emergency Department (HOSPITAL_COMMUNITY): Payer: Medicare Other

## 2013-09-28 ENCOUNTER — Encounter (HOSPITAL_COMMUNITY): Payer: Self-pay | Admitting: Emergency Medicine

## 2013-09-28 ENCOUNTER — Observation Stay (HOSPITAL_COMMUNITY)
Admission: EM | Admit: 2013-09-28 | Discharge: 2013-09-28 | Disposition: A | Discharge status: Home / Self Care | Payer: Medicare Other | Attending: Emergency Medicine | Admitting: Emergency Medicine

## 2013-09-28 DIAGNOSIS — R0602 Shortness of breath: Secondary | ICD-10-CM | POA: Insufficient documentation

## 2013-09-28 DIAGNOSIS — I517 Cardiomegaly: Secondary | ICD-10-CM | POA: Insufficient documentation

## 2013-09-28 DIAGNOSIS — M542 Cervicalgia: Secondary | ICD-10-CM

## 2013-09-28 DIAGNOSIS — I700 Atherosclerosis of aorta: Secondary | ICD-10-CM | POA: Insufficient documentation

## 2013-09-28 DIAGNOSIS — E7800 Pure hypercholesterolemia, unspecified: Secondary | ICD-10-CM | POA: Insufficient documentation

## 2013-09-28 DIAGNOSIS — R1310 Dysphagia, unspecified: Secondary | ICD-10-CM

## 2013-09-28 DIAGNOSIS — K219 Gastro-esophageal reflux disease without esophagitis: Secondary | ICD-10-CM | POA: Insufficient documentation

## 2013-09-28 DIAGNOSIS — E785 Hyperlipidemia, unspecified: Secondary | ICD-10-CM | POA: Insufficient documentation

## 2013-09-28 DIAGNOSIS — Z79899 Other long term (current) drug therapy: Secondary | ICD-10-CM | POA: Insufficient documentation

## 2013-09-28 DIAGNOSIS — E869 Volume depletion, unspecified: Secondary | ICD-10-CM | POA: Insufficient documentation

## 2013-09-28 DIAGNOSIS — I10 Essential (primary) hypertension: Secondary | ICD-10-CM | POA: Insufficient documentation

## 2013-09-28 DIAGNOSIS — Z8521 Personal history of malignant neoplasm of larynx: Secondary | ICD-10-CM | POA: Insufficient documentation

## 2013-09-28 DIAGNOSIS — R072 Precordial pain: Principal | ICD-10-CM | POA: Insufficient documentation

## 2013-09-28 DIAGNOSIS — Z87891 Personal history of nicotine dependence: Secondary | ICD-10-CM | POA: Insufficient documentation

## 2013-09-28 DIAGNOSIS — R079 Chest pain, unspecified: Secondary | ICD-10-CM

## 2013-09-28 DIAGNOSIS — R4182 Altered mental status, unspecified: Secondary | ICD-10-CM | POA: Insufficient documentation

## 2013-09-28 DIAGNOSIS — R0600 Dyspnea, unspecified: Secondary | ICD-10-CM

## 2013-09-28 LAB — CBC WITH DIFFERENTIAL/PLATELET
Basophils Absolute: 0 10*3/uL (ref 0.0–0.1)
Basophils Relative: 0 % (ref 0–1)
Eosinophils Relative: 1 % (ref 0–5)
HCT: 36.4 % (ref 36.0–46.0)
Hemoglobin: 12.2 g/dL (ref 12.0–15.0)
Lymphs Abs: 0.9 10*3/uL (ref 0.7–4.0)
MCHC: 33.5 g/dL (ref 30.0–36.0)
MCV: 84.5 fL (ref 78.0–100.0)
Monocytes Absolute: 0.5 10*3/uL (ref 0.1–1.0)
Monocytes Relative: 6 % (ref 3–12)
Neutro Abs: 7.6 10*3/uL (ref 1.7–7.7)
Neutrophils Relative %: 84 % — ABNORMAL HIGH (ref 43–77)
Platelets: 290 10*3/uL (ref 150–400)
RBC: 4.31 MIL/uL (ref 3.87–5.11)
RDW: 13.2 % (ref 11.5–15.5)
WBC: 9 10*3/uL (ref 4.0–10.5)

## 2013-09-28 LAB — TROPONIN I: Troponin I: <0.3 ng/mL (ref <0.30)

## 2013-09-28 LAB — COMPREHENSIVE METABOLIC PANEL
AST: 19 U/L (ref 0–37)
Alkaline Phosphatase: 105 U/L (ref 39–117)
BUN: 41 mg/dL — ABNORMAL HIGH (ref 6–23)
CO2: 22 mEq/L (ref 19–32)
Calcium: 10.4 mg/dL (ref 8.4–10.5)
Chloride: 102 mEq/L (ref 96–112)
Creatinine, Ser: 1.56 mg/dL — ABNORMAL HIGH (ref 0.50–1.10)
GFR calc non Af Amer: 33 mL/min — ABNORMAL LOW (ref >90)
Potassium: 4.2 mEq/L (ref 3.5–5.1)
Sodium: 138 mEq/L (ref 135–145)
Total Bilirubin: 0.4 mg/dL (ref 0.3–1.2)

## 2013-09-28 MED ORDER — ASPIRIN 81 MG PO CHEW
324.0000 mg | CHEWABLE_TABLET | Freq: Once | ORAL | Status: DC
  Filled 2013-09-28: qty 4

## 2013-09-28 MED ORDER — ONDANSETRON HCL 4 MG/2ML IJ SOLN: 4.0000 mg | Freq: Three times a day (TID) | INTRAMUSCULAR | Status: DC | PRN

## 2013-09-28 MED ORDER — HYDROCODONE-ACETAMINOPHEN 5-325 MG PO TABS: 1.0000 | ORAL_TABLET | ORAL | Status: DC | PRN

## 2013-09-28 MED ORDER — HYDROCODONE-ACETAMINOPHEN 5-325 MG PO TABS
1.0000 | ORAL_TABLET | Freq: Once | ORAL | Status: AC
  Administered 2013-09-28: 1 via ORAL
  Filled 2013-09-28: qty 1

## 2013-09-28 NOTE — ED Provider Notes (Signed)
CSN: [id number]     Arrival date & time 09/28/13  1326 History   First MD Initiated Contact with Patient 09/28/13 1340     Chief Complaint  Patient presents with  . Shortness of Breath   (Consider location/radiation/quality/duration/timing/severity/associated sxs/prior Treatment) HPI Comments: Patient is Spanish-speaking and has had a total laryngectomy which makes medication difficult. She is brought in by EMS after developing diaphoresis, nausea, sweating, shortness of breath, chest and neck pain. She was standing cutting vegetables when she suddenly started feeling sweaty, nauseated and had right-sided neck pain and substernal chest pain that lasted about 15 minutes and has now resolved. She feels back to baseline now. She denies any cough, fever, weakness, numbness or tingling. She denies any bowel or bladder incontinence. She denies any headache or visual change. She states she feels back to baseline. No history of cardiac or pulmonary problems. She does have a history of anxiety but never had a reaction like this.  The history is provided by the patient and a relative. The history is limited by a language barrier.    Past Medical History  Diagnosis Date  . Hypercholesteremia     takes Pravastatin daily  . Uterine cancer   . Hiatal hernia 08/10/2012  . 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 Hypertension     takes Tribenzor and Atenolol daily  . Headache(784.0)   . GERD (gastroesophageal reflux disease)     takes Zantac daily  . Chronic back pain   . Constipation     related to pain meds  . Nausea     takes Zofran prn  . Anxiety     takes Ativan prn  . Depression   . Insomnia     takes Amitriptyline daily  . Broken ribs   . Blood transfusion without reported diagnosis 09/15/12    2 units Prbc's  . Gastrostomy in place   . History of radiation therapy 10/17/12-11/25/12    supraglottic larynx,high risk neck tumor bed 5880 cGy/28 sessions, high risk  lymph node tumor bed 5600 cGy/20 sessions, mod risk lymph node tumor bed 5040 cGy/20 sessions  . COPD (chronic obstructive pulmonary disease) 08/10/2012    denies  . Stroke 2011    denies   Past Surgical History  Procedure Laterality Date  . Abdominal surgery      r/t uterine carcinoma  . Tracheostomy tube placement  08/10/2012    Procedure: TRACHEOSTOMY;  Surgeon: Jerrell Belfast, MD;  Location: [facility];  Service: ENT;  Laterality: N/A;  . Laryngoscopy  08/10/2012    Procedure: LARYNGOSCOPY;  Surgeon: Jerrell Belfast, MD;  Location: [facility];  Service: ENT;  Laterality: N/A;  with biopsy  . Appendectomy    . Gastrostomy w/ feeding tube  13  . Laryngetomy  08/31/2012    Procedure: LARYNGECTOMY;  Surgeon: Jerrell Belfast, MD;  Location: [facility];  Service: ENT;  Laterality: N/A;  . Radical neck dissection  08/31/2012    Procedure: RADICAL NECK DISSECTION;  Surgeon: Jerrell Belfast, MD;  Location: [facility];  Service: ENT;  Laterality: Bilateral;  . Hernia repair      child  . Tracheal esophogeal puncture with repair stoma N/A 09/08/2013    Procedure: TRACHEAL ESOPHOGEAL PUNCTURE WITH PLACEMENT OF  PROVOX PROSTHESIS ;  Surgeon: Jerrell Belfast, MD;  Location: [facility];  Service: ENT;  Laterality: N/A;   No family history on file. History  Substance Use Topics  . Smoking status: Former Smoker -- 2.00 packs/day for  50 years    Types: Cigarettes    Quit date: 08/08/2012  . Smokeless tobacco: Never Used     Comment: quit 08/08/12  . Alcohol Use: No   OB History   Grav Para Term Preterm Abortions TAB SAB Ect Mult Living                 Review of Systems  Unable to perform ROS: Patient nonverbal  Respiratory: Positive for shortness of breath.     Allergies  Bacitracin  Home Medications   Current Outpatient Rx  Name  Route  Sig  Dispense  Refill  . amLODipine (NORVASC) 2.5 MG tablet   Oral   Take 2.5 mg by mouth daily. Take 1 tablet (2.5 mg total) by mouth daily.         [name] atenolol  (TENORMIN) 25 MG tablet   Oral   Take 25 mg by mouth 2 (two) times daily. Take 1 tablet (25 mg total) by mouth 2 (two) times a day as needed.         [name] FLUoxetine (PROZAC) 20 MG capsule   Per Tube   Place 20 mg into feeding tube daily. Take 1 capsule (20 mg total) by mouth daily.         [name] HYDROcodone-acetaminophen (HYCET) 7.5-325 mg/15 ml solution   Oral   Take 15 mLs by mouth 4 (four) times daily as needed for pain.   120 mL   0   . ibuprofen (ADVIL,MOTRIN) 200 MG tablet   Oral   Take 400 mg by mouth every 6 (six) hours as needed for pain.         [name] loratadine (CLARITIN) 10 MG tablet   Oral   Take 10 mg by mouth daily as needed for allergies.          [name] LORazepam (ATIVAN) 1 MG tablet   Oral   Take 1 mg by mouth daily.         . rosuvastatin (CRESTOR) 10 MG tablet   Oral   Take 10 mg by mouth daily.         . traZODone (DESYREL) 50 MG tablet   Oral   Take 50 mg by mouth daily as needed for sleep.          [name] HYDROcodone-acetaminophen (NORCO) 5-325 MG per tablet   Oral   Take 1 tablet by mouth every 4 (four) hours as needed for pain.   12 tablet   0    BP 130/78  Pulse 89  Temp(Src) 98.1 F (36.7 C) (Oral)  Resp 18  SpO2 97% Physical Exam  Constitutional: She is oriented to person, place, and time. She appears well-developed and well-nourished. No distress.  HENT:  Head: Normocephalic.  Mouth/Throat: Oropharynx is clear and moist. No oropharyngeal exudate.  Eyes: Conjunctivae and EOM are normal. Pupils are equal, round, and reactive to light.  Neck: Normal range of motion. Neck supple.  Tracheostomy stoma appears clean and dry  Cardiovascular: Normal rate and normal heart sounds.   No murmur heard. Pulmonary/Chest: Breath sounds normal. No respiratory distress.  Abdominal: Soft. There is no tenderness. There is no rebound and no guarding.  Musculoskeletal: Normal range of motion. She exhibits no edema and no tenderness.  Neurological: She is  alert and oriented to person, place, and time. No cranial nerve deficit. She exhibits normal muscle tone. Coordination normal.  CN 2-12 intact, no ataxia on finger to nose, no nystagmus, 5/5 strength throughout, no  pronator drift, Romberg negative, normal gait.   Skin: Skin is warm.    ED Course  Procedures (including critical care time) Labs Review Labs Reviewed  CBC WITH DIFFERENTIAL - Abnormal; Notable for the following:    Neutrophils Relative % 84 (*)    Lymphocytes Relative 9 (*)    All other components within normal limits  COMPREHENSIVE METABOLIC PANEL - Abnormal; Notable for the following:    Glucose, Bld 133 (*)    BUN 41 (*)    Creatinine, Ser 1.56 (*)    GFR calc non Af Amer 33 (*)    GFR calc Af Amer 39 (*)    All other components within normal limits  TROPONIN I  TROPONIN I   Imaging Review Dg Chest 2 View  09/28/2013   CLINICAL DATA:  Shortness of breath and prior smoker.  EXAM: CHEST  2 VIEW  COMPARISON:  09/06/2013  FINDINGS: Lateral view degraded by patient arm position. Midline trachea. Mild cardiomegaly with atherosclerosis in the transverse aorta. No pleural effusion or pneumothorax. Biapical pleural thickening. Clear lungs.  IMPRESSION: Cardiomegaly and atherosclerosis ; No acute findings.   Electronically Signed   By: Abigail Miyamoto M.D.   On: 09/28/2013 14:22   Ct Head Wo Contrast  09/28/2013   CLINICAL DATA:  Altered mental status, right-sided chest pain, severe right neck pain.  EXAM: CT HEAD WITHOUT CONTRAST  TECHNIQUE: Contiguous axial images were obtained from the base of the skull through the vertex without intravenous contrast.  COMPARISON:  None.  FINDINGS: No acute intracranial hemorrhage. No focal mass lesion. No CT evidence of acute infarction. No midline shift or mass effect. No hydrocephalus. Basilar cisterns are patent. There is minimal subcortical white matter hypoattenuation.  Paranasal sinuses and mastoid air cells are clear.  IMPRESSION: No acute  intracranial findings.  Minimal microvascular disease.   Electronically Signed   By: Suzy Bouchard M.D.   On: 09/28/2013 16:01    EKG Interpretation     Ventricular Rate:  71 PR Interval:  106 QRS Duration: 81 QT Interval:  456 QTC Calculation: 496 R Axis:   51 Text Interpretation:  Artifact Normal sinus rhythm No significant change was found            MDM   1. Chest pain   2. Dyspnea   3. Neck pain on right side   4. Dysphagia    Acute onset of diaphoresis, nausea, neck pain, chest pain shortness of breath. Resolved after 15 minutes now feels back to baseline.  EKG without acute changes. Chest x-ray negative.  patient has history of laryngectomy and speaks only [place]. History obtained with translator and family members. Translator [phone]  Troponin negative.  Planned to pursue CTA of neck to evaluate vasculature but patient's Cr 1.56 which precludes enough contrast administration. Head and neck pain are now resolved. No neuro or pulse deficits, so vertebral or aortic dissection seems less likely.  Episode could be anxiety related but cannot rule out cardiac etiology. Patient was initially willing to stay for evaluation but then decided that she wanted to go home. I discussed with the patient through the translator that a cardiac etiology of her pain has not be ruled out and she will be leaving against medical advice. She understands the risks up to an including heart attack and death and has capacity to make decisions.  Dr. Verlon Au discussed this with patient as well but she continued to refuse admission. Incidentally, she reported to Dr. Verlon Au that  she has had 2 days of dysphagia and has an appointment with Dr. [name] tomorrow. Her episode may in fact be related to dysphagia or anxiety but a cardiac etiology cannot be completely ruled out and she will leave AMA.  Ezequiel Essex, MD 09/28/13 1750

## 2013-09-28 NOTE — ED Notes (Addendum)
Hospitalist at bedside. Pt requests something to eat. Pt given water and graham crackers per hospitalist okay. Lung sounds auscultated after sip of water, all quadrants clear.

## 2013-09-28 NOTE — ED Notes (Signed)
Per EMS pt was at her place of business, cutting vegetables when suddenly developed severe right sided neck pain. Upon their arrival family reported pt was sweating profusely and c/o right sided chest pain as well. Pt was given 324 mg aspirin and 4 mg of zofran IV for nausea. Pt is only speaking Spanish and her son is at bedside to translate. Pt has hx of throat cancer and has stoma, which appear clean and dry.

## 2013-09-28 NOTE — ED Notes (Signed)
Patient transported to X-ray 

## 2013-09-28 NOTE — ED Notes (Signed)
Bed: WA18 Expected date:  Expected time:  Means of arrival:  Comments: EMS-SOB 

## 2013-09-28 NOTE — ED Notes (Addendum)
Per pt's son, pt was fixing a salad and became sweaty, pain in back of neck, SOB, and central chest pain. Son reports all symptoms subsiding within 15 minutes. Son reports pt having anxiety. Pt denies SOB, chest pain at present time. Pt denies pain at present time.

## 2013-09-28 NOTE — ED Provider Notes (Signed)
I was asked by Dr. Verlon Au, to discharge the patient. He has seen her, and written a consultation note. He had offer her admission, but she wanted to leave. She was initially seen by Dr. Wyvonnia Dusky, who had consulted the hospitalist to admit her for evaluation of chest pain, neck pain and difficulty swallowing.  I had a brief discussion with the patient and family members at 16:55. At this time they stated that she had run out of her Norco 3 days ago. She was mostly troubled by neck pain and that caused her to come to the hospital today. The neck pain is worst when attempting to swallow. She was having neck pain today,  Then she developed some chest discomfort that was transient. She is not having chest pain, now. At this time, she is able to drink liquids easily without choking or vomiting. She has been able to eat some crackers here. She had a tracheal, esophageal device placed on 09/08/13, which allows her to vocalize. She has an appointment, tomorrow with her ENT physician at 2:30 PM. Vital signs have been normal. In emergency department, for the last hour. She had been hypertensive, earlier.  Assessment: Patient's airway and swallowing mechanism,  appears intact at the time I saw her.  Patient and her family members are requesting a dose of Norco in the emergency department, and a short term prescription to go home with. They plan on seeing her ENT doctor tomorrow.  Plan: Discharged as AMA. Prescription for Norco #12.  Richarda Blade, MD 09/28/13 [phone]

## 2013-09-28 NOTE — ED Notes (Signed)
Pt returned for xray. Phlebotomy at bedside.

## 2013-09-28 NOTE — Consult Note (Signed)
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 She states that this happened all of a sudden and notes that this is in the background of having a 2 day history of increasing dysphagia to both solids and liquids and even saliva and difficulty swallowing. Initially it was thought that she might have had either vertebral artery dissection vs. cardiogenic chest pain It was noted that she has a 50-pack-year smoking history and hyperlipidemia. She is also about 76 which would put her heart score in between 3-4 range. It was noted as well in the emergency room that she had some moderate volume depletion with BUN/creatinine 41/1.56 over and above her baseline 29/1.18. Her LFTs were normal. A CT of the head was performed which showed no acute intracranial findings Chest x-ray showed cardiomegaly and atherosclerosis nothing acute  She was referred for admission and Hospital service was more than willing to admit her however patient felt that she would be best served by going home, going to see Dr. [name] at 2:45 PM 10/17 [name] has this appointment from previously] and did not want to stay in the hospital.  I fully supported patient coming into the hospital for IV fluids and at least chest pain rule out as history did not seem exceedingly clear [she is on multiple anti-anxiety medications however and this may be secondary to some psych overlay] I did mention to her  that if she has these issues again in terms of chest pain or shortness of breath, she is welcome to come back however we will in no way be anyway liable or responsible for her care once she leaves the emergency room. I did discuss my findings and the care of her case with Dr. Wyvonnia Dusky of the emergency room and he understands the standpoint that the patient was taken to I did also try to call Dr. [name] to make him aware of patient's admission however he apparently is out of town today and I will forward this note to him.  Review of Systems:  Denies weakness unable side body blurred vision double vision falls to one side, paresthesia, diarrhea, [had some diarrhea stops and to postop antibiotics], cough, fever, chills, dysuria, vomiting although has felt nauseous over the past couple of days. No rash, no sick contacts, see above  Past Medical History  Diagnosis Date  . Hypercholesteremia     takes Pravastatin daily  . Uterine cancer   . Hiatal hernia 08/10/2012  . 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 Hypertension     takes Tribenzor and Atenolol daily  . Headache(784.0)   . GERD (gastroesophageal reflux disease)     takes Zantac daily  . Chronic back pain   . Constipation     related to pain meds  . Nausea     takes Zofran prn  . Anxiety     takes Ativan prn  . Depression   . Insomnia     takes Amitriptyline daily  . Broken ribs   . Blood transfusion  without reported diagnosis 09/15/12    2 units Prbc's  . Gastrostomy in place   . History of radiation therapy 10/17/12-11/25/12    supraglottic larynx,high risk neck tumor bed 5880 cGy/28 sessions, high risk lymph node tumor bed 5600 cGy/20 sessions, mod risk lymph node tumor bed 5040 cGy/20 sessions  . COPD (chronic obstructive pulmonary disease) 08/10/2012    denies  . Stroke 2011    denies   Past Surgical History  Procedure Laterality Date  . Abdominal surgery      r/t uterine carcinoma  .  Tracheostomy tube placement  08/10/2012    Procedure: TRACHEOSTOMY;  Surgeon: Jerrell Belfast, MD;  Location: [facility];  Service: ENT;  Laterality: N/A;  . Laryngoscopy  08/10/2012    Procedure: LARYNGOSCOPY;  Surgeon: Jerrell Belfast, MD;  Location: [facility];  Service: ENT;  Laterality: N/A;  with biopsy  . Appendectomy    . Gastrostomy w/ feeding tube  13  . Laryngetomy  08/31/2012    Procedure: LARYNGECTOMY;  Surgeon: Jerrell Belfast, MD;  Location: [place];  Service: ENT;  Laterality: N/A;  . Radical neck dissection  08/31/2012    Procedure: RADICAL NECK DISSECTION;  Surgeon: Jerrell Belfast, MD;  Location: [place];  Service: ENT;  Laterality: Bilateral;  . Hernia repair      child  . Tracheal esophogeal puncture with repair stoma N/A 09/08/2013    Procedure: TRACHEAL ESOPHOGEAL PUNCTURE WITH PLACEMENT OF  PROVOX PROSTHESIS ;  Surgeon: Jerrell Belfast, MD;  Location: [place];  Service: ENT;  Laterality: N/A;   Social History:  reports that she quit smoking about 13 months ago. Her smoking use included Cigarettes. She has a 100 pack-year smoking history. She has never used smokeless tobacco. She reports that she does not drink alcohol or use illicit drugs.  Allergies  Allergen Reactions  . Bacitracin Rash   No family history on file.  Prior to Admission medications   Medication Sig Start Date End Date Taking? Authorizing Provider  amLODipine (NORVASC) 2.5 MG tablet Take 2.5 mg by mouth daily. Take 1 tablet (2.5 mg total) by mouth daily. 01/12/13  Yes Historical Provider, MD  atenolol (TENORMIN) 25 MG tablet Take 25 mg by mouth 2 (two) times daily. Take 1 tablet (25 mg total) by mouth 2 (two) times a day as needed. 01/13/13  Yes Historical Provider, MD  FLUoxetine (PROZAC) 20 MG capsule Place 20 mg into feeding tube daily. Take 1 capsule (20 mg total) by mouth daily. 12/26/12  Yes Historical Provider, MD  HYDROcodone-acetaminophen (HYCET) 7.5-325 mg/15 ml solution Take 15 mLs by mouth 4 (four) times  daily as needed for pain. 09/08/13  Yes Jerrell Belfast, MD  ibuprofen (ADVIL,MOTRIN) 200 MG tablet Take 400 mg by mouth every 6 (six) hours as needed for pain.   Yes Historical Provider, MD  loratadine (CLARITIN) 10 MG tablet Take 10 mg by mouth daily as needed for allergies.    Yes Historical Provider, MD  LORazepam (ATIVAN) 1 MG tablet Take 1 mg by mouth daily. 09/14/12  Yes Jerrell Belfast, MD  rosuvastatin (CRESTOR) 10 MG tablet Take 10 mg by mouth daily.   Yes Historical Provider, MD  traZODone (DESYREL) 50 MG tablet Take 50 mg by mouth daily as needed for sleep.    Yes Historical Provider, MD   Physical Exam: Blood pressure 129/67, pulse 75, temperature 99.2 F (37.3 C), temperature source Oral, resp. rate 18, SpO2 98.00%. Filed Vitals:   09/28/13 1606  BP: 129/67  Pulse:  75  Temp:   Resp:      General:  Alert pleasant oriented  Eyes: EOMI, NCAT  ENT: Tracheostomy present looks clean, dentures noted at the top cannot assess the rest of her throat given Mallampati score 3  Neck: C. above  Cardiovascular: S1-S2 no murmur rub or gallop  Respiratory: Clinically clear  Abdomen: Soft nontender nondistended  Skin: No lower extremity edema  Musculoskeletal: Range of motion intact  Psychiatric: Euthymic  Neurologic: Grossly intact moving all 4 limbs equally to our 5/5 bilaterally, sensation intact, reflexes 2/3 in all major dependent  Labs on Admission:  Basic Metabolic Panel:  Recent Labs Lab 09/28/13 1423  NA 138  K 4.2  CL 102  CO2 22  GLUCOSE 133*  BUN 41*  CREATININE 1.56*  CALCIUM 10.4   Liver Function Tests:  Recent Labs Lab 09/28/13 1423  AST 19  ALT 13  ALKPHOS 105  BILITOT 0.4  PROT 7.3  ALBUMIN 4.1   No results found for this basename: LIPASE, AMYLASE,  in the last 168 hours No results found for this basename: AMMONIA,  in the last 168 hours CBC:  Recent Labs Lab 09/28/13 1423  WBC 9.0  NEUTROABS 7.6  HGB 12.2  HCT 36.4  MCV 84.5   PLT 290   Cardiac Enzymes:  Recent Labs Lab 09/28/13 1423  TROPONINI <0.30   BNP: No components found with this basename: POCBNP,  CBG: No results found for this basename: GLUCAP,  in the last 168 hours  Radiological Exams on Admission: Dg Chest 2 View  09/28/2013   CLINICAL DATA:  Shortness of breath and prior smoker.  EXAM: CHEST  2 VIEW  COMPARISON:  09/06/2013  FINDINGS: Lateral view degraded by patient arm position. Midline trachea. Mild cardiomegaly with atherosclerosis in the transverse aorta. No pleural effusion or pneumothorax. Biapical pleural thickening. Clear lungs.  IMPRESSION: Cardiomegaly and atherosclerosis ; No acute findings.   Electronically Signed   By: Abigail Miyamoto M.D.   On: 09/28/2013 14:22   Ct Head Wo Contrast  09/28/2013   CLINICAL DATA:  Altered mental status, right-sided chest pain, severe right neck pain.  EXAM: CT HEAD WITHOUT CONTRAST  TECHNIQUE: Contiguous axial images were obtained from the base of the skull through the vertex without intravenous contrast.  COMPARISON:  None.  FINDINGS: No acute intracranial hemorrhage. No focal mass lesion. No CT evidence of acute infarction. No midline shift or mass effect. No hydrocephalus. Basilar cisterns are patent. There is minimal subcortical white matter hypoattenuation.  Paranasal sinuses and mastoid air cells are clear.  IMPRESSION: No acute intracranial findings.  Minimal microvascular disease.   Electronically Signed   By: Suzy Bouchard M.D.   On: 09/28/2013 16:01    EKG: Independently reviewed.   Time spent: [place], [facility] Pager [phone]  If 7PM-7AM, please contact night-coverage [email or web address] Password [email or web address] 09/28/2013, 5:04 PM

## 2013-12-22 ENCOUNTER — Telehealth: Payer: Self-pay | Admitting: *Deleted

## 2013-12-22 NOTE — Telephone Encounter (Signed)
Received message from Cindee Lame, pt's daughter-in-law re: pt has been having pain in the right side of her neck and wants to see Dr Valere Dross. Per Dr Charlton Amor note dated 09/19/13 pt was to return to radiation oncology to see Dr Valere Dross for 3 month FU. She has no appointment at this time. Ms Ursula Alert states pt went out of the country on 11/06/13 to see family. She developed pain in her right neck. Ms Ursula Alert states pt went to dr and was told "she has calcifications in the healing in her neck". She is due to return to the [place] on 12/26/13. Ms Ursula Alert would like pt to be seen next week for FU. Advised her Dr Valere Dross may be able to see pt on Thurs, 12/28/13. Requested she ask pt if she can bring any information or scan reports she has had done. Ms Ursula Alert states she will call pt today and ask if she can bring information.  This Probation officer will be out of this office next week. Will route this information to Dr Valere Dross, rad onc RNs and Verdell Carmine, medical secretary.

## 2013-12-25 NOTE — Telephone Encounter (Signed)
I can try to see this week or next week. RM

## 2014-01-10 ENCOUNTER — Ambulatory Visit
Admission: RE | Admit: 2014-01-10 | Discharge: 2014-01-10 | Disposition: A | Discharge status: Home / Self Care | Payer: Medicare Other | Source: Ambulatory Visit | Attending: Radiation Oncology | Admitting: Radiation Oncology

## 2014-01-10 VITALS — BP 113/51 | HR 74 | Temp 98.6°F | Wt 116.9 lb

## 2014-01-10 DIAGNOSIS — C320 Malignant neoplasm of glottis: Secondary | ICD-10-CM

## 2014-01-10 NOTE — Progress Notes (Signed)
Patient here for routine follow up completion of radiation for squamous cell cancer of supraglottic larynx October 2013.Denies pain.On antibiotic therapy for diagnosis of pneumonia 2 weeks ago but does not know name of antibiotic.Seen by Dr.Shoemaker on 01/04/14.Appetite diminished.Stoma is clean and dry without any signs of infection.Spanish interpreter Almyra Free is here.Continues on osmolite daily over 8 hour.

## 2014-01-10 NOTE — Progress Notes (Signed)
CC: Dr. Jerrell Belfast, Dr. Billey Chang  The patient returns today approximately 13 months following completion of postoperative radiation therapy in the management of her stage IV squamous cell carcinoma of the supraglottic larynx. She was seen by Dr. [name] one week ago. She is on antibiotics for recent bronchitis and suspected pneumonia. She states that her appetite is diminished, and I note that she has lost 4 pounds over the past 3 months. She is concerned about soft tissue changes adjacent to her stoma. She plans to have her voice prosthesis replaced at [facility] in the near future. She will see Dr. [name] back in one month.  Physical examination: Alert and oriented. She looks well. Wt Readings from Last 3 Encounters:  01/10/14 116 lb 14.4 oz (53.025 kg)  09/19/13 120 lb 8 oz (54.658 kg)  09/06/13 116 lb 1.6 oz (52.663 kg)   Temp Readings from Last 3 Encounters:  01/10/14 98.6 F (37 C)   09/28/13 98.1 F (36.7 C) Oral  09/19/13 99.5 F (37.5 C)    BP Readings from Last 3 Encounters:  01/10/14 113/51  09/28/13 130/78  09/19/13 154/72   Pulse Readings from Last 3 Encounters:  01/10/14 74  09/28/13 89  09/19/13 71   Head and neck examination: The neck is normal to inspection and palpation except for slightly ill-defined fullness at 10:00 just superior and to the right of her stoma. I do not feel a discrete mass. The stoma is clean and prosthesis is visible. There is no palpable adenopathy in the neck. Oral cavity and oropharynx are unremarkable to inspection. Indirect mirror examination unremarkable.  Impression: Satisfactory progress. I suspect that her diminished appetite and weight loss may be from her recent antibiotics.  Plan: Followup visit with Dr. [name] one month, and followup back here in 2 months to assess her questionable right peristomal mass.

## 2014-02-02 ENCOUNTER — Other Ambulatory Visit (HOSPITAL_COMMUNITY): Payer: Self-pay | Admitting: Otolaryngology

## 2014-02-02 DIAGNOSIS — R4789 Other speech disturbances: Secondary | ICD-10-CM

## 2014-02-07 ENCOUNTER — Ambulatory Visit (HOSPITAL_COMMUNITY): Admission: RE | Admit: 2014-02-07 | Payer: Self-pay | Source: Ambulatory Visit

## 2014-02-20 ENCOUNTER — Other Ambulatory Visit: Payer: Self-pay | Admitting: Otolaryngology

## 2014-02-20 ENCOUNTER — Other Ambulatory Visit (HOSPITAL_COMMUNITY): Payer: Self-pay | Admitting: Otolaryngology

## 2014-02-20 DIAGNOSIS — C329 Malignant neoplasm of larynx, unspecified: Secondary | ICD-10-CM

## 2014-02-28 ENCOUNTER — Ambulatory Visit (HOSPITAL_COMMUNITY)
Admission: RE | Admit: 2014-02-28 | Discharge: 2014-02-28 | Disposition: A | Discharge status: Home / Self Care | Payer: Medicare Other | Source: Ambulatory Visit | Attending: Otolaryngology | Admitting: Otolaryngology

## 2014-02-28 ENCOUNTER — Encounter (HOSPITAL_COMMUNITY): Payer: Self-pay

## 2014-02-28 DIAGNOSIS — R6889 Other general symptoms and signs: Secondary | ICD-10-CM | POA: Insufficient documentation

## 2014-02-28 DIAGNOSIS — C329 Malignant neoplasm of larynx, unspecified: Secondary | ICD-10-CM

## 2014-02-28 DIAGNOSIS — C779 Secondary and unspecified malignant neoplasm of lymph node, unspecified: Secondary | ICD-10-CM | POA: Insufficient documentation

## 2014-02-28 DIAGNOSIS — Z9089 Acquired absence of other organs: Secondary | ICD-10-CM | POA: Insufficient documentation

## 2014-02-28 DIAGNOSIS — R4789 Other speech disturbances: Secondary | ICD-10-CM

## 2014-02-28 DIAGNOSIS — Z930 Tracheostomy status: Secondary | ICD-10-CM | POA: Insufficient documentation

## 2014-02-28 DIAGNOSIS — R1310 Dysphagia, unspecified: Secondary | ICD-10-CM | POA: Insufficient documentation

## 2014-02-28 DIAGNOSIS — Z923 Personal history of irradiation: Secondary | ICD-10-CM | POA: Insufficient documentation

## 2014-02-28 LAB — GLUCOSE, CAPILLARY: Glucose-Capillary: 93 mg/dL (ref 70–99)

## 2014-02-28 MED ORDER — FLUDEOXYGLUCOSE F - 18 (FDG) INJECTION
7.1000 | Freq: Once | INTRAVENOUS | Status: AC | PRN
  Administered 2014-02-28: 7.1 via INTRAVENOUS

## 2014-03-01 ENCOUNTER — Emergency Department (HOSPITAL_COMMUNITY)
Admission: RE | Admit: 2014-03-01 | Discharge: 2014-03-01 | Disposition: A | Discharge status: Home / Self Care | Payer: Medicare Other | Source: Ambulatory Visit | Attending: Otolaryngology | Admitting: Otolaryngology

## 2014-03-01 ENCOUNTER — Emergency Department (HOSPITAL_COMMUNITY)
Admission: EM | Admit: 2014-03-01 | Discharge: 2014-03-01 | Discharge status: Against Medical Advice | Payer: Medicare Other | Attending: Emergency Medicine | Admitting: Emergency Medicine

## 2014-03-01 DIAGNOSIS — Z008 Encounter for other general examination: Secondary | ICD-10-CM | POA: Insufficient documentation

## 2014-03-01 DIAGNOSIS — G8929 Other chronic pain: Secondary | ICD-10-CM | POA: Insufficient documentation

## 2014-03-01 DIAGNOSIS — J4489 Other specified chronic obstructive pulmonary disease: Secondary | ICD-10-CM | POA: Insufficient documentation

## 2014-03-01 DIAGNOSIS — Z930 Tracheostomy status: Secondary | ICD-10-CM | POA: Insufficient documentation

## 2014-03-01 DIAGNOSIS — I10 Essential (primary) hypertension: Secondary | ICD-10-CM | POA: Insufficient documentation

## 2014-03-01 DIAGNOSIS — C7889 Secondary malignant neoplasm of other digestive organs: Secondary | ICD-10-CM | POA: Insufficient documentation

## 2014-03-01 DIAGNOSIS — J449 Chronic obstructive pulmonary disease, unspecified: Secondary | ICD-10-CM | POA: Insufficient documentation

## 2014-03-01 NOTE — ED Notes (Signed)
Pt speaks little to no English and was accidentally checked into ED.  Pt was only at [facility] for radiology appointment.    ED staff walked Pt to Radiology.

## 2014-03-01 NOTE — Progress Notes (Signed)
RT assessed PT- PT does not speak [place] (RN aware). PT has stoma (WNL at this time on exterior), Sp02 on RA 99%, HR 89, RR 16, BS clear. RT advised RN to discuss issue with MD - RT available.

## 2014-03-01 NOTE — ED Notes (Signed)
Patient with tracheostomy s/t esophageal cancer reports to ED for routine check up of her esophagus per order from her PCP. Patient only speaks [place].

## 2014-03-01 NOTE — ED Provider Notes (Addendum)
MSE was initiated and I personally evaluated the patient and placed orders (if any) at  10:34 AM on March 01, 2014.  Patient accidentally checked into the emergency room. She only speaks Spanish and has a tracheostomy from a remote history of esophageal cancer.  The patient saw Dr. [name] recently. She had an order placed for radiology study. Patient had an appointment this morning at 9:30. She was supposed to check in to the radiology department but accidentally came to the emergency room. Staff at registration misunderstood her hand thought she was asking to be evaluated for an issue with her throat.  I used the [place] language interpreter. Patient told me she did have an appointment at 9:30. She does not want to be seen by an emergency room physician. She does not have any acute complaints.  Heart RRR.  Lungs CTA  I spoke with the radiology department and they do have an appointment scheduled for her. We will escort the patient over to the radiology department. I spoke with the charge nurse to see if we can have this visit removed because the patient does not need to be charged for an emergent evaluation.  Kathalene Frames, MD 03/01/14 1037  Corrected dragon dictation error.  Kathalene Frames, MD 03/05/14 [phone]

## 2014-03-06 ENCOUNTER — Encounter: Payer: Self-pay | Admitting: Radiation Oncology

## 2014-03-06 ENCOUNTER — Ambulatory Visit
Admission: RE | Admit: 2014-03-06 | Discharge: 2014-03-06 | Disposition: A | Discharge status: Home / Self Care | Payer: Medicare Other | Source: Ambulatory Visit | Attending: Radiation Oncology | Admitting: Radiation Oncology

## 2014-03-06 VITALS — BP 154/80 | HR 68 | Temp 98.3°F | Resp 20 | Wt 115.5 lb

## 2014-03-06 DIAGNOSIS — C320 Malignant neoplasm of glottis: Secondary | ICD-10-CM

## 2014-03-06 NOTE — Progress Notes (Signed)
CC: Dr. Jerrell Belfast, Dr. Billey Chang  Followup note:  [name] returns today approximately 15 months following completion of postoperative radiation therapy in the management of her stage IV squamous cell carcinoma of the supraglottic larynx. She was recently seen by Dr. [name] who is concerned about her right parasternal soft tissue changes. A PET scan on March 18 was without evidence for recurrent disease. A barium swallow on March 19 showed cervical anatomic distortion, felt to be postoperative change. She states that the right parasternal region is slightly tender to palpation. She has not yet have an appointment to see Dr. [name] for a followup visit.  Physical examination: Alert and oriented. Filed Vitals:   03/06/14 1045  BP: 154/80  Pulse: 68  Temp: 98.3 F (36.8 C)  Resp: 20   Head and neck examination: This stoma site is clean. There is no palpable adenopathy in the neck. There is a 2 cm area of fullness along the right stomal region but there is no discrete mass. Oral cavity and oropharynx unremarkable to inspection. Indirect mirror examination is difficult, but unremarkable.  Impression: Satisfactory progress. I suspect that the right stomal fullness is secondary to surgery radiation changes. She has less edema, and thus I feel that this area of in duration/fullness is more prominent. Through her daughter-in-law/interpreter she is was know if there is any change in the size or consistency of the right stomal fullness.  Plan: I feel that she can see Dr. [name] in 2 months and return to see me for a followup visit in 4 months.

## 2014-03-06 NOTE — Progress Notes (Signed)
Pt's daughter is present and will serve as interpreter. Patient signed waiver as such. Pt denies pain, fatigue. She has some difficulty eating meats, appetite comes and goes. She reports that her past peg tube feeding site "is still sore". Site is clean, dry, intact with no signs of redness, warmth.  She also states her trach stoma "gets sore when she talks". She places her finger over her stoma to talk. She had speech implement placed several months ago but states she did not tolerate it due to pain, discomfort. Pt 's stoma site clean, dry with no signs of redness. Pt states the area to the right of her stoma feels "like a boil and is firmer than in Jan".

## 2014-04-26 ENCOUNTER — Other Ambulatory Visit: Payer: Self-pay | Admitting: Otolaryngology

## 2014-04-26 DIAGNOSIS — R1310 Dysphagia, unspecified: Secondary | ICD-10-CM

## 2014-04-26 DIAGNOSIS — Z8521 Personal history of malignant neoplasm of larynx: Secondary | ICD-10-CM

## 2014-04-30 ENCOUNTER — Ambulatory Visit
Admission: RE | Admit: 2014-04-30 | Discharge: 2014-04-30 | Disposition: A | Discharge status: Home / Self Care | Payer: Medicare Other | Source: Ambulatory Visit | Attending: Otolaryngology | Admitting: Otolaryngology

## 2014-04-30 DIAGNOSIS — R1310 Dysphagia, unspecified: Secondary | ICD-10-CM

## 2014-04-30 DIAGNOSIS — Z8521 Personal history of malignant neoplasm of larynx: Secondary | ICD-10-CM

## 2014-05-18 ENCOUNTER — Encounter (HOSPITAL_COMMUNITY): Payer: Self-pay | Admitting: Pharmacist

## 2014-05-21 ENCOUNTER — Encounter (HOSPITAL_COMMUNITY): Payer: Self-pay | Admitting: *Deleted

## 2014-05-21 NOTE — Progress Notes (Signed)
[name] , patients daughter- in -law called asking about patients surgery.  "I always translate for patient"  I noted in notes that [name] has interpreted for patient in Drs offices.

## 2014-05-22 ENCOUNTER — Ambulatory Visit (HOSPITAL_COMMUNITY): Payer: Medicare Other | Admitting: Anesthesiology

## 2014-05-22 ENCOUNTER — Ambulatory Visit (HOSPITAL_COMMUNITY)
Admission: RE | Admit: 2014-05-22 | Discharge: 2014-05-22 | Disposition: A | Discharge status: Home / Self Care | Payer: Medicare Other | Source: Ambulatory Visit | Attending: Otolaryngology | Admitting: Otolaryngology

## 2014-05-22 ENCOUNTER — Encounter (HOSPITAL_COMMUNITY): Payer: Self-pay | Admitting: *Deleted

## 2014-05-22 ENCOUNTER — Encounter (HOSPITAL_COMMUNITY)
Admission: RE | Disposition: A | Discharge status: Home / Self Care | Payer: Self-pay | Source: Ambulatory Visit | Attending: Otolaryngology

## 2014-05-22 ENCOUNTER — Encounter (HOSPITAL_COMMUNITY): Payer: Medicare Other | Admitting: Anesthesiology

## 2014-05-22 DIAGNOSIS — F3289 Other specified depressive episodes: Secondary | ICD-10-CM | POA: Insufficient documentation

## 2014-05-22 DIAGNOSIS — Z923 Personal history of irradiation: Secondary | ICD-10-CM | POA: Insufficient documentation

## 2014-05-22 DIAGNOSIS — J449 Chronic obstructive pulmonary disease, unspecified: Secondary | ICD-10-CM | POA: Insufficient documentation

## 2014-05-22 DIAGNOSIS — K5900 Constipation, unspecified: Secondary | ICD-10-CM | POA: Insufficient documentation

## 2014-05-22 DIAGNOSIS — G4700 Insomnia, unspecified: Secondary | ICD-10-CM | POA: Insufficient documentation

## 2014-05-22 DIAGNOSIS — Z8521 Personal history of malignant neoplasm of larynx: Secondary | ICD-10-CM | POA: Insufficient documentation

## 2014-05-22 DIAGNOSIS — K449 Diaphragmatic hernia without obstruction or gangrene: Secondary | ICD-10-CM | POA: Insufficient documentation

## 2014-05-22 DIAGNOSIS — M549 Dorsalgia, unspecified: Secondary | ICD-10-CM | POA: Insufficient documentation

## 2014-05-22 DIAGNOSIS — Z9221 Personal history of antineoplastic chemotherapy: Secondary | ICD-10-CM | POA: Insufficient documentation

## 2014-05-22 DIAGNOSIS — Z79899 Other long term (current) drug therapy: Secondary | ICD-10-CM | POA: Insufficient documentation

## 2014-05-22 DIAGNOSIS — F329 Major depressive disorder, single episode, unspecified: Secondary | ICD-10-CM | POA: Insufficient documentation

## 2014-05-22 DIAGNOSIS — E7800 Pure hypercholesterolemia, unspecified: Secondary | ICD-10-CM | POA: Insufficient documentation

## 2014-05-22 DIAGNOSIS — K219 Gastro-esophageal reflux disease without esophagitis: Secondary | ICD-10-CM | POA: Insufficient documentation

## 2014-05-22 DIAGNOSIS — I10 Essential (primary) hypertension: Secondary | ICD-10-CM | POA: Insufficient documentation

## 2014-05-22 DIAGNOSIS — F411 Generalized anxiety disorder: Secondary | ICD-10-CM | POA: Insufficient documentation

## 2014-05-22 DIAGNOSIS — K222 Esophageal obstruction: Secondary | ICD-10-CM | POA: Insufficient documentation

## 2014-05-22 DIAGNOSIS — G8929 Other chronic pain: Secondary | ICD-10-CM | POA: Insufficient documentation

## 2014-05-22 DIAGNOSIS — R1310 Dysphagia, unspecified: Secondary | ICD-10-CM | POA: Insufficient documentation

## 2014-05-22 DIAGNOSIS — R1314 Dysphagia, pharyngoesophageal phase: Secondary | ICD-10-CM | POA: Diagnosis present

## 2014-05-22 DIAGNOSIS — J4489 Other specified chronic obstructive pulmonary disease: Secondary | ICD-10-CM | POA: Insufficient documentation

## 2014-05-22 DIAGNOSIS — Z8542 Personal history of malignant neoplasm of other parts of uterus: Secondary | ICD-10-CM | POA: Insufficient documentation

## 2014-05-22 DIAGNOSIS — F17200 Nicotine dependence, unspecified, uncomplicated: Secondary | ICD-10-CM | POA: Insufficient documentation

## 2014-05-22 HISTORY — PX: DIRECT LARYNGOSCOPY: SHX5326

## 2014-05-22 HISTORY — DX: Pneumonia, unspecified organism: J18.9

## 2014-05-22 LAB — BASIC METABOLIC PANEL
BUN: 34 mg/dL — AB (ref 6–23)
CO2: 21 mEq/L (ref 19–32)
Calcium: 10 mg/dL (ref 8.4–10.5)
Chloride: 103 mEq/L (ref 96–112)
Creatinine, Ser: 1.15 mg/dL — ABNORMAL HIGH (ref 0.50–1.10)
GFR calc Af Amer: 55 mL/min — ABNORMAL LOW (ref >90)
GFR calc non Af Amer: 48 mL/min — ABNORMAL LOW (ref >90)
GLUCOSE: 99 mg/dL (ref 70–99)
Potassium: 4.6 mEq/L (ref 3.7–5.3)
Sodium: 140 mEq/L (ref 137–147)

## 2014-05-22 LAB — CBC
HCT: 34.4 % — ABNORMAL LOW (ref 36.0–46.0)
Hemoglobin: 11.2 g/dL — ABNORMAL LOW (ref 12.0–15.0)
MCH: 27.8 pg (ref 26.0–34.0)
MCHC: 32.6 g/dL (ref 30.0–36.0)
MCV: 85.4 fL (ref 78.0–100.0)
Platelets: 209 10*3/uL (ref 150–400)
RBC: 4.03 MIL/uL (ref 3.87–5.11)
RDW: 13.4 % (ref 11.5–15.5)
WBC: 5.4 10*3/uL (ref 4.0–10.5)

## 2014-05-22 SURGERY — LARYNGOSCOPY, DIRECT
Anesthesia: General | Site: Esophagus

## 2014-05-22 MED ORDER — ARTIFICIAL TEARS OP OINT
TOPICAL_OINTMENT | OPHTHALMIC | Status: DC | PRN
  Administered 2014-05-22: 1 via OPHTHALMIC

## 2014-05-22 MED ORDER — CEFAZOLIN SODIUM-DEXTROSE 2-3 GM-% IV SOLR
2.0000 g | Freq: Three times a day (TID) | INTRAVENOUS | Status: DC
  Administered 2014-05-22: 2 g via INTRAVENOUS

## 2014-05-22 MED ORDER — OXYCODONE HCL 5 MG PO TABS: 5.0000 mg | ORAL_TABLET | Freq: Once | ORAL | Status: DC | PRN

## 2014-05-22 MED ORDER — MIDAZOLAM HCL 5 MG/5ML IJ SOLN
INTRAMUSCULAR | Status: DC | PRN
  Administered 2014-05-22: 1 mg via INTRAVENOUS

## 2014-05-22 MED ORDER — LIDOCAINE HCL (CARDIAC) 20 MG/ML IV SOLN
INTRAVENOUS | Status: DC | PRN
  Administered 2014-05-22: 100 mg via INTRAVENOUS

## 2014-05-22 MED ORDER — ROCURONIUM BROMIDE 50 MG/5ML IV SOLN
INTRAVENOUS | Status: AC
  Filled 2014-05-22: qty 1

## 2014-05-22 MED ORDER — MEPERIDINE HCL 25 MG/ML IJ SOLN: 6.2500 mg | INTRAMUSCULAR | Status: DC | PRN

## 2014-05-22 MED ORDER — NEOSTIGMINE METHYLSULFATE 10 MG/10ML IV SOLN
INTRAVENOUS | Status: AC
  Filled 2014-05-22: qty 1

## 2014-05-22 MED ORDER — ONDANSETRON HCL 4 MG/2ML IJ SOLN
INTRAMUSCULAR | Status: AC
  Filled 2014-05-22: qty 2

## 2014-05-22 MED ORDER — PROPOFOL 10 MG/ML IV BOLUS
INTRAVENOUS | Status: DC | PRN
  Administered 2014-05-22: 150 mg via INTRAVENOUS

## 2014-05-22 MED ORDER — GLYCOPYRROLATE 0.2 MG/ML IJ SOLN
INTRAMUSCULAR | Status: AC
  Filled 2014-05-22: qty 2

## 2014-05-22 MED ORDER — FENTANYL CITRATE 0.05 MG/ML IJ SOLN
INTRAMUSCULAR | Status: DC | PRN
Start: 2014-05-22 — End: 2014-05-22
  Administered 2014-05-22: 100 ug via INTRAVENOUS

## 2014-05-22 MED ORDER — ONDANSETRON HCL 4 MG/2ML IJ SOLN: 4.0000 mg | Freq: Once | INTRAMUSCULAR | Status: DC | PRN

## 2014-05-22 MED ORDER — ARTIFICIAL TEARS OP OINT
TOPICAL_OINTMENT | OPHTHALMIC | Status: AC
  Filled 2014-05-22: qty 3.5

## 2014-05-22 MED ORDER — HYDROCODONE-ACETAMINOPHEN 7.5-325 MG/15ML PO SOLN: 10.0000 mL | ORAL | Status: DC | PRN

## 2014-05-22 MED ORDER — HYDROMORPHONE HCL PF 1 MG/ML IJ SOLN
0.2500 mg | INTRAMUSCULAR | Status: DC | PRN
  Administered 2014-05-22 (×2): 0.25 mg via INTRAVENOUS
  Administered 2014-05-22: 0.5 mg via INTRAVENOUS

## 2014-05-22 MED ORDER — LACTATED RINGERS IV SOLN
INTRAVENOUS | Status: DC | PRN
  Administered 2014-05-22: 10:00:00 via INTRAVENOUS

## 2014-05-22 MED ORDER — SURGILUBE EX GEL
CUTANEOUS | Status: DC | PRN
  Administered 2014-05-22: 1 via TOPICAL

## 2014-05-22 MED ORDER — ONDANSETRON HCL 4 MG/2ML IJ SOLN
INTRAMUSCULAR | Status: DC | PRN
  Administered 2014-05-22: 4 mg via INTRAVENOUS

## 2014-05-22 MED ORDER — PROPOFOL 10 MG/ML IV BOLUS
INTRAVENOUS | Status: AC
  Filled 2014-05-22: qty 20

## 2014-05-22 MED ORDER — AMOXICILLIN-POT CLAVULANATE 250-62.5 MG/5ML PO SUSR
10.0000 mL | Freq: Two times a day (BID) | ORAL | Status: DC
Start: 2014-05-22 — End: 2015-05-28

## 2014-05-22 MED ORDER — LACTATED RINGERS IV SOLN
INTRAVENOUS | Status: DC
  Administered 2014-05-22: 08:00:00 via INTRAVENOUS

## 2014-05-22 MED ORDER — MIDAZOLAM HCL 2 MG/2ML IJ SOLN
INTRAMUSCULAR | Status: AC
  Filled 2014-05-22: qty 2

## 2014-05-22 MED ORDER — OXYCODONE HCL 5 MG/5ML PO SOLN: 5.0000 mg | Freq: Once | ORAL | Status: DC | PRN

## 2014-05-22 MED ORDER — FENTANYL CITRATE 0.05 MG/ML IJ SOLN
INTRAMUSCULAR | Status: AC
  Filled 2014-05-22: qty 5

## 2014-05-22 MED ORDER — HYDROMORPHONE HCL PF 1 MG/ML IJ SOLN
INTRAMUSCULAR | Status: AC
  Filled 2014-05-22: qty 1

## 2014-05-22 SURGICAL SUPPLY — 22 items
BALLN PULM 15 16.5 18 X 75CM (BALLOONS)
BALLN PULM 15 16.5 18X75 (BALLOONS)
BALLOON PULM 15 16.5 18X75 (BALLOONS) ×1 IMPLANT
BLADE 10 SAFETY STRL DISP (BLADE) ×1 IMPLANT
CANISTER SUCTION 2500CC (MISCELLANEOUS) ×3 IMPLANT
CONT SPEC 4OZ CLIKSEAL STRL BL (MISCELLANEOUS) IMPLANT
COVER MAYO STAND STRL (DRAPES) IMPLANT
DRAPE PROXIMA HALF (DRAPES) ×3 IMPLANT
DRESSING TELFA 8X3 (GAUZE/BANDAGES/DRESSINGS) IMPLANT
GAUZE SPONGE 4X4 16PLY XRAY LF (GAUZE/BANDAGES/DRESSINGS) ×3 IMPLANT
GLOVE BIOGEL M 7.0 STRL (GLOVE) ×3 IMPLANT
GUARD TEETH (MISCELLANEOUS) IMPLANT
KIT ROOM TURNOVER OR (KITS) ×3 IMPLANT
MARKER SKIN DUAL TIP RULER LAB (MISCELLANEOUS) IMPLANT
NS IRRIG 1000ML POUR BTL (IV SOLUTION) ×3 IMPLANT
PAD ARMBOARD 7.5X6 YLW CONV (MISCELLANEOUS) ×6 IMPLANT
PATTIES SURGICAL .5 X3 (DISPOSABLE) IMPLANT
SURGILUBE 2OZ TUBE FLIPTOP (MISCELLANEOUS) ×3 IMPLANT
TOWEL OR 17X24 6PK STRL BLUE (TOWEL DISPOSABLE) ×6 IMPLANT
TUBE CONNECTING 12'X1/4 (SUCTIONS) ×1
TUBE CONNECTING 12X1/4 (SUCTIONS) ×2 IMPLANT
WATER STERILE IRR 1000ML POUR (IV SOLUTION) ×3 IMPLANT

## 2014-05-22 NOTE — Anesthesia Preprocedure Evaluation (Addendum)
Anesthesia Evaluation  Patient identified by MRN, date of birth, ID band Patient awake    Reviewed: Allergy & Precautions, H&P , NPO status , Patient's Chart, lab work & pertinent test results  Airway       Dental   Pulmonary COPDCurrent Smoker,          Cardiovascular hypertension, Pt. on medications     Neuro/Psych  Headaches, Anxiety Depression CVA    GI/Hepatic hiatal hernia, GERD-  Medicated and Controlled,  Endo/Other    Renal/GU Renal disease     Musculoskeletal   Abdominal   Peds  Hematology   Anesthesia Other Findings   Reproductive/Obstetrics                          Anesthesia Physical Anesthesia Plan  ASA: III  Anesthesia Plan: General   Post-op Pain Management:    Induction: Intravenous  Airway Management Planned: Tracheostomy  Additional Equipment:   Intra-op Plan:   Post-operative Plan: Extubation in OR  Informed Consent: I have reviewed the patients History and Physical, chart, labs and discussed the procedure including the risks, benefits and alternatives for the proposed anesthesia with the patient or authorized representative who has indicated his/her understanding and acceptance.   Dental advisory given  Plan Discussed with: CRNA and Surgeon  Anesthesia Plan Comments:        Anesthesia Quick Evaluation

## 2014-05-22 NOTE — Op Note (Signed)
NAMERUWEYDA, KAAS              ACCOUNT NO.:  [id number]  MEDICAL RECORD NO.:  [id number]  LOCATION:  [facility]                         FACILITY:  [facility]  PHYSICIAN:  Early Chars. [name], M.D.DATE OF BIRTH:  November 12, 1946  DATE OF PROCEDURE:  05/22/2014 DATE OF DISCHARGE:  05/22/2014                              OPERATIVE REPORT   PREOPERATIVE DIAGNOSES: 1. Status post total laryngectomy for supraglottic laryngeal carcinoma     (September 02, 2012). 2. Status post chemo radiation therapy. 3. Progressive dysphagia.  POSTOPERATIVE DIAGNOSES: 1. Status post total laryngectomy for supraglottic laryngeal carcinoma     (September 02, 2012). 2. Status post chemo radiation therapy. 3. Progressive dysphagia.  PROCEDURE:  Esophagoscopy and dilation.  ANESTHESIA:  General.  COMPLICATIONS:  None.  ESTIMATED BLOOD LOSS:  None.  CONDITION:  The patient was transferred from the operating room to the recovery room in stable condition.  BRIEF HISTORY:  The patient is a 68 year old female, followed in our office in September 2013, with a history of T3 squamous cell carcinoma of the supraglottis.  The patient had an obstructing airway tumor and underwent total laryngectomy and bilateral neck dissections for definitive management.  She had primary reconstruction and subsequently underwent postoperative chemoradiation therapy for better disease control.  The patient has done extremely well after surgery and approximately 1 year after surgical procedure, had a secondary tracheoesophageal puncture.  Procedure performed with placement of a voice prosthesis.  She has been voicing well and followup examination has shown no evidence of recurrent squamous cell carcinoma.  Over the last several months, the patient has noted progressive symptoms of dysphagia, particularly for solids and some regurgitation with liquids. No significant pain and no evidence of recurrent tumor.  Barium swallow study performed  showed narrowing at the reconstruction site with presumed subsequent radiation-induced scar tissue.  Given the patient's clinical history and findings, I recommend that we consider pharyngoesophageal dilation to improve symptoms of dysphagia and oral intake.  The risks and benefits of procedure were discussed in detail with the patient and her family and they understood and concurred with our plan for surgery which is scheduled on elective basis at [facility] on May 22, 2014.  DESCRIPTION OF PROCEDURE:  The patient was brought to the operating room and placed in the supine position on the operating table.  General endotracheal anesthesia was established via the patient's existing laryngostoma with a #6 endotracheal tube, it was inserted without difficulty.  The patient's speech prosthesis was left in place throughout the entire surgical procedure and at the conclusion of the procedure, it was in good position.  There was no evidence of dislodgement or other concern.  When the patient was adequately anesthetized, she was positioned and prepped and draped.  We then undertook serial dilation of her pharyngoesophageal stricture using the esophageal dilators, beginning at 20-French and serially dilating to 62- [place].  The dilators were passed beyond the area of stricture and into the patient's esophagus without difficulty.  There was no significant pressure.  The dilators were slid without difficulty after adequate lubrication and there was no apparent bleeding after surgical procedure. Once 38-French had been achieved, surgical procedure was completed.  The dilator  was removed.  The patient was then awakened from anesthetic and was transferred from the operating room to the recovery room in stable condition.  There were no complications and no bleeding, and the patient's prosthesis was intact.          ______________________________ Early Chars. [name], M.D.     DLS/MEDQ   D:  [id number]  T:  05/22/2014  Job:  [id number]  cc:   Dr. Alvy Bimler

## 2014-05-22 NOTE — Transfer of Care (Signed)
Immediate Anesthesia Transfer of Care Note  Patient: [name]  Procedure(s) Performed: Procedure(s): DIRECT LARYNGOSCOPY WITH ESOPHAGEAL DILATION (N/A)  Patient Location: PACU  Anesthesia Type:General  Level of Consciousness: awake, alert  and oriented  Airway & Oxygen Therapy: Patient Spontanous Breathing and Patient connected to tracheostomy mask oxygen  Post-op Assessment: Report given to PACU RN  Post vital signs: Reviewed and stable  Complications: No apparent anesthesia complications

## 2014-05-22 NOTE — Anesthesia Postprocedure Evaluation (Signed)
Anesthesia Post Note  Patient: [name]  Procedure(s) Performed: Procedure(s) (LRB): DIRECT LARYNGOSCOPY WITH ESOPHAGEAL DILATION (N/A)  Anesthesia type: general  Patient location: PACU  Post pain: Pain level controlled  Post assessment: Patient's Cardiovascular Status Stable  Last Vitals:  Filed Vitals:   05/22/14 1145  BP: 148/62  Pulse: 52  Temp: 36.4 C  Resp: 18    Post vital signs: Reviewed and stable  Level of consciousness: sedated  Complications: No apparent anesthesia complications

## 2014-05-22 NOTE — H&P (Signed)
[name] is an 68 y.o. female.   Chief Complaint: Progressive dysphagia HPI: Pt s/p laryngectomy and neck dissections with post op XRT. Pt ~18 months postop with progressive dysphagia.  Past Medical History  Diagnosis Date  . Hypercholesteremia     takes Pravastatin daily  . Uterine cancer   . Hiatal hernia 08/10/2012  . 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 Hypertension     takes Tribenzor and Atenolol daily  . Headache(784.0)   . GERD (gastroesophageal reflux disease)     takes Zantac daily  . Chronic back pain   . Constipation     related to pain meds  . Nausea     takes Zofran prn  . Anxiety     takes Ativan prn  . Depression   . Insomnia     takes Amitriptyline daily  . Broken ribs   . Blood transfusion without reported diagnosis 09/15/12    2 units Prbc's  . Gastrostomy in place   . History of radiation therapy 10/17/12-11/25/12    supraglottic larynx,high risk neck tumor bed 5880 cGy/28 sessions, high risk lymph node tumor bed 5600 cGy/20 sessions, mod risk lymph node tumor bed 5040 cGy/20 sessions  . COPD (chronic obstructive pulmonary disease) 08/10/2012    denies  . Stroke 2011    denies. 05/21/14 no residual  . Pneumonia     Past Surgical History  Procedure Laterality Date  . Abdominal surgery      r/t uterine carcinoma  . Tracheostomy tube placement  08/10/2012    Procedure: TRACHEOSTOMY;  Surgeon: Jerrell Belfast, MD;  [facility]: [facility];  Service: ENT;  Laterality: N/A;  . Laryngoscopy  08/10/2012    Procedure: LARYNGOSCOPY;  Surgeon: Jerrell Belfast, MD;  Location[facility];  Service: ENT;  Laterality: N/A;  with biopsy  . Appendectomy    . Gastrostomy w/ feeding tube  13  . Laryngetomy  08/31/2012    Procedure: LARYNGECTOMY;  Surgeon: Jerrell Belfast, MD;  Location: [place];  Service: ENT;  Laterality: N/A;  . Radical neck dissection  08/31/2012    Procedure: RADICAL NECK DISSECTION;  Surgeon: Jerrell Belfast, MD;  Location: [place];   Service: ENT;  Laterality: Bilateral;  . Hernia repair      child  . Tracheal esophogeal puncture with repair stoma N/A 09/08/2013    Procedure: TRACHEAL ESOPHOGEAL PUNCTURE WITH PLACEMENT OF  PROVOX PROSTHESIS ;  Surgeon: Jerrell Belfast, MD;  Location: [place];  Service: ENT;  Laterality: N/A;  . Gastrotomy    . Gastrostomy tube removed   2013    History reviewed. No pertinent family history. Social History:  reports that she has been smoking Cigarettes.  She has a 12.5 pack-year smoking history. She has never used smokeless tobacco. She reports that she does not drink alcohol or use illicit drugs.  Allergies: No Known Allergies  Medications Prior to Admission  Medication Sig Dispense Refill  . amitriptyline (ELAVIL) 10 MG tablet Take 10 mg by mouth at bedtime.      [name] atenolol (TENORMIN) 25 MG tablet Take 12.5 mg by mouth 2 (two) times daily.       [name] FLUoxetine (PROZAC) 40 MG capsule Take 40 mg by mouth daily.      [name] loratadine (CLARITIN) 10 MG tablet Take 10 mg by mouth daily as needed for allergies.       [name] LORazepam (ATIVAN) 1 MG tablet Take 1 mg by mouth daily.      [name]  Olmesartan-Amlodipine-HCTZ (TRIBENZOR) 20-5-12.5 MG TABS Take 1 tablet by mouth daily.      . ranitidine (ZANTAC) 150 MG tablet Take 150 mg by mouth 2 (two) times daily.      . rosuvastatin (CRESTOR) 20 MG tablet Take 20 mg by mouth daily.      . traZODone (DESYREL) 50 MG tablet Take 50 mg by mouth daily as needed for sleep.         Results for orders placed during the hospital encounter of 05/22/14 (from the past 48 hour(s))  BASIC METABOLIC PANEL     Status: Abnormal   Collection Time    05/22/14  8:03 AM      Result Value Ref Range   Sodium 140  137 - 147 mEq/L   Potassium 4.6  3.7 - 5.3 mEq/L   Chloride 103  96 - 112 mEq/L   CO2 21  19 - 32 mEq/L   Glucose, Bld 99  70 - 99 mg/dL   BUN 34 (*) 6 - 23 mg/dL   Creatinine, Ser 1.15 (*) 0.50 - 1.10 mg/dL   Calcium 10.0  8.4 - 10.5 mg/dL   GFR calc non Af Amer  48 (*) >90 mL/min   GFR calc Af Amer 55 (*) >90 mL/min   Comment: (NOTE)     The eGFR has been calculated using the CKD EPI equation.     This calculation has not been validated in all clinical situations.     eGFR's persistently <90 mL/min signify possible Chronic Kidney     Disease.  CBC     Status: Abnormal   Collection Time    05/22/14  8:03 AM      Result Value Ref Range   WBC 5.4  4.0 - 10.5 K/uL   RBC 4.03  3.87 - 5.11 MIL/uL   Hemoglobin 11.2 (*) 12.0 - 15.0 g/dL   HCT 34.4 (*) 36.0 - 46.0 %   MCV 85.4  78.0 - 100.0 fL   MCH 27.8  26.0 - 34.0 pg   MCHC 32.6  30.0 - 36.0 g/dL   RDW 13.4  11.5 - 15.5 %   Platelets 209  150 - 400 K/uL   No results found.  Review of Systems  Constitutional: Negative.   HENT: Negative.   Respiratory: Negative.   Cardiovascular: Negative.   Gastrointestinal: Negative.   Musculoskeletal: Negative.   Neurological: Negative.     Blood pressure 152/70, pulse 60, temperature 98.5 F (36.9 C), temperature source Oral, height 5' (1.524 m), weight 52.631 kg (116 lb 0.5 oz), SpO2 100.00%. Physical Exam  Constitutional: She is oriented to person, place, and time. She appears well-developed and well-nourished.  Neck: Normal range of motion. Neck supple.  Stable Laryngostoma, voice prosthesis in place Post XRT changes  Cardiovascular: Normal rate.   Respiratory: Effort normal.  GI: Soft.  Musculoskeletal: Normal range of motion.  Neurological: She is alert and oriented to person, place, and time.     Assessment/Plan Adm for OP pharyngo-esophageal dilation under GA as an OP  Jerrell Belfast 05/22/2014, 9:38 AM

## 2014-05-22 NOTE — Brief Op Note (Signed)
05/22/2014  10:57 AM  PATIENT:  [name]  68 y.o. female  PRE-OPERATIVE DIAGNOSIS:  HISTORY OF CANCER IN THE LARYNX  POST-OPERATIVE DIAGNOSIS:  HISTORY OF CANCER IN THE LARYNX  PROCEDURE:  Esophageal Dilation  SURGEON:  Surgeon(s) and Role:    * Jerrell Belfast, MD - Primary  PHYSICIAN ASSISTANT:   ASSISTANTS: none   ANESTHESIA:   general  EBL:  Total I/O In: 500 [I.V.:500] Out: - min  BLOOD ADMINISTERED:none  DRAINS: none   LOCAL MEDICATIONS USED:  NONE  SPECIMEN:  No Specimen  DISPOSITION OF SPECIMEN:  N/A  COUNTS:  YES  TOURNIQUET:  * No tourniquets in log *  DICTATION: .Other Dictation: Dictation Number [id number]  PLAN OF CARE: Discharge to home after PACU  PATIENT DISPOSITION:  PACU - hemodynamically stable.   Delay start of Pharmacological VTE agent (>24hrs) due to surgical blood loss or risk of bleeding: not applicable

## 2014-05-24 ENCOUNTER — Encounter (HOSPITAL_COMMUNITY): Payer: Self-pay | Admitting: Otolaryngology

## 2014-11-14 ENCOUNTER — Emergency Department (HOSPITAL_COMMUNITY): Payer: Medicare Other

## 2014-11-14 ENCOUNTER — Encounter (HOSPITAL_COMMUNITY): Payer: Self-pay | Admitting: Emergency Medicine

## 2014-11-14 ENCOUNTER — Emergency Department (HOSPITAL_COMMUNITY)
Admission: EM | Admit: 2014-11-14 | Discharge: 2014-11-15 | Disposition: A | Discharge status: Home / Self Care | Payer: Medicare Other | Attending: Emergency Medicine | Admitting: Emergency Medicine

## 2014-11-14 DIAGNOSIS — Z79899 Other long term (current) drug therapy: Secondary | ICD-10-CM | POA: Insufficient documentation

## 2014-11-14 DIAGNOSIS — Z923 Personal history of irradiation: Secondary | ICD-10-CM | POA: Diagnosis not present

## 2014-11-14 DIAGNOSIS — I10 Essential (primary) hypertension: Secondary | ICD-10-CM | POA: Diagnosis not present

## 2014-11-14 DIAGNOSIS — F329 Major depressive disorder, single episode, unspecified: Secondary | ICD-10-CM | POA: Diagnosis not present

## 2014-11-14 DIAGNOSIS — G8929 Other chronic pain: Secondary | ICD-10-CM | POA: Insufficient documentation

## 2014-11-14 DIAGNOSIS — J441 Chronic obstructive pulmonary disease with (acute) exacerbation: Secondary | ICD-10-CM | POA: Diagnosis not present

## 2014-11-14 DIAGNOSIS — G4700 Insomnia, unspecified: Secondary | ICD-10-CM | POA: Insufficient documentation

## 2014-11-14 DIAGNOSIS — R109 Unspecified abdominal pain: Secondary | ICD-10-CM | POA: Insufficient documentation

## 2014-11-14 DIAGNOSIS — E780 Pure hypercholesterolemia: Secondary | ICD-10-CM | POA: Diagnosis not present

## 2014-11-14 DIAGNOSIS — Z85818 Personal history of malignant neoplasm of other sites of lip, oral cavity, and pharynx: Secondary | ICD-10-CM | POA: Insufficient documentation

## 2014-11-14 DIAGNOSIS — R072 Precordial pain: Secondary | ICD-10-CM | POA: Insufficient documentation

## 2014-11-14 DIAGNOSIS — R079 Chest pain, unspecified: Secondary | ICD-10-CM | POA: Diagnosis present

## 2014-11-14 DIAGNOSIS — Z8781 Personal history of (healed) traumatic fracture: Secondary | ICD-10-CM | POA: Insufficient documentation

## 2014-11-14 DIAGNOSIS — Z8541 Personal history of malignant neoplasm of cervix uteri: Secondary | ICD-10-CM | POA: Insufficient documentation

## 2014-11-14 DIAGNOSIS — F419 Anxiety disorder, unspecified: Secondary | ICD-10-CM | POA: Diagnosis not present

## 2014-11-14 DIAGNOSIS — K219 Gastro-esophageal reflux disease without esophagitis: Secondary | ICD-10-CM | POA: Insufficient documentation

## 2014-11-14 DIAGNOSIS — Z8673 Personal history of transient ischemic attack (TIA), and cerebral infarction without residual deficits: Secondary | ICD-10-CM | POA: Diagnosis not present

## 2014-11-14 DIAGNOSIS — Z720 Tobacco use: Secondary | ICD-10-CM | POA: Insufficient documentation

## 2014-11-14 DIAGNOSIS — Z8701 Personal history of pneumonia (recurrent): Secondary | ICD-10-CM | POA: Diagnosis not present

## 2014-11-14 LAB — CBC
HCT: 35.2 % — ABNORMAL LOW (ref 36.0–46.0)
HEMOGLOBIN: 11.2 g/dL — AB (ref 12.0–15.0)
MCH: 27.3 pg (ref 26.0–34.0)
MCHC: 31.8 g/dL (ref 30.0–36.0)
MCV: 85.6 fL (ref 78.0–100.0)
PLATELETS: 214 10*3/uL (ref 150–400)
RBC: 4.11 MIL/uL (ref 3.87–5.11)
RDW: 14.1 % (ref 11.5–15.5)
WBC: 7 10*3/uL (ref 4.0–10.5)

## 2014-11-14 MED ORDER — GI COCKTAIL ~~LOC~~
30.0000 mL | Freq: Once | ORAL | Status: AC
  Administered 2014-11-14: 30 mL via ORAL
  Filled 2014-11-14: qty 30

## 2014-11-14 NOTE — ED Provider Notes (Signed)
CSN: [id number]     Arrival date & time 11/14/14  2250 History  This chart was scribed for Ann Rice, MD by Delphia Grates, ED Scribe. This patient was seen in room A13C/A13C and the patient's care was started at 11:12 PM.   Chief Complaint  Patient presents with  . Chest Pain    The history is provided by the patient and a relative. No language interpreter was used.    HPI Comments: [name] is a 68 y.o. female brought in by ambulance, who presents to the Emergency Department complaining of sudden onset substernal chest pain that began approximately 2 hours ago. Per son, upon his arrival to the house, he states the patient felt light headed and dizzy. He states that she went to her room shortly after.  He states that when he checked on her she was in bed with the lights off, lying on her right side, shaking. He states she was not responding to her named being called, and reports his wife called 911 and informed them to lay the patient flat. Once she was supine, he states he made sure he head was down and states the paramedics arrive 2 minutes later. Upon their arrival, he states the patient was gesturing to her chest and complained of sharp substernal chest pain that radiates to her abdomen. Patient's daughter-in-law states the patient was complaining of left arm pain prior to her son's arrival. Patient states she is still in a lot of pain at this time. Patient has stoma in place and son reports she was experiencing SOB and coughing during this episode. Patient has history of appendectomy (at 68 years of age). No nausea or vomiting.  Past Medical History  Diagnosis Date  . Hypercholesteremia     takes Pravastatin daily  . Uterine cancer   . Hiatal hernia 08/10/2012  . 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 Hypertension     takes Tribenzor and Atenolol daily  . Headache(784.0)   . GERD (gastroesophageal reflux disease)     takes Zantac daily  . Chronic  back pain   . Constipation     related to pain meds  . Nausea     takes Zofran prn  . Anxiety     takes Ativan prn  . Depression   . Insomnia     takes Amitriptyline daily  . Broken ribs   . Blood transfusion without reported diagnosis 09/15/12    2 units Prbc's  . Gastrostomy in place   . History of radiation therapy 10/17/12-11/25/12    supraglottic larynx,high risk neck tumor bed 5880 cGy/28 sessions, high risk lymph node tumor bed 5600 cGy/20 sessions, mod risk lymph node tumor bed 5040 cGy/20 sessions  . COPD (chronic obstructive pulmonary disease) 08/10/2012    denies  . Stroke 2011    denies. 05/21/14 no residual  . Pneumonia    Past Surgical History  Procedure Laterality Date  . Abdominal surgery      r/t uterine carcinoma  . Tracheostomy tube placement  08/10/2012    Procedure: TRACHEOSTOMY;  Surgeon: Jerrell Belfast, MD;  [facility];  Service: ENT;  Laterality: N/A;  . Laryngoscopy  08/10/2012    Procedure: LARYNGOSCOPY;  Surgeon: Jerrell Belfast, MD;  [facility];  Service: ENT;  Laterality: N/A;  with biopsy  . Appendectomy    . Gastrostomy w/ feeding tube  13  . Laryngetomy  08/31/2012    Procedure: LARYNGECTOMY;  Surgeon: Jerrell Belfast,  MD;  Location: [facility];  Service: ENT;  Laterality: N/A;  . Radical neck dissection  08/31/2012    Procedure: RADICAL NECK DISSECTION;  Surgeon: Jerrell Belfast, MD;  Location: [facility];  Service: ENT;  Laterality: Bilateral;  . Hernia repair      child  . Tracheal esophogeal puncture with repair stoma N/A 09/08/2013    Procedure: TRACHEAL ESOPHOGEAL PUNCTURE WITH PLACEMENT OF  PROVOX PROSTHESIS ;  Surgeon: Jerrell Belfast, MD;  Location: [facility];  Service: ENT;  Laterality: N/A;  . Gastrotomy    . Gastrostomy tube removed   2013  . Direct laryngoscopy N/A 05/22/2014    Procedure: DIRECT LARYNGOSCOPY WITH ESOPHAGEAL DILATION;  Surgeon: Jerrell Belfast, MD;  Location: [facility];  Service: ENT;  Laterality: N/A;   History reviewed. No  pertinent family history. History  Substance Use Topics  . Smoking status: Current Some Day Smoker -- 0.25 packs/day for 50 years    Types: Cigarettes  . Smokeless tobacco: Never Used  . Alcohol Use: No   OB History    No data available     Review of Systems  Constitutional: Negative for fever and chills.  Respiratory: Positive for shortness of breath.   Cardiovascular: Positive for chest pain. Negative for palpitations and leg swelling.  Gastrointestinal: Positive for abdominal pain. Negative for nausea, vomiting, diarrhea and constipation.  Musculoskeletal: Negative for back pain, neck pain and neck stiffness.  Skin: Negative for rash and wound.  Neurological: Positive for dizziness and light-headedness. Negative for weakness, numbness and headaches.  All other systems reviewed and are negative.     Allergies  Review of patient's allergies indicates no known allergies.  Home Medications   Prior to Admission medications   Medication Sig Start Date End Date Taking? Authorizing Provider  amitriptyline (ELAVIL) 10 MG tablet Take 10 mg by mouth at bedtime.    Historical Provider, MD  amoxicillin-clavulanate (AUGMENTIN) 250-62.5 MG/5ML suspension Take 10 mLs by mouth 2 (two) times daily. 05/22/14   Jerrell Belfast, MD  atenolol (TENORMIN) 25 MG tablet Take 12.5 mg by mouth 2 (two) times daily.  01/13/13   Historical Provider, MD  FLUoxetine (PROZAC) 40 MG capsule Take 40 mg by mouth daily.    Historical Provider, MD  HYDROcodone-acetaminophen (HYCET) 7.5-325 mg/15 ml solution Take 10-15 mLs by mouth every 4 (four) hours as needed for moderate pain. 05/22/14   Jerrell Belfast, MD  loratadine (CLARITIN) 10 MG tablet Take 10 mg by mouth daily as needed for allergies.     Historical Provider, MD  LORazepam (ATIVAN) 1 MG tablet Take 1 mg by mouth daily. 09/14/12   Jerrell Belfast, MD  Olmesartan-Amlodipine-HCTZ [facility]) 20-5-12.5 MG TABS Take 1 tablet by mouth daily.    Historical  Provider, MD  ranitidine (ZANTAC) 150 MG tablet Take 150 mg by mouth 2 (two) times daily.    Historical Provider, MD  rosuvastatin (CRESTOR) 20 MG tablet Take 20 mg by mouth daily.    Historical Provider, MD  traZODone (DESYREL) 50 MG tablet Take 50 mg by mouth daily as needed for sleep.     Historical Provider, MD   Triage Vitals: BP 159/64 mmHg  Pulse 64  Temp(Src) 98.1 F (36.7 C) (Oral)  Resp 17  Ht 5\' 3"  (1.6 m)  Wt 115 lb (52.164 kg)  BMI 20.38 kg/m2  SpO2 100%  Physical Exam  Constitutional: She is oriented to person, place, and time. She appears well-developed and well-nourished. No distress.  HENT:  Head: Normocephalic and  atraumatic.  Mouth/Throat: Oropharynx is clear and moist. No oropharyngeal exudate.  Eyes: EOM are normal. Pupils are equal, round, and reactive to light.  Neck: Normal range of motion. Neck supple.  Stoma present.  Cardiovascular: Normal rate and regular rhythm.   Pulmonary/Chest: Effort normal and breath sounds normal. No respiratory distress. She has no wheezes. She has no rales. She exhibits no tenderness.  Abdominal: Soft. Bowel sounds are normal. She exhibits no distension and no mass. There is tenderness (tenderness to palpation in the epigastric region.). There is no rebound and no guarding.  Musculoskeletal: Normal range of motion. She exhibits no edema or tenderness.  No calf swelling or tenderness.  Neurological: She is alert and oriented to person, place, and time.  Moves all extremities without deficit. Sensation is intact.  Skin: Skin is warm and dry. No rash noted. No erythema.  Psychiatric:  Mildly anxious appearing  Nursing note and vitals reviewed.   ED Course  Procedures (including critical care time)  DIAGNOSTIC STUDIES: Oxygen Saturation is 100% on room air, normal by my interpretation.    COORDINATION OF CARE: At 2322 Discussed treatment plan with patient. Patient agrees.   Labs Review Labs Reviewed  CBC - Abnormal;  Notable for the following:    Hemoglobin 11.2 (*)    HCT 35.2 (*)    All other components within normal limits  HEPATIC FUNCTION PANEL - Abnormal; Notable for the following:    Total Bilirubin 0.2 (*)    All other components within normal limits  BASIC METABOLIC PANEL - Abnormal; Notable for the following:    Glucose, Bld 101 (*)    BUN 27 (*)    Creatinine, Ser 1.37 (*)    GFR calc non Af Amer 39 (*)    GFR calc Af Amer 45 (*)    Anion gap 19 (*)    All other components within normal limits  LIPASE, BLOOD  TROPONIN I  I-STAT TROPOININ, ED  I-STAT TROPOININ, ED    Imaging Review Dg Chest 2 View  11/15/2014   CLINICAL DATA:  Acute chest pain, cough and fever. Initial encounter.  EXAM: CHEST  2 VIEW  COMPARISON:  09/28/2013 and prior radiographs  FINDINGS: The cardiomediastinal silhouette is unremarkable.  Interstitial prominence again noted.  There is no evidence of focal airspace disease, pulmonary edema, suspicious pulmonary nodule/mass, pleural effusion, or pneumothorax.  No acute bony abnormalities are identified.  IMPRESSION: No active cardiopulmonary disease.   Electronically Signed   By: Hassan Rowan M.D.   On: 11/15/2014 00:45     EKG Interpretation None      Date: 11/15/2014  Rate: 64  Rhythm: normal sinus rhythm  QRS Axis: normal  Intervals: normal  ST/T Wave abnormalities: nonspecific T wave changes  Conduction Disutrbances:none  Narrative Interpretation:   Old EKG Reviewed: Similar EKG to 10/15   MDM   Final diagnoses:  Chest pain    I personally performed the services described in this documentation, which was scribed in my presence. The recorded information has been reviewed and is accurate.  Patient with atypical presentation. Initial EKG and troponin without any acute changes. Patient has a history of GERD and anxiety. Her symptoms are greatly improved after GI cocktail. She is anxious in the room. We'll repeat troponin at 3 hour mark. Anticipate discharge  home. Repeat troponin is normal. We'll discharge home. Return precautions given.  Ann Rice, MD 11/15/14 [phone]

## 2014-11-14 NOTE — ED Notes (Signed)
Per EMS- pt c/o substernal chest pain 10/10. Never had anything like this before. Pt has tracheostomy. Pt given 324 ASA PTA. Pt refused nitro PTA. 20 G PIV R AC. Pain 7/10 at present. Pt did not want to come in for treatment.

## 2014-11-14 NOTE — ED Notes (Signed)
Pt family at bedside, reports patient is having a lot of anxiety lately, today is her sons birthday and he is in another country and she misses him. Her other son and her are fighting and not speaking. Pt family reports pt is having an anxiety attack. Pt is crying at present.

## 2014-11-14 NOTE — ED Notes (Signed)
Patient transported to X-ray 

## 2014-11-15 DIAGNOSIS — R072 Precordial pain: Secondary | ICD-10-CM | POA: Diagnosis not present

## 2014-11-15 LAB — HEPATIC FUNCTION PANEL
ALBUMIN: 4.2 g/dL (ref 3.5–5.2)
ALT: 27 U/L (ref 0–35)
AST: 27 U/L (ref 0–37)
Alkaline Phosphatase: 93 U/L (ref 39–117)
Bilirubin, Direct: <0.2 mg/dL (ref 0.0–0.3)
Total Bilirubin: 0.2 mg/dL — ABNORMAL LOW (ref 0.3–1.2)
Total Protein: 7.7 g/dL (ref 6.0–8.3)

## 2014-11-15 LAB — BASIC METABOLIC PANEL
Anion gap: 19 — ABNORMAL HIGH (ref 5–15)
BUN: 27 mg/dL — ABNORMAL HIGH (ref 6–23)
CALCIUM: 10.2 mg/dL (ref 8.4–10.5)
CO2: 22 mEq/L (ref 19–32)
Chloride: 100 mEq/L (ref 96–112)
Creatinine, Ser: 1.37 mg/dL — ABNORMAL HIGH (ref 0.50–1.10)
GFR calc non Af Amer: 39 mL/min — ABNORMAL LOW (ref >90)
GFR, EST AFRICAN AMERICAN: 45 mL/min — AB (ref >90)
GLUCOSE: 101 mg/dL — AB (ref 70–99)
POTASSIUM: 4.2 meq/L (ref 3.7–5.3)
SODIUM: 141 meq/L (ref 137–147)

## 2014-11-15 LAB — I-STAT TROPONIN, ED: Troponin i, poc: 0 ng/mL (ref 0.00–0.08)

## 2014-11-15 LAB — LIPASE, BLOOD: Lipase: 57 U/L (ref 11–59)

## 2014-11-15 LAB — TROPONIN I

## 2014-11-15 NOTE — Discharge Instructions (Signed)
Dolor de pecho (no especfico) (Chest Pain (Nonspecific)) Con frecuencia es difcil dar un diagnstico especfico de la causa del dolor de [place]. Siempre hay una posibilidad de que el dolor podra estar relacionado con algo grave, como un ataque al corazn o un cogulo sanguneo en los pulmones. Debe someterse a controles con el mdico para ms evaluaciones. CAUSAS   Acidez.  Neumona o bronquitis.  Ansiedad o estrs.  Inflamacin de la zona que rodea al corazn (pericarditis) o a los pulmones (pleuritis o pleuresa).  Un cogulo sanguneo en el pulmn.  Colapso de un pulmn (neumotrax), que puede aparecer de [employer] repentina por s solo (neumotrax espontneo) o debido a un traumatismo en el trax.  Culebrilla (virus del herpes zster). La pared torcica est compuesta por huesos, msculos y Database administrator. Cualquiera de estos puede ser la fuente del dolor.  Puede haber una contusin en los huesos debido a una lesin.  Puede haber un esguince en los msculos o el cartlago ocasionado por la tos o por [place].  El cartlago puede verse afectado por una inflamacin y Engineer, production (costocondritis). DIAGNSTICO  Ileene Hutchinson se necesiten anlisis de laboratorio u otros estudios para Animator causa del Social research officer, government. Adems, puede indicarle que se haga una prueba llamada electrocadiograma (ECG) ambulatorio. El ECG registra los patrones de los latidos cardacos durante 24horas. Adems, pueden hacerle otros estudios, por ejemplo:  Ecocardiograma transtorcico (ETT). Durante IT trainer, se usan ondas sonoras para evaluar el flujo de la sangre a travs del corazn.  Ecocardiograma transesofgico (ETE).  Monitoreo cardaco. Permite que el mdico controle la frecuencia y el ritmo cardaco en tiempo real.  Monitor Holter. Es un dispositivo porttil que [employer] latidos cardacos y [place] a Retail buyer las arritmias cardacas. Le permite al [employer] registrar la actividad [place], si es necesario.  Pruebas de estrs por ejercicio o por medicamentos que aceleran los latidos cardacos. TRATAMIENTO   El tratamiento depende de la causa del dolor de [place]. El tratamiento puede incluir:  Inhibidores de la acidez estomacal.  Antiinflamatorios.  Analgsicos para las enfermedades inflamatorias.  Antibiticos, si hay una infeccin.  Podrn aconsejarle que modifique su estilo de vida. Esto incluye dejar de fumar y evitar el alcohol, la cafena y el chocolate.  Pueden aconsejarle que mantenga la cabeza levantada (elevada) cuando duerme. Esto reduce la probabilidad de que el cido retroceda del estmago al esfago. En la [employer], el dolor de pecho no especfico mejorar en el trmino de 2 a 3das, con reposo y [employer].  INSTRUCCIONES PARA EL CUIDADO EN EL HOGAR   Si le prescriben antibiticos, tmelos tal como se le indic. Termnelos aunque comience a sentirse mejor.  [address] no haga actividades fsicas que provoquen dolor de [place]. Contine con las actividades fsicas tal como se le indic  No consuma ningn producto que contenga tabaco, incluidos cigarrillos, tabaco de Higher education careers adviser o cigarrillos electrnicos.  Evite el consumo de alcohol.  Tome los medicamentos solamente como se lo haya indicado el mdico.  Siga las sugerencias del mdico en lo que respecta a las pruebas adicionales, si el dolor de pecho no desaparece.  Concurra a todas las visitas de control programadas. Si no lo hace, podra desarrollar problemas permanentes (crnicos) relacionados con el dolor. Si hay algn problema para concurrir a una cita, llame para reprogramarla. SOLICITE ATENCIN MDICA SI:   El dolor de pecho no desaparece, incluso despus del tratamiento.  Tiene una erupcin cutnea con ampollas en el  pecho.  Jaclynn Guarneri. SOLICITE ATENCIN MDICA DE [employer] SI:   Aumenta el dolor de pecho o este se irradia hacia el  brazo, el cuello, la [place], la espalda o el abdomen.  Le falta el aire.  La tos empeora, o expectora sangre.  Siente dolor intenso en la espalda o el abdomen.  Se siente nauseoso o vomita.  Siente debilidad intensa.  Se desmaya.  Tiene escalofros. Esto es Engineer, maintenance (IT). No espere a ver si el dolor se pasa. Obtenga ayuda mdica de inmediato. Llame a los servicios de emergencia locales (911 en [place]). No conduzca por sus propios medios [employer] hospital. ASEGRESE DE QUE:   Comprende estas instrucciones.  Controlar su afeccin.  Recibir ayuda de inmediato si no mejora o si empeora. Document Released: 11/30/2005 Document Revised: 12/05/2013 [facility] Patient Information 2015 [place]. This information is not intended to replace advice given to you by your health care provider. Make sure you discuss any questions you have with your health care provider.

## 2014-11-21 ENCOUNTER — Telehealth (HOSPITAL_COMMUNITY): Payer: Self-pay | Admitting: *Deleted

## 2014-12-12 ENCOUNTER — Telehealth (HOSPITAL_COMMUNITY): Payer: Self-pay | Admitting: *Deleted

## 2014-12-27 ENCOUNTER — Other Ambulatory Visit (HOSPITAL_COMMUNITY): Payer: Self-pay | Admitting: Otolaryngology

## 2014-12-27 DIAGNOSIS — Z8521 Personal history of malignant neoplasm of larynx: Secondary | ICD-10-CM

## 2015-01-08 ENCOUNTER — Telehealth (HOSPITAL_COMMUNITY): Payer: Self-pay | Admitting: *Deleted

## 2015-01-09 ENCOUNTER — Encounter (HOSPITAL_COMMUNITY)
Admission: RE | Admit: 2015-01-09 | Discharge: 2015-01-09 | Disposition: A | Discharge status: Home / Self Care | Payer: Medicare Other | Source: Ambulatory Visit | Attending: Otolaryngology | Admitting: Otolaryngology

## 2015-01-09 DIAGNOSIS — Z8521 Personal history of malignant neoplasm of larynx: Secondary | ICD-10-CM | POA: Insufficient documentation

## 2015-01-09 LAB — GLUCOSE, CAPILLARY: Glucose-Capillary: 95 mg/dL (ref 70–99)

## 2015-01-09 MED ORDER — TECHNETIUM TC 99M MEBROFENIN IV KIT
6.7000 | PACK | Freq: Once | INTRAVENOUS | Status: AC | PRN
  Administered 2015-01-09: 7 via INTRAVENOUS

## 2015-02-01 DIAGNOSIS — M810 Age-related osteoporosis without current pathological fracture: Secondary | ICD-10-CM | POA: Insufficient documentation

## 2015-02-28 DIAGNOSIS — F329 Major depressive disorder, single episode, unspecified: Secondary | ICD-10-CM | POA: Insufficient documentation

## 2015-03-01 ENCOUNTER — Other Ambulatory Visit (HOSPITAL_COMMUNITY): Payer: Self-pay | Admitting: Family Medicine

## 2015-03-01 DIAGNOSIS — I739 Peripheral vascular disease, unspecified: Secondary | ICD-10-CM

## 2015-03-29 ENCOUNTER — Inpatient Hospital Stay (HOSPITAL_COMMUNITY): Admission: RE | Admit: 2015-03-29 | Payer: Self-pay | Source: Ambulatory Visit

## 2015-05-20 ENCOUNTER — Other Ambulatory Visit: Payer: Self-pay | Admitting: Otolaryngology

## 2015-05-28 ENCOUNTER — Encounter (HOSPITAL_COMMUNITY)
Admission: RE | Admit: 2015-05-28 | Discharge: 2015-05-28 | Disposition: A | Discharge status: Home / Self Care | Payer: Medicare Other | Source: Ambulatory Visit | Attending: Otolaryngology | Admitting: Otolaryngology

## 2015-05-28 ENCOUNTER — Other Ambulatory Visit: Payer: Self-pay | Admitting: Otolaryngology

## 2015-05-28 ENCOUNTER — Encounter (HOSPITAL_COMMUNITY): Payer: Self-pay

## 2015-05-28 DIAGNOSIS — Y9389 Activity, other specified: Secondary | ICD-10-CM | POA: Diagnosis not present

## 2015-05-28 DIAGNOSIS — Z87891 Personal history of nicotine dependence: Secondary | ICD-10-CM | POA: Diagnosis not present

## 2015-05-28 DIAGNOSIS — F419 Anxiety disorder, unspecified: Secondary | ICD-10-CM | POA: Diagnosis not present

## 2015-05-28 DIAGNOSIS — G8929 Other chronic pain: Secondary | ICD-10-CM | POA: Diagnosis not present

## 2015-05-28 DIAGNOSIS — Z8673 Personal history of transient ischemic attack (TIA), and cerebral infarction without residual deficits: Secondary | ICD-10-CM | POA: Diagnosis not present

## 2015-05-28 DIAGNOSIS — K222 Esophageal obstruction: Secondary | ICD-10-CM | POA: Diagnosis not present

## 2015-05-28 DIAGNOSIS — F329 Major depressive disorder, single episode, unspecified: Secondary | ICD-10-CM | POA: Diagnosis not present

## 2015-05-28 DIAGNOSIS — R1310 Dysphagia, unspecified: Secondary | ICD-10-CM | POA: Diagnosis present

## 2015-05-28 DIAGNOSIS — Z8521 Personal history of malignant neoplasm of larynx: Secondary | ICD-10-CM | POA: Diagnosis not present

## 2015-05-28 DIAGNOSIS — M549 Dorsalgia, unspecified: Secondary | ICD-10-CM | POA: Diagnosis not present

## 2015-05-28 DIAGNOSIS — Y998 Other external cause status: Secondary | ICD-10-CM | POA: Diagnosis not present

## 2015-05-28 DIAGNOSIS — G4700 Insomnia, unspecified: Secondary | ICD-10-CM | POA: Diagnosis not present

## 2015-05-28 DIAGNOSIS — I10 Essential (primary) hypertension: Secondary | ICD-10-CM | POA: Diagnosis not present

## 2015-05-28 DIAGNOSIS — K219 Gastro-esophageal reflux disease without esophagitis: Secondary | ICD-10-CM | POA: Diagnosis not present

## 2015-05-28 DIAGNOSIS — Z931 Gastrostomy status: Secondary | ICD-10-CM | POA: Diagnosis not present

## 2015-05-28 DIAGNOSIS — E780 Pure hypercholesterolemia: Secondary | ICD-10-CM | POA: Diagnosis not present

## 2015-05-28 DIAGNOSIS — Z8542 Personal history of malignant neoplasm of other parts of uterus: Secondary | ICD-10-CM | POA: Diagnosis not present

## 2015-05-28 DIAGNOSIS — J449 Chronic obstructive pulmonary disease, unspecified: Secondary | ICD-10-CM | POA: Diagnosis not present

## 2015-05-28 DIAGNOSIS — Y842 Radiological procedure and radiotherapy as the cause of abnormal reaction of the patient, or of later complication, without mention of misadventure at the time of the procedure: Secondary | ICD-10-CM | POA: Diagnosis not present

## 2015-05-28 DIAGNOSIS — Y9289 Other specified places as the place of occurrence of the external cause: Secondary | ICD-10-CM | POA: Diagnosis not present

## 2015-05-28 HISTORY — DX: Angina pectoris, unspecified: I20.9

## 2015-05-28 HISTORY — DX: Reserved for inherently not codable concepts without codable children: IMO0001

## 2015-05-28 LAB — CBC
HCT: 36.5 % (ref 36.0–46.0)
Hemoglobin: 11.7 g/dL — ABNORMAL LOW (ref 12.0–15.0)
MCH: 27.3 pg (ref 26.0–34.0)
MCHC: 32.1 g/dL (ref 30.0–36.0)
MCV: 85.3 fL (ref 78.0–100.0)
PLATELETS: 218 10*3/uL (ref 150–400)
RBC: 4.28 MIL/uL (ref 3.87–5.11)
RDW: 14.1 % (ref 11.5–15.5)
WBC: 7 10*3/uL (ref 4.0–10.5)

## 2015-05-28 LAB — BASIC METABOLIC PANEL
ANION GAP: 9 (ref 5–15)
BUN: 30 mg/dL — AB (ref 6–20)
CALCIUM: 9.5 mg/dL (ref 8.9–10.3)
CO2: 23 mmol/L (ref 22–32)
Chloride: 108 mmol/L (ref 101–111)
Creatinine, Ser: 1.52 mg/dL — ABNORMAL HIGH (ref 0.44–1.00)
GFR calc Af Amer: 39 mL/min — ABNORMAL LOW (ref >60)
GFR, EST NON AFRICAN AMERICAN: 34 mL/min — AB (ref >60)
Glucose, Bld: 108 mg/dL — ABNORMAL HIGH (ref 65–99)
Potassium: 4.9 mmol/L (ref 3.5–5.1)
SODIUM: 140 mmol/L (ref 135–145)

## 2015-05-28 MED ORDER — DEXAMETHASONE SODIUM PHOSPHATE 10 MG/ML IJ SOLN: 10.0000 mg | Freq: Once | INTRAMUSCULAR | Status: DC

## 2015-05-28 MED ORDER — DEXAMETHASONE SODIUM PHOSPHATE 10 MG/ML IJ SOLN
10.0000 mg | Freq: Once | INTRAMUSCULAR | Status: AC
  Administered 2015-05-29: 10 mg via INTRAVENOUS
  Filled 2015-05-28: qty 1

## 2015-05-28 NOTE — Progress Notes (Signed)
Anesthesia Chart Review: Patient is a 69 year old female scheduled for esophagoscopy with esophageal dilation tomorrow by Dr. [name].  She has a history of laryngeal cancer s/p radiation s/p tracheostomy 08/10/12 then laryngectomy with radial neck dissection 08/31/12. She later had tracheal esophogeal puncture with placement of [employer]ox Prosthesis 09/08/13 and direct laryngoscopy with esophageal dilation 05/22/14.   Other history includes former smoker (quit 2014), hiatal hernia, GERD, HTN, hypercholesterolemia, anxiety, depression, COPD (denied), CVA '11, uterine cancer, gastrostomy tube s/p removal. PCP is listed as Dr. Billey Chang ([place], see Care Everywhere).  Today she used her daughter-in-law to interpret.   Meds include atenolol, Zyrtec, Prozac, Norco, Ativan, Tribenzor, Prilosec, Crestor, trazodone.  05/28/15 EKG: SB at 59 bpm, septal infarct (age undetermined), ST/T wave abnormality in V3-6, consider anterior ischemia. ST abnormality is more pronounced when compared with 2014-11-23 EKG but is new since 09/28/13 tracing. Of note, her November 23, 2014 EKG tracing was initially interpreted as aflutter, although the final interpretation by the ED MD was NSR with non-specific T wave changes. There is a saw tooth pattern, but there are multiple leads that appear to have clear p waves (V3 is best). I do not think she had afib or flutter on her 11/23/2014 EKG.    11-23-2014 CXR: FINDINGS: The cardiomediastinal silhouette is unremarkable. Interstitial prominence again noted. There is no evidence of focal airspace disease, pulmonary edema, suspicious pulmonary nodule/mass, pleural effusion, or pneumothorax. No acute bony abnormalities are identified. IMPRESSION: No active cardiopulmonary disease.  Preoperative labs noted. Cr 1.52, overall appears within her baseline. Glucose 108. H/H 11.7/36.5.   History and EKGs reviewed with anesthesiologist Dr. Linna Caprice.  If she remains asymptomatic from a CV standpoint then it is  anticipated that she can proceed with this procedure.    [facility] Short Stay Center/Anesthesiology Phone [phone] 05/28/2015 2:52 PM

## 2015-05-28 NOTE — Pre-Procedure Instructions (Addendum)
[name]  05/28/2015      [id number] [address], [place] [address] Phone: [phone] Fax: [phone]    Your procedure is scheduled on 05/29/15  Report to [facility] short stay admitting at 800 A.M.  Call this number if you have problems the morning of surgery:  [phone]   Remember:  Do not eat food or drink liquids after midnight.  Take these medicines the morning of surgery with A SIP OF WATER , ativan if needed, atenolol,prozac, pain med if needed, omeprazole  STOP all herbel meds, nsaids (aleve,naproxen,advil,ibuprofen) starting now including vitamins, aspirin       Do not wear jewelry, make-up or nail polish.  Do not wear lotions, powders, or perfumes.  You may wear deodorant.  Do not shave 48 hours prior to surgery.  Men may shave face and neck.  Do not bring valuables to the hospital.  [facility] is not responsible for any belongings or valuables.  Contacts, dentures or bridgework may not be worn into surgery.  Leave your suitcase in the car.  After surgery it may be brought to your room.  For patients admitted to the hospital, discharge time will be determined by your treatment team.  Patients discharged the day of surgery will not be allowed to drive home.   Name and phone number of your driver:    Special instructions:   Special Instructions: [facility] - Preparing for Surgery  Before surgery, you can play an important role.  Because skin is not sterile, your skin needs to be as free of germs as possible.  You can reduce the number of germs on you skin by washing with CHG (chlorahexidine gluconate) soap before surgery.  CHG is an antiseptic cleaner which kills germs and bonds with the skin to continue killing germs even after washing.  Please DO NOT use if you have an allergy to CHG or antibacterial soaps.  If your skin becomes reddened/irritated stop using the CHG and inform your nurse  when you arrive at Short Stay.  Do not shave (including legs and underarms) for at least 48 hours prior to the first CHG shower.  You may shave your face.  Please follow these instructions carefully:   1.  Shower with CHG Soap the night before surgery and the morning of Surgery.  2.  If you choose to wash your hair, wash your hair first as usual with your normal shampoo.  3.  After you shampoo, rinse your hair and body thoroughly to remove the Shampoo.  4.  Use CHG as you would any other liquid soap.  You can apply chg directly  to the skin and wash gently with scrungie or a clean washcloth.  5.  Apply the CHG Soap to your body ONLY FROM THE NECK DOWN.  Do not use on open wounds or open sores.  Avoid contact with your eyes ears, mouth and genitals (private parts).  Wash genitals (private parts)       with your normal soap.  6.  Wash thoroughly, paying special attention to the area where your surgery will be performed.  7.  Thoroughly rinse your body with warm water from the neck down.  8.  DO NOT shower/wash with your normal soap after using and rinsing off the CHG Soap.  9.  Pat yourself dry with a clean towel.            10.  Wear clean pajamas.            11.  Place clean sheets on your bed the night of your first shower and do not sleep with pets.  Day of Surgery  Do not apply any lotions/deodorants the morning of surgery.  Please wear clean clothes to the hospital/surgery center.  Please read over the following fact sheets that you were given. Pain Booklet, Coughing and Deep Breathing and Surgical Site Infection Prevention

## 2015-05-29 ENCOUNTER — Ambulatory Visit (HOSPITAL_COMMUNITY): Payer: Medicare Other | Admitting: Vascular Surgery

## 2015-05-29 ENCOUNTER — Encounter (HOSPITAL_COMMUNITY)
Admission: RE | Disposition: A | Discharge status: Home / Self Care | Payer: Self-pay | Source: Ambulatory Visit | Attending: Otolaryngology

## 2015-05-29 ENCOUNTER — Ambulatory Visit (HOSPITAL_COMMUNITY)
Admission: RE | Admit: 2015-05-29 | Discharge: 2015-05-29 | Disposition: A | Discharge status: Home / Self Care | Payer: Medicare Other | Source: Ambulatory Visit | Attending: Otolaryngology | Admitting: Otolaryngology

## 2015-05-29 ENCOUNTER — Ambulatory Visit (HOSPITAL_COMMUNITY): Payer: Medicare Other | Admitting: Certified Registered"

## 2015-05-29 DIAGNOSIS — E780 Pure hypercholesterolemia: Secondary | ICD-10-CM | POA: Diagnosis not present

## 2015-05-29 DIAGNOSIS — Z8673 Personal history of transient ischemic attack (TIA), and cerebral infarction without residual deficits: Secondary | ICD-10-CM | POA: Insufficient documentation

## 2015-05-29 DIAGNOSIS — I10 Essential (primary) hypertension: Secondary | ICD-10-CM | POA: Insufficient documentation

## 2015-05-29 DIAGNOSIS — Y9389 Activity, other specified: Secondary | ICD-10-CM | POA: Insufficient documentation

## 2015-05-29 DIAGNOSIS — J449 Chronic obstructive pulmonary disease, unspecified: Secondary | ICD-10-CM | POA: Insufficient documentation

## 2015-05-29 DIAGNOSIS — K219 Gastro-esophageal reflux disease without esophagitis: Secondary | ICD-10-CM | POA: Diagnosis not present

## 2015-05-29 DIAGNOSIS — K222 Esophageal obstruction: Secondary | ICD-10-CM | POA: Insufficient documentation

## 2015-05-29 DIAGNOSIS — M549 Dorsalgia, unspecified: Secondary | ICD-10-CM | POA: Insufficient documentation

## 2015-05-29 DIAGNOSIS — Y842 Radiological procedure and radiotherapy as the cause of abnormal reaction of the patient, or of later complication, without mention of misadventure at the time of the procedure: Secondary | ICD-10-CM | POA: Insufficient documentation

## 2015-05-29 DIAGNOSIS — Z8521 Personal history of malignant neoplasm of larynx: Secondary | ICD-10-CM | POA: Insufficient documentation

## 2015-05-29 DIAGNOSIS — Z87891 Personal history of nicotine dependence: Secondary | ICD-10-CM | POA: Insufficient documentation

## 2015-05-29 DIAGNOSIS — Z8542 Personal history of malignant neoplasm of other parts of uterus: Secondary | ICD-10-CM | POA: Insufficient documentation

## 2015-05-29 DIAGNOSIS — Z931 Gastrostomy status: Secondary | ICD-10-CM | POA: Insufficient documentation

## 2015-05-29 DIAGNOSIS — G4700 Insomnia, unspecified: Secondary | ICD-10-CM | POA: Insufficient documentation

## 2015-05-29 DIAGNOSIS — Y998 Other external cause status: Secondary | ICD-10-CM | POA: Insufficient documentation

## 2015-05-29 DIAGNOSIS — F419 Anxiety disorder, unspecified: Secondary | ICD-10-CM | POA: Insufficient documentation

## 2015-05-29 DIAGNOSIS — Y9289 Other specified places as the place of occurrence of the external cause: Secondary | ICD-10-CM | POA: Insufficient documentation

## 2015-05-29 DIAGNOSIS — G8929 Other chronic pain: Secondary | ICD-10-CM | POA: Diagnosis not present

## 2015-05-29 DIAGNOSIS — F329 Major depressive disorder, single episode, unspecified: Secondary | ICD-10-CM | POA: Insufficient documentation

## 2015-05-29 HISTORY — PX: ESOPHAGOSCOPY WITH DILITATION: SHX5618

## 2015-05-29 SURGERY — ESOPHAGOSCOPY, WITH DILATION
Anesthesia: General | Site: Mouth

## 2015-05-29 MED ORDER — MIDAZOLAM HCL 2 MG/2ML IJ SOLN
INTRAMUSCULAR | Status: AC
  Filled 2015-05-29: qty 2

## 2015-05-29 MED ORDER — FENTANYL CITRATE (PF) 250 MCG/5ML IJ SOLN
INTRAMUSCULAR | Status: DC | PRN
  Administered 2015-05-29: 50 ug via INTRAVENOUS

## 2015-05-29 MED ORDER — FENTANYL CITRATE (PF) 100 MCG/2ML IJ SOLN
25.0000 ug | INTRAMUSCULAR | Status: DC | PRN
  Administered 2015-05-29 (×3): 25 ug via INTRAVENOUS

## 2015-05-29 MED ORDER — FENTANYL CITRATE (PF) 250 MCG/5ML IJ SOLN
INTRAMUSCULAR | Status: AC
  Filled 2015-05-29: qty 5

## 2015-05-29 MED ORDER — DEXAMETHASONE SODIUM PHOSPHATE 10 MG/ML IJ SOLN
10.0000 mg | Freq: Once | INTRAMUSCULAR | Status: DC
  Filled 2015-05-29: qty 1

## 2015-05-29 MED ORDER — LACTATED RINGERS IV SOLN
INTRAVENOUS | Status: DC
  Administered 2015-05-29: 09:00:00 via INTRAVENOUS

## 2015-05-29 MED ORDER — PROMETHAZINE HCL 25 MG/ML IJ SOLN: 12.5000 mg | Freq: Once | INTRAMUSCULAR | Status: DC | PRN

## 2015-05-29 MED ORDER — CEFAZOLIN SODIUM-DEXTROSE 2-3 GM-% IV SOLR: 2.0000 g | INTRAVENOUS | Status: DC

## 2015-05-29 MED ORDER — PROPOFOL 10 MG/ML IV BOLUS
INTRAVENOUS | Status: AC
  Filled 2015-05-29: qty 20

## 2015-05-29 MED ORDER — HYDROCODONE-ACETAMINOPHEN 7.5-325 MG/15ML PO SOLN: 5.0000 mL | Freq: Four times a day (QID) | ORAL | Status: DC | PRN

## 2015-05-29 MED ORDER — LIDOCAINE HCL (CARDIAC) 20 MG/ML IV SOLN
INTRAVENOUS | Status: AC
  Filled 2015-05-29: qty 5

## 2015-05-29 MED ORDER — FENTANYL CITRATE (PF) 100 MCG/2ML IJ SOLN
INTRAMUSCULAR | Status: AC
  Filled 2015-05-29: qty 2

## 2015-05-29 MED ORDER — CEFAZOLIN SODIUM-DEXTROSE 2-3 GM-% IV SOLR
2.0000 g | INTRAVENOUS | Status: AC
  Administered 2015-05-29: 2 g via INTRAVENOUS
  Filled 2015-05-29: qty 50

## 2015-05-29 MED ORDER — ONDANSETRON HCL 4 MG/2ML IJ SOLN
INTRAMUSCULAR | Status: DC | PRN
  Administered 2015-05-29: 4 mg via INTRAVENOUS

## 2015-05-29 MED ORDER — ONDANSETRON HCL 4 MG/2ML IJ SOLN
INTRAMUSCULAR | Status: AC
  Filled 2015-05-29: qty 2

## 2015-05-29 MED ORDER — MIDAZOLAM HCL 5 MG/5ML IJ SOLN
INTRAMUSCULAR | Status: DC | PRN
  Administered 2015-05-29: 2 mg via INTRAVENOUS

## 2015-05-29 MED ORDER — PROPOFOL 10 MG/ML IV BOLUS
INTRAVENOUS | Status: DC | PRN
  Administered 2015-05-29: 50 mg via INTRAVENOUS
  Administered 2015-05-29: 150 mg via INTRAVENOUS

## 2015-05-29 SURGICAL SUPPLY — 27 items
BALLN PULM 15 16.5 18X75 (BALLOONS)
BALLOON PULM 15 16.5 18X75 (BALLOONS) IMPLANT
BLADE SURG 15 STRL LF DISP TIS (BLADE) IMPLANT
BLADE SURG 15 STRL SS (BLADE)
CANISTER SUCTION 2500CC (MISCELLANEOUS) ×2 IMPLANT
COVER TABLE BACK 60X90 (DRAPES) ×2 IMPLANT
DRAPE PROXIMA HALF (DRAPES) ×2 IMPLANT
GAUZE SPONGE 4X4 12PLY STRL (GAUZE/BANDAGES/DRESSINGS) ×2 IMPLANT
GLOVE ECLIPSE 7.5 STRL STRAW (GLOVE) ×2 IMPLANT
GUARD TEETH (MISCELLANEOUS) IMPLANT
H R LUBE JELLY XXX (MISCELLANEOUS) ×2 IMPLANT
KIT BASIN OR (CUSTOM PROCEDURE TRAY) ×2 IMPLANT
KIT ROOM TURNOVER OR (KITS) ×2 IMPLANT
MARKER SKIN DUAL TIP RULER LAB (MISCELLANEOUS) IMPLANT
NDL HYPO 25GX1X1/2 BEV (NEEDLE) IMPLANT
NEEDLE HYPO 25GX1X1/2 BEV (NEEDLE) IMPLANT
NEEDLE INJECT RIGID (NEEDLE) IMPLANT
NEEDLE INJECT RIGID 21GA 14.6 (NEEDLE) IMPLANT
NS IRRIG 1000ML POUR BTL (IV SOLUTION) ×1 IMPLANT
PAD ARMBOARD 7.5X6 YLW CONV (MISCELLANEOUS) ×4 IMPLANT
PATTIES SURGICAL .5 X3 (DISPOSABLE) IMPLANT
SPECIMEN JAR SMALL (MISCELLANEOUS) IMPLANT
SYR CONTROL 10ML LL (SYRINGE) IMPLANT
SYR TB 1ML LUER SLIP (SYRINGE) IMPLANT
TOWEL OR 17X24 6PK STRL BLUE (TOWEL DISPOSABLE) ×2 IMPLANT
TUBE CONNECTING 12X1/4 (SUCTIONS) ×2 IMPLANT
WATER STERILE IRR 1000ML POUR (IV SOLUTION) ×1 IMPLANT

## 2015-05-29 NOTE — H&P (Signed)
[name] is an 69 y.o. female.   Chief Complaint: Dysphagia HPI: Hx of laryngeal Ca, s/p total laryngectomy and ND's in 9/13. S/p chemoxrt. Progressive dysphagia - pt s/p dilation 6/15 with improvement.  Past Medical History  Diagnosis Date  . Hypercholesteremia     takes Pravastatin daily  . Hiatal hernia 08/10/2012  . Hypertension     takes Tribenzor and Atenolol daily  . Headache(784.0)   . GERD (gastroesophageal reflux disease)     takes Zantac daily  . Chronic back pain   . Constipation     related to pain meds  . Nausea     takes Zofran prn  . Anxiety     takes Ativan prn  . Depression   . Insomnia     takes Amitriptyline daily  . Broken ribs   . Blood transfusion without reported diagnosis 09/15/12    2 units Prbc's  . Gastrostomy in place     removed  . History of radiation therapy 10/17/12-11/25/12    supraglottic larynx,high risk neck tumor bed 5880 cGy/28 sessions, high risk lymph node tumor bed 5600 cGy/20 sessions, mod risk lymph node tumor bed 5040 cGy/20 sessions  . COPD (chronic obstructive pulmonary disease) 08/10/2012    denies  . Pneumonia   . Stroke 2011    denies. no residual  . Shortness of breath dyspnea   . Anginal pain     ?  . Uterine cancer   . 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 Abdominal surgery      r/t uterine carcinoma  . Tracheostomy tube placement  08/10/2012    Procedure: TRACHEOSTOMY;  Surgeon: Jerrell Belfast, MD;  Location: [facility];  Service: ENT;  Laterality: N/A;  . Laryngoscopy  08/10/2012    Procedure: LARYNGOSCOPY;  Surgeon: Jerrell Belfast, MD;  Location: [facility];  Service: ENT;  Laterality: N/A;  with biopsy  . Appendectomy    . Gastrostomy w/ feeding tube  13  . Laryngetomy  08/31/2012    Procedure: LARYNGECTOMY;  Surgeon: Jerrell Belfast, MD;  Location: [facility];  Service: ENT;  Laterality: N/A;  . Radical neck dissection  08/31/2012   Procedure: RADICAL NECK DISSECTION;  Surgeon: Jerrell Belfast, MD;  Location: [facility];  Service: ENT;  Laterality: Bilateral;  . Hernia repair      child  . Tracheal esophogeal puncture with repair stoma N/A 09/08/2013    Procedure: TRACHEAL ESOPHOGEAL PUNCTURE WITH PLACEMENT OF  PROVOX PROSTHESIS ;  Surgeon: Jerrell Belfast, MD;  Location: [facility];  Service: ENT;  Laterality: N/A;  . Gastrotomy    . Gastrostomy tube removed   2013  . Direct laryngoscopy N/A 05/22/2014    Procedure: DIRECT LARYNGOSCOPY WITH ESOPHAGEAL DILATION;  Surgeon: Jerrell Belfast, MD;  Location: [facility];  Service: ENT;  Laterality: N/A;    No family history on file. Social History:  reports that she quit smoking about 2 years ago. Her smoking use included Cigarettes. She has a 12.5 pack-year smoking history. She has never used smokeless tobacco. She reports that she does not drink alcohol or use illicit drugs.  Allergies: No Known Allergies  Medications Prior to Admission  Medication Sig Dispense Refill  . atenolol (TENORMIN) 25 MG tablet Take 12.5 mg by mouth 2 (two) times daily.     . cetirizine (ZYRTEC) 10 MG tablet Take 10 mg by mouth daily.  5  . FLUoxetine (PROZAC) 40  MG capsule Take 40 mg by mouth daily.    [name] HYDROcodone-acetaminophen (NORCO/VICODIN) 5-325 MG per tablet Take 1 tablet by mouth every 6 (six) hours as needed for moderate pain.    [name] LORazepam (ATIVAN) 1 MG tablet Take 1 mg by mouth daily.    . Olmesartan-Amlodipine-HCTZ (TRIBENZOR) 20-5-12.5 MG TABS Take 1 tablet by mouth daily.    [name] omeprazole (PRILOSEC) 20 MG capsule Take 20 mg by mouth daily.  3  . rosuvastatin (CRESTOR) 20 MG tablet Take 20 mg by mouth daily.    . traZODone (DESYREL) 100 MG tablet Take 100 mg by mouth at bedtime.  3  . traZODone (DESYREL) 50 MG tablet Take 50 mg by mouth daily as needed for sleep.       Results for orders placed or performed during the hospital encounter of 05/28/15 (from the past 48 hour(s))  Basic metabolic  panel     Status: Abnormal   Collection Time: 05/28/15 10:44 AM  Result Value Ref Range   Sodium 140 135 - 145 mmol/L   Potassium 4.9 3.5 - 5.1 mmol/L   Chloride 108 101 - 111 mmol/L   CO2 23 22 - 32 mmol/L   Glucose, Bld 108 (H) 65 - 99 mg/dL   BUN 30 (H) 6 - 20 mg/dL   Creatinine, Ser 1.52 (H) 0.44 - 1.00 mg/dL   Calcium 9.5 8.9 - 10.3 mg/dL   GFR calc non Af Amer 34 (L) >60 mL/min   GFR calc Af Amer 39 (L) >60 mL/min    Comment: (NOTE) The eGFR has been calculated using the CKD EPI equation. This calculation has not been validated in all clinical situations. eGFR's persistently <60 mL/min signify possible Chronic Kidney Disease.    Anion gap 9 5 - 15  CBC     Status: Abnormal   Collection Time: 05/28/15 10:44 AM  Result Value Ref Range   WBC 7.0 4.0 - 10.5 K/uL   RBC 4.28 3.87 - 5.11 MIL/uL   Hemoglobin 11.7 (L) 12.0 - 15.0 g/dL   HCT 36.5 36.0 - 46.0 %   MCV 85.3 78.0 - 100.0 fL   MCH 27.3 26.0 - 34.0 pg   MCHC 32.1 30.0 - 36.0 g/dL   RDW 14.1 11.5 - 15.5 %   Platelets 218 150 - 400 K/uL   No results found.  Review of Systems  Constitutional: Negative.   HENT: Negative.   Respiratory: Negative.   Cardiovascular: Negative.   Gastrointestinal: Negative.     Blood pressure 162/70, pulse 65, temperature 98.6 F (37 C), temperature source Oral, resp. rate 20, weight 59.875 kg (132 lb), SpO2 96 %. Physical Exam  Constitutional: She is oriented to person, place, and time. She appears well-developed and well-nourished.  Neck: Normal range of motion. Neck supple.  S/p laryngectomy  Cardiovascular: Normal rate.   Respiratory: Effort normal.  GI: Soft.  Neurological: She is alert and oriented to person, place, and time.     Assessment/Plan Adm for OP esophagoscopy and dilation.  [place], Hibba Schram 05/29/2015, 8:38 AM

## 2015-05-29 NOTE — Anesthesia Preprocedure Evaluation (Signed)
Anesthesia Evaluation  Patient identified by MRN, date of birth, ID band Patient awake    Airway Mallampati: Trach   Neck ROM: Full    Dental   Pulmonary shortness of breath, COPDformer smoker,  breath sounds clear to auscultation+ rhonchi         Cardiovascular hypertension, + angina Rhythm:Regular Rate:Normal     Neuro/Psych  Headaches, CVA    GI/Hepatic hiatal hernia, GERD-  ,  Endo/Other    Renal/GU Renal InsufficiencyRenal disease     Musculoskeletal   Abdominal   Peds  Hematology   Anesthesia Other Findings   Reproductive/Obstetrics                             Anesthesia Physical Anesthesia Plan  ASA: III  Anesthesia Plan: General   Post-op Pain Management:    Induction: Intravenous  Airway Management Planned: Tracheostomy  Additional Equipment:   Intra-op Plan:   Post-operative Plan:   Informed Consent: I have reviewed the patients History and Physical, chart, labs and discussed the procedure including the risks, benefits and alternatives for the proposed anesthesia with the patient or authorized representative who has indicated his/her understanding and acceptance.     Plan Discussed with: CRNA and Surgeon  Anesthesia Plan Comments:         Anesthesia Quick Evaluation

## 2015-05-29 NOTE — Anesthesia Procedure Notes (Signed)
Procedure Name: Intubation Date/Time: 05/29/2015 10:04 AM Performed by: Julian Reil Pre-anesthesia Checklist: Patient identified, Emergency Drugs available, Suction available and Patient being monitored Patient Re-evaluated:Patient Re-evaluated prior to inductionOxygen Delivery Method: Circle system utilized Preoxygenation: Pre-oxygenation with 100% oxygen Intubation Type: IV induction Tube size: 6.0 mm Number of attempts: 1 Placement Confirmation: positive ETCO2 and breath sounds checked- equal and bilateral Tube secured with: Tape Comments: Patient with laryngectomy and stoma. Inserted lubricated 6.0 ETT through stoma without resistance after IV induction. +EtCO2, +BBS.

## 2015-05-29 NOTE — Brief Op Note (Signed)
05/29/2015  10:33 AM  PATIENT:  [name]  69 y.o. female  PRE-OPERATIVE DIAGNOSIS:  History of  larynx cancer, Dysphagia  POST-OPERATIVE DIAGNOSIS:  History of  larynx cancer, Dysphagia  PROCEDURE:  Procedure(s): ESOPHAGOSCOPY WITH ESOPHAGEAL DILITATION (N/A)  SURGEON:  Surgeon(s) and Role:    * Jerrell Belfast, MD - Primary  PHYSICIAN ASSISTANT:   ASSISTANTS: none   ANESTHESIA:   general  EBL:  Total I/O In: 600 [I.V.:600] Out: 5 [Blood:5]  BLOOD ADMINISTERED:none  DRAINS: none   LOCAL MEDICATIONS USED:  NONE  SPECIMEN:  No Specimen  DISPOSITION OF SPECIMEN:  N/A  COUNTS:  YES  TOURNIQUET:  * No tourniquets in log *  DICTATION: .Other Dictation: Dictation Number [id number]  PLAN OF CARE: Discharge to home after PACU  PATIENT DISPOSITION:  PACU - hemodynamically stable.   Delay start of Pharmacological VTE agent (>24hrs) due to surgical blood loss or risk of bleeding: not applicable

## 2015-05-29 NOTE — Transfer of Care (Signed)
Immediate Anesthesia Transfer of Care Note  Patient: [name]  Procedure(s) Performed: Procedure(s): ESOPHAGOSCOPY WITH ESOPHAGEAL DILITATION (N/A)  Patient Location: PACU  Anesthesia Type:General  Level of Consciousness: awake, alert , oriented and patient cooperative  Airway & Oxygen Therapy: Patient Spontanous Breathing and Patient connected to tracheostomy mask oxygen  Post-op Assessment: Report given to RN, Post -op Vital signs reviewed and stable and Patient moving all extremities  Post vital signs: Reviewed and stable  Last Vitals:  Filed Vitals:   05/29/15 0825  BP: 162/70  Pulse: 65  Temp: 37 C  Resp: 20    Complications: No apparent anesthesia complications

## 2015-05-29 NOTE — Anesthesia Postprocedure Evaluation (Signed)
  Anesthesia Post-op Note  Patient: [name]  Procedure(s) Performed: Procedure(s): ESOPHAGOSCOPY WITH ESOPHAGEAL DILITATION (N/A)  Patient Location: PACU  Anesthesia Type:General  Level of Consciousness: awake and alert   Airway and Oxygen Therapy: Patient Spontanous Breathing  Post-op Pain: mild  Post-op Assessment: Post-op Vital signs reviewed              Post-op Vital Signs: stable  Last Vitals:  Filed Vitals:   05/29/15 1102  BP: 145/68  Pulse: 60  Temp:   Resp: 11    Complications: No apparent anesthesia complications

## 2015-05-30 NOTE — Op Note (Signed)
[name], [name]              ACCOUNT NO.:  [id number]  MEDICAL RECORD NO.:  [id number]  LOCATION:  [facility]                         FACILITY:  [facility]  PHYSICIAN:  Early Chars. [name], M.D.DATE OF BIRTH:  1946/10/15  DATE OF PROCEDURE:  05/29/2015 DATE OF DISCHARGE:  05/29/2015                              OPERATIVE REPORT   PREOPERATIVE DIAGNOSIS: 1. Progressive dysphagia. 2. Status post total laryngectomy with neck dissections for squamous     cell carcinoma performed in September 2013. 3. Status post chemo and radiation therapy. 4. Radiation-induced esophageal stricture.  POSTOPERATIVE DIAGNOSIS: 1. Progressive dysphagia. 2. Status post total laryngectomy with neck dissections for squamous     cell carcinoma performed in September 2013. 3. Status post chemo and radiation therapy. 4. Radiation-induced esophageal stricture.  PROCEDURE:  Esophagoscopy and dilation.  ANESTHESIA:  General endotracheal.  COMPLICATIONS:  No complications.  BLOOD LOSS:  Minimal.  DISPOSITION:  The patient was transferred from the operating room to the recovery room in stable condition.  BRIEF HISTORY:  The patient is a 69 year old female, who has been followed with a history of squamous cell carcinoma of the supraglottis. She was seen on emergency basis in September of 2013 at [facility] with progressive symptoms of dysphagia and airway obstruction diagnosed with advanced laryngeal squamous cell carcinoma.  The patient required a total laryngectomy and bilateral neck dissections, subsequent chemo and radiation therapy for definitive management of her disease. Now 2-1/2 years out, the patient is stable.  No evidence of recurrent disease.  She has undergone a tracheoesophageal puncture with placement of a voice prosthesis.  She has had intermittent symptoms of progressive dysphagia; and in June of 2015, underwent esophagoscopy with dilation of the cervical esophagus which improved her  swallowing significantly.  She is followed on a consistent regular basis and reports increasing symptoms of difficulty swallowing with a sensation of tightness and fullness.  She underwent a barium swallow study performed at [facility] which showed expected postsurgical changes.  No evidence of mass or tumor with some narrowing at the region of the esophageal introitus.  Given her history and findings, I recommended to undertake esophagoscopy with dilation of esophageal stricture.  The risks and benefits of the procedures were discussed in detail with the patient and her family, and they understood and concurred with our plan for surgery which was scheduled on elective basis at the [facility].  DESCRIPTION OF PROCEDURE:  The patient was brought to the operating room and placed in supine position on the operating table.  General endotracheal anesthesia was established without difficulty via the patient's existing laryngostoma.  The patient's voice prosthesis was in place and was maintained throughout the surgical procedure.  A surgical time-out was performed with identification of the patient and the proposed surgical procedure.  The patient was then prepped and draped and prepared for surgery.  Using a cervical esophagoscope, the patient's oral cavity, oropharynx, and esophageal introitus were examined.  There was no evidence of mass, tumor, or ulcer.  There was a significant narrowing of the esophageal introitus at the junction of the previous surgical resection.  Again, no evidence of tumor or bleeding.  The  esophageal dilators were then used, and the patient was sequentially dilated from 22 to 38-French without difficulty.  The dilators were removed independently and then cervical esophagoscopy was performed again.  The cervical esophagoscope was then advanced beyond the area of stricture into the upper esophagus, and again there was no evidence of lesion  or mass.  The esophageal aspect of the voice prosthesis was visualized and identified, it was in good position.  There were no other abnormal findings.  Cervical esophagoscope passed freely through the area of stricture after the above dilation.  The patient was then awakened from anesthetic, and she was transferred from the operating room to the recovery room in stable condition.  There were no complications, and blood loss was minimal.          ______________________________ Early Chars. [name], M.D.     DLS/MEDQ  D:  [id number]  T:  05/30/2015  Job:  [id number]

## 2015-05-31 ENCOUNTER — Encounter (HOSPITAL_COMMUNITY): Payer: Self-pay | Admitting: Otolaryngology

## 2015-06-24 ENCOUNTER — Telehealth: Payer: Self-pay | Admitting: *Deleted

## 2015-06-24 NOTE — Telephone Encounter (Signed)
  Oncology Nurse Navigator Documentation   Navigator Encounter Type: Telephone (06/24/15 1134)   Received VM from Dr. Jerrell Belfast, [facility], re: availability of emotional/counseling support for mutual patient.  (She was treated by Dr. Valere Dross, last seen by him 03/06/14.)  I LVM with his RN Leafy Ro that I received his office note, will get back to him with follow-up.   Gayleen Orem, RN, BSN, [facility] [place] [phone]

## 2015-07-17 DIAGNOSIS — K222 Esophageal obstruction: Secondary | ICD-10-CM | POA: Insufficient documentation

## 2015-08-15 ENCOUNTER — Other Ambulatory Visit (HOSPITAL_COMMUNITY): Payer: Self-pay | Admitting: Family Medicine

## 2015-08-15 DIAGNOSIS — I739 Peripheral vascular disease, unspecified: Secondary | ICD-10-CM

## 2015-08-21 DIAGNOSIS — Q387 Congenital pharyngeal pouch: Secondary | ICD-10-CM | POA: Insufficient documentation

## 2015-08-23 ENCOUNTER — Inpatient Hospital Stay (HOSPITAL_COMMUNITY): Admission: RE | Admit: 2015-08-23 | Payer: Self-pay | Source: Ambulatory Visit

## 2015-10-29 ENCOUNTER — Encounter (HOSPITAL_COMMUNITY): Payer: Self-pay | Admitting: *Deleted

## 2015-10-29 ENCOUNTER — Emergency Department (HOSPITAL_COMMUNITY): Payer: Medicare Other

## 2015-10-29 ENCOUNTER — Emergency Department (HOSPITAL_COMMUNITY)
Admission: EM | Admit: 2015-10-29 | Discharge: 2015-10-30 | Disposition: A | Discharge status: Home / Self Care | Payer: Medicare Other | Attending: Emergency Medicine | Admitting: Emergency Medicine

## 2015-10-29 DIAGNOSIS — I10 Essential (primary) hypertension: Secondary | ICD-10-CM | POA: Insufficient documentation

## 2015-10-29 DIAGNOSIS — S199XXA Unspecified injury of neck, initial encounter: Secondary | ICD-10-CM | POA: Diagnosis not present

## 2015-10-29 DIAGNOSIS — Z79899 Other long term (current) drug therapy: Secondary | ICD-10-CM | POA: Insufficient documentation

## 2015-10-29 DIAGNOSIS — Y998 Other external cause status: Secondary | ICD-10-CM | POA: Diagnosis not present

## 2015-10-29 DIAGNOSIS — S8992XA Unspecified injury of left lower leg, initial encounter: Secondary | ICD-10-CM | POA: Diagnosis not present

## 2015-10-29 DIAGNOSIS — Z8542 Personal history of malignant neoplasm of other parts of uterus: Secondary | ICD-10-CM | POA: Diagnosis not present

## 2015-10-29 DIAGNOSIS — F419 Anxiety disorder, unspecified: Secondary | ICD-10-CM | POA: Insufficient documentation

## 2015-10-29 DIAGNOSIS — Y9389 Activity, other specified: Secondary | ICD-10-CM | POA: Diagnosis not present

## 2015-10-29 DIAGNOSIS — T148XXA Other injury of unspecified body region, initial encounter: Secondary | ICD-10-CM

## 2015-10-29 DIAGNOSIS — S8991XA Unspecified injury of right lower leg, initial encounter: Secondary | ICD-10-CM | POA: Insufficient documentation

## 2015-10-29 DIAGNOSIS — W01198A Fall on same level from slipping, tripping and stumbling with subsequent striking against other object, initial encounter: Secondary | ICD-10-CM | POA: Diagnosis not present

## 2015-10-29 DIAGNOSIS — J449 Chronic obstructive pulmonary disease, unspecified: Secondary | ICD-10-CM | POA: Insufficient documentation

## 2015-10-29 DIAGNOSIS — K219 Gastro-esophageal reflux disease without esophagitis: Secondary | ICD-10-CM | POA: Insufficient documentation

## 2015-10-29 DIAGNOSIS — G4700 Insomnia, unspecified: Secondary | ICD-10-CM | POA: Diagnosis not present

## 2015-10-29 DIAGNOSIS — Z8673 Personal history of transient ischemic attack (TIA), and cerebral infarction without residual deficits: Secondary | ICD-10-CM | POA: Insufficient documentation

## 2015-10-29 DIAGNOSIS — S0993XA Unspecified injury of face, initial encounter: Secondary | ICD-10-CM | POA: Diagnosis present

## 2015-10-29 DIAGNOSIS — E7800 Pure hypercholesterolemia, unspecified: Secondary | ICD-10-CM | POA: Diagnosis not present

## 2015-10-29 DIAGNOSIS — Z87891 Personal history of nicotine dependence: Secondary | ICD-10-CM | POA: Insufficient documentation

## 2015-10-29 DIAGNOSIS — R32 Unspecified urinary incontinence: Secondary | ICD-10-CM | POA: Insufficient documentation

## 2015-10-29 DIAGNOSIS — G8929 Other chronic pain: Secondary | ICD-10-CM | POA: Diagnosis not present

## 2015-10-29 DIAGNOSIS — S0012XA Contusion of left eyelid and periocular area, initial encounter: Secondary | ICD-10-CM | POA: Diagnosis not present

## 2015-10-29 DIAGNOSIS — R410 Disorientation, unspecified: Secondary | ICD-10-CM

## 2015-10-29 DIAGNOSIS — Z8701 Personal history of pneumonia (recurrent): Secondary | ICD-10-CM | POA: Diagnosis not present

## 2015-10-29 DIAGNOSIS — Y92009 Unspecified place in unspecified non-institutional (private) residence as the place of occurrence of the external cause: Secondary | ICD-10-CM | POA: Diagnosis not present

## 2015-10-29 DIAGNOSIS — Z85828 Personal history of other malignant neoplasm of skin: Secondary | ICD-10-CM | POA: Insufficient documentation

## 2015-10-29 DIAGNOSIS — F329 Major depressive disorder, single episode, unspecified: Secondary | ICD-10-CM | POA: Diagnosis not present

## 2015-10-29 LAB — CBC WITH DIFFERENTIAL/PLATELET
Basophils Absolute: 0 10*3/uL (ref 0.0–0.1)
Basophils Relative: 0 %
EOS ABS: 0.1 10*3/uL (ref 0.0–0.7)
EOS PCT: 1 %
HEMATOCRIT: 36.1 % (ref 36.0–46.0)
HEMOGLOBIN: 11.6 g/dL — AB (ref 12.0–15.0)
Lymphocytes Relative: 18 %
Lymphs Abs: 1.2 10*3/uL (ref 0.7–4.0)
MCH: 27.6 pg (ref 26.0–34.0)
MCHC: 32.1 g/dL (ref 30.0–36.0)
MCV: 86 fL (ref 78.0–100.0)
Monocytes Absolute: 0.6 10*3/uL (ref 0.1–1.0)
Monocytes Relative: 9 %
Neutro Abs: 4.8 10*3/uL (ref 1.7–7.7)
Neutrophils Relative %: 72 %
PLATELETS: 212 10*3/uL (ref 150–400)
RBC: 4.2 MIL/uL (ref 3.87–5.11)
RDW: 14 % (ref 11.5–15.5)
WBC: 6.7 10*3/uL (ref 4.0–10.5)

## 2015-10-29 LAB — I-STAT CG4 LACTIC ACID, ED: Lactic Acid, Venous: 1.99 mmol/L (ref 0.5–2.0)

## 2015-10-29 LAB — I-STAT TROPONIN, ED: Troponin i, poc: 0.01 ng/mL (ref 0.00–0.08)

## 2015-10-29 NOTE — ED Notes (Signed)
Bed: WA02 Expected date:  Expected time:  Means of arrival:  Comments: EMS 68yo headache / eye pain/ fall several days ago and tonight

## 2015-10-29 NOTE — ED Notes (Signed)
EMS was called to pt's home after pt fell from bed at 2200. Fall was unwitnessed with new onset pain of neck pain. 3 days ago pt fell and hit her left eye into an end table. Pt has complained of headache since that fall and left eye with notable ecchymosis. Pt's family reported episode of bowel and bladder incontienence today that is not pt's baseline. Pt with reported depression and EMS noted pt to be tearful in route to the hospital.

## 2015-10-29 NOTE — ED Provider Notes (Signed)
CSN: [id number]     Arrival date & time 10/29/15  2254 History  By signing my name below, I, Eustaquio Maize, attest that this documentation has been prepared under the direction and in the presence of Orpah Greek, MD. Electronically Signed: Eustaquio Maize, ED Scribe. 10/29/2015. 11:48 PM.  Chief Complaint  Patient presents with  . Fall   The history is provided by the patient and a relative. No language interpreter was used.     HPI Comments: [name] is a 69 y.o. female brought in by ambulance, who presents to the Emergency Department complaining of gradual onset neck pain s/p ground level fall that occurred tonight. Family reports that a couple of days ago pt had an unwitnessed fall. Family came home around 6 PM and noticed that pt had bruising to her right eye. Pt relayed to family that she tripped on a crayon and fell and hit her head on the table but told them she felt fine. Pt was not dizzy or nauseous at the time, prompting family to call it an accident. They report that tonight around 10 PM they heard a noise from pt's bedroom and went to check up on her. Pt was getting up from the floor and told them that she fell again. Family states that pt was stumbling while walking to the bed after the fall. When pt was laying in her bed she complained of chills and bilateral leg pain. When family went to remove the blanket to check on her legs, they report that she had an episode of urine and bowel incontinence which is not usual for pt. Family went to clean pt up and report afterwards pt was looking out into the hallway and calling out her deceased husbands name. Family reports that pt was seeing her deceased husband and was crying for him to take her away. Pt is still complaining of diffuse bilateral leg pain but family reports that pt has complained about this in the past. Family denies any new onset cough or breathing changes. They state that family has been acting normally until  tonight.   Past Medical History  Diagnosis Date  . Hypercholesteremia     takes Pravastatin daily  . Hiatal hernia 08/10/2012  . Hypertension     takes Tribenzor and Atenolol daily  . Headache(784.0)   . GERD (gastroesophageal reflux disease)     takes Zantac daily  . Chronic back pain   . Constipation     related to pain meds  . Nausea     takes Zofran prn  . Anxiety     takes Ativan prn  . Depression   . Insomnia     takes Amitriptyline daily  . Broken ribs   . Blood transfusion without reported diagnosis 09/15/12    2 units Prbc's  . Gastrostomy in place [facility])     removed  . History of radiation therapy 10/17/12-11/25/12    supraglottic larynx,high risk neck tumor bed 5880 cGy/28 sessions, high risk lymph node tumor bed 5600 cGy/20 sessions, mod risk lymph node tumor bed 5040 cGy/20 sessions  . COPD (chronic obstructive pulmonary disease) ([place]) 08/10/2012    denies  . Pneumonia   . Stroke [facility]) 2011    denies. no residual  . Shortness of breath dyspnea   . Anginal pain ([place])     ?  [name] Uterine cancer ([place])   . 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 08/08/2012   Past Surgical History  Procedure Laterality  Date  . Abdominal surgery      r/t uterine carcinoma  . Tracheostomy tube placement  08/10/2012    Procedure: TRACHEOSTOMY;  Surgeon: Jerrell Belfast, MD;  Location: [facility];  Service: ENT;  Laterality: N/A;  . Laryngoscopy  08/10/2012    Procedure: LARYNGOSCOPY;  Surgeon: Jerrell Belfast, MD;  Location: [facility];  Service: ENT;  Laterality: N/A;  with biopsy  . Appendectomy    . Gastrostomy w/ feeding tube  13  . Laryngetomy  08/31/2012    Procedure: LARYNGECTOMY;  Surgeon: Jerrell Belfast, MD;  Location: [place];  Service: ENT;  Laterality: N/A;  . Radical neck dissection  08/31/2012    Procedure: RADICAL NECK DISSECTION;  Surgeon: Jerrell Belfast, MD;  Location: [place];  Service: ENT;  Laterality: Bilateral;  . Hernia repair      child  . Tracheal esophogeal  puncture with repair stoma N/A 09/08/2013    Procedure: TRACHEAL ESOPHOGEAL PUNCTURE WITH PLACEMENT OF  PROVOX PROSTHESIS ;  Surgeon: Jerrell Belfast, MD;  Location: [place];  Service: ENT;  Laterality: N/A;  . Gastrotomy    . Gastrostomy tube removed   2013  . Direct laryngoscopy N/A 05/22/2014    Procedure: DIRECT LARYNGOSCOPY WITH ESOPHAGEAL DILATION;  Surgeon: Jerrell Belfast, MD;  Location: [place];  Service: ENT;  Laterality: N/A;  . Esophagoscopy with dilitation N/A 05/29/2015    Procedure: ESOPHAGOSCOPY WITH ESOPHAGEAL DILITATION;  Surgeon: Jerrell Belfast, MD;  Location: [place];  Service: ENT;  Laterality: N/A;   History reviewed. No pertinent family history. Social History  Substance Use Topics  . Smoking status: Former Smoker -- 0.25 packs/day for 50 years    Types: Cigarettes    Quit date: 05/27/2013  . Smokeless tobacco: Never Used  . Alcohol Use: No   OB History    No data available     Review of Systems  Respiratory: Negative for cough and shortness of breath.   Gastrointestinal:       + Bowel incontinence  Genitourinary:       + Urinary incontinence  Musculoskeletal: Positive for arthralgias and neck pain.  All other systems reviewed and are negative.  Allergies  Review of patient's allergies indicates no known allergies.  Home Medications   Prior to Admission medications   Medication Sig Start Date End Date Taking? Authorizing Provider  atenolol (TENORMIN) 25 MG tablet Take 12.5 mg by mouth 2 (two) times daily.  01/13/13  Yes Historical Provider, MD  cetirizine (ZYRTEC) 10 MG tablet Take 10 mg by mouth daily. 05/13/15  Yes Historical Provider, MD  escitalopram (LEXAPRO) 20 MG tablet Take 20 mg by mouth daily.   Yes Historical Provider, MD  lisinopril-hydrochlorothiazide (PRINZIDE,ZESTORETIC) 20-25 MG tablet Take 1 tablet by mouth daily.   Yes Historical Provider, MD  LORazepam (ATIVAN) 1 MG tablet Take 1 mg by mouth daily. 09/14/12  Yes Jerrell Belfast, MD   Olmesartan-Amlodipine-HCTZ [facility]) 20-5-12.5 MG TABS Take 1 tablet by mouth daily.   Yes Historical Provider, MD  omeprazole (PRILOSEC) 20 MG capsule Take 40 mg by mouth daily.  05/06/15  Yes Historical Provider, MD  ondansetron (ZOFRAN) 8 MG tablet Take by mouth every 8 (eight) hours as needed for nausea or vomiting.   Yes Historical Provider, MD  rosuvastatin (CRESTOR) 20 MG tablet Take 20 mg by mouth daily.   Yes Historical Provider, MD  sucralfate (CARAFATE) 1 G tablet Take 1 g by mouth 4 (four) times daily.   Yes Historical Provider, MD  traZODone (  DESYREL) 100 MG tablet Take 100 mg by mouth at bedtime. 05/17/15  Yes Historical Provider, MD  HYDROcodone-acetaminophen (HYCET) 7.5-325 mg/15 ml solution Take 5-10 mLs by mouth every 6 (six) hours as needed for moderate pain. Patient not taking: Reported on 10/30/2015 05/29/15   Jerrell Belfast, MD   Triage Vitals: BP 186/80 mmHg  Pulse 72  Temp(Src) 98.3 F (36.8 C) (Oral)  Resp 18  SpO2 95%   Physical Exam  Constitutional: She is oriented to person, place, and time. She appears well-developed and well-nourished. No distress.  HENT:  Head: Normocephalic.  Right Ear: Hearing normal.  Left Ear: Hearing normal.  Nose: Nose normal.  Mouth/Throat: Oropharynx is clear and moist and mucous membranes are normal.  Eyes: Conjunctivae and EOM are normal. Pupils are equal, round, and reactive to light.  ecchymosis around left eye  Neck: Normal range of motion. Neck supple.  Soft tissue tenderness posterior neck  Cardiovascular: Regular rhythm, S1 normal and S2 normal.  Exam reveals no gallop and no friction rub.   No murmur heard. Pulmonary/Chest: Effort normal and breath sounds normal. No respiratory distress. She exhibits no tenderness.  Abdominal: Soft. Normal appearance and bowel sounds are normal. There is no hepatosplenomegaly. There is no tenderness. There is no rebound, no guarding, no tenderness at McBurney's point and negative  Murphy's sign. No hernia.  Musculoskeletal: Normal range of motion.  Neurological: She is alert and oriented to person, place, and time. She has normal strength. No cranial nerve deficit or sensory deficit. Coordination normal. GCS eye subscore is 4. GCS verbal subscore is 5. GCS motor subscore is 6.  Skin: Skin is warm, dry and intact. No rash noted. No cyanosis.  Psychiatric: She has a normal mood and affect. Her speech is normal and behavior is normal. Thought content normal.  Nursing note and vitals reviewed.   ED Course  Procedures (including critical care time)  DIAGNOSTIC STUDIES: Oxygen Saturation is 95% on RA, normal by my interpretation.    COORDINATION OF CARE: 11:13 PM-Discussed treatment plan which includes CT Head, CT C Spine, CT Maxillofacial, CXR, DG Hips bilaterally, Troponin, Lactic acid, CBC, BMP, UA with pt at bedside and pt agreed to plan.   Labs Review Labs Reviewed  CBC WITH DIFFERENTIAL/PLATELET - Abnormal; Notable for the following:    Hemoglobin 11.6 (*)    All other components within normal limits  BASIC METABOLIC PANEL - Abnormal; Notable for the following:    Glucose, Bld 100 (*)    Creatinine, Ser 1.31 (*)    GFR calc non Af Amer 41 (*)    GFR calc Af Amer 47 (*)    All other components within normal limits  URINALYSIS, ROUTINE W REFLEX MICROSCOPIC (NOT AT [facility]) - Abnormal; Notable for the following:    Hgb urine dipstick TRACE (*)    Protein, ur 100 (*)    Leukocytes, UA TRACE (*)    All other components within normal limits  URINE MICROSCOPIC-ADD ON - Abnormal; Notable for the following:    Squamous Epithelial / LPF 0-5 (*)    Bacteria, UA RARE (*)    All other components within normal limits  TROPONIN I  I-STAT CG4 LACTIC ACID, ED  Randolm Idol, ED    Imaging Review Dg Chest 2 View  10/30/2015  CLINICAL DATA:  Fall.  Multiple falls over several days. EXAM: CHEST  2 VIEW COMPARISON:  Chest radiograph 11/14/2014 FINDINGS: Heart is upper  limits normal in size, mild atherosclerosis of the  thoracic aorta. Mild chronic interstitial prominence and biapical pleural parenchymal scarring, unchanged. Pulmonary vasculature is normal. No consolidation, pleural effusion, or pneumothorax. No acute osseous abnormalities are seen. IMPRESSION: No acute pulmonary process. Electronically Signed   By: Jeb Levering M.D.   On: 10/30/2015 00:19   Ct Head Wo Contrast  10/30/2015  CLINICAL DATA:  Status post new unwitnessed fall from bed, with acute onset of neck pain. Prior fall 3 days ago, with left periorbital injury. Headache. Initial encounter. EXAM: CT HEAD WITHOUT CONTRAST CT MAXILLOFACIAL WITHOUT CONTRAST CT CERVICAL SPINE WITHOUT CONTRAST TECHNIQUE: Multidetector CT imaging of the head, cervical spine, and maxillofacial structures were performed using the standard protocol without intravenous contrast. Multiplanar CT image reconstructions of the cervical spine and maxillofacial structures were also generated. COMPARISON:  CT of the head performed 09/28/2013, and PET/CT performed 01/09/2015 FINDINGS: CT HEAD FINDINGS There is no evidence of acute infarction, mass lesion, or intra- or extra-axial hemorrhage on CT. The posterior fossa, including the cerebellum, brainstem and fourth ventricle, is within normal limits. The third and lateral ventricles, and basal ganglia are unremarkable in appearance. The cerebral hemispheres are symmetric in appearance, with normal gray-white differentiation. No mass effect or midline shift is seen. There is no evidence of fracture; visualized osseous structures are unremarkable in appearance. The visualized portions of the orbits are within normal limits. The paranasal sinuses and mastoid air cells are well-aerated. No significant soft tissue abnormalities are seen. CT MAXILLOFACIAL FINDINGS There is no evidence of fracture or dislocation. The maxilla and mandible appear intact. The nasal bone is unremarkable in appearance.  There is chronic absence of the dentition. The orbits are intact bilaterally. The visualized paranasal sinuses and mastoid air cells are well-aerated. Mild soft tissue injury is noted lateral to the left orbit. The parapharyngeal fat planes are preserved. The nasopharynx, oropharynx and hypopharynx are unremarkable in appearance. The visualized portions of the valleculae and piriform sinuses are grossly unremarkable. The parotid and submandibular glands are within normal limits. No cervical lymphadenopathy is seen. CT CERVICAL SPINE FINDINGS There is no evidence of fracture or subluxation. There is chronic loss of height at vertebral bodies C5 and C6, and disc space narrowing at C5-C7. Osseous fusion is noted at C7-T1. Associated anterior and posterior disc osteophyte complexes are noted along the lower cervical spine. Vertebral bodies demonstrate normal height and alignment. Prevertebral soft tissues are within normal limits. The thyroid gland is unremarkable in appearance. Dense scarring is noted at the lung apices. Dense calcification is noted at the carotid bifurcations bilaterally. A tracheostomy cap is noted. IMPRESSION: 1. No evidence of traumatic intracranial injury or fracture. 2. No evidence of fracture or dislocation with regard to the maxillofacial structures. 3. No evidence of fracture or subluxation along the cervical spine. 4. Mild soft tissue injury noted lateral to the left orbit. 5. Mild degenerative change along the lower cervical spine. Osseous fusion noted at C7-T1. 6. Dense scarring noted at the lung apices. 7. Dense calcification at the carotid bifurcations bilaterally. Carotid ultrasound could be considered for further evaluation, when and as deemed clinically appropriate. Electronically Signed   By: Garald Balding M.D.   On: 10/30/2015 00:40   Ct Cervical Spine Wo Contrast  10/30/2015  CLINICAL DATA:  Status post new unwitnessed fall from bed, with acute onset of neck pain. Prior fall 3  days ago, with left periorbital injury. Headache. Initial encounter. EXAM: CT HEAD WITHOUT CONTRAST CT MAXILLOFACIAL WITHOUT CONTRAST CT CERVICAL SPINE WITHOUT CONTRAST TECHNIQUE: Multidetector CT  imaging of the head, cervical spine, and maxillofacial structures were performed using the standard protocol without intravenous contrast. Multiplanar CT image reconstructions of the cervical spine and maxillofacial structures were also generated. COMPARISON:  CT of the head performed 09/28/2013, and PET/CT performed 01/09/2015 FINDINGS: CT HEAD FINDINGS There is no evidence of acute infarction, mass lesion, or intra- or extra-axial hemorrhage on CT. The posterior fossa, including the cerebellum, brainstem and fourth ventricle, is within normal limits. The third and lateral ventricles, and basal ganglia are unremarkable in appearance. The cerebral hemispheres are symmetric in appearance, with normal gray-white differentiation. No mass effect or midline shift is seen. There is no evidence of fracture; visualized osseous structures are unremarkable in appearance. The visualized portions of the orbits are within normal limits. The paranasal sinuses and mastoid air cells are well-aerated. No significant soft tissue abnormalities are seen. CT MAXILLOFACIAL FINDINGS There is no evidence of fracture or dislocation. The maxilla and mandible appear intact. The nasal bone is unremarkable in appearance. There is chronic absence of the dentition. The orbits are intact bilaterally. The visualized paranasal sinuses and mastoid air cells are well-aerated. Mild soft tissue injury is noted lateral to the left orbit. The parapharyngeal fat planes are preserved. The nasopharynx, oropharynx and hypopharynx are unremarkable in appearance. The visualized portions of the valleculae and piriform sinuses are grossly unremarkable. The parotid and submandibular glands are within normal limits. No cervical lymphadenopathy is seen. CT CERVICAL SPINE  FINDINGS There is no evidence of fracture or subluxation. There is chronic loss of height at vertebral bodies C5 and C6, and disc space narrowing at C5-C7. Osseous fusion is noted at C7-T1. Associated anterior and posterior disc osteophyte complexes are noted along the lower cervical spine. Vertebral bodies demonstrate normal height and alignment. Prevertebral soft tissues are within normal limits. The thyroid gland is unremarkable in appearance. Dense scarring is noted at the lung apices. Dense calcification is noted at the carotid bifurcations bilaterally. A tracheostomy cap is noted. IMPRESSION: 1. No evidence of traumatic intracranial injury or fracture. 2. No evidence of fracture or dislocation with regard to the maxillofacial structures. 3. No evidence of fracture or subluxation along the cervical spine. 4. Mild soft tissue injury noted lateral to the left orbit. 5. Mild degenerative change along the lower cervical spine. Osseous fusion noted at C7-T1. 6. Dense scarring noted at the lung apices. 7. Dense calcification at the carotid bifurcations bilaterally. Carotid ultrasound could be considered for further evaluation, when and as deemed clinically appropriate. Electronically Signed   By: Garald Balding M.D.   On: 10/30/2015 00:40   Dg Hips Bilat With Pelvis 2v  10/30/2015  CLINICAL DATA:  Status post fall, with subacute onset of bilateral hip pain. Initial encounter. EXAM: DG HIP (WITH OR WITHOUT PELVIS) 2V BILAT COMPARISON:  None. FINDINGS: There is no evidence of fracture or dislocation. Both femoral heads are seated normally within their respective acetabula. The proximal femurs appear intact. No significant degenerative change is appreciated. The sacroiliac joints are unremarkable in appearance. The visualized bowel gas pattern is grossly unremarkable in appearance. Scattered vascular calcifications are seen. IMPRESSION: No evidence of fracture or dislocation. Electronically Signed   By: Garald Balding M.D.   On: 10/30/2015 00:23   Ct Maxillofacial Wo Cm  10/30/2015  CLINICAL DATA:  Status post new unwitnessed fall from bed, with acute onset of neck pain. Prior fall 3 days ago, with left periorbital injury. Headache. Initial encounter. EXAM: CT HEAD WITHOUT CONTRAST CT MAXILLOFACIAL WITHOUT  CONTRAST CT CERVICAL SPINE WITHOUT CONTRAST TECHNIQUE: Multidetector CT imaging of the head, cervical spine, and maxillofacial structures were performed using the standard protocol without intravenous contrast. Multiplanar CT image reconstructions of the cervical spine and maxillofacial structures were also generated. COMPARISON:  CT of the head performed 09/28/2013, and PET/CT performed 01/09/2015 FINDINGS: CT HEAD FINDINGS There is no evidence of acute infarction, mass lesion, or intra- or extra-axial hemorrhage on CT. The posterior fossa, including the cerebellum, brainstem and fourth ventricle, is within normal limits. The third and lateral ventricles, and basal ganglia are unremarkable in appearance. The cerebral hemispheres are symmetric in appearance, with normal gray-white differentiation. No mass effect or midline shift is seen. There is no evidence of fracture; visualized osseous structures are unremarkable in appearance. The visualized portions of the orbits are within normal limits. The paranasal sinuses and mastoid air cells are well-aerated. No significant soft tissue abnormalities are seen. CT MAXILLOFACIAL FINDINGS There is no evidence of fracture or dislocation. The maxilla and mandible appear intact. The nasal bone is unremarkable in appearance. There is chronic absence of the dentition. The orbits are intact bilaterally. The visualized paranasal sinuses and mastoid air cells are well-aerated. Mild soft tissue injury is noted lateral to the left orbit. The parapharyngeal fat planes are preserved. The nasopharynx, oropharynx and hypopharynx are unremarkable in appearance. The visualized portions of  the valleculae and piriform sinuses are grossly unremarkable. The parotid and submandibular glands are within normal limits. No cervical lymphadenopathy is seen. CT CERVICAL SPINE FINDINGS There is no evidence of fracture or subluxation. There is chronic loss of height at vertebral bodies C5 and C6, and disc space narrowing at C5-C7. Osseous fusion is noted at C7-T1. Associated anterior and posterior disc osteophyte complexes are noted along the lower cervical spine. Vertebral bodies demonstrate normal height and alignment. Prevertebral soft tissues are within normal limits. The thyroid gland is unremarkable in appearance. Dense scarring is noted at the lung apices. Dense calcification is noted at the carotid bifurcations bilaterally. A tracheostomy cap is noted. IMPRESSION: 1. No evidence of traumatic intracranial injury or fracture. 2. No evidence of fracture or dislocation with regard to the maxillofacial structures. 3. No evidence of fracture or subluxation along the cervical spine. 4. Mild soft tissue injury noted lateral to the left orbit. 5. Mild degenerative change along the lower cervical spine. Osseous fusion noted at C7-T1. 6. Dense scarring noted at the lung apices. 7. Dense calcification at the carotid bifurcations bilaterally. Carotid ultrasound could be considered for further evaluation, when and as deemed clinically appropriate. Electronically Signed   By: Garald Balding M.D.   On: 10/30/2015 00:40   I have personally reviewed and evaluated these images and lab results as part of my medical decision-making.   EKG Interpretation None      MDM   Final diagnoses:  Contusion  Disorientation   Presented to the ER for evaluation after a fall. Patient fell around 10 PM tonight. Fall was unwitnessed. She hit her eye on an end table and has been complaining of neck pain. CT head, maxillofacial bones, cervical spine were performed and are negative. Family reports that she has been confused,  crying tonight, thought she saw her dead husband earlier. There is no other abnormality seen on workup. No urinary tract infection, metabolic derangement. Patient does have a history of depression. I feel she is safe for discharge will follow-up with primary doctor for reevaluation.  I personally performed the services described in this documentation, which was  scribed in my presence. The recorded information has been reviewed and is accurate.      Orpah Greek, MD 10/30/15 [phone]

## 2015-10-29 NOTE — ED Notes (Signed)
MD at bedside. 

## 2015-10-30 DIAGNOSIS — S0012XA Contusion of left eyelid and periocular area, initial encounter: Secondary | ICD-10-CM | POA: Diagnosis not present

## 2015-10-30 LAB — BASIC METABOLIC PANEL
Anion gap: 8 (ref 5–15)
BUN: 20 mg/dL (ref 6–20)
CO2: 25 mmol/L (ref 22–32)
CREATININE: 1.31 mg/dL — AB (ref 0.44–1.00)
Calcium: 9.2 mg/dL (ref 8.9–10.3)
Chloride: 105 mmol/L (ref 101–111)
GFR calc Af Amer: 47 mL/min — ABNORMAL LOW (ref >60)
GFR calc non Af Amer: 41 mL/min — ABNORMAL LOW (ref >60)
Glucose, Bld: 100 mg/dL — ABNORMAL HIGH (ref 65–99)
Potassium: 4.4 mmol/L (ref 3.5–5.1)
Sodium: 138 mmol/L (ref 135–145)

## 2015-10-30 LAB — URINALYSIS, ROUTINE W REFLEX MICROSCOPIC
Bilirubin Urine: NEGATIVE
GLUCOSE, UA: NEGATIVE mg/dL
Ketones, ur: NEGATIVE mg/dL
Nitrite: NEGATIVE
PH: 6 (ref 5.0–8.0)
Protein, ur: 100 mg/dL — AB
Specific Gravity, Urine: 1.01 (ref 1.005–1.030)

## 2015-10-30 LAB — URINE MICROSCOPIC-ADD ON

## 2015-10-30 LAB — TROPONIN I: Troponin I: <0.03 ng/mL (ref <0.031)

## 2015-10-30 NOTE — Discharge Instructions (Signed)
Confusin (Confusion) La confusin es la incapacidad de pensar con la velocidad o la claridad habituales. La confusin puede aparecer rpidamente o puede manifestarse de a poco, con [employer]. La rapidez con la que aparezca depender de la causa. La confusin puede surgir por diversas causas. CAUSAS   Conmocin, lesin o traumatismo en la cabeza.  Convulsiones.  Ictus.  Cristy Hilts.  Tumores cerebrales.  Disminucin de la funcin cerebral relacionada con la edad (demencia).  Estados emocionales agudos, como furia o terror.  Enfermedad mental, en la que la persona pierde la capacidad de Teacher, adult education lo que es real y lo que no lo es (alucinaciones).  Infecciones, como la infeccin de las vas urinarias (IVU).  Efectos txicos del alcohol, las drogas o medicamentos prescriptos.  Deshidratacin y desequilibrio de Administrator cuerpo (electrolitos).  Falta de sueo.  Niveles bajos de azcar en la sangre (diabetes).  Niveles bajos de oxgeno por afecciones como las enfermedades pulmonares crnicas.  Interacciones [employer] u otros efectos secundarios de los medicamentos.  Deficiencias nutricionales, especialmente de niacina, tiamina, vitaminaC o vitaminaB.  Descenso repentino de Environmental education officer (hipotermia).  Cambio en la rutina, por ejemplo, durante un viaje o una hospitalizacin. SIGNOS Y SNTOMAS  A menudo, las personas declaran que, cuando estn confundidas, tienen pensamientos poco claros o inciertos. La confusin tambin puede incluir sentirse desorientado, lo que significa que uno no tiene conciencia de dnde est o de quin es. Tambin es posible no saber la fecha o la hora. Al estar confundido, tambin se puede tener dificultad para prestar atencin, recordar y tomar decisiones. Asimismo, algunas personas actan con agresividad cuando estn confundidas.  DIAGNSTICO  La evaluacin mdica de la confusin puede incluir lo siguiente:  Anlisis de [place] y  [place].  Radiografas.  Estudios del cerebro y el sistema nervioso.  Anlisis de las ondas cerebrales (electroencefalograma o EEG).  Imgenes por Health visitor (IRM) de la cabeza.  Tomografa computarizada (TC) de la cabeza.  Estudios del [employer], en los que el mdico puede hacer diversas preguntas. Algunas de estas preguntas pueden parecer tontas o extraas, pero son muy importantes para ayudar a diagnosticar y tratar la confusin. TRATAMIENTO  Quizs no sea necesaria la hospitalizacin, pero una persona con confusin no debe estar sola. Permanezca con un familiar o un amigo hasta que la confusin haya desaparecido. Evite el alcohol, los analgsicos y los sedantes hasta que se haya recuperado completamente. No conduzca hasta que el mdico lo autorice. INSTRUCCIONES PARA EL CUIDADO EN EL HOGAR  Lo que pueden hacer los familiares y amigos:  Para averiguar si alguien est confundido, pregntele el nombre, la edad y Soil scientist. Si la persona est insegura o responde de forma incorrecta, est confundida.  Presntese siempre, ms all de que la persona confundida lo conozca [employer].  Recurdele con frecuencia dnde est.  Coloque un calendario y un reloj cerca de la persona confundida.  Ayude a la persona con los medicamentos. Puede usar un pastillero o una alarma como recordatorio, o darle cada dosis como se la recetaron.  Hable sobre los acontecimientos actuales y los planes para Games developer.  Trate de que el entorno sea calmo, sin ruidos y sereno.  Asegrese de que la persona asista a las visitas de seguimiento con el mdico. PREVENCIN  Modos de evitar la confusin:  Evite el alcohol.  Consuma una dieta equilibrada.  Duerma lo suficiente.  Tome todos los medicamentos como le indic el mdico.  No se asle. Pase tiempo con  otras personas y haga planes para todos los [place].  Si es diabtico, supervise con cuidado los niveles de Dispensing optician. SOLICITE ATENCIN  MDICA DE INMEDIATO SI:   Sufre dolores de cabeza intensos, vomita con frecuencia, tiene convulsiones, desvanecimientos o no puede hablar correctamente.  Aumenta la confusin, siente debilidad, adormecimiento, inquietud o modificaciones en la personalidad.  Tiene prdida de equilibrio o [employer], siente que no coordina o se Programme researcher, broadcasting/film/video.  Tiene delirios, alucinaciones o ansiedad intensa.  Su familia considera que debe ser controlado nuevamente.   Esta informacin no tiene Marine scientist el consejo del mdico. Asegrese de hacerle al mdico cualquier pregunta que tenga.   Document Released: 11/30/2005 Document Revised: 12/21/2014 Elsevier Interactive Patient Education 2016 [place]. Contusion A contusion is a deep bruise. Contusions are the result of a blunt injury to tissues and muscle fibers under the skin. The injury causes bleeding under the skin. The skin overlying the contusion may turn blue, purple, or yellow. Minor injuries will give you a painless contusion, but more severe contusions may stay painful and swollen for a few weeks.  CAUSES  This condition is usually caused by a blow, trauma, or direct force to an area of the body. SYMPTOMS  Symptoms of this condition include:  Swelling of the injured area.  Pain and tenderness in the injured area.  Discoloration. The area may have redness and then turn blue, purple, or yellow. DIAGNOSIS  This condition is diagnosed based on a physical exam and medical history. An X-ray, CT scan, or MRI may be needed to determine if there are any associated injuries, such as broken bones (fractures). TREATMENT  Specific treatment for this condition depends on what area of the body was injured. In general, the best treatment for a contusion is resting, icing, applying pressure to (compression), and elevating the injured area. This is often called the RICE strategy. Over-the-counter anti-inflammatory medicines may also be recommended for pain  control.  HOME CARE INSTRUCTIONS   Rest the injured area.  If directed, apply ice to the injured area:  Put ice in a plastic bag.  Place a towel between your skin and the bag.  Leave the ice on for 20 minutes, 2-3 times per day.  If directed, apply light compression to the injured area using an elastic bandage. Make sure the bandage is not wrapped too tightly. Remove and reapply the bandage as directed by your health care provider.  If possible, raise (elevate) the injured area above the level of your heart while you are sitting or lying down.  Take over-the-counter and prescription medicines only as told by your health care provider. SEEK MEDICAL CARE IF:  Your symptoms do not improve after several days of treatment.  Your symptoms get worse.  You have difficulty moving the injured area. SEEK IMMEDIATE MEDICAL CARE IF:   You have severe pain.  You have numbness in a hand or foot.  Your hand or foot turns pale or cold.   This information is not intended to replace advice given to you by your health care provider. Make sure you discuss any questions you have with your health care provider.   Document Released: 09/09/2005 Document Revised: 08/21/2015 Document Reviewed: 04/17/2015 Elsevier Interactive Patient Education [employer].

## 2015-10-30 NOTE — ED Notes (Signed)
EKG was delayed due to various factors.  MD was at bedside, Pt went to CT and Xray, and now the monitor is malfunctioning.  Waiting for pt to finish using the restroom and will use portable EKG machine.

## 2015-11-21 ENCOUNTER — Other Ambulatory Visit (HOSPITAL_COMMUNITY): Payer: Self-pay | Admitting: Family Medicine

## 2015-11-21 ENCOUNTER — Ambulatory Visit (HOSPITAL_COMMUNITY)
Admission: RE | Admit: 2015-11-21 | Discharge: 2015-11-21 | Disposition: A | Discharge status: Home / Self Care | Payer: Medicare Other | Source: Ambulatory Visit | Attending: Cardiovascular Disease | Admitting: Cardiovascular Disease

## 2015-11-21 DIAGNOSIS — I739 Peripheral vascular disease, unspecified: Secondary | ICD-10-CM

## 2015-12-23 DIAGNOSIS — K1120 Sialoadenitis, unspecified: Secondary | ICD-10-CM | POA: Diagnosis not present

## 2015-12-23 DIAGNOSIS — R1310 Dysphagia, unspecified: Secondary | ICD-10-CM | POA: Diagnosis not present

## 2015-12-23 DIAGNOSIS — Z48813 Encounter for surgical aftercare following surgery on the respiratory system: Secondary | ICD-10-CM | POA: Diagnosis not present

## 2015-12-23 DIAGNOSIS — Q387 Congenital pharyngeal pouch: Secondary | ICD-10-CM | POA: Diagnosis not present

## 2016-01-01 DIAGNOSIS — K222 Esophageal obstruction: Secondary | ICD-10-CM | POA: Diagnosis not present

## 2016-01-01 DIAGNOSIS — G8918 Other acute postprocedural pain: Secondary | ICD-10-CM | POA: Diagnosis not present

## 2016-01-01 DIAGNOSIS — R1310 Dysphagia, unspecified: Secondary | ICD-10-CM | POA: Diagnosis not present

## 2016-01-01 DIAGNOSIS — Z48813 Encounter for surgical aftercare following surgery on the respiratory system: Secondary | ICD-10-CM | POA: Diagnosis not present

## 2016-01-03 DIAGNOSIS — K222 Esophageal obstruction: Secondary | ICD-10-CM | POA: Diagnosis not present

## 2016-01-08 DIAGNOSIS — I739 Peripheral vascular disease, unspecified: Secondary | ICD-10-CM | POA: Diagnosis not present

## 2016-01-08 DIAGNOSIS — R45851 Suicidal ideations: Secondary | ICD-10-CM | POA: Diagnosis not present

## 2016-01-14 DIAGNOSIS — Z87891 Personal history of nicotine dependence: Secondary | ICD-10-CM | POA: Diagnosis not present

## 2016-01-14 DIAGNOSIS — K222 Esophageal obstruction: Secondary | ICD-10-CM | POA: Diagnosis not present

## 2016-01-14 DIAGNOSIS — K449 Diaphragmatic hernia without obstruction or gangrene: Secondary | ICD-10-CM | POA: Diagnosis not present

## 2016-01-14 DIAGNOSIS — R1310 Dysphagia, unspecified: Secondary | ICD-10-CM | POA: Diagnosis not present

## 2016-02-11 DIAGNOSIS — Q387 Congenital pharyngeal pouch: Secondary | ICD-10-CM | POA: Diagnosis not present

## 2016-02-11 DIAGNOSIS — K222 Esophageal obstruction: Secondary | ICD-10-CM | POA: Diagnosis not present

## 2016-02-11 DIAGNOSIS — R1310 Dysphagia, unspecified: Secondary | ICD-10-CM | POA: Diagnosis not present

## 2016-02-11 DIAGNOSIS — Z8719 Personal history of other diseases of the digestive system: Secondary | ICD-10-CM | POA: Diagnosis not present

## 2016-02-11 DIAGNOSIS — Z8521 Personal history of malignant neoplasm of larynx: Secondary | ICD-10-CM | POA: Diagnosis not present

## 2016-02-11 DIAGNOSIS — E042 Nontoxic multinodular goiter: Secondary | ICD-10-CM | POA: Diagnosis not present

## 2016-02-11 DIAGNOSIS — R070 Pain in throat: Secondary | ICD-10-CM | POA: Diagnosis not present

## 2016-02-11 DIAGNOSIS — Z9889 Other specified postprocedural states: Secondary | ICD-10-CM | POA: Diagnosis not present

## 2016-02-11 DIAGNOSIS — R911 Solitary pulmonary nodule: Secondary | ICD-10-CM | POA: Diagnosis not present

## 2016-02-11 DIAGNOSIS — Z923 Personal history of irradiation: Secondary | ICD-10-CM | POA: Diagnosis not present

## 2016-04-01 DIAGNOSIS — F5101 Primary insomnia: Secondary | ICD-10-CM | POA: Diagnosis not present

## 2016-04-01 DIAGNOSIS — I10 Essential (primary) hypertension: Secondary | ICD-10-CM | POA: Diagnosis not present

## 2016-04-01 DIAGNOSIS — I739 Peripheral vascular disease, unspecified: Secondary | ICD-10-CM | POA: Diagnosis not present

## 2016-04-01 DIAGNOSIS — F329 Major depressive disorder, single episode, unspecified: Secondary | ICD-10-CM | POA: Diagnosis not present

## 2016-04-01 DIAGNOSIS — E782 Mixed hyperlipidemia: Secondary | ICD-10-CM | POA: Diagnosis not present

## 2016-04-01 DIAGNOSIS — J42 Unspecified chronic bronchitis: Secondary | ICD-10-CM | POA: Diagnosis not present

## 2016-04-01 DIAGNOSIS — C140 Malignant neoplasm of pharynx, unspecified: Secondary | ICD-10-CM | POA: Diagnosis not present

## 2016-04-01 DIAGNOSIS — M810 Age-related osteoporosis without current pathological fracture: Secondary | ICD-10-CM | POA: Diagnosis not present

## 2016-04-06 DIAGNOSIS — R197 Diarrhea, unspecified: Secondary | ICD-10-CM | POA: Diagnosis not present

## 2016-04-06 DIAGNOSIS — J019 Acute sinusitis, unspecified: Secondary | ICD-10-CM | POA: Diagnosis not present

## 2016-04-28 DIAGNOSIS — K529 Noninfective gastroenteritis and colitis, unspecified: Secondary | ICD-10-CM | POA: Diagnosis not present

## 2016-04-28 DIAGNOSIS — R5383 Other fatigue: Secondary | ICD-10-CM | POA: Diagnosis not present

## 2016-04-28 DIAGNOSIS — I739 Peripheral vascular disease, unspecified: Secondary | ICD-10-CM | POA: Diagnosis not present

## 2016-04-28 DIAGNOSIS — F339 Major depressive disorder, recurrent, unspecified: Secondary | ICD-10-CM | POA: Diagnosis not present

## 2016-05-28 DIAGNOSIS — F5104 Psychophysiologic insomnia: Secondary | ICD-10-CM | POA: Diagnosis not present

## 2016-05-28 DIAGNOSIS — I10 Essential (primary) hypertension: Secondary | ICD-10-CM | POA: Diagnosis not present

## 2016-05-28 DIAGNOSIS — F339 Major depressive disorder, recurrent, unspecified: Secondary | ICD-10-CM | POA: Diagnosis not present

## 2016-05-28 DIAGNOSIS — K529 Noninfective gastroenteritis and colitis, unspecified: Secondary | ICD-10-CM | POA: Diagnosis not present

## 2016-05-28 DIAGNOSIS — J309 Allergic rhinitis, unspecified: Secondary | ICD-10-CM | POA: Diagnosis not present

## 2016-06-09 DIAGNOSIS — R490 Dysphonia: Secondary | ICD-10-CM | POA: Diagnosis not present

## 2016-06-10 DIAGNOSIS — E785 Hyperlipidemia, unspecified: Secondary | ICD-10-CM | POA: Diagnosis not present

## 2016-06-10 DIAGNOSIS — I10 Essential (primary) hypertension: Secondary | ICD-10-CM | POA: Diagnosis not present

## 2016-06-10 DIAGNOSIS — J398 Other specified diseases of upper respiratory tract: Secondary | ICD-10-CM | POA: Diagnosis not present

## 2016-06-10 DIAGNOSIS — Z87891 Personal history of nicotine dependence: Secondary | ICD-10-CM | POA: Diagnosis not present

## 2016-06-10 DIAGNOSIS — K9433 Esophagostomy malfunction: Secondary | ICD-10-CM | POA: Diagnosis not present

## 2016-06-10 DIAGNOSIS — K222 Esophageal obstruction: Secondary | ICD-10-CM | POA: Diagnosis not present

## 2016-06-10 DIAGNOSIS — Z8521 Personal history of malignant neoplasm of larynx: Secondary | ICD-10-CM | POA: Diagnosis not present

## 2016-06-12 DIAGNOSIS — Z8673 Personal history of transient ischemic attack (TIA), and cerebral infarction without residual deficits: Secondary | ICD-10-CM | POA: Diagnosis not present

## 2016-06-12 DIAGNOSIS — J398 Other specified diseases of upper respiratory tract: Secondary | ICD-10-CM | POA: Diagnosis not present

## 2016-06-12 DIAGNOSIS — K222 Esophageal obstruction: Secondary | ICD-10-CM | POA: Diagnosis not present

## 2016-06-12 DIAGNOSIS — E785 Hyperlipidemia, unspecified: Secondary | ICD-10-CM | POA: Diagnosis not present

## 2016-06-12 DIAGNOSIS — Z87891 Personal history of nicotine dependence: Secondary | ICD-10-CM | POA: Diagnosis not present

## 2016-06-12 DIAGNOSIS — F419 Anxiety disorder, unspecified: Secondary | ICD-10-CM | POA: Diagnosis not present

## 2016-06-12 DIAGNOSIS — K219 Gastro-esophageal reflux disease without esophagitis: Secondary | ICD-10-CM | POA: Diagnosis not present

## 2016-06-12 DIAGNOSIS — J9503 Malfunction of tracheostomy stoma: Secondary | ICD-10-CM | POA: Diagnosis not present

## 2016-06-12 DIAGNOSIS — I10 Essential (primary) hypertension: Secondary | ICD-10-CM | POA: Diagnosis not present

## 2016-06-12 DIAGNOSIS — R1319 Other dysphagia: Secondary | ICD-10-CM | POA: Diagnosis not present

## 2016-07-20 DIAGNOSIS — R490 Dysphonia: Secondary | ICD-10-CM | POA: Diagnosis not present

## 2016-08-18 DIAGNOSIS — K222 Esophageal obstruction: Secondary | ICD-10-CM | POA: Diagnosis not present

## 2016-08-18 DIAGNOSIS — Z9889 Other specified postprocedural states: Secondary | ICD-10-CM | POA: Diagnosis not present

## 2016-08-18 DIAGNOSIS — J398 Other specified diseases of upper respiratory tract: Secondary | ICD-10-CM | POA: Diagnosis not present

## 2016-08-18 DIAGNOSIS — Z8521 Personal history of malignant neoplasm of larynx: Secondary | ICD-10-CM | POA: Diagnosis not present

## 2016-08-25 DIAGNOSIS — Z8521 Personal history of malignant neoplasm of larynx: Secondary | ICD-10-CM | POA: Diagnosis not present

## 2016-08-25 DIAGNOSIS — K219 Gastro-esophageal reflux disease without esophagitis: Secondary | ICD-10-CM | POA: Diagnosis not present

## 2016-08-25 DIAGNOSIS — Z87891 Personal history of nicotine dependence: Secondary | ICD-10-CM | POA: Diagnosis not present

## 2016-08-25 DIAGNOSIS — I10 Essential (primary) hypertension: Secondary | ICD-10-CM | POA: Diagnosis not present

## 2016-08-25 DIAGNOSIS — E785 Hyperlipidemia, unspecified: Secondary | ICD-10-CM | POA: Diagnosis not present

## 2016-08-25 DIAGNOSIS — Z9002 Acquired absence of larynx: Secondary | ICD-10-CM | POA: Diagnosis not present

## 2016-08-25 DIAGNOSIS — Z8673 Personal history of transient ischemic attack (TIA), and cerebral infarction without residual deficits: Secondary | ICD-10-CM | POA: Diagnosis not present

## 2016-08-25 DIAGNOSIS — R491 Aphonia: Secondary | ICD-10-CM | POA: Diagnosis not present

## 2016-08-25 DIAGNOSIS — J398 Other specified diseases of upper respiratory tract: Secondary | ICD-10-CM | POA: Diagnosis not present

## 2016-08-25 DIAGNOSIS — F419 Anxiety disorder, unspecified: Secondary | ICD-10-CM | POA: Diagnosis not present

## 2016-08-25 DIAGNOSIS — K222 Esophageal obstruction: Secondary | ICD-10-CM | POA: Diagnosis not present

## 2016-09-02 DIAGNOSIS — R490 Dysphonia: Secondary | ICD-10-CM | POA: Diagnosis not present

## 2016-10-07 DIAGNOSIS — R490 Dysphonia: Secondary | ICD-10-CM | POA: Diagnosis not present

## 2016-10-12 DIAGNOSIS — Z923 Personal history of irradiation: Secondary | ICD-10-CM | POA: Diagnosis not present

## 2016-10-12 DIAGNOSIS — Z9889 Other specified postprocedural states: Secondary | ICD-10-CM | POA: Diagnosis not present

## 2016-10-12 DIAGNOSIS — Z48813 Encounter for surgical aftercare following surgery on the respiratory system: Secondary | ICD-10-CM | POA: Diagnosis not present

## 2016-10-12 DIAGNOSIS — R1310 Dysphagia, unspecified: Secondary | ICD-10-CM | POA: Diagnosis not present

## 2016-10-12 DIAGNOSIS — Z8521 Personal history of malignant neoplasm of larynx: Secondary | ICD-10-CM | POA: Diagnosis not present

## 2016-10-12 DIAGNOSIS — Z9221 Personal history of antineoplastic chemotherapy: Secondary | ICD-10-CM | POA: Diagnosis not present

## 2016-10-12 DIAGNOSIS — Z963 Presence of artificial larynx: Secondary | ICD-10-CM | POA: Diagnosis not present

## 2016-11-03 DIAGNOSIS — Z9002 Acquired absence of larynx: Secondary | ICD-10-CM | POA: Insufficient documentation

## 2016-11-03 DIAGNOSIS — K222 Esophageal obstruction: Secondary | ICD-10-CM | POA: Diagnosis not present

## 2016-11-03 DIAGNOSIS — Z923 Personal history of irradiation: Secondary | ICD-10-CM | POA: Diagnosis not present

## 2016-11-03 DIAGNOSIS — Z8521 Personal history of malignant neoplasm of larynx: Secondary | ICD-10-CM | POA: Diagnosis not present

## 2016-11-03 DIAGNOSIS — F419 Anxiety disorder, unspecified: Secondary | ICD-10-CM | POA: Diagnosis not present

## 2016-11-03 DIAGNOSIS — Z79899 Other long term (current) drug therapy: Secondary | ICD-10-CM | POA: Diagnosis not present

## 2016-11-03 DIAGNOSIS — I10 Essential (primary) hypertension: Secondary | ICD-10-CM | POA: Diagnosis not present

## 2016-11-03 DIAGNOSIS — Z9221 Personal history of antineoplastic chemotherapy: Secondary | ICD-10-CM | POA: Diagnosis not present

## 2016-11-03 DIAGNOSIS — R1310 Dysphagia, unspecified: Secondary | ICD-10-CM | POA: Diagnosis not present

## 2016-11-03 DIAGNOSIS — Z87891 Personal history of nicotine dependence: Secondary | ICD-10-CM | POA: Diagnosis not present

## 2016-12-10 DIAGNOSIS — R490 Dysphonia: Secondary | ICD-10-CM | POA: Diagnosis not present

## 2017-01-18 DIAGNOSIS — F329 Major depressive disorder, single episode, unspecified: Secondary | ICD-10-CM | POA: Diagnosis not present

## 2017-01-18 DIAGNOSIS — I10 Essential (primary) hypertension: Secondary | ICD-10-CM | POA: Diagnosis not present

## 2017-01-18 DIAGNOSIS — C140 Malignant neoplasm of pharynx, unspecified: Secondary | ICD-10-CM | POA: Diagnosis not present

## 2017-01-18 DIAGNOSIS — M810 Age-related osteoporosis without current pathological fracture: Secondary | ICD-10-CM | POA: Diagnosis not present

## 2017-01-18 DIAGNOSIS — J320 Chronic maxillary sinusitis: Secondary | ICD-10-CM | POA: Diagnosis not present

## 2017-01-18 DIAGNOSIS — J3089 Other allergic rhinitis: Secondary | ICD-10-CM | POA: Diagnosis not present

## 2017-01-22 DIAGNOSIS — Z23 Encounter for immunization: Secondary | ICD-10-CM | POA: Diagnosis not present

## 2017-01-22 DIAGNOSIS — M810 Age-related osteoporosis without current pathological fracture: Secondary | ICD-10-CM | POA: Diagnosis not present

## 2017-04-21 DIAGNOSIS — Z9002 Acquired absence of larynx: Secondary | ICD-10-CM | POA: Diagnosis not present

## 2017-04-21 DIAGNOSIS — R1310 Dysphagia, unspecified: Secondary | ICD-10-CM | POA: Diagnosis not present

## 2017-04-21 DIAGNOSIS — K222 Esophageal obstruction: Secondary | ICD-10-CM | POA: Diagnosis not present

## 2017-05-11 DIAGNOSIS — J392 Other diseases of pharynx: Secondary | ICD-10-CM | POA: Diagnosis not present

## 2017-05-11 DIAGNOSIS — Z7902 Long term (current) use of antithrombotics/antiplatelets: Secondary | ICD-10-CM | POA: Diagnosis not present

## 2017-05-11 DIAGNOSIS — F419 Anxiety disorder, unspecified: Secondary | ICD-10-CM | POA: Diagnosis not present

## 2017-05-11 DIAGNOSIS — R1310 Dysphagia, unspecified: Secondary | ICD-10-CM | POA: Diagnosis not present

## 2017-05-11 DIAGNOSIS — Z9221 Personal history of antineoplastic chemotherapy: Secondary | ICD-10-CM | POA: Diagnosis not present

## 2017-05-11 DIAGNOSIS — Z8521 Personal history of malignant neoplasm of larynx: Secondary | ICD-10-CM | POA: Diagnosis not present

## 2017-05-11 DIAGNOSIS — Z79899 Other long term (current) drug therapy: Secondary | ICD-10-CM | POA: Diagnosis not present

## 2017-05-11 DIAGNOSIS — Z9002 Acquired absence of larynx: Secondary | ICD-10-CM | POA: Diagnosis not present

## 2017-05-11 DIAGNOSIS — I10 Essential (primary) hypertension: Secondary | ICD-10-CM | POA: Diagnosis not present

## 2017-05-11 DIAGNOSIS — Z923 Personal history of irradiation: Secondary | ICD-10-CM | POA: Diagnosis not present

## 2017-05-11 DIAGNOSIS — Z87891 Personal history of nicotine dependence: Secondary | ICD-10-CM | POA: Diagnosis not present

## 2017-05-19 DIAGNOSIS — Z9002 Acquired absence of larynx: Secondary | ICD-10-CM | POA: Diagnosis not present

## 2017-05-19 DIAGNOSIS — R1110 Vomiting, unspecified: Secondary | ICD-10-CM | POA: Diagnosis not present

## 2017-05-19 DIAGNOSIS — R1314 Dysphagia, pharyngoesophageal phase: Secondary | ICD-10-CM | POA: Diagnosis not present

## 2017-05-19 DIAGNOSIS — R490 Dysphonia: Secondary | ICD-10-CM | POA: Diagnosis not present

## 2017-05-19 DIAGNOSIS — K222 Esophageal obstruction: Secondary | ICD-10-CM | POA: Diagnosis not present

## 2017-05-19 DIAGNOSIS — R1310 Dysphagia, unspecified: Secondary | ICD-10-CM | POA: Diagnosis not present

## 2017-05-25 DIAGNOSIS — Z9889 Other specified postprocedural states: Secondary | ICD-10-CM | POA: Diagnosis not present

## 2017-05-25 DIAGNOSIS — Z923 Personal history of irradiation: Secondary | ICD-10-CM | POA: Diagnosis not present

## 2017-05-25 DIAGNOSIS — R1310 Dysphagia, unspecified: Secondary | ICD-10-CM | POA: Diagnosis not present

## 2017-05-25 DIAGNOSIS — R491 Aphonia: Secondary | ICD-10-CM | POA: Diagnosis not present

## 2017-05-25 DIAGNOSIS — Z87891 Personal history of nicotine dependence: Secondary | ICD-10-CM | POA: Diagnosis not present

## 2017-05-25 DIAGNOSIS — K222 Esophageal obstruction: Secondary | ICD-10-CM | POA: Diagnosis not present

## 2017-05-25 DIAGNOSIS — Z9002 Acquired absence of larynx: Secondary | ICD-10-CM | POA: Diagnosis not present

## 2017-05-25 DIAGNOSIS — Z9221 Personal history of antineoplastic chemotherapy: Secondary | ICD-10-CM | POA: Diagnosis not present

## 2017-05-25 DIAGNOSIS — Z8521 Personal history of malignant neoplasm of larynx: Secondary | ICD-10-CM | POA: Diagnosis not present

## 2017-05-25 DIAGNOSIS — B3781 Candidal esophagitis: Secondary | ICD-10-CM | POA: Diagnosis not present

## 2017-06-07 DIAGNOSIS — R490 Dysphonia: Secondary | ICD-10-CM | POA: Diagnosis not present

## 2017-08-05 DIAGNOSIS — K222 Esophageal obstruction: Secondary | ICD-10-CM | POA: Diagnosis not present

## 2017-08-05 DIAGNOSIS — Z87891 Personal history of nicotine dependence: Secondary | ICD-10-CM | POA: Diagnosis not present

## 2017-08-05 DIAGNOSIS — Z9002 Acquired absence of larynx: Secondary | ICD-10-CM | POA: Diagnosis not present

## 2017-08-05 DIAGNOSIS — B3781 Candidal esophagitis: Secondary | ICD-10-CM | POA: Diagnosis not present

## 2017-08-05 DIAGNOSIS — Z8521 Personal history of malignant neoplasm of larynx: Secondary | ICD-10-CM | POA: Diagnosis not present

## 2017-08-05 DIAGNOSIS — R1310 Dysphagia, unspecified: Secondary | ICD-10-CM | POA: Diagnosis not present

## 2017-08-05 DIAGNOSIS — Z923 Personal history of irradiation: Secondary | ICD-10-CM | POA: Diagnosis not present

## 2017-08-05 DIAGNOSIS — Z9221 Personal history of antineoplastic chemotherapy: Secondary | ICD-10-CM | POA: Diagnosis not present

## 2017-08-05 DIAGNOSIS — R491 Aphonia: Secondary | ICD-10-CM | POA: Diagnosis not present

## 2017-08-13 DIAGNOSIS — N183 Chronic kidney disease, stage 3 (moderate): Secondary | ICD-10-CM | POA: Diagnosis not present

## 2017-08-13 DIAGNOSIS — E785 Hyperlipidemia, unspecified: Secondary | ICD-10-CM | POA: Diagnosis not present

## 2017-08-13 DIAGNOSIS — I10 Essential (primary) hypertension: Secondary | ICD-10-CM | POA: Diagnosis not present

## 2017-08-13 DIAGNOSIS — M818 Other osteoporosis without current pathological fracture: Secondary | ICD-10-CM | POA: Diagnosis not present

## 2017-08-13 DIAGNOSIS — F341 Dysthymic disorder: Secondary | ICD-10-CM | POA: Diagnosis not present

## 2017-08-17 DIAGNOSIS — R490 Dysphonia: Secondary | ICD-10-CM | POA: Diagnosis not present

## 2017-10-20 ENCOUNTER — Ambulatory Visit (INDEPENDENT_AMBULATORY_CARE_PROVIDER_SITE_OTHER): Payer: Medicare Other | Admitting: Nurse Practitioner

## 2017-10-20 ENCOUNTER — Encounter: Payer: Self-pay | Admitting: Nurse Practitioner

## 2017-10-20 VITALS — BP 130/68 | HR 76 | Temp 99.0°F | Resp 18 | Ht 62.0 in | Wt 138.0 lb

## 2017-10-20 DIAGNOSIS — M25571 Pain in right ankle and joints of right foot: Secondary | ICD-10-CM | POA: Diagnosis not present

## 2017-10-20 DIAGNOSIS — J449 Chronic obstructive pulmonary disease, unspecified: Secondary | ICD-10-CM | POA: Diagnosis not present

## 2017-10-20 DIAGNOSIS — G8929 Other chronic pain: Secondary | ICD-10-CM | POA: Diagnosis not present

## 2017-10-20 DIAGNOSIS — F331 Major depressive disorder, recurrent, moderate: Secondary | ICD-10-CM

## 2017-10-20 NOTE — Patient Instructions (Addendum)
I have placed referrals for pulmonology, podiatry, and psychiatry to help [place] manage your medications and symptoms. Our office will call you to schedule these appointments.  You may try avoiding milk and gluten, as these foods can sometimes worsen allergies and cause thicker mucous.  I would like for you to stop taking your trazodone gradually. Start 1/2 tablet (50 mg) nightly for 4 weeks, then 1/2 tablet (50 mg) every other night for 4 weeks, then stop.  I'd like to see you back in about 1 month to follow up and see how you are doing.

## 2017-10-20 NOTE — Progress Notes (Addendum)
Subjective:    Patient ID: [name], female    DOB: 1946-08-16, 71 y.o.   MRN: [id number]  HPI [name] is a 71yo female who presents today to establish care. She is spanish speaking and comes to the clinic today with a Spanish interpreter and her bilingual daughter.  Cough- Chronic. Tthick sputum and mucous that blocks her stoma. Sometimes she feels that it is difficult to breath. She also feels it is difficult to swallow liquids and solids.  She denies pain, fevers, drainage from her stoma, nausea, vomiting. She has a history of vocal cord cancer with laryngopharyngectomy and chemoradiation at [facility] in 2012. She has been cancer free since. Shes had several esophageal dilations in the past two years for esophageal strictures post laryngopharyngectomy.  She has follows with otolaryngology and speech therapy with no improvement. shes tried Human resources officer and flonase for allergies and using humidified air through trach collar with no improvement. She does not currently have a pulmonologist. She was a cigarette smoker for 50 years.  Right ankle pain, chronic- ongoing for 3 years. Describes as sore pain that is worse with activity. She fractured the ankle 20 years ago. She denies recent injury. She denies decreased mobility, bruising, discoloration, swelling. She was told by vascular surgery in the past that she had some blockage in her legs and she should walk daily to help the pain but she did not return for further vascular follow up.  Depression- Maintained on traxodone, lexapro, buproprion, elavil. She continues to have depression daily. She has felt more depressed since the change in weather and feels that her depression is worse every fall and winter and improves in the spring and summer. She has multiple comorbid health conditions which contribute to her depression. She does not feel anxious. She denies thoughts of harming herself or others.  Review of Systems  Constitutional:  Negative.  Negative for activity change and appetite change.  HENT: Negative for dental problem.   Eyes: Negative for visual disturbance.  Respiratory: Negative for shortness of breath.   Cardiovascular: Negative for chest pain and palpitations.  Gastrointestinal: Negative for constipation and diarrhea.  Endocrine: Negative for cold intolerance and heat intolerance.  Genitourinary: Negative for difficulty urinating and hematuria.  Musculoskeletal: Negative for gait problem and myalgias.  Skin: Negative for rash.  Allergic/Immunologic: Negative for food allergies.  Neurological: Negative for headaches.  Hematological: Does not bruise/bleed easily.  Psychiatric/Behavioral: Negative for confusion.      Past Medical History:  Diagnosis Date  . Anginal pain ([place])    ?  [name] Anxiety    takes Ativan prn  . Blood transfusion without reported diagnosis 09/15/12   2 units Prbc's  . Broken ribs   . Chronic back pain   . Constipation    related to pain meds  . COPD (chronic obstructive pulmonary disease) ([place]) 08/10/2012   denies  . Depression   . Gastrostomy in place [facility])    removed  . GERD (gastroesophageal reflux disease)    takes Zantac daily  . Headache(784.0)   . Hiatal hernia 08/10/2012  . History of radiation therapy 10/17/12-11/25/12   supraglottic larynx,high risk neck tumor bed 5880 cGy/28 sessions, high risk lymph node tumor bed 5600 cGy/20 sessions, mod risk lymph node tumor bed 5040 cGy/20 sessions  . Hypercholesteremia    takes Pravastatin daily  . Hypertension    takes Tribenzor and Atenolol daily  . Insomnia    takes Amitriptyline daily  . Nausea  takes Zofran prn  . Pneumonia   . 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 Shortness of breath dyspnea   . Stroke [facility]) 2011   denies. no residual  . Uterine cancer [facility])      Social History   Socioeconomic History  . Marital status: Widowed    Spouse name: Not on file  . Number of  children: 4  . Years of education: Not on file  . Highest education level: Not on file  Social Needs  . Financial resource strain: Not on file  . Food insecurity - worry: Not on file  . Food insecurity - inability: Not on file  . Transportation needs - medical: Not on file  . Transportation needs - non-medical: Not on file  Occupational History    Comment: retired Regulatory affairs officer  Tobacco Use  . Smoking status: Former Smoker    Packs/day: 0.25    Years: 50.00    Pack years: 12.50    Types: Cigarettes    Last attempt to quit: 05/27/2013    Years since quitting: 4.4  . Smokeless tobacco: Never Used  Substance and Sexual Activity  . Alcohol use: No  . Drug use: No  . Sexual activity: No  Other Topics Concern  . Not on file  Social History Narrative  . Not on file    Past Surgical History:  Procedure Laterality Date  . ABDOMINAL SURGERY     r/t uterine carcinoma  . APPENDECTOMY    . Gastrostomy Tube removed   2013  . GASTROSTOMY W/ FEEDING TUBE  12  . GASTROTOMY    . HERNIA REPAIR     child    No family history on file.  No Known Allergies  Current Outpatient Medications on File Prior to Visit  Medication Sig Dispense Refill  . amitriptyline (ELAVIL) 25 MG tablet Take 25 mg at bedtime by mouth. 1-2 tablets daily    . atenolol (TENORMIN) 25 MG tablet Take 12.5 mg by mouth 2 (two) times daily.     [name] buPROPion (WELLBUTRIN XL) 150 MG 24 hr tablet Take 150 mg daily by mouth.    . cetirizine (ZYRTEC) 10 MG tablet Take 10 mg by mouth daily.  5  . cilostazol (PLETAL) 100 MG tablet Take 100 mg 2 (two) times daily by mouth.    . diphenoxylate-atropine (LOMOTIL) 2.5-0.025 MG tablet Take 1 tablet 2 (two) times daily as needed by mouth for diarrhea or loose stools.    [name] escitalopram (LEXAPRO) 20 MG tablet Take 20 mg by mouth daily.    [name] LORazepam (ATIVAN) 1 MG tablet Take 1 mg by mouth daily.    . montelukast (SINGULAIR) 10 MG tablet Take 10 mg at bedtime by mouth.    .  Olmesartan-Amlodipine-HCTZ (TRIBENZOR) 20-5-12.5 MG TABS Take 1 tablet by mouth daily.    [name] omeprazole (PRILOSEC) 20 MG capsule Take 40 mg 2 (two) times daily by mouth.   3  . ondansetron (ZOFRAN) 8 MG tablet Take by mouth every 8 (eight) hours as needed for nausea or vomiting.    . rosuvastatin (CRESTOR) 20 MG tablet Take 20 mg by mouth daily.    . traZODone (DESYREL) 100 MG tablet Take 100 mg by mouth at bedtime.  3   No current facility-administered medications on file prior to visit.     BP 130/68 (BP Location: Left Arm, Patient Position: Sitting, Cuff Size: Normal)   Pulse 76   Temp 99 F (37.2 C) (  Oral)   Resp 18   Ht 5\' 2"  (1.575 m)   Wt 138 lb (62.6 kg)   SpO2 97%   BMI 25.24 kg/m      Objective:   Physical Exam  Constitutional: She is oriented to person, place, and time. She appears well-developed and well-nourished. No distress.  HENT:  Head: Normocephalic and atraumatic.  Neck: Full passive range of motion without pain. Neck supple. No tracheal tenderness present. No tracheal deviation present.  Stoma-open to air, no device present.  Cardiovascular: Normal rate, regular rhythm, normal heart sounds and intact distal pulses.  Pulmonary/Chest: Effort normal and breath sounds normal.  Musculoskeletal:       Right ankle: She exhibits normal range of motion, no swelling, no ecchymosis and no deformity. No tenderness.  Right foot is warm and dry with pulses intact  Neurological: She is alert and oriented to person, place, and time. Coordination normal.  Skin: Skin is warm and dry.  Psychiatric: She has a normal mood and affect. Judgment and thought content normal.      Assessment & Plan:  She will follow up in 1 month for management of chronic conditions  Chronic pain of right ankle- Ambulatory referral to Podiatry

## 2017-10-22 DIAGNOSIS — F331 Major depressive disorder, recurrent, moderate: Secondary | ICD-10-CM | POA: Insufficient documentation

## 2017-10-22 NOTE — Assessment & Plan Note (Signed)
managed on multiple medications without improvement. Discussed beginning trazodone taper today due to possible possible interaction with other medications, she will taper slowly over next 8 weeks. She declines counseling. Ambulatory referral to Psychiatry.

## 2017-10-22 NOTE — Assessment & Plan Note (Signed)
Chronic obstructive pulmonary disease, unspecified COPD type - 50 year history of cigarette smoking. She has a tracheal stoma post vocal cord cancer. She does not currently have a pulmonologist. Ambulatory referral to Pulmonology. Discussed avoidance of milk and gluten to evaluate for improvement in mucous consistency, possible allergy.

## 2017-11-09 DIAGNOSIS — Z79891 Long term (current) use of opiate analgesic: Secondary | ICD-10-CM | POA: Diagnosis not present

## 2017-11-09 DIAGNOSIS — Z963 Presence of artificial larynx: Secondary | ICD-10-CM | POA: Diagnosis not present

## 2017-11-09 DIAGNOSIS — Z7951 Long term (current) use of inhaled steroids: Secondary | ICD-10-CM | POA: Diagnosis not present

## 2017-11-09 DIAGNOSIS — Z79899 Other long term (current) drug therapy: Secondary | ICD-10-CM | POA: Diagnosis not present

## 2017-11-09 DIAGNOSIS — Z7902 Long term (current) use of antithrombotics/antiplatelets: Secondary | ICD-10-CM | POA: Diagnosis not present

## 2017-11-09 DIAGNOSIS — Z9002 Acquired absence of larynx: Secondary | ICD-10-CM | POA: Diagnosis not present

## 2017-11-09 DIAGNOSIS — I10 Essential (primary) hypertension: Secondary | ICD-10-CM | POA: Diagnosis not present

## 2017-11-09 DIAGNOSIS — K222 Esophageal obstruction: Secondary | ICD-10-CM | POA: Diagnosis not present

## 2017-11-09 DIAGNOSIS — Z8521 Personal history of malignant neoplasm of larynx: Secondary | ICD-10-CM | POA: Diagnosis not present

## 2017-11-09 DIAGNOSIS — I252 Old myocardial infarction: Secondary | ICD-10-CM | POA: Diagnosis not present

## 2017-11-17 ENCOUNTER — Ambulatory Visit: Payer: Self-pay | Admitting: Nurse Practitioner

## 2017-12-02 DIAGNOSIS — Z9689 Presence of other specified functional implants: Secondary | ICD-10-CM | POA: Diagnosis not present

## 2017-12-02 DIAGNOSIS — Z9002 Acquired absence of larynx: Secondary | ICD-10-CM | POA: Diagnosis not present

## 2017-12-02 DIAGNOSIS — R1310 Dysphagia, unspecified: Secondary | ICD-10-CM | POA: Diagnosis not present

## 2017-12-02 DIAGNOSIS — R490 Dysphonia: Secondary | ICD-10-CM | POA: Diagnosis not present

## 2017-12-16 ENCOUNTER — Encounter: Payer: Self-pay | Admitting: Nurse Practitioner

## 2017-12-16 ENCOUNTER — Telehealth: Payer: Self-pay | Admitting: Nurse Practitioner

## 2017-12-16 NOTE — Telephone Encounter (Signed)
Copied from [place] [id number]. Topic: Quick Communication - Rx Refill/Question >> Dec 16, 2017  5:09 PM Patrice Paradise wrote: Has the patient contacted their pharmacy? Yes.   Olmesartan-Amlodipine-HCTZ (TRIBENZOR) 20-5-12.5 MG TABS (PATIENT IS TOTALLY OF HER MED AND NEED IT ASAP)  (Agent: If no, request that the patient contact the pharmacy for the refill.)  Preferred Pharmacy (with phone number or street name):  [id number] - [place], [place] - [address] [place] [address] Phone: [phone] Fax: [phone]   Agent: Please be advised that RX refills may take up to 3 business days. We ask that you follow-up with your pharmacy.

## 2017-12-17 ENCOUNTER — Telehealth: Payer: Self-pay | Admitting: Nurse Practitioner

## 2017-12-17 ENCOUNTER — Other Ambulatory Visit: Payer: Self-pay

## 2017-12-17 MED ORDER — OLMESARTAN-AMLODIPINE-HCTZ 20-5-12.5 MG PO TABS: 1.0000 | ORAL_TABLET | Freq: Every day | ORAL | 0 refills | Status: DC

## 2017-12-17 NOTE — Telephone Encounter (Signed)
Would like to know if patient could transfer for Jenny Reichmann b/c patient would prefer an MD.  Please advise.

## 2017-12-17 NOTE — Telephone Encounter (Signed)
This has already been sent in.

## 2017-12-17 NOTE — Telephone Encounter (Signed)
Does patient need an office visit before refilling this med? Also there are no refills on the Tribenzor.

## 2017-12-17 NOTE — Telephone Encounter (Signed)
Very sorry, I am unable to accept at this time 

## 2017-12-20 DIAGNOSIS — R490 Dysphonia: Secondary | ICD-10-CM | POA: Diagnosis not present

## 2017-12-20 DIAGNOSIS — Z9002 Acquired absence of larynx: Secondary | ICD-10-CM | POA: Diagnosis not present

## 2017-12-20 DIAGNOSIS — R1319 Other dysphagia: Secondary | ICD-10-CM | POA: Diagnosis not present

## 2017-12-20 DIAGNOSIS — R1310 Dysphagia, unspecified: Secondary | ICD-10-CM | POA: Diagnosis not present

## 2017-12-20 NOTE — Telephone Encounter (Signed)
Yes, I will see her 

## 2017-12-20 NOTE — Telephone Encounter (Signed)
Dr. Jones, would you be able to take patient on? °

## 2017-12-21 NOTE — Telephone Encounter (Signed)
Left Jessica VM to call back to schedule transfer appt from [facility] to [place] for patient.

## 2017-12-28 ENCOUNTER — Telehealth: Payer: Self-pay

## 2017-12-28 ENCOUNTER — Encounter: Payer: Self-pay | Admitting: Internal Medicine

## 2017-12-28 ENCOUNTER — Other Ambulatory Visit (INDEPENDENT_AMBULATORY_CARE_PROVIDER_SITE_OTHER): Payer: Medicare Other

## 2017-12-28 ENCOUNTER — Ambulatory Visit (INDEPENDENT_AMBULATORY_CARE_PROVIDER_SITE_OTHER): Payer: Medicare Other | Admitting: Internal Medicine

## 2017-12-28 VITALS — BP 128/60 | HR 72 | Temp 98.8°F | Resp 16 | Ht 62.0 in | Wt 135.0 lb

## 2017-12-28 DIAGNOSIS — E785 Hyperlipidemia, unspecified: Secondary | ICD-10-CM | POA: Diagnosis not present

## 2017-12-28 DIAGNOSIS — J449 Chronic obstructive pulmonary disease, unspecified: Secondary | ICD-10-CM | POA: Diagnosis not present

## 2017-12-28 DIAGNOSIS — D520 Dietary folate deficiency anemia: Secondary | ICD-10-CM | POA: Diagnosis not present

## 2017-12-28 DIAGNOSIS — D508 Other iron deficiency anemias: Secondary | ICD-10-CM

## 2017-12-28 DIAGNOSIS — F411 Generalized anxiety disorder: Secondary | ICD-10-CM

## 2017-12-28 DIAGNOSIS — Z1211 Encounter for screening for malignant neoplasm of colon: Secondary | ICD-10-CM

## 2017-12-28 DIAGNOSIS — E781 Pure hyperglyceridemia: Secondary | ICD-10-CM | POA: Diagnosis not present

## 2017-12-28 DIAGNOSIS — Z23 Encounter for immunization: Secondary | ICD-10-CM

## 2017-12-28 DIAGNOSIS — R7989 Other specified abnormal findings of blood chemistry: Secondary | ICD-10-CM

## 2017-12-28 DIAGNOSIS — D539 Nutritional anemia, unspecified: Secondary | ICD-10-CM

## 2017-12-28 DIAGNOSIS — R7303 Prediabetes: Secondary | ICD-10-CM

## 2017-12-28 DIAGNOSIS — I10 Essential (primary) hypertension: Secondary | ICD-10-CM

## 2017-12-28 DIAGNOSIS — J4489 Other specified chronic obstructive pulmonary disease: Secondary | ICD-10-CM

## 2017-12-28 LAB — CBC WITH DIFFERENTIAL/PLATELET
BASOS ABS: 0.1 10*3/uL (ref 0.0–0.1)
BASOS PCT: 0.8 % (ref 0.0–3.0)
EOS ABS: 0.1 10*3/uL (ref 0.0–0.7)
Eosinophils Relative: 1.1 % (ref 0.0–5.0)
HEMATOCRIT: 33.4 % — AB (ref 36.0–46.0)
Hemoglobin: 11.1 g/dL — ABNORMAL LOW (ref 12.0–15.0)
LYMPHS PCT: 16.5 % (ref 12.0–46.0)
Lymphs Abs: 1.5 10*3/uL (ref 0.7–4.0)
MCHC: 33.2 g/dL (ref 30.0–36.0)
MCV: 82.5 fl (ref 78.0–100.0)
MONOS PCT: 7.9 % (ref 3.0–12.0)
Monocytes Absolute: 0.7 10*3/uL (ref 0.1–1.0)
Neutro Abs: 6.5 10*3/uL (ref 1.4–7.7)
Neutrophils Relative %: 73.7 % (ref 43.0–77.0)
Platelets: 274 10*3/uL (ref 150.0–400.0)
RBC: 4.05 Mil/uL (ref 3.87–5.11)
RDW: 14.6 % (ref 11.5–15.5)
WBC: 8.8 10*3/uL (ref 4.0–10.5)

## 2017-12-28 LAB — COMPREHENSIVE METABOLIC PANEL
ALBUMIN: 4.3 g/dL (ref 3.5–5.2)
ALT: 17 U/L (ref 0–35)
AST: 16 U/L (ref 0–37)
Alkaline Phosphatase: 91 U/L (ref 39–117)
BILIRUBIN TOTAL: 0.3 mg/dL (ref 0.2–1.2)
BUN: 22 mg/dL (ref 6–23)
CHLORIDE: 102 meq/L (ref 96–112)
CO2: 26 meq/L (ref 19–32)
CREATININE: 1.24 mg/dL — AB (ref 0.40–1.20)
Calcium: 9.6 mg/dL (ref 8.4–10.5)
GFR: 45.19 mL/min — AB (ref >60.00)
Glucose, Bld: 89 mg/dL (ref 70–99)
Potassium: 3.8 mEq/L (ref 3.5–5.1)
Sodium: 138 mEq/L (ref 135–145)
Total Protein: 7.4 g/dL (ref 6.0–8.3)

## 2017-12-28 LAB — LIPID PANEL
Cholesterol: 208 mg/dL — ABNORMAL HIGH (ref 0–200)
HDL: 52 mg/dL (ref >39.00)
Total CHOL/HDL Ratio: 4

## 2017-12-28 LAB — HEMOGLOBIN A1C: HEMOGLOBIN A1C: 5.8 % (ref 4.6–6.5)

## 2017-12-28 LAB — LDL CHOLESTEROL, DIRECT: Direct LDL: 100 mg/dL

## 2017-12-28 LAB — IBC PANEL
Iron: 43 ug/dL (ref 42–145)
SATURATION RATIOS: 12.3 % — AB (ref 20.0–50.0)
Transferrin: 249 mg/dL (ref 212.0–360.0)

## 2017-12-28 LAB — FERRITIN: Ferritin: 167 ng/mL (ref 10.0–291.0)

## 2017-12-28 LAB — TSH: TSH: 7.16 u[IU]/mL — ABNORMAL HIGH (ref 0.35–4.50)

## 2017-12-28 LAB — FOLATE: Folate: 3.7 ng/mL — ABNORMAL LOW (ref >5.9)

## 2017-12-28 LAB — VITAMIN B12: VITAMIN B 12: 388 pg/mL (ref 211–911)

## 2017-12-28 MED ORDER — GLYCOPYRROLATE 25 MCG/ML IN SOLN: 1.0000 | Freq: Two times a day (BID) | RESPIRATORY_TRACT | 1 refills | Status: DC

## 2017-12-28 MED ORDER — LORAZEPAM 1 MG PO TABS: 1.0000 mg | ORAL_TABLET | Freq: Every day | ORAL | 2 refills | Status: DC

## 2017-12-28 MED ORDER — GLYCOPYRROLATE 25 MCG/ML IN SOLN: 1.0000 | Freq: Two times a day (BID) | RESPIRATORY_TRACT | 11 refills | Status: DC

## 2017-12-28 MED ORDER — FOLIC ACID 1 MG PO TABS: 1.0000 mg | ORAL_TABLET | Freq: Every day | ORAL | 1 refills | Status: DC

## 2017-12-28 NOTE — Progress Notes (Signed)
Subjective:  Patient ID: [name], female    DOB: 1946-03-15  Age: 72 y.o. MRN: [id number]  CC: Cough; Anemia; and Hypertension  NEW TO ME  HPI [name] presents for concerns about a chronic cough.  She has a prior heavy smoker and has a history of COPD.  She also has a history of squamous cell carcinoma of the supraglottis and has a trach button and is status post surgery and radiation therapy.  Her daughter tells most of her story.  She complains of a chronic cough that is intermittently productive of white phlegm.  She denies chest pain, short wheezing, or hemoptysis.  She occasionally does experience SOB but not DOE.[name]  Outpatient Medications Prior to Visit  Medication Sig Dispense Refill  . amitriptyline (ELAVIL) 25 MG tablet Take 25 mg at bedtime by mouth. 1-2 tablets daily    . buPROPion (WELLBUTRIN XL) 150 MG 24 hr tablet Take 150 mg daily by mouth.    . cetirizine (ZYRTEC) 10 MG tablet Take 10 mg by mouth daily.  5  . cilostazol (PLETAL) 100 MG tablet Take 100 mg 2 (two) times daily by mouth.    . diphenoxylate-atropine (LOMOTIL) 2.5-0.025 MG tablet Take 1 tablet 2 (two) times daily as needed by mouth for diarrhea or loose stools.    [name] escitalopram (LEXAPRO) 20 MG tablet Take 20 mg by mouth daily.    . eszopiclone (LUNESTA) 2 MG TABS tablet TAKE 1 TABLET IMMEDIATELY BEFORE BEDTIME AS NEEDED    . fluticasone (FLONASE) 50 MCG/ACT nasal spray USE TWO SPRAYS BY NASAL ROUTE DAILY.    . montelukast (SINGULAIR) 10 MG tablet Take 10 mg at bedtime by mouth.    . Olmesartan-Amlodipine-HCTZ (TRIBENZOR) 20-5-12.5 MG TABS Take 1 tablet by mouth daily. 30 tablet 0  . Olopatadine HCl 0.2 % SOLN Place one drop into both eyes daily.    [name] omeprazole (PRILOSEC) 20 MG capsule Take 40 mg 2 (two) times daily by mouth.   3  . rosuvastatin (CRESTOR) 20 MG tablet Take 20 mg by mouth daily.    [name] triamcinolone (NASACORT) 55 MCG/ACT AERO nasal inhaler Place into the nose.    [name] atenolol  (TENORMIN) 25 MG tablet Take 12.5 mg by mouth 2 (two) times daily.     [name] LORazepam (ATIVAN) 1 MG tablet Take 1 mg by mouth daily.    . ondansetron (ZOFRAN) 8 MG tablet Take by mouth every 8 (eight) hours as needed for nausea or vomiting.     No facility-administered medications prior to visit.     ROS Review of Systems  Constitutional: Negative for appetite change, diaphoresis, fatigue and unexpected weight change.  HENT: Positive for trouble swallowing. Negative for sore throat and voice change.   Respiratory: Positive for cough and shortness of breath. Negative for chest tightness, wheezing and stridor.   Cardiovascular: Negative for chest pain, palpitations and leg swelling.  Gastrointestinal: Negative for abdominal pain, constipation, diarrhea, nausea and vomiting.  Genitourinary: Negative.   Musculoskeletal: Negative.   Skin: Negative.  Negative for pallor.  Neurological: Negative.   Hematological: Negative for adenopathy. Does not bruise/bleed easily.  Psychiatric/Behavioral: Positive for sleep disturbance. Negative for confusion, decreased concentration, dysphoric mood and suicidal ideas. The patient is nervous/anxious.        She complains of chronic, intermittent anxiety and is been maintained in the past on a benzodiazepine.  She requests a refill.    Objective:  BP 128/60 (BP Location: Left Arm, Patient Position:  Sitting, Cuff Size: Normal)   Pulse 72   Temp 98.8 F (37.1 C) (Oral)   Resp 16   Ht '5\' 2"'$  (1.575 m)   Wt 135 lb (61.2 kg)   SpO2 99%   BMI 24.69 kg/m   BP Readings from Last 3 Encounters:  12/28/17 128/60  10/20/17 130/68  10/29/15 186/80    Wt Readings from Last 3 Encounters:  12/28/17 135 lb (61.2 kg)  10/20/17 138 lb (62.6 kg)  05/29/15 132 lb (59.9 kg)    Physical Exam  Constitutional: She is oriented to person, place, and time. No distress.  HENT:  Mouth/Throat: Oropharynx is clear and moist. No oropharyngeal exudate.  Eyes: Conjunctivae  are normal. Left eye exhibits no discharge. No scleral icterus.  Neck: Normal range of motion. Neck supple. No JVD present. No thyromegaly present.  Positive trach button  Cardiovascular: Normal rate, regular rhythm and normal heart sounds. Exam reveals no gallop.  No murmur heard. Pulmonary/Chest: Effort normal. No accessory muscle usage. No respiratory distress. She has decreased breath sounds in the right upper field, the right middle field, the right lower field, the left upper field, the left middle field and the left lower field. She has no wheezes. She has rhonchi in the right middle field and the left upper field. She has no rales.  Faint, late, inspiratory rhonchi heard diffusely and bilaterally.  Abdominal: Soft. Bowel sounds are normal. She exhibits no mass. There is no tenderness. There is no rebound.  Musculoskeletal: Normal range of motion. She exhibits no edema, tenderness or deformity.  Lymphadenopathy:    She has no cervical adenopathy.  Neurological: She is alert and oriented to person, place, and time.  Skin: Skin is warm and dry. No rash noted. She is not diaphoretic. No erythema. No pallor.  Vitals reviewed.   Lab Results  Component Value Date   WBC 8.8 12/28/2017   HGB 11.1 (L) 12/28/2017   HCT 33.4 (L) 12/28/2017   PLT 274.0 12/28/2017   GLUCOSE 89 12/28/2017   CHOL 208 (H) 12/28/2017   TRIG (H) 12/28/2017    442.0 Triglyceride is over 400; calculations on Lipids are invalid.   HDL 52.00 12/28/2017   LDLDIRECT 100.0 12/28/2017   ALT 17 12/28/2017   AST 16 12/28/2017   NA 138 12/28/2017   K 3.8 12/28/2017   CL 102 12/28/2017   CREATININE 1.24 (H) 12/28/2017   BUN 22 12/28/2017   CO2 26 12/28/2017   TSH 7.16 (H) 12/28/2017   INR 1.17 08/17/2012   HGBA1C 5.8 12/28/2017    No results found.  Assessment & Plan:   Ashlen was seen today for cough, anemia and hypertension.  Diagnoses and all orders for this visit:  Essential hypertension- Her blood  pressure is well controlled.  Electrolytes and renal function are normal. -     CBC with Differential/Platelet; Future -     Comprehensive metabolic panel; Future  Deficiency anemia- Her H&H are low and she has folate and iron deficiency. -     CBC with Differential/Platelet; Future -     IBC panel; Future -     Vitamin B12; Future -     Ferritin; Future -     Folate; Future -     Vitamin B1; Future  Prediabetes- Her A1c is barely elevated at 5.8%.  Medical therapy is not indicated. -     Hemoglobin A1c; Future  Hyperlipidemia LDL goal <130- She has achieved her LDL goal is doing well  on the statin. -     Lipid panel; Future -     TSH; Future  COPD (chronic obstructive pulmonary disease) with chronic bronchitis ([place])- will start a nebulized LAMA to treat this -     Glycopyrrolate (LONHALA MAGNAIR REFILL KIT) 25 MCG/ML SOLN; Inhale 1 Act into the lungs 2 (two) times daily. -     Glycopyrrolate (LONHALA MAGNAIR STARTER KIT) 25 MCG/ML SOLN; Inhale 1 Act into the lungs 2 (two) times daily.  Need for influenza vaccination -     Flu vaccine HIGH DOSE PF (Fluzone High dose)  Need for Tdap vaccination -     Tdap vaccine greater than or equal to 7yo IM  Colon cancer screening -     Cologuard  GAD (generalized anxiety disorder) -     LORazepam (ATIVAN) 1 MG tablet; Take 1 tablet (1 mg total) by mouth daily.  Dietary folate deficiency anemia -     folic acid (FOLVITE) 1 MG tablet; Take 1 tablet (1 mg total) by mouth daily.  Other iron deficiency anemia -     ferrous sulfate 325 (65 FE) MG tablet; Take 1 tablet (325 mg total) by mouth daily with breakfast.  TSH elevation- She has a mild elevation in her TSH but appears euthyroid.  I do not think thyroid replacement therapy is indicated at this time.  I will continue to monitor this in the future.  Pure hyperglyceridemia -     Icosapent Ethyl (VASCEPA) 1 g CAPS; Take 2 capsules by mouth 2 (two) times daily.   I have discontinued  Chelcey D. Andrew's atenolol and ondansetron. I have also changed her LORazepam. Additionally, I am having her start on Glycopyrrolate, Glycopyrrolate, folic acid, ferrous sulfate, and Icosapent Ethyl. Lastly, I am having her maintain her rosuvastatin, omeprazole, cetirizine, escitalopram, cilostazol, buPROPion, amitriptyline, diphenoxylate-atropine, montelukast, Olmesartan-Amlodipine-HCTZ, fluticasone, triamcinolone, eszopiclone, and Olopatadine HCl.  Meds ordered this encounter  Medications  . Glycopyrrolate (LONHALA MAGNAIR REFILL KIT) 25 MCG/ML SOLN    Sig: Inhale 1 Act into the lungs 2 (two) times daily.    Dispense:  60 mL    Refill:  11  . Glycopyrrolate (LONHALA MAGNAIR STARTER KIT) 25 MCG/ML SOLN    Sig: Inhale 1 Act into the lungs 2 (two) times daily.    Dispense:  60 mL    Refill:  1  . LORazepam (ATIVAN) 1 MG tablet    Sig: Take 1 tablet (1 mg total) by mouth daily.    Dispense:  60 tablet    Refill:  2  . folic acid (FOLVITE) 1 MG tablet    Sig: Take 1 tablet (1 mg total) by mouth daily.    Dispense:  90 tablet    Refill:  1  . ferrous sulfate 325 (65 FE) MG tablet    Sig: Take 1 tablet (325 mg total) by mouth daily with breakfast.    Dispense:  180 tablet    Refill:  1  . Icosapent Ethyl (VASCEPA) 1 g CAPS    Sig: Take 2 capsules by mouth 2 (two) times daily.    Dispense:  360 capsule    Refill:  1     Follow-up: Return in about 3 months (around 03/28/2018).  Scarlette Calico, MD

## 2017-12-28 NOTE — Telephone Encounter (Signed)
Patient's insurance will be verified and I will call patient back to discuss summary of benefits

## 2017-12-28 NOTE — Telephone Encounter (Signed)
-----   Message from [facility], [place] sent at 12/28/2017  3:42 PM EST ----- Regarding: Prolia Pt was receiving the prolia injection. Can she receive again?

## 2017-12-28 NOTE — Patient Instructions (Signed)

## 2017-12-29 ENCOUNTER — Encounter: Payer: Self-pay | Admitting: Internal Medicine

## 2017-12-29 DIAGNOSIS — E781 Pure hyperglyceridemia: Secondary | ICD-10-CM | POA: Insufficient documentation

## 2017-12-29 DIAGNOSIS — R7989 Other specified abnormal findings of blood chemistry: Secondary | ICD-10-CM | POA: Insufficient documentation

## 2017-12-29 MED ORDER — ICOSAPENT ETHYL 1 G PO CAPS: 2.0000 | ORAL_CAPSULE | Freq: Two times a day (BID) | ORAL | 1 refills | Status: DC

## 2017-12-29 MED ORDER — FERROUS SULFATE 325 (65 FE) MG PO TABS
325.0000 mg | ORAL_TABLET | Freq: Every day | ORAL | 1 refills | Status: DC
Start: 2017-12-29 — End: 2019-02-14

## 2018-01-02 ENCOUNTER — Other Ambulatory Visit: Payer: Self-pay | Admitting: Internal Medicine

## 2018-01-02 DIAGNOSIS — E519 Thiamine deficiency, unspecified: Secondary | ICD-10-CM

## 2018-01-02 LAB — VITAMIN B1

## 2018-01-02 MED ORDER — VITAMIN B-1 100 MG PO TABS: 100.0000 mg | ORAL_TABLET | Freq: Every day | ORAL | 1 refills | Status: DC

## 2018-01-10 DIAGNOSIS — Z9002 Acquired absence of larynx: Secondary | ICD-10-CM | POA: Diagnosis not present

## 2018-01-10 DIAGNOSIS — R1319 Other dysphagia: Secondary | ICD-10-CM | POA: Diagnosis not present

## 2018-01-10 DIAGNOSIS — R633 Feeding difficulties: Secondary | ICD-10-CM | POA: Diagnosis not present

## 2018-01-10 DIAGNOSIS — R948 Abnormal results of function studies of other organs and systems: Secondary | ICD-10-CM | POA: Diagnosis not present

## 2018-01-10 DIAGNOSIS — R1313 Dysphagia, pharyngeal phase: Secondary | ICD-10-CM | POA: Diagnosis not present

## 2018-01-12 ENCOUNTER — Telehealth: Payer: Self-pay | Admitting: Nurse Practitioner

## 2018-01-12 NOTE — Telephone Encounter (Signed)
Copied from [place] [id number]. Topic: General - Other >> Jan 12, 2018  9:50 AM Synthia Innocent wrote: Unable to reach patient, requesting office to call patient and make sure she received Glycopyrrolate (LONHALA MAGNAIR STARTER KIT) 25 MCG/ML SOLN

## 2018-01-18 DIAGNOSIS — K224 Dyskinesia of esophagus: Secondary | ICD-10-CM | POA: Diagnosis not present

## 2018-01-18 DIAGNOSIS — R490 Dysphonia: Secondary | ICD-10-CM | POA: Diagnosis not present

## 2018-01-18 DIAGNOSIS — K222 Esophageal obstruction: Secondary | ICD-10-CM | POA: Diagnosis not present

## 2018-01-18 DIAGNOSIS — R1319 Other dysphagia: Secondary | ICD-10-CM | POA: Diagnosis not present

## 2018-01-18 DIAGNOSIS — Z79899 Other long term (current) drug therapy: Secondary | ICD-10-CM | POA: Diagnosis not present

## 2018-01-26 ENCOUNTER — Telehealth: Payer: Self-pay

## 2018-01-26 NOTE — Telephone Encounter (Signed)
Left message advising patient that insurance has been verified for prolia injections---she can call to schedule her first prolia injection at her earliest convenience--estimated $0 copay---she can schedule nurse visit or can get injection at her next office visit---can talk with Jaramiah Bossard,RN at [place] office if any further questions

## 2018-02-17 ENCOUNTER — Other Ambulatory Visit: Payer: Self-pay | Admitting: Internal Medicine

## 2018-02-17 ENCOUNTER — Encounter: Payer: Self-pay | Admitting: Internal Medicine

## 2018-02-17 ENCOUNTER — Ambulatory Visit (INDEPENDENT_AMBULATORY_CARE_PROVIDER_SITE_OTHER): Payer: Medicare Other | Admitting: *Deleted

## 2018-02-17 DIAGNOSIS — M810 Age-related osteoporosis without current pathological fracture: Secondary | ICD-10-CM

## 2018-02-17 MED ORDER — DENOSUMAB 60 MG/ML ~~LOC~~ SOLN
60.0000 mg | Freq: Once | SUBCUTANEOUS | Status: AC
  Administered 2018-02-17: 60 mg via SUBCUTANEOUS

## 2018-02-17 NOTE — Telephone Encounter (Signed)
Appt scheduled

## 2018-02-17 NOTE — Telephone Encounter (Signed)
error:[id number] ° °

## 2018-02-17 NOTE — Telephone Encounter (Addendum)
Caller name: Gramisci,Jessica  Relation to pt: daughter in law Call back number: [phone]   Reason for call:  Patient scheduled with  nurse today for prolia injection

## 2018-03-08 DIAGNOSIS — J392 Other diseases of pharynx: Secondary | ICD-10-CM | POA: Diagnosis not present

## 2018-03-08 DIAGNOSIS — K225 Diverticulum of esophagus, acquired: Secondary | ICD-10-CM | POA: Diagnosis not present

## 2018-03-08 DIAGNOSIS — R1310 Dysphagia, unspecified: Secondary | ICD-10-CM | POA: Diagnosis not present

## 2018-03-08 DIAGNOSIS — K222 Esophageal obstruction: Secondary | ICD-10-CM | POA: Diagnosis not present

## 2018-03-08 DIAGNOSIS — Z9002 Acquired absence of larynx: Secondary | ICD-10-CM | POA: Diagnosis not present

## 2018-03-08 DIAGNOSIS — Z8521 Personal history of malignant neoplasm of larynx: Secondary | ICD-10-CM | POA: Diagnosis not present

## 2018-03-08 DIAGNOSIS — Z923 Personal history of irradiation: Secondary | ICD-10-CM | POA: Diagnosis not present

## 2018-03-10 DIAGNOSIS — R490 Dysphonia: Secondary | ICD-10-CM | POA: Diagnosis not present

## 2018-03-17 ENCOUNTER — Other Ambulatory Visit: Payer: Self-pay | Admitting: Internal Medicine

## 2018-03-17 DIAGNOSIS — F411 Generalized anxiety disorder: Secondary | ICD-10-CM

## 2018-03-17 MED ORDER — LORAZEPAM 1 MG PO TABS: 1.0000 mg | ORAL_TABLET | Freq: Every day | ORAL | 0 refills | Status: DC

## 2018-03-17 NOTE — Telephone Encounter (Signed)
Copied from [place] [id number]. Topic: Quick Communication - Rx Refill/Question >> Mar 17, 2018 11:14 AM Percell Belt A wrote: Medication: LORazepam (ATIVAN) 1 MG tablet [[id number]] Has the patient contacted their pharmacy? Yes  (Agent: If no, request that the patient contact the pharmacy for the refill.) Preferred Pharmacy (with phone number or street name): [facility] [address], [place] is going out [place] the [place] and needs to fill this med early  Agent: Please be advised that RX refills may take up to 3 business days. We ask that you follow-up with your pharmacy.

## 2018-03-17 NOTE — Telephone Encounter (Signed)
Rx refill request: Ativan 1 mg-  Requesting early refill- going out of country  LOV: 12/28/17  PCP: [place]: verified

## 2018-03-20 ENCOUNTER — Other Ambulatory Visit: Payer: Self-pay | Admitting: Internal Medicine

## 2018-08-09 ENCOUNTER — Telehealth: Payer: Self-pay | Admitting: Internal Medicine

## 2018-08-09 NOTE — Telephone Encounter (Signed)
Insurance has been submitted and verified for Prolia. Patient is responsible for a $0 copay. Due on or after 08/21/2018. Left message for patient to call back to schedule.  Okay to schedule... Visit Note: Prolia ($0 copay - okay to give per Gareth Eagle) Visit Type: Nurse Provider: Nurse

## 2018-08-17 ENCOUNTER — Other Ambulatory Visit: Payer: Self-pay | Admitting: Internal Medicine

## 2018-08-17 DIAGNOSIS — F411 Generalized anxiety disorder: Secondary | ICD-10-CM

## 2018-08-22 DIAGNOSIS — Z978 Presence of other specified devices: Secondary | ICD-10-CM | POA: Diagnosis not present

## 2018-08-22 DIAGNOSIS — R490 Dysphonia: Secondary | ICD-10-CM | POA: Diagnosis not present

## 2018-08-22 DIAGNOSIS — Z9002 Acquired absence of larynx: Secondary | ICD-10-CM | POA: Diagnosis not present

## 2018-08-22 DIAGNOSIS — R1314 Dysphagia, pharyngoesophageal phase: Secondary | ICD-10-CM | POA: Diagnosis not present

## 2018-08-22 DIAGNOSIS — Z7189 Other specified counseling: Secondary | ICD-10-CM | POA: Diagnosis not present

## 2018-08-22 DIAGNOSIS — J3489 Other specified disorders of nose and nasal sinuses: Secondary | ICD-10-CM | POA: Diagnosis not present

## 2018-08-24 DIAGNOSIS — K1379 Other lesions of oral mucosa: Secondary | ICD-10-CM | POA: Diagnosis not present

## 2018-08-24 DIAGNOSIS — Z87891 Personal history of nicotine dependence: Secondary | ICD-10-CM | POA: Diagnosis not present

## 2018-08-24 DIAGNOSIS — J300 Vasomotor rhinitis: Secondary | ICD-10-CM | POA: Diagnosis not present

## 2018-08-24 DIAGNOSIS — T171XXA Foreign body in nostril, initial encounter: Secondary | ICD-10-CM | POA: Diagnosis not present

## 2018-08-24 DIAGNOSIS — J3489 Other specified disorders of nose and nasal sinuses: Secondary | ICD-10-CM | POA: Diagnosis not present

## 2018-08-27 ENCOUNTER — Other Ambulatory Visit: Payer: Self-pay | Admitting: Internal Medicine

## 2018-08-27 DIAGNOSIS — F411 Generalized anxiety disorder: Secondary | ICD-10-CM

## 2018-09-08 DIAGNOSIS — R1314 Dysphagia, pharyngoesophageal phase: Secondary | ICD-10-CM | POA: Diagnosis not present

## 2018-09-08 DIAGNOSIS — Z923 Personal history of irradiation: Secondary | ICD-10-CM | POA: Diagnosis not present

## 2018-09-08 DIAGNOSIS — Z9221 Personal history of antineoplastic chemotherapy: Secondary | ICD-10-CM | POA: Diagnosis not present

## 2018-09-08 DIAGNOSIS — Z9002 Acquired absence of larynx: Secondary | ICD-10-CM | POA: Diagnosis not present

## 2018-09-08 DIAGNOSIS — Z87891 Personal history of nicotine dependence: Secondary | ICD-10-CM | POA: Diagnosis not present

## 2018-09-08 DIAGNOSIS — Z8521 Personal history of malignant neoplasm of larynx: Secondary | ICD-10-CM | POA: Diagnosis not present

## 2018-09-28 DIAGNOSIS — Z9002 Acquired absence of larynx: Secondary | ICD-10-CM | POA: Diagnosis not present

## 2018-09-28 DIAGNOSIS — E041 Nontoxic single thyroid nodule: Secondary | ICD-10-CM | POA: Diagnosis not present

## 2018-10-18 DIAGNOSIS — Z9002 Acquired absence of larynx: Secondary | ICD-10-CM | POA: Diagnosis not present

## 2018-10-18 DIAGNOSIS — J392 Other diseases of pharynx: Secondary | ICD-10-CM | POA: Diagnosis not present

## 2018-10-18 DIAGNOSIS — Z923 Personal history of irradiation: Secondary | ICD-10-CM | POA: Diagnosis not present

## 2018-10-18 DIAGNOSIS — R1310 Dysphagia, unspecified: Secondary | ICD-10-CM | POA: Diagnosis not present

## 2018-10-18 DIAGNOSIS — Z9221 Personal history of antineoplastic chemotherapy: Secondary | ICD-10-CM | POA: Diagnosis not present

## 2018-10-18 DIAGNOSIS — R1319 Other dysphagia: Secondary | ICD-10-CM | POA: Diagnosis not present

## 2018-10-21 ENCOUNTER — Emergency Department (HOSPITAL_COMMUNITY): Payer: Medicare Other

## 2018-10-21 ENCOUNTER — Other Ambulatory Visit: Payer: Self-pay

## 2018-10-21 ENCOUNTER — Encounter (HOSPITAL_COMMUNITY): Payer: Self-pay | Admitting: Emergency Medicine

## 2018-10-21 ENCOUNTER — Emergency Department (HOSPITAL_COMMUNITY)
Admission: EM | Admit: 2018-10-21 | Discharge: 2018-10-21 | Disposition: A | Discharge status: Home / Self Care | Payer: Medicare Other | Attending: Emergency Medicine | Admitting: Emergency Medicine

## 2018-10-21 DIAGNOSIS — Z8542 Personal history of malignant neoplasm of other parts of uterus: Secondary | ICD-10-CM | POA: Diagnosis not present

## 2018-10-21 DIAGNOSIS — S8992XA Unspecified injury of left lower leg, initial encounter: Secondary | ICD-10-CM | POA: Diagnosis not present

## 2018-10-21 DIAGNOSIS — M79662 Pain in left lower leg: Secondary | ICD-10-CM | POA: Diagnosis not present

## 2018-10-21 DIAGNOSIS — Y92009 Unspecified place in unspecified non-institutional (private) residence as the place of occurrence of the external cause: Secondary | ICD-10-CM | POA: Insufficient documentation

## 2018-10-21 DIAGNOSIS — S99912A Unspecified injury of left ankle, initial encounter: Secondary | ICD-10-CM | POA: Diagnosis not present

## 2018-10-21 DIAGNOSIS — M25572 Pain in left ankle and joints of left foot: Secondary | ICD-10-CM | POA: Diagnosis not present

## 2018-10-21 DIAGNOSIS — Z87891 Personal history of nicotine dependence: Secondary | ICD-10-CM | POA: Insufficient documentation

## 2018-10-21 DIAGNOSIS — Y9301 Activity, walking, marching and hiking: Secondary | ICD-10-CM | POA: Insufficient documentation

## 2018-10-21 DIAGNOSIS — S82002A Unspecified fracture of left patella, initial encounter for closed fracture: Secondary | ICD-10-CM | POA: Insufficient documentation

## 2018-10-21 DIAGNOSIS — S79922A Unspecified injury of left thigh, initial encounter: Secondary | ICD-10-CM | POA: Diagnosis not present

## 2018-10-21 DIAGNOSIS — M79652 Pain in left thigh: Secondary | ICD-10-CM | POA: Diagnosis not present

## 2018-10-21 DIAGNOSIS — M79672 Pain in left foot: Secondary | ICD-10-CM | POA: Diagnosis not present

## 2018-10-21 DIAGNOSIS — Z79899 Other long term (current) drug therapy: Secondary | ICD-10-CM | POA: Diagnosis not present

## 2018-10-21 DIAGNOSIS — J449 Chronic obstructive pulmonary disease, unspecified: Secondary | ICD-10-CM | POA: Insufficient documentation

## 2018-10-21 DIAGNOSIS — Y999 Unspecified external cause status: Secondary | ICD-10-CM | POA: Insufficient documentation

## 2018-10-21 DIAGNOSIS — W109XXA Fall (on) (from) unspecified stairs and steps, initial encounter: Secondary | ICD-10-CM | POA: Diagnosis not present

## 2018-10-21 MED ORDER — OXYCODONE HCL 5 MG/5ML PO SOLN: 5.0000 mg | Freq: Four times a day (QID) | ORAL | 0 refills | Status: DC | PRN

## 2018-10-21 MED ORDER — FENTANYL CITRATE (PF) 100 MCG/2ML IJ SOLN
100.0000 ug | Freq: Once | INTRAMUSCULAR | Status: AC
  Administered 2018-10-21: 100 ug via INTRAVENOUS
  Filled 2018-10-21: qty 2

## 2018-10-21 NOTE — ED Notes (Signed)
ED Provider at bedside. 

## 2018-10-21 NOTE — ED Provider Notes (Signed)
[place] DEPT Provider Note   CSN: [id number] Arrival date & time: 10/21/18  1330     History   Chief Complaint Chief Complaint  Patient presents with  . Fall  . Knee Pain    HPI [name] is a 72 y.o. female patient with history of anginal pain, constipation, COPD, GERD, who presents for evaluation of left lower extremity pain that has been ongoing for the last 5 days.  Patient reports that approximate 5 days ago, she slipped on a piece of carpet as she was walking down a step and fell onto her room.  She denies any preceding chest pain or dizziness.  She states that when she went down, she bent her leg backwards.  She states she did not hit her head and have any LOC.  She reports that initially, she was able to get up and ambulate on the left lower extremity but states that over last 5 days, the pain is significantly worsened.  She had a previous surgery as scheduled on Tuesday which she was given oxycodone.  Patient reports she has been taking that with no improvement in her pain.  Patient states that she cannot even move it because it hurts so much.  Patient denies any numbness/weakness, neck pain, back pain, chest pain, difficulty breathing, abdominal pain, nausea/vomiting.  Patient is on any blood thinners.  The history is provided by the patient.    Past Medical History:  Diagnosis Date  . Anginal pain ([place])    ?  [name] Anxiety    takes Ativan prn  . Blood transfusion without reported diagnosis 09/15/12   2 units Prbc's  . Broken ribs   . Chronic back pain   . Constipation    related to pain meds  . COPD (chronic obstructive pulmonary disease) ([place]) 08/10/2012   denies  . Depression   . Gastrostomy in place [facility])    removed  . GERD (gastroesophageal reflux disease)    takes Zantac daily  . Headache(784.0)   . Hiatal hernia 08/10/2012  . History of radiation therapy 10/17/12-11/25/12   supraglottic larynx,high risk neck tumor bed 5880  cGy/28 sessions, high risk lymph node tumor bed 5600 cGy/20 sessions, mod risk lymph node tumor bed 5040 cGy/20 sessions  . Hypercholesteremia    takes Pravastatin daily  . Hypertension    takes Tribenzor and Atenolol daily  . Insomnia    takes Amitriptyline daily  . Nausea    takes Zofran prn  . Pneumonia   . 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 Shortness of breath dyspnea   . Stroke [facility]) 2011   denies. no residual  . Uterine cancer [facility])     Patient Active Problem List   Diagnosis Date Noted  . Thiamine deficiency 01/02/2018  . TSH elevation 12/29/2017  . Pure hyperglyceridemia 12/29/2017  . Prediabetes 12/28/2017  . Hyperlipidemia LDL goal <130 12/28/2017  . COPD (chronic obstructive pulmonary disease) with chronic bronchitis ([place]) 12/28/2017  . Dietary folate deficiency anemia 12/28/2017  . Moderate episode of recurrent major depressive disorder ([place]) 10/22/2017  . Dysphagia, pharyngoesophageal phase 05/22/2014  . History of radiation therapy   . Chronic pain syndrome 09/03/2012  . Absolute anemia 08/12/2012  . Tobacco abuse 08/10/2012  . COPD (chronic obstructive pulmonary disease) ([place]) 08/10/2012  . 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 08/08/2012    Class: Chronic  . Hypertension   . GAD (generalized anxiety disorder)  Past Surgical History:  Procedure Laterality Date  . ABDOMINAL SURGERY     r/t uterine carcinoma  . APPENDECTOMY    . DIRECT LARYNGOSCOPY N/A 05/22/2014   Procedure: DIRECT LARYNGOSCOPY WITH ESOPHAGEAL DILATION;  Surgeon: Jerrell Belfast, MD;  Location: [place];  Service: ENT;  Laterality: N/A;  . ESOPHAGOSCOPY WITH DILITATION N/A 05/29/2015   Procedure: ESOPHAGOSCOPY WITH ESOPHAGEAL DILITATION;  Surgeon: Jerrell Belfast, MD;  Location[facility] OR;  Service: ENT;  Laterality: N/A;  . Gastrostomy Tube removed   2013  . GASTROSTOMY W/ FEEDING TUBE  12  . GASTROTOMY    . HERNIA REPAIR     child  . LARYNGETOMY   08/31/2012   Procedure: LARYNGECTOMY;  Surgeon: Jerrell Belfast, MD;  Location: [place];  Service: ENT;  Laterality: N/A;  . LARYNGOSCOPY  08/10/2012   Procedure: LARYNGOSCOPY;  Surgeon: Jerrell Belfast, MD;  Location: [facility];  Service: ENT;  Laterality: N/A;  with biopsy  . RADICAL NECK DISSECTION  08/31/2012   Procedure: RADICAL NECK DISSECTION;  Surgeon: Jerrell Belfast, MD;  Location: [place];  Service: ENT;  Laterality: Bilateral;  . TRACHEAL ESOPHOGEAL PUNCTURE WITH REPAIR STOMA N/A 09/08/2013   Procedure: TRACHEAL ESOPHOGEAL PUNCTURE WITH PLACEMENT OF  PROVOX PROSTHESIS ;  Surgeon: Jerrell Belfast, MD;  Location: [place];  Service: ENT;  Laterality: N/A;  . TRACHEOSTOMY TUBE PLACEMENT  08/10/2012   Procedure: TRACHEOSTOMY;  Surgeon: Jerrell Belfast, MD;  Location: [facility];  Service: ENT;  Laterality: N/A;     OB History   None      Home Medications    Prior to Admission medications   Medication Sig Start Date End Date Taking? Authorizing Provider  atenolol (TENORMIN) 25 MG tablet TAKE ONE TABLET (25 MG TOTAL) BY MOUTH 2 (TWO) TIMES DAILY. Patient taking differently: Take 25 mg by mouth daily.  03/17/18  Yes Janith Lima, MD  buPROPion (WELLBUTRIN XL) 150 MG 24 hr tablet Take 150 mg daily by mouth.   Yes [provider]  cilostazol (PLETAL) 100 MG tablet TAKE 1 TABLET BY MOUTH TWICE A DAY Patient taking differently: Take 100 mg by mouth daily.  03/20/18  Yes Janith Lima, MD  Ibuprofen-Famotidine (DUEXIS) 800-26.6 MG TABS Take 1 tablet by mouth 3 (three) times daily as needed (pain).   Yes [provider]  LORazepam (ATIVAN) 1 MG tablet TAKE 1 TABLET BY MOUTH EVERY DAY Patient taking differently: Take 1 mg by mouth daily.  08/17/18  Yes Janith Lima, MD  montelukast (SINGULAIR) 10 MG tablet TAKE 1 TABLET BY MOUTH EVERYDAY AT BEDTIME Patient taking differently: Take 10 mg by mouth daily.  03/17/18  Yes Janith Lima, MD  Olmesartan-amLODIPine-HCTZ 20-5-12.5 MG TABS Take  0.5 tablets by mouth daily. 03/17/18  Yes Janith Lima, MD  omeprazole (PRILOSEC) 20 MG capsule TAKE ONE CAPSULE BY MOUTH 2 TIMES DAILY. Patient taking differently: Take 20 mg by mouth daily.  03/17/18  Yes Janith Lima, MD  rosuvastatin (CRESTOR) 20 MG tablet TAKE ONE TABLET (20 MG TOTAL) BY MOUTH AT BEDTIME. Patient taking differently: Take 20 mg by mouth daily.  03/17/18  Yes Janith Lima, MD  cetirizine (ZYRTEC) 10 MG tablet TAKE 1 TABLET BY MOUTH EVERY DAY Patient not taking: Reported on 10/21/2018 02/18/18   Janith Lima, MD  escitalopram (LEXAPRO) 20 MG tablet TAKE ONE TABLET (20 MG TOTAL) BY MOUTH DAILY. Patient not taking: Reported on 10/21/2018 03/17/18   Janith Lima, MD  ferrous  sulfate 325 (65 FE) MG tablet Take 1 tablet (325 mg total) by mouth daily with breakfast. Patient not taking: Reported on 10/21/2018 12/29/17   Janith Lima, MD  folic acid (FOLVITE) 1 MG tablet Take 1 tablet (1 mg total) by mouth daily. Patient not taking: Reported on 10/21/2018 12/28/17   Janith Lima, MD  Glycopyrrolate [facility] REFILL KIT) 25 MCG/ML SOLN Inhale 1 Act into the lungs 2 (two) times daily. Patient not taking: Reported on 10/21/2018 12/28/17   Janith Lima, MD  Glycopyrrolate [facility] MAGNAIR STARTER KIT) 25 MCG/ML SOLN Inhale 1 Act into the lungs 2 (two) times daily. Patient not taking: Reported on 10/21/2018 12/28/17   Janith Lima, MD  Icosapent Ethyl (VASCEPA) 1 g CAPS Take 2 capsules by mouth 2 (two) times daily. Patient not taking: Reported on 10/21/2018 12/29/17   Janith Lima, MD  LORazepam (ATIVAN) 1 MG tablet TAKE 1 TABLET BY MOUTH EVERY DAY Patient not taking: Reported on 10/21/2018 08/28/18   Janith Lima, MD  oxyCODONE (ROXICODONE) 5 MG/5ML solution Take 5 mLs (5 mg total) by mouth every 6 (six) hours as needed for severe pain. 10/21/18   Volanda Napoleon, PA-C  thiamine (VITAMIN B-1) 100 MG tablet Take 1 tablet (100 mg total) by mouth daily. Patient not taking:  Reported on 10/21/2018 01/02/18   Janith Lima, MD    Family History No family history on file.  Social History Social History   Tobacco Use  . Smoking status: Former Smoker    Packs/day: 0.25    Years: 50.00    Pack years: 12.50    Types: Cigarettes    Last attempt to quit: 05/27/2013    Years since quitting: 5.4  . Smokeless tobacco: Never Used  Substance Use Topics  . Alcohol use: No  . Drug use: No     Allergies   Bacitracin   Review of Systems Review of Systems  Respiratory: Negative for cough and shortness of breath.   Cardiovascular: Negative for chest pain.  Gastrointestinal: Negative for abdominal pain, nausea and vomiting.  Genitourinary: Negative for dysuria and hematuria.  Musculoskeletal:       LLE pain  Neurological: Negative for weakness, numbness and headaches.  All other systems reviewed and are negative.    Physical Exam Updated Vital Signs BP (!) 159/78 (BP Location: Left Arm)   Pulse (!) 59   Temp 98.5 F (36.9 C) (Oral)   Resp 18   SpO2 100%   Physical Exam  Constitutional: She is oriented to person, place, and time. She appears well-developed and well-nourished.  Appears uncomfortable  HENT:  Head: Normocephalic and atraumatic.  Mouth/Throat: Oropharynx is clear and moist and mucous membranes are normal.  Eyes: Pupils are equal, round, and reactive to light. Conjunctivae, EOM and lids are normal.  Neck: Full passive range of motion without pain.  Cardiovascular: Normal rate, regular rhythm, normal heart sounds and normal pulses. Exam reveals no gallop and no friction rub.  No murmur heard. Pulses:      Radial pulses are 2+ on the right side, and 2+ on the left side.       Dorsalis pedis pulses are 2+ on the right side, and 2+ on the left side.  Pulmonary/Chest: Effort normal and breath sounds normal.  Abdominal: Soft. Normal appearance. There is no tenderness. There is no rigidity and no guarding.  Musculoskeletal: Normal range of  motion.  Diffuse tenderness to palpation that extends from the  left femur, left knee, left tib-fib, left ankle.  Point tenderness noted to anterior aspect of the left knee with overlying soft tissue swelling.  No deformity or crepitus noted.  Limited flexion secondary to pain.  Extension intact any difficulty.  Tenderness palpation noted to the medial malleolus of the left ankle.  Limited dorsiflexion and plantarflexion secondary to pain.  No deformity or crepitus noted.  Patient can wiggle her toes but is decreased secondary to her normal movement.  No tenderness palpation of the right lower extremity.  Full flexion/extension of right lower extremity intact without any difficulty. No tenderness to palpation to bilateral shoulders, clavicles, elbows, and wrists. No deformities or crepitus noted. FROM of BUE without difficulty.   Neurological: She is alert and oriented to person, place, and time.  Sensation intact along major nerve distributions of LLE Difficulty moving LLE secondary to pain  Skin: Skin is warm and dry. Capillary refill takes less than 2 seconds.  Good distal cap refill. LLE is not dusky in appearance or cool to touch.  Psychiatric: She has a normal mood and affect. Her speech is normal.  Nursing note and vitals reviewed.    ED Treatments / Results  Labs (all labs ordered are listed, but only abnormal results are displayed) Labs Reviewed - No data to display  EKG None  Radiology Dg Tibia/fibula Left  Result Date: 10/21/2018 CLINICAL DATA:  Leg pain secondary to a fall 5 days ago. EXAM: LEFT TIBIA AND FIBULA - 2 VIEW COMPARISON:  None. FINDINGS: The tibia and fibula are intact. No appreciable effusions. No dislocation. IMPRESSION: Normal left lower leg.  [place] Electronically Signed   By: Lorriane Shire M.D.   On: 10/21/2018 16:42   Dg Ankle Complete Left  Result Date: 10/21/2018 CLINICAL DATA:  Pain secondary to a fall 5 days ago. EXAM: LEFT ANKLE COMPLETE - 3+ VIEW  COMPARISON:  None. FINDINGS: There is no evidence of fracture, dislocation, or joint effusion. There is no evidence of arthropathy or other focal bone abnormality. Soft tissues are unremarkable. IMPRESSION: Negative. Electronically Signed   By: Lorriane Shire M.D.   On: 10/21/2018 16:44   Dg Knee Complete 4 Views Left  Result Date: 10/21/2018 CLINICAL DATA:  Progressive leg pain after a fall five days ago. Left knee pain. Limited range of motion. EXAM: LEFT KNEE - COMPLETE 4+ VIEW COMPARISON:  None. FINDINGS: There is a slightly comminuted a primarily transverse fracture of the patella with only minimal distraction, 2 mm. The distal femur and the proximal tibia and fibula are normal. No joint effusion. IMPRESSION: Patellar fracture with only minimal distraction. Electronically Signed   By: Lorriane Shire M.D.   On: 10/21/2018 16:41   Dg Foot Complete Left  Result Date: 10/21/2018 CLINICAL DATA:  Pain secondary to a fall 5 days ago. EXAM: LEFT FOOT - COMPLETE 3+ VIEW COMPARISON:  None. FINDINGS: The patient is a hallux valgus deformity with bunion formation on the head of the first metatarsal. No fractures or dislocations. IMPRESSION: No acute abnormality.  Hallux valgus. Electronically Signed   By: Lorriane Shire M.D.   On: 10/21/2018 16:43   Dg Femur Min 2 Views Left  Result Date: 10/21/2018 CLINICAL DATA:  Worsening left lower extremity pain post fall. EXAM: LEFT FEMUR 2 VIEWS COMPARISON:  None. FINDINGS: There is no evidence of fracture or other focal bone lesions. Soft tissues are unremarkable. IMPRESSION: Negative. Electronically Signed   By: Fidela Salisbury M.D.   On: 10/21/2018 16:40  Procedures Procedures (including critical care time)  Medications Ordered in ED Medications  fentaNYL (SUBLIMAZE) injection 100 mcg (100 mcg Intravenous Given 10/21/18 1538)     Initial Impression / Assessment and Plan / ED Course  I have reviewed the triage vital signs and the nursing  notes.  Pertinent labs & imaging results that were available during my care of the patient were reviewed by me and considered in my medical decision making (see chart for details).     72 year old female who presents for evaluation of left lower extremity pain after mechanical fall 5 days ago.  No preceding chest pain, dizziness.  Patient states that she has been able to ambulate but does report worsening pain over the last 5 days.  No new trauma, injury, fall.  Did not hit her head or have any LOC.  She is not currently on any blood thinners. Patient is afebrile , non-toxic appearing, sitting comfortably on examination table. Vital signs reviewed.  She is hypertensive.  She does have a history of high blood pressure and states she did take her medications this morning.  We will plan to reevaluate after pain control.  Good DP pulses bilaterally.  Limited movement of left lower extremity secondary to pain.  We will plan to check basic x-rays for evaluation.  Given diffuse tenderness, will evaluate the entire left lower extremity.  X-ray shows patella fracture of the left knee.  X-rays are otherwise unremarkable.  Discussed results with patient and family.  Will place her in a knee immobilizer.  Repeat vitals show improved high blood pressure.  Additionally, will provide prescription for walker for her to take home with.  Will give short course of pain medication for pain.  Instructed patient that she will need outpatient orthopedic follow-up.  Encourage at home supportive care measures.  Patient had ample opportunity for questions and discussion. All patient's questions were answered with full understanding. Strict return precautions discussed. Patient expresses understanding and agreement to plan.    Final Clinical Impressions(s) / ED Diagnoses   Final diagnoses:  Closed nondisplaced fracture of left patella, unspecified fracture morphology, initial encounter    ED Discharge Orders          Ordered    Walker standard  Status:  Canceled     10/21/18 1734    Walker rolling     10/21/18 1735    oxyCODONE (ROXICODONE) 5 MG/5ML solution  Every 6 hours PRN     10/21/18 1749           Volanda Napoleon, PA-C 10/22/18 0012    Little, Wenda Overland, MD 10/23/18 0013

## 2018-10-21 NOTE — ED Triage Notes (Addendum)
Patient here from home with complaints of fall on Sunday. Pain increased, worse by Wednesday. Left knee pain, increased to back of knee. Unable to straighten knee.

## 2018-10-21 NOTE — Discharge Instructions (Signed)
You can take 1000 mg of Tylenol.  Do not exceed 4000 mg of Tylenol a day.  You can take pain medication for severe or breakthrough pain. This will make you drowsy and/or dizzy so do not drive while taking it.   Wear the knee immobilizer for support and stabilization.  [facility]  Their address is [address] [place], [place].  They will be able to fill the prescription for the walker.  Follow the RICE (Rest, Ice, Compression, Elevation) protocol as directed.   Follow-up with referred orthopedic doctor.  Call their office to arrange for an appointment.

## 2018-10-21 NOTE — ED Notes (Signed)
X-ray at bedside

## 2018-10-24 ENCOUNTER — Ambulatory Visit: Payer: Self-pay | Admitting: Internal Medicine

## 2018-10-24 DIAGNOSIS — Z0289 Encounter for other administrative examinations: Secondary | ICD-10-CM

## 2018-11-03 ENCOUNTER — Encounter: Payer: Self-pay | Admitting: Internal Medicine

## 2018-11-03 ENCOUNTER — Ambulatory Visit (INDEPENDENT_AMBULATORY_CARE_PROVIDER_SITE_OTHER): Payer: Medicare Other | Admitting: Internal Medicine

## 2018-11-03 DIAGNOSIS — M79605 Pain in left leg: Secondary | ICD-10-CM | POA: Diagnosis not present

## 2018-11-03 DIAGNOSIS — R197 Diarrhea, unspecified: Secondary | ICD-10-CM | POA: Diagnosis not present

## 2018-11-03 DIAGNOSIS — J441 Chronic obstructive pulmonary disease with (acute) exacerbation: Secondary | ICD-10-CM | POA: Diagnosis not present

## 2018-11-03 MED ORDER — LOPERAMIDE HCL 2 MG PO TABS: 2.0000 mg | ORAL_TABLET | Freq: Four times a day (QID) | ORAL | 0 refills | Status: DC | PRN

## 2018-11-03 MED ORDER — OXYCODONE HCL 5 MG/5ML PO SOLN: 5.0000 mg | Freq: Three times a day (TID) | ORAL | 0 refills | Status: DC | PRN

## 2018-11-03 MED ORDER — PREDNISONE 20 MG PO TABS: 40.0000 mg | ORAL_TABLET | Freq: Every day | ORAL | 0 refills | Status: DC

## 2018-11-03 MED ORDER — DOXYCYCLINE HYCLATE 100 MG PO TABS: 100.0000 mg | ORAL_TABLET | Freq: Two times a day (BID) | ORAL | 0 refills | Status: DC

## 2018-11-03 MED ORDER — FLUTICASONE PROPIONATE 50 MCG/ACT NA SUSP: 2.0000 | Freq: Every day | NASAL | 6 refills | Status: DC

## 2018-11-03 NOTE — Patient Instructions (Signed)
We have sent in doxycycline which is the antibiotic to take 1 pill twice a day for 1 week.   We have sent in the diarrhea medicine and the nose spray.  We have sent in prednisone to help with inflammation in the lungs to help you breathe better.   We have sent in some of the liquid pain medicine to help with the pain to use.

## 2018-11-03 NOTE — Progress Notes (Signed)
   Subjective:    Patient ID: [name], female    DOB: [birth year], 72 y.o.   MRN: [id number]  HPI The patient is a 72 YO female coming in for several concerns including cough (had some kind of procedure recently for esophagus, she is having some cough and SOB, white sputum, former smoker, has trach, thick mucus coming up the trach, has lonhala nebulizer but cannot get the machine to work, it is broken or sometime, history mostly from son who is interpretor as she speaks spanish) and left knee pain (recent fall and kneecap fracture, given some pain liquid for about 2-3 days and this did help, she is in extreme pain and cannot walk or sleep or eat due to pain, she denies walking much lately, is unable to do adls safely due to pain and instability), and diarrhea (she often gets this and is out of medication, denies blood in stool, denies new abdominal pain, prior workup of this possibly but long time ago).   Review of Systems  Constitutional: Positive for activity change, appetite change and chills. Negative for fatigue, fever and unexpected weight change.  HENT: Positive for congestion, postnasal drip, rhinorrhea and sinus pressure. Negative for ear discharge, ear pain, sinus pain, sneezing, sore throat, tinnitus, trouble swallowing and voice change.   Eyes: Negative.   Respiratory: Positive for cough, shortness of breath and wheezing. Negative for chest tightness.   Cardiovascular: Negative.   Gastrointestinal: Negative.   Musculoskeletal: Positive for arthralgias, back pain and myalgias. Negative for gait problem and joint swelling.  Skin: Negative.   Neurological: Negative.       Objective:   Physical Exam  Constitutional: She is oriented to person, place, and time. She appears well-developed and well-nourished. She appears distressed.  Appears in distress with trying to maneuver and move  HENT:  Head: Normocephalic and atraumatic.  Eyes: EOM are normal.  Neck: Normal range of  motion.  Cardiovascular: Normal rate and regular rhythm.  Pulmonary/Chest: Effort normal. No respiratory distress. She has wheezes. She has no rales.  Mild expiratory wheezing and some rhonchi bilaterally, trach in place  Abdominal: Soft. Bowel sounds are normal. She exhibits no distension. There is no tenderness. There is no rebound.  Musculoskeletal: She exhibits no edema.  Neurological: She is alert and oriented to person, place, and time. Coordination abnormal.  Skin: Skin is warm and dry.   Vitals:   11/03/18 1420  BP: 110/60  Pulse: 63  Temp: 98.4 F (36.9 C)  TempSrc: Oral  SpO2: 99%  Weight: 123 lb (55.8 kg)  Height: 5\' 2"  (1.575 m)      Assessment & Plan:

## 2018-11-04 DIAGNOSIS — M79605 Pain in left leg: Secondary | ICD-10-CM | POA: Insufficient documentation

## 2018-11-04 DIAGNOSIS — R197 Diarrhea, unspecified: Secondary | ICD-10-CM | POA: Insufficient documentation

## 2018-11-04 DIAGNOSIS — M79672 Pain in left foot: Secondary | ICD-10-CM | POA: Insufficient documentation

## 2018-11-04 NOTE — Assessment & Plan Note (Signed)
Rx for oxycodone liquid to use for pain and [place] narcotic database reviewed and no inappropriate fills. She is aware of the risk of death, sedation, addiction, dependence and wants to continue.

## 2018-11-04 NOTE — Assessment & Plan Note (Signed)
States while visiting family in [place] this was completely resolved and upon return to [place] it came back. Likely food related. Rx for imodium and asked son to keep food journal to see if they can link trigger.

## 2018-11-04 NOTE — Assessment & Plan Note (Signed)
With flare today, rx for doxycycline and prednisone. Advised them to call home health agency to help fix machine for lonhala.

## 2018-11-08 DIAGNOSIS — M25562 Pain in left knee: Secondary | ICD-10-CM | POA: Diagnosis not present

## 2018-11-14 ENCOUNTER — Other Ambulatory Visit: Payer: Self-pay | Admitting: Internal Medicine

## 2018-11-15 ENCOUNTER — Other Ambulatory Visit: Payer: Self-pay | Admitting: Internal Medicine

## 2018-11-15 MED ORDER — OLMESARTAN-AMLODIPINE-HCTZ 20-5-12.5 MG PO TABS: 0.5000 | ORAL_TABLET | Freq: Every day | ORAL | 0 refills | Status: DC

## 2018-11-15 NOTE — Telephone Encounter (Signed)
Requested Prescriptions  Pending Prescriptions Disp Refills  . Olmesartan-amLODIPine-HCTZ 20-5-12.5 MG TABS 90 tablet 0    Sig: Take 0.5 tablets by mouth daily.     Cardiovascular: CCB + ARB + Diuretic Combos Failed - 11/15/2018  1:46 PM      Failed - K in normal range and within 180 days    Potassium  Date Value Ref Range Status  12/28/2017 3.8 3.5 - 5.1 mEq/L Final         Failed - Na in normal range and within 180 days    Sodium  Date Value Ref Range Status  12/28/2017 138 135 - 145 mEq/L Final         Failed - Cr in normal range and within 180 days    Creatinine, Ser  Date Value Ref Range Status  12/28/2017 1.24 (H) 0.40 - 1.20 mg/dL Final         Failed - Ca in normal range and within 180 days    Calcium  Date Value Ref Range Status  12/28/2017 9.6 8.4 - 10.5 mg/dL Final         Passed - Patient is not pregnant      Passed - Last BP in normal range    BP Readings from Last 1 Encounters:  11/03/18 110/60         Passed - Last Heart Rate in normal range    Pulse Readings from Last 1 Encounters:  11/03/18 63         Passed - Valid encounter within last 6 months    Recent Outpatient Visits          1 week ago Chronic obstructive pulmonary disease with acute exacerbation ([place])   [place]ingal, Elizabeth A, MD   10 months ago Essential hypertension   [facility] -Mayer Camel, MD   1 year ago Chronic obstructive pulmonary disease, unspecified COPD type [facility])   [facility] -Nelle Don, Delphia Grates, NP

## 2018-11-15 NOTE — Telephone Encounter (Signed)
Copied from [facility] [phone]. Topic: Quick Communication - Rx Refill/Question >> Nov 15, 2018 11:00 AM Keene Breath wrote: Medication: Olmesartan-amLODIPine-HCTZ 20-5-12.5 MG TABS  Patient called to request refill on above medication.  Patient states she was unable to make previous appt. Because she had fallen and was unable to come to doctor's office.  She also states that she is out of medication  Preferred Pharmacy (with phone number or street name): [id number] - [place], [place] [phone] (Phone) [phone] (Fax)

## 2018-11-24 DIAGNOSIS — Z9889 Other specified postprocedural states: Secondary | ICD-10-CM | POA: Diagnosis not present

## 2018-11-24 DIAGNOSIS — M25562 Pain in left knee: Secondary | ICD-10-CM | POA: Diagnosis not present

## 2018-12-01 DIAGNOSIS — R1310 Dysphagia, unspecified: Secondary | ICD-10-CM | POA: Diagnosis not present

## 2018-12-01 DIAGNOSIS — Z9221 Personal history of antineoplastic chemotherapy: Secondary | ICD-10-CM | POA: Diagnosis not present

## 2018-12-01 DIAGNOSIS — Z9002 Acquired absence of larynx: Secondary | ICD-10-CM | POA: Diagnosis not present

## 2018-12-01 DIAGNOSIS — K1379 Other lesions of oral mucosa: Secondary | ICD-10-CM | POA: Diagnosis not present

## 2018-12-01 DIAGNOSIS — R491 Aphonia: Secondary | ICD-10-CM | POA: Diagnosis not present

## 2018-12-01 DIAGNOSIS — J392 Other diseases of pharynx: Secondary | ICD-10-CM | POA: Diagnosis not present

## 2018-12-01 DIAGNOSIS — R498 Other voice and resonance disorders: Secondary | ICD-10-CM | POA: Diagnosis not present

## 2018-12-01 DIAGNOSIS — K222 Esophageal obstruction: Secondary | ICD-10-CM | POA: Diagnosis not present

## 2018-12-01 DIAGNOSIS — R1319 Other dysphagia: Secondary | ICD-10-CM | POA: Diagnosis not present

## 2018-12-01 DIAGNOSIS — Z923 Personal history of irradiation: Secondary | ICD-10-CM | POA: Diagnosis not present

## 2018-12-01 DIAGNOSIS — Z87898 Personal history of other specified conditions: Secondary | ICD-10-CM | POA: Diagnosis not present

## 2018-12-01 DIAGNOSIS — Z8521 Personal history of malignant neoplasm of larynx: Secondary | ICD-10-CM | POA: Diagnosis not present

## 2019-01-03 DIAGNOSIS — G4440 Drug-induced headache, not elsewhere classified, not intractable: Secondary | ICD-10-CM | POA: Diagnosis not present

## 2019-01-03 DIAGNOSIS — R42 Dizziness and giddiness: Secondary | ICD-10-CM | POA: Diagnosis not present

## 2019-01-03 DIAGNOSIS — Z48813 Encounter for surgical aftercare following surgery on the respiratory system: Secondary | ICD-10-CM | POA: Diagnosis not present

## 2019-01-03 DIAGNOSIS — J392 Other diseases of pharynx: Secondary | ICD-10-CM | POA: Diagnosis not present

## 2019-01-03 DIAGNOSIS — Z9002 Acquired absence of larynx: Secondary | ICD-10-CM | POA: Diagnosis not present

## 2019-01-03 DIAGNOSIS — R1310 Dysphagia, unspecified: Secondary | ICD-10-CM | POA: Diagnosis not present

## 2019-01-03 DIAGNOSIS — J3489 Other specified disorders of nose and nasal sinuses: Secondary | ICD-10-CM | POA: Diagnosis not present

## 2019-01-03 DIAGNOSIS — T424X5A Adverse effect of benzodiazepines, initial encounter: Secondary | ICD-10-CM | POA: Diagnosis not present

## 2019-01-18 ENCOUNTER — Encounter

## 2019-01-19 ENCOUNTER — Other Ambulatory Visit (INDEPENDENT_AMBULATORY_CARE_PROVIDER_SITE_OTHER): Payer: Medicare Other

## 2019-01-19 ENCOUNTER — Encounter: Payer: Self-pay | Admitting: Internal Medicine

## 2019-01-19 ENCOUNTER — Ambulatory Visit (INDEPENDENT_AMBULATORY_CARE_PROVIDER_SITE_OTHER): Payer: Medicare Other | Admitting: Internal Medicine

## 2019-01-19 VITALS — BP 130/60 | HR 66 | Temp 98.6°F | Ht 62.0 in | Wt 128.0 lb

## 2019-01-19 DIAGNOSIS — N2889 Other specified disorders of kidney and ureter: Secondary | ICD-10-CM

## 2019-01-19 DIAGNOSIS — R300 Dysuria: Secondary | ICD-10-CM

## 2019-01-19 DIAGNOSIS — E781 Pure hyperglyceridemia: Secondary | ICD-10-CM

## 2019-01-19 DIAGNOSIS — E785 Hyperlipidemia, unspecified: Secondary | ICD-10-CM

## 2019-01-19 DIAGNOSIS — R7303 Prediabetes: Secondary | ICD-10-CM

## 2019-01-19 DIAGNOSIS — R7989 Other specified abnormal findings of blood chemistry: Secondary | ICD-10-CM

## 2019-01-19 DIAGNOSIS — R197 Diarrhea, unspecified: Secondary | ICD-10-CM | POA: Diagnosis not present

## 2019-01-19 DIAGNOSIS — F411 Generalized anxiety disorder: Secondary | ICD-10-CM

## 2019-01-19 DIAGNOSIS — E559 Vitamin D deficiency, unspecified: Secondary | ICD-10-CM | POA: Diagnosis not present

## 2019-01-19 DIAGNOSIS — M810 Age-related osteoporosis without current pathological fracture: Secondary | ICD-10-CM

## 2019-01-19 DIAGNOSIS — K2100 Gastro-esophageal reflux disease with esophagitis, without bleeding: Secondary | ICD-10-CM

## 2019-01-19 DIAGNOSIS — I10 Essential (primary) hypertension: Secondary | ICD-10-CM

## 2019-01-19 DIAGNOSIS — N185 Chronic kidney disease, stage 5: Secondary | ICD-10-CM | POA: Insufficient documentation

## 2019-01-19 DIAGNOSIS — D649 Anemia, unspecified: Secondary | ICD-10-CM

## 2019-01-19 DIAGNOSIS — D520 Dietary folate deficiency anemia: Secondary | ICD-10-CM | POA: Diagnosis not present

## 2019-01-19 DIAGNOSIS — I129 Hypertensive chronic kidney disease with stage 1 through stage 4 chronic kidney disease, or unspecified chronic kidney disease: Secondary | ICD-10-CM | POA: Insufficient documentation

## 2019-01-19 DIAGNOSIS — S82002S Unspecified fracture of left patella, sequela: Secondary | ICD-10-CM

## 2019-01-19 DIAGNOSIS — J301 Allergic rhinitis due to pollen: Secondary | ICD-10-CM

## 2019-01-19 DIAGNOSIS — N3001 Acute cystitis with hematuria: Secondary | ICD-10-CM

## 2019-01-19 DIAGNOSIS — E519 Thiamine deficiency, unspecified: Secondary | ICD-10-CM

## 2019-01-19 DIAGNOSIS — Z23 Encounter for immunization: Secondary | ICD-10-CM

## 2019-01-19 DIAGNOSIS — Z1211 Encounter for screening for malignant neoplasm of colon: Secondary | ICD-10-CM

## 2019-01-19 DIAGNOSIS — Z1231 Encounter for screening mammogram for malignant neoplasm of breast: Secondary | ICD-10-CM

## 2019-01-19 DIAGNOSIS — N184 Chronic kidney disease, stage 4 (severe): Secondary | ICD-10-CM

## 2019-01-19 DIAGNOSIS — M8580 Other specified disorders of bone density and structure, unspecified site: Secondary | ICD-10-CM

## 2019-01-19 LAB — POC URINALSYSI DIPSTICK (AUTOMATED)
Bilirubin, UA: NEGATIVE
Blood, UA: NEGATIVE
Glucose, UA: NEGATIVE
Ketones, UA: NEGATIVE
Nitrite, UA: POSITIVE
PROTEIN UA: POSITIVE — AB
Spec Grav, UA: 1.01 (ref 1.010–1.025)
Urobilinogen, UA: 0.2 E.U./dL
pH, UA: 7.5 (ref 5.0–8.0)

## 2019-01-19 LAB — TSH: TSH: 6.61 u[IU]/mL — ABNORMAL HIGH (ref 0.35–4.50)

## 2019-01-19 LAB — CBC WITH DIFFERENTIAL/PLATELET
Basophils Absolute: 0.1 10*3/uL (ref 0.0–0.1)
Basophils Relative: 0.8 % (ref 0.0–3.0)
EOS PCT: 1.3 % (ref 0.0–5.0)
Eosinophils Absolute: 0.1 10*3/uL (ref 0.0–0.7)
HCT: 32.4 % — ABNORMAL LOW (ref 36.0–46.0)
HEMOGLOBIN: 10.7 g/dL — AB (ref 12.0–15.0)
Lymphocytes Relative: 17.7 % (ref 12.0–46.0)
Lymphs Abs: 1.2 10*3/uL (ref 0.7–4.0)
MCHC: 33.1 g/dL (ref 30.0–36.0)
MCV: 81.4 fl (ref 78.0–100.0)
Monocytes Absolute: 0.5 10*3/uL (ref 0.1–1.0)
Monocytes Relative: 7.5 % (ref 3.0–12.0)
NEUTROS PCT: 72.7 % (ref 43.0–77.0)
Neutro Abs: 5.1 10*3/uL (ref 1.4–7.7)
Platelets: 270 10*3/uL (ref 150.0–400.0)
RBC: 3.99 Mil/uL (ref 3.87–5.11)
RDW: 14.6 % (ref 11.5–15.5)
WBC: 7 10*3/uL (ref 4.0–10.5)

## 2019-01-19 LAB — COMPREHENSIVE METABOLIC PANEL
ALT: 11 U/L (ref 0–35)
AST: 14 U/L (ref 0–37)
Albumin: 4.3 g/dL (ref 3.5–5.2)
Alkaline Phosphatase: 105 U/L (ref 39–117)
BUN: 28 mg/dL — ABNORMAL HIGH (ref 6–23)
CO2: 26 mEq/L (ref 19–32)
Calcium: 9.3 mg/dL (ref 8.4–10.5)
Chloride: 104 mEq/L (ref 96–112)
Creatinine, Ser: 1.6 mg/dL — ABNORMAL HIGH (ref 0.40–1.20)
GFR: 31.59 mL/min — ABNORMAL LOW (ref >60.00)
GLUCOSE: 92 mg/dL (ref 70–99)
Potassium: 3.9 mEq/L (ref 3.5–5.1)
Sodium: 141 mEq/L (ref 135–145)
Total Bilirubin: 0.4 mg/dL (ref 0.2–1.2)
Total Protein: 7 g/dL (ref 6.0–8.3)

## 2019-01-19 LAB — VITAMIN B12: Vitamin B-12: 472 pg/mL (ref 211–911)

## 2019-01-19 LAB — LIPID PANEL
CHOLESTEROL: 190 mg/dL (ref 0–200)
HDL: 46.7 mg/dL (ref >39.00)
NonHDL: 142.97
Total CHOL/HDL Ratio: 4
Triglycerides: 232 mg/dL — ABNORMAL HIGH (ref 0.0–149.0)
VLDL: 46.4 mg/dL — ABNORMAL HIGH (ref 0.0–40.0)

## 2019-01-19 LAB — LDL CHOLESTEROL, DIRECT: Direct LDL: 90 mg/dL

## 2019-01-19 LAB — FOLATE: Folate: 4.5 ng/mL — ABNORMAL LOW (ref >5.9)

## 2019-01-19 LAB — IBC PANEL
Iron: 55 ug/dL (ref 42–145)
Saturation Ratios: 17.9 % — ABNORMAL LOW (ref 20.0–50.0)
Transferrin: 220 mg/dL (ref 212.0–360.0)

## 2019-01-19 LAB — FERRITIN: Ferritin: 213.8 ng/mL (ref 10.0–291.0)

## 2019-01-19 LAB — VITAMIN D 25 HYDROXY (VIT D DEFICIENCY, FRACTURES): VITD: 15.1 ng/mL — ABNORMAL LOW (ref 30.00–100.00)

## 2019-01-19 LAB — HEMOGLOBIN A1C: Hgb A1c MFr Bld: 5.7 % (ref 4.6–6.5)

## 2019-01-19 MED ORDER — ESCITALOPRAM OXALATE 20 MG PO TABS: 20.0000 mg | ORAL_TABLET | Freq: Every day | ORAL | 1 refills | Status: DC

## 2019-01-19 MED ORDER — ATENOLOL 25 MG PO TABS: ORAL_TABLET | ORAL | 1 refills | Status: DC

## 2019-01-19 MED ORDER — FOLIC ACID 1 MG PO TABS: 1.0000 mg | ORAL_TABLET | Freq: Every day | ORAL | 1 refills | Status: DC

## 2019-01-19 MED ORDER — CHOLECALCIFEROL 1.25 MG (50000 UT) PO CAPS: 50000.0000 [IU] | ORAL_CAPSULE | ORAL | 0 refills | Status: DC

## 2019-01-19 MED ORDER — NITROFURANTOIN MONOHYD MACRO 100 MG PO CAPS: 100.0000 mg | ORAL_CAPSULE | Freq: Two times a day (BID) | ORAL | 0 refills | Status: DC

## 2019-01-19 MED ORDER — OLMESARTAN-AMLODIPINE-HCTZ 20-5-12.5 MG PO TABS: 0.5000 | ORAL_TABLET | Freq: Every day | ORAL | 0 refills | Status: DC

## 2019-01-19 MED ORDER — LORAZEPAM 1 MG PO TABS: 1.0000 mg | ORAL_TABLET | Freq: Every day | ORAL | 0 refills | Status: DC

## 2019-01-19 MED ORDER — OMEPRAZOLE 20 MG PO CPDR: 20.0000 mg | DELAYED_RELEASE_CAPSULE | Freq: Two times a day (BID) | ORAL | 1 refills | Status: DC

## 2019-01-19 MED ORDER — DENOSUMAB 60 MG/ML ~~LOC~~ SOSY
60.0000 mg | PREFILLED_SYRINGE | Freq: Once | SUBCUTANEOUS | Status: AC
  Administered 2019-01-19: 60 mg via SUBCUTANEOUS

## 2019-01-19 MED ORDER — MONTELUKAST SODIUM 10 MG PO TABS: ORAL_TABLET | ORAL | 1 refills | Status: DC

## 2019-01-19 MED ORDER — DIPHENOXYLATE-ATROPINE 2.5-0.025 MG PO TABS: 1.0000 | ORAL_TABLET | Freq: Four times a day (QID) | ORAL | 1 refills | Status: DC | PRN

## 2019-01-19 MED ORDER — ICOSAPENT ETHYL 1 G PO CAPS: 2.0000 | ORAL_CAPSULE | Freq: Two times a day (BID) | ORAL | 1 refills | Status: DC

## 2019-01-19 MED ORDER — VITAMIN B-1 100 MG PO TABS: 100.0000 mg | ORAL_TABLET | Freq: Every day | ORAL | 1 refills | Status: DC

## 2019-01-19 NOTE — Patient Instructions (Signed)

## 2019-01-19 NOTE — Progress Notes (Signed)
Subjective:  Patient ID: [name], female    DOB: September 05, 1946  Age: 74 y.o. MRN: [id number]  CC: Hypertension; Hyperlipidemia; and Anemia   HPI [name] presents for f/up - She complains of a 3-week history of dysuria, urgency, and hesitancy.  Her daughter tells me she is not taking any of her vitamin supplements.  She continues to complain of intermittent diarrhea.  Outpatient Medications Prior to Visit  Medication Sig Dispense Refill  . cilostazol (PLETAL) 100 MG tablet TAKE 1 TABLET BY MOUTH TWICE A DAY (Patient taking differently: Take 100 mg by mouth daily. ) 180 tablet 1  . ferrous sulfate 325 (65 FE) MG tablet Take 1 tablet (325 mg total) by mouth daily with breakfast. 180 tablet 1  . fluticasone (FLONASE) 50 MCG/ACT nasal spray Place 2 sprays into both nostrils daily. 16 g 6  . Glycopyrrolate (LONHALA MAGNAIR REFILL KIT) 25 MCG/ML SOLN Inhale 1 Act into the lungs 2 (two) times daily. 60 mL 11  . rosuvastatin (CRESTOR) 20 MG tablet TAKE ONE TABLET (20 MG TOTAL) BY MOUTH AT BEDTIME. (Patient taking differently: Take 20 mg by mouth daily. ) 90 tablet 1  . atenolol (TENORMIN) 25 MG tablet TAKE ONE TABLET (25 MG TOTAL) BY MOUTH 2 (TWO) TIMES DAILY. 180 tablet 1  . buPROPion (WELLBUTRIN XL) 150 MG 24 hr tablet Take 150 mg daily by mouth.    . cetirizine (ZYRTEC) 10 MG tablet TAKE 1 TABLET BY MOUTH EVERY DAY 30 tablet 5  . doxycycline (VIBRA-TABS) 100 MG tablet Take 1 tablet (100 mg total) by mouth 2 (two) times daily. 14 tablet 0  . escitalopram (LEXAPRO) 20 MG tablet TAKE ONE TABLET (20 MG TOTAL) BY MOUTH DAILY. 90 tablet 1  . folic acid (FOLVITE) 1 MG tablet Take 1 tablet (1 mg total) by mouth daily. 90 tablet 1  . Ibuprofen-Famotidine (DUEXIS) 800-26.6 MG TABS Take 1 tablet by mouth 3 (three) times daily as needed (pain).    Vanessa Kick Ethyl (VASCEPA) 1 g CAPS Take 2 capsules by mouth 2 (two) times daily. 360 capsule 1  . loperamide (IMODIUM A-D) 2 MG tablet Take 1  tablet (2 mg total) by mouth 4 (four) times daily as needed for diarrhea or loose stools. 30 tablet 0  . LORazepam (ATIVAN) 1 MG tablet TAKE 1 TABLET BY MOUTH EVERY DAY (Patient taking differently: Take 1 mg by mouth daily. ) 180 tablet 0  . montelukast (SINGULAIR) 10 MG tablet TAKE 1 TABLET BY MOUTH EVERYDAY AT BEDTIME 90 tablet 1  . Olmesartan-amLODIPine-HCTZ 20-5-12.5 MG TABS Take 0.5 tablets by mouth daily. 90 tablet 0  . omeprazole (PRILOSEC) 20 MG capsule TAKE ONE CAPSULE BY MOUTH 2 TIMES DAILY. 180 capsule 1  . oxyCODONE (ROXICODONE) 5 MG/5ML solution Take 5 mLs (5 mg total) by mouth 3 (three) times daily as needed for severe pain. 75 mL 0  . predniSONE (DELTASONE) 20 MG tablet Take 2 tablets (40 mg total) by mouth daily with breakfast. 14 tablet 0  . thiamine (VITAMIN B-1) 100 MG tablet Take 1 tablet (100 mg total) by mouth daily. 90 tablet 1  . Glycopyrrolate (LONHALA MAGNAIR STARTER KIT) 25 MCG/ML SOLN Inhale 1 Act into the lungs 2 (two) times daily. (Patient not taking: Reported on 10/21/2018) 60 mL 1  . LORazepam (ATIVAN) 1 MG tablet TAKE 1 TABLET BY MOUTH EVERY DAY (Patient not taking: Reported on 10/21/2018) 180 tablet 0   No facility-administered medications prior to visit.  ROS Review of Systems  Constitutional: Negative for appetite change, fatigue and unexpected weight change.  HENT: Negative.   Eyes: Negative for visual disturbance.  Respiratory: Negative for cough, chest tightness, shortness of breath and wheezing.   Cardiovascular: Negative for chest pain, palpitations and leg swelling.  Gastrointestinal: Positive for diarrhea. Negative for abdominal pain, blood in stool, constipation, nausea and vomiting.  Endocrine: Negative for cold intolerance and heat intolerance.  Genitourinary: Positive for dysuria, frequency and urgency. Negative for decreased urine volume, vaginal discharge and vaginal pain.  Musculoskeletal: Negative.  Negative for arthralgias and myalgias.    Skin: Negative for color change, pallor and rash.  Neurological: Negative.  Negative for dizziness, weakness, light-headedness and headaches.  Hematological: Negative for adenopathy. Does not bruise/bleed easily.  Psychiatric/Behavioral: Positive for dysphoric mood. Negative for sleep disturbance and suicidal ideas. The patient is nervous/anxious.     Objective:  BP 130/60 (BP Location: Left Arm, Patient Position: Sitting, Cuff Size: Normal)   Pulse 66   Temp 98.6 F (37 C) (Oral)   Ht _0  (1.575 m)   Wt 128 lb (58.1 kg)   SpO2 96%   BMI 23.41 kg/m   BP Readings from Last 3 Encounters:  01/19/19 130/60  11/03/18 110/60  10/21/18 (!) 159/78    Wt Readings from Last 3 Encounters:  01/19/19 128 lb (58.1 kg)  11/03/18 123 lb (55.8 kg)  12/28/17 135 lb (61.2 kg)    Physical Exam Vitals signs reviewed.  HENT:     Nose: Nose normal. No congestion or rhinorrhea.     Mouth/Throat:     Mouth: Mucous membranes are moist. Mucous membranes are pale.     Pharynx: Oropharynx is clear. No oropharyngeal exudate or posterior oropharyngeal erythema.  Eyes:     General: No scleral icterus.    Conjunctiva/sclera: Conjunctivae normal.  Neck:     Musculoskeletal: Normal range of motion and neck supple. No neck rigidity or muscular tenderness.     Thyroid: No thyroid mass or thyromegaly.     Trachea: Tracheostomy present.  Cardiovascular:     Rate and Rhythm: Normal rate and regular rhythm.     Pulses: Normal pulses.     Heart sounds: No murmur. No friction rub. No gallop.   Pulmonary:     Effort: Pulmonary effort is normal. No respiratory distress.     Breath sounds: No stridor. No wheezing, rhonchi or rales.  Abdominal:     General: Abdomen is flat.     Palpations: There is no hepatomegaly, splenomegaly or mass.     Tenderness: There is no abdominal tenderness. There is no guarding.  Musculoskeletal: Normal range of motion.        General: No swelling.     Right lower leg: No  edema.     Left lower leg: No edema.  Lymphadenopathy:     Cervical: No cervical adenopathy.  Skin:    General: Skin is warm and dry.     Coloration: Skin is pale.     Findings: No rash.  Neurological:     General: No focal deficit present.     Mental Status: She is alert and oriented to person, place, and time. Mental status is at baseline.  Psychiatric:        Mood and Affect: Mood normal.        Behavior: Behavior normal.        Thought Content: Thought content normal.        Judgment: Judgment normal.  Lab Results  Component Value Date   WBC 7.0 01/19/2019   HGB 10.7 (L) 01/19/2019   HCT 32.4 (L) 01/19/2019   PLT 270.0 01/19/2019   GLUCOSE 92 01/19/2019   CHOL 190 01/19/2019   TRIG 232.0 (H) 01/19/2019   HDL 46.70 01/19/2019   LDLDIRECT 90.0 01/19/2019   ALT 11 01/19/2019   AST 14 01/19/2019   NA 141 01/19/2019   K 3.9 01/19/2019   CL 104 01/19/2019   CREATININE 1.60 (H) 01/19/2019   BUN 28 (H) 01/19/2019   CO2 26 01/19/2019   TSH 6.61 (H) 01/19/2019   INR 1.17 08/17/2012   HGBA1C 5.7 01/19/2019    Dg Tibia/fibula Left  Result Date: 10/21/2018 CLINICAL DATA:  Leg pain secondary to a fall 5 days ago. EXAM: LEFT TIBIA AND FIBULA - 2 VIEW COMPARISON:  None. FINDINGS: The tibia and fibula are intact. No appreciable effusions. No dislocation. IMPRESSION: Normal left lower leg.  Loops Electronically Signed   By: Lorriane Shire M.D.   On: 10/21/2018 16:42   Dg Ankle Complete Left  Result Date: 10/21/2018 CLINICAL DATA:  Pain secondary to a fall 5 days ago. EXAM: LEFT ANKLE COMPLETE - 3+ VIEW COMPARISON:  None. FINDINGS: There is no evidence of fracture, dislocation, or joint effusion. There is no evidence of arthropathy or other focal bone abnormality. Soft tissues are unremarkable. IMPRESSION: Negative. Electronically Signed   By: Lorriane Shire M.D.   On: 10/21/2018 16:44   Dg Knee Complete 4 Views Left  Result Date: 10/21/2018 CLINICAL DATA:  Progressive leg  pain after a fall five days ago. Left knee pain. Limited range of motion. EXAM: LEFT KNEE - COMPLETE 4+ VIEW COMPARISON:  None. FINDINGS: There is a slightly comminuted a primarily transverse fracture of the patella with only minimal distraction, 2 mm. The distal femur and the proximal tibia and fibula are normal. No joint effusion. IMPRESSION: Patellar fracture with only minimal distraction. Electronically Signed   By: Lorriane Shire M.D.   On: 10/21/2018 16:41   Dg Foot Complete Left  Result Date: 10/21/2018 CLINICAL DATA:  Pain secondary to a fall 5 days ago. EXAM: LEFT FOOT - COMPLETE 3+ VIEW COMPARISON:  None. FINDINGS: The patient is a hallux valgus deformity with bunion formation on the head of the first metatarsal. No fractures or dislocations. IMPRESSION: No acute abnormality.  Hallux valgus. Electronically Signed   By: Lorriane Shire M.D.   On: 10/21/2018 16:43   Dg Femur Min 2 Views Left  Result Date: 10/21/2018 CLINICAL DATA:  Worsening left lower extremity pain post fall. EXAM: LEFT FEMUR 2 VIEWS COMPARISON:  None. FINDINGS: There is no evidence of fracture or other focal bone lesions. Soft tissues are unremarkable. IMPRESSION: Negative. Electronically Signed   By: Fidela Salisbury M.D.   On: 10/21/2018 16:40        Clarity, UA  clear    Glucose, UA Negative Negative    Bilirubin, UA  Negative    Ketones, UA  Negative    Spec Grav, UA 1.010 - 1.025 1.010    Blood, UA  Negative    pH, UA 5.0 - 8.0 7.5    Protein, UA Negative PositiveAbnormal     Comment: 100 mg/dL  Urobilinogen, UA 0.2 or 1.0 E.U./dL 0.2    Nitrite, UA  POSITIVE    Leukocytes, UA Negative Small (1+)       Assessment & Plan:   Carmin was seen today for hypertension, hyperlipidemia and anemia.  Diagnoses  and all orders for this visit:  Essential hypertension- Her blood pressure is adequately well controlled.  Will continue the current regimen. -     CBC with Differential/Platelet; Future -      Comprehensive metabolic panel; Future -     atenolol (TENORMIN) 25 MG tablet; TAKE ONE TABLET (25 MG TOTAL) BY MOUTH 2 (TWO) TIMES DAILY. -     Olmesartan-amLODIPine-HCTZ 20-5-12.5 MG TABS; Take 0.5 tablets by mouth daily.  Anemia, unspecified type- She continues to be anemic and has several vitamin deficiencies. -     CBC with Differential/Platelet; Future -     Vitamin B1; Future -     Ferritin; Future -     Vitamin B12; Future -     Reticulocytes; Future -     IBC panel; Future  Dietary folate deficiency anemia- She is anemic and her folate level is low.  I have asked her to restart the folate supplement. -     CBC with Differential/Platelet; Future -     Folate; Future -     Vitamin B12; Future -     Gliadin antibodies, serum; Future -     folic acid (FOLVITE) 1 MG tablet; Take 1 tablet (1 mg total) by mouth daily.  TSH elevation- Her TSH remains mildly elevated but it is stable over time and clinically she appears euthyroid.  I don't think a thyroid supplement is indicated.  Thiamine deficiency- Her thiamine level is low.  I have asked her to restart a site thiamine supplement. -     CBC with Differential/Platelet; Future -     Vitamin B1; Future -     thiamine (VITAMIN B-1) 100 MG tablet; Take 1 tablet (100 mg total) by mouth daily.  Pure hyperglyceridemia- Improvement noted with Vascepa. -     Lipid panel; Future -     Icosapent Ethyl (VASCEPA) 1 g CAPS; Take 2 capsules (2 g total) by mouth 2 (two) times daily.  Prediabetes-her A1c is at 5.7%.  Medical therapy is not indicated. -     Comprehensive metabolic panel; Future -     Hemoglobin A1c; Future  Hyperlipidemia LDL goal <130-she has achieved her LDL goal and is doing well on the statin. -     TSH; Future -     Lipid panel; Future  Osteopenia, unspecified location -     denosumab (PROLIA) injection 60 mg  Need for influenza vaccination -     Flu vaccine HIGH DOSE PF (Fluzone High dose)  Screen for colon cancer -      Ambulatory referral to Gastroenterology  Diarrhea, unspecified type-she is not getting adequate symptom relief from the diarrhea with Imodium so offered Lomotil.  I will screen her for celiac disease.  I have also asked her to see GI to see if she needs to undergo a lower endoscopy. -     Ambulatory referral to Gastroenterology -     Gliadin antibodies, serum; Future -     Tissue transglutaminase, IgA; Future -     Reticulin Antibody, IgA w reflex titer; Future -     diphenoxylate-atropine (LOMOTIL) 2.5-0.025 MG tablet; Take 1 tablet by mouth 4 (four) times daily as needed for diarrhea or loose stools.  Breast cancer screening by mammogram -     MM DIGITAL SCREENING BILATERAL; Future  Acute cystitis with hematuria- Her urine culture is positive for Proteus mirabilis that is resistant to nitrofurantoin.  It is sensitive to Bactrim DS so I have  asked her to take a 5-day course of this. -     Discontinue: nitrofurantoin, macrocrystal-monohydrate, (MACROBID) 100 MG capsule; Take 1 capsule (100 mg total) by mouth 2 (two) times daily for 5 days.  Vitamin D deficiency disease -     VITAMIN D 25 Hydroxy (Vit-D Deficiency, Fractures); Future -     Cholecalciferol 1.25 MG (50000 UT) capsule; Take 1 capsule (50,000 Units total) by mouth once a week.  Dysuria -     CULTURE, URINE COMPREHENSIVE; Future -     POCT Urinalysis Dipstick (Automated)  Need for pneumococcal vaccination -     Pneumococcal polysaccharide vaccine 23-valent greater than or equal to 2yo subcutaneous/IM  GAD (generalized anxiety disorder) -     escitalopram (LEXAPRO) 20 MG tablet; Take 1 tablet (20 mg total) by mouth daily. -     LORazepam (ATIVAN) 1 MG tablet; Take 1 tablet (1 mg total) by mouth daily.  Gastroesophageal reflux disease with esophagitis -     omeprazole (PRILOSEC) 20 MG capsule; Take 1 capsule (20 mg total) by mouth 2 (two) times daily before a meal.  Seasonal allergic rhinitis due to pollen -      montelukast (SINGULAIR) 10 MG tablet; TAKE 1 TABLET BY MOUTH EVERYDAY AT BEDTIME  CRI (chronic renal insufficiency), stage 4 (severe) (HCC) -     Ambulatory referral to Nephrology   I have discontinued Seraphim D. Dimond's buPROPion, cetirizine, LORazepam, Ibuprofen-Famotidine, doxycycline, predniSONE, loperamide, and oxyCODONE. I have also changed her escitalopram, Icosapent Ethyl, omeprazole, and LORazepam. Additionally, I am having her start on diphenoxylate-atropine and Cholecalciferol. Lastly, I am having her maintain her Glycopyrrolate, ferrous sulfate, rosuvastatin, cilostazol, fluticasone, atenolol, Olmesartan-amLODIPine-HCTZ, thiamine, montelukast, and folic acid. We administered denosumab.  Meds ordered this encounter  Medications  . denosumab (PROLIA) injection 60 mg  . diphenoxylate-atropine (LOMOTIL) 2.5-0.025 MG tablet    Sig: Take 1 tablet by mouth 4 (four) times daily as needed for diarrhea or loose stools.    Dispense:  75 tablet    Refill:  1  . DISCONTD: nitrofurantoin, macrocrystal-monohydrate, (MACROBID) 100 MG capsule    Sig: Take 1 capsule (100 mg total) by mouth 2 (two) times daily for 5 days.    Dispense:  10 capsule    Refill:  0  . escitalopram (LEXAPRO) 20 MG tablet    Sig: Take 1 tablet (20 mg total) by mouth daily.    Dispense:  90 tablet    Refill:  1  . atenolol (TENORMIN) 25 MG tablet    Sig: TAKE ONE TABLET (25 MG TOTAL) BY MOUTH 2 (TWO) TIMES DAILY.    Dispense:  180 tablet    Refill:  1  . Icosapent Ethyl (VASCEPA) 1 g CAPS    Sig: Take 2 capsules (2 g total) by mouth 2 (two) times daily.    Dispense:  360 capsule    Refill:  1  . Olmesartan-amLODIPine-HCTZ 20-5-12.5 MG TABS    Sig: Take 0.5 tablets by mouth daily.    Dispense:  90 tablet    Refill:  0  . omeprazole (PRILOSEC) 20 MG capsule    Sig: Take 1 capsule (20 mg total) by mouth 2 (two) times daily before a meal.    Dispense:  180 capsule    Refill:  1  . thiamine (VITAMIN B-1) 100 MG  tablet    Sig: Take 1 tablet (100 mg total) by mouth daily.    Dispense:  90 tablet    Refill:  1  .  LORazepam (ATIVAN) 1 MG tablet    Sig: Take 1 tablet (1 mg total) by mouth daily.    Dispense:  180 tablet    Refill:  0    Not to exceed 5 additional fills before 09/13/2018.  . montelukast (SINGULAIR) 10 MG tablet    Sig: TAKE 1 TABLET BY MOUTH EVERYDAY AT BEDTIME    Dispense:  90 tablet    Refill:  1  . folic acid (FOLVITE) 1 MG tablet    Sig: Take 1 tablet (1 mg total) by mouth daily.    Dispense:  90 tablet    Refill:  1  . Cholecalciferol 1.25 MG (50000 UT) capsule    Sig: Take 1 capsule (50,000 Units total) by mouth once a week.    Dispense:  12 capsule    Refill:  0     Follow-up: Return in about 3 weeks (around 02/09/2019).  Scarlette Calico, MD

## 2019-01-22 ENCOUNTER — Other Ambulatory Visit: Payer: Self-pay | Admitting: Internal Medicine

## 2019-01-22 DIAGNOSIS — N3001 Acute cystitis with hematuria: Secondary | ICD-10-CM

## 2019-01-22 MED ORDER — SULFAMETHOXAZOLE-TRIMETHOPRIM 800-160 MG PO TABS: 1.0000 | ORAL_TABLET | Freq: Two times a day (BID) | ORAL | 0 refills | Status: AC

## 2019-01-23 ENCOUNTER — Ambulatory Visit (INDEPENDENT_AMBULATORY_CARE_PROVIDER_SITE_OTHER)
Admission: RE | Admit: 2019-01-23 | Discharge: 2019-01-23 | Disposition: A | Discharge status: Home / Self Care | Payer: Medicare Other | Source: Ambulatory Visit | Attending: Internal Medicine | Admitting: Internal Medicine

## 2019-01-23 DIAGNOSIS — M810 Age-related osteoporosis without current pathological fracture: Secondary | ICD-10-CM | POA: Diagnosis not present

## 2019-01-23 DIAGNOSIS — S82002S Unspecified fracture of left patella, sequela: Secondary | ICD-10-CM

## 2019-01-23 LAB — RETICULOCYTES
ABS RETIC: 61600 {cells}/uL (ref 20000–8000)
Retic Ct Pct: 1.6 %

## 2019-01-23 LAB — EXTRA URINE SPECIMEN

## 2019-01-23 LAB — CULTURE, URINE COMPREHENSIVE
MICRO NUMBER:: 161017
SPECIMEN QUALITY:: ADEQUATE

## 2019-01-23 LAB — GLIADIN ANTIBODIES, SERUM
Gliadin IgA: 5 Units
Gliadin IgG: 2 Units

## 2019-01-23 LAB — VITAMIN B1: Vitamin B1 (Thiamine): 11 nmol/L (ref 8–30)

## 2019-01-23 LAB — RETICULIN ANTIBODIES, IGA W TITER: Reticulin IGA Screen: NEGATIVE

## 2019-01-23 LAB — TISSUE TRANSGLUTAMINASE, IGA: (tTG) Ab, IgA: 1 U/mL

## 2019-01-23 NOTE — Addendum Note (Signed)
Addended by: Karle Barr on: 01/23/2019 10:54 AM   Modules accepted: Orders

## 2019-01-25 LAB — HM DEXA SCAN

## 2019-02-13 ENCOUNTER — Other Ambulatory Visit: Payer: Self-pay

## 2019-02-13 ENCOUNTER — Emergency Department (HOSPITAL_COMMUNITY)
Admission: EM | Admit: 2019-02-13 | Discharge: 2019-02-14 | Disposition: A | Discharge status: Home / Self Care | Payer: Medicare Other | Attending: Emergency Medicine | Admitting: Emergency Medicine

## 2019-02-13 ENCOUNTER — Encounter (HOSPITAL_COMMUNITY): Payer: Self-pay | Admitting: Emergency Medicine

## 2019-02-13 DIAGNOSIS — D649 Anemia, unspecified: Secondary | ICD-10-CM | POA: Insufficient documentation

## 2019-02-13 DIAGNOSIS — N184 Chronic kidney disease, stage 4 (severe): Secondary | ICD-10-CM | POA: Diagnosis not present

## 2019-02-13 DIAGNOSIS — Z930 Tracheostomy status: Secondary | ICD-10-CM | POA: Diagnosis not present

## 2019-02-13 DIAGNOSIS — R112 Nausea with vomiting, unspecified: Secondary | ICD-10-CM | POA: Insufficient documentation

## 2019-02-13 DIAGNOSIS — R1032 Left lower quadrant pain: Secondary | ICD-10-CM | POA: Diagnosis not present

## 2019-02-13 DIAGNOSIS — Z8521 Personal history of malignant neoplasm of larynx: Secondary | ICD-10-CM | POA: Diagnosis not present

## 2019-02-13 DIAGNOSIS — K921 Melena: Secondary | ICD-10-CM | POA: Insufficient documentation

## 2019-02-13 DIAGNOSIS — J449 Chronic obstructive pulmonary disease, unspecified: Secondary | ICD-10-CM | POA: Diagnosis not present

## 2019-02-13 DIAGNOSIS — Z79899 Other long term (current) drug therapy: Secondary | ICD-10-CM | POA: Diagnosis not present

## 2019-02-13 DIAGNOSIS — I129 Hypertensive chronic kidney disease with stage 1 through stage 4 chronic kidney disease, or unspecified chronic kidney disease: Secondary | ICD-10-CM | POA: Insufficient documentation

## 2019-02-13 DIAGNOSIS — R55 Syncope and collapse: Secondary | ICD-10-CM | POA: Insufficient documentation

## 2019-02-13 DIAGNOSIS — N289 Disorder of kidney and ureter, unspecified: Secondary | ICD-10-CM

## 2019-02-13 DIAGNOSIS — Z87891 Personal history of nicotine dependence: Secondary | ICD-10-CM | POA: Diagnosis not present

## 2019-02-13 DIAGNOSIS — Z8542 Personal history of malignant neoplasm of other parts of uterus: Secondary | ICD-10-CM | POA: Insufficient documentation

## 2019-02-13 DIAGNOSIS — R197 Diarrhea, unspecified: Secondary | ICD-10-CM | POA: Insufficient documentation

## 2019-02-13 LAB — POC OCCULT BLOOD, ED: Fecal Occult Bld: NEGATIVE

## 2019-02-13 MED ORDER — ONDANSETRON HCL 4 MG/2ML IJ SOLN
4.0000 mg | Freq: Once | INTRAMUSCULAR | Status: AC
  Administered 2019-02-13: 4 mg via INTRAVENOUS
  Filled 2019-02-13: qty 2

## 2019-02-13 MED ORDER — SODIUM CHLORIDE 0.9 % IV BOLUS
1000.0000 mL | Freq: Once | INTRAVENOUS | Status: AC
  Administered 2019-02-13: 1000 mL via INTRAVENOUS

## 2019-02-13 MED ORDER — LOPERAMIDE HCL 2 MG PO CAPS
4.0000 mg | ORAL_CAPSULE | Freq: Once | ORAL | Status: AC
  Administered 2019-02-13: 4 mg via ORAL
  Filled 2019-02-13: qty 2

## 2019-02-13 NOTE — ED Provider Notes (Signed)
[facility] DEPT Provider Note   CSN: [id number] Arrival date & time: 02/13/19  2241    History   Chief Complaint Chief Complaint  Patient presents with  . Diarrhea  . Melena    HPI [name] is a 73 y.o. female.   The history is provided by the patient. A language interpreter was used.  Diarrhea  She has history of hypertension, hyperlipidemia, stroke, GERD, throat cancer, tracheostomy and comes in because of vomiting and diarrhea today.  She has had numerous episodes of emesis and diarrhea.  Her daughter did give her Pepto-Bismol, and she has passed out black stools.  She is feeling somewhat lightheaded and passed out at triage.  There was no syncope at home.  She is complaining of abdominal pain.  There is been no fever or chills.  There have been no known sick contacts.  Past Medical History:  Diagnosis Date  . Anginal pain ([place])    ?  [name] Anxiety    takes Ativan prn  . Blood transfusion without reported diagnosis 09/15/12   2 units Prbc's  . Broken ribs   . Chronic back pain   . Constipation    related to pain meds  . COPD (chronic obstructive pulmonary disease) ([place]) 08/10/2012   denies  . Depression   . Gastrostomy in place [facility])    removed  . GERD (gastroesophageal reflux disease)    takes Zantac daily  . Headache(784.0)   . Hiatal hernia 08/10/2012  . History of radiation therapy 10/17/12-11/25/12   supraglottic larynx,high risk neck tumor bed 5880 cGy/28 sessions, high risk lymph node tumor bed 5600 cGy/20 sessions, mod risk lymph node tumor bed 5040 cGy/20 sessions  . Hypercholesteremia    takes Pravastatin daily  . Hypertension    takes Tribenzor and Atenolol daily  . Insomnia    takes Amitriptyline daily  . Nausea    takes Zofran prn  . Pneumonia   . 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 Shortness of breath dyspnea   . Stroke [facility]) 2011   denies. no residual  . Uterine cancer [facility])      Patient Active Problem List   Diagnosis Date Noted  . Screen for colon cancer 01/19/2019  . Osteopenia 01/19/2019  . Need for influenza vaccination 01/19/2019  . Breast cancer screening by mammogram 01/19/2019  . Acute cystitis with hematuria 01/19/2019  . CRI (chronic renal insufficiency), stage 4 (severe) ([place]) 01/19/2019  . Diarrhea 11/04/2018  . Thiamine deficiency 01/02/2018  . TSH elevation 12/29/2017  . Pure hyperglyceridemia 12/29/2017  . Prediabetes 12/28/2017  . Hyperlipidemia LDL goal <130 12/28/2017  . COPD (chronic obstructive pulmonary disease) with chronic bronchitis ([place]) 12/28/2017  . Dietary folate deficiency anemia 12/28/2017  . Moderate episode of recurrent major depressive disorder ([place]) 10/22/2017  . Dysphagia, pharyngoesophageal phase 05/22/2014  . History of radiation therapy   . Chronic pain syndrome 09/03/2012  . Absolute anemia 08/12/2012  . Tobacco abuse 08/10/2012  . COPD (chronic obstructive pulmonary disease) ([place]) 08/10/2012  . 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 08/08/2012    Class: Chronic  . Hypertension   . GAD (generalized anxiety disorder)     Past Surgical History:  Procedure Laterality Date  . ABDOMINAL SURGERY     r/t uterine carcinoma  . APPENDECTOMY    . DIRECT LARYNGOSCOPY N/A 05/22/2014   Procedure: DIRECT LARYNGOSCOPY WITH ESOPHAGEAL DILATION;  Surgeon: Jerrell Belfast, MD;  Location: [facility];  Service: ENT;  Laterality: N/A;  . ESOPHAGOSCOPY WITH DILITATION N/A 05/29/2015   Procedure: ESOPHAGOSCOPY WITH ESOPHAGEAL DILITATION;  Surgeon: Jerrell Belfast, MD;  Location: [facility] OR;  Service: ENT;  Laterality: N/A;  . Gastrostomy Tube removed   2013  . GASTROSTOMY W/ FEEDING TUBE  12  . GASTROTOMY    . HERNIA REPAIR     child  . LARYNGETOMY  08/31/2012   Procedure: LARYNGECTOMY;  Surgeon: Jerrell Belfast, MD;  Location: [facility];  Service: ENT;  Laterality: N/A;  . LARYNGOSCOPY  08/10/2012   Procedure: LARYNGOSCOPY;   Surgeon: Jerrell Belfast, MD;  Location: [facility];  Service: ENT;  Laterality: N/A;  with biopsy  . RADICAL NECK DISSECTION  08/31/2012   Procedure: RADICAL NECK DISSECTION;  Surgeon: Jerrell Belfast, MD;  Location: [facility];  Service: ENT;  Laterality: Bilateral;  . TRACHEAL ESOPHOGEAL PUNCTURE WITH REPAIR STOMA N/A 09/08/2013   Procedure: TRACHEAL ESOPHOGEAL PUNCTURE WITH PLACEMENT OF  PROVOX PROSTHESIS ;  Surgeon: Jerrell Belfast, MD;  Location: [place];  Service: ENT;  Laterality: N/A;  . TRACHEOSTOMY TUBE PLACEMENT  08/10/2012   Procedure: TRACHEOSTOMY;  Surgeon: Jerrell Belfast, MD;  Location: [facility];  Service: ENT;  Laterality: N/A;     OB History   No obstetric history on file.      Home Medications    Prior to Admission medications   Medication Sig Start Date End Date Taking? Authorizing Provider  atenolol (TENORMIN) 25 MG tablet TAKE ONE TABLET (25 MG TOTAL) BY MOUTH 2 (TWO) TIMES DAILY. 01/19/19   Janith Lima, MD  Cholecalciferol 1.25 MG (50000 UT) capsule Take 1 capsule (50,000 Units total) by mouth once a week. 01/19/19   Janith Lima, MD  cilostazol (PLETAL) 100 MG tablet TAKE 1 TABLET BY MOUTH TWICE A DAY Patient taking differently: Take 100 mg by mouth daily.  03/20/18   Janith Lima, MD  diphenoxylate-atropine (LOMOTIL) 2.5-0.025 MG tablet Take 1 tablet by mouth 4 (four) times daily as needed for diarrhea or loose stools. 01/19/19   Janith Lima, MD  escitalopram (LEXAPRO) 20 MG tablet Take 1 tablet (20 mg total) by mouth daily. 01/19/19   Janith Lima, MD  ferrous sulfate 325 (65 FE) MG tablet Take 1 tablet (325 mg total) by mouth daily with breakfast. 12/29/17   Janith Lima, MD  fluticasone (FLONASE) 50 MCG/ACT nasal spray Place 2 sprays into both nostrils daily. 11/03/18   Hoyt Koch, MD  folic acid (FOLVITE) 1 MG tablet Take 1 tablet (1 mg total) by mouth daily. 01/19/19   Janith Lima, MD  Glycopyrrolate [facility] MAGNAIR REFILL KIT) 25 MCG/ML SOLN Inhale 1  Act into the lungs 2 (two) times daily. 12/28/17   Janith Lima, MD  Icosapent Ethyl (VASCEPA) 1 g CAPS Take 2 capsules (2 g total) by mouth 2 (two) times daily. 01/19/19   Janith Lima, MD  LORazepam (ATIVAN) 1 MG tablet Take 1 tablet (1 mg total) by mouth daily. 01/19/19   Janith Lima, MD  montelukast (SINGULAIR) 10 MG tablet TAKE 1 TABLET BY MOUTH EVERYDAY AT BEDTIME 01/19/19   Janith Lima, MD  Olmesartan-amLODIPine-HCTZ 20-5-12.5 MG TABS Take 0.5 tablets by mouth daily. 01/19/19   Janith Lima, MD  omeprazole (PRILOSEC) 20 MG capsule Take 1 capsule (20 mg total) by mouth 2 (two) times daily before a meal. 01/19/19   Janith Lima, MD  rosuvastatin (CRESTOR) 20 MG tablet TAKE ONE TABLET (20  MG TOTAL) BY MOUTH AT BEDTIME. Patient taking differently: Take 20 mg by mouth daily.  03/17/18   Janith Lima, MD  thiamine (VITAMIN B-1) 100 MG tablet Take 1 tablet (100 mg total) by mouth daily. 01/19/19   Janith Lima, MD    Family History History reviewed. No pertinent family history.  Social History Social History   Tobacco Use  . Smoking status: Former Smoker    Packs/day: 0.25    Years: 50.00    Pack years: 12.50    Types: Cigarettes    Last attempt to quit: 05/27/2013    Years since quitting: 5.7  . Smokeless tobacco: Never Used  Substance Use Topics  . Alcohol use: No  . Drug use: No     Allergies   Bacitracin   Review of Systems Review of Systems  Gastrointestinal: Positive for diarrhea.  All other systems reviewed and are negative.    Physical Exam Updated Vital Signs BP (!) 142/60 (BP Location: Left Arm)   Pulse 88   Temp 99.9 F (37.7 C) (Oral)   Resp 16   Ht '5\' 4"'$  (1.626 m)   Wt 58.5 kg   SpO2 99%   BMI 22.14 kg/m   Physical Exam Vitals signs and nursing note reviewed. Exam conducted with a chaperone present.    73 year old female, resting comfortably and in no acute distress. Vital signs are significant for borderline elevated blood pressure.  Oxygen saturation is 99%, which is normal. Head is normocephalic and atraumatic. PERRLA, EOMI. Oropharynx is clear. Neck is nontender and supple without adenopathy or JVD.  Tracheostomy in place. Back is nontender and there is no CVA tenderness. Lungs are clear without rales, wheezes, or rhonchi. Chest is nontender. Heart has regular rate and rhythm without murmur. Abdomen is soft, flat, with mild to moderate left lower quadrant tenderness.  There is no rebound or guarding.  There are no masses or hepatosplenomegaly and peristalsis is hypoactive. Rectal: Slightly decreased sphincter tone.  Small amount of pinkish stool which is Hemoccult negative. Extremities have no cyanosis or edema, full range of motion is present. Skin is warm and dry without rash. Neurologic: Mental status is normal, cranial nerves are intact, there are no motor or sensory deficits.  ED Treatments / Results  Labs (all labs ordered are listed, but only abnormal results are displayed) Labs Reviewed  COMPREHENSIVE METABOLIC PANEL - Abnormal; Notable for the following components:      Result Value   CO2 17 (*)    Glucose, Bld 118 (*)    BUN 25 (*)    Creatinine, Ser 1.63 (*)    GFR calc non Af Amer 31 (*)    GFR calc Af Amer 36 (*)    All other components within normal limits  CBC WITH DIFFERENTIAL/PLATELET - Abnormal; Notable for the following components:   Hemoglobin 11.7 (*)    Neutro Abs 7.9 (*)    Lymphs Abs 0.5 (*)    All other components within normal limits  URINALYSIS, ROUTINE W REFLEX MICROSCOPIC  POC OCCULT BLOOD, ED  CBG MONITORING, ED  TYPE AND SCREEN  ABO/RH    Procedures Procedures  Medications Ordered in ED Medications  morphine 4 MG/ML injection 4 mg (has no administration in time range)  sodium chloride 0.9 % bolus 1,000 mL (1,000 mLs Intravenous New Bag/Given 02/13/19 2354)  ondansetron (ZOFRAN) injection 4 mg (4 mg Intravenous Given 02/13/19 2355)  loperamide (IMODIUM) capsule 4 mg (4 mg  Oral Given  02/13/19 2354)     Initial Impression / Assessment and Plan / ED Course  I have reviewed the triage vital signs and the nursing notes.  Pertinent labs & imaging results that were available during my care of the patient were reviewed by me and considered in my medical decision making (see chart for details).  Nausea, vomiting, diarrhea with report of melena.  Stool is Hemoccult negative, so I suspect that dark color is related to Pepto-Bismol.  Patient appears hemodynamically stable in the ED, will check orthostatic vital signs and screening labs.  She will be given IV fluids, ondansetron, oral loperamide.  Old records are reviewed, and she has no relevant past visits.  Following above-noted treatment, nausea and diarrhea have improved but she is still having some lower abdominal pain-especially left lower quadrant.  On reexam, there is still mild to moderate left lower quadrant tenderness but no rebound or guarding.  Labs showed normal WBC.  Mild anemia is present which is actually improved over baseline.  Renal insufficiency is present unchanged from baseline.  Low CO2 is noted with normal anion gap probably related to recent vomiting.  I have recommended CT of abdomen and pelvis, but patient states she does not want to have scan done.  With relatively benign abdominal exam, I feel this is reasonable.  She is given a single dose of morphine and is discharged with prescription for ondansetron but with strict instructions to return if pain is worsening or she develops fever or recurrence of vomiting.  She is to follow-up with PCP in 2 days.  Final Clinical Impressions(s) / ED Diagnoses   Final diagnoses:  Nausea vomiting and diarrhea  LLQ abdominal pain  Renal insufficiency  Normochromic normocytic anemia    ED Discharge Orders         Ordered    ondansetron (ZOFRAN) 4 MG tablet  Every 6 hours PRN     02/14/19 6067           Delora Fuel, MD 70/34/03 0120

## 2019-02-14 DIAGNOSIS — R112 Nausea with vomiting, unspecified: Secondary | ICD-10-CM | POA: Diagnosis not present

## 2019-02-14 LAB — COMPREHENSIVE METABOLIC PANEL
ALT: 15 U/L (ref 0–44)
AST: 23 U/L (ref 15–41)
Albumin: 4.5 g/dL (ref 3.5–5.0)
Alkaline Phosphatase: 117 U/L (ref 38–126)
Anion gap: 10 (ref 5–15)
BUN: 25 mg/dL — AB (ref 8–23)
CO2: 17 mmol/L — ABNORMAL LOW (ref 22–32)
Calcium: 9.4 mg/dL (ref 8.9–10.3)
Chloride: 110 mmol/L (ref 98–111)
Creatinine, Ser: 1.63 mg/dL — ABNORMAL HIGH (ref 0.44–1.00)
GFR calc Af Amer: 36 mL/min — ABNORMAL LOW (ref >60)
GFR, EST NON AFRICAN AMERICAN: 31 mL/min — AB (ref >60)
Glucose, Bld: 118 mg/dL — ABNORMAL HIGH (ref 70–99)
Potassium: 4.2 mmol/L (ref 3.5–5.1)
Sodium: 137 mmol/L (ref 135–145)
Total Bilirubin: 0.7 mg/dL (ref 0.3–1.2)
Total Protein: 7.7 g/dL (ref 6.5–8.1)

## 2019-02-14 LAB — CBC WITH DIFFERENTIAL/PLATELET
Abs Immature Granulocytes: 0.06 10*3/uL (ref 0.00–0.07)
Basophils Absolute: 0 10*3/uL (ref 0.0–0.1)
Basophils Relative: 0 %
Eosinophils Absolute: 0 10*3/uL (ref 0.0–0.5)
Eosinophils Relative: 0 %
HEMATOCRIT: 38.1 % (ref 36.0–46.0)
Hemoglobin: 11.7 g/dL — ABNORMAL LOW (ref 12.0–15.0)
Immature Granulocytes: 1 %
Lymphocytes Relative: 6 %
Lymphs Abs: 0.5 10*3/uL — ABNORMAL LOW (ref 0.7–4.0)
MCH: 27 pg (ref 26.0–34.0)
MCHC: 30.7 g/dL (ref 30.0–36.0)
MCV: 88 fL (ref 80.0–100.0)
MONO ABS: 0.3 10*3/uL (ref 0.1–1.0)
Monocytes Relative: 4 %
Neutro Abs: 7.9 10*3/uL — ABNORMAL HIGH (ref 1.7–7.7)
Neutrophils Relative %: 89 %
Platelets: 226 10*3/uL (ref 150–400)
RBC: 4.33 MIL/uL (ref 3.87–5.11)
RDW: 14.5 % (ref 11.5–15.5)
WBC: 8.9 10*3/uL (ref 4.0–10.5)
nRBC: 0 % (ref 0.0–0.2)

## 2019-02-14 LAB — TYPE AND SCREEN
ABO/RH(D): O POS
Antibody Screen: NEGATIVE

## 2019-02-14 LAB — ABO/RH: ABO/RH(D): O POS

## 2019-02-14 MED ORDER — MORPHINE SULFATE (PF) 4 MG/ML IV SOLN
4.0000 mg | Freq: Once | INTRAVENOUS | Status: AC
  Administered 2019-02-14: 4 mg via INTRAVENOUS
  Filled 2019-02-14: qty 1

## 2019-02-14 MED ORDER — ONDANSETRON HCL 4 MG PO TABS: 4.0000 mg | ORAL_TABLET | Freq: Four times a day (QID) | ORAL | 0 refills | Status: DC | PRN

## 2019-02-14 NOTE — Discharge Instructions (Addendum)
Return if your pain is getting worse, if you start running a fever, or if vomiting comes back and is not controlled by ondansetron (Zofran).   Take loperamide (Imodium AD) as needed for diarrhea.

## 2019-03-02 ENCOUNTER — Other Ambulatory Visit: Payer: Self-pay | Admitting: Internal Medicine

## 2019-03-06 ENCOUNTER — Other Ambulatory Visit: Payer: Self-pay | Admitting: Internal Medicine

## 2019-05-24 DIAGNOSIS — R1310 Dysphagia, unspecified: Secondary | ICD-10-CM | POA: Diagnosis not present

## 2019-05-24 DIAGNOSIS — Z01818 Encounter for other preprocedural examination: Secondary | ICD-10-CM | POA: Diagnosis not present

## 2019-05-24 DIAGNOSIS — Z1159 Encounter for screening for other viral diseases: Secondary | ICD-10-CM | POA: Diagnosis not present

## 2019-05-24 DIAGNOSIS — Z01812 Encounter for preprocedural laboratory examination: Secondary | ICD-10-CM | POA: Diagnosis not present

## 2019-05-30 DIAGNOSIS — I10 Essential (primary) hypertension: Secondary | ICD-10-CM | POA: Diagnosis not present

## 2019-05-30 DIAGNOSIS — Z87891 Personal history of nicotine dependence: Secondary | ICD-10-CM | POA: Diagnosis not present

## 2019-05-30 DIAGNOSIS — R1310 Dysphagia, unspecified: Secondary | ICD-10-CM | POA: Diagnosis not present

## 2019-05-30 DIAGNOSIS — J392 Other diseases of pharynx: Secondary | ICD-10-CM | POA: Diagnosis not present

## 2019-05-30 DIAGNOSIS — Z9002 Acquired absence of larynx: Secondary | ICD-10-CM | POA: Diagnosis not present

## 2019-05-30 DIAGNOSIS — R491 Aphonia: Secondary | ICD-10-CM | POA: Diagnosis not present

## 2019-05-30 DIAGNOSIS — Z8521 Personal history of malignant neoplasm of larynx: Secondary | ICD-10-CM | POA: Diagnosis not present

## 2019-06-07 ENCOUNTER — Other Ambulatory Visit: Payer: Self-pay | Admitting: Internal Medicine

## 2019-07-15 ENCOUNTER — Other Ambulatory Visit: Payer: Self-pay | Admitting: Internal Medicine

## 2019-07-15 DIAGNOSIS — J301 Allergic rhinitis due to pollen: Secondary | ICD-10-CM

## 2019-07-27 ENCOUNTER — Other Ambulatory Visit: Payer: Self-pay | Admitting: Internal Medicine

## 2019-07-27 DIAGNOSIS — I10 Essential (primary) hypertension: Secondary | ICD-10-CM

## 2019-07-28 ENCOUNTER — Telehealth: Payer: Self-pay | Admitting: Internal Medicine

## 2019-07-28 NOTE — Telephone Encounter (Signed)
Can enough BP medication be sent in for pt to get her to the 08/169/2020 appt?

## 2019-07-28 NOTE — Telephone Encounter (Signed)
Patient is currently out of BP meds.  Daughter in law has scheduled patient for a follow up with Dr. Ronnald Ramp on 8/19.  Is requesting enough meds to be sent in by a provider today to get patient through until the appt.

## 2019-07-29 ENCOUNTER — Other Ambulatory Visit: Payer: Self-pay | Admitting: Internal Medicine

## 2019-07-29 DIAGNOSIS — I10 Essential (primary) hypertension: Secondary | ICD-10-CM

## 2019-08-02 ENCOUNTER — Other Ambulatory Visit: Payer: Self-pay | Admitting: Internal Medicine

## 2019-08-02 ENCOUNTER — Ambulatory Visit: Payer: Medicare Other | Admitting: Internal Medicine

## 2019-08-02 DIAGNOSIS — F411 Generalized anxiety disorder: Secondary | ICD-10-CM

## 2019-08-08 ENCOUNTER — Ambulatory Visit (INDEPENDENT_AMBULATORY_CARE_PROVIDER_SITE_OTHER): Payer: Medicare Other | Admitting: Internal Medicine

## 2019-08-08 ENCOUNTER — Other Ambulatory Visit (INDEPENDENT_AMBULATORY_CARE_PROVIDER_SITE_OTHER): Payer: Medicare Other

## 2019-08-08 ENCOUNTER — Encounter: Payer: Self-pay | Admitting: Internal Medicine

## 2019-08-08 ENCOUNTER — Other Ambulatory Visit: Payer: Self-pay

## 2019-08-08 VITALS — BP 142/78 | HR 69 | Temp 98.1°F | Resp 16 | Ht 64.0 in | Wt 127.2 lb

## 2019-08-08 DIAGNOSIS — E519 Thiamine deficiency, unspecified: Secondary | ICD-10-CM

## 2019-08-08 DIAGNOSIS — N184 Chronic kidney disease, stage 4 (severe): Secondary | ICD-10-CM

## 2019-08-08 DIAGNOSIS — R7303 Prediabetes: Secondary | ICD-10-CM | POA: Diagnosis not present

## 2019-08-08 DIAGNOSIS — E781 Pure hyperglyceridemia: Secondary | ICD-10-CM | POA: Diagnosis not present

## 2019-08-08 DIAGNOSIS — D520 Dietary folate deficiency anemia: Secondary | ICD-10-CM

## 2019-08-08 DIAGNOSIS — M8580 Other specified disorders of bone density and structure, unspecified site: Secondary | ICD-10-CM

## 2019-08-08 DIAGNOSIS — L2084 Intrinsic (allergic) eczema: Secondary | ICD-10-CM | POA: Diagnosis not present

## 2019-08-08 DIAGNOSIS — I10 Essential (primary) hypertension: Secondary | ICD-10-CM

## 2019-08-08 DIAGNOSIS — Z23 Encounter for immunization: Secondary | ICD-10-CM | POA: Diagnosis not present

## 2019-08-08 DIAGNOSIS — R7989 Other specified abnormal findings of blood chemistry: Secondary | ICD-10-CM

## 2019-08-08 LAB — BASIC METABOLIC PANEL
BUN: 27 mg/dL — ABNORMAL HIGH (ref 6–23)
CO2: 27 mEq/L (ref 19–32)
Calcium: 9.5 mg/dL (ref 8.4–10.5)
Chloride: 104 mEq/L (ref 96–112)
Creatinine, Ser: 1.43 mg/dL — ABNORMAL HIGH (ref 0.40–1.20)
GFR: 35.9 mL/min — ABNORMAL LOW (ref >60.00)
Glucose, Bld: 108 mg/dL — ABNORMAL HIGH (ref 70–99)
Potassium: 3.8 mEq/L (ref 3.5–5.1)
Sodium: 141 mEq/L (ref 135–145)

## 2019-08-08 LAB — TSH: TSH: 7.65 u[IU]/mL — ABNORMAL HIGH (ref 0.35–4.50)

## 2019-08-08 LAB — CBC WITH DIFFERENTIAL/PLATELET
Basophils Absolute: 0 10*3/uL (ref 0.0–0.1)
Basophils Relative: 0.7 % (ref 0.0–3.0)
Eosinophils Absolute: 0.1 10*3/uL (ref 0.0–0.7)
Eosinophils Relative: 1.8 % (ref 0.0–5.0)
HCT: 32.3 % — ABNORMAL LOW (ref 36.0–46.0)
Hemoglobin: 10.7 g/dL — ABNORMAL LOW (ref 12.0–15.0)
Lymphocytes Relative: 20.2 % (ref 12.0–46.0)
Lymphs Abs: 1.1 10*3/uL (ref 0.7–4.0)
MCHC: 33 g/dL (ref 30.0–36.0)
MCV: 81.5 fl (ref 78.0–100.0)
Monocytes Absolute: 0.5 10*3/uL (ref 0.1–1.0)
Monocytes Relative: 8 % (ref 3.0–12.0)
Neutro Abs: 3.9 10*3/uL (ref 1.4–7.7)
Neutrophils Relative %: 69.3 % (ref 43.0–77.0)
Platelets: 250 10*3/uL (ref 150.0–400.0)
RBC: 3.97 Mil/uL (ref 3.87–5.11)
RDW: 14.4 % (ref 11.5–15.5)
WBC: 5.6 10*3/uL (ref 4.0–10.5)

## 2019-08-08 LAB — URINALYSIS, ROUTINE W REFLEX MICROSCOPIC
Bilirubin Urine: NEGATIVE
Ketones, ur: NEGATIVE
Leukocytes,Ua: NEGATIVE
Nitrite: NEGATIVE
Specific Gravity, Urine: 1.025 (ref 1.000–1.030)
Total Protein, Urine: >=300 — AB
Urine Glucose: NEGATIVE
Urobilinogen, UA: 0.2 (ref 0.0–1.0)
pH: 6 (ref 5.0–8.0)

## 2019-08-08 LAB — TRIGLYCERIDES: Triglycerides: 174 mg/dL — ABNORMAL HIGH (ref 0.0–149.0)

## 2019-08-08 LAB — FOLATE: Folate: 4.5 ng/mL — ABNORMAL LOW

## 2019-08-08 LAB — VITAMIN B12: Vitamin B-12: 475 pg/mL (ref 211–911)

## 2019-08-08 LAB — HEMOGLOBIN A1C: Hgb A1c MFr Bld: 5.8 % (ref 4.6–6.5)

## 2019-08-08 MED ORDER — LEVOCETIRIZINE DIHYDROCHLORIDE 5 MG PO TABS: 5.0000 mg | ORAL_TABLET | Freq: Every evening | ORAL | 1 refills | Status: DC

## 2019-08-08 MED ORDER — FOLIC ACID 1 MG PO TABS: 1.0000 mg | ORAL_TABLET | Freq: Every day | ORAL | 1 refills | Status: DC

## 2019-08-08 MED ORDER — OLMESARTAN-AMLODIPINE-HCTZ 20-5-12.5 MG PO TABS: 0.5000 | ORAL_TABLET | Freq: Every day | ORAL | 1 refills | Status: DC

## 2019-08-08 MED ORDER — DENOSUMAB 60 MG/ML ~~LOC~~ SOSY
60.0000 mg | PREFILLED_SYRINGE | Freq: Once | SUBCUTANEOUS | Status: AC
  Administered 2019-08-08: 17:00:00 60 mg via SUBCUTANEOUS

## 2019-08-08 NOTE — Progress Notes (Signed)
Subjective:  Patient ID: [name], female    DOB: [birth year]  Age: 73 y.o. MRN: [id number]  CC: Hypertension and Anemia   HPI CALEESI KOHL presents for f/up - She is with her daughter today who helps with the history.  She complains of itchy skin and runny nose.  She does not see a rash.  She is not currently treating this.  Outpatient Medications Prior to Visit  Medication Sig Dispense Refill  . atenolol (TENORMIN) 25 MG tablet TAKE ONE TABLET (25 MG TOTAL) BY MOUTH 2 (TWO) TIMES DAILY. (Patient taking differently: Take 25 mg by mouth 2 (two) times daily. ) 180 tablet 1  . Cholecalciferol 1.25 MG (50000 UT) capsule Take 1 capsule (50,000 Units total) by mouth once a week. 12 capsule 0  . cilostazol (PLETAL) 100 MG tablet Take 1 tablet (100 mg total) by mouth 2 (two) times daily. 180 tablet 1  . diphenoxylate-atropine (LOMOTIL) 2.5-0.025 MG tablet Take 1 tablet by mouth 4 (four) times daily as needed for diarrhea or loose stools. 75 tablet 1  . escitalopram (LEXAPRO) 20 MG tablet Take 1 tablet (20 mg total) by mouth daily. 90 tablet 1  . fluticasone (FLONASE) 50 MCG/ACT nasal spray Place 2 sprays into both nostrils daily. 16 g 6  . LORazepam (ATIVAN) 1 MG tablet TAKE 1 TABLET BY MOUTH EVERY DAY 180 tablet 0  . montelukast (SINGULAIR) 10 MG tablet Take 1 tablet (10 mg total) by mouth at bedtime. 90 tablet 1  . omeprazole (PRILOSEC) 20 MG capsule Take 1 capsule (20 mg total) by mouth 2 (two) times daily before a meal. 180 capsule 1  . rosuvastatin (CRESTOR) 20 MG tablet TAKE 1 TABLET BY MOUTH EVERY DAY 90 tablet 1  . thiamine (VITAMIN B-1) 100 MG tablet Take 1 tablet (100 mg total) by mouth daily. 90 tablet 1  . Olmesartan-amLODIPine-HCTZ 20-5-12.5 MG TABS TAKE 1/2 TABLET BY MOUTH EVERY DAY 45 tablet 0  . ondansetron (ZOFRAN) 4 MG tablet Take 1 tablet (4 mg total) by mouth every 6 (six) hours as needed for nausea. 12 tablet 0   No facility-administered medications prior to  visit.     ROS Review of Systems  Constitutional: Negative for diaphoresis, fatigue and unexpected weight change.  HENT: Negative.   Eyes: Negative for visual disturbance.  Respiratory: Negative for cough, chest tightness, shortness of breath and wheezing.   Cardiovascular: Negative for chest pain, palpitations and leg swelling.  Gastrointestinal: Negative for abdominal pain, constipation, diarrhea, nausea and vomiting.  Endocrine: Negative.   Genitourinary: Negative.  Negative for difficulty urinating and dysuria.  Musculoskeletal: Negative.  Negative for myalgias.  Skin: Negative.  Negative for color change.  Neurological: Negative.  Negative for dizziness, weakness and light-headedness.  Hematological: Negative for adenopathy. Does not bruise/bleed easily.  Psychiatric/Behavioral: Negative.     Objective:  BP (!) 142/78 (BP Location: Left Arm, Patient Position: Sitting, Cuff Size: Normal)   Pulse 69   Temp 98.1 F (36.7 C) (Oral)   Resp 16   Ht 5\' 4"  (1.626 m)   Wt 127 lb 4 oz (57.7 kg)   SpO2 93%   BMI 21.84 kg/m   BP Readings from Last 3 Encounters:  08/08/19 (!) 142/78  02/14/19 (!) 148/70  01/19/19 130/60    Wt Readings from Last 3 Encounters:  08/08/19 127 lb 4 oz (57.7 kg)  02/13/19 129 lb (58.5 kg)  01/19/19 128 lb (58.1 kg)    Physical Exam  Lab  Results  Component Value Date   WBC 5.6 08/08/2019   HGB 10.7 (L) 08/08/2019   HCT 32.3 (L) 08/08/2019   PLT 250.0 08/08/2019   GLUCOSE 108 (H) 08/08/2019   CHOL 190 01/19/2019   TRIG 174.0 (H) 08/08/2019   HDL 46.70 01/19/2019   LDLDIRECT 90.0 01/19/2019   ALT 15 02/13/2019   AST 23 02/13/2019   NA 141 08/08/2019   K 3.8 08/08/2019   CL 104 08/08/2019   CREATININE 1.43 (H) 08/08/2019   BUN 27 (H) 08/08/2019   CO2 27 08/08/2019   TSH 7.65 (H) 08/08/2019   INR 1.17 08/17/2012   HGBA1C 5.8 08/08/2019    No results found.  Assessment & Plan:   Rheda was seen today for hypertension and anemia.   Diagnoses and all orders for this visit:  Essential hypertension- Her blood pressure is adequately well controlled.  Electrolytes are normal.  Renal function is declining and she has proteinuria. -     TSH; Future -     Basic metabolic panel; Future -     Olmesartan-amLODIPine-HCTZ 20-5-12.5 MG TABS; Take 0.5 tablets by mouth daily. -     Urinalysis, Routine w reflex microscopic; Future  CRI (chronic renal insufficiency), stage 4 (severe) ([place])- I have asked her to see nephrology to have this evaluated and treated. -     Basic metabolic panel; Future -     Urinalysis, Routine w reflex microscopic; Future -     Ambulatory referral to Nephrology  Dietary folate deficiency anemia- Her folate level and H/H remain low. I have asked her to restart the folate supplement. -     Folate; Future -     Vitamin B12; Future -     CBC with Differential/Platelet; Future -     folic acid (FOLVITE) 1 MG tablet; Take 1 tablet (1 mg total) by mouth daily.  Prediabetes -     Hemoglobin A1c; Future  Pure hyperglyceridemia -     Triglycerides; Future  TSH elevation- Her TSH is stable and clinically she appears euthyroid.  I do not think thyroid replacement therapy is indicated at this time. -     TSH; Future  Thiamine deficiency -     Vitamin B1; Future -     CBC with Differential/Platelet; Future  Need for influenza vaccination -     Flu Vaccine QUAD High Dose(Fluad)  Intrinsic eczema -     levocetirizine (XYZAL) 5 MG tablet; Take 1 tablet (5 mg total) by mouth every evening.  Osteopenia, unspecified location -     denosumab (PROLIA) injection 60 mg   I have discontinued Cloria D. Kun's ondansetron. I have also changed her Olmesartan-amLODIPine-HCTZ. Additionally, I am having her start on levocetirizine and folic acid. Lastly, I am having her maintain her fluticasone, diphenoxylate-atropine, escitalopram, atenolol, omeprazole, thiamine, Cholecalciferol, cilostazol, rosuvastatin, montelukast,  and LORazepam. We administered denosumab.  Meds ordered this encounter  Medications  . levocetirizine (XYZAL) 5 MG tablet    Sig: Take 1 tablet (5 mg total) by mouth every evening.    Dispense:  90 tablet    Refill:  1  . Olmesartan-amLODIPine-HCTZ 20-5-12.5 MG TABS    Sig: Take 0.5 tablets by mouth daily.    Dispense:  45 tablet    Refill:  1  . denosumab (PROLIA) injection 60 mg  . folic acid (FOLVITE) 1 MG tablet    Sig: Take 1 tablet (1 mg total) by mouth daily.    Dispense:  90 tablet  Refill:  1     Follow-up: Return in about 4 months (around 12/08/2019).  Scarlette Calico, MD

## 2019-08-08 NOTE — Patient Instructions (Signed)
Anemia  Anemia is a condition in which you do not have enough red blood cells or hemoglobin. Hemoglobin is a substance in red blood cells that carries oxygen. When you do not have enough red blood cells or hemoglobin (are anemic), your body cannot get enough oxygen and your organs may not work properly. As a result, you may feel very tired or have other problems. What are the causes? Common causes of anemia include:  Excessive bleeding. Anemia can be caused by excessive bleeding inside or outside the body, including bleeding from the intestine or from periods in women.  Poor nutrition.  Long-lasting (chronic) kidney, thyroid, and liver disease.  Bone marrow disorders.  Cancer and treatments for cancer.  HIV (human immunodeficiency virus) and AIDS (acquired immunodeficiency syndrome).  Treatments for HIV and AIDS.  Spleen problems.  Blood disorders.  Infections, medicines, and autoimmune disorders that destroy red blood cells. What are the signs or symptoms? Symptoms of this condition include:  Minor weakness.  Dizziness.  Headache.  Feeling heartbeats that are irregular or faster than normal (palpitations).  Shortness of breath, especially with exercise.  Paleness.  Cold sensitivity.  Indigestion.  Nausea.  Difficulty sleeping.  Difficulty concentrating. Symptoms may occur suddenly or develop slowly. If your anemia is mild, you may not have symptoms. How is this diagnosed? This condition is diagnosed based on:  Blood tests.  Your medical history.  A physical exam.  Bone marrow biopsy. Your health care provider may also check your stool (feces) for blood and may do additional testing to look for the cause of your bleeding. You may also have other tests, including:  Imaging tests, such as a CT scan or MRI.  Endoscopy.  Colonoscopy. How is this treated? Treatment for this condition depends on the cause. If you continue to lose a lot of blood, you may  need to be treated at a hospital. Treatment may include:  Taking supplements of iron, vitamin S31, or folic acid.  Taking a hormone medicine (erythropoietin) that can help to stimulate red blood cell growth.  Having a blood transfusion. This may be needed if you lose a lot of blood.  Making changes to your diet.  Having surgery to remove your spleen. Follow these instructions at home:  Take over-the-counter and prescription medicines only as told by your health care provider.  Take supplements only as told by your health care provider.  Follow any diet instructions that you were given.  Keep all follow-up visits as told by your health care provider. This is important. Contact a health care provider if:  You develop new bleeding anywhere in the body. Get help right away if:  You are very weak.  You are short of breath.  You have pain in your abdomen or chest.  You are dizzy or feel faint.  You have trouble concentrating.  You have bloody or black, tarry stools.  You vomit repeatedly or you vomit up blood. Summary  Anemia is a condition in which you do not have enough red blood cells or enough of a substance in your red blood cells that carries oxygen (hemoglobin).  Symptoms may occur suddenly or develop slowly.  If your anemia is mild, you may not have symptoms.  This condition is diagnosed with blood tests as well as a medical history and physical exam. Other tests may be needed.  Treatment for this condition depends on the cause of the anemia. This information is not intended to replace advice given to you by  your health care provider. Make sure you discuss any questions you have with your health care provider. Document Released: 01/07/2005 Document Revised: 11/12/2017 Document Reviewed: 01/01/2017 Elsevier Patient Education  2020 [employer].

## 2019-08-10 DIAGNOSIS — R490 Dysphonia: Secondary | ICD-10-CM | POA: Diagnosis not present

## 2019-08-13 ENCOUNTER — Encounter: Payer: Self-pay | Admitting: Internal Medicine

## 2019-08-13 LAB — VITAMIN B1: Vitamin B1 (Thiamine): 12 nmol/L (ref 8–30)

## 2019-08-14 ENCOUNTER — Telehealth: Payer: Self-pay | Admitting: Internal Medicine

## 2019-08-14 NOTE — Telephone Encounter (Signed)
Spoke to pt's daughter in law regarding pt's nephrology referral.  Referral has been placed previously and pt's appt has been cancelled 2x.  She states that pt doesn't feel she needs to see a nephrologist and I explained to her it is due to her chronic renal sufficiency.  Can you please give her a call and explain to her exactly what this means so she can explain it to pt.  She can call [facility] at [phone] to reschedule her appt.  Please let me know if she agrees to go so I can fax last OV notes to them  thanks

## 2019-08-15 ENCOUNTER — Other Ambulatory Visit: Payer: Self-pay | Admitting: Internal Medicine

## 2019-08-15 DIAGNOSIS — F411 Generalized anxiety disorder: Secondary | ICD-10-CM

## 2019-08-16 NOTE — Telephone Encounter (Signed)
Called Jessica back. No answer.

## 2019-08-16 NOTE — Telephone Encounter (Signed)
lvm for Janett Billow (pt dtr in law) to call back.

## 2019-08-16 NOTE — Telephone Encounter (Signed)
Janett Billow returned call. Unable to reach office. Please advise.

## 2019-08-30 ENCOUNTER — Other Ambulatory Visit: Payer: Self-pay | Admitting: Internal Medicine

## 2019-08-30 DIAGNOSIS — I10 Essential (primary) hypertension: Secondary | ICD-10-CM

## 2019-08-30 DIAGNOSIS — K2100 Gastro-esophageal reflux disease with esophagitis, without bleeding: Secondary | ICD-10-CM

## 2019-08-31 DIAGNOSIS — Z20828 Contact with and (suspected) exposure to other viral communicable diseases: Secondary | ICD-10-CM | POA: Diagnosis not present

## 2019-08-31 DIAGNOSIS — Z01812 Encounter for preprocedural laboratory examination: Secondary | ICD-10-CM | POA: Diagnosis not present

## 2019-08-31 DIAGNOSIS — K222 Esophageal obstruction: Secondary | ICD-10-CM | POA: Diagnosis not present

## 2019-09-04 ENCOUNTER — Other Ambulatory Visit: Payer: Self-pay | Admitting: Internal Medicine

## 2019-09-04 DIAGNOSIS — I10 Essential (primary) hypertension: Secondary | ICD-10-CM

## 2019-09-04 DIAGNOSIS — E559 Vitamin D deficiency, unspecified: Secondary | ICD-10-CM

## 2019-09-07 DIAGNOSIS — R491 Aphonia: Secondary | ICD-10-CM | POA: Diagnosis not present

## 2019-09-07 DIAGNOSIS — Z9002 Acquired absence of larynx: Secondary | ICD-10-CM | POA: Diagnosis not present

## 2019-09-07 DIAGNOSIS — Z8521 Personal history of malignant neoplasm of larynx: Secondary | ICD-10-CM | POA: Diagnosis not present

## 2019-09-07 DIAGNOSIS — Z930 Tracheostomy status: Secondary | ICD-10-CM | POA: Diagnosis not present

## 2019-09-07 DIAGNOSIS — R1314 Dysphagia, pharyngoesophageal phase: Secondary | ICD-10-CM | POA: Diagnosis not present

## 2019-09-07 DIAGNOSIS — J392 Other diseases of pharynx: Secondary | ICD-10-CM | POA: Diagnosis not present

## 2019-09-07 DIAGNOSIS — R498 Other voice and resonance disorders: Secondary | ICD-10-CM | POA: Diagnosis not present

## 2019-09-07 DIAGNOSIS — L905 Scar conditions and fibrosis of skin: Secondary | ICD-10-CM | POA: Diagnosis not present

## 2019-09-07 DIAGNOSIS — R1310 Dysphagia, unspecified: Secondary | ICD-10-CM | POA: Diagnosis not present

## 2019-09-09 ENCOUNTER — Other Ambulatory Visit: Payer: Self-pay | Admitting: Internal Medicine

## 2019-09-09 DIAGNOSIS — R1314 Dysphagia, pharyngoesophageal phase: Secondary | ICD-10-CM

## 2019-09-09 DIAGNOSIS — K219 Gastro-esophageal reflux disease without esophagitis: Secondary | ICD-10-CM

## 2019-09-09 MED ORDER — FAMOTIDINE 40 MG PO TABS: 40.0000 mg | ORAL_TABLET | Freq: Every day | ORAL | 1 refills | Status: DC

## 2019-09-26 DIAGNOSIS — R1314 Dysphagia, pharyngoesophageal phase: Secondary | ICD-10-CM | POA: Diagnosis not present

## 2019-09-26 DIAGNOSIS — K222 Esophageal obstruction: Secondary | ICD-10-CM | POA: Diagnosis not present

## 2019-09-26 DIAGNOSIS — R1310 Dysphagia, unspecified: Secondary | ICD-10-CM | POA: Diagnosis not present

## 2019-10-02 ENCOUNTER — Encounter: Payer: Self-pay | Admitting: Internal Medicine

## 2019-10-02 ENCOUNTER — Ambulatory Visit (INDEPENDENT_AMBULATORY_CARE_PROVIDER_SITE_OTHER): Payer: Medicare Other | Admitting: Internal Medicine

## 2019-10-02 ENCOUNTER — Other Ambulatory Visit: Payer: Self-pay

## 2019-10-02 VITALS — BP 140/80 | HR 77 | Temp 98.9°F | Ht 64.0 in | Wt 125.0 lb

## 2019-10-02 DIAGNOSIS — H5789 Other specified disorders of eye and adnexa: Secondary | ICD-10-CM | POA: Diagnosis not present

## 2019-10-02 MED ORDER — METHYLPREDNISOLONE ACETATE 40 MG/ML IJ SUSP
40.0000 mg | Freq: Once | INTRAMUSCULAR | Status: AC
  Administered 2019-10-02: 16:00:00 40 mg via INTRAMUSCULAR

## 2019-10-02 MED ORDER — SULFAMETHOXAZOLE-TRIMETHOPRIM 800-160 MG PO TABS: 1.0000 | ORAL_TABLET | Freq: Two times a day (BID) | ORAL | 0 refills | Status: DC

## 2019-10-02 NOTE — Progress Notes (Signed)
   Subjective:   Patient ID: [name], female    DOB: Jul 06, 1946, 73 y.o.   MRN: [id number]  HPI The patient is a 73 YO female coming in for eye problem. Used a wart removing product on a wart which was immediately proximal to her left eye. About 1-2 days later she got a red rash on that area and swelling around her left eye. The swelling was initially limited to the upper eyelid. The following day both upper and lower eyelid were involved. They have tried nothing for this. Now both are still swelling and there is redness around the whole eye. She does have pain in the area of the wart. Denies change or loss of vision. Denies double vision or blurring. Denies drainage from the eye. Overall not improving so they decided to come in. Started 3-4 days ago now.   Review of Systems  Constitutional: Negative.   HENT: Positive for facial swelling.   Eyes: Positive for pain and redness.  Respiratory: Negative for cough, chest tightness and shortness of breath.   Cardiovascular: Negative for chest pain, palpitations and leg swelling.  Gastrointestinal: Negative for abdominal distention, abdominal pain, constipation, diarrhea, nausea and vomiting.  Musculoskeletal: Negative.   Skin: Negative.   Neurological: Negative.   Psychiatric/Behavioral: Negative.     Objective:  Physical Exam Constitutional:      Appearance: She is well-developed.  HENT:     Head: Normocephalic and atraumatic.  Eyes:     Extraocular Movements: Extraocular movements intact.     Conjunctiva/sclera: Conjunctivae normal.     Pupils: Pupils are equal, round, and reactive to light.     Comments: Swelling in the upper and lower left eyelid, EOM intact and conjunctiva normal, the mole in question is tender to touch and redness surrounding and on the eyelids.   Neck:     Musculoskeletal: Normal range of motion.  Cardiovascular:     Rate and Rhythm: Normal rate and regular rhythm.  Pulmonary:     Effort: Pulmonary  effort is normal. No respiratory distress.     Breath sounds: Normal breath sounds. No wheezing or rales.  Abdominal:     General: Bowel sounds are normal. There is no distension.     Palpations: Abdomen is soft.     Tenderness: There is no abdominal tenderness. There is no rebound.  Skin:    General: Skin is warm and dry.  Neurological:     Mental Status: She is alert and oriented to person, place, and time.     Coordination: Coordination normal.     Vitals:   10/02/19 1557 10/02/19 1627  BP: (!) 150/70 140/80  Pulse: 77   Temp: 98.9 F (37.2 C)   TempSrc: Oral   SpO2: 98%   Weight: 125 lb (56.7 kg)   Height: 5\' 4"  (1.626 m)     Assessment & Plan:  Depo-medrol 40 mg IM

## 2019-10-02 NOTE — Patient Instructions (Signed)
  Hemos enviado a bactrim para que tome [place] 5 das.  Llmenos si la hinchazn empeora, el cambio o la prdida de la visin o el enrojecimiento que empeora o se extiende.  We have sent in bactrim to take 1 pill twice a day for 5 days.   Call us for worsening swelling, change or loss of vision, or redness which is worsening or spreading.

## 2019-10-03 ENCOUNTER — Encounter: Payer: Self-pay | Admitting: Internal Medicine

## 2019-10-03 DIAGNOSIS — H5789 Other specified disorders of eye and adnexa: Secondary | ICD-10-CM | POA: Insufficient documentation

## 2019-10-03 NOTE — Assessment & Plan Note (Signed)
Likely allergic reaction to the wart removing product. Advised not to use this again. Given depo-medrol 40 mg IM and bactrim 5 day course given location and redness on exam. EOM intact and conjunctiva normal.

## 2019-11-30 ENCOUNTER — Other Ambulatory Visit: Payer: Self-pay | Admitting: Internal Medicine

## 2019-11-30 DIAGNOSIS — E559 Vitamin D deficiency, unspecified: Secondary | ICD-10-CM

## 2019-12-21 ENCOUNTER — Other Ambulatory Visit: Payer: Self-pay | Admitting: Internal Medicine

## 2020-01-10 DIAGNOSIS — R1314 Dysphagia, pharyngoesophageal phase: Secondary | ICD-10-CM | POA: Diagnosis not present

## 2020-01-10 DIAGNOSIS — Z79899 Other long term (current) drug therapy: Secondary | ICD-10-CM | POA: Diagnosis not present

## 2020-01-10 DIAGNOSIS — Z87891 Personal history of nicotine dependence: Secondary | ICD-10-CM | POA: Diagnosis not present

## 2020-01-10 DIAGNOSIS — R490 Dysphonia: Secondary | ICD-10-CM | POA: Diagnosis not present

## 2020-01-10 DIAGNOSIS — Z9002 Acquired absence of larynx: Secondary | ICD-10-CM | POA: Diagnosis not present

## 2020-01-10 DIAGNOSIS — M542 Cervicalgia: Secondary | ICD-10-CM | POA: Diagnosis not present

## 2020-01-10 DIAGNOSIS — Z7951 Long term (current) use of inhaled steroids: Secondary | ICD-10-CM | POA: Diagnosis not present

## 2020-01-14 ENCOUNTER — Other Ambulatory Visit: Payer: Self-pay | Admitting: Internal Medicine

## 2020-01-14 DIAGNOSIS — J301 Allergic rhinitis due to pollen: Secondary | ICD-10-CM

## 2020-02-03 ENCOUNTER — Other Ambulatory Visit: Payer: Self-pay | Admitting: Internal Medicine

## 2020-02-03 DIAGNOSIS — F411 Generalized anxiety disorder: Secondary | ICD-10-CM

## 2020-02-09 ENCOUNTER — Other Ambulatory Visit: Payer: Self-pay

## 2020-02-09 DIAGNOSIS — I10 Essential (primary) hypertension: Secondary | ICD-10-CM

## 2020-02-09 MED ORDER — OLMESARTAN-AMLODIPINE-HCTZ 20-5-12.5 MG PO TABS: 0.5000 | ORAL_TABLET | Freq: Every day | ORAL | 0 refills | Status: DC

## 2020-02-15 ENCOUNTER — Ambulatory Visit (INDEPENDENT_AMBULATORY_CARE_PROVIDER_SITE_OTHER): Payer: Medicare Other | Admitting: Internal Medicine

## 2020-02-15 ENCOUNTER — Other Ambulatory Visit: Payer: Self-pay

## 2020-02-15 ENCOUNTER — Encounter: Payer: Self-pay | Admitting: Internal Medicine

## 2020-02-15 VITALS — BP 178/82 | HR 68 | Temp 98.2°F | Resp 20 | Ht 64.0 in | Wt 125.0 lb

## 2020-02-15 DIAGNOSIS — N184 Chronic kidney disease, stage 4 (severe): Secondary | ICD-10-CM

## 2020-02-15 DIAGNOSIS — R1314 Dysphagia, pharyngoesophageal phase: Secondary | ICD-10-CM | POA: Diagnosis not present

## 2020-02-15 DIAGNOSIS — E785 Hyperlipidemia, unspecified: Secondary | ICD-10-CM | POA: Diagnosis not present

## 2020-02-15 DIAGNOSIS — B354 Tinea corporis: Secondary | ICD-10-CM | POA: Diagnosis not present

## 2020-02-15 DIAGNOSIS — Z1231 Encounter for screening mammogram for malignant neoplasm of breast: Secondary | ICD-10-CM

## 2020-02-15 DIAGNOSIS — R7989 Other specified abnormal findings of blood chemistry: Secondary | ICD-10-CM | POA: Diagnosis not present

## 2020-02-15 DIAGNOSIS — D520 Dietary folate deficiency anemia: Secondary | ICD-10-CM

## 2020-02-15 DIAGNOSIS — R197 Diarrhea, unspecified: Secondary | ICD-10-CM

## 2020-02-15 DIAGNOSIS — K219 Gastro-esophageal reflux disease without esophagitis: Secondary | ICD-10-CM | POA: Diagnosis not present

## 2020-02-15 DIAGNOSIS — E519 Thiamine deficiency, unspecified: Secondary | ICD-10-CM

## 2020-02-15 DIAGNOSIS — M810 Age-related osteoporosis without current pathological fracture: Secondary | ICD-10-CM

## 2020-02-15 DIAGNOSIS — I10 Essential (primary) hypertension: Secondary | ICD-10-CM

## 2020-02-15 DIAGNOSIS — N2889 Other specified disorders of kidney and ureter: Secondary | ICD-10-CM

## 2020-02-15 DIAGNOSIS — I739 Peripheral vascular disease, unspecified: Secondary | ICD-10-CM

## 2020-02-15 MED ORDER — CICLOPIROX 0.77 % EX GEL: 1.0000 | Freq: Two times a day (BID) | CUTANEOUS | 1 refills | Status: DC

## 2020-02-15 MED ORDER — OLMESARTAN-AMLODIPINE-HCTZ 20-5-12.5 MG PO TABS: 1.0000 | ORAL_TABLET | Freq: Every day | ORAL | 1 refills | Status: DC

## 2020-02-15 MED ORDER — ATENOLOL 25 MG PO TABS: ORAL_TABLET | ORAL | 1 refills | Status: DC

## 2020-02-15 MED ORDER — CILOSTAZOL 100 MG PO TABS: 100.0000 mg | ORAL_TABLET | Freq: Two times a day (BID) | ORAL | 1 refills | Status: DC

## 2020-02-15 MED ORDER — FOLIC ACID 1 MG PO TABS: 1.0000 mg | ORAL_TABLET | Freq: Every day | ORAL | 1 refills | Status: DC

## 2020-02-15 MED ORDER — DENOSUMAB 60 MG/ML ~~LOC~~ SOSY
60.0000 mg | PREFILLED_SYRINGE | Freq: Once | SUBCUTANEOUS | Status: AC
  Administered 2020-02-15: 60 mg via SUBCUTANEOUS

## 2020-02-15 MED ORDER — FAMOTIDINE 40 MG PO TABS: 40.0000 mg | ORAL_TABLET | Freq: Every day | ORAL | 1 refills | Status: DC

## 2020-02-15 MED ORDER — ROSUVASTATIN CALCIUM 20 MG PO TABS: 20.0000 mg | ORAL_TABLET | Freq: Every day | ORAL | 1 refills | Status: DC

## 2020-02-15 MED ORDER — DIPHENOXYLATE-ATROPINE 2.5-0.025 MG PO TABS: 1.0000 | ORAL_TABLET | Freq: Four times a day (QID) | ORAL | 1 refills | Status: DC | PRN

## 2020-02-15 NOTE — Patient Instructions (Signed)

## 2020-02-15 NOTE — Progress Notes (Signed)
Subjective:  Patient ID: [name], female    DOB: 1946/06/28  Age: 74 y.o. MRN: [id number]  CC: Anemia and Hypertension  This visit occurred during the SARS-CoV-2 public health emergency.  Safety protocols were in place, including screening questions prior to the visit, additional usage of staff PPE, and extensive cleaning of exam room while observing appropriate contact time as indicated for disinfecting solutions.   History is obtained from her daughter who acts as Optometrist.  HPI EMILLEE TALSMA presents for f/up - She complains of a rash in between her breasts.  She says there is occasionally a foul odor.  She has also noted some redness in the area.  She has not treated this.  She complains of chronic, intermittent, nonbloody diarrhea.  She thinks the diarrhea is triggered by eating vegetables.  She requests a refill on an antidiarrheal agent.  She tells me she is compliant with the antihypertensives but is only taking 1/2 tablet of the olmesartan/amlodipine/HCTZ combination.  She denies any recent episodes of headache, blurred vision, chest pain, shortness of breath, dizziness, lightheadedness, palpitations, edema, or fatigue.  Outpatient Medications Prior to Visit  Medication Sig Dispense Refill  . D3-50 1.25 MG (50000 UT) capsule TAKE 1 CAPSULE BY MOUTH ONE TIME PER WEEK 12 capsule 0  . escitalopram (LEXAPRO) 20 MG tablet TAKE 1 TABLET BY MOUTH EVERY DAY 90 tablet 1  . fluticasone (FLONASE) 50 MCG/ACT nasal spray SPRAY 2 SPRAYS INTO EACH NOSTRIL EVERY DAY 48 mL 2  . LORazepam (ATIVAN) 1 MG tablet TAKE 1 TABLET BY MOUTH EVERY DAY 30 tablet 2  . thiamine (VITAMIN B-1) 100 MG tablet Take 1 tablet (100 mg total) by mouth daily. 90 tablet 1  . Vitamin D, Ergocalciferol, (DRISDOL) 1.25 MG (50000 UT) CAPS capsule TAKE 1 CAPSULE BY MOUTH ONE TIME PER WEEK 12 capsule 0  . atenolol (TENORMIN) 25 MG tablet TAKE 1 TABLET BY MOUTH TWICE A DAY 180 tablet 1  . cilostazol (PLETAL) 100  MG tablet TAKE 1 TABLET BY MOUTH TWICE A DAY 180 tablet 1  . diphenoxylate-atropine (LOMOTIL) 2.5-0.025 MG tablet Take 1 tablet by mouth 4 (four) times daily as needed for diarrhea or loose stools. 75 tablet 1  . famotidine (PEPCID) 40 MG tablet Take 1 tablet (40 mg total) by mouth daily. 90 tablet 1  . folic acid (FOLVITE) 1 MG tablet Take 1 tablet (1 mg total) by mouth daily. 90 tablet 1  . Olmesartan-amLODIPine-HCTZ 20-5-12.5 MG TABS Take 0.5 tablets by mouth daily. Must keep upcoming appointment to get additional refills 15 tablet 0  . rosuvastatin (CRESTOR) 20 MG tablet TAKE 1 TABLET BY MOUTH EVERY DAY 90 tablet 1  . sulfamethoxazole-trimethoprim (BACTRIM DS) 800-160 MG tablet Take 1 tablet by mouth 2 (two) times daily. 10 tablet 0  . levocetirizine (XYZAL) 5 MG tablet Take 1 tablet (5 mg total) by mouth every evening. 90 tablet 1  . montelukast (SINGULAIR) 10 MG tablet TAKE 1 TABLET BY MOUTH EVERYDAY AT BEDTIME 90 tablet 1   No facility-administered medications prior to visit.    ROS Review of Systems  Constitutional: Negative.  Negative for chills, diaphoresis and fatigue.  HENT: Negative.  Negative for sore throat and trouble swallowing.   Eyes: Negative.   Respiratory: Negative for cough, chest tightness, shortness of breath and wheezing.   Gastrointestinal: Positive for diarrhea. Negative for abdominal pain, anal bleeding, blood in stool, constipation, nausea and vomiting.  Endocrine: Negative.  Negative for cold intolerance  and heat intolerance.  Genitourinary: Negative.  Negative for difficulty urinating, dysuria and hematuria.  Musculoskeletal: Negative for arthralgias, back pain, myalgias and neck pain.  Skin: Positive for rash. Negative for color change.  Neurological: Negative.  Negative for weakness, light-headedness, numbness and headaches.  Hematological: Negative for adenopathy. Does not bruise/bleed easily.  Psychiatric/Behavioral: Negative.     Objective:  BP (!)  178/82 (BP Location: Left Arm, Patient Position: Sitting, Cuff Size: Normal)   Pulse 68   Temp 98.2 F (36.8 C) (Oral)   Resp 20   Ht 5\' 4"  (1.626 m)   Wt 125 lb (56.7 kg)   SpO2 98%   BMI 21.46 kg/m   BP Readings from Last 3 Encounters:  02/15/20 (!) 178/82  10/02/19 140/80  08/08/19 (!) 142/78    Wt Readings from Last 3 Encounters:  02/15/20 125 lb (56.7 kg)  10/02/19 125 lb (56.7 kg)  08/08/19 127 lb 4 oz (57.7 kg)    Physical Exam Constitutional:      Appearance: Normal appearance.  HENT:     Nose: Nose normal.     Mouth/Throat:     Mouth: Mucous membranes are moist.  Eyes:     General: No scleral icterus.    Conjunctiva/sclera: Conjunctivae normal.  Cardiovascular:     Rate and Rhythm: Normal rate and regular rhythm.     Pulses:          Carotid pulses are 1+ on the right side and 1+ on the left side.      Radial pulses are 1+ on the right side and 1+ on the left side.       Femoral pulses are 1+ on the right side and 1+ on the left side with bruit.      Popliteal pulses are 0 on the right side.       Dorsalis pedis pulses are 1+ on the right side and 1+ on the left side.       Posterior tibial pulses are 1+ on the right side and 1+ on the left side.     Heart sounds: No murmur.     Comments: EKG - NSR No LVH  No ST/T wave abnormalities No Q waves Pulmonary:     Effort: Pulmonary effort is normal.     Breath sounds: No wheezing, rhonchi or rales.  Chest:    Abdominal:     General: Abdomen is flat.     Palpations: There is no mass.     Tenderness: There is no abdominal tenderness. There is no guarding.  Musculoskeletal:     Cervical back: Neck supple.     Right lower leg: No edema.     Left lower leg: No edema.  Lymphadenopathy:     Cervical: No cervical adenopathy.  Skin:    General: Skin is warm and dry.     Findings: No rash.  Neurological:     General: No focal deficit present.     Mental Status: She is alert.  Psychiatric:        Mood  and Affect: Mood normal.        Behavior: Behavior normal.     Lab Results  Component Value Date   WBC 5.6 08/08/2019   HGB 10.7 (L) 08/08/2019   HCT 32.3 (L) 08/08/2019   PLT 250.0 08/08/2019   GLUCOSE 108 (H) 08/08/2019   CHOL 190 01/19/2019   TRIG 174.0 (H) 08/08/2019   HDL 46.70 01/19/2019   LDLDIRECT 90.0 01/19/2019  ALT 15 02/13/2019   AST 23 02/13/2019   NA 141 08/08/2019   K 3.8 08/08/2019   CL 104 08/08/2019   CREATININE 1.43 (H) 08/08/2019   BUN 27 (H) 08/08/2019   CO2 27 08/08/2019   TSH 7.65 (H) 08/08/2019   INR 1.17 08/17/2012   HGBA1C 5.8 08/08/2019    No results found.  Assessment & Plan:   Cortlynn was seen today for anemia and hypertension.  Diagnoses and all orders for this visit:  Essential hypertension- Her BP is not adequately well controlled. Will increase the dose of the ARB/CCB/HCTX combination. -     Urinalysis, Routine w reflex microscopic -     TSH -     Basic metabolic panel -     Olmesartan-amLODIPine-HCTZ 20-5-12.5 MG TABS; Take 1 tablet by mouth daily. -     atenolol (TENORMIN) 25 MG tablet; TAKE 1 TABLET BY MOUTH TWICE A DAY  CRI (chronic renal insufficiency), stage 4 (severe) (HCC) - Will try to get better BP control. Will monitor her lytes and renal function. -     Urinalysis, Routine w reflex microscopic -     Basic metabolic panel  Dietary folate deficiency anemia -     CBC with Differential/Platelet -     Vitamin B12 -     Folate -     folic acid (FOLVITE) 1 MG tablet; Take 1 tablet (1 mg total) by mouth daily.  Thiamine deficiency -     CBC with Differential/Platelet -     Vitamin B1  TSH elevation  Dysphagia, pharyngoesophageal phase -     famotidine (PEPCID) 40 MG tablet; Take 1 tablet (40 mg total) by mouth daily.  Gastroesophageal reflux disease without esophagitis -     famotidine (PEPCID) 40 MG tablet; Take 1 tablet (40 mg total) by mouth daily.  Diarrhea, unspecified type -     diphenoxylate-atropine  (LOMOTIL) 2.5-0.025 MG tablet; Take 1 tablet by mouth 4 (four) times daily as needed for diarrhea or loose stools. -     Ambulatory referral to Gastroenterology  Hyperlipidemia LDL goal <130 -     rosuvastatin (CRESTOR) 20 MG tablet; Take 1 tablet (20 mg total) by mouth daily. -     Lipid panel  Breast cancer screening by mammogram -     MM DIGITAL SCREENING BILATERAL; Future  Senile osteoporosis -     denosumab (PROLIA) injection 60 mg  PAD (peripheral artery disease) ([place])- She denies claudication and pulses are palpable. -     cilostazol (PLETAL) 100 MG tablet; Take 1 tablet (100 mg total) by mouth 2 (two) times daily. -     Lipid panel  Tinea corporis -     Ciclopirox 0.77 % gel; Apply 1 Act topically 2 (two) times daily.   I have discontinued Harini D. Askari's levocetirizine, sulfamethoxazole-trimethoprim, and montelukast. I have also changed her rosuvastatin, Olmesartan-amLODIPine-HCTZ, and cilostazol. Additionally, I am having her start on Ciclopirox. Lastly, I am having her maintain her thiamine, escitalopram, D3-50, Vitamin D (Ergocalciferol), fluticasone, LORazepam, famotidine, folic acid, diphenoxylate-atropine, and atenolol. We administered denosumab.  Meds ordered this encounter  Medications  . rosuvastatin (CRESTOR) 20 MG tablet    Sig: Take 1 tablet (20 mg total) by mouth daily.    Dispense:  90 tablet    Refill:  1  . Olmesartan-amLODIPine-HCTZ 20-5-12.5 MG TABS    Sig: Take 1 tablet by mouth daily.    Dispense:  90 tablet    Refill:  1  .  famotidine (PEPCID) 40 MG tablet    Sig: Take 1 tablet (40 mg total) by mouth daily.    Dispense:  90 tablet    Refill:  1  . folic acid (FOLVITE) 1 MG tablet    Sig: Take 1 tablet (1 mg total) by mouth daily.    Dispense:  90 tablet    Refill:  1  . diphenoxylate-atropine (LOMOTIL) 2.5-0.025 MG tablet    Sig: Take 1 tablet by mouth 4 (four) times daily as needed for diarrhea or loose stools.    Dispense:  75 tablet     Refill:  1  . cilostazol (PLETAL) 100 MG tablet    Sig: Take 1 tablet (100 mg total) by mouth 2 (two) times daily.    Dispense:  180 tablet    Refill:  1  . denosumab (PROLIA) injection 60 mg  . atenolol (TENORMIN) 25 MG tablet    Sig: TAKE 1 TABLET BY MOUTH TWICE A DAY    Dispense:  180 tablet    Refill:  1  . Ciclopirox 0.77 % gel    Sig: Apply 1 Act topically 2 (two) times daily.    Dispense:  100 g    Refill:  1     Follow-up: Return in about 3 months (around 05/17/2020).  Scarlette Calico, MD

## 2020-02-21 ENCOUNTER — Other Ambulatory Visit: Payer: Self-pay | Admitting: Internal Medicine

## 2020-02-21 DIAGNOSIS — L2084 Intrinsic (allergic) eczema: Secondary | ICD-10-CM

## 2020-02-22 ENCOUNTER — Other Ambulatory Visit: Payer: Self-pay

## 2020-02-22 ENCOUNTER — Other Ambulatory Visit (INDEPENDENT_AMBULATORY_CARE_PROVIDER_SITE_OTHER): Payer: Medicare Other

## 2020-02-22 ENCOUNTER — Telehealth: Payer: Self-pay | Admitting: Internal Medicine

## 2020-02-22 DIAGNOSIS — E785 Hyperlipidemia, unspecified: Secondary | ICD-10-CM

## 2020-02-22 DIAGNOSIS — F411 Generalized anxiety disorder: Secondary | ICD-10-CM

## 2020-02-22 DIAGNOSIS — I10 Essential (primary) hypertension: Secondary | ICD-10-CM | POA: Diagnosis not present

## 2020-02-22 DIAGNOSIS — R7989 Other specified abnormal findings of blood chemistry: Secondary | ICD-10-CM | POA: Diagnosis not present

## 2020-02-22 DIAGNOSIS — D520 Dietary folate deficiency anemia: Secondary | ICD-10-CM | POA: Diagnosis not present

## 2020-02-22 DIAGNOSIS — E519 Thiamine deficiency, unspecified: Secondary | ICD-10-CM | POA: Diagnosis not present

## 2020-02-22 DIAGNOSIS — J398 Other specified diseases of upper respiratory tract: Secondary | ICD-10-CM | POA: Diagnosis not present

## 2020-02-22 LAB — BASIC METABOLIC PANEL
BUN: 35 mg/dL — ABNORMAL HIGH (ref 6–23)
CO2: 24 mEq/L (ref 19–32)
Calcium: 10.1 mg/dL (ref 8.4–10.5)
Chloride: 107 mEq/L (ref 96–112)
Creatinine, Ser: 1.71 mg/dL — ABNORMAL HIGH (ref 0.40–1.20)
GFR: 29.17 mL/min — ABNORMAL LOW (ref >60.00)
Glucose, Bld: 93 mg/dL (ref 70–99)
Potassium: 4.1 mEq/L (ref 3.5–5.1)
Sodium: 139 mEq/L (ref 135–145)

## 2020-02-22 LAB — URINALYSIS, ROUTINE W REFLEX MICROSCOPIC
Bilirubin Urine: NEGATIVE
Ketones, ur: NEGATIVE
Leukocytes,Ua: NEGATIVE
Nitrite: NEGATIVE
Specific Gravity, Urine: 1.025 (ref 1.000–1.030)
Total Protein, Urine: >=300 — AB
Urine Glucose: NEGATIVE
Urobilinogen, UA: 0.2 (ref 0.0–1.0)
pH: 5.5 (ref 5.0–8.0)

## 2020-02-22 LAB — VITAMIN B12: Vitamin B-12: 441 pg/mL (ref 211–911)

## 2020-02-22 LAB — LIPID PANEL
Cholesterol: 191 mg/dL (ref 0–200)
HDL: 53.3 mg/dL (ref >39.00)
NonHDL: 138.13
Total CHOL/HDL Ratio: 4
Triglycerides: 221 mg/dL — ABNORMAL HIGH (ref 0.0–149.0)
VLDL: 44.2 mg/dL — ABNORMAL HIGH (ref 0.0–40.0)

## 2020-02-22 LAB — CBC WITH DIFFERENTIAL/PLATELET
Basophils Absolute: 0 10*3/uL (ref 0.0–0.1)
Basophils Relative: 0.5 % (ref 0.0–3.0)
Eosinophils Absolute: 0.2 10*3/uL (ref 0.0–0.7)
Eosinophils Relative: 2 % (ref 0.0–5.0)
HCT: 32.7 % — ABNORMAL LOW (ref 36.0–46.0)
Hemoglobin: 11.1 g/dL — ABNORMAL LOW (ref 12.0–15.0)
Lymphocytes Relative: 20.5 % (ref 12.0–46.0)
Lymphs Abs: 1.7 10*3/uL (ref 0.7–4.0)
MCHC: 33.9 g/dL (ref 30.0–36.0)
MCV: 83.1 fl (ref 78.0–100.0)
Monocytes Absolute: 0.8 10*3/uL (ref 0.1–1.0)
Monocytes Relative: 9.3 % (ref 3.0–12.0)
Neutro Abs: 5.6 10*3/uL (ref 1.4–7.7)
Neutrophils Relative %: 67.7 % (ref 43.0–77.0)
Platelets: 237 10*3/uL (ref 150.0–400.0)
RBC: 3.93 Mil/uL (ref 3.87–5.11)
RDW: 14 % (ref 11.5–15.5)
WBC: 8.3 10*3/uL (ref 4.0–10.5)

## 2020-02-22 LAB — TSH: TSH: 11.3 u[IU]/mL — ABNORMAL HIGH (ref 0.35–4.50)

## 2020-02-22 LAB — FOLATE: Folate: >23.7 ng/mL (ref >5.9)

## 2020-02-22 LAB — LDL CHOLESTEROL, DIRECT: Direct LDL: 88 mg/dL

## 2020-02-22 MED ORDER — OLMESARTAN-AMLODIPINE-HCTZ 20-5-12.5 MG PO TABS: 1.0000 | ORAL_TABLET | Freq: Every day | ORAL | 1 refills | Status: DC

## 2020-02-22 MED ORDER — ESCITALOPRAM OXALATE 20 MG PO TABS: 20.0000 mg | ORAL_TABLET | Freq: Every day | ORAL | 1 refills | Status: DC

## 2020-02-22 NOTE — Telephone Encounter (Signed)
Erx has been sent. 

## 2020-02-22 NOTE — Telephone Encounter (Signed)
1.Medication Requested:  escitalopram (LEXAPRO) 20 MG tablet  Olmesartan-amLODIPine-HCTZ 20-5-12.5 MG TABS  2. Pharmacy (Name, [address], [place], [place]  3. On Med List: Yes   4. Last Visit with PCP: 3.4.2021  5. Next visit date with PCP: no appt is made at this time      Agent: Please be advised that RX refills may take up to 3 business days. We ask that you follow-up with your pharmacy.

## 2020-02-22 NOTE — Telephone Encounter (Signed)
Pt's daughter says that the medicine for allergy's and a prescription for her skin was not sent to her pharmacy  [facility] on [address]

## 2020-02-25 ENCOUNTER — Other Ambulatory Visit: Payer: Self-pay | Admitting: Internal Medicine

## 2020-02-25 DIAGNOSIS — E039 Hypothyroidism, unspecified: Secondary | ICD-10-CM | POA: Insufficient documentation

## 2020-02-25 DIAGNOSIS — E038 Other specified hypothyroidism: Secondary | ICD-10-CM

## 2020-02-25 MED ORDER — LEVOTHYROXINE SODIUM 25 MCG PO TABS: 25.0000 ug | ORAL_TABLET | Freq: Every day | ORAL | 0 refills | Status: DC

## 2020-02-26 ENCOUNTER — Encounter: Payer: Self-pay | Admitting: Internal Medicine

## 2020-02-26 ENCOUNTER — Other Ambulatory Visit: Payer: Self-pay | Admitting: Internal Medicine

## 2020-02-26 DIAGNOSIS — N184 Chronic kidney disease, stage 4 (severe): Secondary | ICD-10-CM

## 2020-02-26 LAB — VITAMIN B1: Vitamin B1 (Thiamine): 39 nmol/L — ABNORMAL HIGH (ref 8–30)

## 2020-02-27 ENCOUNTER — Telehealth: Payer: Self-pay | Admitting: Internal Medicine

## 2020-02-27 ENCOUNTER — Other Ambulatory Visit: Payer: Self-pay | Admitting: Internal Medicine

## 2020-02-27 ENCOUNTER — Other Ambulatory Visit: Payer: Self-pay

## 2020-02-27 DIAGNOSIS — E038 Other specified hypothyroidism: Secondary | ICD-10-CM

## 2020-02-27 DIAGNOSIS — B354 Tinea corporis: Secondary | ICD-10-CM

## 2020-02-27 MED ORDER — FLUCONAZOLE 150 MG PO TABS: 150.0000 mg | ORAL_TABLET | ORAL | 1 refills | Status: DC

## 2020-02-27 MED ORDER — LEVOTHYROXINE SODIUM 25 MCG PO TABS: 25.0000 ug | ORAL_TABLET | Freq: Every day | ORAL | 0 refills | Status: DC

## 2020-02-27 NOTE — Telephone Encounter (Signed)
Called pharmacist and informed it was okay per PCP due to the low dose of fluconazole and that it was once a week for 4 weeks.    LVM for [name] informing rx for yeast was sent in for patient.

## 2020-02-27 NOTE — Telephone Encounter (Signed)
New Message:   [facility] is calling and states they see an reaction between these two medications Fluconazole and Cilostazol. Pharmacist states he also sent a fax over to [place] to. Please advise.

## 2020-03-04 ENCOUNTER — Other Ambulatory Visit: Payer: Self-pay | Admitting: Internal Medicine

## 2020-03-04 DIAGNOSIS — R1314 Dysphagia, pharyngoesophageal phase: Secondary | ICD-10-CM

## 2020-03-04 DIAGNOSIS — I10 Essential (primary) hypertension: Secondary | ICD-10-CM

## 2020-03-04 DIAGNOSIS — K219 Gastro-esophageal reflux disease without esophagitis: Secondary | ICD-10-CM

## 2020-03-04 DIAGNOSIS — E785 Hyperlipidemia, unspecified: Secondary | ICD-10-CM

## 2020-03-04 DIAGNOSIS — I739 Peripheral vascular disease, unspecified: Secondary | ICD-10-CM

## 2020-03-13 ENCOUNTER — Other Ambulatory Visit: Payer: Self-pay | Admitting: Internal Medicine

## 2020-03-13 DIAGNOSIS — E559 Vitamin D deficiency, unspecified: Secondary | ICD-10-CM

## 2020-03-19 DIAGNOSIS — Z1231 Encounter for screening mammogram for malignant neoplasm of breast: Secondary | ICD-10-CM | POA: Diagnosis not present

## 2020-03-21 ENCOUNTER — Encounter: Payer: Self-pay | Admitting: Internal Medicine

## 2020-03-26 DIAGNOSIS — Z01812 Encounter for preprocedural laboratory examination: Secondary | ICD-10-CM | POA: Diagnosis not present

## 2020-03-26 DIAGNOSIS — R1310 Dysphagia, unspecified: Secondary | ICD-10-CM | POA: Diagnosis not present

## 2020-03-26 DIAGNOSIS — J398 Other specified diseases of upper respiratory tract: Secondary | ICD-10-CM | POA: Diagnosis not present

## 2020-03-26 DIAGNOSIS — Z9002 Acquired absence of larynx: Secondary | ICD-10-CM | POA: Diagnosis not present

## 2020-03-26 DIAGNOSIS — Z20822 Contact with and (suspected) exposure to covid-19: Secondary | ICD-10-CM | POA: Diagnosis not present

## 2020-03-27 ENCOUNTER — Encounter: Payer: Self-pay | Admitting: Internal Medicine

## 2020-04-02 DIAGNOSIS — Z87891 Personal history of nicotine dependence: Secondary | ICD-10-CM | POA: Diagnosis not present

## 2020-04-02 DIAGNOSIS — R491 Aphonia: Secondary | ICD-10-CM | POA: Diagnosis not present

## 2020-04-02 DIAGNOSIS — Z8521 Personal history of malignant neoplasm of larynx: Secondary | ICD-10-CM | POA: Diagnosis not present

## 2020-04-02 DIAGNOSIS — Z9002 Acquired absence of larynx: Secondary | ICD-10-CM | POA: Diagnosis not present

## 2020-04-02 DIAGNOSIS — R498 Other voice and resonance disorders: Secondary | ICD-10-CM | POA: Diagnosis not present

## 2020-04-02 DIAGNOSIS — R1310 Dysphagia, unspecified: Secondary | ICD-10-CM | POA: Diagnosis not present

## 2020-04-02 DIAGNOSIS — R1314 Dysphagia, pharyngoesophageal phase: Secondary | ICD-10-CM | POA: Diagnosis not present

## 2020-04-02 DIAGNOSIS — J392 Other diseases of pharynx: Secondary | ICD-10-CM | POA: Diagnosis not present

## 2020-04-02 DIAGNOSIS — L905 Scar conditions and fibrosis of skin: Secondary | ICD-10-CM | POA: Diagnosis not present

## 2020-04-02 DIAGNOSIS — I10 Essential (primary) hypertension: Secondary | ICD-10-CM | POA: Diagnosis not present

## 2020-05-30 ENCOUNTER — Other Ambulatory Visit: Payer: Self-pay | Admitting: Internal Medicine

## 2020-05-30 DIAGNOSIS — E559 Vitamin D deficiency, unspecified: Secondary | ICD-10-CM

## 2020-06-03 ENCOUNTER — Other Ambulatory Visit: Payer: Self-pay | Admitting: Internal Medicine

## 2020-06-03 DIAGNOSIS — F411 Generalized anxiety disorder: Secondary | ICD-10-CM

## 2020-06-10 ENCOUNTER — Ambulatory Visit (HOSPITAL_COMMUNITY)
Admission: EM | Admit: 2020-06-10 | Discharge: 2020-06-10 | Disposition: A | Discharge status: Home / Self Care | Payer: Medicare Other | Attending: Family Medicine | Admitting: Family Medicine

## 2020-06-10 ENCOUNTER — Encounter (HOSPITAL_COMMUNITY): Payer: Self-pay

## 2020-06-10 DIAGNOSIS — J22 Unspecified acute lower respiratory infection: Secondary | ICD-10-CM | POA: Diagnosis not present

## 2020-06-10 MED ORDER — AMOXICILLIN 875 MG PO TABS
875.0000 mg | ORAL_TABLET | Freq: Two times a day (BID) | ORAL | 0 refills | Status: DC
Start: 2020-06-10 — End: 2020-09-19

## 2020-06-10 MED ORDER — FLUTICASONE PROPIONATE 50 MCG/ACT NA SUSP: NASAL | 0 refills | Status: DC

## 2020-06-10 NOTE — Discharge Instructions (Signed)
Make sure that you drink plenty of water and fluids.  This will help keep your mucus from becoming thick and hard to remove Use the Flonase daily.  This will help with nasal congestion and will the postnasal drip and drainage Take antibiotic 2 times a day Call your doctor if not improving by the end of the week

## 2020-06-10 NOTE — ED Triage Notes (Signed)
Pt presents with cough, headache and nasal congestion x 5 days. Denies fever, chills, body aches. [name] was diagnosed with a viral illness 1 week ago, states had the same symptoms.

## 2020-06-10 NOTE — ED Provider Notes (Signed)
[place]    CSN: [id number] Arrival date & time: 06/10/20  1422      History   Chief Complaint Chief Complaint  Patient presents with  . Cough  . Headache  . Nasal Congestion    HPI [name] is a 74 y.o. female.   HPI  74 year old Hispanic female who has a tracheostomy, permanent, after having surgery and radiation for a oral squamous cell carcinoma due to cigarette smoking.  She is a 50-year pack history of smoking but denies any lung disease or emphysema.  She is here because she has an upper respiratory infection.  Increased mucus production, runny nose, postnasal drip, and the postnasal drip is clogging her trachea causing her to cough.  She does not feel like she has any infection in her chest..  She has not had any fever or chills.  She is feeling more tired.  She states that when she gets upper respiratory infections her primary care doctor does give her antibiotics because of her compromised respiratory status.  She has been around her grandsons.  They both were diagnosed as viral infections.  She has not had Covid vaccinations.  She does not wish to be tested for Covid today.  Denies headache, body aches, fever, shortness of breath  Past Medical History:  Diagnosis Date  . Anginal pain ([place])    ?  [name] Anxiety    takes Ativan prn  . Blood transfusion without reported diagnosis 09/15/12   2 units Prbc's  . Broken ribs   . Chronic back pain   . Constipation    related to pain meds  . COPD (chronic obstructive pulmonary disease) ([place]) 08/10/2012   denies  . Depression   . Gastrostomy in place [facility])    removed  . GERD (gastroesophageal reflux disease)    takes Zantac daily  . Headache(784.0)   . Hiatal hernia 08/10/2012  . History of radiation therapy 10/17/12-11/25/12   supraglottic larynx,high risk neck tumor bed 5880 cGy/28 sessions, high risk lymph node tumor bed 5600 cGy/20 sessions, mod risk lymph node tumor bed 5040 cGy/20 sessions  .  Hypercholesteremia    takes Pravastatin daily  . Hypertension    takes Tribenzor and Atenolol daily  . Insomnia    takes Amitriptyline daily  . Nausea    takes Zofran prn  . Pneumonia   . 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 Shortness of breath dyspnea   . Stroke [facility]) 2011   denies. no residual  . Uterine cancer [facility])     Patient Active Problem List   Diagnosis Date Noted  . Other specified hypothyroidism 02/25/2020  . Senile osteoporosis 02/15/2020  . PAD (peripheral artery disease) ([place]) 02/15/2020  . Tinea corporis 02/15/2020  . Gastroesophageal reflux disease without esophagitis 09/09/2019  . Screen for colon cancer 01/19/2019  . Osteopenia 01/19/2019  . Breast cancer screening by mammogram 01/19/2019  . CRI (chronic renal insufficiency), stage 4 (severe) ([place]) 01/19/2019  . Thiamine deficiency 01/02/2018  . TSH elevation 12/29/2017  . Pure hyperglyceridemia 12/29/2017  . Prediabetes 12/28/2017  . Hyperlipidemia LDL goal <130 12/28/2017  . COPD (chronic obstructive pulmonary disease) with chronic bronchitis ([place]) 12/28/2017  . Dietary folate deficiency anemia 12/28/2017  . Moderate episode of recurrent major depressive disorder ([place]) 10/22/2017  . Dysphagia, pharyngoesophageal phase 05/22/2014  . History of radiation therapy   . Chronic pain syndrome 09/03/2012  . Tobacco abuse 08/10/2012  . COPD (chronic obstructive pulmonary disease) (  [place]) 08/10/2012  . 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 08/08/2012    Class: Chronic  . Hypertension   . GAD (generalized anxiety disorder)     Past Surgical History:  Procedure Laterality Date  . ABDOMINAL SURGERY     r/t uterine carcinoma  . APPENDECTOMY    . DIRECT LARYNGOSCOPY N/A 05/22/2014   Procedure: DIRECT LARYNGOSCOPY WITH ESOPHAGEAL DILATION;  Surgeon: Jerrell Belfast, MD;  Location: [place];  Service: ENT;  Laterality: N/A;  . ESOPHAGOSCOPY WITH DILITATION N/A 05/29/2015    Procedure: ESOPHAGOSCOPY WITH ESOPHAGEAL DILITATION;  Surgeon: Jerrell Belfast, MD;  Location: [facility] OR;  Service: ENT;  Laterality: N/A;  . Gastrostomy Tube removed   2013  . GASTROSTOMY W/ FEEDING TUBE  13  . GASTROTOMY    . HERNIA REPAIR     child  . LARYNGETOMY  08/31/2012   Procedure: LARYNGECTOMY;  Surgeon: Jerrell Belfast, MD;  Location: [place];  Service: ENT;  Laterality: N/A;  . LARYNGOSCOPY  08/10/2012   Procedure: LARYNGOSCOPY;  Surgeon: Jerrell Belfast, MD;  Location: [facility];  Service: ENT;  Laterality: N/A;  with biopsy  . RADICAL NECK DISSECTION  08/31/2012   Procedure: RADICAL NECK DISSECTION;  Surgeon: Jerrell Belfast, MD;  Location: [place];  Service: ENT;  Laterality: Bilateral;  . TRACHEAL ESOPHOGEAL PUNCTURE WITH REPAIR STOMA N/A 09/08/2013   Procedure: TRACHEAL ESOPHOGEAL PUNCTURE WITH PLACEMENT OF  PROVOX PROSTHESIS ;  Surgeon: Jerrell Belfast, MD;  Location: [place];  Service: ENT;  Laterality: N/A;  . TRACHEOSTOMY TUBE PLACEMENT  08/10/2012   Procedure: TRACHEOSTOMY;  Surgeon: Jerrell Belfast, MD;  Location: [facility];  Service: ENT;  Laterality: N/A;    OB History   No obstetric history on file.      Home Medications    Prior to Admission medications   Medication Sig Start Date End Date Taking? Authorizing Provider  amoxicillin (AMOXIL) 875 MG tablet Take 1 tablet (875 mg total) by mouth 2 (two) times daily. 06/10/20   Raylene Everts, MD  atenolol (TENORMIN) 25 MG tablet TAKE 1 TABLET BY MOUTH TWICE A DAY 03/04/20   Janith [place], MD  Ciclopirox 0.77 % gel Apply 1 Act topically 2 (two) times daily. 02/15/20   Janith [place], MD  cilostazol (PLETAL) 100 MG tablet TAKE 1 TABLET BY MOUTH TWICE A DAY 03/04/20   Janith [place], MD  D3-50 1.25 MG (50000 UT) capsule TAKE 1 CAPSULE BY MOUTH ONE TIME PER WEEK 09/05/19   Janith [place], MD  diphenoxylate-atropine (LOMOTIL) 2.5-0.025 MG tablet Take 1 tablet by mouth 4 (four) times daily as needed for diarrhea or loose stools. 02/15/20    Janith [place], MD  escitalopram (LEXAPRO) 20 MG tablet Take 1 tablet (20 mg total) by mouth daily. 02/22/20   Janith [place], MD  famotidine (PEPCID) 40 MG tablet TAKE 1 TABLET BY MOUTH EVERY DAY 03/04/20   Janith [place], MD  fluticasone [facility]) 50 MCG/ACT nasal spray SPRAY 2 SPRAYS INTO EACH NOSTRIL EVERY DAY 06/10/20   Raylene Everts, MD  folic acid (FOLVITE) 1 MG tablet Take 1 tablet (1 mg total) by mouth daily. 02/15/20   Janith [place], MD  levothyroxine (SYNTHROID) 25 MCG tablet Take 1 tablet (25 mcg total) by mouth daily before breakfast. 02/27/20   Janith [place], MD  LORazepam (ATIVAN) 1 MG tablet TAKE 1 TABLET BY MOUTH EVERY DAY 06/03/20   Janith [place], MD  Olmesartan-amLODIPine-HCTZ 20-5-12.5 MG TABS Take  1 tablet by mouth daily. 02/22/20   Janith [place], MD  rosuvastatin (CRESTOR) 20 MG tablet TAKE 1 TABLET BY MOUTH EVERY DAY 03/04/20   Janith [place], MD  thiamine (VITAMIN B-1) 100 MG tablet Take 1 tablet (100 mg total) by mouth daily. 01/19/19   Janith [place], MD  Vitamin D, Ergocalciferol, (DRISDOL) 1.25 MG (50000 UNIT) CAPS capsule TAKE 1 CAPSULE BY MOUTH ONE TIME PER WEEK 05/30/20   Janith [place], MD  levocetirizine (XYZAL) 5 MG tablet TAKE 1 TABLET(5 MG) BY MOUTH EVERY EVENING 02/21/20 06/10/20  Janith [place], MD  montelukast (SINGULAIR) 10 MG tablet Take 1 tablet (10 mg total) by mouth at bedtime. 07/15/19   Janith [place], MD    Family History History reviewed. No pertinent family history.  Social History Social History   Tobacco Use  . Smoking status: Former Smoker    Packs/day: 0.25    Years: 50.00    Pack years: 12.50    Types: Cigarettes    Quit date: 05/27/2013    Years since quitting: 7.0  . Smokeless tobacco: Never Used  Substance Use Topics  . Alcohol use: No  . Drug use: No     Allergies   Xyzal [levocetirizine dihydrochloride]   Review of Systems Review of Systems  HENT: Positive for congestion, postnasal drip and rhinorrhea.     Respiratory: Positive for cough.   See HPI  Physical Exam Triage Vital Signs ED Triage Vitals  Enc Vitals Group     BP 06/10/20 1514 132/64     Pulse Rate 06/10/20 1514 68     Resp 06/10/20 1514 (!) 28     Temp 06/10/20 1514 98.7 F (37.1 C)     Temp Source 06/10/20 1514 Oral     SpO2 06/10/20 1514 100 %     Weight --      Height --      Head Circumference --      Peak Flow --      Pain Score 06/10/20 1510 5     Pain Loc --      Pain Edu? --      Excl. in [place]? --    No data found.  Updated Vital Signs BP 132/64 (BP Location: Right Arm)   Pulse 68   Temp 98.7 F (37.1 C) (Oral)   Resp (!) 28   SpO2 100%      Physical Exam Constitutional:      General: She is not in acute distress.    Appearance: She is well-developed and normal weight.     Comments: Tracheostomy in place  HENT:     Head: Normocephalic and atraumatic.     Mouth/Throat:     Mouth: Mucous membranes are moist.     Comments: Cannot open mouth more than 2 cm at incisors.  Moist Eyes:     Conjunctiva/sclera: Conjunctivae normal.     Pupils: Pupils are equal, round, and reactive to light.  Cardiovascular:     Rate and Rhythm: Normal rate and regular rhythm.     Heart sounds: Normal heart sounds.  Pulmonary:     Effort: Pulmonary effort is normal. No respiratory distress.     Comments: Referred rhonchi throughout chest.  No rales or wheeze Abdominal:     General: There is no distension.     Palpations: Abdomen is soft.  Musculoskeletal:        General: Normal range of motion.     Cervical  back: Normal range of motion. No tenderness.  Lymphadenopathy:     Cervical: No cervical adenopathy.  Skin:    General: Skin is warm and dry.  Neurological:     Mental Status: She is alert.  Psychiatric:        Mood and Affect: Mood normal.        Behavior: Behavior normal.      UC Treatments / Results  Labs (all labs ordered are listed, but only abnormal results are displayed) Labs Reviewed - No  data to display  EKG   Radiology No results found.  Procedures Procedures (including critical care time)  Medications Ordered in UC Medications - No data to display  Initial Impression / Assessment and Plan / UC Course  I have reviewed the triage vital signs and the nursing notes.  Pertinent labs & imaging results that were available during my care of the patient were reviewed by me and considered in my medical decision making (see chart for details).     We will put patient back on Flonase to control her nasal secretions.  I gave her a bulb syringe for her secretions.  I encouraged her to drink more water to loosen her secretions.  She also is givenPrescription for 7 days of amoxicillin.  Follow-up with her primary care Final Clinical Impressions(s) / UC Diagnoses   Final diagnoses:  Unspecified acute lower respiratory infection     Discharge Instructions     Make sure that you drink plenty of water and fluids.  This will help keep your mucus from becoming thick and hard to remove Use the Flonase daily.  This will help with nasal congestion and will the postnasal drip and drainage Take antibiotic 2 times a day Call your doctor if not improving by the end of the week   ED Prescriptions    Medication Sig Dispense Auth. Provider   fluticasone (FLONASE) 50 MCG/ACT nasal spray SPRAY 2 SPRAYS INTO EACH NOSTRIL EVERY DAY 16 g Raylene Everts, MD   amoxicillin (AMOXIL) 875 MG tablet Take 1 tablet (875 mg total) by mouth 2 (two) times daily. 14 tablet Raylene Everts, MD     PDMP not reviewed this encounter.   Raylene Everts, MD 06/10/20 1655

## 2020-07-19 ENCOUNTER — Other Ambulatory Visit: Payer: Self-pay | Admitting: Internal Medicine

## 2020-07-19 DIAGNOSIS — J301 Allergic rhinitis due to pollen: Secondary | ICD-10-CM

## 2020-08-14 DIAGNOSIS — Z20822 Contact with and (suspected) exposure to covid-19: Secondary | ICD-10-CM | POA: Diagnosis not present

## 2020-08-14 DIAGNOSIS — R1310 Dysphagia, unspecified: Secondary | ICD-10-CM | POA: Diagnosis not present

## 2020-08-14 DIAGNOSIS — Z01812 Encounter for preprocedural laboratory examination: Secondary | ICD-10-CM | POA: Diagnosis not present

## 2020-08-20 DIAGNOSIS — Z87891 Personal history of nicotine dependence: Secondary | ICD-10-CM | POA: Diagnosis not present

## 2020-08-20 DIAGNOSIS — Z8521 Personal history of malignant neoplasm of larynx: Secondary | ICD-10-CM | POA: Diagnosis not present

## 2020-08-20 DIAGNOSIS — J392 Other diseases of pharynx: Secondary | ICD-10-CM | POA: Diagnosis not present

## 2020-08-20 DIAGNOSIS — R1310 Dysphagia, unspecified: Secondary | ICD-10-CM | POA: Diagnosis not present

## 2020-08-20 DIAGNOSIS — R491 Aphonia: Secondary | ICD-10-CM | POA: Diagnosis not present

## 2020-08-25 ENCOUNTER — Other Ambulatory Visit: Payer: Self-pay | Admitting: Internal Medicine

## 2020-08-25 DIAGNOSIS — E038 Other specified hypothyroidism: Secondary | ICD-10-CM

## 2020-08-26 ENCOUNTER — Other Ambulatory Visit: Payer: Self-pay | Admitting: Internal Medicine

## 2020-08-26 DIAGNOSIS — K219 Gastro-esophageal reflux disease without esophagitis: Secondary | ICD-10-CM

## 2020-08-26 DIAGNOSIS — R1314 Dysphagia, pharyngoesophageal phase: Secondary | ICD-10-CM

## 2020-08-26 DIAGNOSIS — I739 Peripheral vascular disease, unspecified: Secondary | ICD-10-CM

## 2020-08-26 DIAGNOSIS — F411 Generalized anxiety disorder: Secondary | ICD-10-CM

## 2020-08-26 DIAGNOSIS — I10 Essential (primary) hypertension: Secondary | ICD-10-CM

## 2020-08-26 DIAGNOSIS — E785 Hyperlipidemia, unspecified: Secondary | ICD-10-CM

## 2020-09-03 DIAGNOSIS — Z23 Encounter for immunization: Secondary | ICD-10-CM | POA: Diagnosis not present

## 2020-09-19 ENCOUNTER — Ambulatory Visit (INDEPENDENT_AMBULATORY_CARE_PROVIDER_SITE_OTHER): Payer: Medicare Other | Admitting: Internal Medicine

## 2020-09-19 ENCOUNTER — Other Ambulatory Visit: Payer: Self-pay

## 2020-09-19 ENCOUNTER — Encounter: Payer: Self-pay | Admitting: Internal Medicine

## 2020-09-19 VITALS — BP 136/82 | HR 66 | Temp 98.2°F | Ht 64.0 in | Wt 127.0 lb

## 2020-09-19 DIAGNOSIS — E038 Other specified hypothyroidism: Secondary | ICD-10-CM | POA: Diagnosis not present

## 2020-09-19 DIAGNOSIS — K219 Gastro-esophageal reflux disease without esophagitis: Secondary | ICD-10-CM

## 2020-09-19 DIAGNOSIS — N184 Chronic kidney disease, stage 4 (severe): Secondary | ICD-10-CM | POA: Diagnosis not present

## 2020-09-19 DIAGNOSIS — N3001 Acute cystitis with hematuria: Secondary | ICD-10-CM | POA: Diagnosis not present

## 2020-09-19 DIAGNOSIS — I739 Peripheral vascular disease, unspecified: Secondary | ICD-10-CM | POA: Diagnosis not present

## 2020-09-19 DIAGNOSIS — E785 Hyperlipidemia, unspecified: Secondary | ICD-10-CM

## 2020-09-19 DIAGNOSIS — R197 Diarrhea, unspecified: Secondary | ICD-10-CM | POA: Diagnosis not present

## 2020-09-19 DIAGNOSIS — I10 Essential (primary) hypertension: Secondary | ICD-10-CM

## 2020-09-19 DIAGNOSIS — F411 Generalized anxiety disorder: Secondary | ICD-10-CM | POA: Diagnosis not present

## 2020-09-19 DIAGNOSIS — E519 Thiamine deficiency, unspecified: Secondary | ICD-10-CM | POA: Diagnosis not present

## 2020-09-19 DIAGNOSIS — D520 Dietary folate deficiency anemia: Secondary | ICD-10-CM | POA: Diagnosis not present

## 2020-09-19 DIAGNOSIS — R1314 Dysphagia, pharyngoesophageal phase: Secondary | ICD-10-CM

## 2020-09-19 LAB — TSH: TSH: 6.82 u[IU]/mL — ABNORMAL HIGH (ref 0.35–4.50)

## 2020-09-19 LAB — URINALYSIS, ROUTINE W REFLEX MICROSCOPIC
Bilirubin Urine: NEGATIVE
Ketones, ur: NEGATIVE
Nitrite: NEGATIVE
Specific Gravity, Urine: 1.025 (ref 1.000–1.030)
Total Protein, Urine: >=300 — AB
Urine Glucose: NEGATIVE
Urobilinogen, UA: 0.2 (ref 0.0–1.0)
pH: 6 (ref 5.0–8.0)

## 2020-09-19 LAB — BASIC METABOLIC PANEL
BUN: 45 mg/dL — ABNORMAL HIGH (ref 6–23)
CO2: 25 mEq/L (ref 19–32)
Calcium: 10 mg/dL (ref 8.4–10.5)
Chloride: 107 mEq/L (ref 96–112)
Creatinine, Ser: 1.81 mg/dL — ABNORMAL HIGH (ref 0.40–1.20)
GFR: 26.92 mL/min — ABNORMAL LOW (ref >60.00)
Glucose, Bld: 93 mg/dL (ref 70–99)
Potassium: 4.5 mEq/L (ref 3.5–5.1)
Sodium: 141 mEq/L (ref 135–145)

## 2020-09-19 LAB — FOLATE: Folate: 4.3 ng/mL — ABNORMAL LOW (ref >5.9)

## 2020-09-19 MED ORDER — ATENOLOL 25 MG PO TABS: 25.0000 mg | ORAL_TABLET | Freq: Two times a day (BID) | ORAL | 1 refills | Status: DC

## 2020-09-19 MED ORDER — DIPHENOXYLATE-ATROPINE 2.5-0.025 MG PO TABS: 1.0000 | ORAL_TABLET | Freq: Four times a day (QID) | ORAL | 1 refills | Status: DC | PRN

## 2020-09-19 MED ORDER — NITROFURANTOIN MONOHYD MACRO 100 MG PO CAPS: 100.0000 mg | ORAL_CAPSULE | Freq: Two times a day (BID) | ORAL | 0 refills | Status: AC

## 2020-09-19 MED ORDER — ROSUVASTATIN CALCIUM 20 MG PO TABS: 20.0000 mg | ORAL_TABLET | Freq: Every day | ORAL | 1 refills | Status: DC

## 2020-09-19 MED ORDER — CILOSTAZOL 100 MG PO TABS: 100.0000 mg | ORAL_TABLET | Freq: Two times a day (BID) | ORAL | 1 refills | Status: DC

## 2020-09-19 MED ORDER — FOLIC ACID 1 MG PO TABS: 1.0000 mg | ORAL_TABLET | Freq: Every day | ORAL | 1 refills | Status: DC

## 2020-09-19 MED ORDER — OLMESARTAN-AMLODIPINE-HCTZ 20-5-12.5 MG PO TABS: 1.0000 | ORAL_TABLET | Freq: Every day | ORAL | 1 refills | Status: DC

## 2020-09-19 MED ORDER — FAMOTIDINE 40 MG PO TABS: 40.0000 mg | ORAL_TABLET | Freq: Every day | ORAL | 1 refills | Status: DC

## 2020-09-19 MED ORDER — ESCITALOPRAM OXALATE 20 MG PO TABS: 20.0000 mg | ORAL_TABLET | Freq: Every day | ORAL | 1 refills | Status: DC

## 2020-09-19 MED ORDER — LORAZEPAM 1 MG PO TABS: 1.0000 mg | ORAL_TABLET | Freq: Every day | ORAL | 2 refills | Status: DC

## 2020-09-19 MED ORDER — VITAMIN B-1 100 MG PO TABS: 100.0000 mg | ORAL_TABLET | Freq: Every day | ORAL | 1 refills | Status: DC

## 2020-09-19 NOTE — Patient Instructions (Signed)

## 2020-09-19 NOTE — Progress Notes (Signed)
Subjective:  Patient ID: [name], female    DOB: Apr 19, 1946  Age: 74 y.o. MRN: [id number]  CC: Anemia, Hypothyroidism, Urinary Tract Infection, and Hypertension  This visit occurred during the SARS-CoV-2 public health emergency.  Safety protocols were in place, including screening questions prior to the visit, additional usage of staff PPE, and extensive cleaning of exam room while observing appropriate contact time as indicated for disinfecting solutions.    HPI [name] presents for f/up - She complains of a 10-day history of dysuria, hematuria, and bladder pain.  She denies nausea, vomiting, fever, or chills.  Outpatient Medications Prior to Visit  Medication Sig Dispense Refill  . Ciclopirox 0.77 % gel Apply 1 Act topically 2 (two) times daily. 100 g 1  . D3-50 1.25 MG (50000 UT) capsule TAKE 1 CAPSULE BY MOUTH ONE TIME PER WEEK 12 capsule 0  . fluticasone (FLONASE) 50 MCG/ACT nasal spray SPRAY 2 SPRAYS INTO EACH NOSTRIL EVERY DAY 16 g 0  . levothyroxine (SYNTHROID) 25 MCG tablet Take 1 tablet (25 mcg total) by mouth daily before breakfast. 90 tablet 0  . Vitamin D, Ergocalciferol, (DRISDOL) 1.25 MG (50000 UNIT) CAPS capsule TAKE 1 CAPSULE BY MOUTH ONE TIME PER WEEK 12 capsule 0  . amoxicillin (AMOXIL) 875 MG tablet Take 1 tablet (875 mg total) by mouth 2 (two) times daily. 14 tablet 0  . atenolol (TENORMIN) 25 MG tablet TAKE 1 TABLET BY MOUTH TWICE A DAY 180 tablet 1  . cilostazol (PLETAL) 100 MG tablet TAKE 1 TABLET BY MOUTH TWICE A DAY 180 tablet 1  . diphenoxylate-atropine (LOMOTIL) 2.5-0.025 MG tablet Take 1 tablet by mouth 4 (four) times daily as needed for diarrhea or loose stools. 75 tablet 1  . escitalopram (LEXAPRO) 20 MG tablet Take 1 tablet (20 mg total) by mouth daily. 90 tablet 1  . famotidine (PEPCID) 40 MG tablet TAKE 1 TABLET BY MOUTH EVERY DAY 90 tablet 1  . folic acid (FOLVITE) 1 MG tablet Take 1 tablet (1 mg total) by mouth daily. 90 tablet 1  .  LORazepam (ATIVAN) 1 MG tablet TAKE 1 TABLET BY MOUTH EVERY DAY 30 tablet 2  . Olmesartan-amLODIPine-HCTZ 20-5-12.5 MG TABS Take 1 tablet by mouth daily. 90 tablet 1  . rosuvastatin (CRESTOR) 20 MG tablet TAKE 1 TABLET BY MOUTH EVERY DAY 90 tablet 1  . thiamine (VITAMIN B-1) 100 MG tablet Take 1 tablet (100 mg total) by mouth daily. 90 tablet 1   No facility-administered medications prior to visit.    ROS Review of Systems  Constitutional: Negative for appetite change, chills, fatigue and fever.  HENT: Positive for trouble swallowing and voice change.   Eyes: Negative for visual disturbance.  Respiratory: Negative for cough, chest tightness, shortness of breath and wheezing.   Cardiovascular: Negative for chest pain, palpitations and leg swelling.  Gastrointestinal: Positive for diarrhea. Negative for abdominal pain, constipation, nausea and vomiting.  Endocrine: Negative.   Genitourinary: Positive for dysuria, frequency, hematuria and urgency. Negative for decreased urine volume, difficulty urinating and flank pain.  Musculoskeletal: Negative.  Negative for arthralgias, joint swelling and myalgias.  Skin: Negative.   Neurological: Negative.  Negative for dizziness and weakness.  Hematological: Negative for adenopathy. Does not bruise/bleed easily.  Psychiatric/Behavioral: Positive for dysphoric mood. Negative for confusion, decreased concentration, sleep disturbance and suicidal ideas. The patient is nervous/anxious.     Objective:  BP 136/82   Pulse 66   Temp 98.2 F (36.8 C) (Oral)  Ht 5\' 4"  (1.626 m)   Wt 127 lb (57.6 kg)   SpO2 99%   BMI 21.80 kg/m   BP Readings from Last 3 Encounters:  09/19/20 136/82  06/10/20 132/64  02/15/20 (!) 178/82    Wt Readings from Last 3 Encounters:  09/19/20 127 lb (57.6 kg)  02/15/20 125 lb (56.7 kg)  10/02/19 125 lb (56.7 kg)    Physical Exam Vitals reviewed.  Constitutional:      Appearance: She is ill-appearing (tracheostomy).   HENT:     Nose: Nose normal.     Mouth/Throat:     Mouth: Mucous membranes are moist.  Eyes:     General: No scleral icterus.    Conjunctiva/sclera: Conjunctivae normal.  Cardiovascular:     Rate and Rhythm: Normal rate and regular rhythm.     Heart sounds: No murmur heard.   Pulmonary:     Effort: Pulmonary effort is normal. No respiratory distress.     Breath sounds: No stridor. No wheezing, rhonchi or rales.  Abdominal:     General: There is no distension.     Tenderness: There is no abdominal tenderness. There is no guarding.  Musculoskeletal:        General: Normal range of motion.     Right lower leg: No edema.     Left lower leg: No edema.  Lymphadenopathy:     Cervical: No cervical adenopathy.  Skin:    General: Skin is warm and dry.  Neurological:     General: No focal deficit present.     Mental Status: She is alert.  Psychiatric:        Attention and Perception: Attention normal.        Mood and Affect: Mood is anxious.        Speech: Speech normal.        Behavior: Behavior normal. Behavior is cooperative.        Thought Content: Thought content normal.     Lab Results  Component Value Date   WBC 8.2 09/19/2020   HGB 10.5 (L) 09/19/2020   HCT 31.2 (L) 09/19/2020   PLT 243.0 09/19/2020   GLUCOSE 93 09/19/2020   CHOL 191 02/22/2020   TRIG 221.0 (H) 02/22/2020   HDL 53.30 02/22/2020   LDLDIRECT 88.0 02/22/2020   ALT 15 02/13/2019   AST 23 02/13/2019   NA 141 09/19/2020   K 4.5 09/19/2020   CL 107 09/19/2020   CREATININE 1.81 (H) 09/19/2020   BUN 45 (H) 09/19/2020   CO2 25 09/19/2020   TSH 6.82 (H) 09/19/2020   INR 1.17 08/17/2012   HGBA1C 5.8 08/08/2019    No results found.  Assessment & Plan:   [name] was seen today for anemia, hypothyroidism, urinary tract infection and hypertension.  Diagnoses and all orders for this visit:  Other specified hypothyroidism- Her TSH is in the acceptable range and she appears euthyroid.  She will stay on  the current dose of levothyroxine. -     TSH; Future -     TSH  CRI (chronic renal insufficiency), stage 4 (severe) (HCC)- her renal function is stable. She agrees to avoid nephrotoxic agents. Her BP is well controlled. -     Cancel: BASIC METABOLIC PANEL WITH GFR; Future -     Basic metabolic panel; Future -     Basic metabolic panel  Dietary folate deficiency anemia- Her H&H remain low.  I have asked her to be more compliant with the folate supplement. -  CBC with Differential/Platelet; Future -     Folate; Future -     folic acid (FOLVITE) 1 MG tablet; Take 1 tablet (1 mg total) by mouth daily. -     Folate -     CBC with Differential/Platelet  Thiamine deficiency- Her H&H remain low.  I have asked her to be more compliant with the thiamine supplement and I will monitor her thiamine level. -     CBC with Differential/Platelet; Future -     Cancel: Vitamin B1; Future -     thiamine (VITAMIN B-1) 100 MG tablet; Take 1 tablet (100 mg total) by mouth daily. -     CBC with Differential/Platelet  Essential hypertension- Her blood pressure is adequately well controlled. -     atenolol (TENORMIN) 25 MG tablet; Take 1 tablet (25 mg total) by mouth 2 (two) times daily. -     Olmesartan-amLODIPine-HCTZ 20-5-12.5 MG TABS; Take 1 tablet by mouth daily.  PAD (peripheral artery disease) (HCC) -     cilostazol (PLETAL) 100 MG tablet; Take 1 tablet (100 mg total) by mouth 2 (two) times daily.  Diarrhea, unspecified type -     diphenoxylate-atropine (LOMOTIL) 2.5-0.025 MG tablet; Take 1 tablet by mouth 4 (four) times daily as needed for diarrhea or loose stools.  GAD (generalized anxiety disorder) -     escitalopram (LEXAPRO) 20 MG tablet; Take 1 tablet (20 mg total) by mouth daily. -     LORazepam (ATIVAN) 1 MG tablet; Take 1 tablet (1 mg total) by mouth daily.  Dysphagia, pharyngoesophageal phase -     famotidine (PEPCID) 40 MG tablet; Take 1 tablet (40 mg total) by mouth  daily.  Gastroesophageal reflux disease without esophagitis -     famotidine (PEPCID) 40 MG tablet; Take 1 tablet (40 mg total) by mouth daily.  Hyperlipidemia LDL goal <130 -     rosuvastatin (CRESTOR) 20 MG tablet; Take 1 tablet (20 mg total) by mouth daily.  Acute cystitis with hematuria- Her urine culture is positive for E. coli that is sensitive to nitrofurantoin. -     Urinalysis, Routine w reflex microscopic; Future -     Cancel: CULTURE, URINE COMPREHENSIVE; Future -     nitrofurantoin, macrocrystal-monohydrate, (MACROBID) 100 MG capsule; Take 1 capsule (100 mg total) by mouth 2 (two) times daily for 5 days. -     Urine Culture; Future -     Urine Culture -     Urinalysis, Routine w reflex microscopic   I have discontinued Hebe D. Crance's amoxicillin. I have also changed her atenolol, cilostazol, famotidine, LORazepam, and rosuvastatin. Additionally, I am having her start on nitrofurantoin (macrocrystal-monohydrate). Lastly, I am having her maintain her D3-50, Ciclopirox, levothyroxine, Vitamin D (Ergocalciferol), fluticasone, diphenoxylate-atropine, escitalopram, folic acid, Olmesartan-amLODIPine-HCTZ, and thiamine.  Meds ordered this encounter  Medications  . atenolol (TENORMIN) 25 MG tablet    Sig: Take 1 tablet (25 mg total) by mouth 2 (two) times daily.    Dispense:  180 tablet    Refill:  1  . cilostazol (PLETAL) 100 MG tablet    Sig: Take 1 tablet (100 mg total) by mouth 2 (two) times daily.    Dispense:  180 tablet    Refill:  1  . diphenoxylate-atropine (LOMOTIL) 2.5-0.025 MG tablet    Sig: Take 1 tablet by mouth 4 (four) times daily as needed for diarrhea or loose stools.    Dispense:  75 tablet    Refill:  1  .  escitalopram (LEXAPRO) 20 MG tablet    Sig: Take 1 tablet (20 mg total) by mouth daily.    Dispense:  90 tablet    Refill:  1  . famotidine (PEPCID) 40 MG tablet    Sig: Take 1 tablet (40 mg total) by mouth daily.    Dispense:  90 tablet     Refill:  1  . folic acid (FOLVITE) 1 MG tablet    Sig: Take 1 tablet (1 mg total) by mouth daily.    Dispense:  90 tablet    Refill:  1  . LORazepam (ATIVAN) 1 MG tablet    Sig: Take 1 tablet (1 mg total) by mouth daily.    Dispense:  30 tablet    Refill:  2    Not to exceed 3 additional fills before 08/02/2020 DX Code Needed  .  [name] Olmesartan-amLODIPine-HCTZ 20-5-12.5 MG TABS    Sig: Take 1 tablet by mouth daily.    Dispense:  90 tablet    Refill:  1  . rosuvastatin (CRESTOR) 20 MG tablet    Sig: Take 1 tablet (20 mg total) by mouth daily.    Dispense:  90 tablet    Refill:  1  . thiamine (VITAMIN B-1) 100 MG tablet    Sig: Take 1 tablet (100 mg total) by mouth daily.    Dispense:  90 tablet    Refill:  1  . nitrofurantoin, macrocrystal-monohydrate, (MACROBID) 100 MG capsule    Sig: Take 1 capsule (100 mg total) by mouth 2 (two) times daily for 5 days.    Dispense:  10 capsule    Refill:  0   I spent 50 minutes in preparing to see the patient by review of recent labs, imaging and procedures, obtaining and reviewing separately obtained history, communicating with the patient and family or caregiver, ordering medications, tests or procedures, and documenting clinical information in the EHR including the differential Dx, treatment, and any further evaluation and other management of 1. Other specified hypothyroidism 2. CRI (chronic renal insufficiency), stage 4 (severe) (HCC) 3. Dietary folate deficiency anemia 4. Thiamine deficiency 5. Essential hypertension 6. PAD (peripheral artery disease) ([place]) 7. Diarrhea, unspecified type 8. GAD (generalized anxiety disorder) 9. Dysphagia, pharyngoesophageal phase 10. Gastroesophageal reflux disease without esophagitis 11. Hyperlipidemia LDL goal <130 12. Acute cystitis with hematuria     Follow-up: Return in about 3 weeks (around 10/10/2020).  Scarlette Calico, MD

## 2020-09-20 LAB — CBC WITH DIFFERENTIAL/PLATELET
Basophils Absolute: 0.1 10*3/uL (ref 0.0–0.1)
Basophils Relative: 1 % (ref 0.0–3.0)
Eosinophils Absolute: 0.1 10*3/uL (ref 0.0–0.7)
Eosinophils Relative: 1.4 % (ref 0.0–5.0)
HCT: 31.2 % — ABNORMAL LOW (ref 36.0–46.0)
Hemoglobin: 10.5 g/dL — ABNORMAL LOW (ref 12.0–15.0)
Lymphocytes Relative: 18.5 % (ref 12.0–46.0)
Lymphs Abs: 1.5 10*3/uL (ref 0.7–4.0)
MCHC: 33.6 g/dL (ref 30.0–36.0)
MCV: 83.6 fl (ref 78.0–100.0)
Monocytes Absolute: 0.6 10*3/uL (ref 0.1–1.0)
Monocytes Relative: 7.2 % (ref 3.0–12.0)
Neutro Abs: 5.9 10*3/uL (ref 1.4–7.7)
Neutrophils Relative %: 71.9 % (ref 43.0–77.0)
Platelets: 243 10*3/uL (ref 150.0–400.0)
RBC: 3.73 Mil/uL — ABNORMAL LOW (ref 3.87–5.11)
RDW: 13.9 % (ref 11.5–15.5)
WBC: 8.2 10*3/uL (ref 4.0–10.5)

## 2020-09-21 LAB — URINE CULTURE

## 2020-09-24 ENCOUNTER — Encounter: Payer: Self-pay | Admitting: Internal Medicine

## 2020-09-24 DIAGNOSIS — Z23 Encounter for immunization: Secondary | ICD-10-CM | POA: Diagnosis not present

## 2020-10-03 ENCOUNTER — Other Ambulatory Visit: Payer: Self-pay

## 2020-10-03 ENCOUNTER — Ambulatory Visit (INDEPENDENT_AMBULATORY_CARE_PROVIDER_SITE_OTHER): Payer: Medicare Other

## 2020-10-03 DIAGNOSIS — M8580 Other specified disorders of bone density and structure, unspecified site: Secondary | ICD-10-CM

## 2020-10-03 MED ORDER — DENOSUMAB 60 MG/ML ~~LOC~~ SOSY
60.0000 mg | PREFILLED_SYRINGE | Freq: Once | SUBCUTANEOUS | Status: AC
  Administered 2020-10-03: 60 mg via SUBCUTANEOUS

## 2020-10-03 NOTE — Progress Notes (Signed)
Prolia 60mg  given subcutaneously in left upper arm as ordered per Dr Ronnald Ramp.  Pt tolerated well.

## 2020-10-15 DIAGNOSIS — R490 Dysphonia: Secondary | ICD-10-CM | POA: Diagnosis not present

## 2020-10-23 ENCOUNTER — Telehealth: Payer: Self-pay | Admitting: Internal Medicine

## 2020-10-23 NOTE — Telephone Encounter (Signed)
   Daughter calling: Patient reports she is still having painful, frequent urination Last UA 10/7 Requesting additional medication

## 2020-10-24 ENCOUNTER — Other Ambulatory Visit: Payer: Self-pay | Admitting: Internal Medicine

## 2020-10-29 NOTE — Telephone Encounter (Signed)
Called pt, LVM.   

## 2020-12-05 ENCOUNTER — Ambulatory Visit (INDEPENDENT_AMBULATORY_CARE_PROVIDER_SITE_OTHER): Payer: Medicare Other | Admitting: Internal Medicine

## 2020-12-05 ENCOUNTER — Other Ambulatory Visit: Payer: Self-pay

## 2020-12-05 ENCOUNTER — Encounter: Payer: Self-pay | Admitting: Internal Medicine

## 2020-12-05 VITALS — BP 152/74 | HR 76 | Temp 98.4°F | Resp 16 | Ht 64.0 in | Wt 127.0 lb

## 2020-12-05 DIAGNOSIS — E519 Thiamine deficiency, unspecified: Secondary | ICD-10-CM

## 2020-12-05 DIAGNOSIS — N184 Chronic kidney disease, stage 4 (severe): Secondary | ICD-10-CM

## 2020-12-05 DIAGNOSIS — E038 Other specified hypothyroidism: Secondary | ICD-10-CM | POA: Diagnosis not present

## 2020-12-05 DIAGNOSIS — D520 Dietary folate deficiency anemia: Secondary | ICD-10-CM | POA: Diagnosis not present

## 2020-12-05 DIAGNOSIS — I10 Essential (primary) hypertension: Secondary | ICD-10-CM | POA: Diagnosis not present

## 2020-12-05 LAB — CBC WITH DIFFERENTIAL/PLATELET
Basophils Absolute: 0 10*3/uL (ref 0.0–0.1)
Basophils Relative: 0.4 % (ref 0.0–3.0)
Eosinophils Absolute: 0.1 10*3/uL (ref 0.0–0.7)
Eosinophils Relative: 1.4 % (ref 0.0–5.0)
HCT: 30.3 % — ABNORMAL LOW (ref 36.0–46.0)
Hemoglobin: 10.1 g/dL — ABNORMAL LOW (ref 12.0–15.0)
Lymphocytes Relative: 16.1 % (ref 12.0–46.0)
Lymphs Abs: 1.1 10*3/uL (ref 0.7–4.0)
MCHC: 33.5 g/dL (ref 30.0–36.0)
MCV: 84.7 fl (ref 78.0–100.0)
Monocytes Absolute: 0.4 10*3/uL (ref 0.1–1.0)
Monocytes Relative: 5.4 % (ref 3.0–12.0)
Neutro Abs: 5.4 10*3/uL (ref 1.4–7.7)
Neutrophils Relative %: 76.7 % (ref 43.0–77.0)
Platelets: 222 10*3/uL (ref 150.0–400.0)
RBC: 3.57 Mil/uL — ABNORMAL LOW (ref 3.87–5.11)
RDW: 14.3 % (ref 11.5–15.5)
WBC: 7 10*3/uL (ref 4.0–10.5)

## 2020-12-05 LAB — BASIC METABOLIC PANEL
BUN: 27 mg/dL — ABNORMAL HIGH (ref 6–23)
CO2: 22 mEq/L (ref 19–32)
Calcium: 8.6 mg/dL (ref 8.4–10.5)
Chloride: 111 mEq/L (ref 96–112)
Creatinine, Ser: 1.98 mg/dL — ABNORMAL HIGH (ref 0.40–1.20)
GFR: 24.37 mL/min — ABNORMAL LOW (ref >60.00)
Glucose, Bld: 108 mg/dL — ABNORMAL HIGH (ref 70–99)
Potassium: 4 mEq/L (ref 3.5–5.1)
Sodium: 141 mEq/L (ref 135–145)

## 2020-12-05 LAB — FOLATE: Folate: >23.6 ng/mL (ref >5.9)

## 2020-12-05 LAB — TSH: TSH: 6.05 u[IU]/mL — ABNORMAL HIGH (ref 0.35–4.50)

## 2020-12-05 NOTE — Patient Instructions (Signed)

## 2020-12-05 NOTE — Progress Notes (Signed)
Subjective:  Patient ID: [name], female    DOB: 28-Dec-1945  Age: 74 y.o. MRN: [id number]  CC: Hypertension  This visit occurred during the SARS-CoV-2 public health emergency.  Safety protocols were in place, including screening questions prior to the visit, additional usage of staff PPE, and extensive cleaning of exam room while observing appropriate contact time as indicated for disinfecting solutions.   History is obtained from her daughter who is on a cell phone today.  HPI KHARIZMA LESNICK presents for f/up - She complains that she had a spell 3 days ago. She was at home and had the sensation of shaking, weakness, fatigue, and cramps. She was near syncopal but did not pass out. Her family embraced her and helped her sit down. EMS was called to the house and she was told that her blood sugar was 118 and that her blood pressure was 186/80. She says the EMS team went through her medications and they said that she was mistakingly given a diabetic medication from the pharmacy. She does not know which medication it was. She is not being treated for diabetes. Her symptoms have resolved. She is at her baseline today.  Outpatient Medications Prior to Visit  Medication Sig Dispense Refill  . atenolol (TENORMIN) 25 MG tablet Take 1 tablet (25 mg total) by mouth 2 (two) times daily. 180 tablet 1  . Ciclopirox 0.77 % gel Apply 1 Act topically 2 (two) times daily. 100 g 1  . cilostazol (PLETAL) 100 MG tablet Take 1 tablet (100 mg total) by mouth 2 (two) times daily. 180 tablet 1  . D3-50 1.25 MG (50000 UT) capsule TAKE 1 CAPSULE BY MOUTH ONE TIME PER WEEK 12 capsule 0  . diphenoxylate-atropine (LOMOTIL) 2.5-0.025 MG tablet Take 1 tablet by mouth 4 (four) times daily as needed for diarrhea or loose stools. 75 tablet 1  . escitalopram (LEXAPRO) 20 MG tablet Take 1 tablet (20 mg total) by mouth daily. 90 tablet 1  . famotidine (PEPCID) 40 MG tablet Take 1 tablet (40 mg total) by mouth daily. 90  tablet 1  . fluticasone (FLONASE) 50 MCG/ACT nasal spray SPRAY 2 SPRAYS INTO EACH NOSTRIL EVERY DAY 16 g 0  . folic acid (FOLVITE) 1 MG tablet Take 1 tablet (1 mg total) by mouth daily. 90 tablet 1  . levothyroxine (SYNTHROID) 25 MCG tablet Take 1 tablet (25 mcg total) by mouth daily before breakfast. 90 tablet 0  . LORazepam (ATIVAN) 1 MG tablet Take 1 tablet (1 mg total) by mouth daily. 30 tablet 2  . Olmesartan-amLODIPine-HCTZ 20-5-12.5 MG TABS Take 1 tablet by mouth daily. 90 tablet 1  . rosuvastatin (CRESTOR) 20 MG tablet Take 1 tablet (20 mg total) by mouth daily. 90 tablet 1  . thiamine (VITAMIN B-1) 100 MG tablet Take 1 tablet (100 mg total) by mouth daily. 90 tablet 1  . Vitamin D, Ergocalciferol, (DRISDOL) 1.25 MG (50000 UNIT) CAPS capsule TAKE 1 CAPSULE BY MOUTH ONE TIME PER WEEK 12 capsule 0   No facility-administered medications prior to visit.    ROS Review of Systems  Constitutional: Positive for fatigue. Negative for appetite change, chills, diaphoresis, fever and unexpected weight change.  HENT: Negative.   Eyes: Negative.   Respiratory: Negative for cough, chest tightness, shortness of breath and wheezing.   Cardiovascular: Negative for chest pain, palpitations and leg swelling.  Gastrointestinal: Negative for abdominal pain, constipation, diarrhea, nausea and vomiting.  Endocrine: Negative.   Genitourinary: Negative.  Negative  for decreased urine volume, difficulty urinating, hematuria and urgency.  Musculoskeletal: Negative.  Negative for arthralgias and myalgias.  Skin: Negative.  Negative for color change and rash.  Neurological: Positive for weakness. Negative for dizziness, tremors, seizures, syncope, speech difficulty and light-headedness.  Hematological: Negative for adenopathy. Does not bruise/bleed easily.  Psychiatric/Behavioral: Negative.     Objective:  BP (!) 152/74   Pulse 76   Temp 98.4 F (36.9 C) (Oral)   Resp 16   Ht 5\' 4"  (1.626 m)   Wt 127  lb (57.6 kg)   SpO2 97%   BMI 21.80 kg/m   BP Readings from Last 3 Encounters:  12/05/20 (!) 152/74  09/19/20 136/82  06/10/20 132/64    Wt Readings from Last 3 Encounters:  12/05/20 127 lb (57.6 kg)  09/19/20 127 lb (57.6 kg)  02/15/20 125 lb (56.7 kg)    Physical Exam Vitals reviewed.  Constitutional:      Appearance: Normal appearance.  HENT:     Nose: Nose normal.     Mouth/Throat:     Mouth: Mucous membranes are moist.  Eyes:     General: No scleral icterus.    Conjunctiva/sclera: Conjunctivae normal.  Cardiovascular:     Rate and Rhythm: Normal rate and regular rhythm.     Heart sounds: No murmur heard.     Comments: EKG- NSR, 66 bpm No Q waves or LVH Flat/inverted T waves in III and anterior leads is old Pulmonary:     Effort: Pulmonary effort is normal.     Breath sounds: No stridor. Examination of the right-middle field reveals rhonchi. Examination of the left-middle field reveals rhonchi. Rhonchi present. No wheezing or rales.  Abdominal:     General: Abdomen is flat.     Palpations: There is no mass.     Tenderness: There is no abdominal tenderness. There is no guarding.  Musculoskeletal:     Cervical back: Neck supple.     Right lower leg: No edema.     Left lower leg: No edema.  Lymphadenopathy:     Cervical: No cervical adenopathy.  Skin:    General: Skin is warm and dry.     Coloration: Skin is not pale.     Findings: No rash.  Neurological:     General: No focal deficit present.     Mental Status: She is alert and oriented to person, place, and time. Mental status is at baseline.  Psychiatric:        Mood and Affect: Mood normal.        Behavior: Behavior normal.     Lab Results  Component Value Date   WBC 7.0 12/05/2020   HGB 10.1 (L) 12/05/2020   HCT 30.3 (L) 12/05/2020   PLT 222.0 12/05/2020   GLUCOSE 108 (H) 12/05/2020   CHOL 191 02/22/2020   TRIG 221.0 (H) 02/22/2020   HDL 53.30 02/22/2020   LDLDIRECT 88.0 02/22/2020   ALT  15 02/13/2019   AST 23 02/13/2019   NA 141 12/05/2020   K 4.0 12/05/2020   CL 111 12/05/2020   CREATININE 1.98 (H) 12/05/2020   BUN 27 (H) 12/05/2020   CO2 22 12/05/2020   TSH 6.05 (H) 12/05/2020   INR 1.17 08/17/2012   HGBA1C 5.8 08/08/2019    No results found.  Assessment & Plan:   Kaytlynn was seen today for hypertension.  Diagnoses and all orders for this visit:  Primary hypertension- It sounds like her recent near-syncopal episode was caused  by hypoglycemia. She has recovered. Her EKG is reassuring. Her blood pressure is not adequately well controlled. She will be more compliant with her antihypertensives. -     Basic metabolic panel; Future -     CBC with Differential/Platelet; Future -     EKG 12-Lead -     Basic metabolic panel -     CBC with Differential/Platelet  Other specified hypothyroidism- Her TSH is in the acceptable range and clinically she appears euthyroid. Will continue the current dose of levothyroxine. -     TSH; Future -     TSH  CRI (chronic renal insufficiency), stage 4 (severe) (HCC)- Her renal function is stable. Her blood pressure is not adequately well controlled. She will avoid nephrotoxic agents. -     Basic metabolic panel; Future -     CBC with Differential/Platelet; Future -     Basic metabolic panel -     CBC with Differential/Platelet  Thiamine deficiency- She remains anemic. I will monitor her B1 level. -     CBC with Differential/Platelet; Future -     Vitamin B1; Future -     CBC with Differential/Platelet -     Vitamin B1  Dietary folate deficiency anemia- She has a persistent anemia. Her folate level is normal. This is likely the anemia of renal insufficiency. -     CBC with Differential/Platelet; Future -     Folate; Future -     CBC with Differential/Platelet -     Folate   I am having Blanchie Serve Knack maintain her D3-50, Ciclopirox, levothyroxine, Vitamin D (Ergocalciferol), fluticasone, atenolol, cilostazol,  diphenoxylate-atropine, escitalopram, famotidine, folic acid, LORazepam, Olmesartan-amLODIPine-HCTZ, rosuvastatin, and thiamine.  No orders of the defined types were placed in this encounter.  I spent 50 minutes in preparing to see the patient by review of recent labs, imaging and procedures, obtaining and reviewing separately obtained history, communicating with the patient and family or caregiver, ordering medications, tests or procedures, and documenting clinical information in the EHR including the differential Dx, treatment, and any further evaluation and other management of 1. Primary hypertension 2. Other specified hypothyroidism 3. CRI (chronic renal insufficiency), stage 4 (severe) (HCC) 4. Thiamine deficiency 5. Dietary folate deficiency anemia     Follow-up: Return in about 3 months (around 03/05/2021).  Scarlette Calico, MD

## 2020-12-10 ENCOUNTER — Encounter: Payer: Self-pay | Admitting: Internal Medicine

## 2020-12-10 LAB — VITAMIN B1: Vitamin B1 (Thiamine): 36 nmol/L — ABNORMAL HIGH (ref 8–30)

## 2020-12-20 ENCOUNTER — Ambulatory Visit (INDEPENDENT_AMBULATORY_CARE_PROVIDER_SITE_OTHER): Payer: Medicare Other

## 2020-12-20 ENCOUNTER — Other Ambulatory Visit: Payer: Self-pay | Admitting: Family

## 2020-12-20 ENCOUNTER — Ambulatory Visit (HOSPITAL_COMMUNITY)
Admission: RE | Admit: 2020-12-20 | Discharge: 2020-12-20 | Disposition: A | Discharge status: Home / Self Care | Payer: Medicare Other | Source: Ambulatory Visit | Attending: Cardiology | Admitting: Cardiology

## 2020-12-20 ENCOUNTER — Encounter: Payer: Self-pay | Admitting: Family

## 2020-12-20 ENCOUNTER — Ambulatory Visit (INDEPENDENT_AMBULATORY_CARE_PROVIDER_SITE_OTHER): Payer: Medicare Other | Admitting: Family

## 2020-12-20 ENCOUNTER — Other Ambulatory Visit: Payer: Self-pay

## 2020-12-20 VITALS — BP 118/62 | HR 64 | Temp 98.3°F | Wt 127.0 lb

## 2020-12-20 DIAGNOSIS — M5442 Lumbago with sciatica, left side: Secondary | ICD-10-CM | POA: Diagnosis not present

## 2020-12-20 DIAGNOSIS — I824Y1 Acute embolism and thrombosis of unspecified deep veins of right proximal lower extremity: Secondary | ICD-10-CM

## 2020-12-20 DIAGNOSIS — E038 Other specified hypothyroidism: Secondary | ICD-10-CM

## 2020-12-20 DIAGNOSIS — R079 Chest pain, unspecified: Secondary | ICD-10-CM

## 2020-12-20 DIAGNOSIS — Z23 Encounter for immunization: Secondary | ICD-10-CM

## 2020-12-20 DIAGNOSIS — D649 Anemia, unspecified: Secondary | ICD-10-CM

## 2020-12-20 DIAGNOSIS — M79661 Pain in right lower leg: Secondary | ICD-10-CM

## 2020-12-20 DIAGNOSIS — M545 Low back pain, unspecified: Secondary | ICD-10-CM | POA: Diagnosis not present

## 2020-12-20 LAB — FERRITIN: Ferritin: 184.3 ng/mL (ref 10.0–291.0)

## 2020-12-20 MED ORDER — LEVOTHYROXINE SODIUM 50 MCG PO TABS
50.0000 ug | ORAL_TABLET | Freq: Every day | ORAL | 0 refills | Status: DC
Start: 2020-12-20 — End: 2021-03-14

## 2020-12-20 MED ORDER — APIXABAN (ELIQUIS) VTE STARTER PACK (10MG AND 5MG): ORAL_TABLET | ORAL | 0 refills | Status: DC

## 2020-12-20 NOTE — Addendum Note (Signed)
Addended by: Sherlene Shams on: 12/20/2020 04:06 PM   Modules accepted: Orders

## 2020-12-20 NOTE — Addendum Note (Signed)
Addended by: Earnstine Regal on: 12/20/2020 01:27 PM   Modules accepted: Orders

## 2020-12-20 NOTE — Progress Notes (Signed)
[name] is a 75 y.o. female with the following history as recorded in EpicCare:  Patient Active Problem List   Diagnosis Date Noted  . Other specified hypothyroidism 02/25/2020  . Senile osteoporosis 02/15/2020  . PAD (peripheral artery disease) ([place]) 02/15/2020  . Tinea corporis 02/15/2020  . Gastroesophageal reflux disease without esophagitis 09/09/2019  . Screen for colon cancer 01/19/2019  . Osteopenia 01/19/2019  . Breast cancer screening by mammogram 01/19/2019  . CRI (chronic renal insufficiency), stage 4 (severe) ([place]) 01/19/2019  . Thiamine deficiency 01/02/2018  . Pure hyperglyceridemia 12/29/2017  . Prediabetes 12/28/2017  . Hyperlipidemia LDL goal <130 12/28/2017  . COPD (chronic obstructive pulmonary disease) with chronic bronchitis ([place]) 12/28/2017  . Dietary folate deficiency anemia 12/28/2017  . Moderate episode of recurrent major depressive disorder ([place]) 10/22/2017  . Dysphagia, pharyngoesophageal phase 05/22/2014  . History of radiation therapy   . Chronic pain syndrome 09/03/2012  . Tobacco abuse 08/10/2012  . COPD (chronic obstructive pulmonary disease) ([place]) 08/10/2012  . 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 Hypertension   . GAD (generalized anxiety disorder)     Current Outpatient Medications  Medication Sig Dispense Refill  . atenolol (TENORMIN) 25 MG tablet Take 1 tablet (25 mg total) by mouth 2 (two) times daily. 180 tablet 1  . cilostazol (PLETAL) 100 MG tablet Take 1 tablet (100 mg total) by mouth 2 (two) times daily. 180 tablet 1  . diphenoxylate-atropine (LOMOTIL) 2.5-0.025 MG tablet Take 1 tablet by mouth 4 (four) times daily as needed for diarrhea or loose stools. 75 tablet 1  . escitalopram (LEXAPRO) 20 MG tablet Take 1 tablet (20 mg total) by mouth daily. 90 tablet 1  . famotidine (PEPCID) 40 MG tablet Take 1 tablet (40 mg total) by mouth daily. 90 tablet 1  . fluticasone (FLONASE) 50 MCG/ACT nasal spray SPRAY  2 SPRAYS INTO EACH NOSTRIL EVERY DAY 16 g 0  . folic acid (FOLVITE) 1 MG tablet Take 1 tablet (1 mg total) by mouth daily. 90 tablet 1  . LORazepam (ATIVAN) 1 MG tablet Take 1 tablet (1 mg total) by mouth daily. 30 tablet 2  . Olmesartan-amLODIPine-HCTZ 20-5-12.5 MG TABS Take 1 tablet by mouth daily. 90 tablet 1  . rosuvastatin (CRESTOR) 20 MG tablet Take 1 tablet (20 mg total) by mouth daily. 90 tablet 1  . thiamine (VITAMIN B-1) 100 MG tablet Take 1 tablet (100 mg total) by mouth daily. 90 tablet 1  . levothyroxine (SYNTHROID) 50 MCG tablet Take 1 tablet (50 mcg total) by mouth daily before breakfast. 90 tablet 0   No current facility-administered medications for this visit.    Allergies: Xyzal [levocetirizine dihydrochloride]  Past Medical History:  Diagnosis Date  . Anginal pain ([place])    ?  [name] Anxiety    takes Ativan prn  . Blood transfusion without reported diagnosis 09/15/12   2 units Prbc's  . Broken ribs   . Chronic back pain   . Constipation    related to pain meds  . COPD (chronic obstructive pulmonary disease) ([place]) 08/10/2012   denies  . Depression   . Gastrostomy in place [facility])    removed  . GERD (gastroesophageal reflux disease)    takes Zantac daily  . Headache(784.0)   . Hiatal hernia 08/10/2012  . History of radiation therapy 10/17/12-11/25/12   supraglottic larynx,high risk neck tumor bed 5880 cGy/28 sessions, high risk lymph node tumor bed 5600 cGy/20 sessions, mod risk lymph node tumor  bed 5040 cGy/20 sessions  . Hypercholesteremia    takes Pravastatin daily  . Hypertension    takes Tribenzor and Atenolol daily  . Insomnia    takes Amitriptyline daily  . Nausea    takes Zofran prn  . Pneumonia   . 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 Shortness of breath dyspnea   . Stroke [facility]) 2011   denies. no residual  . Uterine cancer [facility])     Past Surgical History:  Procedure Laterality Date  . ABDOMINAL SURGERY     r/t uterine  carcinoma  . APPENDECTOMY    . DIRECT LARYNGOSCOPY N/A 05/22/2014   Procedure: DIRECT LARYNGOSCOPY WITH ESOPHAGEAL DILATION;  Surgeon: Jerrell Belfast, MD;  Location: [place];  Service: ENT;  Laterality: N/A;  . ESOPHAGOSCOPY WITH DILITATION N/A 05/29/2015   Procedure: ESOPHAGOSCOPY WITH ESOPHAGEAL DILITATION;  Surgeon: Jerrell Belfast, MD;  Location: [facility] OR;  Service: ENT;  Laterality: N/A;  . Gastrostomy Tube removed   2013  . GASTROSTOMY W/ FEEDING TUBE  12  . GASTROTOMY    . HERNIA REPAIR     child  . LARYNGETOMY  08/31/2012   Procedure: LARYNGECTOMY;  Surgeon: Jerrell Belfast, MD;  Location: [place];  Service: ENT;  Laterality: N/A;  . LARYNGOSCOPY  08/10/2012   Procedure: LARYNGOSCOPY;  Surgeon: Jerrell Belfast, MD;  Location: [facility];  Service: ENT;  Laterality: N/A;  with biopsy  . RADICAL NECK DISSECTION  08/31/2012   Procedure: RADICAL NECK DISSECTION;  Surgeon: Jerrell Belfast, MD;  Location: [place];  Service: ENT;  Laterality: Bilateral;  . TRACHEAL ESOPHOGEAL PUNCTURE WITH REPAIR STOMA N/A 09/08/2013   Procedure: TRACHEAL ESOPHOGEAL PUNCTURE WITH PLACEMENT OF  PROVOX PROSTHESIS ;  Surgeon: Jerrell Belfast, MD;  Location: [place];  Service: ENT;  Laterality: N/A;  . TRACHEOSTOMY TUBE PLACEMENT  08/10/2012   Procedure: TRACHEOSTOMY;  Surgeon: Jerrell Belfast, MD;  Location: [facility];  Service: ENT;  Laterality: N/A;    No family history on file.  Social History   Tobacco Use  . Smoking status: Former Smoker    Packs/day: 0.25    Years: 50.00    Pack years: 12.50    Types: Cigarettes    Quit date: 05/27/2013    Years since quitting: 7.5  . Smokeless tobacco: Never Used  Substance Use Topics  . Alcohol use: No    Subjective:  Accompanied by daughter who serves as Optometrist;  2 week history of left leg pain- feels like pain starts in her left buttock and radiates down into left leg; Also complaining of "sharp splitting" pain in her right calf- pain is more noticeable with walking; Not  currently taking anything for symptom relief;   Objective:  Vitals:   12/20/20 1029  BP: 118/62  Pulse: 64  Temp: 98.3 F (36.8 C)  TempSrc: Oral  SpO2: 98%  Weight: 127 lb (57.6 kg)    General: Well developed, well nourished, in no acute distress  Skin : Warm and dry.  Head: Normocephalic and atraumatic  Eyes: Sclera and conjunctiva clear; pupils round and reactive to light; extraocular movements intact  Ears: External normal; canals clear; tympanic membranes normal  Oropharynx: Pink, supple. No suspicious lesions  Neck: Supple without thyromegaly, adenopathy  Lungs: Respirations unlabored; Musculoskeletal: No deformities; no active joint inflammation  Extremities: No edema, cyanosis, clubbing  Vessels: Symmetric bilaterally  Neurologic: Alert and oriented; speech intact; face symmetrical; moves all extremities well; CNII-XII intact without focal deficit   Assessment:  1. Right calf pain   2. Acute left-sided low back pain with left-sided sciatica   3. Other specified hypothyroidism   4. Anemia, unspecified type     Plan:  1. Check doppler today to rule out DVT; suspect it will be of low yield- encouraged to use Tylenol; 2. Check lumbar X-ray; 3. Increase Synthroid to 50 mcg; plan to re-check in 2-3 months; symptoms of hypothyroidism discussed; 4. Per patient, she cannot tolerate oral iron; would be interested in getting iron infusion if possible; check ferritin, TIBC and iron today; hemoglobin at 10 when checked 2 weeks ago;  This visit occurred during the SARS-CoV-2 public health emergency.  Safety protocols were in place, including screening questions prior to the visit, additional usage of staff PPE, and extensive cleaning of exam room while observing appropriate contact time as indicated for disinfecting solutions.     Return in about 3 months (around 03/20/2021).  Orders Placed This Encounter  Procedures  . DG Lumbar Spine Complete    Standing Status:   Future     Number of Occurrences:   1    Standing Expiration Date:   12/20/2021    Order Specific Question:   Reason for Exam (SYMPTOM  OR DIAGNOSIS REQUIRED)    Answer:   low back pain/ radiating symptoms    Order Specific Question:   Preferred imaging location?    Answer:   [facility]  . Ferritin    Standing Status:   Future    Number of Occurrences:   1    Standing Expiration Date:   12/20/2021  . Iron and TIBC    Standing Status:   Future    Number of Occurrences:   1    Standing Expiration Date:   12/20/2021    Requested Prescriptions   Signed Prescriptions Disp Refills  . levothyroxine (SYNTHROID) 50 MCG tablet 90 tablet 0    Sig: Take 1 tablet (50 mcg total) by mouth daily before breakfast.

## 2020-12-20 NOTE — Patient Instructions (Signed)
Hipotiroidismo Hypothyroidism  El hipotiroidismo ocurre cuando la glndula tiroidea no produce la cantidad suficiente de ciertas hormonas (es hipoactiva). La glndula tiroidea es una pequea glndula ubicada en la parte delantera inferior del cuello, justo delante de la trquea. Esta glndula produce hormonas que ayudan a Aeronautical engineer forma en la que el cuerpo [place] los alimentos para obtener energa (metabolismo) as como tambin la funcin cardaca y la funcin cerebral. Estas hormonas tambin juegan un papel para [employer] huesos fuertes. Cuando la tiroides es hipoactiva, produce muy poca cantidad de las hormonas tiroxina (T4) y triyodotironina (T3). Cules son las causas? Esta afeccin puede ser causada por lo siguiente:  Enfermedad de Hashimoto. Se trata de una enfermedad por la cual el sistema del cuerpo encargado de combatir las enfermedades (sistema inmunitario) ataca la glndula tiroidea. Esta es la causa ms frecuente.  Infecciones virales.  Embarazo.  Ciertos medicamentos.  Defectos congnitos.  Radioterapias anteriores en la cabeza o el cuello para Science writer.  Tratamiento previo con yodo radioactivo.  Exposicin a la radiacin en el ambiente, en el pasado.  Extirpacin quirrgica previa de una parte o de toda la tiroides.  Problemas con Ardelia Mems glndula ubicada en el centro del cerebro (hipfisis).  Falta de una cantidad suficiente de yodo en la dieta. Qu incrementa el riesgo? Es ms probable que usted sufra esta afeccin si:  Es mujer.  Tiene antecedentes familiares de afecciones tiroideas.  [place] un medicamento denominado litio.  Toma medicamentos que afectan el sistema inmunitario (inmunosupresores). Cules son los signos o los sntomas? Los sntomas de esta afeccin [employer] siguientes:  Sensacin de falta de energa ([place]).  Incapacidad para tolerar el fro.  Aumento de peso que no puede explicarse por un cambio en la dieta o en los hbitos de  ejercicio fsico.  Falta de apetito.  Piel seca.  Pelo grueso.  Irregularidades menstruales.  Ralentizacin de los procesos de pensamiento.  Estreimiento.  Tristeza o depresin. Cmo se diagnostica? Esta afeccin se puede diagnosticar en funcin de lo siguiente:  Los sntomas, sus antecedentes mdicos y un examen fsico.  Anlisis de sangre. Tambin puede someterse a estudios por imgenes como una ecografa o una resonancia magntica (RM). Cmo se trata? Esta afeccin se trata con medicamentos que reemplazan las hormonas tiroideas que el cuerpo no produce. Despus de Biochemist, clinical, pueden pasar varias semanas hasta la desaparicin de los sntomas. Siga estas indicaciones en su casa:  Tome los medicamentos de venta libre y los recetados solamente como se lo haya indicado el mdico.  Si empieza a tomar medicamentos nuevos, infrmele al mdico.  Consulting civil engineer a todas las visitas de control como se lo haya indicado el mdico. Esto es importante. ? A medida que la afeccin mejora, es posible que haya que modificar las dosis de los medicamentos de hormona tiroidea. ? Tendr que hacerse anlisis de sangre peridicamente, de modo que el mdico pueda Electrical engineer. Comunquese con un mdico si:  Los sntomas no mejoran con Dispensing optician.  Est tomando medicamentos de reemplazo de la tiroides y: ? [place]. ? Tiene temblores. ? Se siente ansioso. ? [place] de peso rpidamente. ? No puede Agricultural engineer. ? Tiene cambios emocionales. ? Tiene diarrea. ? Se siente dbil. Solicite ayuda inmediatamente si tiene:  Tourist information centre manager.  Latidos cardacos irregulares.  Latidos cardacos rpidos.  Dificultad para respirar. Resumen  El hipotiroidismo ocurre cuando la glndula tiroidea no produce la cantidad suficiente de ciertas hormonas (es hipoactiva).  Cuando la tiroides  es hipoactiva, produce muy poca cantidad de las hormonas tiroxina (T4) y triyodotironina  (T3).  La causa ms frecuente es la enfermedad de Hashimoto, una enfermedad por la cual el sistema del cuerpo encargado de combatir las enfermedades (sistema inmunitario) ataca la glndula tiroidea. La afeccin tambin puede ser consecuencia de infecciones virales, medicamentos, el embarazo o luego de un tratamiento de radioterapia en la cabeza o el cuello.  Los sntomas pueden incluir aumento de Sem[place], piel seca, estreimiento, sensacin de no tener energa e incapacidad para [employer] fro.  Esta afeccin se trata con medicamentos que reemplazan las hormonas tiroideas que el cuerpo no produce. Esta informacin no tiene Marine scientist el consejo del mdico. Asegrese de hacerle al mdico cualquier pregunta que tenga. Document Revised: 03/12/2018 Document Reviewed: 12/29/2017 Elsevier Patient Education  [place].

## 2020-12-21 LAB — IRON, TOTAL/TOTAL IRON BINDING CAP
%SAT: 28 % (calc) (ref 16–45)
Iron: 70 ug/dL (ref 45–160)
TIBC: 254 mcg/dL (calc) (ref 250–450)

## 2020-12-23 ENCOUNTER — Telehealth: Payer: Self-pay | Admitting: Hematology and Oncology

## 2020-12-23 NOTE — Telephone Encounter (Signed)
Received anew hem referral from Jodi Mourning for dvt. [name] has been scheduled to see Dr. Chryl Heck on 1/25 at 920am. Appt date and time has been given to the pt's DIL. Aware to arrive 20 minutes early.

## 2020-12-24 ENCOUNTER — Telehealth: Payer: Self-pay | Admitting: Internal Medicine

## 2020-12-24 NOTE — Telephone Encounter (Signed)
[name] calling regarding her mom, still having bad leg pain (left)  Need x-ray results  Injection in the left leg  Please follow-up with [name] at [phone] [facility] work [facility]) or talk to [employer]

## 2020-12-25 NOTE — Telephone Encounter (Signed)
Daughter calling back stating they got a call yesterday for the results, if we could please call her back

## 2020-12-26 ENCOUNTER — Inpatient Hospital Stay (HOSPITAL_COMMUNITY)
Admission: EM | Admit: 2020-12-26 | Discharge: 2021-01-02 | DRG: 281 | Disposition: A | Discharge status: Home / Self Care | Payer: Medicare Other | Attending: Internal Medicine | Admitting: Internal Medicine

## 2020-12-26 ENCOUNTER — Encounter (HOSPITAL_COMMUNITY): Payer: Self-pay | Admitting: Emergency Medicine

## 2020-12-26 ENCOUNTER — Other Ambulatory Visit: Payer: Self-pay

## 2020-12-26 DIAGNOSIS — I714 Abdominal aortic aneurysm, without rupture: Secondary | ICD-10-CM | POA: Diagnosis not present

## 2020-12-26 DIAGNOSIS — R0902 Hypoxemia: Secondary | ICD-10-CM | POA: Diagnosis not present

## 2020-12-26 DIAGNOSIS — R1084 Generalized abdominal pain: Secondary | ICD-10-CM | POA: Diagnosis not present

## 2020-12-26 DIAGNOSIS — N281 Cyst of kidney, acquired: Secondary | ICD-10-CM | POA: Diagnosis present

## 2020-12-26 DIAGNOSIS — Z86718 Personal history of other venous thrombosis and embolism: Secondary | ICD-10-CM

## 2020-12-26 DIAGNOSIS — N2889 Other specified disorders of kidney and ureter: Secondary | ICD-10-CM | POA: Diagnosis present

## 2020-12-26 DIAGNOSIS — N185 Chronic kidney disease, stage 5: Secondary | ICD-10-CM

## 2020-12-26 DIAGNOSIS — Z79890 Hormone replacement therapy (postmenopausal): Secondary | ICD-10-CM

## 2020-12-26 DIAGNOSIS — Z888 Allergy status to other drugs, medicaments and biological substances status: Secondary | ICD-10-CM

## 2020-12-26 DIAGNOSIS — F32A Depression, unspecified: Secondary | ICD-10-CM | POA: Diagnosis present

## 2020-12-26 DIAGNOSIS — R1314 Dysphagia, pharyngoesophageal phase: Secondary | ICD-10-CM | POA: Diagnosis present

## 2020-12-26 DIAGNOSIS — R064 Hyperventilation: Secondary | ICD-10-CM | POA: Diagnosis not present

## 2020-12-26 DIAGNOSIS — N184 Chronic kidney disease, stage 4 (severe): Secondary | ICD-10-CM | POA: Diagnosis not present

## 2020-12-26 DIAGNOSIS — I129 Hypertensive chronic kidney disease with stage 1 through stage 4 chronic kidney disease, or unspecified chronic kidney disease: Secondary | ICD-10-CM | POA: Diagnosis present

## 2020-12-26 DIAGNOSIS — Z8521 Personal history of malignant neoplasm of larynx: Secondary | ICD-10-CM

## 2020-12-26 DIAGNOSIS — E038 Other specified hypothyroidism: Secondary | ICD-10-CM | POA: Diagnosis present

## 2020-12-26 DIAGNOSIS — Z7901 Long term (current) use of anticoagulants: Secondary | ICD-10-CM

## 2020-12-26 DIAGNOSIS — Z8673 Personal history of transient ischemic attack (TIA), and cerebral infarction without residual deficits: Secondary | ICD-10-CM

## 2020-12-26 DIAGNOSIS — Z79899 Other long term (current) drug therapy: Secondary | ICD-10-CM

## 2020-12-26 DIAGNOSIS — I249 Acute ischemic heart disease, unspecified: Secondary | ICD-10-CM | POA: Diagnosis not present

## 2020-12-26 DIAGNOSIS — Z9221 Personal history of antineoplastic chemotherapy: Secondary | ICD-10-CM

## 2020-12-26 DIAGNOSIS — I213 ST elevation (STEMI) myocardial infarction of unspecified site: Secondary | ICD-10-CM | POA: Diagnosis not present

## 2020-12-26 DIAGNOSIS — K449 Diaphragmatic hernia without obstruction or gangrene: Secondary | ICD-10-CM | POA: Diagnosis not present

## 2020-12-26 DIAGNOSIS — Z930 Tracheostomy status: Secondary | ICD-10-CM

## 2020-12-26 DIAGNOSIS — J449 Chronic obstructive pulmonary disease, unspecified: Secondary | ICD-10-CM | POA: Diagnosis present

## 2020-12-26 DIAGNOSIS — I160 Hypertensive urgency: Secondary | ICD-10-CM | POA: Diagnosis present

## 2020-12-26 DIAGNOSIS — Z20822 Contact with and (suspected) exposure to covid-19: Secondary | ICD-10-CM | POA: Diagnosis not present

## 2020-12-26 DIAGNOSIS — Z923 Personal history of irradiation: Secondary | ICD-10-CM

## 2020-12-26 DIAGNOSIS — N179 Acute kidney failure, unspecified: Secondary | ICD-10-CM | POA: Diagnosis not present

## 2020-12-26 DIAGNOSIS — G894 Chronic pain syndrome: Secondary | ICD-10-CM | POA: Diagnosis present

## 2020-12-26 DIAGNOSIS — R52 Pain, unspecified: Secondary | ICD-10-CM | POA: Diagnosis not present

## 2020-12-26 DIAGNOSIS — E785 Hyperlipidemia, unspecified: Secondary | ICD-10-CM | POA: Diagnosis present

## 2020-12-26 DIAGNOSIS — C320 Malignant neoplasm of glottis: Secondary | ICD-10-CM | POA: Diagnosis present

## 2020-12-26 DIAGNOSIS — R079 Chest pain, unspecified: Secondary | ICD-10-CM

## 2020-12-26 DIAGNOSIS — I10 Essential (primary) hypertension: Secondary | ICD-10-CM | POA: Diagnosis present

## 2020-12-26 DIAGNOSIS — Z8542 Personal history of malignant neoplasm of other parts of uterus: Secondary | ICD-10-CM

## 2020-12-26 DIAGNOSIS — E039 Hypothyroidism, unspecified: Secondary | ICD-10-CM | POA: Diagnosis present

## 2020-12-26 DIAGNOSIS — I480 Paroxysmal atrial fibrillation: Secondary | ICD-10-CM | POA: Diagnosis present

## 2020-12-26 DIAGNOSIS — D649 Anemia, unspecified: Secondary | ICD-10-CM | POA: Diagnosis present

## 2020-12-26 DIAGNOSIS — R0602 Shortness of breath: Secondary | ICD-10-CM | POA: Diagnosis not present

## 2020-12-26 DIAGNOSIS — K219 Gastro-esophageal reflux disease without esophagitis: Secondary | ICD-10-CM | POA: Diagnosis present

## 2020-12-26 DIAGNOSIS — E7800 Pure hypercholesterolemia, unspecified: Secondary | ICD-10-CM | POA: Diagnosis present

## 2020-12-26 DIAGNOSIS — F411 Generalized anxiety disorder: Secondary | ICD-10-CM | POA: Diagnosis present

## 2020-12-26 DIAGNOSIS — N200 Calculus of kidney: Secondary | ICD-10-CM | POA: Diagnosis not present

## 2020-12-26 DIAGNOSIS — I739 Peripheral vascular disease, unspecified: Secondary | ICD-10-CM | POA: Diagnosis present

## 2020-12-26 HISTORY — DX: Peripheral vascular disease, unspecified: I73.9

## 2020-12-26 HISTORY — DX: Chronic kidney disease, stage 4 (severe): N18.4

## 2020-12-26 HISTORY — DX: Anemia, unspecified: D64.9

## 2020-12-26 LAB — CBC WITH DIFFERENTIAL/PLATELET
Abs Immature Granulocytes: 0.11 10*3/uL — ABNORMAL HIGH (ref 0.00–0.07)
Basophils Absolute: 0 10*3/uL (ref 0.0–0.1)
Basophils Relative: 0 %
Eosinophils Absolute: 0.1 10*3/uL (ref 0.0–0.5)
Eosinophils Relative: 1 %
HCT: 29.4 % — ABNORMAL LOW (ref 36.0–46.0)
Hemoglobin: 9.3 g/dL — ABNORMAL LOW (ref 12.0–15.0)
Immature Granulocytes: 1 %
Lymphocytes Relative: 15 %
Lymphs Abs: 1.4 10*3/uL (ref 0.7–4.0)
MCH: 28.4 pg (ref 26.0–34.0)
MCHC: 31.6 g/dL (ref 30.0–36.0)
MCV: 89.6 fL (ref 80.0–100.0)
Monocytes Absolute: 0.6 10*3/uL (ref 0.1–1.0)
Monocytes Relative: 6 %
Neutro Abs: 7.1 10*3/uL (ref 1.7–7.7)
Neutrophils Relative %: 77 %
Platelets: 227 10*3/uL (ref 150–400)
RBC: 3.28 MIL/uL — ABNORMAL LOW (ref 3.87–5.11)
RDW: 13 % (ref 11.5–15.5)
WBC: 9.2 10*3/uL (ref 4.0–10.5)
nRBC: 0 % (ref 0.0–0.2)

## 2020-12-26 LAB — COMPREHENSIVE METABOLIC PANEL
ALT: 19 U/L (ref 0–44)
AST: 21 U/L (ref 15–41)
Albumin: 3.5 g/dL (ref 3.5–5.0)
Alkaline Phosphatase: 53 U/L (ref 38–126)
Anion gap: 12 (ref 5–15)
BUN: 50 mg/dL — ABNORMAL HIGH (ref 8–23)
CO2: 18 mmol/L — ABNORMAL LOW (ref 22–32)
Calcium: 8.9 mg/dL (ref 8.9–10.3)
Chloride: 109 mmol/L (ref 98–111)
Creatinine, Ser: 2.1 mg/dL — ABNORMAL HIGH (ref 0.44–1.00)
GFR, Estimated: 24 mL/min — ABNORMAL LOW (ref >60)
Glucose, Bld: 124 mg/dL — ABNORMAL HIGH (ref 70–99)
Potassium: 4.9 mmol/L (ref 3.5–5.1)
Sodium: 139 mmol/L (ref 135–145)
Total Bilirubin: 0.3 mg/dL (ref 0.3–1.2)
Total Protein: 6.2 g/dL — ABNORMAL LOW (ref 6.5–8.1)

## 2020-12-26 MED ORDER — MORPHINE SULFATE (PF) 2 MG/ML IV SOLN
2.0000 mg | Freq: Once | INTRAVENOUS | Status: AC
  Administered 2020-12-26: 2 mg via INTRAVENOUS
  Filled 2020-12-26: qty 1

## 2020-12-26 MED ORDER — IOHEXOL 9 MG/ML PO SOLN
500.0000 mL | ORAL | Status: DC
Start: 2020-12-26 — End: 2020-12-27

## 2020-12-26 MED ORDER — IOHEXOL 9 MG/ML PO SOLN
ORAL | Status: AC
  Filled 2020-12-26: qty 1000

## 2020-12-26 NOTE — ED Triage Notes (Signed)
Pt bib [facility] for left sided neck spasm as well as 8/10 abd pain x2 days that radiates around right flank to back. Hypertensive at 240/100 on scene and 170/80 with EMS on arrival.  Pt was given Ativan by daughter on scene per EMS.  PT has it at home for anxiety.  Recent hx of stroke with right sided DVT.  Pt is on Eliquis.  Pt has a tracheostomy r/t throat cancer.  Also possible sciatica affecting left leg.

## 2020-12-26 NOTE — Telephone Encounter (Signed)
Janett Billow is calling in regards to the patients recent x-ray results. Please call her back at [phone].

## 2020-12-27 ENCOUNTER — Encounter (HOSPITAL_COMMUNITY): Payer: Self-pay | Admitting: Internal Medicine

## 2020-12-27 ENCOUNTER — Observation Stay (HOSPITAL_COMMUNITY): Payer: Medicare Other

## 2020-12-27 ENCOUNTER — Emergency Department (HOSPITAL_COMMUNITY): Payer: Medicare Other

## 2020-12-27 DIAGNOSIS — I249 Acute ischemic heart disease, unspecified: Secondary | ICD-10-CM | POA: Diagnosis not present

## 2020-12-27 DIAGNOSIS — N179 Acute kidney failure, unspecified: Secondary | ICD-10-CM | POA: Diagnosis not present

## 2020-12-27 DIAGNOSIS — I129 Hypertensive chronic kidney disease with stage 1 through stage 4 chronic kidney disease, or unspecified chronic kidney disease: Secondary | ICD-10-CM | POA: Diagnosis not present

## 2020-12-27 DIAGNOSIS — R7989 Other specified abnormal findings of blood chemistry: Secondary | ICD-10-CM | POA: Insufficient documentation

## 2020-12-27 DIAGNOSIS — I214 Non-ST elevation (NSTEMI) myocardial infarction: Secondary | ICD-10-CM | POA: Diagnosis not present

## 2020-12-27 DIAGNOSIS — N184 Chronic kidney disease, stage 4 (severe): Secondary | ICD-10-CM

## 2020-12-27 DIAGNOSIS — Z20822 Contact with and (suspected) exposure to covid-19: Secondary | ICD-10-CM | POA: Diagnosis not present

## 2020-12-27 DIAGNOSIS — F32A Depression, unspecified: Secondary | ICD-10-CM | POA: Diagnosis present

## 2020-12-27 DIAGNOSIS — C4492 Squamous cell carcinoma of skin, unspecified: Secondary | ICD-10-CM | POA: Diagnosis not present

## 2020-12-27 DIAGNOSIS — F411 Generalized anxiety disorder: Secondary | ICD-10-CM | POA: Diagnosis present

## 2020-12-27 DIAGNOSIS — K449 Diaphragmatic hernia without obstruction or gangrene: Secondary | ICD-10-CM | POA: Diagnosis not present

## 2020-12-27 DIAGNOSIS — Z86718 Personal history of other venous thrombosis and embolism: Secondary | ICD-10-CM | POA: Diagnosis not present

## 2020-12-27 DIAGNOSIS — I213 ST elevation (STEMI) myocardial infarction of unspecified site: Secondary | ICD-10-CM | POA: Diagnosis not present

## 2020-12-27 DIAGNOSIS — Z8673 Personal history of transient ischemic attack (TIA), and cerebral infarction without residual deficits: Secondary | ICD-10-CM | POA: Diagnosis not present

## 2020-12-27 DIAGNOSIS — I739 Peripheral vascular disease, unspecified: Secondary | ICD-10-CM

## 2020-12-27 DIAGNOSIS — R079 Chest pain, unspecified: Secondary | ICD-10-CM

## 2020-12-27 DIAGNOSIS — I480 Paroxysmal atrial fibrillation: Secondary | ICD-10-CM | POA: Diagnosis not present

## 2020-12-27 DIAGNOSIS — D649 Anemia, unspecified: Secondary | ICD-10-CM | POA: Diagnosis not present

## 2020-12-27 DIAGNOSIS — K219 Gastro-esophageal reflux disease without esophagitis: Secondary | ICD-10-CM | POA: Diagnosis present

## 2020-12-27 DIAGNOSIS — I714 Abdominal aortic aneurysm, without rupture: Secondary | ICD-10-CM | POA: Diagnosis not present

## 2020-12-27 DIAGNOSIS — R778 Other specified abnormalities of plasma proteins: Secondary | ICD-10-CM | POA: Diagnosis not present

## 2020-12-27 DIAGNOSIS — Z930 Tracheostomy status: Secondary | ICD-10-CM | POA: Diagnosis not present

## 2020-12-27 DIAGNOSIS — J449 Chronic obstructive pulmonary disease, unspecified: Secondary | ICD-10-CM | POA: Diagnosis present

## 2020-12-27 DIAGNOSIS — R609 Edema, unspecified: Secondary | ICD-10-CM | POA: Diagnosis not present

## 2020-12-27 DIAGNOSIS — R509 Fever, unspecified: Secondary | ICD-10-CM | POA: Diagnosis not present

## 2020-12-27 DIAGNOSIS — N2889 Other specified disorders of kidney and ureter: Secondary | ICD-10-CM | POA: Diagnosis present

## 2020-12-27 DIAGNOSIS — Z923 Personal history of irradiation: Secondary | ICD-10-CM | POA: Diagnosis not present

## 2020-12-27 DIAGNOSIS — Z8521 Personal history of malignant neoplasm of larynx: Secondary | ICD-10-CM | POA: Diagnosis not present

## 2020-12-27 DIAGNOSIS — N281 Cyst of kidney, acquired: Secondary | ICD-10-CM | POA: Diagnosis not present

## 2020-12-27 DIAGNOSIS — R0602 Shortness of breath: Secondary | ICD-10-CM | POA: Diagnosis not present

## 2020-12-27 DIAGNOSIS — G894 Chronic pain syndrome: Secondary | ICD-10-CM | POA: Diagnosis present

## 2020-12-27 DIAGNOSIS — R059 Cough, unspecified: Secondary | ICD-10-CM | POA: Diagnosis not present

## 2020-12-27 DIAGNOSIS — I160 Hypertensive urgency: Secondary | ICD-10-CM | POA: Diagnosis not present

## 2020-12-27 DIAGNOSIS — Z7901 Long term (current) use of anticoagulants: Secondary | ICD-10-CM | POA: Diagnosis not present

## 2020-12-27 DIAGNOSIS — N200 Calculus of kidney: Secondary | ICD-10-CM | POA: Diagnosis not present

## 2020-12-27 DIAGNOSIS — E785 Hyperlipidemia, unspecified: Secondary | ICD-10-CM | POA: Diagnosis present

## 2020-12-27 LAB — URINALYSIS, ROUTINE W REFLEX MICROSCOPIC
Bilirubin Urine: NEGATIVE
Glucose, UA: NEGATIVE mg/dL
Hgb urine dipstick: NEGATIVE
Ketones, ur: NEGATIVE mg/dL
Leukocytes,Ua: NEGATIVE
Nitrite: NEGATIVE
Protein, ur: >=300 mg/dL — AB
Specific Gravity, Urine: 1.013 (ref 1.005–1.030)
pH: 6 (ref 5.0–8.0)

## 2020-12-27 LAB — TROPONIN I (HIGH SENSITIVITY)
Troponin I (High Sensitivity): 681 ng/L (ref <18)
Troponin I (High Sensitivity): 684 ng/L (ref <18)
Troponin I (High Sensitivity): 829 ng/L (ref <18)

## 2020-12-27 LAB — ECHOCARDIOGRAM COMPLETE
Area-P 1/2: 2.17 cm2
Height: 64 in
S' Lateral: 3.5 cm
Weight: 2031.76 oz

## 2020-12-27 LAB — HEMOGLOBIN A1C
Hgb A1c MFr Bld: 5.2 % (ref 4.8–5.6)
Mean Plasma Glucose: 102.54 mg/dL

## 2020-12-27 LAB — HEPARIN LEVEL (UNFRACTIONATED): Heparin Unfractionated: 1.62 IU/mL — ABNORMAL HIGH (ref 0.30–0.70)

## 2020-12-27 LAB — LIPID PANEL
Cholesterol: 177 mg/dL (ref 0–200)
HDL: 35 mg/dL — ABNORMAL LOW (ref >40)
LDL Cholesterol: 73 mg/dL (ref 0–99)
Total CHOL/HDL Ratio: 5.1 RATIO
Triglycerides: 346 mg/dL — ABNORMAL HIGH (ref <150)
VLDL: 69 mg/dL — ABNORMAL HIGH (ref 0–40)

## 2020-12-27 LAB — TSH: TSH: 6.833 u[IU]/mL — ABNORMAL HIGH (ref 0.350–4.500)

## 2020-12-27 LAB — SARS CORONAVIRUS 2 (TAT 6-24 HRS): SARS Coronavirus 2: NEGATIVE

## 2020-12-27 LAB — APTT: aPTT: 83 seconds — ABNORMAL HIGH (ref 24–36)

## 2020-12-27 MED ORDER — ONDANSETRON HCL 4 MG/2ML IJ SOLN: 4.0000 mg | Freq: Four times a day (QID) | INTRAMUSCULAR | Status: DC | PRN

## 2020-12-27 MED ORDER — ZOLPIDEM TARTRATE 5 MG PO TABS
5.0000 mg | ORAL_TABLET | Freq: Every evening | ORAL | Status: DC | PRN
  Administered 2020-12-29 – 2021-01-01 (×4): 5 mg via ORAL
  Filled 2020-12-27 (×4): qty 1

## 2020-12-27 MED ORDER — ACETAMINOPHEN 500 MG PO TABS
1000.0000 mg | ORAL_TABLET | Freq: Once | ORAL | Status: AC
  Administered 2020-12-27: 1000 mg via ORAL
  Filled 2020-12-27: qty 2

## 2020-12-27 MED ORDER — ACETAMINOPHEN 650 MG RE SUPP: 650.0000 mg | Freq: Four times a day (QID) | RECTAL | Status: DC | PRN

## 2020-12-27 MED ORDER — ROSUVASTATIN CALCIUM 20 MG PO TABS
20.0000 mg | ORAL_TABLET | Freq: Every day | ORAL | Status: DC
  Administered 2020-12-27 – 2021-01-02 (×7): 20 mg via ORAL
  Filled 2020-12-27 (×7): qty 1

## 2020-12-27 MED ORDER — NEPRO/CARBSTEADY PO LIQD
237.0000 mL | Freq: Three times a day (TID) | ORAL | Status: DC | PRN
  Administered 2020-12-27: 237 mL via ORAL
  Filled 2020-12-27: qty 237

## 2020-12-27 MED ORDER — CAMPHOR-MENTHOL 0.5-0.5 % EX LOTN
1.0000 "application " | TOPICAL_LOTION | Freq: Three times a day (TID) | CUTANEOUS | Status: DC | PRN
  Filled 2020-12-27: qty 222

## 2020-12-27 MED ORDER — CALCIUM CARBONATE ANTACID 1250 MG/5ML PO SUSP
500.0000 mg | Freq: Four times a day (QID) | ORAL | Status: DC | PRN
  Administered 2020-12-28: 500 mg via ORAL
  Filled 2020-12-27 (×4): qty 5

## 2020-12-27 MED ORDER — ESCITALOPRAM OXALATE 10 MG PO TABS
20.0000 mg | ORAL_TABLET | Freq: Every day | ORAL | Status: DC
  Administered 2020-12-27 – 2021-01-02 (×7): 20 mg via ORAL
  Filled 2020-12-27 (×7): qty 2

## 2020-12-27 MED ORDER — ACETAMINOPHEN 325 MG PO TABS
650.0000 mg | ORAL_TABLET | Freq: Four times a day (QID) | ORAL | Status: DC | PRN
  Administered 2020-12-31 – 2021-01-02 (×3): 650 mg via ORAL
  Filled 2020-12-27 (×5): qty 2

## 2020-12-27 MED ORDER — CILOSTAZOL 100 MG PO TABS
100.0000 mg | ORAL_TABLET | Freq: Two times a day (BID) | ORAL | Status: DC
  Administered 2020-12-27 – 2021-01-02 (×13): 100 mg via ORAL
  Filled 2020-12-27 (×14): qty 1

## 2020-12-27 MED ORDER — FAMOTIDINE 20 MG PO TABS
20.0000 mg | ORAL_TABLET | Freq: Every day | ORAL | Status: DC
  Administered 2020-12-27 – 2021-01-02 (×7): 20 mg via ORAL
  Filled 2020-12-27 (×7): qty 1

## 2020-12-27 MED ORDER — ASPIRIN 300 MG RE SUPP: 300.0000 mg | RECTAL | Status: AC

## 2020-12-27 MED ORDER — TECHNETIUM TO 99M ALBUMIN AGGREGATED
4.2500 | Freq: Once | INTRAVENOUS | Status: AC | PRN
  Administered 2020-12-27: 4.25 via INTRAVENOUS

## 2020-12-27 MED ORDER — SODIUM CHLORIDE 0.9% FLUSH
3.0000 mL | Freq: Two times a day (BID) | INTRAVENOUS | Status: DC
  Administered 2020-12-27 – 2021-01-02 (×7): 3 mL via INTRAVENOUS

## 2020-12-27 MED ORDER — FLUTICASONE PROPIONATE 50 MCG/ACT NA SUSP
2.0000 | Freq: Every day | NASAL | Status: DC
  Administered 2020-12-27 – 2021-01-01 (×5): 2 via NASAL
  Filled 2020-12-27 (×2): qty 16

## 2020-12-27 MED ORDER — THIAMINE HCL 100 MG PO TABS
100.0000 mg | ORAL_TABLET | Freq: Every day | ORAL | Status: DC
  Administered 2020-12-27 – 2021-01-02 (×7): 100 mg via ORAL
  Filled 2020-12-27 (×7): qty 1

## 2020-12-27 MED ORDER — HEPARIN (PORCINE) 25000 UT/250ML-% IV SOLN
900.0000 [IU]/h | INTRAVENOUS | Status: DC
  Administered 2020-12-27: 11:00:00 1000 [IU]/h via INTRAVENOUS
  Administered 2020-12-28: 900 [IU]/h via INTRAVENOUS
  Filled 2020-12-27 (×5): qty 250

## 2020-12-27 MED ORDER — SODIUM CHLORIDE 0.9 % IV SOLN: 250.0000 mL | INTRAVENOUS | Status: DC | PRN

## 2020-12-27 MED ORDER — DIPHENOXYLATE-ATROPINE 2.5-0.025 MG PO TABS
1.0000 | ORAL_TABLET | Freq: Four times a day (QID) | ORAL | Status: DC | PRN
  Administered 2020-12-28 – 2021-01-02 (×7): 1 via ORAL
  Filled 2020-12-27 (×8): qty 1

## 2020-12-27 MED ORDER — FAMOTIDINE 20 MG PO TABS: 40.0000 mg | ORAL_TABLET | Freq: Every day | ORAL | Status: DC

## 2020-12-27 MED ORDER — NITROGLYCERIN 0.4 MG SL SUBL
0.4000 mg | SUBLINGUAL_TABLET | SUBLINGUAL | Status: DC | PRN
  Administered 2020-12-28: 0.4 mg via SUBLINGUAL
  Filled 2020-12-27: qty 1

## 2020-12-27 MED ORDER — SODIUM CHLORIDE 0.9% FLUSH: 3.0000 mL | INTRAVENOUS | Status: DC | PRN

## 2020-12-27 MED ORDER — SORBITOL 70 % SOLN
30.0000 mL | Status: DC | PRN
  Filled 2020-12-27: qty 30

## 2020-12-27 MED ORDER — HYDROXYZINE HCL 25 MG PO TABS: 25.0000 mg | ORAL_TABLET | Freq: Three times a day (TID) | ORAL | Status: DC | PRN

## 2020-12-27 MED ORDER — ASPIRIN 81 MG PO CHEW
324.0000 mg | CHEWABLE_TABLET | ORAL | Status: AC
  Administered 2020-12-27: 324 mg via ORAL
  Filled 2020-12-27: qty 4

## 2020-12-27 MED ORDER — LORAZEPAM 1 MG PO TABS
1.0000 mg | ORAL_TABLET | Freq: Every day | ORAL | Status: DC
Start: 2020-12-27 — End: 2021-01-02
  Administered 2020-12-27 – 2021-01-02 (×7): 1 mg via ORAL
  Filled 2020-12-27 (×7): qty 1

## 2020-12-27 MED ORDER — DOCUSATE SODIUM 283 MG RE ENEM
1.0000 | ENEMA | RECTAL | Status: DC | PRN
  Filled 2020-12-27: qty 1

## 2020-12-27 MED ORDER — FOLIC ACID 1 MG PO TABS
1.0000 mg | ORAL_TABLET | Freq: Every day | ORAL | Status: DC
  Administered 2020-12-27 – 2021-01-02 (×7): 1 mg via ORAL
  Filled 2020-12-27 (×7): qty 1

## 2020-12-27 MED ORDER — METOPROLOL TARTRATE 25 MG PO TABS
25.0000 mg | ORAL_TABLET | Freq: Two times a day (BID) | ORAL | Status: DC
  Administered 2020-12-27 – 2020-12-28 (×2): 25 mg via ORAL
  Filled 2020-12-27 (×2): qty 1

## 2020-12-27 MED ORDER — LEVOTHYROXINE SODIUM 50 MCG PO TABS
50.0000 ug | ORAL_TABLET | Freq: Every day | ORAL | Status: DC
  Administered 2020-12-27 – 2021-01-02 (×6): 50 ug via ORAL
  Filled 2020-12-27 (×6): qty 1

## 2020-12-27 MED ORDER — ONDANSETRON HCL 4 MG PO TABS: 4.0000 mg | ORAL_TABLET | Freq: Four times a day (QID) | ORAL | Status: DC | PRN

## 2020-12-27 NOTE — Progress Notes (Addendum)
Received word from nurse that nuc med reviewed plan for nuc - has to be 48 hours from VQ scan. Spoke with Dr. Acie Fredrickson who gave the OK to move this to Sunday. I spoke with [place] with the nuc med department to discuss the timing of the nuc relative to the VQ scan. [place] is going to notify Waunita Schooner who is on call this weekend to be aware of the need for the study that day. Orders have been adjusted in the computer. Also sent message to weekend APP team to be aware. Sherrod Toothman PA-C

## 2020-12-27 NOTE — ED Notes (Signed)
Pt transported to XRAY °

## 2020-12-27 NOTE — Progress Notes (Signed)
  Echocardiogram 2D Echocardiogram has been performed.  [name] Dance 12/27/2020, 2:27 PM

## 2020-12-27 NOTE — Progress Notes (Signed)
  Echocardiogram 2D Echocardiogram has been attempted. Patient not in room. Will reattempt at later time.  Randa Lynn Daemion Mcniel 12/27/2020, 10:37 AM

## 2020-12-27 NOTE — H&P (Signed)
History and Physical    [name]:[id number] DOB: April 12, 1946 DOA: 12/26/2020  PCP: Janith Lima, MD Consultants:  Chryl Heck - oncology; Joya Gaskins - ENT Patient coming from:  Home - lives with son and daughter-in-law; NOK: Wayne Sever, [phone]; DIL, Cindee Lame, [phone]   Chief Complaint:  Chest pain, SOB  HPI: [name] is a 75 y.o. 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  Yesterday, her DIL went to pick up her daughter.  They patient called an Melburn Popper and went to [facility] and asked her to pick her a ride.  It took her 20 minutes to get there and she felt sick.  She had a cramp in her neck.  She got in the [place] and complained of SOB.  She complained of not being able to feel her B hands, face, SOB.  She called 911.  She was unable to lift her hands, make any face, could not coordinate.  She takes Ativan for anxiety and EMS had her take one.  Her BP was 240/ with EMS -> 178 en route -> 168 in the hospital and continued to improve.  She did have chest pain, substernal.  She was so upset that she couldn't breathe.  It resolved when she was in the ambulance.  She did also complain of abdominal pain.  She was told she had a mini-stroke 2-3 weeks ago, shaking and refused to go to the hospital.  EMS came and her BP was ok.  She did f/u with her PCP on 12/23.  The note indicates near syncope possibly due to hypoglycemia.  She then had leg pain and saw PCP on 1/7; she was diagnosed with a RLE DVT and started on Eliquis.    She would want to be a full code.    ED Course:  Carryover, per Dr. Cyd Silence:  75 year old female with past medical history of laryngeal cancer (S/P total laryngectomy 2013 S/P chemo and radiation), hypertension, gastroesophageal reflux disease, COPD, anxiety disorder and recent diagnosis of DVT earlier  this month now on Eliquis who had an episode of intense diaphoresis, headache, neck pain and abdominal pain. EMS was contacted who stated patient's SBP's ranged between 170's to 240's en route. Patient seemed to improve after ativan by EMS. On arrival, Troponin found to be 684, 681. Patient is chest pain free, no ecg changes. Cardama thinks this may have been due to hypertensive urgency/crisis that is now improving.  Cardama will contact cardiology to request formal consult and is asking that we place in observation to cycle troponins. Cardama did not start Heparin due to ongoing Eliquis use. Will place in obs in tele bed.   Review of Systems: As per HPI; otherwise review of systems reviewed and negative.   Ambulatory Status:  Ambulates without assistance  COVID Vaccine Status:  Complete  Past Medical History:  Diagnosis Date  . Anemia   . Anxiety    takes Ativan prn  . Blood transfusion without reported diagnosis 09/15/12   2 units Prbc's  . Broken ribs   . Chronic back pain   . CKD (chronic kidney disease), stage IV ([place])   . Constipation    related to pain meds  . COPD (chronic obstructive pulmonary disease) ([place]) 08/10/2012   denies  . Depression   . Gastrostomy in place [facility])    removed  . GERD (  gastroesophageal reflux disease)    takes Zantac daily  . Headache(784.0)   . Hiatal hernia 08/10/2012  . History of radiation therapy 10/17/12-11/25/12   supraglottic larynx,high risk neck tumor bed 5880 cGy/28 sessions, high risk lymph node tumor bed 5600 cGy/20 sessions, mod risk lymph node tumor bed 5040 cGy/20 sessions  . Hypercholesteremia    takes Pravastatin daily  . Hypertension    takes Tribenzor and Atenolol daily  . Insomnia    takes Amitriptyline daily  . Nausea    takes Zofran prn  . PAD (peripheral artery disease) ([place])    noninvasive imaging in 2016  . Pneumonia   . 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 Shortness of breath dyspnea    . Stroke [facility]) 2011   denies. no residual  . Uterine cancer [facility])     Past Surgical History:  Procedure Laterality Date  . ABDOMINAL SURGERY     r/t uterine carcinoma  . APPENDECTOMY    . DIRECT LARYNGOSCOPY N/A 05/22/2014   Procedure: DIRECT LARYNGOSCOPY WITH ESOPHAGEAL DILATION;  Surgeon: Jerrell Belfast, MD;  Location: [place];  Service: ENT;  Laterality: N/A;  . ESOPHAGOSCOPY WITH DILITATION N/A 05/29/2015   Procedure: ESOPHAGOSCOPY WITH ESOPHAGEAL DILITATION;  Surgeon: Jerrell Belfast, MD;  [facility];  Service: ENT;  Laterality: N/A;  . Gastrostomy Tube removed   2013  . GASTROSTOMY W/ FEEDING TUBE  13  . GASTROTOMY    . HERNIA REPAIR     child  . LARYNGETOMY  08/31/2012   Procedure: LARYNGECTOMY;  Surgeon: Jerrell Belfast, MD;  Location: [place];  Service: ENT;  Laterality: N/A;  . LARYNGOSCOPY  08/10/2012   Procedure: LARYNGOSCOPY;  Surgeon: Jerrell Belfast, MD;  Location: [facility];  Service: ENT;  Laterality: N/A;  with biopsy  . RADICAL NECK DISSECTION  08/31/2012   Procedure: RADICAL NECK DISSECTION;  Surgeon: Jerrell Belfast, MD;  Location: [place];  Service: ENT;  Laterality: Bilateral;  . TRACHEAL ESOPHOGEAL PUNCTURE WITH REPAIR STOMA N/A 09/08/2013   Procedure: TRACHEAL ESOPHOGEAL PUNCTURE WITH PLACEMENT OF  PROVOX PROSTHESIS ;  Surgeon: Jerrell Belfast, MD;  Location: [place];  Service: ENT;  Laterality: N/A;  . TRACHEOSTOMY TUBE PLACEMENT  08/10/2012   Procedure: TRACHEOSTOMY;  Surgeon: Jerrell Belfast, MD;  Location: [facility];  Service: ENT;  Laterality: N/A;    Social History   Socioeconomic History  . Marital status: Widowed    Spouse name: Not on file  . Number of children: 4  . Years of education: Not on file  . Highest education level: Not on file  Occupational History    Comment: retired Regulatory affairs officer  Tobacco Use  . Smoking status: Former Smoker    Packs/day: 0.25    Years: 50.00    Pack years: 12.50    Types: Cigarettes    Quit date: 05/27/2013    Years  since quitting: 7.5  . Smokeless tobacco: Never Used  Substance and Sexual Activity  . Alcohol use: Yes    Comment: 1-2 shots per week  . Drug use: No  . Sexual activity: Never  Other Topics Concern  . Not on file  Social History Narrative  . Not on file   Social Determinants of Health   Financial Resource Strain: Not on file  Food Insecurity: Not on file  Transportation Needs: Not on file  Physical Activity: Not on file  Stress: Not on file  Social Connections: Not on file  Intimate Partner Violence: Not on  file    Allergies  Allergen Reactions  . Xyzal [Levocetirizine Dihydrochloride] Itching    Family History  Problem Relation Age of Onset  . CAD Neg Hx     Prior to Admission medications   Medication Sig Start Date End Date Taking? Authorizing Provider  APIXABAN Arne Cleveland) VTE STARTER PACK (10MG  AND 5MG ) Take as directed on package: start with two-5mg  tablets twice daily for 7 days. On day 8, switch to one-5mg  tablet twice daily. 12/20/20   Marrian Salvage, FNP  atenolol (TENORMIN) 25 MG tablet Take 1 tablet (25 mg total) by mouth 2 (two) times daily. 09/19/20   Janith Lima, MD  cilostazol (PLETAL) 100 MG tablet Take 1 tablet (100 mg total) by mouth 2 (two) times daily. 09/19/20   Janith Lima, MD  diphenoxylate-atropine (LOMOTIL) 2.5-0.025 MG tablet Take 1 tablet by mouth 4 (four) times daily as needed for diarrhea or loose stools. 09/19/20   Janith Lima, MD  escitalopram (LEXAPRO) 20 MG tablet Take 1 tablet (20 mg total) by mouth daily. 09/19/20   Janith Lima, MD  famotidine (PEPCID) 40 MG tablet Take 1 tablet (40 mg total) by mouth daily. 09/19/20   Janith Lima, MD  fluticasone [facility]) 50 MCG/ACT nasal spray SPRAY 2 SPRAYS INTO EACH NOSTRIL EVERY DAY 06/10/20   Raylene Everts, MD  folic acid (FOLVITE) 1 MG tablet Take 1 tablet (1 mg total) by mouth daily. 09/19/20   Janith Lima, MD  levothyroxine (SYNTHROID) 50 MCG tablet Take 1 tablet (50  mcg total) by mouth daily before breakfast. 12/20/20   Marrian Salvage, FNP  LORazepam (ATIVAN) 1 MG tablet Take 1 tablet (1 mg total) by mouth daily. 09/19/20   Janith Lima, MD  Olmesartan-amLODIPine-HCTZ 20-5-12.5 MG TABS Take 1 tablet by mouth daily. 09/19/20   Janith Lima, MD  rosuvastatin (CRESTOR) 20 MG tablet Take 1 tablet (20 mg total) by mouth daily. 09/19/20   Janith Lima, MD  thiamine (VITAMIN B-1) 100 MG tablet Take 1 tablet (100 mg total) by mouth daily. 09/19/20   Janith Lima, MD  levocetirizine (XYZAL) 5 MG tablet TAKE 1 TABLET(5 MG) BY MOUTH EVERY EVENING 02/21/20 06/10/20  Janith Lima, MD    Physical Exam: Vitals:   12/27/20 1420 12/27/20 1421 12/27/20 1430 12/27/20 1544  BP: 136/66  137/60 (!) 173/69  Pulse:  67 67 69  Resp:  14 16 16   Temp:    98.8 F (37.1 C)  TempSrc:    Oral  SpO2:  97% 94% 97%  Weight:    57.8 kg  Height:    5\' 4"  (1.626 m)     . General:  Appears calm and comfortable and is in NAD; she did follow commands (in Spanish) but did not attempt to speak at all while I was in the room . Eyes:  PERRL, EOMI, normal lids, iris . ENT:  grossly normal hearing, lips & tongue, mmm . Neck:  no LAD, masses or thyromegaly; trach in place  . Cardiovascular:  RRR, no m/r/g. No LE edema.  [name] Respiratory:   CTA bilaterally with no wheezes/rales/rhonchi.  Normal respiratory effort. . Abdomen:  soft, NT, ND, NABS . Back:   normal alignment, no CVAT . Skin:  no rash or induration seen on limited exam . Musculoskeletal:  grossly normal tone BUE/BLE, good ROM, no bony abnormality . Psychiatric:  Flat mood and affect, speech not present at all although she appeared  to understand directions . Neurologic:  CN 2-12 grossly intact, moves all extremities in coordinated fashion    Radiological Exams on Admission: Independently reviewed - see discussion in A/P where applicable  DG Chest 2 View  Result Date: 12/27/2020 CLINICAL DATA:  Cough, fever  EXAM: CHEST - 2 VIEW COMPARISON:  10/30/2015 FINDINGS: The heart size and mediastinal contours are within normal limits. Atherosclerotic calcification of the aortic knob. No focal airspace consolidation, pleural effusion, or pneumothorax. The visualized skeletal structures are unremarkable. IMPRESSION: No active cardiopulmonary disease. Electronically Signed   By: Davina Poke D.O.   On: 12/27/2020 11:51   US RENAL  Result Date: 12/27/2020 CLINICAL DATA:  Renal mass EXAM: RENAL / URINARY TRACT ULTRASOUND COMPLETE COMPARISON:  Same day CT abdomen pelvis, 12:34 a.m. FINDINGS: Right Kidney: Renal measurements: 9.4 x 5.4 x 4.8 cm = volume: 129 mL. Echogenicity within normal limits. Multiple simple appearing cysts measuring up to 2.5 cm. No solid mass or hydronephrosis visualized. Left Kidney: Renal measurements: 9.2 x 4.8 x 5.0 cm = volume: 114 mL mL. Echogenicity within normal limits. Multiple simple appearing cysts measuring up to 2.1 cm. No solid mass or hydronephrosis visualized. Bladder: Appears normal for degree of bladder distention. Other: None. IMPRESSION: Multiple bilateral simple appearing renal cysts. No evidence of solid mass, with particular attention to the superior pole of the right kidney. There is a simple cyst in this location which appears to correspond to finding of prior CT. Electronically Signed   By: Eddie Candle M.D.   On: 12/27/2020 11:08   NM Pulmonary Perf and Vent  Result Date: 12/27/2020 CLINICAL DATA:  Squamous cell carcinoma with chest pain EXAM: NUCLEAR MEDICINE PERFUSION LUNG SCAN TECHNIQUE: Perfusion images were obtained in multiple projections after intravenous injection of radiopharmaceutical. Views: Anterior, posterior, left lateral, right lateral, RPO, LPO, RAO, LAO. RADIOPHARMACEUTICALS:  4.25 mCi Tc-67m MAA IV COMPARISON:  Chest radiograph December 27, 2020 FINDINGS: Radiotracer uptake is homogeneous and symmetric bilaterally. No perfusion defects evident. IMPRESSION:  No perfusion defects evident. No findings indicative of pulmonary embolus. Electronically Signed   By: Lowella Grip III M.D.   On: 12/27/2020 13:55   ECHOCARDIOGRAM COMPLETE  Result Date: 12/27/2020    ECHOCARDIOGRAM REPORT   Patient Name:   SHARLOT STURKEY Date of Exam: 12/27/2020 Medical Rec #:  [id number]        Height:       64.0 in Accession #:    [id number]       Weight:       127.0 lb Date of Birth:  11/01/1946       BSA:          1.613 m Patient Age:    75 years         BP:           135/66 mmHg Patient Gender: F                HR:           69 bpm. Exam Location:  Inpatient Procedure: 2D Echo, Cardiac Doppler and Color Doppler Indications:    Elevated Troponin.  History:        Patient has no prior history of Echocardiogram examinations.                 Stroke, COPD and PAD, Signs/Symptoms:Dyspnea; Risk                 Factors:Hypertension and Dyslipidemia. CKD. Cancer. GERD.  Sonographer:  Tiffany Dance Referring Phys: 72 [place]  1. Left ventricular ejection fraction, by estimation, is 55 to 60%. The left ventricle has normal function. The left ventricle has no regional wall motion abnormalities. Left ventricular diastolic parameters are indeterminate.  2. Right ventricular systolic function is normal. The right ventricular size is normal.  3. The mitral valve is grossly normal. No evidence of mitral valve regurgitation.  4. The aortic valve is tricuspid. Aortic valve regurgitation is not visualized. No aortic stenosis is present. Comparison(s): No prior Echocardiogram. Conclusion(s)/Recommendation(s): Normal biventricular function without evidence of hemodynamically significant valvular heart disease. FINDINGS  Left Ventricle: Left ventricular ejection fraction, by estimation, is 55 to 60%. The left ventricle has normal function. The left ventricle has no regional wall motion abnormalities. The left ventricular internal cavity size was small. There is no left ventricular  hypertrophy. Left ventricular diastolic parameters are indeterminate. Right Ventricle: The right ventricular size is normal. No increase in right ventricular wall thickness. Right ventricular systolic function is normal. Left Atrium: Left atrial size was normal in size. Right Atrium: Right atrial size was normal in size. Pericardium: There is no evidence of pericardial effusion. Mitral Valve: The mitral valve is grossly normal. Mild mitral annular calcification. No evidence of mitral valve regurgitation. Tricuspid Valve: The tricuspid valve is grossly normal. Tricuspid valve regurgitation is not demonstrated. Aortic Valve: The aortic valve is tricuspid. There is mild aortic valve annular calcification. Aortic valve regurgitation is not visualized. No aortic stenosis is present. Pulmonic Valve: The pulmonic valve was grossly normal. Pulmonic valve regurgitation is trivial. Aorta: The aortic root and ascending aorta are structurally normal, with no evidence of dilitation. IAS/Shunts: The atrial septum is grossly normal.  LEFT VENTRICLE PLAX 2D LVIDd:         3.70 cm  Diastology LVIDs:         3.50 cm  LV e' medial:    4.13 cm/s LV PW:         1.30 cm  LV E/e' medial:  14.3 LV IVS:        0.90 cm  LV e' lateral:   3.70 cm/s LVOT diam:     1.70 cm  LV E/e' lateral: 15.9 LV SV:         41 LV SV Index:   25 LVOT Area:     2.27 cm  RIGHT VENTRICLE             IVC RV Basal diam:  2.20 cm     IVC diam: 1.50 cm RV S prime:     12.10 cm/s TAPSE (M-mode): 1.4 cm LEFT ATRIUM             Index       RIGHT ATRIUM          Index LA diam:        3.70 cm 2.29 cm/m  RA Area:     9.07 cm LA Vol (A2C):   66.6 ml 41.29 ml/m RA Volume:   18.00 ml 11.16 ml/m LA Vol (A4C):   32.5 ml 20.15 ml/m LA Biplane Vol: 47.9 ml 29.70 ml/m  AORTIC VALVE LVOT Vmax:   88.60 cm/s LVOT Vmean:  51.100 cm/s LVOT VTI:    0.181 m  AORTA Ao Root diam: 3.00 cm Ao Asc diam:  3.00 cm MITRAL VALVE MV Area (PHT): 2.17 cm    SHUNTS MV Decel Time: 349 msec     Systemic VTI:  0.18 m MV E velocity: 58.90  cm/s  Systemic Diam: 1.70 cm MV A velocity: 90.00 cm/s MV E/A ratio:  0.65 Rudean Haskell MD Electronically signed by Rudean Haskell MD Signature Date/Time: 12/27/2020/4:40:12 PM    Final    CT Renal Stone Study  Result Date: 12/27/2020 CLINICAL DATA:  Flank pain, stone disease suspected EXAM: CT ABDOMEN AND PELVIS WITHOUT CONTRAST TECHNIQUE: Multidetector CT imaging of the abdomen and pelvis was performed following the standard protocol without IV contrast. COMPARISON:  CT 05/03/2003 FINDINGS: Lower chest: Lung bases are clear. Normal heart size. No pericardial effusion. Few calcifications on the mitral annulus. Coronary artery calcifications and distal thoracic aortic calcifications are present as well. Hypoattenuation of the cardiac blood pool, suggestive of anemia. Hepatobiliary: No visible focal liver lesion within limitations of this unenhanced CT. Normal liver attenuation. Smooth liver surface contour. Normal gallbladder and biliary tree without visible calcified gallstone. Pancreas: No pancreatic ductal dilatation or surrounding inflammatory changes. Spleen: Normal in size. No concerning splenic lesions. Punctate calcification in the spleen may be vascular or related to prior granulomatous disease (3/22) normal splenic size. No worrisome splenic lesions. Adrenals/Urinary Tract: Normal adrenal glands. Since 2004, there has been development of numerous cysts throughout the kidneys, while many are fluid attenuation, there are several more hyperdense cysts including a 10 mm partially exophytic cyst in the interpolar right kidney (3/35 and a cyst with some layering hyperattenuation in the upper pole left kidney measuring 1.8 cm (3/27). Additionally, there is a more conspicuous almost solid-appearing lesion arising from the upper pole right kidney which measures approximately 2.5 x 2.2 x 2.8 cm, incompletely characterized on this unenhanced exam. Few  punctate nonobstructing calculi are present in both kidneys. No obstructive urolithiasis is seen though there is slight asymmetric prominence of the left renal pelvis and proximal ureter without visible obstructing calculus or lesion. Multiple vascular calcifications are noted as well. Stomach/Bowel: Sliding-type hiatal hernia. Distal stomach and duodenum are unremarkable. No small bowel thickening or dilatation. No colonic dilatation or wall thickening. High attenuation enteric contrast media traverses part way through the transverse colon. No evidence of bowel obstruction. The appendix is not visualized. Correlate for history of appendectomy. No focal inflammation the vicinity of the cecum to suggest an occult appendicitis. Vascular/Lymphatic: Atherosclerotic calcifications within the abdominal aorta and branch vessels. Focal fusiform infrarenal abdominal aortic dilatation to 2.6 cm in diameter (6/47) arising approximately 1 cm below the renal artery ostia. No other aneurysm or ectasia. 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 No other major venous abnormalities. No enlarged abdominopelvic lymph nodes. Reproductive: Normal appearance of the uterus and adnexal structures. Other: No abdominopelvic free fluid or free gas. No bowel containing hernias. Musculoskeletal: Multilevel degenerative changes are present in the imaged portions of the spine. No acute osseous abnormality or suspicious osseous lesion. IMPRESSION: 1. Slight asymmetric prominence in stranding of the left renal pelvis and proximal ureter without visible obstructing calculus or lesion. Findings could reflect recently passed stone or infection. Correlate with patient's symptoms and urinalysis. No obstructive urolithiasis or frank hydronephrosis at this time. 2. Interval development of numerous cysts throughout the kidneys, while many are fluid attenuation, there are several more hyperdense which could reflect  hemorrhagic or proteinaceous cyst. A more conspicuous solid-appearing lesion arising from the upper pole right kidney which measures approximately 2.5 x 2.2 x 2.8 cm, incompletely characterized on this unenhanced exam. Recommend further evaluation with nonemergent renal ultrasound. 3. Sliding-type hiatal hernia. 4. Hypoattenuation of the cardiac blood pool, suggestive of anemia. 5.  Focal infrarenal abdominal aortic dilatation to 2.6 cm. Recommend follow-up ultrasound every 5 years. This recommendation follows ACR consensus guidelines: White Paper of the ACR Incidental Findings Committee II on Vascular Findings. J Am Coll Radiol 2013; 10:789-794. 6. Aortic Atherosclerosis (ICD10-I70.0). These results were called by telephone at the time of interpretation on 12/27/2020 at 12:53 am to provider [facility] , who verbally acknowledged these results. Electronically Signed   By: Lovena Le M.D.   On: 12/27/2020 00:53    EKG: Independently reviewed.  NSR with rate 69; no evidence of acute ischemia   Labs on Admission: I have personally reviewed the available labs and imaging studies at the time of the admission.  Pertinent labs:   CO2 18 Glucose 124 BUN 50/Creatinine 2.10/GFR 24 - uptrending in the last year in a consistent trajectory HS troponin 681, 684, 829 WBC 9.2 Hgb 9.3; 10.1 on 12/23 UA: >300 protein, rare bacteria   Assessment/Plan Principal Problem:   ACS (acute coronary syndrome) (HCC) Active Problems:   Hypertension   GAD (generalized anxiety disorder)   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   COPD (chronic obstructive pulmonary disease) (HCC)   Hyperlipidemia LDL goal <130   CRI (chronic renal insufficiency), stage 4 (severe) (HCC)   PAD (peripheral artery disease) ([place])   Other specified hypothyroidism   ACS/NSTEMI -Patient with substernal chest pain that came on acutely with possible emotional stress (she apparently took an [place] to the drugstore and symptoms  developed there while she was waiting for her DIL to pick her up) -CXR unremarkable.   -Initial HS troponin elevated and continuing to uptrend, concerning for NSTEMI   -EKG with no apparent STEMI. -Will plan to admit on telemetry to further evaluate for ACS given positive enzymes and probable need for further testing/treatment.  -Repeat EKG in AM -Risk factor stratification with HgbA1c and FLP; will also check TSH -Cardiology consultation requested -Patient appears likely to need invasive evaluation (cardiac catheterization) based on concerning history, elevated troponin; however, given her advanced CKD this is precarious and so start with echo to assess for WMA -Given ASA x 1, recently started on Eliquis (currently on Heparin) -NTG for symptom relief (although there is no mortality benefit) -Supplemental O2  Advanced CKD -In review of the last year, her trajectory has been steadily increasing and she is now with stage 4 CKD -Likely needs outpatient nephrology evaluation and possible fistula placement -If she requires cath, it may be detrimental to her kidneys and so this will need to be carefully considered  Recent DVT -Started on Eliquis on 1/7 -It is possible that CP was associated with PE -Hold Eliquis and start Heparin -No CTA due to renal dysfunction so will order VQ scan  Anxiety -Description by daughter was suggestive of panic attack and symptoms improved with Ativan -This may be a contributing factor or the primary diagnosis -Continue Lexapro, Ativan  Renal mass -Seen on CT -Will obtain renal US  HTN -Hold Atenolol based on low-normal BP at the time of admission; will add back now and change to lopressor 25 mg BID -Also hold Olmesartan-Amlodipine-HCTZ - if needed, consider adding back amlodipine monotherapy but would use caution with ARB/HCTZ due to renal disease -Will add prn hydralazine -There was some question of HTN crisis when the patient was brought in, although  this seems less likely given BPs while in the ER  HLD -Continue Crestor -Check lipids  Hypothyroidism -Check TSH -Continue Synthroid at current dose for now  AAA -Infrarenal,  2.6 cm -Needs f/u US in 5 years  COPD with /ho supraglottic cancer with trach -Has chronic alaryngeal voice -Has tracheoesophageal prosthesis with known esophageal dysphagia -Followed at [place] to be stable at this time  PAD -Continue Pletal -Needs outpatient f/u     DVT prophylaxis: Heparin drip Code Status:  Full - confirmed with family Family Communication: None present; I spoke with her daughter-in-law by telephone at the time of admission Disposition Plan:  The patient is from: home  Anticipated d/c is to: home without [facility] services  Anticipated d/c date will depend on clinical response to treatment, but possibly as early as tomorrow if she has excellent response to treatment  Patient is currently: acutely ill Consults called: Cardiology  Admission status: Admit - It is my clinical opinion that admission to [facility] is reasonable and necessary because of the expectation that this patient will require hospital care that crosses at least 2 midnights to treat this condition based on the medical complexity of the problems presented.  Given the aforementioned information, the predictability of an adverse outcome is felt to be significant.    Karmen Bongo MD [facility]   How to contact the [facility] Attending or Consulting provider [place] or covering provider during after hours [place], for this patient?  1. Check the care team in [facility] and look for a) attending/consulting [facility] provider listed and b) the [facility] team listed 2. Log into [email or web address] and use [place]'s universal password to access. If you do not have the password, please contact the hospital operator. 3. Locate the [facility] provider you are looking for under [facility] and page to a number that you can be directly  reached. 4. If you still have difficulty reaching the provider, please page the [facility] (Director on Call) for the Hospitalists listed on amion for assistance.   12/27/2020, 4:45 PM

## 2020-12-27 NOTE — Progress Notes (Signed)
[place] for heparin Indication: chest pain/ACS  Heparin Dosing Weight: 57.6 kg  Labs: Recent Labs    12/26/20 2030 12/27/20 1822  HGB 9.3*  --   HCT 29.4*  --   PLT 227  --   APTT  --  83*  CREATININE 2.10*  --     Assessment: 67 yof with hx of throat cancer s/p treatment with chronic trach, recently diagnosed with RLE DVT 12/20/20 and started on apixaban presenting with flank pain, elevated troponin. Pharmacy consulted to transition to heparin for possible ACS. Last dose of apixaban taken "yesterday" per patient. Ok to start heparin now, as has been >12hrs since last apixaban dose. Hg 9.3, plt wnl, SCr elevated at 2.1 on admit. No active bleed issues documented.  Will monitor heparin using aPTT while apixaban expected to influence heparin level.  APTT is therapeutic this evening at 83. No issues with infusion or bleeding noted.   Goal of Therapy:  Heparin level 0.3-0.7 units/ml aPTT 66-102 seconds Monitor platelets by anticoagulation protocol: Yes   Plan:  Continue heparin at 1000 units/hr Check 8hr heparin level/aPTT Monitor daily heparin level and aPTT until correlating, CBC, s/sx bleeding F/u Cardiology plans, V/Q scan results    Thank you for allowing [place] to participate in this patients care.   Jens Som, PharmD Please see amion for complete clinical pharmacist phone list. 12/27/2020 7:32 PM

## 2020-12-27 NOTE — ED Notes (Signed)
Lunch Tray Ordered @ 1033. 

## 2020-12-27 NOTE — Progress Notes (Addendum)
[place] for heparin Indication: chest pain/ACS  Heparin Dosing Weight: 57.6 kg  Labs: Recent Labs    12/26/20 2030  HGB 9.3*  HCT 29.4*  PLT 227  CREATININE 2.10*    Assessment: 64 yof with hx of throat cancer s/p treatment with chronic trach, recently diagnosed with RLE DVT 12/20/20 and started on apixaban presenting with flank pain, elevated troponin. Pharmacy consulted to transition to heparin for possible ACS. Last dose of apixaban taken "yesterday" per patient. Ok to start heparin now, as has been >12hrs since last apixaban dose. Hg 9.3, plt wnl, SCr elevated at 2.1 on admit. No active bleed issues documented.  Will monitor heparin using aPTT while apixaban expected to influence heparin level.  Goal of Therapy:  Heparin level 0.3-0.7 units/ml aPTT 66-102 seconds Monitor platelets by anticoagulation protocol: Yes   Plan:  No bolus with recent apixaban. Start heparin at 1000 units/hr Check 8hr heparin level/aPTT Monitor daily heparin level and aPTT until correlating, CBC, s/sx bleeding F/u Cardiology plans, V/Q scan results   Arturo Morton, PharmD, BCPS Please check AMION for all [place] contact numbers Clinical Pharmacist 12/27/2020 9:47 AM

## 2020-12-27 NOTE — ED Provider Notes (Signed)
[facility] EMERGENCY DEPARTMENT Provider Note   CSN: [id number] Arrival date & time: 12/26/20  1856     History Chief Complaint  Patient presents with  . Abdominal Pain    [name] is a 75 y.o. female.  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  Patient is primarily Spanish-speaking, very difficult to understand, daughter is at bedside to translate.  The daughter states she has recently been struggling with left buttocks and leg pain, she has been diagnosed with sciatica, she recently had an x-ray of the back for this reason.  Earlier today the patient signaled to the daughter that she was feeling unwell and pointed to her mid abdomen.  Patient shows [place] that she is having abdominal pain that starts around the umbilicus and wraps around bilaterally to the flanks.  She is able to respond appropriately yes/no.  She states the pain does not shoot straight through to her back, does not radiate down into her legs or chest.  She has no chest pain or shortness of breath.  No recent fever or other illness.  Past Medical History:  Diagnosis Date  . Anginal pain ([place])    ?  [name] Anxiety    takes Ativan prn  . Blood transfusion without reported diagnosis 09/15/12   2 units Prbc's  . Broken ribs   . Chronic back pain   . Constipation    related to pain meds  . COPD (chronic obstructive pulmonary disease) ([place]) 08/10/2012   denies  . Depression   . Gastrostomy in place [facility])    removed  . GERD (gastroesophageal reflux disease)    takes Zantac daily  . Headache(784.0)   . Hiatal hernia 08/10/2012  . History of radiation therapy 10/17/12-11/25/12   supraglottic larynx,high risk neck tumor bed 5880 cGy/28 sessions, high risk  lymph node tumor bed 5600 cGy/20 sessions, mod risk lymph node tumor bed 5040 cGy/20 sessions  . Hypercholesteremia    takes Pravastatin daily  . Hypertension    takes Tribenzor and Atenolol daily  . Insomnia    takes Amitriptyline daily  . Nausea    takes Zofran prn  . Pneumonia   . 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 Shortness of breath dyspnea   . Stroke [facility]) 2011   denies. no residual  . Uterine cancer [facility])     Patient Active Problem List   Diagnosis Date Noted  . Other specified hypothyroidism 02/25/2020  . Senile osteoporosis 02/15/2020  . PAD (peripheral artery disease) ([place]) 02/15/2020  . Tinea corporis 02/15/2020  . Gastroesophageal reflux disease without esophagitis 09/09/2019  . Screen for colon cancer 01/19/2019  . Osteopenia 01/19/2019  . Breast cancer screening by mammogram 01/19/2019  . CRI (chronic renal insufficiency), stage 4 (severe) ([place]) 01/19/2019  . Thiamine deficiency 01/02/2018  . Pure hyperglyceridemia 12/29/2017  . Prediabetes 12/28/2017  . Hyperlipidemia LDL goal <130 12/28/2017  . COPD (chronic obstructive pulmonary disease) with chronic bronchitis ([place]) 12/28/2017  . Dietary folate deficiency anemia 12/28/2017  . Moderate episode of recurrent major depressive disorder ([place]) 10/22/2017  . Dysphagia, pharyngoesophageal phase 05/22/2014  . History of radiation therapy   . Chronic pain syndrome 09/03/2012  . Tobacco abuse 08/10/2012  . COPD (chronic obstructive pulmonary disease) ([place]) 08/10/2012  .  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 08/08/2012    Class: Chronic  . Hypertension   . GAD (generalized anxiety disorder)     Past Surgical History:  Procedure Laterality Date  . ABDOMINAL SURGERY     r/t uterine carcinoma  . APPENDECTOMY    . DIRECT LARYNGOSCOPY N/A 05/22/2014   Procedure: DIRECT LARYNGOSCOPY WITH ESOPHAGEAL DILATION;  Surgeon: Jerrell Belfast, MD;  Location: [place];  Service: ENT;   Laterality: N/A;  . ESOPHAGOSCOPY WITH DILITATION N/A 05/29/2015   Procedure: ESOPHAGOSCOPY WITH ESOPHAGEAL DILITATION;  Surgeon: Jerrell Belfast, MD;  Location: [facility] OR;  Service: ENT;  Laterality: N/A;  . Gastrostomy Tube removed   2013  . GASTROSTOMY W/ FEEDING TUBE  12  . GASTROTOMY    . HERNIA REPAIR     child  . LARYNGETOMY  08/31/2012   Procedure: LARYNGECTOMY;  Surgeon: Jerrell Belfast, MD;  Location: [place];  Service: ENT;  Laterality: N/A;  . LARYNGOSCOPY  08/10/2012   Procedure: LARYNGOSCOPY;  Surgeon: Jerrell Belfast, MD;  Location: [facility];  Service: ENT;  Laterality: N/A;  with biopsy  . RADICAL NECK DISSECTION  08/31/2012   Procedure: RADICAL NECK DISSECTION;  Surgeon: Jerrell Belfast, MD;  Location: [place];  Service: ENT;  Laterality: Bilateral;  . TRACHEAL ESOPHOGEAL PUNCTURE WITH REPAIR STOMA N/A 09/08/2013   Procedure: TRACHEAL ESOPHOGEAL PUNCTURE WITH PLACEMENT OF  PROVOX PROSTHESIS ;  Surgeon: Jerrell Belfast, MD;  Location: [place];  Service: ENT;  Laterality: N/A;  . TRACHEOSTOMY TUBE PLACEMENT  08/10/2012   Procedure: TRACHEOSTOMY;  Surgeon: Jerrell Belfast, MD;  Location: [facility];  Service: ENT;  Laterality: N/A;     OB History   No obstetric history on file.     History reviewed. No pertinent family history.  Social History   Tobacco Use  . Smoking status: Former Smoker    Packs/day: 0.25    Years: 50.00    Pack years: 12.50    Types: Cigarettes    Quit date: 05/27/2013    Years since quitting: 7.5  . Smokeless tobacco: Never Used  Substance Use Topics  . Alcohol use: No  . Drug use: No    Home Medications Prior to Admission medications   Medication Sig Start Date End Date Taking? Authorizing Provider  APIXABAN Arne Cleveland) VTE STARTER PACK (10MG  AND 5MG ) Take as directed on package: start with two-5mg  tablets twice daily for 7 days. On day 8, switch to one-5mg  tablet twice daily. 12/20/20   Marrian Salvage, FNP  atenolol (TENORMIN) 25 MG tablet Take 1  tablet (25 mg total) by mouth 2 (two) times daily. 09/19/20   Janith Lima, MD  cilostazol (PLETAL) 100 MG tablet Take 1 tablet (100 mg total) by mouth 2 (two) times daily. 09/19/20   Janith Lima, MD  diphenoxylate-atropine (LOMOTIL) 2.5-0.025 MG tablet Take 1 tablet by mouth 4 (four) times daily as needed for diarrhea or loose stools. 09/19/20   Janith Lima, MD  escitalopram (LEXAPRO) 20 MG tablet Take 1 tablet (20 mg total) by mouth daily. 09/19/20   Janith Lima, MD  famotidine (PEPCID) 40 MG tablet Take 1 tablet (40 mg total) by mouth daily. 09/19/20   Janith Lima, MD  fluticasone [facility]) 50 MCG/ACT nasal spray SPRAY 2 SPRAYS INTO EACH NOSTRIL EVERY DAY 06/10/20   Raylene Everts, MD  folic acid (FOLVITE) 1 MG tablet Take 1 tablet (1 mg total) by mouth daily. 09/19/20   Janith Lima, MD  levothyroxine (SYNTHROID) 50 MCG tablet Take 1 tablet (50 mcg total) by mouth daily before breakfast. 12/20/20   Marrian Salvage, FNP  LORazepam (ATIVAN) 1 MG tablet Take 1 tablet (1 mg total) by mouth daily. 09/19/20   Janith Lima, MD  Olmesartan-amLODIPine-HCTZ 20-5-12.5 MG TABS Take 1 tablet by mouth daily. 09/19/20   Janith Lima, MD  rosuvastatin (CRESTOR) 20 MG tablet Take 1 tablet (20 mg total) by mouth daily. 09/19/20   Janith Lima, MD  thiamine (VITAMIN B-1) 100 MG tablet Take 1 tablet (100 mg total) by mouth daily. 09/19/20   Janith Lima, MD  levocetirizine (XYZAL) 5 MG tablet TAKE 1 TABLET(5 MG) BY MOUTH EVERY EVENING 02/21/20 06/10/20  Janith Lima, MD    Allergies    Xyzal [levocetirizine dihydrochloride]  Review of Systems   Review of Systems  Unable to perform ROS: Patient nonverbal    Physical Exam Updated Vital Signs BP (!) 130/55   Pulse 67   Temp 98.3 F (36.8 C)   Resp 12   SpO2 96%   Physical Exam Vitals and nursing note reviewed.  Constitutional:      Appearance: Normal appearance.  HENT:     Head: Normocephalic.     Mouth/Throat:      Comments: Tracheostomy site Cardiovascular:     Rate and Rhythm: Normal rate.  Pulmonary:     Effort: Pulmonary effort is normal. No respiratory distress.  Abdominal:     Palpations: Abdomen is soft.     Tenderness: There is abdominal tenderness.     Comments: Mild periumbilical tenderness and some lower quadrant tenderness without any guarding, abdomen is not distended  Skin:    General: Skin is warm.  Neurological:     Mental Status: She is alert and oriented to person, place, and time. Mental status is at baseline.  Psychiatric:        Mood and Affect: Mood normal.     ED Results / Procedures / Treatments   Labs (all labs ordered are listed, but only abnormal results are displayed) Labs Reviewed  CBC WITH DIFFERENTIAL/PLATELET - Abnormal; Notable for the following components:      Result Value   RBC 3.28 (*)    Hemoglobin 9.3 (*)    HCT 29.4 (*)    Abs Immature Granulocytes 0.11 (*)    All other components within normal limits  COMPREHENSIVE METABOLIC PANEL - Abnormal; Notable for the following components:   CO2 18 (*)    Glucose, Bld 124 (*)    BUN 50 (*)    Creatinine, Ser 2.10 (*)    Total Protein 6.2 (*)    GFR, Estimated 24 (*)    All other components within normal limits  URINALYSIS, ROUTINE W REFLEX MICROSCOPIC    EKG EKG Interpretation  Date/Time:  Thursday December 26 2020 19:33:21 EST Ventricular Rate:  69 PR Interval:    QRS Duration: 92 QT Interval:  436 QTC Calculation: 468 R Axis:   43 Text Interpretation: Sinus rhythm Anteroseptal infarct, age indeterminate NSR, no STEMI Confirmed by Lavenia Atlas [phone]) on 12/26/2020 8:29:35 PM   Radiology No results found.  Procedures Procedures (including critical care time)  Medications Ordered in ED Medications  iohexol (OMNIPAQUE) 9 MG/ML oral solution (has no administration in time range)  iohexol (OMNIPAQUE) 9 MG/ML oral solution 500 mL (has no administration in time range)  morphine 2 MG/ML  injection 2 mg (2 mg Intravenous Given 12/26/20 2022)  ED Course  I have reviewed the triage vital signs and the nursing notes.  Pertinent labs & imaging results that were available during my care of the patient were reviewed by me and considered in my medical decision making (see chart for details).    MDM Rules/Calculators/A&P                          75 year old female presents the emergency department with abdominal pain.  Was noted to be hypertensive with EMS, she is normotensive on arrival.  Patient is essentially nonverbal, has a daughter at bedside to help aid in history.  She points to her mid abdomen with radiation to her bilateral flanks.  Some tenderness on abdominal exam without any acute findings.  Blood work is reassuring, no leukocytosis, baseline anemia, slightly worse than normal AKI, patient has not been able to urinate.  EKG shows no ischemic changes.  On her lumbar x-ray that she had done earlier for her sciatica they mention possible renal stones, this could be the source of her discomfort.  The other consideration with hypertension, abdominal/back pain would be a vascular abnormality.  She has equal perfusion in the lower extremities, I have lower suspicion for this at this time.  Due to her GFR we will start with a CT of the abdomen pelvis without, she has been given medication for pain and on reevaluation is resting comfortably.  Patient signed out pending CAT scan and further evaluation.  Stable at time of signout.  Final Clinical Impression(s) / ED Diagnoses Final diagnoses:  None    Rx / DC Orders ED Discharge Orders    None       Lorelle Gibbs, DO 12/27/20 0013

## 2020-12-27 NOTE — Progress Notes (Signed)
Admission from the ED awake and alert. Speaks only spanish a little of [place].

## 2020-12-27 NOTE — Progress Notes (Signed)
  Echocardiogram 2D Echocardiogram has been reattempted. Patient in Nuclear Medicine.   Tiffany G Dance 12/27/2020, 1:47 PM

## 2020-12-27 NOTE — Consult Note (Addendum)
Cardiology Consultation:   Patient ID: RENN STILLE MRN: [id number]; DOB: Sep 30, 1946  Admit date: 12/26/2020 Date of Consult: 12/27/2020  Primary Care Provider: Janith Lima, MD [facility] Cardiologist: Mertie Moores, MD  [facility] Electrophysiologist:  None    Patient Profile:   [name] is a 75 y.o. 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 Leonette Monarch.  History of Present Illness:   History is obtained from the chart, talking to Dr. Lorin Mercy and her daughter-in-law [name] whose number is listed in the chart. [name] was here earlier but has since had to leave. Patient has trache in place but per nurse does not currently have her voice box to speak with (but daughter in law states she has been with the patient long enough to be able to read her lips). The patient has no known cardiac history but did have abnormal noninvasive PV imaging in 2016 and has been on Pletal through PCP. Per notes, recently seen by PCP 12/23 for episode of shaking, weakness, fatigue and near-syncope. Her blood pressure was high and CBG 118. EMS went through her meds and they said that she was mistakingly given a diabetic medication from the pharmacy despite not having DM. She was feeling back to normal at that visit. On 12/20/20 she was seen for right calf pain and found to have DVT on LE venous duplex so started on Eliquis. Yes[facility] and called [name] to come pick her up. When [name] arrived, the patient was shaking, crying, and saying her face was hard and she couldn't move it. She also had a hard time  moving her arms and had to be helped into the car. They stopped driving and EMS was called. At that time she was found to have O2 sat of 73% by EMS run sheet and BP 240/100. O2 was applied to stoma. She was also having substernal chest pain, abdominal pain, and hyperventilating from the situation. (Per [name] she does not usually complain of chest pain.) EMS helped her into the ambulance and gave her a lorazepam and symptoms eased off fairly quickly. Total duration of CP ~3 minutes per [name]. BP continued to improve to 178 en route -> 168 in the hospital and continued to normalize since then. When seen in the ED currently she is able to answer yes/no questions by shaking head and denies any complaints of CP, SOB or abdominal pain but does indicate some pain in her left kneecap. No swelling or labored breathing reported. Labs show hsTroponin 681-684, Cr 2.1 (previously 1.7-2.0 in 2021), Hgb 9.3 (previously 10.1). CT renal stone showed asymmetric prominence in stranding of the left renal pelvis and proximal ureter without visible obstructing calculus/lesion (could reflect recent passed stone or infection), interval development of numerous renal cysts, sliding hiatal hernia, hypoattenuation of the cardiac blood pool suggesting anemia, infrarenal abdominal aortic dilation to 2.6cm, aortic atherosclerosis. Primary team is switching to heparin for now and obtaining VQ to evaluate for PE.    Past Medical History:  Diagnosis Date  . Anemia   . Anxiety    takes Ativan prn  . Blood transfusion without reported diagnosis 09/15/12  2 units Prbc's  . Broken ribs   . Chronic back pain   . CKD (chronic kidney disease), stage IV ([place])   . Constipation    related to pain meds  . COPD (chronic obstructive pulmonary disease) ([place]) 08/10/2012   denies  . Depression   . Gastrostomy in place [facility])    removed  . GERD (gastroesophageal reflux disease)    takes Zantac daily  . Headache(784.0)   . Hiatal hernia  08/10/2012  . History of radiation therapy 10/17/12-11/25/12   supraglottic larynx,high risk neck tumor bed 5880 cGy/28 sessions, high risk lymph node tumor bed 5600 cGy/20 sessions, mod risk lymph node tumor bed 5040 cGy/20 sessions  . Hypercholesteremia    takes Pravastatin daily  . Hypertension    takes Tribenzor and Atenolol daily  . Insomnia    takes Amitriptyline daily  . Nausea    takes Zofran prn  . PAD (peripheral artery disease) ([place])    noninvasive imaging in 2016  . Pneumonia   . 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 Shortness of breath dyspnea   . Stroke [facility]) 2011   denies. no residual  . Uterine cancer [facility])     Past Surgical History:  Procedure Laterality Date  . ABDOMINAL SURGERY     r/t uterine carcinoma  . APPENDECTOMY    . DIRECT LARYNGOSCOPY N/A 05/22/2014   Procedure: DIRECT LARYNGOSCOPY WITH ESOPHAGEAL DILATION;  Surgeon: Jerrell Belfast, MD;  Location: [place];  Service: ENT;  Laterality: N/A;  . ESOPHAGOSCOPY WITH DILITATION N/A 05/29/2015   Procedure: ESOPHAGOSCOPY WITH ESOPHAGEAL DILITATION;  Surgeon: Jerrell Belfast, MD;  Location: [facility] OR;  Service: ENT;  Laterality: N/A;  . Gastrostomy Tube removed   2013  . GASTROSTOMY W/ FEEDING TUBE  13  . GASTROTOMY    . HERNIA REPAIR     child  . LARYNGETOMY  08/31/2012   Procedure: LARYNGECTOMY;  Surgeon: Jerrell Belfast, MD;  Location: [place];  Service: ENT;  Laterality: N/A;  . LARYNGOSCOPY  08/10/2012   Procedure: LARYNGOSCOPY;  Surgeon: Jerrell Belfast, MD;  Location: [facility];  Service: ENT;  Laterality: N/A;  with biopsy  . RADICAL NECK DISSECTION  08/31/2012   Procedure: RADICAL NECK DISSECTION;  Surgeon: Jerrell Belfast, MD;  Location: [place];  Service: ENT;  Laterality: Bilateral;  . TRACHEAL ESOPHOGEAL PUNCTURE WITH REPAIR STOMA N/A 09/08/2013   Procedure: TRACHEAL ESOPHOGEAL PUNCTURE WITH PLACEMENT OF  PROVOX PROSTHESIS ;  Surgeon: Jerrell Belfast, MD;  Location: [place];  Service: ENT;   Laterality: N/A;  . TRACHEOSTOMY TUBE PLACEMENT  08/10/2012   Procedure: TRACHEOSTOMY;  Surgeon: Jerrell Belfast, MD;  Location: [facility];  Service: ENT;  Laterality: N/A;     Home Medications:  Prior to Admission medications   Medication Sig Start Date End Date Taking? Authorizing Provider  APIXABAN Arne Cleveland) VTE STARTER PACK (10MG  AND 5MG ) Take as directed on package: start with two-5mg  tablets twice daily for 7 days. On day 8, switch to one-5mg  tablet twice daily. 12/20/20   Marrian Salvage, FNP  atenolol (TENORMIN) 25 MG tablet Take 1 tablet (25 mg total) by mouth 2 (two) times daily. 09/19/20   Janith Lima, MD  cilostazol (PLETAL) 100 MG tablet Take 1 tablet (100 mg total) by mouth 2 (two) times daily. 09/19/20   Janith Lima, MD  diphenoxylate-atropine (LOMOTIL) 2.5-0.025 MG tablet Take 1 tablet by mouth 4 (four) times daily as needed for diarrhea or loose stools. 09/19/20  Janith Lima, MD  escitalopram (LEXAPRO) 20 MG tablet Take 1 tablet (20 mg total) by mouth daily. 09/19/20   Janith Lima, MD  famotidine (PEPCID) 40 MG tablet Take 1 tablet (40 mg total) by mouth daily. 09/19/20   Janith Lima, MD  fluticasone [facility]) 50 MCG/ACT nasal spray SPRAY 2 SPRAYS INTO EACH NOSTRIL EVERY DAY 06/10/20   Raylene Everts, MD  folic acid (FOLVITE) 1 MG tablet Take 1 tablet (1 mg total) by mouth daily. 09/19/20   Janith Lima, MD  levothyroxine (SYNTHROID) 50 MCG tablet Take 1 tablet (50 mcg total) by mouth daily before breakfast. 12/20/20   Marrian Salvage, FNP  LORazepam (ATIVAN) 1 MG tablet Take 1 tablet (1 mg total) by mouth daily. 09/19/20   Janith Lima, MD  Olmesartan-amLODIPine-HCTZ 20-5-12.5 MG TABS Take 1 tablet by mouth daily. 09/19/20   Janith Lima, MD  rosuvastatin (CRESTOR) 20 MG tablet Take 1 tablet (20 mg total) by mouth daily. 09/19/20   Janith Lima, MD  thiamine (VITAMIN B-1) 100 MG tablet Take 1 tablet (100 mg total) by mouth daily. 09/19/20   Janith Lima, MD  levocetirizine (XYZAL) 5 MG tablet TAKE 1 TABLET(5 MG) BY MOUTH EVERY EVENING 02/21/20 06/10/20  Janith Lima, MD    Inpatient Medications: Scheduled Meds: . aspirin  324 mg Oral NOW   Or  . aspirin  300 mg Rectal NOW  . cilostazol  100 mg Oral BID  . escitalopram  20 mg Oral Daily  . famotidine  40 mg Oral Daily  . fluticasone  2 spray Each Nare Daily  . folic acid  1 mg Oral Daily  . iohexol      . [START ON 12/28/2020] levothyroxine  50 mcg Oral QAC breakfast  . LORazepam  1 mg Oral Daily  . rosuvastatin  20 mg Oral Daily  . sodium chloride flush  3 mL Intravenous Q12H  . thiamine  100 mg Oral Daily   Continuous Infusions: . sodium chloride     PRN Meds: sodium chloride, acetaminophen **OR** acetaminophen, calcium carbonate (dosed in mg elemental calcium), camphor-menthol **AND** hydrOXYzine, diphenoxylate-atropine, docusate sodium, feeding supplement (NEPRO CARB STEADY), nitroGLYCERIN, ondansetron **OR** ondansetron (ZOFRAN) IV, sodium chloride flush, sorbitol, zolpidem  Allergies:    Allergies  Allergen Reactions  . Xyzal [Levocetirizine Dihydrochloride] Itching    Social History:   Social History   Socioeconomic History  . Marital status: Widowed    Spouse name: Not on file  . Number of children: 4  . Years of education: Not on file  . Highest education level: Not on file  Occupational History    Comment: retired Regulatory affairs officer  Tobacco Use  . Smoking status: Former Smoker    Packs/day: 0.25    Years: 50.00    Pack years: 12.50    Types: Cigarettes    Quit date: 05/27/2013    Years since quitting: 7.5  . Smokeless tobacco: Never Used  Substance and Sexual Activity  . Alcohol use: No  . Drug use: No  . Sexual activity: Never  Other Topics Concern  . Not on file  Social History Narrative  . Not on file   Social Determinants of Health   Financial Resource Strain: Not on file  Food Insecurity: Not on file  Transportation Needs: Not on  file  Physical Activity: Not on file  Stress: Not on file  Social Connections: Not on file  Intimate Partner Violence: Not  on file    Family History:   Unable to currently assess due to issue with patient not having voice box with her and family unavailable  ROS:  Limited for same reasons as fam hx  Physical Exam/Data:   Vitals:   12/27/20 0615 12/27/20 0730 12/27/20 0822 12/27/20 0832  BP: (!) 131/52 122/63  (!) 110/58  Pulse: 69 64  65  Resp: 17 15  17   Temp:   98.7 F (37.1 C)   TempSrc:   Oral   SpO2: 91% 93%  95%   No intake or output data in the 24 hours ending 12/27/20 0917 Last 3 Weights 12/20/2020 12/05/2020 09/19/2020  Weight (lbs) 127 lb 127 lb 127 lb  Weight (kg) 57.607 kg 57.607 kg 57.607 kg     There is no height or weight on file to calculate BMI.  Vital Signs. BP (!) 110/58   Pulse 65   Temp 98.7 F (37.1 C) (Oral)   Resp 17   SpO2 95%  General: Well developed, well nourished F in no acute distress. Head: Normocephalic, atraumatic, sclera non-icteric, no xanthomas, nares are without discharge. Tracheostomy in place Neck: Negative for carotid bruits. JVP not elevated. Lungs: Clear bilaterally to auscultation without wheezes, rales, or rhonchi. Breathing is unlabored. Heart: RRR S1 S2 without murmurs, rubs, or gallops.  Abdomen: Soft, non-tender, non-distended with normoactive bowel sounds. No rebound/guarding. Extremities: No clubbing or cyanosis. No edema. Distal pedal pulses are 2+ and equal bilaterally. Brisk UE pulses equal as well. Neuro: Alert and oriented X 3. Moves all extremities spontaneously. Psych:  Responds to questions appropriately with a normal affect.   EKG:  The EKG was personally reviewed and demonstrates:  NSR 69bpm anteroseptal infarct, nonspecific STT changes including V2  Telemetry:  Telemetry was personally reviewed and demonstrates: NSR  Relevant CV Studies: N/A  Laboratory Data:  High Sensitivity Troponin:   Recent Labs   Lab 12/27/20 0203 12/27/20 0349  TROPONINIHS 681* 684*     Chemistry Recent Labs  Lab 12/26/20 2030  NA 139  K 4.9  CL 109  CO2 18*  GLUCOSE 124*  BUN 50*  CREATININE 2.10*  CALCIUM 8.9  GFRNONAA 24*  ANIONGAP 12    Recent Labs  Lab 12/26/20 2030  PROT 6.2*  ALBUMIN 3.5  AST 21  ALT 19  ALKPHOS 53  BILITOT 0.3   Hematology Recent Labs  Lab 12/26/20 2030  WBC 9.2  RBC 3.28*  HGB 9.3*  HCT 29.4*  MCV 89.6  MCH 28.4  MCHC 31.6  RDW 13.0  PLT 227   BNPNo results for input(s): BNP, PROBNP in the last 168 hours.  DDimer No results for input(s): DDIMER in the last 168 hours.   Radiology/Studies:  CT Renal Stone Study  Result Date: 12/27/2020 CLINICAL DATA:  Flank pain, stone disease suspected EXAM: CT ABDOMEN AND PELVIS WITHOUT CONTRAST TECHNIQUE: Multidetector CT imaging of the abdomen and pelvis was performed following the standard protocol without IV contrast. COMPARISON:  CT 05/03/2003 FINDINGS: Lower chest: Lung bases are clear. Normal heart size. No pericardial effusion. Few calcifications on the mitral annulus. Coronary artery calcifications and distal thoracic aortic calcifications are present as well. Hypoattenuation of the cardiac blood pool, suggestive of anemia. Hepatobiliary: No visible focal liver lesion within limitations of this unenhanced CT. Normal liver attenuation. Smooth liver surface contour. Normal gallbladder and biliary tree without visible calcified gallstone. Pancreas: No pancreatic ductal dilatation or surrounding inflammatory changes. Spleen: Normal in size. No concerning splenic  lesions. Punctate calcification in the spleen may be vascular or related to prior granulomatous disease (3/22) normal splenic size. No worrisome splenic lesions. Adrenals/Urinary Tract: Normal adrenal glands. Since 2004, there has been development of numerous cysts throughout the kidneys, while many are fluid attenuation, there are several more hyperdense cysts  including a 10 mm partially exophytic cyst in the interpolar right kidney (3/35 and a cyst with some layering hyperattenuation in the upper pole left kidney measuring 1.8 cm (3/27). Additionally, there is a more conspicuous almost solid-appearing lesion arising from the upper pole right kidney which measures approximately 2.5 x 2.2 x 2.8 cm, incompletely characterized on this unenhanced exam. Few punctate nonobstructing calculi are present in both kidneys. No obstructive urolithiasis is seen though there is slight asymmetric prominence of the left renal pelvis and proximal ureter without visible obstructing calculus or lesion. Multiple vascular calcifications are noted as well. Stomach/Bowel: Sliding-type hiatal hernia. Distal stomach and duodenum are unremarkable. No small bowel thickening or dilatation. No colonic dilatation or wall thickening. High attenuation enteric contrast media traverses part way through the transverse colon. No evidence of bowel obstruction. The appendix is not visualized. Correlate for history of appendectomy. No focal inflammation the vicinity of the cecum to suggest an occult appendicitis. Vascular/Lymphatic: Atherosclerotic calcifications within the abdominal aorta and branch vessels. Focal fusiform infrarenal abdominal aortic dilatation to 2.6 cm in diameter (6/47) arising approximately 1 cm below the renal artery ostia. No other aneurysm or ectasia. 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 No other major venous abnormalities. No enlarged abdominopelvic lymph nodes. Reproductive: Normal appearance of the uterus and adnexal structures. Other: No abdominopelvic free fluid or free gas. No bowel containing hernias. Musculoskeletal: Multilevel degenerative changes are present in the imaged portions of the spine. No acute osseous abnormality or suspicious osseous lesion. IMPRESSION: 1. Slight asymmetric prominence in stranding of the left renal pelvis and  proximal ureter without visible obstructing calculus or lesion. Findings could reflect recently passed stone or infection. Correlate with patient's symptoms and urinalysis. No obstructive urolithiasis or frank hydronephrosis at this time. 2. Interval development of numerous cysts throughout the kidneys, while many are fluid attenuation, there are several more hyperdense which could reflect hemorrhagic or proteinaceous cyst. A more conspicuous solid-appearing lesion arising from the upper pole right kidney which measures approximately 2.5 x 2.2 x 2.8 cm, incompletely characterized on this unenhanced exam. Recommend further evaluation with nonemergent renal ultrasound. 3. Sliding-type hiatal hernia. 4. Hypoattenuation of the cardiac blood pool, suggestive of anemia. 5. Focal infrarenal abdominal aortic dilatation to 2.6 cm. Recommend follow-up ultrasound every 5 years. This recommendation follows ACR consensus guidelines: White Paper of the ACR Incidental Findings Committee II on Vascular Findings. J Am Coll Radiol 2013; 10:789-794. 6. Aortic Atherosclerosis (ICD10-I70.0). These results were called by telephone at the time of interpretation on 12/27/2020 at 12:53 am to provider [facility] , who verbally acknowledged these results. Electronically Signed   By: Lovena Le M.D.   On: 12/27/2020 00:53     Assessment and Plan:   1. Multiple symptoms including lightheadedness, numbness of her hands, face, shortness of breath and possible chest pain, with potential concomitant anxiety attack and significantly elevated BP - difficult to know chicken or the egg here, possible concomitant panic attack - troponin is elevated at 681 - 684, transient chest pain reported (3 minutes total per daughter in law) - would cycle another value to see if downtrending yet - check echocardiogram - agree with  changing Eliquis to heparin while this is investigated  - aortic dissection seems less likely given complete resolution  of pain, equal pulses - there does appear to be BP differential (134/48 R, 104/45 L) so will obtain CXR to eval mediastinal contours. Lower chest was unremarkable on CT. Preliminarily discussed with MD - agree with VQ which will help [place] understand echo findings if there are any RV changes - incidental findings as noted on CT scan, no abdominal aortic dissection seen - lipids pending - will review EKG and clinical findings and relay further recs with MD  2. Hypertensive urgency - resolved with Ativan - follow and gradually add back home regimen as appropriate  3. AKI on CKD stage IV - home olmesartan-amlodipine-HCTZ on hold for now (can resume amlodipine if BP will allow) - follow - avoid contrasted studies for now  4. Acute on chronic anemia - per IM - may be due in part to renal disease  5. PAD - duplex 2016: Aorto-iliac disease, based on abnormal common femoral artery waveforms. Moderately decreased right ABI. Severely decreased left ABI. Abnormal great toe-brachial indices, bilaterally - infrarenal aortic dilation can be followed as OP - on Pletal as OP, can hold inpatient if needed given anemia      HEAR Score (for undifferentiated chest pain):    unable to fully assess all questions due to patient not being able to communicate well  For questions or updates, please contact [place] Please consult [email or web address] for contact info under    Signed, Charlie Pitter, PA-C  12/27/2020 9:17 AM   Attending Note:   The patient was seen and examined.  Agree with assessment and plan as noted above.  Changes made to the above note as needed.  Patient seen and independently examined with Melina Copa, PA .   We discussed all aspects of the encounter. I agree with the assessment and plan as stated above.  1.  Elevated troponin:   Difficult to sort out.   Trend is flat so far.  Will hold eliquis for now - start heparin in case we need to cath  Echo - prelim report : Normal LV  systolic function,  Grade 1 DD, trace PI  ? Coronary spasm  I doubt this is an aortic dissection or SCAD given the speed that the CP resolved with ativan. Consider lexiscan myoview for further eval     2.  Anxiety:  Her symptoms resolved with ativan Further eval per primary team    3.  Htn:  bp is better . Would cont home meds    -    I have spent a total of 40 minutes with patient reviewing hospital  notes , telemetry, EKGs, labs and examining patient as well as establishing an assessment and plan that was discussed with the patient. > 50% of time was spent in direct patient care.    Thayer Headings, Brooke Bonito., MD, [facility] 12/27/2020, 3:33 PM [address]  [place] Pager [phone]

## 2020-12-27 NOTE — Progress Notes (Signed)
As per daughter -in law pt baseline systolic blood pressure is 160-170.

## 2020-12-27 NOTE — ED Provider Notes (Addendum)
I assumed care of this patient.  Please see previous provider note for further details of Hx, PE.  Briefly patient is a 75 y.o. female with a history of throat cancer status post treatment with chronic tracheostomy who recently diagnosed with a right lower extremity DVT and just started Eliquis who presented mid and bilateral flank abdominal pain.   Initial work-up was reassuring currently pending CT of the abdomen.  On my assessment [facility] consisted of lightheadedness, facial contraction, bilateral upper and lower extremity tingling and difficulty following command.  Patient is accompanied by her daughter-in-law who reported that she was unable to move her face or any extremity during this episode.  She was also slow to respond.  They noted that her blood pressure was elevated with systolics in the 229N.  911 called at that time.  Patient was given Ativan for possible anxiety.  The patient does not believe that this was a panic attack.  Her blood pressure does appear to be improving and her symptoms have improved.  They denied any focal deficits.  CT scan revealed possible past renal stone and multiple bilateral renal cyst.  There was a concerning lesion on the kidneys that will need to be further evaluated as outpatient for neoplasm.  Patient and family were made aware of this.  Given the reported episode above work-up was expanded to include troponin which was elevated greater than 600.  Given her symptoms above, this likely that the patient had an episodic hypertensive crisis. BP stable currently. Other possibilities include PE from her known DVT, though patient is not hypoxic and her blood pressure was elevated and hypotensive; making this less likely. Also considering ACS. Possible stress induced cardiomyopathy as well.  Patient denies any current chest pain or shortness of breath.  She was admitted to medicine for further work-up and management. Cardiology  consulted who will evaluate the patient during admission.  [name]1-3 Lead EKG Interpretation Performed by: Fatima Blank, MD Authorized by: Fatima Blank, MD     Interpretation: normal     ECG rate:  68   ECG rate assessment: normal     Rhythm: sinus rhythm     Ectopy: none     Conduction: normal   .Critical Care Performed by: Fatima Blank, MD Authorized by: Fatima Blank, MD   Critical care provider statement:    Critical care time (minutes):  45   Critical care was time spent personally by me on the following activities:  Discussions with consultants, evaluation of patient's response to treatment, examination of patient, ordering and performing treatments and interventions, ordering and review of laboratory studies, ordering and review of radiographic studies, pulse oximetry, re-evaluation of patient's condition, obtaining history from patient or surrogate and review of old charts          Cardama, Grayce Sessions, MD 12/27/20 [phone]

## 2020-12-27 NOTE — Telephone Encounter (Signed)
Called pt, LVM.   

## 2020-12-28 DIAGNOSIS — R778 Other specified abnormalities of plasma proteins: Secondary | ICD-10-CM | POA: Diagnosis not present

## 2020-12-28 LAB — CBC
HCT: 28.5 % — ABNORMAL LOW (ref 36.0–46.0)
Hemoglobin: 9.3 g/dL — ABNORMAL LOW (ref 12.0–15.0)
MCH: 28.5 pg (ref 26.0–34.0)
MCHC: 32.6 g/dL (ref 30.0–36.0)
MCV: 87.4 fL (ref 80.0–100.0)
Platelets: 211 10*3/uL (ref 150–400)
RBC: 3.26 MIL/uL — ABNORMAL LOW (ref 3.87–5.11)
RDW: 13 % (ref 11.5–15.5)
WBC: 6.6 10*3/uL (ref 4.0–10.5)
nRBC: 0 % (ref 0.0–0.2)

## 2020-12-28 LAB — MRSA PCR SCREENING: MRSA by PCR: NEGATIVE

## 2020-12-28 LAB — BASIC METABOLIC PANEL
Anion gap: 10 (ref 5–15)
BUN: 45 mg/dL — ABNORMAL HIGH (ref 8–23)
CO2: 22 mmol/L (ref 22–32)
Calcium: 9 mg/dL (ref 8.9–10.3)
Chloride: 108 mmol/L (ref 98–111)
Creatinine, Ser: 1.98 mg/dL — ABNORMAL HIGH (ref 0.44–1.00)
GFR, Estimated: 26 mL/min — ABNORMAL LOW (ref >60)
Glucose, Bld: 92 mg/dL (ref 70–99)
Potassium: 4.4 mmol/L (ref 3.5–5.1)
Sodium: 140 mmol/L (ref 135–145)

## 2020-12-28 LAB — TROPONIN I (HIGH SENSITIVITY): Troponin I (High Sensitivity): 906 ng/L (ref <18)

## 2020-12-28 LAB — APTT: aPTT: 118 seconds — ABNORMAL HIGH (ref 24–36)

## 2020-12-28 LAB — PROTIME-INR
INR: 1.2 (ref 0.8–1.2)
Prothrombin Time: 14.4 seconds (ref 11.4–15.2)

## 2020-12-28 LAB — HEPARIN LEVEL (UNFRACTIONATED): Heparin Unfractionated: 1.52 IU/mL — ABNORMAL HIGH (ref 0.30–0.70)

## 2020-12-28 MED ORDER — ASPIRIN 300 MG RE SUPP
300.0000 mg | RECTAL | Status: DC
  Filled 2020-12-28: qty 1

## 2020-12-28 MED ORDER — METOPROLOL TARTRATE 5 MG/5ML IV SOLN
INTRAVENOUS | Status: AC
  Administered 2020-12-28: 2.5 mg via INTRAVENOUS
  Filled 2020-12-28: qty 5

## 2020-12-28 MED ORDER — MORPHINE SULFATE (PF) 2 MG/ML IV SOLN
1.0000 mg | INTRAVENOUS | Status: DC | PRN
  Administered 2020-12-30 (×2): 1 mg via INTRAVENOUS
  Filled 2020-12-28: qty 1

## 2020-12-28 MED ORDER — ASPIRIN 81 MG PO CHEW: 81.0000 mg | CHEWABLE_TABLET | ORAL | Status: DC

## 2020-12-28 MED ORDER — ASPIRIN EC 81 MG PO TBEC
81.0000 mg | DELAYED_RELEASE_TABLET | Freq: Every day | ORAL | Status: DC
  Administered 2020-12-28 – 2021-01-01 (×5): 81 mg via ORAL
  Filled 2020-12-28 (×5): qty 1

## 2020-12-28 MED ORDER — HYDROCODONE-ACETAMINOPHEN 5-325 MG PO TABS
1.0000 | ORAL_TABLET | ORAL | Status: DC | PRN
  Administered 2020-12-28 – 2021-01-01 (×6): 1 via ORAL
  Filled 2020-12-28 (×7): qty 1

## 2020-12-28 MED ORDER — METOPROLOL TARTRATE 5 MG/5ML IV SOLN: 2.5000 mg | Freq: Once | INTRAVENOUS | Status: AC

## 2020-12-28 MED ORDER — METOPROLOL TARTRATE 5 MG/5ML IV SOLN
INTRAVENOUS | Status: AC
  Administered 2020-12-28: 5 mg
  Filled 2020-12-28: qty 5

## 2020-12-28 MED ORDER — METOPROLOL TARTRATE 50 MG PO TABS
50.0000 mg | ORAL_TABLET | Freq: Two times a day (BID) | ORAL | Status: DC
  Administered 2020-12-28 – 2021-01-02 (×10): 50 mg via ORAL
  Filled 2020-12-28 (×10): qty 1

## 2020-12-28 NOTE — Progress Notes (Signed)
Subjective:  Denies SSCP, palpitations or Dyspnea Understands her chemical stress test will be in am   Objective:  Vitals:   12/27/20 2057 12/27/20 2331 12/28/20 0301 12/28/20 0751  BP: (!) 175/58 (!) 141/57 131/62 138/63  Pulse: 72 65 73 74  Resp: 20 15 20 17   Temp: 99 F (37.2 C) 98.5 F (36.9 C) 98.6 F (37 C) 98.5 F (36.9 C)  TempSrc: Oral Oral Oral Oral  SpO2: 97% 94% 96% 97%  Weight:      Height:        Intake/Output from previous day:  Intake/Output Summary (Last 24 hours) at 12/28/2020 0848 Last data filed at 12/28/2020 0559 Gross per 24 hour  Intake 159.23 ml  Output --  Net 159.23 ml    Physical Exam: Chronically ill Tracheostomy in place Lungs clear No murmur  No edema   Lab Results: Basic Metabolic Panel: Recent Labs    12/26/20 2030 12/28/20 0028  NA 139 140  K 4.9 4.4  CL 109 108  CO2 18* 22  GLUCOSE 124* 92  BUN 50* 45*  CREATININE 2.10* 1.98*  CALCIUM 8.9 9.0   Liver Function Tests: Recent Labs    12/26/20 2030  AST 21  ALT 19  ALKPHOS 53  BILITOT 0.3  PROT 6.2*  ALBUMIN 3.5   No results for input(s): LIPASE, AMYLASE in the last 72 hours. CBC: Recent Labs    12/26/20 2030 12/28/20 0028  WBC 9.2 6.6  NEUTROABS 7.1  --   HGB 9.3* 9.3*  HCT 29.4* 28.5*  MCV 89.6 87.4  PLT 227 211   Cardiac Enzymes: No results for input(s): CKTOTAL, CKMB, CKMBINDEX, TROPONINI in the last 72 hours. BNP: Invalid input(s): POCBNP D-Dimer: No results for input(s): DDIMER in the last 72 hours. Hemoglobin A1C: Recent Labs    12/27/20 0911  HGBA1C 5.2   Fasting Lipid Panel: Recent Labs    12/27/20 0912  CHOL 177  HDL 35*  LDLCALC 73  TRIG 346*  CHOLHDL 5.1   Thyroid Function Tests: Recent Labs    12/27/20 0911  TSH 6.833*   Anemia Panel: No results for input(s): VITAMINB12, FOLATE, FERRITIN, TIBC, IRON, RETICCTPCT in the last 72 hours.  Imaging: DG Chest 2 View  Result Date: 12/27/2020 CLINICAL DATA:  Cough, fever  EXAM: CHEST - 2 VIEW COMPARISON:  10/30/2015 FINDINGS: The heart size and mediastinal contours are within normal limits. Atherosclerotic calcification of the aortic knob. No focal airspace consolidation, pleural effusion, or pneumothorax. The visualized skeletal structures are unremarkable. IMPRESSION: No active cardiopulmonary disease. Electronically Signed   By: Davina Poke D.O.   On: 12/27/2020 11:51   US RENAL  Result Date: 12/27/2020 CLINICAL DATA:  Renal mass EXAM: RENAL / URINARY TRACT ULTRASOUND COMPLETE COMPARISON:  Same day CT abdomen pelvis, 12:34 a.m. FINDINGS: Right Kidney: Renal measurements: 9.4 x 5.4 x 4.8 cm = volume: 129 mL. Echogenicity within normal limits. Multiple simple appearing cysts measuring up to 2.5 cm. No solid mass or hydronephrosis visualized. Left Kidney: Renal measurements: 9.2 x 4.8 x 5.0 cm = volume: 114 mL mL. Echogenicity within normal limits. Multiple simple appearing cysts measuring up to 2.1 cm. No solid mass or hydronephrosis visualized. Bladder: Appears normal for degree of bladder distention. Other: None. IMPRESSION: Multiple bilateral simple appearing renal cysts. No evidence of solid mass, with particular attention to the superior pole of the right kidney. There is a simple cyst in this location which appears to correspond to finding of  prior CT. Electronically Signed   By: Eddie Candle M.D.   On: 12/27/2020 11:08   NM Pulmonary Perf and Vent  Result Date: 12/27/2020 CLINICAL DATA:  Squamous cell carcinoma with chest pain EXAM: NUCLEAR MEDICINE PERFUSION LUNG SCAN TECHNIQUE: Perfusion images were obtained in multiple projections after intravenous injection of radiopharmaceutical. Views: Anterior, posterior, left lateral, right lateral, RPO, LPO, RAO, LAO. RADIOPHARMACEUTICALS:  4.25 mCi Tc-51m MAA IV COMPARISON:  Chest radiograph December 27, 2020 FINDINGS: Radiotracer uptake is homogeneous and symmetric bilaterally. No perfusion defects evident. IMPRESSION:  No perfusion defects evident. No findings indicative of pulmonary embolus. Electronically Signed   By: Lowella Grip III M.D.   On: 12/27/2020 13:55   ECHOCARDIOGRAM COMPLETE  Result Date: 12/27/2020    ECHOCARDIOGRAM REPORT   Patient Name:   [name] Date of Exam: 12/27/2020 Medical Rec #:  [id number]        Height:       64.0 in Accession #:    [id number]       Weight:       127.0 lb Date of Birth:  [birth year]       BSA:          1.613 m Patient Age:    75 years         BP:           135/66 mmHg Patient Gender: F                HR:           69 bpm. Exam Location:  Inpatient Procedure: 2D Echo, Cardiac Doppler and Color Doppler Indications:    Elevated Troponin.  History:        Patient has no prior history of Echocardiogram examinations.                 Stroke, COPD and PAD, Signs/Symptoms:Dyspnea; Risk                 Factors:Hypertension and Dyslipidemia. CKD. Cancer. GERD.  Sonographer:    Jonelle Sidle Dance Referring Phys: 37 [place]  1. Left ventricular ejection fraction, by estimation, is 55 to 60%. The left ventricle has normal function. The left ventricle has no regional wall motion abnormalities. Left ventricular diastolic parameters are indeterminate.  2. Right ventricular systolic function is normal. The right ventricular size is normal.  3. The mitral valve is grossly normal. No evidence of mitral valve regurgitation.  4. The aortic valve is tricuspid. Aortic valve regurgitation is not visualized. No aortic stenosis is present. Comparison(s): No prior Echocardiogram. Conclusion(s)/Recommendation(s): Normal biventricular function without evidence of hemodynamically significant valvular heart disease. FINDINGS  Left Ventricle: Left ventricular ejection fraction, by estimation, is 55 to 60%. The left ventricle has normal function. The left ventricle has no regional wall motion abnormalities. The left ventricular internal cavity size was small. There is no left ventricular  hypertrophy. Left ventricular diastolic parameters are indeterminate. Right Ventricle: The right ventricular size is normal. No increase in right ventricular wall thickness. Right ventricular systolic function is normal. Left Atrium: Left atrial size was normal in size. Right Atrium: Right atrial size was normal in size. Pericardium: There is no evidence of pericardial effusion. Mitral Valve: The mitral valve is grossly normal. Mild mitral annular calcification. No evidence of mitral valve regurgitation. Tricuspid Valve: The tricuspid valve is grossly normal. Tricuspid valve regurgitation is not demonstrated. Aortic Valve: The aortic valve is tricuspid. There is mild aortic valve annular calcification.  Aortic valve regurgitation is not visualized. No aortic stenosis is present. Pulmonic Valve: The pulmonic valve was grossly normal. Pulmonic valve regurgitation is trivial. Aorta: The aortic root and ascending aorta are structurally normal, with no evidence of dilitation. IAS/Shunts: The atrial septum is grossly normal.  LEFT VENTRICLE PLAX 2D LVIDd:         3.70 cm  Diastology LVIDs:         3.50 cm  LV e' medial:    4.13 cm/s LV PW:         1.30 cm  LV E/e' medial:  14.3 LV IVS:        0.90 cm  LV e' lateral:   3.70 cm/s LVOT diam:     1.70 cm  LV E/e' lateral: 15.9 LV SV:         41 LV SV Index:   25 LVOT Area:     2.27 cm  RIGHT VENTRICLE             IVC RV Basal diam:  2.20 cm     IVC diam: 1.50 cm RV S prime:     12.10 cm/s TAPSE (M-mode): 1.4 cm LEFT ATRIUM             Index       RIGHT ATRIUM          Index LA diam:        3.70 cm 2.29 cm/m  RA Area:     9.07 cm LA Vol (A2C):   66.6 ml 41.29 ml/m RA Volume:   18.00 ml 11.16 ml/m LA Vol (A4C):   32.5 ml 20.15 ml/m LA Biplane Vol: 47.9 ml 29.70 ml/m  AORTIC VALVE LVOT Vmax:   88.60 cm/s LVOT Vmean:  51.100 cm/s LVOT VTI:    0.181 m  AORTA Ao Root diam: 3.00 cm Ao Asc diam:  3.00 cm MITRAL VALVE MV Area (PHT): 2.17 cm    SHUNTS MV Decel Time: 349 msec     Systemic VTI:  0.18 m MV E velocity: 58.90 cm/s  Systemic Diam: 1.70 cm MV A velocity: 90.00 cm/s MV E/A ratio:  0.65 Rudean Haskell MD Electronically signed by Rudean Haskell MD Signature Date/Time: 12/27/2020/4:40:12 PM    Final    CT Renal Stone Study  Result Date: 12/27/2020 CLINICAL DATA:  Flank pain, stone disease suspected EXAM: CT ABDOMEN AND PELVIS WITHOUT CONTRAST TECHNIQUE: Multidetector CT imaging of the abdomen and pelvis was performed following the standard protocol without IV contrast. COMPARISON:  CT 05/03/2003 FINDINGS: Lower chest: Lung bases are clear. Normal heart size. No pericardial effusion. Few calcifications on the mitral annulus. Coronary artery calcifications and distal thoracic aortic calcifications are present as well. Hypoattenuation of the cardiac blood pool, suggestive of anemia. Hepatobiliary: No visible focal liver lesion within limitations of this unenhanced CT. Normal liver attenuation. Smooth liver surface contour. Normal gallbladder and biliary tree without visible calcified gallstone. Pancreas: No pancreatic ductal dilatation or surrounding inflammatory changes. Spleen: Normal in size. No concerning splenic lesions. Punctate calcification in the spleen may be vascular or related to prior granulomatous disease (3/22) normal splenic size. No worrisome splenic lesions. Adrenals/Urinary Tract: Normal adrenal glands. Since 2004, there has been development of numerous cysts throughout the kidneys, while many are fluid attenuation, there are several more hyperdense cysts including a 10 mm partially exophytic cyst in the interpolar right kidney (3/35 and a cyst with some layering hyperattenuation in the upper pole left kidney measuring 1.8 cm (3/27). Additionally,  there is a more conspicuous almost solid-appearing lesion arising from the upper pole right kidney which measures approximately 2.5 x 2.2 x 2.8 cm, incompletely characterized on this unenhanced exam. Few  punctate nonobstructing calculi are present in both kidneys. No obstructive urolithiasis is seen though there is slight asymmetric prominence of the left renal pelvis and proximal ureter without visible obstructing calculus or lesion. Multiple vascular calcifications are noted as well. Stomach/Bowel: Sliding-type hiatal hernia. Distal stomach and duodenum are unremarkable. No small bowel thickening or dilatation. No colonic dilatation or wall thickening. High attenuation enteric contrast media traverses part way through the transverse colon. No evidence of bowel obstruction. The appendix is not visualized. Correlate for history of appendectomy. No focal inflammation the vicinity of the cecum to suggest an occult appendicitis. Vascular/Lymphatic: Atherosclerotic calcifications within the abdominal aorta and branch vessels. Focal fusiform infrarenal abdominal aortic dilatation to 2.6 cm in diameter (6/47) arising approximately 1 cm below the renal artery ostia. No other aneurysm or ectasia. 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 No other major venous abnormalities. No enlarged abdominopelvic lymph nodes. Reproductive: Normal appearance of the uterus and adnexal structures. Other: No abdominopelvic free fluid or free gas. No bowel containing hernias. Musculoskeletal: Multilevel degenerative changes are present in the imaged portions of the spine. No acute osseous abnormality or suspicious osseous lesion. IMPRESSION: 1. Slight asymmetric prominence in stranding of the left renal pelvis and proximal ureter without visible obstructing calculus or lesion. Findings could reflect recently passed stone or infection. Correlate with patient's symptoms and urinalysis. No obstructive urolithiasis or frank hydronephrosis at this time. 2. Interval development of numerous cysts throughout the kidneys, while many are fluid attenuation, there are several more hyperdense which could reflect  hemorrhagic or proteinaceous cyst. A more conspicuous solid-appearing lesion arising from the upper pole right kidney which measures approximately 2.5 x 2.2 x 2.8 cm, incompletely characterized on this unenhanced exam. Recommend further evaluation with nonemergent renal ultrasound. 3. Sliding-type hiatal hernia. 4. Hypoattenuation of the cardiac blood pool, suggestive of anemia. 5. Focal infrarenal abdominal aortic dilatation to 2.6 cm. Recommend follow-up ultrasound every 5 years. This recommendation follows ACR consensus guidelines: White Paper of the ACR Incidental Findings Committee II on Vascular Findings. J Am Coll Radiol 2013; 10:789-794. 6. Aortic Atherosclerosis (ICD10-I70.0). These results were called by telephone at the time of interpretation on 12/27/2020 at 12:53 am to provider [facility] , who verbally acknowledged these results. Electronically Signed   By: Lovena Le M.D.   On: 12/27/2020 00:53    Cardiac Studies:  ECG: NSR rate 66 normal    Telemetry:  NSR 12/28/2020   Echo: EF 60-65% no RWMA;s   Medications:   . cilostazol  100 mg Oral BID  . escitalopram  20 mg Oral Daily  . famotidine  20 mg Oral Daily  . fluticasone  2 spray Each Nare Daily  . folic acid  1 mg Oral Daily  . levothyroxine  50 mcg Oral QAC breakfast  . LORazepam  1 mg Oral Daily  . metoprolol tartrate  25 mg Oral BID  . rosuvastatin  20 mg Oral Daily  . sodium chloride flush  3 mL Intravenous Q12H  . thiamine  100 mg Oral Daily     . sodium chloride    . heparin 900 Units/hr (12/28/20 0559)    Assessment/Plan:   1. Elevated troponin:  No evidence of acute ischemic event. ECG normal, echo with no RWMAls Dr Acie Fredrickson indicated lexiscan  myovue Sunday :  Delayed due to V/Q scan done 12/27/20   2. HTN:  Improved continue lopressor   3. CRF:  Stable Cr 1.98   4. HLD:  Continue statin   5. Thyroid:  On replacement TSH 6.8 consider increasing dose by 25 ug/day per primary service   Jenkins Rouge 12/28/2020, 8:48 AM

## 2020-12-28 NOTE — Progress Notes (Signed)
Pt in afib rvr upon this RNs arrival. Pt complaining of chest pain and SOB as well as bilateral leg pain. Cardiology paged and upon arrival to unit a verbal for metoprolol 5 mg was given at bedside. TRH paged about pt leg pain. Awaiting new orders.

## 2020-12-28 NOTE — Progress Notes (Signed)
Patient called out for nursing staff because she was having chest pain. When I arrived to her room her heart rate was in the 120s and she was in a-fib. She felt SOB, BP was 146/86, RR 28-34, SpO2 was 99 on RA. Patient was feeling very anxious as well. EKG done, MD paged, new orders given. Metroprolol 2.5mg  IV given with no change to HR, SL nitro x 1 given with some relief in chest pain but no change in vitals. Cards paged, rapid paged, oxygen applied, new orders given. MD in the room.

## 2020-12-28 NOTE — Plan of Care (Signed)

## 2020-12-28 NOTE — Progress Notes (Signed)
PROGRESS NOTE    [name]  PIR:[id number] DOB: Jun 13, 1946 DOA: 12/26/2020 PCP: Ann Lima, MD   Brief Narrative: 75 year old with past medical history significant for laryngeal cancer status post laryngectomy 2013 and status post chemo and radiation, hypertension, GERD, COPD, anxiety disorder and recent diagnosis of DVT earlier this month now on Eliquis who had an episode of intense diaphoresis, headache, neck pain and abdominal pain.  EMS was contacted who stated patient systolic blood pressures range was  between 170-240 in route.  Patient seemed to improve after Ativan given by EMS.  - On arrival troponin was found to be 684, 681.  Patient was chest pain-free.  Cardiology has been consulted and plan is for stress test. 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  VQ scan negative for PE>  For stress test tomorrow.  Could be related to CKD and Hypertensive urgency.   2-CKD stage IV: Avoid nephrotoxin medication.  Hold ACE>  Prior cr range: 1.7--1.9 Monitor.   3-recent DVT: On heparin gtt. Transition to eliquis if not cath is planned.   Anxiety: Continue with ativan.   Renal mass: Multiple bilateral simple renal cyst,  Follow up out patient.   Hypertensive urgency ; Hypertension: BP check by EMS was 240.  BP was down when she was in the ED.  Continue with metoprolol.  Resume as needed; Amlodipine,  Hold HCTZ and olmesartan.   Hyperlipidemia; Continue with crestor.   Hypothyroidism Continue with synthroid,.   TSH 6.8  AAA, infrarenal; Needs FU US> 5 years  COPD, history of supraglottic cancer with trach: Will consult trach team.   PAD;  Continue with pletal.     Estimated body mass index is 21.87 kg/m as calculated from the following:   Height as of this encounter: 5\' 4"  (1.626 m).   Weight as of this encounter: 57.8 kg.   DVT prophylaxis: Heparin Code Status: Full code Family Communication: Disposition Plan:  Status is: Inpatient  Remains inpatient appropriate because:Ongoing diagnostic testing needed not appropriate for outpatient work up   Dispo: The patient is from: Home              Anticipated d/c is to: Home              Anticipated d/c date is: 2 days              Patient currently is not medically stable to d/c.        Consultants:   Cardiology   Procedures:   VQ scan  Antimicrobials:    Subjective: She is feeling better, denies chest pain or abdominal pain.   Objective: Vitals:   12/27/20 1544 12/27/20 2057 12/27/20 2331 12/28/20 0301  BP: (!) 173/69 (!) 175/58 (!) 141/57 131/62  Pulse: 69 72 65 73  Resp: 16 20 15 20   Temp: 98.8 F (37.1 C) 99 F (37.2 C) 98.5 F (36.9 C) 98.6 F (37 C)  TempSrc: Oral Oral Oral Oral  SpO2: 97% 97% 94% 96%  Weight: 57.8 kg     Height:  5\' 4"  (1.626 m)       Intake/Output Summary (Last 24 hours) at 12/28/2020 0718 Last data filed at 12/28/2020 0559 Gross per 24 hour  Intake 159.23 ml  Output -  Net 159.23 ml   Filed Weights   12/27/20 0915 12/27/20 1544  Weight: 57.6 kg 57.8 kg    Examination:  General exam: Appears calm and comfortable  Respiratory system: Clear to auscultation. Respiratory effort normal. Trach in place Cardiovascular system: S1 & S2 heard, RRR. No JVD, murmurs, rubs, gallops or clicks. No pedal edema. Gastrointestinal system: Abdomen is nondistended, soft and nontender. No organomegaly or masses felt. Normal bowel sounds heard. Central nervous system: Alert and oriented.   Extremities: Symmetric 5 x 5 power. Skin: No rashes, lesions or ulcers   Data Reviewed: I have personally reviewed following labs and imaging studies  CBC: Recent Labs  Lab 12/26/20 2030 12/28/20 0028  WBC 9.2 6.6  NEUTROABS 7.1  --   HGB 9.3* 9.3*  HCT 29.4* 28.5*  MCV 89.6 87.4  PLT 227 622   Basic Metabolic Panel: Recent Labs  Lab 12/26/20 2030 12/28/20 0028  NA 139 140  K 4.9 4.4  CL 109 108  CO2 18* 22  GLUCOSE 124* 92  BUN 50* 45*  CREATININE 2.10* 1.98*  CALCIUM 8.9 9.0   GFR: Estimated Creatinine Clearance: 21.2 mL/min (A) (by C-G formula based on SCr of 1.98 mg/dL (H)). Liver Function Tests: Recent Labs  Lab 12/26/20 2030  AST 21  ALT 19  ALKPHOS 53  BILITOT 0.3  PROT 6.2*  ALBUMIN 3.5   No results for input(s): LIPASE, AMYLASE in the last 168 hours. No results for input(s): AMMONIA in the last 168 hours. Coagulation Profile: Recent Labs  Lab 12/28/20 0028  INR 1.2   Cardiac Enzymes: No results for input(s): CKTOTAL, CKMB, CKMBINDEX, TROPONINI in the last 168 hours. BNP (last 3 results) No results for input(s): PROBNP in the last 8760 hours. HbA1C: Recent Labs    12/27/20 0911  HGBA1C 5.2   CBG: No results for input(s): GLUCAP in the last 168 hours. Lipid Profile: Recent Labs    12/27/20 0912  CHOL 177  HDL 35*  LDLCALC 73  TRIG 346*  CHOLHDL 5.1   Thyroid Function Tests: Recent Labs    12/27/20 0911  TSH 6.833*   Anemia Panel: No results for input(s): VITAMINB12, FOLATE, FERRITIN, TIBC, IRON, RETICCTPCT in the last 72 hours. Sepsis Labs: No results for input(s): PROCALCITON, LATICACIDVEN in the last 168 hours.  Recent Results (from the past 240 hour(s))  SARS CORONAVIRUS 2 (TAT 6-24 HRS) Nasopharyngeal Nasopharyngeal Swab     Status: None   Collection Time: 12/27/20  4:28 AM   Specimen: Nasopharyngeal Swab  Result Value Ref Range Status   SARS Coronavirus 2 NEGATIVE NEGATIVE Final    Comment: (NOTE) SARS-CoV-2  target nucleic acids are NOT DETECTED.  The SARS-CoV-2 RNA is generally detectable in upper and lower respiratory specimens during the acute phase of infection. Negative results do not preclude SARS-CoV-2 infection, do not rule out co-infections with other pathogens, and should not be used as the sole basis for treatment or other patient management decisions. Negative results must be combined with clinical observations, patient history, and epidemiological information. The expected result is Negative.  Fact Sheet for Patients: SugarRoll.be  Fact Sheet for Healthcare Providers: https://www.woods-mathews.com/  This test is not yet approved or cleared by the [place] FDA and  has been authorized for detection and/or diagnosis  of SARS-CoV-2 by FDA under an Emergency Use Authorization (EUA). This EUA will remain  in effect (meaning this test can be used) for the duration of the COVID-19 declaration under Se ction 564(b)(1) of the Act, 21 U.S.C. section 360bbb-3(b)(1), unless the authorization is terminated or revoked sooner.  Performed at [facility], [place] [address] [place], [place] [address]   MRSA PCR Screening     Status: None   Collection Time: 12/27/20  3:47 PM   Specimen: Nasal Mucosa; Nasopharyngeal  Result Value Ref Range Status   MRSA by PCR NEGATIVE NEGATIVE Final    Comment:        The GeneXpert MRSA Assay (FDA approved for NASAL specimens only), is one component of a comprehensive MRSA colonization surveillance program. It is not intended to diagnose MRSA infection nor to guide or monitor treatment for MRSA infections. Performed at [facility], [place] [address], [place], [place] [address]          Radiology Studies: DG Chest 2 View  Result Date: 12/27/2020 CLINICAL DATA:  Cough, fever EXAM: CHEST - 2 VIEW COMPARISON:  10/30/2015 FINDINGS: The heart size and mediastinal contours are within normal  limits. Atherosclerotic calcification of the aortic knob. No focal airspace consolidation, pleural effusion, or pneumothorax. The visualized skeletal structures are unremarkable. IMPRESSION: No active cardiopulmonary disease. Electronically Signed   By: Davina Poke D.O.   On: 12/27/2020 11:51   US RENAL  Result Date: 12/27/2020 CLINICAL DATA:  Renal mass EXAM: RENAL / URINARY TRACT ULTRASOUND COMPLETE COMPARISON:  Same day CT abdomen pelvis, 12:34 a.m. FINDINGS: Right Kidney: Renal measurements: 9.4 x 5.4 x 4.8 cm = volume: 129 mL. Echogenicity within normal limits. Multiple simple appearing cysts measuring up to 2.5 cm. No solid mass or hydronephrosis visualized. Left Kidney: Renal measurements: 9.2 x 4.8 x 5.0 cm = volume: 114 mL mL. Echogenicity within normal limits. Multiple simple appearing cysts measuring up to 2.1 cm. No solid mass or hydronephrosis visualized. Bladder: Appears normal for degree of bladder distention. Other: None. IMPRESSION: Multiple bilateral simple appearing renal cysts. No evidence of solid mass, with particular attention to the superior pole of the right kidney. There is a simple cyst in this location which appears to correspond to finding of prior CT. Electronically Signed   By: Eddie Candle M.D.   On: 12/27/2020 11:08   NM Pulmonary Perf and Vent  Result Date: 12/27/2020 CLINICAL DATA:  Squamous cell carcinoma with chest pain EXAM: NUCLEAR MEDICINE PERFUSION LUNG SCAN TECHNIQUE: Perfusion images were obtained in multiple projections after intravenous injection of radiopharmaceutical. Views: Anterior, posterior, left lateral, right lateral, RPO, LPO, RAO, LAO. RADIOPHARMACEUTICALS:  4.25 mCi Tc-56m MAA IV COMPARISON:  Chest radiograph December 27, 2020 FINDINGS: Radiotracer uptake is homogeneous and symmetric bilaterally. No perfusion defects evident. IMPRESSION: No perfusion defects evident. No findings indicative of pulmonary embolus. Electronically Signed   By: Lowella Grip III M.D.   On: 12/27/2020 13:55   ECHOCARDIOGRAM COMPLETE  Result Date: 12/27/2020    ECHOCARDIOGRAM REPORT   Patient Name:   JOSEE SPEECE Date of Exam: 12/27/2020 Medical Rec #:  [id number]        Height:       64.0 in Accession #:    [id number]       Weight:       127.0 lb Date of Birth:  November 13, 1946       BSA:          1.613  m Patient Age:    55 years         BP:           135/66 mmHg Patient Gender: F                HR:           69 bpm. Exam Location:  Inpatient Procedure: 2D Echo, Cardiac Doppler and Color Doppler Indications:    Elevated Troponin.  History:        Patient has no prior history of Echocardiogram examinations.                 Stroke, COPD and PAD, Signs/Symptoms:Dyspnea; Risk                 Factors:Hypertension and Dyslipidemia. CKD. Cancer. GERD.  Sonographer:    Jonelle Sidle Dance Referring Phys: 20 [place]  1. Left ventricular ejection fraction, by estimation, is 55 to 60%. The left ventricle has normal function. The left ventricle has no regional wall motion abnormalities. Left ventricular diastolic parameters are indeterminate.  2. Right ventricular systolic function is normal. The right ventricular size is normal.  3. The mitral valve is grossly normal. No evidence of mitral valve regurgitation.  4. The aortic valve is tricuspid. Aortic valve regurgitation is not visualized. No aortic stenosis is present. Comparison(s): No prior Echocardiogram. Conclusion(s)/Recommendation(s): Normal biventricular function without evidence of hemodynamically significant valvular heart disease. FINDINGS  Left Ventricle: Left ventricular ejection fraction, by estimation, is 55 to 60%. The left ventricle has normal function. The left ventricle has no regional wall motion abnormalities. The left ventricular internal cavity size was small. There is no left ventricular hypertrophy. Left ventricular diastolic parameters are indeterminate. Right Ventricle: The right ventricular size  is normal. No increase in right ventricular wall thickness. Right ventricular systolic function is normal. Left Atrium: Left atrial size was normal in size. Right Atrium: Right atrial size was normal in size. Pericardium: There is no evidence of pericardial effusion. Mitral Valve: The mitral valve is grossly normal. Mild mitral annular calcification. No evidence of mitral valve regurgitation. Tricuspid Valve: The tricuspid valve is grossly normal. Tricuspid valve regurgitation is not demonstrated. Aortic Valve: The aortic valve is tricuspid. There is mild aortic valve annular calcification. Aortic valve regurgitation is not visualized. No aortic stenosis is present. Pulmonic Valve: The pulmonic valve was grossly normal. Pulmonic valve regurgitation is trivial. Aorta: The aortic root and ascending aorta are structurally normal, with no evidence of dilitation. IAS/Shunts: The atrial septum is grossly normal.  LEFT VENTRICLE PLAX 2D LVIDd:         3.70 cm  Diastology LVIDs:         3.50 cm  LV e' medial:    4.13 cm/s LV PW:         1.30 cm  LV E/e' medial:  14.3 LV IVS:        0.90 cm  LV e' lateral:   3.70 cm/s LVOT diam:     1.70 cm  LV E/e' lateral: 15.9 LV SV:         41 LV SV Index:   25 LVOT Area:     2.27 cm  RIGHT VENTRICLE             IVC RV Basal diam:  2.20 cm     IVC diam: 1.50 cm RV S prime:     12.10 cm/s TAPSE (M-mode): 1.4 cm LEFT ATRIUM  Index       RIGHT ATRIUM          Index LA diam:        3.70 cm 2.29 cm/m  RA Area:     9.07 cm LA Vol (A2C):   66.6 ml 41.29 ml/m RA Volume:   18.00 ml 11.16 ml/m LA Vol (A4C):   32.5 ml 20.15 ml/m LA Biplane Vol: 47.9 ml 29.70 ml/m  AORTIC VALVE LVOT Vmax:   88.60 cm/s LVOT Vmean:  51.100 cm/s LVOT VTI:    0.181 m  AORTA Ao Root diam: 3.00 cm Ao Asc diam:  3.00 cm MITRAL VALVE MV Area (PHT): 2.17 cm    SHUNTS MV Decel Time: 349 msec    Systemic VTI:  0.18 m MV E velocity: 58.90 cm/s  Systemic Diam: 1.70 cm MV A velocity: 90.00 cm/s MV E/A ratio:   0.65 Rudean Haskell MD Electronically signed by Rudean Haskell MD Signature Date/Time: 12/27/2020/4:40:12 PM    Final    CT Renal Stone Study  Result Date: 12/27/2020 CLINICAL DATA:  Flank pain, stone disease suspected EXAM: CT ABDOMEN AND PELVIS WITHOUT CONTRAST TECHNIQUE: Multidetector CT imaging of the abdomen and pelvis was performed following the standard protocol without IV contrast. COMPARISON:  CT 05/03/2003 FINDINGS: Lower chest: Lung bases are clear. Normal heart size. No pericardial effusion. Few calcifications on the mitral annulus. Coronary artery calcifications and distal thoracic aortic calcifications are present as well. Hypoattenuation of the cardiac blood pool, suggestive of anemia. Hepatobiliary: No visible focal liver lesion within limitations of this unenhanced CT. Normal liver attenuation. Smooth liver surface contour. Normal gallbladder and biliary tree without visible calcified gallstone. Pancreas: No pancreatic ductal dilatation or surrounding inflammatory changes. Spleen: Normal in size. No concerning splenic lesions. Punctate calcification in the spleen may be vascular or related to prior granulomatous disease (3/22) normal splenic size. No worrisome splenic lesions. Adrenals/Urinary Tract: Normal adrenal glands. Since 2004, there has been development of numerous cysts throughout the kidneys, while many are fluid attenuation, there are several more hyperdense cysts including a 10 mm partially exophytic cyst in the interpolar right kidney (3/35 and a cyst with some layering hyperattenuation in the upper pole left kidney measuring 1.8 cm (3/27). Additionally, there is a more conspicuous almost solid-appearing lesion arising from the upper pole right kidney which measures approximately 2.5 x 2.2 x 2.8 cm, incompletely characterized on this unenhanced exam. Few punctate nonobstructing calculi are present in both kidneys. No obstructive urolithiasis is seen though there is slight  asymmetric prominence of the left renal pelvis and proximal ureter without visible obstructing calculus or lesion. Multiple vascular calcifications are noted as well. Stomach/Bowel: Sliding-type hiatal hernia. Distal stomach and duodenum are unremarkable. No small bowel thickening or dilatation. No colonic dilatation or wall thickening. High attenuation enteric contrast media traverses part way through the transverse colon. No evidence of bowel obstruction. The appendix is not visualized. Correlate for history of appendectomy. No focal inflammation the vicinity of the cecum to suggest an occult appendicitis. Vascular/Lymphatic: Atherosclerotic calcifications within the abdominal aorta and branch vessels. Focal fusiform infrarenal abdominal aortic dilatation to 2.6 cm in diameter (6/47) arising approximately 1 cm below the renal artery ostia. No other aneurysm or ectasia. 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 No other major venous abnormalities. No enlarged abdominopelvic lymph nodes. Reproductive: Normal appearance of the uterus and adnexal structures. Other: No abdominopelvic free fluid or free gas. No bowel containing hernias. Musculoskeletal: Multilevel degenerative changes  are present in the imaged portions of the spine. No acute osseous abnormality or suspicious osseous lesion. IMPRESSION: 1. Slight asymmetric prominence in stranding of the left renal pelvis and proximal ureter without visible obstructing calculus or lesion. Findings could reflect recently passed stone or infection. Correlate with patient's symptoms and urinalysis. No obstructive urolithiasis or frank hydronephrosis at this time. 2. Interval development of numerous cysts throughout the kidneys, while many are fluid attenuation, there are several more hyperdense which could reflect hemorrhagic or proteinaceous cyst. A more conspicuous solid-appearing lesion arising from the upper pole right kidney which  measures approximately 2.5 x 2.2 x 2.8 cm, incompletely characterized on this unenhanced exam. Recommend further evaluation with nonemergent renal ultrasound. 3. Sliding-type hiatal hernia. 4. Hypoattenuation of the cardiac blood pool, suggestive of anemia. 5. Focal infrarenal abdominal aortic dilatation to 2.6 cm. Recommend follow-up ultrasound every 5 years. This recommendation follows ACR consensus guidelines: White Paper of the ACR Incidental Findings Committee II on Vascular Findings. J Am Coll Radiol 2013; 10:789-794. 6. Aortic Atherosclerosis (ICD10-I70.0). These results were called by telephone at the time of interpretation on 12/27/2020 at 12:53 am to provider [facility] , who verbally acknowledged these results. Electronically Signed   By: Lovena Le M.D.   On: 12/27/2020 00:53        Scheduled Meds: . cilostazol  100 mg Oral BID  . escitalopram  20 mg Oral Daily  . famotidine  20 mg Oral Daily  . fluticasone  2 spray Each Nare Daily  . folic acid  1 mg Oral Daily  . levothyroxine  50 mcg Oral QAC breakfast  . LORazepam  1 mg Oral Daily  . metoprolol tartrate  25 mg Oral BID  . rosuvastatin  20 mg Oral Daily  . sodium chloride flush  3 mL Intravenous Q12H  . thiamine  100 mg Oral Daily   Continuous Infusions: . sodium chloride    . heparin 900 Units/hr (12/28/20 0559)     LOS: 1 day    Time spent: 35 minutes.     Elmarie Shiley, MD [facility]   If 7PM-7AM, please contact night-coverage [email or web address]  12/28/2020, 7:18 AM

## 2020-12-28 NOTE — Progress Notes (Signed)
[place] for heparin Indication: chest pain/ACS  Heparin Dosing Weight: 57.6 kg  Labs: Recent Labs    12/26/20 2030 12/27/20 1822 12/28/20 0028  HGB 9.3*  --  9.3*  HCT 29.4*  --  28.5*  PLT 227  --  211  APTT  --  83* 118*  LABPROT  --   --  14.4  INR  --   --  1.2  HEPARINUNFRC  --  1.62* 1.52*  CREATININE 2.10*  --  1.98*    Assessment: 75 y.o. female admitted with chest pain, h/o DVT and Xarelto on hold, for heparin   Goal of Therapy:  Heparin level 0.3-0.7 units/ml aPTT 66-102 seconds Monitor platelets by anticoagulation protocol: Yes   Plan:  Decrease Heparin 900 units/hr  Phillis Knack, PharmD, BCPS  12/28/2020 2:42 AM

## 2020-12-28 NOTE — Progress Notes (Signed)
Humidity offered to pt but pt refused at this time.

## 2020-12-28 NOTE — Progress Notes (Signed)
Received call from nuclear Myovue won't be able to be done tomorrow Would have to be started after noon given prior V/Q scan  Given weather / snow [facility] nuclear tech only available For emergency.  Will evaluate in am and can consider outpatient myovue Since no trend in troponin, normal ECG and normal EF by Echo with EF 60-65%   Jenkins Rouge MD [facility]

## 2020-12-29 ENCOUNTER — Inpatient Hospital Stay (HOSPITAL_COMMUNITY): Payer: Medicare Other

## 2020-12-29 DIAGNOSIS — I480 Paroxysmal atrial fibrillation: Secondary | ICD-10-CM | POA: Diagnosis not present

## 2020-12-29 DIAGNOSIS — R609 Edema, unspecified: Secondary | ICD-10-CM

## 2020-12-29 DIAGNOSIS — R778 Other specified abnormalities of plasma proteins: Secondary | ICD-10-CM | POA: Diagnosis not present

## 2020-12-29 LAB — CBC
HCT: 28.1 % — ABNORMAL LOW (ref 36.0–46.0)
Hemoglobin: 9.4 g/dL — ABNORMAL LOW (ref 12.0–15.0)
MCH: 29 pg (ref 26.0–34.0)
MCHC: 33.5 g/dL (ref 30.0–36.0)
MCV: 86.7 fL (ref 80.0–100.0)
Platelets: 192 10*3/uL (ref 150–400)
RBC: 3.24 MIL/uL — ABNORMAL LOW (ref 3.87–5.11)
RDW: 13.1 % (ref 11.5–15.5)
WBC: 7.9 10*3/uL (ref 4.0–10.5)
nRBC: 0 % (ref 0.0–0.2)

## 2020-12-29 LAB — BASIC METABOLIC PANEL
Anion gap: 9 (ref 5–15)
BUN: 48 mg/dL — ABNORMAL HIGH (ref 8–23)
CO2: 21 mmol/L — ABNORMAL LOW (ref 22–32)
Calcium: 9.1 mg/dL (ref 8.9–10.3)
Chloride: 109 mmol/L (ref 98–111)
Creatinine, Ser: 2.34 mg/dL — ABNORMAL HIGH (ref 0.44–1.00)
GFR, Estimated: 21 mL/min — ABNORMAL LOW (ref >60)
Glucose, Bld: 115 mg/dL — ABNORMAL HIGH (ref 70–99)
Potassium: 4.8 mmol/L (ref 3.5–5.1)
Sodium: 139 mmol/L (ref 135–145)

## 2020-12-29 LAB — HEPARIN LEVEL (UNFRACTIONATED): Heparin Unfractionated: 1.04 IU/mL — ABNORMAL HIGH (ref 0.30–0.70)

## 2020-12-29 LAB — APTT: aPTT: 92 seconds — ABNORMAL HIGH (ref 24–36)

## 2020-12-29 MED ORDER — SODIUM CHLORIDE 0.9 % IV SOLN: INTRAVENOUS | Status: AC

## 2020-12-29 MED ORDER — CAPSAICIN 0.075 % EX CREA: TOPICAL_CREAM | Freq: Two times a day (BID) | CUTANEOUS | Status: DC

## 2020-12-29 MED ORDER — CAPSAICIN 0.025 % EX CREA
TOPICAL_CREAM | Freq: Two times a day (BID) | CUTANEOUS | Status: DC
  Administered 2020-12-29: 1 via TOPICAL
  Filled 2020-12-29: qty 60

## 2020-12-29 NOTE — Plan of Care (Signed)

## 2020-12-29 NOTE — Progress Notes (Signed)
PROGRESS NOTE    [name]  JKD:[id number] DOB: 02-02-1946 DOA: 12/26/2020 PCP: Janith Lima, MD   Brief Narrative: 75 year old with past medical history significant for laryngeal cancer status post laryngectomy 2013 and status post chemo and radiation, hypertension, GERD, COPD, anxiety disorder and recent diagnosis of DVT earlier this month now on Eliquis who had an episode of intense diaphoresis, headache, neck pain and abdominal pain.  EMS was contacted who stated patient systolic blood pressures range was  between 170-240 in route.  Patient seemed to improve after Ativan given by EMS.  - On arrival troponin was found to be 684, 681.  Patient was chest pain-free.  Cardiology has been consulted and plan is for stress test. -    Assessment & Plan:   Principal Problem:   ACS (acute coronary syndrome) ([place]) Active Problems:   Hypertension   GAD (generalized anxiety disorder)   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   COPD (chronic obstructive pulmonary disease) (H[facility])   Hyperlipidemia LDL goal <130   CRI (chronic renal insufficiency), stage 4 (severe) ([facility])   PAD (peripheral artery disease) ([place])   Other specified hypothyroidism  1-Elevation of troponin, Chest pain, abdominal pain, diaphoresis.  VQ scan negative for PE>  For stress test tomorrow.  Could be related to CKD and Hypertensive urgency. Or could be related to A fib.   2-CKD stage IV: Avoid nephrotoxin medication.  Hold ACE>  Prior cr range: 1.7--1.9 Mild increase cr, poor oral intake, will start IV fluids.   3-PAF; episode of RVR;  Had episode of chest pain yesterday while she was in A fib RVR.  Received IV metoprolol.  Continue with heparin, metoprolol.  She is chest pain free.  recent DVT: left On heparin gtt. Transition to eliquis if not cath is planned.  Complaint of pain right leg; doppler negative.   Anxiety: Continue with ativan.   Renal mass: Multiple bilateral simple renal  cyst,  Follow up out patient.   Hypertensive urgency ; Hypertension: BP check by EMS was 240.  BP was down when she was in the ED.  Continue with metoprolol.  Resume Amlodipine,  Hold HCTZ and olmesartan.   Hyperlipidemia; Continue with crestor.   Hypothyroidism Continue with synthroid,.  TSH 6.8 Her synthroid dose was recently increase to 50 mcg. Continue with current dose.   AAA, infrarenal; Needs FU US> 5 years  COPD, history of supraglottic cancer with trach: Will consult trach team.   PAD;  Continue with pletal.     Estimated body mass index is 21.87 kg/m as calculated from the following:   Height as of this encounter: 5\' 4"  (1.626 m).   Weight as of this encounter: 57.8 kg.   DVT prophylaxis: Heparin Code Status: Full code Family Communication: son/daughter in Sports coach.  Disposition Plan:  Status is: Inpatient  Remains inpatient appropriate because:Ongoing diagnostic testing needed not appropriate for outpatient work up   Dispo: The patient is from: Home              Anticipated d/c is to: Home              Anticipated d/c date is: 2 days              Patient currently is not medically stable to d/c.        Consultants:   Cardiology   Procedures:   VQ scan  Antimicrobials:    Subjective: She is feeling better. Denies chest pain.  Complaining  of pain in her legs. Has a history of fracture left leg, get pain sometimes.    Objective: Vitals:   12/29/20 0400 12/29/20 0446 12/29/20 0754 12/29/20 0925  BP:  130/67 (!) 156/71   Pulse:  60 71 62  Resp:  14 16 14   Temp:  98.4 F (36.9 C) 98.1 F (36.7 C)   TempSrc:  Oral Oral   SpO2: 93% 94% 97% 97%  Weight:      Height:        Intake/Output Summary (Last 24 hours) at 12/29/2020 1118 Last data filed at 12/29/2020 0815 Gross per 24 hour  Intake 915.1 ml  Output 250 ml  Net 665.1 ml   Filed Weights   12/27/20 0915 12/27/20 1544  Weight: 57.6 kg 57.8 kg    Examination:  General  exam: NAD Respiratory system: CTA, Trach in place Cardiovascular system: S 1, S 2 RRR Gastrointestinal system: BS present, soft, nt Central nervous system: alert, oriented  Extremities: Symmetric power Skin: no rashes   Data Reviewed: I have personally reviewed following labs and imaging studies  CBC: Recent Labs  Lab 12/26/20 2030 12/28/20 0028 12/29/20 0103  WBC 9.2 6.6 7.9  NEUTROABS 7.1  --   --   HGB 9.3* 9.3* 9.4*  HCT 29.4* 28.5* 28.1*  MCV 89.6 87.4 86.7  PLT 227 211 277   Basic Metabolic Panel: Recent Labs  Lab 12/26/20 2030 12/28/20 0028 12/29/20 0103  NA 139 140 139  K 4.9 4.4 4.8  CL 109 108 109  CO2 18* 22 21*  GLUCOSE 124* 92 115*  BUN 50* 45* 48*  CREATININE 2.10* 1.98* 2.34*  CALCIUM 8.9 9.0 9.1   GFR: Estimated Creatinine Clearance: 17.9 mL/min (A) (by C-G formula based on SCr of 2.34 mg/dL (H)). Liver Function Tests: Recent Labs  Lab 12/26/20 2030  AST 21  ALT 19  ALKPHOS 53  BILITOT 0.3  PROT 6.2*  ALBUMIN 3.5   No results for input(s): LIPASE, AMYLASE in the last 168 hours. No results for input(s): AMMONIA in the last 168 hours. Coagulation Profile: Recent Labs  Lab 12/28/20 0028  INR 1.2   Cardiac Enzymes: No results for input(s): CKTOTAL, CKMB, CKMBINDEX, TROPONINI in the last 168 hours. BNP (last 3 results) No results for input(s): PROBNP in the last 8760 hours. HbA1C: Recent Labs    12/27/20 0911  HGBA1C 5.2   CBG: No results for input(s): GLUCAP in the last 168 hours. Lipid Profile: Recent Labs    12/27/20 0912  CHOL 177  HDL 35*  LDLCALC 73  TRIG 346*  CHOLHDL 5.1   Thyroid Function Tests: Recent Labs    12/27/20 0911  TSH 6.833*   Anemia Panel: No results for input(s): VITAMINB12, FOLATE, FERRITIN, TIBC, IRON, RETICCTPCT in the last 72 hours. Sepsis Labs: No results for input(s): PROCALCITON, LATICACIDVEN in the last 168 hours.  Recent Results (from the past 240 hour(s))  SARS CORONAVIRUS 2 (TAT  6-24 HRS) Nasopharyngeal Nasopharyngeal Swab     Status: None   Collection Time: 12/27/20  4:28 AM   Specimen: Nasopharyngeal Swab  Result Value Ref Range Status   SARS Coronavirus 2 NEGATIVE NEGATIVE Final    Comment: (NOTE) SARS-CoV-2 target nucleic acids are NOT DETECTED.  The SARS-CoV-2 RNA is generally detectable in upper and lower respiratory specimens during the acute phase of infection. Negative results do not preclude SARS-CoV-2 infection, do not rule out co-infections with other pathogens, and should not be used as the sole  basis for treatment or other patient management decisions. Negative results must be combined with clinical observations, patient history, and epidemiological information. The expected result is Negative.  Fact Sheet for Patients: SugarRoll.be  Fact Sheet for Healthcare Providers: https://www.woods-mathews.com/  This test is not yet approved or cleared by the [place] FDA and  has been authorized for detection and/or diagnosis of SARS-CoV-2 by FDA under an Emergency Use Authorization (EUA). This EUA will remain  in effect (meaning this test can be used) for the duration of the COVID-19 declaration under Se ction 564(b)(1) of the Act, 21 U.S.C. section 360bbb-3(b)(1), unless the authorization is terminated or revoked sooner.  Performed at [facility], [place] [address] [place], [place] [address]   MRSA PCR Screening     Status: None   Collection Time: 12/27/20  3:47 PM   Specimen: Nasal Mucosa; Nasopharyngeal  Result Value Ref Range Status   MRSA by PCR NEGATIVE NEGATIVE Final    Comment:        The GeneXpert MRSA Assay (FDA approved for NASAL specimens only), is one component of a comprehensive MRSA colonization surveillance program. It is not intended to diagnose MRSA infection nor to guide or monitor treatment for MRSA infections. Performed at [facility], [place] [address]  [place], [place] [address]          Radiology Studies: DG Chest 2 View  Result Date: 12/27/2020 CLINICAL DATA:  Cough, fever EXAM: CHEST - 2 VIEW COMPARISON:  10/30/2015 FINDINGS: The heart size and mediastinal contours are within normal limits. Atherosclerotic calcification of the aortic knob. No focal airspace consolidation, pleural effusion, or pneumothorax. The visualized skeletal structures are unremarkable. IMPRESSION: No active cardiopulmonary disease. Electronically Signed   By: Davina Poke D.O.   On: 12/27/2020 11:51   NM Pulmonary Perf and Vent  Result Date: 12/27/2020 CLINICAL DATA:  Squamous cell carcinoma with chest pain EXAM: NUCLEAR MEDICINE PERFUSION LUNG SCAN TECHNIQUE: Perfusion images were obtained in multiple projections after intravenous injection of radiopharmaceutical. Views: Anterior, posterior, left lateral, right lateral, RPO, LPO, RAO, LAO. RADIOPHARMACEUTICALS:  4.25 mCi Tc-16m MAA IV COMPARISON:  Chest radiograph December 27, 2020 FINDINGS: Radiotracer uptake is homogeneous and symmetric bilaterally. No perfusion defects evident. IMPRESSION: No perfusion defects evident. No findings indicative of pulmonary embolus. Electronically Signed   By: Lowella Grip III M.D.   On: 12/27/2020 13:55   ECHOCARDIOGRAM COMPLETE  Result Date: 12/27/2020    ECHOCARDIOGRAM REPORT   Patient Name:   [name] Date of Exam: 12/27/2020 Medical Rec #:  [id number]        Height:       64.0 in Accession #:    [id number]       Weight:       127.0 lb Date of Birth:  19-Dec-1945       BSA:          1.613 m Patient Age:    25 years         BP:           135/66 mmHg Patient Gender: F                HR:           69 bpm. Exam Location:  Inpatient Procedure: 2D Echo, Cardiac Doppler and Color Doppler Indications:    Elevated Troponin.  History:        Patient has no prior history of Echocardiogram examinations.  Stroke, COPD and PAD, Signs/Symptoms:Dyspnea; Risk                  Factors:Hypertension and Dyslipidemia. CKD. Cancer. GERD.  Sonographer:    Jonelle Sidle Dance Referring Phys: 66 Vivian  1. Left ventricular ejection fraction, by estimation, is 55 to 60%. The left ventricle has normal function. The left ventricle has no regional wall motion abnormalities. Left ventricular diastolic parameters are indeterminate.  2. Right ventricular systolic function is normal. The right ventricular size is normal.  3. The mitral valve is grossly normal. No evidence of mitral valve regurgitation.  4. The aortic valve is tricuspid. Aortic valve regurgitation is not visualized. No aortic stenosis is present. Comparison(s): No prior Echocardiogram. Conclusion(s)/Recommendation(s): Normal biventricular function without evidence of hemodynamically significant valvular heart disease. FINDINGS  Left Ventricle: Left ventricular ejection fraction, by estimation, is 55 to 60%. The left ventricle has normal function. The left ventricle has no regional wall motion abnormalities. The left ventricular internal cavity size was small. There is no left ventricular hypertrophy. Left ventricular diastolic parameters are indeterminate. Right Ventricle: The right ventricular size is normal. No increase in right ventricular wall thickness. Right ventricular systolic function is normal. Left Atrium: Left atrial size was normal in size. Right Atrium: Right atrial size was normal in size. Pericardium: There is no evidence of pericardial effusion. Mitral Valve: The mitral valve is grossly normal. Mild mitral annular calcification. No evidence of mitral valve regurgitation. Tricuspid Valve: The tricuspid valve is grossly normal. Tricuspid valve regurgitation is not demonstrated. Aortic Valve: The aortic valve is tricuspid. There is mild aortic valve annular calcification. Aortic valve regurgitation is not visualized. No aortic stenosis is present. Pulmonic Valve: The pulmonic valve was grossly normal. Pulmonic  valve regurgitation is trivial. Aorta: The aortic root and ascending aorta are structurally normal, with no evidence of dilitation. IAS/Shunts: The atrial septum is grossly normal.  LEFT VENTRICLE PLAX 2D LVIDd:         3.70 cm  Diastology LVIDs:         3.50 cm  LV e' medial:    4.13 cm/s LV PW:         1.30 cm  LV E/e' medial:  14.3 LV IVS:        0.90 cm  LV e' lateral:   3.70 cm/s LVOT diam:     1.70 cm  LV E/e' lateral: 15.9 LV SV:         41 LV SV Index:   25 LVOT Area:     2.27 cm  RIGHT VENTRICLE             IVC RV Basal diam:  2.20 cm     IVC diam: 1.50 cm RV S prime:     12.10 cm/s TAPSE (M-mode): 1.4 cm LEFT ATRIUM             Index       RIGHT ATRIUM          Index LA diam:        3.70 cm 2.29 cm/m  RA Area:     9.07 cm LA Vol (A2C):   66.6 ml 41.29 ml/m RA Volume:   18.00 ml 11.16 ml/m LA Vol (A4C):   32.5 ml 20.15 ml/m LA Biplane Vol: 47.9 ml 29.70 ml/m  AORTIC VALVE LVOT Vmax:   88.60 cm/s LVOT Vmean:  51.100 cm/s LVOT VTI:    0.181 m  AORTA Ao Root diam: 3.00 cm Ao  Asc diam:  3.00 cm MITRAL VALVE MV Area (PHT): 2.17 cm    SHUNTS MV Decel Time: 349 msec    Systemic VTI:  0.18 m MV E velocity: 58.90 cm/s  Systemic Diam: 1.70 cm MV A velocity: 90.00 cm/s MV E/A ratio:  0.65 Rudean Haskell MD Electronically signed by Rudean Haskell MD Signature Date/Time: 12/27/2020/4:40:12 PM    Final    VAS [place] LOWER EXTREMITY VENOUS (DVT)  Result Date: 12/29/2020  Lower Venous DVT Study Indications: Edema.  Comparison Study: no prior Performing Technologist: Abram Sander RVS  Examination Guidelines: A complete evaluation includes B-mode imaging, spectral Doppler, color Doppler, and power Doppler as needed of all accessible portions of each vessel. Bilateral testing is considered an integral part of a complete examination. Limited examinations for reoccurring indications may be performed as noted. The reflux portion of the exam is performed with the patient in reverse Trendelenburg.   +-----+---------------+---------+-----------+----------+--------------+ RIGHTCompressibilityPhasicitySpontaneityPropertiesThrombus Aging +-----+---------------+---------+-----------+----------+--------------+ CFV  Full           Yes      Yes                                 +-----+---------------+---------+-----------+----------+--------------+   +---------+---------------+---------+-----------+----------+--------------+ LEFT     CompressibilityPhasicitySpontaneityPropertiesThrombus Aging +---------+---------------+---------+-----------+----------+--------------+ CFV      Full           Yes      Yes                                 +---------+---------------+---------+-----------+----------+--------------+ SFJ      Full                                                        +---------+---------------+---------+-----------+----------+--------------+ FV Prox  Full                                                        +---------+---------------+---------+-----------+----------+--------------+ FV Mid   Full                                                        +---------+---------------+---------+-----------+----------+--------------+ FV DistalFull                                                        +---------+---------------+---------+-----------+----------+--------------+ PFV      Full                                                        +---------+---------------+---------+-----------+----------+--------------+ POP      Full  Yes      Yes                                 +---------+---------------+---------+-----------+----------+--------------+ PTV      Full                                                        +---------+---------------+---------+-----------+----------+--------------+ PERO     Full                                                         +---------+---------------+---------+-----------+----------+--------------+     Summary: RIGHT: - No evidence of common femoral vein obstruction.  LEFT: - There is no evidence of deep vein thrombosis in the lower extremity.  - No cystic structure found in the popliteal fossa.  *See table(s) above for measurements and observations.    Preliminary         Scheduled Meds: . aspirin EC  81 mg Oral Daily  . capsaicin   Topical BID  . cilostazol  100 mg Oral BID  . escitalopram  20 mg Oral Daily  . famotidine  20 mg Oral Daily  . fluticasone  2 spray Each Nare Daily  . folic acid  1 mg Oral Daily  . levothyroxine  50 mcg Oral QAC breakfast  . LORazepam  1 mg Oral Daily  . metoprolol tartrate  50 mg Oral BID  . rosuvastatin  20 mg Oral Daily  . sodium chloride flush  3 mL Intravenous Q12H  . thiamine  100 mg Oral Daily   Continuous Infusions: . sodium chloride    . sodium chloride 75 mL/hr at 12/29/20 1107  . heparin 900 Units/hr (12/29/20 0400)     LOS: 2 days    Time spent: 35 minutes.     Elmarie Shiley, MD [facility]   If 7PM-7AM, please contact night-coverage [email or web address]  12/29/2020, 11:18 AM

## 2020-12-29 NOTE — Progress Notes (Signed)
Subjective:  Denies SSCP, palpitations or Dyspnea Myovue cancelled due to weather and tech not coming in  Except for emergency Had some PAF last night converted  Eliquis has been on hold being covered with heparin   Objective:  Vitals:   12/28/20 2354 12/29/20 0400 12/29/20 0446 12/29/20 0754  BP:   130/67 (!) 156/71  Pulse: 65  60 71  Resp: 15  14 16   Temp:   98.4 F (36.9 C) 98.1 F (36.7 C)  TempSrc:   Oral Oral  SpO2: 94% 93% 94% 97%  Weight:      Height:        Intake/Output from previous day:  Intake/Output Summary (Last 24 hours) at 12/29/2020 0847 Last data filed at 12/29/2020 0400 Gross per 24 hour  Intake 675.1 ml  Output 250 ml  Net 425.1 ml    Physical Exam: Chronically ill Tracheostomy in place Lungs clear No murmur  No edema   Lab Results: Basic Metabolic Panel: Recent Labs    12/28/20 0028 12/29/20 0103  NA 140 139  K 4.4 4.8  CL 108 109  CO2 22 21*  GLUCOSE 92 115*  BUN 45* 48*  CREATININE 1.98* 2.34*  CALCIUM 9.0 9.1   Liver Function Tests: Recent Labs    12/26/20 2030  AST 21  ALT 19  ALKPHOS 53  BILITOT 0.3  PROT 6.2*  ALBUMIN 3.5   No results for input(s): LIPASE, AMYLASE in the last 72 hours. CBC: Recent Labs    12/26/20 2030 12/28/20 0028 12/29/20 0103  WBC 9.2 6.6 7.9  NEUTROABS 7.1  --   --   HGB 9.3* 9.3* 9.4*  HCT 29.4* 28.5* 28.1*  MCV 89.6 87.4 86.7  PLT 227 211 192   Cardiac Enzymes: No results for input(s): CKTOTAL, CKMB, CKMBINDEX, TROPONINI in the last 72 hours. BNP: Invalid input(s): POCBNP D-Dimer: No results for input(s): DDIMER in the last 72 hours. Hemoglobin A1C: Recent Labs    12/27/20 0911  HGBA1C 5.2   Fasting Lipid Panel: Recent Labs    12/27/20 0912  CHOL 177  HDL 35*  LDLCALC 73  TRIG 346*  CHOLHDL 5.1   Thyroid Function Tests: Recent Labs    12/27/20 0911  TSH 6.833*   Anemia Panel: No results for input(s): VITAMINB12, FOLATE, FERRITIN, TIBC, IRON, RETICCTPCT in  the last 72 hours.  Imaging: DG Chest 2 View  Result Date: 12/27/2020 CLINICAL DATA:  Cough, fever EXAM: CHEST - 2 VIEW COMPARISON:  10/30/2015 FINDINGS: The heart size and mediastinal contours are within normal limits. Atherosclerotic calcification of the aortic knob. No focal airspace consolidation, pleural effusion, or pneumothorax. The visualized skeletal structures are unremarkable. IMPRESSION: No active cardiopulmonary disease. Electronically Signed   By: Davina Poke D.O.   On: 12/27/2020 11:51   US RENAL  Result Date: 12/27/2020 CLINICAL DATA:  Renal mass EXAM: RENAL / URINARY TRACT ULTRASOUND COMPLETE COMPARISON:  Same day CT abdomen pelvis, 12:34 a.m. FINDINGS: Right Kidney: Renal measurements: 9.4 x 5.4 x 4.8 cm = volume: 129 mL. Echogenicity within normal limits. Multiple simple appearing cysts measuring up to 2.5 cm. No solid mass or hydronephrosis visualized. Left Kidney: Renal measurements: 9.2 x 4.8 x 5.0 cm = volume: 114 mL mL. Echogenicity within normal limits. Multiple simple appearing cysts measuring up to 2.1 cm. No solid mass or hydronephrosis visualized. Bladder: Appears normal for degree of bladder distention. Other: None. IMPRESSION: Multiple bilateral simple appearing renal cysts. No evidence of solid mass, with  particular attention to the superior pole of the right kidney. There is a simple cyst in this location which appears to correspond to finding of prior CT. Electronically Signed   By: Eddie Candle M.D.   On: 12/27/2020 11:08   NM Pulmonary Perf and Vent  Result Date: 12/27/2020 CLINICAL DATA:  Squamous cell carcinoma with chest pain EXAM: NUCLEAR MEDICINE PERFUSION LUNG SCAN TECHNIQUE: Perfusion images were obtained in multiple projections after intravenous injection of radiopharmaceutical. Views: Anterior, posterior, left lateral, right lateral, RPO, LPO, RAO, LAO. RADIOPHARMACEUTICALS:  4.25 mCi Tc-15m MAA IV COMPARISON:  Chest radiograph December 27, 2020 FINDINGS:  Radiotracer uptake is homogeneous and symmetric bilaterally. No perfusion defects evident. IMPRESSION: No perfusion defects evident. No findings indicative of pulmonary embolus. Electronically Signed   By: Lowella Grip III M.D.   On: 12/27/2020 13:55   ECHOCARDIOGRAM COMPLETE  Result Date: 12/27/2020    ECHOCARDIOGRAM REPORT   Patient Name:   QUINNE PIRES Date of Exam: 12/27/2020 Medical Rec #:  [id number]        Height:       64.0 in Accession #:    [id number]       Weight:       127.0 lb Date of Birth:  11-23-1946       BSA:          1.613 m Patient Age:    75 years         BP:           135/66 mmHg Patient Gender: F                HR:           69 bpm. Exam Location:  Inpatient Procedure: 2D Echo, Cardiac Doppler and Color Doppler Indications:    Elevated Troponin.  History:        Patient has no prior history of Echocardiogram examinations.                 Stroke, COPD and PAD, Signs/Symptoms:Dyspnea; Risk                 Factors:Hypertension and Dyslipidemia. CKD. Cancer. GERD.  Sonographer:    Jonelle Sidle Dance Referring Phys: 68 [place]  1. Left ventricular ejection fraction, by estimation, is 55 to 60%. The left ventricle has normal function. The left ventricle has no regional wall motion abnormalities. Left ventricular diastolic parameters are indeterminate.  2. Right ventricular systolic function is normal. The right ventricular size is normal.  3. The mitral valve is grossly normal. No evidence of mitral valve regurgitation.  4. The aortic valve is tricuspid. Aortic valve regurgitation is not visualized. No aortic stenosis is present. Comparison(s): No prior Echocardiogram. Conclusion(s)/Recommendation(s): Normal biventricular function without evidence of hemodynamically significant valvular heart disease. FINDINGS  Left Ventricle: Left ventricular ejection fraction, by estimation, is 55 to 60%. The left ventricle has normal function. The left ventricle has no regional wall  motion abnormalities. The left ventricular internal cavity size was small. There is no left ventricular hypertrophy. Left ventricular diastolic parameters are indeterminate. Right Ventricle: The right ventricular size is normal. No increase in right ventricular wall thickness. Right ventricular systolic function is normal. Left Atrium: Left atrial size was normal in size. Right Atrium: Right atrial size was normal in size. Pericardium: There is no evidence of pericardial effusion. Mitral Valve: The mitral valve is grossly normal. Mild mitral annular calcification. No evidence of mitral valve regurgitation. Tricuspid Valve: The  tricuspid valve is grossly normal. Tricuspid valve regurgitation is not demonstrated. Aortic Valve: The aortic valve is tricuspid. There is mild aortic valve annular calcification. Aortic valve regurgitation is not visualized. No aortic stenosis is present. Pulmonic Valve: The pulmonic valve was grossly normal. Pulmonic valve regurgitation is trivial. Aorta: The aortic root and ascending aorta are structurally normal, with no evidence of dilitation. IAS/Shunts: The atrial septum is grossly normal.  LEFT VENTRICLE PLAX 2D LVIDd:         3.70 cm  Diastology LVIDs:         3.50 cm  LV e' medial:    4.13 cm/s LV PW:         1.30 cm  LV E/e' medial:  14.3 LV IVS:        0.90 cm  LV e' lateral:   3.70 cm/s LVOT diam:     1.70 cm  LV E/e' lateral: 15.9 LV SV:         41 LV SV Index:   25 LVOT Area:     2.27 cm  RIGHT VENTRICLE             IVC RV Basal diam:  2.20 cm     IVC diam: 1.50 cm RV S prime:     12.10 cm/s TAPSE (M-mode): 1.4 cm LEFT ATRIUM             Index       RIGHT ATRIUM          Index LA diam:        3.70 cm 2.29 cm/m  RA Area:     9.07 cm LA Vol (A2C):   66.6 ml 41.29 ml/m RA Volume:   18.00 ml 11.16 ml/m LA Vol (A4C):   32.5 ml 20.15 ml/m LA Biplane Vol: 47.9 ml 29.70 ml/m  AORTIC VALVE LVOT Vmax:   88.60 cm/s LVOT Vmean:  51.100 cm/s LVOT VTI:    0.181 m  AORTA Ao Root diam:  3.00 cm Ao Asc diam:  3.00 cm MITRAL VALVE MV Area (PHT): 2.17 cm    SHUNTS MV Decel Time: 349 msec    Systemic VTI:  0.18 m MV E velocity: 58.90 cm/s  Systemic Diam: 1.70 cm MV A velocity: 90.00 cm/s MV E/A ratio:  0.65 Rudean Haskell MD Electronically signed by Rudean Haskell MD Signature Date/Time: 12/27/2020/4:40:12 PM    Final     Cardiac Studies:  ECG: NSR rate 66 normal    Telemetry:  NSR 12/29/2020   Echo: EF 60-65% no RWMA;s   Medications:   . aspirin EC  81 mg Oral Daily  . cilostazol  100 mg Oral BID  . escitalopram  20 mg Oral Daily  . famotidine  20 mg Oral Daily  . fluticasone  2 spray Each Nare Daily  . folic acid  1 mg Oral Daily  . levothyroxine  50 mcg Oral QAC breakfast  . LORazepam  1 mg Oral Daily  . metoprolol tartrate  50 mg Oral BID  . rosuvastatin  20 mg Oral Daily  . sodium chloride flush  3 mL Intravenous Q12H  . thiamine  100 mg Oral Daily     . sodium chloride    . heparin 900 Units/hr (12/29/20 0400)    Assessment/Plan:   1. Elevated troponin:  No evidence of acute ischemic event. ECG normal, echo with no RWMAls Dr Acie Fredrickson indicated lexiscan myovue  Delayed due to V/Q scan done 12/27/20 and weather To be done  in am   2. HTN:  Improved continue lopressor   3. CRF:  Stable Cr 1.98   4. HLD:  Continue statin   5. Thyroid:  On replacement TSH 6.8 consider increasing dose by 25 ug/day per primary service   6. PAF: converted resume eliquis once stress test back if no need for cath On lopressor   Jenkins Rouge 12/29/2020, 8:47 AM

## 2020-12-29 NOTE — Progress Notes (Signed)
Lower extremity venous has been completed.   Preliminary results in CV Proc.   Abram Sander 12/29/2020 11:17 AM

## 2020-12-29 NOTE — Progress Notes (Signed)
[place] for heparin Indication: chest pain/ACS  Heparin Dosing Weight: 57.6 kg  Labs: Recent Labs    12/26/20 2030 12/27/20 1822 12/28/20 0028 12/29/20 0103  HGB 9.3*  --  9.3* 9.4*  HCT 29.4*  --  28.5* 28.1*  PLT 227  --  211 192  APTT  --  83* 118* 92*  LABPROT  --   --  14.4  --   INR  --   --  1.2  --   HEPARINUNFRC  --  1.62* 1.52* 1.04*  CREATININE 2.10*  --  1.98* 2.34*    Assessment: 75 y.o. female admitted with chest pain, h/o recent DVT on apixaban. Pt started on IV heparin while ruling out ACS. Heparin level not quite correlating with aPTT which is therapeutic. CBC stable. Pt in AFib RVR yesterday afternoon.  Goal of Therapy:  Heparin level 0.3-0.7 units/ml aPTT 66-102 seconds Monitor platelets by anticoagulation protocol: Yes   Plan:  Continue heparin 900 units/h Daily aPTT, heparin level, CBC   Arrie Senate, PharmD, [place], [facility] Clinical Pharmacist [phone] Please check AMION for all [place] numbers 12/29/2020

## 2020-12-30 ENCOUNTER — Inpatient Hospital Stay (HOSPITAL_COMMUNITY): Payer: Medicare Other

## 2020-12-30 DIAGNOSIS — I249 Acute ischemic heart disease, unspecified: Principal | ICD-10-CM

## 2020-12-30 DIAGNOSIS — I214 Non-ST elevation (NSTEMI) myocardial infarction: Secondary | ICD-10-CM

## 2020-12-30 DIAGNOSIS — I739 Peripheral vascular disease, unspecified: Secondary | ICD-10-CM | POA: Diagnosis not present

## 2020-12-30 LAB — CBC
HCT: 28 % — ABNORMAL LOW (ref 36.0–46.0)
Hemoglobin: 8.9 g/dL — ABNORMAL LOW (ref 12.0–15.0)
MCH: 28.2 pg (ref 26.0–34.0)
MCHC: 31.8 g/dL (ref 30.0–36.0)
MCV: 88.6 fL (ref 80.0–100.0)
Platelets: 188 10*3/uL (ref 150–400)
RBC: 3.16 MIL/uL — ABNORMAL LOW (ref 3.87–5.11)
RDW: 13 % (ref 11.5–15.5)
WBC: 7.4 10*3/uL (ref 4.0–10.5)
nRBC: 0 % (ref 0.0–0.2)

## 2020-12-30 LAB — APTT: aPTT: 85 seconds — ABNORMAL HIGH (ref 24–36)

## 2020-12-30 LAB — BASIC METABOLIC PANEL
Anion gap: 11 (ref 5–15)
BUN: 53 mg/dL — ABNORMAL HIGH (ref 8–23)
CO2: 20 mmol/L — ABNORMAL LOW (ref 22–32)
Calcium: 8.8 mg/dL — ABNORMAL LOW (ref 8.9–10.3)
Chloride: 108 mmol/L (ref 98–111)
Creatinine, Ser: 2.23 mg/dL — ABNORMAL HIGH (ref 0.44–1.00)
GFR, Estimated: 22 mL/min — ABNORMAL LOW (ref >60)
Glucose, Bld: 110 mg/dL — ABNORMAL HIGH (ref 70–99)
Potassium: 4.6 mmol/L (ref 3.5–5.1)
Sodium: 139 mmol/L (ref 135–145)

## 2020-12-30 LAB — HEPARIN LEVEL (UNFRACTIONATED): Heparin Unfractionated: 0.88 IU/mL — ABNORMAL HIGH (ref 0.30–0.70)

## 2020-12-30 LAB — TROPONIN I (HIGH SENSITIVITY)
Troponin I (High Sensitivity): 1567 ng/L (ref <18)
Troponin I (High Sensitivity): 1104 ng/L (ref <18)

## 2020-12-30 MED ORDER — TECHNETIUM TC 99M TETROFOSMIN IV KIT
10.7000 | PACK | Freq: Once | INTRAVENOUS | Status: AC | PRN
  Administered 2020-12-30: 10.7 via INTRAVENOUS

## 2020-12-30 MED ORDER — MORPHINE SULFATE (PF) 2 MG/ML IV SOLN
INTRAVENOUS | Status: AC
  Filled 2020-12-30: qty 1

## 2020-12-30 MED ORDER — ISOSORBIDE MONONITRATE ER 30 MG PO TB24
30.0000 mg | ORAL_TABLET | Freq: Every day | ORAL | Status: DC
  Administered 2020-12-30 – 2021-01-02 (×4): 30 mg via ORAL
  Filled 2020-12-30 (×4): qty 1

## 2020-12-30 MED ORDER — HYDRALAZINE HCL 20 MG/ML IJ SOLN
10.0000 mg | INTRAMUSCULAR | Status: DC | PRN
  Administered 2020-12-30 – 2020-12-31 (×2): 10 mg via INTRAVENOUS
  Filled 2020-12-30: qty 1

## 2020-12-30 MED ORDER — REGADENOSON 0.4 MG/5ML IV SOLN
0.4000 mg | Freq: Once | INTRAVENOUS | Status: DC
  Filled 2020-12-30: qty 5

## 2020-12-30 MED ORDER — SODIUM CHLORIDE 0.9 % IV SOLN: INTRAVENOUS | Status: DC

## 2020-12-30 MED ORDER — REGADENOSON 0.4 MG/5ML IV SOLN
INTRAVENOUS | Status: AC
  Filled 2020-12-30: qty 5

## 2020-12-30 MED ORDER — HYDRALAZINE HCL 20 MG/ML IJ SOLN
INTRAMUSCULAR | Status: AC
  Filled 2020-12-30: qty 1

## 2020-12-30 NOTE — Progress Notes (Signed)
   Called to do MV.  SBP consistently > 225 Pt c/o L flank pain and chest pain, 10/10.  Also very anxious. Per Dtr in law, pt has problems w/ high BP in setting of anxiety, improved w/ lorazepam.  Pt w/ chest wall tenderness, clear lungs. Physical exam o/w unremarkable.  Dr Gasper Sells present.  Plan:  Cancel MV Manage symptoms Continue metoprolol for BP Decide tomorrow if can do cath (w/ risk of renal injury accepted), or f/u as outpt and do MV then, with pt taking home rx before coming in.   Rosaria Ferries, PA-C 12/30/2020 12:16 PM

## 2020-12-30 NOTE — Progress Notes (Signed)
Stress test cancelled per Dr. Gasper Sells. BP systolic in the 322-025K. Pain 10/10. Given 10 mg hydralazine and 1mg  morphine IV

## 2020-12-30 NOTE — Progress Notes (Signed)
Patient plugged at this time and don't require any suctioning. RT will continue to monitor.

## 2020-12-30 NOTE — Telephone Encounter (Signed)
Attempted to discuss lumbar spine x-ray results with [name].  She states her mom is hospitalized now & "I don't care anything about this results."  Daughter in law upset that we have not contacted her despite her phone calls.  Explained to her that voice mails were left with her & when they were left.   [name] has no further ques/concerns at this time.

## 2020-12-30 NOTE — Progress Notes (Signed)
ANTICOAGULATION CONSULT NOTE - Initial Consult  Pharmacy Consult for IV heparin Indication: chest pain/ACS  Allergies  Allergen Reactions  . Xyzal [Levocetirizine Dihydrochloride] Itching    Patient Measurements: Height: 5\' 4"  (162.6 cm) Weight: 57.8 kg (127 lb 6.8 oz) IBW/kg (Calculated) : 54.7 Heparin Dosing Weight: 57.8 kg  Vital Signs: Temp: 98.7 F (37.1 C) (01/17 0325) Temp Source: Oral (01/17 0325) BP: 138/52 (01/17 0325) Pulse Rate: 66 (01/17 0325)  Labs: Recent Labs    12/27/20 0912 12/27/20 1822 12/28/20 0028 12/28/20 0454 12/29/20 0103 12/30/20 0021  HGB  --    < > 9.3*  --  9.4* 8.9*  HCT  --   --  28.5*  --  28.1* 28.0*  PLT  --   --  211  --  192 188  APTT  --    < > 118*  --  92* 85*  LABPROT  --   --  14.4  --   --   --   INR  --   --  1.2  --   --   --   HEPARINUNFRC  --    < > 1.52*  --  1.04* 0.88*  CREATININE  --   --  1.98*  --  2.34* 2.23*  TROPONINIHS 829*  --   --  906*  --   --    < > = values in this interval not displayed.    Estimated Creatinine Clearance: 18.8 mL/min (A) (by C-G formula based on SCr of 2.23 mg/dL (H)).   Medications:  Infusions:  . sodium chloride    . sodium chloride    . heparin 900 Units/hr (12/30/20 0325)    Assessment: 75 y.o. female admitted with chest pain, h/o recent DVT on apixaban. Pt started on IV heparin while ruling out ACS. Heparin level not quite correlating with aPTT which is therapeutic. CBC stable. Pt in AFib RVR yesterday afternoon.  APTT in goal range this AM, heparin level still falsely elevated from recent Eliquis.  No overt bleeding or complications noted.  CBC fairly stable.  Goal of Therapy:  Heparin level 0.3-0.7 units/ml aPTT 66-102 seconds Monitor platelets by anticoagulation protocol: Yes   Plan:  Continue IV heparin at 900 units/hr. Daily aPTT, heparin level and CBC.  [place] Crane, Roylene Reason, BCCP Clinical Pharmacist  12/30/2020 7:19 AM   [facility] pharmacy phone numbers  are listed on [email or web address]

## 2020-12-30 NOTE — Plan of Care (Signed)

## 2020-12-30 NOTE — Progress Notes (Signed)
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  Negative V/Q scan  Attempted to eval 9 AM:  At [place].  Called for rapid response re:  SBP 250 and pain.  Patient notes that she is having pain everywhere:  her legs and her knees.  Her daughter in [place] was tele-conferenced in and mentioned that she has bilaterally leg pain this morning.  Patient and DIL were unaware of significant of kidney dysfunction.  Given hydralazine 10 mg PO and found to have improvement in her chest pain.  Relevant interval testing or therapy include delay in NM Stress until today due to inclement weather.    Notes chest pain that has now resolved.  No SOB/DOE and no PND/Orthopnea.   No palpitations or syncope.    Inpatient Medications    Scheduled Meds:  aspirin EC  81 mg Oral Daily   capsaicin   Topical BID   cilostazol  100 mg Oral BID   escitalopram  20 mg Oral Daily   famotidine  20 mg Oral Daily   fluticasone  2 spray Each Nare Daily   folic acid  1 mg Oral Daily   levothyroxine  50 mcg Oral QAC breakfast   LORazepam  1 mg Oral Daily   metoprolol tartrate  50 mg Oral BID   rosuvastatin  20 mg Oral Daily   sodium chloride flush  3 mL Intravenous Q12H   thiamine  100 mg Oral Daily   Continuous Infusions:  sodium chloride     sodium chloride 75 mL/hr at 12/30/20 0758   heparin 900 Units/hr (12/30/20 0325)   PRN Meds: sodium chloride, acetaminophen **OR** acetaminophen, calcium carbonate (dosed in mg elemental calcium), camphor-menthol **AND** hydrOXYzine, diphenoxylate-atropine, docusate sodium, feeding supplement  (NEPRO CARB STEADY), HYDROcodone-acetaminophen, morphine injection, nitroGLYCERIN, ondansetron **OR** ondansetron (ZOFRAN) IV, sodium chloride flush, sorbitol, zolpidem   Vital Signs    Vitals:   12/30/20 0013 12/30/20 0226 12/30/20 0325 12/30/20 0749  BP:   (!) 138/52 (!) 149/60  Pulse:  67 66 74  Resp:  $Remo'17 11 16  'Qacfm$ Temp:   98.7 F (37.1 C) 98.4 F (36.9 C)  TempSrc:   Oral Oral  SpO2: 94% 97% 98% 95%  Weight:      Height:        Intake/Output Summary (Last 24 hours) at 12/30/2020 0917 Last data filed at 12/30/2020 0325 Gross per 24 hour  Intake 777.3 ml  Output --  Net 777.3 ml   Filed Weights   12/27/20 0915 12/27/20 1544  Weight: 57.6 kg 57.8 kg    Telemetry    Sinus rhythm rate 72 - Personally Reviewed  ECG    12/28/20  A fib 105 with Nonspecific St changes - Personally Reviewed  Physical Exam   GEN: No acute distress.   Neck: No JVD, Trach site c/di Cardiac: RRR, no murmurs, rubs, or gallops.  Respiratory: Clear to auscultation bilaterally. GI: Soft, nontender, non-distended  MS: No edema; No deformity. Neuro:  Nonfocal  Psych: Normal affect   Labs    Chemistry Recent Labs  Lab 12/26/20 2030 12/28/20 0028 12/29/20  0103 12/30/20 0021  NA 139 140 139 139  K 4.9 4.4 4.8 4.6  CL 109 108 109 108  CO2 18* 22 21* 20*  GLUCOSE 124* 92 115* 110*  BUN 50* 45* 48* 53*  CREATININE 2.10* 1.98* 2.34* 2.23*  CALCIUM 8.9 9.0 9.1 8.8*  PROT 6.2*  --   --   --   ALBUMIN 3.5  --   --   --   AST 21  --   --   --   ALT 19  --   --   --   ALKPHOS 53  --   --   --   BILITOT 0.3  --   --   --   GFRNONAA 24* 26* 21* 22*  ANIONGAP $RemoveB'12 10 9 11     'qpVhgESH$ Hematology Recent Labs  Lab 12/28/20 0028 12/29/20 0103 12/30/20 0021  WBC 6.6 7.9 7.4  RBC 3.26* 3.24* 3.16*  HGB 9.3* 9.4* 8.9*  HCT 28.5* 28.1* 28.0*  MCV 87.4 86.7 88.6  MCH 28.5 29.0 28.2  MCHC 32.6 33.5 31.8  RDW 13.0 13.1 13.0  PLT 211 192 188    Cardiac EnzymesNo results for input(s): TROPONINI in  the last 168 hours. No results for input(s): TROPIPOC in the last 168 hours.   BNPNo results for input(s): BNP, PROBNP in the last 168 hours.   DDimer No results for input(s): DDIMER in the last 168 hours.   Radiology    VAS [place] LOWER EXTREMITY VENOUS (DVT)  Result Date: 12/30/2020  Lower Venous DVT Study Indications: Edema.  Comparison Study: no prior Performing Technologist: Abram Sander RVS  Examination Guidelines: A complete evaluation includes B-mode imaging, spectral Doppler, color Doppler, and power Doppler as needed of all accessible portions of each vessel. Bilateral testing is considered an integral part of a complete examination. Limited examinations for reoccurring indications may be performed as noted. The reflux portion of the exam is performed with the patient in reverse Trendelenburg.  +-----+---------------+---------+-----------+----------+--------------+ RIGHTCompressibilityPhasicitySpontaneityPropertiesThrombus Aging +-----+---------------+---------+-----------+----------+--------------+ CFV  Full           Yes      Yes                                 +-----+---------------+---------+-----------+----------+--------------+   +---------+---------------+---------+-----------+----------+--------------+ LEFT     CompressibilityPhasicitySpontaneityPropertiesThrombus Aging +---------+---------------+---------+-----------+----------+--------------+ CFV      Full           Yes      Yes                                 +---------+---------------+---------+-----------+----------+--------------+ SFJ      Full                                                        +---------+---------------+---------+-----------+----------+--------------+ FV Prox  Full                                                        +---------+---------------+---------+-----------+----------+--------------+ FV Mid   Full                                                         +---------+---------------+---------+-----------+----------+--------------+  FV DistalFull                                                        +---------+---------------+---------+-----------+----------+--------------+ PFV      Full                                                        +---------+---------------+---------+-----------+----------+--------------+ POP      Full           Yes      Yes                                 +---------+---------------+---------+-----------+----------+--------------+ PTV      Full                                                        +---------+---------------+---------+-----------+----------+--------------+ PERO     Full                                                        +---------+---------------+---------+-----------+----------+--------------+     Summary: RIGHT: - No evidence of common femoral vein obstruction.  LEFT: - There is no evidence of deep vein thrombosis in the lower extremity.  - No cystic structure found in the popliteal fossa.  *See table(s) above for measurements and observations. Electronically signed by Servando Snare MD on 12/30/2020 at 7:22:56 AM.    Final     Cardiac Studies   Transthoracic Echocardiogram: Date: 12/30/20 Results: FINDINGS   Left Ventricle: Left ventricular ejection fraction, by estimation, is 55  to 60%. The left ventricle has normal function. The left ventricle has no  regional wall motion abnormalities. The left ventricular internal cavity  size was small. There is no left  ventricular hypertrophy. Left ventricular diastolic parameters are  indeterminate.  Right Ventricle: The right ventricular size is normal. No increase in  right ventricular wall thickness. Right ventricular systolic function is  normal.  Left Atrium: Left atrial size was normal in size.  Right Atrium: Right atrial size was normal in size.  Pericardium: There is no evidence of pericardial effusion.  Mitral  Valve: The mitral valve is grossly normal. Mild mitral annular  calcification. No evidence of mitral valve regurgitation.  Tricuspid Valve: The tricuspid valve is grossly normal. Tricuspid valve  regurgitation is not demonstrated.  Aortic Valve: The aortic valve is tricuspid. There is mild aortic valve  annular calcification. Aortic valve regurgitation is not visualized. No  aortic stenosis is present.  Pulmonic Valve: The pulmonic valve was grossly normal. Pulmonic valve  regurgitation is trivial.  Aorta: The aortic root and ascending aorta are structurally normal, with  no evidence of dilitation.  IAS/Shunts: The atrial septum is grossly normal.   NM Stress Testing : Date: 12/30/20 Results:  Cancelled   Patient Profile     75 y.o. female a troponin elevation in the setting of a myriad of symptoms  Assessment & Plan    NSTEMI CKD IV PAD and HLD HTN and anxiety - continue ASA and heparin  - given a creatinine of 2.3; will plan for conservative management unless high risk study; if positive will need 48 hours off of eliquis and on heparin instead - continue Cilostazol 100 mg PO BID; no evidence of LV dysfunction - continue BB - SHARED DECISION MAKING:  Patient has significant risk of needing HD if pursuing LHC, but with novel troponin elevation and atypical CP with risk factors, and inability to perform high quality NM Stress due to HTN would recommend the following:  IMDUR 30 mg, return beta blocker (dose held in the AM for NM Stress).   We will plan on medically managing possible CAD. Will cycle new troponin.  If unable to adequately control CP syndrome, will pursue diagnostic LHC on 01/01/21.  Patient and DIL aware of the risks of kidney dysfunction:  discussed with patient with interpretor, nursing and APP team.  Reaching out to primary doctor  CRITICAL CARE Performed by: Pearlie Lafosse A Leilyn Frayre  Total critical care time: 45 minutes. Critical care time was exclusive of separately  billable procedures and treating other patients. Critical care was necessary to treat or prevent imminent or life-threatening deterioration. Critical care was time spent personally by me on the following activities: development of treatment plan with patient and/or surrogate as well as nursing, discussions with consultants, evaluation of patient's response to treatment, examination of patient, obtaining history from patient or surrogate, ordering and performing treatments and interventions, ordering and review of laboratory studies, ordering and review of radiographic studies, pulse oximetry and re-evaluation of patient's condition.    Signed, Rudean Haskell, MD [place]  12/30/2020 12:15 PM      For questions or updates, please contact [place] Please consult [email or web address] for contact info under Cardiology/STEMI.      Signed, Werner Lean, MD  12/30/2020, 9:17 AM

## 2020-12-30 NOTE — Progress Notes (Signed)
PROGRESS NOTE    LITHZY BERNARD  WPV:[id number] DOB: 09-25-1946 DOA: 12/26/2020 PCP: Janith Lima, MD   Brief Narrative: 75 year old with past medical history significant for laryngeal cancer status post laryngectomy 2013 and status post chemo and radiation, hypertension, GERD, COPD, anxiety disorder and recent diagnosis of DVT earlier this month now on Eliquis who had an episode of intense diaphoresis, headache, neck pain and abdominal pain.  EMS was contacted who stated patient systolic blood pressures range was  between 170-240 in route.  Patient seemed to improve after Ativan given by EMS.  - On arrival troponin was found to be 684, 681.  Patient was chest pain-free.  Cardiology has been consulted and plan is for stress test.     Assessment & Plan:   Principal Problem:   ACS (acute coronary syndrome) ([place]) Active Problems:   Hypertension   GAD (generalized anxiety disorder)   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   COPD (chronic obstructive pulmonary disease) (H[facility])   Hyperlipidemia LDL goal <130   CRI (chronic renal insufficiency), stage 4 (severe) ([facility])   PAD (peripheral artery disease) ([place])   Other specified hypothyroidism  1-Elevation of troponin, Chest pain, abdominal pain, diaphoresis.  VQ scan negative for PE>  For stress test tomorrow.  Could be related to CKD and Hypertensive urgency. Or could be related to A fib.  She was not able to tolerate stress test, got very anxious, her BP increase to 240.  Plan is for Heart cath possible on Wednesday.   2-CKD stage IV: Avoid nephrotoxin medication.  Hold ACE>  Prior cr range: 1.7--1.9 Mild increase cr, poor oral intake.  Continue with IV fluids.  She is at risk for worsening renal function. Discussed with nephrology recommendation is for IV fluids prior to cath.   3-PAF; episode of RVR;  Had episode of chest pain yesterday while she was in A fib RVR.  Received IV metoprolol.  Continue with heparin,  metoprolol.  She is chest pain free.  recent DVT: left On heparin gtt. Transition to eliquis if not cath is planned.  Complaint of pain right leg; doppler negative.   Anxiety: Continue with ativan.   Renal mass: Multiple bilateral simple renal cyst,  Follow up out patient.   Hypertensive urgency ; Hypertension: BP check by EMS was 240.  BP was down when she was in the ED.  Continue with metoprolol.  Continue with Amlodipine,  Hold HCTZ and olmesartan.  Received hydralazine today, started on Imdur.   Hyperlipidemia; Continue with crestor.   Hypothyroidism Continue with synthroid,.  TSH 6.8 Her synthroid dose was recently increase to 50 mcg. Continue with current dose.   AAA, infrarenal; Needs FU US> 5 years  COPD, history of supraglottic cancer with trach: Will consult trach team.   PAD;  Continue with pletal.     Estimated body mass index is 21.87 kg/m as calculated from the following:   Height as of this encounter: 5\' 4"  (1.626 m).   Weight as of this encounter: 57.8 kg.   DVT prophylaxis: Heparin Code Status: Full code Family Communication: son/daughter in Sports [employer].  Disposition Plan:  Status is: Inpatient  Remains inpatient appropriate because:Ongoing diagnostic testing needed not appropriate for outpatient work up   Dispo: The patient is from: Home              Anticipated d/c is to: Home              Anticipated d/c date is: 2  days              Patient currently is not medically stable to d/c.        Consultants:   Cardiology   Procedures:   VQ scan  Antimicrobials:    Subjective: Seen earlier this am. Denies chest pain.   Objective: Vitals:   12/30/20 1106 12/30/20 1308 12/30/20 1315 12/30/20 1350  BP: (!) 236/91 (!) 223/79 (!) 161/91 (!) 150/79  Pulse:  90 93 92  Resp:  (!) 32 (!) 24 (!) 30  Temp:    98.6 F (37 C)  TempSrc:      SpO2:  96% 96% 96%  Weight:      Height:        Intake/Output Summary (Last 24 hours) at  12/30/2020 1505 Last data filed at 12/30/2020 0325 Gross per 24 hour  Intake 111.52 ml  Output -  Net 111.52 ml   Filed Weights   12/27/20 0915 12/27/20 1544  Weight: 57.6 kg 57.8 kg    Examination:  General exam: NAD Respiratory system: CTA, Trach in place Cardiovascular system: S 1, S 2 RRR Gastrointestinal system: BS present, soft, nt Central nervous system: Alert  Extremities: Symmetric power Skin: no rashes   Data Reviewed: I have personally reviewed following labs and imaging studies  CBC: Recent Labs  Lab 12/26/20 2030 12/28/20 0028 12/29/20 0103 12/30/20 0021  WBC 9.2 6.6 7.9 7.4  NEUTROABS 7.1  --   --   --   HGB 9.3* 9.3* 9.4* 8.9*  HCT 29.4* 28.5* 28.1* 28.0*  MCV 89.6 87.4 86.7 88.6  PLT 227 211 192 756   Basic Metabolic Panel: Recent Labs  Lab 12/26/20 2030 12/28/20 0028 12/29/20 0103 12/30/20 0021  NA 139 140 139 139  K 4.9 4.4 4.8 4.6  CL 109 108 109 108  CO2 18* 22 21* 20*  GLUCOSE 124* 92 115* 110*  BUN 50* 45* 48* 53*  CREATININE 2.10* 1.98* 2.34* 2.23*  CALCIUM 8.9 9.0 9.1 8.8*   GFR: Estimated Creatinine Clearance: 18.8 mL/min (A) (by C-G formula based on SCr of 2.23 mg/dL (H)). Liver Function Tests: Recent Labs  Lab 12/26/20 2030  AST 21  ALT 19  ALKPHOS 53  BILITOT 0.3  PROT 6.2*  ALBUMIN 3.5   No results for input(s): LIPASE, AMYLASE in the last 168 hours. No results for input(s): AMMONIA in the last 168 hours. Coagulation Profile: Recent Labs  Lab 12/28/20 0028  INR 1.2   Cardiac Enzymes: No results for input(s): CKTOTAL, CKMB, CKMBINDEX, TROPONINI in the last 168 hours. BNP (last 3 results) No results for input(s): PROBNP in the last 8760 hours. HbA1C: No results for input(s): HGBA1C in the last 72 hours. CBG: No results for input(s): GLUCAP in the last 168 hours. Lipid Profile: No results for input(s): CHOL, HDL, LDLCALC, TRIG, CHOLHDL, LDLDIRECT in the last 72 hours. Thyroid Function Tests: No results for  input(s): TSH, T4TOTAL, FREET4, T3FREE, THYROIDAB in the last 72 hours. Anemia Panel: No results for input(s): VITAMINB12, FOLATE, FERRITIN, TIBC, IRON, RETICCTPCT in the last 72 hours. Sepsis Labs: No results for input(s): PROCALCITON, LATICACIDVEN in the last 168 hours.  Recent Results (from the past 240 hour(s))  SARS CORONAVIRUS 2 (TAT 6-24 HRS) Nasopharyngeal Nasopharyngeal Swab     Status: None   Collection Time: 12/27/20  4:28 AM   Specimen: Nasopharyngeal Swab  Result Value Ref Range Status   SARS Coronavirus 2 NEGATIVE NEGATIVE Final    Comment: (NOTE)  SARS-CoV-2 target nucleic acids are NOT DETECTED.  The SARS-CoV-2 RNA is generally detectable in upper and lower respiratory specimens during the acute phase of infection. Negative results do not preclude SARS-CoV-2 infection, do not rule out co-infections with other pathogens, and should not be used as the sole basis for treatment or other patient management decisions. Negative results must be combined with clinical observations, patient history, and epidemiological information. The expected result is Negative.  Fact Sheet for Patients: SugarRoll.be  Fact Sheet for Healthcare Providers: https://www.woods-mathews.com/  This test is not yet approved or cleared by the [place] FDA and  has been authorized for detection and/or diagnosis of SARS-CoV-2 by FDA under an Emergency Use Authorization (EUA). This EUA will remain  in effect (meaning this test can be used) for the duration of the COVID-19 declaration under Se ction 564(b)(1) of the Act, 21 U.S.C. section 360bbb-3(b)(1), unless the authorization is terminated or revoked sooner.  Performed at [facility], [place] [address], [place], [place] [address]   MRSA PCR Screening     Status: None   Collection Time: 12/27/20  3:47 PM   Specimen: Nasal Mucosa; Nasopharyngeal  Result Value Ref Range Status   MRSA by PCR  NEGATIVE NEGATIVE Final    Comment:        The GeneXpert MRSA Assay (FDA approved for NASAL specimens only), is one component of a comprehensive MRSA colonization surveillance program. It is not intended to diagnose MRSA infection nor to guide or monitor treatment for MRSA infections. Performed at [facility], [place] [address] [place], [place] [address]          Radiology Studies: VAS [place] LOWER EXTREMITY VENOUS (DVT)  Result Date: 12/30/2020  Lower Venous DVT Study Indications: Edema.  Comparison Study: no prior Performing Technologist: Abram Sander RVS  Examination Guidelines: A complete evaluation includes B-mode imaging, spectral Doppler, color Doppler, and power Doppler as needed of all accessible portions of each vessel. Bilateral testing is considered an integral part of a complete examination. Limited examinations for reoccurring indications may be performed as noted. The reflux portion of the exam is performed with the patient in reverse Trendelenburg.  +-----+---------------+---------+-----------+----------+--------------+ RIGHTCompressibilityPhasicitySpontaneityPropertiesThrombus Aging +-----+---------------+---------+-----------+----------+--------------+ CFV  Full           Yes      Yes                                 +-----+---------------+---------+-----------+----------+--------------+   +---------+---------------+---------+-----------+----------+--------------+ LEFT     CompressibilityPhasicitySpontaneityPropertiesThrombus Aging +---------+---------------+---------+-----------+----------+--------------+ CFV      Full           Yes      Yes                                 +---------+---------------+---------+-----------+----------+--------------+ SFJ      Full                                                        +---------+---------------+---------+-----------+----------+--------------+ FV Prox  Full                                                         +---------+---------------+---------+-----------+----------+--------------+  FV Mid   Full                                                        +---------+---------------+---------+-----------+----------+--------------+ FV DistalFull                                                        +---------+---------------+---------+-----------+----------+--------------+ PFV      Full                                                        +---------+---------------+---------+-----------+----------+--------------+ POP      Full           Yes      Yes                                 +---------+---------------+---------+-----------+----------+--------------+ PTV      Full                                                        +---------+---------------+---------+-----------+----------+--------------+ PERO     Full                                                        +---------+---------------+---------+-----------+----------+--------------+     Summary: RIGHT: - No evidence of common femoral vein obstruction.  LEFT: - There is no evidence of deep vein thrombosis in the lower extremity.  - No cystic structure found in the popliteal fossa.  *See table(s) above for measurements and observations. Electronically signed by Servando Snare MD on 12/30/2020 at 7:22:56 AM.    Final         Scheduled Meds: . aspirin EC  81 mg Oral Daily  . capsaicin   Topical BID  . cilostazol  100 mg Oral BID  . escitalopram  20 mg Oral Daily  . famotidine  20 mg Oral Daily  . fluticasone  2 spray Each Nare Daily  . folic acid  1 mg Oral Daily  . hydrALAZINE      . isosorbide mononitrate  30 mg Oral Daily  . levothyroxine  50 mcg Oral QAC breakfast  . LORazepam  1 mg Oral Daily  . metoprolol tartrate  50 mg Oral BID  . morphine      . regadenoson  0.4 mg Intravenous Once  . rosuvastatin  20 mg Oral Daily  . sodium chloride flush  3 mL Intravenous Q12H  . thiamine   100 mg Oral Daily   Continuous Infusions: . sodium chloride    . sodium chloride 75 mL/hr at 12/30/20 0758  . heparin 900 Units/hr (  12/30/20 0325)     LOS: 3 days    Time spent: 35 minutes.     Elmarie Shiley, MD [facility]   If 7PM-7AM, please contact night-coverage [email or web address]  12/30/2020, 3:05 PM

## 2020-12-31 DIAGNOSIS — I739 Peripheral vascular disease, unspecified: Secondary | ICD-10-CM | POA: Diagnosis not present

## 2020-12-31 DIAGNOSIS — I214 Non-ST elevation (NSTEMI) myocardial infarction: Secondary | ICD-10-CM | POA: Diagnosis not present

## 2020-12-31 LAB — BASIC METABOLIC PANEL
Anion gap: 12 (ref 5–15)
BUN: 41 mg/dL — ABNORMAL HIGH (ref 8–23)
CO2: 18 mmol/L — ABNORMAL LOW (ref 22–32)
Calcium: 8.4 mg/dL — ABNORMAL LOW (ref 8.9–10.3)
Chloride: 109 mmol/L (ref 98–111)
Creatinine, Ser: 1.98 mg/dL — ABNORMAL HIGH (ref 0.44–1.00)
GFR, Estimated: 26 mL/min — ABNORMAL LOW (ref >60)
Glucose, Bld: 107 mg/dL — ABNORMAL HIGH (ref 70–99)
Potassium: 3.9 mmol/L (ref 3.5–5.1)
Sodium: 139 mmol/L (ref 135–145)

## 2020-12-31 LAB — CBC
HCT: 25.5 % — ABNORMAL LOW (ref 36.0–46.0)
Hemoglobin: 8.5 g/dL — ABNORMAL LOW (ref 12.0–15.0)
MCH: 28.9 pg (ref 26.0–34.0)
MCHC: 33.3 g/dL (ref 30.0–36.0)
MCV: 86.7 fL (ref 80.0–100.0)
Platelets: 175 10*3/uL (ref 150–400)
RBC: 2.94 MIL/uL — ABNORMAL LOW (ref 3.87–5.11)
RDW: 13.2 % (ref 11.5–15.5)
WBC: 6.8 10*3/uL (ref 4.0–10.5)
nRBC: 0 % (ref 0.0–0.2)

## 2020-12-31 LAB — HEPARIN LEVEL (UNFRACTIONATED): Heparin Unfractionated: 0.75 IU/mL — ABNORMAL HIGH (ref 0.30–0.70)

## 2020-12-31 LAB — APTT: aPTT: 77 seconds — ABNORMAL HIGH (ref 24–36)

## 2020-12-31 MED ORDER — HYDRALAZINE HCL 25 MG PO TABS
25.0000 mg | ORAL_TABLET | Freq: Three times a day (TID) | ORAL | Status: DC
  Administered 2020-12-31 (×2): 25 mg via ORAL
  Filled 2020-12-31 (×2): qty 1

## 2020-12-31 MED ORDER — HYDRALAZINE HCL 10 MG PO TABS
10.0000 mg | ORAL_TABLET | Freq: Two times a day (BID) | ORAL | Status: DC
  Administered 2020-12-31: 10 mg via ORAL
  Filled 2020-12-31: qty 1

## 2020-12-31 NOTE — Plan of Care (Signed)

## 2020-12-31 NOTE — Progress Notes (Addendum)
PROGRESS NOTE    [name]  BZJ:[id number] DOB: Nov 14, 1946 DOA: 12/26/2020 PCP: Janith Lima, MD   Brief Narrative: 75 year old with past medical history significant for laryngeal cancer status post laryngectomy 2013 and status post chemo and radiation, hypertension, GERD, COPD, anxiety disorder and recent diagnosis of DVT earlier this month now on Eliquis who had an episode of intense diaphoresis, headache, neck pain and abdominal pain.  EMS was contacted who stated patient systolic blood pressures range was  between 170-240 in route.  Patient seemed to improve after Ativan given by EMS.  - On arrival troponin was found to be 684, 681.  Patient was chest pain-free.  Cardiology has been consulted and initial plan was for stress test. Patient prior to stress test got anxious, her BP increase to 240. Stress test was cancelled. Cardiology is planing Medical Management Vs Cath.      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  VQ scan negative for PE>  For stress test tomorrow.  Could be related to CKD and Hypertensive urgency. Or could be related to A fib.  She was not able to tolerate stress test, got very anxious, her BP increase to 240.  Cardiology is planning medical management vs Cath (if continue to have chest pain despite well BP controlled might need Cath).   2-CKD stage IV: Avoid nephrotoxin medication.  Hold ACE>  Prior cr range: 1.7--1.9 Mild increase cr, poor oral intake.  Continue with IV fluids.  She is at risk for  worsening renal function. Discussed with nephrology recommendation is for IV fluids prior to cath.  Cr down to 1.9 form 2.3.   3-PAF; episode of RVR;  Had episode of chest pain yesterday while she was in A fib RVR.  Received IV metoprolol.  Continue with heparin, metoprolol.    recent DVT: left On heparin gtt. Transition to eliquis if not cath is planned.  Complaint of pain right leg; doppler negative.   Anxiety: Continue with ativan.   Renal mass: Multiple bilateral simple renal cyst,  Follow up out patient.   Hypertensive urgency ; Hypertension: BP check by EMS was 240.  BP was down when she was in the ED.  Continue with metoprolol.  Hold HCTZ and olmesartan, Norvasc.  Started on Hydralazine and Imdur.  Will check Metanephrine in 24 hour to evaluate for pheochromocytoma.  BP elevated this afternoon, will increase hydralazine to 25 mg TID.   Hyperlipidemia; Continue with crestor.   Hypothyroidism Continue with synthroid,.  TSH 6.8 Her synthroid dose was recently increase to 50 mcg. Continue with current dose.   AAA, infrarenal; Needs FU US> 5 years  COPD, history of supraglottic cancer with trach: Will consult trach team.   PAD;  Continue with pletal.     Estimated body mass index is 21.87 kg/m as calculated from the following:   Height as of this encounter: 5\' 4"  (1.626 m).   Weight as of this encounter: 57.8 kg.   DVT prophylaxis: Heparin Code Status: Full code Family Communication: son/daughter in law 1/16. Son and daughter in Sports coach  1/18  Disposition Plan:  Status is: Inpatient  Remains inpatient appropriate because:Ongoing diagnostic testing needed not appropriate for outpatient work up   Dispo: The patient is from: Home              Anticipated d/c is to: Home              Anticipated d/c date is: 2 days              Patient currently is not medically stable to d/c.        Consultants:   Cardiology   Procedures:   VQ  scan  Antimicrobials:    Subjective: She feels better this am.  Pain Medication helps controlled pain in her leg.  Denies chest pain.  She is eating more.   Objective: Vitals:   12/30/20 1659 12/30/20 2015 12/31/20 0008 12/31/20 0302  BP: (!) 126/50 (!) 147/64 (!) 154/91 (!) 152/66  Pulse: 73 75 79 71  Resp: 19 20 20 19   Temp:  98.6 F (37 C) 99.2 F (37.3 C) 98.5 F (36.9 C)  TempSrc:  Oral Oral Oral  SpO2: 98% 98% 98% 98%  Weight:      Height:        Intake/Output Summary (Last 24 hours) at 12/31/2020 0643 Last data filed at 12/31/2020 0400 Gross per 24 hour  Intake 2200.1 ml  Output --  Net 2200.1 ml   Filed Weights   12/27/20 0915 12/27/20 1544  Weight: 57.6 kg 57.8 kg    Examination:  General exam: NAD Respiratory system: Trach in place. CTA Cardiovascular system: S 1, S 2  RRR Gastrointestinal system: BS present, soft, nt Central nervous system: Alert, following command.  Extremities: Symmetric power.  Skin: No rashes.    Data Reviewed: I have personally reviewed following labs and imaging studies  CBC: Recent Labs  Lab 12/26/20 2030 12/28/20 0028 12/29/20 0103 12/30/20 0021 12/31/20 0025  WBC 9.2 6.6 7.9 7.4 6.8  NEUTROABS 7.1  --   --   --   --   HGB 9.3* 9.3* 9.4* 8.9* 8.5*  HCT 29.4* 28.5* 28.1* 28.0* 25.5*  MCV 89.6 87.4 86.7 88.6 86.7  PLT 227 211 192 188 448   Basic Metabolic Panel: Recent Labs  Lab 12/26/20 2030 12/28/20 0028 12/29/20 0103 12/30/20 0021  NA 139 140 139 139  K 4.9 4.4 4.8 4.6  CL 109 108 109 108  CO2 18* 22 21* 20*  GLUCOSE 124* 92 115* 110*  BUN 50* 45* 48* 53*  CREATININE 2.10* 1.98* 2.34* 2.23*  CALCIUM 8.9 9.0 9.1 8.8*   GFR: Estimated Creatinine Clearance: 18.8 mL/min (A) (by C-G formula based on SCr of 2.23 mg/dL (H)). Liver Function Tests: Recent Labs  Lab 12/26/20 2030  AST 21  ALT 19  ALKPHOS 53  BILITOT 0.3  PROT 6.2*  ALBUMIN 3.5   No results for input(s): LIPASE, AMYLASE in the last  168 hours. No results for input(s): AMMONIA in the last 168 hours. Coagulation Profile: Recent Labs  Lab 12/28/20 0028  INR 1.2   Cardiac Enzymes: No results for input(s): CKTOTAL, CKMB, CKMBINDEX, TROPONINI in the last 168 hours. BNP (last 3 results) No results for input(s): PROBNP in the last 8760 hours. HbA1C: No results for input(s): HGBA1C in the last 72 hours. CBG: No results for input(s): GLUCAP in the last 168 hours. Lipid Profile: No results for input(s): CHOL, HDL, LDLCALC, TRIG, CHOLHDL, LDLDIRECT in the last 72 hours. Thyroid Function Tests:  No results for input(s): TSH, T4TOTAL, FREET4, T3FREE, THYROIDAB in the last 72 hours. Anemia Panel: No results for input(s): VITAMINB12, FOLATE, FERRITIN, TIBC, IRON, RETICCTPCT in the last 72 hours. Sepsis Labs: No results for input(s): PROCALCITON, LATICACIDVEN in the last 168 hours.  Recent Results (from the past 240 hour(s))  SARS CORONAVIRUS 2 (TAT 6-24 HRS) Nasopharyngeal Nasopharyngeal Swab     Status: None   Collection Time: 12/27/20  4:28 AM   Specimen: Nasopharyngeal Swab  Result Value Ref Range Status   SARS Coronavirus 2 NEGATIVE NEGATIVE Final    Comment: (NOTE) SARS-CoV-2 target nucleic acids are NOT DETECTED.  The SARS-CoV-2 RNA is generally detectable in upper and lower respiratory specimens during the acute phase of infection. Negative results do not preclude SARS-CoV-2 infection, do not rule out co-infections with other pathogens, and should not be used as the sole basis for treatment or other patient management decisions. Negative results must be combined with clinical observations, patient history, and epidemiological information. The expected result is Negative.  Fact Sheet for Patients: SugarRoll.be  Fact Sheet for Healthcare Providers: https://www.woods-mathews.com/  This test is not yet approved or cleared by the [place] FDA and  has been  authorized for detection and/or diagnosis of SARS-CoV-2 by FDA under an Emergency Use Authorization (EUA). This EUA will remain  in effect (meaning this test can be used) for the duration of the COVID-19 declaration under Se ction 564(b)(1) of the Act, 21 U.S.C. section 360bbb-3(b)(1), unless the authorization is terminated or revoked sooner.  Performed at [facility], [place] [address], [place], [place] [address]   MRSA PCR Screening     Status: None   Collection Time: 12/27/20  3:47 PM   Specimen: Nasal Mucosa; Nasopharyngeal  Result Value Ref Range Status   MRSA by PCR NEGATIVE NEGATIVE Final    Comment:        The GeneXpert MRSA Assay (FDA approved for NASAL specimens only), is one component of a comprehensive MRSA colonization surveillance program. It is not intended to diagnose MRSA infection nor to guide or monitor treatment for MRSA infections. Performed at [facility], [place] [address], [place], [place] [address]          Radiology Studies: VAS [place] LOWER EXTREMITY VENOUS (DVT)  Result Date: 12/30/2020  Lower Venous DVT Study Indications: Edema.  Comparison Study: no prior Performing Technologist: Abram Sander RVS  Examination Guidelines: A complete evaluation includes B-mode imaging, spectral Doppler, color Doppler, and power Doppler as needed of all accessible portions of each vessel. Bilateral testing is considered an integral part of a complete examination. Limited examinations for reoccurring indications may be performed as noted. The reflux portion of the exam is performed with the patient in reverse Trendelenburg.  +-----+---------------+---------+-----------+----------+--------------+ RIGHTCompressibilityPhasicitySpontaneityPropertiesThrombus Aging +-----+---------------+---------+-----------+----------+--------------+ CFV  Full           Yes      Yes                                  +-----+---------------+---------+-----------+----------+--------------+   +---------+---------------+---------+-----------+----------+--------------+ LEFT     CompressibilityPhasicitySpontaneityPropertiesThrombus Aging +---------+---------------+---------+-----------+----------+--------------+ CFV      Full           Yes      Yes                                 +---------+---------------+---------+-----------+----------+--------------+  SFJ      Full                                                        +---------+---------------+---------+-----------+----------+--------------+ FV Prox  Full                                                        +---------+---------------+---------+-----------+----------+--------------+ FV Mid   Full                                                        +---------+---------------+---------+-----------+----------+--------------+ FV DistalFull                                                        +---------+---------------+---------+-----------+----------+--------------+ PFV      Full                                                        +---------+---------------+---------+-----------+----------+--------------+ POP      Full           Yes      Yes                                 +---------+---------------+---------+-----------+----------+--------------+ PTV      Full                                                        +---------+---------------+---------+-----------+----------+--------------+ PERO     Full                                                        +---------+---------------+---------+-----------+----------+--------------+     Summary: RIGHT: - No evidence of common femoral vein obstruction.  LEFT: - There is no evidence of deep vein thrombosis in the lower extremity.  - No cystic structure found in the popliteal fossa.  *See table(s) above for measurements and observations. Electronically signed  by Servando Snare MD on 12/30/2020 at 7:22:56 AM.    Final         Scheduled Meds: . aspirin EC  81 mg Oral Daily  . capsaicin   Topical BID  . cilostazol  100 mg Oral BID  . escitalopram  20 mg Oral Daily  . famotidine  20 mg Oral Daily  .  fluticasone  2 spray Each Nare Daily  . folic acid  1 mg Oral Daily  . isosorbide mononitrate  30 mg Oral Daily  . levothyroxine  50 mcg Oral QAC breakfast  . LORazepam  1 mg Oral Daily  . metoprolol tartrate  50 mg Oral BID  . regadenoson  0.4 mg Intravenous Once  . rosuvastatin  20 mg Oral Daily  . sodium chloride flush  3 mL Intravenous Q12H  . thiamine  100 mg Oral Daily   Continuous Infusions: . sodium chloride    . sodium chloride 75 mL/hr at 12/31/20 0400  . heparin 900 Units/hr (12/31/20 0400)     LOS: 4 days    Time spent: 35 minutes.     Elmarie Shiley, MD [facility]   If 7PM-7AM, please contact night-coverage [email or web address]  12/31/2020, 6:43 AM

## 2020-12-31 NOTE — Progress Notes (Signed)
Progress Note  Patient Name: [name] Date of Encounter: 12/31/2020  Primary Cardiologist: Mertie Moores, MD   Subjective   75 yo F with a history of supraglottic squamous cell carcinoma s/p chronic tracheostomy with chemotherapy NOS at [facility] with radiation < 30 Gy with chest wall not target organ, PAD, recent DVT 12/20/20, and chronic anemia and CKD Stage IV who presented for evaluation in the setting of troponin elevation.  Negative V/Q scan Stress test called off for HTN.  Patient notes that she is doing well.  Since day prior notes that she has some rest.  Anxiety has decreased.  No chest pain or pressure.  No SOB/DOE and no PND/Orthopnea.  No weight gain or leg swelling.  No palpitations or syncope.  Inpatient Medications    Scheduled Meds: . aspirin EC  81 mg Oral Daily  . capsaicin   Topical BID  . cilostazol  100 mg Oral BID  . escitalopram  20 mg Oral Daily  . famotidine  20 mg Oral Daily  . fluticasone  2 spray Each Nare Daily  . folic acid  1 mg Oral Daily  . isosorbide mononitrate  30 mg Oral Daily  . levothyroxine  50 mcg Oral QAC breakfast  . LORazepam  1 mg Oral Daily  . metoprolol tartrate  50 mg Oral BID  . regadenoson  0.4 mg Intravenous Once  . rosuvastatin  20 mg Oral Daily  . sodium chloride flush  3 mL Intravenous Q12H  . thiamine  100 mg Oral Daily   Continuous Infusions: . sodium chloride    . sodium chloride 75 mL/hr at 12/31/20 0400  . heparin 900 Units/hr (12/31/20 0400)   PRN Meds: sodium chloride, acetaminophen **OR** acetaminophen, calcium carbonate (dosed in mg elemental calcium), camphor-menthol **AND** hydrOXYzine, diphenoxylate-atropine, docusate sodium, feeding supplement (NEPRO CARB STEADY), hydrALAZINE, HYDROcodone-acetaminophen, morphine injection, nitroGLYCERIN, ondansetron **OR** ondansetron (ZOFRAN) IV, sodium chloride flush, sorbitol, zolpidem   Vital Signs    Vitals:   12/31/20 0008 12/31/20 0302 12/31/20 0700 12/31/20  0844  BP: (!) 154/91 (!) 152/66 (!) 151/53   Pulse: 79 71 72 71  Resp: $Remo'20 19 17 15  'IQeLf$ Temp: 99.2 F (37.3 C) 98.5 F (36.9 C) 98.4 F (36.9 C)   TempSrc: Oral Oral Oral   SpO2: 98% 98% 96% 94%  Weight:      Height:        Intake/Output Summary (Last 24 hours) at 12/31/2020 0904 Last data filed at 12/31/2020 0400 Gross per 24 hour  Intake 2200.1 ml  Output -  Net 2200.1 ml   Filed Weights   12/27/20 0915 12/27/20 1544  Weight: 57.6 kg 57.8 kg    Telemetry    Sinus rhythm - Personally Reviewed  ECG    No new - Personally Reviewed  Physical Exam   GEN: No acute distress.   Neck: No JVD, Trach site c/di Cardiac: RRR, no murmurs, rubs, or gallops.  Respiratory: Clear to auscultation bilaterally. GI: Soft, nontender, non-distended  MS: No edema; No deformity. Neuro:  Nonfocal  Psych: Normal affect   Labs    Chemistry Recent Labs  Lab 12/26/20 2030 12/28/20 0028 12/29/20 0103 12/30/20 0021 12/31/20 0025  NA 139   < > 139 139 139  K 4.9   < > 4.8 4.6 3.9  CL 109   < > 109 108 109  CO2 18*   < > 21* 20* 18*  GLUCOSE 124*   < > 115* 110* 107*  BUN 50*   < > 48* 53* 41*  CREATININE 2.10*   < > 2.34* 2.23* 1.98*  CALCIUM 8.9   < > 9.1 8.8* 8.4*  PROT 6.2*  --   --   --   --   ALBUMIN 3.5  --   --   --   --   AST 21  --   --   --   --   ALT 19  --   --   --   --   ALKPHOS 53  --   --   --   --   BILITOT 0.3  --   --   --   --   GFRNONAA 24*   < > 21* 22* 26*  ANIONGAP 12   < > $R'9 11 12   'YW$ < > = values in this interval not displayed.     Hematology Recent Labs  Lab 12/29/20 0103 12/30/20 0021 12/31/20 0025  WBC 7.9 7.4 6.8  RBC 3.24* 3.16* 2.94*  HGB 9.4* 8.9* 8.5*  HCT 28.1* 28.0* 25.5*  MCV 86.7 88.6 86.7  MCH 29.0 28.2 28.9  MCHC 33.5 31.8 33.3  RDW 13.1 13.0 13.2  PLT 192 188 175    Cardiac EnzymesNo results for input(s): TROPONINI in the last 168 hours. No results for input(s): TROPIPOC in the last 168 hours.   BNPNo results for input(s):  BNP, PROBNP in the last 168 hours.   DDimer No results for input(s): DDIMER in the last 168 hours.   Radiology    VAS [place] LOWER EXTREMITY VENOUS (DVT)  Result Date: 12/30/2020  Lower Venous DVT Study Indications: Edema.  Comparison Study: no prior Performing Technologist: Abram Sander RVS  Examination Guidelines: A complete evaluation includes B-mode imaging, spectral Doppler, color Doppler, and power Doppler as needed of all accessible portions of each vessel. Bilateral testing is considered an integral part of a complete examination. Limited examinations for reoccurring indications may be performed as noted. The reflux portion of the exam is performed with the patient in reverse Trendelenburg.  +-----+---------------+---------+-----------+----------+--------------+ RIGHTCompressibilityPhasicitySpontaneityPropertiesThrombus Aging +-----+---------------+---------+-----------+----------+--------------+ CFV  Full           Yes      Yes                                 +-----+---------------+---------+-----------+----------+--------------+   +---------+---------------+---------+-----------+----------+--------------+ LEFT     CompressibilityPhasicitySpontaneityPropertiesThrombus Aging +---------+---------------+---------+-----------+----------+--------------+ CFV      Full           Yes      Yes                                 +---------+---------------+---------+-----------+----------+--------------+ SFJ      Full                                                        +---------+---------------+---------+-----------+----------+--------------+ FV Prox  Full                                                        +---------+---------------+---------+-----------+----------+--------------+  FV Mid   Full                                                        +---------+---------------+---------+-----------+----------+--------------+ FV DistalFull                                                         +---------+---------------+---------+-----------+----------+--------------+ PFV      Full                                                        +---------+---------------+---------+-----------+----------+--------------+ POP      Full           Yes      Yes                                 +---------+---------------+---------+-----------+----------+--------------+ PTV      Full                                                        +---------+---------------+---------+-----------+----------+--------------+ PERO     Full                                                        +---------+---------------+---------+-----------+----------+--------------+     Summary: RIGHT: - No evidence of common femoral vein obstruction.  LEFT: - There is no evidence of deep vein thrombosis in the lower extremity.  - No cystic structure found in the popliteal fossa.  *See table(s) above for measurements and observations. Electronically signed by Servando Snare MD on 12/30/2020 at 7:22:56 AM.    Final     Cardiac Studies   Transthoracic Echocardiogram: Date: 12/27/20 Results: FINDINGS   Left Ventricle: Left ventricular ejection fraction, by estimation, is 55  to 60%. The left ventricle has normal function. The left ventricle has no  regional wall motion abnormalities. The left ventricular internal cavity  size was small. There is no left  ventricular hypertrophy. Left ventricular diastolic parameters are  indeterminate.  Right Ventricle: The right ventricular size is normal. No increase in  right ventricular wall thickness. Right ventricular systolic function is  normal.  Left Atrium: Left atrial size was normal in size.  Right Atrium: Right atrial size was normal in size.  Pericardium: There is no evidence of pericardial effusion.  Mitral Valve: The mitral valve is grossly normal. Mild mitral annular  calcification. No evidence of mitral valve  regurgitation.  Tricuspid Valve: The tricuspid valve is grossly normal. Tricuspid valve  regurgitation is not demonstrated.  Aortic Valve: The aortic valve is tricuspid. There is mild aortic valve  annular  calcification. Aortic valve regurgitation is not visualized. No  aortic stenosis is present.  Pulmonic Valve: The pulmonic valve was grossly normal. Pulmonic valve  regurgitation is trivial.  Aorta: The aortic root and ascending aorta are structurally normal, with  no evidence of dilitation.  IAS/Shunts: The atrial septum is grossly normal.   NM Stress Testing : Date: 12/30/20 Results: Cancelled   Patient Profile     75 y.o. female a troponin elevation in the setting of a myriad of symptoms  Assessment & Plan    NSTEMI CKD IV PAD and HLD HTN and anxiety - continue ASA and heparin  - given CKD (creatinine improved from 12/30/20 to 12/31/20); will plan for conservative management unless high risk study- continue Cilostazol 100 mg PO BID; no evidence of LV dysfunction - continue BB and Imdur 30 mg PO daily - SHARED DECISION MAKING:  Discussed with family for great over 25 minutes discussing LHC in the setting of NSTEMI vs conservative therapy in the setting PAD vs BP control.  Son and family feel strongly that improved blood pressure control and conservative therapy would be preferred at this time unless urgent or emergent findings emerge.  We will start hydralazine 10 mg PO BID with up-titration to improve her blood pressure control - Discussed with primary MD - If BP is control and still has anginal symptoms would pursue diagnostic LHC; otherwise we will do outpatient stress (NM)   Signed, Rudean Haskell, MD [place]  12/31/2020 9:04 AM      For questions or updates, please contact [place] Please consult [email or web address] for contact info under Cardiology/STEMI.      Signed, Werner Lean, MD  12/31/2020, 9:04 AM

## 2020-12-31 NOTE — Progress Notes (Signed)
[place] for IV heparin Indication: chest pain/ACS  Allergies  Allergen Reactions  . Xyzal [Levocetirizine Dihydrochloride] Itching    Patient Measurements: Height: 5\' 4"  (162.6 cm) Weight: 57.8 kg (127 lb 6.8 oz) IBW/kg (Calculated) : 54.7 Heparin Dosing Weight: 57.8 kg  Vital Signs: Temp: 98.4 F (36.9 C) (01/18 0700) Temp Source: Oral (01/18 0700) BP: 151/53 (01/18 0700) Pulse Rate: 71 (01/18 0844)  Labs: Recent Labs    12/29/20 0103 12/30/20 0021 12/30/20 1336 12/30/20 1615 12/31/20 0025  HGB 9.4* 8.9*  --   --  8.5*  HCT 28.1* 28.0*  --   --  25.5*  PLT 192 188  --   --  175  APTT 92* 85*  --   --  77*  HEPARINUNFRC 1.04* 0.88*  --   --  0.75*  CREATININE 2.34* 2.23*  --   --  1.98*  TROPONINIHS  --   --  1,567* 1,104*  --     Estimated Creatinine Clearance: 21.2 mL/min (A) (by C-G formula based on SCr of 1.98 mg/dL (H)).   Medications:  Infusions:  . sodium chloride    . sodium chloride 75 mL/hr at 12/31/20 0400  . heparin 900 Units/hr (12/31/20 0400)    Assessment: 75 y.o. female admitted with chest pain, h/o recent DVT on apixaban. Pt started on IV heparin while ruling out ACS. Heparin level not quite correlating with aPTT which is therapeutic. CBC stable. Pt in AFib RVR yesterday afternoon.  APTT in goal range this morning at 77, heparin level still falsely elevated from recent Eliquis (HL 0.75). Hgb down slightly to 8.5, plt 175. No s/sx of bleeding or infusion issues.  Goal of Therapy:  Heparin level 0.3-0.7 units/ml aPTT 66-102 seconds Monitor platelets by anticoagulation protocol: Yes   Plan:  Continue IV heparin at 900 units/hr. Daily aPTT, heparin level and CBC.  Antonietta Jewel, PharmD, [place] Clinical Pharmacist  Phone: [phone] 12/31/2020 11:33 AM  Please check AMION for all [place] phone numbers After 10:00 PM, call [place] [phone]

## 2021-01-01 DIAGNOSIS — I214 Non-ST elevation (NSTEMI) myocardial infarction: Secondary | ICD-10-CM | POA: Diagnosis not present

## 2021-01-01 LAB — BASIC METABOLIC PANEL
Anion gap: 10 (ref 5–15)
BUN: 34 mg/dL — ABNORMAL HIGH (ref 8–23)
CO2: 19 mmol/L — ABNORMAL LOW (ref 22–32)
Calcium: 8.6 mg/dL — ABNORMAL LOW (ref 8.9–10.3)
Chloride: 112 mmol/L — ABNORMAL HIGH (ref 98–111)
Creatinine, Ser: 1.87 mg/dL — ABNORMAL HIGH (ref 0.44–1.00)
GFR, Estimated: 28 mL/min — ABNORMAL LOW (ref >60)
Glucose, Bld: 108 mg/dL — ABNORMAL HIGH (ref 70–99)
Potassium: 4.5 mmol/L (ref 3.5–5.1)
Sodium: 141 mmol/L (ref 135–145)

## 2021-01-01 LAB — CBC
HCT: 26.1 % — ABNORMAL LOW (ref 36.0–46.0)
Hemoglobin: 8.2 g/dL — ABNORMAL LOW (ref 12.0–15.0)
MCH: 28.1 pg (ref 26.0–34.0)
MCHC: 31.4 g/dL (ref 30.0–36.0)
MCV: 89.4 fL (ref 80.0–100.0)
Platelets: 161 10*3/uL (ref 150–400)
RBC: 2.92 MIL/uL — ABNORMAL LOW (ref 3.87–5.11)
RDW: 13.4 % (ref 11.5–15.5)
WBC: 7.8 10*3/uL (ref 4.0–10.5)
nRBC: 0 % (ref 0.0–0.2)

## 2021-01-01 LAB — APTT: aPTT: 68 seconds — ABNORMAL HIGH (ref 24–36)

## 2021-01-01 LAB — HEPARIN LEVEL (UNFRACTIONATED): Heparin Unfractionated: 0.45 IU/mL (ref 0.30–0.70)

## 2021-01-01 MED ORDER — HYDRALAZINE HCL 50 MG PO TABS
50.0000 mg | ORAL_TABLET | Freq: Three times a day (TID) | ORAL | Status: DC
  Administered 2021-01-01 (×3): 50 mg via ORAL
  Filled 2021-01-01 (×4): qty 1

## 2021-01-01 MED ORDER — APIXABAN 5 MG PO TABS
5.0000 mg | ORAL_TABLET | Freq: Two times a day (BID) | ORAL | Status: DC
  Administered 2021-01-01 – 2021-01-02 (×3): 5 mg via ORAL
  Filled 2021-01-01 (×3): qty 1

## 2021-01-01 NOTE — Progress Notes (Signed)
[place] for IV heparin to apixaban Indication: chest pain/ACS  Allergies  Allergen Reactions   Xyzal [Levocetirizine Dihydrochloride] Itching    Patient Measurements: Height: 5\' 4"  (162.6 cm) Weight: 57.8 kg (127 lb 6.8 oz) IBW/kg (Calculated) : 54.7 Heparin Dosing Weight: 57.8 kg  Vital Signs: Temp: 98.5 F (36.9 C) (01/19 1105) Temp Source: Oral (01/19 1105) BP: 169/75 (01/19 1105) Pulse Rate: 70 (01/19 1105)  Labs: Recent Labs    12/30/20 0021 12/30/20 1336 12/30/20 1615 12/31/20 0025 01/01/21 0040  HGB 8.9*  --   --  8.5* 8.2*  HCT 28.0*  --   --  25.5* 26.1*  PLT 188  --   --  175 161  APTT 85*  --   --  77* 68*  HEPARINUNFRC 0.88*  --   --  0.75* 0.45  CREATININE 2.23*  --   --  1.98* 1.87*  TROPONINIHS  --  1,567* 1,104*  --   --     Estimated Creatinine Clearance: 22.4 mL/min (A) (by C-G formula based on SCr of 1.87 mg/dL (H)).   Medications:  Infusions:   sodium chloride      Assessment: 75 y.o. female admitted with chest pain, h/o recent DVT on apixaban. Pt started on IV heparin while ruling out ACS. Heparin level not quite correlating with aPTT which is therapeutic. CBC stable. Pt in AFib RVR yesterday afternoon.  Pharmacy asked to resume apixaban.  Goal of Therapy:  Heparin level 0.3-0.7 units/ml aPTT 66-102 seconds Monitor platelets by anticoagulation protocol: Yes   Plan:  Stop IV heparin Resume apixaban 5mg  BID Pharmacy to sign off, reconsult if needed   Arrie Senate, PharmD, BCPS, [facility] Clinical Pharmacist [phone] Please check AMION for all [facility] numbers 01/01/2021

## 2021-01-01 NOTE — Progress Notes (Signed)
24 hour urine collection for metanephrine in progress, container placed on a pan with ice.

## 2021-01-01 NOTE — Progress Notes (Signed)
PROGRESS NOTE    [name]  EHU:[id number] DOB: 1946/02/06 DOA: 12/26/2020 PCP: Janith Lima, MD   Brief Narrative: 75 year old with past medical history significant for laryngeal cancer status post laryngectomy 2013 and status post chemo and radiation, hypertension, GERD, COPD, anxiety disorder and recent diagnosis of DVT earlier this month now on Eliquis who had an episode of intense diaphoresis, headache, neck pain and abdominal pain.  EMS was contacted who stated patient systolic blood pressures range was  between 170-240 in route.  Patient seemed to improve after Ativan given by EMS.  - On arrival troponin was found to be 684, 681.  Patient was chest pain-free.  Cardiology has been consulted and initial plan was for stress test. Patient prior to stress test got anxious, her BP increase to 240. Stress test was cancelled. Cardiology is planing Medical Management Vs Cath.      Assessment & Plan:   Non-ST elevation MI -Seen by cardiology in consultation -Was unable to tolerate a stress test, became extremely anxious, blood pressure rose to 240 -Medical management recommended -Continue aspirin, beta-blocker, Imdur  CKD stage 4 -Baseline creatinine around 1.7-1.9 -Creatinine around 2.3 here, trended down to 1.87 with hydration, fluids discontinued, clinically appears euvolemic -Needs to follow-up with nephrology  Accelerated hypertension -Off IV fluids, clinically appears euvolemic -Continue beta-blocker, Imdur, hydralazine dose being increased -She was on HCTZ, Norvasc and olmesartan at baseline -Will resume HCTZ at discharge -Dr.Regalado ordered urine metanephrines yesterday, this is pending  PAF; episode of RVR;  Had episode of chest pain 1/17 while she was in A fib RVR.  -Now in sinus rhythm, transition back to Eliquis, continue metoprolol  recent DVT: left On heparin gtt, will transition back to Eliquis  Anxiety: Continue with ativan.   Renal mass: Multiple  bilateral simple renal cyst,  Follow up out patient.   Hyperlipidemia; Continue with crestor.   Hypothyroidism Continue with synthroid,.  TSH 6.8 Her synthroid dose was recently increase to 50 mcg. Continue with current dose.   AAA, infrarenal; Needs FU US> 5 years  Laryngeal cancer -Remote, treated with surgery, followed by chemo and radiation in 2013 -[place] team following  PAD;  Continue with pletal.   Estimated body mass index is 21.87 kg/m as calculated from the following:   Height as of this encounter: 5\' 4"  (1.626 m).   Weight as of this encounter: 57.8 kg.   DVT prophylaxis: Heparin Code Status: Full code Family Communication: Son at bedside Disposition Plan:  Status is: Inpatient  Remains inpatient appropriate because:Ongoing diagnostic testing needed not appropriate for outpatient work up   Dispo: The patient is from: Home              Anticipated d/c is to: Home              Anticipated d/c date is: Likely tomorrow              Patient currently is not medically stable to d/c.  Consultants:   Cardiology   Procedures:   VQ scan  Antimicrobials:    Subjective: -Denies any chest pain, anxious about her blood pressure -Had an episode of diarrhea today Objective: Vitals:   12/31/20 2335 01/01/21 0410 01/01/21 0813 01/01/21 1105  BP:  (!) 154/61 (!) 174/54 (!) 169/75  Pulse:   76 70  Resp:  19 20 (!) 21  Temp:  98.6 F (37 C) 98.5 F (36.9 C) 98.5 F (36.9 C)  TempSrc:  Oral Oral Oral  SpO2: 96% 97% 97% 98%  Weight:      Height:        Intake/Output Summary (Last 24 hours) at 01/01/2021 1520 Last data filed at 01/01/2021 0900 Gross per 24 hour  Intake 247.57 ml  Output --  Net 247.57 ml   Filed Weights   12/27/20 0915 12/27/20 1544  Weight: 57.6 kg 57.8 kg    Examination:  General exam: Elderly female sitting up in bed, AAOx3, no distress HEENT: Tracheostomy with close stoma CVS: S1-S2, regular rate rhythm Lungs: Few scattered  rhonchi, otherwise clear Abdomen: Soft, nontender, bowel sounds present Extremities: No edema .  Skin: No rashes.    Data Reviewed: I have personally reviewed following labs and imaging studies  CBC: Recent Labs  Lab 12/26/20 2030 12/28/20 0028 12/29/20 0103 12/30/20 0021 12/31/20 0025 01/01/21 0040  WBC 9.2 6.6 7.9 7.4 6.8 7.8  NEUTROABS 7.1  --   --   --   --   --   HGB 9.3* 9.3* 9.4* 8.9* 8.5* 8.2*  HCT 29.4* 28.5* 28.1* 28.0* 25.5* 26.1*  MCV 89.6 87.4 86.7 88.6 86.7 89.4  PLT 227 211 192 188 175 322   Basic Metabolic Panel: Recent Labs  Lab 12/28/20 0028 12/29/20 0103 12/30/20 0021 12/31/20 0025 01/01/21 0040  NA 140 139 139 139 141  K 4.4 4.8 4.6 3.9 4.5  CL 108 109 108 109 112*  CO2 22 21* 20* 18* 19*  GLUCOSE 92 115* 110* 107* 108*  BUN 45* 48* 53* 41* 34*  CREATININE 1.98* 2.34* 2.23* 1.98* 1.87*  CALCIUM 9.0 9.1 8.8* 8.4* 8.6*   GFR: Estimated Creatinine Clearance: 22.4 mL/min (A) (by C-G formula based on SCr of 1.87 mg/dL (H)). Liver Function Tests: Recent Labs  Lab 12/26/20 2030  AST 21  ALT 19  ALKPHOS 53  BILITOT 0.3  PROT 6.2*  ALBUMIN 3.5   No results for input(s): LIPASE, AMYLASE in the last 168 hours. No results for input(s): AMMONIA in the last 168 hours. Coagulation Profile: Recent Labs  Lab 12/28/20 0028  INR 1.2   Cardiac Enzymes: No results for input(s): CKTOTAL, CKMB, CKMBINDEX, TROPONINI in the last 168 hours. BNP (last 3 results) No results for input(s): PROBNP in the last 8760 hours. HbA1C: No results for input(s): HGBA1C in the last 72 hours. CBG: No results for input(s): GLUCAP in the last 168 hours. Lipid Profile: No results for input(s): CHOL, HDL, LDLCALC, TRIG, CHOLHDL, LDLDIRECT in the last 72 hours. Thyroid Function Tests: No results for input(s): TSH, T4TOTAL, FREET4, T3FREE, THYROIDAB in the last 72 hours. Anemia Panel: No results for input(s): VITAMINB12, FOLATE, FERRITIN, TIBC, IRON, RETICCTPCT in the  last 72 hours. Sepsis Labs: No results for input(s): PROCALCITON, LATICACIDVEN in the last 168 hours.  Recent Results (from the past 240 hour(s))  SARS CORONAVIRUS 2 (TAT 6-24 HRS) Nasopharyngeal Nasopharyngeal Swab     Status: None   Collection Time: 12/27/20  4:28 AM   Specimen: Nasopharyngeal Swab  Result Value Ref Range Status   SARS Coronavirus 2 NEGATIVE NEGATIVE Final    Comment: (NOTE) SARS-CoV-2 target nucleic acids are NOT DETECTED.  The SARS-CoV-2 RNA is generally detectable in upper and lower respiratory specimens during the acute phase of infection. Negative results do not preclude SARS-CoV-2 infection, do not rule out co-infections with other pathogens, and should not be used as the sole basis for treatment or other patient management decisions. Negative results must be combined with clinical observations, patient history, and epidemiological  information. The expected result is Negative.  Fact Sheet for Patients: SugarRoll.be  Fact Sheet for Healthcare Providers: https://www.woods-mathews.com/  This test is not yet approved or cleared by the [place] FDA and  has been authorized for detection and/or diagnosis of SARS-CoV-2 by FDA under an Emergency Use Authorization (EUA). This EUA will remain  in effect (meaning this test can be used) for the duration of the COVID-19 declaration under Se ction 564(b)(1) of the Act, 21 U.S.C. section 360bbb-3(b)(1), unless the authorization is terminated or revoked sooner.  Performed at [facility], [place] [address] [place], [place] [address]   MRSA PCR Screening     Status: None   Collection Time: 12/27/20  3:47 PM   Specimen: Nasal Mucosa; Nasopharyngeal  Result Value Ref Range Status   MRSA by PCR NEGATIVE NEGATIVE Final    Comment:        The GeneXpert MRSA Assay (FDA approved for NASAL specimens only), is one component of a comprehensive MRSA  colonization surveillance program. It is not intended to diagnose MRSA infection nor to guide or monitor treatment for MRSA infections. Performed at [facility], [place] [address], [place],  [address]      Scheduled Meds: . apixaban  5 mg Oral BID  . aspirin EC  81 mg Oral Daily  . capsaicin   Topical BID  . cilostazol  100 mg Oral BID  . escitalopram  20 mg Oral Daily  . famotidine  20 mg Oral Daily  . fluticasone  2 spray Each Nare Daily  . folic acid  1 mg Oral Daily  . hydrALAZINE  50 mg Oral Q8H  . isosorbide mononitrate  30 mg Oral Daily  . levothyroxine  50 mcg Oral QAC breakfast  . LORazepam  1 mg Oral Daily  . metoprolol tartrate  50 mg Oral BID  . regadenoson  0.4 mg Intravenous Once  . rosuvastatin  20 mg Oral Daily  . sodium chloride flush  3 mL Intravenous Q12H  . thiamine  100 mg Oral Daily   Continuous Infusions: . sodium chloride       LOS: 5 days    Time spent: 25 minutes.   Domenic Polite, MD [facility]  01/01/2021, 3:20 PM

## 2021-01-01 NOTE — Progress Notes (Signed)
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  Negative V/Q scan Stress test called off for HTN.  Troponin peak 1,567.  Discussed LCP with family 12/31/20 but at that time the risk of AKI concerned family:  Planned for medical management with outpatient NM with BP control.  Patient notes that she is doing no further chest pain.  Since day prior notes diarrhea.  Improved blood pressures from prior but still some reading above 170 that occur with anxiety.    No chest pain or pressure.  No SOB/DOE and no PND/Orthopnea.  No weight gain or leg swelling.  No palpitations or syncope.  Inpatient Medications    Scheduled Meds: . aspirin EC  81 mg Oral Daily  . capsaicin   Topical BID  . cilostazol  100 mg Oral BID  . escitalopram  20 mg Oral Daily  . famotidine  20 mg Oral Daily  . fluticasone  2 spray Each Nare Daily  . folic acid  1 mg Oral Daily  . hydrALAZINE  50 mg Oral Q8H  . isosorbide mononitrate  30 mg Oral Daily  . levothyroxine  50 mcg Oral QAC breakfast  . LORazepam  1 mg Oral Daily  . metoprolol tartrate  50 mg Oral BID  . regadenoson  0.4 mg Intravenous Once  . rosuvastatin  20 mg Oral Daily  . sodium chloride flush  3 mL Intravenous Q12H  . thiamine  100 mg Oral Daily   Continuous Infusions: . sodium chloride    . sodium chloride Stopped (12/31/20 1658)  . heparin 900 Units/hr (01/01/21 0400)   PRN Meds: sodium chloride, acetaminophen **OR** acetaminophen, calcium carbonate (dosed in mg elemental calcium), camphor-menthol **AND** hydrOXYzine, diphenoxylate-atropine, docusate sodium,  feeding supplement (NEPRO CARB STEADY), hydrALAZINE, HYDROcodone-acetaminophen, morphine injection, nitroGLYCERIN, ondansetron **OR** ondansetron (ZOFRAN) IV, sodium chloride flush, sorbitol, zolpidem   Vital Signs    Vitals:   12/31/20 2129 12/31/20 2326 12/31/20 2335 01/01/21 0410  BP: (!) 148/47 (!) 144/72  (!) 154/61  Pulse: 80 73    Resp:  18  19  Temp:  98.4 F (36.9 C)  98.6 F (37 C)  TempSrc:  Oral  Oral  SpO2:  97% 96% 97%  Weight:      Height:        Intake/Output Summary (Last 24 hours) at 01/01/2021 0740 Last data filed at 01/01/2021 0400 Gross per 24 hour  Intake 1353.32 ml  Output -  Net 1353.32 ml   Filed Weights   12/27/20 0915 12/27/20 1544  Weight: 57.6 kg 57.8 kg    Telemetry    SR - Personally Reviewed  ECG    No new - Personally Reviewed  Physical Exam   GEN: No acute distress.   Neck: No JVD, Trach site c/di Cardiac: RRR, no murmurs, rubs, or gallops.  Respiratory: Clear to auscultation bilaterally. GI: Soft, nontender, non-distended  MS: No edema; No deformity. Neuro:  Nonfocal  Psych: Normal affect   Labs    Chemistry Recent Labs  Lab 12/26/20 2030 12/28/20 0028 12/29/20 0103 12/30/20 0021 12/31/20 0025  NA 139   < >  139 139 139  K 4.9   < > 4.8 4.6 3.9  CL 109   < > 109 108 109  CO2 18*   < > 21* 20* 18*  GLUCOSE 124*   < > 115* 110* 107*  BUN 50*   < > 48* 53* 41*  CREATININE 2.10*   < > 2.34* 2.23* 1.98*  CALCIUM 8.9   < > 9.1 8.8* 8.4*  PROT 6.2*  --   --   --   --   ALBUMIN 3.5  --   --   --   --   AST 21  --   --   --   --   ALT 19  --   --   --   --   ALKPHOS 53  --   --   --   --   BILITOT 0.3  --   --   --   --   GFRNONAA 24*   < > 21* 22* 26*  ANIONGAP 12   < > _0 < > = values in this interval not displayed.     Hematology Recent Labs  Lab 12/30/20 0021 12/31/20 0025 01/01/21 0040  WBC 7.4 6.8 7.8  RBC 3.16* 2.94* 2.92*  HGB 8.9* 8.5* 8.2*  HCT 28.0* 25.5* 26.1*  MCV 88.6 86.7 89.4  MCH  28.2 28.9 28.1  MCHC 31.8 33.3 31.4  RDW 13.0 13.2 13.4  PLT 188 175 161    Cardiac EnzymesNo results for input(s): TROPONINI in the last 168 hours. No results for input(s): TROPIPOC in the last 168 hours.   BNPNo results for input(s): BNP, PROBNP in the last 168 hours.   DDimer No results for input(s): DDIMER in the last 168 hours.   Radiology    No results found.  Cardiac Studies   Transthoracic Echocardiogram: Date: 12/27/20 Results: FINDINGS   Left Ventricle: Left ventricular ejection fraction, by estimation, is 55  to 60%. The left ventricle has normal function. The left ventricle has no  regional wall motion abnormalities. The left ventricular internal cavity  size was small. There is no left  ventricular hypertrophy. Left ventricular diastolic parameters are  indeterminate.  Right Ventricle: The right ventricular size is normal. No increase in  right ventricular wall thickness. Right ventricular systolic function is  normal.  Left Atrium: Left atrial size was normal in size.  Right Atrium: Right atrial size was normal in size.  Pericardium: There is no evidence of pericardial effusion.  Mitral Valve: The mitral valve is grossly normal. Mild mitral annular  calcification. No evidence of mitral valve regurgitation.  Tricuspid Valve: The tricuspid valve is grossly normal. Tricuspid valve  regurgitation is not demonstrated.  Aortic Valve: The aortic valve is tricuspid. There is mild aortic valve  annular calcification. Aortic valve regurgitation is not visualized. No  aortic stenosis is present.  Pulmonic Valve: The pulmonic valve was grossly normal. Pulmonic valve  regurgitation is trivial.  Aorta: The aortic root and ascending aorta are structurally normal, with  no evidence of dilitation.  IAS/Shunts: The atrial septum is grossly normal.   NM Stress Testing : Date: 12/30/20 Results: Cancelled   Patient Profile     75 y.o. female a troponin elevation in the  setting of a myriad of symptoms  Assessment & Plan    NSTEMI CKD IV PAD and HLD HTN and anxiety - continue ASA and transition to eliquis - given CKD (creatinine improved from 12/30/20 to  12/31/20); will plan for conservative management unless high risk study- continue Cilostazol 100 mg PO BID; no evidence of LV dysfunction - continue BB and Imdur 30 mg PO daily - Increased hydralazine to 50 mg BID - Discussed with son, patient and family:  Discussed options of medical therapy, prognostic NM Stress test (keeping BB and anti-anginal medications), vs diagnostic LHC.  Family would opt for medical therapy as patient does not have interested in [facility] with risk of kidney function. Discussed with patient, primary MD, nursing, and family Potential 01/02/21 sign off     For questions or updates, please contact [facility] Please consult [email or web address] for contact info under Cardiology/STEMI.      Signed, Werner Lean, MD  01/01/2021, 7:40 AM

## 2021-01-01 NOTE — Progress Notes (Signed)
Spent some time with Ms. Cerro this afternoon, two of her three sons were present.  She has a supply of faceplates and HMEs at bedside. She changes her HME daily and her faceplate as needed.  I helped her with some pulmonary toilet. She has very thick, chunky mucus, but able to cough it out of her stoma with some effort. I spoke with her and her sons about drinking enough water to keep her secretions thin.  I helped her clean around her stoma - bits of mucus and dried blood.  She cleaned her TEP voice prosthesis - when it is clogged with mucus she is unable to use it to speak.  She has been caring for her TL stoma since 2013, so she did not require any education.

## 2021-01-02 LAB — CBC
HCT: 25.6 % — ABNORMAL LOW (ref 36.0–46.0)
Hemoglobin: 8 g/dL — ABNORMAL LOW (ref 12.0–15.0)
MCH: 27.8 pg (ref 26.0–34.0)
MCHC: 31.3 g/dL (ref 30.0–36.0)
MCV: 88.9 fL (ref 80.0–100.0)
Platelets: 158 10*3/uL (ref 150–400)
RBC: 2.88 MIL/uL — ABNORMAL LOW (ref 3.87–5.11)
RDW: 13.4 % (ref 11.5–15.5)
WBC: 7 10*3/uL (ref 4.0–10.5)
nRBC: 0 % (ref 0.0–0.2)

## 2021-01-02 LAB — BASIC METABOLIC PANEL
Anion gap: 10 (ref 5–15)
BUN: 30 mg/dL — ABNORMAL HIGH (ref 8–23)
CO2: 19 mmol/L — ABNORMAL LOW (ref 22–32)
Calcium: 8.6 mg/dL — ABNORMAL LOW (ref 8.9–10.3)
Chloride: 109 mmol/L (ref 98–111)
Creatinine, Ser: 1.92 mg/dL — ABNORMAL HIGH (ref 0.44–1.00)
GFR, Estimated: 27 mL/min — ABNORMAL LOW (ref >60)
Glucose, Bld: 101 mg/dL — ABNORMAL HIGH (ref 70–99)
Potassium: 4.1 mmol/L (ref 3.5–5.1)
Sodium: 138 mmol/L (ref 135–145)

## 2021-01-02 MED ORDER — APIXABAN (ELIQUIS) VTE STARTER PACK (10MG AND 5MG): 5.0000 mg | ORAL_TABLET | Freq: Two times a day (BID) | ORAL | Status: DC

## 2021-01-02 MED ORDER — ISOSORBIDE MONONITRATE ER 30 MG PO TB24: 30.0000 mg | ORAL_TABLET | Freq: Every day | ORAL | 0 refills | Status: DC

## 2021-01-02 MED ORDER — METOPROLOL TARTRATE 50 MG PO TABS: 50.0000 mg | ORAL_TABLET | Freq: Two times a day (BID) | ORAL | 0 refills | Status: DC

## 2021-01-02 MED ORDER — HYDRALAZINE HCL 50 MG PO TABS: 75.0000 mg | ORAL_TABLET | Freq: Three times a day (TID) | ORAL | 1 refills | Status: DC

## 2021-01-02 MED ORDER — HYDRALAZINE HCL 50 MG PO TABS
75.0000 mg | ORAL_TABLET | Freq: Three times a day (TID) | ORAL | Status: DC
  Administered 2021-01-02: 75 mg via ORAL
  Filled 2021-01-02: qty 1

## 2021-01-02 MED ORDER — HYDROCHLOROTHIAZIDE 25 MG PO TABS: 25.0000 mg | ORAL_TABLET | Freq: Every day | ORAL | Status: DC

## 2021-01-02 NOTE — Discharge Summary (Addendum)
Physician Discharge Summary  [name] IRC:[id number] DOB: Sep 16, 1946 DOA: 12/26/2020  PCP: Ann Lima, MD  Admit date: 12/26/2020 Discharge date: 01/02/2021  Time spent: 67minutes  Recommendations for Outpatient Follow-up:  1. PCP in 1 week, titrate blood pressure meds, consider adding diuretics if needed 2. Nephrology follow-up in 2 to 4 weeks, referral sent to [facility] 3. 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 -History of recent DVT Chronic anemia   Hypertension   GAD (generalized anxiety disorder)   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   COPD (chronic obstructive pulmonary disease) (HCC)   Hyperlipidemia LDL goal <130   CKD stage 4 secondary to hypertension ([place])   PAD (peripheral artery disease) ([place])   Other specified hypothyroidism   Discharge Condition: Stable  Diet recommendation: Low-sodium, heart healthy  Filed Weights   12/27/20 0915 12/27/20 1544  Weight: 57.6 kg 57.8 kg    History of present illness:  75 year old with past medical history significant for laryngeal cancer status post laryngectomy 2013 and status post chemo and radiation, hypertension, GERD, COPD, anxiety disorder and recent diagnosis of DVT earlier this month now on Eliquis who had an episode of intense diaphoresis, headache, neck pain and abdominal pain.  EMS was contacted who stated patient systolic blood pressures range was  between 170-240 in route.  Patient seemed to improve after Ativan given by EMS.  - On arrival troponin was found to be 684, [place] [facility] Course:   Non-ST elevation MI -High-sensitivity troponin peaked to 1567, seen by cardiology in consultation -Was unable to tolerate a stress test, became extremely anxious, blood pressure  rose to 240 -Left heart cath was considered at one point however due to his advanced chronic kidney disease after much discussion with patient and family this was deferred and medical management was recommended -Continue beta-blocker, Imdur, statin she is already on Eliquis, clinically stable now -Cardiology will arrange follow-up  CKD stage 4 -Baseline creatinine around 1.7-1.9 -Creatinine around 2.3 here, trended down to 1.9 -Clinically appears euvolemic, has never seen a nephrologist, referral made with [facility] -BMP checked in 1 week  Accelerated hypertension -she is on atenolol, HCTZ and olmesartan at baseline -Followed closely by cardiology, started on metoprolol, Imdur and hydralazine, hydralazine dose increased today to 75 mg 3 times daily -educated regarding importance of strict low-salt diet especially in the setting of CKD4, She is clinically euvolemic, so we did not add a diuretic to her medication regimen yet however suspect she will need this in the future -Close follow-up with PCP and referral made to nephrology  -Dr.Regalado ordered urine metanephrines on 1/18, this is pending at discharge  PAF; episode of RVR;  Had episode of chest pain 1/17 while she was in A fib RVR.  -Now in sinus rhythm, transitioned back to Eliquis, continue metoprolol  recent DVT: left -Continue Eliquis  Anxiety: -On daily dose of at baseline, this was continued  Renal mass: Multiple bilateral simple renal cyst,  Follow up outpatient.   Hyperlipidemia; Continue with crestor.   Hypothyroidism Continue with synthroid,.  TSH 6.8 Her synthroid dose was recently increase to 50 mcg. Continue with current dose.   AAA, infrarenal; Needs FU US> 5 years  Laryngeal cancer -Remote, treated with surgery, followed by chemo and radiation in 2013 -She has capped off  her trach tube and is able to manage this without issues  PAD;  Continue with pletal.    Discharge  Exam: Vitals:   01/02/21 0739 01/02/21 0827  BP: (!) 172/86   Pulse: 82 75  Resp: (!) 21 (!) 22  Temp: 98.7 F (37.1 C)   SpO2: 98% 98%    General: AAOx3 HEENT: Tracheostomy with stoma Cardiovascular: S1S2/RRR Respiratory: Clear Abdomen: Soft, nontender, bowel sounds present Extremities: No edema  Discharge Instructions   Discharge Instructions    Ambulatory referral to Nephrology   Complete by: As directed    [place] in 1 month   Diet - low sodium heart healthy   Complete by: As directed    Increase activity slowly   Complete by: As directed      Allergies as of 01/02/2021      Reactions   Xyzal [levocetirizine Dihydrochloride] Itching      Medication List    STOP taking these medications   atenolol 25 MG tablet Commonly known as: TENORMIN   Olmesartan-amLODIPine-HCTZ 20-5-12.5 MG Tabs     TAKE these medications   Apixaban Starter Pack (10mg  and 5mg ) Commonly known as: ELIQUIS STARTER PACK Take 5 mg by mouth 2 (two) times daily. What changed:   how much to take  how to take this  when to take this  additional instructions Notes to patient: 01/02/21 9 PM   cilostazol 100 MG tablet Commonly known as: PLETAL Take 1 tablet (100 mg total) by mouth 2 (two) times daily. Notes to patient: 01/02/21 9PM   diphenoxylate-atropine 2.5-0.025 MG tablet Commonly known as: LOMOTIL Take 1 tablet by mouth 4 (four) times daily as needed for diarrhea or loose stools.   Ensure Take 237 mLs by mouth 2 (two) times daily. Notes to patient: 01/02/21 Evening   escitalopram 20 MG tablet Commonly known as: LEXAPRO Take 1 tablet (20 mg total) by mouth daily. Notes to patient: 01/03/21 Morning   famotidine 40 MG tablet Commonly known as: PEPCID Take 1 tablet (40 mg total) by mouth daily. Notes to patient: 01/03/21 Morning   fluticasone 50 MCG/ACT nasal spray Commonly known as: FLONASE SPRAY 2 SPRAYS INTO EACH NOSTRIL EVERY DAY What changed:   how  much to take  how to take this  when to take this  additional instructions Notes to patient: 3/78/58 Morning   folic acid 1 MG tablet Commonly known as: FOLVITE Take 1 tablet (1 mg total) by mouth daily. Notes to patient: 01/03/21 Morning   hydrALAZINE 50 MG tablet Commonly known as: APRESOLINE Take 1.5 tablets (75 mg total) by mouth 3 (three) times daily. Notes to patient: 01/02/21 4 PM   isosorbide mononitrate 30 MG 24 hr tablet Commonly known as: IMDUR Take 1 tablet (30 mg total) by mouth daily. Start taking on: January 03, 2021 Notes to patient: 01/03/21 Morning   levothyroxine 50 MCG tablet Commonly known as: Synthroid Take 1 tablet (50 mcg total) by mouth daily before breakfast. Notes to patient: 01/03/21 before Breakfast   LORazepam 1 MG tablet Commonly known as: ATIVAN Take 1 tablet (1 mg total) by mouth daily. Notes to patient: 01/03/21 Morning   metoprolol tartrate 50 MG tablet Commonly known as: LOPRESSOR Take 1 tablet (50 mg total) by mouth 2 (two) times daily. Notes to patient: 01/02/21 9 PM   rosuvastatin 20 MG tablet Commonly known as: CRESTOR Take 1 tablet (20 mg total) by mouth daily. Notes to patient: 01/03/21 Morning   thiamine 100 MG  tablet Commonly known as: Vitamin B-1 Take 1 tablet (100 mg total) by mouth daily. Notes to patient: 01/03/21 Morning   UNABLE TO FIND Take 1-2 drops by mouth as needed (leg pain). CBD Oil      Allergies  Allergen Reactions  . Xyzal [Levocetirizine Dihydrochloride] Itching    Follow-up Information    Nahser, Wonda Cheng, MD Follow up on 01/16/2021.   Specialty: Cardiology Why: with Kathyrn Drown, NP at 3:15 pm  Contact information: [address] [place] [address] [phone]        [facility]. Go in 1 month(s).   Why: Referral sent       Ann Lima, MD. Schedule an appointment as soon as possible for a visit in 1 week(s).   Specialty: Internal Medicine Contact  information: [place] [address] [phone]                The results of significant diagnostics from this hospitalization (including imaging, microbiology, ancillary and laboratory) are listed below for reference.    Significant Diagnostic Studies: DG Chest 2 View  Result Date: 12/27/2020 CLINICAL DATA:  Cough, fever EXAM: CHEST - 2 VIEW COMPARISON:  10/30/2015 FINDINGS: The heart size and mediastinal contours are within normal limits. Atherosclerotic calcification of the aortic knob. No focal airspace consolidation, pleural effusion, or pneumothorax. The visualized skeletal structures are unremarkable. IMPRESSION: No active cardiopulmonary disease. Electronically Signed   By: Davina Poke D.O.   On: 12/27/2020 11:51   DG Lumbar Spine Complete  Result Date: 12/20/2020 CLINICAL DATA:  Left low back pain with sciatica. EXAM: LUMBAR SPINE - COMPLETE 4+ VIEW COMPARISON:  Abdomen and pelvis CT dated 05/02/2003. FINDINGS: Five non-rib-bearing lumbar vertebrae. Straightening of the normal lumbar lordosis. Minimal degenerative changes at the L2-3 level with mild-to-moderate disc space narrowing and minimal anterior spur formation with discogenic sclerosis. Mild facet degenerative changes at the L5-S1 level with associated grade 1 anterolisthesis. No pars defects or fractures seen. Normal bowel gas pattern. Atheromatous arterial calcifications. These make it difficult to detect urinary tract calculi. Possible tiny bilateral renal calculi. IMPRESSION: 1. Mild degenerative changes, as described above. 2. Possible tiny bilateral renal calculi. 3. No acute abnormality. Electronically Signed   By: Claudie Revering M.D.   On: 12/20/2020 15:23   US RENAL  Result Date: 12/27/2020 CLINICAL DATA:  Renal mass EXAM: RENAL / URINARY TRACT ULTRASOUND COMPLETE COMPARISON:  Same day CT abdomen pelvis, 12:34 a.m. FINDINGS: Right Kidney: Renal measurements: 9.4 x 5.4 x 4.8 cm = volume: 129 mL.  Echogenicity within normal limits. Multiple simple appearing cysts measuring up to 2.5 cm. No solid mass or hydronephrosis visualized. Left Kidney: Renal measurements: 9.2 x 4.8 x 5.0 cm = volume: 114 mL mL. Echogenicity within normal limits. Multiple simple appearing cysts measuring up to 2.1 cm. No solid mass or hydronephrosis visualized. Bladder: Appears normal for degree of bladder distention. Other: None. IMPRESSION: Multiple bilateral simple appearing renal cysts. No evidence of solid mass, with particular attention to the superior pole of the right kidney. There is a simple cyst in this location which appears to correspond to finding of prior CT. Electronically Signed   By: Eddie Candle M.D.   On: 12/27/2020 11:08   NM Pulmonary Perf and Vent  Result Date: 12/27/2020 CLINICAL DATA:  Squamous cell carcinoma with chest pain EXAM: NUCLEAR MEDICINE PERFUSION LUNG SCAN TECHNIQUE: Perfusion images were obtained in multiple projections after intravenous injection of radiopharmaceutical. Views:  Anterior, posterior, left lateral, right lateral, RPO, LPO, RAO, LAO. RADIOPHARMACEUTICALS:  4.25 mCi Tc-32m MAA IV COMPARISON:  Chest radiograph December 27, 2020 FINDINGS: Radiotracer uptake is homogeneous and symmetric bilaterally. No perfusion defects evident. IMPRESSION: No perfusion defects evident. No findings indicative of pulmonary embolus. Electronically Signed   By: Lowella Grip III M.D.   On: 12/27/2020 13:55   ECHOCARDIOGRAM COMPLETE  Result Date: 12/27/2020    ECHOCARDIOGRAM REPORT   Patient Name:   [name] Date of Exam: 12/27/2020 Medical Rec #:  [id number]        Height:       64.0 in Accession #:    [id number]       Weight:       127.0 lb Date of Birth:  03-Oct-1946       BSA:          1.613 m Patient Age:    40 years         BP:           135/66 mmHg Patient Gender: F                HR:           69 bpm. Exam Location:  Inpatient Procedure: 2D Echo, Cardiac Doppler and Color Doppler  Indications:    Elevated Troponin.  History:        Patient has no prior history of Echocardiogram examinations.                 Stroke, COPD and PAD, Signs/Symptoms:Dyspnea; Risk                 Factors:Hypertension and Dyslipidemia. CKD. Cancer. GERD.  Sonographer:    Jonelle Sidle Dance Referring Phys: 53 [place]  1. Left ventricular ejection fraction, by estimation, is 55 to 60%. The left ventricle has normal function. The left ventricle has no regional wall motion abnormalities. Left ventricular diastolic parameters are indeterminate.  2. Right ventricular systolic function is normal. The right ventricular size is normal.  3. The mitral valve is grossly normal. No evidence of mitral valve regurgitation.  4. The aortic valve is tricuspid. Aortic valve regurgitation is not visualized. No aortic stenosis is present. Comparison(s): No prior Echocardiogram. Conclusion(s)/Recommendation(s): Normal biventricular function without evidence of hemodynamically significant valvular heart disease. FINDINGS  Left Ventricle: Left ventricular ejection fraction, by estimation, is 55 to 60%. The left ventricle has normal function. The left ventricle has no regional wall motion abnormalities. The left ventricular internal cavity size was small. There is no left ventricular hypertrophy. Left ventricular diastolic parameters are indeterminate. Right Ventricle: The right ventricular size is normal. No increase in right ventricular wall thickness. Right ventricular systolic function is normal. Left Atrium: Left atrial size was normal in size. Right Atrium: Right atrial size was normal in size. Pericardium: There is no evidence of pericardial effusion. Mitral Valve: The mitral valve is grossly normal. Mild mitral annular calcification. No evidence of mitral valve regurgitation. Tricuspid Valve: The tricuspid valve is grossly normal. Tricuspid valve regurgitation is not demonstrated. Aortic Valve: The aortic valve is  tricuspid. There is mild aortic valve annular calcification. Aortic valve regurgitation is not visualized. No aortic stenosis is present. Pulmonic Valve: The pulmonic valve was grossly normal. Pulmonic valve regurgitation is trivial. Aorta: The aortic root and ascending aorta are structurally normal, with no evidence of dilitation. IAS/Shunts: The atrial septum is grossly normal.  LEFT VENTRICLE PLAX 2D LVIDd:  3.70 cm  Diastology LVIDs:         3.50 cm  LV e' medial:    4.13 cm/s LV PW:         1.30 cm  LV E/e' medial:  14.3 LV IVS:        0.90 cm  LV e' lateral:   3.70 cm/s LVOT diam:     1.70 cm  LV E/e' lateral: 15.9 LV SV:         41 LV SV Index:   25 LVOT Area:     2.27 cm  RIGHT VENTRICLE             IVC RV Basal diam:  2.20 cm     IVC diam: 1.50 cm RV S prime:     12.10 cm/s TAPSE (M-mode): 1.4 cm LEFT ATRIUM             Index       RIGHT ATRIUM          Index LA diam:        3.70 cm 2.29 cm/m  RA Area:     9.07 cm LA Vol (A2C):   66.6 ml 41.29 ml/m RA Volume:   18.00 ml 11.16 ml/m LA Vol (A4C):   32.5 ml 20.15 ml/m LA Biplane Vol: 47.9 ml 29.70 ml/m  AORTIC VALVE LVOT Vmax:   88.60 cm/s LVOT Vmean:  51.100 cm/s LVOT VTI:    0.181 m  AORTA Ao Root diam: 3.00 cm Ao Asc diam:  3.00 cm MITRAL VALVE MV Area (PHT): 2.17 cm    SHUNTS MV Decel Time: 349 msec    Systemic VTI:  0.18 m MV E velocity: 58.90 cm/s  Systemic Diam: 1.70 cm MV A velocity: 90.00 cm/s MV E/A ratio:  0.65 Rudean Haskell MD Electronically signed by Rudean Haskell MD Signature Date/Time: 12/27/2020/4:40:12 PM    Final    CT Renal Stone Study  Result Date: 12/27/2020 CLINICAL DATA:  Flank pain, stone disease suspected EXAM: CT ABDOMEN AND PELVIS WITHOUT CONTRAST TECHNIQUE: Multidetector CT imaging of the abdomen and pelvis was performed following the standard protocol without IV contrast. COMPARISON:  CT 05/03/2003 FINDINGS: Lower chest: Lung bases are clear. Normal heart size. No pericardial effusion. Few  calcifications on the mitral annulus. Coronary artery calcifications and distal thoracic aortic calcifications are present as well. Hypoattenuation of the cardiac blood pool, suggestive of anemia. Hepatobiliary: No visible focal liver lesion within limitations of this unenhanced CT. Normal liver attenuation. Smooth liver surface contour. Normal gallbladder and biliary tree without visible calcified gallstone. Pancreas: No pancreatic ductal dilatation or surrounding inflammatory changes. Spleen: Normal in size. No concerning splenic lesions. Punctate calcification in the spleen may be vascular or related to prior granulomatous disease (3/22) normal splenic size. No worrisome splenic lesions. Adrenals/Urinary Tract: Normal adrenal glands. Since 2004, there has been development of numerous cysts throughout the kidneys, while many are fluid attenuation, there are several more hyperdense cysts including a 10 mm partially exophytic cyst in the interpolar right kidney (3/35 and a cyst with some layering hyperattenuation in the upper pole left kidney measuring 1.8 cm (3/27). Additionally, there is a more conspicuous almost solid-appearing lesion arising from the upper pole right kidney which measures approximately 2.5 x 2.2 x 2.8 cm, incompletely characterized on this unenhanced exam. Few punctate nonobstructing calculi are present in both kidneys. No obstructive urolithiasis is seen though there is slight asymmetric prominence of the left renal pelvis and proximal ureter without visible  obstructing calculus or lesion. Multiple vascular calcifications are noted as well. Stomach/Bowel: Sliding-type hiatal hernia. Distal stomach and duodenum are unremarkable. No small bowel thickening or dilatation. No colonic dilatation or wall thickening. High attenuation enteric contrast media traverses part way through the transverse colon. No evidence of bowel obstruction. The appendix is not visualized. Correlate for history of  appendectomy. No focal inflammation the vicinity of the cecum to suggest an occult appendicitis. Vascular/Lymphatic: Atherosclerotic calcifications within the abdominal aorta and branch vessels. Focal fusiform infrarenal abdominal aortic dilatation to 2.6 cm in diameter (6/47) arising approximately 1 cm below the renal artery ostia. No other aneurysm or ectasia. 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 No other major venous abnormalities. No enlarged abdominopelvic lymph nodes. Reproductive: Normal appearance of the uterus and adnexal structures. Other: No abdominopelvic free fluid or free gas. No bowel containing hernias. Musculoskeletal: Multilevel degenerative changes are present in the imaged portions of the spine. No acute osseous abnormality or suspicious osseous lesion. IMPRESSION: 1. Slight asymmetric prominence in stranding of the left renal pelvis and proximal ureter without visible obstructing calculus or lesion. Findings could reflect recently passed stone or infection. Correlate with patient's symptoms and urinalysis. No obstructive urolithiasis or frank hydronephrosis at this time. 2. Interval development of numerous cysts throughout the kidneys, while many are fluid attenuation, there are several more hyperdense which could reflect hemorrhagic or proteinaceous cyst. A more conspicuous solid-appearing lesion arising from the upper pole right kidney which measures approximately 2.5 x 2.2 x 2.8 cm, incompletely characterized on this unenhanced exam. Recommend further evaluation with nonemergent renal ultrasound. 3. Sliding-type hiatal hernia. 4. Hypoattenuation of the cardiac blood pool, suggestive of anemia. 5. Focal infrarenal abdominal aortic dilatation to 2.6 cm. Recommend follow-up ultrasound every 5 years. This recommendation follows ACR consensus guidelines: White Paper of the ACR Incidental Findings Committee II on Vascular Findings. J Am Coll Radiol 2013;  10:789-794. 6. Aortic Atherosclerosis (ICD10-I70.0). These results were called by telephone at the time of interpretation on 12/27/2020 at 12:53 am to provider [facility] , who verbally acknowledged these results. Electronically Signed   By: Lovena Le M.D.   On: 12/27/2020 00:53   VAS [place] LOWER EXTREMITY VENOUS (DVT)  Result Date: 12/30/2020  Lower Venous DVT Study Indications: Edema.  Comparison Study: no prior Performing Technologist: Abram Sander RVS  Examination Guidelines: A complete evaluation includes B-mode imaging, spectral Doppler, color Doppler, and power Doppler as needed of all accessible portions of each vessel. Bilateral testing is considered an integral part of a complete examination. Limited examinations for reoccurring indications may be performed as noted. The reflux portion of the exam is performed with the patient in reverse Trendelenburg.  +-----+---------------+---------+-----------+----------+--------------+ RIGHTCompressibilityPhasicitySpontaneityPropertiesThrombus Aging +-----+---------------+---------+-----------+----------+--------------+ CFV  Full           Yes      Yes                                 +-----+---------------+---------+-----------+----------+--------------+   +---------+---------------+---------+-----------+----------+--------------+ LEFT     CompressibilityPhasicitySpontaneityPropertiesThrombus Aging +---------+---------------+---------+-----------+----------+--------------+ CFV      Full           Yes      Yes                                 +---------+---------------+---------+-----------+----------+--------------+ SFJ      Full                                                        +---------+---------------+---------+-----------+----------+--------------+  FV Prox  Full                                                        +---------+---------------+---------+-----------+----------+--------------+ FV Mid   Full                                                         +---------+---------------+---------+-----------+----------+--------------+ FV DistalFull                                                        +---------+---------------+---------+-----------+----------+--------------+ PFV      Full                                                        +---------+---------------+---------+-----------+----------+--------------+ POP      Full           Yes      Yes                                 +---------+---------------+---------+-----------+----------+--------------+ PTV      Full                                                        +---------+---------------+---------+-----------+----------+--------------+ PERO     Full                                                        +---------+---------------+---------+-----------+----------+--------------+     Summary: RIGHT: - No evidence of common femoral vein obstruction.  LEFT: - There is no evidence of deep vein thrombosis in the lower extremity.  - No cystic structure found in the popliteal fossa.  *See table(s) above for measurements and observations. Electronically signed by Servando Snare MD on 12/30/2020 at 7:22:56 AM.    Final    VAS [place] LOWER EXTREMITY VENOUS (DVT)  Result Date: 12/20/2020  Lower Venous DVT Study Indications: Patient reports right calf pain x 2 weeks. She denies any SOB.  Risk Factors: None identified. Comparison Study: NA Performing Technologist: Sharlett Iles RVT  Examination Guidelines: A complete evaluation includes B-mode imaging, spectral Doppler, color Doppler, and power Doppler as needed of all accessible portions of each vessel. Bilateral testing is considered an integral part of a complete examination. Limited examinations for reoccurring indications may be performed as noted. The reflux portion of the exam is performed with the patient in reverse Trendelenburg.   +---------+---------------+---------+-----------+---------------+--------------+ RIGHT    CompressibilityPhasicitySpontaneityProperties  Thrombus Aging +---------+---------------+---------+-----------+---------------+--------------+ CFV      Full           Yes      Yes                                      +---------+---------------+---------+-----------+---------------+--------------+ SFJ      Full           Yes      Yes                                      +---------+---------------+---------+-----------+---------------+--------------+ FV Prox  Full           Yes      Yes                                      +---------+---------------+---------+-----------+---------------+--------------+ FV Mid   Full                                                             +---------+---------------+---------+-----------+---------------+--------------+ FV DistalFull           Yes      Yes                                      +---------+---------------+---------+-----------+---------------+--------------+ PFV      Full           Yes      Yes                                      +---------+---------------+---------+-----------+---------------+--------------+ POP      Full           Yes      Yes                                      +---------+---------------+---------+-----------+---------------+--------------+ PTV      Full           Yes      Yes                                      +---------+---------------+---------+-----------+---------------+--------------+ PERO     Full           Yes      Yes                                      +---------+---------------+---------+-----------+---------------+--------------+ Soleal   Partial        Yes      Yes        softly         Acute  echogenic and                                                             dilated at .64                                                             cm                            +---------+---------------+---------+-----------+---------------+--------------+ Gastroc  Full                                                             +---------+---------------+---------+-----------+---------------+--------------+   +---------+---------------+---------+-----------+----------+--------------+ LEFT     CompressibilityPhasicitySpontaneityPropertiesThrombus Aging +---------+---------------+---------+-----------+----------+--------------+ CFV      Full           Yes      Yes                                 +---------+---------------+---------+-----------+----------+--------------+ SFJ      Full           Yes      Yes                                 +---------+---------------+---------+-----------+----------+--------------+ FV Prox  Full           Yes      Yes                                 +---------+---------------+---------+-----------+----------+--------------+ FV Mid   Full           Yes      Yes                                 +---------+---------------+---------+-----------+----------+--------------+ FV DistalFull           Yes      Yes                                 +---------+---------------+---------+-----------+----------+--------------+ PFV      Full           Yes      Yes                                 +---------+---------------+---------+-----------+----------+--------------+ POP      Full           Yes      Yes                                 +---------+---------------+---------+-----------+----------+--------------+  PTV      Full           Yes      Yes                                 +---------+---------------+---------+-----------+----------+--------------+ PERO     Full           Yes      Yes                                 +---------+---------------+---------+-----------+----------+--------------+ Soleal   Full           Yes       Yes                                 +---------+---------------+---------+-----------+----------+--------------+ Gastroc  NV             Yes      Yes                                 +---------+---------------+---------+-----------+----------+--------------+ GSV      Full           Yes      Yes                                 +---------+---------------+---------+-----------+----------+--------------+ SSV      Full           Yes      Yes                                 +---------+---------------+---------+-----------+----------+--------------+    Findings reported to Mickel Baas at 2:45 pm.  Summary: RIGHT: - Findings consistent with acute deep vein thrombosis involving the right soleal veins. - Dilated soleal vein, measuring .64 cm. - No cystic structure found in the popliteal fossa. - Great saphenous vein not visualized.  LEFT: - No cystic structure found in the popliteal fossa.  *See table(s) above for measurements and observations. Electronically signed by Larae Grooms MD on 12/20/2020 at 10:26:40 PM.    Final     Microbiology: Recent Results (from the past 240 hour(s))  SARS CORONAVIRUS 2 (TAT 6-24 HRS) Nasopharyngeal Nasopharyngeal Swab     Status: None   Collection Time: 12/27/20  4:28 AM   Specimen: Nasopharyngeal Swab  Result Value Ref Range Status   SARS Coronavirus 2 NEGATIVE NEGATIVE Final    Comment: (NOTE) SARS-CoV-2 target nucleic acids are NOT DETECTED.  The SARS-CoV-2 RNA is generally detectable in upper and lower respiratory specimens during the acute phase of infection. Negative results do not preclude SARS-CoV-2 infection, do not rule out co-infections with other pathogens, and should not be used as the sole basis for treatment or other patient management decisions. Negative results must be combined with clinical observations, patient history, and epidemiological information. The expected result is Negative.  Fact Sheet for  Patients: SugarRoll.be  Fact Sheet for Healthcare Providers: https://www.woods-mathews.com/  This test is not yet approved or cleared by the [place] FDA and  has been authorized for detection and/or diagnosis of SARS-CoV-2 by FDA under an Emergency Use Authorization (  EUA). This EUA will remain  in effect (meaning this test can be used) for the duration of the COVID-19 declaration under Se ction 564(b)(1) of the Act, 21 U.S.C. section 360bbb-3(b)(1), unless the authorization is terminated or revoked sooner.  Performed at [facility], [place] [address] [place], [place] [address]   MRSA PCR Screening     Status: None   Collection Time: 12/27/20  3:47 PM   Specimen: Nasal Mucosa; Nasopharyngeal  Result Value Ref Range Status   MRSA by PCR NEGATIVE NEGATIVE Final    Comment:        The GeneXpert MRSA Assay (FDA approved for NASAL specimens only), is one component of a comprehensive MRSA colonization surveillance program. It is not intended to diagnose MRSA infection nor to guide or monitor treatment for MRSA infections. Performed at [facility], [place] [address], [place], [place] [address]      Labs: Basic Metabolic Panel: Recent Labs  Lab 12/29/20 0103 12/30/20 0021 12/31/20 0025 01/01/21 0040 01/02/21 0025  NA 139 139 139 141 138  K 4.8 4.6 3.9 4.5 4.1  CL 109 108 109 112* 109  CO2 21* 20* 18* 19* 19*  GLUCOSE 115* 110* 107* 108* 101*  BUN 48* 53* 41* 34* 30*  CREATININE 2.34* 2.23* 1.98* 1.87* 1.92*  CALCIUM 9.1 8.8* 8.4* 8.6* 8.6*   Liver Function Tests: Recent Labs  Lab 12/26/20 2030  AST 21  ALT 19  ALKPHOS 53  BILITOT 0.3  PROT 6.2*  ALBUMIN 3.5   No results for input(s): LIPASE, AMYLASE in the last 168 hours. No results for input(s): AMMONIA in the last 168 hours. CBC: Recent Labs  Lab 12/26/20 2030 12/28/20 0028 12/29/20 0103 12/30/20 0021 12/31/20 0025 01/01/21 0040 01/02/21 0025   WBC 9.2   < > 7.9 7.4 6.8 7.8 7.0  NEUTROABS 7.1  --   --   --   --   --   --   HGB 9.3*   < > 9.4* 8.9* 8.5* 8.2* 8.0*  HCT 29.4*   < > 28.1* 28.0* 25.5* 26.1* 25.6*  MCV 89.6   < > 86.7 88.6 86.7 89.4 88.9  PLT 227   < > 192 188 175 161 158   < > = values in this interval not displayed.   Cardiac Enzymes: No results for input(s): CKTOTAL, CKMB, CKMBINDEX, TROPONINI in the last 168 hours. BNP: BNP (last 3 results) No results for input(s): BNP in the last 8760 hours.  ProBNP (last 3 results) No results for input(s): PROBNP in the last 8760 hours.  CBG: No results for input(s): GLUCAP in the last 168 hours.     Signed:  Domenic Polite MD.  [facility] 01/02/2021, 2:31 PM

## 2021-01-02 NOTE — Progress Notes (Signed)
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  Negative V/Q scan Stress test called off for HTN.  Troponin peak 1,567.  Discussed LCP with family 12/31/20 but at that time the risk of AKI concerned family:  Planned for medical management with outpatient NM with BP control.  Patient notes that she is doing no further chest pain.  Since day prior notes diarrhea.  Improved blood pressures from prior but still some reading above 170 that occur with anxiety.    No chest pain or pressure.  No SOB/DOE and no PND/Orthopnea.  No weight gain or leg swelling.  No palpitations or syncope.  Inpatient Medications    Scheduled Meds: . apixaban  5 mg Oral BID  . capsaicin   Topical BID  . cilostazol  100 mg Oral BID  . escitalopram  20 mg Oral Daily  . famotidine  20 mg Oral Daily  . fluticasone  2 spray Each Nare Daily  . folic acid  1 mg Oral Daily  . hydrALAZINE  50 mg Oral Q8H  . isosorbide mononitrate  30 mg Oral Daily  . levothyroxine  50 mcg Oral QAC breakfast  . LORazepam  1 mg Oral Daily  . metoprolol tartrate  50 mg Oral BID  . regadenoson  0.4 mg Intravenous Once  . rosuvastatin  20 mg Oral Daily  . sodium chloride flush  3 mL Intravenous Q12H  . thiamine  100 mg Oral Daily   Continuous Infusions: . sodium chloride     PRN Meds: sodium chloride, acetaminophen **OR** acetaminophen, calcium carbonate (dosed in mg elemental calcium), camphor-menthol **AND** hydrOXYzine, diphenoxylate-atropine, docusate sodium, feeding supplement (NEPRO CARB STEADY), hydrALAZINE, HYDROcodone-acetaminophen, morphine  injection, nitroGLYCERIN, ondansetron **OR** ondansetron (ZOFRAN) IV, sodium chloride flush, sorbitol, zolpidem   Vital Signs    Vitals:   01/02/21 0000 01/02/21 0325 01/02/21 0400 01/02/21 0739  BP: (!) 165/53  (!) 144/54 (!) 172/86  Pulse: 69 72 70 82  Resp: 17 (!) 22 18 (!) 21  Temp: 98.6 F (37 C)  98.2 F (36.8 C) 98.7 F (37.1 C)  TempSrc: Oral  Oral Oral  SpO2: 97% 96% 97% 98%  Weight:      Height:        Intake/Output Summary (Last 24 hours) at 01/02/2021 0745 Last data filed at 01/02/2021 0005 Gross per 24 hour  Intake 349 ml  Output 1200 ml  Net -851 ml   Filed Weights   12/27/20 0915 12/27/20 1544  Weight: 57.6 kg 57.8 kg    Telemetry    SR, 6 AM transition to regular tachycardia  - Personally Reviewed  ECG    No new - Personally Reviewed  Physical Exam   GEN: No acute distress.   Neck: No JVD, Trach site c/di Cardiac: RRR, no murmurs, rubs, or gallops.  Respiratory: Clear to auscultation bilaterally. GI: Soft, nontender, non-distended  MS: No edema; No deformity. Neuro:  Nonfocal  Psych: Normal affect   Labs    Chemistry Recent Labs  Lab 12/26/20 2030 12/28/20 0028 12/31/20 0025 01/01/21 0040 01/02/21 0025  NA 139   < >  139 141 138  K 4.9   < > 3.9 4.5 4.1  CL 109   < > 109 112* 109  CO2 18*   < > 18* 19* 19*  GLUCOSE 124*   < > 107* 108* 101*  BUN 50*   < > 41* 34* 30*  CREATININE 2.10*   < > 1.98* 1.87* 1.92*  CALCIUM 8.9   < > 8.4* 8.6* 8.6*  PROT 6.2*  --   --   --   --   ALBUMIN 3.5  --   --   --   --   AST 21  --   --   --   --   ALT 19  --   --   --   --   ALKPHOS 53  --   --   --   --   BILITOT 0.3  --   --   --   --   GFRNONAA 24*   < > 26* 28* 27*  ANIONGAP 12   < > _0 < > = values in this interval not displayed.     Hematology Recent Labs  Lab 12/31/20 0025 01/01/21 0040 01/02/21 0025  WBC 6.8 7.8 7.0  RBC 2.94* 2.92* 2.88*  HGB 8.5* 8.2* 8.0*  HCT 25.5* 26.1* 25.6*  MCV 86.7 89.4 88.9  MCH 28.9  28.1 27.8  MCHC 33.3 31.4 31.3  RDW 13.2 13.4 13.4  PLT 175 161 158    Cardiac EnzymesNo results for input(s): TROPONINI in the last 168 hours. No results for input(s): TROPIPOC in the last 168 hours.   BNPNo results for input(s): BNP, PROBNP in the last 168 hours.   DDimer No results for input(s): DDIMER in the last 168 hours.   Radiology    No results found.  Cardiac Studies   Transthoracic Echocardiogram: Date: 12/27/20 Results: FINDINGS   Left Ventricle: Left ventricular ejection fraction, by estimation, is 55  to 60%. The left ventricle has normal function. The left ventricle has no  regional wall motion abnormalities. The left ventricular internal cavity  size was small. There is no left  ventricular hypertrophy. Left ventricular diastolic parameters are  indeterminate.  Right Ventricle: The right ventricular size is normal. No increase in  right ventricular wall thickness. Right ventricular systolic function is  normal.  Left Atrium: Left atrial size was normal in size.  Right Atrium: Right atrial size was normal in size.  Pericardium: There is no evidence of pericardial effusion.  Mitral Valve: The mitral valve is grossly normal. Mild mitral annular  calcification. No evidence of mitral valve regurgitation.  Tricuspid Valve: The tricuspid valve is grossly normal. Tricuspid valve  regurgitation is not demonstrated.  Aortic Valve: The aortic valve is tricuspid. There is mild aortic valve  annular calcification. Aortic valve regurgitation is not visualized. No  aortic stenosis is present.  Pulmonic Valve: The pulmonic valve was grossly normal. Pulmonic valve  regurgitation is trivial.  Aorta: The aortic root and ascending aorta are structurally normal, with  no evidence of dilitation.  IAS/Shunts: The atrial septum is grossly normal.   NM Stress Testing : Date: 12/30/20 Results: Cancelled   Patient Profile     75 y.o. female a troponin elevation in the setting  of a myriad of symptoms  Assessment & Plan    NSTEMI CKD IV PAD and HLD [place] Hypertension - continue ASA and eliquis (PAF, PAF, NSTEMI this admission with plan to medically manage, possible  related to HTN emergency) - continue Cilostazol 100 mg PO BID; no evidence of LV dysfunction - continue BB and Imdur 30 mg PO daily - Continue to uptitrate anti-hypertensives, presently increase hydralazine to 75 mg BID - pending pheo work up  -stable kidney function CP free  PAF - on eliquis and metoprolol - only one episodes through care  [place] will sign off.   Medication Recommendations:  IMDUR and hydralazine upititration, Has room to uptitrate BB as well if PAF becomes an recurrent issue (this AM tachycardia is regular and non consistent with AF) Other recommendations (labs, testing, etc):  Outpatient NM Stress if patient would consider future intervention (presently wishes for medical mgmt only Follow up as an outpatient:  We will arrange outpatient f/u  Discussed with patient and fmaily  For questions or updates, please contact [place] Please consult [email or web address] for contact info under Cardiology/STEMI.      Signed, Werner Lean, MD  01/02/2021, 7:45 AM

## 2021-01-02 NOTE — TOC Transition Note (Signed)
Transition of Care [facility]) - CM/SW Discharge Note   Patient Details  Name: [name] MRN: [id number] Date of Birth: 03/30/1946  Transition of [facility]) CM/SW Contact:  Zenon Mayo, RN Phone Number: 01/02/2021, 11:38 AM   Clinical Narrative:    NCM spoke with patient and son at the bedside, son states they will need 30 day coupon for eliquis, she has not been on eliquis before, NCM gave son the 30 day coupon for eliquis.  Patient lives with her other son and his wife. She is indep. Son takes her to her MD apts.  She has no issues with paying for medications. She has transportation.   Final next level of care: Home/Self Care Barriers to Discharge: No Barriers Identified   Patient Goals and CMS Choice Patient states their goals for this hospitalization and ongoing recovery are:: get better   Choice offered to / list presented to : NA  Discharge Placement                       Discharge Plan and Services   Discharge Planning Services: CM Consult Post Acute Care Choice: NA            DME Agency: NA       HH Arranged: NA          Social Determinants of Health (SDOH) Interventions     Readmission Risk Interventions Readmission Risk Prevention Plan 01/02/2021  Transportation Screening Complete  PCP or Specialist Appt within 3-5 Days Complete  HRI or [place] Complete  Social Work Consult for [place] Planning/Counseling Complete  Palliative Care Screening Not Applicable  Medication Review Press photographer) Complete  Some recent data might be hidden

## 2021-01-02 NOTE — Care Management Important Message (Signed)
Important Message  Patient Details  Name: AMARIS DELAFUENTE MRN: [id number] Date of Birth: 1946-05-20   Medicare Important Message Given:  Yes  Patient left prior to IM delivery    Joanthan Hlavacek 01/02/2021, 2:14 PM

## 2021-01-02 NOTE — TOC Initial Note (Signed)
Transition of Care [facility]) - Initial/Assessment Note    Patient Details  Name: [name] MRN: [id number] Date of Birth: November 16, 1946  Transition of [facility]) CM/SW Contact:    Zenon Mayo, RN Phone Number: 01/02/2021, 11:36 AM  Clinical Narrative:                 NCM spoke with patient and son at the bedside, son states they will need 30 day coupon for eliquis, she has not been on eliquis before, NCM gave son the 30 day coupon for eliquis.  Patient lives with her other son and his wife. She is indep. Son takes her to her MD apts.  She has no issues with paying for medications. She has transportation.   Expected Discharge Plan: Home/Self Care Barriers to Discharge: No Barriers Identified   Patient Goals and CMS Choice Patient states their goals for this hospitalization and ongoing recovery are:: get better   Choice offered to / list presented to : NA  Expected Discharge Plan and Services Expected Discharge Plan: Home/Self Care   Discharge Planning Services: CM Consult Post Acute Care Choice: NA Living arrangements for the past 2 months: Single Family Home Expected Discharge Date: 01/02/21                 DME Agency: NA       HH Arranged: NA          Prior Living Arrangements/Services Living arrangements for the past 2 months: Single Family Home Lives with:: Adult Children Patient language and need for interpreter reviewed:: Yes Do you feel safe going back to the place where you live?: Yes      Need for Family Participation in Patient Care: Yes (Comment) Care giver support system in place?: Yes (comment)   Criminal Activity/Legal Involvement Pertinent to Current Situation/Hospitalization: No - Comment as needed  Activities of Daily Living Home Assistive Devices/Equipment: None ADL Screening (condition at time of admission) Patient's cognitive ability adequate to safely complete daily activities?: Yes Is the patient deaf or have difficulty hearing?:  No Does the patient have difficulty seeing, even when wearing glasses/contacts?: No Does the patient have difficulty concentrating, remembering, or making decisions?: No Patient able to express need for assistance with ADLs?: Yes Does the patient have difficulty dressing or bathing?: No Independently performs ADLs?: Yes (appropriate for developmental age) Does the patient have difficulty walking or climbing stairs?: No Weakness of Legs: None Weakness of Arms/Hands: None  Permission Sought/Granted                  Emotional Assessment Appearance:: Appears stated age Attitude/Demeanor/Rapport:  (Appropriate) Affect (typically observed): Appropriate Orientation: : Oriented to Self,Oriented to Place,Oriented to  Time,Oriented to [place] Orientation (Suspected and/or reported Sundowners) Alcohol / Substance Use: Not Applicable Psych Involvement: No (comment)  Admission diagnosis:  Renal mass [N28.89] Elevated troponin [R77.8] Chest pain [R07.9] ACS (acute coronary syndrome) ([place]) [I24.9] Patient Active Problem List   Diagnosis Date Noted  . Elevated troponin 12/27/2020  . ACS (acute coronary syndrome) ([place]) 12/27/2020  . Other specified hypothyroidism 02/25/2020  . Senile osteoporosis 02/15/2020  . PAD (peripheral artery disease) ([place]) 02/15/2020  . Tinea corporis 02/15/2020  . Gastroesophageal reflux disease without esophagitis 09/09/2019  . Screen for colon cancer 01/19/2019  . Osteopenia 01/19/2019  . Breast cancer screening by mammogram 01/19/2019  . CKD stage 4 secondary to hypertension ([place]) 01/19/2019  . Thiamine deficiency 01/02/2018  . Pure hyperglyceridemia 12/29/2017  . Prediabetes 12/28/2017  .  Hyperlipidemia LDL goal <130 12/28/2017  . COPD (chronic obstructive pulmonary disease) with chronic bronchitis ([place]) 12/28/2017  . Dietary folate deficiency anemia 12/28/2017  . Moderate episode of recurrent major depressive disorder ([place]) 10/22/2017  .  Dysphagia, pharyngoesophageal phase 05/22/2014  . History of radiation therapy   . Chronic pain syndrome 09/03/2012  . Tobacco abuse 08/10/2012  . COPD (chronic obstructive pulmonary disease) ([place]) 08/10/2012  . 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 08/08/2012    Class: Chronic  . Hypertension   . GAD (generalized anxiety disorder)    PCP:  Janith Lima, MD Pharmacy:   [id number] - [place], [address] [place] [address] Phone: [phone] Fax: [phone]     Social Determinants of Health (SDOH) Interventions    Readmission Risk Interventions Readmission Risk Prevention Plan 01/02/2021  Transportation Screening Complete  PCP or Specialist Appt within 3-5 Days Complete  HRI or [place] Complete  Social Work Consult for [place] Planning/Counseling Complete  Palliative Care Screening Not Applicable  Medication Review Press photographer) Complete  Some recent data might be hidden

## 2021-01-02 NOTE — Progress Notes (Signed)
Patient said that she hasn't taken eliquis before. Talked case manager and CM gave 30 days coupon. Removed PIV access and pt's son and patient received discharge instructions. They understood it well. Son took her all belongings. HS [employer]

## 2021-01-04 ENCOUNTER — Other Ambulatory Visit: Payer: Self-pay | Admitting: Internal Medicine

## 2021-01-04 DIAGNOSIS — K219 Gastro-esophageal reflux disease without esophagitis: Secondary | ICD-10-CM

## 2021-01-04 DIAGNOSIS — R1314 Dysphagia, pharyngoesophageal phase: Secondary | ICD-10-CM

## 2021-01-07 ENCOUNTER — Other Ambulatory Visit: Payer: Self-pay

## 2021-01-07 ENCOUNTER — Inpatient Hospital Stay: Payer: Medicare Other

## 2021-01-07 ENCOUNTER — Encounter: Payer: Self-pay | Admitting: Hematology and Oncology

## 2021-01-07 ENCOUNTER — Telehealth: Payer: Self-pay | Admitting: Hematology and Oncology

## 2021-01-07 ENCOUNTER — Inpatient Hospital Stay: Payer: Medicare Other | Attending: Hematology and Oncology | Admitting: Hematology and Oncology

## 2021-01-07 VITALS — BP 126/64 | HR 72 | Temp 97.6°F | Resp 20 | Ht 64.0 in | Wt 123.4 lb

## 2021-01-07 DIAGNOSIS — I700 Atherosclerosis of aorta: Secondary | ICD-10-CM

## 2021-01-07 DIAGNOSIS — M48061 Spinal stenosis, lumbar region without neurogenic claudication: Secondary | ICD-10-CM

## 2021-01-07 DIAGNOSIS — F419 Anxiety disorder, unspecified: Secondary | ICD-10-CM | POA: Diagnosis not present

## 2021-01-07 DIAGNOSIS — I129 Hypertensive chronic kidney disease with stage 1 through stage 4 chronic kidney disease, or unspecified chronic kidney disease: Secondary | ICD-10-CM

## 2021-01-07 DIAGNOSIS — M79661 Pain in right lower leg: Secondary | ICD-10-CM

## 2021-01-07 DIAGNOSIS — D649 Anemia, unspecified: Secondary | ICD-10-CM | POA: Diagnosis not present

## 2021-01-07 DIAGNOSIS — N184 Chronic kidney disease, stage 4 (severe): Secondary | ICD-10-CM | POA: Diagnosis not present

## 2021-01-07 DIAGNOSIS — M4317 Spondylolisthesis, lumbosacral region: Secondary | ICD-10-CM | POA: Diagnosis not present

## 2021-01-07 DIAGNOSIS — M47816 Spondylosis without myelopathy or radiculopathy, lumbar region: Secondary | ICD-10-CM

## 2021-01-07 DIAGNOSIS — N3289 Other specified disorders of bladder: Secondary | ICD-10-CM

## 2021-01-07 DIAGNOSIS — E785 Hyperlipidemia, unspecified: Secondary | ICD-10-CM | POA: Diagnosis not present

## 2021-01-07 DIAGNOSIS — N2889 Other specified disorders of kidney and ureter: Secondary | ICD-10-CM

## 2021-01-07 DIAGNOSIS — I82461 Acute embolism and thrombosis of right calf muscular vein: Secondary | ICD-10-CM

## 2021-01-07 DIAGNOSIS — Z801 Family history of malignant neoplasm of trachea, bronchus and lung: Secondary | ICD-10-CM

## 2021-01-07 DIAGNOSIS — N200 Calculus of kidney: Secondary | ICD-10-CM | POA: Diagnosis not present

## 2021-01-07 DIAGNOSIS — Z9049 Acquired absence of other specified parts of digestive tract: Secondary | ICD-10-CM

## 2021-01-07 DIAGNOSIS — I824Z1 Acute embolism and thrombosis of unspecified deep veins of right distal lower extremity: Secondary | ICD-10-CM

## 2021-01-07 DIAGNOSIS — Z79899 Other long term (current) drug therapy: Secondary | ICD-10-CM | POA: Diagnosis not present

## 2021-01-07 DIAGNOSIS — Z808 Family history of malignant neoplasm of other organs or systems: Secondary | ICD-10-CM

## 2021-01-07 DIAGNOSIS — Z930 Tracheostomy status: Secondary | ICD-10-CM

## 2021-01-07 DIAGNOSIS — Z7901 Long term (current) use of anticoagulants: Secondary | ICD-10-CM

## 2021-01-07 DIAGNOSIS — Z8673 Personal history of transient ischemic attack (TIA), and cerebral infarction without residual deficits: Secondary | ICD-10-CM | POA: Diagnosis not present

## 2021-01-07 DIAGNOSIS — M5442 Lumbago with sciatica, left side: Secondary | ICD-10-CM

## 2021-01-07 DIAGNOSIS — Z87891 Personal history of nicotine dependence: Secondary | ICD-10-CM

## 2021-01-07 DIAGNOSIS — N281 Cyst of kidney, acquired: Secondary | ICD-10-CM | POA: Diagnosis not present

## 2021-01-07 DIAGNOSIS — Z923 Personal history of irradiation: Secondary | ICD-10-CM

## 2021-01-07 DIAGNOSIS — I739 Peripheral vascular disease, unspecified: Secondary | ICD-10-CM | POA: Diagnosis not present

## 2021-01-07 DIAGNOSIS — Z8521 Personal history of malignant neoplasm of larynx: Secondary | ICD-10-CM

## 2021-01-07 DIAGNOSIS — M47817 Spondylosis without myelopathy or radiculopathy, lumbosacral region: Secondary | ICD-10-CM | POA: Diagnosis not present

## 2021-01-07 DIAGNOSIS — Z931 Gastrostomy status: Secondary | ICD-10-CM

## 2021-01-07 DIAGNOSIS — K219 Gastro-esophageal reflux disease without esophagitis: Secondary | ICD-10-CM | POA: Diagnosis not present

## 2021-01-07 DIAGNOSIS — E538 Deficiency of other specified B group vitamins: Secondary | ICD-10-CM

## 2021-01-07 LAB — CBC WITH DIFFERENTIAL/PLATELET
Abs Immature Granulocytes: 0.19 10*3/uL — ABNORMAL HIGH (ref 0.00–0.07)
Basophils Absolute: 0.1 10*3/uL (ref 0.0–0.1)
Basophils Relative: 1 %
Eosinophils Absolute: 0.2 10*3/uL (ref 0.0–0.5)
Eosinophils Relative: 2 %
HCT: 30.8 % — ABNORMAL LOW (ref 36.0–46.0)
Hemoglobin: 9.8 g/dL — ABNORMAL LOW (ref 12.0–15.0)
Immature Granulocytes: 2 %
Lymphocytes Relative: 15 %
Lymphs Abs: 1.5 10*3/uL (ref 0.7–4.0)
MCH: 28.2 pg (ref 26.0–34.0)
MCHC: 31.8 g/dL (ref 30.0–36.0)
MCV: 88.5 fL (ref 80.0–100.0)
Monocytes Absolute: 0.8 10*3/uL (ref 0.1–1.0)
Monocytes Relative: 8 %
Neutro Abs: 7.3 10*3/uL (ref 1.7–7.7)
Neutrophils Relative %: 72 %
Platelets: 297 10*3/uL (ref 150–400)
RBC: 3.48 MIL/uL — ABNORMAL LOW (ref 3.87–5.11)
RDW: 14 % (ref 11.5–15.5)
WBC: 10 10*3/uL (ref 4.0–10.5)
nRBC: 0 % (ref 0.0–0.2)

## 2021-01-07 LAB — LACTATE DEHYDROGENASE: LDH: 198 U/L — ABNORMAL HIGH (ref 98–192)

## 2021-01-07 LAB — CMP (CANCER CENTER ONLY)
ALT: 21 U/L (ref 0–44)
AST: 15 U/L (ref 15–41)
Albumin: 3.9 g/dL (ref 3.5–5.0)
Alkaline Phosphatase: 63 U/L (ref 38–126)
Anion gap: 11 (ref 5–15)
BUN: 33 mg/dL — ABNORMAL HIGH (ref 8–23)
CO2: 17 mmol/L — ABNORMAL LOW (ref 22–32)
Calcium: 9.8 mg/dL (ref 8.9–10.3)
Chloride: 112 mmol/L — ABNORMAL HIGH (ref 98–111)
Creatinine: 2.7 mg/dL — ABNORMAL HIGH (ref 0.44–1.00)
GFR, Estimated: 18 mL/min — ABNORMAL LOW (ref >60)
Glucose, Bld: 114 mg/dL — ABNORMAL HIGH (ref 70–99)
Potassium: 5.1 mmol/L (ref 3.5–5.1)
Sodium: 140 mmol/L (ref 135–145)
Total Bilirubin: 0.3 mg/dL (ref 0.3–1.2)
Total Protein: 7.6 g/dL (ref 6.5–8.1)

## 2021-01-07 LAB — RETICULOCYTES
Immature Retic Fract: 21.8 % — ABNORMAL HIGH (ref 2.3–15.9)
RBC.: 3.39 MIL/uL — ABNORMAL LOW (ref 3.87–5.11)
Retic Count, Absolute: 66.8 10*3/uL (ref 19.0–186.0)
Retic Ct Pct: 2 % (ref 0.4–3.1)

## 2021-01-07 MED ORDER — HYDROCODONE-ACETAMINOPHEN 7.5-325 MG/15ML PO SOLN: 10.0000 mL | Freq: Two times a day (BID) | ORAL | 0 refills | Status: AC | PRN

## 2021-01-07 MED ORDER — AMOXICILLIN-POT CLAVULANATE 875-125 MG PO TABS: 1.0000 | ORAL_TABLET | Freq: Two times a day (BID) | ORAL | 0 refills | Status: DC

## 2021-01-07 NOTE — Progress Notes (Signed)
[facility] NOTE  Patient Care Team: Ann Lima, MD as PCP - General (Internal Medicine) Shaw, Ann Cheng, MD as PCP - Cardiology (Cardiology)  CHIEF COMPLAINTS/PURPOSE OF CONSULTATION:  Right lower extremity DVT  ASSESSMENT & PLAN:  No problem-specific Assessment & Plan notes found for this encounter.  Orders Placed This Encounter  Procedures  . CBC with Differential/Platelet    Standing Status:   Standing    Number of Occurrences:   22    Standing Expiration Date:   01/07/2022  . CMP ([place] only)    Standing Status:   Future    Number of Occurrences:   1    Standing Expiration Date:   01/07/2022  . Lactate dehydrogenase    Standing Status:   Future    Number of Occurrences:   1    Standing Expiration Date:   01/07/2022  . Reticulocytes    Standing Status:   Future    Number of Occurrences:   1    Standing Expiration Date:   01/07/2022  . Vitamin B12    Standing Status:   Future    Standing Expiration Date:   01/07/2022   1.  Right lower extremity soleal vein DVT, first episode, likely unprovoked. Given first episode of unprovoked DVT no prior history, I believe it is reasonable to consider about 3 to 6 months of anticoagulation and monitor.  Given her first DVT at the age of 89, I do not believe there is any clear reason for hypercoagulable work-up.  She denies any other symptoms to worry about occult malignancy. According to daughter, she is up-to-date with her mammogram.  She has not had a colonoscopy in many years but she is not interested in having one at this time. I have ordered a repeat ultrasound to be done in 3 months and she was asked to return to clinic in 3 months.  In the interim she was recommended to continue anticoagulation, discussed risk of life-threatening bleeding with blood thinners.  They all expressed understanding of the recommendations.  2.  Anemia, normocytic, normochromic, likely related to her worsening kidney  function.  No evidence of iron deficiency.  Recommended that doing a B12 lab but this was not drawn since there was a diagnosis error code. We have sent her a message to get this drawn at her nephrologist.  She may also be a good candidate for erythropoietin if the nephrologist agrees.  3.  Patient complains of some green discharge around the trach and some increased cough, worried about a possible infection, she was hoping to get a prescription for an antibiotic.  She denied any allergies.  Given high risk for secondary bacterial infection, prescribed her Augmentin, discussed common adverse effects from Augmentin.  4. Severe pain in the right lower extremity at the site of DVT, gave her a very short prescription of hydrocodone liquid form.  I discussed very clearly that this will not be refilled.  If she has chronic pain issues or back issues, she will have to discuss with her primary care physician about long-term pain management.  Return to clinic in 4 for evaluation of anemia.  HISTORY OF PRESENTING ILLNESS:   [name] 75 y.o. female is here because of DVT of right soleal vein.  This is a very pleasant 75 year old Spanish-speaking female patient who arrived with her daughter for an initial visit to discuss about recommendations for her recently diagnosed right lower extremity distal DVT.  [name]  started noticing an explosive pain in her right lower leg and went to see her PCP who ordered an ultrasound and this showed DVT of right soleal vein.  She also complains of some pain from her back radiating down both her legs.  She however says that she has been very active all along and with this pain in the lower leg, she is quite limited and was hoping that it would get better soon.  She denies any chest pain, shortness of breath, cough. She denies any change in bowel habits, urinary habits.  She has had history of laryngeal cancer had total laryngectomy, bilateral neck dissection, adjuvant  radiation and now has a trach, follows up regularly with ENT at [facility].  She also complains of some greenish discharge from her trach and is wondering if she has some kind of infection.  No provoking factors prior to the DVT, she denies any overt trauma, recent surgery, periods of immobilization episodes of dehydration, COVID-19.  No family history of thromboembolic disorders.  She has had multiple surgeries in the past, never had any perioperative or postoperative DVT/PE.  She has 3 live pregnancies and lost 1 child in her second trimester because of premature rupture of membranes.  Rest of the pertinent 10 point ROS as mentioned below   REVIEW OF SYSTEMS:   Constitutional: Denies fevers, chills or abnormal night sweats Eyes: Denies blurriness of vision, double vision or watery eyes Ears, nose, mouth, throat, and face: Denies mucositis or sore throat Respiratory: Some cough and discharge around the trach site according to the patient Cardiovascular: Denies palpitation, chest discomfort or lower extremity swelling Gastrointestinal:  Denies nausea, heartburn or change in bowel habits Skin: Denies abnormal skin rashes Lymphatics: Denies new lymphadenopathy or easy bruising Neurological:Denies numbness, tingling or new weaknesses Behavioral/Psych: Mood is stable, no new changes  All other systems were reviewed with the patient and are negative.  MEDICAL HISTORY:  Past Medical History:  Diagnosis Date  . Anemia   . Anxiety    takes Ativan prn  . Blood transfusion without reported diagnosis 09/15/12   2 units Prbc's  . Broken ribs   . Chronic back pain   . CKD (chronic kidney disease), stage IV ([place])   . Constipation    related to pain meds  . COPD (chronic obstructive pulmonary disease) ([place]) 08/10/2012   denies  . Depression   . Gastrostomy in place [facility])    removed  . GERD (gastroesophageal reflux disease)    takes Zantac daily  . Headache(784.0)   . Hiatal hernia 08/10/2012  .  History of radiation therapy 10/17/12-11/25/12   supraglottic larynx,high risk neck tumor bed 5880 cGy/28 sessions, high risk lymph node tumor bed 5600 cGy/20 sessions, mod risk lymph node tumor bed 5040 cGy/20 sessions  . Hypercholesteremia    takes Pravastatin daily  . Hypertension    takes Tribenzor and Atenolol daily  . Insomnia    takes Amitriptyline daily  . Nausea    takes Zofran prn  . PAD (peripheral artery disease) ([place])    noninvasive imaging in 2016  . Pneumonia   . 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 Shortness of breath dyspnea   . Stroke [facility]) 2011   denies. no residual  . Uterine cancer ([place])     SURGICAL HISTORY: Past Surgical History:  Procedure Laterality Date  . ABDOMINAL SURGERY     r/t uterine carcinoma  . APPENDECTOMY    . DIRECT LARYNGOSCOPY N/A  05/22/2014   Procedure: DIRECT LARYNGOSCOPY WITH ESOPHAGEAL DILATION;  Surgeon: Jerrell Belfast, MD;  Location: [facility];  Service: ENT;  Laterality: N/A;  . ESOPHAGOSCOPY WITH DILITATION N/A 05/29/2015   Procedure: ESOPHAGOSCOPY WITH ESOPHAGEAL DILITATION;  Surgeon: Jerrell Belfast, MD;  Location: [facility] OR;  Service: ENT;  Laterality: N/A;  . Gastrostomy Tube removed   2013  . GASTROSTOMY W/ FEEDING TUBE  13  . HERNIA REPAIR     child  . LARYNGETOMY  08/31/2012   Procedure: LARYNGECTOMY;  Surgeon: Jerrell Belfast, MD;  Location: [facility];  Service: ENT;  Laterality: N/A;  . LARYNGOSCOPY  08/10/2012   Procedure: LARYNGOSCOPY;  Surgeon: Jerrell Belfast, MD;  Location: [facility];  Service: ENT;  Laterality: N/A;  with biopsy  . RADICAL NECK DISSECTION  08/31/2012   Procedure: RADICAL NECK DISSECTION;  Surgeon: Jerrell Belfast, MD;  Location: [facility];  Service: ENT;  Laterality: Bilateral;  . TRACHEAL ESOPHOGEAL PUNCTURE WITH REPAIR STOMA N/A 09/08/2013   Procedure: TRACHEAL ESOPHOGEAL PUNCTURE WITH PLACEMENT OF  PROVOX PROSTHESIS ;  Surgeon: Jerrell Belfast, MD;  Location: [facility];  Service: ENT;  Laterality:  N/A;  . TRACHEOSTOMY TUBE PLACEMENT  08/10/2012   Procedure: TRACHEOSTOMY;  Surgeon: Jerrell Belfast, MD;  Location: [facility];  Service: ENT;  Laterality: N/A;    SOCIAL HISTORY: Social History   Socioeconomic History  . Marital status: Widowed    Spouse name: Not on file  . Number of children: 4  . Years of education: Not on file  . Highest education level: Not on file  Occupational History    Comment: retired Regulatory affairs officer  Tobacco Use  . Smoking status: Former Smoker    Packs/day: 0.25    Years: 50.00    Pack years: 12.50    Types: Cigarettes    Quit date: 05/27/2013    Years since quitting: 7.6  . Smokeless tobacco: Never Used  Vaping Use  . Vaping Use: Never used  Substance and Sexual Activity  . Alcohol use: Yes    Comment: 1-2 shots per week  . Drug use: No  . Sexual activity: Not Currently  Other Topics Concern  . Not on file  Social History Narrative  . Not on file   Social Determinants of Health   Financial Resource Strain: Not on file  Food Insecurity: Not on file  Transportation Needs: Not on file  Physical Activity: Not on file  Stress: Not on file  Social Connections: Not on file  Intimate Partner Violence: Not on file    FAMILY HISTORY: Family History  Problem Relation Age of Onset  . Throat cancer Father   . Brain cancer Brother   . CAD Neg Hx     ALLERGIES:  is allergic to xyzal [levocetirizine dihydrochloride].  MEDICATIONS:  Current Outpatient Medications  Medication Sig Dispense Refill  . amoxicillin-clavulanate (AUGMENTIN) 875-125 MG tablet Take 1 tablet by mouth 2 (two) times daily. 14 tablet 0  . APIXABAN (ELIQUIS) VTE STARTER PACK (10MG  AND 5MG ) Take 5 mg by mouth 2 (two) times daily.    . cilostazol (PLETAL) 100 MG tablet Take 1 tablet (100 mg total) by mouth 2 (two) times daily. 180 tablet 1  . diphenoxylate-atropine (LOMOTIL) 2.5-0.025 MG tablet Take 1 tablet by mouth 4 (four) times daily as needed for diarrhea or loose stools.  75 tablet 1  . Ensure (ENSURE) Take 237 mLs by mouth 2 (two) times daily.    [name] escitalopram (LEXAPRO) 20 MG tablet Take 1 tablet (  20 mg total) by mouth daily. 90 tablet 1  . famotidine (PEPCID) 40 MG tablet TAKE 1 TABLET BY MOUTH EVERY DAY 90 tablet 1  . fluticasone (FLONASE) 50 MCG/ACT nasal spray SPRAY 2 SPRAYS INTO EACH NOSTRIL EVERY DAY (Patient taking differently: Place 2 sprays into both nostrils daily.) 16 g 0  . hydrALAZINE (APRESOLINE) 50 MG tablet Take 1.5 tablets (75 mg total) by mouth 3 (three) times daily. 90 tablet 1  . HYDROcodone-acetaminophen (HYCET) 7.5-325 mg/15 ml solution Take 10 mLs by mouth 2 (two) times daily as needed for up to 6 days for moderate pain. 120 mL 0  . isosorbide mononitrate (IMDUR) 30 MG 24 hr tablet Take 1 tablet (30 mg total) by mouth daily. 30 tablet 0  . levothyroxine (SYNTHROID) 50 MCG tablet Take 1 tablet (50 mcg total) by mouth daily before breakfast. 90 tablet 0  . LORazepam (ATIVAN) 1 MG tablet Take 1 tablet (1 mg total) by mouth daily. 30 tablet 2  . metoprolol tartrate (LOPRESSOR) 50 MG tablet Take 1 tablet (50 mg total) by mouth 2 (two) times daily. 60 tablet 0  . rosuvastatin (CRESTOR) 20 MG tablet Take 1 tablet (20 mg total) by mouth daily. 90 tablet 1  . thiamine (VITAMIN B-1) 100 MG tablet Take 1 tablet (100 mg total) by mouth daily. 90 tablet 1  . UNABLE TO FIND Take 1-2 drops by mouth as needed (leg pain). CBD Oil    . folic acid (FOLVITE) 1 MG tablet Take 1 tablet (1 mg total) by mouth daily. (Patient not taking: Reported on 01/07/2021) 90 tablet 1   No current facility-administered medications for this visit.     PHYSICAL EXAMINATION: ECOG PERFORMANCE STATUS: 0 - Asymptomatic  Vitals:   01/07/21 0937  BP: 126/64  Pulse: 72  Resp: 20  Temp: 97.6 F (36.4 C)  SpO2: 100%   Filed Weights   01/07/21 0937  Weight: 123 lb 6.4 oz (56 kg)    GENERAL:alert, no distress and comfortable SKIN: skin color, texture, turgor are normal,  no rashes or significant lesions EYES: normal, conjunctiva are pink and non-injected, sclera clear OROPHARYNX: She has tracheostomy because of her history of laryngeal cancer. NECK: supple, thyroid normal size, non-tender, without nodularity LYMPH:  no palpable lymphadenopathy in the cervical, axillary or inguinal, tightness from her previous surgery LUNGS: clear to auscultation and percussion with normal breathing effort HEART: regular rate & rhythm and no murmurs and no lower extremity edema ABDOMEN:abdomen soft, non-tender and normal bowel sounds Musculoskeletal:no cyanosis of digits and no clubbing.  Bilateral lower extremities appear normal, no overt swelling post thrombotic syndrome. PSYCH: alert & oriented x 3 with fluent speech NEURO: no focal motor/sensory deficits  LABORATORY DATA:  I have reviewed the data as listed Lab Results  Component Value Date   WBC 10.0 01/07/2021   HGB 9.8 (L) 01/07/2021   HCT 30.8 (L) 01/07/2021   MCV 88.5 01/07/2021   PLT 297 01/07/2021     Chemistry      Component Value Date/Time   NA 140 01/07/2021 1037   K 5.1 01/07/2021 1037   CL 112 (H) 01/07/2021 1037   CO2 17 (L) 01/07/2021 1037   BUN 33 (H) 01/07/2021 1037   CREATININE 2.70 (H) 01/07/2021 1037      Component Value Date/Time   CALCIUM 9.8 01/07/2021 1037   ALKPHOS 63 01/07/2021 1037   AST 15 01/07/2021 1037   ALT 21 01/07/2021 1037   BILITOT 0.3 01/07/2021 1037  RADIOGRAPHIC STUDIES: I have personally reviewed the radiological images as listed and agreed with the findings in the report. DG Chest 2 View  Result Date: 12/27/2020 CLINICAL DATA:  Cough, fever EXAM: CHEST - 2 VIEW COMPARISON:  10/30/2015 FINDINGS: The heart size and mediastinal contours are within normal limits. Atherosclerotic calcification of the aortic knob. No focal airspace consolidation, pleural effusion, or pneumothorax. The visualized skeletal structures are unremarkable. IMPRESSION: No active  cardiopulmonary disease. Electronically Signed   By: Davina Poke D.O.   On: 12/27/2020 11:51   DG Lumbar Spine Complete  Result Date: 12/20/2020 CLINICAL DATA:  Left low back pain with sciatica. EXAM: LUMBAR SPINE - COMPLETE 4+ VIEW COMPARISON:  Abdomen and pelvis CT dated 05/02/2003. FINDINGS: Five non-rib-bearing lumbar vertebrae. Straightening of the normal lumbar lordosis. Minimal degenerative changes at the L2-3 level with mild-to-moderate disc space narrowing and minimal anterior spur formation with discogenic sclerosis. Mild facet degenerative changes at the L5-S1 level with associated grade 1 anterolisthesis. No pars defects or fractures seen. Normal bowel gas pattern. Atheromatous arterial calcifications. These make it difficult to detect urinary tract calculi. Possible tiny bilateral renal calculi. IMPRESSION: 1. Mild degenerative changes, as described above. 2. Possible tiny bilateral renal calculi. 3. No acute abnormality. Electronically Signed   By: Claudie Revering M.D.   On: 12/20/2020 15:23   US RENAL  Result Date: 12/27/2020 CLINICAL DATA:  Renal mass EXAM: RENAL / URINARY TRACT ULTRASOUND COMPLETE COMPARISON:  Same day CT abdomen pelvis, 12:34 a.m. FINDINGS: Right Kidney: Renal measurements: 9.4 x 5.4 x 4.8 cm = volume: 129 mL. Echogenicity within normal limits. Multiple simple appearing cysts measuring up to 2.5 cm. No solid mass or hydronephrosis visualized. Left Kidney: Renal measurements: 9.2 x 4.8 x 5.0 cm = volume: 114 mL mL. Echogenicity within normal limits. Multiple simple appearing cysts measuring up to 2.1 cm. No solid mass or hydronephrosis visualized. Bladder: Appears normal for degree of bladder distention. Other: None. IMPRESSION: Multiple bilateral simple appearing renal cysts. No evidence of solid mass, with particular attention to the superior pole of the right kidney. There is a simple cyst in this location which appears to correspond to finding of prior CT.  Electronically Signed   By: Eddie Candle M.D.   On: 12/27/2020 11:08   NM Pulmonary Perf and Vent  Result Date: 12/27/2020 CLINICAL DATA:  Squamous cell carcinoma with chest pain EXAM: NUCLEAR MEDICINE PERFUSION LUNG SCAN TECHNIQUE: Perfusion images were obtained in multiple projections after intravenous injection of radiopharmaceutical. Views: Anterior, posterior, left lateral, right lateral, RPO, LPO, RAO, LAO. RADIOPHARMACEUTICALS:  4.25 mCi Tc-38m MAA IV COMPARISON:  Chest radiograph December 27, 2020 FINDINGS: Radiotracer uptake is homogeneous and symmetric bilaterally. No perfusion defects evident. IMPRESSION: No perfusion defects evident. No findings indicative of pulmonary embolus. Electronically Signed   By: Lowella Grip III M.D.   On: 12/27/2020 13:55   ECHOCARDIOGRAM COMPLETE  Result Date: 12/27/2020    ECHOCARDIOGRAM REPORT   Patient Name:   LATIQUA DALOIA Date of Exam: 12/27/2020 Medical Rec #:  [id number]        Height:       64.0 in Accession #:    [id number]       Weight:       127.0 lb Date of Birth:  1946-09-12       BSA:          1.613 m Patient Age:    70 years         BP:  135/66 mmHg Patient Gender: F                HR:           69 bpm. Exam Location:  Inpatient Procedure: 2D Echo, Cardiac Doppler and Color Doppler Indications:    Elevated Troponin.  History:        Patient has no prior history of Echocardiogram examinations.                 Stroke, COPD and PAD, Signs/Symptoms:Dyspnea; Risk                 Factors:Hypertension and Dyslipidemia. CKD. Cancer. GERD.  Sonographer:    Jonelle Sidle Dance Referring Phys: [address] [place]  1. Left ventricular ejection fraction, by estimation, is 55 to 60%. The left ventricle has normal function. The left ventricle has no regional wall motion abnormalities. Left ventricular diastolic parameters are indeterminate.  2. Right ventricular systolic function is normal. The right ventricular size is normal.  3. The mitral valve  is grossly normal. No evidence of mitral valve regurgitation.  4. The aortic valve is tricuspid. Aortic valve regurgitation is not visualized. No aortic stenosis is present. Comparison(s): No prior Echocardiogram. Conclusion(s)/Recommendation(s): Normal biventricular function without evidence of hemodynamically significant valvular heart disease. FINDINGS  Left Ventricle: Left ventricular ejection fraction, by estimation, is 55 to 60%. The left ventricle has normal function. The left ventricle has no regional wall motion abnormalities. The left ventricular internal cavity size was small. There is no left ventricular hypertrophy. Left ventricular diastolic parameters are indeterminate. Right Ventricle: The right ventricular size is normal. No increase in right ventricular wall thickness. Right ventricular systolic function is normal. Left Atrium: Left atrial size was normal in size. Right Atrium: Right atrial size was normal in size. Pericardium: There is no evidence of pericardial effusion. Mitral Valve: The mitral valve is grossly normal. Mild mitral annular calcification. No evidence of mitral valve regurgitation. Tricuspid Valve: The tricuspid valve is grossly normal. Tricuspid valve regurgitation is not demonstrated. Aortic Valve: The aortic valve is tricuspid. There is mild aortic valve annular calcification. Aortic valve regurgitation is not visualized. No aortic stenosis is present. Pulmonic Valve: The pulmonic valve was grossly normal. Pulmonic valve regurgitation is trivial. Aorta: The aortic root and ascending aorta are structurally normal, with no evidence of dilitation. IAS/Shunts: The atrial septum is grossly normal.  LEFT VENTRICLE PLAX 2D LVIDd:         3.70 cm  Diastology LVIDs:         3.50 cm  LV e' medial:    4.13 cm/s LV PW:         1.30 cm  LV E/e' medial:  14.3 LV IVS:        0.90 cm  LV e' lateral:   3.70 cm/s LVOT diam:     1.70 cm  LV E/e' lateral: 15.9 LV SV:         41 LV SV Index:   25  LVOT Area:     2.27 cm  RIGHT VENTRICLE             IVC RV Basal diam:  2.20 cm     IVC diam: 1.50 cm RV S prime:     12.10 cm/s TAPSE (M-mode): 1.4 cm LEFT ATRIUM             Index       RIGHT ATRIUM          Index  LA diam:        3.70 cm 2.29 cm/m  RA Area:     9.07 cm LA Vol (A2C):   66.6 ml 41.29 ml/m RA Volume:   18.00 ml 11.16 ml/m LA Vol (A4C):   32.5 ml 20.15 ml/m LA Biplane Vol: 47.9 ml 29.70 ml/m  AORTIC VALVE LVOT Vmax:   88.60 cm/s LVOT Vmean:  51.100 cm/s LVOT VTI:    0.181 m  AORTA Ao Root diam: 3.00 cm Ao Asc diam:  3.00 cm MITRAL VALVE MV Area (PHT): 2.17 cm    SHUNTS MV Decel Time: 349 msec    Systemic VTI:  0.18 m MV E velocity: 58.90 cm/s  Systemic Diam: 1.70 cm MV A velocity: 90.00 cm/s MV E/A ratio:  0.65 Rudean Haskell MD Electronically signed by Rudean Haskell MD Signature Date/Time: 12/27/2020/4:40:12 PM    Final    CT Renal Stone Study  Result Date: 12/27/2020 CLINICAL DATA:  Flank pain, stone disease suspected EXAM: CT ABDOMEN AND PELVIS WITHOUT CONTRAST TECHNIQUE: Multidetector CT imaging of the abdomen and pelvis was performed following the standard protocol without IV contrast. COMPARISON:  CT 05/03/2003 FINDINGS: Lower chest: Lung bases are clear. Normal heart size. No pericardial effusion. Few calcifications on the mitral annulus. Coronary artery calcifications and distal thoracic aortic calcifications are present as well. Hypoattenuation of the cardiac blood pool, suggestive of anemia. Hepatobiliary: No visible focal liver lesion within limitations of this unenhanced CT. Normal liver attenuation. Smooth liver surface contour. Normal gallbladder and biliary tree without visible calcified gallstone. Pancreas: No pancreatic ductal dilatation or surrounding inflammatory changes. Spleen: Normal in size. No concerning splenic lesions. Punctate calcification in the spleen may be vascular or related to prior granulomatous disease (3/22) normal splenic size. No worrisome  splenic lesions. Adrenals/Urinary Tract: Normal adrenal glands. Since 2004, there has been development of numerous cysts throughout the kidneys, while many are fluid attenuation, there are several more hyperdense cysts including a 10 mm partially exophytic cyst in the interpolar right kidney (3/35 and a cyst with some layering hyperattenuation in the upper pole left kidney measuring 1.8 cm (3/27). Additionally, there is a more conspicuous almost solid-appearing lesion arising from the upper pole right kidney which measures approximately 2.5 x 2.2 x 2.8 cm, incompletely characterized on this unenhanced exam. Few punctate nonobstructing calculi are present in both kidneys. No obstructive urolithiasis is seen though there is slight asymmetric prominence of the left renal pelvis and proximal ureter without visible obstructing calculus or lesion. Multiple vascular calcifications are noted as well. Stomach/Bowel: Sliding-type hiatal hernia. Distal stomach and duodenum are unremarkable. No small bowel thickening or dilatation. No colonic dilatation or wall thickening. High attenuation enteric contrast media traverses part way through the transverse colon. No evidence of bowel obstruction. The appendix is not visualized. Correlate for history of appendectomy. No focal inflammation the vicinity of the cecum to suggest an occult appendicitis. Vascular/Lymphatic: Atherosclerotic calcifications within the abdominal aorta and branch vessels. Focal fusiform infrarenal abdominal aortic dilatation to 2.6 cm in diameter (6/47) arising approximately 1 cm below the renal artery ostia. No other aneurysm or ectasia. 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 No other major venous abnormalities. No enlarged abdominopelvic lymph nodes. Reproductive: Normal appearance of the uterus and adnexal structures. Other: No abdominopelvic free fluid or free gas. No bowel containing hernias. Musculoskeletal:  Multilevel degenerative changes are present in the imaged portions of the spine. No acute osseous abnormality or suspicious osseous lesion. IMPRESSION: 1.  Slight asymmetric prominence in stranding of the left renal pelvis and proximal ureter without visible obstructing calculus or lesion. Findings could reflect recently passed stone or infection. Correlate with patient's symptoms and urinalysis. No obstructive urolithiasis or frank hydronephrosis at this time. 2. Interval development of numerous cysts throughout the kidneys, while many are fluid attenuation, there are several more hyperdense which could reflect hemorrhagic or proteinaceous cyst. A more conspicuous solid-appearing lesion arising from the upper pole right kidney which measures approximately 2.5 x 2.2 x 2.8 cm, incompletely characterized on this unenhanced exam. Recommend further evaluation with nonemergent renal ultrasound. 3. Sliding-type hiatal hernia. 4. Hypoattenuation of the cardiac blood pool, suggestive of anemia. 5. Focal infrarenal abdominal aortic dilatation to 2.6 cm. Recommend follow-up ultrasound every 5 years. This recommendation follows ACR consensus guidelines: White Paper of the ACR Incidental Findings Committee II on Vascular Findings. J Am Coll Radiol 2013; 10:789-794. 6. Aortic Atherosclerosis (ICD10-I70.0). These results were called by telephone at the time of interpretation on 12/27/2020 at 12:53 am to provider [facility] , who verbally acknowledged these results. Electronically Signed   By: Lovena Le M.D.   On: 12/27/2020 00:53   VAS [place] LOWER EXTREMITY VENOUS (DVT)  Result Date: 12/30/2020  Lower Venous DVT Study Indications: Edema.  Comparison Study: no prior Performing Technologist: Abram Sander RVS  Examination Guidelines: A complete evaluation includes B-mode imaging, spectral Doppler, color Doppler, and power Doppler as needed of all accessible portions of each vessel. Bilateral testing is considered an integral  part of a complete examination. Limited examinations for reoccurring indications may be performed as noted. The reflux portion of the exam is performed with the patient in reverse Trendelenburg.  +-----+---------------+---------+-----------+----------+--------------+ RIGHTCompressibilityPhasicitySpontaneityPropertiesThrombus Aging +-----+---------------+---------+-----------+----------+--------------+ CFV  Full           Yes      Yes                                 +-----+---------------+---------+-----------+----------+--------------+   +---------+---------------+---------+-----------+----------+--------------+ LEFT     CompressibilityPhasicitySpontaneityPropertiesThrombus Aging +---------+---------------+---------+-----------+----------+--------------+ CFV      Full           Yes      Yes                                 +---------+---------------+---------+-----------+----------+--------------+ SFJ      Full                                                        +---------+---------------+---------+-----------+----------+--------------+ FV Prox  Full                                                        +---------+---------------+---------+-----------+----------+--------------+ FV Mid   Full                                                        +---------+---------------+---------+-----------+----------+--------------+ FV  DistalFull                                                        +---------+---------------+---------+-----------+----------+--------------+ PFV      Full                                                        +---------+---------------+---------+-----------+----------+--------------+ POP      Full           Yes      Yes                                 +---------+---------------+---------+-----------+----------+--------------+ PTV      Full                                                         +---------+---------------+---------+-----------+----------+--------------+ PERO     Full                                                        +---------+---------------+---------+-----------+----------+--------------+     Summary: RIGHT: - No evidence of common femoral vein obstruction.  LEFT: - There is no evidence of deep vein thrombosis in the lower extremity.  - No cystic structure found in the popliteal fossa.  *See table(s) above for measurements and observations. Electronically signed by Servando Snare MD on 12/30/2020 at 7:22:56 AM.    Final    VAS [place] LOWER EXTREMITY VENOUS (DVT)  Result Date: 12/20/2020  Lower Venous DVT Study Indications: Patient reports right calf pain x 2 weeks. She denies any SOB.  Risk Factors: None identified. Comparison Study: NA Performing Technologist: Sharlett Iles RVT  Examination Guidelines: A complete evaluation includes B-mode imaging, spectral Doppler, color Doppler, and power Doppler as needed of all accessible portions of each vessel. Bilateral testing is considered an integral part of a complete examination. Limited examinations for reoccurring indications may be performed as noted. The reflux portion of the exam is performed with the patient in reverse Trendelenburg.  +---------+---------------+---------+-----------+---------------+--------------+ RIGHT    CompressibilityPhasicitySpontaneityProperties     Thrombus Aging +---------+---------------+---------+-----------+---------------+--------------+ CFV      Full           Yes      Yes                                      +---------+---------------+---------+-----------+---------------+--------------+ SFJ      Full           Yes      Yes                                      +---------+---------------+---------+-----------+---------------+--------------+  FV Prox  Full           Yes      Yes                                       +---------+---------------+---------+-----------+---------------+--------------+ FV Mid   Full                                                             +---------+---------------+---------+-----------+---------------+--------------+ FV DistalFull           Yes      Yes                                      +---------+---------------+---------+-----------+---------------+--------------+ PFV      Full           Yes      Yes                                      +---------+---------------+---------+-----------+---------------+--------------+ POP      Full           Yes      Yes                                      +---------+---------------+---------+-----------+---------------+--------------+ PTV      Full           Yes      Yes                                      +---------+---------------+---------+-----------+---------------+--------------+ PERO     Full           Yes      Yes                                      +---------+---------------+---------+-----------+---------------+--------------+ Soleal   Partial        Yes      Yes        softly         Acute                                                      echogenic and                                                             dilated at .64  cm                            +---------+---------------+---------+-----------+---------------+--------------+ Gastroc  Full                                                             +---------+---------------+---------+-----------+---------------+--------------+   +---------+---------------+---------+-----------+----------+--------------+ LEFT     CompressibilityPhasicitySpontaneityPropertiesThrombus Aging +---------+---------------+---------+-----------+----------+--------------+ CFV      Full           Yes      Yes                                  +---------+---------------+---------+-----------+----------+--------------+ SFJ      Full           Yes      Yes                                 +---------+---------------+---------+-----------+----------+--------------+ FV Prox  Full           Yes      Yes                                 +---------+---------------+---------+-----------+----------+--------------+ FV Mid   Full           Yes      Yes                                 +---------+---------------+---------+-----------+----------+--------------+ FV DistalFull           Yes      Yes                                 +---------+---------------+---------+-----------+----------+--------------+ PFV      Full           Yes      Yes                                 +---------+---------------+---------+-----------+----------+--------------+ POP      Full           Yes      Yes                                 +---------+---------------+---------+-----------+----------+--------------+ PTV      Full           Yes      Yes                                 +---------+---------------+---------+-----------+----------+--------------+ PERO     Full           Yes      Yes                                 +---------+---------------+---------+-----------+----------+--------------+  Soleal   Full           Yes      Yes                                 +---------+---------------+---------+-----------+----------+--------------+ Gastroc  NV             Yes      Yes                                 +---------+---------------+---------+-----------+----------+--------------+ GSV      Full           Yes      Yes                                 +---------+---------------+---------+-----------+----------+--------------+ SSV      Full           Yes      Yes                                 +---------+---------------+---------+-----------+----------+--------------+    Findings reported to Mickel Baas at 2:45 pm.   Summary: RIGHT: - Findings consistent with acute deep vein thrombosis involving the right soleal veins. - Dilated soleal vein, measuring .64 cm. - No cystic structure found in the popliteal fossa. - Great saphenous vein not visualized.  LEFT: - No cystic structure found in the popliteal fossa.  *See table(s) above for measurements and observations. Electronically signed by Larae Grooms MD on 12/20/2020 at 10:26:40 PM.    Final     All questions were answered. The patient knows to call the clinic with any problems, questions or concerns. I spent 50 minutes in the care of this patient including H and P, review of records, counseling and coordination of care.     Benay Pike, MD 01/08/2021 7:28 AM

## 2021-01-07 NOTE — Progress Notes (Deleted)
Cardiology Office Note   Date:  01/07/2021   ID:  Ann, Shaw 06-Aug-1946, MRN [id number]  PCP:  Ann Lima, MD  Cardiologist: Dr. Mertie Moores, MD  No chief complaint on file.     History of Present Illness: [name] is a 75 y.o. female who presents for hospital follow-up, seen for [name].  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   She was recently seen in hospital consultation 12/27/2020 for the evaluation of elevated troponin.  The patient was feeling poorly with a multitude of symptoms at which time and a family member attempted to take her to the ED however in transit, EMS was called and she was found to have an O2 sat of 73% by EMS with BP at 240/100. O2 was applied to stoma. She was also having substernal chest pain, abdominal pain, and hyperventilating from the situation. EMS helped her into the ambulance and gave her a lorazepam and symptoms eased off fairly quickly. Total duration of CP ~3 minutes per family member. Of note, she had a recent dx of DVT and was started on Eliquis 12/20/20.   Due to her multiple symptoms Eliquis was changed to Heparin. VQ to rule out PE that was negative.   Echocardiogram performed which showed normal LV systolic function with G1 DD and trace P.  There was question of coronary spasm causing elevated troponin level with a peak at 684.  Plan was for [name] stress test however this was delayed several days due to inclement weather.     Rapid response called to nuclear medicine given persistently elevated SBP's greater than 225 mmHg with left flank pain and chest pain along with severe anxiety. Given this, stress test canceled.  There was long discussion with family and patient who felt that  with improved BP control conservative therapy would be preferred.   At cardiology signed off on 01/02/2021 she had no further chest pain and BP improved however still elevated with some readings greater than 170 mmHg.  Plan was to continue ASA and Eliquis for PAF and NSTEMI. She was continued on  IMDUR and hydralazine upititration, Has room to uptitrate BB as well if PAF becomes an recurrent issue (this AM tachycardia is regular and non consistent with AF) Other recommendations (labs, testing, etc):  Outpatient NM Stress if patient would consider future intervention (presently wishes for medical mgmt only Follow up as an outpatient:  We will arrange outpatient f/u        NSTEMI CKD IV PAD and HLD HTN and anxiety - continue ASA and heparin  - given a creatinine of 2.3; will plan for conservative management unless high risk study; if positive will need 48 hours off of eliquis and on heparin instead - continue Cilostazol 100 mg PO BID; no evidence of LV dysfunction - continue BB - SHARED DECISION MAKING:  Patient has significant risk of needing HD if pursuing LHC, but with novel troponin elevation and atypical CP with risk factors, and inability to perform high quality NM Stress due to HTN would recommend the following:  IMDUR 30 mg, return beta blocker (dose held in the AM for NM Stress).   We will plan on medically managing possible CAD. Will cycle new troponin.  If unable to adequately control CP syndrome,  will pursue diagnostic LHC on 01/01/21.  Patient and DIL aware of the risks of kidney dysfunction:  discussed with patient with interpretor, nursing and APP team.  Reaching out to primary doctor      1. Multiple symptoms including lightheadedness, numbness of her hands, face, shortness of breath and possible chest pain, with potential concomitant anxiety attack and significantly elevated BP - difficult to know chicken or the egg here, possible concomitant panic attack - troponin is  elevated at 681 - 684, transient chest pain reported (3 minutes total per daughter in law) - would cycle another value to see if downtrending yet - check echocardiogram - agree with changing Eliquis to heparin while this is investigated  - aortic dissection seems less likely given complete resolution of pain, equal pulses - there does appear to be BP differential (134/48 R, 104/45 L) so will obtain CXR to eval mediastinal contours. Lower chest was unremarkable on CT. Preliminarily discussed with MD - agree with VQ which will help [place] understand echo findings if there are any RV changes - incidental findings as noted on CT scan, no abdominal aortic dissection seen - lipids pending - will review EKG and clinical findings and relay further recs with MD  2. Hypertensive urgency - resolved with Ativan - follow and gradually add back home regimen as appropriate  3. AKI on CKD stage IV - home olmesartan-amlodipine-HCTZ on hold for now (can resume amlodipine if BP will allow) - follow - avoid contrasted studies for now  4. Acute on chronic anemia - per IM - may be due in part to renal disease  5. PAD - duplex 2016: Aorto-iliac disease, based on abnormal common femoral artery waveforms. Moderately decreased right ABI. Severely decreased left ABI. Abnormal great toe-brachial indices, bilaterally - infrarenal aortic dilation can be followed as OP - on Pletal as OP, can hold inpatient if needed given anemia  Past Medical History:  Diagnosis Date  . Anemia   . Anxiety    takes Ativan prn  . Blood transfusion without reported diagnosis 09/15/12   2 units Prbc's  . Broken ribs   . Chronic back pain   . CKD (chronic kidney disease), stage IV ([place])   . Constipation    related to pain meds  . COPD (chronic obstructive pulmonary disease) ([place]) 08/10/2012   denies  . Depression   . Gastrostomy in place [facility])    removed  . GERD (gastroesophageal reflux disease)    takes Zantac daily  .  Headache(784.0)   . Hiatal hernia 08/10/2012  . History of radiation therapy 10/17/12-11/25/12   supraglottic larynx,high risk neck tumor bed 5880 cGy/28 sessions, high risk lymph node tumor bed 5600 cGy/20 sessions, mod risk lymph node tumor bed 5040 cGy/20 sessions  . Hypercholesteremia    takes Pravastatin daily  . Hypertension    takes Tribenzor and Atenolol daily  . Insomnia    takes Amitriptyline daily  . Nausea    takes Zofran prn  . PAD (peripheral artery disease) ([place])    noninvasive imaging in 2016  . Pneumonia   . 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 Shortness of breath dyspnea   . Stroke [facility]) 2011   denies. no residual  . Uterine cancer [facility])     Past Surgical History:  Procedure Laterality Date  . ABDOMINAL SURGERY     r/t uterine carcinoma  . APPENDECTOMY    . DIRECT LARYNGOSCOPY N/A 05/22/2014   Procedure: DIRECT LARYNGOSCOPY WITH  ESOPHAGEAL DILATION;  Surgeon: Jerrell Belfast, MD;  Location: [place];  Service: ENT;  Laterality: N/A;  . ESOPHAGOSCOPY WITH DILITATION N/A 05/29/2015   Procedure: ESOPHAGOSCOPY WITH ESOPHAGEAL DILITATION;  Surgeon: Jerrell Belfast, MD;  Location[facility] OR;  Service: ENT;  Laterality: N/A;  . Gastrostomy Tube removed   2013  . GASTROSTOMY W/ FEEDING TUBE  12  . HERNIA REPAIR     child  . LARYNGETOMY  08/31/2012   Procedure: LARYNGECTOMY;  Surgeon: Jerrell Belfast, MD;  Location: [place];  Service: ENT;  Laterality: N/A;  . LARYNGOSCOPY  08/10/2012   Procedure: LARYNGOSCOPY;  Surgeon: Jerrell Belfast, MD;  Location: [facility];  Service: ENT;  Laterality: N/A;  with biopsy  . RADICAL NECK DISSECTION  08/31/2012   Procedure: RADICAL NECK DISSECTION;  Surgeon: Jerrell Belfast, MD;  Location: [place];  Service: ENT;  Laterality: Bilateral;  . TRACHEAL ESOPHOGEAL PUNCTURE WITH REPAIR STOMA N/A 09/08/2013   Procedure: TRACHEAL ESOPHOGEAL PUNCTURE WITH PLACEMENT OF  PROVOX PROSTHESIS ;  Surgeon: Jerrell Belfast, MD;  Location: [place];  Service: ENT;  Laterality: N/A;  . TRACHEOSTOMY TUBE PLACEMENT  08/10/2012   Procedure: TRACHEOSTOMY;  Surgeon: Jerrell Belfast, MD;  Location: [facility];  Service: ENT;  Laterality: N/A;     Current Outpatient Medications  Medication Sig Dispense Refill  . amoxicillin-clavulanate (AUGMENTIN) 875-125 MG tablet Take 1 tablet by mouth 2 (two) times daily. 14 tablet 0  . APIXABAN (ELIQUIS) VTE STARTER PACK (10MG  AND 5MG ) Take 5 mg by mouth 2 (two) times daily.    . cilostazol (PLETAL) 100 MG tablet Take 1 tablet (100 mg total) by mouth 2 (two) times daily. 180 tablet 1  . diphenoxylate-atropine (LOMOTIL) 2.5-0.025 MG tablet Take 1 tablet by mouth 4 (four) times daily as needed for diarrhea or loose stools. 75 tablet 1  . Ensure (ENSURE) Take 237 mLs by mouth 2 (two) times daily.    [name] escitalopram (LEXAPRO) 20 MG tablet Take 1 tablet (20 mg total) by mouth daily. 90 tablet 1  . famotidine (PEPCID) 40 MG tablet TAKE 1 TABLET BY MOUTH EVERY DAY 90 tablet 1  . fluticasone (FLONASE) 50 MCG/ACT nasal spray SPRAY 2 SPRAYS INTO EACH NOSTRIL EVERY DAY (Patient taking differently: Place 2 sprays into both nostrils daily.) 16 g 0  . folic acid (FOLVITE) 1 MG tablet Take 1 tablet (1 mg total) by mouth daily. (Patient not taking: Reported on 01/07/2021) 90 tablet 1  . hydrALAZINE (APRESOLINE) 50 MG tablet Take 1.5 tablets (75 mg total) by mouth 3 (three) times daily. 90 tablet 1  . HYDROcodone-acetaminophen (HYCET) 7.5-325 mg/15 ml solution Take 10 mLs by mouth 2 (two) times daily as needed for up to 6 days for moderate pain. 120 mL 0  . isosorbide mononitrate (IMDUR) 30 MG 24 hr tablet Take 1 tablet (30 mg total) by mouth daily. 30 tablet 0  . levothyroxine (SYNTHROID) 50 MCG tablet Take 1 tablet (50 mcg total) by mouth daily before breakfast. 90 tablet 0  . LORazepam (ATIVAN) 1 MG tablet Take 1 tablet (1 mg total) by mouth daily. 30 tablet 2  . metoprolol tartrate (LOPRESSOR) 50 MG tablet Take 1 tablet (50  mg total) by mouth 2 (two) times daily. 60 tablet 0  . rosuvastatin (CRESTOR) 20 MG tablet Take 1 tablet (20 mg total) by mouth daily. 90 tablet 1  . thiamine (VITAMIN B-1) 100 MG tablet Take 1 tablet (100 mg total) by mouth daily. 90 tablet 1  .  UNABLE TO FIND Take 1-2 drops by mouth as needed (leg pain). CBD Oil     No current facility-administered medications for this visit.    Allergies:   Xyzal [levocetirizine dihydrochloride]    Social History:  The patient  reports that she quit smoking about 7 years ago. Her smoking use included cigarettes. She has a 12.50 pack-year smoking history. She has never used smokeless tobacco. She reports current alcohol use. She reports that she does not use drugs.   Family History:  The patient's ***family history includes Brain cancer in her brother; Throat cancer in her father.    ROS:  Please see the history of present illness.   Otherwise, review of systems are positive for {NONE DEFAULTED:18576::"none"}.   All other systems are reviewed and negative.    PHYSICAL EXAM: VS:  There were no vitals taken for this visit. , BMI There is no height or weight on file to calculate BMI. GEN: Well nourished, well developed, in no acute distress HEENT: normal Neck: no JVD, carotid bruits, or masses Cardiac: ***RRR; no murmurs, rubs, or gallops,no edema  Respiratory:  clear to auscultation bilaterally, normal work of breathing GI: soft, nontender, nondistended, + BS MS: no deformity or atrophy Skin: warm and dry, no rash Neuro:  Strength and sensation are intact Psych: euthymic mood, full affect   EKG:  EKG {ACTION; IS/IS JIR:67893810} ordered today. The ekg ordered today demonstrates ***   Recent Labs: 12/27/2020: TSH 6.833 01/07/2021: ALT 21; BUN 33; Creatinine 2.70; Hemoglobin 9.8; Platelets 297; Potassium 5.1; Sodium 140    Lipid Panel    Component Value Date/Time   CHOL 177 12/27/2020 0912   TRIG 346 (H) 12/27/2020 0912   HDL 35 (L)  12/27/2020 0912   CHOLHDL 5.1 12/27/2020 0912   VLDL 69 (H) 12/27/2020 0912   LDLCALC 73 12/27/2020 0912   LDLDIRECT 88.0 02/22/2020 1141      Wt Readings from Last 3 Encounters:  01/07/21 123 lb 6.4 oz (56 kg)  12/27/20 127 lb 6.8 oz (57.8 kg)  12/20/20 127 lb (57.6 kg)      Other studies Reviewed: Additional studies/ records that were reviewed today include: ***. Review of the above records demonstrates: ***   ASSESSMENT AND PLAN:  1.  ***   Current medicines are reviewed at length with the patient today.  The patient {ACTIONS; HAS/DOES NOT HAVE:19233} concerns regarding medicines.  The following changes have been made:  {PLAN; NO CHANGE:13088:s}  Labs/ tests ordered today include: *** No orders of the defined types were placed in this encounter.    Disposition:   FU with *** in {gen number 1-75:102585} {Days to years:10300}  Signed, Kathyrn Drown, NP  01/07/2021 3:58 PM    [facility] [address], [place], [place]  [address] Phone: [phone]; Fax: [phone]

## 2021-01-07 NOTE — Telephone Encounter (Signed)
Scheduled appointment per 1/25 los. Spoke to patient who is aware of appointment date and time. Gave patient calendar print out.

## 2021-01-08 ENCOUNTER — Telehealth: Payer: Self-pay

## 2021-01-08 ENCOUNTER — Encounter: Payer: Self-pay | Admitting: Hematology and Oncology

## 2021-01-08 NOTE — Telephone Encounter (Signed)
-----   Message from Benay Pike, MD sent at 01/07/2021 10:34 PM EST ----- Please convey to patient Anemia is better and is likely from kidney disease. Then B12 couldn't be drawn, if they can get this done when they meet their kidney doctor, this is fine, if not they can come and get this drawn when they have time. Kidney numbers are getting worse, she should schedule appointment with nephrology as soon as possible.

## 2021-01-08 NOTE — Telephone Encounter (Signed)
I spoke with Janett Billow in order to inform her of Dr. Rob Hickman instructions and lab results for the Patient. Janett Billow verbalized understanding and states that she has already contacted a Nephrologist for Yeilyn but will be asking Sheng's PCP to check her Vitamin B12 level at her appointment next week.

## 2021-01-09 ENCOUNTER — Other Ambulatory Visit: Payer: Self-pay | Admitting: Internal Medicine

## 2021-01-09 DIAGNOSIS — F411 Generalized anxiety disorder: Secondary | ICD-10-CM

## 2021-01-09 LAB — METANEPHRINES, URINE, 24 HOUR
Metaneph Total, Ur: 29 ug/L
Metanephrines, 24H Ur: 61 ug/24 hr (ref 36–209)
Normetanephrine, 24H Ur: 237 ug/24 hr (ref 131–612)
Normetanephrine, Ur: 113 ug/L
Total Volume: 2100

## 2021-01-12 ENCOUNTER — Other Ambulatory Visit: Payer: Self-pay | Admitting: Family

## 2021-01-13 ENCOUNTER — Telehealth: Payer: Self-pay

## 2021-01-13 ENCOUNTER — Ambulatory Visit (INDEPENDENT_AMBULATORY_CARE_PROVIDER_SITE_OTHER): Payer: Medicare Other | Admitting: Family

## 2021-01-13 ENCOUNTER — Other Ambulatory Visit: Payer: Self-pay

## 2021-01-13 ENCOUNTER — Encounter: Payer: Self-pay | Admitting: Family

## 2021-01-13 ENCOUNTER — Ambulatory Visit: Payer: Medicare Other | Admitting: Internal Medicine

## 2021-01-13 ENCOUNTER — Inpatient Hospital Stay (HOSPITAL_COMMUNITY)
Admission: EM | Admit: 2021-01-13 | Discharge: 2021-01-16 | DRG: 641 | Disposition: A | Discharge status: Home / Self Care | Payer: Medicare Other | Attending: Family Medicine | Admitting: Family Medicine

## 2021-01-13 ENCOUNTER — Inpatient Hospital Stay (HOSPITAL_COMMUNITY): Payer: Medicare Other

## 2021-01-13 ENCOUNTER — Encounter (HOSPITAL_COMMUNITY): Payer: Self-pay | Admitting: Emergency Medicine

## 2021-01-13 VITALS — BP 120/76 | HR 74 | Temp 97.7°F | Ht 64.0 in

## 2021-01-13 DIAGNOSIS — G894 Chronic pain syndrome: Secondary | ICD-10-CM | POA: Diagnosis present

## 2021-01-13 DIAGNOSIS — E538 Deficiency of other specified B group vitamins: Secondary | ICD-10-CM

## 2021-01-13 DIAGNOSIS — Z931 Gastrostomy status: Secondary | ICD-10-CM | POA: Diagnosis not present

## 2021-01-13 DIAGNOSIS — E039 Hypothyroidism, unspecified: Secondary | ICD-10-CM | POA: Diagnosis present

## 2021-01-13 DIAGNOSIS — F419 Anxiety disorder, unspecified: Secondary | ICD-10-CM | POA: Diagnosis not present

## 2021-01-13 DIAGNOSIS — I10 Essential (primary) hypertension: Secondary | ICD-10-CM | POA: Diagnosis present

## 2021-01-13 DIAGNOSIS — I129 Hypertensive chronic kidney disease with stage 1 through stage 4 chronic kidney disease, or unspecified chronic kidney disease: Secondary | ICD-10-CM | POA: Diagnosis present

## 2021-01-13 DIAGNOSIS — I252 Old myocardial infarction: Secondary | ICD-10-CM

## 2021-01-13 DIAGNOSIS — K862 Cyst of pancreas: Secondary | ICD-10-CM | POA: Diagnosis not present

## 2021-01-13 DIAGNOSIS — E871 Hypo-osmolality and hyponatremia: Secondary | ICD-10-CM | POA: Diagnosis present

## 2021-01-13 DIAGNOSIS — N200 Calculus of kidney: Secondary | ICD-10-CM | POA: Diagnosis not present

## 2021-01-13 DIAGNOSIS — Z20822 Contact with and (suspected) exposure to covid-19: Secondary | ICD-10-CM | POA: Diagnosis present

## 2021-01-13 DIAGNOSIS — E038 Other specified hypothyroidism: Secondary | ICD-10-CM | POA: Diagnosis present

## 2021-01-13 DIAGNOSIS — N184 Chronic kidney disease, stage 4 (severe): Secondary | ICD-10-CM | POA: Diagnosis not present

## 2021-01-13 DIAGNOSIS — F411 Generalized anxiety disorder: Secondary | ICD-10-CM | POA: Diagnosis not present

## 2021-01-13 DIAGNOSIS — E872 Acidosis: Secondary | ICD-10-CM | POA: Diagnosis present

## 2021-01-13 DIAGNOSIS — Z808 Family history of malignant neoplasm of other organs or systems: Secondary | ICD-10-CM

## 2021-01-13 DIAGNOSIS — R079 Chest pain, unspecified: Secondary | ICD-10-CM | POA: Diagnosis not present

## 2021-01-13 DIAGNOSIS — F32A Depression, unspecified: Secondary | ICD-10-CM | POA: Diagnosis not present

## 2021-01-13 DIAGNOSIS — I249 Acute ischemic heart disease, unspecified: Secondary | ICD-10-CM | POA: Diagnosis not present

## 2021-01-13 DIAGNOSIS — J449 Chronic obstructive pulmonary disease, unspecified: Secondary | ICD-10-CM | POA: Diagnosis not present

## 2021-01-13 DIAGNOSIS — Z9221 Personal history of antineoplastic chemotherapy: Secondary | ICD-10-CM

## 2021-01-13 DIAGNOSIS — E7800 Pure hypercholesterolemia, unspecified: Secondary | ICD-10-CM | POA: Diagnosis not present

## 2021-01-13 DIAGNOSIS — Z9049 Acquired absence of other specified parts of digestive tract: Secondary | ICD-10-CM

## 2021-01-13 DIAGNOSIS — D631 Anemia in chronic kidney disease: Secondary | ICD-10-CM | POA: Diagnosis present

## 2021-01-13 DIAGNOSIS — Z7901 Long term (current) use of anticoagulants: Secondary | ICD-10-CM

## 2021-01-13 DIAGNOSIS — I739 Peripheral vascular disease, unspecified: Secondary | ICD-10-CM | POA: Diagnosis present

## 2021-01-13 DIAGNOSIS — N179 Acute kidney failure, unspecified: Secondary | ICD-10-CM | POA: Diagnosis not present

## 2021-01-13 DIAGNOSIS — Z888 Allergy status to other drugs, medicaments and biological substances status: Secondary | ICD-10-CM

## 2021-01-13 DIAGNOSIS — M545 Low back pain, unspecified: Secondary | ICD-10-CM | POA: Diagnosis not present

## 2021-01-13 DIAGNOSIS — I160 Hypertensive urgency: Secondary | ICD-10-CM | POA: Diagnosis present

## 2021-01-13 DIAGNOSIS — Z923 Personal history of irradiation: Secondary | ICD-10-CM

## 2021-01-13 DIAGNOSIS — Z881 Allergy status to other antibiotic agents status: Secondary | ICD-10-CM

## 2021-01-13 DIAGNOSIS — Z79899 Other long term (current) drug therapy: Secondary | ICD-10-CM

## 2021-01-13 DIAGNOSIS — Z8542 Personal history of malignant neoplasm of other parts of uterus: Secondary | ICD-10-CM

## 2021-01-13 DIAGNOSIS — E875 Hyperkalemia: Principal | ICD-10-CM | POA: Diagnosis present

## 2021-01-13 DIAGNOSIS — I480 Paroxysmal atrial fibrillation: Secondary | ICD-10-CM | POA: Diagnosis present

## 2021-01-13 DIAGNOSIS — K219 Gastro-esophageal reflux disease without esophagitis: Secondary | ICD-10-CM | POA: Diagnosis present

## 2021-01-13 DIAGNOSIS — Z87891 Personal history of nicotine dependence: Secondary | ICD-10-CM

## 2021-01-13 DIAGNOSIS — D49519 Neoplasm of unspecified behavior of unspecified kidney: Secondary | ICD-10-CM | POA: Diagnosis not present

## 2021-01-13 DIAGNOSIS — G4700 Insomnia, unspecified: Secondary | ICD-10-CM | POA: Diagnosis not present

## 2021-01-13 DIAGNOSIS — E785 Hyperlipidemia, unspecified: Secondary | ICD-10-CM | POA: Diagnosis present

## 2021-01-13 DIAGNOSIS — Z9002 Acquired absence of larynx: Secondary | ICD-10-CM

## 2021-01-13 DIAGNOSIS — Z79890 Hormone replacement therapy (postmenopausal): Secondary | ICD-10-CM

## 2021-01-13 DIAGNOSIS — I824Y1 Acute embolism and thrombosis of unspecified deep veins of right proximal lower extremity: Secondary | ICD-10-CM | POA: Diagnosis not present

## 2021-01-13 DIAGNOSIS — M549 Dorsalgia, unspecified: Secondary | ICD-10-CM | POA: Diagnosis not present

## 2021-01-13 DIAGNOSIS — Z8673 Personal history of transient ischemic attack (TIA), and cerebral infarction without residual deficits: Secondary | ICD-10-CM

## 2021-01-13 DIAGNOSIS — R42 Dizziness and giddiness: Secondary | ICD-10-CM | POA: Diagnosis not present

## 2021-01-13 DIAGNOSIS — Z86718 Personal history of other venous thrombosis and embolism: Secondary | ICD-10-CM

## 2021-01-13 DIAGNOSIS — Z8521 Personal history of malignant neoplasm of larynx: Secondary | ICD-10-CM

## 2021-01-13 DIAGNOSIS — N281 Cyst of kidney, acquired: Secondary | ICD-10-CM | POA: Diagnosis not present

## 2021-01-13 LAB — CBC
HCT: 32.2 % — ABNORMAL LOW (ref 36.0–46.0)
Hemoglobin: 10.1 g/dL — ABNORMAL LOW (ref 12.0–15.0)
MCH: 28.5 pg (ref 26.0–34.0)
MCHC: 31.4 g/dL (ref 30.0–36.0)
MCV: 90.7 fL (ref 80.0–100.0)
Platelets: 341 10*3/uL (ref 150–400)
RBC: 3.55 MIL/uL — ABNORMAL LOW (ref 3.87–5.11)
RDW: 13.8 % (ref 11.5–15.5)
WBC: 10.1 10*3/uL (ref 4.0–10.5)
nRBC: 0 % (ref 0.0–0.2)

## 2021-01-13 LAB — TROPONIN I (HIGH SENSITIVITY)
Troponin I (High Sensitivity): 1153 ng/L (ref <18)
Troponin I (High Sensitivity): 937 ng/L (ref <18)

## 2021-01-13 LAB — BASIC METABOLIC PANEL
Anion gap: 10 (ref 5–15)
BUN: 46 mg/dL — ABNORMAL HIGH (ref 8–23)
CO2: 13 mmol/L — ABNORMAL LOW (ref 22–32)
Calcium: 9.6 mg/dL (ref 8.9–10.3)
Chloride: 109 mmol/L (ref 98–111)
Creatinine, Ser: 3.79 mg/dL — ABNORMAL HIGH (ref 0.44–1.00)
GFR, Estimated: 12 mL/min — ABNORMAL LOW (ref >60)
Glucose, Bld: 134 mg/dL — ABNORMAL HIGH (ref 70–99)
Potassium: >7.5 mmol/L (ref 3.5–5.1)
Sodium: 132 mmol/L — ABNORMAL LOW (ref 135–145)

## 2021-01-13 LAB — COMPREHENSIVE METABOLIC PANEL
ALT: 18 U/L (ref 0–35)
AST: 20 U/L (ref 0–37)
Albumin: 4.4 g/dL (ref 3.5–5.2)
Alkaline Phosphatase: 57 U/L (ref 39–117)
BUN: 44 mg/dL — ABNORMAL HIGH (ref 6–23)
CO2: 12 mEq/L — ABNORMAL LOW (ref 19–32)
Calcium: 9.8 mg/dL (ref 8.4–10.5)
Chloride: 108 mEq/L (ref 96–112)
Creatinine, Ser: 3.76 mg/dL — ABNORMAL HIGH (ref 0.40–1.20)
GFR: 11.28 mL/min — CL (ref >60.00)
Glucose, Bld: 96 mg/dL (ref 70–99)
Potassium: 7.1 mEq/L (ref 3.5–5.1)
Sodium: 129 mEq/L — ABNORMAL LOW (ref 135–145)
Total Bilirubin: 0.3 mg/dL (ref 0.2–1.2)
Total Protein: 7.4 g/dL (ref 6.0–8.3)

## 2021-01-13 LAB — URINALYSIS, ROUTINE W REFLEX MICROSCOPIC
Bilirubin Urine: NEGATIVE
Glucose, UA: NEGATIVE mg/dL
Hgb urine dipstick: NEGATIVE
Ketones, ur: NEGATIVE mg/dL
Leukocytes,Ua: NEGATIVE
Nitrite: NEGATIVE
Protein, ur: 30 mg/dL — AB
Specific Gravity, Urine: 1.005 (ref 1.005–1.030)
pH: 5 (ref 5.0–8.0)

## 2021-01-13 LAB — POTASSIUM: Potassium: 6.5 mmol/L (ref 3.5–5.1)

## 2021-01-13 LAB — SARS CORONAVIRUS 2 BY RT PCR (HOSPITAL ORDER, PERFORMED IN ~~LOC~~ HOSPITAL LAB): SARS Coronavirus 2: NEGATIVE

## 2021-01-13 LAB — VITAMIN B12: Vitamin B-12: 983 pg/mL — ABNORMAL HIGH (ref 211–911)

## 2021-01-13 LAB — MAGNESIUM: Magnesium: 1.7 mg/dL (ref 1.7–2.4)

## 2021-01-13 MED ORDER — HYDRALAZINE HCL 25 MG PO TABS
50.0000 mg | ORAL_TABLET | Freq: Once | ORAL | Status: AC
  Administered 2021-01-13: 50 mg via ORAL
  Filled 2021-01-13: qty 2

## 2021-01-13 MED ORDER — ACETAMINOPHEN 650 MG RE SUPP: 650.0000 mg | Freq: Four times a day (QID) | RECTAL | Status: DC | PRN

## 2021-01-13 MED ORDER — INSULIN ASPART 100 UNIT/ML IV SOLN
5.0000 [IU] | Freq: Once | INTRAVENOUS | Status: AC
  Administered 2021-01-13: 5 [IU] via INTRAVENOUS

## 2021-01-13 MED ORDER — ACETAMINOPHEN 325 MG PO TABS
650.0000 mg | ORAL_TABLET | Freq: Four times a day (QID) | ORAL | Status: DC | PRN
  Administered 2021-01-15 – 2021-01-16 (×2): 650 mg via ORAL
  Filled 2021-01-13 (×2): qty 2

## 2021-01-13 MED ORDER — SODIUM ZIRCONIUM CYCLOSILICATE 10 G PO PACK
10.0000 g | PACK | Freq: Three times a day (TID) | ORAL | Status: DC
  Administered 2021-01-14 (×4): 10 g via ORAL
  Filled 2021-01-13 (×4): qty 1

## 2021-01-13 MED ORDER — ALBUTEROL SULFATE (2.5 MG/3ML) 0.083% IN NEBU
10.0000 mg | INHALATION_SOLUTION | Freq: Once | RESPIRATORY_TRACT | Status: AC
  Administered 2021-01-13: 10 mg via RESPIRATORY_TRACT
  Filled 2021-01-13: qty 12

## 2021-01-13 MED ORDER — HYDRALAZINE HCL 50 MG PO TABS
75.0000 mg | ORAL_TABLET | Freq: Three times a day (TID) | ORAL | Status: DC
  Administered 2021-01-14 – 2021-01-15 (×3): 75 mg via ORAL
  Filled 2021-01-13: qty 1
  Filled 2021-01-13 (×2): qty 3

## 2021-01-13 MED ORDER — SODIUM ZIRCONIUM CYCLOSILICATE 10 G PO PACK
10.0000 g | PACK | Freq: Every day | ORAL | Status: DC
  Administered 2021-01-13 – 2021-01-14 (×2): 10 g via ORAL
  Filled 2021-01-13 (×2): qty 1

## 2021-01-13 MED ORDER — APIXABAN 5 MG PO TABS
5.0000 mg | ORAL_TABLET | Freq: Two times a day (BID) | ORAL | Status: DC
  Administered 2021-01-13 – 2021-01-16 (×6): 5 mg via ORAL
  Filled 2021-01-13 (×6): qty 1

## 2021-01-13 MED ORDER — ONDANSETRON 8 MG PO TBDP: 8.0000 mg | ORAL_TABLET | Freq: Three times a day (TID) | ORAL | 0 refills | Status: DC | PRN

## 2021-01-13 MED ORDER — METOPROLOL TARTRATE 50 MG PO TABS
50.0000 mg | ORAL_TABLET | Freq: Two times a day (BID) | ORAL | Status: DC
Start: 2021-01-13 — End: 2021-01-16
  Administered 2021-01-13 – 2021-01-16 (×6): 50 mg via ORAL
  Filled 2021-01-13: qty 1
  Filled 2021-01-13: qty 2
  Filled 2021-01-13: qty 1
  Filled 2021-01-13: qty 2
  Filled 2021-01-13: qty 1
  Filled 2021-01-13: qty 2

## 2021-01-13 MED ORDER — DEXTROSE 50 % IV SOLN
1.0000 | Freq: Once | INTRAVENOUS | Status: AC
  Administered 2021-01-13: 50 mL via INTRAVENOUS
  Filled 2021-01-13: qty 50

## 2021-01-13 MED ORDER — SODIUM CHLORIDE 0.9 % IV BOLUS
1000.0000 mL | Freq: Once | INTRAVENOUS | Status: AC
  Administered 2021-01-13: 1000 mL via INTRAVENOUS

## 2021-01-13 MED ORDER — LEVOTHYROXINE SODIUM 50 MCG PO TABS
50.0000 ug | ORAL_TABLET | Freq: Every day | ORAL | Status: DC
  Administered 2021-01-14 – 2021-01-16 (×3): 50 ug via ORAL
  Filled 2021-01-13 (×3): qty 1

## 2021-01-13 MED ORDER — ISOSORBIDE MONONITRATE ER 30 MG PO TB24
30.0000 mg | ORAL_TABLET | Freq: Every day | ORAL | Status: DC
  Administered 2021-01-14 – 2021-01-16 (×3): 30 mg via ORAL
  Filled 2021-01-13 (×3): qty 1

## 2021-01-13 MED ORDER — ROSUVASTATIN CALCIUM 20 MG PO TABS
20.0000 mg | ORAL_TABLET | Freq: Every day | ORAL | Status: DC
Start: 2021-01-14 — End: 2021-01-16
  Administered 2021-01-14 – 2021-01-16 (×3): 20 mg via ORAL
  Filled 2021-01-13 (×3): qty 1

## 2021-01-13 MED ORDER — SODIUM CHLORIDE 0.9 % IV SOLN
1.0000 g | Freq: Once | INTRAVENOUS | Status: AC
  Administered 2021-01-13: 1 g via INTRAVENOUS
  Filled 2021-01-13: qty 10

## 2021-01-13 MED ORDER — LORAZEPAM 1 MG PO TABS
1.0000 mg | ORAL_TABLET | Freq: Two times a day (BID) | ORAL | Status: DC
Start: 2021-01-13 — End: 2021-01-16
  Administered 2021-01-13 – 2021-01-16 (×6): 1 mg via ORAL
  Filled 2021-01-13 (×6): qty 1

## 2021-01-13 MED ORDER — CILOSTAZOL 100 MG PO TABS
100.0000 mg | ORAL_TABLET | Freq: Two times a day (BID) | ORAL | Status: DC
  Administered 2021-01-13 – 2021-01-16 (×6): 100 mg via ORAL
  Filled 2021-01-13 (×7): qty 1

## 2021-01-13 MED ORDER — FLUTICASONE PROPIONATE 50 MCG/ACT NA SUSP
2.0000 | Freq: Every day | NASAL | Status: DC
  Administered 2021-01-14 – 2021-01-15 (×2): 2 via NASAL
  Filled 2021-01-13: qty 16

## 2021-01-13 NOTE — ED Notes (Signed)
Pt daughter would like a facetime call from her moms phone when doctors and nurses come in and out!

## 2021-01-13 NOTE — Telephone Encounter (Signed)
CRITICAL VALUE STICKER  CRITICAL VALUE: Potassium 7.1; CO2 12; GFR 11.28  RECEIVER (on-site recipient of call): Elza Rafter [facility]  DATE & TIME NOTIFIED:  01/13/21 at 1224  MESSENGER (representative from lab): Hope  MD NOTIFIED: Jodi Mourning FNP  TIME OF NOTIFICATION: 1227  RESPONSE: Awaiting response

## 2021-01-13 NOTE — Progress Notes (Signed)
[name] is a 75 y.o. female with the following history as recorded in EpicCare:  Patient Active Problem List   Diagnosis Date Noted  . Elevated troponin 12/27/2020  . ACS (acute coronary syndrome) ([place]) 12/27/2020  . Other specified hypothyroidism 02/25/2020  . Senile osteoporosis 02/15/2020  . PAD (peripheral artery disease) ([place]) 02/15/2020  . Tinea corporis 02/15/2020  . Gastroesophageal reflux disease without esophagitis 09/09/2019  . Screen for colon cancer 01/19/2019  . Osteopenia 01/19/2019  . Breast cancer screening by mammogram 01/19/2019  . CKD stage 4 secondary to hypertension ([place]) 01/19/2019  . Thiamine deficiency 01/02/2018  . Pure hyperglyceridemia 12/29/2017  . Prediabetes 12/28/2017  . Hyperlipidemia LDL goal <130 12/28/2017  . COPD (chronic obstructive pulmonary disease) with chronic bronchitis ([place]) 12/28/2017  . Dietary folate deficiency anemia 12/28/2017  . Moderate episode of recurrent major depressive disorder ([place]) 10/22/2017  . Dysphagia, pharyngoesophageal phase 05/22/2014  . History of radiation therapy   . Chronic pain syndrome 09/03/2012  . Tobacco abuse 08/10/2012  . COPD (chronic obstructive pulmonary disease) ([place]) 08/10/2012  . 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 Hypertension   . GAD (generalized anxiety disorder)     Current Outpatient Medications  Medication Sig Dispense Refill  . cilostazol (PLETAL) 100 MG tablet Take 1 tablet (100 mg total) by mouth 2 (two) times daily. 180 tablet 1  . diphenoxylate-atropine (LOMOTIL) 2.5-0.025 MG tablet Take 1 tablet by mouth 4 (four) times daily as needed for diarrhea or loose stools. 75 tablet 1  . Ensure (ENSURE) Take 237 mLs by mouth 2 (two) times daily.    [name] escitalopram (LEXAPRO) 20 MG tablet Take 1 tablet (20 mg total) by mouth daily. 90 tablet 1  . famotidine (PEPCID) 40 MG tablet TAKE 1 TABLET BY MOUTH EVERY DAY 90 tablet 1  . fluticasone (FLONASE) 50 MCG/ACT  nasal spray SPRAY 2 SPRAYS INTO EACH NOSTRIL EVERY DAY (Patient taking differently: Place 2 sprays into both nostrils daily.) 16 g 0  . folic acid (FOLVITE) 1 MG tablet Take 1 tablet (1 mg total) by mouth daily. 90 tablet 1  . hydrALAZINE (APRESOLINE) 50 MG tablet Take 1.5 tablets (75 mg total) by mouth 3 (three) times daily. 90 tablet 1  . HYDROcodone-acetaminophen (HYCET) 7.5-325 mg/15 ml solution Take 10 mLs by mouth 2 (two) times daily as needed for up to 6 days for moderate pain. 120 mL 0  . isosorbide mononitrate (IMDUR) 30 MG 24 hr tablet Take 1 tablet (30 mg total) by mouth daily. 30 tablet 0  . levothyroxine (SYNTHROID) 50 MCG tablet Take 1 tablet (50 mcg total) by mouth daily before breakfast. 90 tablet 0  . LORazepam (ATIVAN) 1 MG tablet TAKE 1 TABLET BY MOUTH EVERY DAY 30 tablet 2  . metoprolol tartrate (LOPRESSOR) 50 MG tablet Take 1 tablet (50 mg total) by mouth 2 (two) times daily. 60 tablet 0  . ondansetron (ZOFRAN ODT) 8 MG disintegrating tablet Take 1 tablet (8 mg total) by mouth every 8 (eight) hours as needed for nausea or vomiting. 30 tablet 0  . rosuvastatin (CRESTOR) 20 MG tablet Take 1 tablet (20 mg total) by mouth daily. 90 tablet 1  . thiamine (VITAMIN B-1) 100 MG tablet Take 1 tablet (100 mg total) by mouth daily. 90 tablet 1  . UNABLE TO FIND Take 1-2 drops by mouth as needed (leg pain). CBD Oil    . ELIQUIS DVT/PE STARTER PACK TAKE AS DIRECTED ON PACKAGE: START WITH  TWO-'5MG'$  TABLETS TWICE DAILY FOR 7 DAYS. ON DAY 8, SWITCH TO ONE-$RemoveBefor'5MG'[id number]$  TABLET TWICE DAILY. 74 each 0   No current facility-administered medications for this visit.    Allergies: Xyzal [levocetirizine dihydrochloride]  Past Medical History:  Diagnosis Date  . Anemia   . Anxiety    takes Ativan prn  . Blood transfusion without reported diagnosis 09/15/12   2 units Prbc's  . Broken ribs   . Chronic back pain   . CKD (chronic kidney disease), stage IV ([place])   . Constipation    related to pain meds  . COPD  (chronic obstructive pulmonary disease) ([place]) 08/10/2012   denies  . Depression   . Gastrostomy in place [facility])    removed  . GERD (gastroesophageal reflux disease)    takes Zantac daily  . Headache(784.0)   . Hiatal hernia 08/10/2012  . History of radiation therapy 10/17/12-11/25/12   supraglottic larynx,high risk neck tumor bed 5880 cGy/28 sessions, high risk lymph node tumor bed 5600 cGy/20 sessions, mod risk lymph node tumor bed 5040 cGy/20 sessions  . Hypercholesteremia    takes Pravastatin daily  . Hypertension    takes Tribenzor and Atenolol daily  . Insomnia    takes Amitriptyline daily  . Nausea    takes Zofran prn  . PAD (peripheral artery disease) ([place])    noninvasive imaging in 2016  . Pneumonia   . 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 Shortness of breath dyspnea   . Stroke [facility]) 2011   denies. no residual  . Uterine cancer [facility])     Past Surgical History:  Procedure Laterality Date  . ABDOMINAL SURGERY     r/t uterine carcinoma  . APPENDECTOMY    . DIRECT LARYNGOSCOPY N/A 05/22/2014   Procedure: DIRECT LARYNGOSCOPY WITH ESOPHAGEAL DILATION;  Surgeon: Jerrell Belfast, MD;  Location: [place];  Service: ENT;  Laterality: N/A;  . ESOPHAGOSCOPY WITH DILITATION N/A 05/29/2015   Procedure: ESOPHAGOSCOPY WITH ESOPHAGEAL DILITATION;  Surgeon: Jerrell Belfast, MD;  Location: [facility] OR;  Service: ENT;  Laterality: N/A;  . Gastrostomy Tube removed   2013  . GASTROSTOMY W/ FEEDING TUBE  12  . HERNIA REPAIR     child  . LARYNGETOMY  08/31/2012   Procedure: LARYNGECTOMY;  Surgeon: Jerrell Belfast, MD;  Location: [place];  Service: ENT;  Laterality: N/A;  . LARYNGOSCOPY  08/10/2012   Procedure: LARYNGOSCOPY;  Surgeon: Jerrell Belfast, MD;  Location: [facility];  Service: ENT;  Laterality: N/A;  with biopsy  . RADICAL NECK DISSECTION  08/31/2012   Procedure: RADICAL NECK DISSECTION;  Surgeon: Jerrell Belfast, MD;  Location: [place];  Service: ENT;  Laterality: Bilateral;   . TRACHEAL ESOPHOGEAL PUNCTURE WITH REPAIR STOMA N/A 09/08/2013   Procedure: TRACHEAL ESOPHOGEAL PUNCTURE WITH PLACEMENT OF  PROVOX PROSTHESIS ;  Surgeon: Jerrell Belfast, MD;  Location: [place];  Service: ENT;  Laterality: N/A;  . TRACHEOSTOMY TUBE PLACEMENT  08/10/2012   Procedure: TRACHEOSTOMY;  Surgeon: Jerrell Belfast, MD;  Location: [facility];  Service: ENT;  Laterality: N/A;    Family History  Problem Relation Age of Onset  . Throat cancer Father   . Brain cancer Brother   . CAD Neg Hx     Social History   Tobacco Use  . Smoking status: Former Smoker    Packs/day: 0.25    Years: 50.00    Pack years: 12.50    Types: Cigarettes    Quit date: 05/27/2013    Years  since quitting: 7.6  . Smokeless tobacco: Never Used  Substance Use Topics  . Alcohol use: Yes    Comment: 1-2 shots per week    Subjective:   Seen with daughter who serves as Optometrist;  Patient was admitted to the hospital on 12/26/2020- 01/02/21 with MI/ hypertensive urgency; has CKD stage 4- nephrology evaluation scheduled for next week; Scheduled to see cardiology later this week and nephrology on 01/22/21; Has seen hematologist regarding DVT- will stay on Eliquis for 3 months and then get re-checked in 3 months;  Continuing to struggle with low back pain/ pain in her legs- feels like she cannot bear weight; no loss of bowel or bladder control;  Patient admits to feeling very anxious- taking Ativan once per day; Was on Augmentin for infection last week and had some nausea; symptoms better since stopping the medication but nausea is still present;     Objective:  Vitals:   01/13/21 0950  BP: 120/76  Pulse: 74  Temp: 97.7 F (36.5 C)  TempSrc: Oral  SpO2: 96%  Height: $Remove'5\' 4"'PjpszQw$  (1.626 m)    General: Well developed, well nourished, in no acute distress  Skin : Warm and dry.  Head: Normocephalic and atraumatic  Eyes: Sclera and conjunctiva clear; pupils round and reactive to light; extraocular movements intact   Ears: External normal; canals clear; tympanic membranes normal  Oropharynx: Pink, supple. No suspicious lesions  Neck: Supple without thyromegaly, adenopathy  Lungs: Respirations unlabored; clear to auscultation bilaterally without wheeze, rales, rhonchi  CVS exam: normal rate and regular rhythm.  Neurologic: Alert and oriented; speech intact; face symmetrical;  In wheelchair  Assessment:  1. Primary hypertension   2. Low vitamin B12 level   3. Stage 4 chronic kidney disease ([place])   4. Acute low back pain, unspecified back pain laterality, unspecified whether sciatica present   5. Deep vein thrombosis (DVT) of proximal vein of right lower extremity, unspecified chronicity ([place])   6. GAD (generalized anxiety disorder)   7. ACS (acute coronary syndrome) ([place])     Plan:  1. Stable; continue same medications; 2. Check B12 level; 3. Check CMP today; keep planned follow-up with nephrology; if creatinine has increased substantially again today, will need to consider going back for inpatient treatment. 4. Order MRI of lumbar spine; 5. Stay on Eliquis; has seen hematology; 6. Increase Ativan to bid; 7. Keep planned follow-up with cardiology;  This visit occurred during the SARS-CoV-2 public health emergency.  Safety protocols were in place, including screening questions prior to the visit, additional usage of staff PPE, and extensive cleaning of exam room while observing appropriate contact time as indicated for disinfecting solutions.     No follow-ups on file.  Orders Placed This Encounter  Procedures  . MR Lumbar Spine Wo Contrast    Standing Status:   Future    Standing Expiration Date:   01/13/2022    Order Specific Question:   What is the patient's sedation requirement?    Answer:   No Sedation    Order Specific Question:   Does the patient have a pacemaker or implanted devices?    Answer:   No    Order Specific Question:   Preferred imaging location?    Answer:   [facility] (table limit-550lbs)  . Comp Met (CMET)    Standing Status:   Future    Number of Occurrences:   1    Standing Expiration Date:   01/13/2022  . Vitamin B12  Standing Status:   Future    Number of Occurrences:   1    Standing Expiration Date:   01/13/2022    Requested Prescriptions   Signed Prescriptions Disp Refills  . ondansetron (ZOFRAN ODT) 8 MG disintegrating tablet 30 tablet 0    Sig: Take 1 tablet (8 mg total) by mouth every 8 (eight) hours as needed for nausea or vomiting.

## 2021-01-13 NOTE — Progress Notes (Signed)
Spoke with daughter; she understands and will take her daughter directly to [facility] ER for further evaluation/ treatment. She understands to tell charge nurse at ER that mother is being sent with critically high potassium of 7.1;

## 2021-01-13 NOTE — ED Triage Notes (Signed)
Pt sent by doctor for potassium >7. Family reports pt not currently on dialysis. Pt with tracheostomy.

## 2021-01-13 NOTE — H&P (Addendum)
History and Physical    PLEASE NOTE THAT DRAGON DICTATION SOFTWARE WAS USED IN THE CONSTRUCTION OF THIS NOTE.   [name] QIW:[id number] DOB: 06-Nov-1946 DOA: 01/13/2021  PCP: Janith Lima, MD Patient coming from: home   I have personally briefly reviewed patient's old medical records in [place]  Chief Complaint: Outpatient lab abnormalities  HPI: [name] is a 75 y.o. female with medical history significant for recent hospitalization for NSTEMI, peripheral artery disease, paroxysmal atrial fibrillation chronically anticoagulated on Eliquis, hypertension, hyperlipidemia, stage IV chronic kidney disease with baseline creatinine 1.7-1.9, acquired hypothyroidism, laryngeal cancer status post laryngectomy in 2013 and s/p chemo/radiation, who is admitted to [facility] on 01/13/2021 with acute kidney injury superimposed on stage IV chronic kidney disease with hyperkalemia after presenting from home to [facility] Emergency Department for evaluation of outpatient labs reflecting worsening renal function as well as hyperkalemia.   The patient was recently hospitalized at [facility] from 12/26/2020 to 01/02/2021 for NSTEMI.  Presenting with an episode of diaphoresis, with high-sensitivity troponin high found to peak at 1567.  Cardiology was consulted, and found that the patient was unable to tolerate the recommended stress test as she became anxious with systolic blood pressures rising to greater than 200.  Additionally, left-sided heart cath was considered, but following discussion with patient regarding risk versus benefits in the setting of her history of chronic kidney disease, the decision was made by the patient as well as her family to refuse left-sided heart cath and proceed with medical management.  Modifications to her prior antihypertensive regimen to reflect this medical management presenting NSTEMI included discontinuation of prior atenolol, HCTZ, and losartan,  with initiation of metoprolol, Imdur, and hydralazine.   Additionally, during his previous hospitalization, hospital course was completed AKI superimposed on stage IV chronic kidney disease, with creatinine rising to 2.3 relative to her baseline of 1.7-1.9.  With improved antihypertensive control, creatinine trended back down, and was found to be within her baseline range of 1.9 at the time of discharge.  In the setting of no established outpatient nephrologist, the patient was referred to [facility] for outpatient follow-up.   In the interval since discharge from the hospital 01/02/2021, the patient's PCP has been following her renal function, with repeat BMP on 01/07/2021 showing creatinine of 2.70 relative to creatinine to 1.92 on 01/02/2021.  BMP was repeated again as an outpatient earlier today, with interval increase in creatinine to 3.76, and serum potassium of 7.5, prompting PCP to recommend the patient present to the emergency department for further evaluation and management thereof.  At the time of presentation to [facility] emergency department, the the patient is without acute complaint, stating that her presentation to the ED was purely on the basis of the after mentioned lab abnormalities.  Specifically, she denies any chest pain, diaphoresis, palpitations, nausea, vomiting, dizziness, presyncope, or syncope following recent discharge from the hospital.  Denies any shortness breath, orthopnea, or peripheral edema over that timeframe, as well as no rash.  Denies any recent abdominal pain, diarrhea, melena, hematochezia, or rash.  She reports that she has been taking her home antihypertensive regimen as prescribed, without any missed doses, with most recent and hypertensive medications received on the morning of 01/13/2021.  Other than the medication modifications made during most recent prior hospitalization, the patient denies any ensuing changes to her medication regimen  following discharge from hospital on 01/02/2021, including no initiation of diuretics.  Not currently on  an ACE inhibitor or an ARB.  Of note, she underwent echocardiogram during most recent hospitalization on 12/30/2020, which was notable for LVEF 55 to 60%, no evidence of focal wall motion abnormalities, no evidence of diastolic dysfunction, and no evidence of significant valvular pathology.  Denies any recent subjective fever, chills, rigors, or generalized myalgias.  No recent neck stiffness, rhinorrhea, rhinitis, sore throat, cough.  No recent traveling or known COVID-19 exposures.     ED Course:  Vital signs in the ED were notable for the following: Temperature max 98.8; heart rate 73-80; blood pressure 134/71 - 198/61respiratory rate 21-26; oxygen saturation 98 to 99% on room air.  Labs were notable for the following: BMP was notable for the following: Sodium 132, potassium 7.5, chloride 109, bicarbonate 13, anion gap 10, BUN 46, creatinine 3.79, calcium 9.6.  So magnesium found to be 1.7.  CBC was notable for the following: White blood cell count 10,000, with hemoglobin 10.1.  High-sensitivity troponin I found to be 1153, compared to peak value of 1567 on 12/30/2018 during most recent prior hospitalization.  Repeat high-sensitivity troponin I in the ED this evening showed a trend down to 937.  Screening COVID-19 test performed in the ED this evening, with result currently pending.  EKG, by way of comparison to most recent prior performed on 12/28/2020, showed sinus rhythm with heart rate 71, normal intervals, nonspecific T wave inversion in aVL and V1, of which to inversion aVL was also noted on most recent prior EKG, and showed no evidence of ST changes, including no evidence of ST elevation.  Patient's case was discussed with the on-call nephrologist, Dr. Candiss Norse, who will formally consult for acute kidney injury superimposed on stage IV chronic kidney disease as well as hyperkalemia. Dr.  Candiss Norse has ordered urinalysis with microscopy, random urine sodium, random urine potassium, random urine creatinine, with results of which are pending at this time.  Additionally, Dr. Candiss Norse has ordered a stat renal ultrasound, with result currently pending. He also recommends continued dosing of Lokelma, as further noted below.  While in the ED, the following were administered: Albuterol nebulizer treatment x1, NovoLog 5 units IV x1, 1 amp of D50, Lokelma 10 g p.o. x1 followed by ordering of Lokelma , 10 g p.o. 3 times daily, with next dose scheduled to occur at 2200 this evening, hydralazine 50 mg p.o. x1, calcium chloride 1 g IV x1, and 1 L normal saline bolus.    Review of Systems: As per HPI otherwise 10 point review of systems negative.   Past Medical History:  Diagnosis Date  . Anemia   . Anxiety    takes Ativan prn  . Blood transfusion without reported diagnosis 09/15/12   2 units Prbc's  . Broken ribs   . Chronic back pain   . CKD (chronic kidney disease), stage IV ([place])   . Constipation    related to pain meds  . COPD (chronic obstructive pulmonary disease) ([place]) 08/10/2012   denies  . Depression   . Gastrostomy in place [facility])    removed  . GERD (gastroesophageal reflux disease)    takes Zantac daily  . Headache(784.0)   . Hiatal hernia 08/10/2012  . History of radiation therapy 10/17/12-11/25/12   supraglottic larynx,high risk neck tumor bed 5880 cGy/28 sessions, high risk lymph node tumor bed 5600 cGy/20 sessions, mod risk lymph node tumor bed 5040 cGy/20 sessions  . Hypercholesteremia    takes Pravastatin daily  . Hypertension    takes Tribenzor and  Atenolol daily  . Insomnia    takes Amitriptyline daily  . Nausea    takes Zofran prn  . PAD (peripheral artery disease) ([place])    noninvasive imaging in 2016  . Pneumonia   . 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 Shortness of breath dyspnea   . Stroke [facility]) 2011   denies. no residual  .  Uterine cancer [facility])     Past Surgical History:  Procedure Laterality Date  . ABDOMINAL SURGERY     r/t uterine carcinoma  . APPENDECTOMY    . DIRECT LARYNGOSCOPY N/A 05/22/2014   Procedure: DIRECT LARYNGOSCOPY WITH ESOPHAGEAL DILATION;  Surgeon: Jerrell Belfast, MD;  Location: [place];  Service: ENT;  Laterality: N/A;  . ESOPHAGOSCOPY WITH DILITATION N/A 05/29/2015   Procedure: ESOPHAGOSCOPY WITH ESOPHAGEAL DILITATION;  Surgeon: Jerrell Belfast, MD;  Location: [facility] OR;  Service: ENT;  Laterality: N/A;  . Gastrostomy Tube removed   2013  . GASTROSTOMY W/ FEEDING TUBE  13  . HERNIA REPAIR     child  . LARYNGETOMY  08/31/2012   Procedure: LARYNGECTOMY;  Surgeon: Jerrell Belfast, MD;  Location: [place];  Service: ENT;  Laterality: N/A;  . LARYNGOSCOPY  08/10/2012   Procedure: LARYNGOSCOPY;  Surgeon: Jerrell Belfast, MD;  Location: [facility];  Service: ENT;  Laterality: N/A;  with biopsy  . RADICAL NECK DISSECTION  08/31/2012   Procedure: RADICAL NECK DISSECTION;  Surgeon: Jerrell Belfast, MD;  Location: [place];  Service: ENT;  Laterality: Bilateral;  . TRACHEAL ESOPHOGEAL PUNCTURE WITH REPAIR STOMA N/A 09/08/2013   Procedure: TRACHEAL ESOPHOGEAL PUNCTURE WITH PLACEMENT OF  PROVOX PROSTHESIS ;  Surgeon: Jerrell Belfast, MD;  Location: [place];  Service: ENT;  Laterality: N/A;  . TRACHEOSTOMY TUBE PLACEMENT  08/10/2012   Procedure: TRACHEOSTOMY;  Surgeon: Jerrell Belfast, MD;  Location: [facility];  Service: ENT;  Laterality: N/A;    Social History:  reports that she quit smoking about 7 years ago. Her smoking use included cigarettes. She has a 12.50 pack-year smoking history. She has never used smokeless tobacco. She reports current alcohol use. She reports that she does not use drugs.   Allergies  Allergen Reactions  . Xyzal [Levocetirizine Dihydrochloride] Itching  . Augmentin [Amoxicillin-Pot Clavulanate] Diarrhea and Nausea And Vomiting  . Tribenzor [Olmesartan-Amlodipine-Hctz] Other (See Comments)     "Hurt the kidneys"    Family History  Problem Relation Age of Onset  . Throat cancer Father   . Brain cancer Brother   . CAD Neg Hx      Prior to Admission medications   Medication Sig Start Date End Date Taking? Authorizing Provider  cilostazol (PLETAL) 100 MG tablet Take 1 tablet (100 mg total) by mouth 2 (two) times daily. 09/19/20  Yes Janith Lima, MD  ELIQUIS DVT/PE STARTER PACK TAKE AS DIRECTED ON PACKAGE: START WITH TWO-5MG  TABLETS TWICE DAILY FOR 7 DAYS. ON DAY 8, SWITCH TO ONE-5MG  TABLET TWICE DAILY. Patient taking differently: Take 5-10 mg by mouth See admin instructions. TAKE AS DIRECTED ON PACKAGE: START WITH TWO-5MG  TABLETS (10 mg) TWICE DAILY FOR 7 DAYS. ON DAY 8, SWITCH TO ONE-5MG  TABLET TWICE DAILY. 01/13/21  Yes Janith Lima, MD  escitalopram (LEXAPRO) 20 MG tablet Take 1 tablet (20 mg total) by mouth daily. 09/19/20  Yes Janith Lima, MD  famotidine (PEPCID) 40 MG tablet TAKE 1 TABLET BY MOUTH EVERY DAY Patient taking differently: Take 40 mg by mouth in the morning. 01/04/21  Yes Ronnald Ramp,  Arvid Right, MD  fluticasone (FLONASE) 50 MCG/ACT nasal spray SPRAY 2 SPRAYS INTO EACH NOSTRIL EVERY DAY Patient taking differently: Place 2 sprays into both nostrils daily. 06/10/20  Yes Raylene Everts, MD  hydrALAZINE (APRESOLINE) 50 MG tablet Take 1.5 tablets (75 mg total) by mouth 3 (three) times daily. Patient taking differently: Take 50 mg by mouth 3 (three) times daily. 01/02/21  Yes Domenic Polite, MD  isosorbide mononitrate (IMDUR) 30 MG 24 hr tablet Take 1 tablet (30 mg total) by mouth daily. 01/03/21  Yes Domenic Polite, MD  lactose free nutrition (BOOST) LIQD Take 237 mLs by mouth 2 (two) times daily between meals.   Yes [provider]  levothyroxine (SYNTHROID) 50 MCG tablet Take 1 tablet (50 mcg total) by mouth daily before breakfast. 12/20/20  Yes Marrian Salvage, FNP  loperamide (IMODIUM A-D) 2 MG tablet Take 2 mg by mouth 4 (four) times daily as needed  for diarrhea or loose stools.   Yes [provider]  LORazepam (ATIVAN) 1 MG tablet TAKE 1 TABLET BY MOUTH EVERY DAY Patient taking differently: Take 1 mg by mouth in the morning and at bedtime. 01/10/21  Yes Janith Lima, MD  metoprolol tartrate (LOPRESSOR) 50 MG tablet Take 1 tablet (50 mg total) by mouth 2 (two) times daily. 01/02/21  Yes Domenic Polite, MD  ondansetron (ZOFRAN ODT) 8 MG disintegrating tablet Take 1 tablet (8 mg total) by mouth every 8 (eight) hours as needed for nausea or vomiting. 01/13/21  Yes Marrian Salvage, FNP  rosuvastatin (CRESTOR) 20 MG tablet Take 1 tablet (20 mg total) by mouth daily. 09/19/20  Yes Janith Lima, MD  diphenoxylate-atropine (LOMOTIL) 2.5-0.025 MG tablet Take 1 tablet by mouth 4 (four) times daily as needed for diarrhea or loose stools. 09/19/20   Janith Lima, MD  Ensure (ENSURE) Take 237 mLs by mouth 2 (two) times daily.    [provider]  folic acid (FOLVITE) 1 MG tablet Take 1 tablet (1 mg total) by mouth daily. Patient not taking: No sig reported 09/19/20   Janith Lima, MD  HYDROcodone-acetaminophen (HYCET) 7.5-325 mg/15 ml solution Take 10 mLs by mouth 2 (two) times daily as needed for up to 6 days for moderate pain. Patient not taking: Reported on 01/13/2021 01/07/21 01/13/21  Benay Pike, MD  thiamine (VITAMIN B-1) 100 MG tablet Take 1 tablet (100 mg total) by mouth daily. Patient not taking: Reported on 01/13/2021 09/19/20   Janith Lima, MD  levocetirizine (XYZAL) 5 MG tablet TAKE 1 TABLET(5 MG) BY MOUTH EVERY EVENING 02/21/20 06/10/20  Janith Lima, MD     Objective    Physical Exam: Vitals:   01/13/21 1621 01/13/21 1712 01/13/21 1713 01/13/21 1800  BP: (!) 150/74  (!) 207/79 (!) 198/69  Pulse: 73  79 73  Resp: (!) 21  (!) 29 (!) 25  Temp:      TempSrc:      SpO2: 99% 99% 99% 99%    General: appears to be stated age; alert, oriented Skin: warm, dry, no rash Head:  AT/[place] Mouth:  Oral mucosa  membranes appear moist, normal dentition Neck: supple; trachea midline Heart:  RRR; did not appreciate any M/R/G Lungs: CTAB, did not appreciate any wheezes, rales, or rhonchi Abdomen: + BS; soft, ND, NT Vascular: 2+ pedal pulses b/l; 2+ radial pulses b/l Extremities: no peripheral edema, no muscle wasting Neuro: strength and sensation intact in upper and lower extremities b/l  Labs on Admission: I have personally reviewed following labs and imaging studies  CBC: Recent Labs  Lab 01/07/21 1037 01/13/21 1515  WBC 10.0 10.1  NEUTROABS 7.3  --   HGB 9.8* 10.1*  HCT 30.8* 32.2*  MCV 88.5 90.7  PLT 297 676   Basic Metabolic Panel: Recent Labs  Lab 01/07/21 1037 01/13/21 1037 01/13/21 1515 01/13/21 1905  NA 140 129* 132*  --   K 5.1 7.1 No hemolysis seen* >7.5* 6.5*  CL 112* 108 109  --   CO2 17* 12* 13*  --   GLUCOSE 114* 96 134*  --   BUN 33* 44* 46*  --   CREATININE 2.70* 3.76* 3.79*  --   CALCIUM 9.8 9.8 9.6  --   MG  --   --  1.7  --    GFR: Estimated Creatinine Clearance: 11.1 mL/min (A) (by C-G formula based on SCr of 3.79 mg/dL (H)). Liver Function Tests: Recent Labs  Lab 01/07/21 1037 01/13/21 1037  AST 15 20  ALT 21 18  ALKPHOS 63 57  BILITOT 0.3 0.3  PROT 7.6 7.4  ALBUMIN 3.9 4.4   No results for input(s): LIPASE, AMYLASE in the last 168 hours. No results for input(s): AMMONIA in the last 168 hours. Coagulation Profile: No results for input(s): INR, PROTIME in the last 168 hours. Cardiac Enzymes: No results for input(s): CKTOTAL, CKMB, CKMBINDEX, TROPONINI in the last 168 hours. BNP (last 3 results) No results for input(s): PROBNP in the last 8760 hours. HbA1C: No results for input(s): HGBA1C in the last 72 hours. CBG: No results for input(s): GLUCAP in the last 168 hours. Lipid Profile: No results for input(s): CHOL, HDL, LDLCALC, TRIG, CHOLHDL, LDLDIRECT in the last 72 hours. Thyroid Function Tests: No results for input(s): TSH, T4TOTAL,  FREET4, T3FREE, THYROIDAB in the last 72 hours. Anemia Panel: Recent Labs    01/13/21 1037  VITAMINB12 983*   Urine analysis:    Component Value Date/Time   COLORURINE YELLOW 12/27/2020 0041   APPEARANCEUR CLEAR 12/27/2020 0041   LABSPEC 1.013 12/27/2020 0041   PHURINE 6.0 12/27/2020 0041   GLUCOSEU NEGATIVE 12/27/2020 0041   GLUCOSEU NEGATIVE 09/19/2020 1432   HGBUR NEGATIVE 12/27/2020 0041   BILIRUBINUR NEGATIVE 12/27/2020 0041   BILIRUBINUR Negative 01/19/2019 1510   KETONESUR NEGATIVE 12/27/2020 0041   PROTEINUR >=300 (A) 12/27/2020 0041   UROBILINOGEN 0.2 09/19/2020 1432   NITRITE NEGATIVE 12/27/2020 0041   LEUKOCYTESUR NEGATIVE 12/27/2020 0041    Radiological Exams on Admission: No results found.   EKG: Independently reviewed, with result as described above.    Assessment/Plan   YVONE SLAPE is a 75 y.o. female with medical history significant for recent hospitalization for NSTEMI, peripheral artery disease, paroxysmal atrial fibrillation chronically anticoagulated on Eliquis, hypertension, hyperlipidemia, stage IV chronic kidney disease with baseline creatinine 1.7-1.9, acquired hypothyroidism, laryngeal cancer status post laryngectomy in 2013 and s/p chemo/radiation, who is admitted to [facility] on 01/13/2021 with acute kidney injury superimposed on stage IV chronic kidney disease with hyperkalemia after presenting from home to [facility] Emergency Department for evaluation of outpatient labs reflecting worsening renal function as well as hyperkalemia.    Principal Problem:   Hyperkalemia Active Problems:   Hypertension   AKI (acute kidney injury) ([place])   Other specified hypothyroidism   HLD (hyperlipidemia)    #) Hyperkalemia: presenting serum potassium of 7.5 in the setting of acute kidney injury superimposed on stage IV chronic kidney disease, in the absence  of associated EKG changes.  Has received the following already this evening: Albuterol nebulizer  treatment x1, NovoLog 5 units IV x1, 1 amp of D50, Lokelma 10 g p.o. x1 followed by ordering of Lokelma 10 g p.o. 3 times daily, with next dose scheduled to occur at 2200 this evening, calcium chloride 1 g IV x1 for myocardium protective purposes, and 1 L normal saline bolus.  Additionally, Dr. Candiss Norse of nephrology has been formally consulted, and agrees with continuation of currently ordered [facility], while expanding evaluation AKI, as further described below.   Plan: Continue, 1 g p.o. 3 times daily.  Repeat BMP at 0100 on 01/14/21 for close trending of interval serum potassium level.  If potassium level remains at the time, will plan to provide additional treatments with albuterol, IV insulin. monitor on symmetry.  Repeat BMP in the morning.  Serum magnesium level in the morning.  Ionized calcium level.  Work-up and management of acute kidney injury, as further described below.  Formal nephrology consult.      #)Acute kidney injury superimposed on stage IV chronic kidney disease: In the setting of a documented history of intraparenchymal disease with associated baseline creatinine of 1.7-1.9, the patient's creatinine has been noted to be trending up following discharge from recent hospitalization on 01/02/2021, with value of 2.7 on 01/07/2021 followed by today's value of 3.76.  Suspect potential contribution from hypertensive nephropathy. Nephrology formally consulted, with ensuing orders including random urine sodium, random.  Creatinine, random urine potassium, as well as urinalysis with microscopy, all of which are associated with results pending at this time.  Additionally, nephrology is ordered stat renal ultrasound, with result currently pending.  Of note, echocardiogram performed during recent hospitalization showed LVEF 55 to 60% and no evidence of diastolic dysfunction, rendering the possibility cardiorenal implications following recent NSTEMI to be less likely a significant contributor factor to  presenting acute kidney injury. Of note, presentation does not appear to be associated with acute volume overload or uremia, and there is not currently an anion gap metabolic acidosis. Overall, there does not appear to be an indication for urgent overnight HD at this time.  Plan: Follow for results of urinalysis with microscopy, urine electrolytes, as further specified above.  Will follow for result of renal ultrasound.  Monitor strict I's and O's daily weights.  10 to avoid nephrotoxic agents.  Avoid NSAIDs.  From nephrology consult, with additional recommendations to follow.  Repeat BMP 1 AM on 01/14/2019, as above.  Repeat BMP in the morning.  Check ionized calcium, serum magnesium level and serum phosphorus level.  Management of hypertension, as further described below.        #) Hypertension: Currently on the following antihypertensive medications as an outpatient multiple recent modifications during previous hospitalization, as further described above: Metoprolol tartrate, Imdur, and hydralazine, with which the patient has reported good compliance.  Blood pressure variable in the ED this evening, with systolic values ranging from the 130s to 190s.  Most recent doses of home antihypertensive medication prescription this morning, the patient blotching that she has missed her interval midday hydralazine.  Will provide a dose of hydralazine now, and resume home antihypertensive medications, with close monitoring of ensuing blood pressure and response to resumption of these meds.   Plan: Close monitoring of ensuing blood pressure via routine vital signs.  Resumption of home hypertensive medications, as above.        #) Recent NSTEMI: Recent hospitalization is for NSTEMI, as further described above.  Unable  to tolerate stress test during that hospitalization and following discussions regarding results potential left arm patient family elected to decline heart pursue medical management alone, as  further described above.  Current medical management as an outpatient includes: Rosuvastatin, metoprolol tartrate, Eliquis.  Of note, during his previous hospitalization, high-sensitivity troponin 1567, with this evening's value of 1153 representing trending down, with repeat troponin noted to continue to decline to 937.  The patient denies any recent chest pain, and presenting EKG shows no evidence of acute ischemic changes.  Plan: Continue Eliquis, Lopressor, rosuvastatin.  Monitor on telemetry in the setting of presenting AKI and hyperkalemia.  Repeat BMP and serum magnesium level in the morning.     #) Paroxysmal atrial fibrillation: Documented history of such. In the setting of a CHA2DS2-VASc score of 6, there is an indication for the patient to be on chronic anticoagulation for thromboembolic prophylaxis. Consistent with this, the patient is chronically anticoagulated on Eliquis. Home AV nodal blocking regimen: Metoprolol tartrate.  Most recent echocardiogram performed on 12/30/2018, as further detailed above. Presenting EKG normal sinus rhythm without evidence of acute ischemic changes. Appears to be in normal sinus rhythm at this time.    Plan: monitor strict I's & O's and daily weights. Repeat BMP in the morning. Check serum magnesium level. Continue home AV nodal blocking regimen, as above.  Continue home Eliquis.      #For this hyperlipidemia: On rosuvastatin as an outpatient.  Plan: Continue home statin.      #) Acquired hypothyroidism: On Synthroid as an outpatient.  With discharge summary from most recent hospitalization indicating recent increase in dose of this medication.  Consequently, will repeat TSH at this time, particularly the setting of hypertensive presenting blood pressures.  Plan: Check TSH, as above.  For now, continue current home dose of Synthroid.     DVT prophylaxis: Home Eliquis Code Status: Full code Disposition Plan: Per Rounding Team Consults  called: on-call nephrologist, Dr. Candiss Norse, formally consulted, as further described above Admission status: Inpatient; PCU    Of note, this patient was added by me to the following Admit List/Treatment Team:  mcadmits     PLEASE NOTE THAT DRAGON DICTATION SOFTWARE WAS USED IN THE CONSTRUCTION OF THIS NOTE.   Rhetta Mura DO [facility] Pager [phone] From [place]  01/13/2021, 9:16 PM

## 2021-01-13 NOTE — Telephone Encounter (Signed)
FNP states (at 1146) she spoke to daughter & she is taking pt to [place]

## 2021-01-13 NOTE — Consult Note (Addendum)
Nephrology Consult   Requesting provider: Rhetta Mura, DO  Assessment/Recommendations: [name] is a 75 y.o. female with a past medical history for uncontrolled HTN, CKD4, laryngeal Ca s/p laryngectomy, h/o recent NSTEMI presents with AKI and hyperkalemia.  AKI on CKD4:  -underlying CKD likely related to hypertensive arterionephrosclerosis (BL Cr around 1.7-1.9) -Suspecting her AKI is multifactorial: Prerenal injury in the setting of nausea/vomiting/diarrhea and possible "normotensive" AKI -Provided her potassium can be controlled, no urgent indications for renal replacement therapy at this junction -UA w/ sediment exam ordered -repeat renal u/s -hydration: just received NS bolus. She does endorse thirst and hunger therefore recommend she stays hydrated. Would hold off on further fluids alone for now provided her systolics are >998 -Continue to monitor daily Cr, Dose meds for GFR<15 -Monitor Daily I/Os, Daily weight  -Maintain MAP>65 for optimal renal perfusion.  -Continue to hold RAAS blockers and HCTZ, avoid further nephrotoxins including NSAIDS, Morphine.  Unless absolutely necessary, avoid CT with contrast and/or MRI with gadolinium.     Hypertension: -uncontrolled, would start with resuming her home medications. I do suspect anxiety is playing a major role here  Hyperkalemia. Likely related to AKI. Repeat RFP, lokelma 10g TID. If still hyperkalemic then would recommend lasix 80mg  IV x 1 dose with 250cc NS bolus. -Changed diet to renal diet  Hyponatremia -Improved with normal saline.  Suspecting the cause of her hyponatremia is due to AKI  Anemia of chronic kidney disease -Hgb currently okay/within range -Transfuse for Hgb<7 g/dL -check iron panel  Gean Quint, MD [facility] 01/13/2021 10:32 PM   _____________________________________________________________________________________   History of Present Illness: [name] is a 75 y.o.  female with a past medical history of CKD4, hypertension, laryngeal cancer status post radiation/chemo/laryngectomy, peripheral arterial disease, paroxysmal atrial fibrillation on Eliquis, DVT, hyperlipidemia, recent NSTEMI who presents to [facility] with AKI and hyperkalemia.  She was recently admitted from 1/13 to 01/02/2021 for NSTEMI.  She attempted to undergo stress test however she became anxious and her systolic pressures rose to greater than 200.  Following a discussion with the patient and family regarding risks and benefits regards to her chronic kidney disease, the patient and family decided to forego her left-sided heart cath and proceed with medical management.  During this admission her losartan and hydrochlorothiazide were discontinued (had a peak creatinine of 2.3).  She went to her primary care physician's office today and was found to have a creatinine of 3.8 and a potassium of 7.1 (no hemolysis) and she was instructed to go to the ER. On 01/07/2021, she followed up with her hematologist who prescribed her Augmentin for some green discharge around the trach site and cough.  Since taking Augmentin, daughter reports that patient has been having nausea, vomiting, and diarrhea.  She also was not drinking as much water as her daughter would like.  She also has been feeling weak. Of note, her Cr was already up to 2.7 at the time of that visit. Chronically, her blood pressure runs at the minimum of 338 systolic and her blood pressure from what I can gather per documentation has been in the 250 systolic which coincides with her home blood pressure checks as per daughter.  Apparently, her blood pressure has never been this low or in "normal range". She does endorse some upper pelvic/lower abdominal discomfort otherwise denies any fevers, chills, chest pain, worsening shortness of breath, dizziness, nausea, dysgeusia, loss of appetite (in fact, she endorses hunger), new tremors. She  does not use any NSAIDs  whatsoever given the fact that she is on chronic anticoagulation. They have an appointment at our office to establish care on 01/21/21.  Medications:  Current Facility-Administered Medications  Medication Dose Route Frequency Provider Last Rate Last Admin  . acetaminophen (TYLENOL) tablet 650 mg  650 mg Oral Q6H PRN Howerter, Justin B, DO       Or  . acetaminophen (TYLENOL) suppository 650 mg  650 mg Rectal Q6H PRN Howerter, Justin B, DO      . apixaban (ELIQUIS) tablet 5 mg  5 mg Oral BID Howerter, Justin B, DO   5 mg at 01/13/21 2139  . cilostazol (PLETAL) tablet 100 mg  100 mg Oral BID Howerter, Justin B, DO   100 mg at 01/13/21 2139  . [START ON 01/14/2021] fluticasone (FLONASE) 50 MCG/ACT nasal spray 2 spray  2 spray Each Nare Daily Howerter, Justin B, DO      . [START ON 01/14/2021] hydrALAZINE (APRESOLINE) tablet 75 mg  75 mg Oral TID Howerter, Justin B, DO      . [START ON 01/14/2021] isosorbide mononitrate (IMDUR) 24 hr tablet 30 mg  30 mg Oral Daily Howerter, Justin B, DO      . [START ON 01/14/2021] levothyroxine (SYNTHROID) tablet 50 mcg  50 mcg Oral QAC breakfast Howerter, Justin B, DO      . LORazepam (ATIVAN) tablet 1 mg  1 mg Oral BID Howerter, Justin B, DO   1 mg at 01/13/21 2138  . metoprolol tartrate (LOPRESSOR) tablet 50 mg  50 mg Oral BID Howerter, Justin B, DO   50 mg at 01/13/21 2138  . [START ON 01/14/2021] rosuvastatin (CRESTOR) tablet 20 mg  20 mg Oral Daily Howerter, Justin B, DO      . sodium zirconium cyclosilicate (LOKELMA) packet 10 g  10 g Oral Daily Wyvonnia Dusky, MD   10 g at 01/13/21 1742  . sodium zirconium cyclosilicate (LOKELMA) packet 10 g  10 g Oral TID Howerter, Justin B, DO       Current Outpatient Medications  Medication Sig Dispense Refill  . cilostazol (PLETAL) 100 MG tablet Take 1 tablet (100 mg total) by mouth 2 (two) times daily. 180 tablet 1  . ELIQUIS DVT/PE STARTER PACK TAKE AS DIRECTED ON PACKAGE: START WITH TWO-5MG  TABLETS TWICE DAILY FOR 7 DAYS.  ON DAY 8, SWITCH TO ONE-5MG  TABLET TWICE DAILY. (Patient taking differently: Take 5-10 mg by mouth See admin instructions. TAKE AS DIRECTED ON PACKAGE: START WITH TWO-5MG  TABLETS (10 mg) TWICE DAILY FOR 7 DAYS. ON DAY 8, SWITCH TO ONE-5MG  TABLET TWICE DAILY.) 74 each 0  . escitalopram (LEXAPRO) 20 MG tablet Take 1 tablet (20 mg total) by mouth daily. 90 tablet 1  . famotidine (PEPCID) 40 MG tablet TAKE 1 TABLET BY MOUTH EVERY DAY (Patient taking differently: Take 40 mg by mouth in the morning.) 90 tablet 1  . fluticasone (FLONASE) 50 MCG/ACT nasal spray SPRAY 2 SPRAYS INTO EACH NOSTRIL EVERY DAY (Patient taking differently: Place 2 sprays into both nostrils daily.) 16 g 0  . hydrALAZINE (APRESOLINE) 50 MG tablet Take 1.5 tablets (75 mg total) by mouth 3 (three) times daily. (Patient taking differently: Take 50 mg by mouth 3 (three) times daily.) 90 tablet 1  . isosorbide mononitrate (IMDUR) 30 MG 24 hr tablet Take 1 tablet (30 mg total) by mouth daily. 30 tablet 0  . lactose free nutrition (BOOST) LIQD Take 237 mLs by mouth 2 (two) times  daily between meals.    [name] levothyroxine (SYNTHROID) 50 MCG tablet Take 1 tablet (50 mcg total) by mouth daily before breakfast. 90 tablet 0  . loperamide (IMODIUM A-D) 2 MG tablet Take 2 mg by mouth 4 (four) times daily as needed for diarrhea or loose stools.    [name] LORazepam (ATIVAN) 1 MG tablet TAKE 1 TABLET BY MOUTH EVERY DAY (Patient taking differently: Take 1 mg by mouth in the morning and at bedtime.) 30 tablet 2  . metoprolol tartrate (LOPRESSOR) 50 MG tablet Take 1 tablet (50 mg total) by mouth 2 (two) times daily. 60 tablet 0  . ondansetron (ZOFRAN ODT) 8 MG disintegrating tablet Take 1 tablet (8 mg total) by mouth every 8 (eight) hours as needed for nausea or vomiting. 30 tablet 0  . rosuvastatin (CRESTOR) 20 MG tablet Take 1 tablet (20 mg total) by mouth daily. 90 tablet 1  . diphenoxylate-atropine (LOMOTIL) 2.5-0.025 MG tablet Take 1 tablet by mouth 4 (four)  times daily as needed for diarrhea or loose stools. 75 tablet 1  . Ensure (ENSURE) Take 237 mLs by mouth 2 (two) times daily.    . folic acid (FOLVITE) 1 MG tablet Take 1 tablet (1 mg total) by mouth daily. (Patient not taking: No sig reported) 90 tablet 1  . HYDROcodone-acetaminophen (HYCET) 7.5-325 mg/15 ml solution Take 10 mLs by mouth 2 (two) times daily as needed for up to 6 days for moderate pain. (Patient not taking: Reported on 01/13/2021) 120 mL 0  . thiamine (VITAMIN B-1) 100 MG tablet Take 1 tablet (100 mg total) by mouth daily. (Patient not taking: Reported on 01/13/2021) 90 tablet 1     ALLERGIES Xyzal [levocetirizine dihydrochloride], Augmentin [amoxicillin-pot clavulanate], and Tribenzor [olmesartan-amlodipine-hctz]  MEDICAL HISTORY Past Medical History:  Diagnosis Date  . Anemia   . Anxiety    takes Ativan prn  . Blood transfusion without reported diagnosis 09/15/12   2 units Prbc's  . Broken ribs   . Chronic back pain   . CKD (chronic kidney disease), stage IV ([place])   . Constipation    related to pain meds  . COPD (chronic obstructive pulmonary disease) ([place]) 08/10/2012   denies  . Depression   . Gastrostomy in place [facility])    removed  . GERD (gastroesophageal reflux disease)    takes Zantac daily  . Headache(784.0)   . Hiatal hernia 08/10/2012  . History of radiation therapy 10/17/12-11/25/12   supraglottic larynx,high risk neck tumor bed 5880 cGy/28 sessions, high risk lymph node tumor bed 5600 cGy/20 sessions, mod risk lymph node tumor bed 5040 cGy/20 sessions  . Hypercholesteremia    takes Pravastatin daily  . Hypertension    takes Tribenzor and Atenolol daily  . Insomnia    takes Amitriptyline daily  . Nausea    takes Zofran prn  . PAD (peripheral artery disease) ([place])    noninvasive imaging in 2016  . Pneumonia   . 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 Shortness of breath dyspnea   . Stroke [facility]) 2011   denies. no residual  .  Uterine cancer [facility])      SOCIAL HISTORY Social History   Socioeconomic History  . Marital status: Widowed    Spouse name: Not on file  . Number of children: 4  . Years of education: Not on file  . Highest education level: Not on file  Occupational History    Comment: retired Regulatory affairs officer  Tobacco Use  .  Smoking status: Former Smoker    Packs/day: 0.25    Years: 50.00    Pack years: 12.50    Types: Cigarettes    Quit date: 05/27/2013    Years since quitting: 7.6  . Smokeless tobacco: Never Used  Vaping Use  . Vaping Use: Never used  Substance and Sexual Activity  . Alcohol use: Yes    Comment: 1-2 shots per week  . Drug use: No  . Sexual activity: Not Currently  Other Topics Concern  . Not on file  Social History Narrative  . Not on file   Social Determinants of Health   Financial Resource Strain: Not on file  Food Insecurity: Not on file  Transportation Needs: Not on file  Physical Activity: Not on file  Stress: Not on file  Social Connections: Not on file  Intimate Partner Violence: Not on file     FAMILY HISTORY Family History  Problem Relation Age of Onset  . Throat cancer Father   . Brain cancer Brother   . CAD Neg Hx     Review of Systems: 12 systems reviewed Otherwise as per HPI, all other systems reviewed and negative  Physical Exam: Vitals:   01/13/21 1713 01/13/21 1800  BP: (!) 207/79 (!) 198/69  Pulse: 79 73  Resp: (!) 29 (!) 25  Temp:    SpO2: 99% 99%   No intake/output data recorded. No intake or output data in the 24 hours ending 01/13/21 2232 General: well-appearing, no acute distress HEENT: anicteric sclera, oropharynx clear without lesions, trach site c/d/i, dry mucosal membranes CV: regular rate, normal rhythm, no murmurs, no gallops, no rubs Lungs: clear to auscultation bilaterally, normal work of breathing Abd: soft, non-tender, non-distended Skin: no visible lesions or rashes Musculoskeletal: no edema Neuro: moves all  extremities spontaneously, awake/alert/engaged  Test Results Reviewed Lab Results  Component Value Date   NA 132 (L) 01/13/2021   K 6.5 (HH) 01/13/2021   CL 109 01/13/2021   CO2 13 (L) 01/13/2021   BUN 46 (H) 01/13/2021   CREATININE 3.79 (H) 01/13/2021   GFR 11.28 (LL) 01/13/2021   CALCIUM 9.6 01/13/2021   ALBUMIN 4.4 01/13/2021   PHOS 3.0 09/03/2012     I have reviewed all relevant outside healthcare records related to the patient's kidney injury.

## 2021-01-13 NOTE — ED Provider Notes (Addendum)
[facility] EMERGENCY DEPARTMENT Provider Note   CSN: [id number] Arrival date & time: 01/13/21  1455     History Chief Complaint  Patient presents with  . Abnormal Lab    [name] is a 74 y.o. female who is primarily Spanish-speaking with history of chronic kidney disease, COPD s/p tracheostomy, recent hospitalization for NSTEMI with discharge 3 days ago, presenting from [place] office with concern for hyperkalemia.  The patient a follow-up visit today and there was concern that her potassium was over 7.  They were told, the emergency department immediately.  The patient presents with her stepdaughter who helps translate at the bedside.  Patient herself has had no acute complaints.  She denies palpitations, chest pain, shortness of breath, dizziness, lightheadedness.  She has been compliant with all of her medications.  Her daughter reports she is due for 4 PM hydralazine for hypertension, but otherwise is taking her morning medicines.  HPI     Past Medical History:  Diagnosis Date  . Anemia   . Anxiety    takes Ativan prn  . Blood transfusion without reported diagnosis 09/15/12   2 units Prbc's  . Broken ribs   . Chronic back pain   . CKD (chronic kidney disease), stage IV ([place])   . Constipation    related to pain meds  . COPD (chronic obstructive pulmonary disease) ([place]) 08/10/2012   denies  . Depression   . Gastrostomy in place [facility])    removed  . GERD (gastroesophageal reflux disease)    takes Zantac daily  . Headache(784.0)   . Hiatal hernia 08/10/2012  . History of radiation therapy 10/17/12-11/25/12   supraglottic larynx,high risk neck tumor bed 5880 cGy/28 sessions, high risk lymph node tumor bed 5600 cGy/20 sessions, mod risk lymph node tumor bed 5040 cGy/20 sessions  . Hypercholesteremia    takes Pravastatin daily  . Hypertension    takes Tribenzor and Atenolol daily  . Insomnia    takes Amitriptyline daily  . Nausea    takes Zofran  prn  . PAD (peripheral artery disease) ([place])    noninvasive imaging in 2016  . Pneumonia   . 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 Shortness of breath dyspnea   . Stroke [facility]) 2011   denies. no residual  . Uterine cancer [facility])     Patient Active Problem List   Diagnosis Date Noted  . Hyperkalemia 01/13/2021  . HLD (hyperlipidemia) 01/13/2021  . Elevated troponin 12/27/2020  . ACS (acute coronary syndrome) ([place]) 12/27/2020  . Other specified hypothyroidism 02/25/2020  . Senile osteoporosis 02/15/2020  . PAD (peripheral artery disease) ([place]) 02/15/2020  . Tinea corporis 02/15/2020  . Gastroesophageal reflux disease without esophagitis 09/09/2019  . Screen for colon cancer 01/19/2019  . Osteopenia 01/19/2019  . Breast cancer screening by mammogram 01/19/2019  . CKD stage 4 secondary to hypertension ([place]) 01/19/2019  . Thiamine deficiency 01/02/2018  . Pure hyperglyceridemia 12/29/2017  . Prediabetes 12/28/2017  . Hyperlipidemia LDL goal <130 12/28/2017  . COPD (chronic obstructive pulmonary disease) with chronic bronchitis ([place]) 12/28/2017  . Dietary folate deficiency anemia 12/28/2017  . Moderate episode of recurrent major depressive disorder ([place]) 10/22/2017  . Dysphagia, pharyngoesophageal phase 05/22/2014  . History of radiation therapy   . Chronic pain syndrome 09/03/2012  . Tobacco abuse 08/10/2012  . AKI (acute kidney injury) ([place]) 08/10/2012  . COPD (chronic obstructive pulmonary disease) ([place]) 08/10/2012  . 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  08/08/2012    Class: Chronic  . Hypertension   . GAD (generalized anxiety disorder)     Past Surgical History:  Procedure Laterality Date  . ABDOMINAL SURGERY     r/t uterine carcinoma  . APPENDECTOMY    . DIRECT LARYNGOSCOPY N/A 05/22/2014   Procedure: DIRECT LARYNGOSCOPY WITH ESOPHAGEAL DILATION;  Surgeon: Jerrell Belfast, MD;  Location: [place];  Service: ENT;  Laterality: N/A;  .  ESOPHAGOSCOPY WITH DILITATION N/A 05/29/2015   Procedure: ESOPHAGOSCOPY WITH ESOPHAGEAL DILITATION;  Surgeon: Jerrell Belfast, MD;  Location: [facility] OR;  Service: ENT;  Laterality: N/A;  . Gastrostomy Tube removed   2013  . GASTROSTOMY W/ FEEDING TUBE  12  . HERNIA REPAIR     child  . LARYNGETOMY  08/31/2012   Procedure: LARYNGECTOMY;  Surgeon: Jerrell Belfast, MD;  Location: [place];  Service: ENT;  Laterality: N/A;  . LARYNGOSCOPY  08/10/2012   Procedure: LARYNGOSCOPY;  Surgeon: Jerrell Belfast, MD;  Location: [facility];  Service: ENT;  Laterality: N/A;  with biopsy  . RADICAL NECK DISSECTION  08/31/2012   Procedure: RADICAL NECK DISSECTION;  Surgeon: Jerrell Belfast, MD;  Location: [place];  Service: ENT;  Laterality: Bilateral;  . TRACHEAL ESOPHOGEAL PUNCTURE WITH REPAIR STOMA N/A 09/08/2013   Procedure: TRACHEAL ESOPHOGEAL PUNCTURE WITH PLACEMENT OF  PROVOX PROSTHESIS ;  Surgeon: Jerrell Belfast, MD;  Location: [place];  Service: ENT;  Laterality: N/A;  . TRACHEOSTOMY TUBE PLACEMENT  08/10/2012   Procedure: TRACHEOSTOMY;  Surgeon: Jerrell Belfast, MD;  Location: [facility];  Service: ENT;  Laterality: N/A;     OB History   No obstetric history on file.     Family History  Problem Relation Age of Onset  . Throat cancer Father   . Brain cancer Brother   . CAD Neg Hx     Social History   Tobacco Use  . Smoking status: Former Smoker    Packs/day: 0.25    Years: 50.00    Pack years: 12.50    Types: Cigarettes    Quit date: 05/27/2013    Years since quitting: 7.6  . Smokeless tobacco: Never Used  Vaping Use  . Vaping Use: Never used  Substance Use Topics  . Alcohol use: Yes    Comment: 1-2 shots per week  . Drug use: No    Home Medications Prior to Admission medications   Medication Sig Start Date End Date Taking? Authorizing Provider  cilostazol (PLETAL) 100 MG tablet Take 1 tablet (100 mg total) by mouth 2 (two) times daily. 09/19/20  Yes Janith Lima, MD  ELIQUIS DVT/PE STARTER PACK  TAKE AS DIRECTED ON PACKAGE: START WITH TWO-5MG  TABLETS TWICE DAILY FOR 7 DAYS. ON DAY 8, SWITCH TO ONE-5MG  TABLET TWICE DAILY. Patient taking differently: Take 5-10 mg by mouth See admin instructions. TAKE AS DIRECTED ON PACKAGE: START WITH TWO-5MG  TABLETS (10 mg) TWICE DAILY FOR 7 DAYS. ON DAY 8, SWITCH TO ONE-5MG  TABLET TWICE DAILY. 01/13/21  Yes Janith Lima, MD  escitalopram (LEXAPRO) 20 MG tablet Take 1 tablet (20 mg total) by mouth daily. 09/19/20  Yes Janith Lima, MD  famotidine (PEPCID) 40 MG tablet TAKE 1 TABLET BY MOUTH EVERY DAY Patient taking differently: Take 40 mg by mouth in the morning. 01/04/21  Yes Janith Lima, MD  fluticasone (FLONASE) 50 MCG/ACT nasal spray SPRAY 2 SPRAYS INTO EACH NOSTRIL EVERY DAY Patient taking differently: Place 2 sprays into both nostrils daily. 06/10/20  Yes Raylene Everts,  MD  hydrALAZINE (APRESOLINE) 50 MG tablet Take 1.5 tablets (75 mg total) by mouth 3 (three) times daily. Patient taking differently: Take 50 mg by mouth 3 (three) times daily. 01/02/21  Yes Domenic Polite, MD  isosorbide mononitrate (IMDUR) 30 MG 24 hr tablet Take 1 tablet (30 mg total) by mouth daily. 01/03/21  Yes Domenic Polite, MD  lactose free nutrition (BOOST) LIQD Take 237 mLs by mouth 2 (two) times daily between meals.   Yes [provider]  levothyroxine (SYNTHROID) 50 MCG tablet Take 1 tablet (50 mcg total) by mouth daily before breakfast. 12/20/20  Yes Marrian Salvage, FNP  loperamide (IMODIUM A-D) 2 MG tablet Take 2 mg by mouth 4 (four) times daily as needed for diarrhea or loose stools.   Yes [provider]  LORazepam (ATIVAN) 1 MG tablet TAKE 1 TABLET BY MOUTH EVERY DAY Patient taking differently: Take 1 mg by mouth in the morning and at bedtime. 01/10/21  Yes Janith Lima, MD  metoprolol tartrate (LOPRESSOR) 50 MG tablet Take 1 tablet (50 mg total) by mouth 2 (two) times daily. 01/02/21  Yes Domenic Polite, MD  ondansetron (ZOFRAN ODT)  8 MG disintegrating tablet Take 1 tablet (8 mg total) by mouth every 8 (eight) hours as needed for nausea or vomiting. 01/13/21  Yes Marrian Salvage, FNP  rosuvastatin (CRESTOR) 20 MG tablet Take 1 tablet (20 mg total) by mouth daily. 09/19/20  Yes Janith Lima, MD  diphenoxylate-atropine (LOMOTIL) 2.5-0.025 MG tablet Take 1 tablet by mouth 4 (four) times daily as needed for diarrhea or loose stools. 09/19/20   Janith Lima, MD  Ensure (ENSURE) Take 237 mLs by mouth 2 (two) times daily.    [provider]  folic acid (FOLVITE) 1 MG tablet Take 1 tablet (1 mg total) by mouth daily. Patient not taking: No sig reported 09/19/20   Janith Lima, MD  thiamine (VITAMIN B-1) 100 MG tablet Take 1 tablet (100 mg total) by mouth daily. Patient not taking: Reported on 01/13/2021 09/19/20   Janith Lima, MD  levocetirizine (XYZAL) 5 MG tablet TAKE 1 TABLET(5 MG) BY MOUTH EVERY EVENING 02/21/20 06/10/20  Janith Lima, MD    Allergies    Xyzal [levocetirizine dihydrochloride], Augmentin [amoxicillin-pot clavulanate], and Tribenzor [olmesartan-amlodipine-hctz]  Review of Systems   Review of Systems  Constitutional: Negative for chills and fever.  Respiratory: Negative for cough and shortness of breath.   Cardiovascular: Negative for chest pain and palpitations.  Gastrointestinal: Negative for abdominal pain and vomiting.  Genitourinary: Negative for dysuria and hematuria.  Musculoskeletal: Negative for arthralgias and back pain.  Skin: Negative for color change and rash.  Neurological: Negative for syncope, light-headedness and headaches.  All other systems reviewed and are negative.   Physical Exam Updated Vital Signs BP (!) 138/57   Pulse 80   Temp 98.8 F (37.1 C) (Oral)   Resp 18   SpO2 96%   Physical Exam Constitutional:      General: She is not in acute distress. HENT:     Head: Normocephalic and atraumatic.  Eyes:     Conjunctiva/sclera: Conjunctivae normal.      Pupils: Pupils are equal, round, and reactive to light.  Neck:     Comments: Tracheostomy tube in place Cardiovascular:     Rate and Rhythm: Normal rate and regular rhythm.  Pulmonary:     Effort: Pulmonary effort is normal. No respiratory distress.  Abdominal:     General: There  is no distension.     Tenderness: There is no abdominal tenderness.  Skin:    General: Skin is warm and dry.  Neurological:     General: No focal deficit present.     Mental Status: She is alert. Mental status is at baseline.  Psychiatric:        Mood and Affect: Mood normal.        Behavior: Behavior normal.     ED Results / Procedures / Treatments   Labs (all labs ordered are listed, but only abnormal results are displayed) Labs Reviewed  BASIC METABOLIC PANEL - Abnormal; Notable for the following components:      Result Value   Sodium 132 (*)    Potassium >7.5 (*)    CO2 13 (*)    Glucose, Bld 134 (*)    BUN 46 (*)    Creatinine, Ser 3.79 (*)    GFR, Estimated 12 (*)    All other components within normal limits  CBC - Abnormal; Notable for the following components:   RBC 3.55 (*)    Hemoglobin 10.1 (*)    HCT 32.2 (*)    All other components within normal limits  URINALYSIS, ROUTINE W REFLEX MICROSCOPIC - Abnormal; Notable for the following components:   Color, Urine STRAW (*)    Protein, ur 30 (*)    Bacteria, UA RARE (*)    All other components within normal limits  POTASSIUM - Abnormal; Notable for the following components:   Potassium 6.5 (*)    All other components within normal limits  TROPONIN I (HIGH SENSITIVITY) - Abnormal; Notable for the following components:   Troponin I (High Sensitivity) 1,153 (*)    All other components within normal limits  TROPONIN I (HIGH SENSITIVITY) - Abnormal; Notable for the following components:   Troponin I (High Sensitivity) 937 (*)    All other components within normal limits  SARS CORONAVIRUS 2 BY RT PCR (HOSPITAL ORDER, [place] [facility])  MAGNESIUM  CREATININE, URINE, RANDOM  NA AND K (SODIUM & POTASSIUM), RAND UR  TSH  COMPREHENSIVE METABOLIC PANEL  MAGNESIUM  PHOSPHORUS  CBC  CALCIUM, IONIZED  RAPID URINE DRUG SCREEN, HOSP PERFORMED  RENAL FUNCTION PANEL  IRON AND TIBC  FERRITIN  POC SARS CORONAVIRUS 2 AG -  ED    EKG EKG Interpretation  Date/Time:  Monday January 13 2021 16:57:58 EST Ventricular Rate:  71 PR Interval:    QRS Duration: 115 QT Interval:  394 QTC Calculation: 429 R Axis:   30 Text Interpretation: Sinus rhythm Nonspecific intraventricular conduction delay Minimal ST depression, lateral leads No STEMI Confirmed by Octaviano Glow [phone]) on 01/13/2021 6:22:16 PM   Radiology No results found.  Procedures .Critical Care Performed by: Wyvonnia Dusky, MD Authorized by: Wyvonnia Dusky, MD   Critical care provider statement:    Critical care time (minutes):  45   Critical care was necessary to treat or prevent imminent or life-threatening deterioration of the following conditions:  Metabolic crisis   Critical care was time spent personally by me on the following activities:  Discussions with consultants, evaluation of patient's response to treatment, examination of patient, ordering and performing treatments and interventions, ordering and review of laboratory studies, ordering and review of radiographic studies, pulse oximetry, re-evaluation of patient's condition, obtaining history from patient or surrogate and review of old charts Comments:     Hyperkalemia treatment     Medications Ordered in ED Medications  sodium zirconium cyclosilicate (LOKELMA) packet 10 g (10 g Oral Given 01/13/21 1742)  sodium zirconium cyclosilicate (LOKELMA) packet 10 g (has no administration in time range)  hydrALAZINE (APRESOLINE) tablet 75 mg (has no administration in time range)  isosorbide mononitrate (IMDUR) 24 hr tablet 30 mg (has no administration in time range)  metoprolol  tartrate (LOPRESSOR) tablet 50 mg (50 mg Oral Given 01/13/21 2138)  rosuvastatin (CRESTOR) tablet 20 mg (has no administration in time range)  LORazepam (ATIVAN) tablet 1 mg (1 mg Oral Given 01/13/21 2138)  levothyroxine (SYNTHROID) tablet 50 mcg (has no administration in time range)  cilostazol (PLETAL) tablet 100 mg (100 mg Oral Given 01/13/21 2139)  fluticasone (FLONASE) 50 MCG/ACT nasal spray 2 spray (has no administration in time range)  acetaminophen (TYLENOL) tablet 650 mg (has no administration in time range)    Or  acetaminophen (TYLENOL) suppository 650 mg (has no administration in time range)  apixaban (ELIQUIS) tablet 5 mg (5 mg Oral Given 01/13/21 2139)  calcium chloride 1 g in sodium chloride 0.9 % 100 mL IVPB (0 g Intravenous Stopped 01/13/21 1811)  albuterol (PROVENTIL) (2.5 MG/3ML) 0.083% nebulizer solution 10 mg (10 mg Nebulization Given 01/13/21 1710)  insulin aspart (novoLOG) injection 5 Units (5 Units Intravenous Given 01/13/21 1733)    And  dextrose 50 % solution 50 mL (50 mLs Intravenous Given 01/13/21 1734)  hydrALAZINE (APRESOLINE) tablet 50 mg (50 mg Oral Given 01/13/21 1742)  sodium chloride 0.9 % bolus 1,000 mL (0 mLs Intravenous Stopped 01/13/21 2139)    ED Course  I have reviewed the triage vital signs and the nursing notes.  Pertinent labs & imaging results that were available during my care of the patient were reviewed by me and considered in my medical decision making (see chart for details).  75 yo Female here with hyperK in setting of acute on chronic kidney disease Labs reviewed - K > 7.5 on arrival, Cr doubled from 2 weeks ago.  She is not anuric.  No evidence of acute respiratory failure or volume overload.  ECG per my interpretation shows sinus rhythm without widened QRS or sinusoidal pattern, although T waves are peaked.  Medications ordered for hyperkalemia - calcium, albuterol, insulin, and lokelma Tele monitor reviewed and stable rhythm Troponins reviewed  and near recent baseline - no CP to suggest worsening ischemia  Prior medical records reviewed (former hospitalization) Hx supplemental from daughter  Stable for admission to tele Nephrology consulted as noted below  Clinical Course as of 01/14/21 0039  Mon Jan 13, 2021  1815 Trop flat from 3 days ago, still elevated in the setting of CKD< but given she has no active CP or SOB, doubt this is new acute worsening ischemia.  Nursing to redraw K and trop now that she's completed her hyperK therapy [MT]  2011 Repeat K now 6.5, trop downtrending, will add IVF, admit to hospitalist. [MT]  2016 I spoke to Dr Candiss Norse from nephrology who recommended Lokelma TID 10 g and he will leave further recommendations.  Patient stable for tele admission now - not in need of emergent dialysis. [MT]  2025 Paged for admission. [MT]  2029 Signed out to hospitalist [MT]    Clinical Course User Index [MT] Nekeisha Aure, Carola Rhine, MD    Final Clinical Impression(s) / ED Diagnoses Final diagnoses:  AKI (acute kidney injury) ([place])  Hyperkalemia    Rx / DC Orders ED Discharge Orders    None       Octaviano Glow  J, MD 01/14/21 7482    Wyvonnia Dusky, MD 01/27/21 [id number]

## 2021-01-14 ENCOUNTER — Inpatient Hospital Stay (HOSPITAL_COMMUNITY): Payer: Medicare Other

## 2021-01-14 DIAGNOSIS — E875 Hyperkalemia: Principal | ICD-10-CM

## 2021-01-14 LAB — RENAL FUNCTION PANEL
Albumin: 3.4 g/dL — ABNORMAL LOW (ref 3.5–5.0)
Albumin: 3.2 g/dL — ABNORMAL LOW (ref 3.5–5.0)
Anion gap: 9 (ref 5–15)
Anion gap: 15 (ref 5–15)
BUN: 43 mg/dL — ABNORMAL HIGH (ref 8–23)
BUN: 40 mg/dL — ABNORMAL HIGH (ref 8–23)
CO2: 10 mmol/L — ABNORMAL LOW (ref 22–32)
CO2: 13 mmol/L — ABNORMAL LOW (ref 22–32)
Calcium: 9.8 mg/dL (ref 8.9–10.3)
Calcium: 8.9 mg/dL (ref 8.9–10.3)
Chloride: 113 mmol/L — ABNORMAL HIGH (ref 98–111)
Chloride: 108 mmol/L (ref 98–111)
Creatinine, Ser: 3.41 mg/dL — ABNORMAL HIGH (ref 0.44–1.00)
Creatinine, Ser: 2.93 mg/dL — ABNORMAL HIGH (ref 0.44–1.00)
GFR, Estimated: 13 mL/min — ABNORMAL LOW (ref >60)
GFR, Estimated: 16 mL/min — ABNORMAL LOW (ref >60)
Glucose, Bld: 132 mg/dL — ABNORMAL HIGH (ref 70–99)
Glucose, Bld: 164 mg/dL — ABNORMAL HIGH (ref 70–99)
Phosphorus: 5.4 mg/dL — ABNORMAL HIGH (ref 2.5–4.6)
Phosphorus: 7.4 mg/dL — ABNORMAL HIGH (ref 2.5–4.6)
Potassium: 6.7 mmol/L (ref 3.5–5.1)
Potassium: 4.7 mmol/L (ref 3.5–5.1)
Sodium: 132 mmol/L — ABNORMAL LOW (ref 135–145)
Sodium: 136 mmol/L (ref 135–145)

## 2021-01-14 LAB — CBC
HCT: 27.9 % — ABNORMAL LOW (ref 36.0–46.0)
Hemoglobin: 9 g/dL — ABNORMAL LOW (ref 12.0–15.0)
MCH: 29.2 pg (ref 26.0–34.0)
MCHC: 32.3 g/dL (ref 30.0–36.0)
MCV: 90.6 fL (ref 80.0–100.0)
Platelets: 258 10*3/uL (ref 150–400)
RBC: 3.08 MIL/uL — ABNORMAL LOW (ref 3.87–5.11)
RDW: 13.9 % (ref 11.5–15.5)
WBC: 9.6 10*3/uL (ref 4.0–10.5)
nRBC: 0 % (ref 0.0–0.2)

## 2021-01-14 LAB — COMPREHENSIVE METABOLIC PANEL
ALT: 19 U/L (ref 0–44)
AST: 21 U/L (ref 15–41)
Albumin: 3.4 g/dL — ABNORMAL LOW (ref 3.5–5.0)
Alkaline Phosphatase: 42 U/L (ref 38–126)
Anion gap: 10 (ref 5–15)
BUN: 43 mg/dL — ABNORMAL HIGH (ref 8–23)
CO2: 12 mmol/L — ABNORMAL LOW (ref 22–32)
Calcium: 10 mg/dL (ref 8.9–10.3)
Chloride: 112 mmol/L — ABNORMAL HIGH (ref 98–111)
Creatinine, Ser: 3.28 mg/dL — ABNORMAL HIGH (ref 0.44–1.00)
GFR, Estimated: 14 mL/min — ABNORMAL LOW (ref >60)
Glucose, Bld: 115 mg/dL — ABNORMAL HIGH (ref 70–99)
Potassium: 5.9 mmol/L — ABNORMAL HIGH (ref 3.5–5.1)
Sodium: 134 mmol/L — ABNORMAL LOW (ref 135–145)
Total Bilirubin: 0.6 mg/dL (ref 0.3–1.2)
Total Protein: 6 g/dL — ABNORMAL LOW (ref 6.5–8.1)

## 2021-01-14 LAB — RAPID URINE DRUG SCREEN, HOSP PERFORMED
Amphetamines: NOT DETECTED
Barbiturates: NOT DETECTED
Benzodiazepines: NOT DETECTED
Cocaine: NOT DETECTED
Opiates: POSITIVE — AB
Tetrahydrocannabinol: NOT DETECTED

## 2021-01-14 LAB — IRON AND TIBC
Iron: 65 ug/dL (ref 28–170)
Saturation Ratios: 24 % (ref 10.4–31.8)
TIBC: 270 ug/dL (ref 250–450)
UIBC: 205 ug/dL

## 2021-01-14 LAB — TSH: TSH: 3.879 u[IU]/mL (ref 0.350–4.500)

## 2021-01-14 LAB — MAGNESIUM: Magnesium: 1.6 mg/dL — ABNORMAL LOW (ref 1.7–2.4)

## 2021-01-14 LAB — PHOSPHORUS: Phosphorus: 6 mg/dL — ABNORMAL HIGH (ref 2.5–4.6)

## 2021-01-14 LAB — NA AND K (SODIUM & POTASSIUM), RAND UR
Potassium Urine: 21 mmol/L
Sodium, Ur: 49 mmol/L

## 2021-01-14 LAB — FERRITIN: Ferritin: 199 ng/mL (ref 11–307)

## 2021-01-14 LAB — CREATININE, URINE, RANDOM: Creatinine, Urine: 38.36 mg/dL

## 2021-01-14 MED ORDER — ESCITALOPRAM OXALATE 10 MG PO TABS
20.0000 mg | ORAL_TABLET | Freq: Every day | ORAL | Status: DC
  Administered 2021-01-14 – 2021-01-16 (×3): 20 mg via ORAL
  Filled 2021-01-14 (×3): qty 2

## 2021-01-14 MED ORDER — MAGNESIUM SULFATE 2 GM/50ML IV SOLN
2.0000 g | Freq: Once | INTRAVENOUS | Status: AC
  Administered 2021-01-14: 2 g via INTRAVENOUS
  Filled 2021-01-14: qty 50

## 2021-01-14 MED ORDER — SODIUM CHLORIDE 0.9 % IV BOLUS
250.0000 mL | Freq: Once | INTRAVENOUS | Status: AC
  Administered 2021-01-14: 250 mL via INTRAVENOUS

## 2021-01-14 MED ORDER — FAMOTIDINE 20 MG PO TABS
20.0000 mg | ORAL_TABLET | Freq: Every morning | ORAL | Status: DC
  Administered 2021-01-15 – 2021-01-16 (×2): 20 mg via ORAL
  Filled 2021-01-14 (×2): qty 1

## 2021-01-14 MED ORDER — FAMOTIDINE 20 MG PO TABS
40.0000 mg | ORAL_TABLET | Freq: Every morning | ORAL | Status: DC
  Administered 2021-01-14: 40 mg via ORAL
  Filled 2021-01-14: qty 2

## 2021-01-14 MED ORDER — HYDRALAZINE HCL 20 MG/ML IJ SOLN
10.0000 mg | Freq: Four times a day (QID) | INTRAMUSCULAR | Status: DC | PRN
  Administered 2021-01-16: 10 mg via INTRAVENOUS
  Filled 2021-01-14: qty 1

## 2021-01-14 MED ORDER — SODIUM BICARBONATE 650 MG PO TABS
1300.0000 mg | ORAL_TABLET | Freq: Three times a day (TID) | ORAL | Status: DC
Start: 2021-01-14 — End: 2021-01-14
  Administered 2021-01-14 (×2): 1300 mg via ORAL
  Filled 2021-01-14 (×5): qty 2

## 2021-01-14 MED ORDER — FUROSEMIDE 10 MG/ML IJ SOLN
80.0000 mg | Freq: Once | INTRAMUSCULAR | Status: AC
  Administered 2021-01-14: 80 mg via INTRAVENOUS
  Filled 2021-01-14: qty 8

## 2021-01-14 MED ORDER — SODIUM BICARBONATE 8.4 % IV SOLN
INTRAVENOUS | Status: DC
  Filled 2021-01-14 (×5): qty 850

## 2021-01-14 NOTE — ED Notes (Signed)
Pt placed on pure wick at this time.  

## 2021-01-14 NOTE — Progress Notes (Signed)
Subjective:  Seen still in ER-  Eating lunch-  Daughter not here- NAD.  U/a pretty unremark-  30 prot, no cells.  U/S kidney sizes 9.5 to 10.8 cm-  Mult bilat cysts but also a solid lesion in the left kidney- rec non urgent MRI.  BP's are all over the place-  90 to 190 but now mostly low- no UOP recorded - renal function trending better last 24 hours, k down to 5.9 at last check- was also given both fluids and lasix overnight    Objective Vital signs in last 24 hours: Vitals:   01/14/21 0900 01/14/21 0915 01/14/21 1116 01/14/21 1212  BP: (!) 151/62 (!) 151/62  130/63  Pulse: 83 (!) 102 81 79  Resp: (!) 25  18 16   Temp:      TempSrc:      SpO2: 97%  98% 98%  Weight:      Height:       Weight change:  No intake or output data in the 24 hours ending 01/14/21 1431   Assessment/Recommendations:  75 y.o. female with uncontrolled HTN, CKD4, laryngeal Ca s/p laryngectomy, h/o recent NSTEMI presents with AKI and hyperkalemia.  AKI on CKD4:  -underlying CKD likely related to hypertensive arterionephrosclerosis (BL Cr around 1.7-1.9) -Suspecting her AKI is multifactorial: Prerenal injury in the setting of nausea/vomiting/diarrhea and possible "normotensive" AKI  no urgent indications for renal replacement therapy at this junction assuming we can keep K down -UA w/ sediment unremarkable - renal u/s- no obstruction-  Many simple cysts and concerning lesion left kidney -hydration: received NS bolus. But then got 80 of iv lasix ?  Does not appear wet to me   -Continue to hold RAAS blockers and HCTZ, avoid further nephrotoxins including NSAIDS, Morphine.  Unless absolutely necessary, avoid CT with contrast and/or MRI with gadolinium.     Hypertension: -uncontrolled,  resuming her home medications, really only hydralazine- will give parameters. I do suspect anxiety is playing a major role here  Hyperkalemia. Likely related to AKI. Repeat RFP, lokelma 10g TID. -Changed diet to renal diet-  Check  again this afternoon.  I am going to give bicarb in the form of IVF to correct metabolic acidosis and to help with K  Hyponatremia -Improved with normal saline.  Suspecting the cause of her hyponatremia is due to AKI  Anemia of chronic kidney disease -Hgb currently okay/within range -Transfuse for Hgb<7 g/dL -check iron panel    [name]    Labs: Basic Metabolic Panel: Recent Labs  Lab 01/13/21 1515 01/13/21 1905 01/14/21 0002 01/14/21 0311  NA 132*  --  132* 134*  K >7.5* 6.5* 6.7* 5.9*  CL 109  --  113* 112*  CO2 13*  --  10* 12*  GLUCOSE 134*  --  132* 115*  BUN 46*  --  43* 43*  CREATININE 3.79*  --  3.41* 3.28*  CALCIUM 9.6  --  9.8 10.0  PHOS  --   --  5.4* 6.0*   Liver Function Tests: Recent Labs  Lab 01/13/21 1037 01/14/21 0002 01/14/21 0311  AST 20  --  21  ALT 18  --  19  ALKPHOS 57  --  42  BILITOT 0.3  --  0.6  PROT 7.4  --  6.0*  ALBUMIN 4.4 3.4* 3.4*   No results for input(s): LIPASE, AMYLASE in the last 168 hours. No results for input(s): AMMONIA in the last 168 hours. CBC: Recent Labs  Lab 01/13/21  1515 01/14/21 0311  WBC 10.1 9.6  HGB 10.1* 9.0*  HCT 32.2* 27.9*  MCV 90.7 90.6  PLT 341 258   Cardiac Enzymes: No results for input(s): CKTOTAL, CKMB, CKMBINDEX, TROPONINI in the last 168 hours. CBG: No results for input(s): GLUCAP in the last 168 hours.  Iron Studies:  Recent Labs    01/14/21 0002  IRON 65  TIBC 270  FERRITIN 199   Studies/Results: US RENAL  Result Date: 01/14/2021 CLINICAL DATA:  Acute renal injury EXAM: RENAL / URINARY TRACT ULTRASOUND COMPLETE COMPARISON:  12/27/2020 FINDINGS: Right Kidney: Renal measurements: 9.5 x 4.7 x 5.1 cm. = volume: 118 mL. Multiple cysts are identified. The largest of these measures 2.7 cm in the upper pole. No mass lesion or hydronephrosis is noted. Left Kidney: Renal measurements: 10.8 x 4.8 x 4.9 cm. = volume: 133 mL. Multiple simple appearing cysts are noted similar  to that seen on the prior exam. Additionally there is a 2.3 cm solid appearing lesion in the midportion of the left kidney which was not well appreciated on the prior exam. Nonobstructing stone is noted as well. Bladder: Appears normal for degree of bladder distention. Other: None. IMPRESSION: Multiple bilateral simple cysts are noted. There is suspicion of a solid lesion within the midportion of the left kidney. This may represent a hemorrhagic cyst but further evaluation is recommended. Nonemergent noncontrast MRI would be helpful for further evaluation of this lesion given some discrepancy from recent ultrasound and CT examination. No evidence of obstruction. Small nonobstructing left renal stone is noted. Electronically Signed   By: Inez Catalina M.D.   On: 01/14/2021 01:59   Medications: Infusions:   Scheduled Medications: . apixaban  5 mg Oral BID  . cilostazol  100 mg Oral BID  . escitalopram  20 mg Oral Daily  . [START ON 01/15/2021] famotidine  20 mg Oral q AM  . fluticasone  2 spray Each Nare Daily  . hydrALAZINE  75 mg Oral TID  . isosorbide mononitrate  30 mg Oral Daily  . levothyroxine  50 mcg Oral QAC breakfast  . LORazepam  1 mg Oral BID  . metoprolol tartrate  50 mg Oral BID  . rosuvastatin  20 mg Oral Daily  . sodium bicarbonate  1,300 mg Oral TID  . sodium zirconium cyclosilicate  10 g Oral TID    have reviewed scheduled and prn medications.  Physical Exam: General: NAD- thin Heart:RRR Lungs: clear Abdomen: soft, non tender Extremities: no edema   01/14/2021,2:31 PM  LOS: 1 day

## 2021-01-14 NOTE — ED Notes (Signed)
SDU Breakfast order placed 

## 2021-01-14 NOTE — Progress Notes (Signed)
I received a page from the in-house pharmacist this evening regarding patient's updated serum potassium level of 4.7 in the context of scheduled Lokelma.  I asked that Lokelma be stopped this evening, with plan to reevaluate serum potassium level in the morning to guide ensuing plan regarding necessity of additional Lokelma.   Babs Bertin, DO Hospitalist

## 2021-01-14 NOTE — Progress Notes (Signed)
ANTICOAGULATION CONSULT NOTE - Initial Consult  Pharmacy Consult:  Eliquis Indication:  Recent DVT and history of Afib  Allergies  Allergen Reactions  . Xyzal [Levocetirizine Dihydrochloride] Itching  . Augmentin [Amoxicillin-Pot Clavulanate] Diarrhea and Nausea And Vomiting  . Tribenzor [Olmesartan-Amlodipine-Hctz] Other (See Comments)    "Hurt the kidneys"    Patient Measurements: Height: 5\' 4"  (162.6 cm) Weight: 55.8 kg (123 lb) IBW/kg (Calculated) : 54.7  Vital Signs: BP: 151/62 (02/01 0915) Pulse Rate: 81 (02/01 1116)  Labs: Recent Labs    01/13/21 1515 01/13/21 1905 01/14/21 0002 01/14/21 0311  HGB 10.1*  --   --  9.0*  HCT 32.2*  --   --  27.9*  PLT 341  --   --  258  CREATININE 3.79*  --  3.41* 3.28*  TROPONINIHS 1,153* 937*  --   --     Estimated Creatinine Clearance: 12.8 mL/min (A) (by C-G formula based on SCr of 3.28 mg/dL (H)).   Medical History: Past Medical History:  Diagnosis Date  . Anemia   . Anxiety    takes Ativan prn  . Blood transfusion without reported diagnosis 09/15/12   2 units Prbc's  . Broken ribs   . Chronic back pain   . CKD (chronic kidney disease), stage IV ([place])   . Constipation    related to pain meds  . COPD (chronic obstructive pulmonary disease) ([place]) 08/10/2012   denies  . Depression   . Gastrostomy in place [facility])    removed  . GERD (gastroesophageal reflux disease)    takes Zantac daily  . Headache(784.0)   . Hiatal hernia 08/10/2012  . History of radiation therapy 10/17/12-11/25/12   supraglottic larynx,high risk neck tumor bed 5880 cGy/28 sessions, high risk lymph node tumor bed 5600 cGy/20 sessions, mod risk lymph node tumor bed 5040 cGy/20 sessions  . Hypercholesteremia    takes Pravastatin daily  . Hypertension    takes Tribenzor and Atenolol daily  . Insomnia    takes Amitriptyline daily  . Nausea    takes Zofran prn  . PAD (peripheral artery disease) ([place])    noninvasive imaging in 2016  . Pneumonia    . 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 Shortness of breath dyspnea   . Stroke [facility]) 2011   denies. no residual  . Uterine cancer (HCC)      Assessment: 27 YOF presented with lab abnormalities (hyperkalemia and AoC AKI).  She has a history of Afib (CHADSVASC 6) and RLE DVT diagnosed on 12/20/20.  Pharmacy consulted to continue Eliquis from PTA.  Renal function improving; no bleeding documented.    Goal of Therapy:  Appropriate anticoagulation Monitor platelets by anticoagulation protocol: Yes   Plan:  Continue Eliquis 5mg  PO BID Pharmacy will sign off and monitor peripherally.  Thank you for the consult!  Tavonte Seybold D. Mina Marble, PharmD, BCPS, [place] 01/14/2021, 11:35 AM

## 2021-01-14 NOTE — ED Notes (Signed)
Dinner Tray Ordered @ 1651. 

## 2021-01-14 NOTE — Progress Notes (Addendum)
PROGRESS NOTE    [name]  HBZ:[id number] DOB: May 27, 1946 DOA: 01/13/2021 PCP: Janith Lima, MD   Brief Narrative:  [name] is a 75 y.o. female with medical history significant for recent hospitalization for NSTEMI (12/26/2020 - 01/02/2021), peripheral artery disease, paroxysmal atrial fibrillation chronically anticoagulated on Eliquis, hypertension, hyperlipidemia, stage IV chronic kidney disease with baseline creatinine 1.7-1.9, acquired hypothyroidism, laryngeal cancer status post laryngectomy in 2013 and s/p chemo/radiation, who was sent to ED due to abnormal labs such as elevated creatinine and hyperkalemia.  In short, patient was admitted with AKI on CKD stage IV and hyperkalemia.  Details as below for interested folks.  The patient was recently hospitalized at [facility] from 12/26/2020 to 01/02/2021 for NSTEMI.  Presenting with an episode of diaphoresis, with high-sensitivity troponin high found to peak at 1567.  Cardiology was consulted, and found that the patient was unable to tolerate the recommended stress test as she became anxious with systolic blood pressures rising to greater than 200.  Additionally, left-sided heart cath was considered, but following discussion with patient regarding risk versus benefits in the setting of her history of chronic kidney disease, the decision was made by the patient as well as her family to refuse left-sided heart cath and proceed with medical management.   Additionally, during his previous hospitalization, hospital course was completed AKI superimposed on stage IV chronic kidney disease, with creatinine rising to 2.3 relative to her baseline of 1.7-1.9.  With improved antihypertensive control, creatinine trended back down, and was found to be within her baseline range of 1.9 at the time of discharge.   In the interval since discharge from the hospital 01/02/2021, the patient's PCP has been following her renal function, with  repeat BMP on 01/07/2021 showing creatinine of 2.70 relative to creatinine to 1.92 on 01/02/2021.  BMP was repeated again as an outpatient earlier today, with interval increase in creatinine to 3.76, and serum potassium of 7.5, prompting PCP to recommend the patient present to the emergency department for further evaluation and management thereof.  At the time of presentation to [facility] emergency department, the the patient is without acute complaint.  She was hemodynamically stable upon presentation. BMP showed potassium 7.5, creatinine 3.79.   EKG showed sinus rhythm with heart rate 71, normal intervals, nonspecific T wave inversion in aVL and V1, of which to inversion aVL was also noted on most recent prior EKG, and showed no evidence of ST changes, including no evidence of ST elevation.  Patient was admitted under hospital service and nephrology consulted.  While in the ED, the following were administered: Albuterol nebulizer treatment x1, NovoLog 5 units IV x1, 1 amp of D50, Lokelma 10 g p.o. x1 followed by ordering of Lokelma , 10 g p.o. 3 times daily, with next dose scheduled to occur at 2200 this evening, hydralazine 50 mg p.o. x1, calcium chloride 1 g IV x1, and 1 L normal saline bolus.   Assessment & Plan:   Principal Problem:   Hyperkalemia Active Problems:   Hypertension   AKI (acute kidney injury) ([place])   Other specified hypothyroidism   HLD (hyperlipidemia)   Hyperkalemia: presenting serum potassium of 7.5 in the setting of acute kidney injury superimposed on stage IV chronic kidney disease, in the absence of associated EKG changes.  Received multiple treatments in the ED.  Potassium improved but is still hyperkalemic/5.9.  Nephrology managing.  Acute kidney injury superimposed on stage IV chronic kidney disease: In the setting  of a documented history of intraparenchymal disease with associated baseline creatinine of 1.7-1.9, the patient's creatinine has been noted to be  trending up following discharge from recent hospitalization on 01/02/2021, with value of 2.7 on 01/07/2021 followed by today's value of 3.76.  Suspect potential contribution from hypertensive nephropathy.  Some improvement in creatinine compared to yesterday.  Nephrology on board and management per them.  Avoid nephrotoxic agents.    Metabolic acidosis: CO2 only 12.  In the setting of acute kidney injury.  Management per nephrology.  Hypertensive urgency in a patient with essential hypertension: Blood pressure much better now.  Continue home dose of Imdur, metoprolol and hydralazine.  Add as needed IV hydralazine  Recent NSTEMI: Recent hospitalization is for NSTEMI, as further described above.  Unable to tolerate stress test during that hospitalization and patient and family elected to forego cardiac catheterization as well.  Continue rosuvastatin, metoprolol tartrate, Eliquis.  Hyperlipidemia: Continue rosuvastatin.  Acquired hypothyroidism: Continue Synthroid.  Hypomagnesemia: 1.6.  Replace with magnesium sulfate.  Recheck in the morning.  DVT prophylaxis:    Code Status: Full Code  Family Communication:  None present at bedside.  Plan of care discussed with patient in length and he verbalized understanding and agreed with it.  Status is: Inpatient  Remains inpatient appropriate because:Inpatient level of care appropriate due to severity of illness   Dispo: The patient is from: Home              Anticipated d/c is to: Home              Anticipated d/c date is: 2 days              Patient currently is not medically stable to d/c.   Difficult to place patient No        Estimated body mass index is 21.11 kg/m as calculated from the following:   Height as of this encounter: 5\' 4"  (1.626 m).   Weight as of this encounter: 55.8 kg.      Nutritional status:               Consultants:   Nephrology  Procedures:   None  Antimicrobials:  Anti-infectives  (From admission, onward)   None         Subjective: Patient seen and examined in the ED.  She has no complaints.  Objective: Vitals:   01/14/21 0800 01/14/21 0842 01/14/21 0900 01/14/21 0915  BP: 115/61  (!) 151/62 (!) 151/62  Pulse: 85  83 (!) 102  Resp: 19  (!) 25   Temp:      TempSrc:      SpO2: 96%  97%   Weight:  55.8 kg    Height:  5\' 4"  (1.626 m)     No intake or output data in the 24 hours ending 01/14/21 1018 Filed Weights   01/14/21 0842  Weight: 55.8 kg    Examination:  General exam: Appears calm and comfortable  Respiratory system: Clear to auscultation. Respiratory effort normal. Cardiovascular system: S1 & S2 heard, RRR. No JVD, murmurs, rubs, gallops or clicks. No pedal edema. Gastrointestinal system: Abdomen is nondistended, soft and nontender. No organomegaly or masses felt. Normal bowel sounds heard. Central nervous system: Alert and oriented. No focal neurological deficits. Extremities: Symmetric 5 x 5 power. Skin: No rashes, lesions or ulcers Psychiatry: Judgement and insight appear normal. Mood & affect appropriate.    Data Reviewed: I have personally reviewed following labs and imaging studies  CBC: Recent Labs  Lab 01/07/21 1037 01/13/21 1515 01/14/21 0311  WBC 10.0 10.1 9.6  NEUTROABS 7.3  --   --   HGB 9.8* 10.1* 9.0*  HCT 30.8* 32.2* 27.9*  MCV 88.5 90.7 90.6  PLT 297 341 203   Basic Metabolic Panel: Recent Labs  Lab 01/07/21 1037 01/13/21 1037 01/13/21 1515 01/13/21 1905 01/14/21 0002 01/14/21 0311  NA 140 129* 132*  --  132* 134*  K 5.1 7.1 No hemolysis seen* >7.5* 6.5* 6.7* 5.9*  CL 112* 108 109  --  113* 112*  CO2 17* 12* 13*  --  10* 12*  GLUCOSE 114* 96 134*  --  132* 115*  BUN 33* 44* 46*  --  43* 43*  CREATININE 2.70* 3.76* 3.79*  --  3.41* 3.28*  CALCIUM 9.8 9.8 9.6  --  9.8 10.0  MG  --   --  1.7  --   --  1.6*  PHOS  --   --   --   --  5.4* 6.0*   GFR: Estimated Creatinine Clearance: 12.8 mL/min (A) (by  C-G formula based on SCr of 3.28 mg/dL (H)). Liver Function Tests: Recent Labs  Lab 01/07/21 1037 01/13/21 1037 01/14/21 0002 01/14/21 0311  AST 15 20  --  21  ALT 21 18  --  19  ALKPHOS 63 57  --  42  BILITOT 0.3 0.3  --  0.6  PROT 7.6 7.4  --  6.0*  ALBUMIN 3.9 4.4 3.4* 3.4*   No results for input(s): LIPASE, AMYLASE in the last 168 hours. No results for input(s): AMMONIA in the last 168 hours. Coagulation Profile: No results for input(s): INR, PROTIME in the last 168 hours. Cardiac Enzymes: No results for input(s): CKTOTAL, CKMB, CKMBINDEX, TROPONINI in the last 168 hours. BNP (last 3 results) No results for input(s): PROBNP in the last 8760 hours. HbA1C: No results for input(s): HGBA1C in the last 72 hours. CBG: No results for input(s): GLUCAP in the last 168 hours. Lipid Profile: No results for input(s): CHOL, HDL, LDLCALC, TRIG, CHOLHDL, LDLDIRECT in the last 72 hours. Thyroid Function Tests: Recent Labs    01/14/21 0311  TSH 3.879   Anemia Panel: Recent Labs    01/13/21 1037 01/14/21 0002  VITAMINB12 983*  --   FERRITIN  --  199  TIBC  --  270  IRON  --  65   Sepsis Labs: No results for input(s): PROCALCITON, LATICACIDVEN in the last 168 hours.  Recent Results (from the past 240 hour(s))  SARS Coronavirus 2 by RT PCR (hospital order, performed in [facility] hospital lab) Nasopharyngeal Nasopharyngeal Swab     Status: None   Collection Time: 01/13/21  8:27 PM   Specimen: Nasopharyngeal Swab  Result Value Ref Range Status   SARS Coronavirus 2 NEGATIVE NEGATIVE Final    Comment: (NOTE) SARS-CoV-2 target nucleic acids are NOT DETECTED.  The SARS-CoV-2 RNA is generally detectable in upper and lower respiratory specimens during the acute phase of infection. The lowest concentration of SARS-CoV-2 viral copies this assay can detect is 250 copies / mL. A negative result does not preclude SARS-CoV-2 infection and should not be used as the sole basis for  treatment or other patient management decisions.  A negative result may occur with improper specimen collection / handling, submission of specimen other than nasopharyngeal swab, presence of viral mutation(s) within the areas targeted by this assay, and inadequate number of viral copies (<250 copies / mL).  A negative result must be combined with clinical observations, patient history, and epidemiological information.  Fact Sheet for Patients:   StrictlyIdeas.no  Fact Sheet for Healthcare Providers: BankingDealers.co.za  This test is not yet approved or  cleared by the [place] FDA and has been authorized for detection and/or diagnosis of SARS-CoV-2 by FDA under an Emergency Use Authorization (EUA).  This EUA will remain in effect (meaning this test can be used) for the duration of the COVID-19 declaration under Section 564(b)(1) of the Act, 21 U.S.C. section 360bbb-3(b)(1), unless the authorization is terminated or revoked sooner.  Performed at [facility], [place] [address] [place], [place] [address]       Radiology Studies: US RENAL  Result Date: 01/14/2021 CLINICAL DATA:  Acute renal injury EXAM: RENAL / URINARY TRACT ULTRASOUND COMPLETE COMPARISON:  12/27/2020 FINDINGS: Right Kidney: Renal measurements: 9.5 x 4.7 x 5.1 cm. = volume: 118 mL. Multiple cysts are identified. The largest of these measures 2.7 cm in the upper pole. No mass lesion or hydronephrosis is noted. Left Kidney: Renal measurements: 10.8 x 4.8 x 4.9 cm. = volume: 133 mL. Multiple simple appearing cysts are noted similar to that seen on the prior exam. Additionally there is a 2.3 cm solid appearing lesion in the midportion of the left kidney which was not well appreciated on the prior exam. Nonobstructing stone is noted as well. Bladder: Appears normal for degree of bladder distention. Other: None. IMPRESSION: Multiple bilateral simple cysts are noted. There is  suspicion of a solid lesion within the midportion of the left kidney. This may represent a hemorrhagic cyst but further evaluation is recommended. Nonemergent noncontrast MRI would be helpful for further evaluation of this lesion given some discrepancy from recent ultrasound and CT examination. No evidence of obstruction. Small nonobstructing left renal stone is noted. Electronically Signed   By: Inez Catalina M.D.   On: 01/14/2021 01:59    Scheduled Meds: . apixaban  5 mg Oral BID  . cilostazol  100 mg Oral BID  . fluticasone  2 spray Each Nare Daily  . hydrALAZINE  75 mg Oral TID  . isosorbide mononitrate  30 mg Oral Daily  . levothyroxine  50 mcg Oral QAC breakfast  . LORazepam  1 mg Oral BID  . metoprolol tartrate  50 mg Oral BID  . rosuvastatin  20 mg Oral Daily  . sodium bicarbonate  1,300 mg Oral TID  . sodium zirconium cyclosilicate  10 g Oral Daily  . sodium zirconium cyclosilicate  10 g Oral TID   Continuous Infusions:   LOS: 1 day   Time spent: 37 minutes   Darliss Cheney, MD [facility]  01/14/2021, 10:18 AM   To contact the attending provider between 7A-7P or the covering provider during after hours 7P-7A, please log into the web site [email or web address].

## 2021-01-14 NOTE — ED Notes (Signed)
Lunch Tray Ordered @ 1034. 

## 2021-01-14 NOTE — Progress Notes (Signed)
Updated labs reviewed. K 6.7. 250cc of NS w/ Lasix 80mg  IV x 1 dose ordered to help with K excretion. NaHCO3 1300mg  tid ordered. If bicarb lower, recommend checking VBG, if pH <7.2 then would need bicarb infusion.  Gean Quint, MD [facility]

## 2021-01-15 ENCOUNTER — Inpatient Hospital Stay (HOSPITAL_COMMUNITY): Payer: Medicare Other

## 2021-01-15 DIAGNOSIS — R079 Chest pain, unspecified: Secondary | ICD-10-CM

## 2021-01-15 LAB — CBC WITH DIFFERENTIAL/PLATELET
Abs Immature Granulocytes: 0.09 10*3/uL — ABNORMAL HIGH (ref 0.00–0.07)
Basophils Absolute: 0.1 10*3/uL (ref 0.0–0.1)
Basophils Relative: 1 %
Eosinophils Absolute: 0.3 10*3/uL (ref 0.0–0.5)
Eosinophils Relative: 3 %
HCT: 26.3 % — ABNORMAL LOW (ref 36.0–46.0)
Hemoglobin: 8.5 g/dL — ABNORMAL LOW (ref 12.0–15.0)
Immature Granulocytes: 1 %
Lymphocytes Relative: 23 %
Lymphs Abs: 2.1 10*3/uL (ref 0.7–4.0)
MCH: 28.2 pg (ref 26.0–34.0)
MCHC: 32.3 g/dL (ref 30.0–36.0)
MCV: 87.4 fL (ref 80.0–100.0)
Monocytes Absolute: 0.7 10*3/uL (ref 0.1–1.0)
Monocytes Relative: 8 %
Neutro Abs: 5.9 10*3/uL (ref 1.7–7.7)
Neutrophils Relative %: 64 %
Platelets: 241 10*3/uL (ref 150–400)
RBC: 3.01 MIL/uL — ABNORMAL LOW (ref 3.87–5.11)
RDW: 13.8 % (ref 11.5–15.5)
WBC: 9.1 10*3/uL (ref 4.0–10.5)
nRBC: 0 % (ref 0.0–0.2)

## 2021-01-15 LAB — TROPONIN I (HIGH SENSITIVITY)
Troponin I (High Sensitivity): 613 ng/L (ref <18)
Troponin I (High Sensitivity): 584 ng/L (ref <18)

## 2021-01-15 LAB — BASIC METABOLIC PANEL
Anion gap: 14 (ref 5–15)
BUN: 46 mg/dL — ABNORMAL HIGH (ref 8–23)
CO2: 20 mmol/L — ABNORMAL LOW (ref 22–32)
Calcium: 9.5 mg/dL (ref 8.9–10.3)
Chloride: 103 mmol/L (ref 98–111)
Creatinine, Ser: 2.91 mg/dL — ABNORMAL HIGH (ref 0.44–1.00)
GFR, Estimated: 16 mL/min — ABNORMAL LOW (ref >60)
Glucose, Bld: 115 mg/dL — ABNORMAL HIGH (ref 70–99)
Potassium: 3.8 mmol/L (ref 3.5–5.1)
Sodium: 137 mmol/L (ref 135–145)

## 2021-01-15 LAB — ECHOCARDIOGRAM COMPLETE
Area-P 1/2: 2.83 cm2
S' Lateral: 1.8 cm

## 2021-01-15 LAB — CALCIUM, IONIZED: Calcium, Ionized, Serum: 5.5 mg/dL (ref 4.5–5.6)

## 2021-01-15 LAB — MAGNESIUM: Magnesium: 1.9 mg/dL (ref 1.7–2.4)

## 2021-01-15 MED ORDER — ORAL CARE MOUTH RINSE
15.0000 mL | Freq: Two times a day (BID) | OROMUCOSAL | Status: DC
  Administered 2021-01-15: 15 mL via OROMUCOSAL

## 2021-01-15 MED ORDER — LORAZEPAM 2 MG/ML IJ SOLN
1.0000 mg | Freq: Once | INTRAMUSCULAR | Status: AC
  Administered 2021-01-16: 1 mg via INTRAVENOUS
  Filled 2021-01-15: qty 1

## 2021-01-15 MED ORDER — HYDRALAZINE HCL 25 MG PO TABS
25.0000 mg | ORAL_TABLET | Freq: Three times a day (TID) | ORAL | Status: DC
Start: 2021-01-15 — End: 2021-01-16
  Administered 2021-01-15 – 2021-01-16 (×4): 25 mg via ORAL
  Filled 2021-01-15 (×5): qty 1

## 2021-01-15 NOTE — Progress Notes (Signed)
Subjective:  Now admitted to [facility]-  No UOP recorded but she says is making-  crt improved from 3.4 to 2.9.  BP today probably lower than what I would want at 937-902 systolic - she endorses dizziness   Objective Vital signs in last 24 hours: Vitals:   01/15/21 0400 01/15/21 0625 01/15/21 0802 01/15/21 0900  BP:  108/66 (!) 131/58 116/87  Pulse: 70 75 93 (!) 133  Resp: 17 15 20  (!) 27  Temp:  98.4 F (36.9 C) 98.2 F (36.8 C)   TempSrc:  Oral Oral   SpO2: 98% 97% 98% 100%  Weight:      Height:       Weight change:   Intake/Output Summary (Last 24 hours) at 01/15/2021 1123 Last data filed at 01/15/2021 0440 Gross per 24 hour  Intake 1011.68 ml  Output --  Net 1011.68 ml     Assessment/Recommendations:  75 y.o. female with uncontrolled HTN, CKD4, laryngeal Ca s/p laryngectomy, h/o recent NSTEMI presents with AKI and hyperkalemia.  AKI on CKD4:  -underlying CKD likely related to hypertensive arterionephrosclerosis (BL Cr around 1.7-1.9) -Suspecting her AKI is multifactorial: Prerenal injury in the setting of nausea/vomiting/diarrhea and possible "normotensive" AKI   no urgent indications for renal replacement therapy  -UA w/ sediment unremarkable - renal u/s- no obstruction-  Many simple cysts and concerning lesion left kidney- see plan below   Does not appear wet to me     Hypertension: -uncontrolled,  resuming her home medications, metoprolol, imdur really and hydralazine- will decrease dose and keep parameters- aim for SBP of 130-150. I do suspect anxiety is playing a major role here  Hyperkalemia. resolved  Hyponatremia -Improved with normal saline.  Suspecting the cause of her hyponatremia is due to AKI  Anemia of chronic kidney disease -Hgb currently okay/within range -Transfuse for Hgb<7 g/dL -check iron panel  Possible renal mass-  rec non emergent MRI-  Will go ahead and get while is here-  Will need more stability of renal function and BP prior to discharge    Metabolic acidosis-  Cont bicarb for at least another 24 hours     Louis Meckel    Labs: Basic Metabolic Panel: Recent Labs  Lab 01/14/21 0002 01/14/21 0311 01/14/21 1752 01/15/21 0323  NA 132* 134* 136 137  K 6.7* 5.9* 4.7 3.8  CL 113* 112* 108 103  CO2 10* 12* 13* 20*  GLUCOSE 132* 115* 164* 115*  BUN 43* 43* 40* 46*  CREATININE 3.41* 3.28* 2.93* 2.91*  CALCIUM 9.8 10.0 8.9 9.5  PHOS 5.4* 6.0* 7.4*  --    Liver Function Tests: Recent Labs  Lab 01/13/21 1037 01/14/21 0002 01/14/21 0311 01/14/21 1752  AST 20  --  21  --   ALT 18  --  19  --   ALKPHOS 57  --  42  --   BILITOT 0.3  --  0.6  --   PROT 7.4  --  6.0*  --   ALBUMIN 4.4 3.4* 3.4* 3.2*   No results for input(s): LIPASE, AMYLASE in the last 168 hours. No results for input(s): AMMONIA in the last 168 hours. CBC: Recent Labs  Lab 01/13/21 1515 01/14/21 0311 01/15/21 0323  WBC 10.1 9.6 9.1  NEUTROABS  --   --  5.9  HGB 10.1* 9.0* 8.5*  HCT 32.2* 27.9* 26.3*  MCV 90.7 90.6 87.4  PLT 341 258 241   Cardiac Enzymes: No results for input(s): CKTOTAL, CKMB, CKMBINDEX,  TROPONINI in the last 168 hours. CBG: No results for input(s): GLUCAP in the last 168 hours.  Iron Studies:  Recent Labs    01/14/21 0002  IRON 65  TIBC 270  FERRITIN 199   Studies/Results: US RENAL  Result Date: 01/14/2021 CLINICAL DATA:  Acute renal injury EXAM: RENAL / URINARY TRACT ULTRASOUND COMPLETE COMPARISON:  12/27/2020 FINDINGS: Right Kidney: Renal measurements: 9.5 x 4.7 x 5.1 cm. = volume: 118 mL. Multiple cysts are identified. The largest of these measures 2.7 cm in the upper pole. No mass lesion or hydronephrosis is noted. Left Kidney: Renal measurements: 10.8 x 4.8 x 4.9 cm. = volume: 133 mL. Multiple simple appearing cysts are noted similar to that seen on the prior exam. Additionally there is a 2.3 cm solid appearing lesion in the midportion of the left kidney which was not well appreciated on the prior  exam. Nonobstructing stone is noted as well. Bladder: Appears normal for degree of bladder distention. Other: None. IMPRESSION: Multiple bilateral simple cysts are noted. There is suspicion of a solid lesion within the midportion of the left kidney. This may represent a hemorrhagic cyst but further evaluation is recommended. Nonemergent noncontrast MRI would be helpful for further evaluation of this lesion given some discrepancy from recent ultrasound and CT examination. No evidence of obstruction. Small nonobstructing left renal stone is noted. Electronically Signed   By: Inez Catalina M.D.   On: 01/14/2021 01:59   Medications: Infusions: . sodium bicarbonate (isotonic) 150 mEq in D5W 1000 mL infusion 75 mL/hr at 01/15/21 0951    Scheduled Medications: . apixaban  5 mg Oral BID  . cilostazol  100 mg Oral BID  . escitalopram  20 mg Oral Daily  . famotidine  20 mg Oral q AM  . fluticasone  2 spray Each Nare Daily  . hydrALAZINE  75 mg Oral TID  . isosorbide mononitrate  30 mg Oral Daily  . levothyroxine  50 mcg Oral QAC breakfast  . LORazepam  1 mg Oral BID  . mouth rinse  15 mL Mouth Rinse BID  . metoprolol tartrate  50 mg Oral BID  . rosuvastatin  20 mg Oral Daily    have reviewed scheduled and prn medications.  Physical Exam: General: NAD- thin-  Daughter here at bedside, she is happy that kidney function is improved  Heart:RRR Lungs: clear Abdomen: soft, non tender Extremities: no edema   01/15/2021,11:23 AM  LOS: 2 days

## 2021-01-15 NOTE — Evaluation (Signed)
Physical Therapy Evaluation Patient Details Name: [name] MRN: [id number] DOB: Jun 24, 1946 Today's Date: 01/15/2021   History of Present Illness  75 y.o. female with medical history significant for recent hospitalization for NSTEMI (12/26/2020 - 01/02/2021), peripheral artery disease, paroxysmal atrial fibrillation chronically anticoagulated on Eliquis, hypertension, hyperlipidemia, stage IV chronic kidney disease with baseline creatinine 1.7-1.9, acquired hypothyroidism, laryngeal cancer status post laryngectomy in 2013 and s/p chemo/radiation, who was sent to ED due to abnormal labs such as elevated creatinine and hyperkalemia.  Clinical Impression  Pt presents to PT with deficits in activity tolerance, gait, balance. Pt is limited by bilateral LE pain, which she describes as a cramping/squeezing sensation. Pt's calves are not tender to touch at rest, no significant warmth or redness noted. Pt ambulates well without physical assistance requirements. Pt will benefit from continued acute PT services and aggressive mobilization to gradually improve activity tolerance. PT recommends discharge home with no PT or DME needs. PT discussed the need for a gradual increase in activity tolerance as well as energy conservation techniques with the pt's daughter during this session.    Follow Up Recommendations No PT follow up;Supervision - Intermittent    Equipment Recommendations  None recommended by PT    Recommendations for Other Services       Precautions / Restrictions Precautions Precautions: Fall Restrictions Weight Bearing Restrictions: No      Mobility  Bed Mobility Overal bed mobility: Modified Independent             General bed mobility comments: HOB elevated    Transfers Overall transfer level: Needs assistance Equipment used: None Transfers: Sit to/from Stand Sit to Stand: Supervision            Ambulation/Gait Ambulation/Gait assistance: Supervision Gait  Distance (Feet): 150 Feet Assistive device: None Gait Pattern/deviations: Step-through pattern Gait velocity: functional Gait velocity interpretation: 1.31 - 2.62 ft/sec, indicative of limited community ambulator General Gait Details: pt with step-through gait, initially increased lateral drift but this improves with continued ambulation. Pt reports bilateral calf pain ~75' into ambulation.  Stairs            Wheelchair Mobility    Modified Rankin (Stroke Patients Only)       Balance Overall balance assessment: Mild deficits observed, not formally tested                                           Pertinent Vitals/Pain Pain Assessment: 0-10 Pain Score: 10-Worst pain ever (decreased with resting) Pain Location: calves Pain Descriptors / Indicators: Cramping Pain Intervention(s): Monitored during session    Home Living Family/patient expects to be discharged to:: Private residence Living Arrangements: Children Available Help at Discharge: Family;Available PRN/intermittently Type of Home: House Home Access: Stairs to enter Entrance Stairs-Rails: Right Entrance Stairs-Number of Steps: 3 Home Layout: One level Home Equipment: Shower seat      Prior Function Level of Independence: Independent               Hand Dominance   Dominant Hand: Right    Extremity/Trunk Assessment   Upper Extremity Assessment Upper Extremity Assessment: Overall WFL for tasks assessed    Lower Extremity Assessment Lower Extremity Assessment: Overall WFL for tasks assessed    Cervical / Trunk Assessment Cervical / Trunk Assessment: Normal  Communication   Communication: Prefers language other than English (pt calls daughter to interpret via video  chat, difficulty utilizing hospital video interpreters as the pt has soft voice quality since total laryngectomy. Pt's daughter is able to read her lips.)  Cognition Arousal/Alertness: Awake/alert Behavior During  Therapy: WFL for tasks assessed/performed Overall Cognitive Status: Within Functional Limits for tasks assessed                                        General Comments General comments (skin integrity, edema, etc.): VSS on RA    Exercises     Assessment/Plan    PT Assessment Patient needs continued PT services  PT Problem List Decreased activity tolerance;Decreased balance;Decreased mobility;Cardiopulmonary status limiting activity;Pain       PT Treatment Interventions Gait training;Therapeutic exercise;Therapeutic activities;Balance training;Neuromuscular re-education;Patient/family education    PT Goals (Current goals can be found in the Care Plan section)  Acute Rehab PT Goals Patient Stated Goal: to return to her prior level of activity tolerance PT Goal Formulation: With patient/family Time For Goal Achievement: 01/29/21 Potential to Achieve Goals: Good Additional Goals Additional Goal #1: Pt will report 3/10 LE pain or less when ambulating over 150'.    Frequency Min 3X/week   Barriers to discharge        Co-evaluation               AM-PAC PT "6 Clicks" Mobility  Outcome Measure Help needed turning from your back to your side while in a flat bed without using bedrails?: None Help needed moving from lying on your back to sitting on the side of a flat bed without using bedrails?: None Help needed moving to and from a bed to a chair (including a wheelchair)?: A Little Help needed standing up from a chair using your arms (e.g., wheelchair or bedside chair)?: A Little Help needed to walk in hospital room?: A Little Help needed climbing 3-5 steps with a railing? : A Little 6 Click Score: 20    End of Session   Activity Tolerance: Patient tolerated treatment well Patient left: in bed;with call bell/phone within reach;with bed alarm set Nurse Communication: Mobility status PT Visit Diagnosis: Pain;Other abnormalities of gait and mobility  (R26.89) Pain - Right/Left:  (bilateral, R>L) Pain - part of body: Leg    Time: 1310-1336 PT Time Calculation (min) (ACUTE ONLY): 26 min   Charges:   PT Evaluation $PT Eval Low Complexity: [place], PT, DPT Acute Rehabilitation Pager: [phone]   Zenaida Niece 01/15/2021, 1:51 PM

## 2021-01-15 NOTE — Progress Notes (Addendum)
PROGRESS NOTE    [name]  OYD:[id number] DOB: September 21, 1946 DOA: 01/13/2021 PCP: Janith Lima, MD   Brief Narrative:  [name] is a 75 y.o. female with medical history significant for recent hospitalization for NSTEMI (12/26/2020 - 01/02/2021), peripheral artery disease, paroxysmal atrial fibrillation chronically anticoagulated on Eliquis, hypertension, hyperlipidemia, stage IV chronic kidney disease with baseline creatinine 1.7-1.9, acquired hypothyroidism, laryngeal cancer status post laryngectomy in 2013 and s/p chemo/radiation, who was sent to ED due to abnormal labs such as elevated creatinine and hyperkalemia.  In short, patient was admitted with AKI on CKD stage IV and hyperkalemia.  Details as below for interested folks.  The patient was recently hospitalized at [facility] from 12/26/2020 to 01/02/2021 for NSTEMI.  Presenting with an episode of diaphoresis, with high-sensitivity troponin high found to peak at 1567.  Cardiology was consulted, and found that the patient was unable to tolerate the recommended stress test as she became anxious with systolic blood pressures rising to greater than 200.  Additionally, left-sided heart cath was considered, but following discussion with patient regarding risk versus benefits in the setting of her history of chronic kidney disease, the decision was made by the patient as well as her family to refuse left-sided heart cath and proceed with medical management.   Additionally, during his previous hospitalization, hospital course was completed AKI superimposed on stage IV chronic kidney disease, with creatinine rising to 2.3 relative to her baseline of 1.7-1.9.  With improved antihypertensive control, creatinine trended back down, and was found to be within her baseline range of 1.9 at the time of discharge.   In the interval since discharge from the hospital 01/02/2021, the patient's PCP has been following her renal function, with  repeat BMP on 01/07/2021 showing creatinine of 2.70 relative to creatinine to 1.92 on 01/02/2021.  BMP was repeated again as an outpatient earlier today, with interval increase in creatinine to 3.76, and serum potassium of 7.5, prompting PCP to recommend the patient present to the emergency department for further evaluation and management thereof.  At the time of presentation to [facility] emergency department, the the patient is without acute complaint.  She was hemodynamically stable upon presentation. BMP showed potassium 7.5, creatinine 3.79.   EKG showed sinus rhythm with heart rate 71, normal intervals, nonspecific T wave inversion in aVL and V1, of which to inversion aVL was also noted on most recent prior EKG, and showed no evidence of ST changes, including no evidence of ST elevation.  Patient was admitted under hospital service and nephrology consulted.  While in the ED, the following were administered: Albuterol nebulizer treatment x1, NovoLog 5 units IV x1, 1 amp of D50, Lokelma 10 g p.o. x1 followed by ordering of Lokelma , 10 g p.o. 3 times daily, with next dose scheduled to occur at 2200 this evening, hydralazine 50 mg p.o. x1, calcium chloride 1 g IV x1, and 1 L normal saline bolus.   Assessment & Plan:   Principal Problem:   Hyperkalemia Active Problems:   Hypertension   AKI (acute kidney injury) ([place])   Other specified hypothyroidism   HLD (hyperlipidemia)   Hyperkalemia: presenting serum potassium of 7.5 in the setting of acute kidney injury superimposed on stage IV chronic kidney disease, in the absence of associated EKG changes.  Received multiple treatments in the ED and afterwards. potassium now normal.  Acute kidney injury superimposed on stage IV chronic kidney disease: In the setting of a documented history of  intraparenchymal disease with associated baseline creatinine of 1.7-1.9, the patient's creatinine has been noted to be trending up following discharge  from recent hospitalization on 01/02/2021, with value of 2.7 on 01/07/2021 followed by today's value of 3.76.  Suspect potential contribution from hypertensive nephropathy.  Renal ultrasound raises suspicion for possible renal mass.  MRI renal is ordered by nephrology.  Patient's creatinine has improved from admission however has been stable around 2.9 since yesterday.  Appreciate nephrology help and defer management to them.  Metabolic acidosis: CO2 only 12 upon presentation.  She remains on bicarb drip and CO2 has improved to 20.  Nephrology managing.  Appreciate help.  Hypertensive urgency in a patient with essential hypertension: Blood pressure much better now.  Continue home dose of Imdur, metoprolol and hydralazine.  Add as needed IV hydralazine  Recent NSTEMI: Recent hospitalization is for NSTEMI, as further described above.  Unable to tolerate stress test during that hospitalization and patient and family elected to forego cardiac catheterization as well.  Continue rosuvastatin, metoprolol tartrate, Eliquis.  This morning her heart rate was up to 130 and she was complaining of chest pain.  Troponin checked which is elevated but coming down from previous numbers as expected.  Hyperlipidemia: Continue rosuvastatin.  Acquired hypothyroidism: Continue Synthroid.  Hypomagnesemia: Normal.  Paroxysmal atrial fibrillation: This morning she was having rates of 130.  She was in A. fib.  EKG confirmed that.  She was having some chest pain.  Checking troponin.  She received her home dose of Lopressor about 30 minutes before that.  Her heart rate is now under 80.  Monitor on telemetry.  Continue home dose of metoprolol and Eliquis.  History of right lower extremity DVT: Was diagnosed with DVT on 12/20/2020.  Continue Eliquis.  DVT prophylaxis:    Code Status: Full Code  Family Communication:  None present at bedside.  Plan of care discussed with patient in length and he verbalized understanding and  agreed with it.  It appears that nephrology had discussed with daughter who was at the bedside when nephrology rounded.  Status is: Inpatient  Remains inpatient appropriate because:Inpatient level of care appropriate due to severity of illness   Dispo: The patient is from: Home              Anticipated d/c is to: Home              Anticipated d/c date is: 1 to 2 days              Patient currently is not medically stable to d/c.   Difficult to place patient No        Estimated body mass index is 20.65 kg/m as calculated from the following:   Height as of this encounter: 5\' 4"  (1.626 m).   Weight as of this encounter: 54.6 kg.      Nutritional status:               Consultants:   Nephrology  Procedures:   None  Antimicrobials:  Anti-infectives (From admission, onward)   None         Subjective: Patient seen and examined.  Her heart rate was around 122 when I saw her.  She was complaining of palpitation and mild chest pain but no shortness of breath.  She looked comfortable and was not hypoxic.  Objective: Vitals:   01/15/21 0802 01/15/21 0900 01/15/21 1000 01/15/21 1100  BP: (!) 131/58 116/87 (!) 112/51 (!) 109/57  Pulse: 93 (!)  133 (!) 42 77  Resp: 20 (!) 27 19 19   Temp: 98.2 F (36.8 C)     TempSrc: Oral     SpO2: 98% 100% 94% 95%  Weight:      Height:        Intake/Output Summary (Last 24 hours) at 01/15/2021 1249 Last data filed at 01/15/2021 0440 Gross per 24 hour  Intake 1011.68 ml  Output --  Net 1011.68 ml   Filed Weights   01/14/21 0842 01/14/21 2249 01/15/21 0000  Weight: 55.8 kg 54.6 kg 54.6 kg    Examination:  General exam: Appears calm and comfortable  Respiratory system: Clear to auscultation. Respiratory effort normal. Cardiovascular system: S1 & S2 heard, irregularly irregular rate and rhythm. No JVD, murmurs, rubs, gallops or clicks. No pedal edema. Gastrointestinal system: Abdomen is nondistended, soft and  nontender. No organomegaly or masses felt. Normal bowel sounds heard. Central nervous system: Alert and oriented. No focal neurological deficits. Extremities: Symmetric 5 x 5 power. Skin: No rashes, lesions or ulcers.    Data Reviewed: I have personally reviewed following labs and imaging studies  CBC: Recent Labs  Lab 01/13/21 1515 01/14/21 0311 01/15/21 0323  WBC 10.1 9.6 9.1  NEUTROABS  --   --  5.9  HGB 10.1* 9.0* 8.5*  HCT 32.2* 27.9* 26.3*  MCV 90.7 90.6 87.4  PLT 341 258 858   Basic Metabolic Panel: Recent Labs  Lab 01/13/21 1515 01/13/21 1905 01/14/21 0002 01/14/21 0311 01/14/21 1752 01/15/21 0323  NA 132*  --  132* 134* 136 137  K >7.5* 6.5* 6.7* 5.9* 4.7 3.8  CL 109  --  113* 112* 108 103  CO2 13*  --  10* 12* 13* 20*  GLUCOSE 134*  --  132* 115* 164* 115*  BUN 46*  --  43* 43* 40* 46*  CREATININE 3.79*  --  3.41* 3.28* 2.93* 2.91*  CALCIUM 9.6  --  9.8 10.0 8.9 9.5  MG 1.7  --   --  1.6*  --  1.9  PHOS  --   --  5.4* 6.0* 7.4*  --    GFR: Estimated Creatinine Clearance: 14.4 mL/min (A) (by C-G formula based on SCr of 2.91 mg/dL (H)). Liver Function Tests: Recent Labs  Lab 01/13/21 1037 01/14/21 0002 01/14/21 0311 01/14/21 1752  AST 20  --  21  --   ALT 18  --  19  --   ALKPHOS 57  --  42  --   BILITOT 0.3  --  0.6  --   PROT 7.4  --  6.0*  --   ALBUMIN 4.4 3.4* 3.4* 3.2*   No results for input(s): LIPASE, AMYLASE in the last 168 hours. No results for input(s): AMMONIA in the last 168 hours. Coagulation Profile: No results for input(s): INR, PROTIME in the last 168 hours. Cardiac Enzymes: No results for input(s): CKTOTAL, CKMB, CKMBINDEX, TROPONINI in the last 168 hours. BNP (last 3 results) No results for input(s): PROBNP in the last 8760 hours. HbA1C: No results for input(s): HGBA1C in the last 72 hours. CBG: No results for input(s): GLUCAP in the last 168 hours. Lipid Profile: No results for input(s): CHOL, HDL, LDLCALC, TRIG,  CHOLHDL, LDLDIRECT in the last 72 hours. Thyroid Function Tests: Recent Labs    01/14/21 0311  TSH 3.879   Anemia Panel: Recent Labs    01/13/21 1037 01/14/21 0002  VITAMINB12 983*  --   FERRITIN  --  199  TIBC  --  270  IRON  --  65   Sepsis Labs: No results for input(s): PROCALCITON, LATICACIDVEN in the last 168 hours.  Recent Results (from the past 240 hour(s))  SARS Coronavirus 2 by RT PCR (hospital order, performed in [facility] hospital lab) Nasopharyngeal Nasopharyngeal Swab     Status: None   Collection Time: 01/13/21  8:27 PM   Specimen: Nasopharyngeal Swab  Result Value Ref Range Status   SARS Coronavirus 2 NEGATIVE NEGATIVE Final    Comment: (NOTE) SARS-CoV-2 target nucleic acids are NOT DETECTED.  The SARS-CoV-2 RNA is generally detectable in upper and lower respiratory specimens during the acute phase of infection. The lowest concentration of SARS-CoV-2 viral copies this assay can detect is 250 copies / mL. A negative result does not preclude SARS-CoV-2 infection and should not be used as the sole basis for treatment or other patient management decisions.  A negative result may occur with improper specimen collection / handling, submission of specimen other than nasopharyngeal swab, presence of viral mutation(s) within the areas targeted by this assay, and inadequate number of viral copies (<250 copies / mL). A negative result must be combined with clinical observations, patient history, and epidemiological information.  Fact Sheet for Patients:   StrictlyIdeas.no  Fact Sheet for Healthcare Providers: BankingDealers.co.za  This test is not yet approved or  cleared by the [place] FDA and has been authorized for detection and/or diagnosis of SARS-CoV-2 by FDA under an Emergency Use Authorization (EUA).  This EUA will remain in effect (meaning this test can be used) for the duration of the COVID-19  declaration under Section 564(b)(1) of the Act, 21 U.S.C. section 360bbb-3(b)(1), unless the authorization is terminated or revoked sooner.  Performed at [facility], [place] [address], [place], [place] [address]       Radiology Studies: US RENAL  Result Date: 01/14/2021 CLINICAL DATA:  Acute renal injury EXAM: RENAL / URINARY TRACT ULTRASOUND COMPLETE COMPARISON:  12/27/2020 FINDINGS: Right Kidney: Renal measurements: 9.5 x 4.7 x 5.1 cm. = volume: 118 mL. Multiple cysts are identified. The largest of these measures 2.7 cm in the upper pole. No mass lesion or hydronephrosis is noted. Left Kidney: Renal measurements: 10.8 x 4.8 x 4.9 cm. = volume: 133 mL. Multiple simple appearing cysts are noted similar to that seen on the prior exam. Additionally there is a 2.3 cm solid appearing lesion in the midportion of the left kidney which was not well appreciated on the prior exam. Nonobstructing stone is noted as well. Bladder: Appears normal for degree of bladder distention. Other: None. IMPRESSION: Multiple bilateral simple cysts are noted. There is suspicion of a solid lesion within the midportion of the left kidney. This may represent a hemorrhagic cyst but further evaluation is recommended. Nonemergent noncontrast MRI would be helpful for further evaluation of this lesion given some discrepancy from recent ultrasound and CT examination. No evidence of obstruction. Small nonobstructing left renal stone is noted. Electronically Signed   By: Inez Catalina M.D.   On: 01/14/2021 01:59    Scheduled Meds: . apixaban  5 mg Oral BID  . cilostazol  100 mg Oral BID  . escitalopram  20 mg Oral Daily  . famotidine  20 mg Oral q AM  . fluticasone  2 spray Each Nare Daily  . hydrALAZINE  25 mg Oral TID  . isosorbide mononitrate  30 mg Oral Daily  . levothyroxine  50 mcg Oral QAC breakfast  . LORazepam  1 mg Oral  BID  . mouth rinse  15 mL Mouth Rinse BID  . metoprolol tartrate  50 mg Oral BID  .  rosuvastatin  20 mg Oral Daily   Continuous Infusions: . sodium bicarbonate (isotonic) 150 mEq in D5W 1000 mL infusion 75 mL/hr at 01/15/21 0951     LOS: 2 days   Time spent: 31 minutes   Darliss Cheney, MD [facility]  01/15/2021, 12:49 PM   To contact the attending provider between 7A-7P or the covering provider during after hours 7P-7A, please log into the web site [email or web address].

## 2021-01-15 NOTE — Evaluation (Signed)
Occupational Therapy Evaluation Patient Details Name: [name] MRN: [id number] DOB: 1946/05/19 Today's Date: 01/15/2021    History of Present Illness 75 y.o. female with medical history significant for recent hospitalization for NSTEMI (12/26/2020 - 01/02/2021), peripheral artery disease, paroxysmal atrial fibrillation chronically anticoagulated on Eliquis, hypertension, hyperlipidemia, stage IV chronic kidney disease with baseline creatinine 1.7-1.9, acquired hypothyroidism, laryngeal cancer status post laryngectomy in 2013 and s/p chemo/radiation, who was sent to ED due to abnormal labs such as elevated creatinine and hyperkalemia.   Clinical Impression   Patient admitted for the diagnosis above.  She is normally independent with all mobility and care at her daughter's home.  Currently, she is very close to her baseline, needing only setup and supervision dure to line/leads in the acute setting.  OT will follow her in the acute setting, checking in on her for 1x/wk to ensure she maintains her level of function.  She plans on going home.  No real OT needs post acute.      Follow Up Recommendations  No OT follow up    Equipment Recommendations  None recommended by OT    Recommendations for Other Services       Precautions / Restrictions Precautions Precautions: Other (comment) Precaution Comments: HR Restrictions Weight Bearing Restrictions: No      Mobility Bed Mobility Overal bed mobility: Independent             General bed mobility comments: HOB elevated    Transfers Overall transfer level: Needs assistance Equipment used: None Transfers: Sit to/from Stand;Stand Pivot Transfers Sit to Stand: Supervision Stand pivot transfers: Supervision            Balance Overall balance assessment: Mild deficits observed, not formally tested                                         ADL either performed or assessed with clinical judgement   ADL  Overall ADL's : Needs assistance/impaired     Grooming: Wash/dry hands;Wash/dry face;Supervision/safety;Standing           Upper Body Dressing : Set up;Standing   Lower Body Dressing: Set up;Sitting/lateral leans   Toilet Transfer: Supervision/safety;Ambulation   Toileting- Clothing Manipulation and Hygiene: Independent;Sit to/from stand       Functional mobility during ADLs: Supervision/safety       Vision Baseline Vision/History: Wears glasses Wears Glasses: Reading only Patient Visual Report: No change from baseline       Perception     Praxis      Pertinent Vitals/Pain Pain Assessment: Faces Pain Score: 10-Worst pain ever (decreased with resting) Faces Pain Scale: Hurts a little bit Pain Location: calves Pain Descriptors / Indicators: Tender Pain Intervention(s): Monitored during session     Hand Dominance Right   Extremity/Trunk Assessment Upper Extremity Assessment Upper Extremity Assessment: Overall WFL for tasks assessed   Lower Extremity Assessment Lower Extremity Assessment: Defer to PT evaluation   Cervical / Trunk Assessment Cervical / Trunk Assessment: Normal   Communication Communication Communication: Prefers language other than English   Cognition Arousal/Alertness: Awake/alert Behavior During Therapy: WFL for tasks assessed/performed Overall Cognitive Status: Within Functional Limits for tasks assessed                                     General Comments  VSS  on RA    Exercises     Shoulder Instructions      Home Living Family/patient expects to be discharged to:: Private residence Living Arrangements: Children Available Help at Discharge: Family;Available PRN/intermittently Type of Home: House Home Access: Stairs to enter [employer] of Steps: 3 Entrance Stairs-Rails: Right Home Layout: One level     Bathroom Shower/Tub: Teacher, early years/pre: Standard Bathroom Accessibility:  Yes How Accessible: Accessible via walker Home Equipment: Shower seat          Prior Functioning/Environment Level of Independence: Independent                 OT Problem List: Pain      OT Treatment/Interventions: Self-care/ADL training;Balance training;Therapeutic activities    OT Goals(Current goals can be found in the care plan section) Acute Rehab OT Goals Patient Stated Goal: hopefully go home soon. OT Goal Formulation: With patient Time For Goal Achievement: 01/29/21 Potential to Achieve Goals: Good ADL Goals Pt Will Perform Lower Body Bathing: Independently;sit to/from stand Pt Will Perform Lower Body Dressing: Independently;sit to/from stand Pt Will Transfer to Toilet: Independently;ambulating;regular height toilet Pt Will Perform Toileting - Clothing Manipulation and hygiene: Independently;sit to/from stand  OT Frequency: Min 1X/week   Barriers to D/C:    none noted       Co-evaluation              AM-PAC OT "6 Clicks" Daily Activity     Outcome Measure Help from another person eating meals?: None Help from another person taking care of personal grooming?: None Help from another person toileting, which includes using toliet, bedpan, or urinal?: A Little Help from another person bathing (including washing, rinsing, drying)?: A Little Help from another person to put on and taking off regular upper body clothing?: A Little Help from another person to put on and taking off regular lower body clothing?: A Little 6 Click Score: 20   End of Session Equipment Utilized During Treatment: Gait belt Nurse Communication: Mobility status  Activity Tolerance: Patient tolerated treatment well Patient left: in bed;with call bell/phone within reach  OT Visit Diagnosis: Pain Pain - Right/Left: Left Pain - part of body: Ankle and joints of foot                Time: 1440-1500 OT Time Calculation (min): 20 min Charges:  OT General Charges $OT Visit: 1  Visit OT Evaluation $OT Eval Moderate Complexity: 1 Mod  01/15/2021  Rich, OTR/L  Acute Rehabilitation Services  Office:  [phone]   Metta Clines 01/15/2021, 3:11 PM

## 2021-01-15 NOTE — Progress Notes (Signed)
  Echocardiogram 2D Echocardiogram has been performed.  [name] 01/15/2021, 4:06 PM

## 2021-01-16 ENCOUNTER — Inpatient Hospital Stay (HOSPITAL_COMMUNITY): Payer: Medicare Other

## 2021-01-16 ENCOUNTER — Ambulatory Visit: Payer: Medicare Other | Admitting: Cardiology

## 2021-01-16 LAB — CBC WITH DIFFERENTIAL/PLATELET
Abs Immature Granulocytes: 0.04 10*3/uL (ref 0.00–0.07)
Basophils Absolute: 0.1 10*3/uL (ref 0.0–0.1)
Basophils Relative: 1 %
Eosinophils Absolute: 0.1 10*3/uL (ref 0.0–0.5)
Eosinophils Relative: 2 %
HCT: 22.6 % — ABNORMAL LOW (ref 36.0–46.0)
Hemoglobin: 7.8 g/dL — ABNORMAL LOW (ref 12.0–15.0)
Immature Granulocytes: 1 %
Lymphocytes Relative: 27 %
Lymphs Abs: 1.6 10*3/uL (ref 0.7–4.0)
MCH: 29 pg (ref 26.0–34.0)
MCHC: 34.5 g/dL (ref 30.0–36.0)
MCV: 84 fL (ref 80.0–100.0)
Monocytes Absolute: 0.6 10*3/uL (ref 0.1–1.0)
Monocytes Relative: 10 %
Neutro Abs: 3.6 10*3/uL (ref 1.7–7.7)
Neutrophils Relative %: 59 %
Platelets: 215 10*3/uL (ref 150–400)
RBC: 2.69 MIL/uL — ABNORMAL LOW (ref 3.87–5.11)
RDW: 13.5 % (ref 11.5–15.5)
WBC: 6 10*3/uL (ref 4.0–10.5)
nRBC: 0 % (ref 0.0–0.2)

## 2021-01-16 LAB — BASIC METABOLIC PANEL
Anion gap: 13 (ref 5–15)
BUN: 37 mg/dL — ABNORMAL HIGH (ref 8–23)
CO2: 27 mmol/L (ref 22–32)
Calcium: 8.9 mg/dL (ref 8.9–10.3)
Chloride: 100 mmol/L (ref 98–111)
Creatinine, Ser: 2.57 mg/dL — ABNORMAL HIGH (ref 0.44–1.00)
GFR, Estimated: 19 mL/min — ABNORMAL LOW (ref >60)
Glucose, Bld: 100 mg/dL — ABNORMAL HIGH (ref 70–99)
Potassium: 3.4 mmol/L — ABNORMAL LOW (ref 3.5–5.1)
Sodium: 140 mmol/L (ref 135–145)

## 2021-01-16 LAB — MAGNESIUM: Magnesium: 1.7 mg/dL (ref 1.7–2.4)

## 2021-01-16 MED ORDER — SODIUM CHLORIDE 0.9 % IV SOLN
510.0000 mg | Freq: Once | INTRAVENOUS | Status: AC
  Administered 2021-01-16: 510 mg via INTRAVENOUS
  Filled 2021-01-16: qty 17

## 2021-01-16 MED ORDER — APIXABAN 5 MG PO TABS: 5.0000 mg | ORAL_TABLET | Freq: Two times a day (BID) | ORAL | 2 refills | Status: DC

## 2021-01-16 MED ORDER — DARBEPOETIN ALFA 150 MCG/0.3ML IJ SOSY
150.0000 ug | PREFILLED_SYRINGE | INTRAMUSCULAR | Status: DC
  Administered 2021-01-16: 150 ug via SUBCUTANEOUS
  Filled 2021-01-16: qty 0.3

## 2021-01-16 MED ORDER — POTASSIUM CHLORIDE CRYS ER 20 MEQ PO TBCR
40.0000 meq | EXTENDED_RELEASE_TABLET | Freq: Once | ORAL | Status: AC
  Administered 2021-01-16: 40 meq via ORAL
  Filled 2021-01-16: qty 2

## 2021-01-16 NOTE — Plan of Care (Signed)

## 2021-01-16 NOTE — TOC Transition Note (Signed)
Transition of Care [facility]) - CM/SW Discharge Note   Patient Details  Name: [name] MRN: [id number] Date of Birth: [birth year]  [facility] CM/SW Contact:  Zenon Mayo, RN Phone Number: 01/16/2021, 10:05 AM   Clinical Narrative:    Patient lives with daughter, she is for dc today, daughter states she will transport her home and that she has no DME at home and does not need any DME.  Daughter states she transports patient to her MD apts and gets her medications for her as well.    Final next level of care: Home/Self Care Barriers to Discharge: No Barriers Identified   Patient Goals and CMS Choice     Choice offered to / list presented to : NA  Discharge Placement                       Discharge Plan and Services   Discharge Planning Services: CM Consult Post Acute Care Choice: NA            DME Agency: NA       HH Arranged: NA          Social Determinants of Health (SDOH) Interventions Food Insecurity Interventions: Intervention Not Indicated Social Connections Interventions: Intervention Not Indicated Transportation Interventions: Intervention Not Indicated   Readmission Risk Interventions Readmission Risk Prevention Plan 01/16/2021 01/02/2021  Transportation Screening Complete Complete  PCP or Specialist Appt within 3-5 Days Complete Complete  HRI or Home Care Consult Complete Complete  Social Work Consult for Enterprise Planning/Counseling Complete Complete  Palliative Care Screening Not Applicable Not Applicable  Medication Review [employer] photographer) Complete Complete  Some recent data might be hidden

## 2021-01-16 NOTE — Discharge Summary (Signed)
Physician Discharge Summary  [name] XQJ:[id number] DOB: 21-Sep-1946 DOA: 01/13/2021  PCP: Janith Lima, MD  Admit date: 01/13/2021 Discharge date: 01/16/2021 30 Day Unplanned Readmission Risk Score   Flowsheet Row ED to Hosp-Admission (Current) from 01/13/2021 in [facility]  30 Day Unplanned Readmission Risk Score (%) 27.46 Filed at 01/16/2021 0801     This score is the patient's risk of an unplanned readmission within 30 days of being discharged (0 -100%). The score is based on dignosis, age, lab data, medications, orders, and past utilization.   Low:  0-14.9   Medium: 15-21.9   High: 22-29.9   Extreme: 30 and above         Admitted From: Home Disposition: Home  Recommendations for Outpatient Follow-up:  1. Follow up with PCP in 1-2 weeks 2. Follow-up with nephrology in 1 week 3. Please obtain BMP/CBC in one week 4. Please follow up with your PCP on the following pending results: Unresulted Labs (From admission, onward)          Start     Ordered   01/16/21 4481  Basic metabolic panel  Daily,   R      01/15/21 Lima: None Equipment/Devices: None  Discharge Condition: Stable CODE STATUS: Full code Diet recommendation: Renal/low-sodium  Subjective: Seen and examined.  She feels excellent.  No complaints.  She is happy that she has improved and does not have any complaint and she is happy to hear that she can be discharged today.  Brief/Interim Summary: [name] a 75 y.o.femalewith medical history significant forrecent hospitalization for NSTEMI (12/26/2020 - 01/02/2021), peripheral artery disease, paroxysmal atrial fibrillation chronically anticoagulated on Eliquis, hypertension, hyperlipidemia, stage IV chronic kidney disease with baseline creatinine 1.7-1.9, acquired hypothyroidism,laryngeal cancer status postlaryngectomy in 2013 and s/p chemo/radiation,who was sent to ED due to abnormal labs such as  elevated creatinine and hyperkalemia.  In short, patient was admitted with AKI on CKD stage IV and hyperkalemia.    Patient received multiple treatments for hyperkalemia.  Within 48 hours, her potassium improved and in fact it is slightly lower at 3.4 today so she is going to get potassium replacement today.  Her renal function also improved.  Her baseline previously was known to be 1.7-1.9 and she presented with 3.76 which is improved to 2.57 with good urine output.  Nephrology was following this patient and they are comfortable with her going home with this creatinine as she has close follow-up with nephrology as outpatient.  She also had metabolic acidosis for which she was started on bicarb drip.  Her CO2 upon presentation was 12 which has improved more than 20 now.  She also came in with hypertensive urgency which improved with resuming her home medications.  Yesterday, she also had atrial fibrillation with RVR and complaint of chest pain.  Cardiac enzymes were checked which were elevated but they are downtrending from her recent high due to NSTEMI.  During recent hospitalization, family and patient had chosen to go with medical management only.  Transthoracic echo was done and that did not show any wall motion abnormality.  Patient was also evaluated by PT OT who did not recommend any further PT OT.  Patient is a stable so she is going to be discharged home.  Patient's daughter has been informed and contacted by TOC.  Of note, her hemoglobin is slightly lower than her baseline however there  is no indication of any active bleeding.  When looked carefully, all 3 cell lines including white blood cells, hemoglobin and platelets are down compared to yesterday so I doubt any active blood loss.  Nephrology has ordered Aranesp and Feraheme for this patient to be given before discharge today.  Discharge Diagnoses:  Principal Problem:   Hyperkalemia Active Problems:   Hypertension   AKI (acute kidney injury)  ([place])   Other specified hypothyroidism   HLD (hyperlipidemia)    Discharge Instructions   Allergies as of 01/16/2021      Reactions   Xyzal [levocetirizine Dihydrochloride] Itching   Augmentin [amoxicillin-pot Clavulanate] Diarrhea, Nausea And Vomiting   Tribenzor [olmesartan-amlodipine-hctz] Other (See Comments)   "Hurt the kidneys"      Medication List    STOP taking these medications   Eliquis DVT/PE Starter Pack Generic drug: Apixaban Starter Pack (10mg  and 5mg ) Replaced by: apixaban 5 MG Tabs tablet   HYDROcodone-acetaminophen 7.5-325 mg/15 ml solution Commonly known as: HYCET     TAKE these medications   apixaban 5 MG Tabs tablet Commonly known as: ELIQUIS Take 1 tablet (5 mg total) by mouth 2 (two) times daily. Replaces: Eliquis DVT/PE Starter Pack   cilostazol 100 MG tablet Commonly known as: PLETAL Take 1 tablet (100 mg total) by mouth 2 (two) times daily.   diphenoxylate-atropine 2.5-0.025 MG tablet Commonly known as: LOMOTIL Take 1 tablet by mouth 4 (four) times daily as needed for diarrhea or loose stools.   Ensure Take 237 mLs by mouth 2 (two) times daily.   lactose free nutrition Liqd Take 237 mLs by mouth 2 (two) times daily between meals.   escitalopram 20 MG tablet Commonly known as: LEXAPRO Take 1 tablet (20 mg total) by mouth daily.   famotidine 40 MG tablet Commonly known as: PEPCID TAKE 1 TABLET BY MOUTH EVERY DAY What changed: when to take this   fluticasone 50 MCG/ACT nasal spray Commonly known as: FLONASE SPRAY 2 SPRAYS INTO EACH NOSTRIL EVERY DAY What changed:   how much to take  how to take this  when to take this  additional instructions   folic acid 1 MG tablet Commonly known as: FOLVITE Take 1 tablet (1 mg total) by mouth daily.   hydrALAZINE 50 MG tablet Commonly known as: APRESOLINE Take 1.5 tablets (75 mg total) by mouth 3 (three) times daily. What changed: how much to take   isosorbide mononitrate 30 MG 24  hr tablet Commonly known as: IMDUR Take 1 tablet (30 mg total) by mouth daily.   levothyroxine 50 MCG tablet Commonly known as: Synthroid Take 1 tablet (50 mcg total) by mouth daily before breakfast.   loperamide 2 MG tablet Commonly known as: IMODIUM A-D Take 2 mg by mouth 4 (four) times daily as needed for diarrhea or loose stools.   LORazepam 1 MG tablet Commonly known as: ATIVAN TAKE 1 TABLET BY MOUTH EVERY DAY What changed: when to take this   metoprolol tartrate 50 MG tablet Commonly known as: LOPRESSOR Take 1 tablet (50 mg total) by mouth 2 (two) times daily.   ondansetron 8 MG disintegrating tablet Commonly known as: Zofran ODT Take 1 tablet (8 mg total) by mouth every 8 (eight) hours as needed for nausea or vomiting.   rosuvastatin 20 MG tablet Commonly known as: CRESTOR Take 1 tablet (20 mg total) by mouth daily.   thiamine 100 MG tablet Commonly known as: Vitamin B-1 Take 1 tablet (100 mg total) by mouth daily.  Follow-up Information    Janith Lima, MD. Go on 01/21/2021.   Specialty: Internal Medicine Why: @8 :Max [place]ane information: [place] [phone]              Allergies  Allergen Reactions  . Xyzal [Levocetirizine Dihydrochloride] Itching  . Augmentin [Amoxicillin-Pot Clavulanate] Diarrhea and Nausea And Vomiting  . Tribenzor [Olmesartan-Amlodipine-Hctz] Other (See Comments)    "Hurt the kidneys"    Consultations: Nephrology   Procedures/Studies: DG Chest 2 View  Result Date: 12/27/2020 CLINICAL DATA:  Cough, fever EXAM: CHEST - 2 VIEW COMPARISON:  10/30/2015 FINDINGS: The heart size and mediastinal contours are within normal limits. Atherosclerotic calcification of the aortic knob. No focal airspace consolidation, pleural effusion, or pneumothorax. The visualized skeletal structures are unremarkable. IMPRESSION: No active cardiopulmonary disease. Electronically Signed   By: Davina Poke  D.O.   On: 12/27/2020 11:51   DG Lumbar Spine Complete  Result Date: 12/20/2020 CLINICAL DATA:  Left low back pain with sciatica. EXAM: LUMBAR SPINE - COMPLETE 4+ VIEW COMPARISON:  Abdomen and pelvis CT dated 05/02/2003. FINDINGS: Five non-rib-bearing lumbar vertebrae. Straightening of the normal lumbar lordosis. Minimal degenerative changes at the L2-3 level with mild-to-moderate disc space narrowing and minimal anterior spur formation with discogenic sclerosis. Mild facet degenerative changes at the L5-S1 level with associated grade 1 anterolisthesis. No pars defects or fractures seen. Normal bowel gas pattern. Atheromatous arterial calcifications. These make it difficult to detect urinary tract calculi. Possible tiny bilateral renal calculi. IMPRESSION: 1. Mild degenerative changes, as described above. 2. Possible tiny bilateral renal calculi. 3. No acute abnormality. Electronically Signed   By: Claudie Revering M.D.   On: 12/20/2020 15:23   MR ABDOMEN WO CONTRAST  Result Date: 01/16/2021 CLINICAL DATA:  Evaluate left renal lesion seen on recent renal ultrasound and right renal lesions seen on recent CT scan. EXAM: MRI ABDOMEN WITHOUT CONTRAST TECHNIQUE: Multiplanar multisequence MR imaging was performed without the administration of intravenous contrast. COMPARISON:  CT scan 12/27/2020 and renal ultrasound 01/14/2021 FINDINGS: Examination is quite limited without IV contrast which is usually fairly critical for evaluation of renal masses. Lower chest: The lung bases are grossly clear. No pulmonary lesions or pleural effusion. The heart is normal in size. No pericardial effusion. Hepatobiliary: No hepatic lesions are identified without contrast. No intrahepatic biliary dilatation. The gallbladder appears normal. No common bile duct dilatation. Pancreas: 5 mm cystic structures noted in the pancreatic body. This is likely benign but difficult to identify on prior CT scans. No acute inflammation or ductal  dilatation. Spleen:  Normal size.  No cul lesions. Adrenals/Urinary Tract:  The adrenal glands are unremarkable. There are numerous bilateral simple renal cysts and numerous hemorrhagic or proteinaceous cysts. The upper pole right renal lesions seen on the recent CT scan corresponds to a 2.6 cm hemorrhagic cyst. Midpole left renal lesion laterally measures 2.2 x 1.8 cm and appears to correlate with the abnormality seen on the ultrasound. It has heterogeneous low T2 and high T2 signal intensity in a few minimal areas of dependent increased T1 signal intensity. Findings worrisome for a solid renal neoplasm. Difficult to be certain without contrast however. Stomach/Bowel: The stomach, duodenum, visualized small bowel and visualized colon are grossly normal. Vascular/Lymphatic: Advanced vascular disease without focal aneurysm. No mesenteric or retroperitoneal mass or adenopathy. Other:  No ascites or abdominal wall hernia. Musculoskeletal: No significant bony findings. IMPRESSION: 1. Multiple bilateral renal lesions. Most of these are either simple cysts or hemorrhagic/proteinaceous  cysts. 2. The upper pole right renal lesions seen on the recent CT scan corresponds to a benign hemorrhagic cyst. 3. The left midpole renal lesion laterally, seen on the ultrasound examination, corresponds to an indeterminate lesion. This will require surveillance. Recommend follow-up MR examination in 6 months. 4. No other significant abdominal findings, mass lesions or adenopathy. 5. Advanced vascular disease. Electronically Signed   By: Marijo Sanes M.D.   On: 01/16/2021 07:54   US RENAL  Result Date: 01/14/2021 CLINICAL DATA:  Acute renal injury EXAM: RENAL / URINARY TRACT ULTRASOUND COMPLETE COMPARISON:  12/27/2020 FINDINGS: Right Kidney: Renal measurements: 9.5 x 4.7 x 5.1 cm. = volume: 118 mL. Multiple cysts are identified. The largest of these measures 2.7 cm in the upper pole. No mass lesion or hydronephrosis is noted. Left  Kidney: Renal measurements: 10.8 x 4.8 x 4.9 cm. = volume: 133 mL. Multiple simple appearing cysts are noted similar to that seen on the prior exam. Additionally there is a 2.3 cm solid appearing lesion in the midportion of the left kidney which was not well appreciated on the prior exam. Nonobstructing stone is noted as well. Bladder: Appears normal for degree of bladder distention. Other: None. IMPRESSION: Multiple bilateral simple cysts are noted. There is suspicion of a solid lesion within the midportion of the left kidney. This may represent a hemorrhagic cyst but further evaluation is recommended. Nonemergent noncontrast MRI would be helpful for further evaluation of this lesion given some discrepancy from recent ultrasound and CT examination. No evidence of obstruction. Small nonobstructing left renal stone is noted. Electronically Signed   By: Inez Catalina M.D.   On: 01/14/2021 01:59   US RENAL  Result Date: 12/27/2020 CLINICAL DATA:  Renal mass EXAM: RENAL / URINARY TRACT ULTRASOUND COMPLETE COMPARISON:  Same day CT abdomen pelvis, 12:34 a.m. FINDINGS: Right Kidney: Renal measurements: 9.4 x 5.4 x 4.8 cm = volume: 129 mL. Echogenicity within normal limits. Multiple simple appearing cysts measuring up to 2.5 cm. No solid mass or hydronephrosis visualized. Left Kidney: Renal measurements: 9.2 x 4.8 x 5.0 cm = volume: 114 mL mL. Echogenicity within normal limits. Multiple simple appearing cysts measuring up to 2.1 cm. No solid mass or hydronephrosis visualized. Bladder: Appears normal for degree of bladder distention. Other: None. IMPRESSION: Multiple bilateral simple appearing renal cysts. No evidence of solid mass, with particular attention to the superior pole of the right kidney. There is a simple cyst in this location which appears to correspond to finding of prior CT. Electronically Signed   By: Eddie Candle M.D.   On: 12/27/2020 11:08   NM Pulmonary Perf and Vent  Result Date:  12/27/2020 CLINICAL DATA:  Squamous cell carcinoma with chest pain EXAM: NUCLEAR MEDICINE PERFUSION LUNG SCAN TECHNIQUE: Perfusion images were obtained in multiple projections after intravenous injection of radiopharmaceutical. Views: Anterior, posterior, left lateral, right lateral, RPO, LPO, RAO, LAO. RADIOPHARMACEUTICALS:  4.25 mCi Tc-71m MAA IV COMPARISON:  Chest radiograph December 27, 2020 FINDINGS: Radiotracer uptake is homogeneous and symmetric bilaterally. No perfusion defects evident. IMPRESSION: No perfusion defects evident. No findings indicative of pulmonary embolus. Electronically Signed   By: Lowella Grip III M.D.   On: 12/27/2020 13:55   ECHOCARDIOGRAM COMPLETE  Result Date: 01/15/2021    ECHOCARDIOGRAM REPORT   Patient Name:   [name] Date of Exam: 01/15/2021 Medical Rec #:  [id number]        Height:       64.0 in Accession #:  [id number]       Weight:       120.3 lb Date of Birth:  December 20, 1945       BSA:          1.576 m Patient Age:    58 years         BP:           109/57 mmHg Patient Gender: F                HR:           76 bpm. Exam Location:  Inpatient Procedure: 2D Echo, Color Doppler and Cardiac Doppler Indications:    Chest Pain at Rest  History:        Patient has prior history of Echocardiogram examinations, most                 recent 12/27/2020. Stroke and COPD, Signs/Symptoms:Shortness of                 Breath and Dyspnea; Risk Factors:Hypertension.  Sonographer:    Bernadene Person RDCS Referring Phys: [id number] Kammie Scioli Jerome  1. Left ventricular ejection fraction, by estimation, is 70 to 75%. The left ventricle has hyperdynamic function. The left ventricle has no regional wall motion abnormalities. There is mild left ventricular hypertrophy. Left ventricular diastolic parameters are indeterminate.  2. Right ventricular systolic function is normal. The right ventricular size is normal. There is normal pulmonary artery systolic pressure.  3. The mitral valve is  abnormal. No evidence of mitral valve regurgitation. No evidence of mitral stenosis. Moderate mitral annular calcification.  4. The aortic valve was not well visualized. Aortic valve regurgitation is not visualized. No aortic stenosis is present.  5. The inferior vena cava is normal in size with greater than 50% respiratory variability, suggesting right atrial pressure of 3 mmHg. FINDINGS  Left Ventricle: Left ventricular ejection fraction, by estimation, is 70 to 75%. The left ventricle has hyperdynamic function. The left ventricle has no regional wall motion abnormalities. The left ventricular internal cavity size was normal in size. There is mild left ventricular hypertrophy. Left ventricular diastolic parameters are indeterminate. Right Ventricle: The right ventricular size is normal. No increase in right ventricular wall thickness. Right ventricular systolic function is normal. There is normal pulmonary artery systolic pressure. The tricuspid regurgitant velocity is 1.83 m/s, and  with an assumed right atrial pressure of 3 mmHg, the estimated right ventricular systolic pressure is 62.6 mmHg. Left Atrium: Left atrial size was normal in size. Right Atrium: Right atrial size was normal in size. Pericardium: Trivial pericardial effusion is present. Mitral Valve: The mitral valve is abnormal. Moderate mitral annular calcification. No evidence of mitral valve regurgitation. No evidence of mitral valve stenosis. Tricuspid Valve: The tricuspid valve is normal in structure. Tricuspid valve regurgitation is trivial. Aortic Valve: The aortic valve was not well visualized. Aortic valve regurgitation is not visualized. No aortic stenosis is present. Pulmonic Valve: The pulmonic valve was not well visualized. Pulmonic valve regurgitation is trivial. Aorta: The aortic root and ascending aorta are structurally normal, with no evidence of dilitation. Venous: The inferior vena cava is normal in size with greater than 50%  respiratory variability, suggesting right atrial pressure of 3 mmHg. IAS/Shunts: The interatrial septum was not well visualized.  LEFT VENTRICLE PLAX 2D LVIDd:         3.60 cm  Diastology LVIDs:         1.80 cm  LV e' medial:  6.22 cm/s LV PW:         1.10 cm  LV E/e' medial:  11.4 LV IVS:        1.10 cm  LV e' lateral:   7.53 cm/s LVOT diam:     1.90 cm  LV E/e' lateral: 9.4 LV SV:         72 LV SV Index:   46 LVOT Area:     2.84 cm  RIGHT VENTRICLE RV S prime:     16.00 cm/s TAPSE (M-mode): 2.2 cm LEFT ATRIUM             Index       RIGHT ATRIUM           Index LA diam:        2.70 cm 1.71 cm/m  RA Area:     12.60 cm LA Vol (A2C):   48.7 ml 30.90 ml/m RA Volume:   28.30 ml  17.95 ml/m LA Vol (A4C):   43.0 ml 27.28 ml/m LA Biplane Vol: 46.1 ml 29.25 ml/m  AORTIC VALVE LVOT Vmax:   119.00 cm/s LVOT Vmean:  86.200 cm/s LVOT VTI:    0.254 m  AORTA Ao Root diam: 2.90 cm Ao Asc diam:  2.50 cm MITRAL VALVE               TRICUSPID VALVE MV Area (PHT): 2.83 cm    TR Peak grad:   13.4 mmHg MV Decel Time: 268 msec    TR Vmax:        183.00 cm/s MV E velocity: 71.10 cm/s MV A velocity: 84.40 cm/s  SHUNTS MV E/A ratio:  0.84        Systemic VTI:  0.25 m                            Systemic Diam: 1.90 cm Oswaldo Milian MD Electronically signed by Oswaldo Milian MD Signature Date/Time: 01/15/2021/10:15:27 PM    Final    ECHOCARDIOGRAM COMPLETE  Result Date: 12/27/2020    ECHOCARDIOGRAM REPORT   Patient Name:   [name] Date of Exam: 12/27/2020 Medical Rec #:  [id number]        Height:       64.0 in Accession #:    [id number]       Weight:       127.0 lb Date of Birth:  01-31-1946       BSA:          1.613 m Patient Age:    37 years         BP:           135/66 mmHg Patient Gender: F                HR:           69 bpm. Exam Location:  Inpatient Procedure: 2D Echo, Cardiac Doppler and Color Doppler Indications:    Elevated Troponin.  History:        Patient has no prior history of Echocardiogram  examinations.                 Stroke, COPD and PAD, Signs/Symptoms:Dyspnea; Risk                 Factors:Hypertension and Dyslipidemia. CKD. Cancer. GERD.  Sonographer:    Jonelle Sidle Dance Referring Phys: 36 Virgie  1. Left ventricular ejection fraction, by estimation, is  55 to 60%. The left ventricle has normal function. The left ventricle has no regional wall motion abnormalities. Left ventricular diastolic parameters are indeterminate.  2. Right ventricular systolic function is normal. The right ventricular size is normal.  3. The mitral valve is grossly normal. No evidence of mitral valve regurgitation.  4. The aortic valve is tricuspid. Aortic valve regurgitation is not visualized. No aortic stenosis is present. Comparison(s): No prior Echocardiogram. Conclusion(s)/Recommendation(s): Normal biventricular function without evidence of hemodynamically significant valvular heart disease. FINDINGS  Left Ventricle: Left ventricular ejection fraction, by estimation, is 55 to 60%. The left ventricle has normal function. The left ventricle has no regional wall motion abnormalities. The left ventricular internal cavity size was small. There is no left ventricular hypertrophy. Left ventricular diastolic parameters are indeterminate. Right Ventricle: The right ventricular size is normal. No increase in right ventricular wall thickness. Right ventricular systolic function is normal. Left Atrium: Left atrial size was normal in size. Right Atrium: Right atrial size was normal in size. Pericardium: There is no evidence of pericardial effusion. Mitral Valve: The mitral valve is grossly normal. Mild mitral annular calcification. No evidence of mitral valve regurgitation. Tricuspid Valve: The tricuspid valve is grossly normal. Tricuspid valve regurgitation is not demonstrated. Aortic Valve: The aortic valve is tricuspid. There is mild aortic valve annular calcification. Aortic valve regurgitation is not  visualized. No aortic stenosis is present. Pulmonic Valve: The pulmonic valve was grossly normal. Pulmonic valve regurgitation is trivial. Aorta: The aortic root and ascending aorta are structurally normal, with no evidence of dilitation. IAS/Shunts: The atrial septum is grossly normal.  LEFT VENTRICLE PLAX 2D LVIDd:         3.70 cm  Diastology LVIDs:         3.50 cm  LV e' medial:    4.13 cm/s LV PW:         1.30 cm  LV E/e' medial:  14.3 LV IVS:        0.90 cm  LV e' lateral:   3.70 cm/s LVOT diam:     1.70 cm  LV E/e' lateral: 15.9 LV SV:         41 LV SV Index:   25 LVOT Area:     2.27 cm  RIGHT VENTRICLE             IVC RV Basal diam:  2.20 cm     IVC diam: 1.50 cm RV S prime:     12.10 cm/s TAPSE (M-mode): 1.4 cm LEFT ATRIUM             Index       RIGHT ATRIUM          Index LA diam:        3.70 cm 2.29 cm/m  RA Area:     9.07 cm LA Vol (A2C):   66.6 ml 41.29 ml/m RA Volume:   18.00 ml 11.16 ml/m LA Vol (A4C):   32.5 ml 20.15 ml/m LA Biplane Vol: 47.9 ml 29.70 ml/m  AORTIC VALVE LVOT Vmax:   88.60 cm/s LVOT Vmean:  51.100 cm/s LVOT VTI:    0.181 m  AORTA Ao Root diam: 3.00 cm Ao Asc diam:  3.00 cm MITRAL VALVE MV Area (PHT): 2.17 cm    SHUNTS MV Decel Time: 349 msec    Systemic VTI:  0.18 m MV E velocity: 58.90 cm/s  Systemic Diam: 1.70 cm MV A velocity: 90.00 cm/s MV E/A ratio:  0.65 Rudean Haskell MD  Electronically signed by Rudean Haskell MD Signature Date/Time: 12/27/2020/4:40:12 PM    Final    CT Renal Stone Study  Result Date: 12/27/2020 CLINICAL DATA:  Flank pain, stone disease suspected EXAM: CT ABDOMEN AND PELVIS WITHOUT CONTRAST TECHNIQUE: Multidetector CT imaging of the abdomen and pelvis was performed following the standard protocol without IV contrast. COMPARISON:  CT 05/03/2003 FINDINGS: Lower chest: Lung bases are clear. Normal heart size. No pericardial effusion. Few calcifications on the mitral annulus. Coronary artery calcifications and distal thoracic aortic  calcifications are present as well. Hypoattenuation of the cardiac blood pool, suggestive of anemia. Hepatobiliary: No visible focal liver lesion within limitations of this unenhanced CT. Normal liver attenuation. Smooth liver surface contour. Normal gallbladder and biliary tree without visible calcified gallstone. Pancreas: No pancreatic ductal dilatation or surrounding inflammatory changes. Spleen: Normal in size. No concerning splenic lesions. Punctate calcification in the spleen may be vascular or related to prior granulomatous disease (3/22) normal splenic size. No worrisome splenic lesions. Adrenals/Urinary Tract: Normal adrenal glands. Since 2004, there has been development of numerous cysts throughout the kidneys, while many are fluid attenuation, there are several more hyperdense cysts including a 10 mm partially exophytic cyst in the interpolar right kidney (3/35 and a cyst with some layering hyperattenuation in the upper pole left kidney measuring 1.8 cm (3/27). Additionally, there is a more conspicuous almost solid-appearing lesion arising from the upper pole right kidney which measures approximately 2.5 x 2.2 x 2.8 cm, incompletely characterized on this unenhanced exam. Few punctate nonobstructing calculi are present in both kidneys. No obstructive urolithiasis is seen though there is slight asymmetric prominence of the left renal pelvis and proximal ureter without visible obstructing calculus or lesion. Multiple vascular calcifications are noted as well. Stomach/Bowel: Sliding-type hiatal hernia. Distal stomach and duodenum are unremarkable. No small bowel thickening or dilatation. No colonic dilatation or wall thickening. High attenuation enteric contrast media traverses part way through the transverse colon. No evidence of bowel obstruction. The appendix is not visualized. Correlate for history of appendectomy. No focal inflammation the vicinity of the cecum to suggest an occult appendicitis.  Vascular/Lymphatic: Atherosclerotic calcifications within the abdominal aorta and branch vessels. Focal fusiform infrarenal abdominal aortic dilatation to 2.6 cm in diameter (6/47) arising approximately 1 cm below the renal artery ostia. No other aneurysm or ectasia. 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 No other major venous abnormalities. No enlarged abdominopelvic lymph nodes. Reproductive: Normal appearance of the uterus and adnexal structures. Other: No abdominopelvic free fluid or free gas. No bowel containing hernias. Musculoskeletal: Multilevel degenerative changes are present in the imaged portions of the spine. No acute osseous abnormality or suspicious osseous lesion. IMPRESSION: 1. Slight asymmetric prominence in stranding of the left renal pelvis and proximal ureter without visible obstructing calculus or lesion. Findings could reflect recently passed stone or infection. Correlate with patient's symptoms and urinalysis. No obstructive urolithiasis or frank hydronephrosis at this time. 2. Interval development of numerous cysts throughout the kidneys, while many are fluid attenuation, there are several more hyperdense which could reflect hemorrhagic or proteinaceous cyst. A more conspicuous solid-appearing lesion arising from the upper pole right kidney which measures approximately 2.5 x 2.2 x 2.8 cm, incompletely characterized on this unenhanced exam. Recommend further evaluation with nonemergent renal ultrasound. 3. Sliding-type hiatal hernia. 4. Hypoattenuation of the cardiac blood pool, suggestive of anemia. 5. Focal infrarenal abdominal aortic dilatation to 2.6 cm. Recommend follow-up ultrasound every 5 years. This recommendation follows ACR consensus  guidelines: White Paper of the ACR Incidental Findings Committee II on Vascular Findings. J Am Coll Radiol 2013; 10:789-794. 6. Aortic Atherosclerosis (ICD10-I70.0). These results were called by telephone at the  time of interpretation on 12/27/2020 at 12:53 am to provider [facility] , who verbally acknowledged these results. Electronically Signed   By: Lovena Le M.D.   On: 12/27/2020 00:53   VAS [place] LOWER EXTREMITY VENOUS (DVT)  Result Date: 12/30/2020  Lower Venous DVT Study Indications: Edema.  Comparison Study: no prior Performing Technologist: Abram Sander RVS  Examination Guidelines: A complete evaluation includes B-mode imaging, spectral Doppler, color Doppler, and power Doppler as needed of all accessible portions of each vessel. Bilateral testing is considered an integral part of a complete examination. Limited examinations for reoccurring indications may be performed as noted. The reflux portion of the exam is performed with the patient in reverse Trendelenburg.  +-----+---------------+---------+-----------+----------+--------------+ RIGHTCompressibilityPhasicitySpontaneityPropertiesThrombus Aging +-----+---------------+---------+-----------+----------+--------------+ CFV  Full           Yes      Yes                                 +-----+---------------+---------+-----------+----------+--------------+   +---------+---------------+---------+-----------+----------+--------------+ LEFT     CompressibilityPhasicitySpontaneityPropertiesThrombus Aging +---------+---------------+---------+-----------+----------+--------------+ CFV      Full           Yes      Yes                                 +---------+---------------+---------+-----------+----------+--------------+ SFJ      Full                                                        +---------+---------------+---------+-----------+----------+--------------+ FV Prox  Full                                                        +---------+---------------+---------+-----------+----------+--------------+ FV Mid   Full                                                         +---------+---------------+---------+-----------+----------+--------------+ FV DistalFull                                                        +---------+---------------+---------+-----------+----------+--------------+ PFV      Full                                                        +---------+---------------+---------+-----------+----------+--------------+ POP      Full  Yes      Yes                                 +---------+---------------+---------+-----------+----------+--------------+ PTV      Full                                                        +---------+---------------+---------+-----------+----------+--------------+ PERO     Full                                                        +---------+---------------+---------+-----------+----------+--------------+     Summary: RIGHT: - No evidence of common femoral vein obstruction.  LEFT: - There is no evidence of deep vein thrombosis in the lower extremity.  - No cystic structure found in the popliteal fossa.  *See table(s) above for measurements and observations. Electronically signed by Servando Snare MD on 12/30/2020 at 7:22:56 AM.    Final    VAS [place] LOWER EXTREMITY VENOUS (DVT)  Result Date: 12/20/2020  Lower Venous DVT Study Indications: Patient reports right calf pain x 2 weeks. She denies any SOB.  Risk Factors: None identified. Comparison Study: NA Performing Technologist: Sharlett Iles RVT  Examination Guidelines: A complete evaluation includes B-mode imaging, spectral Doppler, color Doppler, and power Doppler as needed of all accessible portions of each vessel. Bilateral testing is considered an integral part of a complete examination. Limited examinations for reoccurring indications may be performed as noted. The reflux portion of the exam is performed with the patient in reverse Trendelenburg.  +---------+---------------+---------+-----------+---------------+--------------+ RIGHT     CompressibilityPhasicitySpontaneityProperties     Thrombus Aging +---------+---------------+---------+-----------+---------------+--------------+ CFV      Full           Yes      Yes                                      +---------+---------------+---------+-----------+---------------+--------------+ SFJ      Full           Yes      Yes                                      +---------+---------------+---------+-----------+---------------+--------------+ FV Prox  Full           Yes      Yes                                      +---------+---------------+---------+-----------+---------------+--------------+ FV Mid   Full                                                             +---------+---------------+---------+-----------+---------------+--------------+ FV DistalFull  Yes      Yes                                      +---------+---------------+---------+-----------+---------------+--------------+ PFV      Full           Yes      Yes                                      +---------+---------------+---------+-----------+---------------+--------------+ POP      Full           Yes      Yes                                      +---------+---------------+---------+-----------+---------------+--------------+ PTV      Full           Yes      Yes                                      +---------+---------------+---------+-----------+---------------+--------------+ PERO     Full           Yes      Yes                                      +---------+---------------+---------+-----------+---------------+--------------+ Soleal   Partial        Yes      Yes        softly         Acute                                                      echogenic and                                                             dilated at .64                                                            cm                             +---------+---------------+---------+-----------+---------------+--------------+ Gastroc  Full                                                             +---------+---------------+---------+-----------+---------------+--------------+   +---------+---------------+---------+-----------+----------+--------------+  LEFT     CompressibilityPhasicitySpontaneityPropertiesThrombus Aging +---------+---------------+---------+-----------+----------+--------------+ CFV      Full           Yes      Yes                                 +---------+---------------+---------+-----------+----------+--------------+ SFJ      Full           Yes      Yes                                 +---------+---------------+---------+-----------+----------+--------------+ FV Prox  Full           Yes      Yes                                 +---------+---------------+---------+-----------+----------+--------------+ FV Mid   Full           Yes      Yes                                 +---------+---------------+---------+-----------+----------+--------------+ FV DistalFull           Yes      Yes                                 +---------+---------------+---------+-----------+----------+--------------+ PFV      Full           Yes      Yes                                 +---------+---------------+---------+-----------+----------+--------------+ POP      Full           Yes      Yes                                 +---------+---------------+---------+-----------+----------+--------------+ PTV      Full           Yes      Yes                                 +---------+---------------+---------+-----------+----------+--------------+ PERO     Full           Yes      Yes                                 +---------+---------------+---------+-----------+----------+--------------+ Soleal   Full           Yes      Yes                                  +---------+---------------+---------+-----------+----------+--------------+ Gastroc  NV             Yes      Yes                                 +---------+---------------+---------+-----------+----------+--------------+  GSV      Full           Yes      Yes                                 +---------+---------------+---------+-----------+----------+--------------+ SSV      Full           Yes      Yes                                 +---------+---------------+---------+-----------+----------+--------------+    Findings reported to Mickel Baas at 2:45 pm.  Summary: RIGHT: - Findings consistent with acute deep vein thrombosis involving the right soleal veins. - Dilated soleal vein, measuring .64 cm. - No cystic structure found in the popliteal fossa. - Great saphenous vein not visualized.  LEFT: - No cystic structure found in the popliteal fossa.  *See table(s) above for measurements and observations. Electronically signed by Larae Grooms MD on 12/20/2020 at 10:26:40 PM.    Final       Discharge Exam: Vitals:   01/16/21 0748 01/16/21 0755  BP: (!) 147/54   Pulse: 67 71  Resp: 18 17  Temp: 98.4 F (36.9 C)   SpO2: 94% 92%   Vitals:   01/16/21 0406 01/16/21 0636 01/16/21 0748 01/16/21 0755  BP: 133/63 136/64 (!) 147/54   Pulse:  70 67 71  Resp: 20 18 18 17   Temp:  98.9 F (37.2 C) 98.4 F (36.9 C)   TempSrc:  Oral Oral   SpO2: 99% 99% 94% 92%  Weight:      Height:        General: Pt is alert, awake, not in acute distress Cardiovascular: RRR, S1/S2 +, no rubs, no gallops Respiratory: CTA bilaterally, no wheezing, no rhonchi Abdominal: Soft, NT, ND, bowel sounds + Extremities: no edema, no cyanosis    The results of significant diagnostics from this hospitalization (including imaging, microbiology, ancillary and laboratory) are listed below for reference.     Microbiology: Recent Results (from the past 240 hour(s))  SARS Coronavirus 2 by RT PCR (hospital order,  performed in [facility] hospital lab) Nasopharyngeal Nasopharyngeal Swab     Status: None   Collection Time: 01/13/21  8:27 PM   Specimen: Nasopharyngeal Swab  Result Value Ref Range Status   SARS Coronavirus 2 NEGATIVE NEGATIVE Final    Comment: (NOTE) SARS-CoV-2 target nucleic acids are NOT DETECTED.  The SARS-CoV-2 RNA is generally detectable in upper and lower respiratory specimens during the acute phase of infection. The lowest concentration of SARS-CoV-2 viral copies this assay can detect is 250 copies / mL. A negative result does not preclude SARS-CoV-2 infection and should not be used as the sole basis for treatment or other patient management decisions.  A negative result may occur with improper specimen collection / handling, submission of specimen other than nasopharyngeal swab, presence of viral mutation(s) within the areas targeted by this assay, and inadequate number of viral copies (<250 copies / mL). A negative result must be combined with clinical observations, patient history, and epidemiological information.  Fact Sheet for Patients:   StrictlyIdeas.no  Fact Sheet for Healthcare Providers: BankingDealers.co.za  This test is not yet approved or  cleared by the [place] FDA and has been authorized for detection and/or diagnosis of SARS-CoV-2 by FDA under an Emergency Use Authorization (  EUA).  This EUA will remain in effect (meaning this test can be used) for the duration of the COVID-19 declaration under Section 564(b)(1) of the Act, 21 U.S.C. section 360bbb-3(b)(1), unless the authorization is terminated or revoked sooner.  Performed at [facility], [place] [address], [place], [place] [address]      Labs: BNP (last 3 results) No results for input(s): BNP in the last 8760 hours. Basic Metabolic Panel: Recent Labs  Lab 01/13/21 1515 01/13/21 1905 01/14/21 0002 01/14/21 0311 01/14/21 1752  01/15/21 0323 01/16/21 0309  NA 132*  --  132* 134* 136 137 140  K >7.5*   < > 6.7* 5.9* 4.7 3.8 3.4*  CL 109  --  113* 112* 108 103 100  CO2 13*  --  10* 12* 13* 20* 27  GLUCOSE 134*  --  132* 115* 164* 115* 100*  BUN 46*  --  43* 43* 40* 46* 37*  CREATININE 3.79*  --  3.41* 3.28* 2.93* 2.91* 2.57*  CALCIUM 9.6  --  9.8 10.0 8.9 9.5 8.9  MG 1.7  --   --  1.6*  --  1.9 1.7  PHOS  --   --  5.4* 6.0* 7.4*  --   --    < > = values in this interval not displayed.   Liver Function Tests: Recent Labs  Lab 01/13/21 1037 01/14/21 0002 01/14/21 0311 01/14/21 1752  AST 20  --  21  --   ALT 18  --  19  --   ALKPHOS 57  --  42  --   BILITOT 0.3  --  0.6  --   PROT 7.4  --  6.0*  --   ALBUMIN 4.4 3.4* 3.4* 3.2*   No results for input(s): LIPASE, AMYLASE in the last 168 hours. No results for input(s): AMMONIA in the last 168 hours. CBC: Recent Labs  Lab 01/13/21 1515 01/14/21 0311 01/15/21 0323 01/16/21 0309  WBC 10.1 9.6 9.1 6.0  NEUTROABS  --   --  5.9 3.6  HGB 10.1* 9.0* 8.5* 7.8*  HCT 32.2* 27.9* 26.3* 22.6*  MCV 90.7 90.6 87.4 84.0  PLT 341 258 241 215   Cardiac Enzymes: No results for input(s): CKTOTAL, CKMB, CKMBINDEX, TROPONINI in the last 168 hours. BNP: Invalid input(s): POCBNP CBG: No results for input(s): GLUCAP in the last 168 hours. D-Dimer No results for input(s): DDIMER in the last 72 hours. Hgb A1c No results for input(s): HGBA1C in the last 72 hours. Lipid Profile No results for input(s): CHOL, HDL, LDLCALC, TRIG, CHOLHDL, LDLDIRECT in the last 72 hours. Thyroid function studies Recent Labs    01/14/21 0311  TSH 3.879   Anemia work up Recent Labs    01/14/21 0002  FERRITIN 199  TIBC 270  IRON 65   Urinalysis    Component Value Date/Time   COLORURINE STRAW (A) 01/13/2021 2312   APPEARANCEUR CLEAR 01/13/2021 2312   LABSPEC 1.005 01/13/2021 2312   PHURINE 5.0 01/13/2021 2312   GLUCOSEU NEGATIVE 01/13/2021 2312   GLUCOSEU NEGATIVE  09/19/2020 1432   HGBUR NEGATIVE 01/13/2021 2312   BILIRUBINUR NEGATIVE 01/13/2021 2312   BILIRUBINUR Negative 01/19/2019 [place] 01/13/2021 2312   PROTEINUR 30 (A) 01/13/2021 2312   UROBILINOGEN 0.2 09/19/2020 1432   NITRITE NEGATIVE 01/13/2021 2312   LEUKOCYTESUR NEGATIVE 01/13/2021 2312   Sepsis Labs Invalid input(s): PROCALCITONIN,  WBC,  LACTICIDVEN Microbiology Recent Results (from the past 240 hour(s))  SARS Coronavirus  2 by RT PCR (hospital order, performed in [facility] hospital lab) Nasopharyngeal Nasopharyngeal Swab     Status: None   Collection Time: 01/13/21  8:27 PM   Specimen: Nasopharyngeal Swab  Result Value Ref Range Status   SARS Coronavirus 2 NEGATIVE NEGATIVE Final    Comment: (NOTE) SARS-CoV-2 target nucleic acids are NOT DETECTED.  The SARS-CoV-2 RNA is generally detectable in upper and lower respiratory specimens during the acute phase of infection. The lowest concentration of SARS-CoV-2 viral copies this assay can detect is 250 copies / mL. A negative result does not preclude SARS-CoV-2 infection and should not be used as the sole basis for treatment or other patient management decisions.  A negative result may occur with improper specimen collection / handling, submission of specimen other than nasopharyngeal swab, presence of viral mutation(s) within the areas targeted by this assay, and inadequate number of viral copies (<250 copies / mL). A negative result must be combined with clinical observations, patient history, and epidemiological information.  Fact Sheet for Patients:   StrictlyIdeas.no  Fact Sheet for Healthcare Providers: BankingDealers.co.za  This test is not yet approved or  cleared by the [place] FDA and has been authorized for detection and/or diagnosis of SARS-CoV-2 by FDA under an Emergency Use Authorization (EUA).  This EUA will remain in effect (meaning this  test can be used) for the duration of the COVID-19 declaration under Section 564(b)(1) of the Act, 21 U.S.C. section 360bbb-3(b)(1), unless the authorization is terminated or revoked sooner.  Performed at [facility], [place] [address], [place], [place] [address]      Time coordinating discharge: Over 30 minutes  SIGNED:   Darliss Cheney, MD  [facility] 01/16/2021, 10:41 AM  If 7PM-7AM, please contact night-coverage [email or web address]

## 2021-01-16 NOTE — Progress Notes (Signed)
Subjective:    925 UOP recorded -  crt improved again from 2.9 to 2.5.  BP better today [name] -  She is without complaint  Objective Vital signs in last 24 hours: Vitals:   01/16/21 0406 01/16/21 0636 01/16/21 0748 01/16/21 0755  BP: 133/63 136/64 (!) 147/54   Pulse:  70 67 71  Resp: 20 18 18 17   Temp:  98.9 F (37.2 C) 98.4 F (36.9 C)   TempSrc:  Oral Oral   SpO2: 99% 99% 94% 92%  Weight:      Height:       Weight change: -0.181 kg  Intake/Output Summary (Last 24 hours) at 01/16/2021 6720 Last data filed at 01/16/2021 0345 Gross per 24 hour  Intake 1656.96 ml  Output 925 ml  Net 731.96 ml     Assessment/Recommendations:  75 y.o. female with uncontrolled HTN, CKD4, laryngeal Ca s/p laryngectomy, h/o recent NSTEMI presents with AKI and hyperkalemia.  AKI on CKD4:  -underlying CKD likely related to hypertensive arterionephrosclerosis (BL Cr around 1.7-1.9) -Suspecting her AKI is multifactorial: Prerenal injury in the setting of nausea/vomiting/diarrhea and possible "normotensive" AKI   no urgent indications for renal replacement therapy  -UA w/ sediment unremarkable - renal u/s- no obstruction-  Many simple cysts and concerning lesion left kidney- see plan below   Does not appear wet to me     Hypertension: -uncontrolled,  resuming her home medications, metoprolol, imdur really and hydralazine- will decrease dose and keep parameters- aim for SBP of 130-150. I do suspect anxiety is playing a major role here.  BP is perfect for her right now   Hyperkalemia. resolved  Hyponatremia -Improved   Anemia of chronic kidney disease -Hgb dropping pretty quickly -  Need work up ?  -Transfuse for Hgb<7 g/dL - iron panel low, give feraheme and give one dose of ESA while here but will not act instantaneously   Possible renal mass-  rec non emergent MRI-  Done-  They recommend repeat MRI in 6 mos   Metabolic acidosis-  Stop bicarb  Dispo-  I would be OK with discharge from  renal standpoint, not sure about dec hgb issue-  She already had follow up with [facility] scheduled for next week     [name]    Labs: Basic Metabolic Panel: Recent Labs  Lab 01/14/21 0002 01/14/21 0311 01/14/21 1752 01/15/21 0323 01/16/21 0309  NA 132* 134* 136 137 140  K 6.7* 5.9* 4.7 3.8 3.4*  CL 113* 112* 108 103 100  CO2 10* 12* 13* 20* 27  GLUCOSE 132* 115* 164* 115* 100*  BUN 43* 43* 40* 46* 37*  CREATININE 3.41* 3.28* 2.93* 2.91* 2.57*  CALCIUM 9.8 10.0 8.9 9.5 8.9  PHOS 5.4* 6.0* 7.4*  --   --    Liver Function Tests: Recent Labs  Lab 01/13/21 1037 01/14/21 0002 01/14/21 0311 01/14/21 1752  AST 20  --  21  --   ALT 18  --  19  --   ALKPHOS 57  --  42  --   BILITOT 0.3  --  0.6  --   PROT 7.4  --  6.0*  --   ALBUMIN 4.4 3.4* 3.4* 3.2*   No results for input(s): LIPASE, AMYLASE in the last 168 hours. No results for input(s): AMMONIA in the last 168 hours. CBC: Recent Labs  Lab 01/13/21 1515 01/14/21 0311 01/15/21 0323 01/16/21 0309  WBC 10.1 9.6 9.1 6.0  NEUTROABS  --   --  5.9 3.6  HGB 10.1* 9.0* 8.5* 7.8*  HCT 32.2* 27.9* 26.3* 22.6*  MCV 90.7 90.6 87.4 84.0  PLT 341 258 241 215   Cardiac Enzymes: No results for input(s): CKTOTAL, CKMB, CKMBINDEX, TROPONINI in the last 168 hours. CBG: No results for input(s): GLUCAP in the last 168 hours.  Iron Studies:  Recent Labs    01/14/21 0002  IRON 65  TIBC 270  FERRITIN 199   Studies/Results: MR ABDOMEN WO CONTRAST  Result Date: 01/16/2021 CLINICAL DATA:  Evaluate left renal lesion seen on recent renal ultrasound and right renal lesions seen on recent CT scan. EXAM: MRI ABDOMEN WITHOUT CONTRAST TECHNIQUE: Multiplanar multisequence MR imaging was performed without the administration of intravenous contrast. COMPARISON:  CT scan 12/27/2020 and renal ultrasound 01/14/2021 FINDINGS: Examination is quite limited without IV contrast which is usually fairly critical for evaluation of renal  masses. Lower chest: The lung bases are grossly clear. No pulmonary lesions or pleural effusion. The heart is normal in size. No pericardial effusion. Hepatobiliary: No hepatic lesions are identified without contrast. No intrahepatic biliary dilatation. The gallbladder appears normal. No common bile duct dilatation. Pancreas: 5 mm cystic structures noted in the pancreatic body. This is likely benign but difficult to identify on prior CT scans. No acute inflammation or ductal dilatation. Spleen:  Normal size.  No cul lesions. Adrenals/Urinary Tract:  The adrenal glands are unremarkable. There are numerous bilateral simple renal cysts and numerous hemorrhagic or proteinaceous cysts. The upper pole right renal lesions seen on the recent CT scan corresponds to a 2.6 cm hemorrhagic cyst. Midpole left renal lesion laterally measures 2.2 x 1.8 cm and appears to correlate with the abnormality seen on the ultrasound. It has heterogeneous low T2 and high T2 signal intensity in a few minimal areas of dependent increased T1 signal intensity. Findings worrisome for a solid renal neoplasm. Difficult to be certain without contrast however. Stomach/Bowel: The stomach, duodenum, visualized small bowel and visualized colon are grossly normal. Vascular/Lymphatic: Advanced vascular disease without focal aneurysm. No mesenteric or retroperitoneal mass or adenopathy. Other:  No ascites or abdominal wall hernia. Musculoskeletal: No significant bony findings. IMPRESSION: 1. Multiple bilateral renal lesions. Most of these are either simple cysts or hemorrhagic/proteinaceous cysts. 2. The upper pole right renal lesions seen on the recent CT scan corresponds to a benign hemorrhagic cyst. 3. The left midpole renal lesion laterally, seen on the ultrasound examination, corresponds to an indeterminate lesion. This will require surveillance. Recommend follow-up MR examination in 6 months. 4. No other significant abdominal findings, mass lesions or  adenopathy. 5. Advanced vascular disease. Electronically Signed   By: Marijo Sanes M.D.   On: 01/16/2021 07:54   ECHOCARDIOGRAM COMPLETE  Result Date: 01/15/2021    ECHOCARDIOGRAM REPORT   Patient Name:   [name] Date of Exam: 01/15/2021 Medical Rec #:  [id number]        Height:       64.0 in Accession #:    [id number]       Weight:       120.3 lb Date of Birth:  11-21-1946       BSA:          1.576 m Patient Age:    42 years         BP:           109/57 mmHg Patient Gender: F                HR:  76 bpm. Exam Location:  Inpatient Procedure: 2D Echo, Color Doppler and Cardiac Doppler Indications:    Chest Pain at Rest  History:        Patient has prior history of Echocardiogram examinations, most                 recent 12/27/2020. Stroke and COPD, Signs/Symptoms:Shortness of                 Breath and Dyspnea; Risk Factors:Hypertension.  Sonographer:    Bernadene Person RDCS Referring Phys: [id number] RAVI [place]  1. Left ventricular ejection fraction, by estimation, is 70 to 75%. The left ventricle has hyperdynamic function. The left ventricle has no regional wall motion abnormalities. There is mild left ventricular hypertrophy. Left ventricular diastolic parameters are indeterminate.  2. Right ventricular systolic function is normal. The right ventricular size is normal. There is normal pulmonary artery systolic pressure.  3. The mitral valve is abnormal. No evidence of mitral valve regurgitation. No evidence of mitral stenosis. Moderate mitral annular calcification.  4. The aortic valve was not well visualized. Aortic valve regurgitation is not visualized. No aortic stenosis is present.  5. The inferior vena cava is normal in size with greater than 50% respiratory variability, suggesting right atrial pressure of 3 mmHg. FINDINGS  Left Ventricle: Left ventricular ejection fraction, by estimation, is 70 to 75%. The left ventricle has hyperdynamic function. The left ventricle has no  regional wall motion abnormalities. The left ventricular internal cavity size was normal in size. There is mild left ventricular hypertrophy. Left ventricular diastolic parameters are indeterminate. Right Ventricle: The right ventricular size is normal. No increase in right ventricular wall thickness. Right ventricular systolic function is normal. There is normal pulmonary artery systolic pressure. The tricuspid regurgitant velocity is 1.83 m/s, and  with an assumed right atrial pressure of 3 mmHg, the estimated right ventricular systolic pressure is 92.3 mmHg. Left Atrium: Left atrial size was normal in size. Right Atrium: Right atrial size was normal in size. Pericardium: Trivial pericardial effusion is present. Mitral Valve: The mitral valve is abnormal. Moderate mitral annular calcification. No evidence of mitral valve regurgitation. No evidence of mitral valve stenosis. Tricuspid Valve: The tricuspid valve is normal in structure. Tricuspid valve regurgitation is trivial. Aortic Valve: The aortic valve was not well visualized. Aortic valve regurgitation is not visualized. No aortic stenosis is present. Pulmonic Valve: The pulmonic valve was not well visualized. Pulmonic valve regurgitation is trivial. Aorta: The aortic root and ascending aorta are structurally normal, with no evidence of dilitation. Venous: The inferior vena cava is normal in size with greater than 50% respiratory variability, suggesting right atrial pressure of 3 mmHg. IAS/Shunts: The interatrial septum was not well visualized.  LEFT VENTRICLE PLAX 2D LVIDd:         3.60 cm  Diastology LVIDs:         1.80 cm  LV e' medial:    6.22 cm/s LV PW:         1.10 cm  LV E/e' medial:  11.4 LV IVS:        1.10 cm  LV e' lateral:   7.53 cm/s LVOT diam:     1.90 cm  LV E/e' lateral: 9.4 LV SV:         72 LV SV Index:   46 LVOT Area:     2.84 cm  RIGHT VENTRICLE RV S prime:     16.00 cm/s TAPSE (M-mode): 2.2 cm LEFT ATRIUM  Index       RIGHT  ATRIUM           Index LA diam:        2.70 cm 1.71 cm/m  RA Area:     12.60 cm LA Vol (A2C):   48.7 ml 30.90 ml/m RA Volume:   28.30 ml  17.95 ml/m LA Vol (A4C):   43.0 ml 27.28 ml/m LA Biplane Vol: 46.1 ml 29.25 ml/m  AORTIC VALVE LVOT Vmax:   119.00 cm/s LVOT Vmean:  86.200 cm/s LVOT VTI:    0.254 m  AORTA Ao Root diam: 2.90 cm Ao Asc diam:  2.50 cm MITRAL VALVE               TRICUSPID VALVE MV Area (PHT): 2.83 cm    TR Peak grad:   13.4 mmHg MV Decel Time: 268 msec    TR Vmax:        183.00 cm/s MV E velocity: 71.10 cm/s MV A velocity: 84.40 cm/s  SHUNTS MV E/A ratio:  0.84        Systemic VTI:  0.25 m                            Systemic Diam: 1.90 cm Oswaldo Milian MD Electronically signed by Oswaldo Milian MD Signature Date/Time: 01/15/2021/10:15:27 PM    Final    Medications: Infusions: . sodium bicarbonate (isotonic) 150 mEq in D5W 1000 mL infusion Stopped (01/16/21 0136)    Scheduled Medications: . apixaban  5 mg Oral BID  . cilostazol  100 mg Oral BID  . escitalopram  20 mg Oral Daily  . famotidine  20 mg Oral q AM  . fluticasone  2 spray Each Nare Daily  . hydrALAZINE  25 mg Oral TID  . isosorbide mononitrate  30 mg Oral Daily  . levothyroxine  50 mcg Oral QAC breakfast  . LORazepam  1 mg Oral BID  . mouth rinse  15 mL Mouth Rinse BID  . metoprolol tartrate  50 mg Oral BID  . potassium chloride  40 mEq Oral Once  . rosuvastatin  20 mg Oral Daily    have reviewed scheduled and prn medications.  Physical Exam: Gen, she is happy that kidney function is improved - daughter in law to translate  Heart:RRR Lungs: clear Abdomen: soft, non tender Extremities: no edema   01/16/2021,9:24 AM  LOS: 3 days

## 2021-01-16 NOTE — TOC Initial Note (Signed)
Transition of Care [facility]) - Initial/Assessment Note    Patient Details  Name: [name] MRN: [id number] Date of Birth: 1946/07/24  [facility] CM/SW Contact:    Zenon Mayo, RN Phone Number: 01/16/2021, 10:03 AM  Clinical Narrative:                 Patient lives with daughter, she is for dc today, daughter states she will transport her home and that she has no DME at home and does not need any DME.  Daughter states she transports patient to her MD apts and gets her medications for her as well.   Expected Discharge Plan: Home/Self Care Barriers to Discharge: No Barriers Identified   Patient Goals and CMS Choice     Choice offered to / list presented to : NA  Expected Discharge Plan and Services Expected Discharge Plan: Home/Self Care   Discharge Planning Services: CM Consult Post Acute Care Choice: NA Living arrangements for the past 2 months: Single Family Home Expected Discharge Date: 01/16/21                 DME Agency: NA       HH Arranged: NA          Prior Living Arrangements/Services Living arrangements for the past 2 months: Single Family Home Lives with:: Adult Children Patient language and need for interpreter reviewed:: Yes Do you feel safe going back to the place where you live?: Yes      Need for Family Participation in Patient Care: Yes (Comment) Care giver support system in place?: Yes (comment)   Criminal Activity/Legal Involvement Pertinent to Current Situation/Hospitalization: No - Comment as needed  Activities of Daily Living Home Assistive Devices/Equipment: None ADL Screening (condition at time of admission) Patient's cognitive ability adequate to safely complete daily activities?: Yes Is the patient deaf or have difficulty hearing?: No Does the patient have difficulty seeing, even when wearing glasses/contacts?: No Does the patient have difficulty concentrating, remembering, or making decisions?: No Patient  able to express need for assistance with ADLs?: Yes Does the patient have difficulty dressing or bathing?: No Independently performs ADLs?: Yes (appropriate for developmental age) Does the patient have difficulty walking or climbing stairs?: No Weakness of Legs: None Weakness of Arms/Hands: None  Permission Sought/Granted                  Emotional Assessment         Alcohol / Substance Use: Not Applicable Psych Involvement: No (comment)  Admission diagnosis:  Hyperkalemia [E87.5] AKI (acute kidney injury) ([place]) [N17.9] Patient Active Problem List   Diagnosis Date Noted  . Hyperkalemia 01/13/2021  . HLD (hyperlipidemia) 01/13/2021  . Elevated troponin 12/27/2020  . ACS (acute coronary syndrome) ([place]) 12/27/2020  . Other specified hypothyroidism 02/25/2020  . Senile osteoporosis 02/15/2020  . PAD (peripheral artery disease) ([place]) 02/15/2020  . Tinea corporis 02/15/2020  . Gastroesophageal reflux disease without esophagitis 09/09/2019  . Screen for colon cancer 01/19/2019  . Osteopenia 01/19/2019  . Breast cancer screening by mammogram 01/19/2019  . CKD stage 4 secondary to hypertension ([place]) 01/19/2019  . Thiamine deficiency 01/02/2018  . Pure hyperglyceridemia 12/29/2017  . Prediabetes 12/28/2017  . Hyperlipidemia LDL goal <130 12/28/2017  . COPD (chronic obstructive pulmonary disease) with chronic bronchitis ([place]) 12/28/2017  . Dietary folate deficiency anemia 12/28/2017  . Moderate episode of recurrent major depressive disorder ([place]) 10/22/2017  . Dysphagia, pharyngoesophageal phase 05/22/2014  . History of radiation therapy   .  Chronic pain syndrome 09/03/2012  . Tobacco abuse 08/10/2012  . AKI (acute kidney injury) ([place]) 08/10/2012  . COPD (chronic obstructive pulmonary disease) ([place]) 08/10/2012  . 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 08/08/2012    Class: Chronic  . Hypertension   . GAD (generalized anxiety disorder)    PCP:  Janith Lima, MD Pharmacy:   [id number] - [place], [address] [place] [address] Phone: [phone] Fax: [phone]     Social Determinants of Health (SDOH) Interventions Food Insecurity Interventions: Intervention Not Indicated Social Connections Interventions: Intervention Not Indicated Transportation Interventions: Intervention Not Indicated  Readmission Risk Interventions Readmission Risk Prevention Plan 01/16/2021 01/02/2021  Transportation Screening Complete Complete  PCP or Specialist Appt within 3-5 Days Complete Complete  HRI or Home Care Consult Complete Complete  Social Work Consult for [place] Planning/Counseling Complete Complete  Palliative Care Screening Not Applicable Not Applicable  Medication Review Press photographer) Complete Complete  Some recent data might be hidden

## 2021-01-21 ENCOUNTER — Other Ambulatory Visit: Payer: Self-pay | Admitting: Internal Medicine

## 2021-01-21 ENCOUNTER — Inpatient Hospital Stay: Payer: Medicare Other | Admitting: Internal Medicine

## 2021-01-21 DIAGNOSIS — I10 Essential (primary) hypertension: Secondary | ICD-10-CM

## 2021-01-22 DIAGNOSIS — N2889 Other specified disorders of kidney and ureter: Secondary | ICD-10-CM | POA: Diagnosis not present

## 2021-01-22 DIAGNOSIS — R809 Proteinuria, unspecified: Secondary | ICD-10-CM | POA: Diagnosis not present

## 2021-01-22 DIAGNOSIS — D631 Anemia in chronic kidney disease: Secondary | ICD-10-CM | POA: Diagnosis not present

## 2021-01-22 DIAGNOSIS — N184 Chronic kidney disease, stage 4 (severe): Secondary | ICD-10-CM | POA: Diagnosis not present

## 2021-01-22 DIAGNOSIS — I129 Hypertensive chronic kidney disease with stage 1 through stage 4 chronic kidney disease, or unspecified chronic kidney disease: Secondary | ICD-10-CM | POA: Diagnosis not present

## 2021-01-22 DIAGNOSIS — I214 Non-ST elevation (NSTEMI) myocardial infarction: Secondary | ICD-10-CM | POA: Diagnosis not present

## 2021-01-22 DIAGNOSIS — N189 Chronic kidney disease, unspecified: Secondary | ICD-10-CM | POA: Diagnosis not present

## 2021-01-22 DIAGNOSIS — E875 Hyperkalemia: Secondary | ICD-10-CM | POA: Diagnosis not present

## 2021-01-22 DIAGNOSIS — N2581 Secondary hyperparathyroidism of renal origin: Secondary | ICD-10-CM | POA: Diagnosis not present

## 2021-01-22 DIAGNOSIS — N179 Acute kidney failure, unspecified: Secondary | ICD-10-CM | POA: Diagnosis not present

## 2021-01-22 LAB — COMPREHENSIVE METABOLIC PANEL
Albumin: 4.1 (ref 3.5–5.0)
Calcium: 8.7 (ref 8.7–10.7)
GFR calc Af Amer: 25
GFR calc non Af Amer: 21

## 2021-01-22 LAB — BASIC METABOLIC PANEL
BUN: 33 — AB (ref 4–21)
CO2: 23 — AB (ref 13–22)
Chloride: 108 (ref 99–108)
Creatinine: 2.2 — AB (ref 0.5–1.1)
Glucose: 88
Potassium: 5.6 — AB (ref 3.4–5.3)
Sodium: 140 (ref 137–147)

## 2021-01-22 LAB — CBC AND DIFFERENTIAL
HCT: 27 — AB (ref 36–46)
Hemoglobin: 8.4 — AB (ref 12.0–16.0)
Neutrophils Absolute: 4
Platelets: 206 (ref 150–399)
WBC: 5.7

## 2021-01-22 LAB — IRON,TIBC AND FERRITIN PANEL
%SAT: 26
Ferritin: 950
TIBC: 225
UIBC: 167

## 2021-01-22 LAB — CBC: RBC: 2.99 — AB (ref 3.87–5.11)

## 2021-01-22 NOTE — Progress Notes (Signed)
Cardiology Office Note    Date:  01/28/2021   ID:  [name], DOB 11-Jan-1946, MRN [id number]  PCP:  Hoyt Koch, MD  Cardiologist: Mertie Moores, MD EPS: None  Chief Complaint  Patient presents with  . Hospitalization Follow-up    History of Present Illness:  [name] is a 75 y.o. 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   NSTEMI managed medically(12/26/2020 - 01/02/2021), peripheral artery disease, paroxysmal atrial fibrillation chronically anticoagulated on Eliquis, hypertension, hyperlipidemia, stage IV chronic kidney disease with baseline creatinine 1.7-1.9, acquired hypothyroidism, laryngeal cancer status post laryngectomy in 2013 and s/p chemo/radiation   Readmitted with AKI and hyperkalemia and discharged 01/16/21. Crt 2.57 at discharge.  Patient comes in with her daughter in law who acts as her interpreter. Complains of eye swelling and watering eyes but wears false eyelashes. Denies chest pain, dyspnea, edema. Last Tues she had palpitations and took another metoprolol. Saw renal last week and had labs done but don't know values.BP high today. Watches salt and potassium closely. Wants to go to [place] in April.  Past Medical History:  Diagnosis Date  . Anemia   . Anxiety    takes Ativan prn  . Blood transfusion without reported diagnosis 09/15/12   2 units Prbc's  . Broken ribs   . Chronic back pain   . CKD (chronic kidney disease), stage IV ([place])   . Constipation    related to pain meds  . COPD (chronic obstructive pulmonary disease) ([place]) 08/10/2012   denies  . Depression   . Gastrostomy in place [facility])    removed  . GERD (gastroesophageal reflux disease)    takes Zantac daily  . Headache(784.0)   . Hiatal hernia 08/10/2012  . History of radiation  therapy 10/17/12-11/25/12   supraglottic larynx,high risk neck tumor bed 5880 cGy/28 sessions, high risk lymph node tumor bed 5600 cGy/20 sessions, mod risk lymph node tumor bed 5040 cGy/20 sessions  . Hypercholesteremia    takes Pravastatin daily  . Hypertension    takes Tribenzor and Atenolol daily  . Insomnia    takes Amitriptyline daily  . Nausea    takes Zofran prn  . PAD (peripheral artery disease) ([place])    noninvasive imaging in 2016  . Pneumonia   . 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 Shortness of breath dyspnea   . Stroke [facility]) 2011   denies. no residual  . Uterine cancer [facility])     Past Surgical History:  Procedure Laterality Date  . ABDOMINAL SURGERY     r/t uterine carcinoma  . APPENDECTOMY    . DIRECT LARYNGOSCOPY N/A 05/22/2014   Procedure: DIRECT LARYNGOSCOPY WITH ESOPHAGEAL DILATION;  Surgeon: Jerrell Belfast, MD;  Location: [place];  Service: ENT;  Laterality: N/A;  . ESOPHAGOSCOPY WITH DILITATION N/A 05/29/2015   Procedure: ESOPHAGOSCOPY WITH ESOPHAGEAL DILITATION;  Surgeon: Jerrell Belfast, MD;  Location: [facility] OR;  Service: ENT;  Laterality: N/A;  . Gastrostomy Tube removed   2013  . GASTROSTOMY W/ FEEDING TUBE  13  . HERNIA REPAIR     child  . LARYNGETOMY  08/31/2012   Procedure: LARYNGECTOMY;  Surgeon: Jerrell Belfast, MD;  Location: [place];  Service: ENT;  Laterality: N/A;  . LARYNGOSCOPY  08/10/2012   Procedure: LARYNGOSCOPY;  Surgeon:  Jerrell Belfast, MD;  Location: [facility];  Service: ENT;  Laterality: N/A;  with biopsy  . RADICAL NECK DISSECTION  08/31/2012   Procedure: RADICAL NECK DISSECTION;  Surgeon: Jerrell Belfast, MD;  Location: [place];  Service: ENT;  Laterality: Bilateral;  . TRACHEAL ESOPHOGEAL PUNCTURE WITH REPAIR STOMA N/A 09/08/2013   Procedure: TRACHEAL ESOPHOGEAL PUNCTURE WITH PLACEMENT OF  PROVOX PROSTHESIS ;  Surgeon: Jerrell Belfast, MD;  Location: [place];  Service: ENT;  Laterality: N/A;  . TRACHEOSTOMY TUBE PLACEMENT   08/10/2012   Procedure: TRACHEOSTOMY;  Surgeon: Jerrell Belfast, MD;  Location: [facility];  Service: ENT;  Laterality: N/A;    Current Medications: Current Meds  Medication Sig  . apixaban (ELIQUIS) 5 MG TABS tablet Take 1 tablet (5 mg total) by mouth 2 (two) times daily.  . cilostazol (PLETAL) 100 MG tablet Take 1 tablet (100 mg total) by mouth 2 (two) times daily.  . diphenoxylate-atropine (LOMOTIL) 2.5-0.025 MG tablet Take 1 tablet by mouth 4 (four) times daily as needed for diarrhea or loose stools.  . Ensure (ENSURE) Take 237 mLs by mouth 2 (two) times daily.  [name] escitalopram (LEXAPRO) 20 MG tablet Take 1 tablet (20 mg total) by mouth daily.  . famotidine (PEPCID) 40 MG tablet TAKE 1 TABLET BY MOUTH EVERY DAY  . fluticasone (FLONASE) 50 MCG/ACT nasal spray SPRAY 2 SPRAYS INTO EACH NOSTRIL EVERY DAY  . hydrALAZINE (APRESOLINE) 50 MG tablet Take 1.5 tablets (75 mg total) by mouth 3 (three) times daily.  [name] HYDROcodone-acetaminophen (HYCET) 7.5-325 mg/15 ml solution Take 15 mLs by mouth 4 (four) times daily as needed for up to 5 days for moderate pain.  . isosorbide mononitrate (IMDUR) 60 MG 24 hr tablet Take 1 tablet (60 mg total) by mouth daily.  [name] lactose free nutrition (BOOST) LIQD Take 237 mLs by mouth 2 (two) times daily between meals.  [name] levothyroxine (SYNTHROID) 50 MCG tablet Take 1 tablet (50 mcg total) by mouth daily before breakfast.  . loperamide (IMODIUM A-D) 2 MG tablet Take 2 mg by mouth 4 (four) times daily as needed for diarrhea or loose stools.  [name] LORazepam (ATIVAN) 1 MG tablet TAKE 1 TABLET BY MOUTH EVERY DAY  . metoprolol tartrate (LOPRESSOR) 50 MG tablet Take 1 tablet (50 mg total) by mouth 2 (two) times daily.  . ondansetron (ZOFRAN ODT) 8 MG disintegrating tablet Take 1 tablet (8 mg total) by mouth every 8 (eight) hours as needed for nausea or vomiting.  . rosuvastatin (CRESTOR) 20 MG tablet Take 1 tablet (20 mg total) by mouth daily.  [name] thiamine (VITAMIN B-1) 100 MG tablet  Take 1 tablet (100 mg total) by mouth daily.  . [DISCONTINUED] folic acid (FOLVITE) 1 MG tablet Take 1 tablet (1 mg total) by mouth daily.  . [DISCONTINUED] isosorbide mononitrate (IMDUR) 30 MG 24 hr tablet Take 1 tablet (30 mg total) by mouth daily.     Allergies:   Xyzal [levocetirizine dihydrochloride], Augmentin [amoxicillin-pot clavulanate], and Tribenzor [olmesartan-amlodipine-hctz]   Social History   Socioeconomic History  . Marital status: Widowed    Spouse name: Not on file  . Number of children: 4  . Years of education: Not on file  . Highest education level: Not on file  Occupational History    Comment: retired Regulatory affairs officer  Tobacco Use  . Smoking status: Former Smoker    Packs/day: 0.25    Years: 50.00    Pack years: 12.50    Types: Cigarettes  Quit date: 05/27/2013    Years since quitting: 7.6  . Smokeless tobacco: Never Used  Vaping Use  . Vaping Use: Never used  Substance and Sexual Activity  . Alcohol use: Yes    Comment: 1-2 shots per week  . Drug use: No  . Sexual activity: Not Currently  Other Topics Concern  . Not on file  Social History Narrative  . Not on file   Social Determinants of Health   Financial Resource Strain: Not on file  Food Insecurity: No Food Insecurity  . Worried About Charity fundraiser in the Last Year: Never true  . Ran Out of Food in the Last Year: Never true  Transportation Needs: No Transportation Needs  . Lack of Transportation (Medical): No  . Lack of Transportation (Non-Medical): No  Physical Activity: Not on file  Stress: Not on file  Social Connections: Socially Isolated  . Frequency of Communication with Friends and Family: Once a week  . Frequency of Social Gatherings with Friends and Family: Once a week  . Attends Religious Services: More than 4 times per year  . Active Member of Clubs or Organizations: No  . Attends Archivist Meetings: Never  . Marital Status: Widowed     Family History:   The patient's family history includes Brain cancer in her brother; Throat cancer in her father.   ROS:   Please see the history of present illness.    ROS All other systems reviewed and are negative.   PHYSICAL EXAM:   VS:  BP (!) 210/84   Pulse 64   Ht 5\' 4"  (1.626 m)   Wt 124 lb 12.8 oz (56.6 kg)   SpO2 98%   BMI 21.42 kg/m   Physical Exam  GEN: Thin, in no acute distress  Neck: trach no JVD, carotid bruits, or masses Cardiac:RRR; no murmurs, rubs, or gallops  Respiratory:  clear to auscultation bilaterally, normal work of breathing GI: soft, nontender, nondistended, + BS Ext: without cyanosis, clubbing, or edema, Good distal pulses bilaterally Neuro:  Alert and Oriented x 3, Psych: euthymic mood, full affect  Wt Readings from Last 3 Encounters:  01/28/21 124 lb 12.8 oz (56.6 kg)  01/23/21 125 lb (56.7 kg)  01/16/21 122 lb 9.6 oz (55.6 kg)      Studies/Labs Reviewed:   EKG:  EKG is not ordered today.  Recent Labs: 01/14/2021: ALT 19; TSH 3.879 01/16/2021: BUN 37; Creatinine, Ser 2.57; Hemoglobin 7.8; Magnesium 1.7; Platelets 215; Potassium 3.4; Sodium 140   Lipid Panel    Component Value Date/Time   CHOL 177 12/27/2020 0912   TRIG 346 (H) 12/27/2020 0912   HDL 35 (L) 12/27/2020 0912   CHOLHDL 5.1 12/27/2020 0912   VLDL 69 (H) 12/27/2020 0912   LDLCALC 73 12/27/2020 0912   LDLDIRECT 88.0 02/22/2020 1141    Additional studies/ records that were reviewed today include:  Transthoracic Echocardiogram: Date: 12/27/20 Results: FINDINGS   Left Ventricle: Left ventricular ejection fraction, by estimation, is 55  to 60%. The left ventricle has normal function. The left ventricle has no  regional wall motion abnormalities. The left ventricular internal cavity  size was small. There is no left  ventricular hypertrophy. Left ventricular diastolic parameters are  indeterminate.  Right Ventricle: The right ventricular size is normal. No increase in  right ventricular wall  thickness. Right ventricular systolic function is  normal.  Left Atrium: Left atrial size was normal in size.  Right Atrium: Right atrial size  was normal in size.  Pericardium: There is no evidence of pericardial effusion.  Mitral Valve: The mitral valve is grossly normal. Mild mitral annular  calcification. No evidence of mitral valve regurgitation.  Tricuspid Valve: The tricuspid valve is grossly normal. Tricuspid valve  regurgitation is not demonstrated.  Aortic Valve: The aortic valve is tricuspid. There is mild aortic valve  annular calcification. Aortic valve regurgitation is not visualized. No  aortic stenosis is present.  Pulmonic Valve: The pulmonic valve was grossly normal. Pulmonic valve  regurgitation is trivial.  Aorta: The aortic root and ascending aorta are structurally normal, with  no evidence of dilitation.  IAS/Shunts: The atrial septum is grossly normal.    NM Stress Testing : Date: 12/30/20 Results: Cancelled      Risk Assessment/Calculations:    CHA2DS2-VASc Score = 5  This indicates a 7.2% annual risk of stroke. The patient's score is based upon: CHF History: No HTN History: Yes Diabetes History: No Stroke History: No Vascular Disease History: Yes Age Score: 2 Gender Score: 1        ASSESSMENT:    1. Coronary artery disease involving native coronary artery of native heart without angina pectoris   2. Paroxysmal atrial fibrillation (HCC)   3. Essential hypertension   4. Hyperlipidemia, unspecified hyperlipidemia type   5. Deep vein thrombosis (DVT) of lower extremity, unspecified chronicity, unspecified laterality, unspecified vein (HCC)   6. AKI (acute kidney injury) ([place])      PLAN:  In order of problems listed above:  CAD NSTEMI 12/26/20 treated medically-no chest pain. On metoprolol. No ASA b/c of eliquis  PAF on Eliquis  DVT-12/20/20 on Eliquis 5 mg bid. Based on weight and Crt >5 if Eliquis was only for Afib she should be on 2.5 mg  bid but being followed by oncology and recent DVT-will let them manage appropriate dose. thanks  Brittle HTN BP running high. Watches sodium Increase Imdur 60 mg daily  HLD on Crestor  AKI with hyperkalemia followed by nephrology-labs done last week   Shared Decision Making/Informed Consent        Medication Adjustments/Labs and Tests Ordered: Current medicines are reviewed at length with the patient today.  Concerns regarding medicines are outlined above.  Medication changes, Labs and Tests ordered today are listed in the Patient Instructions below. Patient Instructions   Medication Instructions:  1. Increase the imdur (isosorbide mononitrate) to 60 mg by mouth daily *If you need a refill on your cardiac medications before your next appointment, please call your pharmacy*   Lab Work: None If you have labs (blood work) drawn today and your tests are completely normal, you will receive your results only by: [name] MyChart Message (if you have MyChart) OR . A paper copy in the mail If you have any lab test that is abnormal or we need to change your treatment, we will call you to review the results.   Testing/Procedures: None   Follow-Up: At [facility], you and your health needs are our priority.  As part of our continuing mission to provide you with exceptional heart care, we have created designated Provider Care Teams.  These Care Teams include your primary Cardiologist (physician) and Advanced Practice Providers (APPs -  Physician Assistants and Nurse Practitioners) who all work together to provide you with the care you need, when you need it.  We recommend signing up for the patient portal called "MyChart".  Sign up information is provided on this After Visit Summary.  MyChart is  used to connect with patients for Virtual Visits (Telemedicine).  Patients are able to view lab/test results, encounter notes, upcoming appointments, etc.  Non-urgent messages can be sent to your  provider as well.   To learn more about what you can do with MyChart, go to [email or web address].    Your next appointment:   March 28, 2021  The format for your next appointment:   In Person  Provider:   Mertie Moores, MD   Other Instructions Two Gram Sodium Diet 2000 mg  What is Sodium? Sodium is a mineral found naturally in many foods. The most significant source of sodium in the diet is table salt, which is about 40% sodium.  Processed, convenience, and preserved foods also contain a large amount of sodium.  The body needs only 500 mg of sodium daily to function,  A normal diet provides more than enough sodium even if you do not use salt.  Why Limit Sodium? A build up of sodium in the body can cause thirst, increased blood pressure, shortness of breath, and water retention.  Decreasing sodium in the diet can reduce edema and risk of heart attack or stroke associated with high blood pressure.  Keep in mind that there are many other factors involved in these health problems.  Heredity, obesity, lack of exercise, cigarette smoking, stress and what you eat all play a role.  General Guidelines:  Do not add salt at the table or in cooking.  One teaspoon of salt contains over 2 grams of sodium.  Read food labels  Avoid processed and convenience foods  Ask your dietitian before eating any foods not dicussed in the menu planning guidelines  Consult your physician if you wish to use a salt substitute or a sodium containing medication such as antacids.  Limit milk and milk products to 16 oz (2 cups) per day.  Shopping Hints:  READ LABELS!! "Dietetic" does not necessarily mean low sodium.  Salt and other sodium ingredients are often added to foods during processing.   Menu Planning Guidelines Food Group Choose More Often Avoid  Beverages (see also the milk group All fruit juices, low-sodium, salt-free vegetables juices, low-sodium carbonated beverages Regular vegetable or tomato  juices, commercially softened water used for drinking or cooking  Breads and Cereals Enriched white, wheat, rye and pumpernickel bread, hard rolls and dinner rolls; muffins, cornbread and waffles; most dry cereals, cooked cereal without added salt; unsalted crackers and breadsticks; low sodium or homemade bread crumbs Bread, rolls and crackers with salted tops; quick breads; instant hot cereals; pancakes; commercial bread stuffing; self-rising flower and biscuit mixes; regular bread crumbs or cracker crumbs  Desserts and Sweets Desserts and sweets mad with mild should be within allowance Instant pudding mixes and cake mixes  Fats Butter or margarine; vegetable oils; unsalted salad dressings, regular salad dressings limited to 1 Tbs; light, sour and heavy cream Regular salad dressings containing bacon fat, bacon bits, and salt pork; snack dips made with instant soup mixes or processed cheese; salted nuts  Fruits Most fresh, frozen and canned fruits Fruits processed with salt or sodium-containing ingredient (some dried fruits are processed with sodium sulfites        Vegetables Fresh, frozen vegetables and low- sodium canned vegetables Regular canned vegetables, sauerkraut, pickled vegetables, and others prepared in brine; frozen vegetables in sauces; vegetables seasoned with ham, bacon or salt pork  Condiments, Sauces, Miscellaneous  Salt substitute with physician's approval; pepper, herbs, spices; vinegar, lemon or lime juice; hot pepper sauce;  garlic powder, onion powder, low sodium soy sauce (1 Tbs.); low sodium condiments (ketchup, chili sauce, mustard) in limited amounts (1 tsp.) fresh ground horseradish; unsalted tortilla chips, pretzels, potato chips, popcorn, salsa (1/4 cup) Any seasoning made with salt including garlic salt, celery salt, onion salt, and seasoned salt; sea salt, rock salt, kosher salt; meat tenderizers; monosodium glutamate; mustard, regular soy sauce, barbecue, sauce, chili  sauce, teriyaki sauce, steak sauce, Worcestershire sauce, and most flavored vinegars; canned gravy and mixes; regular condiments; salted snack foods, olives, picles, relish, horseradish sauce, catsup   Food preparation: Try these seasonings Meats:    Pork Sage, onion Serve with applesauce  Chicken Poultry seasoning, thyme, parsley Serve with cranberry sauce  Lamb Curry powder, rosemary, garlic, thyme Serve with mint sauce or jelly  Veal Marjoram, basil Serve with current jelly, cranberry sauce  Beef Pepper, bay leaf Serve with dry mustard, unsalted chive butter  Fish [place] leaf, dill Serve with unsalted lemon butter, unsalted parsley butter  Vegetables:    Asparagus Lemon juice   Broccoli Lemon juice   Carrots Mustard dressing parsley, mint, nutmeg, glazed with unsalted butter and sugar   Green beans Marjoram, lemon juice, nutmeg,dill seed   Tomatoes Basil, marjoram, onion   Spice /blend for [employer]" 4 tsp ground thyme 1 tsp ground sage 3 tsp ground rosemary 4 tsp ground marjoram   Test your knowledge 1. A product that says "Salt Free" may still contain sodium. True or False 2. Garlic Powder and Hot Pepper Sauce an be used as alternative seasonings.True or False 3. Processed foods have more sodium than fresh foods.  True or False 4. Canned Vegetables have less sodium than froze True or False  WAYS TO DECREASE YOUR SODIUM INTAKE 1. Avoid the use of added salt in cooking and at the table.  Table salt (and other prepared seasonings which contain salt) is probably one of the greatest sources of sodium in the diet.  Unsalted foods can gain flavor from the sweet, sour, and butter taste sensations of herbs and spices.  Instead of using salt for seasoning, try the following seasonings with the foods listed.  Remember: how you use them to enhance natural food flavors is limited only by your creativity... Allspice-Meat, fish, eggs, fruit, peas, red and yellow vegetables Almond Extract-Fruit  baked goods Anise Seed-Sweet breads, fruit, carrots, beets, cottage cheese, cookies (tastes like licorice) Basil-Meat, fish, eggs, vegetables, rice, vegetables salads, soups, sauces Bay Leaf-Meat, fish, stews, poultry Burnet-Salad, vegetables (cucumber-like flavor) Caraway Seed-Bread, cookies, cottage cheese, meat, vegetables, cheese, rice Cardamon-Baked goods, fruit, soups Celery Powder or seed-Salads, salad dressings, sauces, meatloaf, soup, bread.Do not use  celery salt Chervil-Meats, salads, fish, eggs, vegetables, cottage cheese (parsley-like flavor) Chili Power-Meatloaf, chicken cheese, corn, eggplant, egg dishes Chives-Salads cottage cheese, egg dishes, soups, vegetables, sauces Cilantro-Salsa, casseroles Cinnamon-Baked goods, fruit, pork, lamb, chicken, carrots Cloves-Fruit, baked goods, fish, pot roast, green beans, beets, carrots Coriander-Pastry, cookies, meat, salads, cheese (lemon-orange flavor) Cumin-Meatloaf, fish,cheese, eggs, cabbage,fruit pie (caraway flavor) [employer], fruit, eggs, fish, poultry, cottage cheese, vegetables Dill Seed-Meat, cottage cheese, poultry, vegetables, fish, salads, bread Fennel Seed-Bread, cookies, apples, pork, eggs, fish, beets, cabbage, cheese, Licorice-like flavor Garlic-(buds or powder) Salads, meat, poultry, fish, bread, butter, vegetables, potatoes.Do not  use garlic salt Ginger-Fruit, vegetables, baked goods, meat, fish, poultry Horseradish Root-Meet, vegetables, butter Lemon Juice or Extract-Vegetables, fruit, tea, baked goods, fish salads Mace-Baked goods fruit, vegetables, fish, poultry (taste like nutmeg) Maple Extract-Syrups Marjoram-Meat, chicken, fish, vegetables, breads, green salads (taste  like Sage) Mint-Tea, lamb, sherbet, vegetables, desserts, carrots, cabbage Mustard, Dry or Seed-Cheese, eggs, meats, vegetables, poultry Nutmeg-Baked goods, fruit, chicken, eggs, vegetables, desserts Onion Powder-Meat, fish, poultry,  vegetables, cheese, eggs, bread, rice salads (Do not use   Onion salt) Orange Extract-Desserts, baked goods Oregano-Pasta, eggs, cheese, onions, pork, lamb, fish, chicken, vegetables, green salads Paprika-Meat, fish, poultry, eggs, cheese, vegetables Parsley Flakes-Butter, vegetables, meat fish, poultry, eggs, bread, salads (certain forms may   Contain sodium Pepper-Meat fish, poultry, vegetables, eggs Peppermint Extract-Desserts, baked goods Poppy Seed-Eggs, bread, cheese, fruit dressings, baked goods, noodles, vegetables, cottage  [employer], poultry, meat, fish, cauliflower, turnips,eggs bread Saffron-Rice, bread, veal, chicken, fish, eggs Sage-Meat, fish, poultry, onions, eggplant, tomateos, pork, stews Savory-Eggs, salads, poultry, meat, rice, vegetables, soups, pork Tarragon-Meat, poultry, fish, eggs, butter, vegetables (licorice-like flavor)  Thyme-Meat, poultry, fish, eggs, vegetables, (clover-like flavor), sauces, soups Tumeric-Salads, butter, eggs, fish, rice, vegetables (saffron-like flavor) Vanilla Extract-Baked goods, candy Vinegar-Salads, vegetables, meat marinades Walnut Extract-baked goods, candy  2. Choose your Foods Wisely   The following is a list of foods to avoid which are high in sodium:  Meats-Avoid all smoked, canned, salt cured, dried and kosher meat and fish as well as Anchovies   [employer] meats:Bologna, Liverwurst, Pastrami Canned meat or fish  Marinated herring Caviar    Pepperoni Corned Beef   Pizza Dried chipped beef  Salami Frozen breaded fish or meat Salt pork Frankfurters or hot dogs  Sardines Gefilte fish   Sausage Ham (boiled ham, Proscuitto Smoked butt    spiced ham)   Spam      TV Dinners Vegetables Canned vegetables (Regular) Relish Canned mushrooms  Sauerkraut Olives    Tomato juice Pickles  Bakery and Dessert Products Canned puddings  Cream pies Cheesecake   Decorated  cakes Cookies  Beverages/Juices Tomato juice, regular  Gatorade   V-8 vegetable juice, regular  Breads and Cereals Biscuit mixes   Salted potato chips, corn chips, pretzels Bread stuffing mixes  Salted crackers and rolls Pancake and waffle mixes Self-rising flour  Seasonings Accent    Meat sauces Barbecue sauce  Meat tenderizer Catsup    Monosodium glutamate (MSG) Celery salt   Onion salt Chili sauce   Prepared mustard Garlic salt   Salt, seasoned salt, sea salt Gravy mixes   Soy sauce Horseradish   Steak sauce Ketchup   Tartar sauce Lite salt    Teriyaki sauce Marinade mixes   Worcestershire sauce  Others Baking powder   Cocoa and cocoa mixes Baking soda   Commercial casserole mixes Candy-caramels, chocolate  Dehydrated soups    Bars, fudge,nougats  Instant rice and pasta mixes Canned broth or soup  Maraschino cherries Cheese, aged and processed cheese and cheese spreads  Learning Assessment Quiz  Indicated T (for True) or F (for False) for each of the following statements:  1. _____ Fresh fruits and vegetables and unprocessed grains are generally low in sodium 2. _____ Water may contain a considerable amount of sodium, depending on the source 3. _____ You can always tell if a food is high in sodium by tasting it 4. _____ Certain laxatives my be high in sodium and should be avoided unless prescribed   by a physician or pharmacist 5. _____ Salt substitutes may be used freely by anyone on a sodium restricted diet 6. _____ Sodium is present in table salt, food additives and as a natural component of   most foods 7. _____ Table salt is approximately  90% sodium 8. _____ Limiting sodium intake may help prevent excess fluid accumulation in the body 9. _____ On a sodium-restricted diet, seasonings such as bouillon soy sauce, and    cooking wine should be used in place of table salt 10. _____ On an ingredient list, a product which lists monosodium glutamate as the first    ingredient is an appropriate food to include on a low sodium diet  Circle the best answer(s) to the following statements (Hint: there may be more than one correct answer)  11. On a low-sodium diet, some acceptable snack items are:    A. Olives  F. Bean dip   K. Grapefruit juice    B. Salted Pretzels G. Commercial Popcorn   L. Canned peaches    C. Carrot Sticks  H. Bouillon   M. Unsalted nuts   D. [place] fries  I. Peanut butter crackers N. Salami   E. Sweet pickles J. Tomato Juice   O. Pizza  12.  Seasonings that may be used freely on a reduced - sodium diet include   A. Lemon wedges F.Monosodium glutamate K. Celery seed    B.Soysauce   G. Pepper   L. Mustard powder   C. Sea salt  H. Cooking wine  M. Onion flakes   D. Vinegar  E. Prepared horseradish N. Salsa   E. Sage   J. Worcestershire sauce  O. [address]      Sumner Boast, PA-C  01/28/2021 4:37 PM    [facility] [address], [place], [place]  [id number] Phone: [phone]; Fax: [phone]

## 2021-01-23 ENCOUNTER — Ambulatory Visit (INDEPENDENT_AMBULATORY_CARE_PROVIDER_SITE_OTHER): Payer: Medicare Other | Admitting: Internal Medicine

## 2021-01-23 ENCOUNTER — Encounter: Payer: Self-pay | Admitting: Internal Medicine

## 2021-01-23 ENCOUNTER — Telehealth: Payer: Self-pay | Admitting: Internal Medicine

## 2021-01-23 ENCOUNTER — Other Ambulatory Visit: Payer: Self-pay

## 2021-01-23 DIAGNOSIS — M5441 Lumbago with sciatica, right side: Secondary | ICD-10-CM

## 2021-01-23 DIAGNOSIS — I249 Acute ischemic heart disease, unspecified: Secondary | ICD-10-CM

## 2021-01-23 DIAGNOSIS — N184 Chronic kidney disease, stage 4 (severe): Secondary | ICD-10-CM | POA: Diagnosis not present

## 2021-01-23 DIAGNOSIS — I129 Hypertensive chronic kidney disease with stage 1 through stage 4 chronic kidney disease, or unspecified chronic kidney disease: Secondary | ICD-10-CM

## 2021-01-23 DIAGNOSIS — M545 Low back pain, unspecified: Secondary | ICD-10-CM | POA: Insufficient documentation

## 2021-01-23 DIAGNOSIS — M5442 Lumbago with sciatica, left side: Secondary | ICD-10-CM

## 2021-01-23 MED ORDER — METHYLPREDNISOLONE ACETATE 40 MG/ML IJ SUSP
40.0000 mg | Freq: Once | INTRAMUSCULAR | Status: AC
Start: 2021-01-23 — End: 2021-01-23
  Administered 2021-01-23: 40 mg via INTRAMUSCULAR

## 2021-01-23 MED ORDER — HYDROCODONE-ACETAMINOPHEN 7.5-325 MG/15ML PO SOLN: 15.0000 mL | Freq: Four times a day (QID) | ORAL | 0 refills | Status: AC | PRN

## 2021-01-23 NOTE — Progress Notes (Signed)
   Subjective:   Patient ID: [name], female    DOB: [birth year], 75 y.o.   MRN: [id number]  HPI The patient is a 75 YO female coming in for hospital follow up (in for high potassium and AKI, treated for potassium and mild fluids with improvement, severely low bicarb and on drip for that). She is overall stable. Still having some palpitations and has taken an extra metoprolol for this. Denies SOB or chest pains. Denies nausea or vomiting. Labs yesterday at nephrologist and waiting to hear back about those.   PMH, [facility], social history reviewed and updated  Review of Systems  Constitutional: Positive for fatigue.  HENT: Negative.   Eyes: Negative.   Respiratory: Negative for cough, chest tightness and shortness of breath.   Cardiovascular: Positive for palpitations. Negative for chest pain and leg swelling.  Gastrointestinal: Negative for abdominal distention, abdominal pain, constipation, diarrhea, nausea and vomiting.  Musculoskeletal: Negative.   Skin: Negative.   Neurological: Negative.   Psychiatric/Behavioral: Negative.     Objective:  Physical Exam Constitutional:      Appearance: She is well-developed and well-nourished.  HENT:     Head: Normocephalic and atraumatic.  Eyes:     Extraocular Movements: EOM normal.  Cardiovascular:     Rate and Rhythm: Normal rate and regular rhythm.  Pulmonary:     Effort: Pulmonary effort is normal. No respiratory distress.     Breath sounds: Normal breath sounds. No wheezing or rales.     Comments: Larynx surgery with trach site stable, chronic, voice is hoarse due to this Abdominal:     General: Bowel sounds are normal. There is no distension.     Palpations: Abdomen is soft.     Tenderness: There is no abdominal tenderness. There is no rebound.  Musculoskeletal:        General: No edema.     Cervical back: Normal range of motion.  Skin:    General: Skin is warm and dry.  Neurological:     Mental Status: She is alert and  oriented to person, place, and time.     Coordination: Coordination normal.  Psychiatric:        Mood and Affect: Mood and affect normal.     Vitals:   01/23/21 1109  BP: 130/66  Pulse: 72  Resp: 18  Temp: 98.4 F (36.9 C)  TempSrc: Oral  SpO2: 93%  Weight: 125 lb (56.7 kg)  Height: 5\' 4"  (1.626 m)    This visit occurred during the SARS-CoV-2 public health emergency.  Safety protocols were in place, including screening questions prior to the visit, additional usage of staff PPE, and extensive cleaning of exam room while observing appropriate contact time as indicated for disinfecting solutions.   Visit time 25 minutes in face to face communication with patient and coordination of care, additional 10 minutes spent in record review, coordination or care, ordering tests, communicating/referring to other healthcare professionals, documenting in medical records all on the same day of the visit for total time 35 minutes spent on the visit.    Assessment & Plan:  Depo-medrol 40 mg IM

## 2021-01-23 NOTE — Patient Instructions (Signed)
We have given you the shot for the pain today and refilled the liquid pain medicine.

## 2021-01-23 NOTE — Assessment & Plan Note (Addendum)
Given depo-medrol 40 mg IM. Reviewed recent x-ray with them at visit. Rx hydrocodone for rare usage.

## 2021-01-23 NOTE — Telephone Encounter (Signed)
Patient is wanting to be a TOC to [facility]. Please advise

## 2021-01-23 NOTE — Telephone Encounter (Signed)
Yes, that is ok with me 

## 2021-01-24 NOTE — Assessment & Plan Note (Signed)
Recent labs with nephrology and they will adjust if needed. Recently two hospital stays with AKI.

## 2021-01-24 NOTE — Telephone Encounter (Signed)
Patient scheduled for TOC 4.25.22

## 2021-01-27 ENCOUNTER — Encounter: Payer: Self-pay | Admitting: Family

## 2021-01-28 ENCOUNTER — Ambulatory Visit (INDEPENDENT_AMBULATORY_CARE_PROVIDER_SITE_OTHER): Payer: Medicare Other | Admitting: Physician Assistant

## 2021-01-28 ENCOUNTER — Encounter: Payer: Self-pay | Admitting: Physician Assistant

## 2021-01-28 ENCOUNTER — Other Ambulatory Visit: Payer: Self-pay

## 2021-01-28 VITALS — BP 210/84 | HR 64 | Ht 64.0 in | Wt 124.8 lb

## 2021-01-28 DIAGNOSIS — I480 Paroxysmal atrial fibrillation: Secondary | ICD-10-CM

## 2021-01-28 DIAGNOSIS — I2510 Atherosclerotic heart disease of native coronary artery without angina pectoris: Secondary | ICD-10-CM | POA: Diagnosis not present

## 2021-01-28 DIAGNOSIS — I82409 Acute embolism and thrombosis of unspecified deep veins of unspecified lower extremity: Secondary | ICD-10-CM | POA: Diagnosis not present

## 2021-01-28 DIAGNOSIS — I249 Acute ischemic heart disease, unspecified: Secondary | ICD-10-CM | POA: Diagnosis not present

## 2021-01-28 DIAGNOSIS — I10 Essential (primary) hypertension: Secondary | ICD-10-CM

## 2021-01-28 DIAGNOSIS — E785 Hyperlipidemia, unspecified: Secondary | ICD-10-CM | POA: Diagnosis not present

## 2021-01-28 DIAGNOSIS — N179 Acute kidney failure, unspecified: Secondary | ICD-10-CM

## 2021-01-28 MED ORDER — ISOSORBIDE MONONITRATE ER 60 MG PO TB24
60.0000 mg | ORAL_TABLET | Freq: Every day | ORAL | 3 refills | Status: DC
Start: 2021-01-28 — End: 2022-01-08

## 2021-01-28 NOTE — Patient Instructions (Signed)
Medication Instructions:  1. Increase the imdur (isosorbide mononitrate) to 60 mg by mouth daily *If you need a refill on your cardiac medications before your next appointment, please call your pharmacy*   Lab Work: None If you have labs (blood work) drawn today and your tests are completely normal, you will receive your results only by: [name] MyChart Message (if you have MyChart) OR . A paper copy in the mail If you have any lab test that is abnormal or we need to change your treatment, we will call you to review the results.   Testing/Procedures: None   Follow-Up: At [facility], you and your health needs are our priority.  As part of our continuing mission to provide you with exceptional heart care, we have created designated Provider Care Teams.  These Care Teams include your primary Cardiologist (physician) and Advanced Practice Providers (APPs -  Physician Assistants and Nurse Practitioners) who all work together to provide you with the care you need, when you need it.  We recommend signing up for the patient portal called "MyChart".  Sign up information is provided on this After Visit Summary.  MyChart is used to connect with patients for Virtual Visits (Telemedicine).  Patients are able to view lab/test results, encounter notes, upcoming appointments, etc.  Non-urgent messages can be sent to your provider as well.   To learn more about what you can do with MyChart, go to [email or web address].    Your next appointment:   March 28, 2021  The format for your next appointment:   In Person  Provider:   Mertie Moores, MD   Other Instructions Two Gram Sodium Diet 2000 mg  What is Sodium? Sodium is a mineral found naturally in many foods. The most significant source of sodium in the diet is table salt, which is about 40% sodium.  Processed, convenience, and preserved foods also contain a large amount of sodium.  The body needs only 500 mg of sodium daily to function,  A normal  diet provides more than enough sodium even if you do not use salt.  Why Limit Sodium? A build up of sodium in the body can cause thirst, increased blood pressure, shortness of breath, and water retention.  Decreasing sodium in the diet can reduce edema and risk of heart attack or stroke associated with high blood pressure.  Keep in mind that there are many other factors involved in these health problems.  Heredity, obesity, lack of exercise, cigarette smoking, stress and what you eat all play a role.  General Guidelines:  Do not add salt at the table or in cooking.  One teaspoon of salt contains over 2 grams of sodium.  Read food labels  Avoid processed and convenience foods  Ask your dietitian before eating any foods not dicussed in the menu planning guidelines  Consult your physician if you wish to use a salt substitute or a sodium containing medication such as antacids.  Limit milk and milk products to 16 oz (2 cups) per day.  Shopping Hints:  READ LABELS!! "Dietetic" does not necessarily mean low sodium.  Salt and other sodium ingredients are often added to foods during processing.   Menu Planning Guidelines Food Group Choose More Often Avoid  Beverages (see also the milk group All fruit juices, low-sodium, salt-free vegetables juices, low-sodium carbonated beverages Regular vegetable or tomato juices, commercially softened water used for drinking or cooking  Breads and Cereals Enriched white, wheat, rye and pumpernickel bread, hard rolls and dinner rolls;  muffins, cornbread and waffles; most dry cereals, cooked cereal without added salt; unsalted crackers and breadsticks; low sodium or homemade bread crumbs Bread, rolls and crackers with salted tops; quick breads; instant hot cereals; pancakes; commercial bread stuffing; self-rising flower and biscuit mixes; regular bread crumbs or cracker crumbs  Desserts and Sweets Desserts and sweets mad with mild should be within allowance Instant  pudding mixes and cake mixes  Fats Butter or margarine; vegetable oils; unsalted salad dressings, regular salad dressings limited to 1 Tbs; light, sour and heavy cream Regular salad dressings containing bacon fat, bacon bits, and salt pork; snack dips made with instant soup mixes or processed cheese; salted nuts  Fruits Most fresh, frozen and canned fruits Fruits processed with salt or sodium-containing ingredient (some dried fruits are processed with sodium sulfites        Vegetables Fresh, frozen vegetables and low- sodium canned vegetables Regular canned vegetables, sauerkraut, pickled vegetables, and others prepared in brine; frozen vegetables in sauces; vegetables seasoned with ham, bacon or salt pork  Condiments, Sauces, Miscellaneous  Salt substitute with physician's approval; pepper, herbs, spices; vinegar, lemon or lime juice; hot pepper sauce; garlic powder, onion powder, low sodium soy sauce (1 Tbs.); low sodium condiments (ketchup, chili sauce, mustard) in limited amounts (1 tsp.) fresh ground horseradish; unsalted tortilla chips, pretzels, potato chips, popcorn, salsa (1/4 cup) Any seasoning made with salt including garlic salt, celery salt, onion salt, and seasoned salt; sea salt, rock salt, kosher salt; meat tenderizers; monosodium glutamate; mustard, regular soy sauce, barbecue, sauce, chili sauce, teriyaki sauce, steak sauce, Worcestershire sauce, and most flavored vinegars; canned gravy and mixes; regular condiments; salted snack foods, olives, picles, relish, horseradish sauce, catsup   Food preparation: Try these seasonings Meats:    Pork Sage, onion Serve with applesauce  Chicken Poultry seasoning, thyme, parsley Serve with cranberry sauce  Lamb Curry powder, rosemary, garlic, thyme Serve with mint sauce or jelly  Veal Marjoram, basil Serve with current jelly, cranberry sauce  Beef Pepper, bay leaf Serve with dry mustard, unsalted chive butter  Fish [place] leaf, dill Serve with  unsalted lemon butter, unsalted parsley butter  Vegetables:    Asparagus Lemon juice   Broccoli Lemon juice   Carrots Mustard dressing parsley, mint, nutmeg, glazed with unsalted butter and sugar   Green beans Marjoram, lemon juice, nutmeg,dill seed   Tomatoes Basil, marjoram, onion   Spice /blend for [employer]" 4 tsp ground thyme 1 tsp ground sage 3 tsp ground rosemary 4 tsp ground marjoram   Test your knowledge 1. A product that says "Salt Free" may still contain sodium. True or False 2. Garlic Powder and Hot Pepper Sauce an be used as alternative seasonings.True or False 3. Processed foods have more sodium than fresh foods.  True or False 4. Canned Vegetables have less sodium than froze True or False  WAYS TO DECREASE YOUR SODIUM INTAKE 1. Avoid the use of added salt in cooking and at the table.  Table salt (and other prepared seasonings which contain salt) is probably one of the greatest sources of sodium in the diet.  Unsalted foods can gain flavor from the sweet, sour, and butter taste sensations of herbs and spices.  Instead of using salt for seasoning, try the following seasonings with the foods listed.  Remember: how you use them to enhance natural food flavors is limited only by your creativity... Allspice-Meat, fish, eggs, fruit, peas, red and yellow vegetables Almond Extract-Fruit baked goods Anise Seed-Sweet breads, fruit, carrots,  beets, cottage cheese, cookies (tastes like licorice) Basil-Meat, fish, eggs, vegetables, rice, vegetables salads, soups, sauces Bay Leaf-Meat, fish, stews, poultry Burnet-Salad, vegetables (cucumber-like flavor) Caraway Seed-Bread, cookies, cottage cheese, meat, vegetables, cheese, rice Cardamon-Baked goods, fruit, soups Celery Powder or seed-Salads, salad dressings, sauces, meatloaf, soup, bread.Do not use  celery salt Chervil-Meats, salads, fish, eggs, vegetables, cottage cheese (parsley-like flavor) Chili Power-Meatloaf, chicken cheese,  corn, eggplant, egg dishes Chives-Salads cottage cheese, egg dishes, soups, vegetables, sauces Cilantro-Salsa, casseroles Cinnamon-Baked goods, fruit, pork, lamb, chicken, carrots Cloves-Fruit, baked goods, fish, pot roast, green beans, beets, carrots Coriander-Pastry, cookies, meat, salads, cheese (lemon-orange flavor) Cumin-Meatloaf, fish,cheese, eggs, cabbage,fruit pie (caraway flavor) [employer], fruit, eggs, fish, poultry, cottage cheese, vegetables Dill Seed-Meat, cottage cheese, poultry, vegetables, fish, salads, bread Fennel Seed-Bread, cookies, apples, pork, eggs, fish, beets, cabbage, cheese, Licorice-like flavor Garlic-(buds or powder) Salads, meat, poultry, fish, bread, butter, vegetables, potatoes.Do not  use garlic salt Ginger-Fruit, vegetables, baked goods, meat, fish, poultry Horseradish Root-Meet, vegetables, butter Lemon Juice or Extract-Vegetables, fruit, tea, baked goods, fish salads Mace-Baked goods fruit, vegetables, fish, poultry (taste like nutmeg) Maple Extract-Syrups Marjoram-Meat, chicken, fish, vegetables, breads, green salads (taste like Sage) Mint-Tea, lamb, sherbet, vegetables, desserts, carrots, cabbage Mustard, Dry or Seed-Cheese, eggs, meats, vegetables, poultry Nutmeg-Baked goods, fruit, chicken, eggs, vegetables, desserts Onion Powder-Meat, fish, poultry, vegetables, cheese, eggs, bread, rice salads (Do not use   Onion salt) Orange Extract-Desserts, baked goods Oregano-Pasta, eggs, cheese, onions, pork, lamb, fish, chicken, vegetables, green salads Paprika-Meat, fish, poultry, eggs, cheese, vegetables Parsley Flakes-Butter, vegetables, meat fish, poultry, eggs, bread, salads (certain forms may   Contain sodium Pepper-Meat fish, poultry, vegetables, eggs Peppermint Extract-Desserts, baked goods Poppy Seed-Eggs, bread, cheese, fruit dressings, baked goods, noodles, vegetables, cottage  [employer],  poultry, meat, fish, cauliflower, turnips,eggs bread Saffron-Rice, bread, veal, chicken, fish, eggs Sage-Meat, fish, poultry, onions, eggplant, tomateos, pork, stews Savory-Eggs, salads, poultry, meat, rice, vegetables, soups, pork Tarragon-Meat, poultry, fish, eggs, butter, vegetables (licorice-like flavor)  Thyme-Meat, poultry, fish, eggs, vegetables, (clover-like flavor), sauces, soups Tumeric-Salads, butter, eggs, fish, rice, vegetables (saffron-like flavor) Vanilla Extract-Baked goods, candy Vinegar-Salads, vegetables, meat marinades Walnut Extract-baked goods, candy  2. Choose your Foods Wisely   The following is a list of foods to avoid which are high in sodium:  Meats-Avoid all smoked, canned, salt cured, dried and kosher meat and fish as well as Anchovies   [employer] meats:Bologna, Liverwurst, Pastrami Canned meat or fish  Marinated herring Caviar    Pepperoni Corned Beef   Pizza Dried chipped beef  Salami Frozen breaded fish or meat Salt pork Frankfurters or hot dogs  Sardines Gefilte fish   Sausage Ham (boiled ham, Proscuitto Smoked butt    spiced ham)   Spam      TV Dinners Vegetables Canned vegetables (Regular) Relish Canned mushrooms  Sauerkraut Olives    Tomato juice Pickles  Bakery and Dessert Products Canned puddings  Cream pies Cheesecake   Decorated cakes Cookies  Beverages/Juices Tomato juice, regular  Gatorade   V-8 vegetable juice, regular  Breads and Cereals Biscuit mixes   Salted potato chips, corn chips, pretzels Bread stuffing mixes  Salted crackers and rolls Pancake and waffle mixes Self-rising flour  Seasonings Accent    Meat sauces Barbecue sauce  Meat tenderizer Catsup    Monosodium glutamate (MSG) Celery salt   Onion salt Chili sauce   Prepared mustard Garlic salt   Salt, seasoned salt, sea salt Gravy mixes   Soy sauce Horseradish  Steak sauce Ketchup   Tartar sauce Lite salt    Teriyaki sauce Marinade  mixes   Worcestershire sauce  Others Baking powder   Cocoa and cocoa mixes Baking soda   Commercial casserole mixes Candy-caramels, chocolate  Dehydrated soups    Bars, fudge,nougats  Instant rice and pasta mixes Canned broth or soup  Maraschino cherries Cheese, aged and processed cheese and cheese spreads  Learning Assessment Quiz  Indicated T (for True) or F (for False) for each of the following statements:  1. _____ Fresh fruits and vegetables and unprocessed grains are generally low in sodium 2. _____ Water may contain a considerable amount of sodium, depending on the source 3. _____ You can always tell if a food is high in sodium by tasting it 4. _____ Certain laxatives my be high in sodium and should be avoided unless prescribed   by a physician or pharmacist 5. _____ Salt substitutes may be used freely by anyone on a sodium restricted diet 6. _____ Sodium is present in table salt, food additives and as a natural component of   most foods 7. _____ Table salt is approximately 90% sodium 8. _____ Limiting sodium intake may help prevent excess fluid accumulation in the body 9. _____ On a sodium-restricted diet, seasonings such as bouillon soy sauce, and    cooking wine should be used in place of table salt 10. _____ On an ingredient list, a product which lists monosodium glutamate as the first   ingredient is an appropriate food to include on a low sodium diet  Circle the best answer(s) to the following statements (Hint: there may be more than one correct answer)  11. On a low-sodium diet, some acceptable snack items are:    A. Olives  F. Bean dip   K. Grapefruit juice    B. Salted Pretzels G. Commercial Popcorn   L. Canned peaches    C. Carrot Sticks  H. Bouillon   M. Unsalted nuts   D. [place] fries  I. Peanut butter crackers N. Salami   E. Sweet pickles J. Tomato Juice   O. Pizza  12.  Seasonings that may be used freely on a reduced - sodium diet include   A. Lemon  wedges F.Monosodium glutamate K. Celery seed    B.Soysauce   G. Pepper   L. Mustard powder   C. Sea salt  H. Cooking wine  M. Onion flakes   D. Vinegar  E. Prepared horseradish N. Salsa   E. Sage   J. Worcestershire sauce  O. Chutney

## 2021-01-30 ENCOUNTER — Inpatient Hospital Stay: Payer: Medicare Other | Admitting: Hematology and Oncology

## 2021-01-30 ENCOUNTER — Telehealth: Payer: Self-pay | Admitting: Internal Medicine

## 2021-01-30 DIAGNOSIS — F411 Generalized anxiety disorder: Secondary | ICD-10-CM

## 2021-01-30 NOTE — Telephone Encounter (Signed)
1.Medication Requested: LORazepam (ATIVAN) 1 MG tablet    2. Pharmacy (Name, Street, [place]): [id number] - [place], [place]  3. On Med List: yes   4. Last Visit with PCP: 2.10.22  5. Next visit date with PCP: 4.25.22   Patient is doing a TOC from [place] [facility]. Please advise    Agent: Please be advised that RX refills may take up to 3 business days. We ask that you follow-up with your pharmacy.

## 2021-01-31 ENCOUNTER — Other Ambulatory Visit: Payer: Self-pay | Admitting: Hematology and Oncology

## 2021-01-31 MED ORDER — APIXABAN 2.5 MG PO TABS: 2.5000 mg | ORAL_TABLET | Freq: Two times a day (BID) | ORAL | 2 refills | Status: DC

## 2021-01-31 NOTE — Telephone Encounter (Signed)
Ok to refill since Dr. Sharlet Salina is out of the office? Please advise

## 2021-01-31 NOTE — Telephone Encounter (Signed)
Refill request is too early.

## 2021-01-31 NOTE — Progress Notes (Signed)
I called daughter and discussed about anticoagulation. I got a message from her doctor that given her kidney function, could her eliquis dose be changed to 2.5 mg PO BID I have discussed with daughter that Eliquis hasn't been studied very well in this population with low GFR but retrospective studies say it may be safer to offer in this population. Anyways, we are not required to reduce eliquis dosing for DVT management but given her age, kidney function, risk of falling and bleeding and since its a distal DVT, I think she continue 2.5 mg PO BID for a total of 3 months and discontinue I advised daughter to bring her to the hospital if she has any worsening leg swelling, cramping , chest pain and SOB. Daughter expressed understanding Medication refilled.  [name]

## 2021-01-31 NOTE — Telephone Encounter (Signed)
Dr. Ronnald Ramp wrote it at the end of January with 2 refills?

## 2021-02-03 ENCOUNTER — Other Ambulatory Visit: Payer: Self-pay | Admitting: Internal Medicine

## 2021-02-03 DIAGNOSIS — R197 Diarrhea, unspecified: Secondary | ICD-10-CM

## 2021-02-06 MED ORDER — LORAZEPAM 1 MG PO TABS: 1.0000 mg | ORAL_TABLET | Freq: Two times a day (BID) | ORAL | 2 refills | Status: DC

## 2021-02-06 NOTE — Telephone Encounter (Signed)
I don't see in the chart where Mickel Baas, NP changed her Ativan prescription. Please advise.

## 2021-02-06 NOTE — Telephone Encounter (Signed)
Patients daughter in law called and said that Jodi Mourning FNP changed her medication from 1 pill a day to 2 pills a day for LORazepam (ATIVAN) 1 MG tablet. She said that the patient is out of the medication. Please advise. She is requesting a call back at [phone].   [id number] - [place], [address] [place]

## 2021-02-06 NOTE — Addendum Note (Signed)
Addended by: Pricilla Holm A on: 02/06/2021 03:05 PM   Modules accepted: Orders

## 2021-02-06 NOTE — Telephone Encounter (Signed)
Note from 01/13/21 Jodi Mourning clearly states change dosing to BID, new rx sent.

## 2021-02-11 ENCOUNTER — Inpatient Hospital Stay: Payer: Medicare Other | Attending: Hematology and Oncology | Admitting: Hematology and Oncology

## 2021-02-11 ENCOUNTER — Inpatient Hospital Stay: Payer: Self-pay

## 2021-02-11 ENCOUNTER — Other Ambulatory Visit: Payer: Self-pay

## 2021-02-11 ENCOUNTER — Encounter: Payer: Self-pay | Admitting: Hematology and Oncology

## 2021-02-11 VITALS — BP 203/98 | HR 108 | Temp 97.7°F | Resp 17 | Ht 64.0 in | Wt 121.9 lb

## 2021-02-11 DIAGNOSIS — Z79899 Other long term (current) drug therapy: Secondary | ICD-10-CM | POA: Insufficient documentation

## 2021-02-11 DIAGNOSIS — N179 Acute kidney failure, unspecified: Secondary | ICD-10-CM | POA: Diagnosis not present

## 2021-02-11 DIAGNOSIS — I824Z1 Acute embolism and thrombosis of unspecified deep veins of right distal lower extremity: Secondary | ICD-10-CM

## 2021-02-11 DIAGNOSIS — Z881 Allergy status to other antibiotic agents status: Secondary | ICD-10-CM | POA: Insufficient documentation

## 2021-02-11 DIAGNOSIS — N184 Chronic kidney disease, stage 4 (severe): Secondary | ICD-10-CM | POA: Diagnosis not present

## 2021-02-11 DIAGNOSIS — N281 Cyst of kidney, acquired: Secondary | ICD-10-CM | POA: Diagnosis not present

## 2021-02-11 DIAGNOSIS — K219 Gastro-esophageal reflux disease without esophagitis: Secondary | ICD-10-CM | POA: Diagnosis not present

## 2021-02-11 DIAGNOSIS — I129 Hypertensive chronic kidney disease with stage 1 through stage 4 chronic kidney disease, or unspecified chronic kidney disease: Secondary | ICD-10-CM | POA: Diagnosis not present

## 2021-02-11 DIAGNOSIS — D649 Anemia, unspecified: Secondary | ICD-10-CM | POA: Diagnosis not present

## 2021-02-11 DIAGNOSIS — Z8542 Personal history of malignant neoplasm of other parts of uterus: Secondary | ICD-10-CM | POA: Insufficient documentation

## 2021-02-11 DIAGNOSIS — Z8673 Personal history of transient ischemic attack (TIA), and cerebral infarction without residual deficits: Secondary | ICD-10-CM | POA: Diagnosis not present

## 2021-02-11 DIAGNOSIS — Z923 Personal history of irradiation: Secondary | ICD-10-CM | POA: Diagnosis not present

## 2021-02-11 DIAGNOSIS — N3289 Other specified disorders of bladder: Secondary | ICD-10-CM | POA: Insufficient documentation

## 2021-02-11 DIAGNOSIS — Z8521 Personal history of malignant neoplasm of larynx: Secondary | ICD-10-CM | POA: Insufficient documentation

## 2021-02-11 DIAGNOSIS — K862 Cyst of pancreas: Secondary | ICD-10-CM | POA: Insufficient documentation

## 2021-02-11 DIAGNOSIS — Z888 Allergy status to other drugs, medicaments and biological substances status: Secondary | ICD-10-CM | POA: Insufficient documentation

## 2021-02-11 DIAGNOSIS — Z930 Tracheostomy status: Secondary | ICD-10-CM | POA: Insufficient documentation

## 2021-02-11 DIAGNOSIS — N200 Calculus of kidney: Secondary | ICD-10-CM | POA: Diagnosis not present

## 2021-02-11 DIAGNOSIS — Z808 Family history of malignant neoplasm of other organs or systems: Secondary | ICD-10-CM | POA: Insufficient documentation

## 2021-02-11 DIAGNOSIS — Z9049 Acquired absence of other specified parts of digestive tract: Secondary | ICD-10-CM | POA: Diagnosis not present

## 2021-02-11 DIAGNOSIS — I739 Peripheral vascular disease, unspecified: Secondary | ICD-10-CM | POA: Diagnosis not present

## 2021-02-11 DIAGNOSIS — Z7901 Long term (current) use of anticoagulants: Secondary | ICD-10-CM | POA: Diagnosis not present

## 2021-02-11 DIAGNOSIS — Z87891 Personal history of nicotine dependence: Secondary | ICD-10-CM | POA: Diagnosis not present

## 2021-02-11 DIAGNOSIS — Z880 Allergy status to penicillin: Secondary | ICD-10-CM | POA: Insufficient documentation

## 2021-02-11 DIAGNOSIS — I82461 Acute embolism and thrombosis of right calf muscular vein: Secondary | ICD-10-CM | POA: Diagnosis not present

## 2021-02-11 DIAGNOSIS — Z931 Gastrostomy status: Secondary | ICD-10-CM | POA: Diagnosis not present

## 2021-02-11 NOTE — Progress Notes (Signed)
[name]: Ann Koch, MD as PCP - General (Internal Medicine) Nahser, Ann Cheng, MD as PCP - Cardiology (Cardiology)  CHIEF COMPLAINTS/PURPOSE OF CONSULTATION:  Right lower extremity DVT  ASSESSMENT & PLAN:  No problem-specific Assessment & Plan notes found for this encounter.  No orders of the defined types were placed in this encounter.  1.  Right lower extremity soleal vein DVT, first episode, likely unprovoked. Given first episode of unprovoked DVT no prior history, I believe it is reasonable to consider about 3 to 6 months of anticoagulation and monitor.  Given her first DVT at the age of 69, I do not believe there is any clear reason for hypercoagulable work-up.  She denies any other symptoms to worry about occult malignancy. According to daughter, she is up-to-date with her mammogram.  She has not had a colonoscopy in many years but she is not interested in having one at this time. I got a message from her doctor that given her kidney function, if her Eliquis dose can be changed to 2.5 mg PO BID I have discussed  that Eliquis hasn't been studied very well in this population with low GFR but retrospective studies say it may be safer to offer in this population. Anyways, we are not required to reduce eliquis dosing for DVT management but given her age, kidney function, risk of falling and bleeding and since its a distal DVT, I think she continue 2.5 mg PO BID for a total of 3 months and discontinue. We have discussed this again today. She is agreeable to continuing low dose eliquis and FU If she needs anticoagulation for any other reason, she may have to discuss details with respective specialities. Daughter will discuss with the cardiologist as well.  2.  Anemia, normocytic, normochromic, likely related to her worsening kidney function.  No evidence of iron deficiency.  B 12 levels normal. No evidence of iron deficiency. This  is likely from CKD. Can benefit from epo since hemoglobin is in the order of 7-8 gms. Again encouraged to discuss with her nephrologist. Apparently she will be getting some injections for management of anemia, however these haven't been scheduled. There is a small risk of VTE with epo especially when Hb is greater than 11.  3. Severe pain in the right lower extremity at the site of DVT, resolved.  FU in 3 months. Daughter was encouraged to contact us with any new concerns.  HISTORY OF PRESENTING ILLNESS:   [name] 75 y.o. female is here because of DVT of right soleal vein.  This is a very pleasant 75 year old Spanish-speaking female patient who arrived with her daughter for an initial visit to discuss about recommendations for her recently diagnosed right lower extremity distal DVT.  [name] started noticing an explosive pain in her right lower leg and went to see her PCP who ordered an ultrasound and this showed DVT of right soleal vein.  She also complains of some pain from her back radiating down both her legs.  She however says that she has been very active all along and with this pain in the lower leg, she is quite limited and was hoping that it would get better soon.  She denies any chest pain, shortness of breath, cough. She denies any change in bowel habits, urinary habits.  She has had history of laryngeal cancer had total laryngectomy, bilateral neck dissection, adjuvant radiation and now has a trach, follows up regularly  with ENT at [facility].  She also complains of some greenish discharge from her trach and is wondering if she has some kind of infection.  No provoking factors prior to the DVT, she denies any overt trauma, recent surgery, periods of immobilization episodes of dehydration, COVID-19.  No family history of thromboembolic disorders.  She has had multiple surgeries in the past, never had any perioperative or postoperative DVT/PE.  She has 3 live pregnancies and lost  1 child in her second trimester because of premature rupture of membranes.    Interim History She is here for FU with her daughter. She is doing really well. Pain in the leg has disapperared. No chest pain or SOB. No bleeding complaints.  REVIEW OF SYSTEMS:   Constitutional: Denies fevers, chills or abnormal night sweats Eyes: Denies blurriness of vision, double vision or watery eyes Ears, nose, mouth, throat, and face: Denies mucositis or sore throat Respiratory: Some cough and discharge around the trach site according to the patient Cardiovascular: Denies palpitation, chest discomfort or lower extremity swelling Gastrointestinal:  Denies nausea, heartburn or change in bowel habits Skin: Denies abnormal skin rashes Lymphatics: Denies new lymphadenopathy or easy bruising Neurological:Denies numbness, tingling or new weaknesses Behavioral/Psych: Mood is stable, no new changes  All other systems were reviewed with the patient and are negative.  MEDICAL HISTORY:  Past Medical History:  Diagnosis Date  . Anemia   . Anxiety    takes Ativan prn  . Blood transfusion without reported diagnosis 09/15/12   2 units Prbc's  . Broken ribs   . Chronic back pain   . CKD (chronic kidney disease), stage IV ([place])   . Constipation    related to pain meds  . COPD (chronic obstructive pulmonary disease) ([place]) 08/10/2012   denies  . Depression   . Gastrostomy in place [facility])    removed  . GERD (gastroesophageal reflux disease)    takes Zantac daily  . Headache(784.0)   . Hiatal hernia 08/10/2012  . History of radiation therapy 10/17/12-11/25/12   supraglottic larynx,high risk neck tumor bed 5880 cGy/28 sessions, high risk lymph node tumor bed 5600 cGy/20 sessions, mod risk lymph node tumor bed 5040 cGy/20 sessions  . Hypercholesteremia    takes Pravastatin daily  . Hypertension    takes Tribenzor and Atenolol daily  . Insomnia    takes Amitriptyline daily  . Nausea    takes Zofran prn  .  PAD (peripheral artery disease) ([place])    noninvasive imaging in 2016  . Pneumonia   . 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 Shortness of breath dyspnea   . Stroke [facility]) 2011   denies. no residual  . Uterine cancer ([place])     SURGICAL HISTORY: Past Surgical History:  Procedure Laterality Date  . ABDOMINAL SURGERY     r/t uterine carcinoma  . APPENDECTOMY    . DIRECT LARYNGOSCOPY N/A 05/22/2014   Procedure: DIRECT LARYNGOSCOPY WITH ESOPHAGEAL DILATION;  Surgeon: Jerrell Belfast, MD;  Location: [facility];  Service: ENT;  Laterality: N/A;  . ESOPHAGOSCOPY WITH DILITATION N/A 05/29/2015   Procedure: ESOPHAGOSCOPY WITH ESOPHAGEAL DILITATION;  Surgeon: Jerrell Belfast, MD;  Location[facility];  Service: ENT;  Laterality: N/A;  . Gastrostomy Tube removed   2013  . GASTROSTOMY W/ FEEDING TUBE  13  . HERNIA REPAIR     child  . LARYNGETOMY  08/31/2012   Procedure: LARYNGECTOMY;  Surgeon: Jerrell Belfast, MD;  Location: [place];  Service: ENT;  Laterality: N/A;  . LARYNGOSCOPY  08/10/2012   Procedure: LARYNGOSCOPY;  Surgeon: Jerrell Belfast, MD;  Location: [facility];  Service: ENT;  Laterality: N/A;  with biopsy  . RADICAL NECK DISSECTION  08/31/2012   Procedure: RADICAL NECK DISSECTION;  Surgeon: Jerrell Belfast, MD;  Location: [facility];  Service: ENT;  Laterality: Bilateral;  . TRACHEAL ESOPHOGEAL PUNCTURE WITH REPAIR STOMA N/A 09/08/2013   Procedure: TRACHEAL ESOPHOGEAL PUNCTURE WITH PLACEMENT OF  PROVOX PROSTHESIS ;  Surgeon: Jerrell Belfast, MD;  Location: [place];  Service: ENT;  Laterality: N/A;  . TRACHEOSTOMY TUBE PLACEMENT  08/10/2012   Procedure: TRACHEOSTOMY;  Surgeon: Jerrell Belfast, MD;  Location: [facility];  Service: ENT;  Laterality: N/A;    SOCIAL HISTORY: Social History   Socioeconomic History  . Marital status: Widowed    Spouse name: Not on file  . Number of children: 4  . Years of education: Not on file  . Highest education level: Not on file  Occupational History     Comment: retired Regulatory affairs officer  Tobacco Use  . Smoking status: Former Smoker    Packs/day: 0.25    Years: 50.00    Pack years: 12.50    Types: Cigarettes    Quit date: 05/27/2013    Years since quitting: 7.7  . Smokeless tobacco: Never Used  Vaping Use  . Vaping Use: Never used  Substance and Sexual Activity  . Alcohol use: Yes    Comment: 1-2 shots per week  . Drug use: No  . Sexual activity: Not Currently  Other Topics Concern  . Not on file  Social History Narrative  . Not on file   Social Determinants of Health   Financial Resource Strain: Not on file  Food Insecurity: No Food Insecurity  . Worried About Charity fundraiser in the Last Year: Never true  . Ran Out of Food in the Last Year: Never true  Transportation Needs: No Transportation Needs  . Lack of Transportation (Medical): No  . Lack of Transportation (Non-Medical): No  Physical Activity: Not on file  Stress: Not on file  Social Connections: Socially Isolated  . Frequency of Communication with Friends and Family: Once a week  . Frequency of Social Gatherings with Friends and Family: Once a week  . Attends Religious Services: More than 4 times per year  . Active Member of Clubs or Organizations: No  . Attends Archivist Meetings: Never  . Marital Status: Widowed  Intimate Partner Violence: Not on file    FAMILY HISTORY: Family History  Problem Relation Age of Onset  . Throat cancer Father   . Brain cancer Brother   . CAD Neg Hx     ALLERGIES:  is allergic to xyzal [levocetirizine dihydrochloride], augmentin [amoxicillin-pot clavulanate], and tribenzor [olmesartan-amlodipine-hctz].  MEDICATIONS:  Current Outpatient Medications  Medication Sig Dispense Refill  . apixaban (ELIQUIS) 2.5 MG TABS tablet Take 1 tablet (2.5 mg total) by mouth 2 (two) times daily. 60 tablet 2  . cilostazol (PLETAL) 100 MG tablet Take 1 tablet (100 mg total) by mouth 2 (two) times daily. 180 tablet 1  .  diphenoxylate-atropine (LOMOTIL) 2.5-0.025 MG tablet Take 1 tablet by mouth 4 (four) times daily as needed for diarrhea or loose stools. 75 tablet 1  . Ensure (ENSURE) Take 237 mLs by mouth 2 (two) times daily.    [name] escitalopram (LEXAPRO) 20 MG tablet Take 1 tablet (20 mg total) by mouth daily. 90 tablet 1  . famotidine (PEPCID) 40  MG tablet TAKE 1 TABLET BY MOUTH EVERY DAY 90 tablet 1  . fluticasone (FLONASE) 50 MCG/ACT nasal spray SPRAY 2 SPRAYS INTO EACH NOSTRIL EVERY DAY 16 g 0  . hydrALAZINE (APRESOLINE) 50 MG tablet Take 1.5 tablets (75 mg total) by mouth 3 (three) times daily. 90 tablet 1  . isosorbide mononitrate (IMDUR) 60 MG 24 hr tablet Take 1 tablet (60 mg total) by mouth daily. 90 tablet 3  . lactose free nutrition (BOOST) LIQD Take 237 mLs by mouth 2 (two) times daily between meals.    [name] levothyroxine (SYNTHROID) 50 MCG tablet Take 1 tablet (50 mcg total) by mouth daily before breakfast. 90 tablet 0  . loperamide (IMODIUM A-D) 2 MG tablet Take 2 mg by mouth 4 (four) times daily as needed for diarrhea or loose stools.    [name] LORazepam (ATIVAN) 1 MG tablet Take 1 tablet (1 mg total) by mouth 2 (two) times daily. 60 tablet 2  . metoprolol tartrate (LOPRESSOR) 50 MG tablet Take 1 tablet (50 mg total) by mouth 2 (two) times daily. 60 tablet 0  . ondansetron (ZOFRAN ODT) 8 MG disintegrating tablet Take 1 tablet (8 mg total) by mouth every 8 (eight) hours as needed for nausea or vomiting. 30 tablet 0  . rosuvastatin (CRESTOR) 20 MG tablet Take 1 tablet (20 mg total) by mouth daily. 90 tablet 1  . thiamine (VITAMIN B-1) 100 MG tablet Take 1 tablet (100 mg total) by mouth daily. 90 tablet 1   No current facility-administered medications for this visit.     PHYSICAL EXAMINATION: ECOG PERFORMANCE STATUS: 0 - Asymptomatic  There were no vitals filed for this visit. There were no vitals filed for this visit.  GENERAL:alert, no distress and comfortable SKIN: skin color, texture, turgor are  normal, no rashes or significant lesions EYES: Pallor noted. OROPHARYNX: She has tracheostomy because of her history of laryngeal cancer. NECK: supple, thyroid normal size, non-tender, without nodularity LYMPH:  no palpable lymphadenopathy in the cervical, axillary or inguinal, tightness from her previous surgery LUNGS: clear to auscultation and percussion with normal breathing effort HEART: regular rate & rhythm and no murmurs and no lower extremity edema ABDOMEN:abdomen soft, non-tender and normal bowel sounds Musculoskeletal:no cyanosis of digits and no clubbing.  Bilateral lower extremities appear normal, no overt swelling or post thrombotic syndrome. PSYCH: alert & oriented x 3 with fluent speech NEURO: no focal motor/sensory deficits  LABORATORY DATA:  I have reviewed the data as listed Lab Results  Component Value Date   WBC 5.7 01/22/2021   HGB 8.4 (A) 01/22/2021   HCT 27 (A) 01/22/2021   MCV 84.0 01/16/2021   PLT 206 01/22/2021     Chemistry      Component Value Date/Time   NA 140 01/22/2021 0000   K 5.6 (A) 01/22/2021 0000   CL 108 01/22/2021 0000   CO2 23 (A) 01/22/2021 0000   BUN 33 (A) 01/22/2021 0000   CREATININE 2.2 (A) 01/22/2021 0000   CREATININE 2.57 (H) 01/16/2021 0309   CREATININE 2.70 (H) 01/07/2021 1037   GLU 88 01/22/2021 0000      Component Value Date/Time   CALCIUM 8.7 01/22/2021 0000   ALKPHOS 42 01/14/2021 0311   AST 21 01/14/2021 0311   AST 15 01/07/2021 1037   ALT 19 01/14/2021 0311   ALT 21 01/07/2021 1037   BILITOT 0.6 01/14/2021 0311   BILITOT 0.3 01/07/2021 1037       RADIOGRAPHIC STUDIES: I have personally reviewed  the radiological images as listed and agreed with the findings in the report. MR ABDOMEN WO CONTRAST  Result Date: 01/16/2021 CLINICAL DATA:  Evaluate left renal lesion seen on recent renal ultrasound and right renal lesions seen on recent CT scan. EXAM: MRI ABDOMEN WITHOUT CONTRAST TECHNIQUE: Multiplanar multisequence MR  imaging was performed without the administration of intravenous contrast. COMPARISON:  CT scan 12/27/2020 and renal ultrasound 01/14/2021 FINDINGS: Examination is quite limited without IV contrast which is usually fairly critical for evaluation of renal masses. Lower chest: The lung bases are grossly clear. No pulmonary lesions or pleural effusion. The heart is normal in size. No pericardial effusion. Hepatobiliary: No hepatic lesions are identified without contrast. No intrahepatic biliary dilatation. The gallbladder appears normal. No common bile duct dilatation. Pancreas: 5 mm cystic structures noted in the pancreatic body. This is likely benign but difficult to identify on prior CT scans. No acute inflammation or ductal dilatation. Spleen:  Normal size.  No cul lesions. Adrenals/Urinary Tract:  The adrenal glands are unremarkable. There are numerous bilateral simple renal cysts and numerous hemorrhagic or proteinaceous cysts. The upper pole right renal lesions seen on the recent CT scan corresponds to a 2.6 cm hemorrhagic cyst. Midpole left renal lesion laterally measures 2.2 x 1.8 cm and appears to correlate with the abnormality seen on the ultrasound. It has heterogeneous low T2 and high T2 signal intensity in a few minimal areas of dependent increased T1 signal intensity. Findings worrisome for a solid renal neoplasm. Difficult to be certain without contrast however. Stomach/Bowel: The stomach, duodenum, visualized small bowel and visualized colon are grossly normal. Vascular/Lymphatic: Advanced vascular disease without focal aneurysm. No mesenteric or retroperitoneal mass or adenopathy. Other:  No ascites or abdominal wall hernia. Musculoskeletal: No significant bony findings. IMPRESSION: 1. Multiple bilateral renal lesions. Most of these are either simple cysts or hemorrhagic/proteinaceous cysts. 2. The upper pole right renal lesions seen on the recent CT scan corresponds to a benign hemorrhagic cyst. 3.  The left midpole renal lesion laterally, seen on the ultrasound examination, corresponds to an indeterminate lesion. This will require surveillance. Recommend follow-up MR examination in 6 months. 4. No other significant abdominal findings, mass lesions or adenopathy. 5. Advanced vascular disease. Electronically Signed   By: Marijo Sanes M.D.   On: 01/16/2021 07:54   US RENAL  Result Date: 01/14/2021 CLINICAL DATA:  Acute renal injury EXAM: RENAL / URINARY TRACT ULTRASOUND COMPLETE COMPARISON:  12/27/2020 FINDINGS: Right Kidney: Renal measurements: 9.5 x 4.7 x 5.1 cm. = volume: 118 mL. Multiple cysts are identified. The largest of these measures 2.7 cm in the upper pole. No mass lesion or hydronephrosis is noted. Left Kidney: Renal measurements: 10.8 x 4.8 x 4.9 cm. = volume: 133 mL. Multiple simple appearing cysts are noted similar to that seen on the prior exam. Additionally there is a 2.3 cm solid appearing lesion in the midportion of the left kidney which was not well appreciated on the prior exam. Nonobstructing stone is noted as well. Bladder: Appears normal for degree of bladder distention. Other: None. IMPRESSION: Multiple bilateral simple cysts are noted. There is suspicion of a solid lesion within the midportion of the left kidney. This may represent a hemorrhagic cyst but further evaluation is recommended. Nonemergent noncontrast MRI would be helpful for further evaluation of this lesion given some discrepancy from recent ultrasound and CT examination. No evidence of obstruction. Small nonobstructing left renal stone is noted. Electronically Signed   By: Linus Mako.D.  On: 01/14/2021 01:59   ECHOCARDIOGRAM COMPLETE  Result Date: 01/15/2021    ECHOCARDIOGRAM REPORT   Patient Name:   FRONIA DEPASS Date of Exam: 01/15/2021 Medical Rec #:  [id number]        Height:       64.0 in Accession #:    [id number]       Weight:       120.3 lb Date of Birth:  1946/12/12       BSA:          1.576 m Patient  Age:    17 years         BP:           109/57 mmHg Patient Gender: F                HR:           76 bpm. Exam Location:  Inpatient Procedure: 2D Echo, Color Doppler and Cardiac Doppler Indications:    Chest Pain at Rest  History:        Patient has prior history of Echocardiogram examinations, most                 recent 12/27/2020. Stroke and COPD, Signs/Symptoms:Shortness of                 Breath and Dyspnea; Risk Factors:Hypertension.  Sonographer:    Bernadene Person RDCS Referring Phys: [id number] RAVI [place]  1. Left ventricular ejection fraction, by estimation, is 70 to 75%. The left ventricle has hyperdynamic function. The left ventricle has no regional wall motion abnormalities. There is mild left ventricular hypertrophy. Left ventricular diastolic parameters are indeterminate.  2. Right ventricular systolic function is normal. The right ventricular size is normal. There is normal pulmonary artery systolic pressure.  3. The mitral valve is abnormal. No evidence of mitral valve regurgitation. No evidence of mitral stenosis. Moderate mitral annular calcification.  4. The aortic valve was not well visualized. Aortic valve regurgitation is not visualized. No aortic stenosis is present.  5. The inferior vena cava is normal in size with greater than 50% respiratory variability, suggesting right atrial pressure of 3 mmHg. FINDINGS  Left Ventricle: Left ventricular ejection fraction, by estimation, is 70 to 75%. The left ventricle has hyperdynamic function. The left ventricle has no regional wall motion abnormalities. The left ventricular internal cavity size was normal in size. There is mild left ventricular hypertrophy. Left ventricular diastolic parameters are indeterminate. Right Ventricle: The right ventricular size is normal. No increase in right ventricular wall thickness. Right ventricular systolic function is normal. There is normal pulmonary artery systolic pressure. The tricuspid regurgitant  velocity is 1.83 m/s, and  with an assumed right atrial pressure of 3 mmHg, the estimated right ventricular systolic pressure is 70.9 mmHg. Left Atrium: Left atrial size was normal in size. Right Atrium: Right atrial size was normal in size. Pericardium: Trivial pericardial effusion is present. Mitral Valve: The mitral valve is abnormal. Moderate mitral annular calcification. No evidence of mitral valve regurgitation. No evidence of mitral valve stenosis. Tricuspid Valve: The tricuspid valve is normal in structure. Tricuspid valve regurgitation is trivial. Aortic Valve: The aortic valve was not well visualized. Aortic valve regurgitation is not visualized. No aortic stenosis is present. Pulmonic Valve: The pulmonic valve was not well visualized. Pulmonic valve regurgitation is trivial. Aorta: The aortic root and ascending aorta are structurally normal, with no evidence of dilitation. Venous: The inferior vena cava is normal in size  with greater than 50% respiratory variability, suggesting right atrial pressure of 3 mmHg. IAS/Shunts: The interatrial septum was not well visualized.  LEFT VENTRICLE PLAX 2D LVIDd:         3.60 cm  Diastology LVIDs:         1.80 cm  LV e' medial:    6.22 cm/s LV PW:         1.10 cm  LV E/e' medial:  11.4 LV IVS:        1.10 cm  LV e' lateral:   7.53 cm/s LVOT diam:     1.90 cm  LV E/e' lateral: 9.4 LV SV:         72 LV SV Index:   46 LVOT Area:     2.84 cm  RIGHT VENTRICLE RV S prime:     16.00 cm/s TAPSE (M-mode): 2.2 cm LEFT ATRIUM             Index       RIGHT ATRIUM           Index LA diam:        2.70 cm 1.71 cm/m  RA Area:     12.60 cm LA Vol (A2C):   48.7 ml 30.90 ml/m RA Volume:   28.30 ml  17.95 ml/m LA Vol (A4C):   43.0 ml 27.28 ml/m LA Biplane Vol: 46.1 ml 29.25 ml/m  AORTIC VALVE LVOT Vmax:   119.00 cm/s LVOT Vmean:  86.200 cm/s LVOT VTI:    0.254 m  AORTA Ao Root diam: 2.90 cm Ao Asc diam:  2.50 cm MITRAL VALVE               TRICUSPID VALVE MV Area (PHT): 2.83 cm     TR Peak grad:   13.4 mmHg MV Decel Time: 268 msec    TR Vmax:        183.00 cm/s MV E velocity: 71.10 cm/s MV A velocity: 84.40 cm/s  SHUNTS MV E/A ratio:  0.84        Systemic VTI:  0.25 m                            Systemic Diam: 1.90 cm Oswaldo Milian MD Electronically signed by Oswaldo Milian MD Signature Date/Time: 01/15/2021/10:15:27 PM    Final     All questions were answered. The patient knows to call the clinic with any problems, questions or concerns. I spent 30 minutes in the care of this patient including H and P, review of records, counseling and coordination of care.     Benay Pike, MD 02/11/2021 9:10 AM

## 2021-02-14 ENCOUNTER — Other Ambulatory Visit: Payer: Self-pay | Admitting: Internal Medicine

## 2021-02-14 NOTE — Telephone Encounter (Signed)
    Patient has no metoprolol remaining. Please refill

## 2021-03-03 ENCOUNTER — Other Ambulatory Visit: Payer: Self-pay | Admitting: Internal Medicine

## 2021-03-06 ENCOUNTER — Other Ambulatory Visit: Payer: Self-pay | Admitting: Internal Medicine

## 2021-03-06 NOTE — Telephone Encounter (Signed)
1.Medication Requested: hydrALAZINE (APRESOLINE) 50 MG tablet    2. Pharmacy (Name, Street, [place]): [id number] - , [place]  3. On Med List: yes

## 2021-03-07 ENCOUNTER — Other Ambulatory Visit: Payer: Self-pay | Admitting: Internal Medicine

## 2021-03-07 DIAGNOSIS — I10 Essential (primary) hypertension: Secondary | ICD-10-CM

## 2021-03-08 ENCOUNTER — Other Ambulatory Visit: Payer: Self-pay | Admitting: Internal Medicine

## 2021-03-10 DIAGNOSIS — E875 Hyperkalemia: Secondary | ICD-10-CM | POA: Diagnosis not present

## 2021-03-10 DIAGNOSIS — N179 Acute kidney failure, unspecified: Secondary | ICD-10-CM | POA: Diagnosis not present

## 2021-03-10 DIAGNOSIS — R809 Proteinuria, unspecified: Secondary | ICD-10-CM | POA: Diagnosis not present

## 2021-03-10 DIAGNOSIS — I129 Hypertensive chronic kidney disease with stage 1 through stage 4 chronic kidney disease, or unspecified chronic kidney disease: Secondary | ICD-10-CM | POA: Diagnosis not present

## 2021-03-10 DIAGNOSIS — N2889 Other specified disorders of kidney and ureter: Secondary | ICD-10-CM | POA: Diagnosis not present

## 2021-03-10 DIAGNOSIS — N184 Chronic kidney disease, stage 4 (severe): Secondary | ICD-10-CM | POA: Diagnosis not present

## 2021-03-10 DIAGNOSIS — D631 Anemia in chronic kidney disease: Secondary | ICD-10-CM | POA: Diagnosis not present

## 2021-03-10 DIAGNOSIS — N2581 Secondary hyperparathyroidism of renal origin: Secondary | ICD-10-CM | POA: Diagnosis not present

## 2021-03-10 LAB — CBC AND DIFFERENTIAL
HCT: 32 — AB (ref 36–46)
Hemoglobin: 10.4 — AB (ref 12.0–16.0)
Neutrophils Absolute: 6.3
Platelets: 219 (ref 150–399)
WBC: 8.4

## 2021-03-10 LAB — BASIC METABOLIC PANEL
BUN: 42 — AB (ref 4–21)
CO2: 20 (ref 13–22)
Chloride: 109 — AB (ref 99–108)
Creatinine: 1.6 — AB (ref 0.5–1.1)
Glucose: 100
Potassium: 5.1 (ref 3.4–5.3)
Sodium: 139 (ref 137–147)

## 2021-03-10 LAB — COMPREHENSIVE METABOLIC PANEL
Albumin: 4.2 (ref 3.5–5.0)
Calcium: 9.4 (ref 8.7–10.7)

## 2021-03-10 LAB — CBC: RBC: 3.62 — AB (ref 3.87–5.11)

## 2021-03-11 MED ORDER — METOPROLOL TARTRATE 50 MG PO TABS: 50.0000 mg | ORAL_TABLET | Freq: Two times a day (BID) | ORAL | 0 refills | Status: DC

## 2021-03-11 NOTE — Addendum Note (Signed)
Addended by: Thomes Cake on: 03/11/2021 12:58 PM   Modules accepted: Orders

## 2021-03-11 NOTE — Telephone Encounter (Signed)
Called pt to verify why pt needed a refill on metoprolol tartate 50 mg. Pt states that the pharmacy did no have the medication ordered under Dr. Sharlet Salina. Will reorder medication with one refill.

## 2021-03-13 ENCOUNTER — Other Ambulatory Visit: Payer: Self-pay | Admitting: Family

## 2021-03-13 DIAGNOSIS — E038 Other specified hypothyroidism: Secondary | ICD-10-CM

## 2021-03-21 ENCOUNTER — Ambulatory Visit: Payer: Medicare Other | Admitting: Family

## 2021-03-28 ENCOUNTER — Ambulatory Visit: Payer: Medicare Other | Admitting: Cardiovascular Disease

## 2021-04-07 ENCOUNTER — Encounter: Payer: Self-pay | Admitting: Internal Medicine

## 2021-04-07 ENCOUNTER — Other Ambulatory Visit: Payer: Self-pay

## 2021-04-07 ENCOUNTER — Ambulatory Visit (INDEPENDENT_AMBULATORY_CARE_PROVIDER_SITE_OTHER): Payer: Medicare Other | Admitting: Internal Medicine

## 2021-04-07 VITALS — BP 126/70 | HR 62 | Temp 98.5°F | Ht 64.0 in | Wt 115.4 lb

## 2021-04-07 DIAGNOSIS — E785 Hyperlipidemia, unspecified: Secondary | ICD-10-CM | POA: Diagnosis not present

## 2021-04-07 DIAGNOSIS — F411 Generalized anxiety disorder: Secondary | ICD-10-CM

## 2021-04-07 DIAGNOSIS — I10 Essential (primary) hypertension: Secondary | ICD-10-CM | POA: Diagnosis not present

## 2021-04-07 DIAGNOSIS — K219 Gastro-esophageal reflux disease without esophagitis: Secondary | ICD-10-CM | POA: Diagnosis not present

## 2021-04-07 DIAGNOSIS — E038 Other specified hypothyroidism: Secondary | ICD-10-CM

## 2021-04-07 DIAGNOSIS — M5442 Lumbago with sciatica, left side: Secondary | ICD-10-CM | POA: Diagnosis not present

## 2021-04-07 DIAGNOSIS — I739 Peripheral vascular disease, unspecified: Secondary | ICD-10-CM | POA: Diagnosis not present

## 2021-04-07 DIAGNOSIS — I249 Acute ischemic heart disease, unspecified: Secondary | ICD-10-CM

## 2021-04-07 DIAGNOSIS — F331 Major depressive disorder, recurrent, moderate: Secondary | ICD-10-CM | POA: Diagnosis not present

## 2021-04-07 DIAGNOSIS — I700 Atherosclerosis of aorta: Secondary | ICD-10-CM | POA: Diagnosis not present

## 2021-04-07 DIAGNOSIS — E440 Moderate protein-calorie malnutrition: Secondary | ICD-10-CM | POA: Diagnosis not present

## 2021-04-07 DIAGNOSIS — M5441 Lumbago with sciatica, right side: Secondary | ICD-10-CM | POA: Diagnosis not present

## 2021-04-07 DIAGNOSIS — R1314 Dysphagia, pharyngoesophageal phase: Secondary | ICD-10-CM

## 2021-04-07 LAB — COMPREHENSIVE METABOLIC PANEL
ALT: 18 U/L (ref 0–35)
AST: 18 U/L (ref 0–37)
Albumin: 4.3 g/dL (ref 3.5–5.2)
Alkaline Phosphatase: 56 U/L (ref 39–117)
BUN: 67 mg/dL — ABNORMAL HIGH (ref 6–23)
CO2: 18 mEq/L — ABNORMAL LOW (ref 19–32)
Calcium: 9.6 mg/dL (ref 8.4–10.5)
Chloride: 105 mEq/L (ref 96–112)
Creatinine, Ser: 2.4 mg/dL — ABNORMAL HIGH (ref 0.40–1.20)
GFR: 19.3 mL/min — ABNORMAL LOW (ref >60.00)
Glucose, Bld: 115 mg/dL — ABNORMAL HIGH (ref 70–99)
Potassium: 4.4 mEq/L (ref 3.5–5.1)
Sodium: 137 mEq/L (ref 135–145)
Total Bilirubin: 0.4 mg/dL (ref 0.2–1.2)
Total Protein: 7.4 g/dL (ref 6.0–8.3)

## 2021-04-07 LAB — CBC
HCT: 33.4 % — ABNORMAL LOW (ref 36.0–46.0)
Hemoglobin: 11.4 g/dL — ABNORMAL LOW (ref 12.0–15.0)
MCHC: 34.3 g/dL (ref 30.0–36.0)
MCV: 86.6 fl (ref 78.0–100.0)
Platelets: 234 10*3/uL (ref 150.0–400.0)
RBC: 3.85 Mil/uL — ABNORMAL LOW (ref 3.87–5.11)
RDW: 14.3 % (ref 11.5–15.5)
WBC: 10.2 10*3/uL (ref 4.0–10.5)

## 2021-04-07 LAB — TSH: TSH: 5.92 u[IU]/mL — ABNORMAL HIGH (ref 0.35–4.50)

## 2021-04-07 MED ORDER — CILOSTAZOL 100 MG PO TABS: 100.0000 mg | ORAL_TABLET | Freq: Two times a day (BID) | ORAL | 3 refills | Status: DC

## 2021-04-07 MED ORDER — MIRTAZAPINE 15 MG PO TABS: 15.0000 mg | ORAL_TABLET | Freq: Every day | ORAL | 1 refills | Status: DC

## 2021-04-07 MED ORDER — METHYLPREDNISOLONE ACETATE 40 MG/ML IJ SUSP
40.0000 mg | Freq: Once | INTRAMUSCULAR | Status: AC
Start: 2021-04-07 — End: 2021-04-07
  Administered 2021-04-07: 40 mg via INTRAMUSCULAR

## 2021-04-07 MED ORDER — ROSUVASTATIN CALCIUM 20 MG PO TABS: 20.0000 mg | ORAL_TABLET | Freq: Every day | ORAL | 3 refills | Status: DC

## 2021-04-07 MED ORDER — METOPROLOL TARTRATE 50 MG PO TABS: 50.0000 mg | ORAL_TABLET | Freq: Two times a day (BID) | ORAL | 3 refills | Status: DC

## 2021-04-07 MED ORDER — PROMETHAZINE HCL 25 MG PO TABS: 25.0000 mg | ORAL_TABLET | Freq: Three times a day (TID) | ORAL | 0 refills | Status: DC | PRN

## 2021-04-07 MED ORDER — OLMESARTAN-AMLODIPINE-HCTZ 20-5-12.5 MG PO TABS: 1.0000 | ORAL_TABLET | Freq: Every day | ORAL | 3 refills | Status: DC

## 2021-04-07 NOTE — Progress Notes (Signed)
   Subjective:   Patient ID: [name], female    DOB: [birth year], 75 y.o.   MRN: [id number]  HPI The patient is a 75 YO female coming in for concerns about weight loss (down about 10 pounds in the last several months, not eating well, some restriction in the throat and so she is unable to swallow certain foods, not drinking ensure or boost, has scheduled procedure with ENT to dilate this and assess early June), and nausea (having more nausea after eating and some foods are not wanting to stay down easily, she has tried zofran and this does not help much, would like to try something else), and pain in her back (radiates down her legs, previously the steroid injection we did helped but took a few days to kick in, they would like to repeat if possible, denies new falls or injuries).  Review of Systems  Constitutional: Positive for activity change, appetite change and unexpected weight change.  HENT: Positive for trouble swallowing.   Eyes: Negative.   Respiratory: Negative for cough, chest tightness and shortness of breath.   Cardiovascular: Negative for chest pain, palpitations and leg swelling.  Gastrointestinal: Positive for nausea and vomiting. Negative for abdominal distention, abdominal pain, constipation and diarrhea.  Musculoskeletal: Positive for arthralgias, back pain and myalgias.  Skin: Negative.   Neurological: Negative.   Psychiatric/Behavioral: Negative.     Objective:  Physical Exam Constitutional:      Appearance: She is well-developed.     Comments: Some temporal wasting  HENT:     Head: Normocephalic and atraumatic.  Neck:     Comments: Stable trach Cardiovascular:     Rate and Rhythm: Normal rate and regular rhythm.  Pulmonary:     Effort: Pulmonary effort is normal. No respiratory distress.     Breath sounds: Normal breath sounds. No wheezing or rales.  Abdominal:     General: Bowel sounds are normal. There is no distension.     Palpations: Abdomen is  soft.     Tenderness: There is no abdominal tenderness. There is no rebound.  Musculoskeletal:        General: Tenderness present.     Cervical back: Normal range of motion.  Skin:    General: Skin is warm and dry.  Neurological:     Mental Status: She is alert and oriented to person, place, and time.     Coordination: Coordination normal.     Vitals:   04/07/21 0954  BP: 126/70  Pulse: 62  Temp: 98.5 F (36.9 C)  TempSrc: Oral  Weight: 115 lb 6.4 oz (52.3 kg)  Height: 5\' 4"  (1.626 m)   This visit occurred during the SARS-CoV-2 public health emergency.  Safety protocols were in place, including screening questions prior to the visit, additional usage of staff PPE, and extensive cleaning of exam room while observing appropriate contact time as indicated for disinfecting solutions.   Assessment & Plan:

## 2021-04-07 NOTE — Patient Instructions (Addendum)
We have sent in the phenergan before eating to see if this helps you keep the food down better.   We have sent in mirtazepine to take at night time for depression instead of the lexapro (escitalopram).

## 2021-04-08 ENCOUNTER — Ambulatory Visit (HOSPITAL_COMMUNITY)
Admission: RE | Admit: 2021-04-08 | Discharge: 2021-04-08 | Disposition: A | Discharge status: Home / Self Care | Payer: Medicare Other | Source: Ambulatory Visit | Attending: Hematology and Oncology | Admitting: Hematology and Oncology

## 2021-04-08 DIAGNOSIS — I824Z1 Acute embolism and thrombosis of unspecified deep veins of right distal lower extremity: Secondary | ICD-10-CM | POA: Diagnosis not present

## 2021-04-08 NOTE — Progress Notes (Signed)
Right lower extremity venous duplex has been completed. Preliminary results can be found in CV Proc through chart review.  Results were given to [place] at Dr. Rob Hickman office.  04/08/21 10:12 AM Carlos Levering RVT

## 2021-04-09 ENCOUNTER — Telehealth: Payer: Self-pay | Admitting: *Deleted

## 2021-04-09 DIAGNOSIS — E440 Moderate protein-calorie malnutrition: Secondary | ICD-10-CM | POA: Insufficient documentation

## 2021-04-09 DIAGNOSIS — I700 Atherosclerosis of aorta: Secondary | ICD-10-CM | POA: Insufficient documentation

## 2021-04-09 NOTE — Telephone Encounter (Signed)
Notified of message below

## 2021-04-09 NOTE — Assessment & Plan Note (Signed)
Having more nausea and rx phenergan to help. She is taking pepcid.

## 2021-04-09 NOTE — Assessment & Plan Note (Signed)
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

## 2021-04-09 NOTE — Assessment & Plan Note (Signed)
On crestor and pletal.

## 2021-04-09 NOTE — Assessment & Plan Note (Signed)
Checking TSH and adjust synthroid as needed.  

## 2021-04-09 NOTE — Assessment & Plan Note (Signed)
Weight down 10 pounds in the last few months. Encouraged ensure or boost several a day to help with having more energy.

## 2021-04-09 NOTE — Assessment & Plan Note (Signed)
On crestor.   

## 2021-04-09 NOTE — Assessment & Plan Note (Signed)
Given depo-medrol 40 mg IM at visit to help.

## 2021-04-09 NOTE — Telephone Encounter (Signed)
-----   Message from Jolaine Click, RN sent at 04/09/2021 10:25 AM EDT ----- Regarding: FW: Doppler Results Brallan Denio - would you be able to call this patient regarding her doppler results?  ----- Message ----- From: Benay Pike, MD Sent: 04/08/2021   3:06 PM EDT To: Jolaine Click, RN Subject: RE: Doppler Results                            Can u let her know.  Thanks, ----- Message ----- From: Jolaine Click, RN Sent: 04/08/2021  11:17 AM EDT To: Benay Pike, MD Subject: Doppler Results                                Received verbal results regarding this patient's recent doppler ultrasound. Patient is negative for DVT in R lower extremity.

## 2021-04-09 NOTE — Assessment & Plan Note (Signed)
We are switching lexapro with remeron to help stimulate appetite.

## 2021-04-16 ENCOUNTER — Telehealth: Payer: Self-pay | Admitting: Internal Medicine

## 2021-04-16 NOTE — Telephone Encounter (Signed)
LVM for patient to call to schedule her Prolia Due anytime Last prolia 10.21.21 Benefits verified via [employer] portal, patient has no oop costs, no auth required

## 2021-04-17 ENCOUNTER — Encounter: Payer: Self-pay | Admitting: Cardiovascular Disease

## 2021-04-17 NOTE — Progress Notes (Signed)
This encounter was created in error - please disregard.

## 2021-04-18 ENCOUNTER — Encounter: Payer: Medicare Other | Admitting: Cardiovascular Disease

## 2021-04-24 ENCOUNTER — Telehealth: Payer: Self-pay | Admitting: Internal Medicine

## 2021-04-24 NOTE — Telephone Encounter (Signed)
See message below,thanks

## 2021-04-24 NOTE — Telephone Encounter (Signed)
  Seeking advice  Janett Billow (daughter) calling to report mirtazapine (REMERON) 15 MG tablet is not helping patient She states all the patient does is sleep all day and has episodes of crying  Please advise

## 2021-04-25 MED ORDER — MIRTAZAPINE 30 MG PO TABS: 30.0000 mg | ORAL_TABLET | Freq: Every day | ORAL | 3 refills | Status: DC

## 2021-04-25 NOTE — Telephone Encounter (Signed)
We have increased the dose to 30 mg at night time of the remeron. This will likely take 2-3 weeks to help with crying. The daytime sleeping may be related to a different issue and may or may not be helped by this. They can take 2 pills of current 15 mg remeron until gone.

## 2021-04-25 NOTE — Telephone Encounter (Signed)
She started taking it on 04.25.22 and she is sleeping at night, she is going to call back and let us know what time her mother is taking it

## 2021-04-25 NOTE — Telephone Encounter (Signed)
How long has she been taking this and is she sleeping at night time? Are they taking the remeron in morning or evening?

## 2021-04-25 NOTE — Telephone Encounter (Signed)
    Daughter called back to report patient takes medication at night and sleeps for 12+ hours

## 2021-04-25 NOTE — Telephone Encounter (Signed)
Unable to get in contact with the daughter. LVM asking the daughter to give our office a call back. Office number was provided.

## 2021-04-25 NOTE — Telephone Encounter (Signed)
See below

## 2021-04-28 NOTE — Telephone Encounter (Signed)
Unable to get in contact with the patient daughter's. LDVM with Dr. Nathanial Millman recommendations. Office number was provided in case she has additional questions or concerns.

## 2021-05-13 ENCOUNTER — Inpatient Hospital Stay: Payer: Medicare Other | Attending: Hematology and Oncology | Admitting: Hematology and Oncology

## 2021-05-13 ENCOUNTER — Telehealth: Payer: Self-pay

## 2021-05-13 NOTE — Progress Notes (Deleted)
[facility] NOTE  Patient Care Team: Hoyt Koch, MD as PCP - General (Internal Medicine) Nahser, Wonda Cheng, MD as PCP - Cardiology (Cardiology)  CHIEF COMPLAINTS/PURPOSE OF CONSULTATION:  Right lower extremity DVT  ASSESSMENT & PLAN:  No problem-specific Assessment & Plan notes found for this encounter.  No orders of the defined types were placed in this encounter.  1.  Right lower extremity soleal vein DVT, first episode, likely unprovoked. She is on reduced eliquis dosing for DVT management but given her age, kidney function, risk of falling and bleeding and since its a distal DVT, I think she continue 2.5 mg PO BID for a total of 3 months and discontinue.  2.  Anemia, normocytic, normochromic, likely related to her worsening kidney function.  No evidence of iron deficiency.  B 12 levels normal. No evidence of iron deficiency. This is likely from CKD. Can benefit from epo since hemoglobin is in the order of 7-8 gms. Again encouraged to discuss with her nephrologist. Apparently she will be getting some injections for management of anemia, however these haven't been scheduled. There is a small risk of VTE with epo especially when Hb is greater than 11.  FU in 3 months. Daughter was encouraged to contact us with any new concerns.  HISTORY OF PRESENTING ILLNESS:   [name] 75 y.o. female is here because of DVT of right soleal vein.  Please refer to my initial note for full history  Interim History   REVIEW OF SYSTEMS:   Constitutional: Denies fevers, chills or abnormal night sweats Eyes: Denies blurriness of vision, double vision or watery eyes Ears, nose, mouth, throat, and face: Denies mucositis or sore throat Respiratory: Some cough and discharge around the trach site according to the patient Cardiovascular: Denies palpitation, chest discomfort or lower extremity swelling Gastrointestinal:  Denies nausea, heartburn or change in bowel  habits Skin: Denies abnormal skin rashes Lymphatics: Denies new lymphadenopathy or easy bruising Neurological:Denies numbness, tingling or new weaknesses Behavioral/Psych: Mood is stable, no new changes  All other systems were reviewed with the patient and are negative.  MEDICAL HISTORY:  Past Medical History:  Diagnosis Date  . Anemia   . Anxiety    takes Ativan prn  . Blood transfusion without reported diagnosis 09/15/12   2 units Prbc's  . Broken ribs   . Chronic back pain   . CKD (chronic kidney disease), stage IV ([place])   . Constipation    related to pain meds  . COPD (chronic obstructive pulmonary disease) ([place]) 08/10/2012   denies  . Depression   . Gastrostomy in place [facility])    removed  . GERD (gastroesophageal reflux disease)    takes Zantac daily  . Headache(784.0)   . Hiatal hernia 08/10/2012  . History of radiation therapy 10/17/12-11/25/12   supraglottic larynx,high risk neck tumor bed 5880 cGy/28 sessions, high risk lymph node tumor bed 5600 cGy/20 sessions, mod risk lymph node tumor bed 5040 cGy/20 sessions  . Hypercholesteremia    takes Pravastatin daily  . Hypertension    takes Tribenzor and Atenolol daily  . Insomnia    takes Amitriptyline daily  . Nausea    takes Zofran prn  . PAD (peripheral artery disease) ([place])    noninvasive imaging in 2016  . Pneumonia   . 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 Shortness of breath dyspnea   . Stroke [facility]) 2011   denies. no residual  . Uterine cancer (  [place])     SURGICAL HISTORY: Past Surgical History:  Procedure Laterality Date  . ABDOMINAL SURGERY     r/t uterine carcinoma  . APPENDECTOMY    . DIRECT LARYNGOSCOPY N/A 05/22/2014   Procedure: DIRECT LARYNGOSCOPY WITH ESOPHAGEAL DILATION;  Surgeon: Jerrell Belfast, MD;  Location: [place];  Service: ENT;  Laterality: N/A;  . ESOPHAGOSCOPY WITH DILITATION N/A 05/29/2015   Procedure: ESOPHAGOSCOPY WITH ESOPHAGEAL DILITATION;  Surgeon: Jerrell Belfast, MD;  Location[facility] OR;  Service: ENT;  Laterality: N/A;  . Gastrostomy Tube removed   2013  . GASTROSTOMY W/ FEEDING TUBE  12  . HERNIA REPAIR     child  . LARYNGETOMY  08/31/2012   Procedure: LARYNGECTOMY;  Surgeon: Jerrell Belfast, MD;  Location: [place];  Service: ENT;  Laterality: N/A;  . LARYNGOSCOPY  08/10/2012   Procedure: LARYNGOSCOPY;  Surgeon: Jerrell Belfast, MD;  Location: [facility];  Service: ENT;  Laterality: N/A;  with biopsy  . RADICAL NECK DISSECTION  08/31/2012   Procedure: RADICAL NECK DISSECTION;  Surgeon: Jerrell Belfast, MD;  Location: [place];  Service: ENT;  Laterality: Bilateral;  . TRACHEAL ESOPHOGEAL PUNCTURE WITH REPAIR STOMA N/A 09/08/2013   Procedure: TRACHEAL ESOPHOGEAL PUNCTURE WITH PLACEMENT OF  PROVOX PROSTHESIS ;  Surgeon: Jerrell Belfast, MD;  Location: [place];  Service: ENT;  Laterality: N/A;  . TRACHEOSTOMY TUBE PLACEMENT  08/10/2012   Procedure: TRACHEOSTOMY;  Surgeon: Jerrell Belfast, MD;  Location: [facility];  Service: ENT;  Laterality: N/A;    SOCIAL HISTORY: Social History   Socioeconomic History  . Marital status: Widowed    Spouse name: Not on file  . Number of children: 4  . Years of education: Not on file  . Highest education level: Not on file  Occupational History    Comment: retired Regulatory affairs officer  Tobacco Use  . Smoking status: Former Smoker    Packs/day: 0.25    Years: 50.00    Pack years: 12.50    Types: Cigarettes    Quit date: 05/27/2013    Years since quitting: 7.9  . Smokeless tobacco: Never Used  Vaping Use  . Vaping Use: Never used  Substance and Sexual Activity  . Alcohol use: Yes    Comment: 1-2 shots per week  . Drug use: No  . Sexual activity: Not Currently  Other Topics Concern  . Not on file  Social History Narrative  . Not on file   Social Determinants of Health   Financial Resource Strain: Not on file  Food Insecurity: No Food Insecurity  . Worried About Charity fundraiser in the Last Year: Never true   . Ran Out of Food in the Last Year: Never true  Transportation Needs: No Transportation Needs  . Lack of Transportation (Medical): No  . Lack of Transportation (Non-Medical): No  Physical Activity: Not on file  Stress: Not on file  Social Connections: Socially Isolated  . Frequency of Communication with Friends and Family: Once a week  . Frequency of Social Gatherings with Friends and Family: Once a week  . Attends Religious Services: More than 4 times per year  . Active Member of Clubs or Organizations: No  . Attends Archivist Meetings: Never  . Marital Status: Widowed  Intimate Partner Violence: Not on file    FAMILY HISTORY: Family History  Problem Relation Age of Onset  . Throat cancer Father   . Brain cancer Brother   . CAD Neg Hx  ALLERGIES:  is allergic to xyzal [levocetirizine dihydrochloride], augmentin [amoxicillin-pot clavulanate], and tribenzor [olmesartan-amlodipine-hctz].  MEDICATIONS:  Current Outpatient Medications  Medication Sig Dispense Refill  . cilostazol (PLETAL) 100 MG tablet Take 1 tablet (100 mg total) by mouth 2 (two) times daily. 180 tablet 3  . diphenoxylate-atropine (LOMOTIL) 2.5-0.025 MG tablet Take 1 tablet by mouth 4 (four) times daily as needed for diarrhea or loose stools. 75 tablet 1  . Ensure (ENSURE) Take 237 mLs by mouth 2 (two) times daily.    . famotidine (PEPCID) 40 MG tablet TAKE 1 TABLET BY MOUTH EVERY DAY 90 tablet 1  . fluticasone (FLONASE) 50 MCG/ACT nasal spray SPRAY 2 SPRAYS INTO EACH NOSTRIL EVERY DAY 48 mL 2  . hydrALAZINE (APRESOLINE) 50 MG tablet TAKE 1.5 TABLETS (75 MG TOTAL) BY MOUTH 3 (THREE) TIMES DAILY. 405 tablet 1  . isosorbide mononitrate (IMDUR) 60 MG 24 hr tablet Take 1 tablet (60 mg total) by mouth daily. 90 tablet 3  . lactose free nutrition (BOOST) LIQD Take 237 mLs by mouth 2 (two) times daily between meals.    [name] levothyroxine (SYNTHROID) 50 MCG tablet TAKE 1 TABLET BY MOUTH DAILY BEFORE BREAKFAST  90 tablet 3  . loperamide (IMODIUM A-D) 2 MG tablet Take 2 mg by mouth 4 (four) times daily as needed for diarrhea or loose stools.    [name] LORazepam (ATIVAN) 1 MG tablet Take 1 tablet (1 mg total) by mouth 2 (two) times daily. 60 tablet 2  . metoprolol tartrate (LOPRESSOR) 50 MG tablet Take 1 tablet (50 mg total) by mouth 2 (two) times daily. 180 tablet 3  . mirtazapine (REMERON) 30 MG tablet Take 1 tablet (30 mg total) by mouth at bedtime. 90 tablet 3  . Olmesartan-amLODIPine-HCTZ 20-5-12.5 MG TABS Take 1 tablet by mouth daily. 90 tablet 3  . ondansetron (ZOFRAN ODT) 8 MG disintegrating tablet Take 1 tablet (8 mg total) by mouth every 8 (eight) hours as needed for nausea or vomiting. 30 tablet 0  . promethazine (PHENERGAN) 25 MG tablet Take 1 tablet (25 mg total) by mouth every 8 (eight) hours as needed for nausea or vomiting. 60 tablet 0  . rosuvastatin (CRESTOR) 20 MG tablet Take 1 tablet (20 mg total) by mouth daily. 90 tablet 3  . thiamine (VITAMIN B-1) 100 MG tablet Take 1 tablet (100 mg total) by mouth daily. 90 tablet 1   No current facility-administered medications for this visit.     PHYSICAL EXAMINATION:  ECOG PERFORMANCE STATUS: 0 - Asymptomatic  There were no vitals filed for this visit. There were no vitals filed for this visit.  GENERAL:alert, no distress and comfortable SKIN: skin color, texture, turgor are normal, no rashes or significant lesions EYES: Pallor noted. OROPHARYNX: She has tracheostomy because of her history of laryngeal cancer. NECK: supple, thyroid normal size, non-tender, without nodularity LYMPH:  no palpable lymphadenopathy in the cervical, axillary or inguinal, tightness from her previous surgery LUNGS: clear to auscultation and percussion with normal breathing effort HEART: regular rate & rhythm and no murmurs and no lower extremity edema ABDOMEN:abdomen soft, non-tender and normal bowel sounds Musculoskeletal:no cyanosis of digits and no clubbing.   Bilateral lower extremities appear normal, no overt swelling or post thrombotic syndrome. PSYCH: alert & oriented x 3 with fluent speech NEURO: no focal motor/sensory deficits  LABORATORY DATA:  I have reviewed the data as listed Lab Results  Component Value Date   WBC 10.2 04/07/2021   HGB 11.4 (L) 04/07/2021  HCT 33.4 (L) 04/07/2021   MCV 86.6 04/07/2021   PLT 234.0 04/07/2021     Chemistry      Component Value Date/Time   NA 137 04/07/2021 1033   NA 139 03/10/2021 0000   K 4.4 04/07/2021 1033   CL 105 04/07/2021 1033   CO2 18 (L) 04/07/2021 1033   BUN 67 (H) 04/07/2021 1033   BUN 42 (A) 03/10/2021 0000   CREATININE 2.40 (H) 04/07/2021 1033   CREATININE 2.70 (H) 01/07/2021 1037   GLU 100 03/10/2021 0000      Component Value Date/Time   CALCIUM 9.6 04/07/2021 1033   ALKPHOS 56 04/07/2021 1033   AST 18 04/07/2021 1033   AST 15 01/07/2021 1037   ALT 18 04/07/2021 1033   ALT 21 01/07/2021 1037   BILITOT 0.4 04/07/2021 1033   BILITOT 0.3 01/07/2021 1037      RADIOGRAPHIC STUDIES: I have personally reviewed the radiological images as listed and agreed with the findings in the report. No results found.  All questions were answered. The patient knows to call the clinic with any problems, questions or concerns.     Benay Pike, MD 05/13/2021 8:43 AM

## 2021-05-13 NOTE — Telephone Encounter (Signed)
Attempted to call numbers on file to inquire about missed appt; no answer. Scheduling message sent to reschedule appt.

## 2021-05-14 ENCOUNTER — Telehealth: Payer: Self-pay | Admitting: Hematology and Oncology

## 2021-05-14 ENCOUNTER — Other Ambulatory Visit: Payer: Self-pay | Admitting: Internal Medicine

## 2021-05-14 DIAGNOSIS — F411 Generalized anxiety disorder: Secondary | ICD-10-CM

## 2021-05-14 NOTE — Telephone Encounter (Signed)
R/s appt per 5/31 sch msg. Called pt using the interpreter line. No answer. Left msg with appt date and time.

## 2021-05-15 ENCOUNTER — Telehealth: Payer: Self-pay | Admitting: Hematology and Oncology

## 2021-05-15 NOTE — Telephone Encounter (Signed)
Pt called in to r/s appt per 6/1 sch msg. Pt requested appt in July. R/s appt per pt's request.

## 2021-05-27 ENCOUNTER — Ambulatory Visit: Payer: Medicare Other | Admitting: Hematology and Oncology

## 2021-05-29 DIAGNOSIS — J9503 Malfunction of tracheostomy stoma: Secondary | ICD-10-CM | POA: Diagnosis not present

## 2021-05-29 DIAGNOSIS — R1319 Other dysphagia: Secondary | ICD-10-CM | POA: Diagnosis not present

## 2021-05-29 DIAGNOSIS — Z963 Presence of artificial larynx: Secondary | ICD-10-CM | POA: Diagnosis not present

## 2021-05-29 DIAGNOSIS — C329 Malignant neoplasm of larynx, unspecified: Secondary | ICD-10-CM | POA: Diagnosis not present

## 2021-05-29 DIAGNOSIS — R1310 Dysphagia, unspecified: Secondary | ICD-10-CM | POA: Diagnosis not present

## 2021-05-29 DIAGNOSIS — Z8521 Personal history of malignant neoplasm of larynx: Secondary | ICD-10-CM | POA: Diagnosis not present

## 2021-05-29 DIAGNOSIS — Z87891 Personal history of nicotine dependence: Secondary | ICD-10-CM | POA: Diagnosis not present

## 2021-05-29 DIAGNOSIS — R491 Aphonia: Secondary | ICD-10-CM | POA: Diagnosis not present

## 2021-05-29 DIAGNOSIS — K222 Esophageal obstruction: Secondary | ICD-10-CM | POA: Diagnosis not present

## 2021-05-29 DIAGNOSIS — J392 Other diseases of pharynx: Secondary | ICD-10-CM | POA: Diagnosis not present

## 2021-06-10 ENCOUNTER — Ambulatory Visit: Payer: Medicare Other | Admitting: Internal Medicine

## 2021-06-12 DIAGNOSIS — R490 Dysphonia: Secondary | ICD-10-CM | POA: Diagnosis not present

## 2021-06-26 ENCOUNTER — Inpatient Hospital Stay: Payer: Medicare Other | Admitting: Hematology and Oncology

## 2021-06-26 ENCOUNTER — Telehealth: Payer: Self-pay | Admitting: Hematology and Oncology

## 2021-06-26 NOTE — Telephone Encounter (Signed)
Scheduled appointment per 07/14 sch msg. Patient is aware. 

## 2021-06-28 ENCOUNTER — Encounter: Payer: Self-pay | Admitting: Cardiovascular Disease

## 2021-06-28 NOTE — Progress Notes (Signed)
This encounter was created in error - please disregard.

## 2021-06-30 ENCOUNTER — Encounter: Payer: Medicare Other | Admitting: Cardiovascular Disease

## 2021-07-01 ENCOUNTER — Inpatient Hospital Stay: Payer: Medicare Other | Attending: Hematology and Oncology | Admitting: Hematology and Oncology

## 2021-07-01 ENCOUNTER — Telehealth: Payer: Self-pay

## 2021-07-01 NOTE — Progress Notes (Deleted)
[name] NOTE  Patient Care Team: Hoyt Koch, MD as PCP - General (Internal Medicine) Nahser, Wonda Cheng, MD as PCP - Cardiology (Cardiology)  CHIEF COMPLAINTS/PURPOSE OF CONSULTATION:  Right lower extremity DVT  ASSESSMENT & PLAN:  No problem-specific Assessment & Plan notes found for this encounter.  No orders of the defined types were placed in this encounter.  1.  Right lower extremity soleal vein DVT, first episode, likely unprovoked. Given first episode of unprovoked DVT no prior history, we recommended 3 to 6 months of anticoagulation and monitor.  We also recommended age appropriate cancer screening, no role for hypercoagulable work up given her age at diagnosis for first DVT. I got a message from her doctor that given her kidney function, if her Eliquis dose can be changed to 2.5 mg PO BID I have discussed  that Eliquis hasn't been studied very well in this population with low GFR but retrospective studies say it may be safer to offer in this population. Anyways, we are not required to reduce eliquis dosing for DVT management but given her age, kidney function, risk of falling and bleeding and since its a distal DVT, I think she continue 2.5 mg PO BID for a total of 3 months and discontinue. We have discussed this again today. She is agreeable to continuing low dose eliquis and FU If she needs anticoagulation for any other reason, she may have to discuss details with respective specialities. [name] will discuss with the cardiologist as well.  2.  Anemia, normocytic, normochromic, likely related to her worsening kidney function. B 12 levels normal. No evidence of iron deficiency. This is likely from CKD. Recommended to consider EPO if nephrology in agreement, discussed about small possibility of DVT with epo when she discontinues anticoagulation especially if Hb is more than 11 gms.  HISTORY OF PRESENTING ILLNESS:   [name] 75 y.o.  female is here because of DVT of right soleal vein.  This is a very pleasant 75 year old Spanish-speaking female patient who arrived with her [name] for an initial visit to discuss about recommendations for her recently diagnosed right lower extremity distal DVT.  [name] started noticing an explosive pain in her right lower leg and went to see her PCP who ordered an ultrasound and this showed DVT of right soleal vein. No clear provoking factors for the DVT  She has had history of laryngeal cancer had total laryngectomy, bilateral neck dissection, adjuvant radiation and now has a trach, follows up regularly with ENT at [facility].  She has had multiple surgeries in the past, never had any perioperative or postoperative DVT/PE.  She has 3 live pregnancies and lost 1 child in her second trimester because of premature rupture of membranes.    Interim History  She is here for FU with her [name]. She is doing really well. Pain in the leg has disapperared. No chest pain or SOB. No bleeding complaints.  REVIEW OF SYSTEMS:   Constitutional: Denies fevers, chills or abnormal night sweats Eyes: Denies blurriness of vision, double vision or watery eyes Ears, nose, mouth, throat, and face: Denies mucositis or sore throat Respiratory: Some cough and discharge around the trach site according to the patient Cardiovascular: Denies palpitation, chest discomfort or lower extremity swelling Gastrointestinal:  Denies nausea, heartburn or change in bowel habits Skin: Denies abnormal skin rashes Lymphatics: Denies new lymphadenopathy or easy bruising Neurological:Denies numbness, tingling or new weaknesses Behavioral/Psych: Mood is stable, no new changes  All  other systems were reviewed with the patient and are negative.  MEDICAL HISTORY:  Past Medical History:  Diagnosis Date   Anemia    Anxiety    takes Ativan prn   Blood transfusion without reported diagnosis 09/15/12   2 units Prbc's   Broken  ribs    Chronic back pain    CKD (chronic kidney disease), stage IV (HCC)    Constipation    related to pain meds   COPD (chronic obstructive pulmonary disease) ([place]) 08/10/2012   denies   Depression    Gastrostomy in place [facility])    removed   GERD (gastroesophageal reflux disease)    takes Zantac daily   Headache(784.0)    Hiatal hernia 08/10/2012   History of radiation therapy 10/17/12-11/25/12   supraglottic larynx,high risk neck tumor bed 5880 cGy/28 sessions, high risk lymph node tumor bed 5600 cGy/20 sessions, mod risk lymph node tumor bed 5040 cGy/20 sessions   Hypercholesteremia    takes Pravastatin daily   Hypertension    takes Tribenzor and Atenolol daily   Insomnia    takes Amitriptyline daily   Nausea    takes Zofran prn   PAD (peripheral artery disease) ([place])    noninvasive imaging in 2016   Pneumonia    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 no residual   Uterine cancer ([place])     SURGICAL HISTORY: Past Surgical History:  Procedure Laterality Date   ABDOMINAL SURGERY     r/t uterine carcinoma   APPENDECTOMY     DIRECT LARYNGOSCOPY N/A 05/22/2014   Procedure: DIRECT LARYNGOSCOPY WITH ESOPHAGEAL DILATION;  Surgeon: Jerrell Belfast, MD;  Location: [facility] OR;  Service: ENT;  Laterality: N/A;   ESOPHAGOSCOPY WITH DILITATION N/A 05/29/2015   Procedure: ESOPHAGOSCOPY WITH ESOPHAGEAL DILITATION;  Surgeon: Jerrell Belfast, MD;  Location: [facility] OR;  Service: ENT;  Laterality: N/A;   Gastrostomy Tube removed   2013   GASTROSTOMY W/ FEEDING TUBE  12   HERNIA REPAIR     child   LARYNGETOMY  08/31/2012   Procedure: LARYNGECTOMY;  Surgeon: Jerrell Belfast, MD;  Location: [place];  Service: ENT;  Laterality: N/A;   LARYNGOSCOPY  08/10/2012   Procedure: LARYNGOSCOPY;  Surgeon: Jerrell Belfast, MD;  Location: [facility];  Service: ENT;  Laterality: N/A;  with biopsy   RADICAL NECK DISSECTION  08/31/2012    Procedure: RADICAL NECK DISSECTION;  Surgeon: Jerrell Belfast, MD;  Location: [place];  Service: ENT;  Laterality: Bilateral;   TRACHEAL ESOPHOGEAL PUNCTURE WITH REPAIR STOMA N/A 09/08/2013   Procedure: TRACHEAL ESOPHOGEAL PUNCTURE WITH PLACEMENT OF  [place] ;  Surgeon: Jerrell Belfast, MD;  Location: [place];  Service: ENT;  Laterality: N/A;   TRACHEOSTOMY TUBE PLACEMENT  08/10/2012   Procedure: TRACHEOSTOMY;  Surgeon: Jerrell Belfast, MD;  Location: [facility];  Service: ENT;  Laterality: N/A;    SOCIAL HISTORY: Social History   Socioeconomic History   Marital status: Widowed    Spouse name: Not on file   Number of children: 4   Years of education: Not on file   Highest education level: Not on file  Occupational History    Comment: retired Regulatory affairs officer  Tobacco Use   Smoking status: Former    Packs/day: 0.25    Years: 50.00    Pack years: 12.50    Types: Cigarettes    Quit date: 05/27/2013    Years  since quitting: 8.1   Smokeless tobacco: Never  Vaping Use   Vaping Use: Never used  Substance and Sexual Activity   Alcohol use: Yes    Comment: 1-2 shots per week   Drug use: No   Sexual activity: Not Currently  Other Topics Concern   Not on file  Social History Narrative   Not on file   Social Determinants of Health   Financial Resource Strain: Not on file  Food Insecurity: No Food Insecurity   Worried About Running Out of Food in the Last Year: Never true   Ran Out of Food in the Last Year: Never true  Transportation Needs: No Transportation Needs   Lack of Transportation (Medical): No   Lack of Transportation (Non-Medical): No  Physical Activity: Not on file  Stress: Not on file  Social Connections: Socially Isolated   Frequency of Communication with Friends and Family: Once a week   Frequency of Social Gatherings with Friends and Family: Once a week   Attends Religious Services: More than 4 times per year   Active Member of [employer] or Organizations: No    Attends Archivist Meetings: Never   Marital Status: Widowed  Human resources officer Violence: Not on file    FAMILY HISTORY: Family History  Problem Relation Age of Onset   Throat cancer Father    Brain cancer Brother    CAD Neg Hx     ALLERGIES:  is allergic to xyzal [levocetirizine dihydrochloride], augmentin [amoxicillin-pot clavulanate], and tribenzor [olmesartan-amlodipine-hctz].  MEDICATIONS:  Current Outpatient Medications  Medication Sig Dispense Refill   cilostazol (PLETAL) 100 MG tablet Take 1 tablet (100 mg total) by mouth 2 (two) times daily. 180 tablet 3   diphenoxylate-atropine (LOMOTIL) 2.5-0.025 MG tablet Take 1 tablet by mouth 4 (four) times daily as needed for diarrhea or loose stools. 75 tablet 1   Ensure (ENSURE) Take 237 mLs by mouth 2 (two) times daily.     famotidine (PEPCID) 40 MG tablet TAKE 1 TABLET BY MOUTH EVERY DAY 90 tablet 1   fluticasone (FLONASE) 50 MCG/ACT nasal spray SPRAY 2 SPRAYS INTO EACH NOSTRIL EVERY DAY 48 mL 2   hydrALAZINE (APRESOLINE) 50 MG tablet TAKE 1.5 TABLETS (75 MG TOTAL) BY MOUTH 3 (THREE) TIMES DAILY. 405 tablet 1   isosorbide mononitrate (IMDUR) 60 MG 24 hr tablet Take 1 tablet (60 mg total) by mouth daily. 90 tablet 3   lactose free nutrition (BOOST) LIQD Take 237 mLs by mouth 2 (two) times daily between meals.     levothyroxine (SYNTHROID) 50 MCG tablet TAKE 1 TABLET BY MOUTH DAILY BEFORE BREAKFAST 90 tablet 3   loperamide (IMODIUM A-D) 2 MG tablet Take 2 mg by mouth 4 (four) times daily as needed for diarrhea or loose stools.     LORazepam (ATIVAN) 1 MG tablet TAKE 1 TABLET BY MOUTH TWICE A DAY 60 tablet 5   metoprolol tartrate (LOPRESSOR) 50 MG tablet Take 1 tablet (50 mg total) by mouth 2 (two) times daily. 180 tablet 3   mirtazapine (REMERON) 30 MG tablet Take 1 tablet (30 mg total) by mouth at bedtime. 90 tablet 3   Olmesartan-amLODIPine-HCTZ 20-5-12.5 MG TABS Take 1 tablet by mouth daily. 90 tablet 3   ondansetron  (ZOFRAN ODT) 8 MG disintegrating tablet Take 1 tablet (8 mg total) by mouth every 8 (eight) hours as needed for nausea or vomiting. 30 tablet 0   promethazine (PHENERGAN) 25 MG tablet TAKE 1 TABLET BY MOUTH EVERY 8 HOURS  AS NEEDED FOR NAUSEA OR VOMITING. 60 tablet 3   rosuvastatin (CRESTOR) 20 MG tablet Take 1 tablet (20 mg total) by mouth daily. 90 tablet 3   thiamine (VITAMIN B-1) 100 MG tablet Take 1 tablet (100 mg total) by mouth daily. 90 tablet 1   No current facility-administered medications for this visit.     PHYSICAL EXAMINATION: ECOG PERFORMANCE STATUS: 0 - Asymptomatic  There were no vitals filed for this visit. There were no vitals filed for this visit.  GENERAL:alert, no distress and comfortable SKIN: skin color, texture, turgor are normal, no rashes or significant lesions EYES: Pallor noted. OROPHARYNX: She has tracheostomy because of her history of laryngeal cancer. NECK: supple, thyroid normal size, non-tender, without nodularity LYMPH:  no palpable lymphadenopathy in the cervical, axillary or inguinal, tightness from her previous surgery LUNGS: clear to auscultation and percussion with normal breathing effort HEART: regular rate & rhythm and no murmurs and no lower extremity edema ABDOMEN:abdomen soft, non-tender and normal bowel sounds Musculoskeletal:no cyanosis of digits and no clubbing.  Bilateral lower extremities appear normal, no overt swelling or post thrombotic syndrome. PSYCH: alert & oriented x 3 with fluent speech NEURO: no focal motor/sensory deficits  LABORATORY DATA:  I have reviewed the data as listed Lab Results  Component Value Date   WBC 10.2 04/07/2021   HGB 11.4 (L) 04/07/2021   HCT 33.4 (L) 04/07/2021   MCV 86.6 04/07/2021   PLT 234.0 04/07/2021     Chemistry      Component Value Date/Time   NA 137 04/07/2021 1033   NA 139 03/10/2021 0000   K 4.4 04/07/2021 1033   CL 105 04/07/2021 1033   CO2 18 (L) 04/07/2021 1033   BUN 67 (H)  04/07/2021 1033   BUN 42 (A) 03/10/2021 0000   CREATININE 2.40 (H) 04/07/2021 1033   CREATININE 2.70 (H) 01/07/2021 1037   GLU 100 03/10/2021 0000      Component Value Date/Time   CALCIUM 9.6 04/07/2021 1033   ALKPHOS 56 04/07/2021 1033   AST 18 04/07/2021 1033   AST 15 01/07/2021 1037   ALT 18 04/07/2021 1033   ALT 21 01/07/2021 1037   BILITOT 0.4 04/07/2021 1033   BILITOT 0.3 01/07/2021 1037       RADIOGRAPHIC STUDIES: I have personally reviewed the radiological images as listed and agreed with the findings in the report. No results found.   All questions were answered. The patient knows to call the clinic with any problems, questions or concerns. I spent 30 minutes in the care of this patient including H and P, review of records, counseling and coordination of care.     Benay Pike, MD 07/01/2021 1:06 PM

## 2021-07-01 NOTE — Telephone Encounter (Signed)
Attempted to call patient regarding missed appointment today; no answer.

## 2021-07-04 ENCOUNTER — Encounter (HOSPITAL_COMMUNITY): Payer: Self-pay

## 2021-07-04 ENCOUNTER — Emergency Department (HOSPITAL_COMMUNITY): Payer: No Typology Code available for payment source

## 2021-07-04 ENCOUNTER — Emergency Department (HOSPITAL_COMMUNITY)
Admission: EM | Admit: 2021-07-04 | Discharge: 2021-07-04 | Disposition: A | Discharge status: Home / Self Care | Payer: No Typology Code available for payment source | Attending: Emergency Medicine | Admitting: Emergency Medicine

## 2021-07-04 ENCOUNTER — Other Ambulatory Visit: Payer: Self-pay

## 2021-07-04 DIAGNOSIS — S161XXA Strain of muscle, fascia and tendon at neck level, initial encounter: Secondary | ICD-10-CM

## 2021-07-04 DIAGNOSIS — I129 Hypertensive chronic kidney disease with stage 1 through stage 4 chronic kidney disease, or unspecified chronic kidney disease: Secondary | ICD-10-CM | POA: Insufficient documentation

## 2021-07-04 DIAGNOSIS — Y92410 Unspecified street and highway as the place of occurrence of the external cause: Secondary | ICD-10-CM | POA: Insufficient documentation

## 2021-07-04 DIAGNOSIS — J449 Chronic obstructive pulmonary disease, unspecified: Secondary | ICD-10-CM | POA: Diagnosis not present

## 2021-07-04 DIAGNOSIS — S3991XA Unspecified injury of abdomen, initial encounter: Secondary | ICD-10-CM | POA: Diagnosis not present

## 2021-07-04 DIAGNOSIS — Z87891 Personal history of nicotine dependence: Secondary | ICD-10-CM | POA: Diagnosis not present

## 2021-07-04 DIAGNOSIS — R519 Headache, unspecified: Secondary | ICD-10-CM | POA: Diagnosis not present

## 2021-07-04 DIAGNOSIS — M79605 Pain in left leg: Secondary | ICD-10-CM | POA: Diagnosis not present

## 2021-07-04 DIAGNOSIS — Z79899 Other long term (current) drug therapy: Secondary | ICD-10-CM | POA: Diagnosis not present

## 2021-07-04 DIAGNOSIS — N184 Chronic kidney disease, stage 4 (severe): Secondary | ICD-10-CM | POA: Insufficient documentation

## 2021-07-04 DIAGNOSIS — M545 Low back pain, unspecified: Secondary | ICD-10-CM | POA: Diagnosis not present

## 2021-07-04 DIAGNOSIS — S4990XA Unspecified injury of shoulder and upper arm, unspecified arm, initial encounter: Secondary | ICD-10-CM | POA: Diagnosis not present

## 2021-07-04 DIAGNOSIS — M79603 Pain in arm, unspecified: Secondary | ICD-10-CM | POA: Diagnosis not present

## 2021-07-04 DIAGNOSIS — S39012A Strain of muscle, fascia and tendon of lower back, initial encounter: Secondary | ICD-10-CM | POA: Insufficient documentation

## 2021-07-04 DIAGNOSIS — S0592XA Unspecified injury of left eye and orbit, initial encounter: Secondary | ICD-10-CM | POA: Diagnosis not present

## 2021-07-04 DIAGNOSIS — S3992XA Unspecified injury of lower back, initial encounter: Secondary | ICD-10-CM | POA: Diagnosis present

## 2021-07-04 DIAGNOSIS — R0789 Other chest pain: Secondary | ICD-10-CM | POA: Insufficient documentation

## 2021-07-04 DIAGNOSIS — Z7952 Long term (current) use of systemic steroids: Secondary | ICD-10-CM | POA: Insufficient documentation

## 2021-07-04 DIAGNOSIS — I700 Atherosclerosis of aorta: Secondary | ICD-10-CM | POA: Diagnosis not present

## 2021-07-04 DIAGNOSIS — R109 Unspecified abdominal pain: Secondary | ICD-10-CM | POA: Diagnosis not present

## 2021-07-04 DIAGNOSIS — Z041 Encounter for examination and observation following transport accident: Secondary | ICD-10-CM | POA: Diagnosis not present

## 2021-07-04 DIAGNOSIS — S4991XA Unspecified injury of right shoulder and upper arm, initial encounter: Secondary | ICD-10-CM | POA: Diagnosis not present

## 2021-07-04 DIAGNOSIS — M542 Cervicalgia: Secondary | ICD-10-CM | POA: Diagnosis not present

## 2021-07-04 LAB — CBC WITH DIFFERENTIAL/PLATELET
Abs Immature Granulocytes: 0.08 10*3/uL — ABNORMAL HIGH (ref 0.00–0.07)
Basophils Absolute: 0 10*3/uL (ref 0.0–0.1)
Basophils Relative: 0 %
Eosinophils Absolute: 0.2 10*3/uL (ref 0.0–0.5)
Eosinophils Relative: 2 %
HCT: 27.7 % — ABNORMAL LOW (ref 36.0–46.0)
Hemoglobin: 8.9 g/dL — ABNORMAL LOW (ref 12.0–15.0)
Immature Granulocytes: 1 %
Lymphocytes Relative: 19 %
Lymphs Abs: 1.4 10*3/uL (ref 0.7–4.0)
MCH: 29.6 pg (ref 26.0–34.0)
MCHC: 32.1 g/dL (ref 30.0–36.0)
MCV: 92 fL (ref 80.0–100.0)
Monocytes Absolute: 0.7 10*3/uL (ref 0.1–1.0)
Monocytes Relative: 9 %
Neutro Abs: 4.9 10*3/uL (ref 1.7–7.7)
Neutrophils Relative %: 69 %
Platelets: 162 10*3/uL (ref 150–400)
RBC: 3.01 MIL/uL — ABNORMAL LOW (ref 3.87–5.11)
RDW: 13.9 % (ref 11.5–15.5)
WBC: 7.2 10*3/uL (ref 4.0–10.5)
nRBC: 0 % (ref 0.0–0.2)

## 2021-07-04 LAB — COMPREHENSIVE METABOLIC PANEL
ALT: 14 U/L (ref 0–44)
AST: 19 U/L (ref 15–41)
Albumin: 3.9 g/dL (ref 3.5–5.0)
Alkaline Phosphatase: 63 U/L (ref 38–126)
Anion gap: 7 (ref 5–15)
BUN: 38 mg/dL — ABNORMAL HIGH (ref 8–23)
CO2: 20 mmol/L — ABNORMAL LOW (ref 22–32)
Calcium: 9.3 mg/dL (ref 8.9–10.3)
Chloride: 114 mmol/L — ABNORMAL HIGH (ref 98–111)
Creatinine, Ser: 2.29 mg/dL — ABNORMAL HIGH (ref 0.44–1.00)
GFR, Estimated: 22 mL/min — ABNORMAL LOW (ref >60)
Glucose, Bld: 120 mg/dL — ABNORMAL HIGH (ref 70–99)
Potassium: 3.8 mmol/L (ref 3.5–5.1)
Sodium: 141 mmol/L (ref 135–145)
Total Bilirubin: 0.5 mg/dL (ref 0.3–1.2)
Total Protein: 6.9 g/dL (ref 6.5–8.1)

## 2021-07-04 LAB — LIPASE, BLOOD: Lipase: 36 U/L (ref 11–51)

## 2021-07-04 MED ORDER — CYCLOBENZAPRINE HCL 10 MG PO TABS: 10.0000 mg | ORAL_TABLET | Freq: Two times a day (BID) | ORAL | 0 refills | Status: DC | PRN

## 2021-07-04 MED ORDER — FENTANYL CITRATE (PF) 100 MCG/2ML IJ SOLN
100.0000 ug | Freq: Once | INTRAMUSCULAR | Status: AC
  Administered 2021-07-04: 100 ug via INTRAVENOUS
  Filled 2021-07-04: qty 2

## 2021-07-04 MED ORDER — HYDRALAZINE HCL 20 MG/ML IJ SOLN
10.0000 mg | Freq: Once | INTRAMUSCULAR | Status: AC
  Administered 2021-07-04: 10 mg via INTRAVENOUS
  Filled 2021-07-04: qty 1
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
## 2021-07-04 NOTE — ED Notes (Signed)
Patient transported to X-ray 
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
## 2021-07-11 ENCOUNTER — Encounter: Payer: Self-pay | Admitting: Internal Medicine

## 2021-07-11 ENCOUNTER — Other Ambulatory Visit: Payer: Self-pay

## 2021-07-11 ENCOUNTER — Ambulatory Visit (INDEPENDENT_AMBULATORY_CARE_PROVIDER_SITE_OTHER): Payer: Medicare Other | Admitting: Internal Medicine

## 2021-07-11 VITALS — BP 128/78 | HR 67 | Temp 97.6°F | Resp 18 | Ht 64.0 in | Wt 120.0 lb

## 2021-07-11 DIAGNOSIS — M5441 Lumbago with sciatica, right side: Secondary | ICD-10-CM

## 2021-07-11 DIAGNOSIS — C320 Malignant neoplasm of glottis: Secondary | ICD-10-CM | POA: Diagnosis not present

## 2021-07-11 DIAGNOSIS — I249 Acute ischemic heart disease, unspecified: Secondary | ICD-10-CM

## 2021-07-11 DIAGNOSIS — M542 Cervicalgia: Secondary | ICD-10-CM | POA: Insufficient documentation

## 2021-07-11 DIAGNOSIS — F411 Generalized anxiety disorder: Secondary | ICD-10-CM

## 2021-07-11 DIAGNOSIS — M5442 Lumbago with sciatica, left side: Secondary | ICD-10-CM

## 2021-07-11 MED ORDER — DULOXETINE HCL 20 MG PO CPEP: 20.0000 mg | ORAL_CAPSULE | Freq: Every day | ORAL | 3 refills | Status: DC

## 2021-07-11 MED ORDER — BACLOFEN 5 MG PO TABS: 5.0000 mg | ORAL_TABLET | Freq: Two times a day (BID) | ORAL | 0 refills | Status: DC | PRN

## 2021-07-11 MED ORDER — METHYLPREDNISOLONE ACETATE 40 MG/ML IJ SUSP
40.0000 mg | Freq: Once | INTRAMUSCULAR | Status: AC
  Administered 2021-07-11: 40 mg via INTRAMUSCULAR

## 2021-07-11 NOTE — Assessment & Plan Note (Signed)
Has had recent procedure 05/29/21 and is awaiting follow up with ENT as this has not helped with her symptoms of liquid coming out of her nose when talking.

## 2021-07-11 NOTE — Patient Instructions (Addendum)
We have given you the shot for the pain today.  We have sent in duloxetine (cymbalta) for the depression which may help with the pain some also.  We have sent in baclofen to use if needed for pain 1/2 to 1 pill up to twice a day.
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
## 2021-07-11 NOTE — Assessment & Plan Note (Signed)
Rx baclofen 5 mg 1/2 to 1 pill BID prn pain. Given depo-medrol 40 mg IM. Can take otc tylenol. Cannot take NSAIDs due to CKD stage 4.

## 2021-07-11 NOTE — Assessment & Plan Note (Signed)
Given that remeron was too sedating will try duloxetine 20 mg daily to see if this helps with chronic pain as well as depression/anxiety.

## 2021-07-18 ENCOUNTER — Telehealth: Payer: Self-pay | Admitting: Internal Medicine

## 2021-07-18 ENCOUNTER — Telehealth (INDEPENDENT_AMBULATORY_CARE_PROVIDER_SITE_OTHER): Payer: Medicare Other | Admitting: Internal Medicine

## 2021-07-18 ENCOUNTER — Encounter: Payer: Self-pay | Admitting: Internal Medicine

## 2021-07-18 VITALS — Ht 64.0 in | Wt 120.0 lb

## 2021-07-18 DIAGNOSIS — I249 Acute ischemic heart disease, unspecified: Secondary | ICD-10-CM

## 2021-07-18 DIAGNOSIS — U071 COVID-19: Secondary | ICD-10-CM

## 2021-07-18 MED ORDER — MOLNUPIRAVIR EUA 200MG CAPSULE: 4.0000 | ORAL_CAPSULE | Freq: Two times a day (BID) | ORAL | 0 refills | Status: AC

## 2021-07-18 NOTE — Progress Notes (Signed)
Subjective:    Patient ID: [name], female    DOB: March 31, 1946, 75 y.o.   MRN: [id number]  DOS:  07/18/2021 Type of visit - description: Virtual Visit via Video Note  I connected with the above patient  by a video enabled telemedicine application and verified that I am speaking with the correct person using two identifiers.   THIS ENCOUNTER IS A VIRTUAL VISIT DUE TO COVID-19 - PATIENT WAS NOT SEEN IN THE OFFICE. PATIENT HAS CONSENTED TO VIRTUAL VISIT / TELEMEDICINE VISIT   Location of patient: home  Location of provider: office  Persons participating in the virtual visit: patient, provider   I discussed the limitations of evaluation and management by telemedicine and the availability of in person appointments. The patient expressed understanding and agreed to proceed.  Acute The visit is facilitated by her daughter in law who managed the video. 3 days ago the patient developed chills, generalized aches, some shortness of breath. They also noted cough. The patient had a tracheal stoma and they noted increase amount of clear mucus coming out.  The same day she tested positive for COVID.  Other family members are also affected.  He has some nausea and vomiting but no diarrhea.    Review of Systems See above   Past Medical History:  Diagnosis Date   Anemia    Anxiety    takes Ativan prn   Blood transfusion without reported diagnosis 09/15/12   2 units Prbc's   Broken ribs    Chronic back pain    CKD (chronic kidney disease), stage IV (HCC)    Constipation    related to pain meds   COPD (chronic obstructive pulmonary disease) ([place]) 08/10/2012   denies   Depression    Gastrostomy in place [facility])    removed   GERD (gastroesophageal reflux disease)    takes Zantac daily   Headache(784.0)    Hiatal hernia 08/10/2012   History of radiation therapy 10/17/12-11/25/12   supraglottic larynx,high risk neck tumor bed 5880 cGy/28 sessions, high risk lymph node tumor bed 5600  cGy/20 sessions, mod risk lymph node tumor bed 5040 cGy/20 sessions   Hypercholesteremia    takes Pravastatin daily   Hypertension    takes Tribenzor and Atenolol daily   Insomnia    takes Amitriptyline daily   Nausea    takes Zofran prn   PAD (peripheral artery disease) ([place])    noninvasive imaging in 2016   Pneumonia    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 08/08/2012   Shortness of breath dyspnea    Stroke [facility]) 2011   denies. no residual   Uterine cancer ([place])     Past Surgical History:  Procedure Laterality Date   ABDOMINAL SURGERY     r/t uterine carcinoma   APPENDECTOMY     DIRECT LARYNGOSCOPY N/A 05/22/2014   Procedure: DIRECT LARYNGOSCOPY WITH ESOPHAGEAL DILATION;  Surgeon: Jerrell Belfast, MD;  Location: [place];  Service: ENT;  Laterality: N/A;   ESOPHAGOSCOPY WITH DILITATION N/A 05/29/2015   Procedure: ESOPHAGOSCOPY WITH ESOPHAGEAL DILITATION;  Surgeon: Jerrell Belfast, MD;  Location: [place];  Service: ENT;  Laterality: N/A;   Gastrostomy Tube removed   2013   GASTROSTOMY W/ FEEDING TUBE  13   HERNIA REPAIR     child   LARYNGETOMY  08/31/2012   Procedure: LARYNGECTOMY;  Surgeon: Jerrell Belfast, MD;  Location: [place];  Service: ENT;  Laterality: N/A;   LARYNGOSCOPY  08/10/2012   Procedure: LARYNGOSCOPY;  Surgeon: Jerrell Belfast, MD;  Location: [facility];  Service: ENT;  Laterality: N/A;  with biopsy   RADICAL NECK DISSECTION  08/31/2012   Procedure: RADICAL NECK DISSECTION;  Surgeon: Jerrell Belfast, MD;  Location: [place];  Service: ENT;  Laterality: Bilateral;   TRACHEAL ESOPHOGEAL PUNCTURE WITH REPAIR STOMA N/A 09/08/2013   Procedure: TRACHEAL ESOPHOGEAL PUNCTURE WITH PLACEMENT OF  [place] ;  Surgeon: Jerrell Belfast, MD;  Location: ;  Service: ENT;  Laterality: N/A;   TRACHEOSTOMY TUBE PLACEMENT  08/10/2012   Procedure: TRACHEOSTOMY;  Surgeon: Jerrell Belfast, MD;  Location: [facility];  Service: ENT;  Laterality: N/A;    Allergies as of 07/18/2021        Reactions   Xyzal [levocetirizine Dihydrochloride] Itching   Augmentin [amoxicillin-pot Clavulanate] Diarrhea, Nausea And Vomiting   Tribenzor [olmesartan-amlodipine-hctz] Other (See Comments)   "Hurt the kidneys"        Medication List        Accurate as of July 18, 2021 11:48 AM. If you have any questions, ask your nurse or doctor.          Baclofen 5 MG Tabs Take 5 mg by mouth 2 (two) times daily as needed.   cilostazol 100 MG tablet Commonly known as: PLETAL Take 1 tablet (100 mg total) by mouth 2 (two) times daily.   diphenoxylate-atropine 2.5-0.025 MG tablet Commonly known as: LOMOTIL Take 1 tablet by mouth 4 (four) times daily as needed for diarrhea or loose stools.   DULoxetine 20 MG capsule Commonly known as: Cymbalta Take 1 capsule (20 mg total) by mouth daily.   Ensure Take 237 mLs by mouth 2 (two) times daily.   lactose free nutrition Liqd Take 237 mLs by mouth 2 (two) times daily between meals.   famotidine 40 MG tablet Commonly known as: PEPCID TAKE 1 TABLET BY MOUTH EVERY DAY   fluticasone 50 MCG/ACT nasal spray Commonly known as: FLONASE SPRAY 2 SPRAYS INTO EACH NOSTRIL EVERY DAY   hydrALAZINE 50 MG tablet Commonly known as: APRESOLINE TAKE 1.5 TABLETS (75 MG TOTAL) BY MOUTH 3 (THREE) TIMES DAILY.   isosorbide mononitrate 60 MG 24 hr tablet Commonly known as: IMDUR Take 1 tablet (60 mg total) by mouth daily.   levothyroxine 50 MCG tablet Commonly known as: SYNTHROID TAKE 1 TABLET BY MOUTH DAILY BEFORE BREAKFAST   loperamide 2 MG tablet Commonly known as: IMODIUM A-D Take 2 mg by mouth 4 (four) times daily as needed for diarrhea or loose stools.   LORazepam 1 MG tablet Commonly known as: ATIVAN TAKE 1 TABLET BY MOUTH TWICE A DAY   metoprolol tartrate 50 MG tablet Commonly known as: LOPRESSOR Take 1 tablet (50 mg total) by mouth 2 (two) times daily.   Olmesartan-amLODIPine-HCTZ 20-5-12.5 MG Tabs Take 1 tablet by mouth  daily.   ondansetron 8 MG disintegrating tablet Commonly known as: Zofran ODT Take 1 tablet (8 mg total) by mouth every 8 (eight) hours as needed for nausea or vomiting.   promethazine 25 MG tablet Commonly known as: PHENERGAN TAKE 1 TABLET BY MOUTH EVERY 8 HOURS AS NEEDED FOR NAUSEA OR VOMITING.   rosuvastatin 20 MG tablet Commonly known as: CRESTOR Take 1 tablet (20 mg total) by mouth daily.   thiamine 100 MG tablet Commonly known as: Vitamin B-1 Take 1 tablet (100 mg total) by mouth daily.           Objective:   Physical Exam Ht 5\' 4"  (1.626 m)   Wt  120 lb (54.4 kg)   BMI 20.60 kg/m  This is a virtual video visit, she is lying down in bed, unable to talk (has removed the stoma valve) but we communicated very well with the help of her granddaughter.  She is in no distress, she understands me well, answer my questions moving her head.  No cough noted, not toxic appearing.  No vital signs available    Assessment      75 year old female, PMH includes GERD, CAD, CKD, COPD, neck malignancy with history of radiation therapy with neck stoma with no recent chemotherapy or radiation therapy, history of hypopharyngeal stricture, PAD, presents with:  COVID-20  75 years old, has multiple medical problems, had 2 previous COVID vaccines, developed classic COVID symptoms 2 days ago and tested positive. She has a number of medical problems, including stoma at the trachea, she is able to take multiple medications p.o. every day, she is able to drink fluids. We talk about treatment, it would be very taxing for her to get an infusion of monoclonal antibodies thus we agreed to\\n: Molnupiravir x 5 d In addition she needs to keep up with hydration and nutrition. ER if symptoms increase. She will be contagious for a total of 10 days Recommend a COVID booster in 4 weeks. She asked me about her prognosis and I think that given the fact that she was vaccinated and on  monupiravir, her prognosis  is  very good nevertheless needs to pay attention to her symptoms. The patient and her granddaughter verbalized understanding    I discussed the assessment and treatment plan with the patient. The patient was provided an opportunity to ask questions and all were answered. The patient agreed with the plan and demonstrated an understanding of the instructions.   The patient was advised to call back or seek an in-person evaluation if the symptoms worsen or if the condition fails to improve as anticipated.

## 2021-07-18 NOTE — Telephone Encounter (Signed)
Just a fyi

## 2021-07-18 NOTE — Telephone Encounter (Addendum)
   Patient test covid+ on 8/2 home test Patient complains of body ache and sore throat No fever  Virtual visit 8/5 Dr Larose Kells

## 2021-07-19 ENCOUNTER — Other Ambulatory Visit: Payer: Self-pay | Admitting: Internal Medicine

## 2021-07-21 NOTE — Telephone Encounter (Signed)
Last OV: 07/11/21. No future appts scheduled

## 2021-07-24 DIAGNOSIS — M5416 Radiculopathy, lumbar region: Secondary | ICD-10-CM | POA: Diagnosis not present

## 2021-07-24 DIAGNOSIS — I10 Essential (primary) hypertension: Secondary | ICD-10-CM | POA: Diagnosis not present

## 2021-07-30 ENCOUNTER — Other Ambulatory Visit: Payer: Self-pay | Admitting: Internal Medicine

## 2021-08-11 ENCOUNTER — Other Ambulatory Visit: Payer: Self-pay

## 2021-08-11 ENCOUNTER — Encounter: Payer: Self-pay | Admitting: Internal Medicine

## 2021-08-11 ENCOUNTER — Ambulatory Visit (INDEPENDENT_AMBULATORY_CARE_PROVIDER_SITE_OTHER): Payer: Medicare Other

## 2021-08-11 ENCOUNTER — Ambulatory Visit (INDEPENDENT_AMBULATORY_CARE_PROVIDER_SITE_OTHER): Payer: Medicare Other | Admitting: Internal Medicine

## 2021-08-11 VITALS — BP 138/90 | HR 64 | Temp 97.7°F | Ht 64.0 in

## 2021-08-11 DIAGNOSIS — E440 Moderate protein-calorie malnutrition: Secondary | ICD-10-CM

## 2021-08-11 DIAGNOSIS — I129 Hypertensive chronic kidney disease with stage 1 through stage 4 chronic kidney disease, or unspecified chronic kidney disease: Secondary | ICD-10-CM | POA: Diagnosis not present

## 2021-08-11 DIAGNOSIS — J449 Chronic obstructive pulmonary disease, unspecified: Secondary | ICD-10-CM | POA: Diagnosis not present

## 2021-08-11 DIAGNOSIS — N184 Chronic kidney disease, stage 4 (severe): Secondary | ICD-10-CM

## 2021-08-11 DIAGNOSIS — R413 Other amnesia: Secondary | ICD-10-CM

## 2021-08-11 DIAGNOSIS — I249 Acute ischemic heart disease, unspecified: Secondary | ICD-10-CM

## 2021-08-11 DIAGNOSIS — R0602 Shortness of breath: Secondary | ICD-10-CM | POA: Diagnosis not present

## 2021-08-11 LAB — COMPREHENSIVE METABOLIC PANEL
ALT: 11 U/L (ref 0–35)
AST: 14 U/L (ref 0–37)
Albumin: 4.2 g/dL (ref 3.5–5.2)
Alkaline Phosphatase: 70 U/L (ref 39–117)
BUN: 43 mg/dL — ABNORMAL HIGH (ref 6–23)
CO2: 23 mEq/L (ref 19–32)
Calcium: 10.3 mg/dL (ref 8.4–10.5)
Chloride: 107 mEq/L (ref 96–112)
Creatinine, Ser: 2.51 mg/dL — ABNORMAL HIGH (ref 0.40–1.20)
GFR: 18.25 mL/min — ABNORMAL LOW (ref >60.00)
Glucose, Bld: 92 mg/dL (ref 70–99)
Potassium: 3.9 mEq/L (ref 3.5–5.1)
Sodium: 143 mEq/L (ref 135–145)
Total Bilirubin: 0.4 mg/dL (ref 0.2–1.2)
Total Protein: 7.4 g/dL (ref 6.0–8.3)

## 2021-08-11 LAB — CBC
HCT: 32.2 % — ABNORMAL LOW (ref 36.0–46.0)
Hemoglobin: 10.8 g/dL — ABNORMAL LOW (ref 12.0–15.0)
MCHC: 33.4 g/dL (ref 30.0–36.0)
MCV: 86.3 fl (ref 78.0–100.0)
Platelets: 233 10*3/uL (ref 150.0–400.0)
RBC: 3.73 Mil/uL — ABNORMAL LOW (ref 3.87–5.11)
RDW: 13.4 % (ref 11.5–15.5)
WBC: 7.3 10*3/uL (ref 4.0–10.5)

## 2021-08-11 LAB — URINALYSIS, ROUTINE W REFLEX MICROSCOPIC
Bilirubin Urine: NEGATIVE
Hgb urine dipstick: NEGATIVE
Ketones, ur: NEGATIVE
Leukocytes,Ua: NEGATIVE
Nitrite: NEGATIVE
RBC / HPF: NONE SEEN (ref 0–?)
Specific Gravity, Urine: 1.02 (ref 1.000–1.030)
Total Protein, Urine: 100 — AB
Urine Glucose: NEGATIVE
Urobilinogen, UA: 0.2 (ref 0.0–1.0)
pH: 6 (ref 5.0–8.0)

## 2021-08-11 NOTE — Progress Notes (Signed)
   Subjective:   Patient ID: [name], female    DOB: 09-11-1946, 75 y.o.   MRN: [id number]  HPI The patient is a 75 YO female coming in with daughter in law and son for acute memory change and behavioral changes. She is not recognizing son and is talking about going home and not eating/drinking well. She denies SOB or change in mucus. Denies chest pains or abdominal pain. No diarrhea or constipation. Urinating less than usual. Some waxing and waning to mental status. Ate a little better today than yesterday. They feel unable to care for her and need help in the home. DIL provides most of the history.   Review of Systems  Constitutional:  Positive for activity change, appetite change and fatigue.  HENT: Negative.    Eyes: Negative.   Respiratory:  Negative for cough, chest tightness and shortness of breath.   Cardiovascular:  Negative for chest pain, palpitations and leg swelling.  Gastrointestinal:  Negative for abdominal distention, abdominal pain, constipation, diarrhea, nausea and vomiting.  Musculoskeletal: Negative.   Skin: Negative.   Neurological: Negative.   Psychiatric/Behavioral:  Positive for decreased concentration and dysphoric mood.    Objective:  Physical Exam Constitutional:      Appearance: She is well-developed.  HENT:     Head: Normocephalic and atraumatic.  Cardiovascular:     Rate and Rhythm: Normal rate and regular rhythm.  Pulmonary:     Effort: Pulmonary effort is normal. No respiratory distress.     Breath sounds: Normal breath sounds. No wheezing or rales.     Comments: Trach in place not infected appearing Abdominal:     General: Bowel sounds are normal. There is no distension.     Palpations: Abdomen is soft.     Tenderness: There is no abdominal tenderness. There is no rebound.  Musculoskeletal:     Cervical back: Normal range of motion.  Skin:    General: Skin is warm and dry.  Neurological:     Mental Status: She is alert.      Coordination: Coordination abnormal.     Comments: Not interactive like normal during visit    Vitals:   08/11/21 0903  BP: 138/90  Pulse: 64  Temp: 97.7 F (36.5 C)  TempSrc: Oral  SpO2: 98%  Height: 5\' 4"  (1.626 m)    This visit occurred during the SARS-CoV-2 public health emergency.  Safety protocols were in place, including screening questions prior to the visit, additional usage of staff PPE, and extensive cleaning of exam room while observing appropriate contact time as indicated for disinfecting solutions.   Assessment & Plan:

## 2021-08-11 NOTE — Patient Instructions (Signed)
We will check the labs and x-ray and if normal will get a scan of the brain.  We will get help coming to the house they should call you.

## 2021-08-12 ENCOUNTER — Encounter: Payer: Self-pay | Admitting: Internal Medicine

## 2021-08-12 ENCOUNTER — Other Ambulatory Visit: Payer: Self-pay | Admitting: Internal Medicine

## 2021-08-12 ENCOUNTER — Telehealth: Payer: Self-pay | Admitting: Internal Medicine

## 2021-08-12 DIAGNOSIS — R413 Other amnesia: Secondary | ICD-10-CM

## 2021-08-12 LAB — URINE CULTURE

## 2021-08-12 NOTE — Assessment & Plan Note (Signed)
Given poor intake and appetite needs CMP for check on GFR.

## 2021-08-12 NOTE — Assessment & Plan Note (Signed)
She is not eating well and they are trying to get some nutrition and calories into her via supplements.

## 2021-08-12 NOTE — Telephone Encounter (Signed)
Calling on behalf of patient  Says she is very concerned bc patient is having trouble recognizing family members & is going into the closet saying she wants to go home (when patient is currently at home)

## 2021-08-12 NOTE — Assessment & Plan Note (Signed)
Lung exam stable from prior. Given acute mental status change checking CXR for pneumonia or fluid.

## 2021-08-12 NOTE — Assessment & Plan Note (Signed)
We need to rule out infection with sudden acute change in mental status which is waxing and waning. She has also stopped all medications for unknown period of time. Now family is making sure she takes her medications. BP was very high for several days to 200/130s. Checking CBC and CMP and U/A and urine culture and chest x-ray to rule out infection and other metabolic cause. If no infection found needs CT head to rule out stroke. She is outside window for intervention.

## 2021-08-13 NOTE — Telephone Encounter (Signed)
See below

## 2021-08-14 ENCOUNTER — Ambulatory Visit
Admission: RE | Admit: 2021-08-14 | Discharge: 2021-08-14 | Disposition: A | Discharge status: Home / Self Care | Payer: Medicare Other | Source: Ambulatory Visit | Attending: Internal Medicine | Admitting: Internal Medicine

## 2021-08-14 DIAGNOSIS — R413 Other amnesia: Secondary | ICD-10-CM

## 2021-08-14 DIAGNOSIS — R4182 Altered mental status, unspecified: Secondary | ICD-10-CM | POA: Diagnosis not present

## 2021-08-14 NOTE — Telephone Encounter (Addendum)
   Daughter given phone number to schedule with [place] [facility]. She states patient is talking inappropriately , referencing deceased family members

## 2021-08-14 NOTE — Telephone Encounter (Signed)
Can you check if they are scheduled for imaging of the head yet?

## 2021-08-20 ENCOUNTER — Telehealth: Payer: Self-pay

## 2021-08-20 NOTE — Telephone Encounter (Signed)
Called pt spoke to pts relative. Pts relative will call tomorrow to schedule and AWV as well as a possible follow up appt. Also wanted to know about prolia injections it looks like pt had injections before which was las t done in 09/2020.  KP

## 2021-08-27 ENCOUNTER — Other Ambulatory Visit: Payer: Self-pay | Admitting: Internal Medicine

## 2021-08-27 DIAGNOSIS — K219 Gastro-esophageal reflux disease without esophagitis: Secondary | ICD-10-CM

## 2021-08-27 DIAGNOSIS — R1314 Dysphagia, pharyngoesophageal phase: Secondary | ICD-10-CM

## 2021-09-01 DIAGNOSIS — Z23 Encounter for immunization: Secondary | ICD-10-CM | POA: Diagnosis not present

## 2021-09-05 ENCOUNTER — Other Ambulatory Visit: Payer: Self-pay | Admitting: Internal Medicine

## 2021-09-09 ENCOUNTER — Telehealth: Payer: Self-pay

## 2021-09-09 NOTE — Telephone Encounter (Addendum)
Prolia VOB initiated via [email or web address]  Last OV:  Next OV:  Last Prolia inj: 10/03/20 Next Prolia inj DUE: 04/04/21

## 2021-09-18 ENCOUNTER — Other Ambulatory Visit: Payer: Self-pay | Admitting: Internal Medicine

## 2021-10-08 ENCOUNTER — Ambulatory Visit: Payer: Medicare Other

## 2021-10-08 ENCOUNTER — Other Ambulatory Visit: Payer: Self-pay

## 2021-10-18 NOTE — Telephone Encounter (Signed)
Pt ready for scheduling on or after 04/04/21  Out-of-pocket cost due at time of visit: $0.00  Primary: Medicare Prolia co-insurance: 20% (approximately $270) Admin fee co-insurance: 20% (approximately $25) s patient is enrolled in the Qualified Medicare Beneficiary [employer]) program and will not be subject to deductible or coinsurance for the administration and cost of Prolia.  Secondary: Medicaid Prolia co-insurance: there is no coverage for Prolia through the patient's secondary plan [facility] due to an exclusion Admin fee co-insurance: there is no coverage for Prolia through the patient's secondary plan [facility] due to an exclusion.  Deductible: does not apply  Prior Auth: not required PA# Valid:   ** This summary of benefits is an estimation of the patient's out-of-pocket cost. Exact cost may vary based on individual plan coverage.

## 2021-10-30 DIAGNOSIS — J9503 Malfunction of tracheostomy stoma: Secondary | ICD-10-CM | POA: Diagnosis not present

## 2021-10-30 DIAGNOSIS — K222 Esophageal obstruction: Secondary | ICD-10-CM | POA: Diagnosis not present

## 2021-10-30 DIAGNOSIS — Z8521 Personal history of malignant neoplasm of larynx: Secondary | ICD-10-CM | POA: Diagnosis not present

## 2021-10-30 DIAGNOSIS — R1310 Dysphagia, unspecified: Secondary | ICD-10-CM | POA: Diagnosis not present

## 2021-10-30 DIAGNOSIS — Z9002 Acquired absence of larynx: Secondary | ICD-10-CM | POA: Diagnosis not present

## 2021-10-30 DIAGNOSIS — R491 Aphonia: Secondary | ICD-10-CM | POA: Diagnosis not present

## 2021-10-30 DIAGNOSIS — Z87891 Personal history of nicotine dependence: Secondary | ICD-10-CM | POA: Diagnosis not present

## 2021-10-30 DIAGNOSIS — J392 Other diseases of pharynx: Secondary | ICD-10-CM | POA: Diagnosis not present

## 2021-10-31 ENCOUNTER — Telehealth: Payer: Self-pay | Admitting: Internal Medicine

## 2021-10-31 NOTE — Telephone Encounter (Signed)
Patient's relative Janett Billow requesting a call back to discuss alternative medication for metoprolol tartrate (LOPRESSOR) 50 MG tablet  Janett Billow is listed on dpr

## 2021-11-04 NOTE — Telephone Encounter (Signed)
Spoke with Janett Billow and she states that she would like to be started back on Atenolol. She feels that the metoprolol isn't working.

## 2021-11-05 DIAGNOSIS — K222 Esophageal obstruction: Secondary | ICD-10-CM | POA: Diagnosis not present

## 2021-11-05 DIAGNOSIS — Z963 Presence of artificial larynx: Secondary | ICD-10-CM | POA: Diagnosis not present

## 2021-11-05 DIAGNOSIS — R1314 Dysphagia, pharyngoesophageal phase: Secondary | ICD-10-CM | POA: Diagnosis not present

## 2021-11-05 DIAGNOSIS — J398 Other specified diseases of upper respiratory tract: Secondary | ICD-10-CM | POA: Diagnosis not present

## 2021-11-05 DIAGNOSIS — Q387 Congenital pharyngeal pouch: Secondary | ICD-10-CM | POA: Diagnosis not present

## 2021-11-05 DIAGNOSIS — R490 Dysphonia: Secondary | ICD-10-CM | POA: Diagnosis not present

## 2021-11-05 NOTE — Telephone Encounter (Signed)
LVM for Janett Billow to give our office a call. Office number was provided

## 2021-11-05 NOTE — Telephone Encounter (Signed)
I don't see that she has been on atenolol for more than 1 year. We may need to discuss at visit or at least have more information about why she thinks this is not working? Her blood pressure and heart rates have been normal recently.

## 2021-11-17 NOTE — Telephone Encounter (Signed)
LVM to schedule appt for injection.  

## 2021-11-26 ENCOUNTER — Other Ambulatory Visit: Payer: Self-pay

## 2021-11-26 ENCOUNTER — Ambulatory Visit (INDEPENDENT_AMBULATORY_CARE_PROVIDER_SITE_OTHER): Payer: Medicare Other

## 2021-11-26 ENCOUNTER — Encounter (HOSPITAL_COMMUNITY): Payer: Self-pay

## 2021-11-26 ENCOUNTER — Ambulatory Visit (HOSPITAL_COMMUNITY)
Admission: EM | Admit: 2021-11-26 | Discharge: 2021-11-26 | Disposition: A | Discharge status: Home / Self Care | Payer: Medicare Other | Attending: Urgent Care | Admitting: Urgent Care

## 2021-11-26 DIAGNOSIS — Z85819 Personal history of malignant neoplasm of unspecified site of lip, oral cavity, and pharynx: Secondary | ICD-10-CM

## 2021-11-26 DIAGNOSIS — Z8521 Personal history of malignant neoplasm of larynx: Secondary | ICD-10-CM | POA: Diagnosis not present

## 2021-11-26 DIAGNOSIS — Z79899 Other long term (current) drug therapy: Secondary | ICD-10-CM | POA: Insufficient documentation

## 2021-11-26 DIAGNOSIS — J440 Chronic obstructive pulmonary disease with acute lower respiratory infection: Secondary | ICD-10-CM | POA: Insufficient documentation

## 2021-11-26 DIAGNOSIS — I129 Hypertensive chronic kidney disease with stage 1 through stage 4 chronic kidney disease, or unspecified chronic kidney disease: Secondary | ICD-10-CM | POA: Diagnosis not present

## 2021-11-26 DIAGNOSIS — Z9049 Acquired absence of other specified parts of digestive tract: Secondary | ICD-10-CM | POA: Insufficient documentation

## 2021-11-26 DIAGNOSIS — R052 Subacute cough: Secondary | ICD-10-CM

## 2021-11-26 DIAGNOSIS — Z930 Tracheostomy status: Secondary | ICD-10-CM | POA: Diagnosis not present

## 2021-11-26 DIAGNOSIS — R059 Cough, unspecified: Secondary | ICD-10-CM

## 2021-11-26 DIAGNOSIS — R6889 Other general symptoms and signs: Secondary | ICD-10-CM | POA: Diagnosis present

## 2021-11-26 DIAGNOSIS — N184 Chronic kidney disease, stage 4 (severe): Secondary | ICD-10-CM

## 2021-11-26 DIAGNOSIS — Z20822 Contact with and (suspected) exposure to covid-19: Secondary | ICD-10-CM | POA: Insufficient documentation

## 2021-11-26 DIAGNOSIS — J209 Acute bronchitis, unspecified: Secondary | ICD-10-CM

## 2021-11-26 LAB — RESPIRATORY PANEL BY PCR

## 2021-11-26 MED ORDER — PREDNISONE 20 MG PO TABS: ORAL_TABLET | ORAL | 0 refills | Status: DC

## 2021-11-26 MED ORDER — BENZONATATE 100 MG PO CAPS: 100.0000 mg | ORAL_CAPSULE | Freq: Three times a day (TID) | ORAL | 0 refills | Status: DC | PRN

## 2021-11-26 NOTE — ED Triage Notes (Signed)
Per son translating, pt is c/o productive cough and sob x3 days. Pt has a trach but denies being on oxygen. Pt speaking normal per son. No distress at this time

## 2021-11-26 NOTE — ED Provider Notes (Signed)
[facility]   MRN: [id number] DOB: 10-Apr-1946  Subjective:   [name] is a 75 y.o. female presenting for 3-day history of acute onset persistent productive cough, throat congestion.  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  She does have a tracheostomy.  Reports that her primary problem is in that area.  No fever, body aches, chest pain, shortness of breath.  Despite her medical record showing that she has a history of COPD, her son who presents with her is adamant that she does not have this diagnosis.  Patient also denies it.  Has never had to use any kind of inhalers.  She does have a history of hypertension, is supposed to take medications twice daily and has not taken her nightly dose.  Denies headache, confusion, weakness, numbness or tingling.  No current facility-administered medications for this encounter.  Current Outpatient Medications:    Baclofen 5 MG TABS, TAKE 5 MG BY MOUTH 2 (TWO) TIMES DAILY AS NEEDED., Disp: 30 tablet, Rfl: 0   cilostazol (PLETAL) 100 MG tablet, Take 1 tablet (100 mg total) by mouth 2 (two) times daily., Disp: 180 tablet, Rfl: 3   diphenoxylate-atropine (LOMOTIL) 2.5-0.025 MG tablet, Take 1 tablet by mouth 4 (four) times daily as needed for diarrhea or loose stools., Disp: 75 tablet, Rfl: 1   DULoxetine (CYMBALTA) 20 MG capsule, Take 1 capsule (20 mg total) by mouth daily., Disp: 90 capsule, Rfl: 3   Ensure (ENSURE), Take 237 mLs by mouth 2 (two) times daily., Disp: , Rfl:    famotidine (PEPCID) 40 MG tablet, TAKE 1 TABLET BY MOUTH EVERY DAY, Disp: 90 tablet, Rfl: 1   fluticasone (FLONASE) 50 MCG/ACT nasal spray, SPRAY 2 SPRAYS INTO EACH NOSTRIL EVERY DAY, Disp: 48 mL, Rfl: 2   hydrALAZINE (APRESOLINE) 50 MG tablet, TAKE 1.5 TABLETS (75 MG TOTAL) BY MOUTH 3 (THREE) TIMES DAILY., Disp: 405 tablet, Rfl: 1   isosorbide mononitrate (IMDUR) 60 MG 24 hr  tablet, Take 1 tablet (60 mg total) by mouth daily., Disp: 90 tablet, Rfl: 3   lactose free nutrition (BOOST) LIQD, Take 237 mLs by mouth 2 (two) times daily between meals., Disp: , Rfl:    levothyroxine (SYNTHROID) 50 MCG tablet, TAKE 1 TABLET BY MOUTH DAILY BEFORE BREAKFAST, Disp: 90 tablet, Rfl: 3   loperamide (IMODIUM A-D) 2 MG tablet, Take 2 mg by mouth 4 (four) times daily as needed for diarrhea or loose stools., Disp: , Rfl:    LORazepam (ATIVAN) 1 MG tablet, TAKE 1 TABLET BY MOUTH TWICE A DAY, Disp: 60 tablet, Rfl: 5   metoprolol tartrate (LOPRESSOR) 50 MG tablet, Take 1 tablet (50 mg total) by mouth 2 (two) times daily., Disp: 180 tablet, Rfl: 3   Olmesartan-amLODIPine-HCTZ 20-5-12.5 MG TABS, Take 1 tablet by mouth daily., Disp: 90 tablet, Rfl: 3   ondansetron (ZOFRAN ODT) 8 MG disintegrating tablet, Take 1 tablet (8 mg total) by mouth every 8 (eight) hours as needed for nausea or vomiting., Disp: 30 tablet, Rfl: 0   promethazine (PHENERGAN) 25 MG tablet, TAKE 1 TABLET BY MOUTH EVERY 8 HOURS AS NEEDED FOR NAUSEA OR VOMITING., Disp: 60 tablet, Rfl: 3   rosuvastatin (CRESTOR) 20 MG tablet, Take 1 tablet (20 mg total) by mouth daily., Disp: 90 tablet, Rfl: 3   thiamine (VITAMIN B-1) 100 MG tablet, Take 1 tablet (100 mg total) by mouth daily., Disp: 90  tablet, Rfl: 1   Allergies  Allergen Reactions   Xyzal [Levocetirizine Dihydrochloride] Itching   Augmentin [Amoxicillin-Pot Clavulanate] Diarrhea and Nausea And Vomiting   Tribenzor [Olmesartan-Amlodipine-Hctz] Other (See Comments)    "Hurt the kidneys"    Past Medical History:  Diagnosis Date   Anemia    Anxiety    takes Ativan prn   Blood transfusion without reported diagnosis 09/15/12   2 units Prbc's   Broken ribs    Chronic back pain    CKD (chronic kidney disease), stage IV (HCC)    Constipation    related to pain meds   COPD (chronic obstructive pulmonary disease) ([place]) 08/10/2012   denies   Depression    Gastrostomy in  place [facility])    removed   GERD (gastroesophageal reflux disease)    takes Zantac daily   Headache(784.0)    Hiatal hernia 08/10/2012   History of radiation therapy 10/17/12-11/25/12   supraglottic larynx,high risk neck tumor bed 5880 cGy/28 sessions, high risk lymph node tumor bed 5600 cGy/20 sessions, mod risk lymph node tumor bed 5040 cGy/20 sessions   Hypercholesteremia    takes Pravastatin daily   Hypertension    takes Tribenzor and Atenolol daily   Insomnia    takes Amitriptyline daily   Nausea    takes Zofran prn   PAD (peripheral artery disease) ([place])    noninvasive imaging in 2016   Pneumonia    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 08/08/2012   Shortness of breath dyspnea    Stroke [facility]) 2011   denies. no residual   Uterine cancer [facility])      Past Surgical History:  Procedure Laterality Date   ABDOMINAL SURGERY     r/t uterine carcinoma   APPENDECTOMY     DIRECT LARYNGOSCOPY N/A 05/22/2014   Procedure: DIRECT LARYNGOSCOPY WITH ESOPHAGEAL DILATION;  Surgeon: Jerrell Belfast, MD;  Location: [facility] OR;  Service: ENT;  Laterality: N/A;   ESOPHAGOSCOPY WITH DILITATION N/A 05/29/2015   Procedure: ESOPHAGOSCOPY WITH ESOPHAGEAL DILITATION;  Surgeon: Jerrell Belfast, MD;  [facility];  Service: ENT;  Laterality: N/A;   Gastrostomy Tube removed   2013   GASTROSTOMY W/ FEEDING TUBE  12   HERNIA REPAIR     child   LARYNGETOMY  08/31/2012   Procedure: LARYNGECTOMY;  Surgeon: Jerrell Belfast, MD;  Location: [place];  Service: ENT;  Laterality: N/A;   LARYNGOSCOPY  08/10/2012   Procedure: LARYNGOSCOPY;  Surgeon: Jerrell Belfast, MD;  Location: [facility] ORS;  Service: ENT;  Laterality: N/A;  with biopsy   RADICAL NECK DISSECTION  08/31/2012   Procedure: RADICAL NECK DISSECTION;  Surgeon: Jerrell Belfast, MD;  Location: [place];  Service: ENT;  Laterality: Bilateral;   TRACHEAL ESOPHOGEAL PUNCTURE WITH REPAIR STOMA N/A 09/08/2013   Procedure: TRACHEAL ESOPHOGEAL PUNCTURE WITH PLACEMENT  OF  PROVOX PROSTHESIS ;  Surgeon: Jerrell Belfast, MD;  Location: [place];  Service: ENT;  Laterality: N/A;   TRACHEOSTOMY TUBE PLACEMENT  08/10/2012   Procedure: TRACHEOSTOMY;  Surgeon: Jerrell Belfast, MD;  Location: [facility];  Service: ENT;  Laterality: N/A;    Family History  Problem Relation Age of Onset   Throat cancer Father    Brain cancer Brother    CAD Neg Hx     Social History   Tobacco Use   Smoking status: Former    Packs/day: 0.25    Years: 50.00    Pack years: 12.50    Types: Cigarettes    Quit date:  05/27/2013    Years since quitting: 8.5   Smokeless tobacco: Never  Vaping Use   Vaping Use: Never used  Substance Use Topics   Alcohol use: Yes    Comment: 1-2 shots per week   Drug use: No    ROS   Objective:   Vitals: BP (!) 236/90 (BP Location: Right Arm)    Pulse 86    Temp 99.1 F (37.3 C) (Oral)    Resp 20    SpO2 100%   Physical Exam Constitutional:      General: She is not in acute distress.    Appearance: Normal appearance. She is well-developed. She is not ill-appearing, toxic-appearing or diaphoretic.  HENT:     Head: Normocephalic and atraumatic.     Nose: Nose normal.     Mouth/Throat:     Mouth: Mucous membranes are moist.  Eyes:     Extraocular Movements: Extraocular movements intact.     Pupils: Pupils are equal, round, and reactive to light.  Cardiovascular:     Rate and Rhythm: Normal rate and regular rhythm.     Pulses: Normal pulses.     Heart sounds: Normal heart sounds. No murmur heard.   No friction rub. No gallop.  Pulmonary:     Effort: Pulmonary effort is normal. No respiratory distress.     Breath sounds: Normal breath sounds. No stridor. No wheezing, rhonchi or rales.  Skin:    General: Skin is warm and dry.     Findings: No rash.  Neurological:     Mental Status: She is alert and oriented to person, place, and time.  Psychiatric:        Mood and Affect: Mood normal.        Behavior: Behavior normal.        Thought  Content: Thought content normal.    DG Chest 2 View  Result Date: 11/26/2021 CLINICAL DATA:  Productive cough EXAM: CHEST - 2 VIEW COMPARISON:  08/11/2021 FINDINGS: Bronchitic changes. No focal consolidation, pleural effusion or pneumothorax. Stable cardiomediastinal silhouette with aortic atherosclerosis. IMPRESSION: Bronchitic changes without focal airspace disease. Electronically Signed   By: Donavan Foil M.D.   On: 11/26/2021 19:06     Assessment and Plan :   PDMP not reviewed this encounter.  1. Acute bronchitis, unspecified organism   2. Subacute cough   3. Throat congestion   4. History of throat cancer   5. Tracheostomy in place [facility])   6. Chronic kidney disease (CKD), stage IV (severe) (HCC)    Recommended an oral prednisone course especially in light of her history of COPD and CKD 4.  We will use supportive care otherwise.  COVID-19 testing, respiratory panel testing.  Patient would be an excellent candidate for molnupiravir should she test positive for COVID-19. Counseled patient on potential for adverse effects with medications prescribed/recommended today, ER and return-to-clinic precautions discussed, patient verbalized understanding.    Jaynee Eagles, [place] 11/26/21 1791

## 2021-11-27 ENCOUNTER — Other Ambulatory Visit: Payer: Self-pay | Admitting: Internal Medicine

## 2021-11-27 DIAGNOSIS — F411 Generalized anxiety disorder: Secondary | ICD-10-CM

## 2021-11-27 LAB — SARS CORONAVIRUS 2 (TAT 6-24 HRS): SARS Coronavirus 2: NEGATIVE

## 2021-11-28 ENCOUNTER — Telehealth: Payer: Self-pay

## 2021-11-28 NOTE — Telephone Encounter (Signed)
1.Medication Requested: Lorazepam  2. Pharmacy (Name, [place]): [place]  3. On Med List: yes  4. Last Visit with PCP: 08/11/21   Pt has been without the medication for two and is shaken and did not notified her daughter in law to call it in.      Agent: Please be advised that RX refills may take up to 3 business days. We ask that you follow-up with your pharmacy.

## 2021-11-28 NOTE — Telephone Encounter (Signed)
See below

## 2021-11-28 NOTE — Telephone Encounter (Signed)
This is duplicate already sent in via med refill encounter.

## 2021-12-17 DIAGNOSIS — R490 Dysphonia: Secondary | ICD-10-CM | POA: Diagnosis not present

## 2021-12-22 NOTE — Telephone Encounter (Signed)
Prolia VOB initiated for 2023. °

## 2021-12-23 ENCOUNTER — Telehealth (INDEPENDENT_AMBULATORY_CARE_PROVIDER_SITE_OTHER): Payer: Medicare Other | Admitting: Family Medicine

## 2021-12-23 ENCOUNTER — Other Ambulatory Visit: Payer: Self-pay

## 2021-12-23 DIAGNOSIS — R059 Cough, unspecified: Secondary | ICD-10-CM

## 2021-12-23 MED ORDER — BENZONATATE 100 MG PO CAPS: ORAL_CAPSULE | ORAL | 0 refills | Status: DC

## 2021-12-23 MED ORDER — DOXYCYCLINE HYCLATE 100 MG PO TABS: 100.0000 mg | ORAL_TABLET | Freq: Two times a day (BID) | ORAL | 0 refills | Status: DC

## 2021-12-23 NOTE — Progress Notes (Signed)
Virtual Visit via Video Note  I connected with [name]  on 12/23/21 at  1:00 PM EST by a video enabled telemedicine application and verified that I am speaking with the correct person using two identifiers.  Location patient: [place] Location provider:work or home office Persons participating in the virtual visit: patient, provider, daughter  I discussed the limitations and requested verbal permission for telemedicine visit. The patient expressed understanding and agreed to proceed.   HPI:  Acute telemedicine visit for a cough: -Onset: several weeks ago -dx with bronchitis and was given a cough medication, but did not improve and recently mucus is thicker -Symptoms include:cough with thick mucus, ? Subjective fever at night -Denies:CP, SOB, NVD, body aches -Pertinent past medical history: see below - she denies COPD listed on PMH -Pertinent medication allergies: Allergies  Allergen Reactions   Xyzal [Levocetirizine Dihydrochloride] Itching   Augmentin [Amoxicillin-Pot Clavulanate] Diarrhea and Nausea And Vomiting   Tribenzor [Olmesartan-Amlodipine-Hctz] Other (See Comments)    "Hurt the kidneys"  -COVID-19 vaccine status:  Immunization History  Administered Date(s) Administered   Fluad Quad(high Dose 65+) 08/08/2019, 12/20/2020   Influenza, High Dose Seasonal PF 11/20/2014, 10/11/2015, 01/22/2017, 12/28/2017, 01/19/2019   PFIZER(Purple Top)SARS-COV-2 Vaccination 09/03/2020, 09/24/2020   Pneumococcal Conjugate-13 02/01/2015   Pneumococcal Polysaccharide-23 08/10/2012, 01/19/2019   Tdap 12/28/2017     ROS: See pertinent positives and negatives per HPI.  Past Medical History:  Diagnosis Date   Anemia    Anxiety    takes Ativan prn   Blood transfusion without reported diagnosis 09/15/12   2 units Prbc's   Broken ribs    Chronic back pain    CKD (chronic kidney disease), stage IV (HCC)    Constipation    related to pain meds   COPD (chronic obstructive pulmonary disease) ([place])  08/10/2012   denies   Depression    Gastrostomy in place [facility])    removed   GERD (gastroesophageal reflux disease)    takes Zantac daily   Headache(784.0)    Hiatal hernia 08/10/2012   History of radiation therapy 10/17/12-11/25/12   supraglottic larynx,high risk neck tumor bed 5880 cGy/28 sessions, high risk lymph node tumor bed 5600 cGy/20 sessions, mod risk lymph node tumor bed 5040 cGy/20 sessions   Hypercholesteremia    takes Pravastatin daily   Hypertension    takes Tribenzor and Atenolol daily   Insomnia    takes Amitriptyline daily   Nausea    takes Zofran prn   PAD (peripheral artery disease) ([place])    noninvasive imaging in 2016   Pneumonia    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 08/08/2012   Shortness of breath dyspnea    Stroke [facility]) 2011   denies. no residual   Uterine cancer ([place])     Past Surgical History:  Procedure Laterality Date   ABDOMINAL SURGERY     r/t uterine carcinoma   APPENDECTOMY     DIRECT LARYNGOSCOPY N/A 05/22/2014   Procedure: DIRECT LARYNGOSCOPY WITH ESOPHAGEAL DILATION;  Surgeon: Jerrell Belfast, MD;  Location: [place];  Service: ENT;  Laterality: N/A;   ESOPHAGOSCOPY WITH DILITATION N/A 05/29/2015   Procedure: ESOPHAGOSCOPY WITH ESOPHAGEAL DILITATION;  Surgeon: Jerrell Belfast, MD;  Location: [place];  Service: ENT;  Laterality: N/A;   Gastrostomy Tube removed   2013   GASTROSTOMY W/ FEEDING TUBE  13   HERNIA REPAIR     child   LARYNGETOMY  08/31/2012   Procedure: LARYNGECTOMY;  Surgeon: Jerrell Belfast, MD;  Location: [place];  Service: ENT;  Laterality: N/A;   LARYNGOSCOPY  08/10/2012   Procedure: LARYNGOSCOPY;  Surgeon: Jerrell Belfast, MD;  Location: [facility];  Service: ENT;  Laterality: N/A;  with biopsy   RADICAL NECK DISSECTION  08/31/2012   Procedure: RADICAL NECK DISSECTION;  Surgeon: Jerrell Belfast, MD;  Location: [place];  Service: ENT;  Laterality: Bilateral;   TRACHEAL ESOPHOGEAL PUNCTURE WITH REPAIR STOMA N/A 09/08/2013    Procedure: TRACHEAL ESOPHOGEAL PUNCTURE WITH PLACEMENT OF  [place] ;  Surgeon: Jerrell Belfast, MD;  Location: [place];  Service: ENT;  Laterality: N/A;   TRACHEOSTOMY TUBE PLACEMENT  08/10/2012   Procedure: TRACHEOSTOMY;  Surgeon: Jerrell Belfast, MD;  Location: [facility];  Service: ENT;  Laterality: N/A;     Current Outpatient Medications:    benzonatate (TESSALON PERLES) 100 MG capsule, 1-2 capsules up to twice daily as needed for cough, Disp: 30 capsule, Rfl: 0   doxycycline (VIBRA-TABS) 100 MG tablet, Take 1 tablet (100 mg total) by mouth 2 (two) times daily., Disp: 14 tablet, Rfl: 0   Baclofen 5 MG TABS, TAKE 5 MG BY MOUTH 2 (TWO) TIMES DAILY AS NEEDED., Disp: 30 tablet, Rfl: 0   cilostazol (PLETAL) 100 MG tablet, Take 1 tablet (100 mg total) by mouth 2 (two) times daily., Disp: 180 tablet, Rfl: 3   diphenoxylate-atropine (LOMOTIL) 2.5-0.025 MG tablet, Take 1 tablet by mouth 4 (four) times daily as needed for diarrhea or loose stools., Disp: 75 tablet, Rfl: 1   DULoxetine (CYMBALTA) 20 MG capsule, Take 1 capsule (20 mg total) by mouth daily., Disp: 90 capsule, Rfl: 3   Ensure (ENSURE), Take 237 mLs by mouth 2 (two) times daily., Disp: , Rfl:    famotidine (PEPCID) 40 MG tablet, TAKE 1 TABLET BY MOUTH EVERY DAY, Disp: 90 tablet, Rfl: 1   fluticasone (FLONASE) 50 MCG/ACT nasal spray, SPRAY 2 SPRAYS INTO EACH NOSTRIL EVERY DAY, Disp: 48 mL, Rfl: 2   hydrALAZINE (APRESOLINE) 50 MG tablet, TAKE 1.5 TABLETS (75 MG TOTAL) BY MOUTH 3 (THREE) TIMES DAILY., Disp: 405 tablet, Rfl: 1   isosorbide mononitrate (IMDUR) 60 MG 24 hr tablet, Take 1 tablet (60 mg total) by mouth daily., Disp: 90 tablet, Rfl: 3   lactose free nutrition (BOOST) LIQD, Take 237 mLs by mouth 2 (two) times daily between meals., Disp: , Rfl:    levothyroxine (SYNTHROID) 50 MCG tablet, TAKE 1 TABLET BY MOUTH DAILY BEFORE BREAKFAST, Disp: 90 tablet, Rfl: 3   loperamide (IMODIUM A-D) 2 MG tablet, Take 2 mg by mouth 4 (four) times  daily as needed for diarrhea or loose stools., Disp: , Rfl:    LORazepam (ATIVAN) 1 MG tablet, TAKE 1 TABLET BY MOUTH TWICE A DAY, Disp: 60 tablet, Rfl: 5   metoprolol tartrate (LOPRESSOR) 50 MG tablet, Take 1 tablet (50 mg total) by mouth 2 (two) times daily., Disp: 180 tablet, Rfl: 3   Olmesartan-amLODIPine-HCTZ 20-5-12.5 MG TABS, Take 1 tablet by mouth daily., Disp: 90 tablet, Rfl: 3   ondansetron (ZOFRAN ODT) 8 MG disintegrating tablet, Take 1 tablet (8 mg total) by mouth every 8 (eight) hours as needed for nausea or vomiting., Disp: 30 tablet, Rfl: 0   predniSONE (DELTASONE) 20 MG tablet, Take 2 tablets daily with breakfast., Disp: 10 tablet, Rfl: 0   promethazine (PHENERGAN) 25 MG tablet, TAKE 1 TABLET BY MOUTH EVERY 8 HOURS AS NEEDED FOR NAUSEA OR VOMITING., Disp: 60 tablet, Rfl: 3   rosuvastatin (CRESTOR) 20 MG tablet, Take 1 tablet (20  mg total) by mouth daily., Disp: 90 tablet, Rfl: 3   thiamine (VITAMIN B-1) 100 MG tablet, Take 1 tablet (100 mg total) by mouth daily., Disp: 90 tablet, Rfl: 1  EXAM:  VITALS per patient if applicable:  GENERAL: alert, oriented, appears well and in no acute distress  HEENT: atraumatic, conjunttiva clear, no obvious abnormalities on inspection of external nose and ears  NECK: normal movements of the head and neck  LUNGS: on inspection no signs of respiratory distress, breathing rate appears normal, no obvious gross SOB, gasping or wheezing  CV: no obvious cyanosis  MS: moves all visible extremities without noticeable abnormality  PSYCH/NEURO: pleasant and cooperative, no obvious depression or anxiety, speech and thought processing grossly intact  ASSESSMENT AND PLAN:  Discussed the following assessment and plan:  Cough, unspecified type  -we discussed possible serious and likely etiologies, options for evaluation and workup, limitations of telemedicine visit vs in person visit, treatment, treatment risks and precautions. Pt is agreeable to  treatment via telemedicine at this moment. Given duration of symptoms with worsening and thick sputum they opted for trial empiric doxy 100mg  bid x 7 days, refill on cough rx and the agree to covid test given high levels in community currently. They have agreed to [facility] if positive test and wish to pursue antiviral.  Advised to seek prompt virtual visit or in person care if worsening, new symptoms arise, or if is not improving with treatment as expected per our conversation of expected course. Discussed options for follow up care. Did let this patient know that I do telemedicine on Tuesdays and Thursdays for [place] and those are the days I am logged into the system. Advised to schedule follow up visit with PCP, [place] virtual visits or [facility] if any further questions or concerns to avoid delays in care.   I discussed the assessment and treatment plan with the patient. The patient was provided an opportunity to ask questions and all were answered. The patient agreed with the plan and demonstrated an understanding of the instructions.     Ann Kern, DO

## 2021-12-23 NOTE — Patient Instructions (Signed)
-  I sent the medication(s) we discussed to your pharmacy: Meds ordered this encounter  Medications   doxycycline (VIBRA-TABS) 100 MG tablet    Sig: Take 1 tablet (100 mg total) by mouth 2 (two) times daily.    Dispense:  14 tablet    Refill:  0   benzonatate (TESSALON PERLES) 100 MG capsule    Sig: 1-2 capsules up to twice daily as needed for cough    Dispense:  30 capsule    Refill:  0   Check for covid. If positive test schedule follow up visit or ontact [facility] [place] pharmacy if you wish to consider antiviral therapy and feel symptoms started or worsened < 5 days ago.   I hope you are feeling better soon!  Seek in person care promptly if your symptoms worsen, new concerns arise or you are not improving with treatment.  It was nice to meet you today. I help [facility] out with telemedicine visits on Tuesdays and Thursdays and am happy to help if you need a virtual follow up visit on those days. Otherwise, if you have any concerns or questions following this visit please schedule a follow up visit with your Primary Care office or seek care at a local urgent care clinic to avoid delays in care

## 2021-12-25 ENCOUNTER — Ambulatory Visit: Payer: Medicare Other

## 2021-12-26 ENCOUNTER — Telehealth: Payer: Self-pay | Admitting: Internal Medicine

## 2021-12-26 DIAGNOSIS — R053 Chronic cough: Secondary | ICD-10-CM

## 2021-12-26 NOTE — Telephone Encounter (Signed)
See below

## 2021-12-26 NOTE — Telephone Encounter (Signed)
Pt did a COVID test 12/23/20 and the test was Neg.   Pt is agreeing to have CXR completed and would like to know when it can be done.

## 2021-12-26 NOTE — Telephone Encounter (Signed)
Order placed

## 2021-12-26 NOTE — Addendum Note (Signed)
Addended by: Pricilla Holm A on: 12/26/2021 04:47 PM   Modules accepted: Orders

## 2021-12-26 NOTE — Telephone Encounter (Signed)
Called [facility]. LDVM at [phone] with Dr. Nathanial Millman recommendation. Office number was provided.  If [name] calls back please find out if pt was tested for covid? What were the results? And does she want to do a chest xray?

## 2021-12-26 NOTE — Telephone Encounter (Signed)
Did they do covid-19 test, if so positive or negative? I would recommend CXR and if negative hold antibiotics, if pneumonia we will change. Are they agreeable to CXR?

## 2021-12-26 NOTE — Telephone Encounter (Signed)
Patient's daughter in-law Janett Billow states Doxycycline Hyclate prescribed on 12-23-2021 is causing patient to vomit  Caller is requesting a rx for another antibiotic that patient can tolerate  Please advise

## 2021-12-27 NOTE — Telephone Encounter (Signed)
Pt ready for scheduling on or after 12/14/21  Out-of-pocket cost due at time of visit: $0.00  Primary: Medicare [place] co-insurance: 0% Admin fee co-insurance: 0%  Secondary: Medicaid [place] co-insurance: 0% Admin fee co-insurance: 0%  Deductible: does not apply  Prior Auth: not required PA# Valid:     ** This summary of benefits is an estimation of the patient's out-of-pocket cost. Exact cost may very based on individual plan coverage.

## 2021-12-30 ENCOUNTER — Encounter: Payer: Self-pay | Admitting: Internal Medicine

## 2021-12-30 ENCOUNTER — Ambulatory Visit (INDEPENDENT_AMBULATORY_CARE_PROVIDER_SITE_OTHER): Payer: Medicare Other

## 2021-12-30 ENCOUNTER — Other Ambulatory Visit: Payer: Self-pay

## 2021-12-30 ENCOUNTER — Other Ambulatory Visit: Payer: Self-pay | Admitting: Internal Medicine

## 2021-12-30 ENCOUNTER — Other Ambulatory Visit: Payer: Medicare Other

## 2021-12-30 ENCOUNTER — Ambulatory Visit (INDEPENDENT_AMBULATORY_CARE_PROVIDER_SITE_OTHER): Payer: Medicare Other | Admitting: Internal Medicine

## 2021-12-30 DIAGNOSIS — M542 Cervicalgia: Secondary | ICD-10-CM | POA: Diagnosis not present

## 2021-12-30 DIAGNOSIS — R059 Cough, unspecified: Secondary | ICD-10-CM | POA: Diagnosis not present

## 2021-12-30 DIAGNOSIS — R053 Chronic cough: Secondary | ICD-10-CM

## 2021-12-30 DIAGNOSIS — R052 Subacute cough: Secondary | ICD-10-CM | POA: Diagnosis not present

## 2021-12-30 DIAGNOSIS — M4802 Spinal stenosis, cervical region: Secondary | ICD-10-CM | POA: Diagnosis not present

## 2021-12-30 DIAGNOSIS — M47812 Spondylosis without myelopathy or radiculopathy, cervical region: Secondary | ICD-10-CM | POA: Diagnosis not present

## 2021-12-30 MED ORDER — BACLOFEN 5 MG PO TABS: 5.0000 mg | ORAL_TABLET | Freq: Two times a day (BID) | ORAL | 0 refills | Status: DC | PRN

## 2021-12-30 MED ORDER — ONDANSETRON HCL 4 MG PO TABS: 4.0000 mg | ORAL_TABLET | Freq: Three times a day (TID) | ORAL | 0 refills | Status: DC | PRN

## 2021-12-30 NOTE — Telephone Encounter (Signed)
I cannot advise further until CXR returns but I would recommend to stop all antibiotics.

## 2021-12-30 NOTE — Patient Instructions (Signed)
Echinacea and vitamin C help to boost the immune system.  We have sent in zofran for nausea. We will get the x-ray.  We have sent in baclofen for the neck pain/headache to take if needed.

## 2021-12-30 NOTE — Telephone Encounter (Addendum)
Pt daughter called in stating when pt started ABX she started having Diarrhea. Now pt is very unsteady having to walk with assistance to keep from falling over and hands very shaky.  Pt stopped taking ABX yesterday 12/29/21 with 2 days remaining.  Pt will be going to do CXR today at 4:00.  Janett Billow CB: [phone]

## 2021-12-30 NOTE — Telephone Encounter (Signed)
Fyi.

## 2021-12-31 ENCOUNTER — Ambulatory Visit: Payer: Medicare Other

## 2022-01-01 ENCOUNTER — Encounter (HOSPITAL_COMMUNITY): Payer: Self-pay

## 2022-01-01 ENCOUNTER — Inpatient Hospital Stay (HOSPITAL_COMMUNITY)
Admission: EM | Admit: 2022-01-01 | Discharge: 2022-01-08 | DRG: 305 | Disposition: A | Discharge status: Home Health | Payer: Medicare Other | Attending: Internal Medicine | Admitting: Internal Medicine

## 2022-01-01 ENCOUNTER — Other Ambulatory Visit: Payer: Self-pay

## 2022-01-01 DIAGNOSIS — J449 Chronic obstructive pulmonary disease, unspecified: Secondary | ICD-10-CM | POA: Diagnosis not present

## 2022-01-01 DIAGNOSIS — K219 Gastro-esophageal reflux disease without esophagitis: Secondary | ICD-10-CM | POA: Diagnosis present

## 2022-01-01 DIAGNOSIS — D649 Anemia, unspecified: Secondary | ICD-10-CM | POA: Diagnosis present

## 2022-01-01 DIAGNOSIS — Z7952 Long term (current) use of systemic steroids: Secondary | ICD-10-CM

## 2022-01-01 DIAGNOSIS — E7800 Pure hypercholesterolemia, unspecified: Secondary | ICD-10-CM | POA: Diagnosis present

## 2022-01-01 DIAGNOSIS — R4182 Altered mental status, unspecified: Secondary | ICD-10-CM | POA: Diagnosis not present

## 2022-01-01 DIAGNOSIS — R778 Other specified abnormalities of plasma proteins: Secondary | ICD-10-CM | POA: Diagnosis present

## 2022-01-01 DIAGNOSIS — I480 Paroxysmal atrial fibrillation: Secondary | ICD-10-CM | POA: Diagnosis not present

## 2022-01-01 DIAGNOSIS — R7989 Other specified abnormal findings of blood chemistry: Secondary | ICD-10-CM | POA: Diagnosis not present

## 2022-01-01 DIAGNOSIS — R000 Tachycardia, unspecified: Secondary | ICD-10-CM | POA: Diagnosis not present

## 2022-01-01 DIAGNOSIS — Z8673 Personal history of transient ischemic attack (TIA), and cerebral infarction without residual deficits: Secondary | ICD-10-CM | POA: Diagnosis not present

## 2022-01-01 DIAGNOSIS — Z880 Allergy status to penicillin: Secondary | ICD-10-CM

## 2022-01-01 DIAGNOSIS — I161 Hypertensive emergency: Secondary | ICD-10-CM | POA: Diagnosis not present

## 2022-01-01 DIAGNOSIS — Z930 Tracheostomy status: Secondary | ICD-10-CM | POA: Diagnosis not present

## 2022-01-01 DIAGNOSIS — I739 Peripheral vascular disease, unspecified: Secondary | ICD-10-CM | POA: Diagnosis not present

## 2022-01-01 DIAGNOSIS — R519 Headache, unspecified: Secondary | ICD-10-CM | POA: Diagnosis present

## 2022-01-01 DIAGNOSIS — Z20822 Contact with and (suspected) exposure to covid-19: Secondary | ICD-10-CM | POA: Diagnosis present

## 2022-01-01 DIAGNOSIS — Z931 Gastrostomy status: Secondary | ICD-10-CM

## 2022-01-01 DIAGNOSIS — R251 Tremor, unspecified: Secondary | ICD-10-CM | POA: Diagnosis present

## 2022-01-01 DIAGNOSIS — F411 Generalized anxiety disorder: Secondary | ICD-10-CM | POA: Diagnosis present

## 2022-01-01 DIAGNOSIS — Z923 Personal history of irradiation: Secondary | ICD-10-CM

## 2022-01-01 DIAGNOSIS — I10 Essential (primary) hypertension: Secondary | ICD-10-CM | POA: Diagnosis not present

## 2022-01-01 DIAGNOSIS — M549 Dorsalgia, unspecified: Secondary | ICD-10-CM | POA: Diagnosis present

## 2022-01-01 DIAGNOSIS — Z79899 Other long term (current) drug therapy: Secondary | ICD-10-CM

## 2022-01-01 DIAGNOSIS — N189 Chronic kidney disease, unspecified: Secondary | ICD-10-CM | POA: Diagnosis present

## 2022-01-01 DIAGNOSIS — Z800 Family history of malignant neoplasm of digestive organs: Secondary | ICD-10-CM

## 2022-01-01 DIAGNOSIS — Z8521 Personal history of malignant neoplasm of larynx: Secondary | ICD-10-CM | POA: Diagnosis not present

## 2022-01-01 DIAGNOSIS — Z9889 Other specified postprocedural states: Secondary | ICD-10-CM | POA: Diagnosis not present

## 2022-01-01 DIAGNOSIS — F32A Depression, unspecified: Secondary | ICD-10-CM | POA: Diagnosis present

## 2022-01-01 DIAGNOSIS — I248 Other forms of acute ischemic heart disease: Secondary | ICD-10-CM | POA: Diagnosis present

## 2022-01-01 DIAGNOSIS — Z8542 Personal history of malignant neoplasm of other parts of uterus: Secondary | ICD-10-CM | POA: Diagnosis not present

## 2022-01-01 DIAGNOSIS — N179 Acute kidney failure, unspecified: Secondary | ICD-10-CM | POA: Diagnosis present

## 2022-01-01 DIAGNOSIS — G4489 Other headache syndrome: Secondary | ICD-10-CM | POA: Diagnosis not present

## 2022-01-01 DIAGNOSIS — I701 Atherosclerosis of renal artery: Secondary | ICD-10-CM | POA: Diagnosis present

## 2022-01-01 DIAGNOSIS — E039 Hypothyroidism, unspecified: Secondary | ICD-10-CM | POA: Diagnosis present

## 2022-01-01 DIAGNOSIS — N184 Chronic kidney disease, stage 4 (severe): Secondary | ICD-10-CM | POA: Diagnosis present

## 2022-01-01 DIAGNOSIS — D631 Anemia in chronic kidney disease: Secondary | ICD-10-CM | POA: Diagnosis not present

## 2022-01-01 DIAGNOSIS — Z9002 Acquired absence of larynx: Secondary | ICD-10-CM | POA: Diagnosis not present

## 2022-01-01 DIAGNOSIS — Z86718 Personal history of other venous thrombosis and embolism: Secondary | ICD-10-CM

## 2022-01-01 DIAGNOSIS — I700 Atherosclerosis of aorta: Secondary | ICD-10-CM | POA: Diagnosis not present

## 2022-01-01 DIAGNOSIS — I2510 Atherosclerotic heart disease of native coronary artery without angina pectoris: Secondary | ICD-10-CM | POA: Diagnosis present

## 2022-01-01 DIAGNOSIS — G4700 Insomnia, unspecified: Secondary | ICD-10-CM | POA: Diagnosis present

## 2022-01-01 DIAGNOSIS — I252 Old myocardial infarction: Secondary | ICD-10-CM | POA: Diagnosis not present

## 2022-01-01 DIAGNOSIS — Z743 Need for continuous supervision: Secondary | ICD-10-CM | POA: Diagnosis not present

## 2022-01-01 DIAGNOSIS — I129 Hypertensive chronic kidney disease with stage 1 through stage 4 chronic kidney disease, or unspecified chronic kidney disease: Secondary | ICD-10-CM | POA: Diagnosis present

## 2022-01-01 DIAGNOSIS — Z9221 Personal history of antineoplastic chemotherapy: Secondary | ICD-10-CM

## 2022-01-01 DIAGNOSIS — Z87891 Personal history of nicotine dependence: Secondary | ICD-10-CM

## 2022-01-01 DIAGNOSIS — I160 Hypertensive urgency: Secondary | ICD-10-CM | POA: Diagnosis not present

## 2022-01-01 DIAGNOSIS — I219 Acute myocardial infarction, unspecified: Secondary | ICD-10-CM | POA: Diagnosis not present

## 2022-01-01 DIAGNOSIS — I959 Hypotension, unspecified: Secondary | ICD-10-CM | POA: Diagnosis not present

## 2022-01-01 DIAGNOSIS — R442 Other hallucinations: Secondary | ICD-10-CM | POA: Diagnosis not present

## 2022-01-01 DIAGNOSIS — Z8701 Personal history of pneumonia (recurrent): Secondary | ICD-10-CM

## 2022-01-01 DIAGNOSIS — Z888 Allergy status to other drugs, medicaments and biological substances status: Secondary | ICD-10-CM

## 2022-01-01 DIAGNOSIS — R52 Pain, unspecified: Secondary | ICD-10-CM | POA: Diagnosis not present

## 2022-01-01 DIAGNOSIS — G8929 Other chronic pain: Secondary | ICD-10-CM | POA: Diagnosis present

## 2022-01-01 DIAGNOSIS — N185 Chronic kidney disease, stage 5: Secondary | ICD-10-CM | POA: Diagnosis present

## 2022-01-01 NOTE — ED Triage Notes (Signed)
Pt states that she has had a headache since Tuesday. Noted Hypertension 247/114. NIH negative. Pt noted to have periorbital edema.

## 2022-01-02 ENCOUNTER — Other Ambulatory Visit (HOSPITAL_COMMUNITY): Payer: Self-pay

## 2022-01-02 ENCOUNTER — Emergency Department (HOSPITAL_COMMUNITY): Payer: Medicare Other

## 2022-01-02 ENCOUNTER — Observation Stay (HOSPITAL_COMMUNITY): Payer: Medicare Other

## 2022-01-02 ENCOUNTER — Encounter: Payer: Self-pay | Admitting: Internal Medicine

## 2022-01-02 DIAGNOSIS — Z9889 Other specified postprocedural states: Secondary | ICD-10-CM | POA: Diagnosis not present

## 2022-01-02 DIAGNOSIS — I10 Essential (primary) hypertension: Secondary | ICD-10-CM | POA: Diagnosis not present

## 2022-01-02 DIAGNOSIS — R4182 Altered mental status, unspecified: Secondary | ICD-10-CM | POA: Diagnosis not present

## 2022-01-02 DIAGNOSIS — Z9002 Acquired absence of larynx: Secondary | ICD-10-CM | POA: Diagnosis not present

## 2022-01-02 DIAGNOSIS — I219 Acute myocardial infarction, unspecified: Secondary | ICD-10-CM

## 2022-01-02 DIAGNOSIS — I161 Hypertensive emergency: Secondary | ICD-10-CM

## 2022-01-02 DIAGNOSIS — R7989 Other specified abnormal findings of blood chemistry: Secondary | ICD-10-CM | POA: Diagnosis not present

## 2022-01-02 DIAGNOSIS — J449 Chronic obstructive pulmonary disease, unspecified: Secondary | ICD-10-CM | POA: Diagnosis not present

## 2022-01-02 DIAGNOSIS — R778 Other specified abnormalities of plasma proteins: Secondary | ICD-10-CM | POA: Diagnosis not present

## 2022-01-02 DIAGNOSIS — I480 Paroxysmal atrial fibrillation: Secondary | ICD-10-CM | POA: Diagnosis present

## 2022-01-02 DIAGNOSIS — R059 Cough, unspecified: Secondary | ICD-10-CM | POA: Insufficient documentation

## 2022-01-02 DIAGNOSIS — I700 Atherosclerosis of aorta: Secondary | ICD-10-CM | POA: Diagnosis not present

## 2022-01-02 DIAGNOSIS — I129 Hypertensive chronic kidney disease with stage 1 through stage 4 chronic kidney disease, or unspecified chronic kidney disease: Secondary | ICD-10-CM | POA: Diagnosis not present

## 2022-01-02 DIAGNOSIS — N184 Chronic kidney disease, stage 4 (severe): Secondary | ICD-10-CM | POA: Diagnosis not present

## 2022-01-02 LAB — COMPREHENSIVE METABOLIC PANEL
ALT: 14 U/L (ref 0–44)
ALT: 13 U/L (ref 0–44)
AST: 34 U/L (ref 15–41)
AST: 19 U/L (ref 15–41)
Albumin: 3.1 g/dL — ABNORMAL LOW (ref 3.5–5.0)
Albumin: 2.8 g/dL — ABNORMAL LOW (ref 3.5–5.0)
Alkaline Phosphatase: 104 U/L (ref 38–126)
Alkaline Phosphatase: 88 U/L (ref 38–126)
Anion gap: 11 (ref 5–15)
Anion gap: 11 (ref 5–15)
BUN: 66 mg/dL — ABNORMAL HIGH (ref 8–23)
BUN: 61 mg/dL — ABNORMAL HIGH (ref 8–23)
CO2: 23 mmol/L (ref 22–32)
CO2: 19 mmol/L — ABNORMAL LOW (ref 22–32)
Calcium: 9.1 mg/dL (ref 8.9–10.3)
Calcium: 8.9 mg/dL (ref 8.9–10.3)
Chloride: 106 mmol/L (ref 98–111)
Chloride: 109 mmol/L (ref 98–111)
Creatinine, Ser: 2.7 mg/dL — ABNORMAL HIGH (ref 0.44–1.00)
Creatinine, Ser: 2.67 mg/dL — ABNORMAL HIGH (ref 0.44–1.00)
GFR, Estimated: 18 mL/min — ABNORMAL LOW (ref >60)
GFR, Estimated: 18 mL/min — ABNORMAL LOW (ref >60)
Glucose, Bld: 116 mg/dL — ABNORMAL HIGH (ref 70–99)
Glucose, Bld: 110 mg/dL — ABNORMAL HIGH (ref 70–99)
Potassium: 5 mmol/L (ref 3.5–5.1)
Potassium: 4.4 mmol/L (ref 3.5–5.1)
Sodium: 140 mmol/L (ref 135–145)
Sodium: 139 mmol/L (ref 135–145)
Total Bilirubin: 0.9 mg/dL (ref 0.3–1.2)
Total Bilirubin: 0.2 mg/dL — ABNORMAL LOW (ref 0.3–1.2)
Total Protein: 5.9 g/dL — ABNORMAL LOW (ref 6.5–8.1)
Total Protein: 5.3 g/dL — ABNORMAL LOW (ref 6.5–8.1)

## 2022-01-02 LAB — BRAIN NATRIURETIC PEPTIDE: B Natriuretic Peptide: 49.3 pg/mL (ref 0.0–100.0)

## 2022-01-02 LAB — CBC WITH DIFFERENTIAL/PLATELET
Abs Immature Granulocytes: 0.16 10*3/uL — ABNORMAL HIGH (ref 0.00–0.07)
Abs Immature Granulocytes: 0.14 10*3/uL — ABNORMAL HIGH (ref 0.00–0.07)
Basophils Absolute: 0.1 10*3/uL (ref 0.0–0.1)
Basophils Absolute: 0.1 10*3/uL (ref 0.0–0.1)
Basophils Relative: 1 %
Basophils Relative: 1 %
Eosinophils Absolute: 0.1 10*3/uL (ref 0.0–0.5)
Eosinophils Absolute: 0.1 10*3/uL (ref 0.0–0.5)
Eosinophils Relative: 1 %
Eosinophils Relative: 1 %
HCT: 32.1 % — ABNORMAL LOW (ref 36.0–46.0)
HCT: 29.9 % — ABNORMAL LOW (ref 36.0–46.0)
Hemoglobin: 10.5 g/dL — ABNORMAL LOW (ref 12.0–15.0)
Hemoglobin: 9.7 g/dL — ABNORMAL LOW (ref 12.0–15.0)
Immature Granulocytes: 2 %
Immature Granulocytes: 2 %
Lymphocytes Relative: 18 %
Lymphocytes Relative: 23 %
Lymphs Abs: 2 10*3/uL (ref 0.7–4.0)
Lymphs Abs: 2.2 10*3/uL (ref 0.7–4.0)
MCH: 28.6 pg (ref 26.0–34.0)
MCH: 28.9 pg (ref 26.0–34.0)
MCHC: 32.7 g/dL (ref 30.0–36.0)
MCHC: 32.4 g/dL (ref 30.0–36.0)
MCV: 87.5 fL (ref 80.0–100.0)
MCV: 89 fL (ref 80.0–100.0)
Monocytes Absolute: 0.9 10*3/uL (ref 0.1–1.0)
Monocytes Absolute: 0.7 10*3/uL (ref 0.1–1.0)
Monocytes Relative: 8 %
Monocytes Relative: 8 %
Neutro Abs: 7.4 10*3/uL (ref 1.7–7.7)
Neutro Abs: 6.1 10*3/uL (ref 1.7–7.7)
Neutrophils Relative %: 70 %
Neutrophils Relative %: 65 %
Platelets: 221 10*3/uL (ref 150–400)
Platelets: 180 10*3/uL (ref 150–400)
RBC: 3.67 MIL/uL — ABNORMAL LOW (ref 3.87–5.11)
RBC: 3.36 MIL/uL — ABNORMAL LOW (ref 3.87–5.11)
RDW: 13.8 % (ref 11.5–15.5)
RDW: 13.7 % (ref 11.5–15.5)
WBC: 10.6 10*3/uL — ABNORMAL HIGH (ref 4.0–10.5)
WBC: 9.3 10*3/uL (ref 4.0–10.5)
nRBC: 0 % (ref 0.0–0.2)
nRBC: 0 % (ref 0.0–0.2)

## 2022-01-02 LAB — ECHOCARDIOGRAM COMPLETE
Area-P 1/2: 2.86 cm2
Height: 64 in
S' Lateral: 1.9 cm
Weight: 1920 oz

## 2022-01-02 LAB — TROPONIN I (HIGH SENSITIVITY)
Troponin I (High Sensitivity): 1457 ng/L (ref <18)
Troponin I (High Sensitivity): 1357 ng/L (ref <18)

## 2022-01-02 LAB — RESP PANEL BY RT-PCR (FLU A&B, COVID) ARPGX2
Influenza A by PCR: NEGATIVE
Influenza B by PCR: NEGATIVE
SARS Coronavirus 2 by RT PCR: NEGATIVE

## 2022-01-02 LAB — MRSA NEXT GEN BY PCR, NASAL: MRSA by PCR Next Gen: NOT DETECTED

## 2022-01-02 LAB — MAGNESIUM: Magnesium: 1.7 mg/dL (ref 1.7–2.4)

## 2022-01-02 LAB — HEPARIN LEVEL (UNFRACTIONATED): Heparin Unfractionated: 0.4 IU/mL (ref 0.30–0.70)

## 2022-01-02 LAB — TSH: TSH: 3.283 u[IU]/mL (ref 0.350–4.500)

## 2022-01-02 MED ORDER — ACETAMINOPHEN 650 MG RE SUPP: 650.0000 mg | Freq: Four times a day (QID) | RECTAL | Status: DC | PRN

## 2022-01-02 MED ORDER — ROSUVASTATIN CALCIUM 20 MG PO TABS
20.0000 mg | ORAL_TABLET | Freq: Every day | ORAL | Status: DC
  Administered 2022-01-03 – 2022-01-08 (×6): 20 mg via ORAL
  Filled 2022-01-02 (×7): qty 1

## 2022-01-02 MED ORDER — MIRTAZAPINE 15 MG PO TABS
15.0000 mg | ORAL_TABLET | Freq: Every day | ORAL | Status: DC
  Administered 2022-01-02 – 2022-01-07 (×6): 15 mg via ORAL
  Filled 2022-01-02 (×6): qty 1

## 2022-01-02 MED ORDER — HEPARIN (PORCINE) 25000 UT/250ML-% IV SOLN
800.0000 [IU]/h | INTRAVENOUS | Status: DC
  Administered 2022-01-02 – 2022-01-03 (×2): 800 [IU]/h via INTRAVENOUS
  Filled 2022-01-02 (×2): qty 250

## 2022-01-02 MED ORDER — SODIUM CHLORIDE 0.9 % IV SOLN: INTRAVENOUS | Status: DC

## 2022-01-02 MED ORDER — ROSUVASTATIN CALCIUM 20 MG PO TABS: 40.0000 mg | ORAL_TABLET | Freq: Every day | ORAL | Status: DC

## 2022-01-02 MED ORDER — HEPARIN BOLUS VIA INFUSION
2000.0000 [IU] | Freq: Once | INTRAVENOUS | Status: AC
  Administered 2022-01-02: 2000 [IU] via INTRAVENOUS
  Filled 2022-01-02: qty 2000

## 2022-01-02 MED ORDER — LABETALOL HCL 5 MG/ML IV SOLN
20.0000 mg | Freq: Once | INTRAVENOUS | Status: AC
  Administered 2022-01-02: 20 mg via INTRAVENOUS
  Filled 2022-01-02: qty 4

## 2022-01-02 MED ORDER — LEVOTHYROXINE SODIUM 25 MCG PO TABS
50.0000 ug | ORAL_TABLET | Freq: Every day | ORAL | Status: DC
  Administered 2022-01-02 – 2022-01-08 (×7): 50 ug via ORAL
  Filled 2022-01-02 (×4): qty 2
  Filled 2022-01-02: qty 1
  Filled 2022-01-02 (×2): qty 2

## 2022-01-02 MED ORDER — ONDANSETRON HCL 4 MG/2ML IJ SOLN
4.0000 mg | Freq: Once | INTRAMUSCULAR | Status: AC
  Administered 2022-01-02: 4 mg via INTRAVENOUS
  Filled 2022-01-02: qty 2

## 2022-01-02 MED ORDER — CARVEDILOL 12.5 MG PO TABS
12.5000 mg | ORAL_TABLET | Freq: Two times a day (BID) | ORAL | Status: DC
  Administered 2022-01-02 – 2022-01-05 (×5): 12.5 mg via ORAL
  Filled 2022-01-02 (×5): qty 1

## 2022-01-02 MED ORDER — CARVEDILOL 6.25 MG PO TABS
6.2500 mg | ORAL_TABLET | Freq: Two times a day (BID) | ORAL | Status: DC
  Administered 2022-01-02: 6.25 mg via ORAL
  Filled 2022-01-02: qty 2

## 2022-01-02 MED ORDER — CLONIDINE HCL 0.1 MG PO TABS: 0.1000 mg | ORAL_TABLET | Freq: Once | ORAL | Status: DC

## 2022-01-02 MED ORDER — DULOXETINE HCL 20 MG PO CPEP
20.0000 mg | ORAL_CAPSULE | Freq: Every day | ORAL | Status: DC
  Administered 2022-01-02 – 2022-01-08 (×7): 20 mg via ORAL
  Filled 2022-01-02 (×8): qty 1

## 2022-01-02 MED ORDER — HYDRALAZINE HCL 20 MG/ML IJ SOLN
10.0000 mg | Freq: Four times a day (QID) | INTRAMUSCULAR | Status: DC | PRN
  Administered 2022-01-02 – 2022-01-03 (×4): 10 mg via INTRAVENOUS
  Filled 2022-01-02 (×4): qty 1

## 2022-01-02 MED ORDER — ASPIRIN EC 81 MG PO TBEC
81.0000 mg | DELAYED_RELEASE_TABLET | Freq: Every day | ORAL | Status: DC
  Administered 2022-01-02 – 2022-01-03 (×2): 81 mg via ORAL
  Filled 2022-01-02 (×2): qty 1

## 2022-01-02 MED ORDER — HYDRALAZINE HCL 25 MG PO TABS
100.0000 mg | ORAL_TABLET | ORAL | Status: AC
  Administered 2022-01-02: 100 mg via ORAL
  Filled 2022-01-02: qty 4

## 2022-01-02 MED ORDER — HYDRALAZINE HCL 50 MG PO TABS
50.0000 mg | ORAL_TABLET | Freq: Three times a day (TID) | ORAL | Status: DC
  Administered 2022-01-02 – 2022-01-03 (×2): 50 mg via ORAL
  Filled 2022-01-02 (×2): qty 1

## 2022-01-02 MED ORDER — AMLODIPINE BESYLATE 5 MG PO TABS
5.0000 mg | ORAL_TABLET | Freq: Every day | ORAL | Status: DC
  Administered 2022-01-02 – 2022-01-03 (×2): 5 mg via ORAL
  Filled 2022-01-02 (×2): qty 1

## 2022-01-02 MED ORDER — ASPIRIN 81 MG PO CHEW
324.0000 mg | CHEWABLE_TABLET | Freq: Once | ORAL | Status: AC
Start: 2022-01-02 — End: 2022-01-02
  Administered 2022-01-02: 324 mg via ORAL
  Filled 2022-01-02: qty 4

## 2022-01-02 MED ORDER — ACETAMINOPHEN 325 MG PO TABS
650.0000 mg | ORAL_TABLET | Freq: Four times a day (QID) | ORAL | Status: DC | PRN
  Administered 2022-01-03 – 2022-01-04 (×3): 650 mg via ORAL
  Filled 2022-01-02 (×6): qty 2

## 2022-01-02 MED ORDER — ONDANSETRON HCL 4 MG PO TABS
4.0000 mg | ORAL_TABLET | Freq: Four times a day (QID) | ORAL | Status: DC | PRN
  Administered 2022-01-02: 4 mg via ORAL
  Filled 2022-01-02 (×2): qty 1

## 2022-01-02 MED ORDER — ONDANSETRON HCL 4 MG/2ML IJ SOLN
4.0000 mg | Freq: Four times a day (QID) | INTRAMUSCULAR | Status: DC | PRN
  Administered 2022-01-03 – 2022-01-07 (×4): 4 mg via INTRAVENOUS
  Filled 2022-01-02 (×4): qty 2

## 2022-01-02 MED ORDER — MORPHINE SULFATE (PF) 2 MG/ML IV SOLN
2.0000 mg | Freq: Once | INTRAVENOUS | Status: AC
  Administered 2022-01-02: 2 mg via INTRAVENOUS
  Filled 2022-01-02: qty 1
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
## 2022-01-02 NOTE — H&P (Addendum)
History and Physical    [name] ZHG:[id number] DOB: May 14, 1946 DOA: 01/01/2022  PCP: Hoyt Koch, MD   Patient coming from: Home  I have personally briefly reviewed patient's old medical records in [place]  CC: periorbital edema, HTN HPI: 76 year old Hispanic female with a history of head neck cancer status post laryngectomy, history of CKD stage IV, history of hypertension who presents to the ER today with a overnight history of headache and periorbital edema.  Son states the patient's blood pressure is extremely elevated at home with systolic blood pressures greater than 250.  Patient brought to the ER for evaluation.  Patient unable to speak due to her prior laryngectomy.  History is obtained from the patient's son.  On arrival, blood pressure 256/95 satting 99% on room air.  Patient given 1 dose of IV labetalol which dropped her blood pressure from 201/90-120 5/102.  Patient also noted to have elevated troponins of 1457.  Cardiology was consulted.  Per verbal report from the EDP, cardiology performed a bedside echo which showed normal LV function.  Patient started on heparin.  Due to the patient's bleed hypotension and elevated troponin, [facility] hospitalist contacted for admission.   ED Course: hypertensive on arrival SBP >250. Iv labetalol 20 mg dropped SBP to 120. Troponin >1400. Cardiology consulted.  Review of Systems:  Review of Systems  Unable to perform ROS: Other  Pt unable to speak. Hx of laryngectomy  Past Medical History:  Diagnosis Date   Anemia    Anxiety    takes Ativan prn   Blood transfusion without reported diagnosis 09/15/12   2 units Prbc's   Broken ribs    Chronic back pain    CKD (chronic kidney disease), stage IV (HCC)    Constipation    related to pain meds   COPD (chronic obstructive pulmonary disease) ([place]) 08/10/2012   denies   Depression    Gastrostomy in place [facility])    removed   GERD (gastroesophageal reflux  disease)    takes Zantac daily   Headache(784.0)    Hiatal hernia 08/10/2012   History of radiation therapy 10/17/12-11/25/12   supraglottic larynx,high risk neck tumor bed 5880 cGy/28 sessions, high risk lymph node tumor bed 5600 cGy/20 sessions, mod risk lymph node tumor bed 5040 cGy/20 sessions   Hypercholesteremia    takes Pravastatin daily   Hypertension    takes Tribenzor and Atenolol daily   Insomnia    takes Amitriptyline daily   Nausea    takes Zofran prn   PAD (peripheral artery disease) ([place])    noninvasive imaging in 2016   Pneumonia    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 08/08/2012   Shortness of breath dyspnea    Stroke [facility]) 2011   denies. no residual   Uterine cancer ([place])     Past Surgical History:  Procedure Laterality Date   ABDOMINAL SURGERY     r/t uterine carcinoma   APPENDECTOMY     DIRECT LARYNGOSCOPY N/A 05/22/2014   Procedure: DIRECT LARYNGOSCOPY WITH ESOPHAGEAL DILATION;  Surgeon: Jerrell Belfast, MD;  Location: [facility] OR;  Service: ENT;  Laterality: N/A;   ESOPHAGOSCOPY WITH DILITATION N/A 05/29/2015   Procedure: ESOPHAGOSCOPY WITH ESOPHAGEAL DILITATION;  Surgeon: Jerrell Belfast, MD;  Location: [place];  Service: ENT;  Laterality: N/A;   Gastrostomy Tube removed   2013   GASTROSTOMY W/ FEEDING TUBE  13   HERNIA REPAIR     child   LARYNGETOMY  08/31/2012  Procedure: LARYNGECTOMY;  Surgeon: Jerrell Belfast, MD;  Location: [place];  Service: ENT;  Laterality: N/A;   LARYNGOSCOPY  08/10/2012   Procedure: LARYNGOSCOPY;  Surgeon: Jerrell Belfast, MD;  Location: [facility];  Service: ENT;  Laterality: N/A;  with biopsy   RADICAL NECK DISSECTION  08/31/2012   Procedure: RADICAL NECK DISSECTION;  Surgeon: Jerrell Belfast, MD;  Location: [place];  Service: ENT;  Laterality: Bilateral;   TRACHEAL ESOPHOGEAL PUNCTURE WITH REPAIR STOMA N/A 09/08/2013   Procedure: TRACHEAL ESOPHOGEAL PUNCTURE WITH PLACEMENT OF  [place] ;  Surgeon: Jerrell Belfast, MD;   Location: [place];  Service: ENT;  Laterality: N/A;   TRACHEOSTOMY TUBE PLACEMENT  08/10/2012   Procedure: TRACHEOSTOMY;  Surgeon: Jerrell Belfast, MD;  Location: [facility];  Service: ENT;  Laterality: N/A;     reports that she quit smoking about 8 years ago. Her smoking use included cigarettes. She has a 12.50 pack-year smoking history. She has never used smokeless tobacco. She reports current alcohol use. She reports that she does not use drugs.  Allergies  Allergen Reactions   Xyzal [Levocetirizine Dihydrochloride] Itching   Augmentin [Amoxicillin-Pot Clavulanate] Diarrhea and Nausea And Vomiting   Tribenzor [Olmesartan-Amlodipine-Hctz] Other (See Comments)    "Hurt the kidneys"    Family History  Problem Relation Age of Onset   Throat cancer Father    Brain cancer Brother    CAD Neg Hx     Prior to Admission medications   Medication Sig Start Date End Date Taking? Authorizing Provider  Baclofen 5 MG TABS Take 5 mg by mouth 2 (two) times daily as needed. 12/30/21   Hoyt Koch, MD  benzonatate (TESSALON PERLES) 100 MG capsule 1-2 capsules up to twice daily as needed for cough Patient not taking: Reported on 12/30/2021 12/23/21   Lucretia Kern, DO  cilostazol (PLETAL) 100 MG tablet Take 1 tablet (100 mg total) by mouth 2 (two) times daily. 04/07/21   Hoyt Koch, MD  diphenoxylate-atropine (LOMOTIL) 2.5-0.025 MG tablet Take 1 tablet by mouth 4 (four) times daily as needed for diarrhea or loose stools. 09/19/20   Janith Lima, MD  doxycycline (VIBRA-TABS) 100 MG tablet Take 1 tablet (100 mg total) by mouth 2 (two) times daily. Patient not taking: Reported on 12/30/2021 12/23/21   Lucretia Kern, DO  DULoxetine (CYMBALTA) 20 MG capsule Take 1 capsule (20 mg total) by mouth daily. 07/11/21   Hoyt Koch, MD  Ensure (ENSURE) Take 237 mLs by mouth 2 (two) times daily.    [provider]  famotidine (PEPCID) 40 MG tablet TAKE 1 TABLET BY MOUTH EVERY DAY 01/04/21    Janith Lima, MD  fluticasone [facility]) 50 MCG/ACT nasal spray SPRAY 2 SPRAYS INTO EACH NOSTRIL EVERY DAY 03/03/21   Janith Lima, MD  hydrALAZINE (APRESOLINE) 50 MG tablet TAKE 1.5 TABLETS (75 MG TOTAL) BY MOUTH 3 (THREE) TIMES DAILY. 09/05/21   Hoyt Koch, MD  isosorbide mononitrate (IMDUR) 60 MG 24 hr tablet Take 1 tablet (60 mg total) by mouth daily. 01/28/21   Imogene Burn, PA-C  lactose free nutrition (BOOST) LIQD Take 237 mLs by mouth 2 (two) times daily between meals.    [provider]  levothyroxine (SYNTHROID) 50 MCG tablet TAKE 1 TABLET BY MOUTH DAILY BEFORE BREAKFAST 03/14/21   Hoyt Koch, MD  loperamide (IMODIUM A-D) 2 MG tablet Take 2 mg by mouth 4 (four) times daily as needed for diarrhea or loose stools.  [provider]  LORazepam (ATIVAN) 1 MG tablet TAKE 1 TABLET BY MOUTH TWICE A DAY 11/28/21   Hoyt Koch, MD  metoprolol tartrate (LOPRESSOR) 50 MG tablet Take 1 tablet (50 mg total) by mouth 2 (two) times daily. 04/07/21   Hoyt Koch, MD  Olmesartan-amLODIPine-HCTZ 20-5-12.5 MG TABS Take 1 tablet by mouth daily. 04/07/21   Hoyt Koch, MD  ondansetron (ZOFRAN ODT) 8 MG disintegrating tablet Take 1 tablet (8 mg total) by mouth every 8 (eight) hours as needed for nausea or vomiting. Patient not taking: Reported on 01/02/2022 01/13/21   Marrian Salvage, FNP  ondansetron (ZOFRAN) 4 MG tablet Take 1 tablet (4 mg total) by mouth every 8 (eight) hours as needed for nausea or vomiting. 12/30/21   Hoyt Koch, MD  predniSONE (DELTASONE) 20 MG tablet Take 2 tablets daily with breakfast. 11/26/21   Jaynee Eagles, PA-C  promethazine (PHENERGAN) 25 MG tablet TAKE 1 TABLET BY MOUTH EVERY 8 HOURS AS NEEDED FOR NAUSEA OR VOMITING. 05/15/21   Hoyt Koch, MD  rosuvastatin (CRESTOR) 20 MG tablet Take 1 tablet (20 mg total) by mouth daily. 04/07/21   Hoyt Koch, MD  thiamine (VITAMIN B-1) 100  MG tablet Take 1 tablet (100 mg total) by mouth daily. 09/19/20   Janith Lima, MD  levocetirizine (XYZAL) 5 MG tablet TAKE 1 TABLET(5 MG) BY MOUTH EVERY EVENING 02/21/20 06/10/20  Janith Lima, MD    Physical Exam: Vitals:   01/02/22 0315 01/02/22 0330 01/02/22 0345 01/02/22 0400  BP: 126/90 (!) 130/46 (!) 161/67 (!) 190/74  Pulse: 86 82 80 79  Resp: 16 (!) 21 17 18   Temp:      TempSrc:      SpO2: 97% 97% 96% 99%  Weight:      Height:        Physical Exam Vitals and nursing note reviewed.  Constitutional:      General: She is not in acute distress.    Appearance: Normal appearance. She is not ill-appearing, toxic-appearing or diaphoretic.  HENT:     Head: Normocephalic and atraumatic.  Eyes:     Comments: +bilateral periorbital edema  Neck:     Comments: +tracheostomy Cardiovascular:     Rate and Rhythm: Normal rate and regular rhythm.     Pulses: Normal pulses.  Pulmonary:     Effort: Pulmonary effort is normal. No respiratory distress.     Breath sounds: Normal breath sounds. No wheezing or rales.  Abdominal:     General: Bowel sounds are normal. There is no distension.     Tenderness: There is no abdominal tenderness. There is no guarding or rebound.  Musculoskeletal:     Right lower leg: No edema.     Left lower leg: No edema.  Skin:    General: Skin is warm and dry.     Capillary Refill: Capillary refill takes less than 2 seconds.  Neurological:     Mental Status: She is alert.     Labs on Admission: I have personally reviewed following labs and imaging studies  CBC: Recent Labs  Lab 01/02/22 0034  WBC 10.6*  NEUTROABS 7.4  HGB 10.5*  HCT 32.1*  MCV 87.5  PLT 182   Basic Metabolic Panel: Recent Labs  Lab 01/02/22 0034  NA 140  K 5.0  CL 106  CO2 23  GLUCOSE 116*  BUN 66*  CREATININE 2.70*  CALCIUM 9.1   GFR: Estimated Creatinine Clearance: 15.2  mL/min (A) (by C-G formula based on SCr of 2.7 mg/dL (H)). Liver Function Tests: Recent  Labs  Lab 01/02/22 0034  AST 34  ALT 14  ALKPHOS 104  BILITOT 0.9  PROT 5.9*  ALBUMIN 3.1*   No results for input(s): LIPASE, AMYLASE in the last 168 hours. No results for input(s): AMMONIA in the last 168 hours. Coagulation Profile: No results for input(s): INR, PROTIME in the last 168 hours. Cardiac Enzymes: No results for input(s): CKTOTAL, CKMB, CKMBINDEX, TROPONINI in the last 168 hours. BNP (last 3 results) No results for input(s): PROBNP in the last 8760 hours. HbA1C: No results for input(s): HGBA1C in the last 72 hours. CBG: No results for input(s): GLUCAP in the last 168 hours. Lipid Profile: No results for input(s): CHOL, HDL, LDLCALC, TRIG, CHOLHDL, LDLDIRECT in the last 72 hours. Thyroid Function Tests: No results for input(s): TSH, T4TOTAL, FREET4, T3FREE, THYROIDAB in the last 72 hours. Anemia Panel: No results for input(s): VITAMINB12, FOLATE, FERRITIN, TIBC, IRON, RETICCTPCT in the last 72 hours. Urine analysis:    Component Value Date/Time   COLORURINE YELLOW 08/11/2021 [place] 08/11/2021 0942   LABSPEC 1.020 08/11/2021 0942   PHURINE 6.0 08/11/2021 0942   GLUCOSEU NEGATIVE 08/11/2021 0942   HGBUR NEGATIVE 08/11/2021 0942   BILIRUBINUR NEGATIVE 08/11/2021 0942   BILIRUBINUR Negative 01/19/2019 [place] 08/11/2021 0942   PROTEINUR 30 (A) 01/13/2021 2312   UROBILINOGEN 0.2 08/11/2021 0942   NITRITE NEGATIVE 08/11/2021 0942   LEUKOCYTESUR NEGATIVE 08/11/2021 0942    Radiological Exams on Admission: I have personally reviewed images CT HEAD WO CONTRAST (5MM)  Result Date: 01/02/2022 CLINICAL DATA:  Altered mental status EXAM: CT HEAD WITHOUT CONTRAST TECHNIQUE: Contiguous axial images were obtained from the base of the skull through the vertex without intravenous contrast. RADIATION DOSE REDUCTION: This exam was performed according to the departmental dose-optimization program which includes automated exposure control,  adjustment of the mA and/or kV according to patient size and/or use of iterative reconstruction technique. COMPARISON:  08/14/2021 FINDINGS: Brain: No evidence of acute infarction, hemorrhage, hydrocephalus, extra-axial collection or mass lesion/mass effect. Mild atrophic changes are noted. Vascular: No hyperdense vessel or unexpected calcification. Skull: Normal. Negative for fracture or focal lesion. Sinuses/Orbits: No acute finding. Other: None. IMPRESSION: Mild atrophic changes without acute abnormality. Electronically Signed   By: Inez Catalina M.D.   On: 01/02/2022 01:29   DG Chest Port 1 View  Result Date: 01/02/2022 CLINICAL DATA:  Headache and periorbital edema. EXAM: PORTABLE CHEST 1 VIEW COMPARISON:  December 30, 2021 FINDINGS: The heart size and mediastinal contours are within normal limits. There is mild to moderate severity calcification of the aortic arch. Both lungs are clear. The visualized skeletal structures are unremarkable. IMPRESSION: No active disease. Electronically Signed   By: Virgina Norfolk M.D.   On: 01/02/2022 00:46    EKG: I have personally reviewed EKG: NSR   Assessment/Plan Principal Problem:   Malignant hypertension Active Problems:   Elevated troponin   CKD stage 4 secondary to hypertension ([place])   History of tracheostomy   S/P laryngectomy    Malignant hypertension Admit to progressive bed. Observation. Per EDP, bedside echo by cardiology showed normal LVEF. Will start coreg 6.25 mg q12h. Son would like cardiology to manage pt's HTN meds. He is not happy with pt's PCP management of BP meds.  Elevated troponin Cardiology has started IV heparin. Cycle troponin. Echo ordered.  CKD stage 4 secondary to hypertension (  HCC) Chronic.  History of tracheostomy Chronic.  S/P laryngectomy Chronic.  DVT prophylaxis: IV heparin gtts Code Status: Full Code Family Communication: discussed with pt's son leo at bedside  Disposition Plan: return home  Consults  called: cardiology  Admission status: Observation,  progressive   Kristopher Oppenheim, DO [facility] 01/02/2022, 4:40 AM

## 2022-01-02 NOTE — Subjective & Objective (Signed)
CC: periorbital edema, HTN HPI: 76 year old Hispanic female with a history of head neck cancer status post laryngectomy, history of CKD stage IV, history of hypertension who presents to the ER today with a overnight history of headache and periorbital edema.  Son states the patient's blood pressure is extremely elevated at home with systolic blood pressures greater than 250.  Patient brought to the ER for evaluation.  Patient unable to speak due to her prior laryngectomy.  History is obtained from the patient's son.  On arrival, blood pressure 256/95 satting 99% on room air.  Patient given 1 dose of IV labetalol which dropped her blood pressure from 201/90-120 5/102.  Patient also noted to have elevated troponins of 1457.  Cardiology was consulted.  Per verbal report from the EDP, cardiology performed a bedside echo which showed normal LV function.  Patient started on heparin.  Due to the patient's bleed hypotension and elevated troponin, [facility] hospitalist contacted for admission.

## 2022-01-02 NOTE — Assessment & Plan Note (Addendum)
Troponin peaked to [facility] on board. Suspect demand ischemia in the setting of hypertensive emergency.  No anginal symptoms. 2D echo shows normal LVEF. Briefly on IV heparin drip, since discontinued.

## 2022-01-02 NOTE — Assessment & Plan Note (Addendum)
Cardiology on board. CHA2DS2-VASc score: 5 Briefly on IV heparin, now on reduced dose Eliquis 2.5 Mg twice daily due to renal insufficiency TSH normal Remains in sinus rhythm on telemetry.[name]

## 2022-01-02 NOTE — Progress Notes (Signed)
PROGRESS NOTE        PATIENT DETAILS Name: [name] Age: 76 y.o. Sex: female Date of Birth: October 06, 1946 Admit Date: 01/01/2022 Admitting Physician Kristopher Oppenheim, DO YQI:[id number], Real Cons, MD  Brief Narrative: Patient is a 76 y.o. female with history of head/neck cancer-s/p laryngectomy-tracheostomy in place-history of CAD (non-STEMI January 2022-managed medically), CKD stage IV who presented with hypertensive emergency and elevated troponins.  See below for further details  Subjective: Lying comfortably in bed-denies any chest pain or shortness of breath.  Does not verbalize due to laryngectomy-no family at bedside.  Shakes head yes or no.  Objective: Vitals: Blood pressure (!) 142/51, pulse 76, temperature 98.2 F (36.8 C), temperature source Oral, resp. rate 16, height 5\' 4"  (1.626 m), weight 54.4 kg, SpO2 93 %.   Exam: Gen Exam:Alert awake-not in any distress HEENT:atraumatic, normocephalic Chest: B/L clear to auscultation anteriorly CVS:S1S2 regular Abdomen:soft non tender, non distended Extremities:no edema Neurology: Non focal Skin: no rash  Pertinent Labs/Radiology: CBC Latest Ref Rng & Units 01/02/2022 01/02/2022 08/11/2021  WBC 4.0 - 10.5 K/uL 9.3 10.6(H) 7.3  Hemoglobin 12.0 - 15.0 g/dL 9.7(L) 10.5(L) 10.8(L)  Hematocrit 36.0 - 46.0 % 29.9(L) 32.1(L) 32.2(L)  Platelets 150 - 400 K/uL 180 221 233.0    Lab Results  Component Value Date   NA 139 01/02/2022   K 4.4 01/02/2022   CL 109 01/02/2022   CO2 19 (L) 01/02/2022      Assessment/Plan: * Malignant hypertension- (present on admission) Improving-BP in the 638 systolic range this morning-I have asked nurse to hold Coreg and allow it to increase further.  We will start some gentle IV fluid hydration.  Watch closely.   Elevated troponin- (present on admission) Likely myocardial injury in the setting of uncontrolled hypertension-awaiting echo-on aspirin/statin/beta-blocker/IV  heparin.  Will defer further to cardiology.  PAF (paroxysmal atrial fibrillation)-CHADS2 Vas score of 5- (present on admission) Maintaining sinus rhythm-currently on IV heparin-given CHA2DS2-VASc score-would likely benefit from long-term anticoagulation.  COPD (chronic obstructive pulmonary disease) with chronic bronchitis ([place])- (present on admission) Stable-continue with bronchodilators.  History of head and neck cancer-s/p laryngectomy-with tracheostomy Chronic issue-stable for outpatient follow-up with ENT/oncology.  CKD stage 4 secondary to hypertension ([place])- (present on admission) Close to baseline-follow electrolytes closely-avoid nephrotoxic agents.  S/P laryngectomy Chronic.  Hypothyroidism- (present on admission) Continue levothyroxine.   BMI: Estimated body mass index is 20.6 kg/m as calculated from the following:   Height as of this encounter: 5\' 4"  (1.626 m).   Weight as of this encounter: 54.4 kg.    DVT Prophylaxis: IV Heparin Procedures: None Consults: Cardiology Code Status:Full code Family Communication: None at bedside   Disposition Plan: Status is: Observation  The patient will require care spanning > 2 midnights and should be moved to inpatient because: Non-STEMI/hypertensive emergency-awaiting echo and further cardiology evaluation.  Not yet stable for discharge.   Diet: Diet Order             Diet Heart Room service appropriate? Yes; Fluid consistency: Thin  Diet effective now                     Antimicrobial agents: Anti-infectives (From admission, onward)    None        MEDICATIONS: Scheduled Meds:  aspirin EC  81 mg Oral Daily   carvedilol  6.25 mg  Oral BID WC   DULoxetine  20 mg Oral Daily   levothyroxine  50 mcg Oral QAC breakfast   mirtazapine  15 mg Oral QHS   [START ON 01/03/2022] rosuvastatin  20 mg Oral Daily   Continuous Infusions:  sodium chloride 75 mL/hr at 01/02/22 0842   heparin 800 Units/hr (01/02/22  0433)   PRN Meds:.acetaminophen **OR** acetaminophen, ondansetron **OR** ondansetron (ZOFRAN) IV   I have personally reviewed following labs and imaging studies  LABORATORY DATA: CBC: Recent Labs  Lab 01/02/22 0034 01/02/22 0811  WBC 10.6* 9.3  NEUTROABS 7.4 6.1  HGB 10.5* 9.7*  HCT 32.1* 29.9*  MCV 87.5 89.0  PLT 221 881    Basic Metabolic Panel: Recent Labs  Lab 01/02/22 0034 01/02/22 0811  NA 140 139  K 5.0 4.4  CL 106 109  CO2 23 19*  GLUCOSE 116* 110*  BUN 66* 61*  CREATININE 2.70* 2.67*  CALCIUM 9.1 8.9  MG  --  1.7    GFR: Estimated Creatinine Clearance: 15.4 mL/min (A) (by C-G formula based on SCr of 2.67 mg/dL (H)).  Liver Function Tests: Recent Labs  Lab 01/02/22 0034 01/02/22 0811  AST 34 19  ALT 14 13  ALKPHOS 104 88  BILITOT 0.9 0.2*  PROT 5.9* 5.3*  ALBUMIN 3.1* 2.8*   No results for input(s): LIPASE, AMYLASE in the last 168 hours. No results for input(s): AMMONIA in the last 168 hours.  Coagulation Profile: No results for input(s): INR, PROTIME in the last 168 hours.  Cardiac Enzymes: No results for input(s): CKTOTAL, CKMB, CKMBINDEX, TROPONINI in the last 168 hours.  BNP (last 3 results) No results for input(s): PROBNP in the last 8760 hours.  Lipid Profile: No results for input(s): CHOL, HDL, LDLCALC, TRIG, CHOLHDL, LDLDIRECT in the last 72 hours.  Thyroid Function Tests: No results for input(s): TSH, T4TOTAL, FREET4, T3FREE, THYROIDAB in the last 72 hours.  Anemia Panel: No results for input(s): VITAMINB12, FOLATE, FERRITIN, TIBC, IRON, RETICCTPCT in the last 72 hours.  Urine analysis:    Component Value Date/Time   COLORURINE YELLOW 08/11/2021 [place] 08/11/2021 0942   LABSPEC 1.020 08/11/2021 0942   PHURINE 6.0 08/11/2021 0942   GLUCOSEU NEGATIVE 08/11/2021 0942   HGBUR NEGATIVE 08/11/2021 0942   BILIRUBINUR NEGATIVE 08/11/2021 0942   BILIRUBINUR Negative 01/19/2019 [place]  08/11/2021 0942   PROTEINUR 30 (A) 01/13/2021 2312   UROBILINOGEN 0.2 08/11/2021 0942   NITRITE NEGATIVE 08/11/2021 0942   LEUKOCYTESUR NEGATIVE 08/11/2021 0942    Sepsis Labs: Lactic Acid, Venous    Component Value Date/Time   LATICACIDVEN 1.99 10/29/2015 2331    MICROBIOLOGY: Recent Results (from the past 240 hour(s))  Resp Panel by RT-PCR (Flu A&B, Covid) Nasopharyngeal Swab     Status: None   Collection Time: 01/02/22 12:32 AM   Specimen: Nasopharyngeal Swab; Nasopharyngeal(NP) swabs in vial transport medium  Result Value Ref Range Status   SARS Coronavirus 2 by RT PCR NEGATIVE NEGATIVE Final    Comment: (NOTE) SARS-CoV-2 target nucleic acids are NOT DETECTED.  The SARS-CoV-2 RNA is generally detectable in upper respiratory specimens during the acute phase of infection. The lowest concentration of SARS-CoV-2 viral copies this assay can detect is 138 copies/mL. A negative result does not preclude SARS-Cov-2 infection and should not be used as the sole basis for treatment or other patient management decisions. A negative result may occur with  improper specimen collection/handling, submission of specimen other than  nasopharyngeal swab, presence of viral mutation(s) within the areas targeted by this assay, and inadequate number of viral copies(<138 copies/mL). A negative result must be combined with clinical observations, patient history, and epidemiological information. The expected result is Negative.  Fact Sheet for Patients:  EntrepreneurPulse.com.au  Fact Sheet for Healthcare Providers:  IncredibleEmployment.be  This test is no t yet approved or cleared by the [place] FDA and  has been authorized for detection and/or diagnosis of SARS-CoV-2 by FDA under an Emergency Use Authorization (EUA). This EUA will remain  in effect (meaning this test can be used) for the duration of the COVID-19 declaration under Section 564(b)(1) of  the Act, 21 U.S.C.section 360bbb-3(b)(1), unless the authorization is terminated  or revoked sooner.       Influenza A by PCR NEGATIVE NEGATIVE Final   Influenza B by PCR NEGATIVE NEGATIVE Final    Comment: (NOTE) The Xpert Xpress SARS-CoV-2/FLU/RSV plus assay is intended as an aid in the diagnosis of influenza from Nasopharyngeal swab specimens and should not be used as a sole basis for treatment. Nasal washings and aspirates are unacceptable for Xpert Xpress SARS-CoV-2/FLU/RSV testing.  Fact Sheet for Patients: EntrepreneurPulse.com.au  Fact Sheet for Healthcare Providers: IncredibleEmployment.be  This test is not yet approved or cleared by the [place] FDA and has been authorized for detection and/or diagnosis of SARS-CoV-2 by FDA under an Emergency Use Authorization (EUA). This EUA will remain in effect (meaning this test can be used) for the duration of the COVID-19 declaration under Section 564(b)(1) of the Act, 21 U.S.C. section 360bbb-3(b)(1), unless the authorization is terminated or revoked.  Performed at [facility], [place] [address] [place], [place] [address]     RADIOLOGY STUDIES/RESULTS: CT HEAD WO CONTRAST (5MM)  Result Date: 01/02/2022 CLINICAL DATA:  Altered mental status EXAM: CT HEAD WITHOUT CONTRAST TECHNIQUE: Contiguous axial images were obtained from the base of the skull through the vertex without intravenous contrast. RADIATION DOSE REDUCTION: This exam was performed according to the departmental dose-optimization program which includes automated exposure control, adjustment of the mA and/or kV according to patient size and/or use of iterative reconstruction technique. COMPARISON:  08/14/2021 FINDINGS: Brain: No evidence of acute infarction, hemorrhage, hydrocephalus, extra-axial collection or mass lesion/mass effect. Mild atrophic changes are noted. Vascular: No hyperdense vessel or unexpected calcification.  Skull: Normal. Negative for fracture or focal lesion. Sinuses/Orbits: No acute finding. Other: None. IMPRESSION: Mild atrophic changes without acute abnormality. Electronically Signed   By: Inez Catalina M.D.   On: 01/02/2022 01:29   DG Chest Port 1 View  Result Date: 01/02/2022 CLINICAL DATA:  Headache and periorbital edema. EXAM: PORTABLE CHEST 1 VIEW COMPARISON:  December 30, 2021 FINDINGS: The heart size and mediastinal contours are within normal limits. There is mild to moderate severity calcification of the aortic arch. Both lungs are clear. The visualized skeletal structures are unremarkable. IMPRESSION: No active disease. Electronically Signed   By: Virgina Norfolk M.D.   On: 01/02/2022 00:46     LOS: 0 days   Oren Binet, MD  [facility]    To contact the attending provider between 7A-7P or the covering provider during after hours 7P-7A, please log into the web site [email or web address] and access using universal [place] password for that web site. If you do not have the password, please call the hospital operator.  01/02/2022, 11:06 AM

## 2022-01-02 NOTE — Assessment & Plan Note (Signed)
X-ray reviewed by MD prior to radiologist read without signs of acute pneumonia. She was encouraged not to take any more antibiotics which is what they had previously requested. Likely post-viral cough from December. Continue current medication.

## 2022-01-02 NOTE — Assessment & Plan Note (Signed)
Stable-continue with bronchodilators.

## 2022-01-02 NOTE — TOC Initial Note (Deleted)
Transition of Care [facility]) - Initial/Assessment Note    Patient Details  Name: [name] MRN: [id number] Date of Birth: [birth year]  [facility]) CM/SW Contact:    Angelita Ingles, RN Phone Number:[phone]  01/02/2022, 4:27 PM  Clinical Narrative:                 Expected Discharge Plan: [place] Barriers to Discharge: Continued Medical Work up   Patient Goals and CMS Choice Patient states their goals for this hospitalization and ongoing recovery are:: patient unable to speak CMS Medicare.gov Compare Post Acute Care list provided to:: Patient Represenative (must comment) Choice offered to / list presented to : Adult Children Gildardo Pounds)  Expected Discharge Plan and Services Expected Discharge Plan: [place] In-house Referral: NA Discharge Planning Services: CM Consult Post Acute Care Choice: Home Health                   DME Arranged: N/A DME Agency: NA       HH Arranged: RN          Prior Living Arrangements/Services     Patient language and need for interpreter reviewed:: Yes Do you feel safe going back to the place where you live?:  (patient non verbal)      Need for Family Participation in Patient Care: Yes (Comment) Care giver support system in place?: Yes (comment) Current home services: Home RN Criminal Activity/Legal Involvement Pertinent to Current Situation/Hospitalization: No - Comment as needed  Activities of Daily Living      Permission Sought/Granted   Permission granted to share information with : No              Emotional Assessment   Attitude/Demeanor/Rapport: Unable to Assess Affect (typically observed): Unable to Assess   Alcohol / Substance Use: Not Applicable Psych Involvement: No (comment)  Admission diagnosis:  Malignant hypertension [I10] Hypertensive emergency [I16.1] Patient Active Problem List   Diagnosis Date Noted   Malignant hypertension 01/02/2022    History of head and neck cancer-s/p laryngectomy-with tracheostomy 01/02/2022   PAF (paroxysmal atrial fibrillation)-CHADS2 Vas score of 5 01/02/2022   Cough 01/02/2022   Neck pain 07/11/2021   Aortic atherosclerosis ([place]) 04/09/2021   Moderate protein-calorie malnutrition ([place]) 04/09/2021   Low back pain 01/23/2021   HLD (hyperlipidemia) 01/13/2021   Elevated troponin 12/27/2020   Hypothyroidism 02/25/2020   Senile osteoporosis 02/15/2020   PAD (peripheral artery disease) ([place]) 02/15/2020   Tinea corporis 02/15/2020   Gastroesophageal reflux disease without esophagitis 09/09/2019   Screen for colon cancer 01/19/2019   Osteopenia 01/19/2019   Breast cancer screening by mammogram 01/19/2019   CKD stage 4 secondary to hypertension ([place]) 01/19/2019   Thiamine deficiency 01/02/2018   Prediabetes 12/28/2017   Hyperlipidemia LDL goal <130 12/28/2017   COPD (chronic obstructive pulmonary disease) with chronic bronchitis ([place]) 12/28/2017   Dietary folate deficiency anemia 12/28/2017   Moderate episode of recurrent major depressive disorder ([place]) 10/22/2017   S/P laryngectomy 11/03/2016   Dysphagia, pharyngoesophageal phase 05/22/2014   History of radiation therapy    Chronic pain syndrome 09/03/2012   COPD (chronic obstructive pulmonary disease) ([place]) 08/10/2012   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 08/08/2012    Class: Chronic   Hypertension    GAD (generalized anxiety disorder)    PCP:  Hoyt Koch, MD Pharmacy:   [id number] - [place], [place] [address] Phone: [phone] Fax: [phone]  [id number] - [place], [place] [address] [place] [address] Phone: [phone] Fax: [phone]     Social Determinants of Health (SDOH) Interventions    Readmission Risk Interventions Readmission Risk Prevention  Plan 01/16/2021 01/02/2021  Transportation Screening Complete Complete  PCP or Specialist Appt within 3-5 Days Complete Complete  HRI or Home Care Consult Complete Complete  Social Work Consult for [place] Planning/Counseling Complete Complete  Palliative Care Screening Not Applicable Not Applicable  Medication Review Press photographer) Complete Complete  Some recent data might be hidden

## 2022-01-02 NOTE — Progress Notes (Signed)
[place] for heparin Indication: chest pain/ACS  Allergies  Allergen Reactions   Xyzal [Levocetirizine Dihydrochloride] Itching   Augmentin [Amoxicillin-Pot Clavulanate] Diarrhea and Nausea And Vomiting   Tribenzor [Olmesartan-Amlodipine-Hctz] Other (See Comments)    "Hurt the kidneys"    Patient Measurements: Height: 5\' 4"  (162.6 cm) Weight: 54.4 kg (120 lb) IBW/kg (Calculated) : 54.7  Vital Signs: Temp: 98.2 F (36.8 C) (01/20 1400) Temp Source: Oral (01/20 1400) BP: 224/65 (01/20 1512) Pulse Rate: 75 (01/20 1512)  Labs: Recent Labs    01/02/22 0034 01/02/22 0235 01/02/22 0811  HGB 10.5*  --  9.7*  HCT 32.1*  --  29.9*  PLT 221  --  180  CREATININE 2.70*  --  2.67*  TROPONINIHS 1,457* 1,357*  --      Estimated Creatinine Clearance: 15.4 mL/min (A) (by C-G formula based on SCr of 2.67 mg/dL (H)).   Medical History: Past Medical History:  Diagnosis Date   Anemia    Anxiety    takes Ativan prn   Blood transfusion without reported diagnosis 09/15/12   2 units Prbc's   Broken ribs    Chronic back pain    CKD (chronic kidney disease), stage IV (HCC)    Constipation    related to pain meds   COPD (chronic obstructive pulmonary disease) ([place]) 08/10/2012   denies   Depression    Gastrostomy in place [facility])    removed   GERD (gastroesophageal reflux disease)    takes Zantac daily   Headache(784.0)    Hiatal hernia 08/10/2012   History of radiation therapy 10/17/12-11/25/12   supraglottic larynx,high risk neck tumor bed 5880 cGy/28 sessions, high risk lymph node tumor bed 5600 cGy/20 sessions, mod risk lymph node tumor bed 5040 cGy/20 sessions   Hypercholesteremia    takes Pravastatin daily   Hypertension    takes Tribenzor and Atenolol daily   Insomnia    takes Amitriptyline daily   Nausea    takes Zofran prn   PAD (peripheral artery disease) ([place])    noninvasive imaging in 2016   Pneumonia    SCC (squamous cell  carcinoma) of supraglottis area 08/08/2012   Shortness of breath dyspnea    Stroke [facility]) 2011   denies. no residual   Uterine cancer [facility])     Assessment: 76yo female c/o sx related to hypertension, troponin found to be elevated, to begin heparin; of note pt was previously on Eliquis x23mo for distal DVT, no longer taking (she is also noted with afib).  -aPTT= 68, heparin level= 0.45  Goal of Therapy:  Heparin level 0.3-0.7 units/ml Monitor platelets by anticoagulation protocol: Yes   Plan:  -Continue heparin 800 units/hr -Daily heparin level and CBC  Hildred Laser, PharmD Clinical Pharmacist **Pharmacist phone directory can now be found on [email or web address] ([email or web address]).  Listed under [place].

## 2022-01-02 NOTE — Progress Notes (Signed)
°  Echocardiogram 2D Echocardiogram has been performed.  Darlina Sicilian M 01/02/2022, 12:07 PM

## 2022-01-02 NOTE — Progress Notes (Signed)
Progress Note  Patient Name: [name] Date of Encounter: 01/02/2022  [facility] Cardiologist: Mertie Moores, MD   Subjective   Patient denies CP  No SOB   Headache gone   Inpatient Medications    Scheduled Meds:  aspirin EC  81 mg Oral Daily   carvedilol  6.25 mg Oral BID WC   DULoxetine  20 mg Oral Daily   levothyroxine  50 mcg Oral QAC breakfast   mirtazapine  15 mg Oral QHS   [START ON 01/03/2022] rosuvastatin  20 mg Oral Daily   Continuous Infusions:  heparin 800 Units/hr (01/02/22 0433)   PRN Meds: acetaminophen **OR** acetaminophen, hydrALAZINE, ondansetron **OR** ondansetron (ZOFRAN) IV   Vital Signs    Vitals:   01/02/22 1221 01/02/22 1400 01/02/22 1451 01/02/22 1512  BP: (!) 215/89 (!) 182/85 (!) 242/62 (!) 224/65  Pulse: 79 81 77 75  Resp: (!) 24 18 15 13   Temp:  98.2 F (36.8 C)    TempSrc:  Oral    SpO2: 96% 91% 94% 95%  Weight:      Height:        Intake/Output Summary (Last 24 hours) at 01/02/2022 1525 Last data filed at 01/02/2022 1304 Gross per 24 hour  Intake 280 ml  Output --  Net 280 ml   Last 3 Weights 01/01/2022 08/11/2021 07/18/2021  Weight (lbs) 120 lb (No Data) 120 lb  Weight (kg) 54.432 kg (No Data) 54.432 kg      Telemetry    SR  - Personally Reviewed  ECG     - Personally Reviewed  Physical Exam   GEN: No acute distress.   Neck: No JVD  Trach Cardiac: RRR, no murmurs, Respiratory: Clear to auscultation bilaterally. GI: Soft, nontender, non-distended  MS: No edema; No deformity.    Labs    High Sensitivity Troponin:   Recent Labs  Lab 01/02/22 0034 01/02/22 0235  TROPONINIHS 1,457* 1,357*     Chemistry Recent Labs  Lab 01/02/22 0034 01/02/22 0811  NA 140 139  K 5.0 4.4  CL 106 109  CO2 23 19*  GLUCOSE 116* 110*  BUN 66* 61*  CREATININE 2.70* 2.67*  CALCIUM 9.1 8.9  MG  --  1.7  PROT 5.9* 5.3*  ALBUMIN 3.1* 2.8*  AST 34 19  ALT 14 13  ALKPHOS 104 88  BILITOT 0.9 0.2*  GFRNONAA 18* 18*   ANIONGAP 11 11    Lipids No results for input(s): CHOL, TRIG, HDL, LABVLDL, LDLCALC, CHOLHDL in the last 168 hours.  Hematology Recent Labs  Lab 01/02/22 0034 01/02/22 0811  WBC 10.6* 9.3  RBC 3.67* 3.36*  HGB 10.5* 9.7*  HCT 32.1* 29.9*  MCV 87.5 89.0  MCH 28.6 28.9  MCHC 32.7 32.4  RDW 13.8 13.7  PLT 221 180   Thyroid No results for input(s): TSH, FREET4 in the last 168 hours.  BNP Recent Labs  Lab 01/02/22 0034  BNP 49.3    DDimer No results for input(s): DDIMER in the last 168 hours.   Radiology    CT HEAD WO CONTRAST (5MM)  Result Date: 01/02/2022 CLINICAL DATA:  Altered mental status EXAM: CT HEAD WITHOUT CONTRAST TECHNIQUE: Contiguous axial images were obtained from the base of the skull through the vertex without intravenous contrast. RADIATION DOSE REDUCTION: This exam was performed according to the departmental dose-optimization program which includes automated exposure control, adjustment of the mA and/or kV according to patient size and/or use of iterative reconstruction technique.  COMPARISON:  08/14/2021 FINDINGS: Brain: No evidence of acute infarction, hemorrhage, hydrocephalus, extra-axial collection or mass lesion/mass effect. Mild atrophic changes are noted. Vascular: No hyperdense vessel or unexpected calcification. Skull: Normal. Negative for fracture or focal lesion. Sinuses/Orbits: No acute finding. Other: None. IMPRESSION: Mild atrophic changes without acute abnormality. Electronically Signed   By: Inez Catalina M.D.   On: 01/02/2022 01:29   DG Chest Port 1 View  Result Date: 01/02/2022 CLINICAL DATA:  Headache and periorbital edema. EXAM: PORTABLE CHEST 1 VIEW COMPARISON:  December 30, 2021 FINDINGS: The heart size and mediastinal contours are within normal limits. There is mild to moderate severity calcification of the aortic arch. Both lungs are clear. The visualized skeletal structures are unremarkable. IMPRESSION: No active disease. Electronically Signed    By: Virgina Norfolk M.D.   On: 01/02/2022 00:46   ECHOCARDIOGRAM COMPLETE  Result Date: 01/02/2022    ECHOCARDIOGRAM REPORT   Patient Name:   [name] Date of Exam: 01/02/2022 Medical Rec #:  [id number]        Height:       64.0 in Accession #:    [id number]       Weight:       120.0 lb Date of Birth:  July 18, 1946       BSA:          1.575 m Patient Age:    76 years         BP:           197/77 mmHg Patient Gender: F                HR:           78 bpm. Exam Location:  Inpatient Procedure: 2D Echo, 3D Echo, Cardiac Doppler, Color Doppler and Strain Analysis Indications:    NSTEMI I21.4  History:        Patient has prior history of Echocardiogram examinations, most                 recent 01/15/2021. COPD; Risk Factors:Hypertension and Former                 Smoker. Chronic kidney disease. Past history of cancer. History                 of laryngectomy.  Sonographer:    Darlina Sicilian RDCS Referring Phys: [phone] ERIC CHEN  Sonographer Comments: Global longitudinal strain was attempted. IMPRESSIONS  1. Left ventricular ejection fraction, by estimation, is 60 to 65%. The left ventricle has normal function. The left ventricle has no regional wall motion abnormalities. There is mild concentric left ventricular hypertrophy. Left ventricular diastolic parameters are consistent with Grade I diastolic dysfunction (impaired relaxation).  2. Right ventricular systolic function is normal. The right ventricular size is normal. Tricuspid regurgitation signal is inadequate for assessing PA pressure.  3. Left atrial size was moderately dilated.  4. The mitral valve is grossly normal. No evidence of mitral valve regurgitation.  5. The aortic valve is grossly normal. Aortic valve regurgitation is not visualized. Comparison(s): No significant change from prior study. FINDINGS  Left Ventricle: Left ventricular ejection fraction, by estimation, is 60 to 65%. The left ventricle has normal function. The left ventricle has no  regional wall motion abnormalities. The left ventricular internal cavity size was normal in size. There is  mild concentric left ventricular hypertrophy. Left ventricular diastolic parameters are consistent with Grade I diastolic dysfunction (impaired relaxation). Right Ventricle: The right ventricular  size is normal. No increase in right ventricular wall thickness. Right ventricular systolic function is normal. Tricuspid regurgitation signal is inadequate for assessing PA pressure. Left Atrium: Left atrial size was moderately dilated. Right Atrium: Right atrial size was normal in size. Pericardium: There is no evidence of pericardial effusion. Mitral Valve: The mitral valve is grossly normal. There is moderate thickening of the mitral valve leaflet(s). There is mild calcification of the mitral valve leaflet(s). No evidence of mitral valve regurgitation. Tricuspid Valve: The tricuspid valve is not well visualized. Tricuspid valve regurgitation is not demonstrated. Aortic Valve: The aortic valve is grossly normal. Aortic valve regurgitation is not visualized. Pulmonic Valve: The pulmonic valve was not well visualized. Pulmonic valve regurgitation is not visualized. Aorta: The aortic root and ascending aorta are structurally normal, with no evidence of dilitation. IAS/Shunts: The interatrial septum was not well visualized.  LEFT VENTRICLE PLAX 2D LVIDd:         4.15 cm   Diastology LVIDs:         1.90 cm   LV e' medial:    3.21 cm/s LV PW:         1.05 cm   LV E/e' medial:  16.9 LV IVS:        1.00 cm   LV e' lateral:   4.46 cm/s LVOT diam:     1.80 cm   LV E/e' lateral: 12.1 LVOT Area:     2.54 cm                           3D Volume EF:                          3D EF:        55 %                          LV EDV:       129 ml                          LV ESV:       59 ml                          LV SV:        71 ml RIGHT VENTRICLE RV S prime:     11.90 cm/s TAPSE (M-mode): 1.5 cm LEFT ATRIUM           Index         RIGHT ATRIUM           Index LA diam:      2.80 cm 1.78 cm/m   RA Area:     11.30 cm LA Vol (A2C): 62.3 ml 39.57 ml/m  RA Volume:   26.00 ml  16.51 ml/m LA Vol (A4C): 52.1 ml 33.09 ml/m   AORTA Ao Root diam: 2.90 cm Ao Asc diam:  3.20 cm MITRAL VALVE MV Area (PHT): 2.86 cm     SHUNTS MV Decel Time: 265 msec     Systemic Diam: 1.80 cm MV E velocity: 54.10 cm/s MV A velocity: 104.00 cm/s MV E/A ratio:  0.52 Mary Scientist, physiological signed by Phineas Inches Signature Date/Time: 01/02/2022/12:42:05 PM    Final     Cardiac Studies   Echo  01/02/22   1. Left ventricular  ejection fraction, by estimation, is 60 to 65%. The  left ventricle has normal function. The left ventricle has no regional  wall motion abnormalities. There is mild concentric left ventricular  hypertrophy. Left ventricular diastolic  parameters are consistent with Grade I diastolic dysfunction (impaired  relaxation).   2. Right ventricular systolic function is normal. The right ventricular  size is normal. Tricuspid regurgitation signal is inadequate for assessing  PA pressure.   3. Left atrial size was moderately dilated.   4. The mitral valve is grossly normal. No evidence of mitral valve  regurgitation.   5. The aortic valve is grossly normal. Aortic valve regurgitation is not  visualized.   Comparison(s): No significant change from prior study.   Patient Profile     [name] is a 76 y.o. female with a hx of poorly controlled HTN, HLD, PAF (not on [facility]?), PAD, CKD4 (bl sCr ~2.5), Laryngeal SCC s/p Laryngectomy and chemo/XRT, hypothyroidism, prior DVT, COPD, prior tobacco abuse, GERD and MDD who is being seen 01/02/2022 for the evaluation of hypertensive emergency at the request of Dr. Betsey Holiday.    Assessment & Plan    1  Hypertensive emergency   Hx of poorly controlled HTN  Pt presented [facility] emergency   SInce admit she has received IV hydralazine prn and carvedilol   BP is back to extremely high levels   Just given 10 mg hydralazine   BP now 150 Will increase carvedilol to 12.5 bid Add back oral hydralazine at 50 tid Add amlodipine 5 daily now   No olmesartan or HCTZ given renal    Follow     2  Elevated troponin   Trop 1457, 1357    Pt has presumed CAD   NSTEMI in Jan 2022  No cath due to kidney dz.   Did not tolerate stress test  Trop at admit that time wwer 1100.    3  PAF  Remains in SR      4.  Hypthyroidism  5   CKD  Stage IV   Baseline Cr was 2.5      Cr 2.67 today   6  Hx PAD  7  Laryngeal Ca, s/p laryngectomy and chemo/XRT in 2013    8  Hx DVT  9  Hx COPD       Signed, Dorris Carnes, MD  01/02/2022, 3:25 PM

## 2022-01-02 NOTE — Progress Notes (Signed)
Dr Dorris Carnes, cardiology came to floor, she will be watching patient's vitals this afternoon and will make adjustments to meds to help get bp down at a normal rate

## 2022-01-02 NOTE — Assessment & Plan Note (Signed)
Chronic. 

## 2022-01-02 NOTE — Assessment & Plan Note (Addendum)
Acute kidney injury complicating stage IV chronic kidney disease  Baseline creatinine approximately 2.5. Creatinine up to 3.44 on 1/23.  This is down to 3.16 on 1/24 and hopefully will get to her baseline soon. AKI may be related to hemodynamics from hypertensive emergency. Avoid nephrotoxic's, NSAIDs discontinued 1/23. Trend daily BMP.

## 2022-01-02 NOTE — Assessment & Plan Note (Deleted)
Labile BP with significant variations-increased amlodipine to 10 mg daily, increased hydralazine to 75 mg 3 times daily-continue Coreg.  Avoid aggressive reduction of BP.

## 2022-01-02 NOTE — Assessment & Plan Note (Addendum)
Chronic issue-stable for outpatient follow-up with ENT/oncology.
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
## 2022-01-02 NOTE — Progress Notes (Signed)
ANTICOAGULATION CONSULT NOTE - Initial Consult  Pharmacy Consult for heparin Indication: chest pain/ACS  Allergies  Allergen Reactions   Xyzal [Levocetirizine Dihydrochloride] Itching   Augmentin [Amoxicillin-Pot Clavulanate] Diarrhea and Nausea And Vomiting   Tribenzor [Olmesartan-Amlodipine-Hctz] Other (See Comments)    "Hurt the kidneys"    Patient Measurements: Height: 5\' 4"  (162.6 cm) Weight: 54.4 kg (120 lb) IBW/kg (Calculated) : 54.7  Vital Signs: Temp: 98.1 F (36.7 C) (01/19 2330) Temp Source: Oral (01/19 2330) BP: 126/90 (01/20 0315) Pulse Rate: 86 (01/20 0315)  Labs: Recent Labs    01/02/22 0034 01/02/22 0235  HGB 10.5*  --   HCT 32.1*  --   PLT 221  --   CREATININE 2.70*  --   TROPONINIHS 1,457* 1,357*    Estimated Creatinine Clearance: 15.2 mL/min (A) (by C-G formula based on SCr of 2.7 mg/dL (H)).   Medical History: Past Medical History:  Diagnosis Date   Anemia    Anxiety    takes Ativan prn   Blood transfusion without reported diagnosis 09/15/12   2 units Prbc's   Broken ribs    Chronic back pain    CKD (chronic kidney disease), stage IV (HCC)    Constipation    related to pain meds   COPD (chronic obstructive pulmonary disease) ([place]) 08/10/2012   denies   Depression    Gastrostomy in place [facility])    removed   GERD (gastroesophageal reflux disease)    takes Zantac daily   Headache(784.0)    Hiatal hernia 08/10/2012   History of radiation therapy 10/17/12-11/25/12   supraglottic larynx,high risk neck tumor bed 5880 cGy/28 sessions, high risk lymph node tumor bed 5600 cGy/20 sessions, mod risk lymph node tumor bed 5040 cGy/20 sessions   Hypercholesteremia    takes Pravastatin daily   Hypertension    takes Tribenzor and Atenolol daily   Insomnia    takes Amitriptyline daily   Nausea    takes Zofran prn   PAD (peripheral artery disease) ([place])    noninvasive imaging in 2016   Pneumonia    SCC (squamous cell carcinoma) of supraglottis  area 08/08/2012   Shortness of breath dyspnea    Stroke [facility]) 2011   denies. no residual   Uterine cancer [facility])     Assessment: 76yo female c/o sx related to hypertension, troponin found to be elevated, to begin heparin; of note pt was previously on Eliquis x63mo for distal DVT, no longer taking.  Goal of Therapy:  Heparin level 0.3-0.7 units/ml Monitor platelets by anticoagulation protocol: Yes   Plan:  Heparin 2000 units IV bolus x1 followed by infusion at 800 units/hr (based on prior heparin requirements). Heparin levels and CBC.  Wynona Neat, PharmD, BCPS  01/02/2022,4:08 AM

## 2022-01-02 NOTE — ED Notes (Signed)
DR Sloan Leiter notified of BP elevated, coreg given and fluids stopped per MD order.

## 2022-01-02 NOTE — TOC Benefit Eligibility Note (Signed)
Patient Teacher, English as a foreign language completed.    The patient is currently admitted and upon discharge could be taking Eliquis 5 mg.  The current 30 day co-pay is, $4.30.   The patient is currently admitted and upon discharge could be taking Xarelto 20 mg.  The current 30 day co-pay is, $4.30.   The patient is insured through [place], [place] Patient Advocate Specialist [place] Patient Advocate Team Direct Number: [phone]  Fax: [phone]

## 2022-01-02 NOTE — ED Notes (Signed)
Date and time results received: 01/02/22 0144 (use smartphrase ".now" to insert current time)  Test: troponin Critical Value: 1,457  Name of Provider Notified: Frederich Cha, MD

## 2022-01-02 NOTE — Assessment & Plan Note (Signed)
Unclear history but will check x-ray cervical spine which was placed via orders only encounter. She was add on and visit not added to system at time of visit and x-ray was closing. BP normal.

## 2022-01-02 NOTE — ED Notes (Signed)
Provider at bedside

## 2022-01-02 NOTE — Assessment & Plan Note (Signed)
Continue levothyroxine 

## 2022-01-02 NOTE — ED Provider Notes (Signed)
[facility] EMERGENCY DEPARTMENT Provider Note   CSN: [id number] Arrival date & time: 01/01/22  2326     History  Chief Complaint  Patient presents with   Hypertension    [name] is a 76 y.o. female.  Patient brought to the emergency department for evaluation of headache and high blood pressure.  Information provided by patient's son who translates for her.  Patient has been sick for more than a week.  She started with cough and congestion.  She was seen at urgent care and followed up with her primary doctor.  Primary doctor started her on antibiotic about a week ago which caused her to have onset of nausea, vomiting and diarrhea.  She has not been able to hold down her medications.  Patient has been complaining of a headache today and then became confused, telling her son that her dead husband visited her.  He reports that this is common when her blood pressure is high.  She has poorly controlled blood pressure, normal blood pressures around 756 systolic.  She has been as high as 270 in the past and this was in the setting of acute stroke.      Home Medications Prior to Admission medications   Medication Sig Start Date End Date Taking? Authorizing Provider  Baclofen 5 MG TABS Take 5 mg by mouth 2 (two) times daily as needed. 12/30/21   Hoyt Koch, MD  benzonatate (TESSALON PERLES) 100 MG capsule 1-2 capsules up to twice daily as needed for cough Patient not taking: Reported on 12/30/2021 12/23/21   Lucretia Kern, DO  cilostazol (PLETAL) 100 MG tablet Take 1 tablet (100 mg total) by mouth 2 (two) times daily. 04/07/21   Hoyt Koch, MD  diphenoxylate-atropine (LOMOTIL) 2.5-0.025 MG tablet Take 1 tablet by mouth 4 (four) times daily as needed for diarrhea or loose stools. 09/19/20   Janith Lima, MD  doxycycline (VIBRA-TABS) 100 MG tablet Take 1 tablet (100 mg total) by mouth 2 (two) times daily. Patient not taking: Reported on 12/30/2021  12/23/21   Lucretia Kern, DO  DULoxetine (CYMBALTA) 20 MG capsule Take 1 capsule (20 mg total) by mouth daily. 07/11/21   Hoyt Koch, MD  Ensure (ENSURE) Take 237 mLs by mouth 2 (two) times daily.    [provider]  famotidine (PEPCID) 40 MG tablet TAKE 1 TABLET BY MOUTH EVERY DAY 01/04/21   Janith Lima, MD  fluticasone [facility]) 50 MCG/ACT nasal spray SPRAY 2 SPRAYS INTO EACH NOSTRIL EVERY DAY 03/03/21   Janith Lima, MD  hydrALAZINE (APRESOLINE) 50 MG tablet TAKE 1.5 TABLETS (75 MG TOTAL) BY MOUTH 3 (THREE) TIMES DAILY. 09/05/21   Hoyt Koch, MD  isosorbide mononitrate (IMDUR) 60 MG 24 hr tablet Take 1 tablet (60 mg total) by mouth daily. 01/28/21   Imogene Burn, PA-C  lactose free nutrition (BOOST) LIQD Take 237 mLs by mouth 2 (two) times daily between meals.    [provider]  levothyroxine (SYNTHROID) 50 MCG tablet TAKE 1 TABLET BY MOUTH DAILY BEFORE BREAKFAST 03/14/21   Hoyt Koch, MD  loperamide (IMODIUM A-D) 2 MG tablet Take 2 mg by mouth 4 (four) times daily as needed for diarrhea or loose stools.    [provider]  LORazepam (ATIVAN) 1 MG tablet TAKE 1 TABLET BY MOUTH TWICE A DAY 11/28/21   Hoyt Koch, MD  metoprolol tartrate (LOPRESSOR) 50 MG tablet Take 1 tablet (50  mg total) by mouth 2 (two) times daily. 04/07/21   Hoyt Koch, MD  Olmesartan-amLODIPine-HCTZ 20-5-12.5 MG TABS Take 1 tablet by mouth daily. 04/07/21   Hoyt Koch, MD  ondansetron (ZOFRAN ODT) 8 MG disintegrating tablet Take 1 tablet (8 mg total) by mouth every 8 (eight) hours as needed for nausea or vomiting. 01/13/21   Marrian Salvage, FNP  ondansetron (ZOFRAN) 4 MG tablet Take 1 tablet (4 mg total) by mouth every 8 (eight) hours as needed for nausea or vomiting. 12/30/21   Hoyt Koch, MD  predniSONE (DELTASONE) 20 MG tablet Take 2 tablets daily with breakfast. 11/26/21   Jaynee Eagles, PA-C  promethazine  (PHENERGAN) 25 MG tablet TAKE 1 TABLET BY MOUTH EVERY 8 HOURS AS NEEDED FOR NAUSEA OR VOMITING. 05/15/21   Hoyt Koch, MD  rosuvastatin (CRESTOR) 20 MG tablet Take 1 tablet (20 mg total) by mouth daily. 04/07/21   Hoyt Koch, MD  thiamine (VITAMIN B-1) 100 MG tablet Take 1 tablet (100 mg total) by mouth daily. 09/19/20   Janith Lima, MD  levocetirizine (XYZAL) 5 MG tablet TAKE 1 TABLET(5 MG) BY MOUTH EVERY EVENING 02/21/20 06/10/20  Janith Lima, MD      Allergies    Xyzal [levocetirizine dihydrochloride], Augmentin [amoxicillin-pot clavulanate], and Tribenzor [olmesartan-amlodipine-hctz]    Review of Systems   Review of Systems  Neurological:  Positive for headaches.  Psychiatric/Behavioral:  Positive for confusion.   All other systems reviewed and are negative.  Physical Exam Updated Vital Signs BP (!) 190/74    Pulse 79    Temp 98.1 F (36.7 C) (Oral)    Resp 18    Ht 5\' 4"  (1.626 m)    Wt 54.4 kg    SpO2 99%    BMI 20.60 kg/m  Physical Exam Vitals and nursing note reviewed.  Constitutional:      General: She is not in acute distress.    Appearance: Normal appearance. She is well-developed.  HENT:     Head: Atraumatic.     Comments: Periorbital edema bilaterally    Right Ear: Hearing normal.     Left Ear: Hearing normal.     Nose: Nose normal.  Eyes:     Conjunctiva/sclera: Conjunctivae normal.     Pupils: Pupils are equal, round, and reactive to light.  Cardiovascular:     Rate and Rhythm: Regular rhythm.     Heart sounds: S1 normal and S2 normal. No murmur heard.   No friction rub. No gallop.  Pulmonary:     Effort: Pulmonary effort is normal. No respiratory distress.     Breath sounds: Normal breath sounds.  Chest:     Chest wall: No tenderness.  Abdominal:     General: Bowel sounds are normal.     Palpations: Abdomen is soft.     Tenderness: There is no abdominal tenderness. There is no guarding or rebound. Negative signs include Murphy's  sign and McBurney's sign.     Hernia: No hernia is present.  Musculoskeletal:        General: Normal range of motion.     Cervical back: Normal range of motion and neck supple.     Right lower leg: Edema present.     Left lower leg: Edema present.  Skin:    General: Skin is warm and dry.     Findings: No rash.  Neurological:     Mental Status: She is alert and oriented to person, place,  and time.     GCS: GCS eye subscore is 4. GCS verbal subscore is 5. GCS motor subscore is 6.     Cranial Nerves: No cranial nerve deficit.     Sensory: No sensory deficit.     Coordination: Coordination normal.  Psychiatric:        Speech: Speech normal.        Behavior: Behavior normal.        Thought Content: Thought content normal.    ED Results / Procedures / Treatments   Labs (all labs ordered are listed, but only abnormal results are displayed) Labs Reviewed  CBC WITH DIFFERENTIAL/PLATELET - Abnormal; Notable for the following components:      Result Value   WBC 10.6 (*)    RBC 3.67 (*)    Hemoglobin 10.5 (*)    HCT 32.1 (*)    Abs Immature Granulocytes 0.16 (*)    All other components within normal limits  COMPREHENSIVE METABOLIC PANEL - Abnormal; Notable for the following components:   Glucose, Bld 116 (*)    BUN 66 (*)    Creatinine, Ser 2.70 (*)    Total Protein 5.9 (*)    Albumin 3.1 (*)    GFR, Estimated 18 (*)    All other components within normal limits  TROPONIN I (HIGH SENSITIVITY) - Abnormal; Notable for the following components:   Troponin I (High Sensitivity) 1,457 (*)    All other components within normal limits  TROPONIN I (HIGH SENSITIVITY) - Abnormal; Notable for the following components:   Troponin I (High Sensitivity) 1,357 (*)    All other components within normal limits  RESP PANEL BY RT-PCR (FLU A&B, COVID) ARPGX2  BRAIN NATRIURETIC PEPTIDE  TSH  ALDOSTERONE + RENIN ACTIVITY W/ RATIO  HEPARIN LEVEL (UNFRACTIONATED)    EKG EKG  Interpretation  Date/Time:  Thursday January 01 2022 23:36:29 EST Ventricular Rate:  79 PR Interval:  150 QRS Duration: 94 QT Interval:  464 QTC Calculation: 532 R Axis:   53 Text Interpretation: Sinus rhythm RSR' in V1 or V2, probably normal variant Nonspecific repol abnormality, diffuse leads Prolonged QT interval since FEb 2022, afib replaced by SR Confirmed by Orpah Greek [phone]) on 01/02/2022 12:11:55 AM  Radiology CT HEAD WO CONTRAST (5MM)  Result Date: 01/02/2022 CLINICAL DATA:  Altered mental status EXAM: CT HEAD WITHOUT CONTRAST TECHNIQUE: Contiguous axial images were obtained from the base of the skull through the vertex without intravenous contrast. RADIATION DOSE REDUCTION: This exam was performed according to the departmental dose-optimization program which includes automated exposure control, adjustment of the mA and/or kV according to patient size and/or use of iterative reconstruction technique. COMPARISON:  08/14/2021 FINDINGS: Brain: No evidence of acute infarction, hemorrhage, hydrocephalus, extra-axial collection or mass lesion/mass effect. Mild atrophic changes are noted. Vascular: No hyperdense vessel or unexpected calcification. Skull: Normal. Negative for fracture or focal lesion. Sinuses/Orbits: No acute finding. Other: None. IMPRESSION: Mild atrophic changes without acute abnormality. Electronically Signed   By: Inez Catalina M.D.   On: 01/02/2022 01:29   DG Chest Port 1 View  Result Date: 01/02/2022 CLINICAL DATA:  Headache and periorbital edema. EXAM: PORTABLE CHEST 1 VIEW COMPARISON:  December 30, 2021 FINDINGS: The heart size and mediastinal contours are within normal limits. There is mild to moderate severity calcification of the aortic arch. Both lungs are clear. The visualized skeletal structures are unremarkable. IMPRESSION: No active disease. Electronically Signed   By: Virgina Norfolk M.D.   On: 01/02/2022 00:46  Procedures Procedures     Medications Ordered in ED Medications  heparin bolus via infusion 2,000 Units (has no administration in time range)  heparin ADULT infusion 100 units/mL (25000 units/22mL) (has no administration in time range)  rosuvastatin (CRESTOR) tablet 20 mg (has no administration in time range)  morphine 2 MG/ML injection 2 mg (2 mg Intravenous Given 01/02/22 0056)  ondansetron (ZOFRAN) injection 4 mg (4 mg Intravenous Given 01/02/22 0052)  hydrALAZINE (APRESOLINE) tablet 100 mg (100 mg Oral Given 01/02/22 0057)  labetalol (NORMODYNE) injection 20 mg (20 mg Intravenous Given 01/02/22 0245)  aspirin chewable tablet 324 mg (324 mg Oral Given 01/02/22 0259)    ED Course/ Medical Decision Making/ A&P                           Medical Decision Making Amount and/or Complexity of Data Reviewed Labs: ordered. Radiology: ordered.  Risk OTC drugs. Prescription drug management.   Patient presents to the emergency department for evaluation of headache in the setting of elevated blood pressure.  Patient has a history of hypertension that is poorly controlled.  Patient presents with a blood pressure of 132 systolic.  She is not experiencing chest pain, no ischemic changes on EKG.  CT head does not show any acute intracranial abnormality.  Patient's troponin is elevated.  Differential diagnosis would be ACS/NSTEMI versus troponin leak secondary to hypertensive emergency.  Patient given p.o. hydralazine which is her home medication.  She had only some improvement with her blood pressure.  She was given IV labetalol at which time her blood pressure returned to 440N systolic.  This was felt to be lower than what her goal would be which is around 027 systolic.  We will allow this to drift back up.  Cardiology consult appreciated.  Recommend heparinization, blood pressure control.  Unlikely to require heart catheterization.  Will admit to medicine.  CRITICAL CARE Performed by: Orpah Greek   Total  critical care time: 34 minutes  Critical care time was exclusive of separately billable procedures and treating other patients.  Critical care was necessary to treat or prevent imminent or life-threatening deterioration.  Critical care was time spent personally by me on the following activities: development of treatment plan with patient and/or surrogate as well as nursing, discussions with consultants, evaluation of patient's response to treatment, examination of patient, obtaining history from patient or surrogate, ordering and performing treatments and interventions, ordering and review of laboratory studies, ordering and review of radiographic studies, pulse oximetry and re-evaluation of patient's condition.         Final Clinical Impression(s) / ED Diagnoses Final diagnoses:  Hypertensive emergency    Rx / DC Orders ED Discharge Orders     None         Jakki Doughty, Gwenyth Allegra, MD 01/02/22 [phone]

## 2022-01-03 ENCOUNTER — Inpatient Hospital Stay (HOSPITAL_COMMUNITY): Payer: Medicare Other

## 2022-01-03 DIAGNOSIS — I10 Essential (primary) hypertension: Secondary | ICD-10-CM

## 2022-01-03 DIAGNOSIS — E039 Hypothyroidism, unspecified: Secondary | ICD-10-CM | POA: Diagnosis present

## 2022-01-03 DIAGNOSIS — Z930 Tracheostomy status: Secondary | ICD-10-CM | POA: Diagnosis not present

## 2022-01-03 DIAGNOSIS — N179 Acute kidney failure, unspecified: Secondary | ICD-10-CM | POA: Diagnosis present

## 2022-01-03 DIAGNOSIS — I248 Other forms of acute ischemic heart disease: Secondary | ICD-10-CM

## 2022-01-03 DIAGNOSIS — Z20822 Contact with and (suspected) exposure to covid-19: Secondary | ICD-10-CM | POA: Diagnosis present

## 2022-01-03 DIAGNOSIS — I161 Hypertensive emergency: Secondary | ICD-10-CM | POA: Diagnosis present

## 2022-01-03 DIAGNOSIS — R519 Headache, unspecified: Secondary | ICD-10-CM | POA: Diagnosis present

## 2022-01-03 DIAGNOSIS — I129 Hypertensive chronic kidney disease with stage 1 through stage 4 chronic kidney disease, or unspecified chronic kidney disease: Secondary | ICD-10-CM | POA: Diagnosis present

## 2022-01-03 DIAGNOSIS — D631 Anemia in chronic kidney disease: Secondary | ICD-10-CM | POA: Diagnosis present

## 2022-01-03 DIAGNOSIS — Z8673 Personal history of transient ischemic attack (TIA), and cerebral infarction without residual deficits: Secondary | ICD-10-CM | POA: Diagnosis not present

## 2022-01-03 DIAGNOSIS — Z800 Family history of malignant neoplasm of digestive organs: Secondary | ICD-10-CM | POA: Diagnosis not present

## 2022-01-03 DIAGNOSIS — I2510 Atherosclerotic heart disease of native coronary artery without angina pectoris: Secondary | ICD-10-CM | POA: Diagnosis present

## 2022-01-03 DIAGNOSIS — I959 Hypotension, unspecified: Secondary | ICD-10-CM | POA: Diagnosis present

## 2022-01-03 DIAGNOSIS — J449 Chronic obstructive pulmonary disease, unspecified: Secondary | ICD-10-CM | POA: Diagnosis present

## 2022-01-03 DIAGNOSIS — R778 Other specified abnormalities of plasma proteins: Secondary | ICD-10-CM | POA: Diagnosis not present

## 2022-01-03 DIAGNOSIS — Z8521 Personal history of malignant neoplasm of larynx: Secondary | ICD-10-CM | POA: Diagnosis not present

## 2022-01-03 DIAGNOSIS — E7800 Pure hypercholesterolemia, unspecified: Secondary | ICD-10-CM | POA: Diagnosis present

## 2022-01-03 DIAGNOSIS — F411 Generalized anxiety disorder: Secondary | ICD-10-CM | POA: Diagnosis present

## 2022-01-03 DIAGNOSIS — I160 Hypertensive urgency: Secondary | ICD-10-CM | POA: Diagnosis not present

## 2022-01-03 DIAGNOSIS — N184 Chronic kidney disease, stage 4 (severe): Secondary | ICD-10-CM | POA: Diagnosis present

## 2022-01-03 DIAGNOSIS — I252 Old myocardial infarction: Secondary | ICD-10-CM | POA: Diagnosis not present

## 2022-01-03 DIAGNOSIS — Z8542 Personal history of malignant neoplasm of other parts of uterus: Secondary | ICD-10-CM | POA: Diagnosis not present

## 2022-01-03 DIAGNOSIS — R4182 Altered mental status, unspecified: Secondary | ICD-10-CM | POA: Diagnosis not present

## 2022-01-03 DIAGNOSIS — R7989 Other specified abnormal findings of blood chemistry: Secondary | ICD-10-CM | POA: Diagnosis not present

## 2022-01-03 DIAGNOSIS — I480 Paroxysmal atrial fibrillation: Secondary | ICD-10-CM | POA: Diagnosis present

## 2022-01-03 DIAGNOSIS — I739 Peripheral vascular disease, unspecified: Secondary | ICD-10-CM | POA: Diagnosis present

## 2022-01-03 DIAGNOSIS — F32A Depression, unspecified: Secondary | ICD-10-CM | POA: Diagnosis present

## 2022-01-03 DIAGNOSIS — I701 Atherosclerosis of renal artery: Secondary | ICD-10-CM | POA: Diagnosis present

## 2022-01-03 DIAGNOSIS — I2489 Other forms of acute ischemic heart disease: Secondary | ICD-10-CM | POA: Insufficient documentation

## 2022-01-03 DIAGNOSIS — Z86718 Personal history of other venous thrombosis and embolism: Secondary | ICD-10-CM | POA: Diagnosis not present

## 2022-01-03 LAB — BASIC METABOLIC PANEL
Anion gap: 11 (ref 5–15)
BUN: 54 mg/dL — ABNORMAL HIGH (ref 8–23)
CO2: 21 mmol/L — ABNORMAL LOW (ref 22–32)
Calcium: 9.5 mg/dL (ref 8.9–10.3)
Chloride: 106 mmol/L (ref 98–111)
Creatinine, Ser: 2.68 mg/dL — ABNORMAL HIGH (ref 0.44–1.00)
GFR, Estimated: 18 mL/min — ABNORMAL LOW (ref >60)
Glucose, Bld: 142 mg/dL — ABNORMAL HIGH (ref 70–99)
Potassium: 4 mmol/L (ref 3.5–5.1)
Sodium: 138 mmol/L (ref 135–145)

## 2022-01-03 LAB — CBC
HCT: 30.6 % — ABNORMAL LOW (ref 36.0–46.0)
Hemoglobin: 9.9 g/dL — ABNORMAL LOW (ref 12.0–15.0)
MCH: 27.7 pg (ref 26.0–34.0)
MCHC: 32.4 g/dL (ref 30.0–36.0)
MCV: 85.5 fL (ref 80.0–100.0)
Platelets: 170 10*3/uL (ref 150–400)
RBC: 3.58 MIL/uL — ABNORMAL LOW (ref 3.87–5.11)
RDW: 13.7 % (ref 11.5–15.5)
WBC: 9.5 10*3/uL (ref 4.0–10.5)
nRBC: 0 % (ref 0.0–0.2)

## 2022-01-03 LAB — HEPARIN LEVEL (UNFRACTIONATED): Heparin Unfractionated: 0.35 IU/mL (ref 0.30–0.70)

## 2022-01-03 LAB — GLUCOSE, CAPILLARY: Glucose-Capillary: 156 mg/dL — ABNORMAL HIGH (ref 70–99)

## 2022-01-03 MED ORDER — LORAZEPAM 0.5 MG PO TABS: 0.5000 mg | ORAL_TABLET | Freq: Three times a day (TID) | ORAL | Status: DC | PRN

## 2022-01-03 MED ORDER — APIXABAN 2.5 MG PO TABS
2.5000 mg | ORAL_TABLET | Freq: Two times a day (BID) | ORAL | Status: DC
  Administered 2022-01-03 – 2022-01-08 (×11): 2.5 mg via ORAL
  Filled 2022-01-03 (×11): qty 1

## 2022-01-03 MED ORDER — CLEVIDIPINE BUTYRATE 0.5 MG/ML IV EMUL
0.0000 mg/h | INTRAVENOUS | Status: DC
  Administered 2022-01-03 – 2022-01-04 (×2): 1 mg/h via INTRAVENOUS
  Filled 2022-01-03 (×2): qty 50

## 2022-01-03 MED ORDER — HYDRALAZINE HCL 50 MG PO TABS
75.0000 mg | ORAL_TABLET | Freq: Three times a day (TID) | ORAL | Status: DC
  Administered 2022-01-03 – 2022-01-05 (×4): 75 mg via ORAL
  Filled 2022-01-03: qty 1
  Filled 2022-01-03 (×3): qty 2

## 2022-01-03 MED ORDER — LORAZEPAM 1 MG PO TABS: 1.0000 mg | ORAL_TABLET | Freq: Two times a day (BID) | ORAL | Status: DC

## 2022-01-03 MED ORDER — CHLORHEXIDINE GLUCONATE CLOTH 2 % EX PADS
6.0000 | MEDICATED_PAD | Freq: Every day | CUTANEOUS | Status: DC
  Administered 2022-01-03 – 2022-01-06 (×3): 6 via TOPICAL

## 2022-01-03 MED ORDER — LABETALOL HCL 5 MG/ML IV SOLN
10.0000 mg | INTRAVENOUS | Status: DC | PRN
  Administered 2022-01-03: 10 mg via INTRAVENOUS
  Filled 2022-01-03: qty 4

## 2022-01-03 MED ORDER — LORAZEPAM 2 MG/ML IJ SOLN
0.5000 mg | Freq: Once | INTRAMUSCULAR | Status: AC
  Administered 2022-01-03: 0.5 mg via INTRAVENOUS
  Filled 2022-01-03: qty 1

## 2022-01-03 MED ORDER — ACETAMINOPHEN 10 MG/ML IV SOLN
1000.0000 mg | Freq: Once | INTRAVENOUS | Status: AC
  Administered 2022-01-03: 1000 mg via INTRAVENOUS
  Filled 2022-01-03: qty 100

## 2022-01-03 MED ORDER — SODIUM CHLORIDE 0.9 % IV SOLN
12.5000 mg | Freq: Four times a day (QID) | INTRAVENOUS | Status: DC | PRN
  Filled 2022-01-03: qty 0.5

## 2022-01-03 MED ORDER — AMLODIPINE BESYLATE 10 MG PO TABS
10.0000 mg | ORAL_TABLET | Freq: Every day | ORAL | Status: DC
  Administered 2022-01-04 – 2022-01-08 (×5): 10 mg via ORAL
  Filled 2022-01-03 (×5): qty 1

## 2022-01-03 MED ORDER — LORAZEPAM 0.5 MG PO TABS
0.5000 mg | ORAL_TABLET | Freq: Two times a day (BID) | ORAL | Status: DC
  Administered 2022-01-03: 0.5 mg via ORAL
  Filled 2022-01-03: qty 1

## 2022-01-03 NOTE — Discharge Instructions (Addendum)
Informacin sobre mi medicamento - ELIQUIS (apixaban)  Esta educacin sobre medicamentos fue revisada conmigo o con mi representante de atencin mdica como parte de mi preparacin para el alta.  POR QU SE LE PRESCRIBI ELIQUIS? Eliquis se le recet para reducir el riesgo de que se formen cogulos de sangre que pueden causar un accidente cerebrovascular si tiene una afeccin mdica llamada fibrilacin auricular (un tipo de latido cardaco irregular) O para reducir el riesgo de que se formen cogulos de sangre despus de una ciruga ortopdica.  QU NECESITA SABER SOBRE ELIQUIS? Tome su Eliquis DOS VECES AL DA: una tableta por la maana y una tableta por la noche con o sin alimentos. Sera mejor tomar las dosis aproximadamente a la [employer].  Si tiene dificultad para tragar la tableta entera, hable con su farmacutico sobre cmo tomar el medicamento de [place] segura.  Tome Eliquis [employer] se lo recet su mdico y NO deje de tomar Eliquis sin hablar con el mdico que Cytogeneticist. Dejar de tomar puede aumentar su riesgo de desarrollar un nuevo cogulo o un accidente cerebrovascular. Renueve su receta antes de que se le acabe.  Despus del alta, debe tener citas de control peridicas con el proveedor de atencin mdica que le recet Eliquis. En el futuro, es posible que deba cambiar su dosis si su funcin renal o su peso [place] significativamente o a medida que envejece.  QU HACER SI OLVIDE UNA DOSIS? Si olvida una dosis, tmela tan pronto como lo recuerde Recruitment consultant y [place]. No tome ms de una dosis de [employer] al [employer].  INFORMACION DE SEGURIDAD IMPORTANTE Un posible efecto secundario de Eliquis es el sangrado. Debe llamar a su proveedor de atencin mdica de inmediato si experimenta alguno de los siguientes: ? Sangrado de una lesin o de la nariz que no se detiene. ? Orina de color inusual (rojo o marrn  oscuro) o heces de color inusual (rojo o negro). ? Moretones inusuales por razones desconocidas. ? Una cada grave o si se golpea la cabeza (aunque no haya sangrado).  Algunos medicamentos pueden interactuar con Eliquis y pueden aumentar su riesgo de sangrado o coagulacin mientras toma Eliquis. Para ayudar a evitar esto, consulte a su proveedor de atencin mdica o farmacutico antes de usar cualquier medicamento nuevo recetado o sin receta, incluidos los medicamentos a base de hierbas, las vitaminas, los medicamentos antiinflamatorios no esteroideos (AINE) y los suplementos.  Este sitio web tiene ms informacin sobre Eliquis (apixaban): http://www.eliquis.com/eliquis/home

## 2022-01-03 NOTE — Progress Notes (Signed)
Patient arrived to unit at this time with husband at bedside. Patient nonverbal with tracheal stoma with stent that is changed every 6 months. Patient spanish speaking. Not currently following commands. Arrived with tremors and vomiting. With SBP in 230's. Cleveprix started upon arrival. Skin intact. Skin assessed with Probation officer and Archivist. Patient is alert and does localize pain. Pure wick in place. Safety measures implemented. Will continue to monitor at this time.

## 2022-01-03 NOTE — Progress Notes (Signed)
OT Cancellation Note  Patient Details Name: [name] MRN: [id number] DOB: July 22, 1946   Cancelled Treatment:    Reason Eval/Treat Not Completed: Patient not medically ready.  Patient with headache and elevated SBP.  OT to continue efforts as appropriate.    [name] 01/03/2022, 4:08 PM

## 2022-01-03 NOTE — Progress Notes (Signed)
Pt is tearful shaking and restless on assessment by RN. Pt complaining of headache she is unable to rate. Upon assessment face is symmetrical , with purposeful movement in all extremities. Pt unable to grip hands and is unable to feel sensation in extremities. Pt not following commands to wiggle toes or move face.  MD notified  Rapid notified

## 2022-01-03 NOTE — Assessment & Plan Note (Addendum)
Depression: Resume lorazepam at half of home dose-continue duloxetine.

## 2022-01-03 NOTE — Progress Notes (Signed)
[place] for heparin Indication: chest pain/ACS  Allergies  Allergen Reactions   Xyzal [Levocetirizine Dihydrochloride] Itching   Augmentin [Amoxicillin-Pot Clavulanate] Diarrhea and Nausea And Vomiting   Tribenzor [Olmesartan-Amlodipine-Hctz] Other (See Comments)    "Hurt the kidneys"    Patient Measurements: Height: 5\' 4"  (162.6 cm) Weight: 56.1 kg (123 lb 10.9 oz) IBW/kg (Calculated) : 54.7  Vital Signs: Temp: 98 F (36.7 C) (01/21 0300) Temp Source: Oral (01/21 0300) BP: 193/76 (01/21 0700) Pulse Rate: 83 (01/21 0700)  Labs: Recent Labs    01/02/22 0034 01/02/22 0235 01/02/22 0811 01/02/22 1528 01/03/22 0036  HGB 10.5*  --  9.7*  --  9.9*  HCT 32.1*  --  29.9*  --  30.6*  PLT 221  --  180  --  170  HEPARINUNFRC  --   --   --  0.40 0.35  CREATININE 2.70*  --  2.67*  --   --   TROPONINIHS 1,457* 1,357*  --   --   --      Estimated Creatinine Clearance: 15.5 mL/min (A) (by C-G formula based on SCr of 2.67 mg/dL (H)).   Medical History: Past Medical History:  Diagnosis Date   Anemia    Anxiety    takes Ativan prn   Blood transfusion without reported diagnosis 09/15/12   2 units Prbc's   Broken ribs    Chronic back pain    CKD (chronic kidney disease), stage IV (HCC)    Constipation    related to pain meds   COPD (chronic obstructive pulmonary disease) ([place]) 08/10/2012   denies   Depression    Gastrostomy in place [employer])    removed   GERD (gastroesophageal reflux disease)    takes Zantac daily   Headache(784.0)    Hiatal hernia 08/10/2012   History of radiation therapy 10/17/12-11/25/12   supraglottic larynx,high risk neck tumor bed 5880 cGy/28 sessions, high risk lymph node tumor bed 5600 cGy/20 sessions, mod risk lymph node tumor bed 5040 cGy/20 sessions   Hypercholesteremia    takes Pravastatin daily   Hypertension    takes Tribenzor and Atenolol daily   Insomnia    takes Amitriptyline daily   Nausea     takes Zofran prn   PAD (peripheral artery disease) ([place])    noninvasive imaging in 2016   Pneumonia    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 08/08/2012   Shortness of breath dyspnea    Stroke [facility]) 2011   denies. no residual   Uterine cancer [facility])     Assessment: 76yo female c/o sx related to hypertension, troponin found to be elevated, to begin heparin; of note pt was previously on Eliquis x53mo for distal DVT, no longer taking (she is also noted with afib).   Heparin level came back therapeutic at 0.35, on 800 units/hr. Hgb 9.9, plt 170. No s/sx of bleeding or infusion issues.   Goal of Therapy:  Heparin level 0.3-0.7 units/ml Monitor platelets by anticoagulation protocol: Yes   Plan:  -Continue heparin 800 units/hr -Daily heparin level and CBC  Antonietta Jewel, PharmD, [place] Pharmacist  Phone: [phone] 01/03/2022 7:18 AM  Please check AMION for all [place] phone numbers After 10:00 PM, call [place] to change from heparin infusion to apixaban given Afib. Given Scr >1.5 and wt <60 kg, will start apixaban 2.5 mg BID. Stop heparin at the time of apixaban start. Will educate prior to discharge.  Antonietta Jewel, PharmD, [place] Clinical Pharmacist

## 2022-01-03 NOTE — TOC Progression Note (Addendum)
Transition of Care [facility]) - Progression Note    Patient Details  Name: [name] MRN: [id number] Date of Birth: 05-Mar-1946  Transition of Care [facility]) CM/SW Contact  Zenon Mayo, RN Phone Number: 01/03/2022, 12:45 PM  Clinical Narrative:    Per Staff RN Sarah patient is forgetful about taking her meds , may benefit from [facility].  Please consult TOC for needs.   Expected Discharge Plan: [place] Barriers to Discharge: Continued Medical Work up  Expected Discharge Plan and Services Expected Discharge Plan: [place] In-house Referral: NA Discharge Planning Services: CM Consult Post Acute Care Choice: Home Health                   DME Arranged: N/A DME Agency: NA       HH Arranged: RN           Social Determinants of Health (SDOH) Interventions    Readmission Risk Interventions Readmission Risk Prevention Plan 01/16/2021 01/02/2021  Transportation Screening Complete Complete  PCP or Specialist Appt within 3-5 Days Complete Complete  HRI or [place] Complete Complete  Social Work Consult for [place] Planning/Counseling Complete Complete  Palliative Care Screening Not Applicable Not Applicable  Medication Review Press photographer) Complete Complete  Some recent data might be hidden

## 2022-01-03 NOTE — Progress Notes (Signed)
Reassessed patient. Compared to earlier-much more calmer significantly less anxious.  Just completed IV Tylenol-still has a headache.  She is still nauseous-and has refused her oral antihypertensives. She is able to follow simple commands-whispers and talks to me. BP is still elevated-around 388 systolic range.  Suspect headache/vomiting is due to uncontrolled hypertension-we will push IV labetalol and see if this improves her BP/headache.  Continue antiemetics for headache.  Given inability to take oral intake-very labile BP-suspect she may benefit from labetalol/antihypertensive medication infusion.  Will ask PCCM to evaluate.

## 2022-01-03 NOTE — Progress Notes (Addendum)
PROGRESS NOTE        PATIENT DETAILS Name: [name] Age: 77 y.o. Sex: female Date of Birth: 11-11-1946 Admit Date: 01/01/2022 Admitting Physician Kristopher Oppenheim, DO YSA:[id number], Real Cons, MD  Brief Narrative: Patient is a 76 y.o. female with history of head/neck cancer-s/p laryngectomy-tracheostomy in place-history of CAD (non-STEMI January 2022-managed medically), CKD stage IV who presented with hypertensive emergency and elevated troponins.  See below for further details  Subjective: Lying comfortably in bed-denies any chest pain or shortness of breath.  Does not verbalize due to laryngectomy-no family at bedside.  Shakes head yes or no.  Objective: Vitals: Blood pressure (!) 170/67, pulse 84, temperature 97.6 F (36.4 C), temperature source Axillary, resp. rate 20, height 5\' 4"  (1.626 m), weight 56.1 kg, SpO2 93 %.   Exam: Gen Exam:Alert awake-not in any distress HEENT:atraumatic, normocephalic Chest: B/L clear to auscultation anteriorly CVS:S1S2 regular Abdomen:soft non tender, non distended Extremities:no edema Neurology: Non focal Skin: no rash  Pertinent Labs/Radiology: CBC Latest Ref Rng & Units 01/03/2022 01/02/2022 01/02/2022  WBC 4.0 - 10.5 K/uL 9.5 9.3 10.6(H)  Hemoglobin 12.0 - 15.0 g/dL 9.9(L) 9.7(L) 10.5(L)  Hematocrit 36.0 - 46.0 % 30.6(L) 29.9(L) 32.1(L)  Platelets 150 - 400 K/uL 170 180 221    Lab Results  Component Value Date   NA 138 01/03/2022   K 4.0 01/03/2022   CL 106 01/03/2022   CO2 21 (L) 01/03/2022      Assessment/Plan: * Malignant hypertension- (present on admission) Labile BP with significant variations-increased amlodipine to 10 mg daily, increased hydralazine to 75 mg 3 times daily-continue Coreg.  Avoid aggressive reduction of BP.  Elevated troponin due to demand ischemia- (present on admission) Likely demand ischemia-echo with stable EF-continue to manage medically.   PAF (paroxysmal atrial  fibrillation)-CHADS2 Vas score of 5- (present on admission) Maintaining sinus rhythm-initially on IV heparin-cardiology now recommending transitioning to Eliquis.    [name]  COPD (chronic obstructive pulmonary disease) with chronic bronchitis ([place])- (present on admission) Stable-continue with bronchodilators.  History of head and neck cancer-s/p laryngectomy-with tracheostomy Chronic issue-stable for outpatient follow-up with ENT/oncology.  CKD stage 4 secondary to hypertension ([place])- (present on admission) Close to baseline-follow electrolytes closely-avoid nephrotoxic agents.  S/P laryngectomy Chronic.  Hypothyroidism- (present on admission) Continue levothyroxine.  GAD (generalized anxiety disorder)- (present on admission) Resume lorazepam at half of home dose-continue duloxetine.   BMI: Estimated body mass index is 21.23 kg/m as calculated from the following:   Height as of this encounter: 5\' 4"  (1.626 m).   Weight as of this encounter: 56.1 kg.    DVT Prophylaxis: IV Heparin Procedures: None Consults: Cardiology Code Status:Full code Family Communication: Son at bedside.   Disposition Plan: Status is: Observation  The patient will require care spanning > 2 midnights and should be moved to inpatient because: Non-STEMI/hypertensive emergency-awaiting echo and further cardiology evaluation.  Not yet stable for discharge.   Diet: Diet Order             Diet Heart Room service appropriate? Yes; Fluid consistency: Thin  Diet effective now                     Antimicrobial agents: Anti-infectives (From admission, onward)    None        MEDICATIONS: Scheduled Meds:  [START ON 01/04/2022] amLODipine  10 mg  Oral Daily   apixaban  2.5 mg Oral BID   aspirin EC  81 mg Oral Daily   carvedilol  12.5 mg Oral BID WC   DULoxetine  20 mg Oral Daily   hydrALAZINE  75 mg Oral Q8H   levothyroxine  50 mcg Oral QAC breakfast   LORazepam  1 mg Oral BID   mirtazapine   15 mg Oral QHS   rosuvastatin  20 mg Oral Daily   Continuous Infusions:   PRN Meds:.acetaminophen **OR** acetaminophen, hydrALAZINE, LORazepam, ondansetron **OR** ondansetron (ZOFRAN) IV   I have personally reviewed following labs and imaging studies  LABORATORY DATA: CBC: Recent Labs  Lab 01/02/22 0034 01/02/22 0811 01/03/22 0036  WBC 10.6* 9.3 9.5  NEUTROABS 7.4 6.1  --   HGB 10.5* 9.7* 9.9*  HCT 32.1* 29.9* 30.6*  MCV 87.5 89.0 85.5  PLT 221 180 673    Basic Metabolic Panel: Recent Labs  Lab 01/02/22 0034 01/02/22 0811 01/03/22 0740  NA 140 139 138  K 5.0 4.4 4.0  CL 106 109 106  CO2 23 19* 21*  GLUCOSE 116* 110* 142*  BUN 66* 61* 54*  CREATININE 2.70* 2.67* 2.68*  CALCIUM 9.1 8.9 9.5  MG  --  1.7  --     GFR: Estimated Creatinine Clearance: 15.4 mL/min (A) (by C-G formula based on SCr of 2.68 mg/dL (H)).  Liver Function Tests: Recent Labs  Lab 01/02/22 0034 01/02/22 0811  AST 34 19  ALT 14 13  ALKPHOS 104 88  BILITOT 0.9 0.2*  PROT 5.9* 5.3*  ALBUMIN 3.1* 2.8*   No results for input(s): LIPASE, AMYLASE in the last 168 hours. No results for input(s): AMMONIA in the last 168 hours.  Coagulation Profile: No results for input(s): INR, PROTIME in the last 168 hours.  Cardiac Enzymes: No results for input(s): CKTOTAL, CKMB, CKMBINDEX, TROPONINI in the last 168 hours.  BNP (last 3 results) No results for input(s): PROBNP in the last 8760 hours.  Lipid Profile: No results for input(s): CHOL, HDL, LDLCALC, TRIG, CHOLHDL, LDLDIRECT in the last 72 hours.  Thyroid Function Tests: Recent Labs    01/02/22 1528  TSH 3.283    Anemia Panel: No results for input(s): VITAMINB12, FOLATE, FERRITIN, TIBC, IRON, RETICCTPCT in the last 72 hours.  Urine analysis:    Component Value Date/Time   COLORURINE YELLOW 08/11/2021 [place] 08/11/2021 0942   LABSPEC 1.020 08/11/2021 0942   PHURINE 6.0 08/11/2021 0942   GLUCOSEU NEGATIVE  08/11/2021 0942   HGBUR NEGATIVE 08/11/2021 0942   BILIRUBINUR NEGATIVE 08/11/2021 0942   BILIRUBINUR Negative 01/19/2019 [place] 08/11/2021 0942   PROTEINUR 30 (A) 01/13/2021 2312   UROBILINOGEN 0.2 08/11/2021 0942   NITRITE NEGATIVE 08/11/2021 0942   LEUKOCYTESUR NEGATIVE 08/11/2021 0942    Sepsis Labs: Lactic Acid, Venous    Component Value Date/Time   LATICACIDVEN 1.99 10/29/2015 2331    MICROBIOLOGY: Recent Results (from the past 240 hour(s))  Resp Panel by RT-PCR (Flu A&B, Covid) Nasopharyngeal Swab     Status: None   Collection Time: 01/02/22 12:32 AM   Specimen: Nasopharyngeal Swab; Nasopharyngeal(NP) swabs in vial transport medium  Result Value Ref Range Status   SARS Coronavirus 2 by RT PCR NEGATIVE NEGATIVE Final    Comment: (NOTE) SARS-CoV-2 target nucleic acids are NOT DETECTED.  The SARS-CoV-2 RNA is generally detectable in upper respiratory specimens during the acute phase of infection. The lowest concentration of SARS-CoV-2 viral copies  this assay can detect is 138 copies/mL. A negative result does not preclude SARS-Cov-2 infection and should not be used as the sole basis for treatment or other patient management decisions. A negative result may occur with  improper specimen collection/handling, submission of specimen other than nasopharyngeal swab, presence of viral mutation(s) within the areas targeted by this assay, and inadequate number of viral copies(<138 copies/mL). A negative result must be combined with clinical observations, patient history, and epidemiological information. The expected result is Negative.  Fact Sheet for Patients:  EntrepreneurPulse.com.au  Fact Sheet for Healthcare Providers:  IncredibleEmployment.be  This test is no t yet approved or cleared by the [place] FDA and  has been authorized for detection and/or diagnosis of SARS-CoV-2 by FDA under an Emergency Use  Authorization (EUA). This EUA will remain  in effect (meaning this test can be used) for the duration of the COVID-19 declaration under Section 564(b)(1) of the Act, 21 U.S.C.section 360bbb-3(b)(1), unless the authorization is terminated  or revoked sooner.       Influenza A by PCR NEGATIVE NEGATIVE Final   Influenza B by PCR NEGATIVE NEGATIVE Final    Comment: (NOTE) The Xpert Xpress SARS-CoV-2/FLU/RSV plus assay is intended as an aid in the diagnosis of influenza from Nasopharyngeal swab specimens and should not be used as a sole basis for treatment. Nasal washings and aspirates are unacceptable for Xpert Xpress SARS-CoV-2/FLU/RSV testing.  Fact Sheet for Patients: EntrepreneurPulse.com.au  Fact Sheet for Healthcare Providers: IncredibleEmployment.be  This test is not yet approved or cleared by the [place] FDA and has been authorized for detection and/or diagnosis of SARS-CoV-2 by FDA under an Emergency Use Authorization (EUA). This EUA will remain in effect (meaning this test can be used) for the duration of the COVID-19 declaration under Section 564(b)(1) of the Act, 21 U.S.C. section 360bbb-3(b)(1), unless the authorization is terminated or revoked.  Performed at [facility], [place] [address], [place], [place] [address]   MRSA Next Gen by PCR, Nasal     Status: None   Collection Time: 01/02/22  2:51 PM   Specimen: Nasal Mucosa; Nasal Swab  Result Value Ref Range Status   MRSA by PCR Next Gen NOT DETECTED NOT DETECTED Final    Comment: (NOTE) The GeneXpert MRSA Assay (FDA approved for NASAL specimens only), is one component of a comprehensive MRSA colonization surveillance program. It is not intended to diagnose MRSA infection nor to guide or monitor treatment for MRSA infections. Test performance is not FDA approved in patients less than 9 years old. Performed at [facility], [place] [address], [place],   [address]     RADIOLOGY STUDIES/RESULTS: CT HEAD WO CONTRAST (5MM)  Result Date: 01/02/2022 CLINICAL DATA:  Altered mental status EXAM: CT HEAD WITHOUT CONTRAST TECHNIQUE: Contiguous axial images were obtained from the base of the skull through the vertex without intravenous contrast. RADIATION DOSE REDUCTION: This exam was performed according to the departmental dose-optimization program which includes automated exposure control, adjustment of the mA and/or kV according to patient size and/or use of iterative reconstruction technique. COMPARISON:  08/14/2021 FINDINGS: Brain: No evidence of acute infarction, hemorrhage, hydrocephalus, extra-axial collection or mass lesion/mass effect. Mild atrophic changes are noted. Vascular: No hyperdense vessel or unexpected calcification. Skull: Normal. Negative for fracture or focal lesion. Sinuses/Orbits: No acute finding. Other: None. IMPRESSION: Mild atrophic changes without acute abnormality. Electronically Signed   By: Inez Catalina M.D.   On: 01/02/2022 01:29   DG Chest  Port 1 View  Result Date: 01/02/2022 CLINICAL DATA:  Headache and periorbital edema. EXAM: PORTABLE CHEST 1 VIEW COMPARISON:  December 30, 2021 FINDINGS: The heart size and mediastinal contours are within normal limits. There is mild to moderate severity calcification of the aortic arch. Both lungs are clear. The visualized skeletal structures are unremarkable. IMPRESSION: No active disease. Electronically Signed   By: Virgina Norfolk M.D.   On: 01/02/2022 00:46   ECHOCARDIOGRAM COMPLETE  Result Date: 01/02/2022    ECHOCARDIOGRAM REPORT   Patient Name:   [name] Date of Exam: 01/02/2022 Medical Rec #:  [id number]        Height:       64.0 in Accession #:    [id number]       Weight:       120.0 lb Date of Birth:  13-Mar-1946       BSA:          1.575 m Patient Age:    78 years         BP:           197/77 mmHg Patient Gender: F                HR:           78 bpm. Exam Location:   Inpatient Procedure: 2D Echo, 3D Echo, Cardiac Doppler, Color Doppler and Strain Analysis Indications:    NSTEMI I21.4  History:        Patient has prior history of Echocardiogram examinations, most                 recent 01/15/2021. COPD; Risk Factors:Hypertension and Former                 Smoker. Chronic kidney disease. Past history of cancer. History                 of laryngectomy.  Sonographer:    Darlina Sicilian RDCS Referring Phys: [phone] ERIC CHEN  Sonographer Comments: Global longitudinal strain was attempted. IMPRESSIONS  1. Left ventricular ejection fraction, by estimation, is 60 to 65%. The left ventricle has normal function. The left ventricle has no regional wall motion abnormalities. There is mild concentric left ventricular hypertrophy. Left ventricular diastolic parameters are consistent with Grade I diastolic dysfunction (impaired relaxation).  2. Right ventricular systolic function is normal. The right ventricular size is normal. Tricuspid regurgitation signal is inadequate for assessing PA pressure.  3. Left atrial size was moderately dilated.  4. The mitral valve is grossly normal. No evidence of mitral valve regurgitation.  5. The aortic valve is grossly normal. Aortic valve regurgitation is not visualized. Comparison(s): No significant change from prior study. FINDINGS  Left Ventricle: Left ventricular ejection fraction, by estimation, is 60 to 65%. The left ventricle has normal function. The left ventricle has no regional wall motion abnormalities. The left ventricular internal cavity size was normal in size. There is  mild concentric left ventricular hypertrophy. Left ventricular diastolic parameters are consistent with Grade I diastolic dysfunction (impaired relaxation). Right Ventricle: The right ventricular size is normal. No increase in right ventricular wall thickness. Right ventricular systolic function is normal. Tricuspid regurgitation signal is inadequate for assessing PA pressure. Left  Atrium: Left atrial size was moderately dilated. Right Atrium: Right atrial size was normal in size. Pericardium: There is no evidence of pericardial effusion. Mitral Valve: The mitral valve is grossly normal. There is moderate thickening of the mitral valve leaflet(s). There is  mild calcification of the mitral valve leaflet(s). No evidence of mitral valve regurgitation. Tricuspid Valve: The tricuspid valve is not well visualized. Tricuspid valve regurgitation is not demonstrated. Aortic Valve: The aortic valve is grossly normal. Aortic valve regurgitation is not visualized. Pulmonic Valve: The pulmonic valve was not well visualized. Pulmonic valve regurgitation is not visualized. Aorta: The aortic root and ascending aorta are structurally normal, with no evidence of dilitation. IAS/Shunts: The interatrial septum was not well visualized.  LEFT VENTRICLE PLAX 2D LVIDd:         4.15 cm   Diastology LVIDs:         1.90 cm   LV e' medial:    3.21 cm/s LV PW:         1.05 cm   LV E/e' medial:  16.9 LV IVS:        1.00 cm   LV e' lateral:   4.46 cm/s LVOT diam:     1.80 cm   LV E/e' lateral: 12.1 LVOT Area:     2.54 cm                           3D Volume EF:                          3D EF:        55 %                          LV EDV:       129 ml                          LV ESV:       59 ml                          LV SV:        71 ml RIGHT VENTRICLE RV S prime:     11.90 cm/s TAPSE (M-mode): 1.5 cm LEFT ATRIUM           Index        RIGHT ATRIUM           Index LA diam:      2.80 cm 1.78 cm/m   RA Area:     11.30 cm LA Vol (A2C): 62.3 ml 39.57 ml/m  RA Volume:   26.00 ml  16.51 ml/m LA Vol (A4C): 52.1 ml 33.09 ml/m   AORTA Ao Root diam: 2.90 cm Ao Asc diam:  3.20 cm MITRAL VALVE MV Area (PHT): 2.86 cm     SHUNTS MV Decel Time: 265 msec     Systemic Diam: 1.80 cm MV E velocity: 54.10 cm/s MV A velocity: 104.00 cm/s MV E/A ratio:  0.52 Landscape architect signed by Phineas Inches Signature Date/Time:  01/02/2022/12:42:05 PM    Final      LOS: 0 days   Oren Binet, MD  [facility]    To contact the attending provider between 7A-7P or the covering provider during after hours 7P-7A, please log into the web site [email or web address] and access using universal [place] password for that web site. If you do not have the password, please call the hospital operator.  01/03/2022, 3:49 PM

## 2022-01-03 NOTE — Progress Notes (Signed)
Informed by nurse that patient complaining of headache.  On arrival-appears very anxious, tearful-but able to speak softly.  Family member on the phone. Neurologically intact/no focal deficit.  BP elevated to 461 systolic range again.  Plan IV Ativan 0.5 mg x 1 (have already restarted Ativan but at half of home dosing) Tylenol for headache If BP remains elevated-May need further adjustment of her hypertensive regimen. Will increase lorazepam to home regimen of 1 mg twice daily.

## 2022-01-03 NOTE — Consult Note (Signed)
NAME:  [name], MRN:  [id number], DOB:  09/17/1946, LOS: 0 ADMISSION DATE:  01/01/2022, CONSULTATION DATE:  01/03/2022 REFERRING MD:  Dr. Sloan Leiter, CHIEF COMPLAINT:  Resistant hypertension   History of Present Illness:  [name] is a 76 y.o. female with an extensive PMH including but not limited to head and neck cancer s/p total laryngectomy and CKD stage IV (see foll HX listed below) that presented to the ED 1/20 for headache and periorbital edema. Per family blood pressure was extremity elevated at home prior to ED arrival.    Patient was admitted per [facility] for treatment of hypertension management   PCCM  called evening of 1/21 after patient began to vomit with rebound hypertension. Given labile blood pressure with IV pushes of antihypertensives PCCM was called to assist in transfer to ICU and initiation of continuous drip for BP control   Pertinent  Medical History  Head and neck cancer s/p laryngectomy  CKD stage IV HTN  HLD  PAD Stroke 2011 COPD  Depression   Significant Hospital Events: Including procedures, antibiotic start and stop dates in addition to other pertinent events   1/21 PCCM consulted for transfer to ICU to initiate IV Cleviprix   Interim History / Subjective:  Sleepy post Ativan   Son at bedside  Unable to obtain subjective evaluation due to patient status  Objective   Blood pressure (!) 215/82, pulse 85, temperature 98.3 F (36.8 C), resp. rate 20, height 5\' 4"  (1.626 m), weight 56.1 kg, SpO2 94 %.        Intake/Output Summary (Last 24 hours) at 01/03/2022 1945 Last data filed at 01/03/2022 0300 Gross per 24 hour  Intake 557.24 ml  Output 150 ml  Net 407.24 ml   Filed Weights   01/01/22 2336 01/03/22 0425  Weight: 54.4 kg 56.1 kg    Examination: General: Acute on chronically ill appearing elderly female lying in bed, appears uncomfartable HEENT: [place]/ATMM pink/moist, s/p laryngectomy  Neuro: Sleepy but will open eyes to verbal  stimuli, son states she is confused since Ativan, MAE, PERRL 22mm  CV: s1s2 regular rate and rhythm, no murmur, rubs, or gallops,  PULM:  Clear to ascultation bilateral, no increased work if breathing, no added breath sounds GI: soft, bowel sounds active in all 4 quadrants, non-tender, non-distended Extremities: warm/dry, no edema, cap refill less than 3 seconda  Skin: no rashes or lesions  HGB 9.7> 9.9 WBC Wnl Creat 2.67>2.68 Bun 61>54 1/20 head ct without acute changes 1/20 CXR no infiltrate, pneumo, or effusion seen  Resolved Hospital Problem list     Assessment & Plan:  Hypertensive emergency, secondary to maliginant hypertension Elevated troponin, suspect secondary to demand ischmia Headache, suspect secondary to hypertension Emesis, suspect secondary to hypertension hypertensive encephalopathy vs medication related present on exam. Received 0.5mg  IV ativan x1. Neuro exam changed s/p ativan administration per son. Baseline SBP per son in 160's. ECHO 01/02/22 LVED 60-65%. No WMA. Grade I DD. RVSF WNL Plan -Admit to ICU, with continuous tele and SpO2 monitoring. -Blood pressure control with cleviprex gtt.Goal decrease by 20% in the first two hours to SBP 190-200, followed by 20% decrease to 170-180 over the next 2-6 hours. Titrate IV to goal.  -Repeat EKG -Repeat head CT -Monitor neuro exam closely -Notify for concern regarding pain, agitation, volume overload, or withdrawal -Cardiology consulted. Continue carvedilol 12.5 Bid and Hydralazine 75 TID if tolerating PO intake. Resume amlodipine once off cleviprex. -Tylenol for headache. Hold on sedating agents  HX PAF HX DVT CHADS2 VAS score 5, in NSR on exam. Transitioned from heparin GTT to eliquis on 1/21 per cardiology -Continue eliquis pending head CT -Continue carvedilol  CKD stage 4, secondary to HTN -Ensure renal perfusion. Goal MAP 65 or greater. -Avoid neprotoxic drugs as possible. -Strict I&O's -Follow up AM  creatinine  COPD with chronic bronchitis HX head and neck cancer s/p laryngectomy On room air -Outpatient ENT/ONC follow up   Hypothyroidism -Continue synthroid   GAD S/P IV ativan -Hold ativan and duloxetine overnight due to decreased mental status   Best Practice (right click and "Reselect all SmartList Selections" daily)   Diet/type: NPO w/ oral meds DVT prophylaxis: DOAC GI prophylaxis: PPI Lines: N/A Foley:  N/A Code Status:  full code Last date of multidisciplinary goals of care discussion [Pending]  Labs   CBC: Recent Labs  Lab 01/02/22 0034 01/02/22 0811 01/03/22 0036  WBC 10.6* 9.3 9.5  NEUTROABS 7.4 6.1  --   HGB 10.5* 9.7* 9.9*  HCT 32.1* 29.9* 30.6*  MCV 87.5 89.0 85.5  PLT 221 180 631    Basic Metabolic Panel: Recent Labs  Lab 01/02/22 0034 01/02/22 0811 01/03/22 0740  NA 140 139 138  K 5.0 4.4 4.0  CL 106 109 106  CO2 23 19* 21*  GLUCOSE 116* 110* 142*  BUN 66* 61* 54*  CREATININE 2.70* 2.67* 2.68*  CALCIUM 9.1 8.9 9.5  MG  --  1.7  --    GFR: Estimated Creatinine Clearance: 15.4 mL/min (A) (by C-G formula based on SCr of 2.68 mg/dL (H)). Recent Labs  Lab 01/02/22 0034 01/02/22 0811 01/03/22 0036  WBC 10.6* 9.3 9.5    Liver Function Tests: Recent Labs  Lab 01/02/22 0034 01/02/22 0811  AST 34 19  ALT 14 13  ALKPHOS 104 88  BILITOT 0.9 0.2*  PROT 5.9* 5.3*  ALBUMIN 3.1* 2.8*   No results for input(s): LIPASE, AMYLASE in the last 168 hours. No results for input(s): AMMONIA in the last 168 hours.  ABG    Component Value Date/Time   TCO2 23 09/27/2012 1409     Coagulation Profile: No results for input(s): INR, PROTIME in the last 168 hours.  Cardiac Enzymes: No results for input(s): CKTOTAL, CKMB, CKMBINDEX, TROPONINI in the last 168 hours.  HbA1C: Hgb A1c MFr Bld  Date/Time Value Ref Range Status  12/27/2020 09:11 AM 5.2 4.8 - 5.6 % Final    Comment:    (NOTE) Pre diabetes:          5.7%-6.4%  Diabetes:               >6.4%  Glycemic control for   <7.0% adults with diabetes   08/08/2019 04:14 PM 5.8 4.6 - 6.5 % Final    Comment:    Glycemic Control Guidelines for People with Diabetes:Non Diabetic:  <6%Goal of Therapy: <7%Additional Action Suggested:  >8%     CBG: No results for input(s): GLUCAP in the last 168 hours.  Review of Systems:   Unable to obtain a ROS due to patient status  Past Medical History:  She,  has a past medical history of Anemia, Anxiety, Blood transfusion without reported diagnosis (09/15/12), Broken ribs, Chronic back pain, CKD (chronic kidney disease), stage IV ([place]), Constipation, COPD (chronic obstructive pulmonary disease) ([place]) (08/10/2012), Depression, Gastrostomy in place [facility]), GERD (gastroesophageal reflux disease), Headache(784.0), Hiatal hernia (08/10/2012), History of radiation therapy (10/17/12-11/25/12), Hypercholesteremia, Hypertension, Insomnia, Nausea, PAD (peripheral artery disease) ([place]), Pneumonia, 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   Surgical History:   Past Surgical History:  Procedure Laterality Date   ABDOMINAL SURGERY     r/t uterine carcinoma   APPENDECTOMY     DIRECT LARYNGOSCOPY N/A 05/22/2014   Procedure: DIRECT LARYNGOSCOPY WITH ESOPHAGEAL DILATION;  Surgeon: Jerrell Belfast, MD;  Location: [facility] OR;  Service: ENT;  Laterality: N/A;   ESOPHAGOSCOPY WITH DILITATION N/A 05/29/2015   Procedure: ESOPHAGOSCOPY WITH ESOPHAGEAL DILITATION;  Surgeon: Jerrell Belfast, MD;  Location: [place];  Service: ENT;  Laterality: N/A;   Gastrostomy Tube removed   2013   GASTROSTOMY W/ FEEDING TUBE  12   HERNIA REPAIR     child   LARYNGETOMY  08/31/2012   Procedure: LARYNGECTOMY;  Surgeon: Jerrell Belfast, MD;  Location: [place];  Service: ENT;  Laterality: N/A;   LARYNGOSCOPY  08/10/2012   Procedure: LARYNGOSCOPY;  Surgeon: Jerrell Belfast, MD;  Location: [facility];   Service: ENT;  Laterality: N/A;  with biopsy   RADICAL NECK DISSECTION  08/31/2012   Procedure: RADICAL NECK DISSECTION;  Surgeon: Jerrell Belfast, MD;  Location: [place];  Service: ENT;  Laterality: Bilateral;   TRACHEAL ESOPHOGEAL PUNCTURE WITH REPAIR STOMA N/A 09/08/2013   Procedure: TRACHEAL ESOPHOGEAL PUNCTURE WITH PLACEMENT OF  PROVOX PROSTHESIS ;  Surgeon: Jerrell Belfast, MD;  Location: [place];  Service: ENT;  Laterality: N/A;   TRACHEOSTOMY TUBE PLACEMENT  08/10/2012   Procedure: TRACHEOSTOMY;  Surgeon: Jerrell Belfast, MD;  Location: [facility];  Service: ENT;  Laterality: N/A;     Social History:   reports that she quit smoking about 8 years ago. Her smoking use included cigarettes. She has a 12.50 pack-year smoking history. She has never used smokeless tobacco. She reports current alcohol use. She reports that she does not use drugs.   Family History:  Her family history includes Brain cancer in her brother; Throat cancer in her father. There is no history of CAD.   Allergies Allergies  Allergen Reactions   Xyzal [Levocetirizine Dihydrochloride] Itching   Augmentin [Amoxicillin-Pot Clavulanate] Diarrhea and Nausea And Vomiting   Tribenzor [Olmesartan-Amlodipine-Hctz] Other (See Comments)    "Hurt the kidneys"     Home Medications  Prior to Admission medications   Medication Sig Start Date End Date Taking? Authorizing Provider  Baclofen 5 MG TABS Take 5 mg by mouth 2 (two) times daily as needed. Patient taking differently: Take 5 mg by mouth 2 (two) times daily as needed (pain). 12/30/21  Yes Hoyt Koch, MD  cilostazol (PLETAL) 100 MG tablet Take 1 tablet (100 mg total) by mouth 2 (two) times daily. 04/07/21  Yes Hoyt Koch, MD  DULoxetine (CYMBALTA) 20 MG capsule Take 1 capsule (20 mg total) by mouth daily. 07/11/21  Yes Hoyt Koch, MD  Ensure (ENSURE) Take 237 mLs by mouth daily.   Yes [provider]  hydrALAZINE (APRESOLINE) 50 MG tablet  TAKE 1.5 TABLETS (75 MG TOTAL) BY MOUTH 3 (THREE) TIMES DAILY. Patient taking differently: Take 50 mg by mouth 2 (two) times daily. 09/05/21  Yes Hoyt Koch, MD  isosorbide mononitrate (IMDUR) 60 MG 24 hr tablet Take 1 tablet (60 mg total) by mouth daily. 01/28/21  Yes Imogene Burn, PA-C  levothyroxine (SYNTHROID) 50 MCG tablet TAKE 1 TABLET BY MOUTH DAILY BEFORE BREAKFAST Patient taking differently: Take 50 mcg by mouth daily before breakfast. 03/14/21  Yes Hoyt Koch, MD  loperamide (  IMODIUM A-D) 2 MG tablet Take 2 mg by mouth 4 (four) times daily as needed for diarrhea or loose stools.   Yes [provider]  LORazepam (ATIVAN) 1 MG tablet TAKE 1 TABLET BY MOUTH TWICE A DAY Patient taking differently: Take 1 mg by mouth 2 (two) times daily. 11/28/21  Yes Hoyt Koch, MD  metoprolol tartrate (LOPRESSOR) 50 MG tablet Take 1 tablet (50 mg total) by mouth 2 (two) times daily. 04/07/21  Yes Hoyt Koch, MD  mirtazapine (REMERON) 15 MG tablet Take 15 mg by mouth at bedtime.   Yes [provider]  ondansetron (ZOFRAN) 4 MG tablet Take 1 tablet (4 mg total) by mouth every 8 (eight) hours as needed for nausea or vomiting. 12/30/21  Yes Hoyt Koch, MD  rosuvastatin (CRESTOR) 20 MG tablet Take 1 tablet (20 mg total) by mouth daily. 04/07/21  Yes Hoyt Koch, MD  benzonatate (TESSALON PERLES) 100 MG capsule 1-2 capsules up to twice daily as needed for cough Patient not taking: Reported on 01/02/2022 12/23/21   Lucretia Kern, DO  doxycycline (VIBRA-TABS) 100 MG tablet Take 1 tablet (100 mg total) by mouth 2 (two) times daily. Patient not taking: Reported on 01/02/2022 12/23/21   Lucretia Kern, DO  fluticasone [facility]) 50 MCG/ACT nasal spray SPRAY 2 SPRAYS INTO EACH NOSTRIL EVERY DAY Patient not taking: Reported on 01/02/2022 03/03/21   Janith Lima, MD  Olmesartan-amLODIPine-HCTZ 20-5-12.5 MG TABS Take 1 tablet by mouth  daily. Patient not taking: Reported on 01/02/2022 04/07/21   Hoyt Koch, MD  predniSONE (DELTASONE) 20 MG tablet Take 2 tablets daily with breakfast. Patient not taking: Reported on 01/02/2022 11/26/21   Jaynee Eagles, PA-C  thiamine (VITAMIN B-1) 100 MG tablet Take 1 tablet (100 mg total) by mouth daily. Patient not taking: Reported on 01/02/2022 09/19/20   Janith Lima, MD  levocetirizine [facility]) 5 MG tablet TAKE 1 TABLET(5 MG) BY MOUTH EVERY EVENING 02/21/20 06/10/20  Janith Lima, MD     Critical care time: 40 minutes    Redmond School., MSN, APRN, AGACNP-BC [facility]  01/03/2022 , 8:07 PM  Please see [email or web address] for pager details  If no response, please call [phone] After hours, please call Elink at [phone]

## 2022-01-03 NOTE — Progress Notes (Signed)
Progress Note  Patient Name: [name] Date of Encounter: 01/03/2022  [facility] Cardiologist: Mertie Moores, MD   Subjective   BP remains very labile, down to 125/51 overnight but up to 219/82 this morning.  Stable creatinine at 2.7.  Reports mild chest pain this morning.  Inpatient Medications    Scheduled Meds:  [START ON 01/04/2022] amLODipine  10 mg Oral Daily   aspirin EC  81 mg Oral Daily   carvedilol  12.5 mg Oral BID WC   DULoxetine  20 mg Oral Daily   hydrALAZINE  75 mg Oral Q8H   levothyroxine  50 mcg Oral QAC breakfast   mirtazapine  15 mg Oral QHS   rosuvastatin  20 mg Oral Daily   Continuous Infusions:  heparin 800 Units/hr (01/03/22 0952)   PRN Meds: acetaminophen **OR** acetaminophen, hydrALAZINE, ondansetron **OR** ondansetron (ZOFRAN) IV   Vital Signs    Vitals:   01/03/22 0425 01/03/22 0545 01/03/22 0700 01/03/22 0739  BP:  (!) 143/58 (!) 193/76 (!) 219/82  Pulse:   83 97  Resp:    20  Temp:    98 F (36.7 C)  TempSrc:    Oral  SpO2:    90%  Weight: 56.1 kg     Height:        Intake/Output Summary (Last 24 hours) at 01/03/2022 1048 Last data filed at 01/03/2022 0300 Gross per 24 hour  Intake 1077.24 ml  Output 450 ml  Net 627.24 ml    Last 3 Weights 01/03/2022 01/01/2022 08/11/2021  Weight (lbs) 123 lb 10.9 oz 120 lb (No Data)  Weight (kg) 56.1 kg 54.432 kg (No Data)      Telemetry    SR  - Personally Reviewed  ECG     No new EKG - Personally Reviewed  Physical Exam   GEN: No acute distress.   Neck: No JVD  Trach Cardiac: RRR, no murmurs, Respiratory: Clear to auscultation bilaterally. GI: Soft, nontender, non-distended  MS: No edema; No deformity.    Labs    High Sensitivity Troponin:   Recent Labs  Lab 01/02/22 0034 01/02/22 0235  TROPONINIHS 1,457* 1,357*      Chemistry Recent Labs  Lab 01/02/22 0034 01/02/22 0811 01/03/22 0740  NA 140 139 138  K 5.0 4.4 4.0  CL 106 109 106  CO2 23 19* 21*   GLUCOSE 116* 110* 142*  BUN 66* 61* 54*  CREATININE 2.70* 2.67* 2.68*  CALCIUM 9.1 8.9 9.5  MG  --  1.7  --   PROT 5.9* 5.3*  --   ALBUMIN 3.1* 2.8*  --   AST 34 19  --   ALT 14 13  --   ALKPHOS 104 88  --   BILITOT 0.9 0.2*  --   GFRNONAA 18* 18* 18*  ANIONGAP 11 11 11      Lipids No results for input(s): CHOL, TRIG, HDL, LABVLDL, LDLCALC, CHOLHDL in the last 168 hours.  Hematology Recent Labs  Lab 01/02/22 0034 01/02/22 0811 01/03/22 0036  WBC 10.6* 9.3 9.5  RBC 3.67* 3.36* 3.58*  HGB 10.5* 9.7* 9.9*  HCT 32.1* 29.9* 30.6*  MCV 87.5 89.0 85.5  MCH 28.6 28.9 27.7  MCHC 32.7 32.4 32.4  RDW 13.8 13.7 13.7  PLT 221 180 170    Thyroid  Recent Labs  Lab 01/02/22 1528  TSH 3.283     BNP Recent Labs  Lab 01/02/22 0034  BNP 49.3     DDimer No  results for input(s): DDIMER in the last 168 hours.   Radiology    CT HEAD WO CONTRAST (5MM)  Result Date: 01/02/2022 CLINICAL DATA:  Altered mental status EXAM: CT HEAD WITHOUT CONTRAST TECHNIQUE: Contiguous axial images were obtained from the base of the skull through the vertex without intravenous contrast. RADIATION DOSE REDUCTION: This exam was performed according to the departmental dose-optimization program which includes automated exposure control, adjustment of the mA and/or kV according to patient size and/or use of iterative reconstruction technique. COMPARISON:  08/14/2021 FINDINGS: Brain: No evidence of acute infarction, hemorrhage, hydrocephalus, extra-axial collection or mass lesion/mass effect. Mild atrophic changes are noted. Vascular: No hyperdense vessel or unexpected calcification. Skull: Normal. Negative for fracture or focal lesion. Sinuses/Orbits: No acute finding. Other: None. IMPRESSION: Mild atrophic changes without acute abnormality. Electronically Signed   By: Inez Catalina M.D.   On: 01/02/2022 01:29   DG Chest Port 1 View  Result Date: 01/02/2022 CLINICAL DATA:  Headache and periorbital edema. EXAM:  PORTABLE CHEST 1 VIEW COMPARISON:  December 30, 2021 FINDINGS: The heart size and mediastinal contours are within normal limits. There is mild to moderate severity calcification of the aortic arch. Both lungs are clear. The visualized skeletal structures are unremarkable. IMPRESSION: No active disease. Electronically Signed   By: Virgina Norfolk M.D.   On: 01/02/2022 00:46   ECHOCARDIOGRAM COMPLETE  Result Date: 01/02/2022    ECHOCARDIOGRAM REPORT   Patient Name:   [name] Date of Exam: 01/02/2022 Medical Rec #:  [id number]        Height:       64.0 in Accession #:    [id number]       Weight:       120.0 lb Date of Birth:  1946/11/06       BSA:          1.575 m Patient Age:    6 years         BP:           197/77 mmHg Patient Gender: F                HR:           78 bpm. Exam Location:  Inpatient Procedure: 2D Echo, 3D Echo, Cardiac Doppler, Color Doppler and Strain Analysis Indications:    NSTEMI I21.4  History:        Patient has prior history of Echocardiogram examinations, most                 recent 01/15/2021. COPD; Risk Factors:Hypertension and Former                 Smoker. Chronic kidney disease. Past history of cancer. History                 of laryngectomy.  Sonographer:    Darlina Sicilian RDCS Referring Phys: [phone] ERIC CHEN  Sonographer Comments: Global longitudinal strain was attempted. IMPRESSIONS  1. Left ventricular ejection fraction, by estimation, is 60 to 65%. The left ventricle has normal function. The left ventricle has no regional wall motion abnormalities. There is mild concentric left ventricular hypertrophy. Left ventricular diastolic parameters are consistent with Grade I diastolic dysfunction (impaired relaxation).  2. Right ventricular systolic function is normal. The right ventricular size is normal. Tricuspid regurgitation signal is inadequate for assessing PA pressure.  3. Left atrial size was moderately dilated.  4. The mitral valve is grossly normal. No evidence of  mitral valve  regurgitation.  5. The aortic valve is grossly normal. Aortic valve regurgitation is not visualized. Comparison(s): No significant change from prior study. FINDINGS  Left Ventricle: Left ventricular ejection fraction, by estimation, is 60 to 65%. The left ventricle has normal function. The left ventricle has no regional wall motion abnormalities. The left ventricular internal cavity size was normal in size. There is  mild concentric left ventricular hypertrophy. Left ventricular diastolic parameters are consistent with Grade I diastolic dysfunction (impaired relaxation). Right Ventricle: The right ventricular size is normal. No increase in right ventricular wall thickness. Right ventricular systolic function is normal. Tricuspid regurgitation signal is inadequate for assessing PA pressure. Left Atrium: Left atrial size was moderately dilated. Right Atrium: Right atrial size was normal in size. Pericardium: There is no evidence of pericardial effusion. Mitral Valve: The mitral valve is grossly normal. There is moderate thickening of the mitral valve leaflet(s). There is mild calcification of the mitral valve leaflet(s). No evidence of mitral valve regurgitation. Tricuspid Valve: The tricuspid valve is not well visualized. Tricuspid valve regurgitation is not demonstrated. Aortic Valve: The aortic valve is grossly normal. Aortic valve regurgitation is not visualized. Pulmonic Valve: The pulmonic valve was not well visualized. Pulmonic valve regurgitation is not visualized. Aorta: The aortic root and ascending aorta are structurally normal, with no evidence of dilitation. IAS/Shunts: The interatrial septum was not well visualized.  LEFT VENTRICLE PLAX 2D LVIDd:         4.15 cm   Diastology LVIDs:         1.90 cm   LV e' medial:    3.21 cm/s LV PW:         1.05 cm   LV E/e' medial:  16.9 LV IVS:        1.00 cm   LV e' lateral:   4.46 cm/s LVOT diam:     1.80 cm   LV E/e' lateral: 12.1 LVOT Area:     2.54  cm                           3D Volume EF:                          3D EF:        55 %                          LV EDV:       129 ml                          LV ESV:       59 ml                          LV SV:        71 ml RIGHT VENTRICLE RV S prime:     11.90 cm/s TAPSE (M-mode): 1.5 cm LEFT ATRIUM           Index        RIGHT ATRIUM           Index LA diam:      2.80 cm 1.78 cm/m   RA Area:     11.30 cm LA Vol (A2C): 62.3 ml 39.57 ml/m  RA Volume:   26.00 ml  16.51 ml/m LA Vol (A4C): 52.1 ml  33.09 ml/m   AORTA Ao Root diam: 2.90 cm Ao Asc diam:  3.20 cm MITRAL VALVE MV Area (PHT): 2.86 cm     SHUNTS MV Decel Time: 265 msec     Systemic Diam: 1.80 cm MV E velocity: 54.10 cm/s MV A velocity: 104.00 cm/s MV E/A ratio:  0.52 Phineas Inches Electronically signed by Phineas Inches Signature Date/Time: 01/02/2022/12:42:05 PM    Final     Cardiac Studies   Echo  01/02/22   1. Left ventricular ejection fraction, by estimation, is 60 to 65%. The  left ventricle has normal function. The left ventricle has no regional  wall motion abnormalities. There is mild concentric left ventricular  hypertrophy. Left ventricular diastolic  parameters are consistent with Grade I diastolic dysfunction (impaired  relaxation).   2. Right ventricular systolic function is normal. The right ventricular  size is normal. Tricuspid regurgitation signal is inadequate for assessing  PA pressure.   3. Left atrial size was moderately dilated.   4. The mitral valve is grossly normal. No evidence of mitral valve  regurgitation.   5. The aortic valve is grossly normal. Aortic valve regurgitation is not  visualized.   Comparison(s): No significant change from prior study.   Patient Profile     [name] is a 76 y.o. female with a hx of poorly controlled HTN, HLD, PAF (not on [place]?), PAD, CKD4 (bl sCr ~2.5), Laryngeal SCC s/p Laryngectomy and chemo/XRT, hypothyroidism, prior DVT, COPD, prior tobacco abuse, GERD and MDD who is  being seen 01/02/2022 for the evaluation of hypertensive emergency at the request of Dr. Betsey Holiday.    Assessment & Plan    Hypertensive emergency:  Hx of poorly controlled HTN .  Presented with markedly elevated BP and troponin elevation 1457. -Continue carvedilol 12.5 bid -Hydralazine 75 mg 3 times daily added yesterday along with amlodipine 10 mg daily.  Holding olmesartan/HCTZ given her renal function.  BP remains very labile, as low as 125/51 overnight, as high as 262/96  Elevated troponin: Up to 1457.  Suspect demand ischemia in setting of markedly elevated hypertension.  Echocardiogram showed normal systolic function. -Can discontinue heparin drip.  She was previously on Eliquis for history of atrial fibrillation and DVT, was discontinued for unclear reasons.  Spoke with patient and her son, denies any history of falls or bleeding issues.  Recommend restarting Eliquis  PAF: CHA2DS2-VASc score 5 (hypertension, age x2, vascular disease, female) -Recommend transitioning from heparin drip to Eliquis  CKD  Stage IV: Baseline Cr ~2.5, was 2.7 today  Hx PAD  Laryngeal Ca, s/p laryngectomy and chemo/XRT in 2013    Hx DVT  Hx COPD      Signed, Donato Heinz, MD  01/03/2022, 10:48 AM

## 2022-01-03 NOTE — Progress Notes (Signed)
STAT EKG performed upon return from head CT. Patients EKG reads: Sinus Tachycardia Marked ST abnormality, Possible inferior subenodocardial injury. Abnormal EKG  Strip placed in chart.

## 2022-01-04 DIAGNOSIS — I129 Hypertensive chronic kidney disease with stage 1 through stage 4 chronic kidney disease, or unspecified chronic kidney disease: Secondary | ICD-10-CM | POA: Diagnosis not present

## 2022-01-04 DIAGNOSIS — I248 Other forms of acute ischemic heart disease: Secondary | ICD-10-CM | POA: Diagnosis not present

## 2022-01-04 DIAGNOSIS — N184 Chronic kidney disease, stage 4 (severe): Secondary | ICD-10-CM | POA: Diagnosis not present

## 2022-01-04 LAB — CBC
HCT: 31.9 % — ABNORMAL LOW (ref 36.0–46.0)
Hemoglobin: 10.7 g/dL — ABNORMAL LOW (ref 12.0–15.0)
MCH: 28.3 pg (ref 26.0–34.0)
MCHC: 33.5 g/dL (ref 30.0–36.0)
MCV: 84.4 fL (ref 80.0–100.0)
Platelets: 250 10*3/uL (ref 150–400)
RBC: 3.78 MIL/uL — ABNORMAL LOW (ref 3.87–5.11)
RDW: 13.9 % (ref 11.5–15.5)
WBC: 13.8 10*3/uL — ABNORMAL HIGH (ref 4.0–10.5)
nRBC: 0 % (ref 0.0–0.2)

## 2022-01-04 LAB — BASIC METABOLIC PANEL
Anion gap: 14 (ref 5–15)
BUN: 47 mg/dL — ABNORMAL HIGH (ref 8–23)
CO2: 19 mmol/L — ABNORMAL LOW (ref 22–32)
Calcium: 9.4 mg/dL (ref 8.9–10.3)
Chloride: 105 mmol/L (ref 98–111)
Creatinine, Ser: 2.62 mg/dL — ABNORMAL HIGH (ref 0.44–1.00)
GFR, Estimated: 18 mL/min — ABNORMAL LOW (ref >60)
Glucose, Bld: 142 mg/dL — ABNORMAL HIGH (ref 70–99)
Potassium: 3.9 mmol/L (ref 3.5–5.1)
Sodium: 138 mmol/L (ref 135–145)

## 2022-01-04 MED ORDER — LABETALOL HCL 5 MG/ML IV SOLN
10.0000 mg | INTRAVENOUS | Status: DC | PRN
  Administered 2022-01-04 – 2022-01-05 (×2): 10 mg via INTRAVENOUS
  Filled 2022-01-04 (×2): qty 4

## 2022-01-04 NOTE — Progress Notes (Signed)
NAME:  [name], MRN:  [id number], DOB:  Sep 09, 1946, LOS: 1 ADMISSION DATE:  01/01/2022, CONSULTATION DATE:  01/03/2022 REFERRING MD:  Dr. Sloan Leiter, CHIEF COMPLAINT:  Resistant hypertension   History of Present Illness:  [name] is a 76 y.o. female with an extensive PMH including but not limited to head and neck cancer s/p total laryngectomy and CKD stage IV (see foll HX listed below) that presented to the ED 1/20 for headache and periorbital edema. Per family blood pressure was extremity elevated at home prior to ED arrival.    Patient was admitted per [facility] for treatment of hypertension management   PCCM  called evening of 1/21 after patient began to vomit with rebound hypertension. Given labile blood pressure with IV pushes of antihypertensives PCCM was called to assist in transfer to ICU and initiation of continuous drip for BP control   Pertinent  Medical History  Head and neck cancer s/p laryngectomy  CKD stage IV HTN  HLD  PAD Stroke 2011 COPD  Depression   Significant Hospital Events: Including procedures, antibiotic start and stop dates in addition to other pertinent events   1/21 PCCM consulted for transfer to ICU to initiate IV Cleviprix   Interim History / Subjective:   Off Cleviprex drip overnight Head CT with no acute abnormality  Objective   Blood pressure (!) 207/89, pulse 88, temperature 98.9 F (37.2 C), temperature source Oral, resp. rate 16, height 5\' 4"  (1.626 m), weight 55.5 kg, SpO2 99 %.        Intake/Output Summary (Last 24 hours) at 01/04/2022 0920 Last data filed at 01/04/2022 0700 Gross per 24 hour  Intake 154.06 ml  Output 350 ml  Net -195.94 ml   Filed Weights   01/03/22 0425 01/03/22 2000 01/04/22 0500  Weight: 56.1 kg 55.6 kg 55.5 kg    Examination: Gen:      No acute distress HEENT:  EOMI, sclera anicteric Neck:     No masses; no thyromegaly, trach ostomy site is clean Lungs:    Clear to auscultation bilaterally; normal  respiratory effort CV:         Regular rate and rhythm; no murmurs Abd:      + bowel sounds; soft, non-tender; no palpable masses, no distension Ext:    No edema; adequate peripheral perfusion Skin:      Warm and dry; no rash Neuro: alert and oriented x 3 Psych: normal mood and affect   Lab/imaging reviewed Significant for BUN/creatinine 47/2.62 WBC 13.8, hemoglobin 10.7, platelets 250 CT head with no acute intracranial abnormality  Resolved Hospital Problem list     Assessment & Plan:  Hypertensive emergency, secondary to maliginant hypertension Elevated troponin, suspect secondary to demand ischmia Headache, suspect secondary to hypertension Emesis, suspect secondary to hypertension Plan BP is improved.  She is off Cleviprex drip Continue Coreg, hydralazine.  Amlodipine restarted Tylenol for headache. Hold on sedating agents  HX PAF HX DVT Resume Eliquis  CKD stage 4, secondary to HTN Follow urine output unclear  COPD with chronic bronchitis HX head and neck cancer s/p laryngectomy On room air Outpatient ENT/ONC follow up   Hypothyroidism -Continue synthroid   Stable to transfer out of ICU and back to hospitalist service  Best Practice (right click and "Reselect all SmartList Selections" daily)   Diet/type: NPO w/ oral meds DVT prophylaxis: DOAC GI prophylaxis: PPI Lines: N/A Foley:  N/A Code Status:  full code Last date of multidisciplinary goals of care discussion [Pending]  Critical care time: NA   Marshell Garfinkel MD [facility] See Amion for pager  If no response to pager , please call [phone] until 7pm After 7:00 pm call Elink  [phone] 01/04/2022, 10:06 AM

## 2022-01-04 NOTE — Evaluation (Signed)
Physical Therapy Evaluation Patient Details Name: [name] MRN: [id number] DOB: 08-30-1946 Today's Date: 01/04/2022  History of Present Illness  76 year old Hispanic female who presents to the ER 01/02/22 with a overnight history of headache, periorbital edema, and SBP>250. Elevated troponin; IV heparin started; 1/21 move to ICU for IV Cleviprix; PMH head neck cancer status post laryngectomy, history of CKD stage IV, hypertension, chronic  back pain, COPD,  Clinical Impression   Pt admitted secondary to problem above with deficits below. PTA patient was independent with all mobility and at least basic ADLs. Pt currently requires min assist for ambulation due to weakness and decr endurance. Family members she lives with work and she will be home alone at times.  Anticipate patient will benefit from PT to address problems listed below.Will continue to follow acutely to maximize functional mobility independence and safety.          Recommendations for follow up therapy are one component of a multi-disciplinary discharge planning process, led by the attending physician.  Recommendations may be updated based on patient status, additional functional criteria and insurance authorization.  Follow Up Recommendations Home health PT    Assistance Recommended at Discharge Intermittent Supervision/Assistance  Patient can return home with the following  A little help with walking and/or transfers;A little help with bathing/dressing/bathroom;Assistance with cooking/housework;Help with stairs or ramp for entrance    Equipment Recommendations Rolling walker (2 wheels)  Recommendations for Other Services       Functional Status Assessment Patient has had a recent decline in their functional status and demonstrates the ability to make significant improvements in function in a reasonable and predictable amount of time.     Precautions / Restrictions Precautions Precautions: Fall      Mobility   Bed Mobility Overal bed mobility: Needs Assistance Bed Mobility: Supine to Sit     Supine to sit: Supervision     General bed mobility comments: for safety coming off ICU/taller bed    Transfers Overall transfer level: Needs assistance Equipment used: 1 person hand held assist Transfers: Sit to/from Stand Sit to Stand: Min guard           General transfer comment: slightly shaky/weak    Ambulation/Gait Ambulation/Gait assistance: Min assist, +2 physical assistance, +2 safety/equipment Gait Distance (Feet): 75 Feet (standing rest break; 40 standing rest; 35) Assistive device: 2 person hand held assist Gait Pattern/deviations: Step-through pattern, Decreased stride length Gait velocity: decr with RR 28-34     General Gait Details: shaky/tremulous during gait; one instance of imbalance with turning inside bathroom  Stairs            Wheelchair Mobility    Modified Rankin (Stroke Patients Only)       Balance Overall balance assessment: Needs assistance Sitting-balance support: No upper extremity supported, Feet supported Sitting balance-Leahy Scale: Fair     Standing balance support: Single extremity supported, During functional activity Standing balance-Leahy Scale: Poor                               Pertinent Vitals/Pain Pain Assessment Pain Assessment: No/denies pain    Home Living Family/patient expects to be discharged to:: Private residence Living Arrangements: Other relatives Available Help at Discharge: Available PRN/intermittently Type of Home: House Home Access: Level entry       Home Layout: One level Home Equipment: Shower seat      Prior Function Prior Level of Function : Independent/Modified  Independent             Mobility Comments: independent with walking ADLs Comments: independent bathing and dressing; does not drive     Hand Dominance   Dominant Hand: Right    Extremity/Trunk Assessment   Upper  Extremity Assessment Upper Extremity Assessment: Defer to OT evaluation    Lower Extremity Assessment Lower Extremity Assessment: Generalized weakness    Cervical / Trunk Assessment Cervical / Trunk Assessment: Normal  Communication   Communication: Prefers language other than English (Spanish; but also non-verbal due to laryngectomy; translated with Google translate (typing))  Cognition Arousal/Alertness: Awake/alert Behavior During Therapy: WFL for tasks assessed/performed Overall Cognitive Status: Within Functional Limits for tasks assessed                                 General Comments: cognition not specifically assessed; answered questions and followed instructions (including gestures)        General Comments General comments (skin integrity, edema, etc.): HR 92-102 during ambuation (75 at rest); unable to get O2 saturation to read; RRmax 34    Exercises     Assessment/Plan    PT Assessment Patient needs continued PT services  PT Problem List Decreased strength;Decreased activity tolerance;Decreased balance;Decreased mobility;Decreased knowledge of use of DME;Cardiopulmonary status limiting activity       PT Treatment Interventions DME instruction;Gait training;Functional mobility training;Therapeutic activities;Therapeutic exercise;Balance training;Patient/family education    PT Goals (Current goals can be found in the Care Plan section)  Acute Rehab PT Goals Patient Stated Goal: unable to state PT Goal Formulation: With patient Time For Goal Achievement: 01/18/22 Potential to Achieve Goals: Good    Frequency Min 3X/week     Co-evaluation               AM-PAC PT "6 Clicks" Mobility  Outcome Measure Help needed turning from your back to your side while in a flat bed without using bedrails?: None Help needed moving from lying on your back to sitting on the side of a flat bed without using bedrails?: A Little Help needed moving to and  from a bed to a chair (including a wheelchair)?: A Little Help needed standing up from a chair using your arms (e.g., wheelchair or bedside chair)?: A Little Help needed to walk in hospital room?: Total Help needed climbing 3-5 steps with a railing? : Total 6 Click Score: 15    End of Session   Activity Tolerance: Patient limited by fatigue Patient left: in bed;with call bell/phone within reach;with bed alarm set Nurse Communication: Mobility status PT Visit Diagnosis: Unsteadiness on feet (R26.81);Muscle weakness (generalized) (M62.81)    Time: 5638-9373 PT Time Calculation (min) (ACUTE ONLY): 31 min   Charges:   PT Evaluation $PT Eval Moderate Complexity: [place], PT Acute Rehabilitation Services  Pager [phone] Office [phone]   Rexanne Mano 01/04/2022, 2:21 PM

## 2022-01-04 NOTE — Evaluation (Signed)
Occupational Therapy Evaluation Patient Details Name: [name] MRN: [id number] DOB: [birth year] Today's Date: 01/04/2022   History of Present Illness 76 year old Hispanic female who presents to the ER 01/02/22 with a overnight history of headache, periorbital edema, and SBP>250. Elevated troponin; IV heparin started; 1/21 move to ICU for IV Cleviprix; PMH head neck cancer status post laryngectomy, history of CKD stage IV, hypertension, chronic  back pain, COPD,   Clinical Impression   Pt admitted with above. She demonstrates the below listed deficits and will benefit from continued OT to maximize safety and independence with BADLs.  Pt required min A for LB ADLs and functional mobility due to decreased balance, decreased activity tolerance, and generalized weakness.  Per chart review, and pt report, she lives with her children and was fully independent PTA.  Anticipate she will progress well and be able to return home.        Recommendations for follow up therapy are one component of a multi-disciplinary discharge planning process, led by the attending physician.  Recommendations may be updated based on patient status, additional functional criteria and insurance authorization.   Follow Up Recommendations  No OT follow up    Assistance Recommended at Discharge Intermittent Supervision/Assistance  Patient can return home with the following A little help with walking and/or transfers;A little help with bathing/dressing/bathroom;Assist for transportation    Functional Status Assessment  Patient has had a recent decline in their functional status and demonstrates the ability to make significant improvements in function in a reasonable and predictable amount of time.  Equipment Recommendations  Tub/shower seat    Recommendations for Other Services       Precautions / Restrictions Precautions Precautions: Fall      Mobility Bed Mobility Overal bed mobility: Needs Assistance Bed  Mobility: Supine to Sit     Supine to sit: Supervision     General bed mobility comments: for safety coming off ICU/taller bed    Transfers Overall transfer level: Needs assistance Equipment used: 1 person hand held assist Transfers: Sit to/from Stand, Bed to chair/wheelchair/BSC Sit to Stand: Min guard     Step pivot transfers: Min assist     General transfer comment: slightly shaky/weak      Balance Overall balance assessment: Needs assistance Sitting-balance support: No upper extremity supported, Feet supported Sitting balance-Leahy Scale: Fair     Standing balance support: Single extremity supported, During functional activity Standing balance-Leahy Scale: Poor                             ADL either performed or assessed with clinical judgement   ADL Overall ADL's : Needs assistance/impaired Eating/Feeding: Independent   Grooming: Wash/dry face;Wash/dry hands;Oral care;Brushing hair;Min guard;Standing   Upper Body Bathing: Set up;Supervision/ safety;Sitting   Lower Body Bathing: Minimal assistance;Sit to/from stand   Upper Body Dressing : Set up;Sitting   Lower Body Dressing: Minimal assistance;Sit to/from stand   Toilet Transfer: Minimal assistance;Ambulation;Comfort height toilet   Toileting- Clothing Manipulation and Hygiene: Min guard;Sit to/from stand       Functional mobility during ADLs: Minimal assistance       Vision         Perception     Praxis      Pertinent Vitals/Pain Pain Assessment Pain Assessment: No/denies pain     Hand Dominance Right   Extremity/Trunk Assessment Upper Extremity Assessment Upper Extremity Assessment: Generalized weakness   Lower Extremity Assessment Lower Extremity Assessment: Defer  to PT evaluation   Cervical / Trunk Assessment Cervical / Trunk Assessment: Normal   Communication Communication Communication: Prefers language other than English (Spanish; but also non-verbal due to  laryngectomy; translated with Google translate (typing))   Cognition Arousal/Alertness: Awake/alert Behavior During Therapy: WFL for tasks assessed/performed Overall Cognitive Status: Within Functional Limits for tasks assessed                                 General Comments: cognition not specifically assessed; answered questions and followed instructions (including gestures)     General Comments  HR 92-102 during ambuation (75 at rest); unable to get O2 saturation to read; RRmax 34, BP 132/83 at rest, and 169/103 after ambulation    Exercises     Shoulder Instructions      Home Living Family/patient expects to be discharged to:: Private residence Living Arrangements: Other relatives Available Help at Discharge: Available PRN/intermittently Type of Home: House Home Access: Level entry     Home Layout: One level     Bathroom Shower/Tub: Teacher, early years/pre: Standard Bathroom Accessibility: Yes   Home Equipment: Shower seat   Additional Comments: information gleaned from chart review      Prior Functioning/Environment Prior Level of Function : Independent/Modified Independent             Mobility Comments: independent with walking ADLs Comments: independent bathing and dressing; does not drive        OT Problem List: Decreased strength;Decreased activity tolerance;Impaired balance (sitting and/or standing);Decreased knowledge of use of DME or AE;Cardiopulmonary status limiting activity      OT Treatment/Interventions: Self-care/ADL training;Therapeutic exercise;Energy conservation;DME and/or AE instruction;Therapeutic activities;Balance training    OT Goals(Current goals can be found in the care plan section) Acute Rehab OT Goals Patient Stated Goal: did not state OT Goal Formulation: With patient Time For Goal Achievement: 01/18/22 Potential to Achieve Goals: Good ADL Goals Pt Will Perform Grooming: with modified  independence;standing Pt Will Perform Upper Body Bathing: with modified independence;sitting Pt Will Perform Lower Body Bathing: with modified independence;sit to/from stand Pt Will Perform Upper Body Dressing: with modified independence;sitting Pt Will Perform Lower Body Dressing: with modified independence;sit to/from stand Pt Will Transfer to Toilet: with modified independence;ambulating;regular height toilet Pt Will Perform Toileting - Clothing Manipulation and hygiene: with modified independence;sit to/from stand  OT Frequency: Min 2X/week    Co-evaluation PT/OT/SLP Co-Evaluation/Treatment: Yes Reason for Co-Treatment: For patient/therapist safety;To address functional/ADL transfers   OT goals addressed during session: ADL's and self-care      AM-PAC OT "6 Clicks" Daily Activity     Outcome Measure Help from another person eating meals?: None Help from another person taking care of personal grooming?: A Little Help from another person toileting, which includes using toliet, bedpan, or urinal?: A Little Help from another person bathing (including washing, rinsing, drying)?: A Little Help from another person to put on and taking off regular upper body clothing?: A Little Help from another person to put on and taking off regular lower body clothing?: A Little 6 Click Score: 19   End of Session Nurse Communication: Mobility status  Activity Tolerance:   Patient left: in bed;with call bell/phone within reach;with bed alarm set  OT Visit Diagnosis: Unsteadiness on feet (R26.81)                Time: 1335-1406 OT Time Calculation (min): 31 min Charges:  OT General  Charges $OT Visit: 1 Visit OT Evaluation $OT Eval Moderate Complexity: 1 Mod  Nilsa Nutting., OTR/L Acute Rehabilitation Services Pager [phone] Office [place], [place] 01/04/2022, 2:55 PM

## 2022-01-04 NOTE — Progress Notes (Signed)
Contacted [facility] at this time due to patients SBP in 130's with cleveprex at 1mg . Unable to titrate any lower so paused at this time. Per MD SBP to stay between 180-200. However patients instantly dropped from 175 to 130's on 1mg . Will keep medication paused for now awaiting MD order per [facility]. Will continue to monitor SBP and restart if needed.

## 2022-01-04 NOTE — Progress Notes (Signed)
[facility] Physician-Brief Progress Note Patient Name: LEXIA VANDEVENDER DOB: 10-May-1946 MRN: [id number]   Date of Service  01/04/2022  HPI/Events of Note  76 y.o. female with head and neck cancer s/p total laryngectomy and CKD stage IV presented to the ED 1/20 for headache and periorbital edema, hypertensive urgency/emergency.   PCCM  called 1/21 after patient began to vomit with rebound hypertension. Was moved to ICU. Seen earlier, son present at bedside, on Clevidipine infusion- mentating well at that time with SBP 190-200, was much improved from prior. Now BP has been labile, down to 130 SBP- however, despite this she has maintained mentation, interactive, more alert than prior.   eICU Interventions  - keep off Clevidipine - use Labetolol prn for BP  - allow SBP rise to 160-180         Lonnie Reth N Laporcha Marchesi 01/04/2022, 2:59 AM

## 2022-01-04 NOTE — Progress Notes (Signed)
Progress Note  Patient Name: [name] Date of Encounter: 01/04/2022  [facility] Cardiologist: Mertie Moores, MD   Subjective   Moved to ICU yesterday evening.  Had episode of vomiting with rebound hypertension.  BP markedly elevated, started on IV Cleviprex.  She was more somnolent, head CT was done which was unremarkable.  BP fell to SBP 130s, Cleviprex was discontinued.  Most recent BP 180/92.  Denies any chest pain or dyspnea.  Inpatient Medications    Scheduled Meds:  amLODipine  10 mg Oral Daily   apixaban  2.5 mg Oral BID   aspirin EC  81 mg Oral Daily   carvedilol  12.5 mg Oral BID WC   Chlorhexidine Gluconate Cloth  6 each Topical Daily   DULoxetine  20 mg Oral Daily   hydrALAZINE  75 mg Oral Q8H   levothyroxine  50 mcg Oral QAC breakfast   mirtazapine  15 mg Oral QHS   rosuvastatin  20 mg Oral Daily   Continuous Infusions:   PRN Meds: acetaminophen **OR** acetaminophen, labetalol, ondansetron **OR** ondansetron (ZOFRAN) IV   Vital Signs    Vitals:   01/04/22 0700 01/04/22 0736 01/04/22 0752 01/04/22 0800  BP: 131/72 (!) 147/82 (!) 186/94 (!) 180/92  Pulse: 90 94 96 94  Resp: 18 (!) 23 (!) 21 20  Temp:  98.9 F (37.2 C)    TempSrc:  Oral    SpO2: 95% 98% 95% 99%  Weight:      Height:        Intake/Output Summary (Last 24 hours) at 01/04/2022 0837 Last data filed at 01/04/2022 0700 Gross per 24 hour  Intake 154.06 ml  Output 350 ml  Net -195.94 ml    Last 3 Weights 01/04/2022 01/03/2022 01/03/2022  Weight (lbs) 122 lb 5.7 oz 122 lb 9.2 oz 123 lb 10.9 oz  Weight (kg) 55.5 kg 55.6 kg 56.1 kg      Telemetry    SR  - Personally Reviewed  ECG    Sinus rhythm, rate 117, diffuse subtle ST depressions with aVR elevation- Personally Reviewed  Physical Exam   GEN: No acute distress.   Neck: No JVD  Trach Cardiac: RRR, no murmurs, Respiratory: Clear to auscultation bilaterally. GI: Soft, nontender, non-distended  MS: No edema; No  deformity.    Labs    High Sensitivity Troponin:   Recent Labs  Lab 01/02/22 0034 01/02/22 0235  TROPONINIHS 1,457* 1,357*      Chemistry Recent Labs  Lab 01/02/22 0034 01/02/22 0811 01/03/22 0740 01/04/22 0228  NA 140 139 138 138  K 5.0 4.4 4.0 3.9  CL 106 109 106 105  CO2 23 19* 21* 19*  GLUCOSE 116* 110* 142* 142*  BUN 66* 61* 54* 47*  CREATININE 2.70* 2.67* 2.68* 2.62*  CALCIUM 9.1 8.9 9.5 9.4  MG  --  1.7  --   --   PROT 5.9* 5.3*  --   --   ALBUMIN 3.1* 2.8*  --   --   AST 34 19  --   --   ALT 14 13  --   --   ALKPHOS 104 88  --   --   BILITOT 0.9 0.2*  --   --   GFRNONAA 18* 18* 18* 18*  ANIONGAP 11 11 11 14      Lipids No results for input(s): CHOL, TRIG, HDL, LABVLDL, LDLCALC, CHOLHDL in the last 168 hours.  Hematology Recent Labs  Lab 01/02/22 [phone] 01/03/22 0036  01/04/22 0228  WBC 9.3 9.5 13.8*  RBC 3.36* 3.58* 3.78*  HGB 9.7* 9.9* 10.7*  HCT 29.9* 30.6* 31.9*  MCV 89.0 85.5 84.4  MCH 28.9 27.7 28.3  MCHC 32.4 32.4 33.5  RDW 13.7 13.7 13.9  PLT 180 170 250    Thyroid  Recent Labs  Lab 01/02/22 1528  TSH 3.283     BNP Recent Labs  Lab 01/02/22 0034  BNP 49.3     DDimer No results for input(s): DDIMER in the last 168 hours.   Radiology    CT HEAD WO CONTRAST (5MM)  Result Date: 01/03/2022 CLINICAL DATA:  Hypertensive emergency, altered mental status. EXAM: CT HEAD WITHOUT CONTRAST TECHNIQUE: Contiguous axial images were obtained from the base of the skull through the vertex without intravenous contrast. RADIATION DOSE REDUCTION: This exam was performed according to the departmental dose-optimization program which includes automated exposure control, adjustment of the mA and/or kV according to patient size and/or use of iterative reconstruction technique. COMPARISON:  01/02/2022. FINDINGS: Brain: No acute intracranial hemorrhage, midline shift or mass effect. No extra-axial fluid collection. Mild periventricular white matter  hypodensities are seen bilaterally. No hydrocephalus. Mild atrophy is noted. Vascular: No hyperdense vessel or unexpected calcification. Skull: Normal. Negative for fracture or focal lesion. Sinuses/Orbits: No acute finding. Other: None. IMPRESSION: Stable head CT with no acute intracranial process. Electronically Signed   By: Brett Fairy M.D.   On: 01/03/2022 21:22   ECHOCARDIOGRAM COMPLETE  Result Date: 01/02/2022    ECHOCARDIOGRAM REPORT   Patient Name:   [name] Date of Exam: 01/02/2022 Medical Rec #:  [id number]        Height:       64.0 in Accession #:    [id number]       Weight:       120.0 lb Date of Birth:  01-18-1946       BSA:          1.575 m Patient Age:    76 years         BP:           197/77 mmHg Patient Gender: F                HR:           78 bpm. Exam Location:  Inpatient Procedure: 2D Echo, 3D Echo, Cardiac Doppler, Color Doppler and Strain Analysis Indications:    NSTEMI I21.4  History:        Patient has prior history of Echocardiogram examinations, most                 recent 01/15/2021. COPD; Risk Factors:Hypertension and Former                 Smoker. Chronic kidney disease. Past history of cancer. History                 of laryngectomy.  Sonographer:    Darlina Sicilian RDCS Referring Phys: [id number] ERIC CHEN  Sonographer Comments: Global longitudinal strain was attempted. IMPRESSIONS  1. Left ventricular ejection fraction, by estimation, is 60 to 65%. The left ventricle has normal function. The left ventricle has no regional wall motion abnormalities. There is mild concentric left ventricular hypertrophy. Left ventricular diastolic parameters are consistent with Grade I diastolic dysfunction (impaired relaxation).  2. Right ventricular systolic function is normal. The right ventricular size is normal. Tricuspid regurgitation signal is inadequate for assessing PA pressure.  3. Left atrial size was  moderately dilated.  4. The mitral valve is grossly normal. No evidence of mitral  valve regurgitation.  5. The aortic valve is grossly normal. Aortic valve regurgitation is not visualized. Comparison(s): No significant change from prior study. FINDINGS  Left Ventricle: Left ventricular ejection fraction, by estimation, is 60 to 65%. The left ventricle has normal function. The left ventricle has no regional wall motion abnormalities. The left ventricular internal cavity size was normal in size. There is  mild concentric left ventricular hypertrophy. Left ventricular diastolic parameters are consistent with Grade I diastolic dysfunction (impaired relaxation). Right Ventricle: The right ventricular size is normal. No increase in right ventricular wall thickness. Right ventricular systolic function is normal. Tricuspid regurgitation signal is inadequate for assessing PA pressure. Left Atrium: Left atrial size was moderately dilated. Right Atrium: Right atrial size was normal in size. Pericardium: There is no evidence of pericardial effusion. Mitral Valve: The mitral valve is grossly normal. There is moderate thickening of the mitral valve leaflet(s). There is mild calcification of the mitral valve leaflet(s). No evidence of mitral valve regurgitation. Tricuspid Valve: The tricuspid valve is not well visualized. Tricuspid valve regurgitation is not demonstrated. Aortic Valve: The aortic valve is grossly normal. Aortic valve regurgitation is not visualized. Pulmonic Valve: The pulmonic valve was not well visualized. Pulmonic valve regurgitation is not visualized. Aorta: The aortic root and ascending aorta are structurally normal, with no evidence of dilitation. IAS/Shunts: The interatrial septum was not well visualized.  LEFT VENTRICLE PLAX 2D LVIDd:         4.15 cm   Diastology LVIDs:         1.90 cm   LV e' medial:    3.21 cm/s LV PW:         1.05 cm   LV E/e' medial:  16.9 LV IVS:        1.00 cm   LV e' lateral:   4.46 cm/s LVOT diam:     1.80 cm   LV E/e' lateral: 12.1 LVOT Area:     2.54 cm                            3D Volume EF:                          3D EF:        55 %                          LV EDV:       129 ml                          LV ESV:       59 ml                          LV SV:        71 ml RIGHT VENTRICLE RV S prime:     11.90 cm/s TAPSE (M-mode): 1.5 cm LEFT ATRIUM           Index        RIGHT ATRIUM           Index LA diam:      2.80 cm 1.78 cm/m   RA Area:     11.30 cm LA Vol (A2C): 62.3 ml 39.57  ml/m  RA Volume:   26.00 ml  16.51 ml/m LA Vol (A4C): 52.1 ml 33.09 ml/m   AORTA Ao Root diam: 2.90 cm Ao Asc diam:  3.20 cm MITRAL VALVE MV Area (PHT): 2.86 cm     SHUNTS MV Decel Time: 265 msec     Systemic Diam: 1.80 cm MV E velocity: 54.10 cm/s MV A velocity: 104.00 cm/s MV E/A ratio:  0.52 Phineas Inches Electronically signed by Phineas Inches Signature Date/Time: 01/02/2022/12:42:05 PM    Final     Cardiac Studies   Echo  01/02/22   1. Left ventricular ejection fraction, by estimation, is 60 to 65%. The  left ventricle has normal function. The left ventricle has no regional  wall motion abnormalities. There is mild concentric left ventricular  hypertrophy. Left ventricular diastolic  parameters are consistent with Grade I diastolic dysfunction (impaired  relaxation).   2. Right ventricular systolic function is normal. The right ventricular  size is normal. Tricuspid regurgitation signal is inadequate for assessing  PA pressure.   3. Left atrial size was moderately dilated.   4. The mitral valve is grossly normal. No evidence of mitral valve  regurgitation.   5. The aortic valve is grossly normal. Aortic valve regurgitation is not  visualized.   Comparison(s): No significant change from prior study.   Patient Profile     [name] is a 76 y.o. female with a hx of poorly controlled HTN, HLD, PAF (not on [place]?), PAD, CKD4 (bl sCr ~2.5), Laryngeal SCC s/p Laryngectomy and chemo/XRT, hypothyroidism, prior DVT, COPD, prior tobacco abuse, GERD and MDD who is being seen  01/02/2022 for the evaluation of hypertensive emergency at the request of Dr. Betsey Holiday.    Assessment & Plan    Hypertensive emergency:  Hx of poorly controlled HTN .  Presented with markedly elevated BP and troponin elevation 1457. -Continue carvedilol 12.5 bid, amlodipine 10 mg daily, hydralazine 75 mg 3 times daily.  Required Cleviprex drip overnight after marked hypertension after vomiting.  SBP fell to 130s, gtt discontinued.    Elevated troponin: Up to 1457.  Suspect demand ischemia in setting of markedly elevated hypertension.  Echocardiogram showed normal systolic function. -Discontinued heparin drip.  She was previously on Eliquis for history of atrial fibrillation and DVT, was discontinued for unclear reasons.  Spoke with patient and her son, denies any history of falls or bleeding issues.  Head CT unremarkable. Restarted Eliquis  PAF: CHA2DS2-VASc score 5 (hypertension, age x2, vascular disease, female) -Continue Eliquis 2.5 mg BID, reduced dose given weight and renal function  CKD  Stage IV: Baseline Cr ~2.5, was 2.6 today  Hx PAD  Laryngeal Ca, s/p laryngectomy and chemo/XRT in 2013    Hx DVT  Hx COPD      Signed, Donato Heinz, MD  01/04/2022, 8:37 AM

## 2022-01-05 DIAGNOSIS — I129 Hypertensive chronic kidney disease with stage 1 through stage 4 chronic kidney disease, or unspecified chronic kidney disease: Secondary | ICD-10-CM | POA: Diagnosis not present

## 2022-01-05 DIAGNOSIS — R778 Other specified abnormalities of plasma proteins: Secondary | ICD-10-CM | POA: Diagnosis not present

## 2022-01-05 DIAGNOSIS — Z86718 Personal history of other venous thrombosis and embolism: Secondary | ICD-10-CM

## 2022-01-05 DIAGNOSIS — N189 Chronic kidney disease, unspecified: Secondary | ICD-10-CM | POA: Diagnosis present

## 2022-01-05 DIAGNOSIS — I161 Hypertensive emergency: Secondary | ICD-10-CM | POA: Diagnosis not present

## 2022-01-05 DIAGNOSIS — D631 Anemia in chronic kidney disease: Secondary | ICD-10-CM

## 2022-01-05 DIAGNOSIS — D649 Anemia, unspecified: Secondary | ICD-10-CM | POA: Diagnosis present

## 2022-01-05 DIAGNOSIS — N184 Chronic kidney disease, stage 4 (severe): Secondary | ICD-10-CM | POA: Diagnosis not present

## 2022-01-05 LAB — URINALYSIS, ROUTINE W REFLEX MICROSCOPIC
Bilirubin Urine: NEGATIVE
Glucose, UA: NEGATIVE mg/dL
Hgb urine dipstick: NEGATIVE
Ketones, ur: NEGATIVE mg/dL
Nitrite: NEGATIVE
Protein, ur: >=300 mg/dL — AB
Specific Gravity, Urine: 1.017 (ref 1.005–1.030)
pH: 9 — ABNORMAL HIGH (ref 5.0–8.0)

## 2022-01-05 LAB — CBC
HCT: 32.5 % — ABNORMAL LOW (ref 36.0–46.0)
Hemoglobin: 10.7 g/dL — ABNORMAL LOW (ref 12.0–15.0)
MCH: 28.6 pg (ref 26.0–34.0)
MCHC: 32.9 g/dL (ref 30.0–36.0)
MCV: 86.9 fL (ref 80.0–100.0)
Platelets: 205 10*3/uL (ref 150–400)
RBC: 3.74 MIL/uL — ABNORMAL LOW (ref 3.87–5.11)
RDW: 14 % (ref 11.5–15.5)
WBC: 11.2 10*3/uL — ABNORMAL HIGH (ref 4.0–10.5)
nRBC: 0 % (ref 0.0–0.2)

## 2022-01-05 LAB — BASIC METABOLIC PANEL
Anion gap: 11 (ref 5–15)
BUN: 62 mg/dL — ABNORMAL HIGH (ref 8–23)
CO2: 21 mmol/L — ABNORMAL LOW (ref 22–32)
Calcium: 9.1 mg/dL (ref 8.9–10.3)
Chloride: 107 mmol/L (ref 98–111)
Creatinine, Ser: 3.44 mg/dL — ABNORMAL HIGH (ref 0.44–1.00)
GFR, Estimated: 13 mL/min — ABNORMAL LOW (ref >60)
Glucose, Bld: 108 mg/dL — ABNORMAL HIGH (ref 70–99)
Potassium: 3.9 mmol/L (ref 3.5–5.1)
Sodium: 139 mmol/L (ref 135–145)

## 2022-01-05 LAB — PHOSPHORUS: Phosphorus: 7.3 mg/dL — ABNORMAL HIGH (ref 2.5–4.6)

## 2022-01-05 LAB — MAGNESIUM: Magnesium: 2 mg/dL (ref 1.7–2.4)

## 2022-01-05 MED ORDER — ENSURE ENLIVE PO LIQD
237.0000 mL | Freq: Every day | ORAL | Status: DC
  Administered 2022-01-05 – 2022-01-07 (×3): 237 mL via ORAL

## 2022-01-05 MED ORDER — CILOSTAZOL 100 MG PO TABS
100.0000 mg | ORAL_TABLET | Freq: Two times a day (BID) | ORAL | Status: DC
  Administered 2022-01-05 – 2022-01-08 (×7): 100 mg via ORAL
  Filled 2022-01-05 (×8): qty 1

## 2022-01-05 MED ORDER — IBUPROFEN 200 MG PO TABS
200.0000 mg | ORAL_TABLET | Freq: Three times a day (TID) | ORAL | Status: DC | PRN
  Administered 2022-01-05: 200 mg via ORAL
  Filled 2022-01-05: qty 1

## 2022-01-05 MED ORDER — CARVEDILOL 25 MG PO TABS: 25.0000 mg | ORAL_TABLET | Freq: Two times a day (BID) | ORAL | Status: DC

## 2022-01-05 MED ORDER — SODIUM CHLORIDE 0.9 % IV SOLN
1.0000 g | INTRAVENOUS | Status: DC
  Administered 2022-01-05 – 2022-01-07 (×3): 1 g via INTRAVENOUS
  Filled 2022-01-05 (×4): qty 10

## 2022-01-05 MED ORDER — OXYCODONE HCL 5 MG PO TABS
2.5000 mg | ORAL_TABLET | Freq: Three times a day (TID) | ORAL | Status: DC | PRN
  Administered 2022-01-05 – 2022-01-06 (×2): 2.5 mg via ORAL
  Filled 2022-01-05 (×3): qty 1

## 2022-01-05 MED ORDER — LORAZEPAM 0.5 MG PO TABS
0.5000 mg | ORAL_TABLET | Freq: Two times a day (BID) | ORAL | Status: DC
  Administered 2022-01-05 – 2022-01-08 (×7): 0.5 mg via ORAL
  Filled 2022-01-05 (×7): qty 1

## 2022-01-05 MED ORDER — ISOSORBIDE MONONITRATE ER 60 MG PO TB24
60.0000 mg | ORAL_TABLET | Freq: Every day | ORAL | Status: DC
  Administered 2022-01-05 – 2022-01-08 (×4): 60 mg via ORAL
  Filled 2022-01-05 (×4): qty 1

## 2022-01-05 MED ORDER — IBUPROFEN 100 MG PO CHEW: 200.0000 mg | CHEWABLE_TABLET | Freq: Three times a day (TID) | ORAL | Status: DC | PRN

## 2022-01-05 MED ORDER — HYDRALAZINE HCL 50 MG PO TABS
100.0000 mg | ORAL_TABLET | Freq: Three times a day (TID) | ORAL | Status: DC
  Administered 2022-01-05 – 2022-01-08 (×9): 100 mg via ORAL
  Filled 2022-01-05 (×9): qty 2

## 2022-01-05 MED ORDER — ISOSORBIDE MONONITRATE ER 60 MG PO TB24
60.0000 mg | ORAL_TABLET | Freq: Every day | ORAL | Status: DC
  Filled 2022-01-05: qty 1

## 2022-01-05 MED ORDER — HYDRALAZINE HCL 50 MG PO TABS: 100.0000 mg | ORAL_TABLET | Freq: Three times a day (TID) | ORAL | Status: DC

## 2022-01-05 MED ORDER — CARVEDILOL 12.5 MG PO TABS
12.5000 mg | ORAL_TABLET | Freq: Two times a day (BID) | ORAL | Status: DC
  Administered 2022-01-05 – 2022-01-06 (×2): 12.5 mg via ORAL
  Filled 2022-01-05 (×2): qty 1

## 2022-01-05 NOTE — Assessment & Plan Note (Signed)
Continue reduced dose Eliquis.

## 2022-01-05 NOTE — Progress Notes (Addendum)
PROGRESS NOTE   [name]  GYB:[id number]    DOB: [birth year]    DOA: 01/01/2022  PCP: Hoyt Koch, MD   I have briefly reviewed patients previous medical records in [facility].  Chief Complaint  Patient presents with   Hypertension    Hospital Course:  76 year old female with extensive medical history including but not limited to head and neck cancer s/p total laryngectomy, stage IV CKD, HTN, HLD, PAD, prior stroke, COPD and depression who presented to the ED 01/02/2022 for headache and periorbital edema.  Family reported that her blood pressure was extremely elevated prior to ED arrival.  Initially admitted to [facility] for HTN management.  On 01/03/2022, patient began to vomit with rebound hypertension and given labile blood pressures, patient was transferred to ICU under CCM care.  Blood pressures improved after initiation of Cleviprex drip and oral antihypertensive med management.  Care transferred to [facility] on 1/23 but blood pressures continue to remain high.   Assessment & Plan:  Principal Problem:   Hypertensive emergency Active Problems:   Elevated troponin due to demand ischemia   PAF (paroxysmal atrial fibrillation)-CHADS2 Vas score of 5   CKD stage 4 secondary to hypertension (HCC)   GAD (generalized anxiety disorder)   COPD (chronic obstructive pulmonary disease) with chronic bronchitis (HCC)   Hypothyroidism   History of head and neck cancer-s/p laryngectomy-with tracheostomy   History of DVT (deep vein thrombosis)   Anemia in CKD (chronic kidney disease)  Hypertensive emergency-kindly see discussion below.  Elevated troponin due to demand ischemia Troponin peaked to 1457 Cardiology on board. Suspect demand ischemia in the setting of hypertensive emergency.  No anginal symptoms. 2D echo shows normal LVEF. Briefly on IV heparin drip, since discontinued.  CKD stage 4 secondary to hypertension (HCC) Acute kidney injury complicating stage IV chronic  kidney disease  Baseline creatinine approximately 2.5. Creatinine up to 3.44 on 1/23. AKI may be related to hemodynamics from hypertensive emergency. Avoid nephrotoxic's, NSAIDs discontinued 1/23. Trend daily BMP.   History of head and neck cancer-s/p laryngectomy-with tracheostomy Chronic issue-stable for outpatient follow-up with ENT/oncology.  S/P laryngectomy Chronic.  COPD (chronic obstructive pulmonary disease) with chronic bronchitis (HCC) Stable-continue with bronchodilators.  PAF (paroxysmal atrial fibrillation)-CHADS2 Vas score of 5 Cardiology on board. CHA2DS2-VASc score: 5 Briefly on IV heparin, now on reduced dose Eliquis 2.5 Mg twice daily due to renal insufficiency TSH normal Remains in sinus rhythm on telemetry..  Hypothyroidism Continue levothyroxine.  GAD (generalized anxiety disorder) Depression: Resume lorazepam at half of home dose-continue duloxetine.  Hypertensive emergency Secondary to malignant hypertension Off of Cleviprex drip On carvedilol 12.5 Mg twice daily, hydralazine 75 Mg 3 times daily and amlodipine 10 Mg daily. Blood pressures up to 217/83 at bedside this morning. Hydralazine now increased to 100 mg 3 times daily and home Imdur 60 Mg daily initiated. Remains on as needed IV labetalol. As per cardiology recommendations, ordered renal artery duplex to rule out renal artery stenosis. Monitor closely and adjust antihypertensives as needed. Headache likely related to uncontrolled hypertension.  Added as needed low-dose Oxy IR, discontinued ibuprofen.  Continue as needed Tylenol.  Nausea and vomiting seem to have resolved.  History of DVT (deep vein thrombosis) Continue reduced dose Eliquis.  Anemia in CKD (chronic kidney disease) Stable    Body mass index is 20.81 kg/m.     DVT prophylaxis: apixaban (ELIQUIS) tablet 2.5 mg Start: 01/03/22 1315 SCDs Start: 01/02/22 0657     Code Status: Full Code:  Family Communication: None at  bedside Disposition:  Status is: Inpatient  Remains inpatient appropriate because: Severity of illness        Consultants:   PCCM Cardiology  Procedures:   None  Antimicrobials:   None   Subjective:  Limited history from patient.  Has tracheostomy but does not seem to be able to occlude and speak.  Able to mouth words and says she feels "okay" and denies complaints.  As per RN, no acute issues noted.  Objective:   Vitals:   01/06/22 0100 01/06/22 0200 01/06/22 0324 01/06/22 0400  BP: (!) 108/55 (!) 120/48  (!) 149/52  Pulse: 81 78  78  Resp: 19 17  14   Temp:   98.5 F (36.9 C)   TempSrc:   Oral   SpO2: 94% 92%  93%  Weight:      Height:        General exam: Elderly female, small built and frail, chronically ill looking lying comfortably propped up in bed without distress. Neck: Hole of tracheostomy at base of neck and midline without any device in it. Respiratory system: Clear to auscultation. Respiratory effort normal. Cardiovascular system: S1 & S2 heard, RRR. No JVD, murmurs, rubs, gallops or clicks. No pedal edema.  Laboratory personally reviewed: Sinus rhythm. Gastrointestinal system: Abdomen is nondistended, soft and nontender. No organomegaly or masses felt. Normal bowel sounds heard. Central nervous system: Seems alert and oriented. No focal neurological deficits. Extremities: Symmetric 5 x 5 power. Skin: No rashes, lesions or ulcers Psychiatry: Judgement and insight appear normal. Mood & affect appropriate.     Data Reviewed:   I have personally reviewed following labs and imaging studies   CBC: Recent Labs  Lab 01/02/22 0034 01/02/22 0811 01/03/22 0036 01/04/22 0228 01/05/22 0147  WBC 10.6* 9.3 9.5 13.8* 11.2*  NEUTROABS 7.4 6.1  --   --   --   HGB 10.5* 9.7* 9.9* 10.7* 10.7*  HCT 32.1* 29.9* 30.6* 31.9* 32.5*  MCV 87.5 89.0 85.5 84.4 86.9  PLT 221 180 170 250 426    Basic Metabolic Panel: Recent Labs  Lab 01/02/22 0811  01/03/22 0740 01/04/22 0228 01/05/22 0147 01/06/22 0219  NA 139 138 138 139 135  K 4.4 4.0 3.9 3.9 3.9  CL 109 106 105 107 105  CO2 19* 21* 19* 21* 18*  GLUCOSE 110* 142* 142* 108* 138*  BUN 61* 54* 47* 62* 58*  CREATININE 2.67* 2.68* 2.62* 3.44* 3.16*  CALCIUM 8.9 9.5 9.4 9.1 8.7*  MG 1.7  --   --  2.0  --   PHOS  --   --   --  7.3*  --     Liver Function Tests: Recent Labs  Lab 01/02/22 0034 01/02/22 0811  AST 34 19  ALT 14 13  ALKPHOS 104 88  BILITOT 0.9 0.2*  PROT 5.9* 5.3*  ALBUMIN 3.1* 2.8*    CBG: Recent Labs  Lab 01/03/22 2001  GLUCAP 156*    Microbiology Studies:   Recent Results (from the past 240 hour(s))  Resp Panel by RT-PCR (Flu A&B, Covid) Nasopharyngeal Swab     Status: None   Collection Time: 01/02/22 12:32 AM   Specimen: Nasopharyngeal Swab; Nasopharyngeal(NP) swabs in vial transport medium  Result Value Ref Range Status   SARS Coronavirus 2 by RT PCR NEGATIVE NEGATIVE Final    Comment: (NOTE) SARS-CoV-2 target nucleic acids are NOT DETECTED.  The SARS-CoV-2 RNA is generally detectable in upper respiratory specimens during the acute  phase of infection. The lowest concentration of SARS-CoV-2 viral copies this assay can detect is 138 copies/mL. A negative result does not preclude SARS-Cov-2 infection and should not be used as the sole basis for treatment or other patient management decisions. A negative result may occur with  improper specimen collection/handling, submission of specimen other than nasopharyngeal swab, presence of viral mutation(s) within the areas targeted by this assay, and inadequate number of viral copies(<138 copies/mL). A negative result must be combined with clinical observations, patient history, and epidemiological information. The expected result is Negative.  Fact Sheet for Patients:  EntrepreneurPulse.com.au  Fact Sheet for Healthcare Providers:   IncredibleEmployment.be  This test is no t yet approved or cleared by the [place] FDA and  has been authorized for detection and/or diagnosis of SARS-CoV-2 by FDA under an Emergency Use Authorization (EUA). This EUA will remain  in effect (meaning this test can be used) for the duration of the COVID-19 declaration under Section 564(b)(1) of the Act, 21 U.S.C.section 360bbb-3(b)(1), unless the authorization is terminated  or revoked sooner.       Influenza A by PCR NEGATIVE NEGATIVE Final   Influenza B by PCR NEGATIVE NEGATIVE Final    Comment: (NOTE) The Xpert Xpress SARS-CoV-2/FLU/RSV plus assay is intended as an aid in the diagnosis of influenza from Nasopharyngeal swab specimens and should not be used as a sole basis for treatment. Nasal washings and aspirates are unacceptable for Xpert Xpress SARS-CoV-2/FLU/RSV testing.  Fact Sheet for Patients: EntrepreneurPulse.com.au  Fact Sheet for Healthcare Providers: IncredibleEmployment.be  This test is not yet approved or cleared by the [place] FDA and has been authorized for detection and/or diagnosis of SARS-CoV-2 by FDA under an Emergency Use Authorization (EUA). This EUA will remain in effect (meaning this test can be used) for the duration of the COVID-19 declaration under Section 564(b)(1) of the Act, 21 U.S.C. section 360bbb-3(b)(1), unless the authorization is terminated or revoked.  Performed at [facility], [place] [address] [place], [place] [address]   MRSA Next Gen by PCR, Nasal     Status: None   Collection Time: 01/02/22  2:51 PM   Specimen: Nasal Mucosa; Nasal Swab  Result Value Ref Range Status   MRSA by PCR Next Gen NOT DETECTED NOT DETECTED Final    Comment: (NOTE) The GeneXpert MRSA Assay (FDA approved for NASAL specimens only), is one component of a comprehensive MRSA colonization surveillance program. It is not intended to diagnose  MRSA infection nor to guide or monitor treatment for MRSA infections. Test performance is not FDA approved in patients less than 22 years old. Performed at [facility], [place] [address], [place], [place] [address]     Radiology Studies:  No results found.  Scheduled Meds:    amLODipine  10 mg Oral Daily   apixaban  2.5 mg Oral BID   carvedilol  12.5 mg Oral BID WC   Chlorhexidine Gluconate Cloth  6 each Topical Daily   cilostazol  100 mg Oral BID   DULoxetine  20 mg Oral Daily   feeding supplement  237 mL Oral Daily   hydrALAZINE  100 mg Oral Q8H   isosorbide mononitrate  60 mg Oral Daily   levothyroxine  50 mcg Oral QAC breakfast   LORazepam  0.5 mg Oral BID   mirtazapine  15 mg Oral QHS   rosuvastatin  20 mg Oral Daily    Continuous Infusions:    cefTRIAXone (ROCEPHIN)  IV Stopped (  01/06/22 0354)     LOS: 3 days     Vernell Leep, MD,  FACP, [facility], [facility], [facility] (Care Management Physician Certified) [place]  To contact the attending provider between 7A-7P or the covering provider during after hours 7P-7A, please log into the web site [email or web address] and access using universal [place] password for that web site. If you do not have the password, please call the hospital operator.  01/06/2022, 5:33 AM

## 2022-01-05 NOTE — Plan of Care (Signed)

## 2022-01-05 NOTE — Progress Notes (Addendum)
Progress Note  Patient Name: [name] Date of Encounter: 01/05/2022  [facility] Cardiologist: Mertie Moores, MD   Subjective   Interview conducted with the use of a Spanish interpreter. Patient unable to speak due to history of total laryngectomy, but nodded/shook her head to answer questions. Denied chest pain, trouble breathing. Continues to have headache and nausea that have not improved.   BP continues to be labile. SBP has ranged from 122-222 overnight.   Inpatient Medications    Scheduled Meds:  amLODipine  10 mg Oral Daily   apixaban  2.5 mg Oral BID   carvedilol  12.5 mg Oral BID WC   Chlorhexidine Gluconate Cloth  6 each Topical Daily   DULoxetine  20 mg Oral Daily   hydrALAZINE  75 mg Oral Q8H   levothyroxine  50 mcg Oral QAC breakfast   mirtazapine  15 mg Oral QHS   rosuvastatin  20 mg Oral Daily   Continuous Infusions:  cefTRIAXone (ROCEPHIN)  IV Stopped (01/05/22 0245)   PRN Meds: acetaminophen **OR** acetaminophen, labetalol, ondansetron **OR** ondansetron (ZOFRAN) IV   Vital Signs    Vitals:   01/05/22 0700 01/05/22 0715 01/05/22 0730 01/05/22 0747  BP: (!) 223/82 (!) 222/81 (!) 217/83 (!) 165/83  Pulse: 68 61 70 77  Resp: 16 13 20 20   Temp:      TempSrc:      SpO2: 96% 95% 97% 95%  Weight:      Height:        Intake/Output Summary (Last 24 hours) at 01/05/2022 0903 Last data filed at 01/05/2022 0730 Gross per 24 hour  Intake 160 ml  Output 700 ml  Net -540 ml   Last 3 Weights 01/05/2022 01/04/2022 01/03/2022  Weight (lbs) 121 lb 4.1 oz 122 lb 5.7 oz 122 lb 9.2 oz  Weight (kg) 55 kg 55.5 kg 55.6 kg      Telemetry    Sinus rhythm, HR in the 60s - Personally Reviewed  ECG    No new tracings since 1/21 - Personally Reviewed  Physical Exam   GEN: No acute distress, chronically ill appearing   Neck: No JVD, Tracheostomy scar c/d/I. No bruits  Cardiac: RRR, no murmurs, rubs, or gallops.  Respiratory: Clear to auscultation  bilaterally. GI: Soft, nontender, non-distended  MS: No edema; No deformity. Neuro:  Nonfocal  Psych: Normal affect   Labs    High Sensitivity Troponin:   Recent Labs  Lab 01/02/22 0034 01/02/22 0235  TROPONINIHS 1,457* 1,357*     Chemistry Recent Labs  Lab 01/02/22 0034 01/02/22 0811 01/03/22 0740 01/04/22 0228 01/05/22 0147  NA 140 139 138 138 139  K 5.0 4.4 4.0 3.9 3.9  CL 106 109 106 105 107  CO2 23 19* 21* 19* 21*  GLUCOSE 116* 110* 142* 142* 108*  BUN 66* 61* 54* 47* 62*  CREATININE 2.70* 2.67* 2.68* 2.62* 3.44*  CALCIUM 9.1 8.9 9.5 9.4 9.1  MG  --  1.7  --   --  2.0  PROT 5.9* 5.3*  --   --   --   ALBUMIN 3.1* 2.8*  --   --   --   AST 34 19  --   --   --   ALT 14 13  --   --   --   ALKPHOS 104 88  --   --   --   BILITOT 0.9 0.2*  --   --   --   GFRNONAA 18*  18* 18* 18* 13*  ANIONGAP 11 11 11 14 11     Lipids No results for input(s): CHOL, TRIG, HDL, LABVLDL, LDLCALC, CHOLHDL in the last 168 hours.  Hematology Recent Labs  Lab 01/03/22 0036 01/04/22 0228 01/05/22 0147  WBC 9.5 13.8* 11.2*  RBC 3.58* 3.78* 3.74*  HGB 9.9* 10.7* 10.7*  HCT 30.6* 31.9* 32.5*  MCV 85.5 84.4 86.9  MCH 27.7 28.3 28.6  MCHC 32.4 33.5 32.9  RDW 13.7 13.9 14.0  PLT 170 250 205   Thyroid  Recent Labs  Lab 01/02/22 1528  TSH 3.283    BNP Recent Labs  Lab 01/02/22 0034  BNP 49.3    DDimer No results for input(s): DDIMER in the last 168 hours.   Radiology    CT HEAD WO CONTRAST (5MM)  Result Date: 01/03/2022 CLINICAL DATA:  Hypertensive emergency, altered mental status. EXAM: CT HEAD WITHOUT CONTRAST TECHNIQUE: Contiguous axial images were obtained from the base of the skull through the vertex without intravenous contrast. RADIATION DOSE REDUCTION: This exam was performed according to the departmental dose-optimization program which includes automated exposure control, adjustment of the mA and/or kV according to patient size and/or use of iterative reconstruction  technique. COMPARISON:  01/02/2022. FINDINGS: Brain: No acute intracranial hemorrhage, midline shift or mass effect. No extra-axial fluid collection. Mild periventricular white matter hypodensities are seen bilaterally. No hydrocephalus. Mild atrophy is noted. Vascular: No hyperdense vessel or unexpected calcification. Skull: Normal. Negative for fracture or focal lesion. Sinuses/Orbits: No acute finding. Other: None. IMPRESSION: Stable head CT with no acute intracranial process. Electronically Signed   By: Brett Fairy M.D.   On: 01/03/2022 21:22    Cardiac Studies   Echo  01/02/22    1. Left ventricular ejection fraction, by estimation, is 60 to 65%. The  left ventricle has normal function. The left ventricle has no regional  wall motion abnormalities. There is mild concentric left ventricular  hypertrophy. Left ventricular diastolic  parameters are consistent with Grade I diastolic dysfunction (impaired  relaxation).   2. Right ventricular systolic function is normal. The right ventricular  size is normal. Tricuspid regurgitation signal is inadequate for assessing  PA pressure.   3. Left atrial size was moderately dilated.   4. The mitral valve is grossly normal. No evidence of mitral valve  regurgitation.   5. The aortic valve is grossly normal. Aortic valve regurgitation is not  visualized.   Comparison(s): No significant change from prior study.     Patient Profile     76 y.o. female with a hx of poorly controlled HTN, HLD, PAF (no oac?), PAD, CKD4 (bl sCr ~2.5), Laryngeal SCC s/p Laryngectomy and chemo/XRT, hypothyroidism, prior DVT, COPD, prior tobacco abuse, GERD and MDD who is being seen 01/02/2022 for the evaluation of hypertensive emergency at the request of Dr. Betsey Holiday.  Assessment & Plan    Hypertensive emergency:  Hx of poorly controlled HTN .  Presented with markedly elevated BP and troponin elevation 1457. - Patient required Cleviprex drip and ICU admission overnight on  1/21-1/22 after marked hypertension after vomiting.  SBP fell to 130s, gtt discontinued on 1/22.   - BP remains labile, SBP ranged from 120s-220s overnight.   -Continue carvedilol 12.5 bid, amlodipine 10 mg daily. Consider increasing hydralazine to 100 mg TID or adding Imdur (was on at home) - Consider renal artery ultrasound  - Patient complains of headache, CT head on 1/21 showed no acute intracranial process . Continue tylenol    Elevated  troponin: Up to 1457.  Suspect demand ischemia in setting of markedly elevated hypertension.  Echocardiogram showed normal systolic function. Denies chest pain on 1/23 -Discontinued heparin drip on 1/21. Patient was previously on Eliquis for history of atrial fibrillation and DVT, was discontinued for unclear reasons.  Spoke with patient and her son, denies any history of falls or bleeding issues.  Head CT unremarkable.  - Restarted Eliquis 2.5 mg BID on 1/21   PAF: Remains in sinus rhythm per telemetry, EKG - CHA2DS2-VASc score 5 (hypertension, age x2, vascular disease, female) - Continue Eliquis 2.5 mg BID, reduced dose given weight and renal function - TSH normal this admission    CKD  Stage IV: Baseline Cr ~2.5, was 3.44 today - Continue to avoid nephrotoxic agents   Hx PAD - Continue statin    Laryngeal Ca, s/p laryngectomy and chemo/XRT in 2013     Hx DVT - Anticoagulation with eliquis    Hx COPD     For questions or updates, please contact [facility] Please consult [email or web address] for contact info under        Signed, Margie Billet, PA-C  01/05/2022, 9:03 AM

## 2022-01-05 NOTE — Progress Notes (Addendum)
Physical Therapy Treatment Patient Details Name: [name] MRN: [id number] DOB: 1946-03-10 Today's Date: 01/05/2022   History of Present Illness 76 year old Hispanic female admitted 01/02/22 with HA, periorbital edema, and SBP>250. Elevated troponin; IV heparin started; 1/21 move to ICU for IV Cleviprix. PMHx: head/ neck CA status post laryngectomy, CKD stage IV, HTN, chronic  back pain, COPD    PT Comments    Pt pleasant, able to communicate with some English and utilizing google translate to assist with instruction and direction as pt with laryngectomy and writing for communication as well as mouthing words or gestures. 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 Pt with limited activity tolerance but denies fatigue or dizziness. Pt educated for activity progression and need to continue to progress gait without physical assist.   BP 166/60 sitting , HR 75 97% on RA    Recommendations for follow up therapy are one component of a multi-disciplinary discharge planning process, led by the attending physician.  Recommendations may be updated based on patient status, additional functional criteria and insurance authorization.  Follow Up Recommendations  Home health PT     Assistance Recommended at Discharge Intermittent Supervision/Assistance  Patient can return home with the following A little help with walking and/or transfers;A little help with bathing/dressing/bathroom;Assistance with cooking/housework   Equipment Recommendations  Rolling walker (2 wheels)    Recommendations for Other Services       Precautions / Restrictions Precautions Precautions: Fall     Mobility  Bed Mobility Overal bed mobility: Modified Independent                  Transfers Overall transfer level: Modified independent                      Ambulation/Gait Ambulation/Gait assistance: Min guard Gait Distance (Feet): 150  Feet Assistive device: 1 person hand held assist Gait Pattern/deviations: Step-through pattern, Decreased stride length   Gait velocity interpretation: 1.31 - 2.62 ft/sec, indicative of limited community ambulator   General Gait Details: pt with limited single UE support for gait as pt denied attempting use of RW and nods head that she does furniture walk at times. Pt denied further gait but denied fatigue and unable to qualify limited distance   Stairs             Wheelchair Mobility    Modified Rankin (Stroke Patients Only)       Balance Overall balance assessment: Needs assistance   Sitting balance-Leahy Scale: Fair       Standing balance-Leahy Scale: Fair Standing balance comment: able to static stand without assist, single UE support for gait                            Cognition Arousal/Alertness: Awake/alert Behavior During Therapy: WFL for tasks assessed/performed Overall Cognitive Status: Difficult to assess                                 General Comments: pt with laryngectomy and difficult to communicate with as writing in spanish and requires translation with google translate as stratus ipad audio feature not working        Engineer, manufacturing Exercises - Lower Extremity Long Arc Quad: AROM, Both, Seated, 10 reps Hip Flexion/Marching: AROM, Both, Seated, 10 reps    General Comments  Pertinent Vitals/Pain Pain Assessment Pain Assessment: No/denies pain    Home Living                          Prior Function            PT Goals (current goals can now be found in the care plan section) Progress towards PT goals: Progressing toward goals    Frequency    Min 3X/week      PT Plan Current plan remains appropriate    Co-evaluation              AM-PAC PT "6 Clicks" Mobility   Outcome Measure  Help needed turning from your back to your side while in a flat bed without using bedrails?:  None Help needed moving from lying on your back to sitting on the side of a flat bed without using bedrails?: None Help needed moving to and from a bed to a chair (including a wheelchair)?: A Little Help needed standing up from a chair using your arms (e.g., wheelchair or bedside chair)?: A Little Help needed to walk in hospital room?: A Little Help needed climbing 3-5 steps with a railing? : A Lot 6 Click Score: 19    End of Session   Activity Tolerance: Patient tolerated treatment well Patient left: in chair;with call bell/phone within reach Nurse Communication: Mobility status PT Visit Diagnosis: Other abnormalities of gait and mobility (R26.89);Muscle weakness (generalized) (M62.81)     Time: 5974-1638 PT Time Calculation (min) (ACUTE ONLY): 26 min  Charges:  $Gait Training: 8-22 mins $Therapeutic Exercise: 8-22 mins                     Azaliah Carrero P, PT Acute Rehabilitation Services Pager: [phone] Office: [place] 01/05/2022, 9:44 AM

## 2022-01-05 NOTE — Assessment & Plan Note (Signed)
Stable

## 2022-01-05 NOTE — Progress Notes (Signed)
OT Cancellation Note  Patient Details Name: [name] MRN: [id number] DOB: 10-Jan-1946   Cancelled Treatment:    Reason Eval/Treat Not Completed: Other (comment) pt with progressive headache with elevated BP, just given meds and assisted back to bed. RN requests to allow pt to rest and follow-up for OT session again this afternoon as schedule permits.   Layla Maw 01/05/2022, 12:15 PM

## 2022-01-05 NOTE — Assessment & Plan Note (Addendum)
Secondary to malignant hypertension Off of Cleviprex drip Ongoing antihypertensive medication adjustments by cardiology. Carvedilol now increased to 25 Mg twice daily.  Continue amlodipine 10 Mg daily, hydralazine 100 mg 3 times daily and Imdur 60 Mg daily. BP control labile but much improved compared to 24 to 48 hours ago. Remains on as needed IV labetalol. As per cardiology recommendations, ordered renal artery duplex to rule out renal artery stenosis. Monitor closely and adjust antihypertensives as needed. Headache, nausea and vomiting likely related to uncontrolled hypertension.  Headaches reportedly new for the last week or so.  Avoiding NSAIDs.  As needed low-dose Oxy IR.  Nausea and vomiting improved but nausea not completely resolved. Upon discharge, outpatient follow-up with hypertension clinic with Dr. Oval Linsey. Given headache, checked ESR which is only mildly elevated at 50. Renal artery Doppler: No evidence of right renal artery stenosis.  1 to 59% stenosis of the left renal artery- review with cardiology if this needs to be intervened on.

## 2022-01-05 NOTE — Hospital Course (Addendum)
76 year old female with extensive medical history including but not limited to head and neck cancer s/p total laryngectomy, stage IV CKD, HTN, HLD, PAD, prior stroke, COPD and depression who presented to the ED 01/02/2022 for headache and periorbital edema.  Family reported that her blood pressure was extremely elevated prior to ED arrival.  Initially admitted to [facility] for HTN management.  On 01/03/2022, patient began to vomit with rebound hypertension and given labile blood pressures, patient was transferred to ICU under CCM care.  Blood pressures improved after initiation of Cleviprex drip and oral antihypertensive med management.  Care transferred to [facility] on 1/23 but blood pressures continue to remain high.  Cardiology assisting with med management.

## 2022-01-06 ENCOUNTER — Inpatient Hospital Stay (HOSPITAL_COMMUNITY): Payer: Medicare Other

## 2022-01-06 ENCOUNTER — Telehealth: Payer: Self-pay

## 2022-01-06 DIAGNOSIS — I161 Hypertensive emergency: Secondary | ICD-10-CM | POA: Diagnosis not present

## 2022-01-06 DIAGNOSIS — I160 Hypertensive urgency: Secondary | ICD-10-CM

## 2022-01-06 DIAGNOSIS — R7989 Other specified abnormal findings of blood chemistry: Secondary | ICD-10-CM | POA: Diagnosis not present

## 2022-01-06 LAB — BASIC METABOLIC PANEL
Anion gap: 12 (ref 5–15)
BUN: 58 mg/dL — ABNORMAL HIGH (ref 8–23)
CO2: 18 mmol/L — ABNORMAL LOW (ref 22–32)
Calcium: 8.7 mg/dL — ABNORMAL LOW (ref 8.9–10.3)
Chloride: 105 mmol/L (ref 98–111)
Creatinine, Ser: 3.16 mg/dL — ABNORMAL HIGH (ref 0.44–1.00)
GFR, Estimated: 15 mL/min — ABNORMAL LOW (ref >60)
Glucose, Bld: 138 mg/dL — ABNORMAL HIGH (ref 70–99)
Potassium: 3.9 mmol/L (ref 3.5–5.1)
Sodium: 135 mmol/L (ref 135–145)

## 2022-01-06 LAB — SEDIMENTATION RATE: Sed Rate: 50 mm/hr — ABNORMAL HIGH (ref 0–22)

## 2022-01-06 MED ORDER — CARVEDILOL 25 MG PO TABS
25.0000 mg | ORAL_TABLET | Freq: Two times a day (BID) | ORAL | Status: DC
  Administered 2022-01-06 – 2022-01-08 (×4): 25 mg via ORAL
  Filled 2022-01-06 (×4): qty 1

## 2022-01-06 MED ORDER — POLYETHYLENE GLYCOL 3350 17 G PO PACK
17.0000 g | PACK | Freq: Every day | ORAL | Status: DC
  Administered 2022-01-06: 14:00:00 17 g via ORAL
  Filled 2022-01-06 (×3): qty 1

## 2022-01-06 MED ORDER — DOCUSATE SODIUM 100 MG PO CAPS
100.0000 mg | ORAL_CAPSULE | Freq: Every day | ORAL | Status: DC
  Administered 2022-01-06 – 2022-01-08 (×2): 100 mg via ORAL
  Filled 2022-01-06 (×3): qty 1

## 2022-01-06 NOTE — Progress Notes (Signed)
Renal artery duplex completed. Refer to "CV Proc" under chart review to view preliminary results.  01/06/2022 10:14 AM Kelby Aline., MHA, RVT, RDCS, RDMS

## 2022-01-06 NOTE — Telephone Encounter (Signed)
Spoke with [name] in regards to her mother in [place] xray results. She then informed me that the pt had a heart attack on Thursday and is currently admitted at the hospital. She was told by one of the doctors in the hospital that there was nothing they could do for her blood pressure and that she would be discharged soon. [name] is concerned that if her mother in law comes home will the same thing happen, she mentioned the pt is constantly complaining about having headaches and her blood pressure is still elevated in the 190s

## 2022-01-06 NOTE — Progress Notes (Signed)
OT Cancellation Note  Patient Details Name: BETHSAIDA SIEGENTHALER MRN: [id number] DOB: May 25, 1946   Cancelled Treatment:    Reason Eval/Treat Not Completed: Patient declined, no reason specified Pt initially agreeable to light therapeutic activities. However, upon initiating tasks, pt declined to participate though did not give reason. OT inquired if it was ok to check back today though pt shrugged shoulders. Will follow-up as schedule permits.   Layla Maw 01/06/2022, 8:04 AM

## 2022-01-06 NOTE — TOC Progression Note (Addendum)
Transition of Care [facility]) - Progression Note    Patient Details  Name: [name] MRN: [id number] Date of Birth: 09/21/1946  Transition of Care [facility] CM/SW [place], RN Phone Number:[phone]  01/06/2022, 8:37 AM  Clinical Narrative:    CM spoke with son Frankey Poot to offer choice for [facility] services. Frankey Poot is ok with any agency that is able to provide services. HH referral called to [place] with [facility]. Acceptance pending awaiting orders. MD will need to place [facility] orders [facility] PT & Pt may benefit from [facility] RN for medication management)   Expected Discharge Plan: [place] Barriers to Discharge: Continued Medical Work up  Expected Discharge Plan and Services Expected Discharge Plan: [place] In-house Referral: NA Discharge Planning Services: CM Consult Post Acute Care Choice: Home Health                   DME Arranged: N/A DME Agency: NA       HH Arranged: RN           Social Determinants of Health (SDOH) Interventions    Readmission Risk Interventions Readmission Risk Prevention Plan 01/16/2021 01/02/2021  Transportation Screening Complete Complete  PCP or Specialist Appt within 3-5 Days Complete Complete  HRI or Home Care Consult Complete Complete  Social Work Consult for [place] Planning/Counseling Complete Complete  Palliative Care Screening Not Applicable Not Applicable  Medication Review Press photographer) Complete Complete  Some recent data might be hidden

## 2022-01-06 NOTE — Telephone Encounter (Signed)
I do not have control at the hospital but they do have patient navigators that can help facilitate discussion between patients/family and the doctors/nurses etc to help resolve communication differences. I agree her blood pressure should be controlled before she leaves the hospital.

## 2022-01-06 NOTE — Progress Notes (Signed)
PROGRESS NOTE   [name]  ZOX:[id number]    DOB: [birth year]    DOA: 01/01/2022  PCP: Hoyt Koch, MD   I have briefly reviewed patients previous medical records in [facility].  Chief Complaint  Patient presents with   Hypertension    Hospital Course:  76 year old female with extensive medical history including but not limited to head and neck cancer s/p total laryngectomy, stage IV CKD, HTN, HLD, PAD, prior stroke, COPD and depression who presented to the ED 01/02/2022 for headache and periorbital edema.  Family reported that her blood pressure was extremely elevated prior to ED arrival.  Initially admitted to [facility] for HTN management.  On 01/03/2022, patient began to vomit with rebound hypertension and given labile blood pressures, patient was transferred to ICU under CCM care.  Blood pressures improved after initiation of Cleviprex drip and oral antihypertensive med management.  Care transferred to [facility] on 1/23 but blood pressures continue to remain high.  Cardiology assisting with med management.   Assessment & Plan:  Principal Problem:   Hypertensive emergency Active Problems:   Elevated troponin due to demand ischemia   PAF (paroxysmal atrial fibrillation)-CHADS2 Vas score of 5   CKD stage 4 secondary to hypertension (HCC)   GAD (generalized anxiety disorder)   COPD (chronic obstructive pulmonary disease) with chronic bronchitis (HCC)   Hypothyroidism   History of head and neck cancer-s/p laryngectomy-with tracheostomy   History of DVT (deep vein thrombosis)   Anemia in CKD (chronic kidney disease)  Hypertensive emergency-kindly see discussion below.  Elevated troponin due to demand ischemia Troponin peaked to 1457 Cardiology on board. Suspect demand ischemia in the setting of hypertensive emergency.  No anginal symptoms. 2D echo shows normal LVEF. Briefly on IV heparin drip, since discontinued.  CKD stage 4 secondary to hypertension (HCC) Acute kidney  injury complicating stage IV chronic kidney disease  Baseline creatinine approximately 2.5. Creatinine up to 3.44 on 1/23.  This is down to 3.16 on 1/24 and hopefully will get to her baseline soon. AKI may be related to hemodynamics from hypertensive emergency. Avoid nephrotoxic's, NSAIDs discontinued 1/23. Trend daily BMP.   History of head and neck cancer-s/p laryngectomy-with tracheostomy Chronic issue-stable for outpatient follow-up with ENT/oncology.  S/P laryngectomy Chronic.  COPD (chronic obstructive pulmonary disease) with chronic bronchitis (HCC) Stable-continue with bronchodilators.  PAF (paroxysmal atrial fibrillation)-CHADS2 Vas score of 5 Cardiology on board. CHA2DS2-VASc score: 5 Briefly on IV heparin, now on reduced dose Eliquis 2.5 Mg twice daily due to renal insufficiency TSH normal Remains in sinus rhythm on telemetry..  Hypothyroidism Continue levothyroxine.  GAD (generalized anxiety disorder) Depression: Resume lorazepam at half of home dose-continue duloxetine.  Hypertensive emergency Secondary to malignant hypertension Off of Cleviprex drip Ongoing antihypertensive medication adjustments by cardiology. Carvedilol now increased to 25 Mg twice daily.  Continue amlodipine 10 Mg daily, hydralazine 100 mg 3 times daily and Imdur 60 Mg daily. BP control labile but much improved compared to 24 to 48 hours ago. Remains on as needed IV labetalol. As per cardiology recommendations, ordered renal artery duplex to rule out renal artery stenosis. Monitor closely and adjust antihypertensives as needed. Headache, nausea and vomiting likely related to uncontrolled hypertension.  Headaches reportedly new for the last week or so.  Avoiding NSAIDs.  As needed low-dose Oxy IR.  Nausea and vomiting improved but nausea not completely resolved. Upon discharge, outpatient follow-up with hypertension clinic with Dr. Oval Linsey. Given headache, checked ESR which is only mildly  elevated  at 37. Renal artery Doppler: No evidence of right renal artery stenosis.  1 to 59% stenosis of the left renal artery- review with cardiology if this needs to be intervened on.  History of DVT (deep vein thrombosis) Continue reduced dose Eliquis.  Anemia in CKD (chronic kidney disease) Stable    Body mass index is 20.81 kg/m.     DVT prophylaxis: apixaban (ELIQUIS) tablet 2.5 mg Start: 01/03/22 1315 SCDs Start: 01/02/22 0657     Code Status: Full Code:  Family Communication: Discussed in detail with patient's son via phone early this morning.  Subsequently he came to the hospital room to visit his mother and asked to speak with me.  I was unable to get there immediately because I was caring for other patients.  By the time I went up in a couple of hours, he had already left. Disposition:  Status is: Inpatient  Remains inpatient appropriate because: Severity of illness        Consultants:   PCCM Cardiology  Procedures:   None  Antimicrobials:   None   Subjective:  Again limited history due to having a tracheostomy and inability to speak.  Indicates that she feels so-so and has a headache this morning.  As per nursing, had an episode of emesis yesterday but none since.  Eating some.  Headache better after low-dose Oxy IR.  Had nausea this morning without vomiting.  Objective:   Vitals:   01/06/22 1134 01/06/22 1200 01/06/22 1410 01/06/22 1500  BP:  134/60 (!) 141/54   Pulse:  93 93 91  Resp:  (!) 21 (!) 21 (!) 21  Temp: 98.5 F (36.9 C)     TempSrc:      SpO2:  96% 96% 91%  Weight:      Height:        General exam: Elderly female, small built and frail, chronically ill looking lying comfortably propped up in bed without distress. Neck: Hole of tracheostomy at base of neck and midline without any device in it. Respiratory system: Clear to auscultation.  No increased work of breathing. Cardiovascular system: S1 and S2 heard, RRR.  No JVD, murmurs or  pedal edema.  Telemetry personally reviewed: Sinus rhythm. Gastrointestinal system: Abdomen is nondistended, soft and nontender. No organomegaly or masses felt. Normal bowel sounds heard. Central nervous system: Seems alert and oriented. No focal neurological deficits. Extremities: Symmetric 5 x 5 power. Skin: No rashes, lesions or ulcers Psychiatry: Judgement and insight appear normal. Mood & affect appropriate.     Data Reviewed:   I have personally reviewed following labs and imaging studies   CBC: Recent Labs  Lab 01/02/22 0034 01/02/22 0811 01/03/22 0036 01/04/22 0228 01/05/22 0147  WBC 10.6* 9.3 9.5 13.8* 11.2*  NEUTROABS 7.4 6.1  --   --   --   HGB 10.5* 9.7* 9.9* 10.7* 10.7*  HCT 32.1* 29.9* 30.6* 31.9* 32.5*  MCV 87.5 89.0 85.5 84.4 86.9  PLT 221 180 170 250 361    Basic Metabolic Panel: Recent Labs  Lab 01/02/22 0811 01/03/22 0740 01/04/22 0228 01/05/22 0147 01/06/22 0219  NA 139 138 138 139 135  K 4.4 4.0 3.9 3.9 3.9  CL 109 106 105 107 105  CO2 19* 21* 19* 21* 18*  GLUCOSE 110* 142* 142* 108* 138*  BUN 61* 54* 47* 62* 58*  CREATININE 2.67* 2.68* 2.62* 3.44* 3.16*  CALCIUM 8.9 9.5 9.4 9.1 8.7*  MG 1.7  --   --  2.0  --  PHOS  --   --   --  7.3*  --     Liver Function Tests: Recent Labs  Lab 01/02/22 0034 01/02/22 0811  AST 34 19  ALT 14 13  ALKPHOS 104 88  BILITOT 0.9 0.2*  PROT 5.9* 5.3*  ALBUMIN 3.1* 2.8*    CBG: Recent Labs  Lab 01/03/22 2001  GLUCAP 156*    Microbiology Studies:   Recent Results (from the past 240 hour(s))  Resp Panel by RT-PCR (Flu A&B, Covid) Nasopharyngeal Swab     Status: None   Collection Time: 01/02/22 12:32 AM   Specimen: Nasopharyngeal Swab; Nasopharyngeal(NP) swabs in vial transport medium  Result Value Ref Range Status   SARS Coronavirus 2 by RT PCR NEGATIVE NEGATIVE Final    Comment: (NOTE) SARS-CoV-2 target nucleic acids are NOT DETECTED.  The SARS-CoV-2 RNA is generally detectable in upper  respiratory specimens during the acute phase of infection. The lowest concentration of SARS-CoV-2 viral copies this assay can detect is 138 copies/mL. A negative result does not preclude SARS-Cov-2 infection and should not be used as the sole basis for treatment or other patient management decisions. A negative result may occur with  improper specimen collection/handling, submission of specimen other than nasopharyngeal swab, presence of viral mutation(s) within the areas targeted by this assay, and inadequate number of viral copies(<138 copies/mL). A negative result must be combined with clinical observations, patient history, and epidemiological information. The expected result is Negative.  Fact Sheet for Patients:  EntrepreneurPulse.com.au  Fact Sheet for Healthcare Providers:  IncredibleEmployment.be  This test is no t yet approved or cleared by the [place] FDA and  has been authorized for detection and/or diagnosis of SARS-CoV-2 by FDA under an Emergency Use Authorization (EUA). This EUA will remain  in effect (meaning this test can be used) for the duration of the COVID-19 declaration under Section 564(b)(1) of the Act, 21 U.S.C.section 360bbb-3(b)(1), unless the authorization is terminated  or revoked sooner.       Influenza A by PCR NEGATIVE NEGATIVE Final   Influenza B by PCR NEGATIVE NEGATIVE Final    Comment: (NOTE) The Xpert Xpress SARS-CoV-2/FLU/RSV plus assay is intended as an aid in the diagnosis of influenza from Nasopharyngeal swab specimens and should not be used as a sole basis for treatment. Nasal washings and aspirates are unacceptable for Xpert Xpress SARS-CoV-2/FLU/RSV testing.  Fact Sheet for Patients: EntrepreneurPulse.com.au  Fact Sheet for Healthcare Providers: IncredibleEmployment.be  This test is not yet approved or cleared by the [place] FDA and has been  authorized for detection and/or diagnosis of SARS-CoV-2 by FDA under an Emergency Use Authorization (EUA). This EUA will remain in effect (meaning this test can be used) for the duration of the COVID-19 declaration under Section 564(b)(1) of the Act, 21 U.S.C. section 360bbb-3(b)(1), unless the authorization is terminated or revoked.  Performed at [facility], [place] [address] [place], [place] [address]   MRSA Next Gen by PCR, Nasal     Status: None   Collection Time: 01/02/22  2:51 PM   Specimen: Nasal Mucosa; Nasal Swab  Result Value Ref Range Status   MRSA by PCR Next Gen NOT DETECTED NOT DETECTED Final    Comment: (NOTE) The GeneXpert MRSA Assay (FDA approved for NASAL specimens only), is one component of a comprehensive MRSA colonization surveillance program. It is not intended to diagnose MRSA infection nor to guide or monitor treatment for MRSA infections. Test performance is not FDA approved in  patients less than 65 years old. Performed at [facility], [place] [address], [place], [place] [address]     Radiology Studies:  VAS US RENAL ARTERY DUPLEX  Result Date: 01/06/2022 ABDOMINAL VISCERAL Patient Name:  DAWSON HOLLMAN  Date of Exam:   01/06/2022 Medical Rec #: [id number]         Accession #:    [id number] Date of Birth: 07-13-1946        Patient Gender: F Patient Age:   27 years Exam Location:  [facility] Procedure:      VAS US RENAL ARTERY DUPLEX Referring Phys: [id number] Fynlee Rowlands D Ettel Albergo -------------------------------------------------------------------------------- Indications: Malignant hypertension Limitations: Air/bowel gas. Comparison Study: No prior study Performing Technologist: Maudry Mayhew MHA, RDMS, RVT, RDCS  Examination Guidelines: A complete evaluation includes B-mode imaging, spectral Doppler, color Doppler, and power Doppler as needed of all accessible portions of each vessel. Bilateral testing is considered an integral part of a complete  examination. Limited examinations for reoccurring indications may be performed as noted.  Duplex Findings: +--------------------+--------+--------+------+--------------------+  Mesenteric           PSV cm/s EDV cm/s Plaque       Comments        +--------------------+--------+--------+------+--------------------+  Aorta Prox              78       16                1.25cm AP        +--------------------+--------+--------+------+--------------------+  Aorta Mid                                     2.44cm AP, 2.8cm trv  +--------------------+--------+--------+------+--------------------+  Celiac Artery Origin   171                                          +--------------------+--------+--------+------+--------------------+  SMA Proximal           217       31                                 +--------------------+--------+--------+------+--------------------+    +------------------+--------+--------+------------------+  Right Renal Artery PSV cm/s EDV cm/s      Comment        +------------------+--------+--------+------------------+  Origin                               Unable to insonate  +------------------+--------+--------+------------------+  Proximal              60       20                        +------------------+--------+--------+------------------+  Mid                   71       19                        +------------------+--------+--------+------------------+  Distal                39       9                         +------------------+--------+--------+------------------+ +-----------------+--------+--------+------------------+  Left Renal Artery PSV cm/s EDV cm/s      Comment        +-----------------+--------+--------+------------------+  Origin                              Unable to insonate  +-----------------+--------+--------+------------------+  Proximal            160       14                        +-----------------+--------+--------+------------------+  Mid                  77       10                         +-----------------+--------+--------+------------------+  Distal               42       7                         +-----------------+--------+--------+------------------+ +------------+--------+--------+----+-----------+--------+--------+----+  Right Kidney PSV cm/s EDV cm/s RI   Left Kidney PSV cm/s EDV cm/s RI    +------------+--------+--------+----+-----------+--------+--------+----+  Upper Pole   14       4        0.69 Upper Pole  29       6        0.79  +------------+--------+--------+----+-----------+--------+--------+----+  Mid          15       3        0.81 Mid         30       5        0.82  +------------+--------+--------+----+-----------+--------+--------+----+  Lower Pole   12       4        0.64 Lower Pole  20       7        0.65  +------------+--------+--------+----+-----------+--------+--------+----+  Hilar        20       4        0.78 Hilar       42       6        0.85  +------------+--------+--------+----+-----------+--------+--------+----+ +------------------+---------+------------------+---------+  Right Kidney                 Left Kidney                   +------------------+---------+------------------+---------+  RAR                          RAR                           +------------------+---------+------------------+---------+  RAR (manual)       0.91      RAR (manual)       2.05       +------------------+---------+------------------+---------+  Cortex             12/3 cm/s Cortex             17/7 cm/s  +------------------+---------+------------------+---------+  Cortex thickness             Corex thickness               +------------------+---------+------------------+---------+  Kidney length (cm) 7.43      Kidney length (cm) 10.10      +------------------+---------+------------------+---------+  Summary: Renal:  Right: Cyst(s) noted. RRV flow present. No evidence of right renal        artery stenosis. Left:  Cyst(s) noted. LRV flow present. 1-59% stenosis of the left         renal artery. Abnormal left Resisitve Index.  *See table(s) above for measurements and observations.  Diagnosing physician: Deitra Mayo MD  Electronically signed by Deitra Mayo MD on 01/06/2022 at 2:21:36 PM.    Final     Scheduled Meds:    amLODipine  10 mg Oral Daily   apixaban  2.5 mg Oral BID   carvedilol  25 mg Oral BID WC   Chlorhexidine Gluconate Cloth  6 each Topical Daily   cilostazol  100 mg Oral BID   docusate sodium  100 mg Oral Daily   DULoxetine  20 mg Oral Daily   feeding supplement  237 mL Oral Daily   hydrALAZINE  100 mg Oral Q8H   isosorbide mononitrate  60 mg Oral Daily   levothyroxine  50 mcg Oral QAC breakfast   LORazepam  0.5 mg Oral BID   mirtazapine  15 mg Oral QHS   polyethylene glycol  17 g Oral Daily   rosuvastatin  20 mg Oral Daily    Continuous Infusions:    cefTRIAXone (ROCEPHIN)  IV Stopped (01/06/22 0354)     LOS: 3 days     Vernell Leep, MD,  FACP, [facility], [facility], [facility] (Care Management Physician Certified) [place]  To contact the attending provider between 7A-7P or the covering provider during after hours 7P-7A, please log into the web site [email or web address] and access using universal [place] password for that web site. If you do not have the password, please call the hospital operator.  01/06/2022, 3:28 PM

## 2022-01-06 NOTE — Progress Notes (Signed)
Progress Note  Patient Name: [name] Date of Encounter: 01/06/2022  [facility] Cardiologist: Mertie Moores, MD   Subjective   She still has some headaches. Was NPO and has some nausea. Bps remain labile but certainly improved from admission  Inpatient Medications    Scheduled Meds:  amLODipine  10 mg Oral Daily   apixaban  2.5 mg Oral BID   carvedilol  12.5 mg Oral BID WC   Chlorhexidine Gluconate Cloth  6 each Topical Daily   cilostazol  100 mg Oral BID   docusate sodium  100 mg Oral Daily   DULoxetine  20 mg Oral Daily   feeding supplement  237 mL Oral Daily   hydrALAZINE  100 mg Oral Q8H   isosorbide mononitrate  60 mg Oral Daily   levothyroxine  50 mcg Oral QAC breakfast   LORazepam  0.5 mg Oral BID   mirtazapine  15 mg Oral QHS   polyethylene glycol  17 g Oral Daily   rosuvastatin  20 mg Oral Daily   Continuous Infusions:  cefTRIAXone (ROCEPHIN)  IV Stopped (01/06/22 0354)   PRN Meds: acetaminophen **OR** acetaminophen, labetalol, ondansetron **OR** ondansetron (ZOFRAN) IV, oxyCODONE   Vital Signs    Vitals:   01/06/22 0900 01/06/22 0930 01/06/22 1000 01/06/22 1030  BP:   (!) 158/75   Pulse: 86 95 85 84  Resp: 16 (!) 28 (!) 22 18  Temp:      TempSrc:      SpO2: 92% 93% 94% 95%  Weight:      Height:        Intake/Output Summary (Last 24 hours) at 01/06/2022 1127 Last data filed at 01/06/2022 0400 Gross per 24 hour  Intake 520.16 ml  Output 400 ml  Net 120.16 ml   Last 3 Weights 01/05/2022 01/04/2022 01/03/2022  Weight (lbs) 121 lb 4.1 oz 122 lb 5.7 oz 122 lb 9.2 oz  Weight (kg) 55 kg 55.5 kg 55.6 kg      Telemetry    Sinus rhythm - Personally Reviewed  ECG    No new - Personally Reviewed  Physical Exam   Vitals:   01/06/22 1000 01/06/22 1030  BP: (!) 158/75   Pulse: 85 84  Resp: (!) 22 18  Temp:    SpO2: 94% 95%    GEN: No acute distress, chronically ill appearing  ; thin and frail Neck: No JVD, Tracheostomy, No bruits   Cardiac: RRR, no murmurs, rubs, or gallops.  Respiratory: Clear to auscultation bilaterally. GI: Soft, nontender, non-distended  MS: No edema; No deformity. Neuro:  Nonfocal  Psych: Normal affect   Labs    High Sensitivity Troponin:   Recent Labs  Lab 01/02/22 0034 01/02/22 0235  TROPONINIHS 1,457* 1,357*     Chemistry Recent Labs  Lab 01/02/22 0034 01/02/22 7408 01/03/22 0740 01/04/22 0228 01/05/22 0147 01/06/22 0219  NA 140 139   < > 138 139 135  K 5.0 4.4   < > 3.9 3.9 3.9  CL 106 109   < > 105 107 105  CO2 23 19*   < > 19* 21* 18*  GLUCOSE 116* 110*   < > 142* 108* 138*  BUN 66* 61*   < > 47* 62* 58*  CREATININE 2.70* 2.67*   < > 2.62* 3.44* 3.16*  CALCIUM 9.1 8.9   < > 9.4 9.1 8.7*  MG  --  1.7  --   --  2.0  --   PROT  5.9* 5.3*  --   --   --   --   ALBUMIN 3.1* 2.8*  --   --   --   --   AST 34 19  --   --   --   --   ALT 14 13  --   --   --   --   ALKPHOS 104 88  --   --   --   --   BILITOT 0.9 0.2*  --   --   --   --   GFRNONAA 18* 18*   < > 18* 13* 15*  ANIONGAP 11 11   < > 14 11 12    < > = values in this interval not displayed.    Lipids No results for input(s): CHOL, TRIG, HDL, LABVLDL, LDLCALC, CHOLHDL in the last 168 hours.  Hematology Recent Labs  Lab 01/03/22 0036 01/04/22 0228 01/05/22 0147  WBC 9.5 13.8* 11.2*  RBC 3.58* 3.78* 3.74*  HGB 9.9* 10.7* 10.7*  HCT 30.6* 31.9* 32.5*  MCV 85.5 84.4 86.9  MCH 27.7 28.3 28.6  MCHC 32.4 33.5 32.9  RDW 13.7 13.9 14.0  PLT 170 250 205   Thyroid  Recent Labs  Lab 01/02/22 1528  TSH 3.283    BNP Recent Labs  Lab 01/02/22 0034  BNP 49.3    DDimer No results for input(s): DDIMER in the last 168 hours.   Radiology    VAS US RENAL ARTERY DUPLEX  Result Date: 01/06/2022 ABDOMINAL VISCERAL Patient Name:  [name]  Date of Exam:   01/06/2022 Medical Rec #: [id number]         Accession #:    [id number] Date of Birth: 1946-02-17        Patient Gender: F Patient Age:   76 years Exam  Location:  [facility] Procedure:      VAS US RENAL ARTERY DUPLEX Referring Phys: [id number] ANAND D HONGALGI -------------------------------------------------------------------------------- Indications: Malignant hypertension Limitations: Air/bowel gas. Comparison Study: No prior study Performing Technologist: Maudry Mayhew MHA, RDMS, RVT, RDCS  Examination Guidelines: A complete evaluation includes B-mode imaging, spectral Doppler, color Doppler, and power Doppler as needed of all accessible portions of each vessel. Bilateral testing is considered an integral part of a complete examination. Limited examinations for reoccurring indications may be performed as noted.  Duplex Findings: +--------------------+--------+--------+------+--------------------+  Mesenteric           PSV cm/s EDV cm/s Plaque       Comments        +--------------------+--------+--------+------+--------------------+  Aorta Prox              78       16                1.25cm AP        +--------------------+--------+--------+------+--------------------+  Aorta Mid                                     2.44cm AP, 2.8cm trv  +--------------------+--------+--------+------+--------------------+  Celiac Artery Origin   171                                          +--------------------+--------+--------+------+--------------------+  SMA Proximal           217  31                                 +--------------------+--------+--------+------+--------------------+    +------------------+--------+--------+------------------+  Right Renal Artery PSV cm/s EDV cm/s      Comment        +------------------+--------+--------+------------------+  Origin                               Unable to insonate  +------------------+--------+--------+------------------+  Proximal              60       20                        +------------------+--------+--------+------------------+  Mid                   71       19                         +------------------+--------+--------+------------------+  Distal                39       9                         +------------------+--------+--------+------------------+ +-----------------+--------+--------+------------------+  Left Renal Artery PSV cm/s EDV cm/s      Comment        +-----------------+--------+--------+------------------+  Origin                              Unable to insonate  +-----------------+--------+--------+------------------+  Proximal            160       14                        +-----------------+--------+--------+------------------+  Mid                  77       10                        +-----------------+--------+--------+------------------+  Distal               42       7                         +-----------------+--------+--------+------------------+ +------------+--------+--------+----+-----------+--------+--------+----+  Right Kidney PSV cm/s EDV cm/s RI   Left Kidney PSV cm/s EDV cm/s RI    +------------+--------+--------+----+-----------+--------+--------+----+  Upper Pole   14       4        0.69 Upper Pole  29       6        0.79  +------------+--------+--------+----+-----------+--------+--------+----+  Mid          15       3        0.81 Mid         30       5        0.82  +------------+--------+--------+----+-----------+--------+--------+----+  Lower Pole   12       4        0.64 Lower Pole  20  7        0.65  +------------+--------+--------+----+-----------+--------+--------+----+  Hilar        20       4        0.78 Hilar       42       6        0.85  +------------+--------+--------+----+-----------+--------+--------+----+ +------------------+---------+------------------+---------+  Right Kidney                 Left Kidney                   +------------------+---------+------------------+---------+  RAR                          RAR                           +------------------+---------+------------------+---------+  RAR (manual)       0.91      RAR (manual)        2.05       +------------------+---------+------------------+---------+  Cortex             12/3 cm/s Cortex             17/7 cm/s  +------------------+---------+------------------+---------+  Cortex thickness             Corex thickness               +------------------+---------+------------------+---------+  Kidney length (cm) 7.43      Kidney length (cm) 10.10      +------------------+---------+------------------+---------+   Summary: Renal:  Right: Cyst(s) noted. RRV flow present. No evidence of right renal        artery stenosis. Left:  Cyst(s) noted. LRV flow present. 1-59% stenosis of the left        renal artery. Abnormal left Resisitve Index.  *See table(s) above for measurements and observations.     Preliminary     Cardiac Studies   Echo  01/02/22    1. Left ventricular ejection fraction, by estimation, is 60 to 65%. The  left ventricle has normal function. The left ventricle has no regional  wall motion abnormalities. There is mild concentric left ventricular  hypertrophy. Left ventricular diastolic  parameters are consistent with Grade I diastolic dysfunction (impaired  relaxation).   2. Right ventricular systolic function is normal. The right ventricular  size is normal. Tricuspid regurgitation signal is inadequate for assessing  PA pressure.   3. Left atrial size was moderately dilated.   4. The mitral valve is grossly normal. No evidence of mitral valve  regurgitation.   5. The aortic valve is grossly normal. Aortic valve regurgitation is not  visualized.   Comparison(s): No significant change from prior study.     Patient Profile     76 y.o. female with a hx of poorly controlled HTN, HLD, PAF (no oac?), PAD, CKD4 (bl sCr ~2.5), Laryngeal SCC s/p Laryngectomy and chemo/XRT, hypothyroidism, prior DVT, COPD, prior tobacco abuse, GERD and MDD who is being seen 01/02/2022 for the evaluation of hypertensive emergency at the request of Dr. Betsey Holiday.  Assessment & Plan     Hypertensive emergency:  Hx of poorly controlled HTN .  Presented with markedly elevated BP and troponin elevation 1457. - Patient required Cleviprex drip and ICU admission overnight on 1/21-1/22 after marked hypertension after vomiting.  SBP fell to 130s, gtt discontinued on 1/22.   - BP remains labile in the  setting of CKD.   -increased carvedilol 25 mg BID, amlodipine 10 mg daily. Continue hydralazine to 100 mg TID and imdur 60 mg daily - Patient complains of headache, CT head on 1/21 showed no acute intracranial process . No focal deficits. Continue tylenol  - will plan for outpatient FU with hypertension clinic with Dr. Oval Linsey  Elevated troponin: Up to 1457.  Suspect demand ischemia in setting of markedly elevated hypertension.  Echocardiogram showed normal systolic function. Denies chest pain on 1/23 -Discontinued heparin drip on 1/21. Patient was previously on Eliquis for history of atrial fibrillation and DVT, was discontinued for unclear reasons.  Spoke with patient and her son, denies any history of falls or bleeding issues.  Head CT unremarkable.  - Restarted Eliquis 2.5 mg BID on 1/21   PAF: Remains in sinus rhythm per telemetry, EKG - CHA2DS2-VASc score 5 (hypertension, age x2, vascular disease, female) - Continue Eliquis 2.5 mg BID, reduced dose given weight and renal function - TSH normal this admission    CKD  Stage IV: GFR ~18: contributing to her labile blood pressures   Hx PAD - Continue statin    Laryngeal Ca, s/p laryngectomy and chemo/XRT in 2013     Hx DVT - Anticoagulation with eliquis    Hx COPD - stable    For questions or updates, please contact [facility] Please consult [email or web address] for contact info under        Signed, Janina Mayo, MD  01/06/2022, 11:27 AM

## 2022-01-07 ENCOUNTER — Other Ambulatory Visit: Payer: Self-pay | Admitting: Internal Medicine

## 2022-01-07 DIAGNOSIS — R7989 Other specified abnormal findings of blood chemistry: Secondary | ICD-10-CM | POA: Diagnosis not present

## 2022-01-07 DIAGNOSIS — I161 Hypertensive emergency: Secondary | ICD-10-CM | POA: Diagnosis not present

## 2022-01-07 LAB — BASIC METABOLIC PANEL
Anion gap: 12 (ref 5–15)
BUN: 63 mg/dL — ABNORMAL HIGH (ref 8–23)
CO2: 20 mmol/L — ABNORMAL LOW (ref 22–32)
Calcium: 9.1 mg/dL (ref 8.9–10.3)
Chloride: 103 mmol/L (ref 98–111)
Creatinine, Ser: 3.45 mg/dL — ABNORMAL HIGH (ref 0.44–1.00)
GFR, Estimated: 13 mL/min — ABNORMAL LOW (ref >60)
Glucose, Bld: 112 mg/dL — ABNORMAL HIGH (ref 70–99)
Potassium: 3.8 mmol/L (ref 3.5–5.1)
Sodium: 135 mmol/L (ref 135–145)

## 2022-01-07 LAB — ALDOSTERONE + RENIN ACTIVITY W/ RATIO
ALDO / PRA Ratio: 1.5 (ref 0.0–30.0)
Aldosterone: 1.6 ng/dL (ref 0.0–30.0)
PRA LC/MS/MS: 1.093 ng/mL/hr (ref 0.167–5.380)

## 2022-01-07 NOTE — Progress Notes (Signed)
Physical Therapy Treatment Patient Details Name: [name] MRN: [id number] DOB: 12-31-1945 Today's Date: 01/07/2022   History of Present Illness 76 year old Hispanic female admitted 01/02/22 with HA, periorbital edema, and SBP>250. Elevated troponin; IV heparin started; 1/21 move to ICU for IV Cleviprix. PMHx: head/ neck CA status post laryngectomy, CKD stage IV, HTN, chronic  back pain, COPD    PT Comments    On arrival pt standing at EOB with OT.  Emphasis on progressing gait toward PLOF.  Work on challenge to balance with abrupt changes in speed, direction, visually scanning, backing up, straightening room after return, before sitting in the recliner.   Recommendations for follow up therapy are one component of a multi-disciplinary discharge planning process, led by the attending physician.  Recommendations may be updated based on patient status, additional functional criteria and insurance authorization.  Follow Up Recommendations  No PT follow up     Assistance Recommended at Discharge Intermittent Supervision/Assistance  Patient can return home with the following Assistance with cooking/housework;Assist for transportation   Equipment Recommendations  None recommended by PT    Recommendations for Other Services       Precautions / Restrictions Precautions Precautions: Fall Restrictions Weight Bearing Restrictions: No     Mobility  Bed Mobility Overal bed mobility: Independent Bed Mobility: Supine to Sit       Sit to supine: Independent        Transfers Overall transfer level: Independent Equipment used: None Transfers: Sit to/from Stand Sit to Stand: Independent                Ambulation/Gait Ambulation/Gait assistance: Independent (in room and homelike environment) Gait Distance (Feet): 300 Feet   Gait Pattern/deviations: Step-through pattern, Decreased stride length   Gait velocity interpretation: 1.31 - 2.62 ft/sec, indicative of limited  community ambulator   General Gait Details: Overall steady, able to decrease and increase speed significantly, abruptly turn, back up and scan without any significant deviation and no LOB.   Stairs Stairs:  (deferred by patient)           Wheelchair Mobility    Modified Rankin (Stroke Patients Only)       Balance Overall balance assessment: No apparent balance deficits (not formally assessed)                                          Cognition Arousal/Alertness: Awake/alert Behavior During Therapy: WFL for tasks assessed/performed Overall Cognitive Status: Within Functional Limits for tasks assessed                                          Exercises      General Comments General comments (skin integrity, edema, etc.): off of tele monitor      Pertinent Vitals/Pain Pain Assessment Pain Assessment: No/denies pain    Home Living                          Prior Function            PT Goals (current goals can now be found in the care plan section) Acute Rehab PT Goals PT Goal Formulation: With patient Time For Goal Achievement: 01/18/22 Potential to Achieve Goals: Good Progress towards PT goals: Progressing toward goals  Frequency    Min 3X/week      PT Plan Current plan remains appropriate    Co-evaluation              AM-PAC PT "6 Clicks" Mobility   Outcome Measure  Help needed turning from your back to your side while in a flat bed without using bedrails?: None Help needed moving from lying on your back to sitting on the side of a flat bed without using bedrails?: None Help needed moving to and from a bed to a chair (including a wheelchair)?: None Help needed standing up from a chair using your arms (e.g., wheelchair or bedside chair)?: None Help needed to walk in hospital room?: None Help needed climbing 3-5 steps with a railing? : A Little 6 Click Score: 23    End of Session   Activity  Tolerance: Patient tolerated treatment well Patient left: in chair;with call bell/phone within reach Nurse Communication: Mobility status PT Visit Diagnosis: Other abnormalities of gait and mobility (R26.89);Muscle weakness (generalized) (M62.81)     Time: 4403-4742 PT Time Calculation (min) (ACUTE ONLY): 9 min  Charges:  $Gait Training: 8-22 mins                     01/07/2022  [name]., PT Acute Rehabilitation Services [phone]  (pager) [phone]  (office)   [name] [name] 01/07/2022, 12:05 PM

## 2022-01-07 NOTE — Progress Notes (Signed)
PROGRESS NOTE    [name]  TGG:[id number] DOB: 1946-10-11 DOA: 01/01/2022 PCP: Hoyt Koch, MD   Brief Narrative:  76 year old female with extensive medical history including but not limited to head and neck cancer s/p total laryngectomy, stage IV CKD, HTN, HLD, PAD, prior stroke, COPD and depression who presented to the ED 01/02/2022 for headache and periorbital edema.  Family reported that her blood pressure was extremely elevated prior to ED arrival.  Initially admitted to [facility] for HTN management.  On 01/03/2022, patient began to vomit with rebound hypertension and given labile blood pressures, patient was transferred to ICU under CCM care.  Blood pressures improved after initiation of Cleviprex drip and oral antihypertensive med management.  Care transferred to [facility] on 1/23 but blood pressures continue to remain high.  Cardiology assisting with med management.  Assessment & Plan:   Hypertensive emergency with concurrently elevated troponin due to demand ischemia, POA Cards following - suspect demand ischemia in the setting of hypertensive emergency/malignant hypertension. 2D echo shows normal LVEF. Briefly on IV heparin drip, since discontinued and transitioned to eliquis given afib below Left renal artery stenosis, less than 60%, possibly contributing but per discussion with cardiology unlikely requires intervention Upon discharge, outpatient follow-up with hypertension clinic with Dr. Oval Linsey.  CKD stage 4 secondary to hypertension (HCC) Acute kidney injury complicating stage IV chronic kidney disease Baseline creatinine approximately 2.5. Creatinine generally trending upwards -if continues to trend upwards will discuss with nephrology prior to discharge for further intervention although likely secondary to hypertensive emergency as above Avoid nephrotoxic's, NSAIDs discontinued 1/23. Trend daily BMP.   History of head and neck cancer-s/p laryngectomy-with  tracheostomy Chronic issue-stable for outpatient follow-up with ENT/oncology.   S/P laryngectomy Chronic.   COPD (chronic obstructive pulmonary disease) with chronic bronchitis (HCC) Stable-continue with bronchodilators.   PAF (paroxysmal atrial fibrillation)-CHADS2 Vas score of 5 Cardiology on board. CHA2DS2-VASc score: 5 Briefly on IV heparin, now on reduced dose Eliquis 2.5 Mg twice daily due to renal insufficiency TSH normal Remains in sinus rhythm on telemetry..   Hypothyroidism Continue levothyroxine.   GAD (generalized anxiety disorder) Depression: Resume lorazepam at half of home dose-continue duloxetine.    History of DVT (deep vein thrombosis) Continue reduced dose Eliquis.   Anemia in CKD (chronic kidney disease) Stable  DVT prophylaxis: Eliquis -started 01/03/22 Code Status: Full Family Communication: None present  Status is: Inpatient  Dispo: The patient is from: Home              Anticipated d/c is to: Home              Anticipated d/c date is: 24 to 48 hours              Patient currently not medically stable for discharge  Consultants:  Cardiology  Procedures:  None  Antimicrobials:  None  Subjective: No acute issues or events overnight, denies nausea vomiting diarrhea constipation headache fevers chills or chest pain  Objective: Vitals:   01/07/22 0000 01/07/22 0200 01/07/22 0600 01/07/22 0631  BP: (!) 140/49 121/70 (!) 148/61 (!) 148/61  Pulse: 81 (!) 196 87   Resp: 16 (!) 21 19   Temp:      TempSrc:      SpO2: 97% (!) 88% 91%   Weight:      Height:        Intake/Output Summary (Last 24 hours) at 01/07/2022 0739 Last data filed at 01/06/2022 1400 Gross per 24 hour  Intake 100  ml  Output --  Net 100 ml   Filed Weights   01/03/22 2000 01/04/22 0500 01/05/22 0451  Weight: 55.6 kg 55.5 kg 55 kg    Examination:  General:  Pleasantly resting in bed, No acute distress. HEENT:  Normocephalic atraumatic.  Sclerae nonicteric,  noninjected.  Extraocular movements intact bilaterally. Neck:  Without mass or deformity.  Trachea is midline.  Tracheostomy site clean dry intact Lungs:  Clear to auscultate bilaterally without rhonchi, wheeze, or rales. Heart:  Regular rate and rhythm.  Without murmurs, rubs, or gallops. Abdomen:  Soft, nontender, nondistended.  Without guarding or rebound. Extremities: Without cyanosis, clubbing, edema, or obvious deformity. Vascular:  Dorsalis pedis and posterior tibial pulses palpable bilaterally.     Data Reviewed: I have personally reviewed following labs and imaging studies  CBC: Recent Labs  Lab 01/02/22 0034 01/02/22 0811 01/03/22 0036 01/04/22 0228 01/05/22 0147  WBC 10.6* 9.3 9.5 13.8* 11.2*  NEUTROABS 7.4 6.1  --   --   --   HGB 10.5* 9.7* 9.9* 10.7* 10.7*  HCT 32.1* 29.9* 30.6* 31.9* 32.5*  MCV 87.5 89.0 85.5 84.4 86.9  PLT 221 180 170 250 956   Basic Metabolic Panel: Recent Labs  Lab 01/02/22 0811 01/03/22 0740 01/04/22 0228 01/05/22 0147 01/06/22 0219 01/07/22 0116  NA 139 138 138 139 135 135  K 4.4 4.0 3.9 3.9 3.9 3.8  CL 109 106 105 107 105 103  CO2 19* 21* 19* 21* 18* 20*  GLUCOSE 110* 142* 142* 108* 138* 112*  BUN 61* 54* 47* 62* 58* 63*  CREATININE 2.67* 2.68* 2.62* 3.44* 3.16* 3.45*  CALCIUM 8.9 9.5 9.4 9.1 8.7* 9.1  MG 1.7  --   --  2.0  --   --   PHOS  --   --   --  7.3*  --   --    GFR: Estimated Creatinine Clearance: 12 mL/min (A) (by C-G formula based on SCr of 3.45 mg/dL (H)). Liver Function Tests: Recent Labs  Lab 01/02/22 0034 01/02/22 0811  AST 34 19  ALT 14 13  ALKPHOS 104 88  BILITOT 0.9 0.2*  PROT 5.9* 5.3*  ALBUMIN 3.1* 2.8*   No results for input(s): LIPASE, AMYLASE in the last 168 hours. No results for input(s): AMMONIA in the last 168 hours. Coagulation Profile: No results for input(s): INR, PROTIME in the last 168 hours. Cardiac Enzymes: No results for input(s): CKTOTAL, CKMB, CKMBINDEX, TROPONINI in the last  168 hours. BNP (last 3 results) No results for input(s): PROBNP in the last 8760 hours. HbA1C: No results for input(s): HGBA1C in the last 72 hours. CBG: Recent Labs  Lab 01/03/22 2001  GLUCAP 156*   Lipid Profile: No results for input(s): CHOL, HDL, LDLCALC, TRIG, CHOLHDL, LDLDIRECT in the last 72 hours. Thyroid Function Tests: No results for input(s): TSH, T4TOTAL, FREET4, T3FREE, THYROIDAB in the last 72 hours. Anemia Panel: No results for input(s): VITAMINB12, FOLATE, FERRITIN, TIBC, IRON, RETICCTPCT in the last 72 hours. Sepsis Labs: No results for input(s): PROCALCITON, LATICACIDVEN in the last 168 hours.  Recent Results (from the past 240 hour(s))  Resp Panel by RT-PCR (Flu A&B, Covid) Nasopharyngeal Swab     Status: None   Collection Time: 01/02/22 12:32 AM   Specimen: Nasopharyngeal Swab; Nasopharyngeal(NP) swabs in vial transport medium  Result Value Ref Range Status   SARS Coronavirus 2 by RT PCR NEGATIVE NEGATIVE Final    Comment: (NOTE) SARS-CoV-2 target nucleic acids are NOT DETECTED.  The SARS-CoV-2 RNA is generally detectable in upper respiratory specimens during the acute phase of infection. The lowest concentration of SARS-CoV-2 viral copies this assay can detect is 138 copies/mL. A negative result does not preclude SARS-Cov-2 infection and should not be used as the sole basis for treatment or other patient management decisions. A negative result may occur with  improper specimen collection/handling, submission of specimen other than nasopharyngeal swab, presence of viral mutation(s) within the areas targeted by this assay, and inadequate number of viral copies(<138 copies/mL). A negative result must be combined with clinical observations, patient history, and epidemiological information. The expected result is Negative.  Fact Sheet for Patients:  EntrepreneurPulse.com.au  Fact Sheet for Healthcare Providers:   IncredibleEmployment.be  This test is no t yet approved or cleared by the [place] FDA and  has been authorized for detection and/or diagnosis of SARS-CoV-2 by FDA under an Emergency Use Authorization (EUA). This EUA will remain  in effect (meaning this test can be used) for the duration of the COVID-19 declaration under Section 564(b)(1) of the Act, 21 U.S.C.section 360bbb-3(b)(1), unless the authorization is terminated  or revoked sooner.       Influenza A by PCR NEGATIVE NEGATIVE Final   Influenza B by PCR NEGATIVE NEGATIVE Final    Comment: (NOTE) The Xpert Xpress SARS-CoV-2/FLU/RSV plus assay is intended as an aid in the diagnosis of influenza from Nasopharyngeal swab specimens and should not be used as a sole basis for treatment. Nasal washings and aspirates are unacceptable for Xpert Xpress SARS-CoV-2/FLU/RSV testing.  Fact Sheet for Patients: EntrepreneurPulse.com.au  Fact Sheet for Healthcare Providers: IncredibleEmployment.be  This test is not yet approved or cleared by the [place] FDA and has been authorized for detection and/or diagnosis of SARS-CoV-2 by FDA under an Emergency Use Authorization (EUA). This EUA will remain in effect (meaning this test can be used) for the duration of the COVID-19 declaration under Section 564(b)(1) of the Act, 21 U.S.C. section 360bbb-3(b)(1), unless the authorization is terminated or revoked.  Performed at [facility], [place] [address], [place], [place] [address]   MRSA Next Gen by PCR, Nasal     Status: None   Collection Time: 01/02/22  2:51 PM   Specimen: Nasal Mucosa; Nasal Swab  Result Value Ref Range Status   MRSA by PCR Next Gen NOT DETECTED NOT DETECTED Final    Comment: (NOTE) The GeneXpert MRSA Assay (FDA approved for NASAL specimens only), is one component of a comprehensive MRSA colonization surveillance program. It is not intended to diagnose  MRSA infection nor to guide or monitor treatment for MRSA infections. Test performance is not FDA approved in patients less than 56 years old. Performed at [facility], [place] [address] [place], [place] [address]          Radiology Studies: VAS US RENAL ARTERY DUPLEX  Result Date: 01/06/2022 ABDOMINAL VISCERAL Patient Name:  [name]  Date of Exam:   01/06/2022 Medical Rec #: [id number]         Accession #:    [id number] Date of Birth: 31-May-1946        Patient Gender: F Patient Age:   65 years Exam Location:  [facility] Procedure:      VAS US RENAL ARTERY DUPLEX Referring Phys: [id number] ANAND D HONGALGI -------------------------------------------------------------------------------- Indications: Malignant hypertension Limitations: Air/bowel gas. Comparison Study: No prior study Performing Technologist: Maudry Mayhew MHA, RDMS, RVT, RDCS  Examination Guidelines: A complete evaluation includes B-mode  imaging, spectral Doppler, color Doppler, and power Doppler as needed of all accessible portions of each vessel. Bilateral testing is considered an integral part of a complete examination. Limited examinations for reoccurring indications may be performed as noted.  Duplex Findings: +--------------------+--------+--------+------+--------------------+  Mesenteric           PSV cm/s EDV cm/s Plaque       Comments        +--------------------+--------+--------+------+--------------------+  Aorta Prox              78       16                1.25cm AP        +--------------------+--------+--------+------+--------------------+  Aorta Mid                                     2.44cm AP, 2.8cm trv  +--------------------+--------+--------+------+--------------------+  Celiac Artery Origin   171                                          +--------------------+--------+--------+------+--------------------+  SMA Proximal           217       31                                  +--------------------+--------+--------+------+--------------------+    +------------------+--------+--------+------------------+  Right Renal Artery PSV cm/s EDV cm/s      Comment        +------------------+--------+--------+------------------+  Origin                               Unable to insonate  +------------------+--------+--------+------------------+  Proximal              60       20                        +------------------+--------+--------+------------------+  Mid                   71       19                        +------------------+--------+--------+------------------+  Distal                39       9                         +------------------+--------+--------+------------------+ +-----------------+--------+--------+------------------+  Left Renal Artery PSV cm/s EDV cm/s      Comment        +-----------------+--------+--------+------------------+  Origin                              Unable to insonate  +-----------------+--------+--------+------------------+  Proximal            160       14                        +-----------------+--------+--------+------------------+  Mid  77       10                        +-----------------+--------+--------+------------------+  Distal               42       7                         +-----------------+--------+--------+------------------+ +------------+--------+--------+----+-----------+--------+--------+----+  Right Kidney PSV cm/s EDV cm/s RI   Left Kidney PSV cm/s EDV cm/s RI    +------------+--------+--------+----+-----------+--------+--------+----+  Upper Pole   14       4        0.69 Upper Pole  29       6        0.79  +------------+--------+--------+----+-----------+--------+--------+----+  Mid          15       3        0.81 Mid         30       5        0.82  +------------+--------+--------+----+-----------+--------+--------+----+  Lower Pole   12       4        0.64 Lower Pole  20       7        0.65   +------------+--------+--------+----+-----------+--------+--------+----+  Hilar        20       4        0.78 Hilar       42       6        0.85  +------------+--------+--------+----+-----------+--------+--------+----+ +------------------+---------+------------------+---------+  Right Kidney                 Left Kidney                   +------------------+---------+------------------+---------+  RAR                          RAR                           +------------------+---------+------------------+---------+  RAR (manual)       0.91      RAR (manual)       2.05       +------------------+---------+------------------+---------+  Cortex             12/3 cm/s Cortex             17/7 cm/s  +------------------+---------+------------------+---------+  Cortex thickness             Corex thickness               +------------------+---------+------------------+---------+  Kidney length (cm) 7.43      Kidney length (cm) 10.10      +------------------+---------+------------------+---------+  Summary: Renal:  Right: Cyst(s) noted. RRV flow present. No evidence of right renal        artery stenosis. Left:  Cyst(s) noted. LRV flow present. 1-59% stenosis of the left        renal artery. Abnormal left Resisitve Index.  *See table(s) above for measurements and observations.  Diagnosing physician: Deitra Mayo MD  Electronically signed by Deitra Mayo MD on 01/06/2022 at 2:21:36 PM.    Final         Scheduled Meds:  amLODipine  10 mg Oral Daily  apixaban  2.5 mg Oral BID   carvedilol  25 mg Oral BID WC   Chlorhexidine Gluconate Cloth  6 each Topical Daily   cilostazol  100 mg Oral BID   docusate sodium  100 mg Oral Daily   DULoxetine  20 mg Oral Daily   feeding supplement  237 mL Oral Daily   hydrALAZINE  100 mg Oral Q8H   isosorbide mononitrate  60 mg Oral Daily   levothyroxine  50 mcg Oral QAC breakfast   LORazepam  0.5 mg Oral BID   mirtazapine  15 mg Oral QHS   polyethylene glycol  17 g Oral  Daily   rosuvastatin  20 mg Oral Daily   Continuous Infusions:  cefTRIAXone (ROCEPHIN)  IV 1 g (01/07/22 0346)     LOS: 4 days   Time spent: 11min  Larisa Lanius C Lisanne Ponce, DO [facility]  If 7PM-7AM, please contact night-coverage [email or web address]  01/07/2022, 7:39 AM

## 2022-01-07 NOTE — Progress Notes (Signed)
Progress Note  Patient Name: [name] Date of Encounter: 01/07/2022  [facility] Cardiologist: Mertie Moores, MD   Subjective   She looks much more comfortable today. No issues Renal duplex showed mild L renal artery stenosis  Inpatient Medications    Scheduled Meds:  amLODipine  10 mg Oral Daily   apixaban  2.5 mg Oral BID   carvedilol  25 mg Oral BID WC   Chlorhexidine Gluconate Cloth  6 each Topical Daily   cilostazol  100 mg Oral BID   docusate sodium  100 mg Oral Daily   DULoxetine  20 mg Oral Daily   feeding supplement  237 mL Oral Daily   hydrALAZINE  100 mg Oral Q8H   isosorbide mononitrate  60 mg Oral Daily   levothyroxine  50 mcg Oral QAC breakfast   LORazepam  0.5 mg Oral BID   mirtazapine  15 mg Oral QHS   polyethylene glycol  17 g Oral Daily   rosuvastatin  20 mg Oral Daily   Continuous Infusions:   PRN Meds: acetaminophen **OR** acetaminophen, labetalol, ondansetron **OR** ondansetron (ZOFRAN) IV, oxyCODONE   Vital Signs    Vitals:   01/07/22 0200 01/07/22 0600 01/07/22 0631 01/07/22 0740  BP: 121/70 (!) 148/61 (!) 148/61   Pulse: (!) 196 87    Resp: (!) 21 19    Temp:    98.7 F (37.1 C)  TempSrc:    Oral  SpO2: (!) 88% 91%    Weight:      Height:        Intake/Output Summary (Last 24 hours) at 01/07/2022 0938 Last data filed at 01/06/2022 1400 Gross per 24 hour  Intake 100 ml  Output --  Net 100 ml   Last 3 Weights 01/05/2022 01/04/2022 01/03/2022  Weight (lbs) 121 lb 4.1 oz 122 lb 5.7 oz 122 lb 9.2 oz  Weight (kg) 55 kg 55.5 kg 55.6 kg      Telemetry    Sinus rhythm - Personally Reviewed  ECG    No new - Personally Reviewed  Physical Exam   Vitals:   01/07/22 0631 01/07/22 0740  BP: (!) 148/61   Pulse:    Resp:    Temp:  98.7 F (37.1 C)  SpO2:      GEN: No acute distress, chronically ill appearing  ; thin and frail Neck: No JVD, Tracheostomy, No bruits  Cardiac: RRR, no murmurs, rubs, or gallops.   Respiratory: Clear to auscultation bilaterally. GI: Soft, nontender, non-distended  MS: No edema; No deformity. Neuro:  Nonfocal  Psych: Normal affect   Labs    High Sensitivity Troponin:   Recent Labs  Lab 01/02/22 0034 01/02/22 0235  TROPONINIHS 1,457* 1,357*     Chemistry Recent Labs  Lab 01/02/22 0034 01/02/22 1610 01/03/22 0740 01/05/22 0147 01/06/22 0219 01/07/22 0116  NA 140 139   < > 139 135 135  K 5.0 4.4   < > 3.9 3.9 3.8  CL 106 109   < > 107 105 103  CO2 23 19*   < > 21* 18* 20*  GLUCOSE 116* 110*   < > 108* 138* 112*  BUN 66* 61*   < > 62* 58* 63*  CREATININE 2.70* 2.67*   < > 3.44* 3.16* 3.45*  CALCIUM 9.1 8.9   < > 9.1 8.7* 9.1  MG  --  1.7  --  2.0  --   --   PROT 5.9* 5.3*  --   --   --   --  ALBUMIN 3.1* 2.8*  --   --   --   --   AST 34 19  --   --   --   --   ALT 14 13  --   --   --   --   ALKPHOS 104 88  --   --   --   --   BILITOT 0.9 0.2*  --   --   --   --   GFRNONAA 18* 18*   < > 13* 15* 13*  ANIONGAP 11 11   < > 11 12 12    < > = values in this interval not displayed.    Lipids No results for input(s): CHOL, TRIG, HDL, LABVLDL, LDLCALC, CHOLHDL in the last 168 hours.  Hematology Recent Labs  Lab 01/03/22 0036 01/04/22 0228 01/05/22 0147  WBC 9.5 13.8* 11.2*  RBC 3.58* 3.78* 3.74*  HGB 9.9* 10.7* 10.7*  HCT 30.6* 31.9* 32.5*  MCV 85.5 84.4 86.9  MCH 27.7 28.3 28.6  MCHC 32.4 33.5 32.9  RDW 13.7 13.9 14.0  PLT 170 250 205   Thyroid  Recent Labs  Lab 01/02/22 1528  TSH 3.283    BNP Recent Labs  Lab 01/02/22 0034  BNP 49.3    DDimer No results for input(s): DDIMER in the last 168 hours.   Radiology    VAS US RENAL ARTERY DUPLEX  Result Date: 01/06/2022 ABDOMINAL VISCERAL Patient Name:  [name]  Date of Exam:   01/06/2022 Medical Rec #: [id number]         Accession #:    [id number] Date of Birth: February 11, 1946        Patient Gender: F Patient Age:   19 years Exam Location:  [facility] Procedure:      VAS  US RENAL ARTERY DUPLEX Referring Phys: [id number] ANAND D HONGALGI -------------------------------------------------------------------------------- Indications: Malignant hypertension Limitations: Air/bowel gas. Comparison Study: No prior study Performing Technologist: Maudry Mayhew MHA, RDMS, RVT, RDCS  Examination Guidelines: A complete evaluation includes B-mode imaging, spectral Doppler, color Doppler, and power Doppler as needed of all accessible portions of each vessel. Bilateral testing is considered an integral part of a complete examination. Limited examinations for reoccurring indications may be performed as noted.  Duplex Findings: +--------------------+--------+--------+------+--------------------+  Mesenteric           PSV cm/s EDV cm/s Plaque       Comments        +--------------------+--------+--------+------+--------------------+  Aorta Prox              78       16                1.25cm AP        +--------------------+--------+--------+------+--------------------+  Aorta Mid                                     2.44cm AP, 2.8cm trv  +--------------------+--------+--------+------+--------------------+  Celiac Artery Origin   171                                          +--------------------+--------+--------+------+--------------------+  SMA Proximal           217       31                                 +--------------------+--------+--------+------+--------------------+    +------------------+--------+--------+------------------+  Right Renal Artery PSV cm/s EDV cm/s      Comment        +------------------+--------+--------+------------------+  Origin                               Unable to insonate  +------------------+--------+--------+------------------+  Proximal              60       20                        +------------------+--------+--------+------------------+  Mid                   71       19                        +------------------+--------+--------+------------------+  Distal                 39       9                         +------------------+--------+--------+------------------+ +-----------------+--------+--------+------------------+  Left Renal Artery PSV cm/s EDV cm/s      Comment        +-----------------+--------+--------+------------------+  Origin                              Unable to insonate  +-----------------+--------+--------+------------------+  Proximal            160       14                        +-----------------+--------+--------+------------------+  Mid                  77       10                        +-----------------+--------+--------+------------------+  Distal               42       7                         +-----------------+--------+--------+------------------+ +------------+--------+--------+----+-----------+--------+--------+----+  Right Kidney PSV cm/s EDV cm/s RI   Left Kidney PSV cm/s EDV cm/s RI    +------------+--------+--------+----+-----------+--------+--------+----+  Upper Pole   14       4        0.69 Upper Pole  29       6        0.79  +------------+--------+--------+----+-----------+--------+--------+----+  Mid          15       3        0.81 Mid         30       5        0.82  +------------+--------+--------+----+-----------+--------+--------+----+  Lower Pole   12       4        0.64 Lower Pole  20       7        0.65  +------------+--------+--------+----+-----------+--------+--------+----+  Hilar        20       4        0.78 Hilar  42       6        0.85  +------------+--------+--------+----+-----------+--------+--------+----+ +------------------+---------+------------------+---------+  Right Kidney                 Left Kidney                   +------------------+---------+------------------+---------+  RAR                          RAR                           +------------------+---------+------------------+---------+  RAR (manual)       0.91      RAR (manual)       2.05       +------------------+---------+------------------+---------+   Cortex             12/3 cm/s Cortex             17/7 cm/s  +------------------+---------+------------------+---------+  Cortex thickness             Corex thickness               +------------------+---------+------------------+---------+  Kidney length (cm) 7.43      Kidney length (cm) 10.10      +------------------+---------+------------------+---------+  Summary: Renal:  Right: Cyst(s) noted. RRV flow present. No evidence of right renal        artery stenosis. Left:  Cyst(s) noted. LRV flow present. 1-59% stenosis of the left        renal artery. Abnormal left Resisitve Index.  *See table(s) above for measurements and observations.  Diagnosing physician: Deitra Mayo MD  Electronically signed by Deitra Mayo MD on 01/06/2022 at 2:21:36 PM.    Final     Cardiac Studies   Echo  01/02/22    1. Left ventricular ejection fraction, by estimation, is 60 to 65%. The  left ventricle has normal function. The left ventricle has no regional  wall motion abnormalities. There is mild concentric left ventricular  hypertrophy. Left ventricular diastolic  parameters are consistent with Grade I diastolic dysfunction (impaired  relaxation).   2. Right ventricular systolic function is normal. The right ventricular  size is normal. Tricuspid regurgitation signal is inadequate for assessing  PA pressure.   3. Left atrial size was moderately dilated.   4. The mitral valve is grossly normal. No evidence of mitral valve  regurgitation.   5. The aortic valve is grossly normal. Aortic valve regurgitation is not  visualized.   Comparison(s): No significant change from prior study.     Patient Profile     76 y.o. female with a hx of poorly controlled HTN, HLD, PAF (no oac?), PAD, CKD4 (bl sCr ~2.5), Laryngeal SCC s/p Laryngectomy and chemo/XRT, hypothyroidism, prior DVT, COPD, prior tobacco abuse, GERD and MDD who is being seen 01/02/2022 for the evaluation of hypertensive emergency at the request of Dr.  Betsey Holiday.  Assessment & Plan    Hypertensive emergency:  Hx of poorly controlled HTN .  Presented with markedly elevated BP and troponin elevation 1457. - Patient required Cleviprex drip and ICU admission overnight on 1/21-1/22 after marked hypertension after vomiting.  SBP fell to 130s, gtt discontinued on 1/22.   - BP more stable - renal duplex shows 1-59% stenosis of the left renal artery, can be contributing but unlikely needs intervention  -continue carvedilol 25 mg BID, amlodipine 10 mg daily. Continue  hydralazine to 100 mg TID and imdur 60 mg daily - Patient had headaches, CT head on 1/21 showed no acute intracranial process . No focal deficits. Continue tylenol  - will plan for outpatient FU with hypertension clinic with Dr. Jeanann Lewandowsky NP    PAF: Remains in sinus rhythm per telemetry, EKG - CHA2DS2-VASc score 5 (hypertension, age x2, vascular disease, female) - Continue Eliquis 2.5 mg BID, reduced dose given weight and renal function - TSH normal this admission    CKD  Stage IV: GFR ~18: contributing to her labile blood pressures   Hx PAD - Continue statin    Laryngeal Ca, s/p laryngectomy and chemo/XRT in 2013     Hx DVT - Anticoagulation with eliquis    Hx COPD - stable    Patient is stable from a cardiac standpoint for discharge. No further medication changes recommended at this time. She has an appointment 01/16/2022 with the hypertension clinic at [place] with Dr. Blenda Mounts team  For questions or updates, please contact [facility] Please consult [email or web address] for contact info under        Signed, Janina Mayo, MD  01/07/2022, 9:38 AM

## 2022-01-07 NOTE — Progress Notes (Signed)
Occupational Therapy Treatment/Discharge Patient Details Name: [name] MRN: [id number] DOB: 1946-09-17 Today's Date: 01/07/2022   History of present illness 76 year old Hispanic female admitted 01/02/22 with HA, periorbital edema, and SBP>250. Elevated troponin; IV heparin started; 1/21 move to ICU for IV Cleviprix. PMHx: head/ neck CA status post laryngectomy, CKD stage IV, HTN, chronic  back pain, COPD   OT comments  Pt reports feeling back to baseline aside from mild headache. Pt reports going to bathroom independently and feeling more steady on her feet. Pt able to demo LB dressing, picking up items from floor and folding linens independently and without safety concerns. Pt politely declined UE HEP education for strengthening at this time. No further skilled OT services needed at acute level with all OT goals met. OT to sign off.    Recommendations for follow up therapy are one component of a multi-disciplinary discharge planning process, led by the attending physician.  Recommendations may be updated based on patient status, additional functional criteria and insurance authorization.    Follow Up Recommendations  No OT follow up    Assistance Recommended at Discharge None  Patient can return home with the following      Equipment Recommendations  None recommended by OT    Recommendations for Other Services      Precautions / Restrictions Precautions Precautions: Fall Restrictions Weight Bearing Restrictions: No       Mobility Bed Mobility Overal bed mobility: Independent Bed Mobility: Supine to Sit       Sit to supine: Independent        Transfers Overall transfer level: Independent Equipment used: None Transfers: Sit to/from Stand Sit to Stand: Independent                 Balance Overall balance assessment: No apparent balance deficits (not formally assessed)                                         ADL either performed or  assessed with clinical judgement   ADL Overall ADL's : Independent                     Lower Body Dressing: Independent;Sit to/from stand   Toilet Transfer: Independent           Functional mobility during ADLs: Independent General ADL Comments: Independent with LB dressing, reports going to bathroom without assist. Pt able to demo ability to pick up ADL items from floor without LOB, stand to fold linens, etc    Extremity/Trunk Assessment Upper Extremity Assessment Upper Extremity Assessment: Overall WFL for tasks assessed   Lower Extremity Assessment Lower Extremity Assessment: Defer to PT evaluation        Vision   Vision Assessment?: No apparent visual deficits   Perception     Praxis      Cognition Arousal/Alertness: Awake/alert Behavior During Therapy: WFL for tasks assessed/performed Overall Cognitive Status: Within Functional Limits for tasks assessed                                          Exercises      Shoulder Instructions       General Comments off of tele monitor    Pertinent Vitals/ Pain       Pain Assessment Pain  Assessment: No/denies pain  Home Living                                          Prior Functioning/Environment              Frequency  Min 2X/week        Progress Toward Goals  OT Goals(current goals can now be found in the care plan section)  Progress towards OT goals: Goals met and updated - see care plan  Acute Rehab OT Goals Patient Stated Goal: go home today OT Goal Formulation: All assessment and education complete, DC therapy Time For Goal Achievement: 01/18/22 Potential to Achieve Goals: Good ADL Goals Pt Will Perform Grooming: with modified independence;standing Pt Will Perform Upper Body Bathing: with modified independence;sitting Pt Will Perform Lower Body Bathing: with modified independence;sit to/from stand Pt Will Perform Upper Body Dressing: with  modified independence;sitting Pt Will Perform Lower Body Dressing: with modified independence;sit to/from stand Pt Will Transfer to Toilet: with modified independence;ambulating;regular height toilet Pt Will Perform Toileting - Clothing Manipulation and hygiene: with modified independence;sit to/from stand  Plan All goals met and education completed, patient discharged from OT services    Co-evaluation                 AM-PAC OT "6 Clicks" Daily Activity     Outcome Measure   Help from another person eating meals?: None Help from another person taking care of personal grooming?: None Help from another person toileting, which includes using toliet, bedpan, or urinal?: None Help from another person bathing (including washing, rinsing, drying)?: None Help from another person to put on and taking off regular upper body clothing?: None Help from another person to put on and taking off regular lower body clothing?: None 6 Click Score: 24    End of Session    OT Visit Diagnosis: Unsteadiness on feet (R26.81)   Activity Tolerance Patient tolerated treatment well   Patient Left Other (comment) (to walk with PT in hallway)   Nurse Communication Mobility status        Time: 1117-1130 OT Time Calculation (min): 13 min  Charges: OT General Charges $OT Visit: 1 Visit OT Treatments $Self Care/Home Management : 8-22 mins  Malachy Chamber, OTR/L Acute Rehab Services Office: [phone]   Layla Maw 01/07/2022, 11:43 AM

## 2022-01-07 NOTE — Plan of Care (Signed)

## 2022-01-08 DIAGNOSIS — I161 Hypertensive emergency: Secondary | ICD-10-CM | POA: Diagnosis not present

## 2022-01-08 LAB — BASIC METABOLIC PANEL
Anion gap: 14 (ref 5–15)
BUN: 62 mg/dL — ABNORMAL HIGH (ref 8–23)
CO2: 20 mmol/L — ABNORMAL LOW (ref 22–32)
Calcium: 9.3 mg/dL (ref 8.9–10.3)
Chloride: 102 mmol/L (ref 98–111)
Creatinine, Ser: 3.43 mg/dL — ABNORMAL HIGH (ref 0.44–1.00)
GFR, Estimated: 13 mL/min — ABNORMAL LOW (ref >60)
Glucose, Bld: 115 mg/dL — ABNORMAL HIGH (ref 70–99)
Potassium: 3.9 mmol/L (ref 3.5–5.1)
Sodium: 136 mmol/L (ref 135–145)

## 2022-01-08 MED ORDER — AMLODIPINE BESYLATE 10 MG PO TABS: 10.0000 mg | ORAL_TABLET | Freq: Every day | ORAL | 0 refills | Status: DC

## 2022-01-08 MED ORDER — LORAZEPAM 0.5 MG PO TABS: 0.5000 mg | ORAL_TABLET | Freq: Two times a day (BID) | ORAL | 0 refills | Status: DC

## 2022-01-08 MED ORDER — DOCUSATE SODIUM 100 MG PO CAPS: 100.0000 mg | ORAL_CAPSULE | Freq: Every day | ORAL | 0 refills | Status: DC

## 2022-01-08 MED ORDER — APIXABAN 2.5 MG PO TABS: 2.5000 mg | ORAL_TABLET | Freq: Two times a day (BID) | ORAL | 0 refills | Status: DC

## 2022-01-08 MED ORDER — ISOSORBIDE MONONITRATE ER 60 MG PO TB24: 60.0000 mg | ORAL_TABLET | Freq: Every day | ORAL | 0 refills | Status: DC

## 2022-01-08 MED ORDER — HYDRALAZINE HCL 100 MG PO TABS: 100.0000 mg | ORAL_TABLET | Freq: Three times a day (TID) | ORAL | 0 refills | Status: DC

## 2022-01-08 MED ORDER — CARVEDILOL 25 MG PO TABS: 25.0000 mg | ORAL_TABLET | Freq: Two times a day (BID) | ORAL | 0 refills | Status: DC

## 2022-01-08 NOTE — Plan of Care (Signed)
°  Problem: Education: Goal: Knowledge of General Education information will improve Description: Including pain rating scale, medication(s)/side effects and non-pharmacologic comfort measures 01/08/2022 1159 by Rocky Morel, RN Outcome: Adequate for Discharge 01/08/2022 0918 by Rocky Morel, RN Outcome: Progressing   Problem: Health Behavior/Discharge Planning: Goal: Ability to manage health-related needs will improve 01/08/2022 1159 by Rocky Morel, RN Outcome: Adequate for Discharge 01/08/2022 0918 by Rocky Morel, RN Outcome: Progressing   Problem: Clinical Measurements: Goal: Ability to maintain clinical measurements within normal limits will improve 01/08/2022 1159 by Rocky Morel, RN Outcome: Adequate for Discharge 01/08/2022 0918 by Rocky Morel, RN Outcome: Progressing Goal: Will remain free from infection 01/08/2022 1159 by Rocky Morel, RN Outcome: Adequate for Discharge 01/08/2022 0918 by Rocky Morel, RN Outcome: Progressing Goal: Diagnostic test results will improve 01/08/2022 1159 by Rocky Morel, RN Outcome: Adequate for Discharge 01/08/2022 7948 by Rocky Morel, RN Outcome: Progressing Goal: Respiratory complications will improve 01/08/2022 1159 by Rocky Morel, RN Outcome: Adequate for Discharge 01/08/2022 0165 by Rocky Morel, RN Outcome: Progressing Goal: Cardiovascular complication will be avoided 01/08/2022 1159 by Rocky Morel, RN Outcome: Adequate for Discharge 01/08/2022 0918 by Rocky Morel, RN Outcome: Progressing   Problem: Activity: Goal: Risk for activity intolerance will decrease 01/08/2022 1159 by Rocky Morel, RN Outcome: Adequate for Discharge 01/08/2022 0918 by Rocky Morel, RN Outcome: Progressing   Problem: Nutrition: Goal: Adequate nutrition will be maintained 01/08/2022 1159 by Rocky Morel, RN Outcome: Adequate for Discharge 01/08/2022 0918 by  Rocky Morel, RN Outcome: Progressing   Problem: Coping: Goal: Level of anxiety will decrease 01/08/2022 1159 by Rocky Morel, RN Outcome: Adequate for Discharge 01/08/2022 0918 by Rocky Morel, RN Outcome: Progressing   Problem: Elimination: Goal: Will not experience complications related to bowel motility 01/08/2022 1159 by Rocky Morel, RN Outcome: Adequate for Discharge 01/08/2022 0918 by Rocky Morel, RN Outcome: Progressing Goal: Will not experience complications related to urinary retention 01/08/2022 1159 by Rocky Morel, RN Outcome: Adequate for Discharge 01/08/2022 0918 by Rocky Morel, RN Outcome: Progressing   Problem: Pain Managment: Goal: General experience of comfort will improve 01/08/2022 1159 by Rocky Morel, RN Outcome: Adequate for Discharge 01/08/2022 0918 by Rocky Morel, RN Outcome: Progressing   Problem: Safety: Goal: Ability to remain free from injury will improve 01/08/2022 1159 by Rocky Morel, RN Outcome: Adequate for Discharge 01/08/2022 0918 by Rocky Morel, RN Outcome: Progressing   Problem: Skin Integrity: Goal: Risk for impaired skin integrity will decrease 01/08/2022 1159 by Rocky Morel, RN Outcome: Adequate for Discharge 01/08/2022 0918 by Rocky Morel, RN Outcome: Progressing

## 2022-01-08 NOTE — Discharge Summary (Signed)
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 01/03/22 0740 01/04/22 0228 01/05/22 0147 01/06/22 0219 01/07/22 0116 01/08/22 0125  NA 139   < > 138 139 135 135 136  K 4.4   < > 3.9 3.9 3.9 3.8 3.9  CL 109   < > 105 107 105 103 102  CO2 19*   < > 19* 21* 18* 20* 20*  GLUCOSE 110*   < > 142* 108* 138* 112* 115*  BUN 61*   < > 47* 62* 58* 63* 62*  CREATININE 2.67*   < > 2.62* 3.44* 3.16* 3.45* 3.43*  CALCIUM 8.9   < > 9.4 9.1 8.7* 9.1 9.3  MG 1.7  --   --  2.0  --   --   --   PHOS  --   --   --  7.3*  --   --   --    < > = values in this interval not displayed.    Liver Function Tests: Recent Labs  Lab 01/02/22 0034 01/02/22 0811  AST 34 19  ALT 14 13  ALKPHOS 104 88  BILITOT 0.9 0.2*  PROT 5.9* 5.3*  ALBUMIN 3.1* 2.8*    No results for input(s): LIPASE, AMYLASE in the last 168 hours. No results for input(s): AMMONIA in  the last 168 hours. CBC: Recent Labs  Lab 01/02/22 0034 01/02/22 0811 01/03/22 0036 01/04/22 0228 01/05/22 0147  WBC 10.6* 9.3 9.5 13.8* 11.2*  NEUTROABS 7.4 6.1  --   --   --   HGB 10.5* 9.7* 9.9* 10.7* 10.7*  HCT 32.1* 29.9* 30.6* 31.9* 32.5*  MCV 87.5 89.0 85.5 84.4 86.9  PLT 221 180 170 250 205    Cardiac Enzymes: No results for input(s): CKTOTAL, CKMB, CKMBINDEX, TROPONINI in the last 168 hours. BNP: Invalid input(s): POCBNP CBG: Recent Labs  Lab 01/03/22 2001  GLUCAP 156*    D-Dimer No results for input(s): DDIMER in the last 72 hours. Hgb A1c No results for input(s): HGBA1C in the last 72 hours. Lipid Profile No results for input(s): CHOL, HDL, LDLCALC, TRIG, CHOLHDL, LDLDIRECT in the last 72 hours. Thyroid function studies No results for input(s): TSH, T4TOTAL, T3FREE, THYROIDAB in the last 72 hours.  Invalid input(s): FREET3 Anemia work up No results for input(s): VITAMINB12, FOLATE, FERRITIN, TIBC, IRON, RETICCTPCT in the last 72 hours. Urinalysis    Component Value Date/Time   COLORURINE YELLOW 01/04/2022 2329   APPEARANCEUR CLOUDY (A) 01/04/2022 2329   LABSPEC 1.017 01/04/2022 2329   PHURINE 9.0 (H) 01/04/2022 2329   GLUCOSEU NEGATIVE 01/04/2022 2329   GLUCOSEU NEGATIVE 08/11/2021 0942   HGBUR NEGATIVE 01/04/2022 2329   BILIRUBINUR NEGATIVE 01/04/2022 2329   BILIRUBINUR Negative 01/19/2019 1510   KETONESUR NEGATIVE 01/04/2022 2329   PROTEINUR >=300 (A) 01/04/2022 2329   UROBILINOGEN 0.2 08/11/2021 0942   NITRITE NEGATIVE 01/04/2022 2329   LEUKOCYTESUR LARGE (A) 01/04/2022 2329   Sepsis Labs Invalid input(s): PROCALCITONIN,  WBC,  LACTICIDVEN Microbiology Recent Results (from the past 240 hour(s))  Resp Panel by RT-PCR (Flu A&B, Covid) Nasopharyngeal Swab     Status: None   Collection Time: 01/02/22 12:32 AM   Specimen: Nasopharyngeal Swab; Nasopharyngeal(NP) swabs in vial transport medium  Result Value Ref Range Status   SARS Coronavirus 2  by RT PCR NEGATIVE NEGATIVE Final    Comment: (NOTE) SARS-CoV-2 target nucleic acids are NOT DETECTED.  The SARS-CoV-2 RNA is generally detectable in upper respiratory specimens during the acute phase of  infection. The lowest concentration of SARS-CoV-2 viral copies this assay can detect is 138 copies/mL. A negative result does not preclude SARS-Cov-2 infection and should not be used as the sole basis for treatment or other patient management decisions. A negative result may occur with  improper specimen collection/handling, submission of specimen other than nasopharyngeal swab, presence of viral mutation(s) within the areas targeted by this assay, and inadequate number of viral copies(<138 copies/mL). A negative result must be combined with clinical observations, patient history, and epidemiological information. The expected result is Negative.  Fact Sheet for Patients:  EntrepreneurPulse.com.au  Fact Sheet for Healthcare Providers:  IncredibleEmployment.be  This test is no t yet approved or cleared by the [place] FDA and  has been authorized for detection and/or diagnosis of SARS-CoV-2 by FDA under an Emergency Use Authorization (EUA). This EUA will remain  in effect (meaning this test can be used) for the duration of the COVID-19 declaration under Section 564(b)(1) of the Act, 21 U.S.C.section 360bbb-3(b)(1), unless the authorization is terminated  or revoked sooner.       Influenza A by PCR NEGATIVE NEGATIVE Final   Influenza B by PCR NEGATIVE NEGATIVE Final    Comment: (NOTE) The Xpert Xpress SARS-CoV-2/FLU/RSV plus assay is intended as an aid in the diagnosis of influenza from Nasopharyngeal swab specimens and should not be used as a sole basis for treatment. Nasal washings and aspirates are unacceptable for Xpert Xpress SARS-CoV-2/FLU/RSV testing.  Fact Sheet for Patients: EntrepreneurPulse.com.au  Fact  Sheet for Healthcare Providers: IncredibleEmployment.be  This test is not yet approved or cleared by the [place] FDA and has been authorized for detection and/or diagnosis of SARS-CoV-2 by FDA under an Emergency Use Authorization (EUA). This EUA will remain in effect (meaning this test can be used) for the duration of the COVID-19 declaration under Section 564(b)(1) of the Act, 21 U.S.C. section 360bbb-3(b)(1), unless the authorization is terminated or revoked.  Performed at [facility], [place] [address] [place], [place] [address]   MRSA Next Gen by PCR, Nasal     Status: None   Collection Time: 01/02/22  2:51 PM   Specimen: Nasal Mucosa; Nasal Swab  Result Value Ref Range Status   MRSA by PCR Next Gen NOT DETECTED NOT DETECTED Final    Comment: (NOTE) The GeneXpert MRSA Assay (FDA approved for NASAL specimens only), is one component of a comprehensive MRSA colonization surveillance program. It is not intended to diagnose MRSA infection nor to guide or monitor treatment for MRSA infections. Test performance is not FDA approved in patients less than 27 years old. Performed at [facility], [place] [address], [place], [place] [address]      Time coordinating discharge: Over 30 minutes  SIGNED:   Little Ishikawa, DO [facility] 01/08/2022, 5:35 PM Pager   If 7PM-7AM, please contact night-coverage [email or web address]

## 2022-01-08 NOTE — Plan of Care (Signed)

## 2022-01-08 NOTE — Progress Notes (Signed)
Patient discharged via home w/ son. Discharge paperwork gone over with son and patient. Patient was transported to the [facility] with all belongings (phone, shoes, glasses, and clothing.) No distress was noted during discharge.

## 2022-01-09 ENCOUNTER — Telehealth: Payer: Self-pay

## 2022-01-09 NOTE — Telephone Encounter (Signed)
Transition Care Management Unsuccessful Follow-up Telephone Call  Date of discharge and from where:  [name] 01/08/2021  Attempts:  1st Attempt  Reason for unsuccessful TCM follow-up call:  Unable to leave message

## 2022-01-12 ENCOUNTER — Telehealth: Payer: Self-pay

## 2022-01-12 NOTE — Telephone Encounter (Addendum)
Transition Care Management Unsuccessful Follow-up Telephone Call  Date of discharge and from where:  [place] 01-08-22 Dx: hypertensive emergency  Attempts:  2nd Attempt  Reason for unsuccessful TCM follow-up call:  Left voice message  Transition Care Management Unsuccessful Follow-up Telephone Call  Date of discharge and from where:  [place] 01-08-22 Dx: hypertensive emergency  Attempts:  3rd Attempt  Reason for unsuccessful TCM follow-up call:  Left voice message

## 2022-01-14 NOTE — Progress Notes (Deleted)
Cardiology Office Note:    Date:  01/14/2022   ID:  [name], DOB 12-23-1945, MRN [id number]  PCP:  Hoyt Koch, MD   [facility] Providers Cardiologist:  Mertie Moores, MD { Click to update primary MD,subspecialty MD or APP then REFRESH:1}    Referring MD: Hoyt Koch, *   Follow-up for hypertension  History of Present Illness:    [name] is a 76 y.o. female with a hx of PAF, HTN, PAD, aortic atherosclerosis, demand ischemia, COPD, esophageal dysphagia, hypothyroidism, CKD stage IV, generalized anxiety disorder, prediabetes, status post laryngectomy, and history of DVT.  She was admitted to the hospital 01/02/2022 and discharged on 01/08/2022.  She presented to the emergency department with complaints of headache and periorbital edema.  Her family reported that her blood pressure had been extremely elevated prior to arrival to the emergency department.  On 01/03/2022 she began to vomit with rebound hypertension.  Due to her labile blood pressures she was transferred to the ICU for CCM care.  Her blood pressures improved after initiation of Cleviprex and oral antihypertensive medication.  Her care [facility] 01/05/20[facility].  Her blood pressure continued to remain high.  And cardiology was consulted.  She was discharged in stable condition on amlodipine, carvedilol, hydralazine, and Imdur.  Her echocardiogram showed normal LVEF.  She was instructed to follow-up with the advanced hypertension clinic.  Her blood pressure on 12/30/2021 was 124/70.  She presents to the clinic today for evaluation and states***  *** denies chest pain, shortness of breath, lower extremity edema, fatigue, palpitations, melena, hematuria, hemoptysis, diaphoresis, weakness, presyncope, syncope, orthopnea, and PND.  Current medication regimen Amlodipine 10 mg daily Carvedilol 25 mg twice daily Hydralazine 100 mg 3 times daily Isosorbide  mononitrate 60 mg daily    Past Medical History:  Diagnosis Date   Anemia    Anxiety    takes Ativan prn   Blood transfusion without reported diagnosis 09/15/12   2 units Prbc's   Broken ribs    Chronic back pain    CKD (chronic kidney disease), stage IV (HCC)    Constipation    related to pain meds   COPD (chronic obstructive pulmonary disease) ([place]) 08/10/2012   denies   Depression    Gastrostomy in place [facility])    removed   GERD (gastroesophageal reflux disease)    takes Zantac daily   Headache(784.0)    Hiatal hernia 08/10/2012   History of radiation therapy 10/17/12-11/25/12   supraglottic larynx,high risk neck tumor bed 5880 cGy/28 sessions, high risk lymph node tumor bed 5600 cGy/20 sessions, mod risk lymph node tumor bed 5040 cGy/20 sessions   Hypercholesteremia    takes Pravastatin daily   Hypertension    takes Tribenzor and Atenolol daily   Insomnia    takes Amitriptyline daily   Nausea    takes Zofran prn   PAD (peripheral artery disease) ([place])    noninvasive imaging in 2016   Pneumonia    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 no residual   Uterine cancer [facility])     Past Surgical History:  Procedure Laterality Date   ABDOMINAL SURGERY     r/t uterine carcinoma   APPENDECTOMY     DIRECT LARYNGOSCOPY N/A 05/22/2014   Procedure: DIRECT LARYNGOSCOPY WITH ESOPHAGEAL DILATION;  Surgeon: Jerrell Belfast, MD;  Location: [place];  Service: ENT;  Laterality: N/A;   ESOPHAGOSCOPY WITH DILITATION N/A 05/29/2015   Procedure: ESOPHAGOSCOPY WITH ESOPHAGEAL DILITATION;  Surgeon: Jerrell Belfast, MD;  Location: [place];  Service: ENT;  Laterality: N/A;   Gastrostomy Tube removed   2013   GASTROSTOMY W/ FEEDING TUBE  12   HERNIA REPAIR     child   LARYNGETOMY  08/31/2012   Procedure: LARYNGECTOMY;  Surgeon: Jerrell Belfast, MD;  Location: [place];  Service: ENT;  Laterality: N/A;   LARYNGOSCOPY   08/10/2012   Procedure: LARYNGOSCOPY;  Surgeon: Jerrell Belfast, MD;  Location: [facility];  Service: ENT;  Laterality: N/A;  with biopsy   RADICAL NECK DISSECTION  08/31/2012   Procedure: RADICAL NECK DISSECTION;  Surgeon: Jerrell Belfast, MD;  Location: [place];  Service: ENT;  Laterality: Bilateral;   TRACHEAL ESOPHOGEAL PUNCTURE WITH REPAIR STOMA N/A 09/08/2013   Procedure: TRACHEAL ESOPHOGEAL PUNCTURE WITH PLACEMENT OF  [place] ;  Surgeon: Jerrell Belfast, MD;  Location: [place];  Service: ENT;  Laterality: N/A;   TRACHEOSTOMY TUBE PLACEMENT  08/10/2012   Procedure: TRACHEOSTOMY;  Surgeon: Jerrell Belfast, MD;  Location: [facility];  Service: ENT;  Laterality: N/A;    Current Medications: No outpatient medications have been marked as taking for the 01/16/22 encounter (Appointment) with Deberah Pelton, NP.     Allergies:   Xyzal [levocetirizine dihydrochloride], Augmentin [amoxicillin-pot clavulanate], and Tribenzor [olmesartan-amlodipine-hctz]   Social History   Socioeconomic History   Marital status: Widowed    Spouse name: Not on file   Number of children: 4   Years of education: Not on file   Highest education level: Not on file  Occupational History    Comment: retired Regulatory affairs officer  Tobacco Use   Smoking status: Former    Packs/day: 0.25    Years: 50.00    Pack years: 12.50    Types: Cigarettes    Quit date: 05/27/2013    Years since quitting: 8.6   Smokeless tobacco: Never  Vaping Use   Vaping Use: Never used  Substance and Sexual Activity   Alcohol use: Yes    Comment: 1-2 shots per week   Drug use: No   Sexual activity: Not Currently  Other Topics Concern   Not on file  Social History Narrative   Not on file   Social Determinants of Health   Financial Resource Strain: Not on file  Food Insecurity: No Food Insecurity   Worried About [place] in the Last Year: Never true   [place] in the Last Year: Never true  Transportation Needs: No  Transportation Needs   Lack of Transportation (Medical): No   Lack of Transportation (Non-Medical): No  Physical Activity: Not on file  Stress: Not on file  Social Connections: Socially Isolated   Frequency of Communication with Friends and Family: Once a week   Frequency of Social Gatherings with Friends and Family: Once a week   Attends Religious Services: More than 4 times per year   Active Member of [employer] or Organizations: No   Attends Archivist Meetings: Never   Marital Status: Widowed     Family History: The patient's ***family history includes Brain cancer in her brother; Throat cancer in her father. There is no history of CAD.  ROS:   Please see the history of present illness.    *** All other systems reviewed and are negative.   Risk Assessment/Calculations:   {Does this patient have ATRIAL FIBRILLATION?[phone]}  Physical Exam:    VS:  There were no vitals taken for this visit.    Wt Readings from Last 3 Encounters:  01/08/22 122 lb 5.7 oz (55.5 kg)  07/18/21 120 lb (54.4 kg)  07/11/21 120 lb (54.4 kg)     GEN: *** Well nourished, well developed in no acute distress HEENT: Normal NECK: No JVD; No carotid bruits LYMPHATICS: No lymphadenopathy CARDIAC: ***RRR, no murmurs, rubs, gallops RESPIRATORY:  Clear to auscultation without rales, wheezing or rhonchi  ABDOMEN: Soft, non-tender, non-distended MUSCULOSKELETAL:  No edema; No deformity  SKIN: Warm and dry NEUROLOGIC:  Alert and oriented x 3 PSYCHIATRIC:  Normal affect    EKGs/Labs/Other Studies Reviewed:    The following studies were reviewed today:  Echocardiogram 01/02/2022 IMPRESSIONS     1. Left ventricular ejection fraction, by estimation, is 60 to 65%. The  left ventricle has normal function. The left ventricle has no regional  wall motion abnormalities. There is mild concentric left ventricular  hypertrophy. Left ventricular diastolic  parameters are consistent with Grade  I diastolic dysfunction (impaired  relaxation).   2. Right ventricular systolic function is normal. The right ventricular  size is normal. Tricuspid regurgitation signal is inadequate for assessing  PA pressure.   3. Left atrial size was moderately dilated.   4. The mitral valve is grossly normal. No evidence of mitral valve  regurgitation.   5. The aortic valve is grossly normal. Aortic valve regurgitation is not  visualized.   Comparison(s): No significant change from prior study.  EKG:  EKG is *** ordered today.  The ekg ordered today demonstrates ***  Recent Labs: 01/02/2022: ALT 13; B Natriuretic Peptide 49.3; TSH 3.283 01/05/2022: Hemoglobin 10.7; Magnesium 2.0; Platelets 205 01/08/2022: BUN 62; Creatinine, Ser 3.43; Potassium 3.9; Sodium 136  Recent Lipid Panel    Component Value Date/Time   CHOL 177 12/27/2020 0912   TRIG 346 (H) 12/27/2020 0912   HDL 35 (L) 12/27/2020 0912   CHOLHDL 5.1 12/27/2020 0912   VLDL 69 (H) 12/27/2020 0912   LDLCALC 73 12/27/2020 0912   LDLDIRECT 88.0 02/22/2020 1141    ASSESSMENT & PLAN    Essential hypertension-BP today***.  Well managed at home.  Recently admitted to the hospital with complaints of elevated home blood pressures and periorbital swelling.  Required Cleviprex gtt. renal artery duplex showed mild left renal artery stenosis.  Her cardiac troponins were also elevated at 1457.  She also underwent head CT for headaches which showed no acute intracranial process. Continue amlodipine, carvedilol, hydralazine, Imdur Heart healthy low-sodium diet-salty 6 given Increase physical activity as tolerated Secondary causes for hypertension reviewed. Maintain blood pressure log  Paroxysmal atrial fibrillation-heart rate today***.  Denies recent episodes of accelerated or irregular heartbeat.  CHA2DS2-VASc score 5.  Reports compliance with apixaban and denies bleeding issues.  TSH normal. Continue carvedilol, apixaban Avoid triggers  CKD  stage IV-GFR 13 on 01/08/2022.  Contributing to labile blood pressures. Follows with PCP  Peripheral arterial disease-denies claudication. Continue statin therapy Follows with PCP  Disposition: Follow-up with Dr. Acie Fredrickson in 3-4 months.  {Are you ordering a CV Procedure (e.g. stress test, cath, DCCV, TEE, etc)?   Press F2        :[id number]}    Medication Adjustments/Labs and Tests Ordered: Current medicines are reviewed at length with the patient today.  Concerns regarding medicines are outlined above.  No orders of the defined types were placed in this encounter.  No orders of the defined types were  placed in this encounter.   There are no Patient Instructions on file for this visit.   Signed, Deberah Pelton, NP  01/14/2022 10:43 AM      Notice: This dictation was prepared with Dragon dictation along with smaller phrase technology. Any transcriptional errors that result from this process are unintentional and may not be corrected upon review.  I spent***minutes examining this patient, reviewing medications, and using patient centered shared decision making involving her cardiac care.  Prior to her visit I spent greater than 20 minutes reviewing her past medical history,  medications, and prior cardiac tests.

## 2022-01-16 ENCOUNTER — Ambulatory Visit (HOSPITAL_BASED_OUTPATIENT_CLINIC_OR_DEPARTMENT_OTHER): Payer: Self-pay | Admitting: General Practice

## 2022-01-17 ENCOUNTER — Other Ambulatory Visit: Payer: Self-pay | Admitting: Internal Medicine

## 2022-01-22 ENCOUNTER — Other Ambulatory Visit: Payer: Self-pay

## 2022-01-22 ENCOUNTER — Emergency Department (HOSPITAL_COMMUNITY): Payer: Medicare Other

## 2022-01-22 ENCOUNTER — Emergency Department (HOSPITAL_COMMUNITY)
Admission: EM | Admit: 2022-01-22 | Discharge: 2022-01-22 | Disposition: A | Discharge status: Home / Self Care | Payer: Medicare Other | Attending: Emergency Medicine | Admitting: Emergency Medicine

## 2022-01-22 ENCOUNTER — Telehealth: Payer: Self-pay | Admitting: Internal Medicine

## 2022-01-22 ENCOUNTER — Encounter (HOSPITAL_COMMUNITY): Payer: Self-pay | Admitting: Radiology

## 2022-01-22 DIAGNOSIS — I10 Essential (primary) hypertension: Secondary | ICD-10-CM | POA: Diagnosis not present

## 2022-01-22 DIAGNOSIS — I700 Atherosclerosis of aorta: Secondary | ICD-10-CM | POA: Diagnosis not present

## 2022-01-22 DIAGNOSIS — Z8521 Personal history of malignant neoplasm of larynx: Secondary | ICD-10-CM | POA: Insufficient documentation

## 2022-01-22 DIAGNOSIS — R911 Solitary pulmonary nodule: Secondary | ICD-10-CM | POA: Diagnosis not present

## 2022-01-22 DIAGNOSIS — E039 Hypothyroidism, unspecified: Secondary | ICD-10-CM | POA: Diagnosis not present

## 2022-01-22 DIAGNOSIS — J9811 Atelectasis: Secondary | ICD-10-CM | POA: Diagnosis not present

## 2022-01-22 DIAGNOSIS — Z7901 Long term (current) use of anticoagulants: Secondary | ICD-10-CM | POA: Insufficient documentation

## 2022-01-22 DIAGNOSIS — Z79899 Other long term (current) drug therapy: Secondary | ICD-10-CM | POA: Diagnosis not present

## 2022-01-22 DIAGNOSIS — R220 Localized swelling, mass and lump, head: Secondary | ICD-10-CM | POA: Insufficient documentation

## 2022-01-22 DIAGNOSIS — R0602 Shortness of breath: Secondary | ICD-10-CM | POA: Diagnosis not present

## 2022-01-22 DIAGNOSIS — R918 Other nonspecific abnormal finding of lung field: Secondary | ICD-10-CM | POA: Diagnosis not present

## 2022-01-22 DIAGNOSIS — J811 Chronic pulmonary edema: Secondary | ICD-10-CM | POA: Diagnosis not present

## 2022-01-22 DIAGNOSIS — I517 Cardiomegaly: Secondary | ICD-10-CM | POA: Diagnosis not present

## 2022-01-22 LAB — CBC WITH DIFFERENTIAL/PLATELET
Abs Immature Granulocytes: 0.08 10*3/uL — ABNORMAL HIGH (ref 0.00–0.07)
Basophils Absolute: 0 10*3/uL (ref 0.0–0.1)
Basophils Relative: 0 %
Eosinophils Absolute: 0.2 10*3/uL (ref 0.0–0.5)
Eosinophils Relative: 2 %
HCT: 25.2 % — ABNORMAL LOW (ref 36.0–46.0)
Hemoglobin: 8.2 g/dL — ABNORMAL LOW (ref 12.0–15.0)
Immature Granulocytes: 1 %
Lymphocytes Relative: 15 %
Lymphs Abs: 1.1 10*3/uL (ref 0.7–4.0)
MCH: 28.2 pg (ref 26.0–34.0)
MCHC: 32.5 g/dL (ref 30.0–36.0)
MCV: 86.6 fL (ref 80.0–100.0)
Monocytes Absolute: 0.7 10*3/uL (ref 0.1–1.0)
Monocytes Relative: 9 %
Neutro Abs: 5.4 10*3/uL (ref 1.7–7.7)
Neutrophils Relative %: 73 %
Platelets: 169 10*3/uL (ref 150–400)
RBC: 2.91 MIL/uL — ABNORMAL LOW (ref 3.87–5.11)
RDW: 14.1 % (ref 11.5–15.5)
WBC: 7.4 10*3/uL (ref 4.0–10.5)
nRBC: 0 % (ref 0.0–0.2)

## 2022-01-22 LAB — BASIC METABOLIC PANEL
Anion gap: 9 (ref 5–15)
BUN: 33 mg/dL — ABNORMAL HIGH (ref 8–23)
CO2: 19 mmol/L — ABNORMAL LOW (ref 22–32)
Calcium: 8.8 mg/dL — ABNORMAL LOW (ref 8.9–10.3)
Chloride: 109 mmol/L (ref 98–111)
Creatinine, Ser: 2.68 mg/dL — ABNORMAL HIGH (ref 0.44–1.00)
GFR, Estimated: 18 mL/min — ABNORMAL LOW (ref >60)
Glucose, Bld: 91 mg/dL (ref 70–99)
Potassium: 3.8 mmol/L (ref 3.5–5.1)
Sodium: 137 mmol/L (ref 135–145)

## 2022-01-22 LAB — TSH: TSH: 5.648 u[IU]/mL — ABNORMAL HIGH (ref 0.350–4.500)

## 2022-01-22 LAB — BRAIN NATRIURETIC PEPTIDE: B Natriuretic Peptide: 169.3 pg/mL — ABNORMAL HIGH (ref 0.0–100.0)

## 2022-01-22 LAB — T4, FREE: Free T4: 0.92 ng/dL (ref 0.61–1.12)

## 2022-01-22 MED ORDER — DEXAMETHASONE SODIUM PHOSPHATE 10 MG/ML IJ SOLN
10.0000 mg | Freq: Once | INTRAMUSCULAR | Status: AC
  Administered 2022-01-22: 10 mg via INTRAVENOUS
  Filled 2022-01-22: qty 1

## 2022-01-22 MED ORDER — FAMOTIDINE IN NACL 20-0.9 MG/50ML-% IV SOLN
20.0000 mg | Freq: Once | INTRAVENOUS | Status: AC
  Administered 2022-01-22: 20 mg via INTRAVENOUS
  Filled 2022-01-22: qty 50

## 2022-01-22 MED ORDER — DIPHENHYDRAMINE HCL 50 MG/ML IJ SOLN
25.0000 mg | Freq: Once | INTRAMUSCULAR | Status: AC
  Administered 2022-01-22: 25 mg via INTRAVENOUS
  Filled 2022-01-22: qty 1

## 2022-01-22 NOTE — Telephone Encounter (Signed)
Caller states pts face has been swollen since 2-7 w/ no other symptoms, caller requesting appt for today, informed her there are no availabilities for today  Pt was admitted to the hospital on 1-19, pt was advised to f/u w/ cardiology, according to caller pt did not f/u  Pts next ov is 2-13, caller states she will take pt to the ER

## 2022-01-22 NOTE — Telephone Encounter (Signed)
Called [name]. LVM asking her to give our office a call. I dont see where the pt has been taken the ER

## 2022-01-22 NOTE — ED Provider Triage Note (Signed)
Emergency Medicine Provider Triage Evaluation Note  [name] , a 76 y.o. female  was evaluated in triage.  Pt complains of allergic reaction onset 2 hours ago.  Patient denies any history of allergic reactions, use of epinephrine in the past.  There is a language barrier present, the patient's son translates.  Patient son states the patient was recently in the hospital, discharged due to facial swelling.  Patient is also experiencing facial swelling, she has periorbital edema bilaterally underneath each eye.  Patient also reporting tongue swelling.  Patient has stoma in place due to throat cancer.  Review of Systems  Positive: Tongue swelling, facial swelling, shortness of breath Negative: Nausea, vomiting, chest pain  Physical Exam  BP (!) 170/65 (BP Location: Right Arm)    Pulse 92    Temp 98.5 F (36.9 C) (Oral)    Resp 18    SpO2 97%  Gen:   Awake, no distress   Resp:  Normal effort  MSK:   Moves extremities without difficulty  Other:  Patient has periorbital swelling bilaterally to each eye.  She has swollen tongue on exam.  Patient lung sounds are clear.  Vital signs reassuring.  Medical Decision Making  Medically screening exam initiated at 1:37 PM.  Appropriate orders placed.  [name] was informed that the remainder of the evaluation will be completed by another provider, this initial triage assessment does not replace that evaluation, and the importance of remaining in the ED until their evaluation is complete.     Azucena Cecil, PA-C 01/22/22 1338

## 2022-01-22 NOTE — ED Provider Notes (Signed)
[facility] DEPT Provider Note   CSN: [id number] Arrival date & time: 01/22/22  1318     History  Chief Complaint  Patient presents with   Allergic Reaction    [name] is a 76 y.o. female.  76 yo F -with chief complaint facial swelling.  This has been an off-and-on issue for her for the past 3 months or so.  Seems to come and go.  She was recently in the hospital and had some facial swelling at that time she had significant hypertension.  Family felt that when her blood pressure was improved her facial swelling went down and she was discharged home.  She has a history of laryngeal cancer, status posttreatment by about 10 years or so.  History of hypothyroidism.  The history is provided by the patient.  Allergic Reaction Illness Severity:  Moderate Onset quality:  Gradual Duration:  2 days Timing:  Constant Progression:  Worsening Chronicity:  New     Home Medications Prior to Admission medications   Medication Sig Start Date End Date Taking? Authorizing Provider  amLODipine (NORVASC) 10 MG tablet Take 10 mg by mouth daily. 01/08/22   [provider]  Baclofen 5 MG TABS Take 1 tablet by mouth 2 (two) times daily as needed. 12/30/21   [provider]  benzonatate (TESSALON) 100 MG capsule SMARTSIG:1-2 Capsule(s) By Mouth 1-2 Times Daily PRN 12/23/21   [provider]  carvedilol (COREG) 25 MG tablet Take 25 mg by mouth 2 (two) times daily. 01/08/22   [provider]  cilostazol (PLETAL) 100 MG tablet Take 100 mg by mouth 2 (two) times daily. 01/03/22   [provider]  docusate sodium (COLACE) 100 MG capsule Take 100 mg by mouth daily. 01/08/22   [provider]  DULoxetine (CYMBALTA) 20 MG capsule Take 20 mg by mouth daily. 10/26/21   [provider]  ELIQUIS 2.5 MG TABS tablet Take 2.5 mg by mouth 2 (two) times daily. 01/08/22   [provider]  fluticasone (FLONASE) 50  MCG/ACT nasal spray Place 2 sprays into both nostrils daily. 10/13/21   [provider]  hydrALAZINE (APRESOLINE) 100 MG tablet Take 100 mg by mouth every 8 (eight) hours. 01/08/22   [provider]  HYDROcodone-acetaminophen (HYCET) 7.5-325 mg/15 ml solution Take 5 mLs by mouth every 6 (six) hours as needed. 10/30/21   [provider]  isosorbide mononitrate (IMDUR) 60 MG 24 hr tablet Take 60 mg by mouth daily. 11/13/21   [provider]  levothyroxine (SYNTHROID) 50 MCG tablet Take 50 mcg by mouth every morning. 11/13/21   [provider]  LORazepam (ATIVAN) 1 MG tablet Take 1 mg by mouth 2 (two) times daily. 12/29/21   [provider]  metoprolol tartrate (LOPRESSOR) 50 MG tablet Take 50 mg by mouth 2 (two) times daily. 11/13/21   [provider]  ondansetron (ZOFRAN) 4 MG tablet Take 4 mg by mouth every 8 (eight) hours as needed. 12/30/21   [provider]  predniSONE (DELTASONE) 20 MG tablet Take 40 mg by mouth every morning. 11/26/21   [provider]  rosuvastatin (CRESTOR) 20 MG tablet Take 20 mg by mouth daily. 01/03/22   [provider]      Allergies    Patient has no allergy information on record.    Review of Systems   Review of Systems  Physical Exam Updated Vital Signs BP (!) 150/59    Pulse 81  Temp 98.5 F (36.9 C) (Oral)    Resp 14    SpO2 94%  Physical Exam Vitals and nursing note reviewed.  Constitutional:      General: She is not in acute distress.    Appearance: She is well-developed. She is not diaphoretic.  HENT:     Head: Normocephalic and atraumatic.     Comments: Edema without erythema to the face and right upper extremity.    Mouth/Throat:     Comments: Trach in place Eyes:     Pupils: Pupils are equal, round, and reactive to light.  Cardiovascular:     Rate and Rhythm: Normal rate and regular rhythm.     Heart sounds: No murmur heard.   No friction rub. No gallop.   Pulmonary:     Effort: Pulmonary effort is normal.     Breath sounds: No wheezing or rales.  Abdominal:     General: There is no distension.     Palpations: Abdomen is soft.     Tenderness: There is no abdominal tenderness.  Musculoskeletal:        General: No tenderness.     Cervical back: Normal range of motion and neck supple.  Skin:    General: Skin is warm and dry.  Neurological:     Mental Status: She is alert and oriented to person, place, and time.  Psychiatric:        Behavior: Behavior normal.    ED Results / Procedures / Treatments   Labs (all labs ordered are listed, but only abnormal results are displayed) Labs Reviewed  CBC WITH DIFFERENTIAL/PLATELET - Abnormal; Notable for the following components:      Result Value   RBC 2.91 (*)    Hemoglobin 8.2 (*)    HCT 25.2 (*)    Abs Immature Granulocytes 0.08 (*)    All other components within normal limits  BASIC METABOLIC PANEL - Abnormal; Notable for the following components:   CO2 19 (*)    BUN 33 (*)    Creatinine, Ser 2.68 (*)    Calcium 8.8 (*)    GFR, Estimated 18 (*)    All other components within normal limits  BRAIN NATRIURETIC PEPTIDE - Abnormal; Notable for the following components:   B Natriuretic Peptide 169.3 (*)    All other components within normal limits  TSH - Abnormal; Notable for the following components:   TSH 5.648 (*)    All other components within normal limits  T4, FREE    EKG None  Radiology CT Chest Wo Contrast  Result Date: 01/22/2022 CLINICAL DATA:  Facial swelling EXAM: CT CHEST WITHOUT CONTRAST TECHNIQUE: Multidetector CT imaging of the chest was performed following the standard protocol without IV contrast. RADIATION DOSE REDUCTION: This exam was performed according to the departmental dose-optimization program which includes automated exposure control, adjustment of the mA and/or kV according to patient size and/or use of iterative reconstruction technique. COMPARISON:  CT  chest, abdomen and pelvis dated July 04, 2021 FINDINGS: Cardiovascular: Normal heart size. Trace pericardial fluid. Left main and three-vessel coronary artery calcifications. Atherosclerotic disease of the thoracic aorta. SVC is normal in caliber with no evidence of external compression. Mediastinum/Nodes: Small hiatal hernia. Patulous esophagus. Prominent subcentimeter mediastinal lymph nodes are slightly increased in size when compared to prior exam. Reference right lower paratracheal lymph node measuring 8 mm in short axis on image 47, previously 5 mm. Reference pre-vascular lymph node measuring 9 mm in short axis on image 52, previously  5 mm. Lungs/Pleura: Tracheostomy tube and place with debris noted in the trachea. Biapical consolidations with associated traction bronchiectasis, likely post radiation change. Mild bibasilar atelectasis. No consolidation, pleural effusion or pneumothorax. Stable solid pulmonary nodules of the right middle lobe measuring 4 mm on series 5, image 80 and 3 mm on image 88. Upper Abdomen: Partially visualized low-attenuation renal lesions, better evaluated on prior MRI abdomen dated January 16, 2021. No acute abnormality. Musculoskeletal: No chest wall mass or suspicious bone lesions identified. IMPRESSION: 1. SVC is normal in caliber with no evidence of external compression. Cannot evaluate for patency due to lack of IV contrast. 2. Tracheostomy tube in place with small amount of debris in the trachea. 3. Patulous esophagus, which can be seen the setting of esophageal dysmotility. 4. Prominent subcentimeter mediastinal lymph nodes are slightly increased in size when compared to prior exam, likely reactive. 5. Small solid pulmonary nodules of the right middle lobe, unchanged when compared with July 04, 2021 prior exam. 6.  Aortic Atherosclerosis (ICD10-I70.0). Electronically Signed   By: Yetta Glassman M.D.   On: 01/22/2022 15:21   DG Chest Port 1 View  Result Date:  01/22/2022 CLINICAL DATA:  facial swelling EXAM: PORTABLE CHEST 1 VIEW COMPARISON:  01/02/2022. FINDINGS: Streaky left basilar opacities with tenting of the left hemidiaphragm. No consolidation. No visible pleural effusions or pneumothorax. Enlarged cardiac silhouette. Pulmonary vascular congestion. IMPRESSION: 1. Streaky left basilar opacities with tenting of the left hemidiaphragm, probably atelectasis/scar. 2. Cardiomegaly and pulmonary vascular congestion. Electronically Signed   By: Margaretha Sheffield M.D.   On: 01/22/2022 14:56    Procedures Procedures    Medications Ordered in ED Medications  diphenhydrAMINE (BENADRYL) injection 25 mg (25 mg Intravenous Given 01/22/22 1422)  dexamethasone (DECADRON) injection 10 mg (10 mg Intravenous Given 01/22/22 1420)  famotidine (PEPCID) IVPB 20 mg premix (20 mg Intravenous New Bag/Given 01/22/22 1426)    ED Course/ Medical Decision Making/ A&P                           Medical Decision Making Amount and/or Complexity of Data Reviewed Labs: ordered. Radiology: ordered.   76 yo F with a significant past medical history of laryngeal cancer that status post treatments about 10 years ago.  Comes in with a chief complaint of facial and right upper extremity edema.  Has been off and on for months per the family.  If never noticed the swelling to the arm before.  She had been in the hospital recently with blood pressures in the 161W systolic.  She spent some time in the ICU had improvement of her facial swelling and her blood pressure and was sent home.  Since then she has been having recurrent facial swelling.  No similar issues with her blood pressure at home.  Family was concerned that maybe she is having allergic reaction.  There is not itchy.  No new products applied to the face.  My exam is more concerning for possible SVC syndrome.  Unfortunately the patient has very poor renal function at baseline.  I discussed imaging with Dr. Earleen Newport, radiology  recommended a CT scan of the chest without contrast.  This did not show any obvious obstructive pathology.  At this point the patient is feeling a little bit better after dose of Decadron.  I feel the risk of the work-up could likely be performed as an outpatient with her symptoms off and on for 3 months.  I  have a follow-up with her family doctor on Monday.  Blood work otherwise without significant change from baseline.  Her hemoglobin is down a couple grams but no obvious bleeding per the patient and family.  BNP in the indiscriminate range.  TSH mildly elevated not high enough that I think she would be in myxedema coma.  4:31 PM:  I have discussed the diagnosis/risks/treatment options with the patient and family.  Evaluation and diagnostic testing in the emergency department does not suggest an emergent condition requiring admission or immediate intervention beyond what has been performed at this time.  They will follow up with  PCP. We also discussed returning to the ED immediately if new or worsening sx occur. We discussed the sx which are most concerning (e.g., sudden worsening pain, fever, inability to tolerate by mouth) that necessitate immediate return. Medications administered to the patient during their visit and any new prescriptions provided to the patient are listed below.  Medications given during this visit Medications  diphenhydrAMINE (BENADRYL) injection 25 mg (25 mg Intravenous Given 01/22/22 1422)  dexamethasone (DECADRON) injection 10 mg (10 mg Intravenous Given 01/22/22 1420)  famotidine (PEPCID) IVPB 20 mg premix (20 mg Intravenous New Bag/Given 01/22/22 1426)     The patient appears reasonably screen and/or stabilized for discharge and I doubt any other medical condition or other [facility] requiring further screening, evaluation, or treatment in the ED at this time prior to discharge.          Final Clinical Impression(s) / ED Diagnoses Final diagnoses:  Facial swelling    Rx /  DC Orders ED Discharge Orders     None         Deno Etienne, DO 01/22/22 1632

## 2022-01-22 NOTE — Discharge Instructions (Signed)
Follow up with your doctor on Monday.  Return for worsening symptoms.

## 2022-01-22 NOTE — ED Triage Notes (Addendum)
Patient reports facial swelling and tongue swelling which started yesterday upon waking up. She reports becoming SOB approx 2 hours ago. She denies any known allergies and is unsure what is causing the reaction. Hx throat cancer.

## 2022-01-23 ENCOUNTER — Encounter: Payer: Self-pay | Admitting: Internal Medicine

## 2022-01-23 NOTE — Telephone Encounter (Signed)
As long as this is not progressive can keep visit for Monday.

## 2022-01-23 NOTE — Telephone Encounter (Signed)
She did go for evaluation yesterday please follow up with how they are doing. Keep visit for Monday

## 2022-01-23 NOTE — Telephone Encounter (Signed)
Spoke with Janett Billow and she stated that the pt's face still looks the same, the benadryl that was given in the hospital didn't really help. She is concerned that one of her medications may be causing this reaction.

## 2022-01-26 ENCOUNTER — Ambulatory Visit (INDEPENDENT_AMBULATORY_CARE_PROVIDER_SITE_OTHER): Payer: Medicare Other | Admitting: Internal Medicine

## 2022-01-26 ENCOUNTER — Encounter: Payer: Self-pay | Admitting: Internal Medicine

## 2022-01-26 ENCOUNTER — Other Ambulatory Visit: Payer: Self-pay

## 2022-01-26 VITALS — BP 128/60 | HR 92 | Resp 18 | Ht 64.0 in | Wt 135.0 lb

## 2022-01-26 DIAGNOSIS — M542 Cervicalgia: Secondary | ICD-10-CM

## 2022-01-26 DIAGNOSIS — R220 Localized swelling, mass and lump, head: Secondary | ICD-10-CM | POA: Diagnosis not present

## 2022-01-26 DIAGNOSIS — M79605 Pain in left leg: Secondary | ICD-10-CM | POA: Diagnosis not present

## 2022-01-26 DIAGNOSIS — I129 Hypertensive chronic kidney disease with stage 1 through stage 4 chronic kidney disease, or unspecified chronic kidney disease: Secondary | ICD-10-CM | POA: Diagnosis not present

## 2022-01-26 DIAGNOSIS — I10 Essential (primary) hypertension: Secondary | ICD-10-CM

## 2022-01-26 DIAGNOSIS — R1314 Dysphagia, pharyngoesophageal phase: Secondary | ICD-10-CM

## 2022-01-26 DIAGNOSIS — N184 Chronic kidney disease, stage 4 (severe): Secondary | ICD-10-CM

## 2022-01-26 DIAGNOSIS — J441 Chronic obstructive pulmonary disease with (acute) exacerbation: Secondary | ICD-10-CM

## 2022-01-26 MED ORDER — FUROSEMIDE 20 MG PO TABS: 20.0000 mg | ORAL_TABLET | Freq: Every day | ORAL | 3 refills | Status: DC | PRN

## 2022-01-26 MED ORDER — CARVEDILOL 25 MG PO TABS: 25.0000 mg | ORAL_TABLET | Freq: Two times a day (BID) | ORAL | 3 refills | Status: DC

## 2022-01-26 MED ORDER — METHYLPREDNISOLONE ACETATE 40 MG/ML IJ SUSP
40.0000 mg | Freq: Once | INTRAMUSCULAR | Status: AC
  Administered 2022-01-26: 40 mg via INTRAMUSCULAR

## 2022-01-26 MED ORDER — DOXYCYCLINE HYCLATE 100 MG PO TABS: 100.0000 mg | ORAL_TABLET | Freq: Two times a day (BID) | ORAL | 0 refills | Status: DC

## 2022-01-26 NOTE — Patient Instructions (Addendum)
We will prescribe lasix which is a fluid pill to use to help with the swelling in the face and the legs.  We have sent in doxycycline to take 1 pill twice a day for 1 week. It is okay to take 1 pill daily instead.

## 2022-01-26 NOTE — Progress Notes (Signed)
° °  Subjective:   Patient ID: [name], female    DOB: 11-Apr-1946, 76 y.o.   MRN: [id number]  HPI The patient is a 76 YO female coming in for concerns about facial swelling and itching. Got steroids at ER and itching is down some. Swelling may be a little better but still present. Seems like she is having some swallowing problems. Some arthritis pain as well.   Review of Systems  Constitutional:  Positive for activity change.  HENT: Negative.         Facial swelling  Eyes: Negative.   Respiratory:  Positive for cough and shortness of breath. Negative for chest tightness.   Cardiovascular:  Negative for chest pain, palpitations and leg swelling.  Gastrointestinal:  Negative for abdominal distention, abdominal pain, constipation, diarrhea, nausea and vomiting.  Musculoskeletal:  Positive for arthralgias, back pain and myalgias.  Skin: Negative.   Neurological: Negative.   Psychiatric/Behavioral: Negative.     Objective:  Physical Exam Constitutional:      Appearance: She is well-developed. She is ill-appearing.  HENT:     Head: Normocephalic and atraumatic.     Comments: Peri-orbital swelling no swelling around the mouth, lips, tongue Cardiovascular:     Rate and Rhythm: Normal rate and regular rhythm.  Pulmonary:     Effort: Pulmonary effort is normal. No respiratory distress.     Breath sounds: Rhonchi present. No wheezing or rales.     Comments: Stable lung exam, trach in place without signs of infection Abdominal:     General: Bowel sounds are normal. There is no distension.     Palpations: Abdomen is soft.     Tenderness: There is no abdominal tenderness. There is no rebound.  Musculoskeletal:        General: Tenderness present.     Cervical back: Normal range of motion.  Skin:    General: Skin is warm and dry.  Neurological:     Mental Status: She is alert and oriented to person, place, and time.     Coordination: Coordination abnormal.    Vitals:   01/26/22  1510  BP: 128/60  Pulse: 92  Resp: 18  SpO2: 98%  Weight: 135 lb (61.2 kg)  Height: 5\' 4"  (1.626 m)    This visit occurred during the SARS-CoV-2 public health emergency.  Safety protocols were in place, including screening questions prior to the visit, additional usage of staff PPE, and extensive cleaning of exam room while observing appropriate contact time as indicated for disinfecting solutions.   Assessment & Plan:  Depo-medrol 40 mg IM given at visit  Visit time 25 minutes in face to face communication with patient and coordination of care, additional 20 minutes spent in record review, coordination or care, ordering tests, communicating/referring to other healthcare professionals, documenting in medical records all on the same day of the visit for total time 45 minutes spent on the visit.

## 2022-01-28 DIAGNOSIS — Z9002 Acquired absence of larynx: Secondary | ICD-10-CM | POA: Diagnosis not present

## 2022-01-28 DIAGNOSIS — Z9221 Personal history of antineoplastic chemotherapy: Secondary | ICD-10-CM | POA: Diagnosis not present

## 2022-01-28 DIAGNOSIS — Z8521 Personal history of malignant neoplasm of larynx: Secondary | ICD-10-CM | POA: Diagnosis not present

## 2022-01-28 DIAGNOSIS — Z923 Personal history of irradiation: Secondary | ICD-10-CM | POA: Diagnosis not present

## 2022-01-28 DIAGNOSIS — R1314 Dysphagia, pharyngoesophageal phase: Secondary | ICD-10-CM | POA: Diagnosis not present

## 2022-01-28 DIAGNOSIS — R1310 Dysphagia, unspecified: Secondary | ICD-10-CM | POA: Diagnosis not present

## 2022-01-28 DIAGNOSIS — J398 Other specified diseases of upper respiratory tract: Secondary | ICD-10-CM | POA: Diagnosis not present

## 2022-01-30 ENCOUNTER — Encounter: Payer: Self-pay | Admitting: Internal Medicine

## 2022-01-30 DIAGNOSIS — R220 Localized swelling, mass and lump, head: Secondary | ICD-10-CM | POA: Insufficient documentation

## 2022-01-30 NOTE — Assessment & Plan Note (Signed)
Had CT chest to help rule out compression/SVC syndrome and there was no external compression. Given her CKD the decision was not to use dye due to risk of advancing her CKD. She does not have any arm swelling on exam. Swelling is more peri-orbital.

## 2022-01-30 NOTE — Assessment & Plan Note (Signed)
Breathing is stable today and using taking doxycycline currently and on steroids.

## 2022-01-30 NOTE — Assessment & Plan Note (Signed)
Given depo-medrol 40 mg IM today to help with the pain.

## 2022-01-30 NOTE — Assessment & Plan Note (Signed)
Esophagus with debris in recent CT scan which likely contributes to her chronic cough and drainage.

## 2022-01-30 NOTE — Assessment & Plan Note (Signed)
It does not seem likely that this is causing the facial swelling. Overall BMP stable at ER visit recently.

## 2022-01-30 NOTE — Assessment & Plan Note (Signed)
BP at goal on regimen changed during hospital. She has not started on coreg. Wants to resume atenolol as this helped palpitations more. Will reach out to cardiology as to this change. She is taking amlodipine 10 mg daily which could be causing some ankle swelling (likely not related to facial swelling). Rx lasix 20 mg daily to use for swelling as needed.

## 2022-01-31 ENCOUNTER — Other Ambulatory Visit: Payer: Self-pay | Admitting: Internal Medicine

## 2022-01-31 DIAGNOSIS — I739 Peripheral vascular disease, unspecified: Secondary | ICD-10-CM

## 2022-02-07 ENCOUNTER — Other Ambulatory Visit: Payer: Self-pay | Admitting: Hematology and Oncology

## 2022-02-09 ENCOUNTER — Telehealth: Payer: Self-pay | Admitting: Internal Medicine

## 2022-02-09 NOTE — Telephone Encounter (Signed)
1.Medication Requested: ELIQUIS 2.5 MG TABS tablet  2. Pharmacy (Name, Street, [place]): [id number] - [place], [address]  3. On Med List: yes  4. Last Visit with PCP: 01-26-2022  5. Next visit date with PCP: n/a  Provider did not prescribe medication, caller states pt is out of medication

## 2022-02-10 MED ORDER — APIXABAN 2.5 MG PO TABS: 2.5000 mg | ORAL_TABLET | Freq: Two times a day (BID) | ORAL | 3 refills | Status: DC

## 2022-02-10 NOTE — Telephone Encounter (Signed)
I don't see you as the prescriber, is it okay to fill?

## 2022-02-11 NOTE — Telephone Encounter (Signed)
Noted as refilled by primary MD  ?

## 2022-02-22 ENCOUNTER — Other Ambulatory Visit: Payer: Self-pay | Admitting: Internal Medicine

## 2022-03-02 ENCOUNTER — Telehealth: Payer: Self-pay | Admitting: Internal Medicine

## 2022-03-02 NOTE — Telephone Encounter (Signed)
1.Medication Requested: LORazepam (ATIVAN) 0.5 MG tablet ? ?hydrALAZINE (APRESOLINE) 100 MG tablet ? ? ?2. Pharmacy (Name, Street, [place]): [id number] ?3. On Med List: Y ? ?4. Last Visit with PCP: 01-26-2022 ? ?5. Next visit date with PCP: 03-05-2022 ? ?

## 2022-03-03 ENCOUNTER — Emergency Department (HOSPITAL_COMMUNITY)
Admission: EM | Admit: 2022-03-03 | Discharge: 2022-03-03 | Disposition: A | Discharge status: Home / Self Care | Payer: Medicare Other | Attending: Emergency Medicine | Admitting: Emergency Medicine

## 2022-03-03 ENCOUNTER — Other Ambulatory Visit: Payer: Self-pay

## 2022-03-03 ENCOUNTER — Emergency Department (HOSPITAL_COMMUNITY): Payer: Medicare Other

## 2022-03-03 ENCOUNTER — Encounter (HOSPITAL_COMMUNITY): Payer: Self-pay

## 2022-03-03 DIAGNOSIS — R062 Wheezing: Secondary | ICD-10-CM | POA: Insufficient documentation

## 2022-03-03 DIAGNOSIS — Z20822 Contact with and (suspected) exposure to covid-19: Secondary | ICD-10-CM | POA: Diagnosis not present

## 2022-03-03 DIAGNOSIS — R0600 Dyspnea, unspecified: Secondary | ICD-10-CM | POA: Diagnosis not present

## 2022-03-03 DIAGNOSIS — R0602 Shortness of breath: Secondary | ICD-10-CM | POA: Insufficient documentation

## 2022-03-03 DIAGNOSIS — Z79899 Other long term (current) drug therapy: Secondary | ICD-10-CM | POA: Diagnosis not present

## 2022-03-03 DIAGNOSIS — J189 Pneumonia, unspecified organism: Secondary | ICD-10-CM | POA: Diagnosis not present

## 2022-03-03 DIAGNOSIS — N189 Chronic kidney disease, unspecified: Secondary | ICD-10-CM | POA: Diagnosis not present

## 2022-03-03 DIAGNOSIS — J449 Chronic obstructive pulmonary disease, unspecified: Secondary | ICD-10-CM | POA: Insufficient documentation

## 2022-03-03 DIAGNOSIS — I129 Hypertensive chronic kidney disease with stage 1 through stage 4 chronic kidney disease, or unspecified chronic kidney disease: Secondary | ICD-10-CM | POA: Diagnosis not present

## 2022-03-03 DIAGNOSIS — I517 Cardiomegaly: Secondary | ICD-10-CM | POA: Diagnosis not present

## 2022-03-03 DIAGNOSIS — R059 Cough, unspecified: Secondary | ICD-10-CM | POA: Insufficient documentation

## 2022-03-03 DIAGNOSIS — Z7901 Long term (current) use of anticoagulants: Secondary | ICD-10-CM | POA: Insufficient documentation

## 2022-03-03 LAB — CBC WITH DIFFERENTIAL/PLATELET
Abs Immature Granulocytes: 0.16 10*3/uL — ABNORMAL HIGH (ref 0.00–0.07)
Basophils Absolute: 0 10*3/uL (ref 0.0–0.1)
Basophils Relative: 0 %
Eosinophils Absolute: 0.1 10*3/uL (ref 0.0–0.5)
Eosinophils Relative: 1 %
HCT: 25.4 % — ABNORMAL LOW (ref 36.0–46.0)
Hemoglobin: 8.4 g/dL — ABNORMAL LOW (ref 12.0–15.0)
Immature Granulocytes: 1 %
Lymphocytes Relative: 10 %
Lymphs Abs: 1.2 10*3/uL (ref 0.7–4.0)
MCH: 28.6 pg (ref 26.0–34.0)
MCHC: 33.1 g/dL (ref 30.0–36.0)
MCV: 86.4 fL (ref 80.0–100.0)
Monocytes Absolute: 0.7 10*3/uL (ref 0.1–1.0)
Monocytes Relative: 6 %
Neutro Abs: 9.7 10*3/uL — ABNORMAL HIGH (ref 1.7–7.7)
Neutrophils Relative %: 82 %
Platelets: 246 10*3/uL (ref 150–400)
RBC: 2.94 MIL/uL — ABNORMAL LOW (ref 3.87–5.11)
RDW: 13.5 % (ref 11.5–15.5)
WBC: 11.9 10*3/uL — ABNORMAL HIGH (ref 4.0–10.5)
nRBC: 0 % (ref 0.0–0.2)

## 2022-03-03 LAB — BRAIN NATRIURETIC PEPTIDE: B Natriuretic Peptide: 128.1 pg/mL — ABNORMAL HIGH (ref 0.0–100.0)

## 2022-03-03 LAB — BASIC METABOLIC PANEL
Anion gap: 11 (ref 5–15)
BUN: 45 mg/dL — ABNORMAL HIGH (ref 8–23)
CO2: 21 mmol/L — ABNORMAL LOW (ref 22–32)
Calcium: 8.9 mg/dL (ref 8.9–10.3)
Chloride: 103 mmol/L (ref 98–111)
Creatinine, Ser: 3.28 mg/dL — ABNORMAL HIGH (ref 0.44–1.00)
GFR, Estimated: 14 mL/min — ABNORMAL LOW (ref >60)
Glucose, Bld: 171 mg/dL — ABNORMAL HIGH (ref 70–99)
Potassium: 4.6 mmol/L (ref 3.5–5.1)
Sodium: 135 mmol/L (ref 135–145)

## 2022-03-03 LAB — RESP PANEL BY RT-PCR (FLU A&B, COVID) ARPGX2
Influenza A by PCR: NEGATIVE
Influenza B by PCR: NEGATIVE
SARS Coronavirus 2 by RT PCR: NEGATIVE

## 2022-03-03 MED ORDER — HYDRALAZINE HCL 100 MG PO TABS: 100.0000 mg | ORAL_TABLET | Freq: Three times a day (TID) | ORAL | 11 refills | Status: DC

## 2022-03-03 MED ORDER — DOXYCYCLINE HYCLATE 100 MG PO CAPS: 100.0000 mg | ORAL_CAPSULE | Freq: Two times a day (BID) | ORAL | 0 refills | Status: AC

## 2022-03-03 MED ORDER — CEFPODOXIME PROXETIL 200 MG PO TABS: 200.0000 mg | ORAL_TABLET | Freq: Two times a day (BID) | ORAL | 0 refills | Status: AC

## 2022-03-03 MED ORDER — LORAZEPAM 0.5 MG PO TABS: 0.5000 mg | ORAL_TABLET | Freq: Every day | ORAL | 0 refills | Status: DC | PRN

## 2022-03-03 NOTE — ED Notes (Signed)
Discharge instructions reviewed, questions answered. Rx education provided. Pt states understanding and no further questions. Pt wheeled out by family member upon discharge. No s/s of distress noted. BP noted to be elevated, but family states pt was supposed to take her BP meds about an hour ago. Pt instructed to take meds when they get home. ?

## 2022-03-03 NOTE — ED Provider Notes (Signed)
?[facility] DEPT ?Provider Note ? ? ?CSN: [id number] ?Arrival date & time: 03/03/22  1604 ? ?  ? ?History ? ?Chief Complaint  ?Patient presents with  ? Cough  ? Shortness of Breath  ? expiratory wheezing  ? Abdominal Pain  ? ? ?[name] is a 76 y.o. female. The patient presents to the emergency department accompanied by her son complaining of 4 days of shortness of breath with productive cough. She complains of producing a greenish brown sputum. The patient denies fever. Denies chest pain. Denies abdominal pain, denies N/V/D. Patient does have a tracheostomy tude. PMH significant for COPD, PAD, HTN, GERD, hx of radiation therapy, CKD, SCC of supraglottic area ? ?HPI ? ?  ? ?Home Medications ?Prior to Admission medications   ?Medication Sig Start Date End Date Taking? Authorizing Provider  ?amLODipine (NORVASC) 10 MG tablet Take 1 tablet (10 mg total) by mouth daily. 01/09/22   Little Ishikawa, MD  ?apixaban (ELIQUIS) 2.5 MG TABS tablet Take 1 tablet (2.5 mg total) by mouth 2 (two) times daily. 02/10/22   Hoyt Koch, MD  ?Baclofen 5 MG TABS Take 1 tablet by mouth 2 (two) times daily as needed. 12/30/21   [provider]  ?benzonatate (TESSALON) 100 MG capsule SMARTSIG:1-2 Capsule(s) By Mouth 1-2 Times Daily PRN 12/23/21   [provider]  ?carvedilol (COREG) 25 MG tablet Take 1 tablet (25 mg total) by mouth 2 (two) times daily with a meal. 01/26/22   Hoyt Koch, MD  ?cilostazol (PLETAL) 100 MG tablet TAKE 1 TABLET BY MOUTH TWICE A DAY 02/03/22   Hoyt Koch, MD  ?cilostazol (PLETAL) 100 MG tablet Take 100 mg by mouth 2 (two) times daily. 01/03/22   [provider]  ?docusate sodium (COLACE) 100 MG capsule Take 1 capsule (100 mg total) by mouth daily. 01/09/22   Little Ishikawa, MD  ?doxycycline (VIBRA-TABS) 100 MG tablet Take 1 tablet (100 mg total) by mouth 2 (two) times daily. 01/26/22   Hoyt Koch, MD   ?DULoxetine (CYMBALTA) 20 MG capsule Take 1 capsule (20 mg total) by mouth daily. 07/11/21   Hoyt Koch, MD  ?DULoxetine (CYMBALTA) 20 MG capsule Take 20 mg by mouth daily. 10/26/21   [provider]  ?Ensure (ENSURE) Take 237 mLs by mouth daily.    [provider]  ?fluticasone (FLONASE) 50 MCG/ACT nasal spray SPRAY 2 SPRAYS INTO EACH NOSTRIL EVERY DAY 01/31/22   Janith Lima, MD  ?furosemide (LASIX) 20 MG tablet Take 1 tablet (20 mg total) by mouth daily as needed. 01/26/22   Hoyt Koch, MD  ?hydrALAZINE (APRESOLINE) 100 MG tablet Take 1 tablet (100 mg total) by mouth 3 (three) times daily. 03/03/22   Hoyt Koch, MD  ?HYDROcodone-acetaminophen (HYCET) 7.5-325 mg/15 ml solution Take 5 mLs by mouth every 6 (six) hours as needed. 10/30/21   [provider]  ?isosorbide mononitrate (IMDUR) 60 MG 24 hr tablet Take 1 tablet (60 mg total) by mouth daily. 01/09/22   Little Ishikawa, MD  ?isosorbide mononitrate (IMDUR) 60 MG 24 hr tablet TAKE 1 TABLET BY MOUTH EVERY DAY 02/24/22   Hoyt Koch, MD  ?isosorbide mononitrate (IMDUR) 60 MG 24 hr tablet Take 60 mg by mouth daily. 11/13/21   [provider]  ?levothyroxine (SYNTHROID) 50 MCG tablet TAKE 1 TABLET BY MOUTH DAILY BEFORE BREAKFAST ?Patient taking differently: Take 50 mcg by mouth daily before breakfast. 03/14/21  Hoyt Koch, MD  ?levothyroxine (SYNTHROID) 50 MCG tablet Take 50 mcg by mouth every morning. 11/13/21   [provider]  ?loperamide (IMODIUM A-D) 2 MG tablet Take 2 mg by mouth 4 (four) times daily as needed for diarrhea or loose stools.    [provider]  ?LORazepam (ATIVAN) 0.5 MG tablet Take 1 tablet (0.5 mg total) by mouth daily as needed for anxiety. 03/03/22   Hoyt Koch, MD  ?metoprolol tartrate (LOPRESSOR) 50 MG tablet Take 50 mg by mouth 2 (two) times daily. 11/13/21   [provider]  ?mirtazapine (REMERON) 15 MG tablet  Take 15 mg by mouth at bedtime.    [provider]  ?ondansetron (ZOFRAN) 4 MG tablet TAKE 1 TABLET BY MOUTH EVERY 8 HOURS AS NEEDED FOR NAUSEA AND VOMITING 02/03/22   Hoyt Koch, MD  ?ondansetron (ZOFRAN) 4 MG tablet Take 4 mg by mouth every 8 (eight) hours as needed. 12/30/21   [provider]  ?predniSONE (DELTASONE) 20 MG tablet Take 40 mg by mouth every morning. 11/26/21   [provider]  ?rosuvastatin (CRESTOR) 20 MG tablet Take 1 tablet (20 mg total) by mouth daily. 04/07/21   Hoyt Koch, MD  ?rosuvastatin (CRESTOR) 20 MG tablet Take 20 mg by mouth daily. 01/03/22   [provider]  ?levocetirizine (XYZAL) 5 MG tablet TAKE 1 TABLET(5 MG) BY MOUTH EVERY EVENING 02/21/20 06/10/20  Janith Lima, MD  ?   ? ?Allergies    ?Xyzal [levocetirizine dihydrochloride], Augmentin [amoxicillin-pot clavulanate], and Tribenzor [olmesartan-amlodipine-hctz]   ? ?Review of Systems   ?Review of Systems  ?Constitutional:  Negative for fever.  ?HENT:  Positive for congestion.   ?Respiratory:  Positive for cough and shortness of breath.   ?Cardiovascular:  Negative for chest pain.  ?Gastrointestinal:  Negative for abdominal pain, diarrhea, nausea and vomiting.  ?Genitourinary:  Negative for dysuria.  ? ?Physical Exam ?Updated Vital Signs ?BP (!) 161/73 (BP Location: Left Arm)   Pulse 83   Temp 98.4 ?F (36.9 ?C) (Oral)   Resp 17   Ht '5\' 3"'$  (1.6 m)   Wt 52.2 kg   SpO2 93%   BMI 20.37 kg/m?  ?Physical Exam ?Constitutional:   ?   General: She is not in acute distress. ?HENT:  ?   Head: Normocephalic.  ?   Mouth/Throat:  ?   Comments: Tracheostomy in place ?Eyes:  ?   Pupils: Pupils are equal, round, and reactive to light.  ?Cardiovascular:  ?   Rate and Rhythm: Normal rate and regular rhythm.  ?Pulmonary:  ?   Effort: Pulmonary effort is normal. Tachypnea present. No respiratory distress.  ?   Breath sounds: No decreased breath sounds, wheezing, rhonchi or rales.  ?Chest:  ?    Chest wall: No tenderness.  ?Abdominal:  ?   Palpations: Abdomen is soft.  ?   Tenderness: There is no abdominal tenderness.  ?Musculoskeletal:  ?   Cervical back: Normal range of motion.  ?Skin: ?   General: Skin is warm and dry.  ?Neurological:  ?   Mental Status: She is alert.  ? ? ?ED Results / Procedures / Treatments   ?Labs ?(all labs ordered are listed, but only abnormal results are displayed) ?Labs Reviewed  ?BASIC METABOLIC PANEL - Abnormal; Notable for the following components:  ?    Result Value  ? CO2 21 (*)   ? Glucose, Bld 171 (*)   ? BUN 45 (*)   ?  Creatinine, Ser 3.28 (*)   ? GFR, Estimated 14 (*)   ? All other components within normal limits  ?BRAIN NATRIURETIC PEPTIDE - Abnormal; Notable for the following components:  ? B Natriuretic Peptide 128.1 (*)   ? All other components within normal limits  ?CBC WITH DIFFERENTIAL/PLATELET - Abnormal; Notable for the following components:  ? WBC 11.9 (*)   ? RBC 2.94 (*)   ? Hemoglobin 8.4 (*)   ? HCT 25.4 (*)   ? Neutro Abs 9.7 (*)   ? Abs Immature Granulocytes 0.16 (*)   ? All other components within normal limits  ?RESP PANEL BY RT-PCR (FLU A&B, COVID) ARPGX2  ? ? ?EKG ?EKG Interpretation ? ?Date/Time:  Tuesday March 03 2022 16:26:13 EDT ?Ventricular Rate:  87 ?PR Interval:  142 ?QRS Duration: 82 ?QT Interval:  390 ?QTC Calculation: 470 ?R Axis:   39 ?Text Interpretation: Sinus rhythm Anteroseptal infarct, age indeterminate Confirmed by Dene Gentry [phone]) on 03/03/2022 6:33:17 PM ? ?Radiology ?DG Chest 2 View ? ?Result Date: 03/03/2022 ?CLINICAL DATA:  Dyspnea EXAM: CHEST - 2 VIEW COMPARISON:  01/22/2022 chest radiograph. FINDINGS: Stable cardiomediastinal silhouette with mild cardiomegaly. No pneumothorax. No pleural effusion. No pulmonary edema. Mild patchy right middle lobe opacity appears new. IMPRESSION: 1. New mild patchy right middle lobe opacity, cannot exclude pneumonia. Follow-up chest radiographs advised post therapy to document resolution.  2. Stable mild cardiomegaly without pulmonary edema. Electronically Signed   By: Ilona Sorrel M.D.   On: 03/03/2022 17:14   ? ?Procedures ?Procedures  ? ? ?Medications Ordered in ED ?Medications - No data to displa

## 2022-03-03 NOTE — ED Notes (Signed)
Patient updated on plan of care.  Repositioned in bed for comfort.  Provided with warm blanket ?

## 2022-03-03 NOTE — ED Provider Triage Note (Signed)
Emergency Medicine Provider Triage Evaluation Note ? ?Bosie Helper , a 76 y.o. female  was evaluated in triage.  Pt complains of 4 days of increasing shortness of breath and cough.  The patient complains of large amounts of greenish-brown sputum as well.  No known fever.  No known sick contacts. No body aches. No chest pain. Patient does have a tracheostomy.  ? ?Review of Systems  ?Positive: Shortness of breath, productive cough ?Negative: Chest pain, fever, body ache ? ?Physical Exam  ?BP (!) 167/73 (BP Location: Left Arm)   Pulse 94   Temp 98.4 ?F (36.9 ?C) (Oral)   Resp (!) 24   Ht '5\' 3"'$  (1.6 m)   Wt 52.2 kg   SpO2 94%   BMI 20.37 kg/m?  ?Gen:   Awake, no distress  ?Resp:  Normal effort, lungs CTA ?MSK:   Moves extremities without difficulty  ?Other:   ? ?Medical Decision Making  ?Medically screening exam initiated at 4:55 PM.  Appropriate orders placed.  Izadora Roehr Clinkscale was informed that the remainder of the evaluation will be completed by another provider, this initial triage assessment does not replace that evaluation, and the importance of remaining in the ED until their evaluation is complete. ? ?Plan for basic labs and imaging ?  ?Dorothyann Peng, PA-C ?03/03/22 1700 ? ?

## 2022-03-03 NOTE — ED Triage Notes (Addendum)
Patient c/o SOB, expiratory wheezing, abdominal pain, and a productive cough with green/brown sputum. ? ?Patient has a trach. ?

## 2022-03-03 NOTE — Discharge Instructions (Addendum)
You were seen today for shortness of breath. Chest x-ray and lab work show a possible pneumonia. I have ordered antibiotics for you to take. Please complete the entire course of medication. You should follow up with your PCP for a repeat chest x-ray after completion of therapy. Return to the emergency department if you develop increased shortness of breath, chest pain, or other life threatening conditions.  ?

## 2022-03-05 ENCOUNTER — Ambulatory Visit: Payer: Medicare Other | Admitting: Internal Medicine

## 2022-03-08 ENCOUNTER — Other Ambulatory Visit: Payer: Self-pay | Admitting: Internal Medicine

## 2022-03-18 ENCOUNTER — Ambulatory Visit: Payer: Medicare Other | Admitting: Internal Medicine

## 2022-03-19 ENCOUNTER — Ambulatory Visit (INDEPENDENT_AMBULATORY_CARE_PROVIDER_SITE_OTHER): Payer: Medicare Other | Admitting: Internal Medicine

## 2022-03-19 ENCOUNTER — Encounter: Payer: Self-pay | Admitting: Internal Medicine

## 2022-03-19 ENCOUNTER — Ambulatory Visit (INDEPENDENT_AMBULATORY_CARE_PROVIDER_SITE_OTHER): Payer: Medicare Other

## 2022-03-19 VITALS — BP 132/78 | HR 64 | Resp 18 | Ht 63.0 in | Wt 122.4 lb

## 2022-03-19 DIAGNOSIS — F331 Major depressive disorder, recurrent, moderate: Secondary | ICD-10-CM | POA: Diagnosis not present

## 2022-03-19 DIAGNOSIS — J189 Pneumonia, unspecified organism: Secondary | ICD-10-CM

## 2022-03-19 DIAGNOSIS — J439 Emphysema, unspecified: Secondary | ICD-10-CM | POA: Diagnosis not present

## 2022-03-19 DIAGNOSIS — F411 Generalized anxiety disorder: Secondary | ICD-10-CM | POA: Diagnosis not present

## 2022-03-19 MED ORDER — LORAZEPAM 0.5 MG PO TABS: 0.5000 mg | ORAL_TABLET | Freq: Two times a day (BID) | ORAL | 0 refills | Status: DC

## 2022-03-19 NOTE — Assessment & Plan Note (Signed)
Temporary increase in atorivan 0.5 mg BID prn due to several serious illnesses lately and rx done today. Will reassess in 3-6 months.  ?

## 2022-03-19 NOTE — Assessment & Plan Note (Signed)
Has temporarily increased lorazepam to BID due to multiple serious illnesses and hospitalizations recently. Have changed dosing to BID prn and filled for 90 day as [place] is [place] upcoming soon. Counseled son and patient about risk of usage. They wish to proceed with new rx. She will also still take cymbalta 20 mg daily.  ?

## 2022-03-19 NOTE — Progress Notes (Signed)
? ?  Subjective:  ? ?Patient ID: [name], female    DOB: 07-04-1946, 76 y.o.   MRN: [id number] ? ?HPI ?The patient is a 76 YO female coming in for ER follow up CAP. Feels much improved since antibiotics.  ? ?Review of Systems  ?Constitutional: Negative.   ?HENT: Negative.    ?Eyes: Negative.   ?Respiratory:  Positive for cough. Negative for chest tightness and shortness of breath.   ?Cardiovascular:  Negative for chest pain, palpitations and leg swelling.  ?Gastrointestinal:  Negative for abdominal distention, abdominal pain, constipation, diarrhea, nausea and vomiting.  ?Musculoskeletal: Negative.   ?Skin: Negative.   ?Neurological: Negative.   ?Psychiatric/Behavioral: Negative.    ? ?Objective:  ?Physical Exam ?Constitutional:   ?   Appearance: She is well-developed.  ?HENT:  ?   Head: Normocephalic and atraumatic.  ?Cardiovascular:  ?   Rate and Rhythm: Normal rate and regular rhythm.  ?   Comments: Trach in place ?Pulmonary:  ?   Effort: Pulmonary effort is normal. No respiratory distress.  ?   Breath sounds: Normal breath sounds. No wheezing or rales.  ?Abdominal:  ?   General: Bowel sounds are normal. There is no distension.  ?   Palpations: Abdomen is soft.  ?   Tenderness: There is no abdominal tenderness. There is no rebound.  ?Musculoskeletal:  ?   Cervical back: Normal range of motion.  ?Skin: ?   General: Skin is warm and dry.  ?Neurological:  ?   Mental Status: She is alert and oriented to person, place, and time.  ?   Coordination: Coordination normal.  ? ? ?Vitals:  ? 03/19/22 1034  ?BP: 132/78  ?Pulse: 64  ?Resp: 18  ?Weight: 122 lb 6.4 oz (55.5 kg)  ?Height: '5\' 3"'$  (1.6 m)  ? ?This visit occurred during the SARS-CoV-2 public health emergency.  Safety protocols were in place, including screening questions prior to the visit, additional usage of staff PPE, and extensive cleaning of exam room while observing appropriate contact time as indicated for disinfecting solutions.  ? ?Assessment & Plan:   ? ?

## 2022-03-19 NOTE — Patient Instructions (Signed)
You can take claritin up to 3 times a day for the itching. ?

## 2022-03-19 NOTE — Assessment & Plan Note (Signed)
Repeat CXR today as she is leaving to [place] soon and will not be around in 3-4 weeks. May not be fully resolved as this is sooner than we typically recheck but if no improvement or worsening would need re-treatment.  ?

## 2022-04-07 ENCOUNTER — Telehealth: Payer: Self-pay

## 2022-04-07 NOTE — Telephone Encounter (Signed)
Pt is scheduled to have surgery to stretch esophagus and need Medical clearance to stop blood thinner apixaban (ELIQUIS) 2.5 MG TABS tablet for 1 mo. ? ?Please advise ?

## 2022-04-07 NOTE — Telephone Encounter (Signed)
Can she clarify surgical procedure? (Is a Psychologist, sport and exercise doing this or a GI doctor) Typically an esophagus stretching is done during en endoscopy (EGD) and anticoagulation would need to be stopped only 2 days prior and could resume about 1 day afterwards.  ?

## 2022-04-15 ENCOUNTER — Other Ambulatory Visit: Payer: Self-pay | Admitting: Internal Medicine

## 2022-04-15 DIAGNOSIS — E785 Hyperlipidemia, unspecified: Secondary | ICD-10-CM

## 2022-04-23 ENCOUNTER — Other Ambulatory Visit: Payer: Self-pay | Admitting: Internal Medicine

## 2022-04-28 ENCOUNTER — Other Ambulatory Visit: Payer: Self-pay | Admitting: Internal Medicine

## 2022-04-28 DIAGNOSIS — E785 Hyperlipidemia, unspecified: Secondary | ICD-10-CM

## 2022-04-28 DIAGNOSIS — E038 Other specified hypothyroidism: Secondary | ICD-10-CM

## 2022-05-20 ENCOUNTER — Other Ambulatory Visit: Payer: Self-pay | Admitting: Internal Medicine

## 2022-05-24 ENCOUNTER — Other Ambulatory Visit: Payer: Self-pay | Admitting: Internal Medicine

## 2022-07-16 ENCOUNTER — Other Ambulatory Visit: Payer: Self-pay | Admitting: Internal Medicine

## 2022-07-16 DIAGNOSIS — Z9002 Acquired absence of larynx: Secondary | ICD-10-CM | POA: Diagnosis not present

## 2022-07-16 DIAGNOSIS — J392 Other diseases of pharynx: Secondary | ICD-10-CM | POA: Diagnosis not present

## 2022-07-16 DIAGNOSIS — R1314 Dysphagia, pharyngoesophageal phase: Secondary | ICD-10-CM | POA: Diagnosis not present

## 2022-07-16 DIAGNOSIS — Z8521 Personal history of malignant neoplasm of larynx: Secondary | ICD-10-CM | POA: Diagnosis not present

## 2022-07-16 DIAGNOSIS — R1310 Dysphagia, unspecified: Secondary | ICD-10-CM | POA: Diagnosis not present

## 2022-07-16 DIAGNOSIS — Z9889 Other specified postprocedural states: Secondary | ICD-10-CM | POA: Diagnosis not present

## 2022-07-16 DIAGNOSIS — Z87891 Personal history of nicotine dependence: Secondary | ICD-10-CM | POA: Diagnosis not present

## 2022-07-17 NOTE — Telephone Encounter (Signed)
Check [facility] [place] registry last filled 06/26/2022. Not due until 07/27/22.Marland KitchenJohny Shaw

## 2022-07-23 ENCOUNTER — Other Ambulatory Visit: Payer: Self-pay | Admitting: Internal Medicine

## 2022-07-29 ENCOUNTER — Other Ambulatory Visit: Payer: Self-pay | Admitting: Internal Medicine

## 2022-07-29 ENCOUNTER — Other Ambulatory Visit: Payer: Self-pay

## 2022-07-29 DIAGNOSIS — E785 Hyperlipidemia, unspecified: Secondary | ICD-10-CM

## 2022-08-06 ENCOUNTER — Inpatient Hospital Stay (HOSPITAL_COMMUNITY)
Admission: EM | Admit: 2022-08-06 | Discharge: 2022-08-08 | DRG: 305 | Disposition: A | Discharge status: Home / Self Care | Payer: Medicare Other | Attending: Internal Medicine | Admitting: Internal Medicine

## 2022-08-06 ENCOUNTER — Other Ambulatory Visit: Payer: Self-pay

## 2022-08-06 ENCOUNTER — Encounter (HOSPITAL_COMMUNITY): Payer: Self-pay | Admitting: Emergency Medicine

## 2022-08-06 ENCOUNTER — Emergency Department (HOSPITAL_COMMUNITY): Payer: Medicare Other

## 2022-08-06 DIAGNOSIS — Z8673 Personal history of transient ischemic attack (TIA), and cerebral infarction without residual deficits: Secondary | ICD-10-CM

## 2022-08-06 DIAGNOSIS — G8929 Other chronic pain: Secondary | ICD-10-CM | POA: Diagnosis present

## 2022-08-06 DIAGNOSIS — K219 Gastro-esophageal reflux disease without esophagitis: Secondary | ICD-10-CM | POA: Diagnosis present

## 2022-08-06 DIAGNOSIS — J449 Chronic obstructive pulmonary disease, unspecified: Secondary | ICD-10-CM | POA: Diagnosis present

## 2022-08-06 DIAGNOSIS — M549 Dorsalgia, unspecified: Secondary | ICD-10-CM | POA: Diagnosis present

## 2022-08-06 DIAGNOSIS — F419 Anxiety disorder, unspecified: Secondary | ICD-10-CM | POA: Diagnosis present

## 2022-08-06 DIAGNOSIS — Z888 Allergy status to other drugs, medicaments and biological substances status: Secondary | ICD-10-CM

## 2022-08-06 DIAGNOSIS — I10 Essential (primary) hypertension: Secondary | ICD-10-CM | POA: Diagnosis not present

## 2022-08-06 DIAGNOSIS — R7989 Other specified abnormal findings of blood chemistry: Secondary | ICD-10-CM | POA: Diagnosis present

## 2022-08-06 DIAGNOSIS — Z7901 Long term (current) use of anticoagulants: Secondary | ICD-10-CM

## 2022-08-06 DIAGNOSIS — R0600 Dyspnea, unspecified: Secondary | ICD-10-CM | POA: Diagnosis not present

## 2022-08-06 DIAGNOSIS — I161 Hypertensive emergency: Secondary | ICD-10-CM | POA: Diagnosis present

## 2022-08-06 DIAGNOSIS — G43909 Migraine, unspecified, not intractable, without status migrainosus: Secondary | ICD-10-CM | POA: Diagnosis present

## 2022-08-06 DIAGNOSIS — R918 Other nonspecific abnormal finding of lung field: Secondary | ICD-10-CM | POA: Diagnosis not present

## 2022-08-06 DIAGNOSIS — R0602 Shortness of breath: Principal | ICD-10-CM

## 2022-08-06 DIAGNOSIS — E7800 Pure hypercholesterolemia, unspecified: Secondary | ICD-10-CM | POA: Diagnosis present

## 2022-08-06 DIAGNOSIS — F32A Depression, unspecified: Secondary | ICD-10-CM | POA: Diagnosis present

## 2022-08-06 DIAGNOSIS — I739 Peripheral vascular disease, unspecified: Secondary | ICD-10-CM | POA: Diagnosis present

## 2022-08-06 DIAGNOSIS — E039 Hypothyroidism, unspecified: Secondary | ICD-10-CM | POA: Diagnosis present

## 2022-08-06 DIAGNOSIS — Z79890 Hormone replacement therapy (postmenopausal): Secondary | ICD-10-CM

## 2022-08-06 DIAGNOSIS — Z930 Tracheostomy status: Secondary | ICD-10-CM | POA: Diagnosis not present

## 2022-08-06 DIAGNOSIS — Z923 Personal history of irradiation: Secondary | ICD-10-CM | POA: Diagnosis not present

## 2022-08-06 DIAGNOSIS — E872 Acidosis, unspecified: Secondary | ICD-10-CM | POA: Diagnosis present

## 2022-08-06 DIAGNOSIS — I480 Paroxysmal atrial fibrillation: Secondary | ICD-10-CM | POA: Diagnosis present

## 2022-08-06 DIAGNOSIS — R778 Other specified abnormalities of plasma proteins: Secondary | ICD-10-CM | POA: Diagnosis not present

## 2022-08-06 DIAGNOSIS — I5032 Chronic diastolic (congestive) heart failure: Secondary | ICD-10-CM | POA: Diagnosis present

## 2022-08-06 DIAGNOSIS — Z85819 Personal history of malignant neoplasm of unspecified site of lip, oral cavity, and pharynx: Secondary | ICD-10-CM

## 2022-08-06 DIAGNOSIS — G4700 Insomnia, unspecified: Secondary | ICD-10-CM | POA: Diagnosis present

## 2022-08-06 DIAGNOSIS — N184 Chronic kidney disease, stage 4 (severe): Secondary | ICD-10-CM | POA: Diagnosis present

## 2022-08-06 DIAGNOSIS — Z20822 Contact with and (suspected) exposure to covid-19: Secondary | ICD-10-CM | POA: Diagnosis present

## 2022-08-06 DIAGNOSIS — Z931 Gastrostomy status: Secondary | ICD-10-CM

## 2022-08-06 DIAGNOSIS — Z808 Family history of malignant neoplasm of other organs or systems: Secondary | ICD-10-CM

## 2022-08-06 DIAGNOSIS — Z8521 Personal history of malignant neoplasm of larynx: Secondary | ICD-10-CM

## 2022-08-06 DIAGNOSIS — Z87891 Personal history of nicotine dependence: Secondary | ICD-10-CM

## 2022-08-06 DIAGNOSIS — I130 Hypertensive heart and chronic kidney disease with heart failure and stage 1 through stage 4 chronic kidney disease, or unspecified chronic kidney disease: Secondary | ICD-10-CM | POA: Diagnosis present

## 2022-08-06 DIAGNOSIS — Z880 Allergy status to penicillin: Secondary | ICD-10-CM

## 2022-08-06 DIAGNOSIS — Z79899 Other long term (current) drug therapy: Secondary | ICD-10-CM

## 2022-08-06 DIAGNOSIS — Z8542 Personal history of malignant neoplasm of other parts of uterus: Secondary | ICD-10-CM

## 2022-08-06 DIAGNOSIS — R519 Headache, unspecified: Secondary | ICD-10-CM | POA: Diagnosis not present

## 2022-08-06 DIAGNOSIS — R0601 Orthopnea: Secondary | ICD-10-CM | POA: Diagnosis present

## 2022-08-06 DIAGNOSIS — R7303 Prediabetes: Secondary | ICD-10-CM | POA: Diagnosis present

## 2022-08-06 LAB — CBC WITH DIFFERENTIAL/PLATELET
Abs Immature Granulocytes: 0.02 10*3/uL (ref 0.00–0.07)
Basophils Absolute: 0 10*3/uL (ref 0.0–0.1)
Basophils Relative: 1 %
Eosinophils Absolute: 0.1 10*3/uL (ref 0.0–0.5)
Eosinophils Relative: 2 %
HCT: 28.7 % — ABNORMAL LOW (ref 36.0–46.0)
Hemoglobin: 8.9 g/dL — ABNORMAL LOW (ref 12.0–15.0)
Immature Granulocytes: 0 %
Lymphocytes Relative: 12 %
Lymphs Abs: 0.8 10*3/uL (ref 0.7–4.0)
MCH: 26.7 pg (ref 26.0–34.0)
MCHC: 31 g/dL (ref 30.0–36.0)
MCV: 86.2 fL (ref 80.0–100.0)
Monocytes Absolute: 0.3 10*3/uL (ref 0.1–1.0)
Monocytes Relative: 5 %
Neutro Abs: 5.2 10*3/uL (ref 1.7–7.7)
Neutrophils Relative %: 80 %
Platelets: 247 10*3/uL (ref 150–400)
RBC: 3.33 MIL/uL — ABNORMAL LOW (ref 3.87–5.11)
RDW: 15.5 % (ref 11.5–15.5)
WBC: 6.5 10*3/uL (ref 4.0–10.5)
nRBC: 0 % (ref 0.0–0.2)

## 2022-08-06 LAB — BASIC METABOLIC PANEL
Anion gap: 10 (ref 5–15)
BUN: 50 mg/dL — ABNORMAL HIGH (ref 8–23)
CO2: 19 mmol/L — ABNORMAL LOW (ref 22–32)
Calcium: 9.3 mg/dL (ref 8.9–10.3)
Chloride: 110 mmol/L (ref 98–111)
Creatinine, Ser: 3.4 mg/dL — ABNORMAL HIGH (ref 0.44–1.00)
GFR, Estimated: 13 mL/min — ABNORMAL LOW (ref >60)
Glucose, Bld: 155 mg/dL — ABNORMAL HIGH (ref 70–99)
Potassium: 4.5 mmol/L (ref 3.5–5.1)
Sodium: 139 mmol/L (ref 135–145)

## 2022-08-06 LAB — SARS CORONAVIRUS 2 BY RT PCR: SARS Coronavirus 2 by RT PCR: NEGATIVE

## 2022-08-06 MED ORDER — IPRATROPIUM BROMIDE 0.02 % IN SOLN
0.5000 mg | Freq: Once | RESPIRATORY_TRACT | Status: AC
  Administered 2022-08-06: 0.5 mg via RESPIRATORY_TRACT
  Filled 2022-08-06: qty 2.5

## 2022-08-06 MED ORDER — ALBUTEROL SULFATE HFA 108 (90 BASE) MCG/ACT IN AERS
2.0000 | INHALATION_SPRAY | RESPIRATORY_TRACT | Status: DC | PRN
  Administered 2022-08-06: 2 via RESPIRATORY_TRACT
  Filled 2022-08-06: qty 6.7

## 2022-08-06 MED ORDER — MORPHINE SULFATE (PF) 4 MG/ML IV SOLN: 4.0000 mg | Freq: Once | INTRAVENOUS | Status: DC

## 2022-08-06 MED ORDER — LACTATED RINGERS IV SOLN: INTRAVENOUS | Status: DC

## 2022-08-06 MED ORDER — HYDRALAZINE HCL 20 MG/ML IJ SOLN
5.0000 mg | Freq: Once | INTRAMUSCULAR | Status: AC
  Administered 2022-08-06: 5 mg via INTRAVENOUS
  Filled 2022-08-06: qty 1

## 2022-08-06 MED ORDER — FENTANYL CITRATE PF 50 MCG/ML IJ SOSY
25.0000 ug | PREFILLED_SYRINGE | Freq: Once | INTRAMUSCULAR | Status: AC
  Administered 2022-08-06: 25 ug via INTRAVENOUS
  Filled 2022-08-06: qty 1

## 2022-08-06 MED ORDER — LABETALOL HCL 5 MG/ML IV SOLN
10.0000 mg | Freq: Once | INTRAVENOUS | Status: AC
  Administered 2022-08-06: 10 mg via INTRAVENOUS
  Filled 2022-08-06: qty 4

## 2022-08-06 MED ORDER — HYDRALAZINE HCL 20 MG/ML IJ SOLN
10.0000 mg | Freq: Once | INTRAMUSCULAR | Status: AC
  Administered 2022-08-06: 10 mg via INTRAVENOUS
  Filled 2022-08-06: qty 1

## 2022-08-06 MED ORDER — ALBUTEROL SULFATE (2.5 MG/3ML) 0.083% IN NEBU
5.0000 mg | INHALATION_SOLUTION | Freq: Once | RESPIRATORY_TRACT | Status: AC
  Administered 2022-08-06: 5 mg via RESPIRATORY_TRACT
  Filled 2022-08-06: qty 6

## 2022-08-06 MED ORDER — FENTANYL CITRATE PF 50 MCG/ML IJ SOSY
50.0000 ug | PREFILLED_SYRINGE | Freq: Once | INTRAMUSCULAR | Status: AC
  Administered 2022-08-06: 50 ug via INTRAVENOUS
  Filled 2022-08-06: qty 1

## 2022-08-06 NOTE — Progress Notes (Signed)
RT called to suction pt, pt stoma suctioned with saline, no complications, moderate, thick tan secretions obtained, pt tolerated well

## 2022-08-06 NOTE — ED Provider Notes (Signed)
11:21 PM Assumed care from Dr. Zenia Resides, please see their note for full history, physical and decision making until this point. In brief this is a 76 y.o. year old female who presented to the ED tonight with Shortness of Breath     DOE. Wheezing on exam. Hypertensive. CT wo contrast normal (CKD GFR 15, can't get contrast). Getting hydralazine.   Pending D dimer, ct head, BNP. Getting meds. Reeval for dispo.   On reeval, BP back up, HA back, still dyspneic. BNP elevated. Daughter states she hasn't had her ativan or had anything to eat today nor her BP meds. Has h/o migraines. Will give HA cocktail, ativan and await other labs.   D dimer elevated. VQ ordered. Will admit for HTN vs PE.   CRITICAL CARE Performed by: Merrily Pew Total critical care time: 35 minutes Critical care time was exclusive of separately billable procedures and treating other patients. Critical care was necessary to treat or prevent imminent or life-threatening deterioration. Critical care was time spent personally by me on the following activities: development of treatment plan with patient and/or surrogate as well as nursing, discussions with consultants, evaluation of patient's response to treatment, examination of patient, obtaining history from patient or surrogate, ordering and performing treatments and interventions, ordering and review of laboratory studies, ordering and review of radiographic studies, pulse oximetry and re-evaluation of patient's condition.   Labs, studies and imaging reviewed by myself and considered in medical decision making if ordered. Imaging interpreted by radiology.  Labs Reviewed  CBC WITH DIFFERENTIAL/PLATELET - Abnormal; Notable for the following components:      Result Value   RBC 3.33 (*)    Hemoglobin 8.9 (*)    HCT 28.7 (*)    All other components within normal limits  BASIC METABOLIC PANEL - Abnormal; Notable for the following components:   CO2 19 (*)    Glucose, Bld 155 (*)     BUN 50 (*)    Creatinine, Ser 3.40 (*)    GFR, Estimated 13 (*)    All other components within normal limits  SARS CORONAVIRUS 2 BY RT PCR  D-DIMER, QUANTITATIVE    CT CHEST WO CONTRAST  Final Result    DG Chest 2 View  Final Result      No follow-ups on file.    Maddisen Vought, Corene Cornea, MD 08/07/22 0500

## 2022-08-06 NOTE — ED Provider Notes (Signed)
[facility] DEPT Provider Note   CSN: [id number] Arrival date & time: 08/06/22  1649     History  Chief Complaint  Patient presents with   Shortness of Breath    [name] is a 76 y.o. female.  76 year old female with history of throat cancer presents with 2 days of dyspnea.  Patient has had original surgery does have a tracheostomy site.  Patient notes that she has no trouble swallowing and denies any trouble getting air in.  Notes she has had a slight cough and some wheezing.  Does have a history of remote tobacco use.  No fevers.  Symptoms are slightly exertional.  No vomiting or diarrhea noted.      Home Medications Prior to Admission medications   Medication Sig Start Date End Date Taking? Authorizing Provider  amLODipine (NORVASC) 10 MG tablet Take 1 tablet (10 mg total) by mouth daily. 01/09/22   Little Ishikawa, MD  apixaban (ELIQUIS) 2.5 MG TABS tablet Take 1 tablet (2.5 mg total) by mouth 2 (two) times daily. 02/10/22   Hoyt Koch, MD  Baclofen 5 MG TABS Take 1 tablet by mouth 2 (two) times daily as needed. 12/30/21   [provider]  benzonatate (TESSALON) 100 MG capsule SMARTSIG:1-2 Capsule(s) By Mouth 1-2 Times Daily PRN 12/23/21   [provider]  carvedilol (COREG) 25 MG tablet TAKE 1 TABLET (25 MG TOTAL) BY MOUTH TWICE A DAY WITH MEALS 05/26/22   Hoyt Koch, MD  cilostazol (PLETAL) 100 MG tablet TAKE 1 TABLET BY MOUTH TWICE A DAY 02/03/22   Hoyt Koch, MD  cilostazol (PLETAL) 100 MG tablet Take 100 mg by mouth 2 (two) times daily. 01/03/22   [provider]  docusate sodium (COLACE) 100 MG capsule Take 1 capsule (100 mg total) by mouth daily. 01/09/22   Little Ishikawa, MD  DULoxetine (CYMBALTA) 20 MG capsule TAKE 1 CAPSULE BY MOUTH EVERY DAY 07/23/22   Hoyt Koch, MD  Ensure (ENSURE) Take 237 mLs by mouth daily.    [provider]  fluticasone  (FLONASE) 50 MCG/ACT nasal spray SPRAY 2 SPRAYS INTO EACH NOSTRIL EVERY DAY 01/31/22   Janith Lima, MD  furosemide (LASIX) 20 MG tablet Take 1 tablet (20 mg total) by mouth daily as needed. 05/20/22   Hoyt Koch, MD  hydrALAZINE (APRESOLINE) 100 MG tablet Take 1 tablet (100 mg total) by mouth 3 (three) times daily. 03/03/22   Hoyt Koch, MD  HYDROcodone-acetaminophen (HYCET) 7.5-325 mg/15 ml solution Take 5 mLs by mouth every 6 (six) hours as needed. 10/30/21   [provider]  isosorbide mononitrate (IMDUR) 60 MG 24 hr tablet TAKE 1 TABLET BY MOUTH EVERY DAY 07/29/22   Hoyt Koch, MD  levothyroxine (SYNTHROID) 50 MCG tablet TAKE 1 TABLET BY MOUTH EVERY DAY BEFORE BREAKFAST 04/30/22   Hoyt Koch, MD  loperamide (IMODIUM A-D) 2 MG tablet Take 2 mg by mouth 4 (four) times daily as needed for diarrhea or loose stools.    [provider]  LORazepam (ATIVAN) 0.5 MG tablet Take 1 tablet (0.5 mg total) by mouth 2 (two) times daily. 03/19/22   Hoyt Koch, MD  metoprolol tartrate (LOPRESSOR) 50 MG tablet TAKE 1 TABLET BY MOUTH TWICE A DAY 04/30/22   Hoyt Koch, MD  mirtazapine (REMERON) 15 MG tablet Take 15 mg by mouth at bedtime.    [provider]  ondansetron (ZOFRAN) 4 MG  tablet TAKE 1 TABLET BY MOUTH EVERY 8 HOURS AS NEEDED FOR NAUSEA AND VOMITING 02/03/22   Hoyt Koch, MD  ondansetron (ZOFRAN) 4 MG tablet Take 4 mg by mouth every 8 (eight) hours as needed. 12/30/21   [provider]  predniSONE (DELTASONE) 20 MG tablet Take 40 mg by mouth every morning. 11/26/21   [provider]  rosuvastatin (CRESTOR) 20 MG tablet TAKE 1 TABLET (20 MG TOTAL) BY MOUTH DAILY. 07/29/22   Hoyt Koch, MD  levocetirizine (XYZAL) 5 MG tablet TAKE 1 TABLET(5 MG) BY MOUTH EVERY EVENING 02/21/20 06/10/20  Janith Lima, MD      Allergies    Xyzal [levocetirizine dihydrochloride], Augmentin  [amoxicillin-pot clavulanate], and Tribenzor [olmesartan-amlodipine-hctz]    Review of Systems   Review of Systems  All other systems reviewed and are negative.   Physical Exam Updated Vital Signs BP (!) 153/89 (BP Location: Left Arm)   Pulse (!) 107   Temp 97.6 F (36.4 C) (Axillary)   Resp (!) 29   SpO2 100%  Physical Exam Vitals and nursing note reviewed.  Constitutional:      General: She is not in acute distress.    Appearance: Normal appearance. She is well-developed. She is not toxic-appearing.  HENT:     Head: Normocephalic and atraumatic.  Eyes:     General: Lids are normal.     Conjunctiva/sclera: Conjunctivae normal.     Pupils: Pupils are equal, round, and reactive to light.  Neck:     Thyroid: No thyroid mass.     Trachea: No tracheal deviation.   Cardiovascular:     Rate and Rhythm: Normal rate and regular rhythm.     Heart sounds: Normal heart sounds. No murmur heard.    No gallop.  Pulmonary:     Effort: Pulmonary effort is normal. No respiratory distress.     Breath sounds: Normal breath sounds. No stridor. No decreased breath sounds, wheezing, rhonchi or rales.  Abdominal:     General: There is no distension.     Palpations: Abdomen is soft.     Tenderness: There is no abdominal tenderness. There is no rebound.  Musculoskeletal:        General: No tenderness. Normal range of motion.     Cervical back: Normal range of motion and neck supple.  Skin:    General: Skin is warm and dry.     Findings: No abrasion or rash.  Neurological:     Mental Status: She is alert and oriented to person, place, and time. Mental status is at baseline.     GCS: GCS eye subscore is 4. GCS verbal subscore is 5. GCS motor subscore is 6.     Cranial Nerves: No cranial nerve deficit.     Sensory: No sensory deficit.     Motor: Motor function is intact.  Psychiatric:        Attention and Perception: Attention normal.        Speech: Speech normal.        Behavior:  Behavior normal.    ED Results / Procedures / Treatments   Labs (all labs ordered are listed, but only abnormal results are displayed) Labs Reviewed  CBC WITH DIFFERENTIAL/PLATELET  BASIC METABOLIC PANEL    EKG EKG Interpretation  Date/Time:  Thursday August 06 2022 16:52:47 EDT Ventricular Rate:  103 PR Interval:  125 QRS Duration: 82 QT Interval:  375 QTC Calculation: 491 R Axis:   66 Text Interpretation: Sinus  tachycardia Atrial premature complexes Nonspecific repol abnormality, diffuse leads Confirmed by Lacretia Leigh ([id number]) on 08/06/2022 5:33:56 PM  Radiology No results found.  Procedures Procedures    Medications Ordered in ED Medications  albuterol (VENTOLIN HFA) 108 (90 Base) MCG/ACT inhaler 2 puff (has no administration in time range)  lactated ringers infusion (has no administration in time range)    ED Course/ Medical Decision Making/ A&P                           Medical Decision Making Amount and/or Complexity of Data Reviewed Labs: ordered. Radiology: ordered.  Risk Prescription drug management.   Patient is EKG shows some sinus tachycardia.  Patient had faint wheezing noted on exam here.  Albuterol given with limited relief.  Patient's chest x-ray per my interpretation did not show any acute findings.  Chest CT without contrast due to decreased GFR did not show any acute findings.  Patient's renal function is consistent with chronic kidney disease.  Patient's blood pressure was elevated and was treated with hydralazine.  Complain of mild to moderate headache and was treated with medications for this.  No focal neurological deficits noted on exam there.  Had no emesis here.  Do not feel that she needs to have intracranial imaging.  Feel this is likely related to her high blood pressure.  Due to patient's persistent dyspnea, have ordered D-dimer.  Test is pending.  Of note, she has no signs of new anemia.  If elevated, patient will need admission for VQ  scan and further work-up of her dyspnea.  Case signed out to Dr. Dayna Barker  CRITICAL CARE Performed by: Leota Jacobsen Total critical care time: 40 minutes Critical care time was exclusive of separately billable procedures and treating other patients. Critical care was necessary to treat or prevent imminent or life-threatening deterioration. Critical care was time spent personally by me on the following activities: development of treatment plan with patient and/or surrogate as well as nursing, discussions with consultants, evaluation of patient's response to treatment, examination of patient, obtaining history from patient or surrogate, ordering and performing treatments and interventions, ordering and review of laboratory studies, ordering and review of radiographic studies, pulse oximetry and re-evaluation of patient's condition.         Final Clinical Impression(s) / ED Diagnoses Final diagnoses:  None    Rx / DC Orders ED Discharge Orders     None         Lacretia Leigh, MD 08/06/22 2315

## 2022-08-06 NOTE — ED Triage Notes (Signed)
Pt reports SHOB that began today. Pt does appear to be labored in triage. 100% RA Pt son reports they have had trees cut down recently in neighborhood and thinks that has something to do with it.

## 2022-08-07 ENCOUNTER — Inpatient Hospital Stay (HOSPITAL_COMMUNITY): Payer: Medicare Other

## 2022-08-07 DIAGNOSIS — G4700 Insomnia, unspecified: Secondary | ICD-10-CM | POA: Diagnosis present

## 2022-08-07 DIAGNOSIS — I5032 Chronic diastolic (congestive) heart failure: Secondary | ICD-10-CM | POA: Diagnosis present

## 2022-08-07 DIAGNOSIS — G8929 Other chronic pain: Secondary | ICD-10-CM | POA: Diagnosis present

## 2022-08-07 DIAGNOSIS — I130 Hypertensive heart and chronic kidney disease with heart failure and stage 1 through stage 4 chronic kidney disease, or unspecified chronic kidney disease: Secondary | ICD-10-CM | POA: Diagnosis present

## 2022-08-07 DIAGNOSIS — J449 Chronic obstructive pulmonary disease, unspecified: Secondary | ICD-10-CM | POA: Diagnosis present

## 2022-08-07 DIAGNOSIS — I739 Peripheral vascular disease, unspecified: Secondary | ICD-10-CM | POA: Diagnosis present

## 2022-08-07 DIAGNOSIS — E7800 Pure hypercholesterolemia, unspecified: Secondary | ICD-10-CM | POA: Diagnosis present

## 2022-08-07 DIAGNOSIS — R778 Other specified abnormalities of plasma proteins: Secondary | ICD-10-CM | POA: Diagnosis not present

## 2022-08-07 DIAGNOSIS — F32A Depression, unspecified: Secondary | ICD-10-CM | POA: Diagnosis present

## 2022-08-07 DIAGNOSIS — E039 Hypothyroidism, unspecified: Secondary | ICD-10-CM | POA: Diagnosis present

## 2022-08-07 DIAGNOSIS — Z8673 Personal history of transient ischemic attack (TIA), and cerebral infarction without residual deficits: Secondary | ICD-10-CM | POA: Diagnosis not present

## 2022-08-07 DIAGNOSIS — I161 Hypertensive emergency: Secondary | ICD-10-CM | POA: Diagnosis present

## 2022-08-07 DIAGNOSIS — I480 Paroxysmal atrial fibrillation: Secondary | ICD-10-CM | POA: Diagnosis present

## 2022-08-07 DIAGNOSIS — M549 Dorsalgia, unspecified: Secondary | ICD-10-CM | POA: Diagnosis present

## 2022-08-07 DIAGNOSIS — Z931 Gastrostomy status: Secondary | ICD-10-CM | POA: Diagnosis not present

## 2022-08-07 DIAGNOSIS — Z930 Tracheostomy status: Secondary | ICD-10-CM | POA: Diagnosis not present

## 2022-08-07 DIAGNOSIS — Z923 Personal history of irradiation: Secondary | ICD-10-CM | POA: Diagnosis not present

## 2022-08-07 DIAGNOSIS — R0601 Orthopnea: Secondary | ICD-10-CM | POA: Diagnosis present

## 2022-08-07 DIAGNOSIS — E872 Acidosis, unspecified: Secondary | ICD-10-CM | POA: Diagnosis present

## 2022-08-07 DIAGNOSIS — Z20822 Contact with and (suspected) exposure to covid-19: Secondary | ICD-10-CM | POA: Diagnosis present

## 2022-08-07 DIAGNOSIS — R0602 Shortness of breath: Secondary | ICD-10-CM

## 2022-08-07 DIAGNOSIS — N184 Chronic kidney disease, stage 4 (severe): Secondary | ICD-10-CM | POA: Diagnosis present

## 2022-08-07 DIAGNOSIS — R0600 Dyspnea, unspecified: Secondary | ICD-10-CM | POA: Diagnosis present

## 2022-08-07 DIAGNOSIS — G43909 Migraine, unspecified, not intractable, without status migrainosus: Secondary | ICD-10-CM | POA: Diagnosis present

## 2022-08-07 DIAGNOSIS — K219 Gastro-esophageal reflux disease without esophagitis: Secondary | ICD-10-CM | POA: Diagnosis present

## 2022-08-07 DIAGNOSIS — Z8521 Personal history of malignant neoplasm of larynx: Secondary | ICD-10-CM | POA: Diagnosis not present

## 2022-08-07 DIAGNOSIS — F419 Anxiety disorder, unspecified: Secondary | ICD-10-CM | POA: Diagnosis present

## 2022-08-07 DIAGNOSIS — R7989 Other specified abnormal findings of blood chemistry: Secondary | ICD-10-CM | POA: Diagnosis present

## 2022-08-07 LAB — RENAL FUNCTION PANEL
Albumin: 3.6 g/dL (ref 3.5–5.0)
Anion gap: 9 (ref 5–15)
BUN: 48 mg/dL — ABNORMAL HIGH (ref 8–23)
CO2: 20 mmol/L — ABNORMAL LOW (ref 22–32)
Calcium: 9.5 mg/dL (ref 8.9–10.3)
Chloride: 111 mmol/L (ref 98–111)
Creatinine, Ser: 3.2 mg/dL — ABNORMAL HIGH (ref 0.44–1.00)
GFR, Estimated: 14 mL/min — ABNORMAL LOW (ref >60)
Glucose, Bld: 156 mg/dL — ABNORMAL HIGH (ref 70–99)
Phosphorus: 4.2 mg/dL (ref 2.5–4.6)
Potassium: 4.4 mmol/L (ref 3.5–5.1)
Sodium: 140 mmol/L (ref 135–145)

## 2022-08-07 LAB — MRSA NEXT GEN BY PCR, NASAL: MRSA by PCR Next Gen: DETECTED — AB

## 2022-08-07 LAB — ECHOCARDIOGRAM COMPLETE
AR max vel: 2.14 cm2
AV Peak grad: 8.8 mmHg
Ao pk vel: 1.48 m/s
Area-P 1/2: 4.86 cm2
S' Lateral: 2 cm

## 2022-08-07 LAB — HEMOGLOBIN A1C
Hgb A1c MFr Bld: 5.5 % (ref 4.8–5.6)
Mean Plasma Glucose: 111.15 mg/dL

## 2022-08-07 LAB — CBC
HCT: 28.3 % — ABNORMAL LOW (ref 36.0–46.0)
Hemoglobin: 9.1 g/dL — ABNORMAL LOW (ref 12.0–15.0)
MCH: 27.5 pg (ref 26.0–34.0)
MCHC: 32.2 g/dL (ref 30.0–36.0)
MCV: 85.5 fL (ref 80.0–100.0)
Platelets: 206 10*3/uL (ref 150–400)
RBC: 3.31 MIL/uL — ABNORMAL LOW (ref 3.87–5.11)
RDW: 15.3 % (ref 11.5–15.5)
WBC: 7.8 10*3/uL (ref 4.0–10.5)
nRBC: 0 % (ref 0.0–0.2)

## 2022-08-07 LAB — TROPONIN I (HIGH SENSITIVITY)
Troponin I (High Sensitivity): 1095 ng/L (ref <18)
Troponin I (High Sensitivity): 1217 ng/L (ref <18)

## 2022-08-07 LAB — BRAIN NATRIURETIC PEPTIDE: B Natriuretic Peptide: 140.7 pg/mL — ABNORMAL HIGH (ref 0.0–100.0)

## 2022-08-07 LAB — D-DIMER, QUANTITATIVE: D-Dimer, Quant: >20 ug/mL-FEU — ABNORMAL HIGH (ref 0.00–0.50)

## 2022-08-07 MED ORDER — ORAL CARE MOUTH RINSE: 15.0000 mL | OROMUCOSAL | Status: DC | PRN

## 2022-08-07 MED ORDER — MIRTAZAPINE 15 MG PO TABS
15.0000 mg | ORAL_TABLET | Freq: Every day | ORAL | Status: DC
  Administered 2022-08-07: 15 mg via ORAL
  Filled 2022-08-07: qty 1

## 2022-08-07 MED ORDER — APIXABAN 2.5 MG PO TABS
2.5000 mg | ORAL_TABLET | Freq: Two times a day (BID) | ORAL | Status: DC
  Administered 2022-08-07 – 2022-08-08 (×4): 2.5 mg via ORAL
  Filled 2022-08-07 (×4): qty 1

## 2022-08-07 MED ORDER — LORAZEPAM 0.5 MG PO TABS
0.5000 mg | ORAL_TABLET | Freq: Two times a day (BID) | ORAL | Status: DC
  Administered 2022-08-07 – 2022-08-08 (×2): 0.5 mg via ORAL
  Filled 2022-08-07 (×2): qty 1

## 2022-08-07 MED ORDER — METOCLOPRAMIDE HCL 5 MG/ML IJ SOLN
10.0000 mg | Freq: Once | INTRAMUSCULAR | Status: AC
  Administered 2022-08-07: 10 mg via INTRAVENOUS
  Filled 2022-08-07: qty 2

## 2022-08-07 MED ORDER — ISOSORBIDE MONONITRATE ER 60 MG PO TB24
60.0000 mg | ORAL_TABLET | Freq: Every day | ORAL | Status: DC
  Administered 2022-08-07 – 2022-08-08 (×2): 60 mg via ORAL
  Filled 2022-08-07 (×2): qty 1

## 2022-08-07 MED ORDER — DIPHENHYDRAMINE HCL 50 MG/ML IJ SOLN
25.0000 mg | Freq: Once | INTRAMUSCULAR | Status: AC
  Administered 2022-08-07: 25 mg via INTRAVENOUS
  Filled 2022-08-07: qty 1

## 2022-08-07 MED ORDER — IPRATROPIUM-ALBUTEROL 0.5-2.5 (3) MG/3ML IN SOLN
3.0000 mL | Freq: Four times a day (QID) | RESPIRATORY_TRACT | Status: DC
  Administered 2022-08-07 – 2022-08-08 (×7): 3 mL via RESPIRATORY_TRACT
  Filled 2022-08-07 (×7): qty 3

## 2022-08-07 MED ORDER — AMLODIPINE BESYLATE 10 MG PO TABS
10.0000 mg | ORAL_TABLET | Freq: Every day | ORAL | Status: DC
  Administered 2022-08-07 – 2022-08-08 (×2): 10 mg via ORAL
  Filled 2022-08-07: qty 2
  Filled 2022-08-07: qty 1

## 2022-08-07 MED ORDER — CHLORHEXIDINE GLUCONATE CLOTH 2 % EX PADS
6.0000 | MEDICATED_PAD | Freq: Every day | CUTANEOUS | Status: DC
  Administered 2022-08-07 (×2): 6 via TOPICAL

## 2022-08-07 MED ORDER — CARVEDILOL 12.5 MG PO TABS: 25.0000 mg | ORAL_TABLET | Freq: Two times a day (BID) | ORAL | Status: DC

## 2022-08-07 MED ORDER — HYDRALAZINE HCL 20 MG/ML IJ SOLN
5.0000 mg | INTRAMUSCULAR | Status: AC
  Administered 2022-08-07: 5 mg via INTRAVENOUS
  Filled 2022-08-07: qty 1

## 2022-08-07 MED ORDER — ACETAMINOPHEN 325 MG PO TABS
650.0000 mg | ORAL_TABLET | Freq: Four times a day (QID) | ORAL | Status: DC | PRN
  Administered 2022-08-07 (×2): 650 mg via ORAL
  Filled 2022-08-07 (×2): qty 2

## 2022-08-07 MED ORDER — CILOSTAZOL 100 MG PO TABS
100.0000 mg | ORAL_TABLET | Freq: Two times a day (BID) | ORAL | Status: DC
  Administered 2022-08-07 – 2022-08-08 (×2): 100 mg via ORAL
  Filled 2022-08-07 (×2): qty 1

## 2022-08-07 MED ORDER — LABETALOL HCL 5 MG/ML IV SOLN: 10.0000 mg | Freq: Once | INTRAVENOUS | Status: DC

## 2022-08-07 MED ORDER — MUPIROCIN 2 % EX OINT
1.0000 | TOPICAL_OINTMENT | Freq: Two times a day (BID) | CUTANEOUS | Status: DC
  Administered 2022-08-07 – 2022-08-08 (×3): 1 via NASAL
  Filled 2022-08-07: qty 22

## 2022-08-07 MED ORDER — MELATONIN 5 MG PO TABS: 5.0000 mg | ORAL_TABLET | Freq: Every evening | ORAL | Status: DC | PRN

## 2022-08-07 MED ORDER — CARVEDILOL 12.5 MG PO TABS
25.0000 mg | ORAL_TABLET | Freq: Two times a day (BID) | ORAL | Status: DC
  Administered 2022-08-07 – 2022-08-08 (×3): 25 mg via ORAL
  Filled 2022-08-07 (×3): qty 2

## 2022-08-07 MED ORDER — DEXAMETHASONE SODIUM PHOSPHATE 10 MG/ML IJ SOLN
10.0000 mg | Freq: Once | INTRAMUSCULAR | Status: AC
Start: 2022-08-07 — End: 2022-08-07
  Administered 2022-08-07: 10 mg via INTRAVENOUS
  Filled 2022-08-07: qty 1

## 2022-08-07 MED ORDER — LEVOTHYROXINE SODIUM 50 MCG PO TABS
50.0000 ug | ORAL_TABLET | Freq: Every day | ORAL | Status: DC
  Administered 2022-08-07 – 2022-08-08 (×2): 50 ug via ORAL
  Filled 2022-08-07 (×2): qty 1

## 2022-08-07 MED ORDER — POLYETHYLENE GLYCOL 3350 17 G PO PACK: 17.0000 g | PACK | Freq: Every day | ORAL | Status: DC | PRN

## 2022-08-07 MED ORDER — ONDANSETRON HCL 4 MG/2ML IJ SOLN
4.0000 mg | Freq: Once | INTRAMUSCULAR | Status: AC
Start: 2022-08-07 — End: 2022-08-07
  Administered 2022-08-07: 4 mg via INTRAVENOUS
  Filled 2022-08-07: qty 2

## 2022-08-07 MED ORDER — DULOXETINE HCL 20 MG PO CPEP
20.0000 mg | ORAL_CAPSULE | Freq: Every day | ORAL | Status: DC
  Administered 2022-08-07 – 2022-08-08 (×2): 20 mg via ORAL
  Filled 2022-08-07 (×3): qty 1

## 2022-08-07 MED ORDER — HYDRALAZINE HCL 50 MG PO TABS
100.0000 mg | ORAL_TABLET | Freq: Three times a day (TID) | ORAL | Status: DC
  Administered 2022-08-07 – 2022-08-08 (×4): 100 mg via ORAL
  Filled 2022-08-07 (×4): qty 2

## 2022-08-07 MED ORDER — LOPERAMIDE HCL 2 MG PO CAPS: 2.0000 mg | ORAL_CAPSULE | ORAL | Status: DC | PRN

## 2022-08-07 MED ORDER — HYDRALAZINE HCL 20 MG/ML IJ SOLN
5.0000 mg | Freq: Three times a day (TID) | INTRAMUSCULAR | Status: DC | PRN
Start: 2022-08-07 — End: 2022-08-08
  Administered 2022-08-07 (×2): 5 mg via INTRAVENOUS
  Filled 2022-08-07 (×4): qty 1

## 2022-08-07 MED ORDER — LABETALOL HCL 5 MG/ML IV SOLN
5.0000 mg | Freq: Once | INTRAVENOUS | Status: AC
  Administered 2022-08-07: 5 mg via INTRAVENOUS
  Filled 2022-08-07: qty 4

## 2022-08-07 MED ORDER — ORAL CARE MOUTH RINSE
15.0000 mL | OROMUCOSAL | Status: DC
  Administered 2022-08-07 – 2022-08-08 (×4): 15 mL via OROMUCOSAL

## 2022-08-07 MED ORDER — TECHNETIUM TO 99M ALBUMIN AGGREGATED
4.2500 | Freq: Once | INTRAVENOUS | Status: AC
  Administered 2022-08-07: 4.25 via INTRAVENOUS

## 2022-08-07 MED ORDER — PROCHLORPERAZINE EDISYLATE 10 MG/2ML IJ SOLN
10.0000 mg | Freq: Four times a day (QID) | INTRAMUSCULAR | Status: DC | PRN
Start: 2022-08-07 — End: 2022-08-08

## 2022-08-07 MED ORDER — FUROSEMIDE 10 MG/ML IJ SOLN
40.0000 mg | Freq: Once | INTRAMUSCULAR | Status: AC
Start: 2022-08-07 — End: 2022-08-07
  Administered 2022-08-07: 40 mg via INTRAVENOUS
  Filled 2022-08-07: qty 4

## 2022-08-07 MED ORDER — ROSUVASTATIN CALCIUM 20 MG PO TABS
20.0000 mg | ORAL_TABLET | Freq: Every day | ORAL | Status: DC
  Administered 2022-08-07 – 2022-08-08 (×2): 20 mg via ORAL
  Filled 2022-08-07 (×2): qty 1

## 2022-08-07 MED ORDER — LORAZEPAM 2 MG/ML IJ SOLN
0.5000 mg | Freq: Once | INTRAMUSCULAR | Status: AC
Start: 2022-08-07 — End: 2022-08-07
  Administered 2022-08-07: 0.5 mg via INTRAVENOUS
  Filled 2022-08-07: qty 1

## 2022-08-07 NOTE — Progress Notes (Signed)
Notified that troponin is elevated to 1095 in this patient who was admitted this am with SOB, found to have elevated d-dimer, and was planned for V/Q scan.   Plan to trend troponin, check echo, follow-up V/Q results

## 2022-08-07 NOTE — Progress Notes (Signed)
[place] # 4 TRACH ORDERED BY NURSE

## 2022-08-07 NOTE — Progress Notes (Signed)
CRITICAL VALUE STICKER  CRITICAL VALUE: Troponin 1217  RECEIVER (on-site recipient of call): Freda Jackson RN  DATE & TIME NOTIFIED: 08/07/2022 @ 0732  MESSENGER (representative from lab): Tammy  MD NOTIFIED: Dr. Pietro Cassis  TIME OF NOTIFICATION: 9507  RESPONSE:  Waiting for NM pulm perfusion test to be performed to see results first.

## 2022-08-07 NOTE — H&P (Addendum)
History and Physical  [name] OFB:[id number] DOB: 12-Aug-1946 DOA: 08/06/2022  Referring physician: Dr. Dayna Barker, EDP  PCP: Hoyt Koch, MD  Outpatient Specialists: ENT Patient coming from: Home  Chief Complaint: Shortness of breath.  HPI: [name] is a 76 y.o. female with medical history significant for head and neck cancer status post laryngectomy, dysphagia status post esophageal dilatation, hypothyroidism, hypertension, CKD 4, peripheral artery disease, prior CVA, COPD, paroxysmal A-fib on Eliquis, chronic anxiety/depression, who presented to [facility] ED with complaints of shortness of breath and intractable headache that began on the day of presentation.  Work-up revealed elevated D-dimer greater than 20 with hypoxia, VQ scan is pending.  CT head is nonacute.  CT chest without contrast is unrevealing.  Received bronchodilators, IV antihypertensives and headache cocktail in the ED.  TRH, hospitalist service, was asked to admit.  ED Course: Tmax 98.8.  BP 197/76, pulse 98, respiration rate 22, O2 saturation 94% on room air.  Lab studies remarkable for hemoglobin 8.9, MCV 86.  Serum bicarb 19, glucose 155, BUN 50, creatinine 3.40, close to baseline.  BNP 140.  Review of Systems: Review of systems as noted in the HPI. All other systems reviewed and are negative.   Past Medical History:  Diagnosis Date   Anemia    Anxiety    takes Ativan prn   Blood transfusion without reported diagnosis 09/15/12   2 units Prbc's   Broken ribs    Chronic back pain    CKD (chronic kidney disease), stage IV (HCC)    Constipation    related to pain meds   COPD (chronic obstructive pulmonary disease) ([place]) 08/10/2012   denies   Depression    Gastrostomy in place [facility])    removed   GERD (gastroesophageal reflux disease)    takes Zantac daily   Headache(784.0)    Hiatal hernia 08/10/2012   History of radiation therapy 10/17/12-11/25/12   supraglottic larynx,high risk neck tumor bed  5880 cGy/28 sessions, high risk lymph node tumor bed 5600 cGy/20 sessions, mod risk lymph node tumor bed 5040 cGy/20 sessions   Hypercholesteremia    takes Pravastatin daily   Hypertension    takes Tribenzor and Atenolol daily   Insomnia    takes Amitriptyline daily   Nausea    takes Zofran prn   PAD (peripheral artery disease) ([place])    noninvasive imaging in 2016   Pneumonia    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 08/08/2012   Shortness of breath dyspnea    Stroke [facility]) 2011   denies. no residual   Uterine cancer ([place])    Past Surgical History:  Procedure Laterality Date   ABDOMINAL SURGERY     r/t uterine carcinoma   APPENDECTOMY     DIRECT LARYNGOSCOPY N/A 05/22/2014   Procedure: DIRECT LARYNGOSCOPY WITH ESOPHAGEAL DILATION;  Surgeon: Jerrell Belfast, MD;  Location: [place];  Service: ENT;  Laterality: N/A;   ESOPHAGOSCOPY WITH DILITATION N/A 05/29/2015   Procedure: ESOPHAGOSCOPY WITH ESOPHAGEAL DILITATION;  Surgeon: Jerrell Belfast, MD;  Location: [place];  Service: ENT;  Laterality: N/A;   Gastrostomy Tube removed   2013   GASTROSTOMY W/ FEEDING TUBE  13   HERNIA REPAIR     child   LARYNGETOMY  08/31/2012   Procedure: LARYNGECTOMY;  Surgeon: Jerrell Belfast, MD;  Location: [place];  Service: ENT;  Laterality: N/A;   LARYNGOSCOPY  08/10/2012   Procedure: LARYNGOSCOPY;  Surgeon: Jerrell Belfast, MD;  Location: [facility];  Service:  ENT;  Laterality: N/A;  with biopsy   RADICAL NECK DISSECTION  08/31/2012   Procedure: RADICAL NECK DISSECTION;  Surgeon: Jerrell Belfast, MD;  Location: [place];  Service: ENT;  Laterality: Bilateral;   TRACHEAL ESOPHOGEAL PUNCTURE WITH REPAIR STOMA N/A 09/08/2013   Procedure: TRACHEAL ESOPHOGEAL PUNCTURE WITH PLACEMENT OF  PROVOX PROSTHESIS ;  Surgeon: Jerrell Belfast, MD;  Location: [place];  Service: ENT;  Laterality: N/A;   TRACHEOSTOMY TUBE PLACEMENT  08/10/2012   Procedure: TRACHEOSTOMY;  Surgeon: Jerrell Belfast, MD;  Location: [facility];  Service:  ENT;  Laterality: N/A;    Social History:  reports that she quit smoking about 9 years ago. Her smoking use included cigarettes. She has a 12.50 pack-year smoking history. She has never used smokeless tobacco. She reports current alcohol use. She reports that she does not use drugs.   Allergies  Allergen Reactions   Xyzal [Levocetirizine Dihydrochloride] Itching   Augmentin [Amoxicillin-Pot Clavulanate] Diarrhea and Nausea And Vomiting   Tribenzor [Olmesartan-Amlodipine-Hctz] Other (See Comments)    "Hurt the kidneys"    Family History  Problem Relation Age of Onset   Throat cancer Father    Brain cancer Brother    CAD Neg Hx       Prior to Admission medications   Medication Sig Start Date End Date Taking? Authorizing Provider  carvedilol (COREG) 25 MG tablet TAKE 1 TABLET (25 MG TOTAL) BY MOUTH TWICE A DAY WITH MEALS Patient taking differently: Take 25 mg by mouth 2 (two) times daily with a meal. 05/26/22  Yes Hoyt Koch, MD  LORazepam (ATIVAN) 0.5 MG tablet Take 1 tablet (0.5 mg total) by mouth 2 (two) times daily. 03/19/22  Yes Hoyt Koch, MD  metoprolol tartrate (LOPRESSOR) 50 MG tablet TAKE 1 TABLET BY MOUTH TWICE A DAY Patient taking differently: Take 50 mg by mouth 2 (two) times daily. 04/30/22  Yes Hoyt Koch, MD  amLODipine (NORVASC) 10 MG tablet Take 1 tablet (10 mg total) by mouth daily. 01/09/22   Little Ishikawa, MD  apixaban (ELIQUIS) 2.5 MG TABS tablet Take 1 tablet (2.5 mg total) by mouth 2 (two) times daily. 02/10/22   Hoyt Koch, MD  cilostazol (PLETAL) 100 MG tablet TAKE 1 TABLET BY MOUTH TWICE A DAY Patient taking differently: Take 100 mg by mouth 2 (two) times daily. 02/03/22   Hoyt Koch, MD  cilostazol (PLETAL) 100 MG tablet Take 100 mg by mouth 2 (two) times daily. 01/03/22   [provider]  docusate sodium (COLACE) 100 MG capsule Take 1 capsule (100 mg total) by mouth daily. 01/09/22   Little Ishikawa, MD  DULoxetine (CYMBALTA) 20 MG capsule TAKE 1 CAPSULE BY MOUTH EVERY DAY Patient taking differently: Take 20 mg by mouth daily. 07/23/22   Hoyt Koch, MD  Ensure (ENSURE) Take 237 mLs by mouth daily.    [provider]  fluticasone (FLONASE) 50 MCG/ACT nasal spray SPRAY 2 SPRAYS INTO EACH NOSTRIL EVERY DAY Patient taking differently: Place 2 sprays into both nostrils daily. 01/31/22   Janith Lima, MD  furosemide (LASIX) 20 MG tablet Take 1 tablet (20 mg total) by mouth daily as needed. 05/20/22   Hoyt Koch, MD  hydrALAZINE (APRESOLINE) 100 MG tablet Take 1 tablet (100 mg total) by mouth 3 (three) times daily. 03/03/22   Hoyt Koch, MD  isosorbide mononitrate (IMDUR) 60 MG 24 hr tablet TAKE 1 TABLET BY MOUTH EVERY DAY Patient taking differently:  Take 60 mg by mouth daily. 07/29/22   Hoyt Koch, MD  levothyroxine (SYNTHROID) 50 MCG tablet TAKE 1 TABLET BY MOUTH EVERY DAY BEFORE BREAKFAST Patient taking differently: Take 50 mcg by mouth daily before breakfast. 04/30/22   Hoyt Koch, MD  loperamide (IMODIUM A-D) 2 MG tablet Take 2 mg by mouth 4 (four) times daily as needed for diarrhea or loose stools.    [provider]  mirtazapine (REMERON) 15 MG tablet Take 15 mg by mouth at bedtime.    [provider]  ondansetron (ZOFRAN) 4 MG tablet TAKE 1 TABLET BY MOUTH EVERY 8 HOURS AS NEEDED FOR NAUSEA AND VOMITING Patient taking differently: Take 4 mg by mouth every 8 (eight) hours as needed for nausea or vomiting. 02/03/22   Hoyt Koch, MD  oxyCODONE (ROXICODONE) 5 MG/5ML solution Take by mouth. 07/16/22   [provider]  predniSONE (DELTASONE) 20 MG tablet Take 40 mg by mouth every morning. 11/26/21   [provider]  rosuvastatin (CRESTOR) 20 MG tablet TAKE 1 TABLET (20 MG TOTAL) BY MOUTH DAILY. 07/29/22   Hoyt Koch, MD  levocetirizine (XYZAL) 5 MG tablet TAKE 1 TABLET(5 MG)  BY MOUTH EVERY EVENING 02/21/20 06/10/20  Janith Lima, MD    Physical Exam: BP (!) 216/97   Pulse 96   Temp 98.7 F (37.1 C) (Oral)   Resp 18   SpO2 98%   General: 76 y.o. year-old female frail-appearing in no acute distress.  Alert and responsive. Cardiovascular: Regular rate and rhythm with no rubs or gallops.  Stoma noted from laryngectomy, no JVD noted.  No lower extremity edema.  Respiratory: Diffuse wheezing bilaterally.  Poor inspiratory effort. Abdomen: Soft nontender nondistended with normal bowel sounds x4 quadrants. Muskuloskeletal: No cyanosis, clubbing or edema noted bilaterally Neuro: CN II-XII intact, strength, sensation, reflexes Skin: No ulcerative lesions noted or rashes Psychiatry: Judgement and insight appear normal. Mood is appropriate for condition and setting          Labs on Admission:  Basic Metabolic Panel: Recent Labs  Lab 08/06/22 1723  NA 139  K 4.5  CL 110  CO2 19*  GLUCOSE 155*  BUN 50*  CREATININE 3.40*  CALCIUM 9.3   Liver Function Tests: No results for input(s): "AST", "ALT", "ALKPHOS", "BILITOT", "PROT", "ALBUMIN" in the last 168 hours. No results for input(s): "LIPASE", "AMYLASE" in the last 168 hours. No results for input(s): "AMMONIA" in the last 168 hours. CBC: Recent Labs  Lab 08/06/22 1723  WBC 6.5  NEUTROABS 5.2  HGB 8.9*  HCT 28.7*  MCV 86.2  PLT 247   Cardiac Enzymes: No results for input(s): "CKTOTAL", "CKMB", "CKMBINDEX", "TROPONINI" in the last 168 hours.  BNP (last 3 results) Recent Labs    01/22/22 1405 03/03/22 1700 08/06/22 2324  BNP 169.3* 128.1* 140.7*    ProBNP (last 3 results) No results for input(s): "PROBNP" in the last 8760 hours.  CBG: No results for input(s): "GLUCAP" in the last 168 hours.  Radiological Exams on Admission: CT Head Wo Contrast  Result Date: 08/07/2022 CLINICAL DATA:  Headache, new or worsening (Age >= 50y) EXAM: CT HEAD WITHOUT CONTRAST TECHNIQUE: Contiguous axial  images were obtained from the base of the skull through the vertex without intravenous contrast. RADIATION DOSE REDUCTION: This exam was performed according to the departmental dose-optimization program which includes automated exposure control, adjustment of the mA and/or kV according to patient size and/or use of iterative reconstruction technique. COMPARISON:  None Available. FINDINGS: Brain: Normal anatomic configuration. Parenchymal volume loss is commensurate with the patient's age. periventricular white matter changes are present likely reflecting the sequela of small vessel ischemia. No abnormal intra or extra-axial mass lesion or fluid collection. No abnormal mass effect or midline shift. No evidence of acute intracranial hemorrhage or infarct. Ventricular size is normal. Cerebellum unremarkable. Vascular: No asymmetric hyperdense vasculature at the skull base. Skull: Intact Sinuses/Orbits: Paranasal sinuses are clear. Orbits are unremarkable. Other: Mastoid air cells and middle ear cavities are clear. IMPRESSION: 1. No acute intracranial hemorrhage or infarct. 2. Mild senescent change. Electronically Signed   By: Fidela Salisbury M.D.   On: 08/07/2022 00:11   CT CHEST WO CONTRAST  Result Date: 08/06/2022 CLINICAL DATA:  Shortness of breath today. Pulmonary embolism suspected, high probability. EXAM: CT CHEST WITHOUT CONTRAST TECHNIQUE: Multidetector CT imaging of the chest was performed following the standard protocol without IV contrast. RADIATION DOSE REDUCTION: This exam was performed according to the departmental dose-optimization program which includes automated exposure control, adjustment of the mA and/or kV according to patient size and/or use of iterative reconstruction technique. COMPARISON:  Chest radiographs 08/06/2022 and 03/19/2022. Chest CT 01/22/2022 and 07/04/2021. FINDINGS: Cardiovascular: Atherosclerosis of the aorta, great vessels and coronary arteries. No acute vascular findings are  identified on noncontrast imaging. There is borderline central enlargement of the pulmonary arteries. The heart size is normal. There is no pericardial effusion. Mediastinum/Nodes: There are no enlarged mediastinal, hilar or axillary lymph nodes.Hilar assessment is limited by the lack of intravenous contrast, although the hilar contours appear unchanged. Small mediastinal lymph nodes are stable, likely reactive. Tracheostomy in place. Small hiatal hernia. Lungs/Pleura: No pleural effusion or pneumothorax. There is chronic scarring at both lung apices and diffuse central airway thickening. Subpleural left upper lobe nodule measuring 6 mm on image 15/5 is stable. 5 mm right middle lobe nodule on image 72/5 appears minimally larger (4 mm previously). Upper abdomen: The visualized upper abdomen appears stable. There are numerous simple and mildly complex cystic renal lesions bilaterally which are incompletely characterized without contrast, but grossly stable from previous study. These have been previously evaluated by MRI 01/16/2021 at which time one of the lesions in the interpolar region of the left kidney with considered indeterminate and MR follow-up was recommended. Musculoskeletal/Chest wall: There is no chest wall mass or suspicious osseous finding. Old left-sided rib fractures. IMPRESSION: 1. No acute chest findings or explanation for the patient's symptoms on noncontrast imaging. Vascular assessment limited without contrast. Cannot evaluate for pulmonary thromboembolic disease. 2. Tracheostomy tube remains in place. 3. Stable scattered small mediastinal lymph nodes, likely reactive. 4. Similar appearance of small pulmonary nodules bilaterally, likely benign. Stable biapical scarring and diffuse central airway thickening. 5. Coronary and Aortic Atherosclerosis (ICD10-I70.0). 6. Partially imaged renal lesions are grossly stable but suboptimally evaluated by CT. MR follow-up of an indeterminate lesion in the  interpolar region of the left kidney should be considered per MRI recommendations of 01/16/2021 (if not previously performed elsewhere). Electronically Signed   By: Richardean Sale M.D.   On: 08/06/2022 19:12   DG Chest 2 View  Result Date: 08/06/2022 CLINICAL DATA:  Short of breath EXAM: CHEST - 2 VIEW COMPARISON:  Chest two-view 03/19/2022 FINDINGS: The heart size and mediastinal contours are within normal limits. Atherosclerotic aorta. Both lungs are clear. The visualized skeletal structures are unremarkable. IMPRESSION: No active cardiopulmonary disease. Electronically Signed   By: Franchot Gallo M.D.   On: 08/06/2022 17:59  EKG: I independently viewed the EKG done and my findings are as followed: Sinus tachycardia rate of 103.  Nonspecific ST-T changes.  QTc 491.  Assessment/Plan Present on Admission:  Dyspnea  Principal Problem:   Dyspnea  Dyspnea, unclear etiology, rule out pulmonary embolism. D-dimer greater than 20 The patient is on Eliquis prior to admission for paroxysmal A-fib Not hypoxic, on ambient air, with O2 saturation of 94%. VQ scan ordered by EDP, pending.  Follow-up VQ scan. CT chest without contrast was unrevealing. Received 1 dose of IV Decadron 10 mg x 1 for diffuse wheezing and 1 dose of IV Lasix 40 mg x 1 in the ED Maintain O2 saturation above 92%, continue bronchodilators. Troponin pending at the time of the dictation.  Uncontrolled hypertension Resume home oral antihypertensives IV hydralazine as needed with parameters Continue to closely monitor vital signs  Intractable headache CT head nonacute Received headache cocktail in the ED Analgesics as needed  Non anion gap metabolic acidosis Serum bicarb 19, anion gap 10 Repeat renal panel and trend serum bicarb If acidosis worsens, obtain pH on blood gas and treat as indicated.  Hyperglycemia with history of prediabetes Diet controlled Hemoglobin A1c 5.2 on 12/27/2020. Add on hemoglobin  A1c  Paroxysmal A-fib on Eliquis Home Eliquis resumed in the ED Currently in sinus rhythm and rate controlled. Continue home beta-blocker Continue to monitor on telemetry  CKD 4 Close to baseline renal function Presented with creatinine of 3.4 with GFR of 13 Avoid nephrotoxic agents Monitor urine output with strict I's and O's Repeat renal panel in the morning.  Chronic diastolic CHF Euvolemic on exam Resume home cardiac medications Start strict I's and O's and daily weight  Hypothyroidism Resume home levothyroxine  Hyperlipidemia Resume home Crestor  Chronic anxiety/depression Resume home Cymbalta   Critical care time: 65 minutes    DVT prophylaxis: Eliquis  Code Status: Full code  Family Communication: None at bedside  Disposition Plan: Admitted to stepdown unit  Consults called: None.  Admission status: Inpatient status.   Status is: Inpatient The patient requires at least 2 midnights for further evaluation and treatment of present condition.   Kayleen Memos MD [facility] Pager [phone]  If 7PM-7AM, please contact night-coverage [email or web address] Password [facility]  08/07/2022, 3:16 AM

## 2022-08-07 NOTE — Progress Notes (Signed)
PROGRESS NOTE  [name]  DOB: 21-Dec-1945  PCP: Hoyt Koch, MD FUX:[id number]  DOA: 08/06/2022  LOS: 0 days  Hospital Day: 2  Brief narrative: [name] is a 76 y.o. female with PMH significant for head and neck cancer status post laryngectomy, dysphagia status post esophageal dilatation, HTN, HLD, PAD, paroxysmal A-fib, prior CVA, CKD, chronic anemia, COPD, GERD, hypothyroidism, chronic back pain, constipation, anxiety/depression.  Patient was brought to the ED from home on 8/24 with complaint of shortness of breath developing over few days.  Per patient's son, they had trees cut down recently in the neighborhood and thinks that is something to do with her symptoms.  In the ED, patient was afebrile, heart rate 107, respiratory rate in 20s, blood pressure 153/89, breathing on room air Her tracheostomy site was noted to be intact.  She did not have any trouble swallowing or getting air in.  She had faint wheezing on exam with some relief with albuterol. Labs showed BUN/creatinine elevated to 50/3.4, hemoglobin 8.9, WBC count normal. D-dimer was elevated to over 20 COVID PCR negative CT scan of head did not show any acute infarct or hemorrhage CT chest without contrast  did not show any acute findings.  Contrast could not be used because of elevated creatinine.  Tracheostomy tube was in place.  She had reactive small mediastinal lymph nodes and likely benign bilateral small pulmonary nodules. Patient was admitted to hospitalist service  After admission, troponin resulted showing an elevated level of 1095 and subsequently to 1217.   Subjective: Patient was seen and examined this morning.  Pleasant elderly Caucasian female.  Lying on bed.  Has tracheostomy opening without a tube.  She is oxygen mask in front of the neck.  No family at bedside. Patient is able to nod and move lips for conversation.  Able to follow commands.  Family not at bedside. No complaint of  chest pain. Chart reviewed. Remains afebrile, heart rate in 90s It seems that patient's blood pressure overnight consistently was elevated to over 200. Labs this morning with creatinine improving to 48/3.2  Assessment and plan: Acute onset dyspnea History of COPD Rule out pulmonary embolism Presented from home with shortness of breath developing a few days  Not requiring supplemental oxygen at rest  CT chest did not show any acute infiltrates  D-dimer greater than 20, troponin elevated Need to rule out pulm embolism as well as cardiac etiology. VQ scan this afternoon negative for any evidence of pulm embolism In the ED, patient was given 1 dose of IV Decadron for diffuse wheezing as well as 1 dose of IV Lasix. Currently continued on bronchodilators. Not on antibiotics or IV steroids.  Elevated troponin Patient with shortness of breath.  No..   It could be due to primary cardiac event or due to significantly elevated blood pressure. Has significant risk factors for coronary artery disease.  However she does not have any active cardiac symptoms at this time. 8/25, echocardiogram did not show any wall motion abnormality.   This afternoon, I called and discussed with cardiologist Dr. Debara Pickett.  On chart review it was noted that patient had elevated troponin during her evaluation in 2022 as well as January 2023.  Troponin at that time was as high as 1500.  She was recommended for outpatient follow-up and evaluation but it apparently did not happen. Recent Labs    08/07/22 0506 08/07/22 0626  TROPONINIHS 1,095* 1,217*   Intractable headache Probably due to significantly elevated  blood pressure CT head nonacute Received headache cocktail in the ED Analgesics as needed  Uncontrolled hypertension Chronic diastolic CHF Blood pressure elevated over 200 and multiple readings overnight.   Euvolemic on exam PTA on carvedilol 25 mg twice daily, Imdur 60 mg daily, hydralazine 100 mg 3 times  daily Lasix 20 mg daily as needed Resumed all. IV hydralazine as needed with parameters Continue to closely monitor vital signs   CKD 4 Chronic metabolic acidosis Creatinine between 3 and 3.4 at baseline.  Currently remains at baseline Recent Labs    01/03/22 0740 01/04/22 0228 01/05/22 0147 01/06/22 0219 01/07/22 0116 01/08/22 0125 01/22/22 1404 03/03/22 1700 08/06/22 1723 08/07/22 0506  BUN 54* 47* 62* 58* 63* 62* 33* 45* 50* 48*  CREATININE 2.68* 2.62* 3.44* 3.16* 3.45* 3.43* 2.68* 3.28* 3.40* 3.20*  CO2 21* 19* 21* 18* 20* 20* 19* 21* 19* 20*     Paroxysmal A-fib on Eliquis PTA on metoprolol and Eliquis. Both have not resumed   Hypothyroidism Continue levothyroxine   Peripheral artery disease Hyperlipidemia Continue Eliquis, cilostazol, Crestor   Chronic anxiety/depression PTA on Ativan 0.5 mg twice daily, Cymbalta, Remeron 15 mg at bedtime  Left kidney lesion CT scan on admission showed a left renal lesion stable since 01/2021.  Radiologist recommended MR follow-up as an outpatient.  Goals of care   Code Status: Full Code    Mobility: Encourage ambulation.  PT eval.  Skin assessment:     Nutritional status:  There is no height or weight on file to calculate BMI.          Diet:  Diet Order             Diet regular Room service appropriate? Yes; Fluid consistency: Thin  Diet effective now                   DVT prophylaxis:  apixaban (ELIQUIS) tablet 2.5 mg Start: 08/07/22 0200 apixaban (ELIQUIS) tablet 2.5 mg   Antimicrobials: None Fluid: None Consultants: None at this time Family Communication: Called and updated patient's son this afternoon  Status is: Inpatient  Continue in-hospital care because: Significantly abnormal labs, continued work-up Level of care: Stepdown   Dispo: The patient is from: Home              Anticipated d/c is to: Pending clinical course              Patient currently is not medically stable to d/c.    Difficult to place patient No     Infusions:    Scheduled Meds:  amLODipine  10 mg Oral Daily   apixaban  2.5 mg Oral BID   carvedilol  25 mg Oral BID WC   Chlorhexidine Gluconate Cloth  6 each Topical Daily   cilostazol  100 mg Oral BID   DULoxetine  20 mg Oral Daily   hydrALAZINE  100 mg Oral TID   ipratropium-albuterol  3 mL Nebulization Q6H   isosorbide mononitrate  60 mg Oral Daily   levothyroxine  50 mcg Oral Q0600   LORazepam  0.5 mg Oral BID   mirtazapine  15 mg Oral QHS   mupirocin ointment  1 Application Nasal BID   mouth rinse  15 mL Mouth Rinse 4 times per day   rosuvastatin  20 mg Oral Daily    PRN meds: acetaminophen, albuterol, hydrALAZINE, loperamide, melatonin, mouth rinse, polyethylene glycol, prochlorperazine   Antimicrobials: Anti-infectives (From admission, onward)    None  Objective: Vitals:   08/07/22 1508 08/07/22 1600  BP: (!) 217/71 (!) 153/56  Pulse: 91 92  Resp: (!) 25 17  Temp:    SpO2: 99% 96%   No intake or output data in the 24 hours ending 08/07/22 1643 There were no vitals filed for this visit. Weight change:  There is no height or weight on file to calculate BMI.   Physical Exam: General exam: Pleasant, not in distress Skin: No rashes, lesions or ulcers. HEENT: Has a tracheostomy opening and anterior neck without the tube.  Oxygen mask in front of it. Lungs: Clear to auscultation bilaterally CVS: Regular rate and rhythm, no murmur GI/Abd soft, nontender, nondistended, bowel sound present CNS: Alert, awake, oriented x3, able to follow commands.  Nods head and moves her lip to answer questions Psychiatry: Sad affect Extremities: No pedal edema, no calf tenderness  Data Review: I have personally reviewed the laboratory data and studies available.  F/u labs ordered Unresulted Labs (From admission, onward)     Start     Ordered   08/08/22 0500  CBC with Differential/Platelet  Tomorrow morning,   R       Question:   Specimen collection method  Answer:  Lab=Lab collect   08/07/22 1423   08/08/22 1191  Basic metabolic panel  Tomorrow morning,   R       Question:  Specimen collection method  Answer:  Lab=Lab collect   08/07/22 1423            Signed, Terrilee Croak, MD [facility] 08/07/2022

## 2022-08-08 DIAGNOSIS — R0600 Dyspnea, unspecified: Secondary | ICD-10-CM | POA: Diagnosis not present

## 2022-08-08 LAB — CBC WITH DIFFERENTIAL/PLATELET
Abs Immature Granulocytes: 0.05 10*3/uL (ref 0.00–0.07)
Basophils Absolute: 0 10*3/uL (ref 0.0–0.1)
Basophils Relative: 0 %
Eosinophils Absolute: 0 10*3/uL (ref 0.0–0.5)
Eosinophils Relative: 0 %
HCT: 25.7 % — ABNORMAL LOW (ref 36.0–46.0)
Hemoglobin: 8.1 g/dL — ABNORMAL LOW (ref 12.0–15.0)
Immature Granulocytes: 1 %
Lymphocytes Relative: 8 %
Lymphs Abs: 0.8 10*3/uL (ref 0.7–4.0)
MCH: 27.1 pg (ref 26.0–34.0)
MCHC: 31.5 g/dL (ref 30.0–36.0)
MCV: 86 fL (ref 80.0–100.0)
Monocytes Absolute: 0.6 10*3/uL (ref 0.1–1.0)
Monocytes Relative: 7 %
Neutro Abs: 7.9 10*3/uL — ABNORMAL HIGH (ref 1.7–7.7)
Neutrophils Relative %: 84 %
Platelets: 221 10*3/uL (ref 150–400)
RBC: 2.99 MIL/uL — ABNORMAL LOW (ref 3.87–5.11)
RDW: 15.3 % (ref 11.5–15.5)
WBC: 9.4 10*3/uL (ref 4.0–10.5)
nRBC: 0 % (ref 0.0–0.2)

## 2022-08-08 LAB — BASIC METABOLIC PANEL
Anion gap: 8 (ref 5–15)
BUN: 55 mg/dL — ABNORMAL HIGH (ref 8–23)
CO2: 23 mmol/L (ref 22–32)
Calcium: 9.1 mg/dL (ref 8.9–10.3)
Chloride: 107 mmol/L (ref 98–111)
Creatinine, Ser: 3.63 mg/dL — ABNORMAL HIGH (ref 0.44–1.00)
GFR, Estimated: 12 mL/min — ABNORMAL LOW (ref >60)
Glucose, Bld: 121 mg/dL — ABNORMAL HIGH (ref 70–99)
Potassium: 4.5 mmol/L (ref 3.5–5.1)
Sodium: 138 mmol/L (ref 135–145)

## 2022-08-08 LAB — TROPONIN I (HIGH SENSITIVITY): Troponin I (High Sensitivity): 870 ng/L (ref <18)

## 2022-08-08 MED ORDER — LORAZEPAM 0.5 MG PO TABS: 0.5000 mg | ORAL_TABLET | Freq: Two times a day (BID) | ORAL | 0 refills | Status: DC | PRN

## 2022-08-08 NOTE — Progress Notes (Addendum)
SATURATION QUALIFICATIONS: (This note is used to comply with regulatory documentation for home oxygen)  Patient Saturations on Room Air at Rest = 92 %  Patient Saturations on Room Air while Ambulating = 94%  Patient Saturations on ** Liters of oxygen while Ambulating = n/a  Please briefly explain why patient needs home oxygen:

## 2022-08-08 NOTE — Discharge Summary (Signed)
Physician Discharge Summary  [name] OHY:[id number] DOB: November 24, 1946 DOA: 08/06/2022  PCP: Hoyt Koch, MD  Admit date: 08/06/2022 Discharge date: 08/08/2022  Admitted From: Home Discharge disposition: Home   Brief narrative: [name] is a 76 y.o. female with PMH significant for head and neck cancer status post laryngectomy, dysphagia status post esophageal dilatation, HTN, HLD, PAD, paroxysmal A-fib, prior CVA, CKD, chronic anemia, COPD, GERD, hypothyroidism, chronic back pain, constipation, anxiety/depression.  Patient was brought to the ED from home on 8/24 with complaint of shortness of breath developing over few days.  Per patient's son, they had trees cut down recently in the neighborhood and thinks that is something to do with her symptoms.  In the ED, patient was afebrile, heart rate 107, respiratory rate in 20s, blood pressure 153/89, breathing on room air Her tracheostomy site was noted to be intact.  She did not have any trouble swallowing or getting air in.  She had faint wheezing on exam with some relief with albuterol. Labs showed BUN/creatinine elevated to 50/3.4, hemoglobin 8.9, WBC count normal. D-dimer was elevated to over 20 COVID PCR negative CT scan of head did not show any acute infarct or hemorrhage CT chest without contrast  did not show any acute findings.  Contrast could not be used because of elevated creatinine.  Tracheostomy tube was in place.  She had reactive small mediastinal lymph nodes and likely benign bilateral small pulmonary nodules. Patient was admitted to hospitalist service  After admission, troponin resulted showing an elevated level of 1095 and subsequently to 1217.   Subjective: Patient was seen and examined this morning.   Lying on bed.  Not in distress.  Feels better.  Blood pressure improved after a.m. dose of medicines today.   Per RN, patient was able to walk in the hallway without supplemental oxygen.  Back to  baseline. Discussed with son on the phone.  Okay to discharge today.  Hospital course: Acute onset dyspnea History of COPD Rule out pulmonary embolism Presented from home with shortness of breath developing a few days  Not requiring supplemental oxygen at rest  CT chest did not show any acute infiltrates  D-dimer greater than 20, troponin elevated VQ scan negative for any evidence of pulm embolism Echocardiogram negative for any wall motion abnormality, EF more than 75%, no change compared to echo from few months ago. In the ED, patient was given 1 dose of IV Decadron for diffuse wheezing as well as 1 dose of IV Lasix. Currently continued on bronchodilators.  Did not require antibiotics or IV steroids.  Elevated troponin Patient presented with mild shortness of breath, no chest pain. Troponin was significantly elevated to 1200. Has significant risk factors for coronary artery disease.  However she does not have any active cardiac symptoms at this time. 8/25, echocardiogram did not show any wall motion abnormality.   8/25, I called and discussed with cardiologist Dr. Debara Pickett.  On chart review it was noted that patient had elevated troponin during her evaluation in 2022 as well as January 2023.  Troponin at that time was as high as 1500.  She was recommended for outpatient follow-up and evaluation but it apparently did not happen.  Repeat troponin this morning is lower at 870. Recent Labs    08/07/22 0506 08/07/22 0626 08/08/22 0318  TROPONINIHS 1,095* 1,217* 870*   Intractable headache Probably due to significantly elevated blood pressure CT head nonacute Received headache cocktail in the ED Continue Tylenol as needed  at home.  Uncontrolled hypertension Chronic diastolic CHF Blood pressure elevated over 200 on multiple readings in last 2 days.  Improving since meds were resumed yesterday. Euvolemic on exam PTA on carvedilol 25 mg twice daily, Imdur 60 mg daily, hydralazine 100 mg 3  times daily Lasix 20 mg daily as needed Continue all at home. Her son is concerned that patient might have been missing her blood pressure medicines.  CKD 4 Chronic metabolic acidosis Creatinine between 3 and 3.4 at baseline.  Currently remains at baseline Recent Labs    01/04/22 0228 01/05/22 0147 01/06/22 0219 01/07/22 0116 01/08/22 0125 01/22/22 1404 03/03/22 1700 08/06/22 1723 08/07/22 0506 08/08/22 0318  BUN 47* 62* 58* 63* 62* 33* 45* 50* 48* 55*  CREATININE 2.62* 3.44* 3.16* 3.45* 3.43* 2.68* 3.28* 3.40* 3.20* 3.63*  CO2 19* 21* 18* 20* 20* 19* 21* 19* 20* 23     Paroxysmal A-fib on Eliquis PTA on metoprolol and Eliquis. Both have not resumed   Hypothyroidism Continue levothyroxine   Peripheral artery disease Hyperlipidemia Continue Eliquis, cilostazol, Crestor   Chronic anxiety/depression PTA on Ativan 0.5 mg twice daily, Cymbalta, Remeron 15 mg at bedtime.  At the request of son, I refilled her Ativan for 5 days.  Left kidney lesion CT scan on admission showed a left renal lesion stable since 01/2021.  Radiologist recommended MR follow-up as an outpatient.  Wounds:  - Incision 08/10/12 Neck  (Active)  Date First Assessed/Time First Assessed: 08/10/12 1526   Location: Neck  Location Orientation: (c)     Assessments 08/10/2012  4:45 PM 09/13/2012  8:08 AM  Dressing Type Gauze (Comment) None  Site / Wound Assessment Bleeding Clean;Dry  Margins -- Attached edges (approximated)  Closure -- Approximated  Drainage Amount Minimal None  Drainage Description Sanguineous --  Treatment Cleansed --     No associated orders.     Incision 08/31/12 Neck Other (Comment) (Active)  Date First Assessed/Time First Assessed: 08/31/12 1550   Location: Neck  Location Orientation: Other (Comment)    Assessments 08/31/2012  4:15 PM 09/14/2012  8:00 AM  Dressing Type -- None  Dressing -- Clean;Dry;Intact  Site / Wound Assessment Clean Clean;Dry  Margins -- Attached edges  (approximated)  Closure Staples None  Drainage Amount None None  Treatment -- Cleansed     No associated orders.     Incision 09/08/13 N/A Other (Comment) (Active)  Date First Assessed/Time First Assessed: 09/08/13 0800   Location: N/A  Location Orientation: Other (Comment)    Assessments 09/08/2013  8:30 AM 09/08/2013  9:30 AM  Dressing Type None None     No associated orders.    Discharge Exam:   Vitals:   08/08/22 0829 08/08/22 0900 08/08/22 1000 08/08/22 1220  BP:  (!) 258/86 (!) 133/38   Pulse:  95 80   Resp:  (!) 23 16   Temp: 98.2 F (36.8 C)   98.2 F (36.8 C)  TempSrc: Oral   Oral  SpO2:  97% 97%     There is no height or weight on file to calculate BMI.   General exam: Pleasant, not in distress Skin: No rashes, lesions or ulcers. HEENT: Has a tracheostomy opening and anterior neck without the tube.  Lungs: Clear to auscultation bilaterally CVS: Regular rate and rhythm, no murmur GI/Abd soft, nontender, nondistended, bowel sound present CNS: Alert, awake, oriented x3, able to follow commands.  Nods head and moves her lip to answer questions Psychiatry: Mood appropriate Extremities: No pedal edema,  no calf tenderness  Follow ups:    Follow-up Information     Hoyt Koch, MD Follow up.   Specialty: Internal Medicine Contact information: [place] [address] [phone]         Nahser, Wonda Cheng, MD .   Specialty: Cardiology Contact information: [place] [address] [address] [phone]                 Discharge Instructions:   Discharge Instructions     Call MD for:  difficulty breathing, headache or visual disturbances   Complete by: As directed    Call MD for:  extreme fatigue   Complete by: As directed    Call MD for:  hives   Complete by: As directed    Call MD for:  persistant dizziness or light-headedness   Complete by: As directed    Call MD for:  persistant nausea and  vomiting   Complete by: As directed    Call MD for:  severe uncontrolled pain   Complete by: As directed    Call MD for:  temperature >100.4   Complete by: As directed    Diet general   Complete by: As directed    Discharge instructions   Complete by: As directed    General discharge instructions: Follow with Primary MD Hoyt Koch, MD in 7 days  Please request your PCP  to go over your hospital tests, procedures, radiology results at the follow up. Please get your medicines reviewed and adjusted.  Your PCP may decide to repeat certain labs or tests as needed. Do not drive, operate heavy machinery, perform activities at heights, swimming or participation in water activities or provide baby sitting services if your were admitted for syncope or siezures until you have seen by Primary MD or a Neurologist and advised to do so again. [place] Controlled Substance Reporting System database was reviewed. Do not drive, operate heavy machinery, perform activities at heights, swim, participate in water activities or provide baby-sitting services while on medications for pain, sleep and mood until your outpatient physician has reevaluated you and advised to do so again.  You are strongly recommended to comply with the dose, frequency and duration of prescribed medications. Activity: As tolerated with Full fall precautions use walker/cane & assistance as needed Avoid using any recreational substances like cigarette, tobacco, alcohol, or non-prescribed drug. If you experience worsening of your admission symptoms, develop shortness of breath, life threatening emergency, suicidal or homicidal thoughts you must seek medical attention immediately by calling 911 or calling your MD immediately  if symptoms less severe. You must read complete instructions/literature along with all the possible adverse reactions/side effects for all the medicines you take and that have been prescribed to you. Take any  new medicine only after you have completely understood and accepted all the possible adverse reactions/side effects.  Wear Seat belts while driving. You were cared for by a hospitalist during your hospital stay. If you have any questions about your discharge medications or the care you received while you were in the hospital after you are discharged, you can call the unit and ask to speak with the hospitalist or the covering physician. Once you are discharged, your primary care physician will handle any further medical issues. Please note that NO REFILLS for any discharge medications will be authorized once you are discharged, as it is imperative that you return to your primary care physician (or establish a  relationship with a primary care physician if you do not have one).   Increase activity slowly   Complete by: As directed        Discharge Medications:   Allergies as of 08/08/2022       Reactions   Xyzal [levocetirizine Dihydrochloride] Itching   Augmentin [amoxicillin-pot Clavulanate] Diarrhea, Nausea And Vomiting   Tribenzor [olmesartan-amlodipine-hctz] Other (See Comments)   "Hurt the kidneys"        Medication List     STOP taking these medications    metoprolol tartrate 50 MG tablet Commonly known as: LOPRESSOR       TAKE these medications    acetaminophen 500 MG tablet Commonly known as: TYLENOL Take 1,000 mg by mouth as needed for headache.   amLODipine 10 MG tablet Commonly known as: NORVASC Take 1 tablet (10 mg total) by mouth daily.   apixaban 2.5 MG Tabs tablet Commonly known as: ELIQUIS Take 1 tablet (2.5 mg total) by mouth 2 (two) times daily.   carvedilol 25 MG tablet Commonly known as: COREG TAKE 1 TABLET (25 MG TOTAL) BY MOUTH TWICE A DAY WITH MEALS What changed: See the new instructions.   cilostazol 100 MG tablet Commonly known as: PLETAL TAKE 1 TABLET BY MOUTH TWICE A DAY   docusate sodium 100 MG capsule Commonly known as: COLACE Take 1  capsule (100 mg total) by mouth daily.   DULoxetine 20 MG capsule Commonly known as: CYMBALTA TAKE 1 CAPSULE BY MOUTH EVERY DAY What changed: how much to take   Ensure Take 237 mLs by mouth daily.   fluticasone 50 MCG/ACT nasal spray Commonly known as: FLONASE SPRAY 2 SPRAYS INTO EACH NOSTRIL EVERY DAY What changed: See the new instructions.   furosemide 20 MG tablet Commonly known as: LASIX Take 1 tablet (20 mg total) by mouth daily as needed.   hydrALAZINE 100 MG tablet Commonly known as: APRESOLINE Take 1 tablet (100 mg total) by mouth 3 (three) times daily.   isosorbide mononitrate 60 MG 24 hr tablet Commonly known as: IMDUR TAKE 1 TABLET BY MOUTH EVERY DAY   levothyroxine 50 MCG tablet Commonly known as: SYNTHROID TAKE 1 TABLET BY MOUTH EVERY DAY BEFORE BREAKFAST What changed: See the new instructions.   loperamide 2 MG tablet Commonly known as: IMODIUM A-D Take 2 mg by mouth as needed for diarrhea or loose stools.   LORazepam 0.5 MG tablet Commonly known as: ATIVAN Take 1 tablet (0.5 mg total) by mouth 2 (two) times daily as needed for up to 7 days for anxiety. What changed:  when to take this reasons to take this   mirtazapine 15 MG tablet Commonly known as: REMERON Take 15 mg by mouth at bedtime.   ondansetron 4 MG tablet Commonly known as: ZOFRAN TAKE 1 TABLET BY MOUTH EVERY 8 HOURS AS NEEDED FOR NAUSEA AND VOMITING What changed: See the new instructions.   oxyCODONE 5 MG/5ML solution Commonly known as: ROXICODONE Take by mouth.   predniSONE 20 MG tablet Commonly known as: DELTASONE Take 40 mg by mouth every morning.   rosuvastatin 20 MG tablet Commonly known as: CRESTOR TAKE 1 TABLET (20 MG TOTAL) BY MOUTH DAILY. What changed: when to take this         The results of significant diagnostics from this hospitalization (including imaging, microbiology, ancillary and laboratory) are listed below for reference.    Procedures and Diagnostic  Studies:   NM Pulmonary Perfusion  Result Date: 08/07/2022 CLINICAL DATA:  Shortness  of breath.  Elevated D-dimer. EXAM: NUCLEAR MEDICINE PERFUSION LUNG SCAN TECHNIQUE: Perfusion images were obtained in multiple projections after intravenous injection of radiopharmaceutical. Ventilation scans intentionally deferred if perfusion scan and chest x-ray adequate for interpretation during COVID 19 epidemic. RADIOPHARMACEUTICALS:  4.25 mCi Tc-22mMAA IV COMPARISON:  Chest CT dated 08/06/2022 and chest radiographs dated 08/06/2022. FINDINGS: Normal perfusion of both lungs with no perfusion defects seen. IMPRESSION: Normal examination.  No evidence of pulmonary embolism. Electronically Signed   By: SClaudie ReveringM.D.   On: 08/07/2022 15:13   ECHOCARDIOGRAM COMPLETE  Result Date: 08/07/2022    ECHOCARDIOGRAM REPORT   Patient Name:   MARIENNA BENEGASDate of Exam: 08/07/2022 Medical Rec #:  [id number]       Height:       63.0 in Accession #:    [id number]      Weight:       122.4 lb Date of Birth:  [birth year]      BSA:          1.569 m Patient Age:    89years         BP:           183/93 mmHg Patient Gender: F                HR:           93 bpm. Exam Location:  Inpatient Procedure: 2D Echo, Cardiac Doppler and Color Doppler Indications:    Elevated troponin  History:        Patient has prior history of Echocardiogram examinations, most                 recent 01/02/2022. COPD; Risk Factors:Hypertension and                 Dyslipidemia.  Sonographer:    CJefferey PicaReferring Phys: [id number] 1. Left ventricular ejection fraction, by estimation, is >75%. The left ventricle has hyperdynamic function. The left ventricle has no regional wall motion abnormalities. There is moderate concentric left ventricular hypertrophy. Left ventricular diastolic parameters are consistent with Grade I diastolic dysfunction (impaired relaxation).  2. Right ventricular systolic function is normal. The right  ventricular size is normal. There is normal pulmonary artery systolic pressure.  3. Left atrial size was moderately dilated.  4. The mitral valve is normal in structure. No evidence of mitral valve regurgitation. No evidence of mitral stenosis.  5. The aortic valve is tricuspid. Aortic valve regurgitation is not visualized. No aortic stenosis is present.  6. The inferior vena cava is normal in size with <50% respiratory variability, suggesting right atrial pressure of 8 mmHg. FINDINGS  Left Ventricle: Left ventricular ejection fraction, by estimation, is >75%. The left ventricle has hyperdynamic function. The left ventricle has no regional wall motion abnormalities. The left ventricular internal cavity size was normal in size. There is moderate concentric left ventricular hypertrophy. Left ventricular diastolic parameters are consistent with Grade I diastolic dysfunction (impaired relaxation). Indeterminate filling pressures. Right Ventricle: The right ventricular size is normal. No increase in right ventricular wall thickness. Right ventricular systolic function is normal. There is normal pulmonary artery systolic pressure. The tricuspid regurgitant velocity is 2.21 m/s, and  with an assumed right atrial pressure of 8 mmHg, the estimated right ventricular systolic pressure is 220.9mmHg. Left Atrium: Left atrial size was moderately dilated. Right Atrium: Right atrial size was normal in size. Pericardium: There is  no evidence of pericardial effusion. Mitral Valve: The mitral valve is normal in structure. Mild mitral annular calcification. No evidence of mitral valve regurgitation. No evidence of mitral valve stenosis. Tricuspid Valve: The tricuspid valve is normal in structure. Tricuspid valve regurgitation is trivial. No evidence of tricuspid stenosis. Aortic Valve: The aortic valve is tricuspid. Aortic valve regurgitation is not visualized. No aortic stenosis is present. Aortic valve peak gradient measures 8.8  mmHg. Pulmonic Valve: The pulmonic valve was normal in structure. Pulmonic valve regurgitation is not visualized. No evidence of pulmonic stenosis. Aorta: The aortic root is normal in size and structure. Venous: The inferior vena cava is normal in size with less than 50% respiratory variability, suggesting right atrial pressure of 8 mmHg. IAS/Shunts: No atrial level shunt detected by color flow Doppler.  LEFT VENTRICLE PLAX 2D LVIDd:         3.75 cm   Diastology LVIDs:         2.00 cm   LV e' lateral:   4.60 cm/s LV PW:         1.35 cm   LV E/e' lateral: 13.4 LV IVS:        1.10 cm LVOT diam:     2.00 cm LV SV:         53 LV SV Index:   34 LVOT Area:     3.14 cm  RIGHT VENTRICLE             IVC RV Basal diam:  2.80 cm     IVC diam: 1.60 cm RV S prime:     16.80 cm/s TAPSE (M-mode): 2.5 cm LEFT ATRIUM             Index        RIGHT ATRIUM           Index LA diam:        2.80 cm 1.78 cm/m   RA Area:     12.50 cm LA Vol (A2C):   46.7 ml 29.76 ml/m  RA Volume:   30.20 ml  19.24 ml/m LA Vol (A4C):   59.7 ml 38.04 ml/m LA Biplane Vol: 55.6 ml 35.43 ml/m  AORTIC VALVE                 PULMONIC VALVE AV Area (Vmax): 2.14 cm     PV Vmax:       0.86 m/s AV Vmax:        148.00 cm/s  PV Peak grad:  3.0 mmHg AV Peak Grad:   8.8 mmHg LVOT Vmax:      101.00 cm/s LVOT Vmean:     60.100 cm/s LVOT VTI:       0.168 m  AORTA Ao Root diam: 3.20 cm Ao Asc diam:  2.50 cm MITRAL VALVE                TRICUSPID VALVE MV Area (PHT): 4.86 cm     TR Peak grad:   19.5 mmHg MV Decel Time: 156 msec     TR Vmax:        221.00 cm/s MV E velocity: 61.60 cm/s MV A velocity: 116.00 cm/s  SHUNTS MV E/A ratio:  0.53         Systemic VTI:  0.17 m                             Systemic Diam: 2.00 cm Skeet Latch MD Electronically  signed by Skeet Latch MD Signature Date/Time: 08/07/2022/11:46:52 AM    Final    CT Head Wo Contrast  Result Date: 08/07/2022 CLINICAL DATA:  Headache, new or worsening (Age >= 50y) EXAM: CT HEAD WITHOUT CONTRAST  TECHNIQUE: Contiguous axial images were obtained from the base of the skull through the vertex without intravenous contrast. RADIATION DOSE REDUCTION: This exam was performed according to the departmental dose-optimization program which includes automated exposure control, adjustment of the mA and/or kV according to patient size and/or use of iterative reconstruction technique. COMPARISON:  None Available. FINDINGS: Brain: Normal anatomic configuration. Parenchymal volume loss is commensurate with the patient's age. periventricular white matter changes are present likely reflecting the sequela of small vessel ischemia. No abnormal intra or extra-axial mass lesion or fluid collection. No abnormal mass effect or midline shift. No evidence of acute intracranial hemorrhage or infarct. Ventricular size is normal. Cerebellum unremarkable. Vascular: No asymmetric hyperdense vasculature at the skull base. Skull: Intact Sinuses/Orbits: Paranasal sinuses are clear. Orbits are unremarkable. Other: Mastoid air cells and middle ear cavities are clear. IMPRESSION: 1. No acute intracranial hemorrhage or infarct. 2. Mild senescent change. Electronically Signed   By: Fidela Salisbury M.D.   On: 08/07/2022 00:11   CT CHEST WO CONTRAST  Result Date: 08/06/2022 CLINICAL DATA:  Shortness of breath today. Pulmonary embolism suspected, high probability. EXAM: CT CHEST WITHOUT CONTRAST TECHNIQUE: Multidetector CT imaging of the chest was performed following the standard protocol without IV contrast. RADIATION DOSE REDUCTION: This exam was performed according to the departmental dose-optimization program which includes automated exposure control, adjustment of the mA and/or kV according to patient size and/or use of iterative reconstruction technique. COMPARISON:  Chest radiographs 08/06/2022 and 03/19/2022. Chest CT 01/22/2022 and 07/04/2021. FINDINGS: Cardiovascular: Atherosclerosis of the aorta, great vessels and coronary arteries. No  acute vascular findings are identified on noncontrast imaging. There is borderline central enlargement of the pulmonary arteries. The heart size is normal. There is no pericardial effusion. Mediastinum/Nodes: There are no enlarged mediastinal, hilar or axillary lymph nodes.Hilar assessment is limited by the lack of intravenous contrast, although the hilar contours appear unchanged. Small mediastinal lymph nodes are stable, likely reactive. Tracheostomy in place. Small hiatal hernia. Lungs/Pleura: No pleural effusion or pneumothorax. There is chronic scarring at both lung apices and diffuse central airway thickening. Subpleural left upper lobe nodule measuring 6 mm on image 15/5 is stable. 5 mm right middle lobe nodule on image 72/5 appears minimally larger (4 mm previously). Upper abdomen: The visualized upper abdomen appears stable. There are numerous simple and mildly complex cystic renal lesions bilaterally which are incompletely characterized without contrast, but grossly stable from previous study. These have been previously evaluated by MRI 01/16/2021 at which time one of the lesions in the interpolar region of the left kidney with considered indeterminate and MR follow-up was recommended. Musculoskeletal/Chest wall: There is no chest wall mass or suspicious osseous finding. Old left-sided rib fractures. IMPRESSION: 1. No acute chest findings or explanation for the patient's symptoms on noncontrast imaging. Vascular assessment limited without contrast. Cannot evaluate for pulmonary thromboembolic disease. 2. Tracheostomy tube remains in place. 3. Stable scattered small mediastinal lymph nodes, likely reactive. 4. Similar appearance of small pulmonary nodules bilaterally, likely benign. Stable biapical scarring and diffuse central airway thickening. 5. Coronary and Aortic Atherosclerosis (ICD10-I70.0). 6. Partially imaged renal lesions are grossly stable but suboptimally evaluated by CT. MR follow-up of an  indeterminate lesion in the interpolar region of the left kidney should be considered  per MRI recommendations of 01/16/2021 (if not previously performed elsewhere). Electronically Signed   By: Richardean Sale M.D.   On: 08/06/2022 19:12   DG Chest 2 View  Result Date: 08/06/2022 CLINICAL DATA:  Short of breath EXAM: CHEST - 2 VIEW COMPARISON:  Chest two-view 03/19/2022 FINDINGS: The heart size and mediastinal contours are within normal limits. Atherosclerotic aorta. Both lungs are clear. The visualized skeletal structures are unremarkable. IMPRESSION: No active cardiopulmonary disease. Electronically Signed   By: Franchot Gallo M.D.   On: 08/06/2022 17:59     Labs:   Basic Metabolic Panel: Recent Labs  Lab 08/06/22 1723 08/07/22 0506 08/08/22 0318  NA 139 140 138  K 4.5 4.4 4.5  CL 110 111 107  CO2 19* 20* 23  GLUCOSE 155* 156* 121*  BUN 50* 48* 55*  CREATININE 3.40* 3.20* 3.63*  CALCIUM 9.3 9.5 9.1  PHOS  --  4.2  --    GFR CrCl cannot be calculated (Unknown ideal weight.). Liver Function Tests: Recent Labs  Lab 08/07/22 0506  ALBUMIN 3.6   No results for input(s): "LIPASE", "AMYLASE" in the last 168 hours. No results for input(s): "AMMONIA" in the last 168 hours. Coagulation profile No results for input(s): "INR", "PROTIME" in the last 168 hours.  CBC: Recent Labs  Lab 08/06/22 1723 08/07/22 0506 08/08/22 0318  WBC 6.5 7.8 9.4  NEUTROABS 5.2  --  7.9*  HGB 8.9* 9.1* 8.1*  HCT 28.7* 28.3* 25.7*  MCV 86.2 85.5 86.0  PLT 247 206 221   Cardiac Enzymes: No results for input(s): "CKTOTAL", "CKMB", "CKMBINDEX", "TROPONINI" in the last 168 hours. BNP: Invalid input(s): "POCBNP" CBG: No results for input(s): "GLUCAP" in the last 168 hours. D-Dimer Recent Labs    08/06/22 2320  DDIMER >20.00*   Hgb A1c Recent Labs    08/07/22 0506  HGBA1C 5.5   Lipid Profile No results for input(s): "CHOL", "HDL", "LDLCALC", "TRIG", "CHOLHDL", "LDLDIRECT" in the last 72  hours. Thyroid function studies No results for input(s): "TSH", "T4TOTAL", "T3FREE", "THYROIDAB" in the last 72 hours.  Invalid input(s): "FREET3" Anemia work up No results for input(s): "VITAMINB12", "FOLATE", "FERRITIN", "TIBC", "IRON", "RETICCTPCT" in the last 72 hours. Microbiology Recent Results (from the past 240 hour(s))  SARS Coronavirus 2 by RT PCR (hospital order, performed in [facility] hospital lab) *cepheid single result test* Anterior Nasal Swab     Status: None   Collection Time: 08/06/22 10:36 PM   Specimen: Anterior Nasal Swab  Result Value Ref Range Status   SARS Coronavirus 2 by RT PCR NEGATIVE NEGATIVE Final    Comment: (NOTE) SARS-CoV-2 target nucleic acids are NOT DETECTED.  The SARS-CoV-2 RNA is generally detectable in upper and lower respiratory specimens during the acute phase of infection. The lowest concentration of SARS-CoV-2 viral copies this assay can detect is 250 copies / mL. A negative result does not preclude SARS-CoV-2 infection and should not be used as the sole basis for treatment or other patient management decisions.  A negative result may occur with improper specimen collection / handling, submission of specimen other than nasopharyngeal swab, presence of viral mutation(s) within the areas targeted by this assay, and inadequate number of viral copies (<250 copies / mL). A negative result must be combined with clinical observations, patient history, and epidemiological information.  Fact Sheet for Patients:   https://www.patel.info/  Fact Sheet for Healthcare Providers: https://hall.com/  This test is not yet approved or  cleared by the [place] and  has been authorized for detection and/or diagnosis of SARS-CoV-2 by FDA under an Emergency Use Authorization (EUA).  This EUA will remain in effect (meaning this test can be used) for the duration of the COVID-19 declaration under Section  564(b)(1) of the Act, 21 U.S.C. section 360bbb-3(b)(1), unless the authorization is terminated or revoked sooner.  Performed at [facility], [place] [address], [place], [place] [address]   MRSA Next Gen by PCR, Nasal     Status: Abnormal   Collection Time: 08/07/22 10:10 AM   Specimen: Nasal Mucosa; Nasal Swab  Result Value Ref Range Status   MRSA by PCR Next Gen DETECTED (A) NOT DETECTED Final    Comment: (NOTE) The GeneXpert MRSA Assay (FDA approved for NASAL specimens only), is one component of a comprehensive MRSA colonization surveillance program. It is not intended to diagnose MRSA infection nor to guide or monitor treatment for MRSA infections. Test performance is not FDA approved in patients less than 29 years old. Performed at [facility], [place] [address] [place], [place] [address]     Time coordinating discharge: 35 minutes  Signed: Marlowe Aschoff Corie Allis  [facility] 08/08/2022, 2:09 PM

## 2022-08-10 ENCOUNTER — Telehealth: Payer: Self-pay

## 2022-08-10 NOTE — Telephone Encounter (Signed)
Transition Care Management Unsuccessful Follow-up Telephone Call  Date of discharge and from where:  [place] 08-08-22 Dx: Dyspnea  Attempts:  1st Attempt  Reason for unsuccessful TCM follow-up call:  Left voice message  Transition Care Management Unsuccessful Follow-up Telephone Call  Date of discharge and from where: [place] 08-08-22 Dx: Dyspnea   Attempts:  2nd Attempt  Reason for unsuccessful TCM follow-up call:  Left voice message  Transition Care Management Unsuccessful Follow-up Telephone Call  Date of discharge and from where:  [place] 08-08-22 Dx: Dyspnea  Attempts:  3rd Attempt  Reason for unsuccessful TCM follow-up call:  Left voice message

## 2022-08-13 ENCOUNTER — Encounter: Payer: Self-pay | Admitting: Internal Medicine

## 2022-08-13 ENCOUNTER — Ambulatory Visit (INDEPENDENT_AMBULATORY_CARE_PROVIDER_SITE_OTHER): Payer: Medicare Other | Admitting: Internal Medicine

## 2022-08-13 DIAGNOSIS — Z8673 Personal history of transient ischemic attack (TIA), and cerebral infarction without residual deficits: Secondary | ICD-10-CM | POA: Insufficient documentation

## 2022-08-13 DIAGNOSIS — I639 Cerebral infarction, unspecified: Secondary | ICD-10-CM | POA: Insufficient documentation

## 2022-08-13 DIAGNOSIS — J441 Chronic obstructive pulmonary disease with (acute) exacerbation: Secondary | ICD-10-CM

## 2022-08-13 DIAGNOSIS — R490 Dysphonia: Secondary | ICD-10-CM | POA: Insufficient documentation

## 2022-08-13 MED ORDER — PREDNISONE 20 MG PO TABS: ORAL_TABLET | ORAL | 0 refills | Status: DC

## 2022-08-13 MED ORDER — LORAZEPAM 1 MG PO TABS
1.0000 mg | ORAL_TABLET | Freq: Two times a day (BID) | ORAL | 5 refills | Status: DC | PRN
Start: 2022-08-13 — End: 2023-03-01

## 2022-08-13 MED ORDER — CEFPODOXIME PROXETIL 200 MG PO TABS: 200.0000 mg | ORAL_TABLET | Freq: Two times a day (BID) | ORAL | 0 refills | Status: AC

## 2022-08-13 NOTE — Progress Notes (Signed)
   Subjective:   Patient ID: [name], female    DOB: [birth year], 76 y.o.   MRN: [id number]  HPI The patient is a 77 YO female coming in for hospital follow up. COPD exacerbation with elevated BP and troponin and given steroids in patient but no antibiotics or steroids on discharge.  Review of Systems  Constitutional:  Positive for activity change and appetite change.  HENT: Negative.    Eyes: Negative.   Respiratory:  Positive for cough and wheezing. Negative for chest tightness and shortness of breath.   Cardiovascular:  Negative for chest pain, palpitations and leg swelling.  Gastrointestinal:  Negative for abdominal distention, abdominal pain, constipation, diarrhea, nausea and vomiting.  Musculoskeletal: Negative.   Skin: Negative.   Neurological: Negative.   Psychiatric/Behavioral: Negative.      Objective:  Physical Exam Constitutional:      Appearance: She is well-developed. She is ill-appearing.  HENT:     Head: Normocephalic and atraumatic.  Cardiovascular:     Rate and Rhythm: Normal rate and regular rhythm.  Pulmonary:     Effort: Pulmonary effort is normal. No respiratory distress.     Breath sounds: Wheezing and rhonchi present. No rales.     Comments: Trach in place without infection Abdominal:     General: Bowel sounds are normal. There is no distension.     Palpations: Abdomen is soft.     Tenderness: There is no abdominal tenderness. There is no rebound.  Musculoskeletal:     Cervical back: Normal range of motion.  Skin:    General: Skin is warm and dry.  Neurological:     Mental Status: She is alert and oriented to person, place, and time.     Coordination: Coordination normal.     Vitals:   08/13/22 0952 08/13/22 1002  BP: (!) 170/80 (!) 170/80  Pulse: 79   Temp: 98.5 F (36.9 C)   TempSrc: Oral   SpO2: 97%   Weight: 115 lb (52.2 kg)   Height: '5\' 3"'$  (1.6 m)     Assessment & Plan:

## 2022-08-13 NOTE — Patient Instructions (Signed)
We have sent in antibiotics to take 1 pill twice a day for 1 week.  We have sent in the prednisone to take 2 pills daily for 1 week, then 1 pill daily for 1 week.  We have sent in the lorazepam 1 mg twice a day.

## 2022-08-14 NOTE — Assessment & Plan Note (Signed)
Treating for exacerbation with prednisone and vantin. She is having more mucus and change in color with increase in wheezing and rhonchi on exam.
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
## 2022-08-24 DIAGNOSIS — R490 Dysphonia: Secondary | ICD-10-CM | POA: Diagnosis not present

## 2022-08-24 DIAGNOSIS — T85638A Leakage of other specified internal prosthetic devices, implants and grafts, initial encounter: Secondary | ICD-10-CM | POA: Diagnosis not present

## 2022-08-24 DIAGNOSIS — Z963 Presence of artificial larynx: Secondary | ICD-10-CM | POA: Diagnosis not present

## 2022-09-02 ENCOUNTER — Inpatient Hospital Stay (HOSPITAL_COMMUNITY)
Admission: EM | Admit: 2022-09-02 | Discharge: 2022-09-17 | DRG: 291 | Disposition: A | Discharge status: Home Health | Payer: Medicare Other | Attending: Internal Medicine | Admitting: Internal Medicine

## 2022-09-02 ENCOUNTER — Emergency Department (HOSPITAL_COMMUNITY): Payer: Medicare Other

## 2022-09-02 DIAGNOSIS — D6832 Hemorrhagic disorder due to extrinsic circulating anticoagulants: Secondary | ICD-10-CM | POA: Diagnosis not present

## 2022-09-02 DIAGNOSIS — E785 Hyperlipidemia, unspecified: Secondary | ICD-10-CM | POA: Diagnosis not present

## 2022-09-02 DIAGNOSIS — R7989 Other specified abnormal findings of blood chemistry: Secondary | ICD-10-CM | POA: Diagnosis not present

## 2022-09-02 DIAGNOSIS — J440 Chronic obstructive pulmonary disease with acute lower respiratory infection: Secondary | ICD-10-CM | POA: Diagnosis not present

## 2022-09-02 DIAGNOSIS — R0902 Hypoxemia: Secondary | ICD-10-CM | POA: Diagnosis not present

## 2022-09-02 DIAGNOSIS — K219 Gastro-esophageal reflux disease without esophagitis: Secondary | ICD-10-CM | POA: Diagnosis present

## 2022-09-02 DIAGNOSIS — G4700 Insomnia, unspecified: Secondary | ICD-10-CM | POA: Diagnosis present

## 2022-09-02 DIAGNOSIS — D649 Anemia, unspecified: Secondary | ICD-10-CM | POA: Diagnosis present

## 2022-09-02 DIAGNOSIS — I5033 Acute on chronic diastolic (congestive) heart failure: Secondary | ICD-10-CM | POA: Diagnosis not present

## 2022-09-02 DIAGNOSIS — Z681 Body mass index (BMI) 19 or less, adult: Secondary | ICD-10-CM

## 2022-09-02 DIAGNOSIS — E039 Hypothyroidism, unspecified: Secondary | ICD-10-CM | POA: Diagnosis present

## 2022-09-02 DIAGNOSIS — R042 Hemoptysis: Secondary | ICD-10-CM | POA: Diagnosis not present

## 2022-09-02 DIAGNOSIS — J189 Pneumonia, unspecified organism: Secondary | ICD-10-CM | POA: Diagnosis present

## 2022-09-02 DIAGNOSIS — R42 Dizziness and giddiness: Secondary | ICD-10-CM | POA: Diagnosis not present

## 2022-09-02 DIAGNOSIS — I248 Other forms of acute ischemic heart disease: Secondary | ICD-10-CM | POA: Diagnosis not present

## 2022-09-02 DIAGNOSIS — F411 Generalized anxiety disorder: Secondary | ICD-10-CM | POA: Diagnosis present

## 2022-09-02 DIAGNOSIS — J449 Chronic obstructive pulmonary disease, unspecified: Secondary | ICD-10-CM | POA: Diagnosis not present

## 2022-09-02 DIAGNOSIS — J9601 Acute respiratory failure with hypoxia: Secondary | ICD-10-CM | POA: Diagnosis not present

## 2022-09-02 DIAGNOSIS — Z91148 Patient's other noncompliance with medication regimen for other reason: Secondary | ICD-10-CM

## 2022-09-02 DIAGNOSIS — I132 Hypertensive heart and chronic kidney disease with heart failure and with stage 5 chronic kidney disease, or end stage renal disease: Principal | ICD-10-CM | POA: Diagnosis present

## 2022-09-02 DIAGNOSIS — Z66 Do not resuscitate: Secondary | ICD-10-CM | POA: Diagnosis not present

## 2022-09-02 DIAGNOSIS — I480 Paroxysmal atrial fibrillation: Secondary | ICD-10-CM | POA: Diagnosis present

## 2022-09-02 DIAGNOSIS — I2489 Other forms of acute ischemic heart disease: Secondary | ICD-10-CM | POA: Diagnosis present

## 2022-09-02 DIAGNOSIS — G4489 Other headache syndrome: Secondary | ICD-10-CM | POA: Diagnosis not present

## 2022-09-02 DIAGNOSIS — I739 Peripheral vascular disease, unspecified: Secondary | ICD-10-CM | POA: Diagnosis present

## 2022-09-02 DIAGNOSIS — N289 Disorder of kidney and ureter, unspecified: Secondary | ICD-10-CM

## 2022-09-02 DIAGNOSIS — N179 Acute kidney failure, unspecified: Secondary | ICD-10-CM | POA: Diagnosis not present

## 2022-09-02 DIAGNOSIS — Z7901 Long term (current) use of anticoagulants: Secondary | ICD-10-CM

## 2022-09-02 DIAGNOSIS — N185 Chronic kidney disease, stage 5: Secondary | ICD-10-CM | POA: Diagnosis not present

## 2022-09-02 DIAGNOSIS — Z931 Gastrostomy status: Secondary | ICD-10-CM | POA: Diagnosis not present

## 2022-09-02 DIAGNOSIS — N184 Chronic kidney disease, stage 4 (severe): Secondary | ICD-10-CM

## 2022-09-02 DIAGNOSIS — R197 Diarrhea, unspecified: Secondary | ICD-10-CM | POA: Diagnosis not present

## 2022-09-02 DIAGNOSIS — Z8673 Personal history of transient ischemic attack (TIA), and cerebral infarction without residual deficits: Secondary | ICD-10-CM | POA: Diagnosis not present

## 2022-09-02 DIAGNOSIS — D631 Anemia in chronic kidney disease: Secondary | ICD-10-CM | POA: Diagnosis present

## 2022-09-02 DIAGNOSIS — K222 Esophageal obstruction: Secondary | ICD-10-CM | POA: Diagnosis present

## 2022-09-02 DIAGNOSIS — R0600 Dyspnea, unspecified: Secondary | ICD-10-CM | POA: Diagnosis present

## 2022-09-02 DIAGNOSIS — N189 Chronic kidney disease, unspecified: Secondary | ICD-10-CM | POA: Diagnosis present

## 2022-09-02 DIAGNOSIS — R609 Edema, unspecified: Secondary | ICD-10-CM | POA: Diagnosis not present

## 2022-09-02 DIAGNOSIS — F331 Major depressive disorder, recurrent, moderate: Secondary | ICD-10-CM | POA: Diagnosis present

## 2022-09-02 DIAGNOSIS — I130 Hypertensive heart and chronic kidney disease with heart failure and stage 1 through stage 4 chronic kidney disease, or unspecified chronic kidney disease: Secondary | ICD-10-CM | POA: Diagnosis not present

## 2022-09-02 DIAGNOSIS — I110 Hypertensive heart disease with heart failure: Secondary | ICD-10-CM | POA: Diagnosis not present

## 2022-09-02 DIAGNOSIS — G250 Essential tremor: Secondary | ICD-10-CM | POA: Diagnosis present

## 2022-09-02 DIAGNOSIS — D689 Coagulation defect, unspecified: Secondary | ICD-10-CM | POA: Diagnosis not present

## 2022-09-02 DIAGNOSIS — K5903 Drug induced constipation: Secondary | ICD-10-CM | POA: Diagnosis present

## 2022-09-02 DIAGNOSIS — I509 Heart failure, unspecified: Secondary | ICD-10-CM | POA: Diagnosis not present

## 2022-09-02 DIAGNOSIS — I10 Essential (primary) hypertension: Secondary | ICD-10-CM | POA: Diagnosis not present

## 2022-09-02 DIAGNOSIS — J9501 Hemorrhage from tracheostomy stoma: Secondary | ICD-10-CM | POA: Diagnosis not present

## 2022-09-02 DIAGNOSIS — I4892 Unspecified atrial flutter: Secondary | ICD-10-CM | POA: Diagnosis present

## 2022-09-02 DIAGNOSIS — Z8542 Personal history of malignant neoplasm of other parts of uterus: Secondary | ICD-10-CM

## 2022-09-02 DIAGNOSIS — R059 Cough, unspecified: Secondary | ICD-10-CM | POA: Diagnosis not present

## 2022-09-02 DIAGNOSIS — Z87891 Personal history of nicotine dependence: Secondary | ICD-10-CM

## 2022-09-02 DIAGNOSIS — R1314 Dysphagia, pharyngoesophageal phase: Secondary | ICD-10-CM | POA: Diagnosis present

## 2022-09-02 DIAGNOSIS — I161 Hypertensive emergency: Secondary | ICD-10-CM | POA: Diagnosis present

## 2022-09-02 DIAGNOSIS — Z9002 Acquired absence of larynx: Secondary | ICD-10-CM

## 2022-09-02 DIAGNOSIS — Z79890 Hormone replacement therapy (postmenopausal): Secondary | ICD-10-CM

## 2022-09-02 DIAGNOSIS — R778 Other specified abnormalities of plasma proteins: Secondary | ICD-10-CM

## 2022-09-02 DIAGNOSIS — Z923 Personal history of irradiation: Secondary | ICD-10-CM

## 2022-09-02 DIAGNOSIS — Z808 Family history of malignant neoplasm of other organs or systems: Secondary | ICD-10-CM

## 2022-09-02 DIAGNOSIS — R0601 Orthopnea: Secondary | ICD-10-CM | POA: Diagnosis present

## 2022-09-02 DIAGNOSIS — R110 Nausea: Secondary | ICD-10-CM | POA: Diagnosis not present

## 2022-09-02 DIAGNOSIS — I5032 Chronic diastolic (congestive) heart failure: Secondary | ICD-10-CM | POA: Insufficient documentation

## 2022-09-02 DIAGNOSIS — R519 Headache, unspecified: Secondary | ICD-10-CM | POA: Diagnosis not present

## 2022-09-02 DIAGNOSIS — Z888 Allergy status to other drugs, medicaments and biological substances status: Secondary | ICD-10-CM

## 2022-09-02 DIAGNOSIS — Z8521 Personal history of malignant neoplasm of larynx: Secondary | ICD-10-CM

## 2022-09-02 DIAGNOSIS — K047 Periapical abscess without sinus: Secondary | ICD-10-CM | POA: Diagnosis present

## 2022-09-02 DIAGNOSIS — I160 Hypertensive urgency: Secondary | ICD-10-CM | POA: Diagnosis not present

## 2022-09-02 DIAGNOSIS — Z79899 Other long term (current) drug therapy: Secondary | ICD-10-CM

## 2022-09-02 DIAGNOSIS — R64 Cachexia: Secondary | ICD-10-CM | POA: Diagnosis not present

## 2022-09-02 DIAGNOSIS — I129 Hypertensive chronic kidney disease with stage 1 through stage 4 chronic kidney disease, or unspecified chronic kidney disease: Secondary | ICD-10-CM | POA: Diagnosis present

## 2022-09-02 DIAGNOSIS — R531 Weakness: Secondary | ICD-10-CM | POA: Diagnosis not present

## 2022-09-02 DIAGNOSIS — T45515A Adverse effect of anticoagulants, initial encounter: Secondary | ICD-10-CM | POA: Diagnosis not present

## 2022-09-02 DIAGNOSIS — R0602 Shortness of breath: Secondary | ICD-10-CM | POA: Diagnosis not present

## 2022-09-02 DIAGNOSIS — E7800 Pure hypercholesterolemia, unspecified: Secondary | ICD-10-CM | POA: Diagnosis present

## 2022-09-02 DIAGNOSIS — M6281 Muscle weakness (generalized): Secondary | ICD-10-CM | POA: Diagnosis not present

## 2022-09-02 DIAGNOSIS — R6884 Jaw pain: Secondary | ICD-10-CM | POA: Diagnosis not present

## 2022-09-02 DIAGNOSIS — Z8589 Personal history of malignant neoplasm of other organs and systems: Secondary | ICD-10-CM

## 2022-09-02 DIAGNOSIS — G894 Chronic pain syndrome: Secondary | ICD-10-CM | POA: Diagnosis present

## 2022-09-02 LAB — BRAIN NATRIURETIC PEPTIDE: B Natriuretic Peptide: 1263.5 pg/mL — ABNORMAL HIGH (ref 0.0–100.0)

## 2022-09-02 LAB — COMPREHENSIVE METABOLIC PANEL
ALT: 12 U/L (ref 0–44)
AST: 16 U/L (ref 15–41)
Albumin: 2.7 g/dL — ABNORMAL LOW (ref 3.5–5.0)
Alkaline Phosphatase: 71 U/L (ref 38–126)
Anion gap: 8 (ref 5–15)
BUN: 54 mg/dL — ABNORMAL HIGH (ref 8–23)
CO2: 19 mmol/L — ABNORMAL LOW (ref 22–32)
Calcium: 8.6 mg/dL — ABNORMAL LOW (ref 8.9–10.3)
Chloride: 115 mmol/L — ABNORMAL HIGH (ref 98–111)
Creatinine, Ser: 3.56 mg/dL — ABNORMAL HIGH (ref 0.44–1.00)
GFR, Estimated: 13 mL/min — ABNORMAL LOW (ref >60)
Glucose, Bld: 100 mg/dL — ABNORMAL HIGH (ref 70–99)
Potassium: 4.9 mmol/L (ref 3.5–5.1)
Sodium: 142 mmol/L (ref 135–145)
Total Bilirubin: 0.5 mg/dL (ref 0.3–1.2)
Total Protein: 5.6 g/dL — ABNORMAL LOW (ref 6.5–8.1)

## 2022-09-02 LAB — CBC WITH DIFFERENTIAL/PLATELET
Abs Immature Granulocytes: 0.04 10*3/uL (ref 0.00–0.07)
Basophils Absolute: 0 10*3/uL (ref 0.0–0.1)
Basophils Relative: 0 %
Eosinophils Absolute: 0.2 10*3/uL (ref 0.0–0.5)
Eosinophils Relative: 2 %
HCT: 27 % — ABNORMAL LOW (ref 36.0–46.0)
Hemoglobin: 8.2 g/dL — ABNORMAL LOW (ref 12.0–15.0)
Immature Granulocytes: 0 %
Lymphocytes Relative: 6 %
Lymphs Abs: 0.5 10*3/uL — ABNORMAL LOW (ref 0.7–4.0)
MCH: 27.2 pg (ref 26.0–34.0)
MCHC: 30.4 g/dL (ref 30.0–36.0)
MCV: 89.4 fL (ref 80.0–100.0)
Monocytes Absolute: 0.4 10*3/uL (ref 0.1–1.0)
Monocytes Relative: 4 %
Neutro Abs: 8.5 10*3/uL — ABNORMAL HIGH (ref 1.7–7.7)
Neutrophils Relative %: 88 %
Platelets: 170 10*3/uL (ref 150–400)
RBC: 3.02 MIL/uL — ABNORMAL LOW (ref 3.87–5.11)
RDW: 17.7 % — ABNORMAL HIGH (ref 11.5–15.5)
WBC: 9.6 10*3/uL (ref 4.0–10.5)
nRBC: 0 % (ref 0.0–0.2)

## 2022-09-02 LAB — TROPONIN I (HIGH SENSITIVITY)
Troponin I (High Sensitivity): 1079 ng/L (ref <18)
Troponin I (High Sensitivity): 891 ng/L (ref <18)

## 2022-09-02 MED ORDER — DIPHENHYDRAMINE HCL 50 MG/ML IJ SOLN
25.0000 mg | Freq: Once | INTRAMUSCULAR | Status: AC
  Administered 2022-09-02: 25 mg via INTRAVENOUS
  Filled 2022-09-02: qty 1

## 2022-09-02 MED ORDER — LEVOTHYROXINE SODIUM 50 MCG PO TABS
50.0000 ug | ORAL_TABLET | Freq: Every day | ORAL | Status: DC
  Administered 2022-09-03 – 2022-09-17 (×13): 50 ug via ORAL
  Filled 2022-09-02 (×13): qty 1

## 2022-09-02 MED ORDER — FUROSEMIDE 10 MG/ML IJ SOLN
40.0000 mg | Freq: Once | INTRAMUSCULAR | Status: AC
  Administered 2022-09-02: 40 mg via INTRAVENOUS
  Filled 2022-09-02: qty 4

## 2022-09-02 MED ORDER — CILOSTAZOL 100 MG PO TABS
100.0000 mg | ORAL_TABLET | Freq: Two times a day (BID) | ORAL | Status: DC
  Administered 2022-09-02 – 2022-09-17 (×27): 100 mg via ORAL
  Filled 2022-09-02 (×32): qty 1

## 2022-09-02 MED ORDER — ACETAMINOPHEN 650 MG RE SUPP: 650.0000 mg | Freq: Four times a day (QID) | RECTAL | Status: DC | PRN

## 2022-09-02 MED ORDER — ISOSORBIDE MONONITRATE ER 60 MG PO TB24
60.0000 mg | ORAL_TABLET | Freq: Every day | ORAL | Status: DC
  Administered 2022-09-03 – 2022-09-12 (×9): 60 mg via ORAL
  Filled 2022-09-02 (×10): qty 1

## 2022-09-02 MED ORDER — HYDRALAZINE HCL 20 MG/ML IJ SOLN
20.0000 mg | Freq: Once | INTRAMUSCULAR | Status: AC
  Administered 2022-09-02: 20 mg via INTRAVENOUS
  Filled 2022-09-02: qty 1

## 2022-09-02 MED ORDER — ROSUVASTATIN CALCIUM 20 MG PO TABS
20.0000 mg | ORAL_TABLET | Freq: Every day | ORAL | Status: DC
  Administered 2022-09-02 – 2022-09-16 (×13): 20 mg via ORAL
  Filled 2022-09-02 (×15): qty 1

## 2022-09-02 MED ORDER — POLYETHYLENE GLYCOL 3350 17 G PO PACK: 17.0000 g | PACK | Freq: Every day | ORAL | Status: DC | PRN

## 2022-09-02 MED ORDER — MIRTAZAPINE 15 MG PO TABS
15.0000 mg | ORAL_TABLET | Freq: Every day | ORAL | Status: DC
  Administered 2022-09-02 – 2022-09-07 (×6): 15 mg via ORAL
  Filled 2022-09-02 (×2): qty 1
  Filled 2022-09-02: qty 2
  Filled 2022-09-02 (×4): qty 1

## 2022-09-02 MED ORDER — METOCLOPRAMIDE HCL 5 MG/ML IJ SOLN
10.0000 mg | Freq: Once | INTRAMUSCULAR | Status: AC
  Administered 2022-09-02: 10 mg via INTRAVENOUS
  Filled 2022-09-02: qty 2

## 2022-09-02 MED ORDER — SODIUM CHLORIDE 0.9% FLUSH
3.0000 mL | Freq: Two times a day (BID) | INTRAVENOUS | Status: DC
  Administered 2022-09-02 – 2022-09-17 (×30): 3 mL via INTRAVENOUS

## 2022-09-02 MED ORDER — LABETALOL HCL 5 MG/ML IV SOLN
5.0000 mg | INTRAVENOUS | Status: DC | PRN
  Administered 2022-09-04 – 2022-09-11 (×11): 5 mg via INTRAVENOUS
  Filled 2022-09-02 (×12): qty 4

## 2022-09-02 MED ORDER — DULOXETINE HCL 20 MG PO CPEP
20.0000 mg | ORAL_CAPSULE | Freq: Every day | ORAL | Status: DC
  Administered 2022-09-03 – 2022-09-06 (×4): 20 mg via ORAL
  Filled 2022-09-02 (×4): qty 1

## 2022-09-02 MED ORDER — LORAZEPAM 0.5 MG PO TABS
0.5000 mg | ORAL_TABLET | Freq: Two times a day (BID) | ORAL | Status: DC
  Administered 2022-09-02 – 2022-09-07 (×11): 0.5 mg via ORAL
  Filled 2022-09-02 (×12): qty 1

## 2022-09-02 MED ORDER — HYDRALAZINE HCL 20 MG/ML IJ SOLN
10.0000 mg | Freq: Once | INTRAMUSCULAR | Status: AC
  Administered 2022-09-02: 10 mg via INTRAVENOUS
  Filled 2022-09-02: qty 1

## 2022-09-02 MED ORDER — CARVEDILOL 25 MG PO TABS
25.0000 mg | ORAL_TABLET | Freq: Two times a day (BID) | ORAL | Status: DC
  Administered 2022-09-03 – 2022-09-17 (×27): 25 mg via ORAL
  Filled 2022-09-02: qty 2
  Filled 2022-09-02 (×2): qty 1
  Filled 2022-09-02: qty 2
  Filled 2022-09-02 (×3): qty 1
  Filled 2022-09-02 (×5): qty 2
  Filled 2022-09-02 (×2): qty 1
  Filled 2022-09-02: qty 2
  Filled 2022-09-02: qty 1
  Filled 2022-09-02: qty 2
  Filled 2022-09-02 (×5): qty 1
  Filled 2022-09-02: qty 2
  Filled 2022-09-02 (×3): qty 1
  Filled 2022-09-02 (×2): qty 2

## 2022-09-02 MED ORDER — HYDRALAZINE HCL 25 MG PO TABS
100.0000 mg | ORAL_TABLET | Freq: Three times a day (TID) | ORAL | Status: DC
  Administered 2022-09-02 – 2022-09-17 (×41): 100 mg via ORAL
  Filled 2022-09-02 (×44): qty 4

## 2022-09-02 MED ORDER — APIXABAN 2.5 MG PO TABS
2.5000 mg | ORAL_TABLET | Freq: Two times a day (BID) | ORAL | Status: DC
  Administered 2022-09-02: 2.5 mg via ORAL
  Filled 2022-09-02: qty 1

## 2022-09-02 MED ORDER — FUROSEMIDE 10 MG/ML IJ SOLN
40.0000 mg | Freq: Two times a day (BID) | INTRAMUSCULAR | Status: DC
  Administered 2022-09-03: 40 mg via INTRAVENOUS
  Filled 2022-09-02: qty 4

## 2022-09-02 MED ORDER — ACETAMINOPHEN 325 MG PO TABS
650.0000 mg | ORAL_TABLET | Freq: Four times a day (QID) | ORAL | Status: DC | PRN
  Administered 2022-09-03 – 2022-09-07 (×7): 650 mg via ORAL
  Filled 2022-09-02 (×7): qty 2

## 2022-09-02 MED ORDER — AMLODIPINE BESYLATE 5 MG PO TABS: 10.0000 mg | ORAL_TABLET | Freq: Every day | ORAL | Status: DC

## 2022-09-02 NOTE — H&P (Addendum)
History and Physical   [name] VOH:[id number] DOB: [birth year] DOA: 09/02/2022  PCP: Hoyt Koch, MD   Patient coming from: Home  Chief Complaint: Weakness, shortness of breath  HPI: [name] is a 76 y.o. female with medical history significant of atrial fibrillation, hypertension, anxiety, depression, hyperlipidemia, PAD, COPD, chronic pain, dysphagia, CKD 4, GERD, hypothyroidism, history of head neck cancer status post laryngectomy, anemia, CVA presenting with ongoing shortness of breath and weakness  History obtained with assistance of translation and family.  Patient has had continued shortness of breath and weakness ever since her recent admission around a month ago.  She was admitted from 8/24 until 8/26 with shortness of breath.  At that time she was also significantly hypertensive and noted to have elevated D-dimer that was worked up with negative CT without contrast and negative VQ scan.  She also received steroids and Lasix in the ED but did not require further steroid or significant diuresis.  Troponin was elevated but this is been going on and off chronically.  Echocardiogram showed EF 75% and G1 DD with normal RV function and was recommended for outpatient cardiology follow-up.  As above patient had continued symptoms ever since leaving that if not improved.  Primarily shortness of breath and weakness.  Also reports LE edema.  Son reports intermittent puffy face.  She denies fevers, chills, chest pain, abdominal pain, constipation, diarrhea, nausea, vomiting.  ED Course: Vital signs in the ED significant for heart rate in the 70s to 100s, blood pressure in the 230s then down to the 140s after IV hydralazine, respiratory rate in the teens to 20s, requiring 0 to 5 L by trach per chart review.  Lab work-up included CMP with chloride 115, bicarb 19, BUN 54, creatinine stable at 3.56, glucose 100, calcium 8.6, protein 5.6, albumin 2.7.  CBC with hemoglobin stable  8.2.  Troponin elevated at 1079 and then 891 on repeat.  BNP elevated to 1263.  Imaging included chest x-ray with cardiomegaly, vascular congestion and edema with perihilar density consistent with edema versus pneumonia.  CT head without acute abnormality.  CT C-spine without acute abnormality but did demonstrate degenerative disc disease and prominent laryngeal soft tissue consistent with her history.  Patient received Lasix, hydralazine IV, Reglan, Benadryl in the ED.  Cardiology consulted and recommended treatment of hypertension and CHF with suspicion for demand ischemia contributing to troponin elevation.  Review of Systems: As per HPI otherwise all other systems reviewed and are negative.  Past Medical History:  Diagnosis Date   Anemia    Anxiety    takes Ativan prn   Blood transfusion without reported diagnosis 09/15/12   2 units Prbc's   Broken ribs    Chronic back pain    CKD (chronic kidney disease), stage IV (HCC)    Constipation    related to pain meds   COPD (chronic obstructive pulmonary disease) ([place]) 08/10/2012   denies   Depression    Gastrostomy in place [facility])    removed   GERD (gastroesophageal reflux disease)    takes Zantac daily   Headache(784.0)    Hiatal hernia 08/10/2012   History of radiation therapy 10/17/12-11/25/12   supraglottic larynx,high risk neck tumor bed 5880 cGy/28 sessions, high risk lymph node tumor bed 5600 cGy/20 sessions, mod risk lymph node tumor bed 5040 cGy/20 sessions   Hypercholesteremia    takes Pravastatin daily   Hypertension    takes Tribenzor and Atenolol daily   Insomnia  takes Amitriptyline daily   Nausea    takes Zofran prn   PAD (peripheral artery disease) ([place])    noninvasive imaging in 2016   Pneumonia    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 08/08/2012   Shortness of breath dyspnea    Stroke [facility]) 2011   denies. no residual   Uterine cancer [facility])     Past Surgical History:  Procedure Laterality Date    ABDOMINAL SURGERY     r/t uterine carcinoma   APPENDECTOMY     DIRECT LARYNGOSCOPY N/A 05/22/2014   Procedure: DIRECT LARYNGOSCOPY WITH ESOPHAGEAL DILATION;  Surgeon: Jerrell Belfast, MD;  Location: [facility] OR;  Service: ENT;  Laterality: N/A;   ESOPHAGOSCOPY WITH DILITATION N/A 05/29/2015   Procedure: ESOPHAGOSCOPY WITH ESOPHAGEAL DILITATION;  Surgeon: Jerrell Belfast, MD;  Location: [place];  Service: ENT;  Laterality: N/A;   Gastrostomy Tube removed   2013   GASTROSTOMY W/ FEEDING TUBE  13   HERNIA REPAIR     child   LARYNGETOMY  08/31/2012   Procedure: LARYNGECTOMY;  Surgeon: Jerrell Belfast, MD;  Location: [place];  Service: ENT;  Laterality: N/A;   LARYNGOSCOPY  08/10/2012   Procedure: LARYNGOSCOPY;  Surgeon: Jerrell Belfast, MD;  Location: [facility];  Service: ENT;  Laterality: N/A;  with biopsy   RADICAL NECK DISSECTION  08/31/2012   Procedure: RADICAL NECK DISSECTION;  Surgeon: Jerrell Belfast, MD;  Location: [place];  Service: ENT;  Laterality: Bilateral;   TRACHEAL ESOPHOGEAL PUNCTURE WITH REPAIR STOMA N/A 09/08/2013   Procedure: TRACHEAL ESOPHOGEAL PUNCTURE WITH PLACEMENT OF  PROVOX PROSTHESIS ;  Surgeon: Jerrell Belfast, MD;  Location: [place];  Service: ENT;  Laterality: N/A;   TRACHEOSTOMY TUBE PLACEMENT  08/10/2012   Procedure: TRACHEOSTOMY;  Surgeon: Jerrell Belfast, MD;  Location: [facility];  Service: ENT;  Laterality: N/A;    Social History  reports that she quit smoking about 9 years ago. Her smoking use included cigarettes. She has a 12.50 pack-year smoking history. She has never used smokeless tobacco. She reports current alcohol use. She reports that she does not use drugs.  Allergies  Allergen Reactions   Xyzal [Levocetirizine Dihydrochloride] Itching   Augmentin [Amoxicillin-Pot Clavulanate] Diarrhea and Nausea And Vomiting   Tribenzor [Olmesartan-Amlodipine-Hctz] Other (See Comments)    "Hurt the kidneys"    Family History  Problem Relation Age of Onset   Throat cancer Father     Brain cancer Brother    CAD Neg Hx   Reviewed on admission  Prior to Admission medications   Medication Sig Start Date End Date Taking? Authorizing Provider  acetaminophen (TYLENOL) 500 MG tablet Take 1,000 mg by mouth as needed for headache.    [provider]  amLODipine (NORVASC) 10 MG tablet Take 1 tablet (10 mg total) by mouth daily. 01/09/22   Little Ishikawa, MD  apixaban (ELIQUIS) 2.5 MG TABS tablet Take 1 tablet (2.5 mg total) by mouth 2 (two) times daily. 02/10/22   Hoyt Koch, MD  carvedilol (COREG) 25 MG tablet TAKE 1 TABLET (25 MG TOTAL) BY MOUTH TWICE A DAY WITH MEALS Patient taking differently: Take 25 mg by mouth 2 (two) times daily with a meal. 05/26/22   Hoyt Koch, MD  cilostazol (PLETAL) 100 MG tablet TAKE 1 TABLET BY MOUTH TWICE A DAY Patient taking differently: Take 100 mg by mouth 2 (two) times daily. 02/03/22   Hoyt Koch, MD  docusate sodium (COLACE) 100 MG capsule Take 1 capsule (100  mg total) by mouth daily. 01/09/22   Little Ishikawa, MD  DULoxetine (CYMBALTA) 20 MG capsule TAKE 1 CAPSULE BY MOUTH EVERY DAY Patient taking differently: Take 20 mg by mouth daily. 07/23/22   Hoyt Koch, MD  Ensure (ENSURE) Take 237 mLs by mouth daily.    [provider]  fluticasone (FLONASE) 50 MCG/ACT nasal spray SPRAY 2 SPRAYS INTO EACH NOSTRIL EVERY DAY Patient taking differently: Place 2 sprays into both nostrils daily. 01/31/22   Janith Lima, MD  furosemide (LASIX) 20 MG tablet Take 1 tablet (20 mg total) by mouth daily as needed. 05/20/22   Hoyt Koch, MD  hydrALAZINE (APRESOLINE) 100 MG tablet Take 1 tablet (100 mg total) by mouth 3 (three) times daily. 03/03/22   Hoyt Koch, MD  isosorbide mononitrate (IMDUR) 60 MG 24 hr tablet TAKE 1 TABLET BY MOUTH EVERY DAY Patient taking differently: Take 60 mg by mouth daily. 07/29/22   Hoyt Koch, MD  levothyroxine (SYNTHROID) 50 MCG tablet  TAKE 1 TABLET BY MOUTH EVERY DAY BEFORE BREAKFAST Patient taking differently: Take 50 mcg by mouth daily before breakfast. 04/30/22   Hoyt Koch, MD  loperamide (IMODIUM A-D) 2 MG tablet Take 2 mg by mouth as needed for diarrhea or loose stools.    [provider]  LORazepam (ATIVAN) 1 MG tablet Take 1 tablet (1 mg total) by mouth 2 (two) times daily as needed for anxiety. 08/13/22   Hoyt Koch, MD  mirtazapine (REMERON) 15 MG tablet Take 15 mg by mouth at bedtime.    [provider]  ondansetron (ZOFRAN) 4 MG tablet TAKE 1 TABLET BY MOUTH EVERY 8 HOURS AS NEEDED FOR NAUSEA AND VOMITING Patient taking differently: Take 4 mg by mouth as needed for nausea or vomiting. 02/03/22   Hoyt Koch, MD  predniSONE (DELTASONE) 20 MG tablet Days 1-7 take 2 pills daily. Days 8-14 take 1 pill daily 08/13/22   Hoyt Koch, MD  rosuvastatin (CRESTOR) 20 MG tablet TAKE 1 TABLET (20 MG TOTAL) BY MOUTH DAILY. Patient taking differently: Take 20 mg by mouth at bedtime. 07/29/22   Hoyt Koch, MD  levocetirizine (XYZAL) 5 MG tablet TAKE 1 TABLET(5 MG) BY MOUTH EVERY EVENING 02/21/20 06/10/20  Janith Lima, MD    Physical Exam: Vitals:   09/02/22 2020 09/02/22 2030 09/02/22 2100 09/02/22 2130  BP:  (!) 155/71 (!) 147/66 (!) 136/58  Pulse:  (!) 106 (!) 108 (!) 116  Resp:  (!) 22 20 (!) 26  Temp:      TempSrc:      SpO2: 97% 97% 96% 96%    Physical Exam Constitutional:      General: She is not in acute distress.    Appearance: Normal appearance.  HENT:     Head: Normocephalic and atraumatic.     Mouth/Throat:     Mouth: Mucous membranes are moist.     Pharynx: Oropharynx is clear.  Eyes:     Extraocular Movements: Extraocular movements intact.     Pupils: Pupils are equal, round, and reactive to light.  Cardiovascular:     Rate and Rhythm: Normal rate and regular rhythm.     Pulses: Normal pulses.     Heart sounds: Normal heart sounds.   Pulmonary:     Effort: Pulmonary effort is normal. No respiratory distress.     Breath sounds: Wheezing and rales present.  Abdominal:     General: Bowel sounds are  normal. There is no distension.     Palpations: Abdomen is soft.     Tenderness: There is no abdominal tenderness.  Musculoskeletal:        General: No swelling or deformity.     Right lower leg: Edema (Trace) present.     Left lower leg: Edema (Trace) present.  Skin:    General: Skin is warm and dry.  Neurological:     General: No focal deficit present.     Mental Status: Mental status is at baseline.    Labs on Admission: I have personally reviewed following labs and imaging studies  CBC: Recent Labs  Lab 09/02/22 1827  WBC 9.6  NEUTROABS 8.5*  HGB 8.2*  HCT 27.0*  MCV 89.4  PLT 163    Basic Metabolic Panel: Recent Labs  Lab 09/02/22 1827  NA 142  K 4.9  CL 115*  CO2 19*  GLUCOSE 100*  BUN 54*  CREATININE 3.56*  CALCIUM 8.6*    GFR: CrCl cannot be calculated (Unknown ideal weight.).  Liver Function Tests: Recent Labs  Lab 09/02/22 1827  AST 16  ALT 12  ALKPHOS 71  BILITOT 0.5  PROT 5.6*  ALBUMIN 2.7*    Urine analysis:    Component Value Date/Time   COLORURINE YELLOW 01/04/2022 2329   APPEARANCEUR CLOUDY (A) 01/04/2022 2329   LABSPEC 1.017 01/04/2022 2329   PHURINE 9.0 (H) 01/04/2022 2329   GLUCOSEU NEGATIVE 01/04/2022 2329   GLUCOSEU NEGATIVE 08/11/2021 0942   HGBUR NEGATIVE 01/04/2022 2329   BILIRUBINUR NEGATIVE 01/04/2022 2329   BILIRUBINUR Negative 01/19/2019 1510   KETONESUR NEGATIVE 01/04/2022 2329   PROTEINUR >=300 (A) 01/04/2022 2329   UROBILINOGEN 0.2 08/11/2021 0942   NITRITE NEGATIVE 01/04/2022 2329   LEUKOCYTESUR LARGE (A) 01/04/2022 2329    Radiological Exams on Admission: CT Cervical Spine Wo Contrast  Result Date: 09/02/2022 CLINICAL DATA:  Dizziness and weakness. EXAM: CT CERVICAL SPINE WITHOUT CONTRAST TECHNIQUE: Multidetector CT imaging of the  cervical spine was performed without intravenous contrast. Multiplanar CT image reconstructions were also generated. RADIATION DOSE REDUCTION: This exam was performed according to the departmental dose-optimization program which includes automated exposure control, adjustment of the mA and/or kV according to patient size and/or use of iterative reconstruction technique. COMPARISON:  June 14, 2021 FINDINGS: Alignment: There is straightening of the normal cervical spine lordosis. Skull base and vertebrae: No acute fracture. No primary bone lesion or focal pathologic process. Soft tissues and spinal canal: Prominent laryngeal soft tissues are seen at the level of C2-C3 (sagittal reformatted images 48 through 53, CT series 7). This is seen on the prior study and is more prominent on the current exam. Disc levels: Marked severity endplate sclerosis, mild to moderate severity anterior osteophyte formation and posterior bony spurring are seen at the levels of C5-C6 and C6-C7. There is marked severity narrowing of the anterior atlantoaxial articulation. Marked severity intervertebral disc space narrowing is seen at C5-C6, C6-C7 and C7-T1. Bilateral moderate to marked severity multilevel facet joint hypertrophy is noted. Upper chest: Mild to moderate severity biapical scarring and/or atelectasis is seen. Other: None. IMPRESSION: 1. Marked severity multilevel degenerative changes without evidence of an acute fracture or subluxation. 2. Prominent laryngeal soft tissues which corresponds to the patient's history of laryngeal cancer. Further evaluation with contrast enhanced MRI of the neck is recommended to exclude the presence of local recurrence. 3. Mild to moderate severity biapical scarring and/or atelectasis. Electronically Signed   By: Joyce Gross.D.  On: 09/02/2022 19:56   CT HEAD WO CONTRAST (5MM)  Result Date: 09/02/2022 CLINICAL DATA:  Generalized weakness and dizziness. EXAM: CT HEAD WITHOUT CONTRAST  TECHNIQUE: Contiguous axial images were obtained from the base of the skull through the vertex without intravenous contrast. RADIATION DOSE REDUCTION: This exam was performed according to the departmental dose-optimization program which includes automated exposure control, adjustment of the mA and/or kV according to patient size and/or use of iterative reconstruction technique. COMPARISON:  August 06, 2022 FINDINGS: Brain: There is mild cerebral atrophy with widening of the extra-axial spaces and ventricular dilatation. There are areas of decreased attenuation within the white matter tracts of the supratentorial brain, consistent with microvascular disease changes. Vascular: No hyperdense vessel or unexpected calcification. Skull: Normal. Negative for fracture or focal lesion. Sinuses/Orbits: No acute finding. Other: None. IMPRESSION: No acute intracranial abnormality. Electronically Signed   By: Virgina Norfolk M.D.   On: 09/02/2022 19:44   DG Chest Port 1 View  Result Date: 09/02/2022 CLINICAL DATA:  Weakness. EXAM: PORTABLE CHEST 1 VIEW COMPARISON:  Chest CT dated 08/06/2022. FINDINGS: There is mild cardiomegaly with vascular congestion and edema. Right perihilar rounded density may represent edema or developing pneumonia. Clinical correlation and follow-up to resolution recommended. No pleural effusion or pneumothorax. Atherosclerotic calcification of the aorta. No acute osseous pathology. IMPRESSION: 1. Mild cardiomegaly with vascular congestion and edema. 2. Right perihilar rounded density may represent edema or developing pneumonia. Electronically Signed   By: Anner Crete M.D.   On: 09/02/2022 19:16    EKG: Independently reviewed.  Sinus rhythm at 78 bpm.  QTc 503.  Assessment/Plan Principal Problem:   Acute on chronic diastolic CHF (congestive heart failure) (HCC) Active Problems:   Hypertension   GAD (generalized anxiety disorder)   COPD (chronic obstructive pulmonary disease) (HCC)    Dysphagia, pharyngoesophageal phase   Moderate episode of recurrent major depressive disorder (HCC)   Hyperlipidemia LDL goal <130   CKD stage 4 secondary to hypertension (HCC)   PAD (peripheral artery disease) (HCC)   Hypothyroidism   S/P laryngectomy   PAF (paroxysmal atrial fibrillation)-CHADS2 Vas score of 5   Demand ischemia (HCC)   Hypertensive emergency   Anemia in CKD (chronic kidney disease)   History of CVA (cerebrovascular accident)   Acute on chronic diastolic CHF Hypertensive emergency Demand ischemia > Patient presenting with ongoing shortness breath and weakness present since recent admission around a month ago.  No obvious cause at that time other than her hypertension. > On this presentation patient noted to have newly elevated BNP to 1263.  Also similarly elevated troponin at 1079 and then improved to 890 on repeat. > Also significantly hypertensive on presentation to 350K systolic did not respond to initial IV hydralazine in the ED and on second dose reduced to the 140s. > Presentation consistent with hypertensive emergency/CHF exacerbation.  BNP points for CHF and this is new.  Demand ischemia is a likely etiology for troponin which is intermittently elevated and is without chest pain.  No indication for heparin at this time per cardiology was consulted in the ED. > Received Lasix and IV hydralazine in the ED. - Monitor on telemetry unit - Continue with IV Lasix twice daily - Strict I's and O's - Daily weights - No repeat echo as last was a month ago - Fluid restricted diet - Continue home amlodipine, carvedilol, hydralazine, Imdur - As needed labetalol for blood pressure greater than 938 systolic  A-fib - Continue home carvedilol and Eliquis  Hyperlipidemia PAD - Continue home rosuvastatin, cilostazol  CKD 4 > Creatinine stable at 3.56 - Trend renal function and electrolytes - Avoid nephrotoxic agents when able  Anemia > Hemoglobin stable at 8.2 -  Continue to trend CBC  Hypothyroidism - Continue home Synthroid  Anxiety Depression - Continue home fluoxetine, Remeron, Ativan  COPD > History of this however do not see any inhalers in chart.  History of head/neck cancer  > Status post laryngectomy with trach in place. - Noted  DVT prophylaxis: Eliquis Code Status:   Full Family Communication:  Son updated by phone  Disposition Plan:   Patient is from:  Home  Anticipated DC to:  Home  Anticipated DC date:  1 to 3 days  Anticipated DC barriers: None  Consults called:  Cardiology consulted in the ED, are not following. Admission status:  Observation, telemetry  Severity of Illness: The appropriate patient status for this patient is OBSERVATION. Observation status is judged to be reasonable and necessary in order to provide the required intensity of service to ensure the patient's safety. The patient's presenting symptoms, physical exam findings, and initial radiographic and laboratory data in the context of their medical condition is felt to place them at decreased risk for further clinical deterioration. Furthermore, it is anticipated that the patient will be medically stable for discharge from the hospital within 2 midnights of admission.    Marcelyn Bruins MD [facility]  How to contact the [facility] Attending or Consulting provider [place] or covering provider during after hours [place], for this patient?   Check the care team in [facility] and look for a) attending/consulting [facility] provider listed and b) the [facility] team listed Log into [email or web address] and use [place]'s universal password to access. If you do not have the password, please contact the hospital operator. Locate the [facility] provider you are looking for under [facility] and page to a number that you can be directly reached. If you still have difficulty reaching the provider, please page the [facility] (Director on Call) for the Hospitalists listed on amion for  assistance.  09/02/2022, 10:28 PM

## 2022-09-02 NOTE — ED Triage Notes (Signed)
Ems brings pt in from home for generalized weakness that started today. States she has not been out of bed today.

## 2022-09-02 NOTE — ED Notes (Signed)
Purewick placed and patient repositioned in bed

## 2022-09-02 NOTE — ED Notes (Signed)
Son has arrived and wants his mom on oxygen because he stated she is [facility], respiratory rate wnl and oxygen level wnl, called respiratory and made provider aware

## 2022-09-02 NOTE — ED Provider Notes (Signed)
[name]   CSN: [id number] Arrival date & time: 09/02/22  1814     History  Chief Complaint  Patient presents with   Weakness    [name] is a 76 y.o. female hx of afib eliquis, laryngeal cancer status post laryngectomy, heart failure here presenting with shortness of breath and persistent weakness.  Patient was recently admitted to the hospital and had extensive work-up.  She had elevated D-dimer but due to her elevated creatinine, she was unable to get a CTA.  And states she had a negative VQ scan.  Patient was admitted for hypertensive urgency.  Patient received IV BP meds and was admitted to the hospital.  Since discharge, she has been persistently weak and short of breath. Patient unable to give much history and history per the son who lives with her.  No fevers.  The history is provided by the patient and a relative.       Home Medications Prior to Admission medications   Medication Sig Start Date End Date Taking? Authorizing Provider  acetaminophen (TYLENOL) 500 MG tablet Take 1,000 mg by mouth as needed for headache.    [provider]  amLODipine (NORVASC) 10 MG tablet Take 1 tablet (10 mg total) by mouth daily. 01/09/22   Little Ishikawa, MD  apixaban (ELIQUIS) 2.5 MG TABS tablet Take 1 tablet (2.5 mg total) by mouth 2 (two) times daily. 02/10/22   Hoyt Koch, MD  carvedilol (COREG) 25 MG tablet TAKE 1 TABLET (25 MG TOTAL) BY MOUTH TWICE A DAY WITH MEALS Patient taking differently: Take 25 mg by mouth 2 (two) times daily with a meal. 05/26/22   Hoyt Koch, MD  cilostazol (PLETAL) 100 MG tablet TAKE 1 TABLET BY MOUTH TWICE A DAY Patient taking differently: Take 100 mg by mouth 2 (two) times daily. 02/03/22   Hoyt Koch, MD  docusate sodium (COLACE) 100 MG capsule Take 1 capsule (100 mg total) by mouth daily. 01/09/22   Little Ishikawa, MD  DULoxetine (CYMBALTA) 20 MG  capsule TAKE 1 CAPSULE BY MOUTH EVERY DAY Patient taking differently: Take 20 mg by mouth daily. 07/23/22   Hoyt Koch, MD  Ensure (ENSURE) Take 237 mLs by mouth daily.    [provider]  fluticasone (FLONASE) 50 MCG/ACT nasal spray SPRAY 2 SPRAYS INTO EACH NOSTRIL EVERY DAY Patient taking differently: Place 2 sprays into both nostrils daily. 01/31/22   Janith Lima, MD  furosemide (LASIX) 20 MG tablet Take 1 tablet (20 mg total) by mouth daily as needed. 05/20/22   Hoyt Koch, MD  hydrALAZINE (APRESOLINE) 100 MG tablet Take 1 tablet (100 mg total) by mouth 3 (three) times daily. 03/03/22   Hoyt Koch, MD  isosorbide mononitrate (IMDUR) 60 MG 24 hr tablet TAKE 1 TABLET BY MOUTH EVERY DAY Patient taking differently: Take 60 mg by mouth daily. 07/29/22   Hoyt Koch, MD  levothyroxine (SYNTHROID) 50 MCG tablet TAKE 1 TABLET BY MOUTH EVERY DAY BEFORE BREAKFAST Patient taking differently: Take 50 mcg by mouth daily before breakfast. 04/30/22   Hoyt Koch, MD  loperamide (IMODIUM A-D) 2 MG tablet Take 2 mg by mouth as needed for diarrhea or loose stools.    [provider]  LORazepam (ATIVAN) 1 MG tablet Take 1 tablet (1 mg total) by mouth 2 (two) times daily as needed for anxiety. 08/13/22   Hoyt Koch, MD  mirtazapine (REMERON)  15 MG tablet Take 15 mg by mouth at bedtime.    [provider]  ondansetron (ZOFRAN) 4 MG tablet TAKE 1 TABLET BY MOUTH EVERY 8 HOURS AS NEEDED FOR NAUSEA AND VOMITING Patient taking differently: Take 4 mg by mouth as needed for nausea or vomiting. 02/03/22   Hoyt Koch, MD  predniSONE (DELTASONE) 20 MG tablet Days 1-7 take 2 pills daily. Days 8-14 take 1 pill daily 08/13/22   Hoyt Koch, MD  rosuvastatin (CRESTOR) 20 MG tablet TAKE 1 TABLET (20 MG TOTAL) BY MOUTH DAILY. Patient taking differently: Take 20 mg by mouth at bedtime. 07/29/22   Hoyt Koch, MD   levocetirizine (XYZAL) 5 MG tablet TAKE 1 TABLET(5 MG) BY MOUTH EVERY EVENING 02/21/20 06/10/20  Janith Lima, MD      Allergies    Xyzal [levocetirizine dihydrochloride], Augmentin [amoxicillin-pot clavulanate], and Tribenzor [olmesartan-amlodipine-hctz]    Review of Systems   Review of Systems  Respiratory:  Positive for shortness of breath.   Neurological:  Positive for weakness.  All other systems reviewed and are negative.   Physical Exam Updated Vital Signs BP (!) 147/66   Pulse (!) 108   Temp 98.6 F (37 C) (Oral)   Resp 20   SpO2 96%  Physical Exam Vitals and nursing Shaw reviewed.  Constitutional:      Comments: Tachypneic and chronically ill  HENT:     Head: Normocephalic.     Nose: Nose normal.     Mouth/Throat:     Mouth: Mucous membranes are dry.  Eyes:     Extraocular Movements: Extraocular movements intact.     Pupils: Pupils are equal, round, and reactive to light.  Neck:     Comments: Tracheostomy site is present and patient does not have a tracheostomy tube Cardiovascular:     Rate and Rhythm: Normal rate.  Pulmonary:     Comments: Tachypneic and crackles bilateral bases Abdominal:     General: Abdomen is flat.     Palpations: Abdomen is soft.  Musculoskeletal:        General: Normal range of motion.     Comments: 1+ edema bilaterally  Skin:    General: Skin is warm.     Capillary Refill: Capillary refill takes less than 2 seconds.  Neurological:     General: No focal deficit present.     Mental Status: She is oriented to person, place, and time.  Psychiatric:        Mood and Affect: Mood normal.        Behavior: Behavior normal.     ED Results / Procedures / Treatments   Labs (all labs ordered are listed, but only abnormal results are displayed) Labs Reviewed  CBC WITH DIFFERENTIAL/PLATELET - Abnormal; Notable for the following components:      Result Value   RBC 3.02 (*)    Hemoglobin 8.2 (*)    HCT 27.0 (*)    RDW 17.7 (*)     Neutro Abs 8.5 (*)    Lymphs Abs 0.5 (*)    All other components within normal limits  COMPREHENSIVE METABOLIC PANEL - Abnormal; Notable for the following components:   Chloride 115 (*)    CO2 19 (*)    Glucose, Bld 100 (*)    BUN 54 (*)    Creatinine, Ser 3.56 (*)    Calcium 8.6 (*)    Total Protein 5.6 (*)    Albumin 2.7 (*)    GFR, Estimated  13 (*)    All other components within normal limits  BRAIN NATRIURETIC PEPTIDE - Abnormal; Notable for the following components:   B Natriuretic Peptide 1,263.5 (*)    All other components within normal limits  TROPONIN I (HIGH SENSITIVITY) - Abnormal; Notable for the following components:   Troponin I (High Sensitivity) 1,079 (*)    All other components within normal limits  TROPONIN I (HIGH SENSITIVITY)    EKG None  Radiology CT Cervical Spine Wo Contrast  Result Date: 09/02/2022 CLINICAL DATA:  Dizziness and weakness. EXAM: CT CERVICAL SPINE WITHOUT CONTRAST TECHNIQUE: Multidetector CT imaging of the cervical spine was performed without intravenous contrast. Multiplanar CT image reconstructions were also generated. RADIATION DOSE REDUCTION: This exam was performed according to the departmental dose-optimization program which includes automated exposure control, adjustment of the mA and/or kV according to patient size and/or use of iterative reconstruction technique. COMPARISON:  June 14, 2021 FINDINGS: Alignment: There is straightening of the normal cervical spine lordosis. Skull base and vertebrae: No acute fracture. No primary bone lesion or focal pathologic process. Soft tissues and spinal canal: Prominent laryngeal soft tissues are seen at the level of C2-C3 (sagittal reformatted images 48 through 53, CT series 7). This is seen on the prior study and is more prominent on the current exam. Disc levels: Marked severity endplate sclerosis, mild to moderate severity anterior osteophyte formation and posterior bony spurring are seen at the levels  of C5-C6 and C6-C7. There is marked severity narrowing of the anterior atlantoaxial articulation. Marked severity intervertebral disc space narrowing is seen at C5-C6, C6-C7 and C7-T1. Bilateral moderate to marked severity multilevel facet joint hypertrophy is noted. Upper chest: Mild to moderate severity biapical scarring and/or atelectasis is seen. Other: None. IMPRESSION: 1. Marked severity multilevel degenerative changes without evidence of an acute fracture or subluxation. 2. Prominent laryngeal soft tissues which corresponds to the patient's history of laryngeal cancer. Further evaluation with contrast enhanced MRI of the neck is recommended to exclude the presence of local recurrence. 3. Mild to moderate severity biapical scarring and/or atelectasis. Electronically Signed   By: Virgina Norfolk M.D.   On: 09/02/2022 19:56   CT HEAD WO CONTRAST (5MM)  Result Date: 09/02/2022 CLINICAL DATA:  Generalized weakness and dizziness. EXAM: CT HEAD WITHOUT CONTRAST TECHNIQUE: Contiguous axial images were obtained from the base of the skull through the vertex without intravenous contrast. RADIATION DOSE REDUCTION: This exam was performed according to the departmental dose-optimization program which includes automated exposure control, adjustment of the mA and/or kV according to patient size and/or use of iterative reconstruction technique. COMPARISON:  August 06, 2022 FINDINGS: Brain: There is mild cerebral atrophy with widening of the extra-axial spaces and ventricular dilatation. There are areas of decreased attenuation within the white matter tracts of the supratentorial brain, consistent with microvascular disease changes. Vascular: No hyperdense vessel or unexpected calcification. Skull: Normal. Negative for fracture or focal lesion. Sinuses/Orbits: No acute finding. Other: None. IMPRESSION: No acute intracranial abnormality. Electronically Signed   By: Virgina Norfolk M.D.   On: 09/02/2022 19:44   DG  Chest Port 1 View  Result Date: 09/02/2022 CLINICAL DATA:  Weakness. EXAM: PORTABLE CHEST 1 VIEW COMPARISON:  Chest CT dated 08/06/2022. FINDINGS: There is mild cardiomegaly with vascular congestion and edema. Right perihilar rounded density may represent edema or developing pneumonia. Clinical correlation and follow-up to resolution recommended. No pleural effusion or pneumothorax. Atherosclerotic calcification of the aorta. No acute osseous pathology. IMPRESSION: 1. Mild cardiomegaly with vascular  congestion and edema. 2. Right perihilar rounded density may represent edema or developing pneumonia. Electronically Signed   By: Anner Crete M.D.   On: 09/02/2022 19:16    Procedures Procedures     CRITICAL CARE Performed by: Wandra Arthurs   Total critical care time: 30 minutes  Critical care time was exclusive of separately billable procedures and treating other patients.  Critical care was necessary to treat or prevent imminent or life-threatening deterioration.  Critical care was time spent personally by me on the following activities: development of treatment plan with patient and/or surrogate as well as nursing, discussions with consultants, evaluation of patient's response to treatment, examination of patient, obtaining history from patient or surrogate, ordering and performing treatments and interventions, ordering and review of laboratory studies, ordering and review of radiographic studies, pulse oximetry and re-evaluation of patient's condition.  Medications Ordered in ED Medications  hydrALAZINE (APRESOLINE) injection 10 mg (10 mg Intravenous Given 09/02/22 1857)  hydrALAZINE (APRESOLINE) injection 20 mg (20 mg Intravenous Given 09/02/22 2000)  metoCLOPramide (REGLAN) injection 10 mg (10 mg Intravenous Given 09/02/22 2035)  diphenhydrAMINE (BENADRYL) injection 25 mg (25 mg Intravenous Given 09/02/22 2033)  furosemide (LASIX) injection 40 mg (40 mg Intravenous Given 09/02/22 2034)     ED Course/ Medical Decision Making/ A&P                           Medical Decision Making [name] is a 76 y.o. female here presenting with shortness of breath and weakness.  Patient was hypertensive with blood pressure over 200.  Concern for possible hypertensive emergency including head bleed versus ACS versus heart failure.  Plan to get CBC and CMP and BNP and troponin and CT head.  Patient has known baseline creatinine of over 3 so will not be able to get IV contrast.  I have low suspicion for dissection  9:25 PM I have reviewed patient's labs and then dependently interpreted imaging studies.  Patient's blood pressure is down to the 140s now.  Patient's initial troponin is over 1000.  Repeat troponin is a 91.  Patient's BNP is 1200 and it was 100 during last admission.  CT head did not show any bleeding.  I discussed case with cardiology (Dr. Kalman Shan).  He states that he likely has demand ischemia from renal failure and heart failure.  He wants to hold off on heparin for now.  Patient will be admitted for hypertensive urgency, heart failure exacerbation.    Problems Addressed: Acute on chronic congestive heart failure, unspecified heart failure type [facility]): acute illness or injury Elevated troponin: chronic illness or injury with exacerbation, progression, or side effects of treatment Renal insufficiency: acute illness or injury  Amount and/or Complexity of Data Reviewed Labs: ordered. Decision-making details documented in ED Course. Radiology: ordered and independent interpretation performed. Decision-making details documented in ED Course. ECG/medicine tests: ordered and independent interpretation performed. Decision-making details documented in ED Course.  Risk Prescription drug management.   Final Clinical Impression(s) / ED Diagnoses Final diagnoses:  None    Rx / DC Orders ED Discharge Orders     None         Drenda Freeze, MD 09/02/22 2127

## 2022-09-03 ENCOUNTER — Other Ambulatory Visit: Payer: Self-pay

## 2022-09-03 ENCOUNTER — Encounter: Payer: Self-pay | Admitting: Internal Medicine

## 2022-09-03 ENCOUNTER — Encounter (HOSPITAL_COMMUNITY): Payer: Self-pay | Admitting: Internal Medicine

## 2022-09-03 DIAGNOSIS — R042 Hemoptysis: Secondary | ICD-10-CM | POA: Diagnosis not present

## 2022-09-03 DIAGNOSIS — I480 Paroxysmal atrial fibrillation: Secondary | ICD-10-CM | POA: Diagnosis present

## 2022-09-03 DIAGNOSIS — Z931 Gastrostomy status: Secondary | ICD-10-CM | POA: Diagnosis not present

## 2022-09-03 DIAGNOSIS — Z681 Body mass index (BMI) 19 or less, adult: Secondary | ICD-10-CM | POA: Diagnosis not present

## 2022-09-03 DIAGNOSIS — R531 Weakness: Secondary | ICD-10-CM | POA: Diagnosis not present

## 2022-09-03 DIAGNOSIS — R7989 Other specified abnormal findings of blood chemistry: Secondary | ICD-10-CM

## 2022-09-03 DIAGNOSIS — N185 Chronic kidney disease, stage 5: Secondary | ICD-10-CM | POA: Diagnosis present

## 2022-09-03 DIAGNOSIS — J449 Chronic obstructive pulmonary disease, unspecified: Secondary | ICD-10-CM | POA: Diagnosis not present

## 2022-09-03 DIAGNOSIS — I160 Hypertensive urgency: Secondary | ICD-10-CM | POA: Diagnosis not present

## 2022-09-03 DIAGNOSIS — I161 Hypertensive emergency: Secondary | ICD-10-CM | POA: Diagnosis present

## 2022-09-03 DIAGNOSIS — J440 Chronic obstructive pulmonary disease with acute lower respiratory infection: Secondary | ICD-10-CM | POA: Diagnosis not present

## 2022-09-03 DIAGNOSIS — G250 Essential tremor: Secondary | ICD-10-CM | POA: Diagnosis present

## 2022-09-03 DIAGNOSIS — J9601 Acute respiratory failure with hypoxia: Secondary | ICD-10-CM | POA: Diagnosis not present

## 2022-09-03 DIAGNOSIS — Z66 Do not resuscitate: Secondary | ICD-10-CM | POA: Diagnosis not present

## 2022-09-03 DIAGNOSIS — D631 Anemia in chronic kidney disease: Secondary | ICD-10-CM | POA: Diagnosis present

## 2022-09-03 DIAGNOSIS — R0902 Hypoxemia: Secondary | ICD-10-CM | POA: Diagnosis not present

## 2022-09-03 DIAGNOSIS — R64 Cachexia: Secondary | ICD-10-CM | POA: Diagnosis present

## 2022-09-03 DIAGNOSIS — J189 Pneumonia, unspecified organism: Secondary | ICD-10-CM | POA: Diagnosis present

## 2022-09-03 DIAGNOSIS — D689 Coagulation defect, unspecified: Secondary | ICD-10-CM | POA: Diagnosis not present

## 2022-09-03 DIAGNOSIS — R0600 Dyspnea, unspecified: Secondary | ICD-10-CM | POA: Diagnosis present

## 2022-09-03 DIAGNOSIS — I110 Hypertensive heart disease with heart failure: Secondary | ICD-10-CM

## 2022-09-03 DIAGNOSIS — F331 Major depressive disorder, recurrent, moderate: Secondary | ICD-10-CM | POA: Diagnosis present

## 2022-09-03 DIAGNOSIS — I132 Hypertensive heart and chronic kidney disease with heart failure and with stage 5 chronic kidney disease, or end stage renal disease: Secondary | ICD-10-CM | POA: Diagnosis present

## 2022-09-03 DIAGNOSIS — D6832 Hemorrhagic disorder due to extrinsic circulating anticoagulants: Secondary | ICD-10-CM | POA: Diagnosis not present

## 2022-09-03 DIAGNOSIS — N179 Acute kidney failure, unspecified: Secondary | ICD-10-CM | POA: Diagnosis not present

## 2022-09-03 DIAGNOSIS — J9501 Hemorrhage from tracheostomy stoma: Secondary | ICD-10-CM | POA: Diagnosis not present

## 2022-09-03 DIAGNOSIS — I739 Peripheral vascular disease, unspecified: Secondary | ICD-10-CM | POA: Diagnosis present

## 2022-09-03 DIAGNOSIS — I4892 Unspecified atrial flutter: Secondary | ICD-10-CM | POA: Diagnosis present

## 2022-09-03 DIAGNOSIS — E039 Hypothyroidism, unspecified: Secondary | ICD-10-CM | POA: Diagnosis present

## 2022-09-03 DIAGNOSIS — R0602 Shortness of breath: Secondary | ICD-10-CM | POA: Diagnosis not present

## 2022-09-03 DIAGNOSIS — I5033 Acute on chronic diastolic (congestive) heart failure: Secondary | ICD-10-CM | POA: Diagnosis present

## 2022-09-03 DIAGNOSIS — I2489 Other forms of acute ischemic heart disease: Secondary | ICD-10-CM | POA: Diagnosis present

## 2022-09-03 DIAGNOSIS — R059 Cough, unspecified: Secondary | ICD-10-CM | POA: Diagnosis not present

## 2022-09-03 LAB — COMPREHENSIVE METABOLIC PANEL
ALT: 12 U/L (ref 0–44)
AST: 15 U/L (ref 15–41)
Albumin: 2.4 g/dL — ABNORMAL LOW (ref 3.5–5.0)
Alkaline Phosphatase: 65 U/L (ref 38–126)
Anion gap: 10 (ref 5–15)
BUN: 59 mg/dL — ABNORMAL HIGH (ref 8–23)
CO2: 18 mmol/L — ABNORMAL LOW (ref 22–32)
Calcium: 8.2 mg/dL — ABNORMAL LOW (ref 8.9–10.3)
Chloride: 114 mmol/L — ABNORMAL HIGH (ref 98–111)
Creatinine, Ser: 3.68 mg/dL — ABNORMAL HIGH (ref 0.44–1.00)
GFR, Estimated: 12 mL/min — ABNORMAL LOW (ref >60)
Glucose, Bld: 109 mg/dL — ABNORMAL HIGH (ref 70–99)
Potassium: 4.4 mmol/L (ref 3.5–5.1)
Sodium: 142 mmol/L (ref 135–145)
Total Bilirubin: 0.4 mg/dL (ref 0.3–1.2)
Total Protein: 5.3 g/dL — ABNORMAL LOW (ref 6.5–8.1)

## 2022-09-03 LAB — CBC
HCT: 24.6 % — ABNORMAL LOW (ref 36.0–46.0)
Hemoglobin: 7.7 g/dL — ABNORMAL LOW (ref 12.0–15.0)
MCH: 28.3 pg (ref 26.0–34.0)
MCHC: 31.3 g/dL (ref 30.0–36.0)
MCV: 90.4 fL (ref 80.0–100.0)
Platelets: 172 10*3/uL (ref 150–400)
RBC: 2.72 MIL/uL — ABNORMAL LOW (ref 3.87–5.11)
RDW: 17.7 % — ABNORMAL HIGH (ref 11.5–15.5)
WBC: 8.4 10*3/uL (ref 4.0–10.5)
nRBC: 0 % (ref 0.0–0.2)

## 2022-09-03 LAB — MAGNESIUM: Magnesium: 1.6 mg/dL — ABNORMAL LOW (ref 1.7–2.4)

## 2022-09-03 MED ORDER — FUROSEMIDE 40 MG PO TABS
40.0000 mg | ORAL_TABLET | Freq: Every day | ORAL | Status: DC
  Administered 2022-09-04 – 2022-09-06 (×3): 40 mg via ORAL
  Filled 2022-09-03 (×3): qty 1

## 2022-09-03 MED ORDER — GUAIFENESIN 100 MG/5ML PO LIQD
5.0000 mL | ORAL | Status: DC | PRN
  Administered 2022-09-04 – 2022-09-07 (×4): 5 mL via ORAL
  Filled 2022-09-03 (×5): qty 10

## 2022-09-03 MED ORDER — MELATONIN 5 MG PO TABS
5.0000 mg | ORAL_TABLET | Freq: Every evening | ORAL | Status: DC | PRN
  Administered 2022-09-04 – 2022-09-13 (×8): 5 mg via ORAL
  Filled 2022-09-03 (×9): qty 1

## 2022-09-03 MED ORDER — PANTOPRAZOLE SODIUM 40 MG PO TBEC
40.0000 mg | DELAYED_RELEASE_TABLET | Freq: Every day | ORAL | Status: DC
  Administered 2022-09-03 – 2022-09-09 (×6): 40 mg via ORAL
  Filled 2022-09-03 (×7): qty 1

## 2022-09-03 MED ORDER — APIXABAN 2.5 MG PO TABS
2.5000 mg | ORAL_TABLET | Freq: Two times a day (BID) | ORAL | Status: DC
  Administered 2022-09-03 – 2022-09-06 (×8): 2.5 mg via ORAL
  Filled 2022-09-03 (×8): qty 1

## 2022-09-03 MED ORDER — MAGNESIUM SULFATE 2 GM/50ML IV SOLN
2.0000 g | Freq: Once | INTRAVENOUS | Status: AC
  Administered 2022-09-03: 2 g via INTRAVENOUS
  Filled 2022-09-03: qty 50

## 2022-09-03 MED ORDER — LOPERAMIDE HCL 2 MG PO CAPS
2.0000 mg | ORAL_CAPSULE | ORAL | Status: DC | PRN
  Administered 2022-09-03 – 2022-09-07 (×2): 2 mg via ORAL
  Filled 2022-09-03 (×2): qty 1

## 2022-09-03 NOTE — Progress Notes (Signed)
PT is sleeping at this time. Vitals charted on Flowsheet. Son is at bedside. RN aware.

## 2022-09-03 NOTE — Consult Note (Signed)
Cardiology Consultation   Patient ID: [name] MRN: [id number]; DOB: 1946/07/20  Admit date: 09/02/2022 Date of Consult: 09/03/2022  PCP:  Hoyt Koch, MD   [facility] Cardiologist:  Mertie Moores, MD    Patient Profile:   [name] is a 76 y.o. female with a hx of history of head/neck cancer s/p laryngectomy with stoma,, atrial fibrillation, HTN, anxiety, depression, HLD, PAD, COPD, chronic pain, dysphagia, CKD stage IV, GERD, hypothyroidism, who is being seen 09/03/2022 for the evaluation of Htn emergency, CHF at the request of Dr. Eliseo Squires.  History of Present Illness:   [name] is a 76 year old female with above medical history who is followed by Dr. Acie Fredrickson.  Per chart review, patient established care with [facility] during an admission in 12/2020.  At that time, patient presented to the hospital complaining of shaking, weakness, fatigue, near syncope.  Found to have elevated troponins.  Cardiogram on 12/27/2020 showed EF 55-60%, no regional wall motion abnormalities, normal RV systolic function.Plan to do a nuclear stress test that admission, however it was called off due to high blood pressure.  Consider doing cardiac catheterization, but family was concerned about risk of AKI and decided not to move forward with study.  She was medically managed.  It was noted that patient had an episode of atrial fibrillation that admission, was treated with Eliquis, beta-blocker.  Patient was most recently seen by cardiology during a hospital admission for hypertensive emergency in 12/2021.  At that time, patient presented complaining of headache, tremors, blurry vision.  Was found to have BP of 247/114 with EMS and was brought to the ED for further evaluation.  Patient was found to have elevated troponins, thought to be related to hypertensive emergency.  Echocardiogram on 01/02/2022 showed EF 60-65%, mild LVH, grade 1 diastolic dysfunction, normal  RV systolic function, moderately dilated left atrium.  For further evaluation of uncontrolled hypertension, patient underwent a renal artery ultrasound on 01/06/2022 that showed no evidence of right renal artery stenosis, 1-59% stenosis of the left renal artery.  Patient was most recently admitted to the hospital from 08/06/2022 through 08/08/2022 for treatment of shortness of breath.  Patient had elevated troponins to 1200>>870 that admission.  Echocardiogram on 08/07/2022 showed EF>75%, moderate LVH, grade 1 diastolic dysfunction, normal RV systolic function, moderately dilated left atrium.  Noted that patient had poorly controlled blood pressure that admission and was discharged on carvedilol 25 mg twice daily, Imdur 60 mg daily, hydralazine 100 mg 3 times daily, Lasix 20 mg daily as needed.  Patient presented toThe ED on 9/20 complaining of generalized weakness that started earlier that day.  Also complained of shortness of breath, lower extremity edema, puffy face.  Blood pressure was initially in the 230s, decreased down to the 140s after IV hydralazine. Labs in the ED showed Na 142, K 4.9, creatinine 3.56, albumin 2.7, WBC 9.6, hemoglobin 8.2, platelets 170. BNP elevated to 1263.5. hsTn 1079>>891.   CXR showed mild cardiomegaly with vascular congestion and edema. EKG showed sinus rhythm, heart rate 78 bpm.  Prolonged QT interval (QT/QTc 441/503)  Interview was somewhat limited as patient has a stoma.  However, daughter-in-law at bedside was able to provide some history.  Daughter-in-law reports that the patient had been increasingly weak for the past few days. Patient has also developed worsening shortness of breath, orthopnea.  Patient denied any chest pain, but did feel some mild chest pressure/tightness when trying to breathe.  Daughter in  law had noted ankle edema, facial swelling.  Patient denied feeling nauseous, coughing.  She does feel some palpitations in her chest, feels like her heart is going  fast.  Daughter-in-law is concerned that patient has not been taking her medicines as prescribed at home.  The patient is responsible for managing her medicines herself, but daughter-in-law noticed is that some medicines are missing from her weekly med box.  Also has found pills on the ground   Past Medical History:  Diagnosis Date   Anemia    Anxiety    takes Ativan prn   Blood transfusion without reported diagnosis 09/15/12   2 units Prbc's   Broken ribs    Chronic back pain    CKD (chronic kidney disease), stage IV (HCC)    Constipation    related to pain meds   COPD (chronic obstructive pulmonary disease) ([place]) 08/10/2012   denies   Depression    Gastrostomy in place [facility])    removed   GERD (gastroesophageal reflux disease)    takes Zantac daily   Headache(784.0)    Hiatal hernia 08/10/2012   History of radiation therapy 10/17/12-11/25/12   supraglottic larynx,high risk neck tumor bed 5880 cGy/28 sessions, high risk lymph node tumor bed 5600 cGy/20 sessions, mod risk lymph node tumor bed 5040 cGy/20 sessions   Hypercholesteremia    takes Pravastatin daily   Hypertension    takes Tribenzor and Atenolol daily   Insomnia    takes Amitriptyline daily   Nausea    takes Zofran prn   PAD (peripheral artery disease) ([place])    noninvasive imaging in 2016   Pneumonia    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 no residual   Uterine cancer [facility])     Past Surgical History:  Procedure Laterality Date   ABDOMINAL SURGERY     r/t uterine carcinoma   APPENDECTOMY     DIRECT LARYNGOSCOPY N/A 05/22/2014   Procedure: DIRECT LARYNGOSCOPY WITH ESOPHAGEAL DILATION;  Surgeon: Jerrell Belfast, MD;  Location: [facility] OR;  Service: ENT;  Laterality: N/A;   ESOPHAGOSCOPY WITH DILITATION N/A 05/29/2015   Procedure: ESOPHAGOSCOPY WITH ESOPHAGEAL DILITATION;  Surgeon: Jerrell Belfast, MD;  Location: [place];  Service: ENT;   Laterality: N/A;   Gastrostomy Tube removed   2013   GASTROSTOMY W/ FEEDING TUBE  13   HERNIA REPAIR     child   LARYNGETOMY  08/31/2012   Procedure: LARYNGECTOMY;  Surgeon: Jerrell Belfast, MD;  Location: [place];  Service: ENT;  Laterality: N/A;   LARYNGOSCOPY  08/10/2012   Procedure: LARYNGOSCOPY;  Surgeon: Jerrell Belfast, MD;  Location: [facility];  Service: ENT;  Laterality: N/A;  with biopsy   RADICAL NECK DISSECTION  08/31/2012   Procedure: RADICAL NECK DISSECTION;  Surgeon: Jerrell Belfast, MD;  Location: [place];  Service: ENT;  Laterality: Bilateral;   TRACHEAL ESOPHOGEAL PUNCTURE WITH REPAIR STOMA N/A 09/08/2013   Procedure: TRACHEAL ESOPHOGEAL PUNCTURE WITH PLACEMENT OF  PROVOX PROSTHESIS ;  Surgeon: Jerrell Belfast, MD;  Location: [place];  Service: ENT;  Laterality: N/A;   TRACHEOSTOMY TUBE PLACEMENT  08/10/2012   Procedure: TRACHEOSTOMY;  Surgeon: Jerrell Belfast, MD;  Location: [facility];  Service: ENT;  Laterality: N/A;     Home Medications:  Prior to Admission medications   Medication Sig Start Date End Date Taking? Authorizing Provider  acetaminophen (TYLENOL) 500 MG tablet Take 1,000 mg by  mouth as needed for headache.   Yes [provider]  apixaban (ELIQUIS) 2.5 MG TABS tablet Take 1 tablet (2.5 mg total) by mouth 2 (two) times daily. 02/10/22  Yes Hoyt Koch, MD  carvedilol (COREG) 25 MG tablet TAKE 1 TABLET (25 MG TOTAL) BY MOUTH TWICE A DAY WITH MEALS Patient taking differently: Take 25 mg by mouth 2 (two) times daily with a meal. 05/26/22  Yes Hoyt Koch, MD  cilostazol (PLETAL) 100 MG tablet TAKE 1 TABLET BY MOUTH TWICE A DAY Patient taking differently: Take 100 mg by mouth 2 (two) times daily. 02/03/22  Yes Hoyt Koch, MD  docusate sodium (COLACE) 100 MG capsule Take 1 capsule (100 mg total) by mouth daily. 01/09/22  Yes Little Ishikawa, MD  DULoxetine (CYMBALTA) 20 MG capsule TAKE 1 CAPSULE BY MOUTH EVERY DAY Patient taking  differently: Take 20 mg by mouth daily. 07/23/22  Yes Hoyt Koch, MD  fluticasone (FLONASE) 50 MCG/ACT nasal spray SPRAY 2 SPRAYS INTO EACH NOSTRIL EVERY DAY Patient taking differently: Place 2 sprays into both nostrils daily. 01/31/22  Yes Janith Lima, MD  furosemide (LASIX) 20 MG tablet Take 1 tablet (20 mg total) by mouth daily as needed. 05/20/22  Yes Hoyt Koch, MD  hydrALAZINE (APRESOLINE) 100 MG tablet Take 1 tablet (100 mg total) by mouth 3 (three) times daily. 03/03/22  Yes Hoyt Koch, MD  isosorbide mononitrate (IMDUR) 60 MG 24 hr tablet TAKE 1 TABLET BY MOUTH EVERY DAY Patient taking differently: Take 60 mg by mouth daily. 07/29/22  Yes Hoyt Koch, MD  levothyroxine (SYNTHROID) 50 MCG tablet TAKE 1 TABLET BY MOUTH EVERY DAY BEFORE BREAKFAST Patient taking differently: Take 50 mcg by mouth daily before breakfast. 04/30/22  Yes Hoyt Koch, MD  loperamide (IMODIUM A-D) 2 MG tablet Take 2 mg by mouth as needed for diarrhea or loose stools.   Yes [provider]  LORazepam (ATIVAN) 0.5 MG tablet Take 0.5 mg by mouth 2 (two) times daily. 08/22/22  Yes [provider]  LORazepam (ATIVAN) 1 MG tablet Take 1 tablet (1 mg total) by mouth 2 (two) times daily as needed for anxiety. 08/13/22  Yes Hoyt Koch, MD  mirtazapine (REMERON) 15 MG tablet Take 15 mg by mouth at bedtime.   Yes [provider]  ondansetron (ZOFRAN) 4 MG tablet TAKE 1 TABLET BY MOUTH EVERY 8 HOURS AS NEEDED FOR NAUSEA AND VOMITING Patient taking differently: Take 4 mg by mouth as needed for nausea or vomiting. 02/03/22  Yes Hoyt Koch, MD  rosuvastatin (CRESTOR) 20 MG tablet TAKE 1 TABLET (20 MG TOTAL) BY MOUTH DAILY. Patient taking differently: Take 20 mg by mouth at bedtime. 07/29/22  Yes Hoyt Koch, MD  amLODipine (NORVASC) 10 MG tablet Take 1 tablet (10 mg total) by mouth daily. Patient not taking: Reported on 09/02/2022  01/09/22   Little Ishikawa, MD  Ensure (ENSURE) Take 237 mLs by mouth daily.    [provider]  predniSONE (DELTASONE) 20 MG tablet Days 1-7 take 2 pills daily. Days 8-14 take 1 pill daily Patient not taking: Reported on 09/02/2022 08/13/22   Hoyt Koch, MD  levocetirizine (XYZAL) 5 MG tablet TAKE 1 TABLET(5 MG) BY MOUTH EVERY EVENING 02/21/20 06/10/20  Janith Lima, MD    Inpatient Medications: Scheduled Meds:  apixaban  2.5 mg Oral BID   carvedilol  25 mg Oral BID WC   cilostazol  100 mg Oral BID   DULoxetine  20 mg Oral Daily   furosemide  40 mg Intravenous BID   hydrALAZINE  100 mg Oral TID   isosorbide mononitrate  60 mg Oral Daily   levothyroxine  50 mcg Oral Q0600   LORazepam  0.5 mg Oral BID   mirtazapine  15 mg Oral QHS   rosuvastatin  20 mg Oral QHS   sodium chloride flush  3 mL Intravenous Q12H   Continuous Infusions:  PRN Meds: acetaminophen **OR** acetaminophen, labetalol, polyethylene glycol  Allergies:    Allergies  Allergen Reactions   Xyzal [Levocetirizine Dihydrochloride] Itching   Augmentin [Amoxicillin-Pot Clavulanate] Diarrhea and Nausea And Vomiting   Tribenzor [Olmesartan-Amlodipine-Hctz] Other (See Comments)    "Hurt the kidneys"    Social History:   Social History   Socioeconomic History   Marital status: Widowed    Spouse name: Not on file   Number of children: 4   Years of education: Not on file   Highest education level: Not on file  Occupational History    Comment: retired Regulatory affairs officer  Tobacco Use   Smoking status: Former    Packs/day: 0.25    Years: 50.00    Total pack years: 12.50    Types: Cigarettes    Quit date: 05/27/2013    Years since quitting: 9.2   Smokeless tobacco: Never  Vaping Use   Vaping Use: Never used  Substance and Sexual Activity   Alcohol use: Yes   Drug use: No   Sexual activity: Not Currently  Other Topics Concern   Not on file  Social History Narrative   ** Merged History  Encounter **       Social Determinants of Health   Financial Resource Strain: Not on file  Food Insecurity: No Food Insecurity (09/03/2022)   Hunger Vital Sign    Worried About Running Out of Food in the Last Year: Never true    Ran Out of Food in the Last Year: Never true  Transportation Needs: No Transportation Needs (09/03/2022)   PRAPARE - Hydrologist (Medical): No    Lack of Transportation (Non-Medical): No  Physical Activity: Not on file  Stress: Not on file  Social Connections: Socially Isolated (01/16/2021)   Social Connection and Isolation Panel [NHANES]    Frequency of Communication with Friends and Family: Once a week    Frequency of Social Gatherings with Friends and Family: Once a week    Attends Religious Services: More than 4 times per year    Active Member of [employer] or Organizations: No    Attends Archivist Meetings: Never    Marital Status: Widowed  Intimate Partner Violence: Unknown (09/03/2022)   Humiliation, Afraid, Rape, and Kick questionnaire    Fear of Current or Ex-Partner: Patient refused    Emotionally Abused: Patient refused    Physically Abused: Patient refused    Sexually Abused: Patient refused    Family History:    Family History  Problem Relation Age of Onset   Throat cancer Father    Brain cancer Brother    CAD Neg Hx      ROS:  Please see the history of present illness.   All other ROS reviewed and negative.     Physical Exam/Data:   Vitals:   09/03/22 0414 09/03/22 0538 09/03/22 0801 09/03/22 0816  BP: (!) 162/80  (!) 166/79   Pulse: (!) 121  (!) 115 (!) 114  Resp:  $'20  20 19  'V$ Temp: 99.1 F (37.3 C)  99.1 F (37.3 C)   TempSrc: Oral  Oral   SpO2: 100%  100% 99%  Weight:  53.7 kg    Height:        Intake/Output Summary (Last 24 hours) at 09/03/2022 0915 Last data filed at 09/03/2022 0600 Gross per 24 hour  Intake --  Output 600 ml  Net -600 ml      09/03/2022    5:38 AM 09/03/2022    12:27 AM 08/13/2022    9:52 AM  Last 3 Weights  Weight (lbs) 118 lb 6.2 oz 118 lb 9.7 oz 115 lb  Weight (kg) 53.7 kg 53.8 kg 52.164 kg     Body mass index is 20.97 kg/m.  General: Chronically ill appearing elderly female, sitting upright in the bed in no acute distress  HEENT: Stoma present, wearing tracheostomy collar  Neck: no JVD Vascular: Radial pulses 2+ bilaterally Cardiac:  normal S1, S2; Regular rhythm, mildly tachycardic; no murmur Lungs:  Diminished breath sounds in bilateral lower lungs, end expiratory wheezes present throughout. Wearing tracheostomy collar with 5 L supplemental oxygen  Abd: soft, nontender, no hepatomegaly  Ext: no edema Musculoskeletal:  No deformities, BUE and BLE strength normal and equal Skin: warm and dry  Neuro:  CNs 2-12 intact, no focal abnormalities noted Psych:  Normal affect   EKG:  The EKG was personally reviewed and demonstrates:  sinus rhythm, heart rate 78 bpm.  Prolonged QT interval (QT/QTc 441/503) Telemetry:  Telemetry was personally reviewed and demonstrates: sinus tachycardia with HR in the 110s. PACs present   Relevant CV Studies:  Echocardiogram 08/07/2022  1. Left ventricular ejection fraction, by estimation, is >75%. The left  ventricle has hyperdynamic function. The left ventricle has no regional  wall motion abnormalities. There is moderate concentric left ventricular  hypertrophy. Left ventricular  diastolic parameters are consistent with Grade I diastolic dysfunction  (impaired relaxation).   2. Right ventricular systolic function is normal. The right ventricular  size is normal. There is normal pulmonary artery systolic pressure.   3. Left atrial size was moderately dilated.   4. The mitral valve is normal in structure. No evidence of mitral valve  regurgitation. No evidence of mitral stenosis.   5. The aortic valve is tricuspid. Aortic valve regurgitation is not  visualized. No aortic stenosis is present.   6. The  inferior vena cava is normal in size with <50% respiratory  variability, suggesting right atrial pressure of 8 mmHg.   Laboratory Data:  High Sensitivity Troponin:   Recent Labs  Lab 08/07/22 0506 08/07/22 0626 08/08/22 0318 09/02/22 1827 09/02/22 2030  TROPONINIHS 1,095* 1,217* 870* 1,079* 891*     Chemistry Recent Labs  Lab 09/02/22 1827 09/03/22 0154  NA 142 142  K 4.9 4.4  CL 115* 114*  CO2 19* 18*  GLUCOSE 100* 109*  BUN 54* 59*  CREATININE 3.56* 3.68*  CALCIUM 8.6* 8.2*  MG  --  1.6*  GFRNONAA 13* 12*  ANIONGAP 8 10    Recent Labs  Lab 09/02/22 1827 09/03/22 0154  PROT 5.6* 5.3*  ALBUMIN 2.7* 2.4*  AST 16 15  ALT 12 12  ALKPHOS 71 65  BILITOT 0.5 0.4   Lipids No results for input(s): "CHOL", "TRIG", "HDL", "LABVLDL", "LDLCALC", "CHOLHDL" in the last 168 hours.  Hematology Recent Labs  Lab 09/02/22 1827 09/03/22 0154  WBC 9.6 8.4  RBC 3.02* 2.72*  HGB 8.2* 7.7*  HCT  27.0* 24.6*  MCV 89.4 90.4  MCH 27.2 28.3  MCHC 30.4 31.3  RDW 17.7* 17.7*  PLT 170 172   Thyroid No results for input(s): "TSH", "FREET4" in the last 168 hours.  BNP Recent Labs  Lab 09/02/22 1827  BNP 1,263.5*    DDimer No results for input(s): "DDIMER" in the last 168 hours.   Radiology/Studies:  CT Cervical Spine Wo Contrast  Result Date: 09/02/2022 CLINICAL DATA:  Dizziness and weakness. EXAM: CT CERVICAL SPINE WITHOUT CONTRAST TECHNIQUE: Multidetector CT imaging of the cervical spine was performed without intravenous contrast. Multiplanar CT image reconstructions were also generated. RADIATION DOSE REDUCTION: This exam was performed according to the departmental dose-optimization program which includes automated exposure control, adjustment of the mA and/or kV according to patient size and/or use of iterative reconstruction technique. COMPARISON:  June 14, 2021 FINDINGS: Alignment: There is straightening of the normal cervical spine lordosis. Skull base and vertebrae: No  acute fracture. No primary bone lesion or focal pathologic process. Soft tissues and spinal canal: Prominent laryngeal soft tissues are seen at the level of C2-C3 (sagittal reformatted images 48 through 53, CT series 7). This is seen on the prior study and is more prominent on the current exam. Disc levels: Marked severity endplate sclerosis, mild to moderate severity anterior osteophyte formation and posterior bony spurring are seen at the levels of C5-C6 and C6-C7. There is marked severity narrowing of the anterior atlantoaxial articulation. Marked severity intervertebral disc space narrowing is seen at C5-C6, C6-C7 and C7-T1. Bilateral moderate to marked severity multilevel facet joint hypertrophy is noted. Upper chest: Mild to moderate severity biapical scarring and/or atelectasis is seen. Other: None. IMPRESSION: 1. Marked severity multilevel degenerative changes without evidence of an acute fracture or subluxation. 2. Prominent laryngeal soft tissues which corresponds to the patient's history of laryngeal cancer. Further evaluation with contrast enhanced MRI of the neck is recommended to exclude the presence of local recurrence. 3. Mild to moderate severity biapical scarring and/or atelectasis. Electronically Signed   By: Virgina Norfolk M.D.   On: 09/02/2022 19:56   CT HEAD WO CONTRAST (5MM)  Result Date: 09/02/2022 CLINICAL DATA:  Generalized weakness and dizziness. EXAM: CT HEAD WITHOUT CONTRAST TECHNIQUE: Contiguous axial images were obtained from the base of the skull through the vertex without intravenous contrast. RADIATION DOSE REDUCTION: This exam was performed according to the departmental dose-optimization program which includes automated exposure control, adjustment of the mA and/or kV according to patient size and/or use of iterative reconstruction technique. COMPARISON:  August 06, 2022 FINDINGS: Brain: There is mild cerebral atrophy with widening of the extra-axial spaces and ventricular  dilatation. There are areas of decreased attenuation within the white matter tracts of the supratentorial brain, consistent with microvascular disease changes. Vascular: No hyperdense vessel or unexpected calcification. Skull: Normal. Negative for fracture or focal lesion. Sinuses/Orbits: No acute finding. Other: None. IMPRESSION: No acute intracranial abnormality. Electronically Signed   By: Virgina Norfolk M.D.   On: 09/02/2022 19:44   DG Chest Port 1 View  Result Date: 09/02/2022 CLINICAL DATA:  Weakness. EXAM: PORTABLE CHEST 1 VIEW COMPARISON:  Chest CT dated 08/06/2022. FINDINGS: There is mild cardiomegaly with vascular congestion and edema. Right perihilar rounded density may represent edema or developing pneumonia. Clinical correlation and follow-up to resolution recommended. No pleural effusion or pneumothorax. Atherosclerotic calcification of the aorta. No acute osseous pathology. IMPRESSION: 1. Mild cardiomegaly with vascular congestion and edema. 2. Right perihilar rounded density may represent edema or developing pneumonia.  Electronically Signed   By: Anner Crete M.D.   On: 09/02/2022 19:16     Assessment and Plan:   Acute on chronic diastolic heart failure  - Most recent echocardiogram from 08/07/2022 showed EF greater than 75%, moderate LVH with grade 1 diastolic dysfunction - Now, patient presenting complaining of shortness of breath, ankle edema in the setting of hypertensive emergency.  BNP elevated to 1263 (new).  Chest x-ray with vascular congestion and edema - So far, patient has received 1 dose of IV lasix 40 mg. Output 0.6 L urine overnight.  - As patient had good urine output with 40 mg IV lasix, agree with continuing IV lasix 40 mg BID  - Strict I/Os, daily weights  - Manage blood pressure   HTN emergency  -BP was elevated to the 230s on admission.  Did improve down to the 140s after IV hydralazine -Suspect hypertensive emergency is occurring in the setting of  medication noncompliance.  Daughter-in-law reported that patient is responsible for taking her own medicines, but DIL has noticed medicines missing from her med box/cabinet. Has also found medication on the ground  - As patient had poor compliance with medication at home, it is reasonable to resume home medications for now and titrate as needed.  - Continue carvedilol 25 mg BID - Continue hydralazine 100 mg TID  - Continue Imdur 60 mg daily, can further titrate as needed   Elevated troponin  - hstn 1079>>891. Denies chest pain  - Note that during past admissions with HTN emergency, she has had similar trop elevation. (1475 in 12/2021, 1217 in 07/2022) - Suspect trop elevation is demand ischemia in the setting of severely elevated troponin. Patient not a candidate for cath due to renal function - As trop is downtrending, no need for IV heparin at this time   Paroxysmal Atrial Fibrillation  - Per telemetry, patient did have a brief episode of afib with RVR overnight. However, she is predominantly in sinus tachycardia with HR in the 100s-110s. Patient just received carvedilol this AM - Follow HR after administration of carvedilol  - Continue eliquis 2.5 mg BID. Note-- patient is having stoma stretched in October and has been not taking her eliquis for a few weeks in preparation. As her procedure is not until October, this is too long to hold eliquis and she should take while admitted.    Risk Assessment/Risk Scores:    New York Heart Association (NYHA) Functional Class NYHA Class III  CHA2DS2-VASc Score = 8  This indicates a 10.8% annual risk of stroke. The patient's score is based upon: CHF History: 1 HTN History: 1 Diabetes History: 0 Stroke History: 2 Vascular Disease History: 1 Age Score: 2 Gender Score: 1     For questions or updates, please contact [place] Please consult [email or web address] for contact info under    Signed, Margie Billet, PA-C  09/03/2022 9:15  AM

## 2022-09-03 NOTE — Evaluation (Addendum)
SLP Cancellation Note  Patient Details Name: [name] MRN: [id number] DOB: 07/22/1946   Cancelled treatment:       Reason Eval/Treat Not Completed: Other (comment) (Pt is s/p laryngectomy thus unable to benefit from PMSV, She is followed at [facility] for TEP management for alaryngeal voicing abilities. She was last seen at [facility] 9/11 per notes.  SLP able to assist with establishing nonverbal communication with pt while in hospital if MD desire.)  Kathleen Lime, MS [facility] SLP [place] Office [phone] Pager [phone]  Macario Golds 09/03/2022, 7:13 AM

## 2022-09-03 NOTE — Progress Notes (Signed)
Rapid Response Event Note   Reason for Call : CN asked for me to see the patient d/t high BP's.   Initial Focused Assessment: Patient alert and oriented to person, place, time and situation.   Patient had no c/o of respiratory distress or pain at this time.  Interventions: Patient already given bedtime blood pressure and anxiety medications.   BP 209/81(118)  Plan of Care: Recheck of VS per protocol and given additional prn BP medication if need be.  Event Summary: Charge Nurse Made aware and action plan given to Primary RN  MD Notified: N/A still a green MEWs Call Time: 2115 Arrival Time: 2115 End Time: 2130

## 2022-09-03 NOTE — Progress Notes (Signed)
PROGRESS NOTE    [name]  NOM:[id number] DOB: 1946-05-04 DOA: 09/02/2022 PCP: Hoyt Koch, MD    Brief Narrative:  [name] is a 76 y.o. female with medical history significant of atrial fibrillation, hypertension, anxiety, depression, hyperlipidemia, PAD, COPD, chronic pain, dysphagia, CKD 4, GERD, hypothyroidism, history of head neck cancer status post laryngectomy, anemia, CVA presenting with ongoing shortness of breath and weakness    Assessment and Plan: Acute on chronic diastolic CHF Hypertensive emergency Demand ischemia > Patient presenting with ongoing shortness breath and weakness present since recent admission around a month ago.  No obvious cause at that time other than her hypertension. > On this presentation patient noted to have newly elevated BNP to 1263.  Also similarly elevated troponin at 1079 and then improved to 890 on repeat. > Also significantly hypertensive on presentation to 962E systolic did not respond to initial IV hydralazine in the ED and on second dose reduced to the 140s. > Presentation consistent with hypertensive emergency/CHF exacerbation.  BNP points for CHF and this is new.  Demand ischemia is a likely etiology for troponin which is intermittently elevated and is without chest pain.  No indication for heparin at this time per cardiology was consulted in the ED. -cards consult -per daughter in law may not have been taking her   A-fib - Continue home carvedilol and Eliquis -elquis to be held 3 days prior to procedure    Hyperlipidemia PAD - Continue home rosuvastatin, cilostazol   CKD 4 > Creatinine stable at 3.56 - Trend renal function and electrolytes - Avoid nephrotoxic agents when able   Anemia-unclear etiology -outpatient follow up- prob CKD > Hemoglobin stable at 8.2    Hypothyroidism - Continue home Synthroid   Hypomagnesemia -replete  Anxiety Depression - Continue home fluoxetine, Remeron,  Ativan -daughter in law states she had been taking more ativan than prescribed   COPD > History of this however do not see any inhalers in chart.   History of head/neck cancer  > Status post laryngectomy with trach in place. - Noted -due to have stoma stretched in October-- I reached out to [facility] and she needs to hold her eliquis 3 days prior to procedure    DVT prophylaxis: apixaban (ELIQUIS) tablet 2.5 mg Start: 09/03/22 1200 apixaban (ELIQUIS) tablet 2.5 mg    Code Status: Full Code Family Communication: called daughter in law  Disposition Plan:  Level of care: Telemetry Status is: Observation The patient will require care spanning > 2 midnights and should be moved to inpatient     Consultants:  cards   Subjective: C/o mucous  Objective: Vitals:   09/03/22 0414 09/03/22 0538 09/03/22 0801 09/03/22 0816  BP: (!) 162/80  (!) 166/79   Pulse: (!) 121  (!) 115 (!) 114  Resp: '20  20 19  '$ Temp: 99.1 F (37.3 C)  99.1 F (37.3 C)   TempSrc: Oral  Oral   SpO2: 100%  100% 99%  Weight:  53.7 kg    Height:        Intake/Output Summary (Last 24 hours) at 09/03/2022 1057 Last data filed at 09/03/2022 1030 Gross per 24 hour  Intake 60 ml  Output 600 ml  Net -540 ml   Filed Weights   09/03/22 0027 09/03/22 0538  Weight: 53.8 kg 53.7 kg    Examination:   General: Appearance:    elderly female in no acute distress     Lungs:     respirations  unlabored  Heart:    Tachycardic. irregular   MS:   All extremities are intact.    Neurologic:   Awake, alert       Data Reviewed: I have personally reviewed following labs and imaging studies  CBC: Recent Labs  Lab 09/02/22 1827 09/03/22 0154  WBC 9.6 8.4  NEUTROABS 8.5*  --   HGB 8.2* 7.7*  HCT 27.0* 24.6*  MCV 89.4 90.4  PLT 170 254   Basic Metabolic Panel: Recent Labs  Lab 09/02/22 1827 09/03/22 0154  NA 142 142  K 4.9 4.4  CL 115* 114*  CO2 19* 18*  GLUCOSE 100* 109*  BUN 54* 59*  CREATININE 3.56*  3.68*  CALCIUM 8.6* 8.2*  MG  --  1.6*   GFR: Estimated Creatinine Clearance: 10.8 mL/min (A) (by C-G formula based on SCr of 3.68 mg/dL (H)). Liver Function Tests: Recent Labs  Lab 09/02/22 1827 09/03/22 0154  AST 16 15  ALT 12 12  ALKPHOS 71 65  BILITOT 0.5 0.4  PROT 5.6* 5.3*  ALBUMIN 2.7* 2.4*   No results for input(s): "LIPASE", "AMYLASE" in the last 168 hours. No results for input(s): "AMMONIA" in the last 168 hours. Coagulation Profile: No results for input(s): "INR", "PROTIME" in the last 168 hours. Cardiac Enzymes: No results for input(s): "CKTOTAL", "CKMB", "CKMBINDEX", "TROPONINI" in the last 168 hours. BNP (last 3 results) No results for input(s): "PROBNP" in the last 8760 hours. HbA1C: No results for input(s): "HGBA1C" in the last 72 hours. CBG: No results for input(s): "GLUCAP" in the last 168 hours. Lipid Profile: No results for input(s): "CHOL", "HDL", "LDLCALC", "TRIG", "CHOLHDL", "LDLDIRECT" in the last 72 hours. Thyroid Function Tests: No results for input(s): "TSH", "T4TOTAL", "FREET4", "T3FREE", "THYROIDAB" in the last 72 hours. Anemia Panel: No results for input(s): "VITAMINB12", "FOLATE", "FERRITIN", "TIBC", "IRON", "RETICCTPCT" in the last 72 hours. Sepsis Labs: No results for input(s): "PROCALCITON", "LATICACIDVEN" in the last 168 hours.  No results found for this or any previous visit (from the past 240 hour(s)).       Radiology Studies: CT Cervical Spine Wo Contrast  Result Date: 09/02/2022 CLINICAL DATA:  Dizziness and weakness. EXAM: CT CERVICAL SPINE WITHOUT CONTRAST TECHNIQUE: Multidetector CT imaging of the cervical spine was performed without intravenous contrast. Multiplanar CT image reconstructions were also generated. RADIATION DOSE REDUCTION: This exam was performed according to the departmental dose-optimization program which includes automated exposure control, adjustment of the mA and/or kV according to patient size and/or use of  iterative reconstruction technique. COMPARISON:  June 14, 2021 FINDINGS: Alignment: There is straightening of the normal cervical spine lordosis. Skull base and vertebrae: No acute fracture. No primary bone lesion or focal pathologic process. Soft tissues and spinal canal: Prominent laryngeal soft tissues are seen at the level of C2-C3 (sagittal reformatted images 48 through 53, CT series 7). This is seen on the prior study and is more prominent on the current exam. Disc levels: Marked severity endplate sclerosis, mild to moderate severity anterior osteophyte formation and posterior bony spurring are seen at the levels of C5-C6 and C6-C7. There is marked severity narrowing of the anterior atlantoaxial articulation. Marked severity intervertebral disc space narrowing is seen at C5-C6, C6-C7 and C7-T1. Bilateral moderate to marked severity multilevel facet joint hypertrophy is noted. Upper chest: Mild to moderate severity biapical scarring and/or atelectasis is seen. Other: None. IMPRESSION: 1. Marked severity multilevel degenerative changes without evidence of an acute fracture or subluxation. 2. Prominent laryngeal soft tissues which  corresponds to the patient's history of laryngeal cancer. Further evaluation with contrast enhanced MRI of the neck is recommended to exclude the presence of local recurrence. 3. Mild to moderate severity biapical scarring and/or atelectasis. Electronically Signed   By: Virgina Norfolk M.D.   On: 09/02/2022 19:56   CT HEAD WO CONTRAST (5MM)  Result Date: 09/02/2022 CLINICAL DATA:  Generalized weakness and dizziness. EXAM: CT HEAD WITHOUT CONTRAST TECHNIQUE: Contiguous axial images were obtained from the base of the skull through the vertex without intravenous contrast. RADIATION DOSE REDUCTION: This exam was performed according to the departmental dose-optimization program which includes automated exposure control, adjustment of the mA and/or kV according to patient size and/or use  of iterative reconstruction technique. COMPARISON:  August 06, 2022 FINDINGS: Brain: There is mild cerebral atrophy with widening of the extra-axial spaces and ventricular dilatation. There are areas of decreased attenuation within the white matter tracts of the supratentorial brain, consistent with microvascular disease changes. Vascular: No hyperdense vessel or unexpected calcification. Skull: Normal. Negative for fracture or focal lesion. Sinuses/Orbits: No acute finding. Other: None. IMPRESSION: No acute intracranial abnormality. Electronically Signed   By: Virgina Norfolk M.D.   On: 09/02/2022 19:44   DG Chest Port 1 View  Result Date: 09/02/2022 CLINICAL DATA:  Weakness. EXAM: PORTABLE CHEST 1 VIEW COMPARISON:  Chest CT dated 08/06/2022. FINDINGS: There is mild cardiomegaly with vascular congestion and edema. Right perihilar rounded density may represent edema or developing pneumonia. Clinical correlation and follow-up to resolution recommended. No pleural effusion or pneumothorax. Atherosclerotic calcification of the aorta. No acute osseous pathology. IMPRESSION: 1. Mild cardiomegaly with vascular congestion and edema. 2. Right perihilar rounded density may represent edema or developing pneumonia. Electronically Signed   By: Anner Crete M.D.   On: 09/02/2022 19:16        Scheduled Meds:  apixaban  2.5 mg Oral BID   carvedilol  25 mg Oral BID WC   cilostazol  100 mg Oral BID   DULoxetine  20 mg Oral Daily   furosemide  40 mg Oral Daily   hydrALAZINE  100 mg Oral TID   isosorbide mononitrate  60 mg Oral Daily   levothyroxine  50 mcg Oral Q0600   LORazepam  0.5 mg Oral BID   mirtazapine  15 mg Oral QHS   rosuvastatin  20 mg Oral QHS   sodium chloride flush  3 mL Intravenous Q12H   Continuous Infusions:   LOS: 0 days    Time spent: 45 minutes spent on chart review, discussion with nursing staff, consultants, updating family and interview/physical exam; more than 50% of that  time was spent in counseling and/or coordination of care.    Geradine Girt, DO [facility] Available via Epic secure chat 7am-7pm After these hours, please refer to coverage provider listed on [email or web address] 09/03/2022, 10:57 AM

## 2022-09-03 NOTE — Progress Notes (Deleted)
Subjective:    Patient ID: [name], female    DOB: 07-27-1946, 76 y.o.   MRN: [id number]      [place] is here for No chief complaint on file.    Eye problems -     Medications and allergies reviewed with patient and updated if appropriate.  Current Facility-Administered Medications on File Prior to Visit  Medication Dose Route Frequency Provider Last Rate Last Admin   acetaminophen (TYLENOL) tablet 650 mg  650 mg Oral Q6H PRN Marcelyn Bruins, MD   650 mg at 09/03/22 1219   Or   acetaminophen (TYLENOL) suppository 650 mg  650 mg Rectal Q6H PRN Marcelyn Bruins, MD       apixaban Arne Cleveland) tablet 2.5 mg  2.5 mg Oral BID Vann, Jessica U, DO   2.5 mg at 09/03/22 1108   carvedilol (COREG) tablet 25 mg  25 mg Oral BID WC Marcelyn Bruins, MD   25 mg at 09/03/22 1606   cilostazol (PLETAL) tablet 100 mg  100 mg Oral BID Marcelyn Bruins, MD   100 mg at 09/03/22 1000   DULoxetine (CYMBALTA) DR capsule 20 mg  20 mg Oral Daily Marcelyn Bruins, MD   20 mg at 09/03/22 1008   furosemide (LASIX) tablet 40 mg  40 mg Oral Daily Janina Mayo, MD       guaiFENesin (ROBITUSSIN) 100 MG/5ML liquid 5 mL  5 mL Oral Q4H PRN Eulogio Bear U, DO       hydrALAZINE (APRESOLINE) tablet 100 mg  100 mg Oral TID Marcelyn Bruins, MD   100 mg at 09/03/22 1606   isosorbide mononitrate (IMDUR) 24 hr tablet 60 mg  60 mg Oral Daily Marcelyn Bruins, MD   60 mg at 09/03/22 0959   labetalol (NORMODYNE) injection 5 mg  5 mg Intravenous Q2H PRN Marcelyn Bruins, MD       levothyroxine (SYNTHROID) tablet 50 mcg  50 mcg Oral Q0600 Marcelyn Bruins, MD   50 mcg at 09/03/22 0549   loperamide (IMODIUM) capsule 2 mg  2 mg Oral PRN Eulogio Bear U, DO   2 mg at 09/03/22 1433   LORazepam (ATIVAN) tablet 0.5 mg  0.5 mg Oral BID Marcelyn Bruins, MD   0.5 mg at 09/03/22 0959   mirtazapine (REMERON) tablet 15 mg  15 mg Oral QHS Marcelyn Bruins, MD   15 mg at 09/02/22 2309    pantoprazole (PROTONIX) EC tablet 40 mg  40 mg Oral Daily Eulogio Bear U, DO   40 mg at 09/03/22 1142   polyethylene glycol (MIRALAX / GLYCOLAX) packet 17 g  17 g Oral Daily PRN Marcelyn Bruins, MD       rosuvastatin (CRESTOR) tablet 20 mg  20 mg Oral QHS Marcelyn Bruins, MD   20 mg at 09/02/22 2311   sodium chloride flush (NS) 0.9 % injection 3 mL  3 mL Intravenous Q12H Marcelyn Bruins, MD   3 mL at 09/03/22 1004   Current Outpatient Medications on File Prior to Visit  Medication Sig Dispense Refill   acetaminophen (TYLENOL) 500 MG tablet Take 1,000 mg by mouth as needed for headache.     amLODipine (NORVASC) 10 MG tablet Take 1 tablet (10 mg total) by mouth daily. (Patient not taking: Reported on 09/02/2022) 30 tablet 0   apixaban (ELIQUIS) 2.5 MG TABS tablet Take 1 tablet (2.5 mg total) by mouth 2 (two) times  daily. 180 tablet 3   carvedilol (COREG) 25 MG tablet TAKE 1 TABLET (25 MG TOTAL) BY MOUTH TWICE A DAY WITH MEALS (Patient taking differently: Take 25 mg by mouth 2 (two) times daily with a meal.) 180 tablet 1   cilostazol (PLETAL) 100 MG tablet TAKE 1 TABLET BY MOUTH TWICE A DAY (Patient taking differently: Take 100 mg by mouth 2 (two) times daily.) 180 tablet 3   docusate sodium (COLACE) 100 MG capsule Take 1 capsule (100 mg total) by mouth daily. 10 capsule 0   DULoxetine (CYMBALTA) 20 MG capsule TAKE 1 CAPSULE BY MOUTH EVERY DAY (Patient taking differently: Take 20 mg by mouth daily.) 90 capsule 1   Ensure (ENSURE) Take 237 mLs by mouth daily.     fluticasone (FLONASE) 50 MCG/ACT nasal spray SPRAY 2 SPRAYS INTO EACH NOSTRIL EVERY DAY (Patient taking differently: Place 2 sprays into both nostrils daily.) 48 mL 2   furosemide (LASIX) 20 MG tablet Take 1 tablet (20 mg total) by mouth daily as needed. 90 tablet 0   hydrALAZINE (APRESOLINE) 100 MG tablet Take 1 tablet (100 mg total) by mouth 3 (three) times daily. 90 tablet 11   isosorbide mononitrate (IMDUR) 60 MG 24 hr tablet  TAKE 1 TABLET BY MOUTH EVERY DAY (Patient taking differently: Take 60 mg by mouth daily.) 90 tablet 0   levothyroxine (SYNTHROID) 50 MCG tablet TAKE 1 TABLET BY MOUTH EVERY DAY BEFORE BREAKFAST (Patient taking differently: Take 50 mcg by mouth daily before breakfast.) 90 tablet 3   loperamide (IMODIUM A-D) 2 MG tablet Take 2 mg by mouth as needed for diarrhea or loose stools.     LORazepam (ATIVAN) 0.5 MG tablet Take 0.5 mg by mouth 2 (two) times daily.     LORazepam (ATIVAN) 1 MG tablet Take 1 tablet (1 mg total) by mouth 2 (two) times daily as needed for anxiety. 60 tablet 5   mirtazapine (REMERON) 15 MG tablet Take 15 mg by mouth at bedtime.     ondansetron (ZOFRAN) 4 MG tablet TAKE 1 TABLET BY MOUTH EVERY 8 HOURS AS NEEDED FOR NAUSEA AND VOMITING (Patient taking differently: Take 4 mg by mouth as needed for nausea or vomiting.) 20 tablet 0   predniSONE (DELTASONE) 20 MG tablet Days 1-7 take 2 pills daily. Days 8-14 take 1 pill daily (Patient not taking: Reported on 09/02/2022) 25 tablet 0   rosuvastatin (CRESTOR) 20 MG tablet TAKE 1 TABLET (20 MG TOTAL) BY MOUTH DAILY. (Patient taking differently: Take 20 mg by mouth at bedtime.) 90 tablet 0   [DISCONTINUED] levocetirizine (XYZAL) 5 MG tablet TAKE 1 TABLET(5 MG) BY MOUTH EVERY EVENING 90 tablet 1    Review of Systems     Objective:  There were no vitals filed for this visit. BP Readings from Last 3 Encounters:  09/03/22 (!) 172/74  08/13/22 (!) 170/80  08/08/22 (!) 108/44   Wt Readings from Last 3 Encounters:  09/03/22 118 lb 6.2 oz (53.7 kg)  08/13/22 115 lb (52.2 kg)  03/19/22 122 lb 6.4 oz (55.5 kg)   There is no height or weight on file to calculate BMI.    Physical Exam         Assessment & Plan:    See Problem List for Assessment and Plan of chronic medical problems.

## 2022-09-03 NOTE — Progress Notes (Signed)
RN just notified by tele @ 06:55 about pt HR trending up to 130's. She has maintained around 110's and low 120's throughout the night and asymptomatic. Will notify day shift nurse and day team of trending heart rate.

## 2022-09-03 NOTE — Progress Notes (Signed)
Pt has maintained O2 sats at 95% or above on 28% O2.  She is able to nod and respond when questions asked. She understands and is able to swallow pills whole without complication. Pt has stoma, skin around area is pink, clean, dry, and intact. Pt also has clear device placed between great toe and second toe on left foot. Her son Frankey Poot was notified by night nurse with the pt approval and he stated he had never been asked any admission questions. RN informed him that these were admission questions and th nurse was trying to know the pts baseline and any information needed prior to discharging. Pt had mild headache and received 650 mg PO tylenol and has not complained of any pain since. She has rested throughout the night and has been repositioned without complication or issues.

## 2022-09-03 NOTE — Progress Notes (Signed)
PT is sleeping at this time. RN states she has been doing well since last visit this morning. Vitals on Flowsheet.

## 2022-09-04 ENCOUNTER — Ambulatory Visit: Payer: Medicare Other | Admitting: Internal Medicine

## 2022-09-04 DIAGNOSIS — I5033 Acute on chronic diastolic (congestive) heart failure: Secondary | ICD-10-CM | POA: Diagnosis not present

## 2022-09-04 DIAGNOSIS — I110 Hypertensive heart disease with heart failure: Secondary | ICD-10-CM | POA: Diagnosis not present

## 2022-09-04 LAB — BASIC METABOLIC PANEL
Anion gap: 11 (ref 5–15)
BUN: 59 mg/dL — ABNORMAL HIGH (ref 8–23)
CO2: 19 mmol/L — ABNORMAL LOW (ref 22–32)
Calcium: 8.4 mg/dL — ABNORMAL LOW (ref 8.9–10.3)
Chloride: 109 mmol/L (ref 98–111)
Creatinine, Ser: 4.06 mg/dL — ABNORMAL HIGH (ref 0.44–1.00)
GFR, Estimated: 11 mL/min — ABNORMAL LOW (ref >60)
Glucose, Bld: 101 mg/dL — ABNORMAL HIGH (ref 70–99)
Potassium: 4.3 mmol/L (ref 3.5–5.1)
Sodium: 139 mmol/L (ref 135–145)

## 2022-09-04 LAB — CBC
HCT: 24.4 % — ABNORMAL LOW (ref 36.0–46.0)
Hemoglobin: 7.7 g/dL — ABNORMAL LOW (ref 12.0–15.0)
MCH: 28 pg (ref 26.0–34.0)
MCHC: 31.6 g/dL (ref 30.0–36.0)
MCV: 88.7 fL (ref 80.0–100.0)
Platelets: 196 10*3/uL (ref 150–400)
RBC: 2.75 MIL/uL — ABNORMAL LOW (ref 3.87–5.11)
RDW: 17.9 % — ABNORMAL HIGH (ref 11.5–15.5)
WBC: 7.4 10*3/uL (ref 4.0–10.5)
nRBC: 0 % (ref 0.0–0.2)

## 2022-09-04 LAB — MAGNESIUM: Magnesium: 2.3 mg/dL (ref 1.7–2.4)

## 2022-09-04 MED ORDER — LORATADINE 10 MG PO TABS
10.0000 mg | ORAL_TABLET | Freq: Every day | ORAL | Status: DC
  Administered 2022-09-04 – 2022-09-17 (×13): 10 mg via ORAL
  Filled 2022-09-04 (×14): qty 1

## 2022-09-04 MED ORDER — ORAL CARE MOUTH RINSE
15.0000 mL | OROMUCOSAL | Status: DC
  Administered 2022-09-04 – 2022-09-17 (×40): 15 mL via OROMUCOSAL

## 2022-09-04 MED ORDER — FLUTICASONE PROPIONATE 50 MCG/ACT NA SUSP
2.0000 | Freq: Every day | NASAL | Status: DC
  Administered 2022-09-04 – 2022-09-17 (×8): 2 via NASAL
  Filled 2022-09-04: qty 16

## 2022-09-04 MED ORDER — ORAL CARE MOUTH RINSE: 15.0000 mL | OROMUCOSAL | Status: DC | PRN

## 2022-09-04 NOTE — Progress Notes (Signed)
PT Cancellation Note  Patient Details Name: [name] MRN: [id number] DOB: January 20, 1946   Cancelled Treatment:    Reason Eval/Treat Not Completed: PT screened, no needs identified, will sign off Pt absolutely refused mobilizing today and reports being independent and having no issues at home.  Pt does not wear oxygen at home either.  Pt declined ambulation and checking saturations at this time as well.  Checked with RT on whether conversion tubing to tank was appropriate and RT provided tubing which was left in pt's room in case nursing requires during hospitalization.  Pt refused physical therapy during acute stay.   PT to sign off.   Myrtis Hopping [place] 09/04/2022, 2:47 PM Jannette Spanner PT, DPT Physical Therapist [facility] Preferred contact method: Secure Chat Weekend Pager Only: [phone] Office: [phone]

## 2022-09-04 NOTE — Progress Notes (Signed)
PROGRESS NOTE    [name]  JQB:[id number] DOB: Apr 16, 1946 DOA: 09/02/2022 PCP: Hoyt Koch, MD    Brief Narrative:  [name] is a 76 y.o. female with medical history significant of atrial fibrillation, hypertension, anxiety, depression, hyperlipidemia, PAD, COPD, chronic pain, dysphagia, CKD 4, GERD, hypothyroidism, history of head neck cancer status post laryngectomy, anemia, CVA presenting with ongoing shortness of breath and weakness.     Assessment and Plan: Acute on chronic diastolic CHF Hypertensive emergency Demand ischemia > Patient presenting with ongoing shortness breath and weakness present since recent admission around a month ago.  No obvious cause at that time other than her hypertension. > On this presentation patient noted to have newly elevated BNP to 1263.  Also similarly elevated troponin at 1079 and then improved to 890 on repeat. > Also significantly hypertensive on presentation to 409B systolic did not respond to initial IV hydralazine in the ED and on second dose reduced to the 140s. > Presentation consistent with hypertensive emergency/CHF exacerbation.  BNP points for CHF and this is new.  Demand ischemia is a likely etiology for troponin which is intermittently elevated and is without chest pain.  No indication for heparin at this time per cardiology was consulted in the ED. -cards consult- they defer to nephrology to  manage outpatient -per daughter in law may not have been taking her meds consistently   A-fib - Continue home carvedilol and Eliquis -elquis to be held 3 days prior to procedure ONLY   Hyperlipidemia PAD - Continue home rosuvastatin, cilostazol   CKD 4 > Creatinine stable at 3.56 - Trend renal function and electrolytes - ambulatory referral to nephrology   Anemia-unclear etiology -outpatient follow up- prob CKD > Hemoglobin stable at 8.2   tremor -outpatient referral to neurology  Hypothyroidism - Continue  home Synthroid   Hypomagnesemia -replete  Anxiety Depression - Continue home fluoxetine, Remeron, Ativan -daughter in law states she had been taking more ativan than prescribed   COPD > History of this however do not see any inhalers in chart.   History of head/neck cancer  > Status post laryngectomy with trach in place. - Noted -due to have stoma stretched in October-- I reached out to [facility] and she needs to hold her eliquis 3 days prior to procedure   PT Eval  DVT prophylaxis: apixaban (ELIQUIS) tablet 2.5 mg Start: 09/03/22 1200 apixaban (ELIQUIS) tablet 2.5 mg    Code Status: Full Code Family Communication: daughter in law at bedside  Disposition Plan:  Level of care: Telemetry Home in AM    Consultants:  cards   Subjective: Has developed a tremor over last few months  Objective: Vitals:   09/04/22 0530 09/04/22 0715 09/04/22 0844 09/04/22 1134  BP:  130/66    Pulse:    88  Resp:   19 (!) 21  Temp:      TempSrc:      SpO2:   92% 95%  Weight: 53.5 kg     Height:        Intake/Output Summary (Last 24 hours) at 09/04/2022 1203 Last data filed at 09/04/2022 1113 Gross per 24 hour  Intake 570 ml  Output 1400 ml  Net -830 ml   Filed Weights   09/03/22 0027 09/03/22 0538 09/04/22 0530  Weight: 53.8 kg 53.7 kg 53.5 kg    Examination:   General: Appearance:    elderly female in no acute distress     Lungs:  respirations unlabored  Heart:    Normal heart rate. irregular   MS:   All extremities are intact.    Neurologic:   Awake, alert       Data Reviewed: I have personally reviewed following labs and imaging studies  CBC: Recent Labs  Lab 09/02/22 1827 09/03/22 0154 09/04/22 0459  WBC 9.6 8.4 7.4  NEUTROABS 8.5*  --   --   HGB 8.2* 7.7* 7.7*  HCT 27.0* 24.6* 24.4*  MCV 89.4 90.4 88.7  PLT 170 172 350   Basic Metabolic Panel: Recent Labs  Lab 09/02/22 1827 09/03/22 0154 09/04/22 0459  NA 142 142 139  K 4.9 4.4 4.3  CL 115*  114* 109  CO2 19* 18* 19*  GLUCOSE 100* 109* 101*  BUN 54* 59* 59*  CREATININE 3.56* 3.68* 4.06*  CALCIUM 8.6* 8.2* 8.4*  MG  --  1.6* 2.3   GFR: Estimated Creatinine Clearance: 9.8 mL/min (A) (by C-G formula based on SCr of 4.06 mg/dL (H)). Liver Function Tests: Recent Labs  Lab 09/02/22 1827 09/03/22 0154  AST 16 15  ALT 12 12  ALKPHOS 71 65  BILITOT 0.5 0.4  PROT 5.6* 5.3*  ALBUMIN 2.7* 2.4*   No results for input(s): "LIPASE", "AMYLASE" in the last 168 hours. No results for input(s): "AMMONIA" in the last 168 hours. Coagulation Profile: No results for input(s): "INR", "PROTIME" in the last 168 hours. Cardiac Enzymes: No results for input(s): "CKTOTAL", "CKMB", "CKMBINDEX", "TROPONINI" in the last 168 hours. BNP (last 3 results) No results for input(s): "PROBNP" in the last 8760 hours. HbA1C: No results for input(s): "HGBA1C" in the last 72 hours. CBG: No results for input(s): "GLUCAP" in the last 168 hours. Lipid Profile: No results for input(s): "CHOL", "HDL", "LDLCALC", "TRIG", "CHOLHDL", "LDLDIRECT" in the last 72 hours. Thyroid Function Tests: No results for input(s): "TSH", "T4TOTAL", "FREET4", "T3FREE", "THYROIDAB" in the last 72 hours. Anemia Panel: No results for input(s): "VITAMINB12", "FOLATE", "FERRITIN", "TIBC", "IRON", "RETICCTPCT" in the last 72 hours. Sepsis Labs: No results for input(s): "PROCALCITON", "LATICACIDVEN" in the last 168 hours.  No results found for this or any previous visit (from the past 240 hour(s)).       Radiology Studies: CT Cervical Spine Wo Contrast  Result Date: 09/02/2022 CLINICAL DATA:  Dizziness and weakness. EXAM: CT CERVICAL SPINE WITHOUT CONTRAST TECHNIQUE: Multidetector CT imaging of the cervical spine was performed without intravenous contrast. Multiplanar CT image reconstructions were also generated. RADIATION DOSE REDUCTION: This exam was performed according to the departmental dose-optimization program which  includes automated exposure control, adjustment of the mA and/or kV according to patient size and/or use of iterative reconstruction technique. COMPARISON:  June 14, 2021 FINDINGS: Alignment: There is straightening of the normal cervical spine lordosis. Skull base and vertebrae: No acute fracture. No primary bone lesion or focal pathologic process. Soft tissues and spinal canal: Prominent laryngeal soft tissues are seen at the level of C2-C3 (sagittal reformatted images 48 through 53, CT series 7). This is seen on the prior study and is more prominent on the current exam. Disc levels: Marked severity endplate sclerosis, mild to moderate severity anterior osteophyte formation and posterior bony spurring are seen at the levels of C5-C6 and C6-C7. There is marked severity narrowing of the anterior atlantoaxial articulation. Marked severity intervertebral disc space narrowing is seen at C5-C6, C6-C7 and C7-T1. Bilateral moderate to marked severity multilevel facet joint hypertrophy is noted. Upper chest: Mild to moderate severity biapical scarring and/or atelectasis is  seen. Other: None. IMPRESSION: 1. Marked severity multilevel degenerative changes without evidence of an acute fracture or subluxation. 2. Prominent laryngeal soft tissues which corresponds to the patient's history of laryngeal cancer. Further evaluation with contrast enhanced MRI of the neck is recommended to exclude the presence of local recurrence. 3. Mild to moderate severity biapical scarring and/or atelectasis. Electronically Signed   By: Virgina Norfolk M.D.   On: 09/02/2022 19:56   CT HEAD WO CONTRAST (5MM)  Result Date: 09/02/2022 CLINICAL DATA:  Generalized weakness and dizziness. EXAM: CT HEAD WITHOUT CONTRAST TECHNIQUE: Contiguous axial images were obtained from the base of the skull through the vertex without intravenous contrast. RADIATION DOSE REDUCTION: This exam was performed according to the departmental dose-optimization program  which includes automated exposure control, adjustment of the mA and/or kV according to patient size and/or use of iterative reconstruction technique. COMPARISON:  August 06, 2022 FINDINGS: Brain: There is mild cerebral atrophy with widening of the extra-axial spaces and ventricular dilatation. There are areas of decreased attenuation within the white matter tracts of the supratentorial brain, consistent with microvascular disease changes. Vascular: No hyperdense vessel or unexpected calcification. Skull: Normal. Negative for fracture or focal lesion. Sinuses/Orbits: No acute finding. Other: None. IMPRESSION: No acute intracranial abnormality. Electronically Signed   By: Virgina Norfolk M.D.   On: 09/02/2022 19:44   DG Chest Port 1 View  Result Date: 09/02/2022 CLINICAL DATA:  Weakness. EXAM: PORTABLE CHEST 1 VIEW COMPARISON:  Chest CT dated 08/06/2022. FINDINGS: There is mild cardiomegaly with vascular congestion and edema. Right perihilar rounded density may represent edema or developing pneumonia. Clinical correlation and follow-up to resolution recommended. No pleural effusion or pneumothorax. Atherosclerotic calcification of the aorta. No acute osseous pathology. IMPRESSION: 1. Mild cardiomegaly with vascular congestion and edema. 2. Right perihilar rounded density may represent edema or developing pneumonia. Electronically Signed   By: Anner Crete M.D.   On: 09/02/2022 19:16        Scheduled Meds:  apixaban  2.5 mg Oral BID   carvedilol  25 mg Oral BID WC   cilostazol  100 mg Oral BID   DULoxetine  20 mg Oral Daily   fluticasone  2 spray Each Nare Daily   furosemide  40 mg Oral Daily   hydrALAZINE  100 mg Oral TID   isosorbide mononitrate  60 mg Oral Daily   levothyroxine  50 mcg Oral Q0600   loratadine  10 mg Oral Daily   LORazepam  0.5 mg Oral BID   mirtazapine  15 mg Oral QHS   pantoprazole  40 mg Oral Daily   rosuvastatin  20 mg Oral QHS   sodium chloride flush  3 mL  Intravenous Q12H   Continuous Infusions:   LOS: 1 day    Time spent: 45 minutes spent on chart review, discussion with nursing staff, consultants, updating family and interview/physical exam; more than 50% of that time was spent in counseling and/or coordination of care.    Geradine Girt, DO [facility] Available via Epic secure chat 7am-7pm After these hours, please refer to coverage provider listed on [email or web address] 09/04/2022, 12:03 PM

## 2022-09-04 NOTE — TOC Initial Note (Signed)
Transition of Care [facility]) - Initial/Assessment Note    Patient Details  Name: [name] MRN: [id number] Date of Birth: Jul 03, 1946  [facility]) CM/SW Contact:    Henrietta Dine, RN Phone Number: 09/04/2022, 4:28 PM  Clinical Narrative:  pt from home with son; plan to d/c to same; POC son [name] [phone]; pt has trach and trach collar; her son says supplies are through [facility]; no current PT orders; TOC will con't to follow.              Expected Discharge Plan: Home/Self Care (home with son) Barriers to Discharge: Continued Medical Work up   Patient Goals and CMS Choice Patient states their goals for this hospitalization and ongoing recovery are:: per pt's son; home (pt's son [name] [phone])      Expected Discharge Plan and Services Expected Discharge Plan: Home/Self Care (home with son)   Discharge Planning Services: CM Consult   Living arrangements for the past 2 months: Single Family Home                   DME Agency: Other - Comment (trach supplies from [facility] per pt's son)                  Prior Living Arrangements/Services Living arrangements for the past 2 months: Single Family Home Lives with:: Adult Children Patient language and need for interpreter reviewed:: Yes Do you feel safe going back to the place where you live?: Yes      Need for Family Participation in Patient Care: Yes (Comment) Care giver support system in place?: Yes (comment) Current home services: DME (trach collar; supplies from [facility]) Criminal Activity/Legal Involvement Pertinent to Current Situation/Hospitalization: No - Comment as needed  Activities of Daily Living Home Assistive Devices/Equipment: None ADL Screening (condition at time of admission) Patient's cognitive ability adequate to safely complete daily activities?: Yes Is the patient deaf or have difficulty hearing?: No Does the patient have difficulty seeing,  even when wearing glasses/contacts?: No Does the patient have difficulty concentrating, remembering, or making decisions?: No Patient able to express need for assistance with ADLs?: Yes Does the patient have difficulty dressing or bathing?: No Independently performs ADLs?: Yes (appropriate for developmental age) Does the patient have difficulty walking or climbing stairs?: No Weakness of Legs: None Weakness of Arms/Hands: None  Permission Sought/Granted Permission sought to share information with : Case Manager    Share Information with NAME: [name], RN, CM           Emotional Assessment Appearance:: Appears stated age Attitude/Demeanor/Rapport: Gracious Affect (typically observed): Accepting Orientation: : Oriented to Self Alcohol / Substance Use: Not Applicable Psych Involvement: No (comment)  Admission diagnosis:  Renal insufficiency [N28.9] Elevated troponin [R77.8] CHF exacerbation ([place]) [I50.9] Hypertensive urgency [I16.0] Acute on chronic congestive heart failure, unspecified heart failure type ([place]) [I50.9] Dyspnea [R06.00] Patient Active Problem List   Diagnosis Date Noted   Acute on chronic diastolic CHF (congestive heart failure) ([place]) 09/02/2022   Alaryngeal voice 08/13/2022   History of CVA (cerebrovascular accident) 08/13/2022   Dyspnea 08/07/2022   Community acquired pneumonia 03/19/2022   Facial swelling 01/30/2022   History of DVT (deep vein thrombosis) 01/05/2022   Anemia in CKD (chronic kidney disease) 01/05/2022   Demand ischemia [facility])    Hypertensive emergency    History of head and neck cancer-s/p laryngectomy-with tracheostomy 01/02/2022   PAF (paroxysmal atrial fibrillation)-CHADS2 Vas score of 5 01/02/2022  Cough 01/02/2022   Neck pain 07/11/2021   Aortic atherosclerosis ([place]) 04/09/2021   Moderate protein-calorie malnutrition ([place]) 04/09/2021   Low back pain 01/23/2021   HLD (hyperlipidemia) 01/13/2021   Elevated troponin due to  demand ischemia 12/27/2020   Hypothyroidism 02/25/2020   Senile osteoporosis 02/15/2020   PAD (peripheral artery disease) ([place]) 02/15/2020   Tinea corporis 02/15/2020   Gastroesophageal reflux disease without esophagitis 09/09/2019   Osteopenia 01/19/2019   CKD stage 4 secondary to hypertension ([place]) 01/19/2019   Thiamine deficiency 01/02/2018   Prediabetes 12/28/2017   Hyperlipidemia LDL goal <130 12/28/2017   Dietary folate deficiency anemia 12/28/2017   Moderate episode of recurrent major depressive disorder ([place]) 10/22/2017   S/P laryngectomy 11/03/2016   Stenosis of tracheal stoma 06/10/2016   Diverticulum of pharynx 08/21/2015   Esophageal obstruction 07/17/2015   Esophageal stricture 07/17/2015   Major depression, chronic 02/28/2015   Osteoporosis 02/01/2015   Dysphagia, pharyngoesophageal phase 05/22/2014   History of radiation therapy    Insomnia 02/07/2013   History of uterine cancer 11/24/2012   Chronic pain syndrome 09/03/2012   COPD (chronic obstructive pulmonary disease) ([place]) 08/10/2012   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 08/08/2012    Class: Chronic   Hypertension    GAD (generalized anxiety disorder)    PCP:  Hoyt Koch, MD Pharmacy:   [id number] [place] [id number]: [phone]: [phone] [id number] [place] [id number]: [phone]: [phone]   Social Determinants of Health (SDOH) Interventions    Readmission Risk Interventions    09/04/2022    4:22 PM 01/16/2021    9:58 AM 01/02/2021   11:34 AM  Readmission Risk Prevention Plan  Transportation Screening Complete Complete Complete  PCP or Specialist Appt within 3-5 Days Complete Complete Complete  HRI or Home Care Consult Complete Complete Complete  Social  Work Consult for [place]/Counseling Complete Complete Complete  Palliative Care Screening  Not Applicable Not Applicable  Medication Review (RPress photographerComplete Complete Complete

## 2022-09-04 NOTE — Progress Notes (Signed)
Rounding Note    Patient Name: [name] Date of Encounter: 09/04/2022  [place] Cardiologist: Mertie Moores, MD   Subjective   Non-verbal. Per nurse, daughter in law was here this morning, but left. With stoma unable to use electronic translator service.   Inpatient Medications    Scheduled Meds:  apixaban  2.5 mg Oral BID   carvedilol  25 mg Oral BID WC   cilostazol  100 mg Oral BID   DULoxetine  20 mg Oral Daily   fluticasone  2 spray Each Nare Daily   furosemide  40 mg Oral Daily   hydrALAZINE  100 mg Oral TID   isosorbide mononitrate  60 mg Oral Daily   levothyroxine  50 mcg Oral Q0600   loratadine  10 mg Oral Daily   LORazepam  0.5 mg Oral BID   mirtazapine  15 mg Oral QHS   pantoprazole  40 mg Oral Daily   rosuvastatin  20 mg Oral QHS   sodium chloride flush  3 mL Intravenous Q12H   Continuous Infusions:  PRN Meds: acetaminophen **OR** acetaminophen, guaiFENesin, labetalol, loperamide, melatonin   Vital Signs    Vitals:   09/04/22 0519 09/04/22 0530 09/04/22 0715 09/04/22 0844  BP: (!) 198/73  130/66   Pulse: 80     Resp: 20   19  Temp: 98.5 F (36.9 C)     TempSrc: Oral     SpO2: 98%   92%  Weight:  53.5 kg    Height:        Intake/Output Summary (Last 24 hours) at 09/04/2022 1053 Last data filed at 09/04/2022 0500 Gross per 24 hour  Intake 390 ml  Output 1400 ml  Net -1010 ml      09/04/2022    5:30 AM 09/03/2022    5:38 AM 09/03/2022   12:27 AM  Last 3 Weights  Weight (lbs) 117 lb 15.1 oz 118 lb 6.2 oz 118 lb 9.7 oz  Weight (kg) 53.5 kg 53.7 kg 53.8 kg      Telemetry    NSR with intermittent atrial flutter - Personally Reviewed  ECG    NSR without significant ST-T wave changes - Personally Reviewed  Physical Exam   GEN: No acute distress.  Nonverbal Neck: No JVD. stoma in neck Cardiac: RRR, no murmurs, rubs, or gallops.  Respiratory: Clear to auscultation bilaterally. GI: Soft, nontender, non-distended   MS: No edema; No deformity. Neuro:  unable to assess  Psych: unable to assess  Labs    High Sensitivity Troponin:   Recent Labs  Lab 08/07/22 0506 08/07/22 0626 08/08/22 0318 09/02/22 1827 09/02/22 2030  TROPONINIHS 1,095* 1,217* 870* 1,079* 891*     Chemistry Recent Labs  Lab 09/02/22 1827 09/03/22 0154 09/04/22 0459  NA 142 142 139  K 4.9 4.4 4.3  CL 115* 114* 109  CO2 19* 18* 19*  GLUCOSE 100* 109* 101*  BUN 54* 59* 59*  CREATININE 3.56* 3.68* 4.06*  CALCIUM 8.6* 8.2* 8.4*  MG  --  1.6* 2.3  PROT 5.6* 5.3*  --   ALBUMIN 2.7* 2.4*  --   AST 16 15  --   ALT 12 12  --   ALKPHOS 71 65  --   BILITOT 0.5 0.4  --   GFRNONAA 13* 12* 11*  ANIONGAP '8 10 11    '$ Lipids No results for input(s): "CHOL", "TRIG", "HDL", "LABVLDL", "LDLCALC", "CHOLHDL" in the last 168 hours.  Hematology Recent Labs  Lab 09/02/22 1827 09/03/22 0154 09/04/22 0459  WBC 9.6 8.4 7.4  RBC 3.02* 2.72* 2.75*  HGB 8.2* 7.7* 7.7*  HCT 27.0* 24.6* 24.4*  MCV 89.4 90.4 88.7  MCH 27.2 28.3 28.0  MCHC 30.4 31.3 31.6  RDW 17.7* 17.7* 17.9*  PLT 170 172 196   Thyroid No results for input(s): "TSH", "FREET4" in the last 168 hours.  BNP Recent Labs  Lab 09/02/22 1827  BNP 1,263.5*    DDimer No results for input(s): "DDIMER" in the last 168 hours.   Radiology    CT Cervical Spine Wo Contrast  Result Date: 09/02/2022 CLINICAL DATA:  Dizziness and weakness. EXAM: CT CERVICAL SPINE WITHOUT CONTRAST TECHNIQUE: Multidetector CT imaging of the cervical spine was performed without intravenous contrast. Multiplanar CT image reconstructions were also generated. RADIATION DOSE REDUCTION: This exam was performed according to the departmental dose-optimization program which includes automated exposure control, adjustment of the mA and/or kV according to patient size and/or use of iterative reconstruction technique. COMPARISON:  June 14, 2021 FINDINGS: Alignment: There is straightening of the normal cervical  spine lordosis. Skull base and vertebrae: No acute fracture. No primary bone lesion or focal pathologic process. Soft tissues and spinal canal: Prominent laryngeal soft tissues are seen at the level of C2-C3 (sagittal reformatted images 48 through 53, CT series 7). This is seen on the prior study and is more prominent on the current exam. Disc levels: Marked severity endplate sclerosis, mild to moderate severity anterior osteophyte formation and posterior bony spurring are seen at the levels of C5-C6 and C6-C7. There is marked severity narrowing of the anterior atlantoaxial articulation. Marked severity intervertebral disc space narrowing is seen at C5-C6, C6-C7 and C7-T1. Bilateral moderate to marked severity multilevel facet joint hypertrophy is noted. Upper chest: Mild to moderate severity biapical scarring and/or atelectasis is seen. Other: None. IMPRESSION: 1. Marked severity multilevel degenerative changes without evidence of an acute fracture or subluxation. 2. Prominent laryngeal soft tissues which corresponds to the patient's history of laryngeal cancer. Further evaluation with contrast enhanced MRI of the neck is recommended to exclude the presence of local recurrence. 3. Mild to moderate severity biapical scarring and/or atelectasis. Electronically Signed   By: Virgina Norfolk M.D.   On: 09/02/2022 19:56   CT HEAD WO CONTRAST (5MM)  Result Date: 09/02/2022 CLINICAL DATA:  Generalized weakness and dizziness. EXAM: CT HEAD WITHOUT CONTRAST TECHNIQUE: Contiguous axial images were obtained from the base of the skull through the vertex without intravenous contrast. RADIATION DOSE REDUCTION: This exam was performed according to the departmental dose-optimization program which includes automated exposure control, adjustment of the mA and/or kV according to patient size and/or use of iterative reconstruction technique. COMPARISON:  August 06, 2022 FINDINGS: Brain: There is mild cerebral atrophy with  widening of the extra-axial spaces and ventricular dilatation. There are areas of decreased attenuation within the white matter tracts of the supratentorial brain, consistent with microvascular disease changes. Vascular: No hyperdense vessel or unexpected calcification. Skull: Normal. Negative for fracture or focal lesion. Sinuses/Orbits: No acute finding. Other: None. IMPRESSION: No acute intracranial abnormality. Electronically Signed   By: Virgina Norfolk M.D.   On: 09/02/2022 19:44   DG Chest Port 1 View  Result Date: 09/02/2022 CLINICAL DATA:  Weakness. EXAM: PORTABLE CHEST 1 VIEW COMPARISON:  Chest CT dated 08/06/2022. FINDINGS: There is mild cardiomegaly with vascular congestion and edema. Right perihilar rounded density may represent edema or developing pneumonia. Clinical correlation and follow-up to resolution recommended. No pleural  effusion or pneumothorax. Atherosclerotic calcification of the aorta. No acute osseous pathology. IMPRESSION: 1. Mild cardiomegaly with vascular congestion and edema. 2. Right perihilar rounded density may represent edema or developing pneumonia. Electronically Signed   By: Anner Crete M.D.   On: 09/02/2022 19:16    Cardiac Studies   Echo 08/07/2022  1. Left ventricular ejection fraction, by estimation, is >75%. The left  ventricle has hyperdynamic function. The left ventricle has no regional  wall motion abnormalities. There is moderate concentric left ventricular  hypertrophy. Left ventricular  diastolic parameters are consistent with Grade I diastolic dysfunction  (impaired relaxation).   2. Right ventricular systolic function is normal. The right ventricular  size is normal. There is normal pulmonary artery systolic pressure.   3. Left atrial size was moderately dilated.   4. The mitral valve is normal in structure. No evidence of mitral valve  regurgitation. No evidence of mitral stenosis.   5. The aortic valve is tricuspid. Aortic valve  regurgitation is not  visualized. No aortic stenosis is present.   6. The inferior vena cava is normal in size with <50% respiratory  variability, suggesting right atrial pressure of 8 mmHg.   Patient Profile     76 y.o. female with PMH of head/neck cancer s/p laryngectomy with stoma, afib, HTN, anxiety/depression, HLD, PAD, COPD, CKD stage IV, GERD and hypothyroidism presented with worsening dyspnea and hypertensive emergency  Assessment & Plan    Acute on chronic diastolic heart failure  - underwent IV diuresis, however appears to be euvolemic this morning. Cr bumped up to 4 from 3.5. Lasix transitioned to PO.   Hypertensive urgency  - based on report from family during admission, sounds like patient has poor compliance with her medication at home. Daughter in law has found some of her meds on the floor.   - hydralazine '100mg'$  TID, Imdur '60mg'$  daily, coreg '25mg'$  BID. BP improved to 130s this morning.   Elevated troponin: chronically elevated troponin esp during hypertensive emergency. She is not a candidate for any invasive study due to renal function.   PAF/atrial flutter: h/o atrial fibrillation on low dose Eliquis at home. Overnight, had at least 2 episodes of atrial flutter, currently back in sinus rhythm      For questions or updates, please contact [place] Please consult [email or web address] for contact info under        Signed, Almyra Deforest, [place]  09/04/2022, 10:53 AM

## 2022-09-05 ENCOUNTER — Inpatient Hospital Stay (HOSPITAL_COMMUNITY): Payer: Medicare Other

## 2022-09-05 DIAGNOSIS — I5033 Acute on chronic diastolic (congestive) heart failure: Secondary | ICD-10-CM | POA: Diagnosis not present

## 2022-09-05 LAB — PROCALCITONIN: Procalcitonin: 6.4 ng/mL

## 2022-09-05 MED ORDER — SODIUM CHLORIDE 0.9 % IV SOLN
2.0000 g | INTRAVENOUS | Status: AC
  Administered 2022-09-05 – 2022-09-09 (×5): 2 g via INTRAVENOUS
  Filled 2022-09-05 (×5): qty 20

## 2022-09-05 MED ORDER — AZITHROMYCIN 250 MG PO TABS
500.0000 mg | ORAL_TABLET | Freq: Every day | ORAL | Status: AC
  Administered 2022-09-05 – 2022-09-09 (×4): 500 mg via ORAL
  Filled 2022-09-05 (×5): qty 2

## 2022-09-05 NOTE — Progress Notes (Signed)
MD order to wean patient off of O 2, patient does not use O 2 at home. Attempted to start process and went from 5 liters to 4 liters. Patient was doing ok throughout the day until RT came to suction patient, after suctioning patient's O 2 dropped to upper 80's and RT increased O 2 back to 5 liters. Will discuss with oncoming nurse to attempt again.

## 2022-09-05 NOTE — Progress Notes (Signed)
Patient was suctioned by RT, vitals taken after suctioning '@0737'$  and vitals were abnormal. Patient is currently more relaxed and vitals are back to normal status. MEWs protocol not needed due to status of patient during time vitals were taken.

## 2022-09-05 NOTE — Progress Notes (Signed)
PROGRESS NOTE    TISHAWNA Shaw  QJJ:[id number] DOB: August 28, 1946 DOA: 09/02/2022 PCP: Hoyt Koch, MD    Brief Narrative:  [name] is a 76 y.o. female with medical history significant of atrial fibrillation, hypertension, anxiety, depression, hyperlipidemia, PAD, COPD, chronic pain, dysphagia, CKD 4, GERD, hypothyroidism, history of head neck cancer status post laryngectomy, anemia, CVA presenting with ongoing shortness of breath and weakness.  Does not wear O2 at home.      Assessment and Plan: Acute on chronic diastolic CHF Hypertensive emergency Demand ischemia > Patient presenting with ongoing shortness breath and weakness present since recent admission around a month ago.  No obvious cause at that time other than her hypertension. > On this presentation patient noted to have newly elevated BNP to 1263.  Also similarly elevated troponin at 1079 and then improved to 890 on repeat. > Also significantly hypertensive on presentation to 481E systolic did not respond to initial IV hydralazine in the ED and on second dose reduced to the 140s. > Presentation consistent with hypertensive emergency/CHF exacerbation.  BNP points for CHF and this is new.  Demand ischemia is a likely etiology for troponin which is intermittently elevated and is without chest pain.  No indication for heparin at this time per cardiology was consulted in the ED. -cards consult- they defer to nephrology to  manage outpatient -per daughter in law may not have been taking her meds consistently   Acute resp failure with hypoxia -does not wear O2 at home -wean to RA -pulm toilet -x ray with some improvement -check pro calcitonin  A-fib - Continue home carvedilol and Eliquis -elquis to be held 3 days prior to procedure ONLY   Hyperlipidemia PAD - Continue home rosuvastatin, cilostazol   CKD 4 > Creatinine stable - Trend renal function and electrolytes - ambulatory referral to nephrology  (has seen Dr. Wallace Keller in the past)   Anemia-unclear etiology -outpatient follow up- prob CKD > Hemoglobin stable at 8.2   tremor -outpatient referral to neurology -will also need testing for dementia  Hypothyroidism - Continue home Synthroid   Hypomagnesemia -replete  Anxiety Depression - Continue home fluoxetine, Remeron, Ativan -daughter in law states she had been taking more ativan than prescribed   COPD > History of this however do not see any inhalers in chart.   History of head/neck cancer  > Status post laryngectomy with trach in place. - Noted -due to have stoma stretched in October-- I reached out to [facility] and she needs to hold her eliquis 3 days prior to procedure   PT Eval- patient refused  DVT prophylaxis: apixaban (ELIQUIS) tablet 2.5 mg Start: 09/03/22 1200 apixaban (ELIQUIS) tablet 2.5 mg    Code Status: Full Code Family Communication: daughter in law - called  Disposition Plan:  Level of care: Telemetry Home in AM    Consultants:  cards   Subjective: Has developed a tremor over last few months  Objective: Vitals:   09/05/22 0737 09/05/22 0758 09/05/22 1114 09/05/22 1227  BP:  138/66    Pulse: (!) 110 (!) 108 73   Resp: (!) '23 18 20   '$ Temp:  99.1 F (37.3 C)    TempSrc:  Oral    SpO2: 93% 94% 96% 94%  Weight:      Height:        Intake/Output Summary (Last 24 hours) at 09/05/2022 1242 Last data filed at 09/05/2022 0810 Gross per 24 hour  Intake 0 ml  Output 1001  ml  Net -1001 ml   Filed Weights   09/03/22 0538 09/04/22 0530 09/05/22 0604  Weight: 53.7 kg 53.5 kg 52.3 kg    Examination:   General: Appearance:    elderly female in no acute distress     Lungs:     respirations unlabored  Heart:    Normal heart rate. irregular   MS:   All extremities are intact.    Neurologic:   Awake, alert       Data Reviewed: I have personally reviewed following labs and imaging studies  CBC: Recent Labs  Lab 09/02/22 1827  09/03/22 0154 09/04/22 0459  WBC 9.6 8.4 7.4  NEUTROABS 8.5*  --   --   HGB 8.2* 7.7* 7.7*  HCT 27.0* 24.6* 24.4*  MCV 89.4 90.4 88.7  PLT 170 172 161   Basic Metabolic Panel: Recent Labs  Lab 09/02/22 1827 09/03/22 0154 09/04/22 0459  NA 142 142 139  K 4.9 4.4 4.3  CL 115* 114* 109  CO2 19* 18* 19*  GLUCOSE 100* 109* 101*  BUN 54* 59* 59*  CREATININE 3.56* 3.68* 4.06*  CALCIUM 8.6* 8.2* 8.4*  MG  --  1.6* 2.3   GFR: Estimated Creatinine Clearance: 9.7 mL/min (A) (by C-G formula based on SCr of 4.06 mg/dL (H)). Liver Function Tests: Recent Labs  Lab 09/02/22 1827 09/03/22 0154  AST 16 15  ALT 12 12  ALKPHOS 71 65  BILITOT 0.5 0.4  PROT 5.6* 5.3*  ALBUMIN 2.7* 2.4*   No results for input(s): "LIPASE", "AMYLASE" in the last 168 hours. No results for input(s): "AMMONIA" in the last 168 hours. Coagulation Profile: No results for input(s): "INR", "PROTIME" in the last 168 hours. Cardiac Enzymes: No results for input(s): "CKTOTAL", "CKMB", "CKMBINDEX", "TROPONINI" in the last 168 hours. BNP (last 3 results) No results for input(s): "PROBNP" in the last 8760 hours. HbA1C: No results for input(s): "HGBA1C" in the last 72 hours. CBG: No results for input(s): "GLUCAP" in the last 168 hours. Lipid Profile: No results for input(s): "CHOL", "HDL", "LDLCALC", "TRIG", "CHOLHDL", "LDLDIRECT" in the last 72 hours. Thyroid Function Tests: No results for input(s): "TSH", "T4TOTAL", "FREET4", "T3FREE", "THYROIDAB" in the last 72 hours. Anemia Panel: No results for input(s): "VITAMINB12", "FOLATE", "FERRITIN", "TIBC", "IRON", "RETICCTPCT" in the last 72 hours. Sepsis Labs: No results for input(s): "PROCALCITON", "LATICACIDVEN" in the last 168 hours.  No results found for this or any previous visit (from the past 240 hour(s)).       Radiology Studies: DG CHEST PORT 1 VIEW  Result Date: 09/05/2022 CLINICAL DATA:  10031, cough EXAM: PORTABLE CHEST 1 VIEW COMPARISON:   September 02, 2022 FINDINGS: The cardiomediastinal silhouette is unchanged in contour.Atherosclerotic calcifications of the aorta. No pleural effusion. No pneumothorax. Improved aeration of the RIGHT lung in comparison to prior with mild RIGHT greater than LEFT reticular nodularity. Biapical pleuroparenchymal thickening. Visualized abdomen is unremarkable. IMPRESSION: Improved aeration of the RIGHT lung in comparison to prior with mild residual reticular nodularity. Differential considerations include infection, aspiration or atelectasis. Electronically Signed   By: Valentino Saxon M.D.   On: 09/05/2022 11:57        Scheduled Meds:  apixaban  2.5 mg Oral BID   carvedilol  25 mg Oral BID WC   cilostazol  100 mg Oral BID   DULoxetine  20 mg Oral Daily   fluticasone  2 spray Each Nare Daily   furosemide  40 mg Oral Daily   hydrALAZINE  100  mg Oral TID   isosorbide mononitrate  60 mg Oral Daily   levothyroxine  50 mcg Oral Q0600   loratadine  10 mg Oral Daily   LORazepam  0.5 mg Oral BID   mirtazapine  15 mg Oral QHS   mouth rinse  15 mL Mouth Rinse 4 times per day   pantoprazole  40 mg Oral Daily   rosuvastatin  20 mg Oral QHS   sodium chloride flush  3 mL Intravenous Q12H   Continuous Infusions:   LOS: 2 days    Time spent: 45 minutes spent on chart review, discussion with nursing staff, consultants, updating family and interview/physical exam; more than 50% of that time was spent in counseling and/or coordination of care.    Geradine Girt, DO [facility] Available via Epic secure chat 7am-7pm After these hours, please refer to coverage provider listed on [email or web address] 09/05/2022, 12:42 PM

## 2022-09-06 ENCOUNTER — Encounter (HOSPITAL_COMMUNITY): Payer: Self-pay | Admitting: Internal Medicine

## 2022-09-06 ENCOUNTER — Inpatient Hospital Stay (HOSPITAL_COMMUNITY): Payer: Medicare Other

## 2022-09-06 DIAGNOSIS — I5033 Acute on chronic diastolic (congestive) heart failure: Secondary | ICD-10-CM | POA: Diagnosis not present

## 2022-09-06 LAB — RENAL FUNCTION PANEL
Albumin: 2.5 g/dL — ABNORMAL LOW (ref 3.5–5.0)
Anion gap: 13 (ref 5–15)
BUN: 65 mg/dL — ABNORMAL HIGH (ref 8–23)
CO2: 18 mmol/L — ABNORMAL LOW (ref 22–32)
Calcium: 8.5 mg/dL — ABNORMAL LOW (ref 8.9–10.3)
Chloride: 108 mmol/L (ref 98–111)
Creatinine, Ser: 4.96 mg/dL — ABNORMAL HIGH (ref 0.44–1.00)
GFR, Estimated: 9 mL/min — ABNORMAL LOW (ref >60)
Glucose, Bld: 103 mg/dL — ABNORMAL HIGH (ref 70–99)
Phosphorus: 7.6 mg/dL — ABNORMAL HIGH (ref 2.5–4.6)
Potassium: 4.4 mmol/L (ref 3.5–5.1)
Sodium: 139 mmol/L (ref 135–145)

## 2022-09-06 LAB — CREATININE, SERUM
Creatinine, Ser: 4.88 mg/dL — ABNORMAL HIGH (ref 0.44–1.00)
GFR, Estimated: 9 mL/min — ABNORMAL LOW (ref >60)

## 2022-09-06 LAB — CBC
HCT: 25 % — ABNORMAL LOW (ref 36.0–46.0)
Hemoglobin: 7.7 g/dL — ABNORMAL LOW (ref 12.0–15.0)
MCH: 27.4 pg (ref 26.0–34.0)
MCHC: 30.8 g/dL (ref 30.0–36.0)
MCV: 89 fL (ref 80.0–100.0)
Platelets: 242 10*3/uL (ref 150–400)
RBC: 2.81 MIL/uL — ABNORMAL LOW (ref 3.87–5.11)
RDW: 17.1 % — ABNORMAL HIGH (ref 11.5–15.5)
WBC: 9.2 10*3/uL (ref 4.0–10.5)
nRBC: 0 % (ref 0.0–0.2)

## 2022-09-06 LAB — PROCALCITONIN: Procalcitonin: 5.31 ng/mL

## 2022-09-06 MED ORDER — PHENOL 1.4 % MT LIQD
1.0000 | OROMUCOSAL | Status: DC | PRN
  Filled 2022-09-06: qty 177

## 2022-09-06 MED ORDER — SODIUM CHLORIDE 0.9 % IV BOLUS
500.0000 mL | Freq: Once | INTRAVENOUS | Status: AC
  Administered 2022-09-06: 500 mL via INTRAVENOUS

## 2022-09-06 MED ORDER — ENSURE ENLIVE PO LIQD
237.0000 mL | Freq: Two times a day (BID) | ORAL | Status: DC
  Administered 2022-09-07 – 2022-09-17 (×9): 237 mL via ORAL

## 2022-09-06 MED ORDER — ENSURE ENLIVE PO LIQD: 237.0000 mL | Freq: Two times a day (BID) | ORAL | Status: DC

## 2022-09-06 MED ORDER — DULOXETINE HCL 20 MG PO CPEP
20.0000 mg | ORAL_CAPSULE | Freq: Every day | ORAL | Status: DC
  Administered 2022-09-07 – 2022-09-17 (×10): 20 mg via ORAL
  Filled 2022-09-06 (×11): qty 1

## 2022-09-06 MED ORDER — SODIUM BICARBONATE 8.4 % IV SOLN
INTRAVENOUS | Status: DC
  Filled 2022-09-06: qty 150
  Filled 2022-09-06: qty 1000
  Filled 2022-09-06: qty 150

## 2022-09-06 NOTE — Consult Note (Addendum)
Renal Service Consult Note [facility]  [name] 09/06/2022 Ann Blazing, MD Requesting Physician: Dr. Eliseo Shaw  Reason for Consult: Renal failure HPI: The patient is a 76 y.o. year-old w/ hx of anemia, anxiety, chronic back pain, CKD 4, COPD, depression, HL, HTN, PAD, hx CVA, hx uterine cancer, atrial fib, hx head & neck cancer sp laryngectomy who presented 9/20 w/ gen'd weakness and SOB. Not on home O2. Pt reported SOB and weakness since being dc'd after prior hospital stay about 1 month ago. Also reported LE edema. IN ED HR 80s, BP 230s then down to 140s after IV hydralazine. RR 10s- 20s, was on 0-5 L per trach per chart. Creat 3.5, stable. BNP 1263. CXR showed vasc congestion and edema w/ perihilar R sided density c/w edema vs pna. CTH negative. Pt was given IV lasix, hydralazine, reglan, benadryl in the ED. Cardiology consulted and recommended treatment of HTN and CHF, suspicion for demand ischemia causing to troponin elevation. Renal function has worsened up to 4.8 since admission. Admission. We are asked to see the patient for renal failure.   Pt has seen Dr [name] at [facility] in the past.  No sig c/o's at this time. Has a harsh cough off and on. No sig SOB issues. No swelling or edema.   ROS - denies CP, no joint pain, no HA, no blurry vision, no rash, no diarrhea, no nausea/ vomiting, no dysuria, no difficulty voiding   Past Medical History  Past Medical History:  Diagnosis Date   Anemia    Anxiety    takes Ativan prn   Blood transfusion without reported diagnosis 09/15/12   2 units Prbc's   Broken ribs    Chronic back pain    CKD (chronic kidney disease), stage IV (HCC)    Constipation    related to pain meds   COPD (chronic obstructive pulmonary disease) ([place]) 08/10/2012   denies   Depression    Gastrostomy in place [facility])    removed   GERD (gastroesophageal reflux disease)    takes Zantac daily   Headache(784.0)    Hiatal hernia 08/10/2012   History of  radiation therapy 10/17/12-11/25/12   supraglottic larynx,high risk neck tumor bed 5880 cGy/28 sessions, high risk lymph node tumor bed 5600 cGy/20 sessions, mod risk lymph node tumor bed 5040 cGy/20 sessions   Hypercholesteremia    takes Pravastatin daily   Hypertension    takes Tribenzor and Atenolol daily   Insomnia    takes Amitriptyline daily   Nausea    takes Zofran prn   PAD (peripheral artery disease) ([place])    noninvasive imaging in 2016   Pneumonia    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 08/08/2012   Shortness of breath dyspnea    Stroke [facility]) 2011   denies. no residual   Uterine cancer (HCC)    Past Surgical History  Past Surgical History:  Procedure Laterality Date   ABDOMINAL SURGERY     r/t uterine carcinoma   APPENDECTOMY     DIRECT LARYNGOSCOPY N/A 05/22/2014   Procedure: DIRECT LARYNGOSCOPY WITH ESOPHAGEAL DILATION;  Surgeon: Jerrell Belfast, MD;  Location: [facility] OR;  Service: ENT;  Laterality: N/A;   ESOPHAGOSCOPY WITH DILITATION N/A 05/29/2015   Procedure: ESOPHAGOSCOPY WITH ESOPHAGEAL DILITATION;  Surgeon: Jerrell Belfast, MD;  Location: [facility] OR;  Service: ENT;  Laterality: N/A;   Gastrostomy Tube removed   2013   GASTROSTOMY W/ FEEDING TUBE  12   HERNIA REPAIR  child   LARYNGETOMY  08/31/2012   Procedure: LARYNGECTOMY;  Surgeon: Jerrell Belfast, MD;  Location: [place];  Service: ENT;  Laterality: N/A;   LARYNGOSCOPY  08/10/2012   Procedure: LARYNGOSCOPY;  Surgeon: Jerrell Belfast, MD;  Location: [facility] ORS;  Service: ENT;  Laterality: N/A;  with biopsy   RADICAL NECK DISSECTION  08/31/2012   Procedure: RADICAL NECK DISSECTION;  Surgeon: Jerrell Belfast, MD;  Location: [place];  Service: ENT;  Laterality: Bilateral;   TRACHEAL ESOPHOGEAL PUNCTURE WITH REPAIR STOMA N/A 09/08/2013   Procedure: TRACHEAL ESOPHOGEAL PUNCTURE WITH PLACEMENT OF  PROVOX PROSTHESIS ;  Surgeon: Jerrell Belfast, MD;  Location: [place];  Service: ENT;  Laterality: N/A;   TRACHEOSTOMY TUBE  PLACEMENT  08/10/2012   Procedure: TRACHEOSTOMY;  Surgeon: Jerrell Belfast, MD;  Location: [facility];  Service: ENT;  Laterality: N/A;   Family History  Family History  Problem Relation Age of Onset   Throat cancer Father    Brain cancer Brother    CAD Neg Hx    Social History  reports that she quit smoking about 9 years ago. Her smoking use included cigarettes. She has a 12.50 pack-year smoking history. She has never used smokeless tobacco. She reports current alcohol use. She reports that she does not use drugs. Allergies  Allergies  Allergen Reactions   Xyzal [Levocetirizine Dihydrochloride] Itching   Augmentin [Amoxicillin-Pot Clavulanate] Diarrhea and Nausea And Vomiting   Tribenzor [Olmesartan-Amlodipine-Hctz] Other (See Comments)    "Hurt the kidneys"   Home medications Prior to Admission medications   Medication Sig Start Date End Date Taking? Authorizing Provider  acetaminophen (TYLENOL) 500 MG tablet Take 1,000 mg by mouth as needed for headache.   Yes [provider]  apixaban (ELIQUIS) 2.5 MG TABS tablet Take 1 tablet (2.5 mg total) by mouth 2 (two) times daily. 02/10/22  Yes Hoyt Koch, MD  carvedilol (COREG) 25 MG tablet TAKE 1 TABLET (25 MG TOTAL) BY MOUTH TWICE A DAY WITH MEALS Patient taking differently: Take 25 mg by mouth 2 (two) times daily with a meal. 05/26/22  Yes Hoyt Koch, MD  cilostazol (PLETAL) 100 MG tablet TAKE 1 TABLET BY MOUTH TWICE A DAY Patient taking differently: Take 100 mg by mouth 2 (two) times daily. 02/03/22  Yes Hoyt Koch, MD  docusate sodium (COLACE) 100 MG capsule Take 1 capsule (100 mg total) by mouth daily. 01/09/22  Yes Little Ishikawa, MD  DULoxetine (CYMBALTA) 20 MG capsule TAKE 1 CAPSULE BY MOUTH EVERY DAY Patient taking differently: Take 20 mg by mouth daily. 07/23/22  Yes Hoyt Koch, MD  fluticasone (FLONASE) 50 MCG/ACT nasal spray SPRAY 2 SPRAYS INTO EACH NOSTRIL EVERY DAY Patient  taking differently: Place 2 sprays into both nostrils daily. 01/31/22  Yes Janith Lima, MD  furosemide (LASIX) 20 MG tablet Take 1 tablet (20 mg total) by mouth daily as needed. 05/20/22  Yes Hoyt Koch, MD  hydrALAZINE (APRESOLINE) 100 MG tablet Take 1 tablet (100 mg total) by mouth 3 (three) times daily. 03/03/22  Yes Hoyt Koch, MD  isosorbide mononitrate (IMDUR) 60 MG 24 hr tablet TAKE 1 TABLET BY MOUTH EVERY DAY Patient taking differently: Take 60 mg by mouth daily. 07/29/22  Yes Hoyt Koch, MD  levothyroxine (SYNTHROID) 50 MCG tablet TAKE 1 TABLET BY MOUTH EVERY DAY BEFORE BREAKFAST Patient taking differently: Take 50 mcg by mouth daily before breakfast. 04/30/22  Yes Hoyt Koch, MD  loperamide (IMODIUM A-D) 2  MG tablet Take 2 mg by mouth as needed for diarrhea or loose stools.   Yes [provider]  LORazepam (ATIVAN) 0.5 MG tablet Take 0.5 mg by mouth 2 (two) times daily. 08/22/22  Yes [provider]  LORazepam (ATIVAN) 1 MG tablet Take 1 tablet (1 mg total) by mouth 2 (two) times daily as needed for anxiety. 08/13/22  Yes Hoyt Koch, MD  mirtazapine (REMERON) 15 MG tablet Take 15 mg by mouth at bedtime.   Yes [provider]  ondansetron (ZOFRAN) 4 MG tablet TAKE 1 TABLET BY MOUTH EVERY 8 HOURS AS NEEDED FOR NAUSEA AND VOMITING Patient taking differently: Take 4 mg by mouth as needed for nausea or vomiting. 02/03/22  Yes Hoyt Koch, MD  rosuvastatin (CRESTOR) 20 MG tablet TAKE 1 TABLET (20 MG TOTAL) BY MOUTH DAILY. Patient taking differently: Take 20 mg by mouth at bedtime. 07/29/22  Yes Hoyt Koch, MD  amLODipine (NORVASC) 10 MG tablet Take 1 tablet (10 mg total) by mouth daily. Patient not taking: Reported on 09/02/2022 01/09/22   Little Ishikawa, MD  Ensure (ENSURE) Take 237 mLs by mouth daily.    [provider]  predniSONE (DELTASONE) 20 MG tablet Days 1-7 take 2 pills daily.  Days 8-14 take 1 pill daily Patient not taking: Reported on 09/02/2022 08/13/22   Hoyt Koch, MD  levocetirizine (XYZAL) 5 MG tablet TAKE 1 TABLET(5 MG) BY MOUTH EVERY EVENING 02/21/20 06/10/20  Janith Lima, MD     Vitals:   09/06/22 1145 09/06/22 1451 09/06/22 1623 09/06/22 1932  BP:  112/60    Pulse: (!) 107 97  (!) 101  Resp:  (!) 22    Temp:  98.6 F (37 C)    TempSrc:  Axillary    SpO2: 93% 100% 96% 96%  Weight:      Height:       Exam Gen alert, no distress Tracheostomy is open, using O2 mask, is not cannulated Sclera anicteric, throat clear  No jvd or bruits, flat neck veins Chest occ scattered rhonchi and short wheezes bilat RRR no RG Abd soft ntnd no mass or ascites +bs GU defer MS no joint effusions or deformity Ext no LE or UE edema, no wounds or ulcers Neuro is alert, Ox 3 , nf   Home meds include - amlodipine 10, ensure, apixaban, carvedilol 25 bid, cilostazol, colace, duloxetine, furosemide 20 prn, hydralazine 100 tid, isosorbide mononitrate 60 qd, mirtazapine, prednisone taper, rosuvastatin, synthroid, lorazepam prn, prns/ vits/ supps     Date   Creat   egFR    2010   0.8    2011   1.7    2013   0.8- 1.3    2014   1.18- 1.56     2015   1.15- 1.37     2016   1.31- 1.52    2019   1.24    2020   1.60- 1.63    2021   1.71- 1.98    Jan 2022  1.87- 2.70    Feb 2022  3.79 >> 2.2 AKI episode    Mar 2022  1.6    April  - aug 2022 2.29- 2.51 18- 22 ml/min, CKD 17 Dec 2021  2.67- 3.45    Feb 2023  2.68    March 2023  3.10 Aug 2022  3.20- 3.63 12-14 ml/min, CKD 5    9/20 2023  3.56  9/21   3.68    9/22   4.06    09/06/22  4.88     ECHO Aug 2023 - LVEF > 75%, hyperdynamic function of the LV. Moderate concentric LVH. G1DD. RV fxn is wnl. No MS or AS  BP's high on admit 231/ 80, then improved quickly to 140- 180/ 58- 78 and have remained stable since 9/21  I/O = 1.4 L in and 3.7 L out = net negative 2.3 L   CXR 9/20 - IMPRESSION: 1. Mild  cardiomegaly with vascular congestion and edema. Right perihilar rounded density may represent edema or developing pneumonia. CXR 9/23 - perihilar infiltrate /edema on R has resolved. Vasc congestion better.  Na 139  K 4.4 CO2 18  BUN 65  Cr 4.88  Phos 7.6  Alb 2.5   UA , urine lytes not ordered   Assessment/ Plan: AKI on CKD 5 - b/l creat is 3.2- 3.6 from august 2023, eGFR 12-14 ml/min. Creat here was 3.5 on admission, which is in normal range for her. With diuresis for suspected CHF the CXR changes have improved, but also the creat has increased up to 4.0 yest and 4.8 today. On exam, coarse lungs but probably mostly upper airway noise. Has flat neck veins and no LE / UE edema. Renal US, UA are pending. Two other possible causes of CXR changes other than vol overload on admission are (1) aspiration pneumonitis, which can resolve itself in 2-3 days w/ or w/o abx, and (2) acute ischemia causing flash pulm edema which is not due to vol overload but due to cardiac ischemia; in either scenario diuresing could cause hypovolemia and rising creatinine. Recommend trial of IVF's and have dc'd po lasix for now.  Also get renal US, UA and urine lytes. Will follow.  HTNsive urgency - BP's a bit labile, but not uncontrolled now Atrial fib COPD Hx head/ neck cancer / sp laryngectomy w/ trach in place      Rob Parminder Trapani  MD 09/06/2022, 8:19 PM Recent Labs  Lab 09/03/22 0154 09/04/22 0459 09/06/22 0508  HGB 7.7* 7.7* 7.7*  ALBUMIN 2.4*  --  2.5*  CALCIUM 8.2* 8.4* 8.5*  PHOS  --   --  7.6*  CREATININE 3.68* 4.06* 4.96*  4.88*  K 4.4 4.3 4.4   Inpatient medications:  apixaban  2.5 mg Oral BID   azithromycin  500 mg Oral Daily   carvedilol  25 mg Oral BID WC   cilostazol  100 mg Oral BID   [START ON 09/07/2022] DULoxetine  20 mg Oral Daily   feeding supplement  237 mL Oral BID BM   fluticasone  2 spray Each Nare Daily   furosemide  40 mg Oral Daily   hydrALAZINE  100 mg Oral TID   isosorbide  mononitrate  60 mg Oral Daily   levothyroxine  50 mcg Oral Q0600   loratadine  10 mg Oral Daily   LORazepam  0.5 mg Oral BID   mirtazapine  15 mg Oral QHS   mouth rinse  15 mL Mouth Rinse 4 times per day   pantoprazole  40 mg Oral Daily   rosuvastatin  20 mg Oral QHS   sodium chloride flush  3 mL Intravenous Q12H    cefTRIAXone (ROCEPHIN)  IV 2 g (09/06/22 1548)   acetaminophen **OR** acetaminophen, guaiFENesin, labetalol, loperamide, melatonin, mouth rinse, phenol

## 2022-09-06 NOTE — Progress Notes (Signed)
PROGRESS NOTE    [name]  UYQ:[id number] DOB: 1946-03-24 DOA: 09/02/2022 PCP: Hoyt Koch, MD    Brief Narrative:  [name] is a 76 y.o. female with medical history significant of atrial fibrillation, hypertension, anxiety, depression, hyperlipidemia, PAD, COPD, chronic pain, dysphagia, CKD 4, GERD, hypothyroidism, history of head neck cancer status post laryngectomy, anemia, CVA presenting with ongoing shortness of breath and weakness.  Does not wear O2 at home.      Assessment and Plan: Acute on chronic diastolic CHF Hypertensive emergency Demand ischemia > Patient presenting with ongoing shortness breath and weakness present since recent admission around a month ago.  No obvious cause at that time other than her hypertension. > On this presentation patient noted to have newly elevated BNP to 1263.  Also similarly elevated troponin at 1079 and then improved to 890 on repeat. > Also significantly hypertensive on presentation to 638V systolic did not respond to initial IV hydralazine in the ED and on second dose reduced to the 140s. > Presentation consistent with hypertensive emergency/CHF exacerbation.  BNP points for CHF and this is new.  Demand ischemia is a likely etiology for troponin which is intermittently elevated and is without chest pain.  No indication for heparin at this time per cardiology was consulted in the ED. -cards consult- they defer to nephrology to  manage outpatient -per daughter in law may not have been taking her meds consistently   Acute resp failure with hypoxia -does not wear O2 at home -wean to RA -pulm toilet -x ray with some improvement -? PNA-- pro calcitonin elevated -IV abx and trend  A-fib - Continue home carvedilol and Eliquis -elquis to be held 3 days prior to procedure ONLY   Hyperlipidemia PAD - Continue home rosuvastatin, cilostazol   Worsening CKD 4 > Creatinine stable - Trend renal function and  electrolytes - will ask nephrology to see (has seen Dr. Wallace Keller in the past)   Anemia-unclear etiology -outpatient follow up- prob CKD > Hemoglobin stable at 8.2   tremor -outpatient referral to neurology -will also need testing for dementia  Hypothyroidism - Continue home Synthroid   Hypomagnesemia -replete  Anxiety Depression - Continue home fluoxetine, Remeron, Ativan -daughter in law states she had been taking more ativan than prescribed   COPD > History of this however do not see any inhalers in chart.   History of head/neck cancer  > Status post laryngectomy with trach in place. - Noted -due to have stoma stretched in October-- I reached out to [facility] and she needs to hold her eliquis 3 days prior to procedure   PT Eval- patient refused- says she will participate if not done after suctioning  DVT prophylaxis: apixaban (ELIQUIS) tablet 2.5 mg Start: 09/03/22 1200 apixaban (ELIQUIS) tablet 2.5 mg    Code Status: Full Code Family Communication: daughter in law - called  Disposition Plan:  Level of care: Telemetry Home in AM    Consultants:  cards   Subjective: Not able to be weaned off O2  Objective: Vitals:   09/06/22 0847 09/06/22 0858 09/06/22 0900 09/06/22 1145  BP:  (!) 177/58 (!) 177/58   Pulse: 95  92 (!) 107  Resp:  (!) 22 20   Temp:  98.8 F (37.1 C) 98.8 F (37.1 C)   TempSrc:  Oral Oral   SpO2: 93%   93%  Weight:      Height:        Intake/Output Summary (Last 24 hours)  at 09/06/2022 1218 Last data filed at 09/06/2022 0750 Gross per 24 hour  Intake 440 ml  Output 700 ml  Net -260 ml   Filed Weights   09/04/22 0530 09/05/22 0604 09/06/22 0500  Weight: 53.5 kg 52.3 kg 50.4 kg    Examination:   General: Appearance:    Thin female in no acute distress     Lungs:     On [place] via trach, respirations unlabored, no wheezing, diminished  Heart:    Tachycardic.   MS:   All extremities are intact.   Neurologic:   Awake, alert          Data Reviewed: I have personally reviewed following labs and imaging studies  CBC: Recent Labs  Lab 09/02/22 1827 09/03/22 0154 09/04/22 0459 09/06/22 0508  WBC 9.6 8.4 7.4 9.2  NEUTROABS 8.5*  --   --   --   HGB 8.2* 7.7* 7.7* 7.7*  HCT 27.0* 24.6* 24.4* 25.0*  MCV 89.4 90.4 88.7 89.0  PLT 170 172 196 979   Basic Metabolic Panel: Recent Labs  Lab 09/02/22 1827 09/03/22 0154 09/04/22 0459 09/06/22 0508  NA 142 142 139 139  K 4.9 4.4 4.3 4.4  CL 115* 114* 109 108  CO2 19* 18* 19* 18*  GLUCOSE 100* 109* 101* 103*  BUN 54* 59* 59* 65*  CREATININE 3.56* 3.68* 4.06* 4.96*  4.88*  CALCIUM 8.6* 8.2* 8.4* 8.5*  MG  --  1.6* 2.3  --   PHOS  --   --   --  7.6*   GFR: Estimated Creatinine Clearance: 7.8 mL/min (A) (by C-G formula based on SCr of 4.88 mg/dL (H)). Liver Function Tests: Recent Labs  Lab 09/02/22 1827 09/03/22 0154 09/06/22 0508  AST 16 15  --   ALT 12 12  --   ALKPHOS 71 65  --   BILITOT 0.5 0.4  --   PROT 5.6* 5.3*  --   ALBUMIN 2.7* 2.4* 2.5*   No results for input(s): "LIPASE", "AMYLASE" in the last 168 hours. No results for input(s): "AMMONIA" in the last 168 hours. Coagulation Profile: No results for input(s): "INR", "PROTIME" in the last 168 hours. Cardiac Enzymes: No results for input(s): "CKTOTAL", "CKMB", "CKMBINDEX", "TROPONINI" in the last 168 hours. BNP (last 3 results) No results for input(s): "PROBNP" in the last 8760 hours. HbA1C: No results for input(s): "HGBA1C" in the last 72 hours. CBG: No results for input(s): "GLUCAP" in the last 168 hours. Lipid Profile: No results for input(s): "CHOL", "HDL", "LDLCALC", "TRIG", "CHOLHDL", "LDLDIRECT" in the last 72 hours. Thyroid Function Tests: No results for input(s): "TSH", "T4TOTAL", "FREET4", "T3FREE", "THYROIDAB" in the last 72 hours. Anemia Panel: No results for input(s): "VITAMINB12", "FOLATE", "FERRITIN", "TIBC", "IRON", "RETICCTPCT" in the last 72 hours. Sepsis  Labs: Recent Labs  Lab 09/05/22 1216 09/06/22 0508  PROCALCITON 6.40 5.31    No results found for this or any previous visit (from the past 240 hour(s)).       Radiology Studies: DG CHEST PORT 1 VIEW  Result Date: 09/05/2022 CLINICAL DATA:  10031, cough EXAM: PORTABLE CHEST 1 VIEW COMPARISON:  September 02, 2022 FINDINGS: The cardiomediastinal silhouette is unchanged in contour.Atherosclerotic calcifications of the aorta. No pleural effusion. No pneumothorax. Improved aeration of the RIGHT lung in comparison to prior with mild RIGHT greater than LEFT reticular nodularity. Biapical pleuroparenchymal thickening. Visualized abdomen is unremarkable. IMPRESSION: Improved aeration of the RIGHT lung in comparison to prior with mild residual reticular nodularity. Differential  considerations include infection, aspiration or atelectasis. Electronically Signed   By: Valentino Saxon M.D.   On: 09/05/2022 11:57        Scheduled Meds:  apixaban  2.5 mg Oral BID   azithromycin  500 mg Oral Daily   carvedilol  25 mg Oral BID WC   cilostazol  100 mg Oral BID   DULoxetine  20 mg Oral Daily   feeding supplement  237 mL Oral BID BM   fluticasone  2 spray Each Nare Daily   furosemide  40 mg Oral Daily   hydrALAZINE  100 mg Oral TID   isosorbide mononitrate  60 mg Oral Daily   levothyroxine  50 mcg Oral Q0600   loratadine  10 mg Oral Daily   LORazepam  0.5 mg Oral BID   mirtazapine  15 mg Oral QHS   mouth rinse  15 mL Mouth Rinse 4 times per day   pantoprazole  40 mg Oral Daily   rosuvastatin  20 mg Oral QHS   sodium chloride flush  3 mL Intravenous Q12H   Continuous Infusions:  cefTRIAXone (ROCEPHIN)  IV 2 g (09/05/22 1620)     LOS: 3 days    Time spent: 45 minutes spent on chart review, discussion with nursing staff, consultants, updating family and interview/physical exam; more than 50% of that time was spent in counseling and/or coordination of care.    Geradine Girt,  DO [facility] Available via Epic secure chat 7am-7pm After these hours, please refer to coverage provider listed on [email or web address] 09/06/2022, 12:18 PM

## 2022-09-07 ENCOUNTER — Inpatient Hospital Stay (HOSPITAL_COMMUNITY): Payer: Medicare Other

## 2022-09-07 DIAGNOSIS — I5033 Acute on chronic diastolic (congestive) heart failure: Secondary | ICD-10-CM | POA: Diagnosis not present

## 2022-09-07 LAB — RENAL FUNCTION PANEL
Albumin: 2.3 g/dL — ABNORMAL LOW (ref 3.5–5.0)
Anion gap: 11 (ref 5–15)
BUN: 64 mg/dL — ABNORMAL HIGH (ref 8–23)
CO2: 21 mmol/L — ABNORMAL LOW (ref 22–32)
Calcium: 8.1 mg/dL — ABNORMAL LOW (ref 8.9–10.3)
Chloride: 106 mmol/L (ref 98–111)
Creatinine, Ser: 5.09 mg/dL — ABNORMAL HIGH (ref 0.44–1.00)
GFR, Estimated: 8 mL/min — ABNORMAL LOW (ref >60)
Glucose, Bld: 121 mg/dL — ABNORMAL HIGH (ref 70–99)
Phosphorus: 5.8 mg/dL — ABNORMAL HIGH (ref 2.5–4.6)
Potassium: 3.7 mmol/L (ref 3.5–5.1)
Sodium: 138 mmol/L (ref 135–145)

## 2022-09-07 LAB — CBC
HCT: 22.2 % — ABNORMAL LOW (ref 36.0–46.0)
Hemoglobin: 7.1 g/dL — ABNORMAL LOW (ref 12.0–15.0)
MCH: 28 pg (ref 26.0–34.0)
MCHC: 32 g/dL (ref 30.0–36.0)
MCV: 87.4 fL (ref 80.0–100.0)
Platelets: 255 10*3/uL (ref 150–400)
RBC: 2.54 MIL/uL — ABNORMAL LOW (ref 3.87–5.11)
RDW: 16.5 % — ABNORMAL HIGH (ref 11.5–15.5)
WBC: 9.9 10*3/uL (ref 4.0–10.5)
nRBC: 0 % (ref 0.0–0.2)

## 2022-09-07 LAB — PROCALCITONIN: Procalcitonin: 3.37 ng/mL

## 2022-09-07 MED ORDER — HYDRALAZINE HCL 20 MG/ML IJ SOLN
10.0000 mg | Freq: Four times a day (QID) | INTRAMUSCULAR | Status: DC | PRN
  Administered 2022-09-08 – 2022-09-11 (×5): 10 mg via INTRAVENOUS
  Filled 2022-09-07 (×5): qty 1

## 2022-09-07 MED ORDER — RISAQUAD PO CAPS
1.0000 | ORAL_CAPSULE | Freq: Every day | ORAL | Status: DC
  Administered 2022-09-07 – 2022-09-17 (×10): 1 via ORAL
  Filled 2022-09-07 (×11): qty 1

## 2022-09-07 NOTE — Progress Notes (Signed)
Pt had episode of emesis with partially digested food and medications. Pt states she has discomfort with breathing but does not want to be suctioned at this time. Trach stoma patent and oxygenation status >92%.

## 2022-09-07 NOTE — Consult Note (Addendum)
NAME:  ADRIAN SPECHT, MRN:  [id number], DOB:  [birth year], LOS: 4 ADMISSION DATE:  09/02/2022, CONSULTATION DATE:  9/25 REFERRING MD:  Eliseo Squires, CHIEF COMPLAINT: Hemoptysis  History of Present Illness:  76 year old female with a complicated past medical history admitted on September 02, 2022 in the setting of acute respiratory failure with hypoxemia secondary to a CHF exacerbation.  Noted to have worsening renal insufficiency and evidence of demand ischemia.  Nephrology and cardiology were consulted. Pulmonary and critical care medicine was consulted on September 07, 2022 in the setting of hemoptysis from her tracheal stoma.  The patient has a remote history of laryngectomy for head neck cancer.  During this admission she is required some suctioning from her tracheal stoma.  This morning she was noted to be coughing up bright red blood.  She denies respiratory distress.  However, she says that she does have some trouble swallowing and she feels some tenderness in her neck.  She also notes some mild stomach pain.  She denies chest pain.  Of note, the patient had an esophageal dilation as well as tracheal stoma dilation by ENT at [facility] on July 16, 2022.  Pertinent  Medical History  Atrial fibrillation Hypertension Anxiety Depression Hyperlipidemia PAD COPD Chronic pain Dysphagia CKD stage IV GERD Hypothyroidism History of head and neck cancer status post laryngectomy Anemia History of stroke  Significant Hospital Events: Including procedures, antibiotic start and stop dates in addition to other pertinent events   September 02, 2022 admitted for CHF exacerbation September 07, 2022 hemoptysis after tracheal suctioning through stoma  Interim History / Subjective:  As above  Objective   Blood pressure (!) 183/53, pulse 95, temperature 98.3 F (36.8 C), temperature source Oral, resp. rate 18, height '5\' 3"'$  (1.6 m), weight 50.4 kg, SpO2 93 %.    FiO2 (%):  [28 %-40 %] 28  %   Intake/Output Summary (Last 24 hours) at 09/07/2022 1048 Last data filed at 09/07/2022 0540 Gross per 24 hour  Intake 1550 ml  Output 700 ml  Net 850 ml   Filed Weights   09/04/22 0530 09/05/22 0604 09/06/22 0500  Weight: 53.5 kg 52.3 kg 50.4 kg    Examination:  General:  Resting comfortably in bed HENT: NCAT OP clear, tracheal stoma with dried blood, no active bleeding; no palpable crepitance, however she has some focal tenderness on the left neck PULM: CTA B, normal effort CV: RRR, no mgr GI: BS+, soft, nontender MSK: normal bulk and tone Neuro: awake, alert, no distress, MAEW    Resolved Hospital Problem list     Assessment & Plan:  Hemoptysis Head and neck cancer, status post remote laryngectomy History of esophageal stricture Atrial fibrillation on Eliquis CHF, acute exacerbation of diastolic heart failure Demand ischemia  Discussion: She has a hemoptysis due to tracheal injury from suction trauma.  I am concerned because she complains of odynophagia, and has palpable neck tenderness.  There is no crepitance on exam however, I am concerned about the possibility of posterior tracheal wall injury.  I discussed this with radiology and they have recommended a CT scan of her neck.  Unfortunately, we cannot use IV contrast.  Plan: Hold Eliquis CT scan of neck and soft tissue without contrast now Hold off on suctioning trachea to only when absolutely necessary Continue rest of care as per routine N.p.o. for now  Best Practice (right click and "Reselect all SmartList Selections" daily)   Per [facility]  Labs   CBC: Recent  Labs  Lab 09/02/22 1827 09/03/22 0154 09/04/22 0459 09/06/22 0508 09/07/22 0850  WBC 9.6 8.4 7.4 9.2 9.9  NEUTROABS 8.5*  --   --   --   --   HGB 8.2* 7.7* 7.7* 7.7* 7.1*  HCT 27.0* 24.6* 24.4* 25.0* 22.2*  MCV 89.4 90.4 88.7 89.0 87.4  PLT 170 172 196 242 749    Basic Metabolic Panel: Recent Labs  Lab 09/02/22 1827 09/03/22 0154  09/04/22 0459 09/06/22 0508 09/07/22 0440  NA 142 142 139 139 138  K 4.9 4.4 4.3 4.4 3.7  CL 115* 114* 109 108 106  CO2 19* 18* 19* 18* 21*  GLUCOSE 100* 109* 101* 103* 121*  BUN 54* 59* 59* 65* 64*  CREATININE 3.56* 3.68* 4.06* 4.96*  4.88* 5.09*  CALCIUM 8.6* 8.2* 8.4* 8.5* 8.1*  MG  --  1.6* 2.3  --   --   PHOS  --   --   --  7.6* 5.8*   GFR: Estimated Creatinine Clearance: 7.5 mL/min (A) (by C-G formula based on SCr of 5.09 mg/dL (H)). Recent Labs  Lab 09/03/22 0154 09/04/22 0459 09/05/22 1216 09/06/22 0508 09/07/22 0440 09/07/22 0850  PROCALCITON  --   --  6.40 5.31 3.37  --   WBC 8.4 7.4  --  9.2  --  9.9    Liver Function Tests: Recent Labs  Lab 09/02/22 1827 09/03/22 0154 09/06/22 0508 09/07/22 0440  AST 16 15  --   --   ALT 12 12  --   --   ALKPHOS 71 65  --   --   BILITOT 0.5 0.4  --   --   PROT 5.6* 5.3*  --   --   ALBUMIN 2.7* 2.4* 2.5* 2.3*   No results for input(s): "LIPASE", "AMYLASE" in the last 168 hours. No results for input(s): "AMMONIA" in the last 168 hours.  ABG    Component Value Date/Time   TCO2 23 09/27/2012 1409     Coagulation Profile: No results for input(s): "INR", "PROTIME" in the last 168 hours.  Cardiac Enzymes: No results for input(s): "CKTOTAL", "CKMB", "CKMBINDEX", "TROPONINI" in the last 168 hours.  HbA1C: Hgb A1c MFr Bld  Date/Time Value Ref Range Status  08/07/2022 05:06 AM 5.5 4.8 - 5.6 % Final    Comment:    (NOTE) Pre diabetes:          5.7%-6.4%  Diabetes:              >6.4%  Glycemic control for   <7.0% adults with diabetes   12/27/2020 09:11 AM 5.2 4.8 - 5.6 % Final    Comment:    (NOTE) Pre diabetes:          5.7%-6.4%  Diabetes:              >6.4%  Glycemic control for   <7.0% adults with diabetes     CBG: No results for input(s): "GLUCAP" in the last 168 hours.  Review of Systems:   Gen: Denies fever, chills, weight change, fatigue, night sweats HEENT: Denies blurred vision, double  vision, hearing loss, tinnitus, sinus congestion, rhinorrhea, sore throat, neck stiffness, dysphagia PULM: per HPI CV: Denies chest pain, edema, orthopnea, paroxysmal nocturnal dyspnea, palpitations GI: per HPI GU: Denies dysuria, hematuria, polyuria, oliguria, urethral discharge Endocrine: Denies hot or cold intolerance, polyuria, polyphagia or appetite change Derm: Denies rash, dry skin, scaling or peeling skin change Heme: Denies easy bruising, bleeding, bleeding gums Neuro: Denies  headache, numbness, weakness, slurred speech, loss of memory or consciousness   Past Medical History:  She,  has a past medical history of Anemia, Anxiety, Blood transfusion without reported diagnosis (09/15/12), Broken ribs, Chronic back pain, CKD (chronic kidney disease), stage IV ([place]), Constipation, COPD (chronic obstructive pulmonary disease) ([place]) (08/10/2012), Depression, Gastrostomy in place [facility]), GERD (gastroesophageal reflux disease), Headache(784.0), Hiatal hernia (08/10/2012), History of radiation therapy (10/17/12-11/25/12), Hypercholesteremia, Hypertension, Insomnia, Nausea, PAD (peripheral artery disease) ([place]), Pneumonia, 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 (08/08/2012), Shortness of breath dyspnea, Stroke ([place]) (2011), and Uterine cancer ([place]).   Surgical History:   Past Surgical History:  Procedure Laterality Date   ABDOMINAL SURGERY     r/t uterine carcinoma   APPENDECTOMY     DIRECT LARYNGOSCOPY N/A 05/22/2014   Procedure: DIRECT LARYNGOSCOPY WITH ESOPHAGEAL DILATION;  Surgeon: Jerrell Belfast, MD;  Location: [facility] OR;  Service: ENT;  Laterality: N/A;   ESOPHAGOSCOPY WITH DILITATION N/A 05/29/2015   Procedure: ESOPHAGOSCOPY WITH ESOPHAGEAL DILITATION;  Surgeon: Jerrell Belfast, MD;  Location: [place];  Service: ENT;  Laterality: N/A;   Gastrostomy Tube removed   2013   GASTROSTOMY W/ FEEDING TUBE  13   HERNIA REPAIR     child   LARYNGETOMY  08/31/2012   Procedure: LARYNGECTOMY;   Surgeon: Jerrell Belfast, MD;  Location: [place];  Service: ENT;  Laterality: N/A;   LARYNGOSCOPY  08/10/2012   Procedure: LARYNGOSCOPY;  Surgeon: Jerrell Belfast, MD;  Location: [facility];  Service: ENT;  Laterality: N/A;  with biopsy   RADICAL NECK DISSECTION  08/31/2012   Procedure: RADICAL NECK DISSECTION;  Surgeon: Jerrell Belfast, MD;  Location: [place];  Service: ENT;  Laterality: Bilateral;   TRACHEAL ESOPHOGEAL PUNCTURE WITH REPAIR STOMA N/A 09/08/2013   Procedure: TRACHEAL ESOPHOGEAL PUNCTURE WITH PLACEMENT OF  PROVOX PROSTHESIS ;  Surgeon: Jerrell Belfast, MD;  Location: [place];  Service: ENT;  Laterality: N/A;   TRACHEOSTOMY TUBE PLACEMENT  08/10/2012   Procedure: TRACHEOSTOMY;  Surgeon: Jerrell Belfast, MD;  Location: [facility];  Service: ENT;  Laterality: N/A;     Social History:   reports that she quit smoking about 9 years ago. Her smoking use included cigarettes. She has a 12.50 pack-year smoking history. She has never used smokeless tobacco. She reports current alcohol use. She reports that she does not use drugs.   Family History:  Her family history includes Brain cancer in her brother; Throat cancer in her father. There is no history of CAD.   Allergies Allergies  Allergen Reactions   Xyzal [Levocetirizine Dihydrochloride] Itching   Augmentin [Amoxicillin-Pot Clavulanate] Diarrhea and Nausea And Vomiting   Tribenzor [Olmesartan-Amlodipine-Hctz] Other (See Comments)    "Hurt the kidneys"     Home Medications  Prior to Admission medications   Medication Sig Start Date End Date Taking? Authorizing Provider  acetaminophen (TYLENOL) 500 MG tablet Take 1,000 mg by mouth as needed for headache.   Yes [provider]  apixaban (ELIQUIS) 2.5 MG TABS tablet Take 1 tablet (2.5 mg total) by mouth 2 (two) times daily. 02/10/22  Yes Hoyt Koch, MD  carvedilol (COREG) 25 MG tablet TAKE 1 TABLET (25 MG TOTAL) BY MOUTH TWICE A DAY WITH MEALS Patient taking differently: Take 25  mg by mouth 2 (two) times daily with a meal. 05/26/22  Yes Hoyt Koch, MD  cilostazol (PLETAL) 100 MG tablet TAKE 1 TABLET BY MOUTH TWICE A DAY Patient taking differently: Take 100 mg by mouth 2 (two) times  daily. 02/03/22  Yes Hoyt Koch, MD  docusate sodium (COLACE) 100 MG capsule Take 1 capsule (100 mg total) by mouth daily. 01/09/22  Yes Little Ishikawa, MD  DULoxetine (CYMBALTA) 20 MG capsule TAKE 1 CAPSULE BY MOUTH EVERY DAY Patient taking differently: Take 20 mg by mouth daily. 07/23/22  Yes Hoyt Koch, MD  fluticasone (FLONASE) 50 MCG/ACT nasal spray SPRAY 2 SPRAYS INTO EACH NOSTRIL EVERY DAY Patient taking differently: Place 2 sprays into both nostrils daily. 01/31/22  Yes Janith Lima, MD  furosemide (LASIX) 20 MG tablet Take 1 tablet (20 mg total) by mouth daily as needed. 05/20/22  Yes Hoyt Koch, MD  hydrALAZINE (APRESOLINE) 100 MG tablet Take 1 tablet (100 mg total) by mouth 3 (three) times daily. 03/03/22  Yes Hoyt Koch, MD  isosorbide mononitrate (IMDUR) 60 MG 24 hr tablet TAKE 1 TABLET BY MOUTH EVERY DAY Patient taking differently: Take 60 mg by mouth daily. 07/29/22  Yes Hoyt Koch, MD  levothyroxine (SYNTHROID) 50 MCG tablet TAKE 1 TABLET BY MOUTH EVERY DAY BEFORE BREAKFAST Patient taking differently: Take 50 mcg by mouth daily before breakfast. 04/30/22  Yes Hoyt Koch, MD  loperamide (IMODIUM A-D) 2 MG tablet Take 2 mg by mouth as needed for diarrhea or loose stools.   Yes [provider]  LORazepam (ATIVAN) 0.5 MG tablet Take 0.5 mg by mouth 2 (two) times daily. 08/22/22  Yes [provider]  LORazepam (ATIVAN) 1 MG tablet Take 1 tablet (1 mg total) by mouth 2 (two) times daily as needed for anxiety. 08/13/22  Yes Hoyt Koch, MD  mirtazapine (REMERON) 15 MG tablet Take 15 mg by mouth at bedtime.   Yes [provider]  ondansetron (ZOFRAN) 4 MG tablet TAKE 1 TABLET BY  MOUTH EVERY 8 HOURS AS NEEDED FOR NAUSEA AND VOMITING Patient taking differently: Take 4 mg by mouth as needed for nausea or vomiting. 02/03/22  Yes Hoyt Koch, MD  rosuvastatin (CRESTOR) 20 MG tablet TAKE 1 TABLET (20 MG TOTAL) BY MOUTH DAILY. Patient taking differently: Take 20 mg by mouth at bedtime. 07/29/22  Yes Hoyt Koch, MD  amLODipine (NORVASC) 10 MG tablet Take 1 tablet (10 mg total) by mouth daily. Patient not taking: Reported on 09/02/2022 01/09/22   Little Ishikawa, MD  Ensure (ENSURE) Take 237 mLs by mouth daily.    [provider]  predniSONE (DELTASONE) 20 MG tablet Days 1-7 take 2 pills daily. Days 8-14 take 1 pill daily Patient not taking: Reported on 09/02/2022 08/13/22   Hoyt Koch, MD  levocetirizine Harlow Ohms) 5 MG tablet TAKE 1 TABLET(5 MG) BY MOUTH EVERY EVENING 02/21/20 06/10/20  Janith Lima, MD     Critical care time: n/a    Roselie Awkward, MD [facility] Pager: [phone] Cell: [phone] After 7:00 pm call [place]  [phone]

## 2022-09-07 NOTE — Progress Notes (Signed)
PROGRESS NOTE    [name]  BHA:[id number] DOB: [birth year] DOA: 09/02/2022 PCP: Hoyt Koch, MD    Brief Narrative:  [name] is a 76 y.o. female with medical history significant of atrial fibrillation, hypertension, anxiety, depression, hyperlipidemia, PAD, COPD, chronic pain, dysphagia, CKD 4, GERD, hypothyroidism, history of head neck cancer status post laryngectomy, anemia, CVA presenting with ongoing shortness of breath and weakness.  Does not wear O2 at home.  Stay complicated by worsening renal function and bleeding from stoma.     Assessment and Plan: Acute on chronic diastolic CHF Hypertensive emergency Demand ischemia > Patient presenting with ongoing shortness breath and weakness present since recent admission around a month ago.  No obvious cause at that time other than her hypertension. > On this presentation patient noted to have newly elevated BNP to 1263.  Also similarly elevated troponin at 1079 and then improved to 890 on repeat. > Also significantly hypertensive on presentation to 973Z systolic did not respond to initial IV hydralazine in the ED and on second dose reduced to the 140s. > Presentation consistent with hypertensive emergency/CHF exacerbation.  BNP points for CHF and this is new.  Demand ischemia is a likely etiology for troponin which is intermittently elevated and is without chest pain.  No indication for heparin at this time per cardiology was consulted in the ED. -cards consult- they defer to nephrology to  manage outpatient -per daughter in law may not have been taking her meds consistently   Acute resp failure with hypoxia -does not wear O2 at home -wean to RA as able -pulm toilet -x ray with some improvement -? PNA-- pro calcitonin elevated -IV abx and trend  Bleeding from stoma -CT scan pending -PCCM pending  A-fib - Continue home carvedilol and Eliquis (holding eliquis) -elquis to be held 3 days prior to procedure  ONLY   Hyperlipidemia PAD - Continue home rosuvastatin, cilostazol   Worsening CKD 4 > Creatinine stable - Trend renal function and electrolytes - will ask nephrology to see (has seen Dr. Candiss Norse in the past) -urine studies pending   Anemia-unclear etiology -outpatient follow up- prob CKD > Hemoglobin trending down   tremor -outpatient referral to neurology -will also need testing for dementia  Hypothyroidism - Continue home Synthroid   Hypomagnesemia -replete  Anxiety Depression - Continue home fluoxetine, Remeron, Ativan -daughter in law states she had been taking more ativan than prescribed   COPD > History of this however do not see any inhalers in chart.   History of head/neck cancer  > Status post laryngectomy with trach in place. - Noted -due to have stoma stretched in October-- I reached out to [facility] and she needs to hold her eliquis 3 days prior to procedure   PT Eval- patient refused- says she will participate if not done after suctioning  DVT prophylaxis: Place and maintain sequential compression device Start: 09/07/22 1154    Code Status: Full Code Family Communication: daughter in law - called Emmalyne Giacomo  Disposition Plan:  Level of care: [facility] when stable    Consultants:  cards   Subjective: Bleeding from stoma overnight  Objective: Vitals:   09/07/22 0712 09/07/22 0819 09/07/22 1100 09/07/22 1200  BP: (!) 183/53   137/64  Pulse: 88 95 84 88  Resp:  18 16 (!) 21  Temp:    98.6 F (37 C)  TempSrc:    Oral  SpO2:  93% 95% 99%  Weight:  Height:        Intake/Output Summary (Last 24 hours) at 09/07/2022 1239 Last data filed at 09/07/2022 0540 Gross per 24 hour  Intake 1550 ml  Output 700 ml  Net 850 ml   Filed Weights   09/04/22 0530 09/05/22 0604 09/06/22 0500  Weight: 53.5 kg 52.3 kg 50.4 kg    Examination:    General: Appearance:    Thin female in no acute distress     Lungs:     On [place], respirations  unlabored  Heart:    Normal heart rate.   MS:   All extremities are intact.   Neurologic:   Awake, alert       Data Reviewed: I have personally reviewed following labs and imaging studies  CBC: Recent Labs  Lab 09/02/22 1827 09/03/22 0154 09/04/22 0459 09/06/22 0508 09/07/22 0850  WBC 9.6 8.4 7.4 9.2 9.9  NEUTROABS 8.5*  --   --   --   --   HGB 8.2* 7.7* 7.7* 7.7* 7.1*  HCT 27.0* 24.6* 24.4* 25.0* 22.2*  MCV 89.4 90.4 88.7 89.0 87.4  PLT 170 172 196 242 144   Basic Metabolic Panel: Recent Labs  Lab 09/02/22 1827 09/03/22 0154 09/04/22 0459 09/06/22 0508 09/07/22 0440  NA 142 142 139 139 138  K 4.9 4.4 4.3 4.4 3.7  CL 115* 114* 109 108 106  CO2 19* 18* 19* 18* 21*  GLUCOSE 100* 109* 101* 103* 121*  BUN 54* 59* 59* 65* 64*  CREATININE 3.56* 3.68* 4.06* 4.96*  4.88* 5.09*  CALCIUM 8.6* 8.2* 8.4* 8.5* 8.1*  MG  --  1.6* 2.3  --   --   PHOS  --   --   --  7.6* 5.8*   GFR: Estimated Creatinine Clearance: 7.5 mL/min (A) (by C-G formula based on SCr of 5.09 mg/dL (H)). Liver Function Tests: Recent Labs  Lab 09/02/22 1827 09/03/22 0154 09/06/22 0508 09/07/22 0440  AST 16 15  --   --   ALT 12 12  --   --   ALKPHOS 71 65  --   --   BILITOT 0.5 0.4  --   --   PROT 5.6* 5.3*  --   --   ALBUMIN 2.7* 2.4* 2.5* 2.3*   No results for input(s): "LIPASE", "AMYLASE" in the last 168 hours. No results for input(s): "AMMONIA" in the last 168 hours. Coagulation Profile: No results for input(s): "INR", "PROTIME" in the last 168 hours. Cardiac Enzymes: No results for input(s): "CKTOTAL", "CKMB", "CKMBINDEX", "TROPONINI" in the last 168 hours. BNP (last 3 results) No results for input(s): "PROBNP" in the last 8760 hours. HbA1C: No results for input(s): "HGBA1C" in the last 72 hours. CBG: No results for input(s): "GLUCAP" in the last 168 hours. Lipid Profile: No results for input(s): "CHOL", "HDL", "LDLCALC", "TRIG", "CHOLHDL", "LDLDIRECT" in the last 72 hours. Thyroid  Function Tests: No results for input(s): "TSH", "T4TOTAL", "FREET4", "T3FREE", "THYROIDAB" in the last 72 hours. Anemia Panel: No results for input(s): "VITAMINB12", "FOLATE", "FERRITIN", "TIBC", "IRON", "RETICCTPCT" in the last 72 hours. Sepsis Labs: Recent Labs  Lab 09/05/22 1216 09/06/22 0508 09/07/22 0440  PROCALCITON 6.40 5.31 3.37    No results found for this or any previous visit (from the past 240 hour(s)).       Radiology Studies: US RENAL  Result Date: 09/06/2022 CLINICAL DATA:  Renal failure EXAM: RENAL / URINARY TRACT ULTRASOUND COMPLETE COMPARISON:  CT 07/04/2021 FINDINGS: Right Kidney: Renal measurements: 10.2 x  4.1 x 4.4 cm = volume: 97 mL. Increased cortical echogenicity. There are benign-appearing cystic masses measuring 3.6 and 1.6 cm respectively. No follow-up is required. Left Kidney: Renal measurements: 10.9 x 5.2 x 4.3 cm = volume: 127 mL. Increased cortical echogenicity. There are benign-appearing cystic masses measuring 3.3 and 3.0 cm, respectively. No follow-up is required. Bladder: Appears normal for degree of bladder distention. Other: None. IMPRESSION: Increased echogenicity of the bilateral kidneys compatible with medical renal disease. Electronically Signed   By: Placido Sou M.D.   On: 09/06/2022 23:15        Scheduled Meds:  acidophilus  1 capsule Oral Daily   azithromycin  500 mg Oral Daily   carvedilol  25 mg Oral BID WC   cilostazol  100 mg Oral BID   DULoxetine  20 mg Oral Daily   feeding supplement  237 mL Oral BID BM   fluticasone  2 spray Each Nare Daily   hydrALAZINE  100 mg Oral TID   isosorbide mononitrate  60 mg Oral Daily   levothyroxine  50 mcg Oral Q0600   loratadine  10 mg Oral Daily   LORazepam  0.5 mg Oral BID   mirtazapine  15 mg Oral QHS   mouth rinse  15 mL Mouth Rinse 4 times per day   pantoprazole  40 mg Oral Daily   rosuvastatin  20 mg Oral QHS   sodium chloride flush  3 mL Intravenous Q12H   Continuous  Infusions:  cefTRIAXone (ROCEPHIN)  IV 2 g (09/06/22 1548)   sodium bicarbonate 150 mEq in dextrose 5 % 1,150 mL infusion 85 mL/hr at 09/07/22 0456     LOS: 4 days    Time spent: 45 minutes spent on chart review, discussion with nursing staff, consultants, updating family and interview/physical exam; more than 50% of that time was spent in counseling and/or coordination of care.    Geradine Girt, DO [facility] Available via Epic secure chat 7am-7pm After these hours, please refer to coverage provider listed on [email or web address] 09/07/2022, 12:39 PM

## 2022-09-07 NOTE — Progress Notes (Signed)
PT Cancellation Note  Patient Details Name: [name] MRN: [id number] DOB: [birth year]   Cancelled Treatment:    Reason Eval/Treat Not Completed: Patient declined, no reason specified Pt refused this morning however agreeable for mobilizing this afternoon.  Upon checking back, pt being taken for CT.    Myrtis Hopping [place] 09/07/2022, 2:04 PM Arlyce Dice, DPT Physical Therapist [facility] Preferred contact method: Secure Chat Weekend Pager Only: [phone] Office: [phone]

## 2022-09-07 NOTE — Progress Notes (Signed)
[facility] Progress Note   Assessment/ Plan:   Assessment/ Plan: AKI on CKD 5 - b/l creat is 3.2- 3.6 from august 2023, eGFR 12-14 ml/min. Creat here was 3.5 on admission, which is in normal range for her. With diuresis for suspected CHF the CXR changes have improved, but also the creat has increased up to 4.0 yest and 4.8 today. - renal US negative for obstruction - appears to be plateauing with IV bicarb gtt - continue for another 24 hrs or so HTNsive urgency - BP's a bit labile, previously but appear OK for now Atrial fib COPD Hx head/ neck cancer / sp laryngectomy w/ trach in place Hemoptysis: pulmonary consulted, plan for CT of the neck  Subjective:    Seen in room.  Cr appears to be plateauing.  Had some hemoptysis from trach, CT pending   Objective:   BP 137/64 (BP Location: Left Arm)   Pulse 88   Temp 98.6 F (37 C) (Oral)   Resp (!) 21   Ht 5\' 3"  (1.6 m)   Wt 50.4 kg   SpO2 99%   BMI 19.68 kg/m   Intake/Output Summary (Last 24 hours) at 09/07/2022 1339 Last data filed at 09/07/2022 0540 Gross per 24 hour  Intake 1550 ml  Output 700 ml  Net 850 ml   Weight change:   Physical Exam: Gen:NAD, eating lunch NECK: + stoma CVS:RRR Resp: clear 09/09/2022  Ext: no LE edema  Imaging: YBF:XOVA RENAL  Result Date: 09/06/2022 CLINICAL DATA:  Renal failure EXAM: RENAL / URINARY TRACT ULTRASOUND COMPLETE COMPARISON:  CT 07/04/2021 FINDINGS: Right Kidney: Renal measurements: 10.2 x 4.1 x 4.4 cm = volume: 97 mL. Increased cortical echogenicity. There are benign-appearing cystic masses measuring 3.6 and 1.6 cm respectively. No follow-up is required. Left Kidney: Renal measurements: 10.9 x 5.2 x 4.3 cm = volume: 127 mL. Increased cortical echogenicity. There are benign-appearing cystic masses measuring 3.3 and 3.0 cm, respectively. No follow-up is required. Bladder: Appears normal for degree of bladder distention. Other: None. IMPRESSION: Increased echogenicity of the  bilateral kidneys compatible with medical renal disease. Electronically Signed   By: 07/06/2021 M.D.   On: 09/06/2022 23:15    Labs: BMET Recent Labs  Lab 09/02/22 1827 09/03/22 0154 09/04/22 0459 09/06/22 0508 09/07/22 0440  NA 142 142 139 139 138  K 4.9 4.4 4.3 4.4 3.7  CL 115* 114* 109 108 106  CO2 19* 18* 19* 18* 21*  GLUCOSE 100* 109* 101* 103* 121*  BUN 54* 59* 59* 65* 64*  CREATININE 3.56* 3.68* 4.06* 4.96*  4.88* 5.09*  CALCIUM 8.6* 8.2* 8.4* 8.5* 8.1*  PHOS  --   --   --  7.6* 5.8*   CBC Recent Labs  Lab 09/02/22 1827 09/03/22 0154 09/04/22 0459 09/06/22 0508 09/07/22 0850  WBC 9.6 8.4 7.4 9.2 9.9  NEUTROABS 8.5*  --   --   --   --   HGB 8.2* 7.7* 7.7* 7.7* 7.1*  HCT 27.0* 24.6* 24.4* 25.0* 22.2*  MCV 89.4 90.4 88.7 89.0 87.4  PLT 170 172 196 242 255    Medications:     acidophilus  1 capsule Oral Daily   azithromycin  500 mg Oral Daily   carvedilol  25 mg Oral BID WC   cilostazol  100 mg Oral BID   DULoxetine  20 mg Oral Daily   feeding supplement  237 mL Oral BID BM   fluticasone  2 spray Each Nare Daily  hydrALAZINE  100 mg Oral TID   isosorbide mononitrate  60 mg Oral Daily   levothyroxine  50 mcg Oral Q0600   loratadine  10 mg Oral Daily   LORazepam  0.5 mg Oral BID   mirtazapine  15 mg Oral QHS   mouth rinse  15 mL Mouth Rinse 4 times per day   pantoprazole  40 mg Oral Daily   rosuvastatin  20 mg Oral QHS   sodium chloride flush  3 mL Intravenous Q12H    Madelon Lips MD 09/07/2022, 1:39 PM

## 2022-09-08 ENCOUNTER — Inpatient Hospital Stay (HOSPITAL_COMMUNITY): Payer: Medicare Other

## 2022-09-08 DIAGNOSIS — I5033 Acute on chronic diastolic (congestive) heart failure: Secondary | ICD-10-CM | POA: Diagnosis not present

## 2022-09-08 LAB — BASIC METABOLIC PANEL
Anion gap: 12 (ref 5–15)
BUN: 54 mg/dL — ABNORMAL HIGH (ref 8–23)
CO2: 27 mmol/L (ref 22–32)
Calcium: 7.6 mg/dL — ABNORMAL LOW (ref 8.9–10.3)
Chloride: 98 mmol/L (ref 98–111)
Creatinine, Ser: 4.49 mg/dL — ABNORMAL HIGH (ref 0.44–1.00)
GFR, Estimated: 10 mL/min — ABNORMAL LOW (ref >60)
Glucose, Bld: 142 mg/dL — ABNORMAL HIGH (ref 70–99)
Potassium: 3.2 mmol/L — ABNORMAL LOW (ref 3.5–5.1)
Sodium: 137 mmol/L (ref 135–145)

## 2022-09-08 LAB — URINALYSIS, ROUTINE W REFLEX MICROSCOPIC
Bilirubin Urine: NEGATIVE
Glucose, UA: NEGATIVE mg/dL
Hgb urine dipstick: NEGATIVE
Ketones, ur: NEGATIVE mg/dL
Leukocytes,Ua: NEGATIVE
Nitrite: NEGATIVE
Protein, ur: >=300 mg/dL — AB
Specific Gravity, Urine: 1.01 (ref 1.005–1.030)
pH: 7 (ref 5.0–8.0)

## 2022-09-08 LAB — CBC
HCT: 22.7 % — ABNORMAL LOW (ref 36.0–46.0)
Hemoglobin: 7.1 g/dL — ABNORMAL LOW (ref 12.0–15.0)
MCH: 27.6 pg (ref 26.0–34.0)
MCHC: 31.3 g/dL (ref 30.0–36.0)
MCV: 88.3 fL (ref 80.0–100.0)
Platelets: 249 10*3/uL (ref 150–400)
RBC: 2.57 MIL/uL — ABNORMAL LOW (ref 3.87–5.11)
RDW: 16.1 % — ABNORMAL HIGH (ref 11.5–15.5)
WBC: 9.7 10*3/uL (ref 4.0–10.5)
nRBC: 0 % (ref 0.0–0.2)

## 2022-09-08 LAB — CREATININE, URINE, RANDOM: Creatinine, Urine: 66 mg/dL

## 2022-09-08 LAB — TROPONIN I (HIGH SENSITIVITY)
Troponin I (High Sensitivity): 992 ng/L (ref <18)
Troponin I (High Sensitivity): 1378 ng/L (ref <18)

## 2022-09-08 LAB — MRSA NEXT GEN BY PCR, NASAL: MRSA by PCR Next Gen: DETECTED — AB

## 2022-09-08 LAB — SODIUM, URINE, RANDOM: Sodium, Ur: 72 mmol/L

## 2022-09-08 MED ORDER — SODIUM CHLORIDE 0.9 % IV SOLN: INTRAVENOUS | Status: DC | PRN

## 2022-09-08 MED ORDER — MUPIROCIN 2 % EX OINT
1.0000 | TOPICAL_OINTMENT | Freq: Two times a day (BID) | CUTANEOUS | Status: AC
  Administered 2022-09-08 – 2022-09-13 (×11): 1 via NASAL
  Filled 2022-09-08: qty 22

## 2022-09-08 MED ORDER — HYDROMORPHONE HCL 1 MG/ML IJ SOLN
0.5000 mg | INTRAMUSCULAR | Status: DC | PRN
  Administered 2022-09-08: 0.5 mg via INTRAVENOUS
  Administered 2022-09-10 – 2022-09-13 (×6): 1 mg via INTRAVENOUS
  Filled 2022-09-08 (×7): qty 1

## 2022-09-08 MED ORDER — ONDANSETRON HCL 4 MG/2ML IJ SOLN
4.0000 mg | Freq: Four times a day (QID) | INTRAMUSCULAR | Status: DC | PRN
  Administered 2022-09-08 – 2022-09-13 (×3): 4 mg via INTRAVENOUS
  Filled 2022-09-08 (×3): qty 2

## 2022-09-08 MED ORDER — LORAZEPAM 2 MG/ML IJ SOLN: 1.0000 mg | Freq: Once | INTRAMUSCULAR | Status: DC

## 2022-09-08 MED ORDER — CHLORHEXIDINE GLUCONATE CLOTH 2 % EX PADS
6.0000 | MEDICATED_PAD | Freq: Every day | CUTANEOUS | Status: DC
  Administered 2022-09-08 – 2022-09-17 (×9): 6 via TOPICAL

## 2022-09-08 MED ORDER — CLEVIDIPINE BUTYRATE 0.5 MG/ML IV EMUL
0.0000 mg/h | INTRAVENOUS | Status: DC
  Administered 2022-09-08: 2 mg/h via INTRAVENOUS
  Administered 2022-09-08: 9 mg/h via INTRAVENOUS
  Administered 2022-09-08: 8 mg/h via INTRAVENOUS
  Administered 2022-09-08: 9 mg/h via INTRAVENOUS
  Filled 2022-09-08 (×7): qty 50

## 2022-09-08 MED ORDER — LORAZEPAM 2 MG/ML IJ SOLN
0.5000 mg | Freq: Two times a day (BID) | INTRAMUSCULAR | Status: DC | PRN
  Administered 2022-09-08 – 2022-09-12 (×3): 0.5 mg via INTRAVENOUS
  Filled 2022-09-08 (×3): qty 1

## 2022-09-08 MED ORDER — HYDRALAZINE HCL 20 MG/ML IJ SOLN
10.0000 mg | Freq: Once | INTRAMUSCULAR | Status: AC
  Administered 2022-09-08: 10 mg via INTRAVENOUS
  Filled 2022-09-08: qty 1

## 2022-09-08 MED ORDER — MORPHINE SULFATE (PF) 2 MG/ML IV SOLN: 1.0000 mg | INTRAVENOUS | Status: DC | PRN

## 2022-09-08 MED ORDER — TRANEXAMIC ACID FOR INHALATION
500.0000 mg | Freq: Once | RESPIRATORY_TRACT | Status: AC
  Administered 2022-09-08: 500 mg via RESPIRATORY_TRACT
  Filled 2022-09-08: qty 10

## 2022-09-08 MED ORDER — LABETALOL HCL 5 MG/ML IV SOLN
10.0000 mg | Freq: Once | INTRAVENOUS | Status: AC
  Administered 2022-09-08: 10 mg via INTRAVENOUS
  Filled 2022-09-08: qty 4

## 2022-09-08 MED ORDER — POTASSIUM CHLORIDE CRYS ER 20 MEQ PO TBCR
20.0000 meq | EXTENDED_RELEASE_TABLET | Freq: Once | ORAL | Status: DC
  Filled 2022-09-08: qty 1

## 2022-09-08 NOTE — Progress Notes (Signed)
Chaplain received a referral from the Rapid Response nurse to provide support to [facility].  [name] was very anxious after the rapid response event and was still having some heaviness in her chest which was making it difficult to breathe, no doubt adding to her anxiety.  Her daughter-in-law and soon after, her son, were both present with her.  They stated that she has been through a lot with her health and would benefit from support, but that later might be better. Chaplain attempted follow up about 2 hours later, but [name] was resting.    [address], [place] Pager, [phone]

## 2022-09-08 NOTE — Progress Notes (Signed)
[facility] Progress Note   Assessment/ Plan:   Assessment/ Plan: AKI on CKD 5 - b/l creat is 3.2- 3.6 from august 2023, eGFR 12-14 ml/min. Creat here was 3.5 on admission, which is in normal range for her. With diuresis for suspected CHF the CXR changes have improved, but also the creat has increased up to 4.0 yest and 4.8 today. - renal US negative for obstruction - off IVFs, Cr improved HTNsive urgency : initally were high, then got better.  Overnight BP up again with some headache.  Can't take pills d/t nausea.  Started on cleviprex gtt.   Atrial fib COPD Hx head/ neck cancer / sp laryngectomy w/ stoma Hemoptysis: pulmonary consulted, plan for CT of the neck-- no tracheal injury.  Appears to be laceration on stoma and not coming from lungs.  Subjective:    Nauseated overnight, BP up, couldn't take pills.  Started on cleviprex gtt and transferred to ICU.  IVFs stopped as well, CXR without edema/ volume overload.     Objective:   BP (!) 241/85   Pulse 100   Temp 98.1 F (36.7 C) (Oral)   Resp 18   Ht $R'5\' 3"'rL$  (1.6 m)   Wt 50.4 kg   SpO2 96%   BMI 19.68 kg/m   Intake/Output Summary (Last 24 hours) at 09/08/2022 1338 Last data filed at 09/07/2022 2357 Gross per 24 hour  Intake 100 ml  Output 302 ml  Net -202 ml   Weight change:   Physical Exam: Gen: appears very anxious NECK: + stoma with old dried blood around CVS:RRR Resp: clear YOV:ZCHY  Ext: no LE edema  Imaging: DG CHEST PORT 1 VIEW  Result Date: 09/08/2022 CLINICAL DATA:  Hypoxia, COPD EXAM: PORTABLE CHEST 1 VIEW COMPARISON:  Previous studies including the examination of 09/05/2022 FINDINGS: Transverse diameter of heart is in the upper limits of normal. There are no signs of pulmonary edema. There is slight improvement in aeration in right lower lung field with residual patchy infiltrate. There is new infiltrate in right upper lobe slightly above the minor fissure. Left lung is essentially clear.  There is no significant pleural effusion or pneumothorax. IMPRESSION: There is slight decrease in patchy infiltrate in right lower lung field suggesting decrease in atelectasis/pneumonia. New small patchy infiltrate is seen in right upper lobe suggesting atelectasis/pneumonia. Electronically Signed   By: Elmer Picker M.D.   On: 09/08/2022 10:54   CT SOFT TISSUE NECK WO CONTRAST  Result Date: 09/07/2022 CLINICAL DATA:  Concern for tracheal injury after suctioning. Remote history of laryngectomy. EXAM: CT NECK WITHOUT CONTRAST TECHNIQUE: Multidetector CT imaging of the neck was performed following the standard protocol without intravenous contrast. RADIATION DOSE REDUCTION: This exam was performed according to the departmental dose-optimization program which includes automated exposure control, adjustment of the mA and/or kV according to patient size and/or use of iterative reconstruction technique. COMPARISON:  CT neck 08/08/2012, MR neck 08/17/2012 FINDINGS: Pharynx and larynx: The nasal cavity and nasopharynx are unremarkable. The oral cavity and oropharynx are unremarkable. The patient is status post total laryngectomy. A TEP device is in place which appears grossly in appropriate position. There is no definite abnormal soft tissue mass to suggest local recurrence, within the confines of noncontrast technique. The upper trachea is only partially imaged but appears within normal limits. There is no retropharyngeal fluid collection. Salivary glands: The parotid glands are unremarkable. The submandibular glands are not identified, likely surgically absent. Thyroid: A small amount of thyroid tissue is  identified, unremarkable. Lymph nodes: There is no pathologic lymphadenopathy in the neck. Vascular: There is bulky calcified plaque at the bilateral carotid bifurcations. Limited intracranial: The imaged portions of the intracranial compartment are unremarkable. Visualized orbits: The globes and orbits are  unremarkable. Mastoids and visualized paranasal sinuses: Clear. Skeleton: There is degenerative change of the cervical spine most advanced at C5-C6 and C6-C7. There is no acute osseous abnormality or suspicious osseous lesion. Upper chest: There is prominent scarring in the lung apices bilaterally. Other: None. IMPRESSION: 1. No specific evidence of tracheal injury or other acute finding identified in the neck. 2. Postsurgical changes reflecting total laryngectomy without definite evidence of local recurrence, within the confines of noncontrast technique. 3. No pathologic lymphadenopathy in the neck. Electronically Signed   By: Lesia Hausen M.D.   On: 09/07/2022 15:58   US RENAL  Result Date: 09/06/2022 CLINICAL DATA:  Renal failure EXAM: RENAL / URINARY TRACT ULTRASOUND COMPLETE COMPARISON:  CT 07/04/2021 FINDINGS: Right Kidney: Renal measurements: 10.2 x 4.1 x 4.4 cm = volume: 97 mL. Increased cortical echogenicity. There are benign-appearing cystic masses measuring 3.6 and 1.6 cm respectively. No follow-up is required. Left Kidney: Renal measurements: 10.9 x 5.2 x 4.3 cm = volume: 127 mL. Increased cortical echogenicity. There are benign-appearing cystic masses measuring 3.3 and 3.0 cm, respectively. No follow-up is required. Bladder: Appears normal for degree of bladder distention. Other: None. IMPRESSION: Increased echogenicity of the bilateral kidneys compatible with medical renal disease. Electronically Signed   By: Minerva Fester M.D.   On: 09/06/2022 23:15    Labs: BMET Recent Labs  Lab 09/02/22 1827 09/03/22 0154 09/04/22 0459 09/06/22 0508 09/07/22 0440 09/08/22 0509  NA 142 142 139 139 138 137  K 4.9 4.4 4.3 4.4 3.7 3.2*  CL 115* 114* 109 108 106 98  CO2 19* 18* 19* 18* 21* 27  GLUCOSE 100* 109* 101* 103* 121* 142*  BUN 54* 59* 59* 65* 64* 54*  CREATININE 3.56* 3.68* 4.06* 4.96*  4.88* 5.09* 4.49*  CALCIUM 8.6* 8.2* 8.4* 8.5* 8.1* 7.6*  PHOS  --   --   --  7.6* 5.8*  --     CBC Recent Labs  Lab 09/02/22 1827 09/03/22 0154 09/04/22 0459 09/06/22 0508 09/07/22 0850 09/08/22 0509  WBC 9.6   < > 7.4 9.2 9.9 9.7  NEUTROABS 8.5*  --   --   --   --   --   HGB 8.2*   < > 7.7* 7.7* 7.1* 7.1*  HCT 27.0*   < > 24.4* 25.0* 22.2* 22.7*  MCV 89.4   < > 88.7 89.0 87.4 88.3  PLT 170   < > 196 242 255 249   < > = values in this interval not displayed.    Medications:     acidophilus  1 capsule Oral Daily   azithromycin  500 mg Oral Daily   carvedilol  25 mg Oral BID WC   Chlorhexidine Gluconate Cloth  6 each Topical Daily   cilostazol  100 mg Oral BID   DULoxetine  20 mg Oral Daily   feeding supplement  237 mL Oral BID BM   fluticasone  2 spray Each Nare Daily   hydrALAZINE  100 mg Oral TID   isosorbide mononitrate  60 mg Oral Daily   levothyroxine  50 mcg Oral Q0600   loratadine  10 mg Oral Daily   LORazepam  1 mg Intravenous Once   mirtazapine  15 mg Oral QHS  mouth rinse  15 mL Mouth Rinse 4 times per day   pantoprazole  40 mg Oral Daily   potassium chloride  20 mEq Oral Once   rosuvastatin  20 mg Oral QHS   sodium chloride flush  3 mL Intravenous Q12H    Madelon Lips MD 09/08/2022, 1:38 PM

## 2022-09-08 NOTE — Progress Notes (Signed)
RN called RT to room due to pt sats 74% on 28% at 5lpm trach collar, pt suctioned x 3 bloody thick secretions obtained, med thick red clot obtained as well. Pt titrated to 35% at 8lpm atc due to sats 90% on 28%.

## 2022-09-08 NOTE — Progress Notes (Signed)
   09/08/22 0117  Vitals  BP (!) 195/59  MAP (mmHg) 95  BP Location Left Arm  BP Method Automatic  Patient Position (if appropriate) Lying  Pulse Rate 84  Pulse Rate Source Monitor  MEWS COLOR  MEWS Score Color Green  Oxygen Therapy  SpO2 95 %  O2 Device Tracheostomy Collar    Provider updated on BP. Hydralazine given per PRN order.

## 2022-09-08 NOTE — Progress Notes (Signed)
Pt complaining of nausea, RT did not attempt to suction pt at this time, RN at bedside and aware

## 2022-09-08 NOTE — Progress Notes (Signed)
PT Cancellation Note  Patient Details Name: TRINISHA PAGET MRN: [id number] DOB: 06-01-1946   Cancelled Treatment:    Reason Eval/Treat Not Completed: Medical issues which prohibited therapy (pt not medically ready for PT per RN, noted BP 241/85 this morning and transfer to ICU. Will follow.)   Philomena Doheny PT 09/08/2022  Acute Rehabilitation Services  Office [phone]

## 2022-09-08 NOTE — Progress Notes (Signed)
   09/08/22 0008  Vitals  Temp 98.3 F (36.8 C)  Temp Source Oral  BP (!) 222/76  BP Location Left Arm  BP Method Automatic  Patient Position (if appropriate) Lying  Pulse Rate 90  Resp 20  MEWS COLOR  MEWS Score Color Yellow  Oxygen Therapy  SpO2 99 %     Pt hypertensive approx 20 min after second vomiting episode. Provider contacted. PRN labetalol given per order.

## 2022-09-08 NOTE — Progress Notes (Signed)
PROGRESS NOTE    [name]  JXB:[id number] DOB: 1945/12/19 DOA: 09/02/2022 PCP: Hoyt Koch, MD    Brief Narrative:  [name] is a 76 y.o. female with medical history significant of atrial fibrillation, hypertension, anxiety, depression, hyperlipidemia, PAD, COPD, chronic pain, dysphagia, CKD 4, GERD, hypothyroidism, history of head neck cancer status post laryngectomy, anemia, CVA presenting with ongoing shortness of breath and weakness.  Does not wear O2 at home.  Stay complicated by worsening renal function and bleeding from stoma and continued issues with hypertension.    Assessment and Plan: Acute on chronic diastolic CHF Hypertensive emergency Demand ischemia >  significantly hypertensive on presentation to 130Q systolic did not respond to initial IV hydralazine in the ED and on second dose reduced to the 140s. > Presentation consistent with hypertensive emergency/CHF exacerbation.  BNP points for CHF and this is new.  Demand ischemia is a likely etiology for troponin which is intermittently elevated and is without chest pain.  -cards consult- they defer to nephrology to  manage outpatient her BP -9/26 patient developed chest tightness/headache/elevated BP-- tx to SDU and started on cleviprex drip after no response to IV pushes of labetalol, hydralazine -at home per daughter in law may not have been taking her meds consistently- family plans to take over her medications   Acute resp failure with hypoxia -does not wear O2 at home -wean to RA as able -pulm toilet -x ray with some improvement -? PNA-- pro calcitonin elevated -IV abx and monitor  Bleeding from stoma -CT scan done -PCCM consulted and eliquis held  A-fib - Continue home carvedilol and Eliquis (holding eliquis) -elquis to be held 3 days prior to procedure ONLY   Hyperlipidemia PAD - Continue home rosuvastatin, cilostazol   Worsening CKD 4 > Creatinine stable - Trend renal function  and electrolytes - will ask nephrology to see (has seen Dr. Candiss Norse in the past) -IVF held for chest tightness   Anemia-unclear etiology -outpatient follow up- prob CKD > Hemoglobin trending down -transfuse for <7   tremor -outpatient referral to neurology -will also need testing for dementia  Hypothyroidism - Continue home Synthroid   Hypomagnesemia -replete  Anxiety Depression - Continue home fluoxetine, Remeron, Ativan -daughter in law states she had been taking more ativan than prescribed   COPD > History of this however do not see any inhalers in chart.   History of head/neck cancer  > Status post laryngectomy with trach in place. - Noted -due to have stoma stretched in October-- I reached out to [facility] and she needs to hold her eliquis 3 days prior to procedure   PT Eval- patient refused- says she will participate if not done after suctioning  Palliative care consult for [place]    DVT prophylaxis: Place and maintain sequential compression device Start: 09/07/22 1154    Code Status: Full Code Family Communication: daughter in law - called Josilynn Losh  Disposition Plan:  Level of care: [place] when stable    Consultants:  Cards Pulm Palliative care   Subjective: C/o headache/chest tightness "Feels like she is going to die"  Objective: Vitals:   09/08/22 0846 09/08/22 0851 09/08/22 1007 09/08/22 1042  BP:   (!) 232/80 (!) 241/85  Pulse:  100    Resp:  18    Temp:      TempSrc:      SpO2: 92% 95%    Weight:      Height:  Intake/Output Summary (Last 24 hours) at 09/08/2022 1213 Last data filed at 09/07/2022 2357 Gross per 24 hour  Intake 100 ml  Output 302 ml  Net -202 ml   Filed Weights   09/04/22 0530 09/05/22 0604 09/06/22 0500  Weight: 53.5 kg 52.3 kg 50.4 kg    Examination:   General: Appearance:    Thin female who appears uncomfortable/anxious     Lungs:     respirations unlabored  Heart:    Tachycardic. Irregularly  irregular rhythm.   MS:   All extremities are intact.   Neurologic:   Awake, alert- tearful at times         Data Reviewed: I have personally reviewed following labs and imaging studies  CBC: Recent Labs  Lab 09/02/22 1827 09/03/22 0154 09/04/22 0459 09/06/22 0508 09/07/22 0850 09/08/22 0509  WBC 9.6 8.4 7.4 9.2 9.9 9.7  NEUTROABS 8.5*  --   --   --   --   --   HGB 8.2* 7.7* 7.7* 7.7* 7.1* 7.1*  HCT 27.0* 24.6* 24.4* 25.0* 22.2* 22.7*  MCV 89.4 90.4 88.7 89.0 87.4 88.3  PLT 170 172 196 242 255 793   Basic Metabolic Panel: Recent Labs  Lab 09/03/22 0154 09/04/22 0459 09/06/22 0508 09/07/22 0440 09/08/22 0509  NA 142 139 139 138 137  K 4.4 4.3 4.4 3.7 3.2*  CL 114* 109 108 106 98  CO2 18* 19* 18* 21* 27  GLUCOSE 109* 101* 103* 121* 142*  BUN 59* 59* 65* 64* 54*  CREATININE 3.68* 4.06* 4.96*  4.88* 5.09* 4.49*  CALCIUM 8.2* 8.4* 8.5* 8.1* 7.6*  MG 1.6* 2.3  --   --   --   PHOS  --   --  7.6* 5.8*  --    GFR: Estimated Creatinine Clearance: 8.5 mL/min (A) (by C-G formula based on SCr of 4.49 mg/dL (H)). Liver Function Tests: Recent Labs  Lab 09/02/22 1827 09/03/22 0154 09/06/22 0508 09/07/22 0440  AST 16 15  --   --   ALT 12 12  --   --   ALKPHOS 71 65  --   --   BILITOT 0.5 0.4  --   --   PROT 5.6* 5.3*  --   --   ALBUMIN 2.7* 2.4* 2.5* 2.3*   No results for input(s): "LIPASE", "AMYLASE" in the last 168 hours. No results for input(s): "AMMONIA" in the last 168 hours. Coagulation Profile: No results for input(s): "INR", "PROTIME" in the last 168 hours. Cardiac Enzymes: No results for input(s): "CKTOTAL", "CKMB", "CKMBINDEX", "TROPONINI" in the last 168 hours. BNP (last 3 results) No results for input(s): "PROBNP" in the last 8760 hours. HbA1C: No results for input(s): "HGBA1C" in the last 72 hours. CBG: No results for input(s): "GLUCAP" in the last 168 hours. Lipid Profile: No results for input(s): "CHOL", "HDL", "LDLCALC", "TRIG", "CHOLHDL",  "LDLDIRECT" in the last 72 hours. Thyroid Function Tests: No results for input(s): "TSH", "T4TOTAL", "FREET4", "T3FREE", "THYROIDAB" in the last 72 hours. Anemia Panel: No results for input(s): "VITAMINB12", "FOLATE", "FERRITIN", "TIBC", "IRON", "RETICCTPCT" in the last 72 hours. Sepsis Labs: Recent Labs  Lab 09/05/22 1216 09/06/22 0508 09/07/22 0440  PROCALCITON 6.40 5.31 3.37    No results found for this or any previous visit (from the past 240 hour(s)).       Radiology Studies: DG CHEST PORT 1 VIEW  Result Date: 09/08/2022 CLINICAL DATA:  Hypoxia, COPD EXAM: PORTABLE CHEST 1 VIEW COMPARISON:  Previous studies including the  examination of 09/05/2022 FINDINGS: Transverse diameter of heart is in the upper limits of normal. There are no signs of pulmonary edema. There is slight improvement in aeration in right lower lung field with residual patchy infiltrate. There is new infiltrate in right upper lobe slightly above the minor fissure. Left lung is essentially clear. There is no significant pleural effusion or pneumothorax. IMPRESSION: There is slight decrease in patchy infiltrate in right lower lung field suggesting decrease in atelectasis/pneumonia. New small patchy infiltrate is seen in right upper lobe suggesting atelectasis/pneumonia. Electronically Signed   By: Elmer Picker M.D.   On: 09/08/2022 10:54   CT SOFT TISSUE NECK WO CONTRAST  Result Date: 09/07/2022 CLINICAL DATA:  Concern for tracheal injury after suctioning. Remote history of laryngectomy. EXAM: CT NECK WITHOUT CONTRAST TECHNIQUE: Multidetector CT imaging of the neck was performed following the standard protocol without intravenous contrast. RADIATION DOSE REDUCTION: This exam was performed according to the departmental dose-optimization program which includes automated exposure control, adjustment of the mA and/or kV according to patient size and/or use of iterative reconstruction technique. COMPARISON:  CT neck  08/08/2012, MR neck 08/17/2012 FINDINGS: Pharynx and larynx: The nasal cavity and nasopharynx are unremarkable. The oral cavity and oropharynx are unremarkable. The patient is status post total laryngectomy. A TEP device is in place which appears grossly in appropriate position. There is no definite abnormal soft tissue mass to suggest local recurrence, within the confines of noncontrast technique. The upper trachea is only partially imaged but appears within normal limits. There is no retropharyngeal fluid collection. Salivary glands: The parotid glands are unremarkable. The submandibular glands are not identified, likely surgically absent. Thyroid: A small amount of thyroid tissue is identified, unremarkable. Lymph nodes: There is no pathologic lymphadenopathy in the neck. Vascular: There is bulky calcified plaque at the bilateral carotid bifurcations. Limited intracranial: The imaged portions of the intracranial compartment are unremarkable. Visualized orbits: The globes and orbits are unremarkable. Mastoids and visualized paranasal sinuses: Clear. Skeleton: There is degenerative change of the cervical spine most advanced at C5-C6 and C6-C7. There is no acute osseous abnormality or suspicious osseous lesion. Upper chest: There is prominent scarring in the lung apices bilaterally. Other: None. IMPRESSION: 1. No specific evidence of tracheal injury or other acute finding identified in the neck. 2. Postsurgical changes reflecting total laryngectomy without definite evidence of local recurrence, within the confines of noncontrast technique. 3. No pathologic lymphadenopathy in the neck. Electronically Signed   By: Valetta Mole M.D.   On: 09/07/2022 15:58   US RENAL  Result Date: 09/06/2022 CLINICAL DATA:  Renal failure EXAM: RENAL / URINARY TRACT ULTRASOUND COMPLETE COMPARISON:  CT 07/04/2021 FINDINGS: Right Kidney: Renal measurements: 10.2 x 4.1 x 4.4 cm = volume: 97 mL. Increased cortical echogenicity. There are  benign-appearing cystic masses measuring 3.6 and 1.6 cm respectively. No follow-up is required. Left Kidney: Renal measurements: 10.9 x 5.2 x 4.3 cm = volume: 127 mL. Increased cortical echogenicity. There are benign-appearing cystic masses measuring 3.3 and 3.0 cm, respectively. No follow-up is required. Bladder: Appears normal for degree of bladder distention. Other: None. IMPRESSION: Increased echogenicity of the bilateral kidneys compatible with medical renal disease. Electronically Signed   By: Placido Sou M.D.   On: 09/06/2022 23:15        Scheduled Meds:  acidophilus  1 capsule Oral Daily   azithromycin  500 mg Oral Daily   carvedilol  25 mg Oral BID WC   Chlorhexidine Gluconate Cloth  6 each  Topical Daily   cilostazol  100 mg Oral BID   DULoxetine  20 mg Oral Daily   feeding supplement  237 mL Oral BID BM   fluticasone  2 spray Each Nare Daily   hydrALAZINE  100 mg Oral TID   isosorbide mononitrate  60 mg Oral Daily   labetalol  10 mg Intravenous Once   levothyroxine  50 mcg Oral Q0600   loratadine  10 mg Oral Daily   mirtazapine  15 mg Oral QHS   mouth rinse  15 mL Mouth Rinse 4 times per day   pantoprazole  40 mg Oral Daily   potassium chloride  20 mEq Oral Once   rosuvastatin  20 mg Oral QHS   sodium chloride flush  3 mL Intravenous Q12H   tranexamic acid  500 mg Nebulization Once   Continuous Infusions:  cefTRIAXone (ROCEPHIN)  IV 2 g (09/07/22 1712)   clevidipine 2 mg/hr (09/08/22 1211)     LOS: 5 days    Time spent: 45 minutes spent on chart review, discussion with nursing staff, consultants, updating family and interview/physical exam; more than 50% of that time was spent in counseling and/or coordination of care.    Geradine Girt, DO [facility] Available via Epic secure chat 7am-7pm After these hours, please refer to coverage provider listed on [email or web address] 09/08/2022, 12:13 PM

## 2022-09-08 NOTE — Progress Notes (Signed)
NAME:  [name], MRN:  [id number], DOB:  [birth year], LOS: 5 ADMISSION DATE:  09/02/2022, CONSULTATION DATE:  9/25 REFERRING MD:  Eliseo Squires, CHIEF COMPLAINT: Hemoptysis  History of Present Illness:  76 year old female with a complicated past medical history admitted on September 02, 2022 in the setting of acute respiratory failure with hypoxemia secondary to a CHF exacerbation.  Noted to have worsening renal insufficiency and evidence of demand ischemia.  Nephrology and cardiology were consulted. Pulmonary and critical care medicine was consulted on September 07, 2022 in the setting of hemoptysis from her tracheal stoma.  The patient has a remote history of laryngectomy for head neck cancer.  During this admission she is required some suctioning from her tracheal stoma.  This morning she was noted to be coughing up bright red blood.  She denies respiratory distress.  However, she says that she does have some trouble swallowing and she feels some tenderness in her neck.  She also notes some mild stomach pain.  She denies chest pain.  Of note, the patient had an esophageal dilation as well as tracheal stoma dilation by ENT at [facility] on July 16, 2022.  Pertinent  Medical History  Atrial fibrillation Hypertension Anxiety Depression Hyperlipidemia PAD COPD Chronic pain Dysphagia CKD stage IV GERD Hypothyroidism History of head and neck cancer status post laryngectomy Anemia History of stroke  Significant Hospital Events: Including procedures, antibiotic start and stop dates in addition to other pertinent events   September 02, 2022 admitted for CHF exacerbation September 07, 2022 hemoptysis after tracheal suctioning through stoma 9/26 nausea causing anti-htn to be held. SBP 200s. SOB, chest pain. Tx to ICU.   Interim History / Subjective:  Daughter bedside to assist with Spanish translation  PCCM called early to evaluate patient at bedside. Feels as though she isn't  getting enough air this morning. FiO2 increased to 8L via trach collar. Still having some subjective distress. Very upset. Tearful. Hypertensive since this morning per RN. Complaining of sternal chest pain, which is not new, but pain is now radiating to back. Nausea this morning preventing her antihypertensives from being administered.   Objective   Blood pressure (!) 241/85, pulse 100, temperature 98.1 F (36.7 C), temperature source Oral, resp. rate 18, height '5\' 3"'$  (1.6 m), weight 50.4 kg, SpO2 95 %.    FiO2 (%):  [28 %-35 %] 35 %   Intake/Output Summary (Last 24 hours) at 09/08/2022 1143 Last data filed at 09/07/2022 2357 Gross per 24 hour  Intake 150 ml  Output 302 ml  Net -152 ml    Filed Weights   09/04/22 0530 09/05/22 0604 09/06/22 0500  Weight: 53.5 kg 52.3 kg 50.4 kg    Examination: General:  sitting upright in bed. Mild respiratory distress. Tearful.  HENT: [place]/AT. Stoma circumference with dried blood. No visible obstructing clot. Cough is non-productive. No frank blood observed.   PULM: Clear bilateral breath sounds. No accessory muscle use. No stridor.  CV: RRR, no MRG GI: Soft, non-tender, non-disnteded MSK: No acute deformity.  Neuro: Alert, oriented, non-focal.    Resolved Hospital Problem list     Assessment & Plan:  Hemoptysis: likely due to suction trauma. CT scan negative for any tracheal injury. Head and neck cancer, status post remote laryngectomy. History of esophageal stricture. Atrial fibrillation on Eliquis. CHF, acute exacerbation of diastolic heart failure  Discussion: Dyspnea is likely multifactorial this morning. Patient is quite hypertensive so I am concerned about pulmonary edema. Anxiety seems to  be playing a key factor as well. No frank hemoptysis observed. Family is concerned dried blood around her stoma is obstructing her airway. There is no evidence of this externally. CXR reviewed without any clear indication for acute etiology.  -  Transfer to ICU - Titrate FiO2 for SpO2 92 - 98% - Hold Eliquis - Defer suctioning trachea to only when absolutely necessary - Patient advised to minimize coughing as able - Continue rest of care as per routine - N.p.o. for now  Hypertensive crisis - SBP goal < 160 mmHg. - Labetalol '10mg'$  now. - Clevidipine recommended by nephrology. Will transfer to ICU/SDU where we can titrate as indicated. - Resume home medications once nausea improved. PRN Zofran.  Demand ischemia Chest pain - Repeat EKG, troponin.   Best Practice (right click and "Reselect all SmartList Selections" daily)   Per [facility]   Critical care time: 42 minutes     Georgann Housekeeper, AGACNP-BC [facility]  See Amion for personal pager PCCM on call pager [phone] until 7pm. Please call Elink 7p-7a. [phone]  09/08/2022 12:06 PM

## 2022-09-09 DIAGNOSIS — I5033 Acute on chronic diastolic (congestive) heart failure: Secondary | ICD-10-CM | POA: Diagnosis not present

## 2022-09-09 LAB — BASIC METABOLIC PANEL
Anion gap: 14 (ref 5–15)
BUN: 46 mg/dL — ABNORMAL HIGH (ref 8–23)
CO2: 30 mmol/L (ref 22–32)
Calcium: 8.1 mg/dL — ABNORMAL LOW (ref 8.9–10.3)
Chloride: 98 mmol/L (ref 98–111)
Creatinine, Ser: 4.09 mg/dL — ABNORMAL HIGH (ref 0.44–1.00)
GFR, Estimated: 11 mL/min — ABNORMAL LOW (ref >60)
Glucose, Bld: 101 mg/dL — ABNORMAL HIGH (ref 70–99)
Potassium: 3 mmol/L — ABNORMAL LOW (ref 3.5–5.1)
Sodium: 142 mmol/L (ref 135–145)

## 2022-09-09 LAB — CBC
HCT: 24.2 % — ABNORMAL LOW (ref 36.0–46.0)
Hemoglobin: 7.7 g/dL — ABNORMAL LOW (ref 12.0–15.0)
MCH: 27.8 pg (ref 26.0–34.0)
MCHC: 31.8 g/dL (ref 30.0–36.0)
MCV: 87.4 fL (ref 80.0–100.0)
Platelets: 307 10*3/uL (ref 150–400)
RBC: 2.77 MIL/uL — ABNORMAL LOW (ref 3.87–5.11)
RDW: 15.9 % — ABNORMAL HIGH (ref 11.5–15.5)
WBC: 8.3 10*3/uL (ref 4.0–10.5)
nRBC: 0 % (ref 0.0–0.2)

## 2022-09-09 LAB — TRIGLYCERIDES: Triglycerides: 121 mg/dL (ref <150)

## 2022-09-09 MED ORDER — ACETAMINOPHEN 650 MG RE SUPP: 650.0000 mg | Freq: Four times a day (QID) | RECTAL | Status: DC | PRN

## 2022-09-09 MED ORDER — LOPERAMIDE HCL 1 MG/7.5ML PO SUSP: 2.0000 mg | ORAL | Status: DC | PRN

## 2022-09-09 MED ORDER — DIAZEPAM 1 MG/ML PO SOLN: 2.5000 mg | Freq: Two times a day (BID) | ORAL | Status: DC

## 2022-09-09 MED ORDER — CLEVIDIPINE BUTYRATE 0.5 MG/ML IV EMUL
0.0000 mg/h | INTRAVENOUS | Status: DC
  Administered 2022-09-09: 11 mg/h via INTRAVENOUS
  Administered 2022-09-09: 7 mg/h via INTRAVENOUS
  Administered 2022-09-09: 12 mg/h via INTRAVENOUS
  Administered 2022-09-09: 10 mg/h via INTRAVENOUS
  Administered 2022-09-10: 7 mg/h via INTRAVENOUS
  Administered 2022-09-11: 1 mg/h via INTRAVENOUS
  Filled 2022-09-09 (×10): qty 100

## 2022-09-09 MED ORDER — ACETAMINOPHEN 160 MG/5ML PO SOLN
650.0000 mg | Freq: Four times a day (QID) | ORAL | Status: DC | PRN
  Administered 2022-09-13 – 2022-09-16 (×2): 650 mg via ORAL
  Filled 2022-09-09 (×2): qty 20.3

## 2022-09-09 MED ORDER — DIAZEPAM 5 MG PO TABS
2.5000 mg | ORAL_TABLET | Freq: Two times a day (BID) | ORAL | Status: DC
  Administered 2022-09-09 – 2022-09-10 (×3): 2.5 mg via ORAL
  Filled 2022-09-09 (×4): qty 1

## 2022-09-09 MED ORDER — APIXABAN 2.5 MG PO TABS
2.5000 mg | ORAL_TABLET | Freq: Two times a day (BID) | ORAL | Status: DC
  Administered 2022-09-09 – 2022-09-17 (×16): 2.5 mg via ORAL
  Filled 2022-09-09 (×17): qty 1

## 2022-09-09 MED ORDER — MIRTAZAPINE 15 MG PO TBDP
15.0000 mg | ORAL_TABLET | Freq: Every day | ORAL | Status: DC
  Administered 2022-09-09 – 2022-09-16 (×7): 15 mg via ORAL
  Filled 2022-09-09 (×10): qty 1

## 2022-09-09 MED ORDER — PANTOPRAZOLE 2 MG/ML SUSPENSION
40.0000 mg | Freq: Every day | ORAL | Status: DC
  Filled 2022-09-09: qty 20

## 2022-09-09 NOTE — Progress Notes (Signed)
[facility] Progress Note   Assessment/ Plan:   Assessment/ Plan: AKI on CKD 5 - b/l creat is 3.2- 3.6 from august 2023, eGFR 12-14 ml/min. Creat here was 3.5 on admission, which is in normal range for her. With diuresis for suspected CHF the CXR changes have improved.  Cr appears to have peaked and plateaued - renal US negative for obstruction - off IVFs, Cr improved HTNsive urgency : initally were high, then got better.  Started on gtt with hypertensive urgency.  Coming down off gtt with taking pills- hopefully we can wean off.  Palliative care has recommended valium which I hope helps her anxiety and may help her BP too. Atrial fib COPD Hx head/ neck cancer / sp laryngectomy w/ stoma Hemoptysis: pulmonary consulted, plan for CT of the neck-- no tracheal injury.  Appears to be laceration on stoma and not coming from lungs.  Subjective:    Improving on cleviprex gtt.  Able to take pills.  BP better, coming down off gtt.  Smiling and appears much less anxious today.   Objective:   BP (!) 140/53 (BP Location: Left Arm)   Pulse 76   Temp 98 F (36.7 C) (Oral)   Resp 15   Ht _0  (1.6 m)   Wt 49.7 kg   SpO2 91%   BMI 19.41 kg/m   Intake/Output Summary (Last 24 hours) at 09/09/2022 1355 Last data filed at 09/09/2022 1302 Gross per 24 hour  Intake 723.77 ml  Output 650 ml  Net 73.77 ml   Weight change:   Physical Exam: Gen: appears much better today NECK: + stoma good CVS:RRR Resp: clear ZOX:WRUE  Ext: no LE edema  Imaging: DG CHEST PORT 1 VIEW  Result Date: 09/08/2022 CLINICAL DATA:  Hypoxia, COPD EXAM: PORTABLE CHEST 1 VIEW COMPARISON:  Previous studies including the examination of 09/05/2022 FINDINGS: Transverse diameter of heart is in the upper limits of normal. There are no signs of pulmonary edema. There is slight improvement in aeration in right lower lung field with residual patchy infiltrate. There is new infiltrate in right upper lobe slightly  above the minor fissure. Left lung is essentially clear. There is no significant pleural effusion or pneumothorax. IMPRESSION: There is slight decrease in patchy infiltrate in right lower lung field suggesting decrease in atelectasis/pneumonia. New small patchy infiltrate is seen in right upper lobe suggesting atelectasis/pneumonia. Electronically Signed   By: Elmer Picker M.D.   On: 09/08/2022 10:54   CT SOFT TISSUE NECK WO CONTRAST  Result Date: 09/07/2022 CLINICAL DATA:  Concern for tracheal injury after suctioning. Remote history of laryngectomy. EXAM: CT NECK WITHOUT CONTRAST TECHNIQUE: Multidetector CT imaging of the neck was performed following the standard protocol without intravenous contrast. RADIATION DOSE REDUCTION: This exam was performed according to the departmental dose-optimization program which includes automated exposure control, adjustment of the mA and/or kV according to patient size and/or use of iterative reconstruction technique. COMPARISON:  CT neck 08/08/2012, MR neck 08/17/2012 FINDINGS: Pharynx and larynx: The nasal cavity and nasopharynx are unremarkable. The oral cavity and oropharynx are unremarkable. The patient is status post total laryngectomy. A TEP device is in place which appears grossly in appropriate position. There is no definite abnormal soft tissue mass to suggest local recurrence, within the confines of noncontrast technique. The upper trachea is only partially imaged but appears within normal limits. There is no retropharyngeal fluid collection. Salivary glands: The parotid glands are unremarkable. The submandibular glands are not identified, likely surgically  absent. Thyroid: A small amount of thyroid tissue is identified, unremarkable. Lymph nodes: There is no pathologic lymphadenopathy in the neck. Vascular: There is bulky calcified plaque at the bilateral carotid bifurcations. Limited intracranial: The imaged portions of the intracranial compartment are  unremarkable. Visualized orbits: The globes and orbits are unremarkable. Mastoids and visualized paranasal sinuses: Clear. Skeleton: There is degenerative change of the cervical spine most advanced at C5-C6 and C6-C7. There is no acute osseous abnormality or suspicious osseous lesion. Upper chest: There is prominent scarring in the lung apices bilaterally. Other: None. IMPRESSION: 1. No specific evidence of tracheal injury or other acute finding identified in the neck. 2. Postsurgical changes reflecting total laryngectomy without definite evidence of local recurrence, within the confines of noncontrast technique. 3. No pathologic lymphadenopathy in the neck. Electronically Signed   By: Valetta Mole M.D.   On: 09/07/2022 15:58    Labs: BMET Recent Labs  Lab 09/02/22 1827 09/03/22 0154 09/04/22 0459 09/06/22 0508 09/07/22 0440 09/08/22 0509 09/09/22 0317  NA 142 142 139 139 138 137 142  K 4.9 4.4 4.3 4.4 3.7 3.2* 3.0*  CL 115* 114* 109 108 106 98 98  CO2 19* 18* 19* 18* 21* 27 30  GLUCOSE 100* 109* 101* 103* 121* 142* 101*  BUN 54* 59* 59* 65* 64* 54* 46*  CREATININE 3.56* 3.68* 4.06* 4.96*  4.88* 5.09* 4.49* 4.09*  CALCIUM 8.6* 8.2* 8.4* 8.5* 8.1* 7.6* 8.1*  PHOS  --   --   --  7.6* 5.8*  --   --    CBC Recent Labs  Lab 09/02/22 1827 09/03/22 0154 09/06/22 0508 09/07/22 0850 09/08/22 0509 09/09/22 0317  WBC 9.6   < > 9.2 9.9 9.7 8.3  NEUTROABS 8.5*  --   --   --   --   --   HGB 8.2*   < > 7.7* 7.1* 7.1* 7.7*  HCT 27.0*   < > 25.0* 22.2* 22.7* 24.2*  MCV 89.4   < > 89.0 87.4 88.3 87.4  PLT 170   < > 242 255 249 307   < > = values in this interval not displayed.    Medications:     acidophilus  1 capsule Oral Daily   apixaban  2.5 mg Oral BID   azithromycin  500 mg Oral Daily   carvedilol  25 mg Oral BID WC   Chlorhexidine Gluconate Cloth  6 each Topical Daily   cilostazol  100 mg Oral BID   diazepam  2.5 mg Oral Q12H   DULoxetine  20 mg Oral Daily   feeding  supplement  237 mL Oral BID BM   fluticasone  2 spray Each Nare Daily   hydrALAZINE  100 mg Oral TID   isosorbide mononitrate  60 mg Oral Daily   levothyroxine  50 mcg Oral Q0600   loratadine  10 mg Oral Daily   mirtazapine  15 mg Oral QHS   mupirocin ointment  1 Application Nasal BID   mouth rinse  15 mL Mouth Rinse 4 times per day   [START ON 09/10/2022] pantoprazole  40 mg Per Tube Daily   potassium chloride  20 mEq Oral Once   rosuvastatin  20 mg Oral QHS   sodium chloride flush  3 mL Intravenous Q12H    Madelon Lips MD 09/09/2022, 1:55 PM

## 2022-09-09 NOTE — Progress Notes (Signed)
PROGRESS NOTE    [name]  OVF:[id number] DOB: Jun 06, 1946 DOA: 09/02/2022 PCP: Hoyt Koch, MD    Brief Narrative:  [name] is a 76 y.o. female with medical history significant of atrial fibrillation, hypertension, anxiety, depression, hyperlipidemia, PAD, COPD, chronic pain, dysphagia, CKD 4, GERD, hypothyroidism, history of head neck cancer status post laryngectomy, anemia, CVA presenting with ongoing shortness of breath and weakness.  Does not wear O2 at home.  Patient treated with diuresis with worsening renal function started to stabilize now. Blood pressure control was major issue.  She was transferred to stepdown unit and treated with Cleviprex. Also started having bleeding through the stoma on her neck, treated conservatively.    Assessment & Plan:   Acute on chronic diastolic congestive heart failure Essential hypertension with hypertensive emergency Demand ischemia  Patient was on the medical floor, blood pressure more than 230, did not respond to IV hydralazine.  Most of the time she was nauseated and unable to take oral medications. 9/26, chest tightness and headache with elevated blood pressures.  Transferred to SDU and currently on Cleviprex.  Nausea improved. Resume home medications including carvedilol, hydralazine, Imdur today and wean off Cleviprex.  Continue as needed labetalol and hydralazine.  Will uptitrate medication for home. Seen by cardiology, conservative management advised. Minimally elevated troponins, no work-up indicated.  Acute respiratory failure with hypoxemia: Does not wear oxygen at home.  She was on 5 L of oxygen through her tracheostomy stoma.  Clinically stabilizing.  She did have evidence of shortness of breath.  Patient was presumptively treated for pneumonia with IV antibiotics, will complete 5 days of therapy.  Bleeding through stoma: Mechanical injury with coagulopathy from Eliquis.  Stabilized.  Resume Eliquis  today.  Paroxysmal A-fib: Currently sinus rhythm.  Continue carvedilol, resume Eliquis today.  Peripheral arterial disease on rosuvastatin and cilostazol.  Acute kidney injury on CKD stage IV: Treated conservatively.  Is stabilizing.  Creatinine 4.09 today which is at about baseline.  Nephrology following.  Hypothyroidism: On Synthroid.  Anxiety/depression COPD Head and neck cancer status post laryngectomy Esophageal stricture  Chronic medical issues.  Stable.  Patient with improved nausea.  Advance diet.  Work with PT OT.  Start oral blood pressure medications and transition of Cleviprex.  Anticipate home tomorrow.  DVT prophylaxis: apixaban (ELIQUIS) tablet 2.5 mg Start: 09/09/22 1030 Place and maintain sequential compression device Start: 09/07/22 1154 apixaban (ELIQUIS) tablet 2.5 mg   Code Status: DNR Family Communication: Son and daughter-in-law at the bedside Disposition Plan: Status is: Inpatient Remains inpatient appropriate because: On Cleviprex infusion     Consultants:  Critical care Cardiology Nephrology  Procedures:  None  Antimicrobials:  Rocephin azithromycin 9/23---   Subjective: Patient seen and examined in the morning rounds.  Her son and daughter-in-law at the bedside.  Patient complained of being very hungry.  Denied any nausea or vomiting.  She is more excited today.  She is asking about going home. Family had multiple questions about her clinical status, why she is sicker all the time.  We discussed about her advanced kidney disease and difficulty balancing fluid. Patient eager to go home.  Still has occasional headache but much improved than before.  Objective: Vitals:   09/09/22 1109 09/09/22 1130 09/09/22 1200 09/09/22 1215  BP:  (!) 151/48  (!) 140/53  Pulse: 80 76 75 76  Resp: '18 20 16 15  '$ Temp:      TempSrc:      SpO2: 94% 94%  93% 91%  Weight:      Height:        Intake/Output Summary (Last 24 hours) at 09/09/2022 1242 Last data  filed at 09/09/2022 1231 Gross per 24 hour  Intake 523.77 ml  Output 650 ml  Net -126.23 ml   Filed Weights   09/05/22 0604 09/06/22 0500 09/09/22 0500  Weight: 52.3 kg 50.4 kg 49.7 kg    Examination:  General exam: Appears calm and comfortable  Thin and cachectic.  Frail and debilitated.  Not in any distress. Respiratory system: Clear to auscultation. Respiratory effort normal.  No added sounds. Tracheostomy stoma on her neck with some clotted dry scabs but no bleeding. Cardiovascular system: S1 & S2 heard, irregularly irregular. Gastrointestinal system: Abdomen is nondistended, soft and nontender. No organomegaly or masses felt. Normal bowel sounds heard. Central nervous system: Alert and oriented. No focal neurological deficits. Extremities: Symmetric 5 x 5 power.    Data Reviewed: I have personally reviewed following labs and imaging studies  CBC: Recent Labs  Lab 09/02/22 1827 09/03/22 0154 09/04/22 0459 09/06/22 0508 09/07/22 0850 09/08/22 0509 09/09/22 0317  WBC 9.6   < > 7.4 9.2 9.9 9.7 8.3  NEUTROABS 8.5*  --   --   --   --   --   --   HGB 8.2*   < > 7.7* 7.7* 7.1* 7.1* 7.7*  HCT 27.0*   < > 24.4* 25.0* 22.2* 22.7* 24.2*  MCV 89.4   < > 88.7 89.0 87.4 88.3 87.4  PLT 170   < > 196 242 255 249 307   < > = values in this interval not displayed.   Basic Metabolic Panel: Recent Labs  Lab 09/03/22 0154 09/04/22 0459 09/06/22 0508 09/07/22 0440 09/08/22 0509 09/09/22 0317  NA 142 139 139 138 137 142  K 4.4 4.3 4.4 3.7 3.2* 3.0*  CL 114* 109 108 106 98 98  CO2 18* 19* 18* 21* 27 30  GLUCOSE 109* 101* 103* 121* 142* 101*  BUN 59* 59* 65* 64* 54* 46*  CREATININE 3.68* 4.06* 4.96*  4.88* 5.09* 4.49* 4.09*  CALCIUM 8.2* 8.4* 8.5* 8.1* 7.6* 8.1*  MG 1.6* 2.3  --   --   --   --   PHOS  --   --  7.6* 5.8*  --   --    GFR: Estimated Creatinine Clearance: 9.2 mL/min (A) (by C-G formula based on SCr of 4.09 mg/dL (H)). Liver Function Tests: Recent Labs  Lab  09/02/22 1827 09/03/22 0154 09/06/22 0508 09/07/22 0440  AST 16 15  --   --   ALT 12 12  --   --   ALKPHOS 71 65  --   --   BILITOT 0.5 0.4  --   --   PROT 5.6* 5.3*  --   --   ALBUMIN 2.7* 2.4* 2.5* 2.3*   No results for input(s): "LIPASE", "AMYLASE" in the last 168 hours. No results for input(s): "AMMONIA" in the last 168 hours. Coagulation Profile: No results for input(s): "INR", "PROTIME" in the last 168 hours. Cardiac Enzymes: No results for input(s): "CKTOTAL", "CKMB", "CKMBINDEX", "TROPONINI" in the last 168 hours. BNP (last 3 results) No results for input(s): "PROBNP" in the last 8760 hours. HbA1C: No results for input(s): "HGBA1C" in the last 72 hours. CBG: No results for input(s): "GLUCAP" in the last 168 hours. Lipid Profile: Recent Labs    09/09/22 0317  TRIG 121   Thyroid Function Tests: No results  for input(s): "TSH", "T4TOTAL", "FREET4", "T3FREE", "THYROIDAB" in the last 72 hours. Anemia Panel: No results for input(s): "VITAMINB12", "FOLATE", "FERRITIN", "TIBC", "IRON", "RETICCTPCT" in the last 72 hours. Sepsis Labs: Recent Labs  Lab 09/05/22 1216 09/06/22 0508 09/07/22 0440  PROCALCITON 6.40 5.31 3.37    Recent Results (from the past 240 hour(s))  MRSA Next Gen by PCR, Nasal     Status: Abnormal   Collection Time: 09/08/22 12:52 PM   Specimen: Nasal Mucosa; Nasal Swab  Result Value Ref Range Status   MRSA by PCR Next Gen DETECTED (A) NOT DETECTED Final    Comment: CRITICAL RESULT CALLED TO, READ BACK BY AND VERIFIED WITH: Ambrose Finland RN @ 1956 09/08/2022 PER ENGLAD, K (NOTE) The GeneXpert MRSA Assay (FDA approved for NASAL specimens only), is one component of a comprehensive MRSA colonization surveillance program. It is not intended to diagnose MRSA infection nor to guide or monitor treatment for MRSA infections. Test performance is not FDA approved in patients less than 59 years old. Performed at [facility], [place]  [address] [place], [place] [address]          Radiology Studies: DG CHEST PORT 1 VIEW  Result Date: 09/08/2022 CLINICAL DATA:  Hypoxia, COPD EXAM: PORTABLE CHEST 1 VIEW COMPARISON:  Previous studies including the examination of 09/05/2022 FINDINGS: Transverse diameter of heart is in the upper limits of normal. There are no signs of pulmonary edema. There is slight improvement in aeration in right lower lung field with residual patchy infiltrate. There is new infiltrate in right upper lobe slightly above the minor fissure. Left lung is essentially clear. There is no significant pleural effusion or pneumothorax. IMPRESSION: There is slight decrease in patchy infiltrate in right lower lung field suggesting decrease in atelectasis/pneumonia. New small patchy infiltrate is seen in right upper lobe suggesting atelectasis/pneumonia. Electronically Signed   By: Elmer Picker M.D.   On: 09/08/2022 10:54   CT SOFT TISSUE NECK WO CONTRAST  Result Date: 09/07/2022 CLINICAL DATA:  Concern for tracheal injury after suctioning. Remote history of laryngectomy. EXAM: CT NECK WITHOUT CONTRAST TECHNIQUE: Multidetector CT imaging of the neck was performed following the standard protocol without intravenous contrast. RADIATION DOSE REDUCTION: This exam was performed according to the departmental dose-optimization program which includes automated exposure control, adjustment of the mA and/or kV according to patient size and/or use of iterative reconstruction technique. COMPARISON:  CT neck 08/08/2012, MR neck 08/17/2012 FINDINGS: Pharynx and larynx: The nasal cavity and nasopharynx are unremarkable. The oral cavity and oropharynx are unremarkable. The patient is status post total laryngectomy. A TEP device is in place which appears grossly in appropriate position. There is no definite abnormal soft tissue mass to suggest local recurrence, within the confines of noncontrast technique. The upper trachea is only  partially imaged but appears within normal limits. There is no retropharyngeal fluid collection. Salivary glands: The parotid glands are unremarkable. The submandibular glands are not identified, likely surgically absent. Thyroid: A small amount of thyroid tissue is identified, unremarkable. Lymph nodes: There is no pathologic lymphadenopathy in the neck. Vascular: There is bulky calcified plaque at the bilateral carotid bifurcations. Limited intracranial: The imaged portions of the intracranial compartment are unremarkable. Visualized orbits: The globes and orbits are unremarkable. Mastoids and visualized paranasal sinuses: Clear. Skeleton: There is degenerative change of the cervical spine most advanced at C5-C6 and C6-C7. There is no acute osseous abnormality or suspicious osseous lesion. Upper chest: There is prominent scarring in the lung apices  bilaterally. Other: None. IMPRESSION: 1. No specific evidence of tracheal injury or other acute finding identified in the neck. 2. Postsurgical changes reflecting total laryngectomy without definite evidence of local recurrence, within the confines of noncontrast technique. 3. No pathologic lymphadenopathy in the neck. Electronically Signed   By: Valetta Mole M.D.   On: 09/07/2022 15:58        Scheduled Meds:  acidophilus  1 capsule Oral Daily   apixaban  2.5 mg Oral BID   azithromycin  500 mg Oral Daily   carvedilol  25 mg Oral BID WC   Chlorhexidine Gluconate Cloth  6 each Topical Daily   cilostazol  100 mg Oral BID   diazepam  2.5 mg Oral Q12H   DULoxetine  20 mg Oral Daily   feeding supplement  237 mL Oral BID BM   fluticasone  2 spray Each Nare Daily   hydrALAZINE  100 mg Oral TID   isosorbide mononitrate  60 mg Oral Daily   levothyroxine  50 mcg Oral Q0600   loratadine  10 mg Oral Daily   mirtazapine  15 mg Oral QHS   mupirocin ointment  1 Application Nasal BID   mouth rinse  15 mL Mouth Rinse 4 times per day   [START ON 09/10/2022]  pantoprazole  40 mg Per Tube Daily   potassium chloride  20 mEq Oral Once   rosuvastatin  20 mg Oral QHS   sodium chloride flush  3 mL Intravenous Q12H   Continuous Infusions:  sodium chloride Stopped (09/08/22 1829)   cefTRIAXone (ROCEPHIN)  IV Stopped (09/08/22 1818)   clevidipine 11 mg/hr (09/09/22 1231)     LOS: 6 days    Time spent: 35 minutes    Barb Merino, MD [facility] Pager [phone]

## 2022-09-09 NOTE — Progress Notes (Signed)
Pt fio2 titrated to 28%, pt tolerating well at this time, RT will continue to monitor prn

## 2022-09-09 NOTE — Progress Notes (Signed)
NAME:  [name], MRN:  [id number], DOB:  08/10/1946, LOS: 6 ADMISSION DATE:  09/02/2022, CONSULTATION DATE:  9/25 REFERRING MD:  Eliseo Squires, CHIEF COMPLAINT: Hemoptysis  History of Present Illness:  76 year old female with a complicated past medical history admitted on September 02, 2022 in the setting of acute respiratory failure with hypoxemia secondary to a CHF exacerbation.  Noted to have worsening renal insufficiency and evidence of demand ischemia.  Nephrology and cardiology were consulted. Pulmonary and critical care medicine was consulted on September 07, 2022 in the setting of hemoptysis from her tracheal stoma.  The patient has a remote history of laryngectomy for head neck cancer.  During this admission she is required some suctioning from her tracheal stoma.  This morning she was noted to be coughing up bright red blood.  She denies respiratory distress.  However, she says that she does have some trouble swallowing and she feels some tenderness in her neck.  She also notes some mild stomach pain.  She denies chest pain.  Of note, the patient had an esophageal dilation as well as tracheal stoma dilation by ENT at [facility] on July 16, 2022.  Pertinent  Medical History  Atrial fibrillation Hypertension Anxiety Depression Hyperlipidemia PAD COPD Chronic pain Dysphagia CKD stage IV GERD Hypothyroidism History of head and neck cancer status post laryngectomy Anemia History of stroke  Significant Hospital Events: Including procedures, antibiotic start and stop dates in addition to other pertinent events   September 02, 2022 admitted for CHF exacerbation September 07, 2022 hemoptysis after tracheal suctioning through stoma 9/26 nausea causing anti-htn to be held. SBP 200s. SOB, chest pain. Tx to ICU.  Noted to have laceration at trach stoma, daughter had been helping the patient remove scabs on trach stoma. 9/27 bleeding resolved, feels fine  Interim History /  Subjective:   Feels better  No bleeding Breathing is OK  Objective   Blood pressure (!) 189/170, pulse 80, temperature 98.6 F (37 C), temperature source Oral, resp. rate 18, height '5\' 3"'$  (1.6 m), weight 49.7 kg, SpO2 95 %.    FiO2 (%):  [28 %-40 %] 28 %   Intake/Output Summary (Last 24 hours) at 09/09/2022 0942 Last data filed at 09/09/2022 8466 Gross per 24 hour  Intake 399.45 ml  Output 650 ml  Net -250.55 ml   Filed Weights   09/05/22 0604 09/06/22 0500 09/09/22 0500  Weight: 52.3 kg 50.4 kg 49.7 kg    Examination:  General:  Resting comfortably in bed HENT: NCAT OP clear stoma with old dried blood, small laceration at 2 o'clock healing PULM: CTA B, normal effort CV: RRR, no mgr GI: BS+, soft, nontender MSK: normal bulk and tone Neuro: awake, alert, no distress, MAEW    Resolved Hospital Problem list     Assessment & Plan:  Hemoptysis: due to suction trauma and the patient manipulating her tracheostomy stoma Head and neck cancer, status post remote laryngectomy History of esophageal stricture Atrial fibrillation on Eliquis CHF, acute exacerbation of diastolic heart failure  Discussion: Improved today.  She needs to stop manipulating her trach stoma, counseled her and her daughter at length.  (Daughter reports she picks at it with tweezers at home, I told her to stop.)  -OK to resume Eliquis and monitor for bleeding -humidified air/oxygen as needed to maintain O2 saturation > 88% -diuretics/hypertensive management per [facility]   PCCM will sign off  Best Practice (right click and "Reselect all SmartList Selections" daily)   Per  Berino   Critical care time: n/a    Roselie Awkward, MD [place] [facility] Pager: [phone] Cell: [phone] After 7:00 pm call Elink  [phone]   09/09/2022 9:42 AM

## 2022-09-09 NOTE — Consult Note (Signed)
Consultation Note Date: 09/09/2022   Patient Name: [name]  DOB: 04-20-1946  MRN: [id number]  Age / Sex: 76 y.o., female  PCP: Hoyt Koch, MD Referring Physician: Barb Merino, MD  Reason for Consultation: Establishing goals of care, Non pain symptom management, and Other  HPI/Patient Profile: 76 y.o. 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 She has good support from her daughter-in-law and son and is cognitively intact and participates actively in her care planning.  Palliative Care was consulted after an acute event yesterday in which she had acute hemoptysis and bleeding from her trach that obstructed her airway. Bleeding was found to be superficial and caused from manual disruption of trach site. Request for goals of care discussion and assessment for potential unmet palliative care needs.  Clinical Assessment and Goals of Care: Patient appears to be doing well. She is engaging and eating ice cream. Her daughter in law is at the bedside. Her goals of care include treatment of reversible medical problems and to live the best quality of life for as long as possible. She is mostly independent with her ADLs and is supported by her family.  I had a discussion about CODE STATUS. She would like all life saving interventions done outside of being pulseless and not breathing- no CPR no artificial life support.  She identifies the following symptoms:  -no pain -struggles with anxiety -has an essential tremor -difficulty swallowing pills  SUMMARY OF RECOMMENDATIONS    Code Status/Advance Care Planning: DNR- discussion held with patient and her DIL at bedside  Symptom Management:  Anxiety: discussed  doing a trial of LONGER ACTING benzodiazapine for essential tremor-she says the lorazepam does not work for very long. Will start her on Valium instead of lorazepam. No side effects and has been on this medication for many years. For dysphagia she asks if meds can be switched to liquids-this seems reasonable until she can be seen at [facility] for trach dilation.  Discharge Planning: We discussed having her follow-up in outpatient cancer center clinic as needed for symptom management.     Primary Diagnoses: Present on Admission:  COPD (chronic obstructive pulmonary disease) (HCC)  GAD (generalized anxiety disorder)  Hypertension  PAF (paroxysmal atrial fibrillation)-CHADS2 Vas score of 5  Dysphagia, pharyngoesophageal phase  Moderate episode of recurrent major depressive disorder (HCC)  Hyperlipidemia LDL goal <130  CKD stage 4 secondary to hypertension (HCC)  PAD (peripheral artery disease) (HCC)  Hypothyroidism  Anemia in CKD (chronic kidney disease)  Hypertensive emergency  Demand ischemia ([place])  Dyspnea   I have reviewed the medical record, interviewed the patient and family, and examined the patient. The following aspects are pertinent.  Past Medical History:  Diagnosis Date   Anemia    Anxiety    takes Ativan prn   Blood transfusion without reported diagnosis 09/15/12   2 units  Prbc's   Broken ribs    Chronic back pain    CKD (chronic kidney disease), stage IV (HCC)    Constipation    related to pain meds   COPD (chronic obstructive pulmonary disease) ([place]) 08/10/2012   denies   Depression    Gastrostomy in place [facility])    removed   GERD (gastroesophageal reflux disease)    takes Zantac daily   Headache(784.0)    Hiatal hernia 08/10/2012   History of radiation therapy 10/17/12-11/25/12   supraglottic larynx,high risk neck tumor bed 5880 cGy/28 sessions, high risk lymph node tumor bed 5600 cGy/20 sessions, mod risk lymph node tumor bed 5040 cGy/20 sessions    Hypercholesteremia    takes Pravastatin daily   Hypertension    takes Tribenzor and Atenolol daily   Insomnia    takes Amitriptyline daily   Nausea    takes Zofran prn   PAD (peripheral artery disease) ([place])    noninvasive imaging in 2016   Pneumonia    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 no residual   Uterine cancer [facility])    Social History   Socioeconomic History   Marital status: Widowed    Spouse name: Not on file   Number of children: 4   Years of education: Not on file   Highest education level: Not on file  Occupational History    Comment: retired Regulatory affairs officer  Tobacco Use   Smoking status: Former    Packs/day: 0.25    Years: 50.00    Total pack years: 12.50    Types: Cigarettes    Quit date: 05/27/2013    Years since quitting: 9.2   Smokeless tobacco: Never  Vaping Use   Vaping Use: Never used  Substance and Sexual Activity   Alcohol use: Yes   Drug use: No   Sexual activity: Not Currently  Other Topics Concern   Not on file  Social History Narrative   ** Merged History Encounter **       Social Determinants of Health   Financial Resource Strain: Not on file  Food Insecurity: No Food Insecurity (09/03/2022)   Hunger Vital Sign    Worried About Running Out of Food in the Last Year: Never true    Ran Out of Food in the Last Year: Never true  Transportation Needs: No Transportation Needs (09/03/2022)   PRAPARE - Hydrologist (Medical): No    Lack of Transportation (Non-Medical): No  Physical Activity: Not on file  Stress: Not on file  Social Connections: Socially Isolated (01/16/2021)   Social Connection and Isolation Panel [NHANES]    Frequency of Communication with Friends and Family: Once a week    Frequency of Social Gatherings with Friends and Family: Once a week    Attends Religious Services: More than 4 times per year    Active  Member of [employer] or Organizations: No    Attends Archivist Meetings: Never    Marital Status: Widowed   Family History  Problem Relation Age of Onset   Throat cancer Father    Brain cancer Brother    CAD Neg Hx    Scheduled Meds:  acidophilus  1 capsule Oral Daily   apixaban  2.5 mg Oral BID   azithromycin  500 mg Oral Daily   carvedilol  25 mg Oral BID WC   Chlorhexidine Gluconate  Cloth  6 each Topical Daily   cilostazol  100 mg Oral BID   DULoxetine  20 mg Oral Daily   feeding supplement  237 mL Oral BID BM   fluticasone  2 spray Each Nare Daily   hydrALAZINE  100 mg Oral TID   isosorbide mononitrate  60 mg Oral Daily   levothyroxine  50 mcg Oral Q0600   loratadine  10 mg Oral Daily   LORazepam  1 mg Intravenous Once   mirtazapine  15 mg Oral QHS   mupirocin ointment  1 Application Nasal BID   mouth rinse  15 mL Mouth Rinse 4 times per day   pantoprazole  40 mg Oral Daily   potassium chloride  20 mEq Oral Once   rosuvastatin  20 mg Oral QHS   sodium chloride flush  3 mL Intravenous Q12H   Continuous Infusions:  sodium chloride Stopped (09/08/22 1829)   cefTRIAXone (ROCEPHIN)  IV Stopped (09/08/22 1818)   clevidipine 14 mg/hr (09/09/22 1019)   PRN Meds:.sodium chloride, acetaminophen **OR** acetaminophen, guaiFENesin, hydrALAZINE, HYDROmorphone (DILAUDID) injection, labetalol, loperamide, LORazepam, melatonin, ondansetron (ZOFRAN) IV, mouth rinse, phenol Medications Prior to Admission:  Prior to Admission medications   Medication Sig Start Date End Date Taking? Authorizing Provider  acetaminophen (TYLENOL) 500 MG tablet Take 1,000 mg by mouth as needed for headache.   Yes [provider]  apixaban (ELIQUIS) 2.5 MG TABS tablet Take 1 tablet (2.5 mg total) by mouth 2 (two) times daily. 02/10/22  Yes Hoyt Koch, MD  carvedilol (COREG) 25 MG tablet TAKE 1 TABLET (25 MG TOTAL) BY MOUTH TWICE A DAY WITH MEALS Patient taking differently: Take 25 mg  by mouth 2 (two) times daily with a meal. 05/26/22  Yes Hoyt Koch, MD  cilostazol (PLETAL) 100 MG tablet TAKE 1 TABLET BY MOUTH TWICE A DAY Patient taking differently: Take 100 mg by mouth 2 (two) times daily. 02/03/22  Yes Hoyt Koch, MD  docusate sodium (COLACE) 100 MG capsule Take 1 capsule (100 mg total) by mouth daily. 01/09/22  Yes Little Ishikawa, MD  DULoxetine (CYMBALTA) 20 MG capsule TAKE 1 CAPSULE BY MOUTH EVERY DAY Patient taking differently: Take 20 mg by mouth daily. 07/23/22  Yes Hoyt Koch, MD  fluticasone (FLONASE) 50 MCG/ACT nasal spray SPRAY 2 SPRAYS INTO EACH NOSTRIL EVERY DAY Patient taking differently: Place 2 sprays into both nostrils daily. 01/31/22  Yes Janith Lima, MD  furosemide (LASIX) 20 MG tablet Take 1 tablet (20 mg total) by mouth daily as needed. 05/20/22  Yes Hoyt Koch, MD  hydrALAZINE (APRESOLINE) 100 MG tablet Take 1 tablet (100 mg total) by mouth 3 (three) times daily. 03/03/22  Yes Hoyt Koch, MD  isosorbide mononitrate (IMDUR) 60 MG 24 hr tablet TAKE 1 TABLET BY MOUTH EVERY DAY Patient taking differently: Take 60 mg by mouth daily. 07/29/22  Yes Hoyt Koch, MD  levothyroxine (SYNTHROID) 50 MCG tablet TAKE 1 TABLET BY MOUTH EVERY DAY BEFORE BREAKFAST Patient taking differently: Take 50 mcg by mouth daily before breakfast. 04/30/22  Yes Hoyt Koch, MD  loperamide (IMODIUM A-D) 2 MG tablet Take 2 mg by mouth as needed for diarrhea or loose stools.   Yes [provider]  LORazepam (ATIVAN) 0.5 MG tablet Take 0.5 mg by mouth 2 (two) times daily. 08/22/22  Yes [provider]  LORazepam (ATIVAN) 1 MG tablet Take 1 tablet (1 mg total) by mouth 2 (two) times daily  as needed for anxiety. 08/13/22  Yes Hoyt Koch, MD  mirtazapine (REMERON) 15 MG tablet Take 15 mg by mouth at bedtime.   Yes [provider]  ondansetron (ZOFRAN) 4 MG tablet TAKE 1 TABLET BY  MOUTH EVERY 8 HOURS AS NEEDED FOR NAUSEA AND VOMITING Patient taking differently: Take 4 mg by mouth as needed for nausea or vomiting. 02/03/22  Yes Hoyt Koch, MD  rosuvastatin (CRESTOR) 20 MG tablet TAKE 1 TABLET (20 MG TOTAL) BY MOUTH DAILY. Patient taking differently: Take 20 mg by mouth at bedtime. 07/29/22  Yes Hoyt Koch, MD  amLODipine (NORVASC) 10 MG tablet Take 1 tablet (10 mg total) by mouth daily. Patient not taking: Reported on 09/02/2022 01/09/22   Little Ishikawa, MD  Ensure (ENSURE) Take 237 mLs by mouth daily.    [provider]  predniSONE (DELTASONE) 20 MG tablet Days 1-7 take 2 pills daily. Days 8-14 take 1 pill daily Patient not taking: Reported on 09/02/2022 08/13/22   Hoyt Koch, MD  levocetirizine (XYZAL) 5 MG tablet TAKE 1 TABLET(5 MG) BY MOUTH EVERY EVENING 02/21/20 06/10/20  Janith Lima, MD   Allergies  Allergen Reactions   Xyzal [Levocetirizine Dihydrochloride] Itching   Augmentin [Amoxicillin-Pot Clavulanate] Diarrhea and Nausea And Vomiting   Tribenzor [Olmesartan-Amlodipine-Hctz] Other (See Comments)    "Hurt the kidneys"   Review of Systems  Physical Exam  Vital Signs: BP (!) 164/85 (BP Location: Left Arm)   Pulse 80   Temp 98.6 F (37 C) (Oral)   Resp 18   Ht '5\' 3"'$  (1.6 m)   Wt 49.7 kg   SpO2 94%   BMI 19.41 kg/m  Pain Scale: 0-10 POSS *See Group Information*: 1-Acceptable,Awake and alert Pain Score: 0-No pain   SpO2: SpO2: 94 % O2 Device:SpO2: 94 % O2 Flow Rate: .O2 Flow Rate (L/min): 5 L/min  IO: Intake/output summary:  Intake/Output Summary (Last 24 hours) at 09/09/2022 1128 Last data filed at 09/09/2022 1019 Gross per 24 hour  Intake 470.15 ml  Output 650 ml  Net -179.85 ml    LBM: Last BM Date : 09/07/22 Baseline Weight: Weight: 53.8 kg Most recent weight: Weight: 49.7 kg     Palliative Assessment/Data:   Time: 70 min Greater than 50%  of this time was spent counseling and  coordinating care related to the above assessment and plan.  Signed by: Lane Hacker, DO   Please contact Palliative Medicine Team phone at [phone] for questions and concerns.  For individual provider: See Shea Evans

## 2022-09-10 DIAGNOSIS — I5033 Acute on chronic diastolic (congestive) heart failure: Secondary | ICD-10-CM | POA: Diagnosis not present

## 2022-09-10 LAB — BASIC METABOLIC PANEL
Anion gap: 12 (ref 5–15)
BUN: 44 mg/dL — ABNORMAL HIGH (ref 8–23)
CO2: 29 mmol/L (ref 22–32)
Calcium: 8 mg/dL — ABNORMAL LOW (ref 8.9–10.3)
Chloride: 101 mmol/L (ref 98–111)
Creatinine, Ser: 3.99 mg/dL — ABNORMAL HIGH (ref 0.44–1.00)
GFR, Estimated: 11 mL/min — ABNORMAL LOW (ref >60)
Glucose, Bld: 104 mg/dL — ABNORMAL HIGH (ref 70–99)
Potassium: 3 mmol/L — ABNORMAL LOW (ref 3.5–5.1)
Sodium: 142 mmol/L (ref 135–145)

## 2022-09-10 MED ORDER — POTASSIUM CHLORIDE 20 MEQ PO PACK
20.0000 meq | PACK | Freq: Once | ORAL | Status: AC
  Administered 2022-09-10: 20 meq via ORAL
  Filled 2022-09-10: qty 1

## 2022-09-10 MED ORDER — AMLODIPINE BESYLATE 10 MG PO TABS
10.0000 mg | ORAL_TABLET | Freq: Every day | ORAL | Status: DC
  Filled 2022-09-10: qty 1

## 2022-09-10 MED ORDER — PANTOPRAZOLE 2 MG/ML SUSPENSION
40.0000 mg | Freq: Every day | ORAL | Status: DC
  Filled 2022-09-10: qty 20

## 2022-09-10 MED ORDER — PANTOPRAZOLE SODIUM 40 MG PO TBEC
40.0000 mg | DELAYED_RELEASE_TABLET | Freq: Every day | ORAL | Status: DC
  Administered 2022-09-10: 40 mg via ORAL
  Filled 2022-09-10: qty 1

## 2022-09-10 MED ORDER — DIAZEPAM 1 MG/ML PO SOLN
2.5000 mg | Freq: Two times a day (BID) | ORAL | Status: DC
  Administered 2022-09-10 – 2022-09-15 (×11): 2.5 mg via ORAL
  Filled 2022-09-10 (×11): qty 5

## 2022-09-10 MED ORDER — ALBUTEROL SULFATE (2.5 MG/3ML) 0.083% IN NEBU
INHALATION_SOLUTION | RESPIRATORY_TRACT | Status: AC
  Administered 2022-09-10: 2.5 mg
  Filled 2022-09-10: qty 3

## 2022-09-10 MED ORDER — ALBUTEROL SULFATE (2.5 MG/3ML) 0.083% IN NEBU: 2.5000 mg | INHALATION_SOLUTION | RESPIRATORY_TRACT | Status: DC | PRN

## 2022-09-10 NOTE — Evaluation (Signed)
Physical Therapy Evaluation Patient Details Name: [name] MRN: [id number] DOB: 1946-09-24 Today's Date: 09/10/2022  History of Present Illness  76 y.o. female with medical history significant of atrial fibrillation, hypertension, anxiety, depression, hyperlipidemia, PAD, COPD, chronic pain, dysphagia, CKD 4, GERD, hypothyroidism, history of head neck cancer status post laryngectomy, anemia, CVA presenting with ongoing shortness of breath and weakness.  Does not wear O2 at home.   Patient treated with diuresis with worsening renal function  Clinical Impression  Pt admitted with above diagnosis. Pt ambulated 200' in hallway with handheld assist of 1, no loss of balance, SaO2 99% on 6L O2. Good progress expected. Pt ambulates without a device at baseline. Son present and interpreted, he is very supportive and engaged in her care.  Pt currently with functional limitations due to the deficits listed below (see PT Problem List). Pt will benefit from skilled PT to increase their independence and safety with mobility to allow discharge to the venue listed below.          Recommendations for follow up therapy are one component of a multi-disciplinary discharge planning process, led by the attending physician.  Recommendations may be updated based on patient status, additional functional criteria and insurance authorization.  Follow Up Recommendations Home health PT      Assistance Recommended at Discharge Set up Supervision/Assistance  Patient can return home with the following       Equipment Recommendations None recommended by PT  Recommendations for Other Services       Functional Status Assessment       Precautions / Restrictions Precautions Precautions: Fall Precaution Comments: monitor O2 (not on O2 at baseline), has trach Restrictions Weight Bearing Restrictions: No      Mobility  Bed Mobility Overal bed mobility: Needs Assistance Bed Mobility: Supine to Sit      Supine to sit: HOB elevated, Supervision     General bed mobility comments: HOB up, used rail    Transfers Overall transfer level: Needs assistance Equipment used: None Transfers: Sit to/from Stand Sit to Stand: Min guard                Ambulation/Gait Ambulation/Gait assistance: Min guard Gait Distance (Feet): 200 Feet Assistive device: 1 person hand held assist Gait Pattern/deviations: Step-through pattern, Decreased stride length Gait velocity: decr     General Gait Details: 15' x 2 bed to bathroom to recliner, SaO2 98% on 8L trach collar plus 200' in hallway without loss of balance  Stairs            Wheelchair Mobility    Modified Rankin (Stroke Patients Only)       Balance Overall balance assessment: Needs assistance   Sitting balance-Leahy Scale: Good       Standing balance-Leahy Scale: Fair                               Pertinent Vitals/Pain Pain Assessment Pain Assessment: No/denies pain    Home Living Family/patient expects to be discharged to:: Private residence Living Arrangements: Children Available Help at Discharge: Family;Available PRN/intermittently Type of Home: House Home Access: Level entry       Home Layout: One level Home Equipment: Shower seat Additional Comments: information gleaned from chart review    Prior Function Prior Level of Function : Independent/Modified Independent             Mobility Comments: independent with walking, 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 ADLs Comments: independent bathing and dressing; does not drive     Hand Dominance        Extremity/Trunk Assessment   Upper Extremity Assessment Upper Extremity Assessment: Overall WFL for tasks assessed    Lower Extremity Assessment Lower Extremity Assessment: Overall WFL for tasks assessed    Cervical / Trunk Assessment Cervical / Trunk Assessment: Normal  Communication   Communication: Prefers language other than [place]  (son interpreted)  Cognition Arousal/Alertness: Awake/alert Behavior During Therapy: WFL for tasks assessed/performed Overall Cognitive Status: Within Functional Limits for tasks assessed                                          General Comments      Exercises     Assessment/Plan    PT Assessment Patient needs continued PT services  PT Problem List Decreased activity tolerance       PT Treatment Interventions Gait training;Patient/family education;Therapeutic exercise;Therapeutic activities;Functional mobility training    PT Goals (Current goals can be found in the Care Plan section)  Acute Rehab PT Goals Patient Stated Goal: return to work at [employer] PT Goal Formulation: With patient/family Time For Goal Achievement: 09/24/22 Potential to Achieve Goals: Good    Frequency Min 3X/week     Co-evaluation               AM-PAC PT "6 Clicks" Mobility  Outcome Measure Help needed turning from your back to your side while in a flat bed without using bedrails?: None Help needed moving from lying on your back to sitting on the side of a flat bed without using bedrails?: A Little Help needed moving to and from a bed to a chair (including a wheelchair)?: None Help needed standing up from a chair using your arms (e.g., wheelchair or bedside chair)?: None Help needed to walk in hospital room?: A Little Help needed climbing 3-5 steps with a railing? : A Little 6 Click Score: 21    End of Session Equipment Utilized During Treatment: Oxygen Activity Tolerance: Patient tolerated treatment well Patient left: in chair;with call bell/phone within reach;with family/visitor present;with nursing/sitter in room Nurse Communication: Mobility status PT Visit Diagnosis: Difficulty in walking, not elsewhere classified (R26.2)    Time: 9379-0240 PT Time Calculation (min) (ACUTE ONLY): 47 min   Charges:   PT Evaluation $PT Eval Moderate Complexity: 1 Mod PT  Treatments $Gait Training: 8-22 mins $Therapeutic Activity: 8-22 mins        Blondell Reveal Kistler PT 09/10/2022  [place]  Office [phone]

## 2022-09-10 NOTE — Progress Notes (Signed)
PROGRESS NOTE    [name]  CBJ:[id number] DOB: 07/31/1946 DOA: 09/02/2022 PCP: Hoyt Koch, MD    Brief Narrative:  [name] is a 76 y.o. female with medical history significant of atrial fibrillation, hypertension, anxiety, depression, hyperlipidemia, PAD, COPD, chronic pain, dysphagia, CKD 4, GERD, hypothyroidism, history of head neck cancer status post laryngectomy, anemia, CVA presenting with ongoing shortness of breath and weakness.  Does not wear O2 at home.  Patient treated with diuresis with worsening renal function started to stabilize now. Blood pressure control was major issue.  She was transferred to stepdown unit and treated with Cleviprex. Also started having bleeding through the stoma on her neck, treated conservatively.    Assessment & Plan:   Acute on chronic diastolic congestive heart failure Essential hypertension with hypertensive emergency Demand ischemia  Patient was on the medical floor, blood pressure more than 230, did not respond to IV hydralazine.  Most of the time she was nauseated and unable to take oral medications. 9/26, chest tightness and headache with elevated blood pressures.  Transferred to SDU and currently on Cleviprex.  Nausea improved. Patient was able to take oral medications including carvedilol, hydralazine, Imdur.  Wean off Cleviprex. Nephrology to titrate medications. Continue as needed labetalol hydralazine.  Acute respiratory failure with hypoxemia: Does not wear oxygen at home.  She was on 5 L of oxygen through her tracheostomy stoma.  Clinically stabilizing.  She did have evidence of shortness of breath.  Patient was presumptively treated for pneumonia with IV antibiotics, completed 5 days of therapy. Wean off Mobilize with PT.  Bleeding through stoma: Mechanical injury with coagulopathy from Eliquis.  Stabilized.  On Eliquis.  No more bleeding. She is scheduled to have tracheal stretching next week at [facility].  Paroxysmal A-fib: Currently sinus rhythm.  Continue carvedilol, Eliquis was resumed.  Peripheral arterial disease on rosuvastatin and cilostazol.  Acute kidney injury on CKD stage IV: Treated conservatively.  stabilizing.  Creatinine 3.99 today which is at about baseline.  Nephrology following.  Hypothyroidism: On Synthroid.  Anxiety/depression COPD Head and neck cancer status post laryngectomy Esophageal stricture  Chronic medical issues.  Stable.  Work with PT OT.  Anticipate home tomorrow.  May need oxygen.  DVT prophylaxis: apixaban (ELIQUIS) tablet 2.5 mg Start: 09/09/22 1030 Place and maintain sequential compression device Start: 09/07/22 1154 apixaban (ELIQUIS) tablet 2.5 mg   Code Status: DNR Family Communication: daughter-in-law at the bedside Disposition Plan: Status is: Inpatient Remains inpatient appropriate because: On Cleviprex infusion     Consultants:  Critical care Cardiology Nephrology  Procedures:  None  Antimicrobials:  Rocephin azithromycin 9/23--- 9/27   Subjective: Patient seen and examined.  Daughter arrived to the bedside.  No overnight events.  Patient herself denies any complaints.  Today she feels sleepy. Since last 24 hours, she had been able to eat at her usual self.  Patient does take time to eat, she has to maneuver her food to swallow because of esophageal stricture but denies any nausea or vomiting. No evidence of bleeding through the stoma reported.  Objective: Vitals:   09/10/22 0907 09/10/22 0930 09/10/22 1000 09/10/22 1030  BP: (!) 126/37 (!) 140/30 (!) 162/56 (!) 147/55  Pulse: 86 94 70 99  Resp: (!) 8 (!) '3 16 15  '$ Temp:      TempSrc:      SpO2: 96% 95% 95% 98%  Weight:      Height:        Intake/Output  Summary (Last 24 hours) at 09/10/2022 1128 Last data filed at 09/10/2022 1000 Gross per 24 hour  Intake 607.92 ml  Output --  Net 607.92 ml   Filed Weights   09/06/22 0500 09/09/22 0500 09/10/22 0500   Weight: 50.4 kg 49.7 kg 53.4 kg    Examination:  General exam: Appears calm and comfortable, sleepy.  Thin and frail. Respiratory system: Bilateral clear.  Mostly conducted upper airway sounds. Tracheostomy stoma on her neck with dry scabs but no bleeding. Cardiovascular system: S1 & S2 heard, irregularly irregular. Gastrointestinal system: Abdomen is nondistended, soft and nontender. No organomegaly or masses felt. Normal bowel sounds heard. Central nervous system: Alert and oriented. No focal neurological deficits. Extremities: Symmetric 5 x 5 power.    Data Reviewed: I have personally reviewed following labs and imaging studies  CBC: Recent Labs  Lab 09/04/22 0459 09/06/22 0508 09/07/22 0850 09/08/22 0509 09/09/22 0317  WBC 7.4 9.2 9.9 9.7 8.3  HGB 7.7* 7.7* 7.1* 7.1* 7.7*  HCT 24.4* 25.0* 22.2* 22.7* 24.2*  MCV 88.7 89.0 87.4 88.3 87.4  PLT 196 242 255 249 509   Basic Metabolic Panel: Recent Labs  Lab 09/04/22 0459 09/06/22 0508 09/07/22 0440 09/08/22 0509 09/09/22 0317 09/10/22 0755  NA 139 139 138 137 142 142  K 4.3 4.4 3.7 3.2* 3.0* 3.0*  CL 109 108 106 98 98 101  CO2 19* 18* 21* '27 30 29  '$ GLUCOSE 101* 103* 121* 142* 101* 104*  BUN 59* 65* 64* 54* 46* 44*  CREATININE 4.06* 4.96*  4.88* 5.09* 4.49* 4.09* 3.99*  CALCIUM 8.4* 8.5* 8.1* 7.6* 8.1* 8.0*  MG 2.3  --   --   --   --   --   PHOS  --  7.6* 5.8*  --   --   --    GFR: Estimated Creatinine Clearance: 9.9 mL/min (A) (by C-G formula based on SCr of 3.99 mg/dL (H)). Liver Function Tests: Recent Labs  Lab 09/06/22 0508 09/07/22 0440  ALBUMIN 2.5* 2.3*   No results for input(s): "LIPASE", "AMYLASE" in the last 168 hours. No results for input(s): "AMMONIA" in the last 168 hours. Coagulation Profile: No results for input(s): "INR", "PROTIME" in the last 168 hours. Cardiac Enzymes: No results for input(s): "CKTOTAL", "CKMB", "CKMBINDEX", "TROPONINI" in the last 168 hours. BNP (last 3 results) No  results for input(s): "PROBNP" in the last 8760 hours. HbA1C: No results for input(s): "HGBA1C" in the last 72 hours. CBG: No results for input(s): "GLUCAP" in the last 168 hours. Lipid Profile: Recent Labs    09/09/22 0317  TRIG 121   Thyroid Function Tests: No results for input(s): "TSH", "T4TOTAL", "FREET4", "T3FREE", "THYROIDAB" in the last 72 hours. Anemia Panel: No results for input(s): "VITAMINB12", "FOLATE", "FERRITIN", "TIBC", "IRON", "RETICCTPCT" in the last 72 hours. Sepsis Labs: Recent Labs  Lab 09/05/22 1216 09/06/22 0508 09/07/22 0440  PROCALCITON 6.40 5.31 3.37    Recent Results (from the past 240 hour(s))  MRSA Next Gen by PCR, Nasal     Status: Abnormal   Collection Time: 09/08/22 12:52 PM   Specimen: Nasal Mucosa; Nasal Swab  Result Value Ref Range Status   MRSA by PCR Next Gen DETECTED (A) NOT DETECTED Final    Comment: CRITICAL RESULT CALLED TO, READ BACK BY AND VERIFIED WITH: Ambrose Finland RN @ 1956 09/08/2022 PER ENGLAD, K (NOTE) The GeneXpert MRSA Assay (FDA approved for NASAL specimens only), is one component of a comprehensive MRSA colonization surveillance program. It  is not intended to diagnose MRSA infection nor to guide or monitor treatment for MRSA infections. Test performance is not FDA approved in patients less than 54 years old. Performed at [facility], [place] [address], [place], [place] [address]          Radiology Studies: No results found.      Scheduled Meds:  acidophilus  1 capsule Oral Daily   apixaban  2.5 mg Oral BID   carvedilol  25 mg Oral BID WC   Chlorhexidine Gluconate Cloth  6 each Topical Daily   cilostazol  100 mg Oral BID   diazepam  2.5 mg Oral Q12H   DULoxetine  20 mg Oral Daily   feeding supplement  237 mL Oral BID BM   fluticasone  2 spray Each Nare Daily   hydrALAZINE  100 mg Oral TID   isosorbide mononitrate  60 mg Oral Daily   levothyroxine  50 mcg Oral Q0600   loratadine   10 mg Oral Daily   mirtazapine  15 mg Oral QHS   mupirocin ointment  1 Application Nasal BID   mouth rinse  15 mL Mouth Rinse 4 times per day   pantoprazole  40 mg Oral Daily   potassium chloride  20 mEq Oral Once   rosuvastatin  20 mg Oral QHS   sodium chloride flush  3 mL Intravenous Q12H   Continuous Infusions:  sodium chloride Stopped (09/08/22 1829)   clevidipine 5 mg/hr (09/10/22 1000)     LOS: 7 days    Time spent: 35 minutes    Barb Merino, MD [facility] Pager [phone]

## 2022-09-10 NOTE — Plan of Care (Signed)

## 2022-09-10 NOTE — Progress Notes (Signed)
[facility] Progress Note   Assessment/ Plan:   Assessment/ Plan: AKI on CKD 5 - b/l creat is 3.2- 3.6 from august 2023, eGFR 12-14 ml/min. Creat here was 3.5 on admission, which is in normal range for her. With diuresis for suspected CHF the CXR changes have improved.  Cr appears to have peaked and plateaued - renal US negative for obstruction - off IVFs, Cr improved HTNsive urgency : initally were high, then got better.  Started on gtt with hypertensive urgency.  Coming down off gtt with taking pills- hopefully we can wean off.  Palliative care has recommended valium which I hope helps her anxiety and may help her BP too.  Will add amlodipine at night in addition to meds to smooth out lack of nocturnal dipping Atrial fib COPD Hx head/ neck cancer / sp laryngectomy w/ stoma Hemoptysis: pulmonary consulted, plan for CT of the neck-- no tracheal injury.  Appears to be laceration on stoma and not coming from lungs. Anemia: add on iron panel, likely will need ESA Dispo: pending, possibly tomorrow  Subjective:    Pressures are better, looks like she may need some QHS meds to smooth out pressures at night.    Objective:   BP (!) 147/55   Pulse 99   Temp 98.6 F (37 C) (Oral)   Resp 15   Ht _0  (1.6 m)   Wt 53.4 kg   SpO2 98%   BMI 20.85 kg/m   Intake/Output Summary (Last 24 hours) at 09/10/2022 1129 Last data filed at 09/10/2022 1000 Gross per 24 hour  Intake 607.92 ml  Output --  Net 607.92 ml   Weight change: 3.7 kg  Physical Exam: Gen: sleeping, arousable NECK: + stoma good CVS:RRR Resp: clear FAO:ZHYQ  Ext: no LE edema  Imaging: No results found.  Labs: BMET Recent Labs  Lab 09/04/22 0459 09/06/22 0508 09/07/22 0440 09/08/22 0509 09/09/22 0317 09/10/22 0755  NA 139 139 138 137 142 142  K 4.3 4.4 3.7 3.2* 3.0* 3.0*  CL 109 108 106 98 98 101  CO2 19* 18* 21* _1 GLUCOSE 101* 103* 121* 142* 101* 104*  BUN 59* 65* 64* 54* 46* 44*   CREATININE 4.06* 4.96*  4.88* 5.09* 4.49* 4.09* 3.99*  CALCIUM 8.4* 8.5* 8.1* 7.6* 8.1* 8.0*  PHOS  --  7.6* 5.8*  --   --   --    CBC Recent Labs  Lab 09/06/22 0508 09/07/22 0850 09/08/22 0509 09/09/22 0317  WBC 9.2 9.9 9.7 8.3  HGB 7.7* 7.1* 7.1* 7.7*  HCT 25.0* 22.2* 22.7* 24.2*  MCV 89.0 87.4 88.3 87.4  PLT 242 255 249 307    Medications:     acidophilus  1 capsule Oral Daily   apixaban  2.5 mg Oral BID   carvedilol  25 mg Oral BID WC   Chlorhexidine Gluconate Cloth  6 each Topical Daily   cilostazol  100 mg Oral BID   diazepam  2.5 mg Oral Q12H   DULoxetine  20 mg Oral Daily   feeding supplement  237 mL Oral BID BM   fluticasone  2 spray Each Nare Daily   hydrALAZINE  100 mg Oral TID   isosorbide mononitrate  60 mg Oral Daily   levothyroxine  50 mcg Oral Q0600   loratadine  10 mg Oral Daily   mirtazapine  15 mg Oral QHS   mupirocin ointment  1 Application Nasal BID   mouth rinse  15  mL Mouth Rinse 4 times per day   pantoprazole  40 mg Oral Daily   potassium chloride  20 mEq Oral Once   rosuvastatin  20 mg Oral QHS   sodium chloride flush  3 mL Intravenous Q12H    Madelon Lips MD 09/10/2022, 11:29 AM

## 2022-09-11 DIAGNOSIS — I5033 Acute on chronic diastolic (congestive) heart failure: Secondary | ICD-10-CM | POA: Diagnosis not present

## 2022-09-11 MED ORDER — METRONIDAZOLE 50 MG/ML ORAL SUSPENSION
500.0000 mg | Freq: Two times a day (BID) | ORAL | Status: DC
  Administered 2022-09-11 – 2022-09-17 (×13): 500 mg via ORAL
  Filled 2022-09-11 (×16): qty 10

## 2022-09-11 MED ORDER — LEVOFLOXACIN 25 MG/ML PO SOLN: 250.0000 mg | ORAL | Status: DC

## 2022-09-11 MED ORDER — CEFDINIR 125 MG/5ML PO SUSR
300.0000 mg | Freq: Every day | ORAL | Status: AC
  Administered 2022-09-11 – 2022-09-17 (×7): 300 mg via ORAL
  Filled 2022-09-11 (×6): qty 15
  Filled 2022-09-11: qty 6

## 2022-09-11 MED ORDER — LABETALOL HCL 5 MG/ML IV SOLN
5.0000 mg | Freq: Once | INTRAVENOUS | Status: AC
  Administered 2022-09-11: 5 mg via INTRAVENOUS
  Filled 2022-09-11: qty 4

## 2022-09-11 MED ORDER — BENZOCAINE 10 % MT GEL
Freq: Three times a day (TID) | OROMUCOSAL | Status: DC | PRN
  Filled 2022-09-11: qty 9.4

## 2022-09-11 MED ORDER — PANTOPRAZOLE 2 MG/ML SUSPENSION
40.0000 mg | Freq: Every day | ORAL | Status: DC
  Administered 2022-09-11 – 2022-09-14 (×4): 40 mg via ORAL
  Filled 2022-09-11 (×2): qty 20

## 2022-09-11 MED ORDER — LABETALOL HCL 5 MG/ML IV SOLN
10.0000 mg | INTRAVENOUS | Status: DC | PRN
  Administered 2022-09-11 – 2022-09-15 (×11): 10 mg via INTRAVENOUS
  Filled 2022-09-11 (×11): qty 4

## 2022-09-11 MED ORDER — HYDRALAZINE HCL 20 MG/ML IJ SOLN
20.0000 mg | Freq: Four times a day (QID) | INTRAMUSCULAR | Status: DC | PRN
  Administered 2022-09-11 – 2022-09-14 (×8): 20 mg via INTRAVENOUS
  Filled 2022-09-11 (×8): qty 1

## 2022-09-11 MED ORDER — CLONIDINE HCL 0.1 MG/24HR TD PTWK
0.1000 mg | MEDICATED_PATCH | TRANSDERMAL | Status: DC
  Administered 2022-09-11: 0.1 mg via TRANSDERMAL
  Filled 2022-09-11: qty 1

## 2022-09-11 MED ORDER — METRONIDAZOLE 500 MG PO TABS: 500.0000 mg | ORAL_TABLET | Freq: Two times a day (BID) | ORAL | Status: DC

## 2022-09-11 NOTE — Progress Notes (Signed)
PROGRESS NOTE    [name]  DUK:[id number] DOB: 11-02-1946 DOA: 09/02/2022 PCP: Hoyt Koch, MD    Brief Narrative:  [name] is a 76 y.o. female with medical history significant of atrial fibrillation, hypertension, anxiety, depression, hyperlipidemia, PAD, COPD, chronic pain, dysphagia, CKD 4, GERD, hypothyroidism, history of head neck cancer status post laryngectomy, anemia, CVA presenting with ongoing shortness of breath and weakness.  Does not wear O2 at home.  Patient treated with diuresis with worsening renal function started to stabilize now. Blood pressure control was major issue.  She was transferred to stepdown unit and treated with Cleviprex. Also started having bleeding through the stoma on her neck, treated conservatively and stabilized. 9/28, nighttime she refused her amlodipine, blood pressures were elevated and was started back on Cleviprex. Remains challenging because patient declines medications when her family is not here.  She apparently does not take medicine at home that she is supposed to take.    Assessment & Plan:   Acute on chronic diastolic congestive heart failure Essential hypertension with hypertensive emergency Demand ischemia  Very challenging situation with the patient not willing to take medication given by the nursing staff.   We will taper off Cleviprex.   Patient was able to take oral medications including carvedilol, hydralazine, Imdur if she is in good mood.  Amlodipine in the evening. Nephrology to titrate medications. Continue as needed labetalol and hydralazine.  Acute respiratory failure with hypoxemia: Does not wear oxygen at home.  She was on 5 L of oxygen through her tracheostomy stoma.  Clinically stabilizing.  She did have evidence of shortness of breath.  Patient was presumptively treated for pneumonia with IV antibiotics, completed 5 days of therapy. Wean off.  May need oxygen to go home. Mobilize with  PT.  Bleeding through stoma: Mechanical injury with coagulopathy from Eliquis.  Stabilized.  On Eliquis.  No more bleeding. She is scheduled to have tracheal stretching next week at [facility].  Paroxysmal A-fib: Currently sinus rhythm.  Continue carvedilol, Eliquis was resumed.  Peripheral arterial disease on rosuvastatin and cilostazol.  Acute kidney injury on CKD stage IV: Treated conservatively.  stabilizing.  Creatinine 3.99, will recheck tomorrow. Nephrology following.  She does have outpatient nephrology follow-up.  Hypothyroidism: On Synthroid.  Anxiety/depression COPD Head and neck cancer status post laryngectomy Esophageal stricture  Chronic medical issues.  Stable.  Tooth infection: Painful right lower lateral gum.  Will use cefdinir and Flagyl for 7 days, local lidocaine.  DVT prophylaxis: apixaban (ELIQUIS) tablet 2.5 mg Start: 09/09/22 1030 Place and maintain sequential compression device Start: 09/07/22 1154 apixaban (ELIQUIS) tablet 2.5 mg   Code Status: DNR Family Communication: daughter-in-law on the phone. Disposition Plan: Status is: Inpatient Remains inpatient appropriate because: On Cleviprex infusion today.  Blood pressure control still issue.     Consultants:  Critical care Cardiology Nephrology  Procedures:  None  Antimicrobials:  Rocephin azithromycin 9/23--- 9/27   Subjective: Patient seen and examined.  She declined to take nighttime dose of amlodipine and blood pressures more than 180 started back on Cleviprex.  Patient tells me she feels fine.  Denies any complaints.  Daughter-in-law on the FaceTime talking to the patient and trying to convince her to take medications.  Patient tells me she is fine however.  Objective: Vitals:   09/11/22 0800 09/11/22 1128 09/11/22 1141 09/11/22 1200  BP:  (!) 181/80 (!) 198/56 (!) 159/43  Pulse: 74 75  68  Resp: 14 (!) 24  13  Temp: 98.3 F (36.8 C)     TempSrc: Oral     SpO2: 95%   98%   Weight:      Height:        Intake/Output Summary (Last 24 hours) at 09/11/2022 1234 Last data filed at 09/11/2022 1051 Gross per 24 hour  Intake 328.7 ml  Output --  Net 328.7 ml    Filed Weights   09/06/22 0500 09/09/22 0500 09/10/22 0500  Weight: 50.4 kg 49.7 kg 53.4 kg    Examination:  General exam: Appears calm and comfortable, not in any distress. Respiratory system: Bilateral clear.  Some conducted upper airway sounds. Tracheostomy stoma with some dried blood. Cardiovascular system: S1 & S2 heard, irregularly irregular. Gastrointestinal system: Abdomen is nondistended, soft and nontender. No organomegaly or masses felt. Normal bowel sounds heard. Central nervous system: Alert and oriented. No focal neurological deficits. Extremities: Symmetric 5 x 5 power.    Data Reviewed: I have personally reviewed following labs and imaging studies  CBC: Recent Labs  Lab 09/06/22 0508 09/07/22 0850 09/08/22 0509 09/09/22 0317  WBC 9.2 9.9 9.7 8.3  HGB 7.7* 7.1* 7.1* 7.7*  HCT 25.0* 22.2* 22.7* 24.2*  MCV 89.0 87.4 88.3 87.4  PLT 242 255 249 637    Basic Metabolic Panel: Recent Labs  Lab 09/06/22 0508 09/07/22 0440 09/08/22 0509 09/09/22 0317 09/10/22 0755  NA 139 138 137 142 142  K 4.4 3.7 3.2* 3.0* 3.0*  CL 108 106 98 98 101  CO2 18* 21* '27 30 29  '$ GLUCOSE 103* 121* 142* 101* 104*  BUN 65* 64* 54* 46* 44*  CREATININE 4.96*  4.88* 5.09* 4.49* 4.09* 3.99*  CALCIUM 8.5* 8.1* 7.6* 8.1* 8.0*  PHOS 7.6* 5.8*  --   --   --     GFR: Estimated Creatinine Clearance: 9.9 mL/min (A) (by C-G formula based on SCr of 3.99 mg/dL (H)). Liver Function Tests: Recent Labs  Lab 09/06/22 0508 09/07/22 0440  ALBUMIN 2.5* 2.3*    No results for input(s): "LIPASE", "AMYLASE" in the last 168 hours. No results for input(s): "AMMONIA" in the last 168 hours. Coagulation Profile: No results for input(s): "INR", "PROTIME" in the last 168 hours. Cardiac Enzymes: No results  for input(s): "CKTOTAL", "CKMB", "CKMBINDEX", "TROPONINI" in the last 168 hours. BNP (last 3 results) No results for input(s): "PROBNP" in the last 8760 hours. HbA1C: No results for input(s): "HGBA1C" in the last 72 hours. CBG: No results for input(s): "GLUCAP" in the last 168 hours. Lipid Profile: Recent Labs    09/09/22 0317  TRIG 121    Thyroid Function Tests: No results for input(s): "TSH", "T4TOTAL", "FREET4", "T3FREE", "THYROIDAB" in the last 72 hours. Anemia Panel: No results for input(s): "VITAMINB12", "FOLATE", "FERRITIN", "TIBC", "IRON", "RETICCTPCT" in the last 72 hours. Sepsis Labs: Recent Labs  Lab 09/05/22 1216 09/06/22 0508 09/07/22 0440  PROCALCITON 6.40 5.31 3.37     Recent Results (from the past 240 hour(s))  MRSA Next Gen by PCR, Nasal     Status: Abnormal   Collection Time: 09/08/22 12:52 PM   Specimen: Nasal Mucosa; Nasal Swab  Result Value Ref Range Status   MRSA by PCR Next Gen DETECTED (A) NOT DETECTED Final    Comment: CRITICAL RESULT CALLED TO, READ BACK BY AND VERIFIED WITH: Ambrose Finland RN @ 1956 09/08/2022 PER ENGLAD, K (NOTE) The GeneXpert MRSA Assay (FDA approved for NASAL specimens only), is one component of a comprehensive MRSA colonization surveillance program. It  is not intended to diagnose MRSA infection nor to guide or monitor treatment for MRSA infections. Test performance is not FDA approved in patients less than 11 years old. Performed at [facility], [place] [address], [place], [place] [address]          Radiology Studies: No results found.      Scheduled Meds:  acidophilus  1 capsule Oral Daily   amLODipine  10 mg Oral QHS   apixaban  2.5 mg Oral BID   carvedilol  25 mg Oral BID WC   cefdinir  300 mg Oral Daily   Chlorhexidine Gluconate Cloth  6 each Topical Daily   cilostazol  100 mg Oral BID   diazepam  2.5 mg Oral Q12H   DULoxetine  20 mg Oral Daily   feeding supplement  237 mL Oral  BID BM   fluticasone  2 spray Each Nare Daily   hydrALAZINE  100 mg Oral TID   isosorbide mononitrate  60 mg Oral Daily   levothyroxine  50 mcg Oral Q0600   loratadine  10 mg Oral Daily   metroNIDAZOLE  500 mg Oral BID   mirtazapine  15 mg Oral QHS   mupirocin ointment  1 Application Nasal BID   mouth rinse  15 mL Mouth Rinse 4 times per day   pantoprazole  40 mg Oral Daily   rosuvastatin  20 mg Oral QHS   sodium chloride flush  3 mL Intravenous Q12H   Continuous Infusions:  sodium chloride Stopped (09/08/22 1829)     LOS: 8 days    Time spent: 35 minutes    Barb Merino, MD [facility] Pager [phone]

## 2022-09-11 NOTE — Progress Notes (Signed)
[facility] Progress Note   Assessment/ Plan:   Assessment/ Plan: AKI on CKD 5 - b/l creat is 3.2- 3.6 from august 2023, eGFR 12-14 ml/min. Creat here was 3.5 on admission, which is in normal range for her. With diuresis for suspected CHF the CXR changes have improved.  Cr appears to have peaked and plateaued - renal US negative for obstruction - off IVFs, Cr improved HTNsive urgency : initally were high, then got better.  Started on gtt with hypertensive urgency.  Coming down off gtt with taking pills- hopefully we can wean off.  Palliative care has recommended valium which I hope helps her anxiety and may help her BP too.  Swap amlodipine QHS (which she didn't take) with clonidine patch Atrial fib COPD Hx head/ neck cancer / sp laryngectomy w/ stoma Hemoptysis: pulmonary consulted, plan for CT of the neck-- no tracheal injury.  Appears to be laceration on stoma and not coming from lungs. Anemia: add on iron panel, likely will need ESA Dispo: pending, possibly tomorrow  Subjective:    Didn't want to take her PM meds last night so Cleviprex gtt restarted.  D/w pt and DIL this AM- will swap the amlodipine (which she didn't take) for a clonidine patch so if she still declines PO meds she'll have something on board.     Objective:   BP (!) 159/43   Pulse 68   Temp 98.8 F (37.1 C) (Oral)   Resp 13   Ht $R'5\' 3"'Pe$  (1.6 m)   Wt 53.4 kg   SpO2 98%   BMI 20.85 kg/m   Intake/Output Summary (Last 24 hours) at 09/11/2022 1358 Last data filed at 09/11/2022 1051 Gross per 24 hour  Intake 88.7 ml  Output --  Net 88.7 ml   Weight change:   Physical Exam: Gen: sleeping, arousable NECK: + stoma good CVS:RRR Resp: clear JAS:NKNL  Ext: no LE edema  Imaging: No results found.  Labs: BMET Recent Labs  Lab 09/06/22 0508 09/07/22 0440 09/08/22 0509 09/09/22 0317 09/10/22 0755  NA 139 138 137 142 142  K 4.4 3.7 3.2* 3.0* 3.0*  CL 108 106 98 98 101  CO2 18* 21* $Remov'27 30  29  '[id number]$ GLUCOSE 103* 121* 142* 101* 104*  BUN 65* 64* 54* 46* 44*  CREATININE 4.96*  4.88* 5.09* 4.49* 4.09* 3.99*  CALCIUM 8.5* 8.1* 7.6* 8.1* 8.0*  PHOS 7.6* 5.8*  --   --   --    CBC Recent Labs  Lab 09/06/22 0508 09/07/22 0850 09/08/22 0509 09/09/22 0317  WBC 9.2 9.9 9.7 8.3  HGB 7.7* 7.1* 7.1* 7.7*  HCT 25.0* 22.2* 22.7* 24.2*  MCV 89.0 87.4 88.3 87.4  PLT 242 255 249 307    Medications:     acidophilus  1 capsule Oral Daily   amLODipine  10 mg Oral QHS   apixaban  2.5 mg Oral BID   carvedilol  25 mg Oral BID WC   cefdinir  300 mg Oral Daily   Chlorhexidine Gluconate Cloth  6 each Topical Daily   cilostazol  100 mg Oral BID   diazepam  2.5 mg Oral Q12H   DULoxetine  20 mg Oral Daily   feeding supplement  237 mL Oral BID BM   fluticasone  2 spray Each Nare Daily   hydrALAZINE  100 mg Oral TID   isosorbide mononitrate  60 mg Oral Daily   levothyroxine  50 mcg Oral Q0600   loratadine  10 mg Oral  Daily   metroNIDAZOLE  500 mg Oral BID   mirtazapine  15 mg Oral QHS   mupirocin ointment  1 Application Nasal BID   mouth rinse  15 mL Mouth Rinse 4 times per day   pantoprazole  40 mg Oral Daily   rosuvastatin  20 mg Oral QHS   sodium chloride flush  3 mL Intravenous Q12H    Madelon Lips MD 09/11/2022, 1:58 PM

## 2022-09-11 NOTE — Progress Notes (Signed)
       CROSS COVER NOTE  NAME: [name] MRN: [id number] DOB : Nov 15, 1946    Date of Service   09/11/2022   HPI/Events of Note   Notified by RN that patient refused oral medications, including amlodipine. SBP ~180-220's. An increase dose of 10 mg IV labetalol were given, however SBP maintained ~ 200-220's. RN restarted clevidipine gtt.     Interventions/ Plan   Clevidipine restarted       Raenette Rover, DNP, [place]

## 2022-09-11 NOTE — Plan of Care (Signed)
  Problem: Clinical Measurements: Goal: Respiratory complications will improve Outcome: Progressing   Problem: Coping: Goal: Level of anxiety will decrease Outcome: Progressing   Problem: Clinical Measurements: Goal: Ability to maintain clinical measurements within normal limits will improve Outcome: Not Progressing Goal: Cardiovascular complication will be avoided Outcome: Not Progressing

## 2022-09-12 DIAGNOSIS — I5033 Acute on chronic diastolic (congestive) heart failure: Secondary | ICD-10-CM | POA: Diagnosis not present

## 2022-09-12 LAB — BASIC METABOLIC PANEL
Anion gap: 9 (ref 5–15)
BUN: 41 mg/dL — ABNORMAL HIGH (ref 8–23)
CO2: 27 mmol/L (ref 22–32)
Calcium: 7.9 mg/dL — ABNORMAL LOW (ref 8.9–10.3)
Chloride: 104 mmol/L (ref 98–111)
Creatinine, Ser: 4.14 mg/dL — ABNORMAL HIGH (ref 0.44–1.00)
GFR, Estimated: 11 mL/min — ABNORMAL LOW (ref >60)
Glucose, Bld: 111 mg/dL — ABNORMAL HIGH (ref 70–99)
Potassium: 3.1 mmol/L — ABNORMAL LOW (ref 3.5–5.1)
Sodium: 140 mmol/L (ref 135–145)

## 2022-09-12 LAB — IRON AND TIBC
Iron: 32 ug/dL (ref 28–170)
Saturation Ratios: 17 % (ref 10.4–31.8)
TIBC: 192 ug/dL — ABNORMAL LOW (ref 250–450)
UIBC: 160 ug/dL

## 2022-09-12 LAB — CBC WITH DIFFERENTIAL/PLATELET
Abs Immature Granulocytes: 0.47 10*3/uL — ABNORMAL HIGH (ref 0.00–0.07)
Basophils Absolute: 0 10*3/uL (ref 0.0–0.1)
Basophils Relative: 1 %
Eosinophils Absolute: 0.2 10*3/uL (ref 0.0–0.5)
Eosinophils Relative: 2 %
HCT: 21.9 % — ABNORMAL LOW (ref 36.0–46.0)
Hemoglobin: 6.7 g/dL — CL (ref 12.0–15.0)
Immature Granulocytes: 7 %
Lymphocytes Relative: 15 %
Lymphs Abs: 1.1 10*3/uL (ref 0.7–4.0)
MCH: 27.6 pg (ref 26.0–34.0)
MCHC: 30.6 g/dL (ref 30.0–36.0)
MCV: 90.1 fL (ref 80.0–100.0)
Monocytes Absolute: 0.6 10*3/uL (ref 0.1–1.0)
Monocytes Relative: 9 %
Neutro Abs: 4.9 10*3/uL (ref 1.7–7.7)
Neutrophils Relative %: 66 %
Platelets: 303 10*3/uL (ref 150–400)
RBC: 2.43 MIL/uL — ABNORMAL LOW (ref 3.87–5.11)
RDW: 15.5 % (ref 11.5–15.5)
WBC: 7.2 10*3/uL (ref 4.0–10.5)
nRBC: 0 % (ref 0.0–0.2)

## 2022-09-12 LAB — HEMOGLOBIN AND HEMATOCRIT, BLOOD
HCT: 29.9 % — ABNORMAL LOW (ref 36.0–46.0)
Hemoglobin: 9.3 g/dL — ABNORMAL LOW (ref 12.0–15.0)

## 2022-09-12 LAB — MAGNESIUM: Magnesium: 1.6 mg/dL — ABNORMAL LOW (ref 1.7–2.4)

## 2022-09-12 LAB — FERRITIN: Ferritin: 284 ng/mL (ref 11–307)

## 2022-09-12 LAB — PREPARE RBC (CROSSMATCH)

## 2022-09-12 MED ORDER — DARBEPOETIN ALFA 60 MCG/0.3ML IJ SOSY
60.0000 ug | PREFILLED_SYRINGE | INTRAMUSCULAR | Status: DC
  Administered 2022-09-12: 60 ug via SUBCUTANEOUS
  Filled 2022-09-12: qty 0.3

## 2022-09-12 MED ORDER — MAGNESIUM SULFATE 2 GM/50ML IV SOLN
2.0000 g | Freq: Once | INTRAVENOUS | Status: AC
  Administered 2022-09-12: 2 g via INTRAVENOUS
  Filled 2022-09-12: qty 50

## 2022-09-12 MED ORDER — POTASSIUM CHLORIDE 20 MEQ PO PACK
40.0000 meq | PACK | Freq: Once | ORAL | Status: AC
  Administered 2022-09-12: 40 meq via ORAL
  Filled 2022-09-12: qty 2

## 2022-09-12 MED ORDER — POTASSIUM CHLORIDE CRYS ER 20 MEQ PO TBCR
40.0000 meq | EXTENDED_RELEASE_TABLET | Freq: Once | ORAL | Status: DC
  Filled 2022-09-12: qty 2

## 2022-09-12 MED ORDER — POTASSIUM CHLORIDE CRYS ER 20 MEQ PO TBCR
40.0000 meq | EXTENDED_RELEASE_TABLET | Freq: Two times a day (BID) | ORAL | Status: AC
  Administered 2022-09-12 (×2): 40 meq via ORAL
  Filled 2022-09-12 (×2): qty 2

## 2022-09-12 MED ORDER — SODIUM CHLORIDE 0.9% IV SOLUTION: Freq: Once | INTRAVENOUS | Status: AC

## 2022-09-12 NOTE — Progress Notes (Signed)
[facility] Progress Note   Assessment/ Plan:   Assessment/ Plan: AKI on CKD 5 - b/l creat is 3.2- 3.6 from august 2023, eGFR 12-14 ml/min. Creat here was 3.5 on admission, which is in normal range for her. With diuresis for suspected CHF the CXR changes have improved.  Cr appears to have peaked and plateaued - renal US negative for obstruction - off IVFs, Cr plateauing around 3.9-4.1. HTNsive urgency : initally were high, then got better.  Started on gtt with hypertensive urgency.  Coming down off gtt with taking pills- hopefully we can wean off.  Palliative care has recommended valium which I hope helps her anxiety and may help her BP too.  Swap amlodipine QHS (which she didn't take) with clonidine patch.  Overall pressures are better although she did still get some prns yesterday.  Will follow, may need increase in imdur Atrial fib- on Eliquis Hx head/ neck cancer / sp laryngectomy w/ stoma Hemoptysis: pulmonary consulted, plan for CT of the neck-- no tracheal injury.  Appears to be laceration on stoma and not coming from lungs.  On cefdinir and flagyl Anemia: hgb 6.7 this AM, getting 1 u pRBCs, Aranesp 60 mcg ordered. Dispo: pending  Subjective:    BP is getting better with the addition of the gtt.  Still got some prns yesterday.  Now normotensive after AM meds.  Clonidine patch in place.   Hgb 6.7 this AM, getting 1 u pRBCs   Objective:   BP (!) 162/52   Pulse 67   Temp 98.3 F (36.8 C) (Oral)   Resp 15   Ht _0  (1.6 m)   Wt 53.4 kg   SpO2 93%   BMI 20.85 kg/m   Intake/Output Summary (Last 24 hours) at 09/12/2022 1044 Last data filed at 09/12/2022 7711 Gross per 24 hour  Intake 100.86 ml  Output --  Net 100.86 ml   Weight change:   Physical Exam: Gen: sleeping, arousable NECK: + stoma  CVS:RRR Resp: clear AFB:XUXY  Ext: no LE edema  Imaging: No results found.  Labs: BMET Recent Labs  Lab 09/06/22 0508 09/07/22 0440 09/08/22 0509  09/09/22 0317 09/10/22 0755 09/12/22 0324  NA 139 138 137 142 142 140  K 4.4 3.7 3.2* 3.0* 3.0* 3.1*  CL 108 106 98 98 101 104  CO2 18* 21* _1 GLUCOSE 103* 121* 142* 101* 104* 111*  BUN 65* 64* 54* 46* 44* 41*  CREATININE 4.96*  4.88* 5.09* 4.49* 4.09* 3.99* 4.14*  CALCIUM 8.5* 8.1* 7.6* 8.1* 8.0* 7.9*  PHOS 7.6* 5.8*  --   --   --   --    CBC Recent Labs  Lab 09/07/22 0850 09/08/22 0509 09/09/22 0317 09/12/22 0324  WBC 9.9 9.7 8.3 7.2  NEUTROABS  --   --   --  4.9  HGB 7.1* 7.1* 7.7* 6.7*  HCT 22.2* 22.7* 24.2* 21.9*  MCV 87.4 88.3 87.4 90.1  PLT 255 249 307 303    Medications:     sodium chloride   Intravenous Once   acidophilus  1 capsule Oral Daily   apixaban  2.5 mg Oral BID   carvedilol  25 mg Oral BID WC   cefdinir  300 mg Oral Daily   Chlorhexidine Gluconate Cloth  6 each Topical Daily   cilostazol  100 mg Oral BID   cloNIDine  0.1 mg Transdermal Weekly   darbepoetin (ARANESP) injection - NON-DIALYSIS  60 mcg Subcutaneous Q  Sat-1800   diazepam  2.5 mg Oral Q12H   DULoxetine  20 mg Oral Daily   feeding supplement  237 mL Oral BID BM   fluticasone  2 spray Each Nare Daily   hydrALAZINE  100 mg Oral TID   isosorbide mononitrate  60 mg Oral Daily   levothyroxine  50 mcg Oral Q0600   loratadine  10 mg Oral Daily   metroNIDAZOLE  500 mg Oral BID   mirtazapine  15 mg Oral QHS   mupirocin ointment  1 Application Nasal BID   mouth rinse  15 mL Mouth Rinse 4 times per day   pantoprazole  40 mg Oral Daily   rosuvastatin  20 mg Oral QHS   sodium chloride flush  3 mL Intravenous Q12H    Madelon Lips MD 09/12/2022, 10:44 AM

## 2022-09-12 NOTE — Progress Notes (Signed)
PROGRESS NOTE    [name]  WPY:[id number] DOB: [birth year] DOA: 09/02/2022 PCP: Hoyt Koch, MD    Brief Narrative:  [name] is a 76 y.o. female with medical history significant of atrial fibrillation, hypertension, anxiety, depression, hyperlipidemia, PAD, COPD, chronic pain, dysphagia, CKD 4, GERD, hypothyroidism, history of head neck cancer status post laryngectomy, anemia, CVA presenting with ongoing shortness of breath and weakness.  Does not wear O2 at home.  Patient treated with diuresis with worsening renal function that has started to stabilized by now. Blood pressure control was major issue.  She was transferred to stepdown unit and treated with Cleviprex. Also started having bleeding through the stoma on her neck, treated conservatively and stabilized. 9/28, nighttime she refused her amlodipine, blood pressures were elevated and was started back on Cleviprex. Remains challenging because patient declines medications when her family is not here.  She apparently does not take medicine at home that she is supposed to take.    Assessment & Plan:   Acute on chronic diastolic congestive heart failure Essential hypertension with hypertensive emergency Demand ischemia  Very challenging situation with the patient not willing to take medication given by the nursing staff.   Patient was able to take oral medications including carvedilol, hydralazine, Imdur if she is in good mood.   Added clonidine patch 9/29. Nephrology to titrate medications. Continue as needed labetalol and hydralazine. Avoid Cleviprex infusion to prepare for discharge.  Acute respiratory failure with hypoxemia: Does not wear oxygen at home.  She was on 5 L of oxygen through her tracheostomy stoma.  Clinically stabilizing.  She did have evidence of shortness of breath.  Patient was presumptively treated for pneumonia with IV antibiotics, completed 5 days of therapy. Wean off.  May need oxygen  to go home. Mobilize with PT.  Bleeding through stoma: Mechanical injury with coagulopathy from Eliquis.  Stabilized.  On Eliquis.  No more bleeding. She is scheduled to have tracheal stretching next week at [facility].  Paroxysmal A-fib: Currently sinus rhythm.  Continue carvedilol, Eliquis was resumed.  Peripheral arterial disease on rosuvastatin and cilostazol.  Acute kidney injury on CKD stage IV: Treated conservatively.  stabilizing.  Creatinine about 4.  Nephrology following.  She does have outpatient nephrology follow-up.  Anemia of chronic disease: Anemia due to CKD.  Hemoglobin less than 7 today.  Consented for 1 unit PRBC.  Hypothyroidism: On Synthroid.  Anxiety/depression COPD Head and neck cancer status post laryngectomy Esophageal stricture  Chronic medical issues.  Stable.  Tooth infection: Painful right lower lateral gum.  Will use cefdinir and Flagyl for 7 days, local lidocaine.  DVT prophylaxis: apixaban (ELIQUIS) tablet 2.5 mg Start: 09/09/22 1030 Place and maintain sequential compression device Start: 09/07/22 1154 apixaban (ELIQUIS) tablet 2.5 mg   Code Status: DNR Family Communication: daughter-in-law on the phone. Disposition Plan: Status is: Inpatient Remains inpatient appropriate because: Blood transfusions as needed.  Blood pressure control still issue.     Consultants:  Critical care Cardiology Nephrology  Procedures:  None  Antimicrobials:  Rocephin azithromycin 9/23--- 9/27   Subjective: Patient seen and examined.  No overnight events.  Today she was comfortable and was able to tell me that she feels fine.  Nursing reported that she ate her meal all day yesterday.  Daughter-in-law on the phone.  Patient took her medications.  Consented for blood transfusion.  Still on oxygen through the tracheostomy. Mobilize and wean off oxygen and anticipate home tomorrow and family agrees. Patient  requested to talk to daughter-in-law Ms. Janett Billow and  used her for translation.  Objective: Vitals:   09/12/22 0900 09/12/22 0907 09/12/22 1000 09/12/22 1100  BP: (!) 245/72  (!) 162/45 (!) 145/44  Pulse: 83  69 71  Resp: '18  15 16  '$ Temp:  98.3 F (36.8 C)    TempSrc:  Oral    SpO2: 95%  96% 96%  Weight:      Height:        Intake/Output Summary (Last 24 hours) at 09/12/2022 1125 Last data filed at 09/12/2022 0653 Gross per 24 hour  Intake 50 ml  Output --  Net 50 ml   Filed Weights   09/06/22 0500 09/09/22 0500 09/10/22 0500  Weight: 50.4 kg 49.7 kg 53.4 kg    Examination:  General exam: Appears calm and comfortable, not in any distress. She does not have voice because of tracheostomy. Respiratory system: Bilateral clear.  Some conducted upper airway sounds. Tracheostomy stoma with some dried blood. Cardiovascular system: S1 & S2 heard, irregularly irregular. Gastrointestinal system: Abdomen is nondistended, soft and nontender. No organomegaly or masses felt. Normal bowel sounds heard. Central nervous system: Alert and oriented. No focal neurological deficits. Extremities: Symmetric 5 x 5 power.    Data Reviewed: I have personally reviewed following labs and imaging studies  CBC: Recent Labs  Lab 09/06/22 0508 09/07/22 0850 09/08/22 0509 09/09/22 0317 09/12/22 0324  WBC 9.2 9.9 9.7 8.3 7.2  NEUTROABS  --   --   --   --  4.9  HGB 7.7* 7.1* 7.1* 7.7* 6.7*  HCT 25.0* 22.2* 22.7* 24.2* 21.9*  MCV 89.0 87.4 88.3 87.4 90.1  PLT 242 255 249 307 962   Basic Metabolic Panel: Recent Labs  Lab 09/06/22 0508 09/07/22 0440 09/08/22 0509 09/09/22 0317 09/10/22 0755 09/12/22 0324  NA 139 138 137 142 142 140  K 4.4 3.7 3.2* 3.0* 3.0* 3.1*  CL 108 106 98 98 101 104  CO2 18* 21* '27 30 29 27  '$ GLUCOSE 103* 121* 142* 101* 104* 111*  BUN 65* 64* 54* 46* 44* 41*  CREATININE 4.96*  4.88* 5.09* 4.49* 4.09* 3.99* 4.14*  CALCIUM 8.5* 8.1* 7.6* 8.1* 8.0* 7.9*  MG  --   --   --   --   --  1.6*  PHOS 7.6* 5.8*  --   --   --    --    GFR: Estimated Creatinine Clearance: 9.6 mL/min (A) (by C-G formula based on SCr of 4.14 mg/dL (H)). Liver Function Tests: Recent Labs  Lab 09/06/22 0508 09/07/22 0440  ALBUMIN 2.5* 2.3*   No results for input(s): "LIPASE", "AMYLASE" in the last 168 hours. No results for input(s): "AMMONIA" in the last 168 hours. Coagulation Profile: No results for input(s): "INR", "PROTIME" in the last 168 hours. Cardiac Enzymes: No results for input(s): "CKTOTAL", "CKMB", "CKMBINDEX", "TROPONINI" in the last 168 hours. BNP (last 3 results) No results for input(s): "PROBNP" in the last 8760 hours. HbA1C: No results for input(s): "HGBA1C" in the last 72 hours. CBG: No results for input(s): "GLUCAP" in the last 168 hours. Lipid Profile: No results for input(s): "CHOL", "HDL", "LDLCALC", "TRIG", "CHOLHDL", "LDLDIRECT" in the last 72 hours.  Thyroid Function Tests: No results for input(s): "TSH", "T4TOTAL", "FREET4", "T3FREE", "THYROIDAB" in the last 72 hours. Anemia Panel: No results for input(s): "VITAMINB12", "FOLATE", "FERRITIN", "TIBC", "IRON", "RETICCTPCT" in the last 72 hours. Sepsis Labs: Recent Labs  Lab 09/05/22 1216 09/06/22 0508 09/07/22 0440  PROCALCITON 6.40 5.31 3.37    Recent Results (from the past 240 hour(s))  MRSA Next Gen by PCR, Nasal     Status: Abnormal   Collection Time: 09/08/22 12:52 PM   Specimen: Nasal Mucosa; Nasal Swab  Result Value Ref Range Status   MRSA by PCR Next Gen DETECTED (A) NOT DETECTED Final    Comment: CRITICAL RESULT CALLED TO, READ BACK BY AND VERIFIED WITH: Ambrose Finland RN @ 1956 09/08/2022 PER ENGLAD, K (NOTE) The GeneXpert MRSA Assay (FDA approved for NASAL specimens only), is one component of a comprehensive MRSA colonization surveillance program. It is not intended to diagnose MRSA infection nor to guide or monitor treatment for MRSA infections. Test performance is not FDA approved in patients less than 23  years old. Performed at [facility], [place] [address], [place], [place] [address]          Radiology Studies: No results found.      Scheduled Meds:  sodium chloride   Intravenous Once   acidophilus  1 capsule Oral Daily   apixaban  2.5 mg Oral BID   carvedilol  25 mg Oral BID WC   cefdinir  300 mg Oral Daily   Chlorhexidine Gluconate Cloth  6 each Topical Daily   cilostazol  100 mg Oral BID   cloNIDine  0.1 mg Transdermal Weekly   darbepoetin (ARANESP) injection - NON-DIALYSIS  60 mcg Subcutaneous Q Sat-1800   diazepam  2.5 mg Oral Q12H   DULoxetine  20 mg Oral Daily   feeding supplement  237 mL Oral BID BM   fluticasone  2 spray Each Nare Daily   hydrALAZINE  100 mg Oral TID   isosorbide mononitrate  60 mg Oral Daily   levothyroxine  50 mcg Oral Q0600   loratadine  10 mg Oral Daily   metroNIDAZOLE  500 mg Oral BID   mirtazapine  15 mg Oral QHS   mupirocin ointment  1 Application Nasal BID   mouth rinse  15 mL Mouth Rinse 4 times per day   pantoprazole  40 mg Oral Daily   potassium chloride  40 mEq Oral BID   rosuvastatin  20 mg Oral QHS   sodium chloride flush  3 mL Intravenous Q12H   Continuous Infusions:  sodium chloride Stopped (09/08/22 1829)     LOS: 9 days    Time spent: 35 minutes    Barb Merino, MD [facility] Pager [phone]

## 2022-09-13 DIAGNOSIS — I5033 Acute on chronic diastolic (congestive) heart failure: Secondary | ICD-10-CM | POA: Diagnosis not present

## 2022-09-13 LAB — CBC WITH DIFFERENTIAL/PLATELET
Abs Immature Granulocytes: 0.53 10*3/uL — ABNORMAL HIGH (ref 0.00–0.07)
Basophils Absolute: 0.1 10*3/uL (ref 0.0–0.1)
Basophils Relative: 1 %
Eosinophils Absolute: 0.1 10*3/uL (ref 0.0–0.5)
Eosinophils Relative: 2 %
HCT: 28.7 % — ABNORMAL LOW (ref 36.0–46.0)
Hemoglobin: 8.7 g/dL — ABNORMAL LOW (ref 12.0–15.0)
Immature Granulocytes: 7 %
Lymphocytes Relative: 16 %
Lymphs Abs: 1.3 10*3/uL (ref 0.7–4.0)
MCH: 27.1 pg (ref 26.0–34.0)
MCHC: 30.3 g/dL (ref 30.0–36.0)
MCV: 89.4 fL (ref 80.0–100.0)
Monocytes Absolute: 0.6 10*3/uL (ref 0.1–1.0)
Monocytes Relative: 7 %
Neutro Abs: 5.2 10*3/uL (ref 1.7–7.7)
Neutrophils Relative %: 67 %
Platelets: 316 10*3/uL (ref 150–400)
RBC: 3.21 MIL/uL — ABNORMAL LOW (ref 3.87–5.11)
RDW: 15.8 % — ABNORMAL HIGH (ref 11.5–15.5)
WBC: 7.8 10*3/uL (ref 4.0–10.5)
nRBC: 0 % (ref 0.0–0.2)

## 2022-09-13 LAB — BASIC METABOLIC PANEL
Anion gap: 8 (ref 5–15)
BUN: 45 mg/dL — ABNORMAL HIGH (ref 8–23)
CO2: 27 mmol/L (ref 22–32)
Calcium: 8.7 mg/dL — ABNORMAL LOW (ref 8.9–10.3)
Chloride: 108 mmol/L (ref 98–111)
Creatinine, Ser: 4 mg/dL — ABNORMAL HIGH (ref 0.44–1.00)
GFR, Estimated: 11 mL/min — ABNORMAL LOW (ref >60)
Glucose, Bld: 102 mg/dL — ABNORMAL HIGH (ref 70–99)
Potassium: 4.6 mmol/L (ref 3.5–5.1)
Sodium: 143 mmol/L (ref 135–145)

## 2022-09-13 MED ORDER — OXYCODONE HCL 5 MG PO TABS
5.0000 mg | ORAL_TABLET | Freq: Four times a day (QID) | ORAL | Status: DC
  Administered 2022-09-13 – 2022-09-16 (×12): 5 mg via ORAL
  Filled 2022-09-13 (×14): qty 1

## 2022-09-13 MED ORDER — HYDROCORTISONE 1 % EX CREA: 1.0000 | TOPICAL_CREAM | Freq: Three times a day (TID) | CUTANEOUS | Status: DC | PRN

## 2022-09-13 MED ORDER — ISOSORBIDE MONONITRATE ER 60 MG PO TB24
60.0000 mg | ORAL_TABLET | Freq: Two times a day (BID) | ORAL | Status: DC
  Administered 2022-09-13 – 2022-09-17 (×9): 60 mg via ORAL
  Filled 2022-09-13 (×9): qty 1

## 2022-09-13 NOTE — Progress Notes (Signed)
[facility] Progress Note   Assessment/ Plan:   Assessment/ Plan: AKI on CKD 5 - b/l creat is 3.2- 3.6 from august 2023, eGFR 12-14 ml/min. Creat here was 3.5 on admission, which is in normal range for her. With diuresis for suspected CHF the CXR changes have improved.  Cr appears to have peaked and plateaued - renal US negative for obstruction - off IVFs, Cr plateauing around 3.9-4.1. HTNsive urgency : initally were high, then got better.  Started on gtt with hypertensive urgency.  Coming down off gtt with taking pills- hopefully we can wean off.  Palliative care has recommended valium which I hope helps her anxiety and may help her BP too.  Swap amlodipine QHS (which she didn't take) with clonidine patch.  Overall pressures are better although she is still getting some prns.  Increase imdur to 60 mg BID Atrial fib- on Eliquis Hx head/ neck cancer / sp laryngectomy w/ stoma Hemoptysis: pulmonary consulted, plan for CT of the neck-- no tracheal injury.  Appears to be laceration on stoma and not coming from lungs.  On cefdinir and flagyl Anemia: hgb 6.7 this AM, getting 1 u pRBCs, Aranesp 60 mcg ordered. Dispo: pending  Subjective:    Got 1 u pRBCs yesterday, feeling better.  Hgb appropriately up too.  ESA given as well.  Cr is stable.  BP still up and down but appears to be better.  Increasing imdur to 60 mg BID- I think she needs something in the evening to what she already takes to bring BP down so hopefully this will decrease need for prn   Objective:   BP (!) 250/79 (BP Location: Left Arm)   Pulse 70   Temp 98.8 F (37.1 C) (Oral)   Resp (!) 21   Ht _0  (1.6 m)   Wt 52.8 kg   SpO2 96%   BMI 20.62 kg/m   Intake/Output Summary (Last 24 hours) at 09/13/2022 1112 Last data filed at 09/12/2022 1605 Gross per 24 hour  Intake 315 ml  Output --  Net 315 ml   Weight change:   Physical Exam: Gen: sleeping, arousable NECK: + stoma  CVS:RRR Resp: clear VQQ:VZDG   Ext: no LE edema  Imaging: No results found.  Labs: BMET Recent Labs  Lab 09/07/22 0440 09/08/22 0509 09/09/22 0317 09/10/22 0755 09/12/22 0324 09/13/22 0353  NA 138 137 142 142 140 143  K 3.7 3.2* 3.0* 3.0* 3.1* 4.6  CL 106 98 98 101 104 108  CO2 21* _1 GLUCOSE 121* 142* 101* 104* 111* 102*  BUN 64* 54* 46* 44* 41* 45*  CREATININE 5.09* 4.49* 4.09* 3.99* 4.14* 4.00*  CALCIUM 8.1* 7.6* 8.1* 8.0* 7.9* 8.7*  PHOS 5.8*  --   --   --   --   --    CBC Recent Labs  Lab 09/08/22 0509 09/09/22 0317 09/12/22 0324 09/12/22 1827 09/13/22 0353  WBC 9.7 8.3 7.2  --  7.8  NEUTROABS  --   --  4.9  --  5.2  HGB 7.1* 7.7* 6.7* 9.3* 8.7*  HCT 22.7* 24.2* 21.9* 29.9* 28.7*  MCV 88.3 87.4 90.1  --  89.4  PLT 249 307 303  --  316    Medications:     acidophilus  1 capsule Oral Daily   apixaban  2.5 mg Oral BID   carvedilol  25 mg Oral BID WC   cefdinir  300 mg Oral Daily  Chlorhexidine Gluconate Cloth  6 each Topical Daily   cilostazol  100 mg Oral BID   cloNIDine  0.1 mg Transdermal Weekly   darbepoetin (ARANESP) injection - NON-DIALYSIS  60 mcg Subcutaneous Q Sat-1800   diazepam  2.5 mg Oral Q12H   DULoxetine  20 mg Oral Daily   feeding supplement  237 mL Oral BID BM   fluticasone  2 spray Each Nare Daily   hydrALAZINE  100 mg Oral TID   isosorbide mononitrate  60 mg Oral BID   levothyroxine  50 mcg Oral Q0600   loratadine  10 mg Oral Daily   metroNIDAZOLE  500 mg Oral BID   mirtazapine  15 mg Oral QHS   mouth rinse  15 mL Mouth Rinse 4 times per day   oxyCODONE  5 mg Oral Q6H   pantoprazole  40 mg Oral Daily   rosuvastatin  20 mg Oral QHS   sodium chloride flush  3 mL Intravenous Q12H    Madelon Lips MD 09/13/2022, 11:12 AM

## 2022-09-13 NOTE — Progress Notes (Signed)
PROGRESS NOTE    [name]  VPX:[id number] DOB: August 08, 1946 DOA: 09/02/2022 PCP: Hoyt Koch, MD    Brief Narrative:  [name] is a 76 y.o. female with medical history significant of atrial fibrillation, hypertension, anxiety, depression, hyperlipidemia, PAD, COPD, chronic pain, dysphagia, CKD 4, GERD, hypothyroidism, history of head neck cancer status post laryngectomy, anemia, CVA presenting with ongoing shortness of breath and weakness.  Does not wear O2 at home.  Patient treated with diuresis with worsening renal function that has started to stabilized by now. Blood pressure control was major issue.  She was transferred to stepdown unit and treated with Cleviprex. Also started having bleeding through the stoma on her neck, treated conservatively and stabilized. 9/28, nighttime she refused her amlodipine, blood pressures were elevated and was started back on Cleviprex. 10/1, clinically improving.  Her systolic blood pressure was 256 on my morning rounds.    Assessment & Plan:   Acute on chronic diastolic congestive heart failure Essential hypertension with hypertensive emergency Demand ischemia  Very challenging situation with the patient sometimes not willing to take medication and blood pressure remaining very high.  Currently on carvedilol, clonidine 0.1 mg patch, hydralazine 100 mg 3 times daily, Imdur 60 mg twice daily and increase dose today.  As needed medications. Receiving Valium for anxiety.  Will add some scheduled pain medication to see if that helps with blood pressure control. Nephrology to titrate medications. Continue as needed labetalol and hydralazine.  Acute respiratory failure with hypoxemia: Does not wear oxygen at home.  She was on 5 L of oxygen through her tracheostomy stoma.  Clinically stabilizing.  She did have evidence of shortness of breath.  Patient was presumptively treated for pneumonia with IV antibiotics, completed 5 days of  therapy. Wean off.  May need oxygen to go home. Mobilize with PT.  Bleeding through stoma: Mechanical injury with coagulopathy from Eliquis.  Stabilized.  On Eliquis.  No more bleeding. She is scheduled to have tracheal stretching next week at [facility].  Paroxysmal A-fib: Currently sinus rhythm.  Continue carvedilol, Eliquis was resumed.  Peripheral arterial disease on rosuvastatin and cilostazol.  Acute kidney injury on CKD stage IV: Treated conservatively.  stabilizing.  Creatinine about 4.  Nephrology following.   Anemia of chronic disease: Anemia due to CKD.  Hemoglobin less than 7 9/30, 1 unit of PRBC and 1 unit of ESA with good improvement.  Hypothyroidism: On Synthroid.  Anxiety/depression COPD Head and neck cancer status post laryngectomy Esophageal stricture  Chronic medical issues.  Stable.  Tooth infection: Painful right lower lateral gum.  Will use cefdinir and Flagyl for 7 days, local lidocaine.  DVT prophylaxis: apixaban (ELIQUIS) tablet 2.5 mg Start: 09/09/22 1030 Place and maintain sequential compression device Start: 09/07/22 1154 apixaban (ELIQUIS) tablet 2.5 mg   Code Status: DNR Family Communication: daughter-in-law on the phone. Disposition Plan: Status is: Inpatient Remains inpatient appropriate because: Blood transfusions as needed.  Blood pressure control still issue.     Consultants:  Critical care Cardiology Nephrology  Procedures:  None  Antimicrobials:  Rocephin azithromycin 9/23--- 9/27 Omnicef and Flagyl 9/29---   Subjective: Patient seen and examined.  Was trying to eat some meal.  She still has pain on her right jaw with her right side of the gum hurting.  Breathing is stable.  Patient denies any difficulty breathing or shortness of breath.  Stoma is dry and no more bleeding. Patient denied any headache or chest pain today. Repeatedly checked her  blood pressures at 8 AM on my morning rounds, we found her blood pressure to be more  than 270 systolic.  Objective: Vitals:   09/13/22 0843 09/13/22 1100 09/13/22 1200 09/13/22 1218  BP:  (!) 194/59 (!) 185/74   Pulse:  70 77   Resp:  13 12   Temp: 98.8 F (37.1 C)   98.2 F (36.8 C)  TempSrc: Oral   Oral  SpO2:  97% 96%   Weight:      Height:        Intake/Output Summary (Last 24 hours) at 09/13/2022 1259 Last data filed at 09/13/2022 0900 Gross per 24 hour  Intake 555 ml  Output --  Net 555 ml   Filed Weights   09/09/22 0500 09/10/22 0500 09/13/22 0550  Weight: 49.7 kg 53.4 kg 52.8 kg    Examination:  General exam: Appears calm and comfortable, not in any distress.  Appropriately anxious. She does not have voice because of tracheostomy. Respiratory system: Bilateral clear.  No added sounds. Tracheostomy stoma is clean and dry. Cardiovascular system: S1 & S2 heard, irregularly irregular. Gastrointestinal system: Abdomen is nondistended, soft and nontender. No organomegaly or masses felt. Normal bowel sounds heard. Central nervous system: Alert and oriented. No focal neurological deficits. Extremities: Symmetric 5 x 5 power.    Data Reviewed: I have personally reviewed following labs and imaging studies  CBC: Recent Labs  Lab 09/07/22 0850 09/08/22 0509 09/09/22 0317 09/12/22 0324 09/12/22 1827 09/13/22 0353  WBC 9.9 9.7 8.3 7.2  --  7.8  NEUTROABS  --   --   --  4.9  --  5.2  HGB 7.1* 7.1* 7.7* 6.7* 9.3* 8.7*  HCT 22.2* 22.7* 24.2* 21.9* 29.9* 28.7*  MCV 87.4 88.3 87.4 90.1  --  89.4  PLT 255 249 307 303  --  623   Basic Metabolic Panel: Recent Labs  Lab 09/07/22 0440 09/08/22 0509 09/09/22 0317 09/10/22 0755 09/12/22 0324 09/13/22 0353  NA 138 137 142 142 140 143  K 3.7 3.2* 3.0* 3.0* 3.1* 4.6  CL 106 98 98 101 104 108  CO2 21* '27 30 29 27 27  '$ GLUCOSE 121* 142* 101* 104* 111* 102*  BUN 64* 54* 46* 44* 41* 45*  CREATININE 5.09* 4.49* 4.09* 3.99* 4.14* 4.00*  CALCIUM 8.1* 7.6* 8.1* 8.0* 7.9* 8.7*  MG  --   --   --   --  1.6*   --   PHOS 5.8*  --   --   --   --   --    GFR: Estimated Creatinine Clearance: 9.9 mL/min (A) (by C-G formula based on SCr of 4 mg/dL (H)). Liver Function Tests: Recent Labs  Lab 09/07/22 0440  ALBUMIN 2.3*   No results for input(s): "LIPASE", "AMYLASE" in the last 168 hours. No results for input(s): "AMMONIA" in the last 168 hours. Coagulation Profile: No results for input(s): "INR", "PROTIME" in the last 168 hours. Cardiac Enzymes: No results for input(s): "CKTOTAL", "CKMB", "CKMBINDEX", "TROPONINI" in the last 168 hours. BNP (last 3 results) No results for input(s): "PROBNP" in the last 8760 hours. HbA1C: No results for input(s): "HGBA1C" in the last 72 hours. CBG: No results for input(s): "GLUCAP" in the last 168 hours. Lipid Profile: No results for input(s): "CHOL", "HDL", "LDLCALC", "TRIG", "CHOLHDL", "LDLDIRECT" in the last 72 hours.  Thyroid Function Tests: No results for input(s): "TSH", "T4TOTAL", "FREET4", "T3FREE", "THYROIDAB" in the last 72 hours. Anemia Panel: Recent Labs    09/12/22  0809  FERRITIN 284  TIBC 192*  IRON 32   Sepsis Labs: Recent Labs  Lab 09/07/22 0440  PROCALCITON 3.37    Recent Results (from the past 240 hour(s))  MRSA Next Gen by PCR, Nasal     Status: Abnormal   Collection Time: 09/08/22 12:52 PM   Specimen: Nasal Mucosa; Nasal Swab  Result Value Ref Range Status   MRSA by PCR Next Gen DETECTED (A) NOT DETECTED Final    Comment: CRITICAL RESULT CALLED TO, READ BACK BY AND VERIFIED WITH: Ambrose Finland RN @ 1956 09/08/2022 PER ENGLAD, K (NOTE) The GeneXpert MRSA Assay (FDA approved for NASAL specimens only), is one component of a comprehensive MRSA colonization surveillance program. It is not intended to diagnose MRSA infection nor to guide or monitor treatment for MRSA infections. Test performance is not FDA approved in patients less than 97 years old. Performed at [facility], [place] [address], [place], [place] [address]          Radiology Studies: No results found.      Scheduled Meds:  acidophilus  1 capsule Oral Daily   apixaban  2.5 mg Oral BID   carvedilol  25 mg Oral BID WC   cefdinir  300 mg Oral Daily   Chlorhexidine Gluconate Cloth  6 each Topical Daily   cilostazol  100 mg Oral BID   cloNIDine  0.1 mg Transdermal Weekly   darbepoetin (ARANESP) injection - NON-DIALYSIS  60 mcg Subcutaneous Q Sat-1800   diazepam  2.5 mg Oral Q12H   DULoxetine  20 mg Oral Daily   feeding supplement  237 mL Oral BID BM   fluticasone  2 spray Each Nare Daily   hydrALAZINE  100 mg Oral TID   isosorbide mononitrate  60 mg Oral BID   levothyroxine  50 mcg Oral Q0600   loratadine  10 mg Oral Daily   metroNIDAZOLE  500 mg Oral BID   mirtazapine  15 mg Oral QHS   mouth rinse  15 mL Mouth Rinse 4 times per day   oxyCODONE  5 mg Oral Q6H   pantoprazole  40 mg Oral Daily   rosuvastatin  20 mg Oral QHS   sodium chloride flush  3 mL Intravenous Q12H   Continuous Infusions:  sodium chloride Stopped (09/08/22 1829)     LOS: 10 days    Time spent: 35 minutes    Barb Merino, MD [facility] Pager [phone]

## 2022-09-14 DIAGNOSIS — I5033 Acute on chronic diastolic (congestive) heart failure: Secondary | ICD-10-CM | POA: Diagnosis not present

## 2022-09-14 LAB — TYPE AND SCREEN
ABO/RH(D): O POS
Antibody Screen: NEGATIVE
Unit division: 0

## 2022-09-14 LAB — BPAM RBC
Blood Product Expiration Date: 202311032359
ISSUE DATE / TIME: 202309301254
Unit Type and Rh: 5100

## 2022-09-14 MED ORDER — PANTOPRAZOLE SODIUM 40 MG PO TBEC
40.0000 mg | DELAYED_RELEASE_TABLET | Freq: Every day | ORAL | Status: DC
  Administered 2022-09-15 – 2022-09-17 (×3): 40 mg via ORAL
  Filled 2022-09-14 (×3): qty 1

## 2022-09-14 NOTE — Progress Notes (Signed)
PT Cancellation Note  Patient Details Name: [name] MRN: [id number] DOB: Feb 19, 1946   Cancelled Treatment:    Reason Eval/Treat Not Completed: Attempted PT eval-RN requested PT check back later. Will check back as schedule allows. Thanks.    [facility]  Office: [phone] Pager: [phone]

## 2022-09-14 NOTE — Progress Notes (Signed)
Physical Therapy Treatment Patient Details Name: [name] MRN: [id number] DOB: 10-08-1946 Today's Date: 09/14/2022   History of Present Illness 76 y.o. female with medical history significant of atrial fibrillation, hypertension, anxiety, depression, hyperlipidemia, PAD, COPD, chronic pain, dysphagia, CKD 4, GERD, hypothyroidism, history of head neck cancer status post laryngectomy, anemia, CVA presenting with ongoing shortness of breath and weakness.  Does not wear O2 at home.   Patient treated with diuresis with worsening renal function    PT Comments    Agreeable to participate with therapy. Pt tolerated distance well. O2 89-91% on RA during session. Will continue to follow..    Recommendations for follow up therapy are one component of a multi-disciplinary discharge planning process, led by the attending physician.  Recommendations may be updated based on patient status, additional functional criteria and insurance authorization.  Follow Up Recommendations  Home health PT     Assistance Recommended at Discharge Intermittent Supervision/Assistance  Patient can return home with the following     Equipment Recommendations  None recommended by PT    Recommendations for Other Services       Precautions / Restrictions Precautions Precautions: Fall Precaution Comments: monitor O2 (not on O2 at baseline), has trach Restrictions Weight Bearing Restrictions: No     Mobility  Bed Mobility               General bed mobility comments: oob in recliner    Transfers Overall transfer level: Needs assistance   Transfers: Sit to/from Stand Sit to Stand: Supervision           General transfer comment: supv for safety, lines    Ambulation/Gait Ambulation/Gait assistance: Min guard Gait Distance (Feet): 225 Feet Assistive device: None Gait Pattern/deviations: Step-through pattern       General Gait Details: Min guard for safety. Intermittent stumbles but no  overt LOB. O2 89-91% on RA. Pt tolerated distance well.   Stairs             Wheelchair Mobility    Modified Rankin (Stroke Patients Only)       Balance Overall balance assessment: Mild deficits observed, not formally tested                                          Cognition Arousal/Alertness: Awake/alert Behavior During Therapy: WFL for tasks assessed/performed Overall Cognitive Status: Within Functional Limits for tasks assessed                                          Exercises      General Comments        Pertinent Vitals/Pain Pain Assessment Pain Assessment: No/denies pain    Home Living                          Prior Function            PT Goals (current goals can now be found in the care plan section) Progress towards PT goals: Progressing toward goals    Frequency    Min 3X/week      PT Plan Current plan remains appropriate    Co-evaluation              AM-PAC PT "6 Clicks" Mobility  Outcome Measure  Help needed turning from your back to your side while in a flat bed without using bedrails?: None Help needed moving from lying on your back to sitting on the side of a flat bed without using bedrails?: None Help needed moving to and from a bed to a chair (including a wheelchair)?: None Help needed standing up from a chair using your arms (e.g., wheelchair or bedside chair)?: None Help needed to walk in hospital room?: A Little Help needed climbing 3-5 steps with a railing? : A Little 6 Click Score: 22    End of Session Equipment Utilized During Treatment: Gait belt Activity Tolerance: Patient tolerated treatment well Patient left: in chair;with call bell/phone within reach;with nursing/sitter in room   PT Visit Diagnosis: Difficulty in walking, not elsewhere classified (R26.2)     Time: 3151-7616 PT Time Calculation (min) (ACUTE ONLY): 12 min  Charges:  $Gait Training: 8-22  mins                        Doreatha Massed, PT [facility]  Office: [phone] Pager: [phone]

## 2022-09-14 NOTE — Progress Notes (Signed)
PROGRESS NOTE    [name]  EXB:[id number] DOB: 08/11/1946 DOA: 09/02/2022 PCP: Hoyt Koch, MD    Brief Narrative:  [name] is a 76 y.o. female with medical history significant of atrial fibrillation, hypertension, anxiety, depression, hyperlipidemia, PAD, COPD, chronic pain, dysphagia, CKD 4, GERD, hypothyroidism, history of head neck cancer status post laryngectomy, anemia, CVA presenting with ongoing shortness of breath and weakness.   Patient treated with diuresis with worsening renal function that has started to stabilized by now. Blood pressure control was major issue.  She was transferred to stepdown unit and treated with Cleviprex. Also started having bleeding through the stoma on her neck, treated conservatively and stabilized.  10/1, clinically improving. Blood pressure control remains major issue . Systolic BP 10-272.    Assessment & Plan:   Acute on chronic diastolic congestive heart failure Essential hypertension with hypertensive emergency Demand ischemia  Very challenging situation with wide fluctuation of BP.   Currently on carvedilol, clonidine 0.1 mg patch, hydralazine 100 mg 3 times daily, Imdur 60 mg twice daily. Some better today.  Receiving Valium for anxiety.  Will add some scheduled pain medication to see if that helps with blood pressure control. Nephrology to titrate medications. Continue as needed labetalol and hydralazine.  Acute respiratory failure with hypoxemia: Does not wear oxygen at home. Now on room air and humidifier.  Patient was presumptively treated for pneumonia with IV antibiotics, completed 5 days of therapy. Wean off.  Mobilize with PT.  Bleeding through stoma: Mechanical injury with coagulopathy from Eliquis.  Stabilized.  On Eliquis.  No more bleeding. She is scheduled to have tracheal stretching next week at [facility].  Paroxysmal A-fib: Currently sinus rhythm.  Continue carvedilol, Eliquis was  resumed.  Peripheral arterial disease on rosuvastatin and cilostazol.  Acute kidney injury on CKD stage IV: Treated conservatively.  stabilizing.  Creatinine about 4.  Nephrology following.   Anemia of chronic disease: Anemia due to CKD.  Hemoglobin less than 7 9/30, 1 unit of PRBC and 1 unit of ESA with good improvement.  Hypothyroidism: On Synthroid.  Anxiety/depression COPD Head and neck cancer status post laryngectomy Esophageal stricture  Chronic medical issues.  Stable.  Tooth infection: Painful right lower lateral gum.  Will use cefdinir and Flagyl for 7 days, local lidocaine.  DVT prophylaxis: apixaban (ELIQUIS) tablet 2.5 mg Start: 09/09/22 1030 Place and maintain sequential compression device Start: 09/07/22 1154 apixaban (ELIQUIS) tablet 2.5 mg   Code Status: DNR Family Communication: daughter-in-law on the phone. Disposition Plan: Status is: Inpatient Remains inpatient appropriate because: Blood transfusions as needed.  Blood pressure control still issue.  Can transfer to progressive care today and monitor    Consultants:  Critical care Cardiology Nephrology  Procedures:  None  Antimicrobials:  Rocephin azithromycin 9/23--- 9/27 Omnicef and Flagyl 9/29---   Subjective: Patient seen and examined. Patient herself denies any complaints.  Pain is better today. BP 536-64 systolic.  Breathing is stable.  Patient denies any difficulty breathing or shortness of breath.  Stoma is dry and no more bleeding. Patient denied any headache or chest pain today. Early morning BP 254?   Objective: Vitals:   09/14/22 1000 09/14/22 1005 09/14/22 1100 09/14/22 1200  BP: (!) 151/41  (!) 107/32 (!) 132/42  Pulse: 79 78 69 78  Resp: '13 17 12 15  '$ Temp:      TempSrc:      SpO2: (!) 89% 96% 94% 99%  Weight:  Height:        Intake/Output Summary (Last 24 hours) at 09/14/2022 1356 Last data filed at 09/14/2022 1200 Gross per 24 hour  Intake 720 ml  Output 450 ml   Net 270 ml    Filed Weights   09/09/22 0500 09/10/22 0500 09/13/22 0550  Weight: 49.7 kg 53.4 kg 52.8 kg    Examination:  General exam: Appears calm and comfortable, not in any distress.  Comfortable and appropriately responding to queries today.  Respiratory system: Bilateral clear.  No added sounds. Tracheostomy stoma is clean and dry. Just on humidifier. Cardiovascular system: S1 & S2 heard, irregularly irregular. Gastrointestinal system: Abdomen is nondistended, soft and nontender. No organomegaly or masses felt. Normal bowel sounds heard. Central nervous system: Alert and oriented. No focal neurological deficits. Extremities: Symmetric 5 x 5 power.    Data Reviewed: I have personally reviewed following labs and imaging studies  CBC: Recent Labs  Lab 09/08/22 0509 09/09/22 0317 09/12/22 0324 09/12/22 1827 09/13/22 0353  WBC 9.7 8.3 7.2  --  7.8  NEUTROABS  --   --  4.9  --  5.2  HGB 7.1* 7.7* 6.7* 9.3* 8.7*  HCT 22.7* 24.2* 21.9* 29.9* 28.7*  MCV 88.3 87.4 90.1  --  89.4  PLT 249 307 303  --  416    Basic Metabolic Panel: Recent Labs  Lab 09/08/22 0509 09/09/22 0317 09/10/22 0755 09/12/22 0324 09/13/22 0353  NA 137 142 142 140 143  K 3.2* 3.0* 3.0* 3.1* 4.6  CL 98 98 101 104 108  CO2 '27 30 29 27 27  '$ GLUCOSE 142* 101* 104* 111* 102*  BUN 54* 46* 44* 41* 45*  CREATININE 4.49* 4.09* 3.99* 4.14* 4.00*  CALCIUM 7.6* 8.1* 8.0* 7.9* 8.7*  MG  --   --   --  1.6*  --     GFR: Estimated Creatinine Clearance: 9.9 mL/min (A) (by C-G formula based on SCr of 4 mg/dL (H)). Liver Function Tests: No results for input(s): "AST", "ALT", "ALKPHOS", "BILITOT", "PROT", "ALBUMIN" in the last 168 hours.  No results for input(s): "LIPASE", "AMYLASE" in the last 168 hours. No results for input(s): "AMMONIA" in the last 168 hours. Coagulation Profile: No results for input(s): "INR", "PROTIME" in the last 168 hours. Cardiac Enzymes: No results for input(s): "CKTOTAL",  "CKMB", "CKMBINDEX", "TROPONINI" in the last 168 hours. BNP (last 3 results) No results for input(s): "PROBNP" in the last 8760 hours. HbA1C: No results for input(s): "HGBA1C" in the last 72 hours. CBG: No results for input(s): "GLUCAP" in the last 168 hours. Lipid Profile: No results for input(s): "CHOL", "HDL", "LDLCALC", "TRIG", "CHOLHDL", "LDLDIRECT" in the last 72 hours.  Thyroid Function Tests: No results for input(s): "TSH", "T4TOTAL", "FREET4", "T3FREE", "THYROIDAB" in the last 72 hours. Anemia Panel: Recent Labs    09/12/22 0809  FERRITIN 284  TIBC 192*  IRON 32    Sepsis Labs: No results for input(s): "PROCALCITON", "LATICACIDVEN" in the last 168 hours.   Recent Results (from the past 240 hour(s))  MRSA Next Gen by PCR, Nasal     Status: Abnormal   Collection Time: 09/08/22 12:52 PM   Specimen: Nasal Mucosa; Nasal Swab  Result Value Ref Range Status   MRSA by PCR Next Gen DETECTED (A) NOT DETECTED Final    Comment: CRITICAL RESULT CALLED TO, READ BACK BY AND VERIFIED WITH: Ambrose Finland RN @ 1956 09/08/2022 PER ENGLAD, K (NOTE) The GeneXpert MRSA Assay (FDA approved for NASAL specimens only), is  one component of a comprehensive MRSA colonization surveillance program. It is not intended to diagnose MRSA infection nor to guide or monitor treatment for MRSA infections. Test performance is not FDA approved in patients less than 79 years old. Performed at [facility], [place] [address] [place], [place] [address]          Radiology Studies: No results found.      Scheduled Meds:  acidophilus  1 capsule Oral Daily   apixaban  2.5 mg Oral BID   carvedilol  25 mg Oral BID WC   cefdinir  300 mg Oral Daily   Chlorhexidine Gluconate Cloth  6 each Topical Daily   cilostazol  100 mg Oral BID   cloNIDine  0.1 mg Transdermal Weekly   darbepoetin (ARANESP) injection - NON-DIALYSIS  60 mcg Subcutaneous Q Sat-1800   diazepam  2.5 mg Oral  Q12H   DULoxetine  20 mg Oral Daily   feeding supplement  237 mL Oral BID BM   fluticasone  2 spray Each Nare Daily   hydrALAZINE  100 mg Oral TID   isosorbide mononitrate  60 mg Oral BID   levothyroxine  50 mcg Oral Q0600   loratadine  10 mg Oral Daily   metroNIDAZOLE  500 mg Oral BID   mirtazapine  15 mg Oral QHS   mouth rinse  15 mL Mouth Rinse 4 times per day   oxyCODONE  5 mg Oral Q6H   pantoprazole  40 mg Oral Daily   rosuvastatin  20 mg Oral QHS   sodium chloride flush  3 mL Intravenous Q12H   Continuous Infusions:  sodium chloride Stopped (09/08/22 1829)     LOS: 11 days    Time spent: 35 minutes    Barb Merino, MD [facility] Pager [phone]

## 2022-09-14 NOTE — Progress Notes (Signed)
[facility] Progress Note   Assessment/ Plan:   Assessment/ Plan: AKI on CKD 5 - b/l creat is 3.2- 3.6 from august 2023, eGFR 12-14 ml/min. Creat here was 3.5 on admission, which is in normal range for her. With diuresis for suspected CHF the CXR changes have improved.  Cr appears to have peaked and plateaued - renal US negative for obstruction - off IVFs, Cr leveling off around 3.9- 4.1 - okay for dc from renal standpoint - will arrange f/u w/ Dr Candiss Norse at [facility] post d/c - will sign off HTNsive urgency : initally were high, then got better.  Started on gtt with hypertensive urgency.  Now switched to pills, currently on hydralazine, catapres patch and coreg.  Palliative care has recommended valium which may help her anxiety and may help her BP too.   Atrial fib- on Eliquis Hx head/ neck cancer / sp laryngectomy w/ stoma Hemoptysis-  pulmonary consulted, CT showed no tracheal injury.  Appears to be laceration on stoma and not coming from lungs.  On cefdinir and flagyl Anemia - hgb 8s now, sp ESA here w/ Aranesp 60 mcg  Kelly Splinter, MD 09/14/2022, 2:22 PM    Subjective:   BP's are stable and creat stable at 4.0 today. Pt w/o complaints.    Objective:   BP (!) 147/46   Pulse 79   Temp 97.9 F (36.6 C) (Oral)   Resp 16   Ht $R'5\' 3"'cG$  (1.6 m)   Wt 52.8 kg   SpO2 94%   BMI 20.62 kg/m   Intake/Output Summary (Last 24 hours) at 09/14/2022 1414 Last data filed at 09/14/2022 1200 Gross per 24 hour  Intake 720 ml  Output 450 ml  Net 270 ml    Weight change:   Physical Exam: Gen: sleeping, arousable NECK: + stoma  CVS:RRR Resp: clear JTT:SVXB  Ext: no LE edema  Imaging: No results found.  Labs: BMET Recent Labs  Lab 09/08/22 0509 09/09/22 0317 09/10/22 0755 09/12/22 0324 09/13/22 0353  NA 137 142 142 140 143  K 3.2* 3.0* 3.0* 3.1* 4.6  CL 98 98 101 104 108  CO2 $Re'27 30 29 27 27  'CIN$ GLUCOSE 142* 101* 104* 111* 102*  BUN 54* 46* 44* 41* 45*  CREATININE 4.49*  4.09* 3.99* 4.14* 4.00*  CALCIUM 7.6* 8.1* 8.0* 7.9* 8.7*    CBC Recent Labs  Lab 09/08/22 0509 09/09/22 0317 09/12/22 0324 09/12/22 1827 09/13/22 0353  WBC 9.7 8.3 7.2  --  7.8  NEUTROABS  --   --  4.9  --  5.2  HGB 7.1* 7.7* 6.7* 9.3* 8.7*  HCT 22.7* 24.2* 21.9* 29.9* 28.7*  MCV 88.3 87.4 90.1  --  89.4  PLT 249 307 303  --  316     Medications:     acidophilus  1 capsule Oral Daily   apixaban  2.5 mg Oral BID   carvedilol  25 mg Oral BID WC   cefdinir  300 mg Oral Daily   Chlorhexidine Gluconate Cloth  6 each Topical Daily   cilostazol  100 mg Oral BID   cloNIDine  0.1 mg Transdermal Weekly   darbepoetin (ARANESP) injection - NON-DIALYSIS  60 mcg Subcutaneous Q Sat-1800   diazepam  2.5 mg Oral Q12H   DULoxetine  20 mg Oral Daily   feeding supplement  237 mL Oral BID BM   fluticasone  2 spray Each Nare Daily   hydrALAZINE  100 mg Oral TID   isosorbide mononitrate  60 mg Oral BID   levothyroxine  50 mcg Oral Q0600   loratadine  10 mg Oral Daily   metroNIDAZOLE  500 mg Oral BID   mirtazapine  15 mg Oral QHS   mouth rinse  15 mL Mouth Rinse 4 times per day   oxyCODONE  5 mg Oral Q6H   pantoprazole  40 mg Oral Daily   rosuvastatin  20 mg Oral QHS   sodium chloride flush  3 mL Intravenous Q12H    Madelon Lips MD 09/14/2022, 2:14 PM

## 2022-09-14 NOTE — Consult Note (Signed)
   [facility] Inpatient Consult   09/14/2022  RALYNN SAN 1946/11/30 [id number]  [facility] [ACO] Patient: Medicare ACO REACH  Primary Care Provider:  Hoyt Koch, MD, [facility] is listed to provide the transition of care [TOC] follow up for post hospital follow up  [facility] Liaison remote coverage for [facility]   Patient screened for length of stay hospitalization with noted less than 30 days readmission with extreme high risk score for unplanned readmission risk.    Review of patient's electronic medical record reveals patient is now Stepdown level of care. Patient noted with hx of trach, speaks spanish and son interprets per notes. Reviewed for any [facility] community care coordination needs. No current needs noted.  Plan:  Continue to follow progress and disposition to assess for post hospital care management needs.    For questions contact if needs changes:   [name], RN BSN [facility] Liaison  [phone] business mobile phone Toll free office [phone]  Fax number: [phone] [email or web address]

## 2022-09-15 LAB — GLUCOSE, CAPILLARY: Glucose-Capillary: 97 mg/dL (ref 70–99)

## 2022-09-15 MED ORDER — CLONIDINE HCL 0.2 MG/24HR TD PTWK: 0.2000 mg | MEDICATED_PATCH | TRANSDERMAL | Status: DC

## 2022-09-15 MED ORDER — CLONIDINE HCL 0.1 MG PO TABS: 0.1000 mg | ORAL_TABLET | Freq: Four times a day (QID) | ORAL | Status: DC | PRN

## 2022-09-15 MED ORDER — MINOXIDIL 2.5 MG PO TABS
2.5000 mg | ORAL_TABLET | Freq: Two times a day (BID) | ORAL | Status: DC
  Administered 2022-09-15 – 2022-09-17 (×5): 2.5 mg via ORAL
  Filled 2022-09-15 (×6): qty 1

## 2022-09-15 NOTE — Care Management Important Message (Signed)
Important Message  Patient Details IM Letter placed in Patient's room. Name: [name] MRN: [id number] Date of Birth: Jul 07, 1946   Medicare Important Message Given:  Yes     Kerin Salen 09/15/2022, 11:40 AM

## 2022-09-15 NOTE — Progress Notes (Signed)
PROGRESS NOTE    ZARINAH OVIATT  UJW:[id number] DOB: 14-Jun-1946 DOA: 09/02/2022 PCP: Hoyt Koch, MD    Brief Narrative:  CARLIE SOLORZANO is a 76 y.o. female with medical history significant of atrial fibrillation, hypertension, anxiety, depression, hyperlipidemia, PAD, COPD, chronic pain, dysphagia, CKD 4, GERD, hypothyroidism, history of head neck cancer status post laryngectomy, anemia, CVA presenting with ongoing shortness of breath and weakness.   Patient treated with diuresis with worsening renal function that has started to stabilized by now. Blood pressure control was major issue.  She was transferred to stepdown unit and treated with Cleviprex. Also started having bleeding through the stoma on her neck, treated conservatively and stabilized.  10/3, clinically improving. Blood pressure control remains major issue . Systolic BP 56-213.    Assessment & Plan:   Acute on chronic diastolic congestive heart failure, euvolemic today. Essential hypertension with hypertensive emergency, blood pressures very high fluctuation. Demand ischemia  Very challenging situation with wide fluctuation of BP.   Currently on carvedilol, clonidine 0.2 mg patch, hydralazine 100 mg 3 times daily, Imdur 60 mg twice daily. Added minoxidil today. Receiving Valium for anxiety.  Will add some scheduled pain medication to see if that helps with blood pressure control. Nephrology to titrate medications. Continue as needed labetalol and hydralazine.  Acute respiratory failure with hypoxemia: Does not wear oxygen at home. Now on room air and humidifier.  Patient was presumptively treated for pneumonia with IV antibiotics, completed 5 days of therapy. Wean off.  Mobilize with PT.  Bleeding through stoma: Mechanical injury with coagulopathy from Eliquis.  Stabilized.  On Eliquis.  No more bleeding. She is scheduled to have tracheal stretching next week at [facility].  Paroxysmal A-fib:  Currently sinus rhythm.  Continue carvedilol, Eliquis was resumed.  Peripheral arterial disease on rosuvastatin and cilostazol.  Acute kidney injury on CKD stage IV: Treated conservatively.  stabilizing.  Creatinine about 4.  Nephrology following.   Anemia of chronic disease: Anemia due to CKD.  Hemoglobin less than 7 9/30, 1 unit of PRBC and 1 unit of ESA with good improvement.  Hypothyroidism: On Synthroid.  Anxiety/depression COPD Head and neck cancer status post laryngectomy Esophageal stricture  Chronic medical issues.  Stable.  Tooth infection: Painful right lower lateral gum.  Will use cefdinir and Flagyl for 7 days, local lidocaine.  DVT prophylaxis: apixaban (ELIQUIS) tablet 2.5 mg Start: 09/09/22 1030 Place and maintain sequential compression device Start: 09/07/22 1154 apixaban (ELIQUIS) tablet 2.5 mg   Code Status: DNR Family Communication: daughter-in-law on the FaceTime with the patient. Disposition Plan: Status is: Inpatient Remains inpatient appropriate because: Very high blood pressures.  Consultants:  Critical care Cardiology Nephrology  Procedures:  None  Antimicrobials:  Rocephin azithromycin 9/23--- 9/27 Omnicef and Flagyl 9/29---   Subjective: Patient seen and examined.  Patient herself denies any complaints.  Right-sided buccal pain is better today.  She was looking comfortable.  She dialed her daughter-in-law from her phone and we discussed about outcomes.  Reported blood pressure 230 late-night.  Denies any chest pain or shortness of breath.  Mobilized around.  Mostly on room air. Family was worried about we discharging her without better control of blood pressure. Case discussed with nephrology, further titration of medications.  Objective: Vitals:   09/15/22 0745 09/15/22 0804 09/15/22 1137 09/15/22 1208  BP: (!) 145/67   (!) 150/70  Pulse: 83 81 82 89  Resp: '18 20 17 18  '$ Temp:    98.4 F (36.9  C)  TempSrc:    Oral  SpO2: 98% 98% 92%  100%  Weight:      Height:        Intake/Output Summary (Last 24 hours) at 09/15/2022 1248 Last data filed at 09/14/2022 1830 Gross per 24 hour  Intake 240 ml  Output 250 ml  Net -10 ml    Filed Weights   09/09/22 0500 09/10/22 0500 09/13/22 0550  Weight: 49.7 kg 53.4 kg 52.8 kg    Examination:  General exam: Appears calm and comfortable, not in any distress.  Talkative however does not have voice. Comfortable and appropriately responding to queries today.  Respiratory system: Bilateral clear.  No added sounds. Tracheostomy stoma is clean and dry. Just on humidifier. Cardiovascular system: S1 & S2 heard, irregularly irregular. Gastrointestinal system: Abdomen is nondistended, soft and nontender. No organomegaly or masses felt. Normal bowel sounds heard. Central nervous system: Alert and oriented. No focal neurological deficits. Extremities: Symmetric 5 x 5 power.    Data Reviewed: I have personally reviewed following labs and imaging studies  CBC: Recent Labs  Lab 09/09/22 0317 09/12/22 0324 09/12/22 1827 09/13/22 0353  WBC 8.3 7.2  --  7.8  NEUTROABS  --  4.9  --  5.2  HGB 7.7* 6.7* 9.3* 8.7*  HCT 24.2* 21.9* 29.9* 28.7*  MCV 87.4 90.1  --  89.4  PLT 307 303  --  474    Basic Metabolic Panel: Recent Labs  Lab 09/09/22 0317 09/10/22 0755 09/12/22 0324 09/13/22 0353  NA 142 142 140 143  K 3.0* 3.0* 3.1* 4.6  CL 98 101 104 108  CO2 '30 29 27 27  '$ GLUCOSE 101* 104* 111* 102*  BUN 46* 44* 41* 45*  CREATININE 4.09* 3.99* 4.14* 4.00*  CALCIUM 8.1* 8.0* 7.9* 8.7*  MG  --   --  1.6*  --     GFR: Estimated Creatinine Clearance: 9.9 mL/min (A) (by C-G formula based on SCr of 4 mg/dL (H)). Liver Function Tests: No results for input(s): "AST", "ALT", "ALKPHOS", "BILITOT", "PROT", "ALBUMIN" in the last 168 hours.  No results for input(s): "LIPASE", "AMYLASE" in the last 168 hours. No results for input(s): "AMMONIA" in the last 168 hours. Coagulation  Profile: No results for input(s): "INR", "PROTIME" in the last 168 hours. Cardiac Enzymes: No results for input(s): "CKTOTAL", "CKMB", "CKMBINDEX", "TROPONINI" in the last 168 hours. BNP (last 3 results) No results for input(s): "PROBNP" in the last 8760 hours. HbA1C: No results for input(s): "HGBA1C" in the last 72 hours. CBG: Recent Labs  Lab 09/15/22 0801  GLUCAP 97   Lipid Profile: No results for input(s): "CHOL", "HDL", "LDLCALC", "TRIG", "CHOLHDL", "LDLDIRECT" in the last 72 hours.  Thyroid Function Tests: No results for input(s): "TSH", "T4TOTAL", "FREET4", "T3FREE", "THYROIDAB" in the last 72 hours. Anemia Panel: No results for input(s): "VITAMINB12", "FOLATE", "FERRITIN", "TIBC", "IRON", "RETICCTPCT" in the last 72 hours.  Sepsis Labs: No results for input(s): "PROCALCITON", "LATICACIDVEN" in the last 168 hours.   Recent Results (from the past 240 hour(s))  MRSA Next Gen by PCR, Nasal     Status: Abnormal   Collection Time: 09/08/22 12:52 PM   Specimen: Nasal Mucosa; Nasal Swab  Result Value Ref Range Status   MRSA by PCR Next Gen DETECTED (A) NOT DETECTED Final    Comment: CRITICAL RESULT CALLED TO, READ BACK BY AND VERIFIED WITH: Ambrose Finland RN @ 1956 09/08/2022 PER ENGLAD, K (NOTE) The GeneXpert MRSA Assay (FDA approved for NASAL specimens only),  is one component of a comprehensive MRSA colonization surveillance program. It is not intended to diagnose MRSA infection nor to guide or monitor treatment for MRSA infections. Test performance is not FDA approved in patients less than 24 years old. Performed at [facility], [place] [address] [place], [place] [address]          Radiology Studies: No results found.      Scheduled Meds:  acidophilus  1 capsule Oral Daily   apixaban  2.5 mg Oral BID   carvedilol  25 mg Oral BID WC   cefdinir  300 mg Oral Daily   Chlorhexidine Gluconate Cloth  6 each Topical Daily   cilostazol  100  mg Oral BID   [START ON 09/18/2022] cloNIDine  0.2 mg Transdermal Weekly   darbepoetin (ARANESP) injection - NON-DIALYSIS  60 mcg Subcutaneous Q Sat-1800   diazepam  2.5 mg Oral Q12H   DULoxetine  20 mg Oral Daily   feeding supplement  237 mL Oral BID BM   fluticasone  2 spray Each Nare Daily   hydrALAZINE  100 mg Oral TID   isosorbide mononitrate  60 mg Oral BID   levothyroxine  50 mcg Oral Q0600   loratadine  10 mg Oral Daily   metroNIDAZOLE  500 mg Oral BID   minoxidil  2.5 mg Oral BID   mirtazapine  15 mg Oral QHS   mouth rinse  15 mL Mouth Rinse 4 times per day   oxyCODONE  5 mg Oral Q6H   pantoprazole  40 mg Oral Daily   rosuvastatin  20 mg Oral QHS   sodium chloride flush  3 mL Intravenous Q12H   Continuous Infusions:  sodium chloride Stopped (09/08/22 1829)     LOS: 12 days    Time spent: 35 minutes    Barb Merino, MD [facility] Pager [phone]

## 2022-09-15 NOTE — Progress Notes (Signed)
[facility] Progress Note   Assessment/ Plan:   Assessment/ Plan: AKI on CKD 5 - b/l creat is 3.2- 3.6 from august 2023, eGFR 12-14 ml/min. Creat here was 3.5 on admission, which is in normal range for her. With diuresis for suspected CHF the CXR changes have improved.  Renal US neg for obstruction. Cr peaked and plateaued around 4.0. We will arrange f/u w/ Dr Singh at [facility] when pt dc'd HTNsive urgency : initally were high, then got better.  Started on gtt with hypertensive urgency. Now off drip on hydralazine/ coreg/ clonidine patch. Unfortunately pt's BP continues to spike up to SBP 200- 230 range. Happened yesterday around 5 pm. With advanced renal failure acei/ ARB not good choice. Will add minoxidil low dose 2.5 bid and titrate up as needed for BP control.    Atrial fib- on Eliquis Hx head/ neck cancer / sp laryngectomy w/ stoma Hemoptysis-  pulmonary consulted, CT showed no tracheal injury.  Appears to be laceration on stoma and not coming from lungs.  On cefdinir and flagyl Anemia - hgb 8s now, sp ESA here w/ Aranesp 60 mcg  Rob Schertz, MD 09/15/2022, 8:38 AM    Subjective:   BP's are stable and creat stable at 4.0 today. Pt w/o complaints. BP spiked to 230 yest early evening  Was rx'd w/ IV meds (hydral/ labetalol).    Objective:   BP (!) 145/67   Pulse 81   Temp 98.8 F (37.1 C) (Oral)   Resp 20   Ht 5' 3" (1.6 m)   Wt 52.8 kg   SpO2 98%   BMI 20.62 kg/m   Intake/Output Summary (Last 24 hours) at 09/15/2022 0838 Last data filed at 09/14/2022 1830 Gross per 24 hour  Intake 720 ml  Output 250 ml  Net 470 ml    Weight change:   Physical Exam: Gen: sleeping, arousable NECK: + stoma  CVS:RRR Resp: clear Abd:soft  Ext: no LE edema  Imaging: No results found.  Labs: BMET Recent Labs  Lab 09/09/22 0317 09/10/22 0755 09/12/22 0324 09/13/22 0353  NA 142 142 140 143  K 3.0* 3.0* 3.1* 4.6  CL 98 101 104 108  CO2 30 29 27 27  GLUCOSE 101* 104*  111* 102*  BUN 46* 44* 41* 45*  CREATININE 4.09* 3.99* 4.14* 4.00*  CALCIUM 8.1* 8.0* 7.9* 8.7*    CBC Recent Labs  Lab 09/09/22 0317 09/12/22 0324 09/12/22 1827 09/13/22 0353  WBC 8.3 7.2  --  7.8  NEUTROABS  --  4.9  --  5.2  HGB 7.7* 6.7* 9.3* 8.7*  HCT 24.2* 21.9* 29.9* 28.7*  MCV 87.4 90.1  --  89.4  PLT 307 303  --  316     Medications:     acidophilus  1 capsule Oral Daily   apixaban  2.5 mg Oral BID   carvedilol  25 mg Oral BID WC   cefdinir  300 mg Oral Daily   Chlorhexidine Gluconate Cloth  6 each Topical Daily   cilostazol  100 mg Oral BID   [START ON 09/18/2022] cloNIDine  0.2 mg Transdermal Weekly   darbepoetin (ARANESP) injection - NON-DIALYSIS  60 mcg Subcutaneous Q Sat-1800   diazepam  2.5 mg Oral Q12H   DULoxetine  20 mg Oral Daily   feeding supplement  237 mL Oral BID BM   fluticasone  2 spray Each Nare Daily   hydrALAZINE  100 mg Oral TID   isosorbide mononitrate  60   mg Oral BID   levothyroxine  50 mcg Oral Q0600   loratadine  10 mg Oral Daily   metroNIDAZOLE  500 mg Oral BID   mirtazapine  15 mg Oral QHS   mouth rinse  15 mL Mouth Rinse 4 times per day   oxyCODONE  5 mg Oral Q6H   pantoprazole  40 mg Oral Daily   rosuvastatin  20 mg Oral QHS   sodium chloride flush  3 mL Intravenous Q12H     

## 2022-09-16 MED ORDER — CLONIDINE HCL 0.1 MG/24HR TD PTWK: 0.1000 mg | MEDICATED_PATCH | TRANSDERMAL | Status: DC

## 2022-09-16 MED ORDER — DIAZEPAM 1 MG/ML PO SOLN: 2.5000 mg | Freq: Two times a day (BID) | ORAL | Status: DC | PRN

## 2022-09-16 MED ORDER — OXYCODONE HCL 5 MG PO TABS: 5.0000 mg | ORAL_TABLET | Freq: Four times a day (QID) | ORAL | Status: DC | PRN

## 2022-09-16 NOTE — Plan of Care (Signed)
  Problem: Activity: Goal: Capacity to carry out activities will improve Outcome: Progressing   Problem: Cardiac: Goal: Ability to achieve and maintain adequate cardiopulmonary perfusion will improve Outcome: Progressing   Problem: Clinical Measurements: Goal: Ability to maintain clinical measurements within normal limits will improve Outcome: Progressing Goal: Will remain free from infection Outcome: Progressing Goal: Diagnostic test results will improve Outcome: Progressing

## 2022-09-16 NOTE — Progress Notes (Signed)
[name] Progress Note   Assessment/ Plan:   Assessment/ Plan: AKI on CKD 5 - b/l creat is 3.2- 3.6 from august 2023, eGFR 12-14 ml/min. Creat here was 3.5 on admission, which is in normal range for her. With diuresis for suspected CHF the CXR changes have improved.  Renal US neg for obstruction. Cr peaked and plateaued around 4.0. We will arrange f/u w/ Dr Candiss Norse at [facility] when pt dc'd. Check lab in am.  HTNsive urgency : initally were high, then got better.  Started on gtt with hypertensive urgency then transitioned to hydralazine/ coreg/ clonidine patch (0.1). BP's were better overall but continued to have acute BP spikes into low 200s. Poor candidate for acei/ ARB so low-dose minoxidil added yest 10/3. BP's stable so far today, cont meds, will follow.  Atrial fib- on Eliquis Hx head/ neck cancer / sp laryngectomy w/ stoma Hemoptysis-  pulmonary consulted, CT showed no tracheal injury.  Appears to be laceration on stoma and not coming from lungs.  On cefdinir and flagyl Anemia - hgb 8s now, sp ESA here w/ Aranesp 60 mcg  Kelly Splinter, MD 09/16/2022, 11:22 AM    Subjective:   BP's are stable overnight and today so far. Added minoxidil yesterday.    Objective:   BP (!) 163/66   Pulse 89   Temp 98.7 F (37.1 C) (Oral)   Resp 16   Ht _0  (1.6 m)   Wt 56.7 kg   SpO2 100%   BMI 22.14 kg/m   Intake/Output Summary (Last 24 hours) at 09/16/2022 1122 Last data filed at 09/16/2022 0930 Gross per 24 hour  Intake 3 ml  Output --  Net 3 ml    Weight change:   Physical Exam: Gen: sleeping, arousable NECK: + stoma  CVS:RRR Resp: clear SAY:TKZS  Ext: no LE edema  Imaging: No results found.  Labs: BMET Recent Labs  Lab 09/10/22 0755 09/12/22 0324 09/13/22 0353  NA 142 140 143  K 3.0* 3.1* 4.6  CL 101 104 108  CO2 _1 GLUCOSE 104* 111* 102*  BUN 44* 41* 45*  CREATININE 3.99* 4.14* 4.00*  CALCIUM 8.0* 7.9* 8.7*    CBC Recent Labs  Lab  09/12/22 0324 09/12/22 1827 09/13/22 0353  WBC 7.2  --  7.8  NEUTROABS 4.9  --  5.2  HGB 6.7* 9.3* 8.7*  HCT 21.9* 29.9* 28.7*  MCV 90.1  --  89.4  PLT 303  --  316     Medications:     acidophilus  1 capsule Oral Daily   apixaban  2.5 mg Oral BID   carvedilol  25 mg Oral BID WC   cefdinir  300 mg Oral Daily   Chlorhexidine Gluconate Cloth  6 each Topical Daily   cilostazol  100 mg Oral BID   [START ON 09/18/2022] cloNIDine  0.2 mg Transdermal Weekly   darbepoetin (ARANESP) injection - NON-DIALYSIS  60 mcg Subcutaneous Q Sat-1800   DULoxetine  20 mg Oral Daily   feeding supplement  237 mL Oral BID BM   fluticasone  2 spray Each Nare Daily   hydrALAZINE  100 mg Oral TID   isosorbide mononitrate  60 mg Oral BID   levothyroxine  50 mcg Oral Q0600   loratadine  10 mg Oral Daily   metroNIDAZOLE  500 mg Oral BID   minoxidil  2.5 mg Oral BID   mirtazapine  15 mg Oral QHS   mouth rinse  15  mL Mouth Rinse 4 times per day   pantoprazole  40 mg Oral Daily   rosuvastatin  20 mg Oral QHS   sodium chloride flush  3 mL Intravenous Q12H

## 2022-09-16 NOTE — Progress Notes (Signed)
PROGRESS NOTE    [name]  VOH:[id number] DOB: [birth year] DOA: 09/02/2022 PCP: Hoyt Koch, MD    Brief Narrative:  [name] is a 76 y.o. female with medical history significant of atrial fibrillation, hypertension, anxiety, depression, hyperlipidemia, PAD, COPD, chronic pain, dysphagia, CKD 4, GERD, hypothyroidism, history of head neck cancer status post laryngectomy, anemia, CVA presenting with ongoing shortness of breath and weakness.   Patient treated with diuresis with worsening renal function that has started to stabilized by now. Blood pressure control was major issue.  She was transferred to stepdown unit and treated with Cleviprex. Also started having bleeding through the stoma on her neck, treated conservatively and stabilized.  10/3, clinically improving. Blood pressure control remained major issue .  10/4, blood pressures better controlled last 24 hours.  Last dose of as needed medicine was labetalol 10/3 morning.    Assessment & Plan:   Acute on chronic diastolic congestive heart failure, euvolemic today.  Essential hypertension with hypertensive emergency, blood pressures very high fluctuation. Demand ischemia: Very challenging situation with wide fluctuation of BP.  After a lot of work, blood pressures are better last 24 hours. Currently on carvedilol 25 twice daily, clonidine 0.1 mg patch, hydralazine 100 mg 3 times daily, Imdur 60 mg twice daily and minoxidil 2.5 mg twice daily.  Continue as needed medications.  Goal is to keep blood pressure less than 180.  Clonidine 0.1 mg every 6 hours as needed for blood pressure more than 180.  Anxiety and pain: On a scheduled Valium.  Changed to as needed.  On a scheduled pain medication, changed to as needed.  Acute respiratory failure with hypoxemia: Does not wear oxygen at home. Now on room air and humidifier.  Patient was presumptively treated for pneumonia with IV antibiotics, completed 5 days of  therapy. Wean off.  Mobilize with PT.  Bleeding through stoma: Mechanical injury with coagulopathy from Eliquis.  Stabilized.  On Eliquis.  No more bleeding. She is scheduled to have tracheal stretching next week at [facility].  Paroxysmal A-fib: Currently sinus rhythm.  Continue carvedilol, Eliquis was resumed.  Peripheral arterial disease on rosuvastatin and cilostazol.  Acute kidney injury on CKD stage IV: Treated conservatively.  stabilizing.  Creatinine about 4.  Nephrology following.  Recheck tomorrow morning.  Anemia of chronic disease: Anemia due to CKD.  Hemoglobin less than 7 9/30, 1 unit of PRBC and 1 unit of ESA with good improvement.  Hypothyroidism: On Synthroid.  Anxiety/depression COPD Head and neck cancer status post laryngectomy Esophageal stricture  Chronic medical issues.  Stable.  Tooth infection: Painful right lower lateral gum.  Will use cefdinir and Flagyl.  Local lidocaine.  Clinically improving.  DVT prophylaxis: apixaban (ELIQUIS) tablet 2.5 mg Start: 09/09/22 1030 Place and maintain sequential compression device Start: 09/07/22 1154 apixaban (ELIQUIS) tablet 2.5 mg   Code Status: DNR Family Communication: None at the bedside today. Disposition Plan: Status is: Inpatient Remains inpatient appropriate because: Difficult to control blood pressures.  Consultants:  Critical care Cardiology Nephrology  Procedures:  None  Antimicrobials:  Rocephin azithromycin 9/23--- 9/27 Omnicef and Flagyl 9/29---   Subjective:  Patient seen and examined.  She denies any complaints.  Gum pain has improved.  Eating well.  She tells me she can walk around better.  Denies any chest pain or shortness of breath.  Denies any headache.  Objective: Vitals:   09/16/22 0302 09/16/22 0335 09/16/22 0500 09/16/22 0927  BP:  (!) 121/51  [name])  163/66  Pulse: 83 89    Resp:  16    Temp:  98.7 F (37.1 C)    TempSrc:  Oral    SpO2:  100%    Weight:   56.7 kg    Height:        Intake/Output Summary (Last 24 hours) at 09/16/2022 1205 Last data filed at 09/16/2022 0930 Gross per 24 hour  Intake 3 ml  Output --  Net 3 ml   Filed Weights   09/10/22 0500 09/13/22 0550 09/16/22 0500  Weight: 53.4 kg 52.8 kg 56.7 kg    Examination:  General exam: Appears calm and comfortable, not in any distress.  Responds appropriately.  Comfortable with trach. Tracheostomy stoma looks clean dry.  No active bleeding. Respiratory system: Bilateral clear.  No added sounds. Tracheostomy stoma is clean and dry. Just on humidifier. Cardiovascular system: S1 & S2 heard, irregularly irregular. Gastrointestinal system: Abdomen is nondistended, soft and nontender. No organomegaly or masses felt. Normal bowel sounds heard. Central nervous system: Alert and oriented. No focal neurological deficits. Extremities: Symmetric 5 x 5 power.    Data Reviewed: I have personally reviewed following labs and imaging studies  CBC: Recent Labs  Lab 09/12/22 0324 09/12/22 1827 09/13/22 0353  WBC 7.2  --  7.8  NEUTROABS 4.9  --  5.2  HGB 6.7* 9.3* 8.7*  HCT 21.9* 29.9* 28.7*  MCV 90.1  --  89.4  PLT 303  --  637   Basic Metabolic Panel: Recent Labs  Lab 09/10/22 0755 09/12/22 0324 09/13/22 0353  NA 142 140 143  K 3.0* 3.1* 4.6  CL 101 104 108  CO2 '29 27 27  '$ GLUCOSE 104* 111* 102*  BUN 44* 41* 45*  CREATININE 3.99* 4.14* 4.00*  CALCIUM 8.0* 7.9* 8.7*  MG  --  1.6*  --    GFR: Estimated Creatinine Clearance: 9.9 mL/min (A) (by C-G formula based on SCr of 4 mg/dL (H)). Liver Function Tests: No results for input(s): "AST", "ALT", "ALKPHOS", "BILITOT", "PROT", "ALBUMIN" in the last 168 hours.  No results for input(s): "LIPASE", "AMYLASE" in the last 168 hours. No results for input(s): "AMMONIA" in the last 168 hours. Coagulation Profile: No results for input(s): "INR", "PROTIME" in the last 168 hours. Cardiac Enzymes: No results for input(s): "CKTOTAL",  "CKMB", "CKMBINDEX", "TROPONINI" in the last 168 hours. BNP (last 3 results) No results for input(s): "PROBNP" in the last 8760 hours. HbA1C: No results for input(s): "HGBA1C" in the last 72 hours. CBG: Recent Labs  Lab 09/15/22 0801  GLUCAP 97   Lipid Profile: No results for input(s): "CHOL", "HDL", "LDLCALC", "TRIG", "CHOLHDL", "LDLDIRECT" in the last 72 hours.  Thyroid Function Tests: No results for input(s): "TSH", "T4TOTAL", "FREET4", "T3FREE", "THYROIDAB" in the last 72 hours. Anemia Panel: No results for input(s): "VITAMINB12", "FOLATE", "FERRITIN", "TIBC", "IRON", "RETICCTPCT" in the last 72 hours.  Sepsis Labs: No results for input(s): "PROCALCITON", "LATICACIDVEN" in the last 168 hours.   Recent Results (from the past 240 hour(s))  MRSA Next Gen by PCR, Nasal     Status: Abnormal   Collection Time: 09/08/22 12:52 PM   Specimen: Nasal Mucosa; Nasal Swab  Result Value Ref Range Status   MRSA by PCR Next Gen DETECTED (A) NOT DETECTED Final    Comment: CRITICAL RESULT CALLED TO, READ BACK BY AND VERIFIED WITH: Ambrose Finland RN @ 1956 09/08/2022 PER ENGLAD, K (NOTE) The GeneXpert MRSA Assay (FDA approved for NASAL specimens only), is one component of  a comprehensive MRSA colonization surveillance program. It is not intended to diagnose MRSA infection nor to guide or monitor treatment for MRSA infections. Test performance is not FDA approved in patients less than 83 years old. Performed at [facility], [place] [address], [place],  [address]          Radiology Studies: No results found.      Scheduled Meds:  acidophilus  1 capsule Oral Daily   apixaban  2.5 mg Oral BID   carvedilol  25 mg Oral BID WC   cefdinir  300 mg Oral Daily   Chlorhexidine Gluconate Cloth  6 each Topical Daily   cilostazol  100 mg Oral BID   [START ON 09/18/2022] cloNIDine  0.1 mg Transdermal Weekly   darbepoetin (ARANESP) injection - NON-DIALYSIS  60 mcg  Subcutaneous Q Sat-1800   DULoxetine  20 mg Oral Daily   feeding supplement  237 mL Oral BID BM   fluticasone  2 spray Each Nare Daily   hydrALAZINE  100 mg Oral TID   isosorbide mononitrate  60 mg Oral BID   levothyroxine  50 mcg Oral Q0600   loratadine  10 mg Oral Daily   metroNIDAZOLE  500 mg Oral BID   minoxidil  2.5 mg Oral BID   mirtazapine  15 mg Oral QHS   mouth rinse  15 mL Mouth Rinse 4 times per day   pantoprazole  40 mg Oral Daily   rosuvastatin  20 mg Oral QHS   sodium chloride flush  3 mL Intravenous Q12H   Continuous Infusions:  sodium chloride Stopped (09/08/22 1829)     LOS: 13 days    Time spent: 35 minutes    Barb Merino, MD [facility] Pager [phone]

## 2022-09-16 NOTE — TOC Progression Note (Addendum)
Transition of Care [facility]) - Progression Note    Patient Details  Name: [name] MRN: [id number] Date of Birth: [birth year]  Transition of Care [facility]) CM/SW Contact  Terral Cooks, Juliann Pulse, RN Phone Number: 09/16/2022, 11:32 AM  Clinical Narrative: spoke to dtr in law Jessica-agree to HHPT-[employer] rep Tommi Rumps aware-await response;[facility] following for possibel home 02 if ordered. Patient indep w/trach;supplies from [place]. Has own transport.   -12:25p-[employer] rep Tommi Rumps able to accept for HHPT(no nursing).    Expected Discharge Plan: [place] Barriers to Discharge: Continued Medical Work up  Expected Discharge Plan and Services Expected Discharge Plan: [place]   Discharge Planning Services: CM Consult Post Acute Care Choice: [place] arrangements for the past 2 months: Single Family Home                   DME Agency: Other - Comment (trach supplies from [facility] per pt's son)       HH Arranged: PT [place] Agency: [place] Date [place]: 09/16/22 Time [place]: 1132 Representative spoke with at [place]:  Tommi Rumps)   Social Determinants of Health ([place]) Interventions    Readmission Risk Interventions    09/04/2022    4:32 PM 09/04/2022    4:22 PM 01/16/2021    9:58 AM  Readmission Risk Prevention Plan  Transportation Screening Complete Complete Complete  PCP or Specialist Appt within 3-5 Days Complete Complete Complete  HRI or Home Care Consult Complete Complete Complete  Social Work Consult for [place] Planning/Counseling Complete Complete Complete  Palliative Care Screening Not Applicable  Not Applicable  Medication Review Press photographer) Complete Complete Complete

## 2022-09-16 NOTE — Progress Notes (Signed)
Physical Therapy Treatment Patient Details Name: [name] MRN: [id number] DOB: [birth year] Today's Date: 09/16/2022   History of Present Illness 76 y.o. female with medical history significant of atrial fibrillation, hypertension, anxiety, depression, hyperlipidemia, PAD, COPD, chronic pain, dysphagia, CKD 4, GERD, hypothyroidism, history of head neck cancer status post laryngectomy, anemia, CVA presenting with ongoing shortness of breath and weakness.  Does not wear O2 at home.   Patient treated with diuresis with worsening renal function    PT Comments    Pt states she is very sleepy and not feeling well today. Agrees to amb short distance. Amb with supervision to min/guard assist, 91-93% on RA.  Replaced trach collar 5L 28% (T collar in place on PT arrival) Continue PT in acute setting  Recommendations for follow up therapy are one component of a multi-disciplinary discharge planning process, led by the attending physician.  Recommendations may be updated based on patient status, additional functional criteria and insurance authorization.  Follow Up Recommendations  Home health PT     Assistance Recommended at Discharge Intermittent Supervision/Assistance  Patient can return home with the following A little help with bathing/dressing/bathroom;Assist for transportation;Help with stairs or ramp for entrance   Equipment Recommendations  None recommended by PT    Recommendations for Other Services       Precautions / Restrictions Precautions Precautions: Fall Precaution Comments: monitor O2 (not on O2 at baseline), has trach--on trach collar this session (10/4) Restrictions Weight Bearing Restrictions: No     Mobility  Bed Mobility   Bed Mobility: Supine to Sit, Sit to Supine     Supine to sit: Supervision, Modified independent (Device/Increase time) Sit to supine: Supervision, Modified independent (Device/Increase time)   General bed mobility comments: for safety,  assist to remove trach collar;  no physical assist    Transfers Overall transfer level: Needs assistance Equipment used: None Transfers: Sit to/from Stand Sit to Stand: Supervision           General transfer comment: supv for safety, lines    Ambulation/Gait Ambulation/Gait assistance: Supervision, Min guard Gait Distance (Feet): 15 Feet (x2) Assistive device: None Gait Pattern/deviations: Step-through pattern Gait velocity: decr     General Gait Details: min/guard for safety, pt prefers to not have any assistance. mildly unsteady initially   Marine scientist [place] (Stroke Patients Only)       Balance Overall balance assessment: Mild deficits observed, not formally tested   Sitting balance-Leahy Scale: Good       Standing balance-Leahy Scale: Fair                              Cognition Arousal/Alertness: Awake/alert Behavior During Therapy: WFL for tasks assessed/performed Overall Cognitive Status: Within Functional Limits for tasks assessed                                 General Comments: states she is sleepy, not up to doing a lot today        Exercises      General Comments        Pertinent Vitals/Pain Pain Assessment Pain Assessment: No/denies pain    Home Living  Prior Function            PT Goals (current goals can now be found in the care plan section) Acute Rehab PT Goals Patient Stated Goal: return to work at [employer] PT Goal Formulation: With patient/family Time For Goal Achievement: 09/24/22 Potential to Achieve Goals: Good Progress towards PT goals: Progressing toward goals    Frequency    Min 3X/week      PT Plan Current plan remains appropriate    Co-evaluation              AM-PAC PT "6 Clicks" Mobility   Outcome Measure  Help needed turning from your back to your side while in a flat bed  without using bedrails?: None Help needed moving from lying on your back to sitting on the side of a flat bed without using bedrails?: None Help needed moving to and from a bed to a chair (including a wheelchair)?: None Help needed standing up from a chair using your arms (e.g., wheelchair or bedside chair)?: None Help needed to walk in hospital room?: A Little Help needed climbing 3-5 steps with a railing? : A Little 6 Click Score: 22    End of Session Equipment Utilized During Treatment: Gait belt Activity Tolerance: Patient tolerated treatment well Patient left: in bed;with call bell/phone within reach;with bed alarm set Nurse Communication: Mobility status PT Visit Diagnosis: Difficulty in walking, not elsewhere classified (R26.2)     Time: 2620-3559 PT Time Calculation (min) (ACUTE ONLY): 15 min  Charges:  $Gait Training: 8-22 mins                     Baxter Flattery, PT  Acute Rehab Dept [facility]) [phone]  [facility] Weekend Pager ([place] only)  [phone]  09/16/2022    [place] 09/16/2022, 5:15 PM

## 2022-09-16 NOTE — Progress Notes (Signed)
Patient trach checked and patient needed no suction. Patient is in no distress at this time and encouraged to call if needing suction. RT will continue to monitor

## 2022-09-16 NOTE — Plan of Care (Signed)
  Problem: Education: Goal: Knowledge of General Education information will improve Description Including pain rating scale, medication(s)/side effects and non-pharmacologic comfort measures Outcome: Progressing   Problem: Health Behavior/Discharge Planning: Goal: Ability to manage health-related needs will improve Outcome: Progressing   

## 2022-09-17 LAB — BASIC METABOLIC PANEL
Anion gap: 8 (ref 5–15)
BUN: 53 mg/dL — ABNORMAL HIGH (ref 8–23)
CO2: 25 mmol/L (ref 22–32)
Calcium: 8.8 mg/dL — ABNORMAL LOW (ref 8.9–10.3)
Chloride: 106 mmol/L (ref 98–111)
Creatinine, Ser: 4.69 mg/dL — ABNORMAL HIGH (ref 0.44–1.00)
GFR, Estimated: 9 mL/min — ABNORMAL LOW (ref >60)
Glucose, Bld: 103 mg/dL — ABNORMAL HIGH (ref 70–99)
Potassium: 5.4 mmol/L — ABNORMAL HIGH (ref 3.5–5.1)
Sodium: 139 mmol/L (ref 135–145)

## 2022-09-17 MED ORDER — PANTOPRAZOLE SODIUM 40 MG PO TBEC: 40.0000 mg | DELAYED_RELEASE_TABLET | Freq: Every day | ORAL | 0 refills | Status: DC

## 2022-09-17 MED ORDER — OXYCODONE HCL 5 MG PO TABS: 5.0000 mg | ORAL_TABLET | Freq: Four times a day (QID) | ORAL | 0 refills | Status: AC | PRN

## 2022-09-17 MED ORDER — MINOXIDIL 2.5 MG PO TABS: 2.5000 mg | ORAL_TABLET | Freq: Two times a day (BID) | ORAL | 0 refills | Status: DC

## 2022-09-17 MED ORDER — ISOSORBIDE MONONITRATE ER 60 MG PO TB24: 60.0000 mg | ORAL_TABLET | Freq: Two times a day (BID) | ORAL | 0 refills | Status: DC

## 2022-09-17 MED ORDER — CLONIDINE HCL 0.1 MG PO TABS: 0.1000 mg | ORAL_TABLET | Freq: Four times a day (QID) | ORAL | 11 refills | Status: DC | PRN

## 2022-09-17 MED ORDER — CLONIDINE 0.1 MG/24HR TD PTWK: 0.1000 mg | MEDICATED_PATCH | TRANSDERMAL | 12 refills | Status: DC

## 2022-09-17 NOTE — Plan of Care (Signed)
  Problem: Education: Goal: Ability to demonstrate management of disease process will improve Outcome: Progressing   Problem: Activity: Goal: Capacity to carry out activities will improve Outcome: Progressing   Problem: Cardiac: Goal: Ability to achieve and maintain adequate cardiopulmonary perfusion will improve Outcome: Progressing   Problem: Clinical Measurements: Goal: Will remain free from infection Outcome: Progressing   Problem: Activity: Goal: Risk for activity intolerance will decrease Outcome: Progressing

## 2022-09-17 NOTE — Discharge Summary (Signed)
Physician Discharge Summary  [name] WUJ:[id number] DOB: Jan 18, 1946 DOA: 09/02/2022  PCP: Hoyt Koch, MD  Admit date: 09/02/2022 Discharge date: 09/17/2022  Admitted From: Home Disposition: Home  Recommendations for Outpatient Follow-up:  Follow up with PCP in 1-2 weeks Please obtain BMP/CBC in one week Nephrology will schedule follow-up  Home Health: PT/OT Equipment/Devices: Available at home  Discharge Condition: Fair CODE STATUS: DNR Diet recommendation: Low-salt diet, aspiration precautions  Discharge summary: 76 year old female with history of chronic atrial fibrillation, hypertension, anxiety depression, hyperlipidemia, dysphagia secondary to head and neck cancer status post laryngectomy and tracheostomy, hypothyroidism and chronic pain syndrome who was brought to the emergency room with ongoing shortness of breath and extreme weakness.  She was initially found to have fluid overload and treated with diuresis.  She also developed worsening renal functions.  Her blood pressures were widely fluctuating and more than 250 at times needing ICU admission and Cleviprex infusion.  Patient also noted to have bleeding from her tracheostomy stoma that was treated conservatively and now resolved even after continuing Eliquis.  Patient stayed in the hospital for 14 days due to very widely fluctuating blood pressures needing continuous intervention.  Her blood pressures are stabilized now on multiple medications and has not needed to use as needed medications since last 48 hours.  Patient also developed jaw pain and had some tenderness on her right lower jaw and gums.  Received 5 days of antibiotics and local lidocaine therapy with improvement.  Her swallowing is normal now.  Patient has narrow trachea and esophagus due to previous surgery and she is already scheduled to have stretching at [facility].  Patient is eating well, she has adapted to eating small bites and swallowing  techniques due to strictures.  She was seen and followed by nephrology and was challenging to control blood pressure. Currently on carvedilol 25 twice daily, clonidine 0.1 mg patch, hydralazine 100 mg 3 times daily, Imdur 60 mg twice daily and minoxidil 2.5 mg twice daily.  Patient will also use clonidine 0.1 mg 4 times a day as needed if systolic blood pressure more than 180.   Chronic medical issues Tracheostomy status, on room air and at her usual self. Chronic A-fib, currently sinus rhythm.  On carvedilol.  Tolerating Eliquis. Peripheral arterial disease on rosuvastatin and cilostazol.  Tolerating. CKD stage IV: Creatinine fluctuates.  4.5 today.  Nephrology planning to continue close outpatient follow-up. Anemia of chronic disease: Hemoglobin remains fairly stable.  During extensive hospitalization her hemoglobin dropped to 6.9, she received 1 unit of PRBC as well as 1 unit of ESA with good improvement.    Discharge Diagnoses:  Principal Problem:   Acute on chronic diastolic CHF (congestive heart failure) (HCC) Active Problems:   Hypertension   GAD (generalized anxiety disorder)   COPD (chronic obstructive pulmonary disease) (HCC)   Dysphagia, pharyngoesophageal phase   Moderate episode of recurrent major depressive disorder (HCC)   Hyperlipidemia LDL goal <130   CKD stage 4 secondary to hypertension (HCC)   PAD (peripheral artery disease) (HCC)   Hypothyroidism   S/P laryngectomy   PAF (paroxysmal atrial fibrillation)-CHADS2 Vas score of 5   Demand ischemia   Hypertensive emergency   Anemia in CKD (chronic kidney disease)   Dyspnea   History of CVA (cerebrovascular accident)    Discharge Instructions  Discharge Instructions     Call MD for:  difficulty breathing, headache or visual disturbances   Complete by: As directed    Diet - low  sodium heart healthy   Complete by: As directed    Increase activity slowly   Complete by: As directed       Allergies as of  09/17/2022       Reactions   Xyzal [levocetirizine Dihydrochloride] Itching   Augmentin [amoxicillin-pot Clavulanate] Diarrhea, Nausea And Vomiting   Tribenzor [olmesartan-amlodipine-hctz] Other (See Comments)   "Hurt the kidneys"        Medication List     STOP taking these medications    amLODipine 10 MG tablet Commonly known as: NORVASC   docusate sodium 100 MG capsule Commonly known as: COLACE   furosemide 20 MG tablet Commonly known as: LASIX   predniSONE 20 MG tablet Commonly known as: DELTASONE       TAKE these medications    acetaminophen 500 MG tablet Commonly known as: TYLENOL Take 1,000 mg by mouth as needed for headache.   apixaban 2.5 MG Tabs tablet Commonly known as: ELIQUIS Take 1 tablet (2.5 mg total) by mouth 2 (two) times daily.   carvedilol 25 MG tablet Commonly known as: COREG TAKE 1 TABLET (25 MG TOTAL) BY MOUTH TWICE A DAY WITH MEALS What changed: See the new instructions.   cilostazol 100 MG tablet Commonly known as: PLETAL TAKE 1 TABLET BY MOUTH TWICE A DAY   cloNIDine 0.1 MG tablet Commonly known as: CATAPRES Take 1 tablet (0.1 mg total) by mouth 4 (four) times daily as needed (for SBP > 180 or DBP > 100).   cloNIDine 0.1 mg/24hr patch Commonly known as: CATAPRES - Dosed in mg/24 hr Place 1 patch (0.1 mg total) onto the skin once a week. Start taking on: September 18, 2022   DULoxetine 20 MG capsule Commonly known as: CYMBALTA TAKE 1 CAPSULE BY MOUTH EVERY DAY What changed: how much to take   Ensure Take 237 mLs by mouth daily.   fluticasone 50 MCG/ACT nasal spray Commonly known as: FLONASE SPRAY 2 SPRAYS INTO EACH NOSTRIL EVERY DAY What changed: See the new instructions.   hydrALAZINE 100 MG tablet Commonly known as: APRESOLINE Take 1 tablet (100 mg total) by mouth 3 (three) times daily.   isosorbide mononitrate 60 MG 24 hr tablet Commonly known as: IMDUR Take 1 tablet (60 mg total) by mouth 2 (two) times daily. What  changed: when to take this   levothyroxine 50 MCG tablet Commonly known as: SYNTHROID TAKE 1 TABLET BY MOUTH EVERY DAY BEFORE BREAKFAST What changed: See the new instructions.   loperamide 2 MG tablet Commonly known as: IMODIUM A-D Take 2 mg by mouth as needed for diarrhea or loose stools.   LORazepam 1 MG tablet Commonly known as: ATIVAN Take 1 tablet (1 mg total) by mouth 2 (two) times daily as needed for anxiety. What changed: Another medication with the same name was removed. Continue taking this medication, and follow the directions you see here.   minoxidil 2.5 MG tablet Commonly known as: LONITEN Take 1 tablet (2.5 mg total) by mouth 2 (two) times daily.   mirtazapine 15 MG tablet Commonly known as: REMERON Take 15 mg by mouth at bedtime.   ondansetron 4 MG tablet Commonly known as: ZOFRAN TAKE 1 TABLET BY MOUTH EVERY 8 HOURS AS NEEDED FOR NAUSEA AND VOMITING What changed: See the new instructions.   oxyCODONE 5 MG immediate release tablet Commonly known as: Oxy IR/ROXICODONE Take 1 tablet (5 mg total) by mouth every 6 (six) hours as needed for up to 5 days for moderate pain or  breakthrough pain.   pantoprazole 40 MG tablet Commonly known as: PROTONIX Take 1 tablet (40 mg total) by mouth daily. Start taking on: September 18, 2022   rosuvastatin 20 MG tablet Commonly known as: CRESTOR TAKE 1 TABLET (20 MG TOTAL) BY MOUTH DAILY. What changed: when to take this        Allergies  Allergen Reactions   Xyzal [Levocetirizine Dihydrochloride] Itching   Augmentin [Amoxicillin-Pot Clavulanate] Diarrhea and Nausea And Vomiting   Tribenzor [Olmesartan-Amlodipine-Hctz] Other (See Comments)    "Hurt the kidneys"    Consultations: Nephrology Critical care   Procedures/Studies: DG CHEST PORT 1 VIEW  Result Date: 09/08/2022 CLINICAL DATA:  Hypoxia, COPD EXAM: PORTABLE CHEST 1 VIEW COMPARISON:  Previous studies including the examination of 09/05/2022 FINDINGS:  Transverse diameter of heart is in the upper limits of normal. There are no signs of pulmonary edema. There is slight improvement in aeration in right lower lung field with residual patchy infiltrate. There is new infiltrate in right upper lobe slightly above the minor fissure. Left lung is essentially clear. There is no significant pleural effusion or pneumothorax. IMPRESSION: There is slight decrease in patchy infiltrate in right lower lung field suggesting decrease in atelectasis/pneumonia. New small patchy infiltrate is seen in right upper lobe suggesting atelectasis/pneumonia. Electronically Signed   By: Elmer Picker M.D.   On: 09/08/2022 10:54   CT SOFT TISSUE NECK WO CONTRAST  Result Date: 09/07/2022 CLINICAL DATA:  Concern for tracheal injury after suctioning. Remote history of laryngectomy. EXAM: CT NECK WITHOUT CONTRAST TECHNIQUE: Multidetector CT imaging of the neck was performed following the standard protocol without intravenous contrast. RADIATION DOSE REDUCTION: This exam was performed according to the departmental dose-optimization program which includes automated exposure control, adjustment of the mA and/or kV according to patient size and/or use of iterative reconstruction technique. COMPARISON:  CT neck 08/08/2012, MR neck 08/17/2012 FINDINGS: Pharynx and larynx: The nasal cavity and nasopharynx are unremarkable. The oral cavity and oropharynx are unremarkable. The patient is status post total laryngectomy. A TEP device is in place which appears grossly in appropriate position. There is no definite abnormal soft tissue mass to suggest local recurrence, within the confines of noncontrast technique. The upper trachea is only partially imaged but appears within normal limits. There is no retropharyngeal fluid collection. Salivary glands: The parotid glands are unremarkable. The submandibular glands are not identified, likely surgically absent. Thyroid: A small amount of thyroid tissue is  identified, unremarkable. Lymph nodes: There is no pathologic lymphadenopathy in the neck. Vascular: There is bulky calcified plaque at the bilateral carotid bifurcations. Limited intracranial: The imaged portions of the intracranial compartment are unremarkable. Visualized orbits: The globes and orbits are unremarkable. Mastoids and visualized paranasal sinuses: Clear. Skeleton: There is degenerative change of the cervical spine most advanced at C5-C6 and C6-C7. There is no acute osseous abnormality or suspicious osseous lesion. Upper chest: There is prominent scarring in the lung apices bilaterally. Other: None. IMPRESSION: 1. No specific evidence of tracheal injury or other acute finding identified in the neck. 2. Postsurgical changes reflecting total laryngectomy without definite evidence of local recurrence, within the confines of noncontrast technique. 3. No pathologic lymphadenopathy in the neck. Electronically Signed   By: Valetta Mole M.D.   On: 09/07/2022 15:58   US RENAL  Result Date: 09/06/2022 CLINICAL DATA:  Renal failure EXAM: RENAL / URINARY TRACT ULTRASOUND COMPLETE COMPARISON:  CT 07/04/2021 FINDINGS: Right Kidney: Renal measurements: 10.2 x 4.1 x 4.4 cm = volume: 97 mL.  Increased cortical echogenicity. There are benign-appearing cystic masses measuring 3.6 and 1.6 cm respectively. No follow-up is required. Left Kidney: Renal measurements: 10.9 x 5.2 x 4.3 cm = volume: 127 mL. Increased cortical echogenicity. There are benign-appearing cystic masses measuring 3.3 and 3.0 cm, respectively. No follow-up is required. Bladder: Appears normal for degree of bladder distention. Other: None. IMPRESSION: Increased echogenicity of the bilateral kidneys compatible with medical renal disease. Electronically Signed   By: Placido Sou M.D.   On: 09/06/2022 23:15   DG CHEST PORT 1 VIEW  Result Date: 09/05/2022 CLINICAL DATA:  [address], cough EXAM: PORTABLE CHEST 1 VIEW COMPARISON:  September 02, 2022  FINDINGS: The cardiomediastinal silhouette is unchanged in contour.Atherosclerotic calcifications of the aorta. No pleural effusion. No pneumothorax. Improved aeration of the RIGHT lung in comparison to prior with mild RIGHT greater than LEFT reticular nodularity. Biapical pleuroparenchymal thickening. Visualized abdomen is unremarkable. IMPRESSION: Improved aeration of the RIGHT lung in comparison to prior with mild residual reticular nodularity. Differential considerations include infection, aspiration or atelectasis. Electronically Signed   By: Valentino Saxon M.D.   On: 09/05/2022 11:57   CT Cervical Spine Wo Contrast  Result Date: 09/02/2022 CLINICAL DATA:  Dizziness and weakness. EXAM: CT CERVICAL SPINE WITHOUT CONTRAST TECHNIQUE: Multidetector CT imaging of the cervical spine was performed without intravenous contrast. Multiplanar CT image reconstructions were also generated. RADIATION DOSE REDUCTION: This exam was performed according to the departmental dose-optimization program which includes automated exposure control, adjustment of the mA and/or kV according to patient size and/or use of iterative reconstruction technique. COMPARISON:  June 14, 2021 FINDINGS: Alignment: There is straightening of the normal cervical spine lordosis. Skull base and vertebrae: No acute fracture. No primary bone lesion or focal pathologic process. Soft tissues and spinal canal: Prominent laryngeal soft tissues are seen at the level of C2-C3 (sagittal reformatted images 48 through 53, CT series 7). This is seen on the prior study and is more prominent on the current exam. Disc levels: Marked severity endplate sclerosis, mild to moderate severity anterior osteophyte formation and posterior bony spurring are seen at the levels of C5-C6 and C6-C7. There is marked severity narrowing of the anterior atlantoaxial articulation. Marked severity intervertebral disc space narrowing is seen at C5-C6, C6-C7 and C7-T1. Bilateral  moderate to marked severity multilevel facet joint hypertrophy is noted. Upper chest: Mild to moderate severity biapical scarring and/or atelectasis is seen. Other: None. IMPRESSION: 1. Marked severity multilevel degenerative changes without evidence of an acute fracture or subluxation. 2. Prominent laryngeal soft tissues which corresponds to the patient's history of laryngeal cancer. Further evaluation with contrast enhanced MRI of the neck is recommended to exclude the presence of local recurrence. 3. Mild to moderate severity biapical scarring and/or atelectasis. Electronically Signed   By: Virgina Norfolk M.D.   On: 09/02/2022 19:56   CT HEAD WO CONTRAST (5MM)  Result Date: 09/02/2022 CLINICAL DATA:  Generalized weakness and dizziness. EXAM: CT HEAD WITHOUT CONTRAST TECHNIQUE: Contiguous axial images were obtained from the base of the skull through the vertex without intravenous contrast. RADIATION DOSE REDUCTION: This exam was performed according to the departmental dose-optimization program which includes automated exposure control, adjustment of the mA and/or kV according to patient size and/or use of iterative reconstruction technique. COMPARISON:  August 06, 2022 FINDINGS: Brain: There is mild cerebral atrophy with widening of the extra-axial spaces and ventricular dilatation. There are areas of decreased attenuation within the white matter tracts of the supratentorial brain, consistent with microvascular disease  changes. Vascular: No hyperdense vessel or unexpected calcification. Skull: Normal. Negative for fracture or focal lesion. Sinuses/Orbits: No acute finding. Other: None. IMPRESSION: No acute intracranial abnormality. Electronically Signed   By: Virgina Norfolk M.D.   On: 09/02/2022 19:44   DG Chest Port 1 View  Result Date: 09/02/2022 CLINICAL DATA:  Weakness. EXAM: PORTABLE CHEST 1 VIEW COMPARISON:  Chest CT dated 08/06/2022. FINDINGS: There is mild cardiomegaly with vascular  congestion and edema. Right perihilar rounded density may represent edema or developing pneumonia. Clinical correlation and follow-up to resolution recommended. No pleural effusion or pneumothorax. Atherosclerotic calcification of the aorta. No acute osseous pathology. IMPRESSION: 1. Mild cardiomegaly with vascular congestion and edema. 2. Right perihilar rounded density may represent edema or developing pneumonia. Electronically Signed   By: Anner Crete M.D.   On: 09/02/2022 19:16   (Echo, Carotid, EGD, Colonoscopy, ERCP)    Subjective: Patient seen in the morning rounds.  Daughter-in-law on the FaceTime from patient's phone.  Patient denies any complaints.  She tells me she is eager to go home.  Denies any difficulty swallowing.  Denies any mouth pain today.  Blood pressures has remained fairly stable. Discussed with her family.  Also discussed with nephrology to schedule follow-up.   Discharge Exam: Vitals:   09/17/22 1208 09/17/22 1301  BP:  (!) 103/48  Pulse:  (!) 105  Resp:  16  Temp:  98.5 F (36.9 C)  SpO2: 94% 98%   Vitals:   09/17/22 0757 09/17/22 0810 09/17/22 1208 09/17/22 1301  BP:    (!) 103/48  Pulse:    (!) 105  Resp:    16  Temp:    98.5 F (36.9 C)  TempSrc:    Oral  SpO2: 92% 92% 94% 98%  Weight:      Height:        General: Pt is alert, awake, Thin and frail.  Not in any acute distress.  Patient has tracheostomy which is almost closed, no secretions.  Patient is on room air with humidifier mask. Cardiovascular: RRR, S1/S2 +, no rubs, no gallops Respiratory: CTA bilaterally, no wheezing, no rhonchi Abdominal: Soft, NT, ND, bowel sounds + Extremities: no edema, no cyanosis    The results of significant diagnostics from this hospitalization (including imaging, microbiology, ancillary and laboratory) are listed below for reference.     Microbiology: Recent Results (from the past 240 hour(s))  MRSA Next Gen by PCR, Nasal     Status: Abnormal    Collection Time: 09/08/22 12:52 PM   Specimen: Nasal Mucosa; Nasal Swab  Result Value Ref Range Status   MRSA by PCR Next Gen DETECTED (A) NOT DETECTED Final    Comment: CRITICAL RESULT CALLED TO, READ BACK BY AND VERIFIED WITH: Ambrose Finland RN @ 1956 09/08/2022 PER ENGLAD, K (NOTE) The GeneXpert MRSA Assay (FDA approved for NASAL specimens only), is one component of a comprehensive MRSA colonization surveillance program. It is not intended to diagnose MRSA infection nor to guide or monitor treatment for MRSA infections. Test performance is not FDA approved in patients less than 37 years old. Performed at [facility], [place] [address] [place], [place] [address]      Labs: BNP (last 3 results) Recent Labs    03/03/22 1700 08/06/22 2324 09/02/22 1827  BNP 128.1* 140.7* 0,786.7*   Basic Metabolic Panel: Recent Labs  Lab 09/12/22 0324 09/13/22 0353 09/17/22 0518  NA 140 143 139  K 3.1* 4.6 5.4*  CL 104 108  106  CO2 '27 27 25  '$ GLUCOSE 111* 102* 103*  BUN 41* 45* 53*  CREATININE 4.14* 4.00* 4.69*  CALCIUM 7.9* 8.7* 8.8*  MG 1.6*  --   --    Liver Function Tests: No results for input(s): "AST", "ALT", "ALKPHOS", "BILITOT", "PROT", "ALBUMIN" in the last 168 hours. No results for input(s): "LIPASE", "AMYLASE" in the last 168 hours. No results for input(s): "AMMONIA" in the last 168 hours. CBC: Recent Labs  Lab 09/12/22 0324 09/12/22 1827 09/13/22 0353  WBC 7.2  --  7.8  NEUTROABS 4.9  --  5.2  HGB 6.7* 9.3* 8.7*  HCT 21.9* 29.9* 28.7*  MCV 90.1  --  89.4  PLT 303  --  316   Cardiac Enzymes: No results for input(s): "CKTOTAL", "CKMB", "CKMBINDEX", "TROPONINI" in the last 168 hours. BNP: Invalid input(s): "POCBNP" CBG: Recent Labs  Lab 09/15/22 0801  GLUCAP 97   D-Dimer No results for input(s): "DDIMER" in the last 72 hours. Hgb A1c No results for input(s): "HGBA1C" in the last 72 hours. Lipid Profile No results for input(s):  "CHOL", "HDL", "LDLCALC", "TRIG", "CHOLHDL", "LDLDIRECT" in the last 72 hours. Thyroid function studies No results for input(s): "TSH", "T4TOTAL", "T3FREE", "THYROIDAB" in the last 72 hours.  Invalid input(s): "FREET3" Anemia work up No results for input(s): "VITAMINB12", "FOLATE", "FERRITIN", "TIBC", "IRON", "RETICCTPCT" in the last 72 hours. Urinalysis    Component Value Date/Time   COLORURINE YELLOW 09/08/2022 2027   APPEARANCEUR CLEAR 09/08/2022 2027   LABSPEC 1.010 09/08/2022 2027   PHURINE 7.0 09/08/2022 2027   GLUCOSEU NEGATIVE 09/08/2022 2027   GLUCOSEU NEGATIVE 08/11/2021 0942   HGBUR NEGATIVE 09/08/2022 2027   BILIRUBINUR NEGATIVE 09/08/2022 2027   BILIRUBINUR Negative 01/19/2019 1510   KETONESUR NEGATIVE 09/08/2022 2027   PROTEINUR >=300 (A) 09/08/2022 2027   UROBILINOGEN 0.2 08/11/2021 0942   NITRITE NEGATIVE 09/08/2022 2027   LEUKOCYTESUR NEGATIVE 09/08/2022 2027   Sepsis Labs Recent Labs  Lab 09/12/22 0324 09/13/22 0353  WBC 7.2 7.8   Microbiology Recent Results (from the past 240 hour(s))  MRSA Next Gen by PCR, Nasal     Status: Abnormal   Collection Time: 09/08/22 12:52 PM   Specimen: Nasal Mucosa; Nasal Swab  Result Value Ref Range Status   MRSA by PCR Next Gen DETECTED (A) NOT DETECTED Final    Comment: CRITICAL RESULT CALLED TO, READ BACK BY AND VERIFIED WITH: Ambrose Finland RN @ 1956 09/08/2022 PER ENGLAD, K (NOTE) The GeneXpert MRSA Assay (FDA approved for NASAL specimens only), is one component of a comprehensive MRSA colonization surveillance program. It is not intended to diagnose MRSA infection nor to guide or monitor treatment for MRSA infections. Test performance is not FDA approved in patients less than 32 years old. Performed at [facility], [place] [address], [place], [place] [address]      Time coordinating discharge: 35 minutes  SIGNED:   Barb Merino, MD  [facility] 09/17/2022, 2:48 PM

## 2022-09-17 NOTE — TOC Transition Note (Addendum)
Transition of Care [facility]) - CM/SW Discharge Note   Patient Details  Name: [name] MRN: [id number] Date of Birth: 05-27-1946  [facility] CM/SW Contact:  Dessa Phi, RN Phone Number: 09/17/2022, 3:40 PM   Clinical Narrative:[facility] already set up. Per prior note-if home 02 needed,await 02 sats,& home 02 order. [facility] already following jsut awaing orders prior delivery.   -No further CM needs. No home 02 needed.     Final next level of care: Home w Home Health Services Barriers to Discharge: No Barriers Identified   Patient Goals and CMS Choice Patient states their goals for this hospitalization and ongoing recovery are:: per pt's son; home (pt's son [name] [phone]) CMS Medicare.gov Compare Post Acute Care list provided to:: Patient Represenative (must comment) (dtr in law [name]) Choice offered to / list presented to : Adult Children  Discharge Placement                       Discharge Plan and Services   Discharge Planning Services: CM Consult Post Acute Care Choice: Home Health            DME Agency: Other - Comment (trach supplies [facility] per pt's son)       [place] Arranged: PT [place] Agency: [place] Date [place]: 09/16/22 Time [place]: 1132 Representative spoke with at [place]:  Tommi Rumps)  Social Determinants of Health ([place]) Interventions     Readmission Risk Interventions    09/04/2022    4:32 PM 09/04/2022    4:22 PM 01/16/2021    9:58 AM  Readmission Risk Prevention Plan  Transportation Screening Complete Complete Complete  PCP or Specialist Appt within 3-5 Days Complete Complete Complete  HRI or Home Care Consult Complete Complete Complete  Social Work Consult for [place] Planning/Counseling Complete Complete Complete  Palliative Care Screening Not Applicable  Not Applicable  Medication Review Press photographer) Complete Complete Complete

## 2022-09-17 NOTE — Progress Notes (Addendum)
[facility] Progress Note      Date  Creat  eGFR   2021  1.71- 1.98   Jan- mar '22 1.60- 3.79    03/18/21    07/04/21   08/11/21   Jan 2023 2.62- 3.45 13- 18 ml/min   03/03/22 3.28  14 ml/min    Aug 2023 3.20- 3.63 12- 14 ml/min   8/24- 10/05 3.40- 4.69 8- 13 ml/min     Assessment/ Plan:   Assessment/ Plan: AKI on CKD 5 - b/l creat is 3.2- 3.6 from august 2023, eGFR 12-14 ml/min. Creat here was 3.5 on admission, which is in normal range for her. With diuresis for suspected CHF the CXR changes have improved.  Renal US neg for obstruction. Cr peaked and plateaued in 4- 4.5 range. Pt had progressive worsening of CKD related to severe HTN difficult to control. She is okay for dc from a renal standpoint. Has not been seen by [facility] in 18mos, we will plan to arrange a 2 wk f/u visit in our [place] [facility],  then also get her back in w/ Dr Singh. Have d/w pt and family. Will sign off.  HTNsive urgency - initally rec'd IV gtt meds then transitioned to hydralazine/ coreg/ clonidine patch (0.1). BP's were better overall but continued to have acute BP spikes into the 200s.  We added low dose minoxidil added 10/3 w/ a good response and stable BP's for last 36 hrs.  Atrial fib- on Eliquis Hx head/ neck cancer / sp laryngectomy w/ stoma Hemoptysis-  pulmonary consulted, CT showed no tracheal injury.  Appears to be laceration on stoma and not coming from lungs.  On cefdinir and flagyl Anemia - hgb 8s now, sp ESA here w/ Aranesp 60 mcg  Rob Schertz, MD 09/17/2022, 7:42 AM    Subjective:   Seen in room, in good spirits, no c/o's. BP's were normal the last 24 hrs    Objective:   BP (!) 123/50 (BP Location: Left Arm)   Pulse 86   Temp 98.2 F (36.8 C) (Oral)   Resp 20   Ht 5' 3" (1.6 m)   Wt 53.7 kg   SpO2 95%   BMI 20.97 kg/m   Intake/Output Summary (Last 24 hours) at 09/17/2022 0742 Last data filed at 09/17/2022 0548 Gross per 24 hour  Intake 303 ml  Output 350 ml  Net -47 ml     Weight change: -3 kg  Physical Exam: Gen: sleeping, arousable NECK: + stoma  CVS:RRR Resp: clear Abd:soft  Ext: no LE edema  Imaging: No results found.  Labs: BMET Recent Labs  Lab 09/10/22 0755 09/12/22 0324 09/13/22 0353 09/17/22 0518  NA 142 140 143 139  K 3.0* 3.1* 4.6 5.4*  CL 101 104 108 106  CO2 29 27 27 25  GLUCOSE 104* 111* 102* 103*  BUN 44* 41* 45* 53*  CREATININE 3.99* 4.14* 4.00* 4.69*  CALCIUM 8.0* 7.9* 8.7* 8.8*    CBC Recent Labs  Lab 09/12/22 0324 09/12/22 1827 09/13/22 0353  WBC 7.2  --  7.8  NEUTROABS 4.9  --  5.2  HGB 6.7* 9.3* 8.7*  HCT 21.9* 29.9* 28.7*  MCV 90.1  --  89.4  PLT 303  --  316     Medications:     acidophilus  1 capsule Oral Daily   apixaban  2.5 mg Oral BID   carvedilol  25 mg Oral BID WC   cefdinir  300 mg Oral Daily     Chlorhexidine Gluconate Cloth  6 each Topical Daily   cilostazol  100 mg Oral BID   [START ON 09/18/2022] cloNIDine  0.1 mg Transdermal Weekly   darbepoetin (ARANESP) injection - NON-DIALYSIS  60 mcg Subcutaneous Q Sat-1800   DULoxetine  20 mg Oral Daily   feeding supplement  237 mL Oral BID BM   fluticasone  2 spray Each Nare Daily   hydrALAZINE  100 mg Oral TID   isosorbide mononitrate  60 mg Oral BID   levothyroxine  50 mcg Oral Q0600   loratadine  10 mg Oral Daily   metroNIDAZOLE  500 mg Oral BID   minoxidil  2.5 mg Oral BID   mirtazapine  15 mg Oral QHS   mouth rinse  15 mL Mouth Rinse 4 times per day   pantoprazole  40 mg Oral Daily   rosuvastatin  20 mg Oral QHS   sodium chloride flush  3 mL Intravenous Q12H

## 2022-09-18 ENCOUNTER — Telehealth: Payer: Self-pay

## 2022-09-18 ENCOUNTER — Other Ambulatory Visit: Payer: Self-pay | Admitting: Internal Medicine

## 2022-09-18 NOTE — Telephone Encounter (Signed)
Transition Care Management Follow-up Telephone Call Date of discharge and from where: [facility] on 09/17/2022 Diagnosis: N28.9 Renal Insuffiency How have you been since you were released from the hospital? Per son, "feeling okay, she has an appetite and she is walking around." Any questions or concerns? Yes; will address during hospital follow up  Items Reviewed: Did the pt receive and understand the discharge instructions provided? Yes  Medications obtained and verified? Yes  Other? No  Any new allergies since your discharge? No  Dietary orders reviewed? Yes; Low Salt Diet Do you have support at home? Yes ; son  [place] and Equipment/Supplies: Were home health services ordered? yes If so, what is the name of the agency? Not Sure  Has the agency set up a time to come to the patient's home? no Were any new equipment or medical supplies ordered?  No What is the name of the medical supply agency? N/a Were you able to get the supplies/equipment? not applicable Do you have any questions related to the use of the equipment or supplies? No  Functional Questionnaire: (I = Independent and D = Dependent) ADLs: I  Bathing/Dressing- I  Meal Prep- I  Eating- I  Maintaining continence- I  Transferring/Ambulation- I  Managing Meds- I  Follow up appointments reviewed:  PCP Hospital f/u appt confirmed? Yes  Scheduled to see Pricilla Holm, MD on 09/22/2022 @ 8:40 pm. [facility] f/u appt confirmed? No   Are transportation arrangements needed? No  If their condition worsens, is the pt aware to call PCP or go to the Emergency Dept.? Yes Was the patient provided with contact information for the PCP's office or ED? Yes Was to pt encouraged to call back with questions or concerns? Yes

## 2022-09-18 NOTE — Telephone Encounter (Signed)
Filled by other provider. Please advise 

## 2022-09-20 ENCOUNTER — Other Ambulatory Visit: Payer: Self-pay | Admitting: Internal Medicine

## 2022-09-22 ENCOUNTER — Encounter: Payer: Self-pay | Admitting: Internal Medicine

## 2022-09-22 ENCOUNTER — Telehealth: Payer: Self-pay

## 2022-09-22 ENCOUNTER — Ambulatory Visit (INDEPENDENT_AMBULATORY_CARE_PROVIDER_SITE_OTHER): Payer: Medicare Other | Admitting: Internal Medicine

## 2022-09-22 VITALS — BP 140/90 | HR 96 | Temp 98.4°F | Ht 60.0 in | Wt 114.0 lb

## 2022-09-22 DIAGNOSIS — N184 Chronic kidney disease, stage 4 (severe): Secondary | ICD-10-CM | POA: Diagnosis not present

## 2022-09-22 DIAGNOSIS — I129 Hypertensive chronic kidney disease with stage 1 through stage 4 chronic kidney disease, or unspecified chronic kidney disease: Secondary | ICD-10-CM

## 2022-09-22 DIAGNOSIS — D631 Anemia in chronic kidney disease: Secondary | ICD-10-CM | POA: Diagnosis not present

## 2022-09-22 DIAGNOSIS — J449 Chronic obstructive pulmonary disease, unspecified: Secondary | ICD-10-CM

## 2022-09-22 DIAGNOSIS — I10 Essential (primary) hypertension: Secondary | ICD-10-CM | POA: Diagnosis not present

## 2022-09-22 DIAGNOSIS — N185 Chronic kidney disease, stage 5: Secondary | ICD-10-CM

## 2022-09-22 LAB — CBC
HCT: 25.9 % — ABNORMAL LOW (ref 36.0–46.0)
Hemoglobin: 8.6 g/dL — ABNORMAL LOW (ref 12.0–15.0)
MCHC: 33.1 g/dL (ref 30.0–36.0)
MCV: 84.8 fl (ref 78.0–100.0)
Platelets: 228 10*3/uL (ref 150.0–400.0)
RBC: 3.06 Mil/uL — ABNORMAL LOW (ref 3.87–5.11)
RDW: 17 % — ABNORMAL HIGH (ref 11.5–15.5)
WBC: 5.7 10*3/uL (ref 4.0–10.5)

## 2022-09-22 LAB — RENAL FUNCTION PANEL
Albumin: 3.1 g/dL — ABNORMAL LOW (ref 3.5–5.2)
BUN: 41 mg/dL — ABNORMAL HIGH (ref 6–23)
CO2: 23 mEq/L (ref 19–32)
Calcium: 8.9 mg/dL (ref 8.4–10.5)
Chloride: 107 mEq/L (ref 96–112)
Creatinine, Ser: 4.25 mg/dL — ABNORMAL HIGH (ref 0.40–1.20)
GFR: 9.62 mL/min — CL (ref >60.00)
Glucose, Bld: 100 mg/dL — ABNORMAL HIGH (ref 70–99)
Phosphorus: 5.1 mg/dL — ABNORMAL HIGH (ref 2.3–4.6)
Potassium: 4.7 mEq/L (ref 3.5–5.1)
Sodium: 140 mEq/L (ref 135–145)

## 2022-09-22 NOTE — Progress Notes (Signed)
   Subjective:   Patient ID: [name], female    DOB: [birth year], 76 y.o.   MRN: [id number]  HPI The patient is a 76 YO female coming in for hospital follow up. Admitted with severe hypertension and renal dysfunction.   PMH, [facility], social history reviewed and updated  Review of Systems  Constitutional:  Positive for activity change, appetite change and fatigue. Negative for chills, fever and unexpected weight change.  Respiratory: Negative.    Cardiovascular: Negative.   Gastrointestinal: Negative.   Musculoskeletal:  Positive for arthralgias, back pain, gait problem and myalgias. Negative for joint swelling.  Skin: Negative.   Neurological:  Positive for weakness.    Objective:  Physical Exam Constitutional:      Appearance: Normal appearance. She is ill-appearing.  HENT:     Head: Normocephalic.  Cardiovascular:     Rate and Rhythm: Normal rate and regular rhythm.  Pulmonary:     Effort: Pulmonary effort is normal.     Breath sounds: No wheezing.     Comments: Trach in place Chest:     Chest wall: No tenderness.  Musculoskeletal:        General: Tenderness present. Normal range of motion.  Skin:    General: Skin is warm and dry.  Neurological:     General: No focal deficit present.     Mental Status: She is alert and oriented to person, place, and time.     Coordination: Coordination abnormal.     Vitals:   09/22/22 0858  BP: (!) 140/90  Pulse: 96  Temp: 98.4 F (36.9 C)  TempSrc: Oral  SpO2: (!) 86%  Weight: 114 lb (51.7 kg)  Height: 5' (1.524 m)    Assessment & Plan:

## 2022-09-22 NOTE — Telephone Encounter (Signed)
Stable from before no change needed. Result note done.

## 2022-09-22 NOTE — Patient Instructions (Signed)
We will check the labs and I have reached out to [facility] to work on getting you in there.

## 2022-09-25 NOTE — Assessment & Plan Note (Signed)
Bleeding episode in the hospital due to suctioning. She has not had bleeding since being home.

## 2022-09-25 NOTE — Assessment & Plan Note (Signed)
In hospital CKD stage 5 consistently. She is having severe hypertension and at goal today. She has not been to nephrologist recently. We are working with nephrology to get her back in with them. She saw them in the hospital and recommended two week follow up. Checking renal function today for stability.

## 2022-09-25 NOTE — Assessment & Plan Note (Signed)
Checking CBC for stability given bleeding episode in the hospital.

## 2022-09-25 NOTE — Assessment & Plan Note (Signed)
BP at goal today with medication changes in hospital. Taking coreg 25 mg BID and clonidine 0.1 mg/day patch and hydralazine 100 mg TID and imdur 60 mg BID minidoxil 2.5 mg BID. Continue and checking renal function today.

## 2022-09-29 DIAGNOSIS — I4891 Unspecified atrial fibrillation: Secondary | ICD-10-CM | POA: Diagnosis not present

## 2022-09-29 DIAGNOSIS — N2581 Secondary hyperparathyroidism of renal origin: Secondary | ICD-10-CM | POA: Diagnosis not present

## 2022-09-29 DIAGNOSIS — E872 Acidosis, unspecified: Secondary | ICD-10-CM | POA: Diagnosis not present

## 2022-09-29 DIAGNOSIS — Z7189 Other specified counseling: Secondary | ICD-10-CM | POA: Diagnosis not present

## 2022-09-29 DIAGNOSIS — I120 Hypertensive chronic kidney disease with stage 5 chronic kidney disease or end stage renal disease: Secondary | ICD-10-CM | POA: Diagnosis not present

## 2022-09-29 DIAGNOSIS — N179 Acute kidney failure, unspecified: Secondary | ICD-10-CM | POA: Diagnosis not present

## 2022-09-29 DIAGNOSIS — D631 Anemia in chronic kidney disease: Secondary | ICD-10-CM | POA: Diagnosis not present

## 2022-09-29 DIAGNOSIS — C140 Malignant neoplasm of pharynx, unspecified: Secondary | ICD-10-CM | POA: Diagnosis not present

## 2022-09-29 DIAGNOSIS — N185 Chronic kidney disease, stage 5: Secondary | ICD-10-CM | POA: Diagnosis not present

## 2022-09-29 LAB — CBC AND DIFFERENTIAL
HCT: 28 — AB (ref 36–46)
Hemoglobin: 8.8 — AB (ref 12.0–16.0)
Neutrophils Absolute: 5.3
Platelets: 239 10*3/uL (ref 150–400)
WBC: 7.2

## 2022-09-29 LAB — CBC: RBC: 3.25 — AB (ref 3.87–5.11)

## 2022-09-29 LAB — BASIC METABOLIC PANEL
BUN: 48 — AB (ref 4–21)
Creatinine: 4.2 — AB (ref <=1.1)
Glucose: 108

## 2022-09-29 LAB — COMPREHENSIVE METABOLIC PANEL
Calcium: 8.6 — AB (ref 8.7–10.7)
eGFR: 10

## 2022-10-09 ENCOUNTER — Other Ambulatory Visit (HOSPITAL_COMMUNITY): Payer: Self-pay | Admitting: *Deleted

## 2022-10-12 ENCOUNTER — Ambulatory Visit (HOSPITAL_COMMUNITY)
Admission: RE | Admit: 2022-10-12 | Discharge: 2022-10-12 | Disposition: A | Discharge status: Home / Self Care | Payer: Medicare Other | Source: Ambulatory Visit | Attending: Nephrology | Admitting: Nephrology

## 2022-10-12 DIAGNOSIS — D631 Anemia in chronic kidney disease: Secondary | ICD-10-CM | POA: Diagnosis not present

## 2022-10-12 DIAGNOSIS — N189 Chronic kidney disease, unspecified: Secondary | ICD-10-CM | POA: Diagnosis not present

## 2022-10-12 MED ORDER — SODIUM CHLORIDE 0.9 % IV SOLN
510.0000 mg | INTRAVENOUS | Status: DC
  Administered 2022-10-12: 510 mg via INTRAVENOUS
  Filled 2022-10-12: qty 17

## 2022-10-19 ENCOUNTER — Encounter (HOSPITAL_COMMUNITY)
Admission: RE | Admit: 2022-10-19 | Discharge: 2022-10-19 | Disposition: A | Discharge status: Home / Self Care | Payer: Medicare Other | Source: Ambulatory Visit | Attending: Nephrology | Admitting: Nephrology

## 2022-10-19 DIAGNOSIS — N189 Chronic kidney disease, unspecified: Secondary | ICD-10-CM | POA: Insufficient documentation

## 2022-10-19 DIAGNOSIS — D631 Anemia in chronic kidney disease: Secondary | ICD-10-CM | POA: Diagnosis not present

## 2022-10-19 MED ORDER — SODIUM CHLORIDE 0.9 % IV SOLN
510.0000 mg | INTRAVENOUS | Status: AC
  Administered 2022-10-19: 510 mg via INTRAVENOUS
  Filled 2022-10-19: qty 510

## 2022-10-20 ENCOUNTER — Encounter: Payer: Self-pay | Admitting: Nurse Practitioner

## 2022-10-20 ENCOUNTER — Telehealth: Payer: Self-pay

## 2022-10-20 ENCOUNTER — Other Ambulatory Visit (INDEPENDENT_AMBULATORY_CARE_PROVIDER_SITE_OTHER): Payer: Medicare Other

## 2022-10-20 ENCOUNTER — Ambulatory Visit (INDEPENDENT_AMBULATORY_CARE_PROVIDER_SITE_OTHER): Payer: Medicare Other | Admitting: Nurse Practitioner

## 2022-10-20 VITALS — BP 138/72 | HR 85 | Temp 97.6°F | Resp 18 | Wt 122.4 lb

## 2022-10-20 DIAGNOSIS — E038 Other specified hypothyroidism: Secondary | ICD-10-CM

## 2022-10-20 DIAGNOSIS — H1032 Unspecified acute conjunctivitis, left eye: Secondary | ICD-10-CM

## 2022-10-20 DIAGNOSIS — R600 Localized edema: Secondary | ICD-10-CM

## 2022-10-20 LAB — RENAL FUNCTION PANEL
Albumin: 3.6 g/dL (ref 3.5–5.2)
BUN: 48 mg/dL — ABNORMAL HIGH (ref 6–23)
CO2: 24 mEq/L (ref 19–32)
Calcium: 8.8 mg/dL (ref 8.4–10.5)
Chloride: 101 mEq/L (ref 96–112)
Creatinine, Ser: 3.95 mg/dL — ABNORMAL HIGH (ref 0.40–1.20)
GFR: 10.5 mL/min — CL (ref >60.00)
Glucose, Bld: 145 mg/dL — ABNORMAL HIGH (ref 70–99)
Phosphorus: 5.2 mg/dL — ABNORMAL HIGH (ref 2.3–4.6)
Potassium: 4.3 mEq/L (ref 3.5–5.1)
Sodium: 137 mEq/L (ref 135–145)

## 2022-10-20 LAB — BRAIN NATRIURETIC PEPTIDE: Pro B Natriuretic peptide (BNP): 106 pg/mL — ABNORMAL HIGH (ref 0.0–100.0)

## 2022-10-20 LAB — T4, FREE: Free T4: 0.86 ng/dL (ref 0.60–1.60)

## 2022-10-20 LAB — T3, FREE: T3, Free: 2.9 pg/mL (ref 2.3–4.2)

## 2022-10-20 LAB — TSH: TSH: 5.55 u[IU]/mL — ABNORMAL HIGH (ref 0.35–5.50)

## 2022-10-20 MED ORDER — LEVOTHYROXINE SODIUM 50 MCG PO TABS: 50.0000 ug | ORAL_TABLET | Freq: Every day | ORAL | 0 refills | Status: DC

## 2022-10-20 MED ORDER — MOXIFLOXACIN HCL 0.5 % OP SOLN: 1.0000 [drp] | Freq: Three times a day (TID) | OPHTHALMIC | 0 refills | Status: DC

## 2022-10-20 NOTE — Assessment & Plan Note (Signed)
Bilateral periorbital edema (bilateral lower lids), waxing and waning x 65month.  No periorbital erythema, no fever Worse in AM No change in vision (chronic bilateral bilateral blurry vision, no eye exam in several years).  Repeat TSH: elevated Jessica: daughter in law reports she has been on levothyroxine 530m. Advised to resume medication, new rx sent, take med on empty stomach with water and 3036m from other medications. F/up with pcp in 6-8weeks

## 2022-10-20 NOTE — Patient Instructions (Signed)
Start eye drops Go to lab

## 2022-10-20 NOTE — Progress Notes (Signed)
Established Patient Visit  Patient: [name]   DOB: Jan 16, 1946   76 y.o. Female  MRN: [id number] Visit Date: 10/20/2022  Subjective:    Chief Complaint  Patient presents with   Acute Visit    Swollen face, Right eye is worse, sticks shut-- pt has tried otc pink eye medication no alleviation. Onset x 2 weeks    HPI Accompanied by daughter in law Ms. Twanna presents with left eye redness and drainage x 2weeks. Associated with itchy sensation and pain. She also reports face swelling x 4weeks, worse in morning then improves. Denies any cough, SOB or LE edema Hx of CHF, tracheostomy dependent on room air, CKD-5 States last appt with [place] [facility] 2weeks ago, iron infusion administered. No ACE or ARB used. No new medication since hospital discharge 9month ago  Hypothyroidism Bilateral periorbital edema (bilateral lower lids), waxing and waning x 245month  No periorbital erythema, no fever Worse in AM No change in vision (chronic bilateral bilateral blurry vision, no eye exam in several years).  Repeat TSH: elevated Jessica: daughter in law reports she has been on levothyroxine 5010m Advised to resume medication, new rx sent, take med on empty stomach with water and 64m77mfrom other medications. F/up with pcp in 6-8weeks  Wt Readings from Last 3 Encounters:  10/20/22 122 lb 6.4 oz (55.5 kg)  10/19/22 115 lb (52.2 kg)  09/22/22 114 lb (51.7 kg)    Wt Readings from Last 3 Encounters:  10/20/22 122 lb 6.4 oz (55.5 kg)  10/19/22 115 lb (52.2 kg)  09/22/22 114 lb (51.7 kg)    Reviewed medical, surgical, and social history today  Medications: Outpatient Medications Prior to Visit  Medication Sig   acetaminophen (TYLENOL) 500 MG tablet Take 1,000 mg by mouth as needed for headache.   apixaban (ELIQUIS) 2.5 MG TABS tablet Take 1 tablet (2.5 mg total) by mouth 2 (two) times daily.   carvedilol (COREG) 25 MG tablet TAKE 1 TABLET (25 MG TOTAL) BY MOUTH  TWICE A DAY WITH MEALS (Patient taking differently: Take 25 mg by mouth 2 (two) times daily with a meal.)   cilostazol (PLETAL) 100 MG tablet TAKE 1 TABLET BY MOUTH TWICE A DAY (Patient taking differently: Take 100 mg by mouth 2 (two) times daily.)   cloNIDine (CATAPRES - DOSED IN MG/24 HR) 0.1 mg/24hr patch Place 1 patch (0.1 mg total) onto the skin once a week.   cloNIDine (CATAPRES) 0.1 MG tablet Take 1 tablet (0.1 mg total) by mouth 4 (four) times daily as needed (for SBP > 180 or DBP > 100).   DULoxetine (CYMBALTA) 20 MG capsule TAKE 1 CAPSULE BY MOUTH EVERY DAY (Patient taking differently: Take 20 mg by mouth daily.)   hydrALAZINE (APRESOLINE) 100 MG tablet Take 1 tablet (100 mg total) by mouth 3 (three) times daily.   loperamide (IMODIUM A-D) 2 MG tablet Take 2 mg by mouth as needed for diarrhea or loose stools.   LORazepam (ATIVAN) 1 MG tablet Take 1 tablet (1 mg total) by mouth 2 (two) times daily as needed for anxiety.   mirtazapine (REMERON) 15 MG tablet TAKE 1 TABLET BY MOUTH EVERYDAY AT BEDTIME   ondansetron (ZOFRAN) 4 MG tablet TAKE 1 TABLET BY MOUTH EVERY 8 HOURS AS NEEDED FOR NAUSEA AND VOMITING (Patient taking differently: Take 4 mg by mouth as needed for nausea or vomiting.)   rosuvastatin (CRESTOR) 20 MG tablet  TAKE 1 TABLET (20 MG TOTAL) BY MOUTH DAILY. (Patient taking differently: Take 20 mg by mouth at bedtime.)   sodium bicarbonate 650 MG tablet Take 650 mg by mouth 3 (three) times daily.   [DISCONTINUED] levothyroxine (SYNTHROID) 50 MCG tablet TAKE 1 TABLET BY MOUTH EVERY DAY BEFORE BREAKFAST (Patient taking differently: Take 50 mcg by mouth daily before breakfast.)   Ensure (ENSURE) Take 237 mLs by mouth daily. (Patient not taking: Reported on 10/20/2022)   fluticasone (FLONASE) 50 MCG/ACT nasal spray SPRAY 2 SPRAYS INTO EACH NOSTRIL EVERY DAY (Patient not taking: Reported on 10/20/2022)   isosorbide mononitrate (IMDUR) 60 MG 24 hr tablet Take 1 tablet (60 mg total) by mouth 2  (two) times daily.   minoxidil (LONITEN) 2.5 MG tablet Take 1 tablet (2.5 mg total) by mouth 2 (two) times daily.   pantoprazole (PROTONIX) 40 MG tablet Take 1 tablet (40 mg total) by mouth daily.   No facility-administered medications prior to visit.   Reviewed past medical and social history.   ROS per HPI above      Objective:  BP 138/72 (BP Location: Right Arm, Patient Position: Sitting, Cuff Size: Normal)   Pulse 85   Temp 97.6 F (36.4 C) (Temporal)   Resp 18   Wt 122 lb 6.4 oz (55.5 kg)   SpO2 96%   BMI 23.90 kg/m      Physical Exam Eyes:     General:        Right eye: No foreign body, discharge or hordeolum.        Left eye: Discharge present.No foreign body or hordeolum.     Extraocular Movements: Extraocular movements intact.     Conjunctiva/sclera:     Right eye: Right conjunctiva is not injected. No chemosis, exudate or hemorrhage.    Left eye: Left conjunctiva is not injected. Chemosis present. No exudate or hemorrhage.    Comments: Edema on bilateral lower lids, no erythema, no sinus tenderness.  Cardiovascular:     Rate and Rhythm: Normal rate.     Pulses: Normal pulses.  Pulmonary:     Effort: Pulmonary effort is normal.  Musculoskeletal:     Right lower leg: No edema.     Left lower leg: No edema.  Neurological:     Mental Status: She is alert and oriented to person, place, and time.     Results for orders placed or performed in visit on 10/20/22  TSH  Result Value Ref Range   TSH 5.55 (H) 0.35 - 5.50 uIU/mL  B Nat Peptide  Result Value Ref Range   Pro B Natriuretic peptide (BNP) 106.0 (H) 0.0 - 100.0 pg/mL  Renal Function Panel  Result Value Ref Range   Sodium 137 135 - 145 mEq/L   Potassium 4.3 3.5 - 5.1 mEq/L   Chloride 101 96 - 112 mEq/L   CO2 24 19 - 32 mEq/L   Albumin 3.6 3.5 - 5.2 g/dL   BUN 48 (H) 6 - 23 mg/dL   Creatinine, Ser 3.95 (H) 0.40 - 1.20 mg/dL   Glucose, Bld 145 (H) 70 - 99 mg/dL   Phosphorus 5.2 (H) 2.3 - 4.6 mg/dL    GFR 10.50 (LL) >60.00 mL/min   Calcium 8.8 8.4 - 10.5 mg/dL      Assessment & Plan:    Problem List Items Addressed This Visit       Endocrine   Hypothyroidism    Bilateral periorbital edema (bilateral lower lids), waxing and waning x 25month.  No periorbital erythema, no fever Worse in AM No change in vision (chronic bilateral bilateral blurry vision, no eye exam in several years).  Repeat TSH: elevated Jessica: daughter in law reports she has been on levothyroxine 72mg. Advised to resume medication, new rx sent, take med on empty stomach with water and 348ms from other medications. F/up with pcp in 6-8weeks      Relevant Medications   levothyroxine (SYNTHROID) 50 MCG tablet   Other Relevant Orders   TSH (Completed)   T3, free   T4, free   Other Visit Diagnoses     Periorbital edema of both eyes    -  Primary   Relevant Orders   TSH (Completed)   B Nat Peptide (Completed)   Renal Function Panel (Completed)   Acute bacterial conjunctivitis of left eye       Relevant Medications   moxifloxacin (VIGAMOX) 0.5 % ophthalmic solution      Return for f/up with Dr. CrSharlet Salinan [facility]    ChWilfred LacyNP

## 2022-10-21 NOTE — Progress Notes (Signed)
T3, Free and T4, Free were added on 10/20/2022 and has been resulted on 10/21/2022.

## 2022-10-23 ENCOUNTER — Ambulatory Visit (INDEPENDENT_AMBULATORY_CARE_PROVIDER_SITE_OTHER): Payer: Medicare Other

## 2022-10-23 ENCOUNTER — Encounter: Payer: Self-pay | Admitting: Internal Medicine

## 2022-10-23 ENCOUNTER — Ambulatory Visit (INDEPENDENT_AMBULATORY_CARE_PROVIDER_SITE_OTHER): Payer: Medicare Other | Admitting: Internal Medicine

## 2022-10-23 VITALS — BP 180/70 | HR 81 | Temp 98.2°F | Ht 61.0 in | Wt 121.0 lb

## 2022-10-23 DIAGNOSIS — I10 Essential (primary) hypertension: Secondary | ICD-10-CM | POA: Diagnosis not present

## 2022-10-23 DIAGNOSIS — Z0000 Encounter for general adult medical examination without abnormal findings: Secondary | ICD-10-CM

## 2022-10-23 DIAGNOSIS — R220 Localized swelling, mass and lump, head: Secondary | ICD-10-CM | POA: Diagnosis not present

## 2022-10-23 DIAGNOSIS — G894 Chronic pain syndrome: Secondary | ICD-10-CM

## 2022-10-23 DIAGNOSIS — K591 Functional diarrhea: Secondary | ICD-10-CM | POA: Diagnosis not present

## 2022-10-23 DIAGNOSIS — H9193 Unspecified hearing loss, bilateral: Secondary | ICD-10-CM

## 2022-10-23 DIAGNOSIS — E039 Hypothyroidism, unspecified: Secondary | ICD-10-CM | POA: Diagnosis not present

## 2022-10-23 DIAGNOSIS — Z23 Encounter for immunization: Secondary | ICD-10-CM | POA: Diagnosis not present

## 2022-10-23 MED ORDER — PANTOPRAZOLE SODIUM 40 MG PO TBEC: 40.0000 mg | DELAYED_RELEASE_TABLET | Freq: Every day | ORAL | 3 refills | Status: DC

## 2022-10-23 MED ORDER — DIPHENOXYLATE-ATROPINE 2.5-0.025 MG PO TABS: 1.0000 | ORAL_TABLET | Freq: Every day | ORAL | 0 refills | Status: DC | PRN

## 2022-10-23 MED ORDER — METHYLPREDNISOLONE ACETATE 40 MG/ML IJ SUSP
40.0000 mg | Freq: Once | INTRAMUSCULAR | Status: AC
  Administered 2022-10-23: 40 mg via INTRAMUSCULAR

## 2022-10-23 MED ORDER — MINOXIDIL 2.5 MG PO TABS: 2.5000 mg | ORAL_TABLET | Freq: Two times a day (BID) | ORAL | 3 refills | Status: DC

## 2022-10-23 NOTE — Progress Notes (Signed)
Patient received depo 40 today and responded well.

## 2022-10-23 NOTE — Assessment & Plan Note (Signed)
She has changed dose needs repeat levels in 6-8 weeks.

## 2022-10-23 NOTE — Patient Instructions (Signed)
[name] , Thank you for taking time to come for your Medicare Wellness Visit. I appreciate your ongoing commitment to your health goals. Please review the following plan we discussed and let me know if I can assist you in the future.   These are the goals we discussed:  Goals      Client understands the importance of follow-up with providers by attending scheduled visits        This is a list of the screening recommended for you and due dates:  Health Maintenance  Topic Date Due   Hepatitis C Screening: USPSTF Recommendation to screen - Ages 61-79 yo.  Never done   COVID-19 Vaccine (5 - Mixed Product risk series) 11/19/2020   Flu Shot  07/14/2022   Medicare Annual Wellness Visit  10/24/2023   Tetanus Vaccine  12/29/2027   Pneumonia Vaccine  Completed   DEXA scan (bone density measurement)  Completed   HPV Vaccine  Aged Out   Zoster (Shingles) Vaccine  Discontinued    Advanced directives: No  Conditions/risks identified: Yes  Next appointment: Follow up in one year for your annual wellness visit.   Preventive Care 53 Years and Older, Female Preventive care refers to lifestyle choices and visits with your health care provider that can promote health and wellness. What does preventive care include? A yearly physical exam. This is also called an annual well check. Dental exams once or twice a year. Routine eye exams. Ask your health care provider how often you should have your eyes checked. Personal lifestyle choices, including: Daily care of your teeth and gums. Regular physical activity. Eating a healthy diet. Avoiding tobacco and drug use. Limiting alcohol use. Practicing safe sex. Taking low-dose aspirin every day. Taking vitamin and mineral supplements as recommended by your health care provider. What happens during an annual well check? The services and screenings done by your health care provider during your annual well check will depend on your age, overall  health, lifestyle risk factors, and family history of disease. Counseling  Your health care provider may ask you questions about your: Alcohol use. Tobacco use. Drug use. Emotional well-being. Home and relationship well-being. Sexual activity. Eating habits. History of falls. Memory and ability to understand (cognition). Work and work Statistician. Reproductive health. Screening  You may have the following tests or measurements: Height, weight, and BMI. Blood pressure. Lipid and cholesterol levels. These may be checked every 5 years, or more frequently if you are over 70 years old. Skin check. Lung cancer screening. You may have this screening every year starting at age 58 if you have a 30-pack-year history of smoking and currently smoke or have quit within the past 15 years. Fecal occult blood test (FOBT) of the stool. You may have this test every year starting at age 56. Flexible sigmoidoscopy or colonoscopy. You may have a sigmoidoscopy every 5 years or a colonoscopy every 10 years starting at age 64. Hepatitis C blood test. Hepatitis B blood test. Sexually transmitted disease (STD) testing. Diabetes screening. This is done by checking your blood sugar (glucose) after you have not eaten for a while (fasting). You may have this done every 1-3 years. Bone density scan. This is done to screen for osteoporosis. You may have this done starting at age 91. Mammogram. This may be done every 1-2 years. Talk to your health care provider about how often you should have regular mammograms. Talk with your health care provider about your test results, treatment options, and if  necessary, the need for more tests. Vaccines  Your health care provider may recommend certain vaccines, such as: Influenza vaccine. This is recommended every year. Tetanus, diphtheria, and acellular pertussis (Tdap, Td) vaccine. You may need a Td booster every 10 years. Zoster vaccine. You may need this after age  63. Pneumococcal 13-valent conjugate (PCV13) vaccine. One dose is recommended after age 27. Pneumococcal polysaccharide (PPSV23) vaccine. One dose is recommended after age 12. Talk to your health care provider about which screenings and vaccines you need and how often you need them. This information is not intended to replace advice given to you by your health care provider. Make sure you discuss any questions you have with your health care provider. Document Released: 12/27/2015 Document Revised: 08/19/2016 Document Reviewed: 10/01/2015 Elsevier Interactive Patient Education  2017 [place] Prevention in the Home Falls can cause injuries. They can happen to people of all ages. There are many things you can do to make your home safe and to help prevent falls. What can I do on the outside of my home? Regularly fix the edges of walkways and driveways and fix any cracks. Remove anything that might make you trip as you walk through a door, such as a raised step or threshold. Trim any bushes or trees on the path to your home. Use bright outdoor lighting. Clear any walking paths of anything that might make someone trip, such as rocks or tools. Regularly check to see if handrails are loose or broken. Make sure that both sides of any steps have handrails. Any raised decks and porches should have guardrails on the edges. Have any leaves, snow, or ice cleared regularly. Use sand or salt on walking paths during winter. Clean up any spills in your garage right away. This includes oil or grease spills. What can I do in the bathroom? Use night lights. Install grab bars by the toilet and in the tub and shower. Do not use towel bars as grab bars. Use non-skid mats or decals in the tub or shower. If you need to sit down in the shower, use a plastic, non-slip stool. Keep the floor dry. Clean up any water that spills on the floor as soon as it happens. Remove soap buildup in the tub or shower  regularly. Attach bath mats securely with double-sided non-slip rug tape. Do not have throw rugs and other things on the floor that can make you trip. What can I do in the bedroom? Use night lights. Make sure that you have a light by your bed that is easy to reach. Do not use any sheets or blankets that are too big for your bed. They should not hang down onto the floor. Have a firm chair that has side arms. You can use this for support while you get dressed. Do not have throw rugs and other things on the floor that can make you trip. What can I do in the kitchen? Clean up any spills right away. Avoid walking on wet floors. Keep items that you use a lot in easy-to-reach places. If you need to reach something above you, use a strong step stool that has a grab bar. Keep electrical cords out of the way. Do not use floor polish or wax that makes floors slippery. If you must use wax, use non-skid floor wax. Do not have throw rugs and other things on the floor that can make you trip. What can I do with my stairs? Do not leave any items  on the stairs. Make sure that there are handrails on both sides of the stairs and use them. Fix handrails that are broken or loose. Make sure that handrails are as long as the stairways. Check any carpeting to make sure that it is firmly attached to the stairs. Fix any carpet that is loose or worn. Avoid having throw rugs at the top or bottom of the stairs. If you do have throw rugs, attach them to the floor with carpet tape. Make sure that you have a light switch at the top of the stairs and the bottom of the stairs. If you do not have them, ask someone to add them for you. What else can I do to help prevent falls? Wear shoes that: Do not have high heels. Have rubber bottoms. Are comfortable and fit you well. Are closed at the toe. Do not wear sandals. If you use a stepladder: Make sure that it is fully opened. Do not climb a closed stepladder. Make sure that  both sides of the stepladder are locked into place. Ask someone to hold it for you, if possible. Clearly mark and make sure that you can see: Any grab bars or handrails. First and last steps. Where the edge of each step is. Use tools that help you move around (mobility aids) if they are needed. These include: Canes. Walkers. Scooters. Crutches. Turn on the lights when you go into a dark area. Replace any light bulbs as soon as they burn out. Set up your furniture so you have a clear path. Avoid moving your furniture around. If any of your floors are uneven, fix them. If there are any pets around you, be aware of where they are. Review your medicines with your doctor. Some medicines can make you feel dizzy. This can increase your chance of falling. Ask your doctor what other things that you can do to help prevent falls. This information is not intended to replace advice given to you by your health care provider. Make sure you discuss any questions you have with your health care provider. Document Released: 09/26/2009 Document Revised: 05/07/2016 Document Reviewed: 01/04/2015 Elsevier Interactive Patient Education  2017 [employer].

## 2022-10-23 NOTE — Assessment & Plan Note (Signed)
Given depo-medrol 40 mg IM for pain in low back and legs which has worked well in the past.

## 2022-10-23 NOTE — Assessment & Plan Note (Signed)
She has had lately. Rx lomotil to use and monitor. If no improvement they will let us know.

## 2022-10-23 NOTE — Patient Instructions (Addendum)
We will ask the kidney doctor about the prolia.   We have given you the shot today for the pain which may also help the eyes.

## 2022-10-23 NOTE — Progress Notes (Signed)
   Subjective:   Patient ID: [name], female    DOB: 1946-07-14, 76 y.o.   MRN: [id number]  HPI The patient is a 76 YO female coming in for concerns about ongoing eyelid swelling which is waxing and waning. Her thyroid medicine was slightly increased about 3-4 days ago. No difference.   Review of Systems  Constitutional:  Positive for fatigue.  HENT: Negative.         Eyelid swelling  Eyes: Negative.   Respiratory:  Positive for cough. Negative for chest tightness and shortness of breath.   Cardiovascular:  Negative for chest pain, palpitations and leg swelling.  Gastrointestinal:  Negative for abdominal distention, abdominal pain, constipation, diarrhea, nausea and vomiting.  Musculoskeletal:  Positive for arthralgias, gait problem and myalgias.  Skin: Negative.   Psychiatric/Behavioral: Negative.      Objective:  Physical Exam Constitutional:      Appearance: Normal appearance.  HENT:     Head: Normocephalic.     Comments: Eyelid lower swelling bilateral left more than right Cardiovascular:     Rate and Rhythm: Normal rate and regular rhythm.  Pulmonary:     Effort: Pulmonary effort is normal.     Breath sounds: Rhonchi present.     Comments: Trach in place, stable rhonchi Musculoskeletal:        General: Normal range of motion.  Skin:    General: Skin is warm and dry.  Neurological:     General: No focal deficit present.     Mental Status: She is alert and oriented to person, place, and time.     Vitals:   10/23/22 0908 10/23/22 0914  BP: (!) 180/70 (!) 180/70  Pulse: 81   Temp: 98.2 F (36.8 C)   TempSrc: Oral   SpO2: 92%   Weight: 121 lb (54.9 kg)   Height: '5\' 1"'$  (1.549 m)     Assessment & Plan:  Depo-medrol 40 mg IM given at visit, flu shot given at visit

## 2022-10-23 NOTE — Assessment & Plan Note (Signed)
BP is elevated due to not taking BP meds this morning but overall at goal at home. They are monitoring. They will take meds when they get home. Continue coreg 25 mg BID and clonidine 0.1 mg daily patch and hydralazine 100 mg TID and imdur 60 mg BID and minodoxil 2.5 mg BID.

## 2022-10-23 NOTE — Assessment & Plan Note (Signed)
Unclear etiology. Recent T4 normal with TSH 5.5. It is not impossible that this could be contributing. She has made dose change and will recheck levels in 6-8 weeks. Given depo-medrol 40 mg IM in case of allergic reaction and to treat her pain.

## 2022-10-23 NOTE — Progress Notes (Addendum)
Subjective:   [name] is a 76 y.o. female who presents for an Initial Medicare Annual Wellness Visit.  Review of Systems     Cardiac Risk Factors include: advanced age (>61mn, >>104women);dyslipidemia;hypertension;sedentary lifestyle     Objective:    Today's Vitals   10/23/22 0945  BP: (!) 180/70  Pulse: 81  Temp: 98.2 F (36.8 C)  SpO2: 92%  Weight: 121 lb (54.9 kg)  Height: '5\' 1"'$  (1.549 m)   Body mass index is 22.86 kg/m.     10/23/2022    9:53 AM 09/03/2022   10:00 AM 08/06/2022    4:57 PM 03/03/2022    4:33 PM 01/22/2022    1:27 PM 01/01/2022   11:39 PM 07/04/2021    5:51 PM  Advanced Directives  Does Patient Have a Medical Advance Directive? No No No No No No No  Would patient like information on creating a medical advance directive? No - Patient declined No - Patient declined No - Patient declined No - Patient declined  No - Guardian declined     Current Medications (verified) Outpatient Encounter Medications as of 10/23/2022  Medication Sig   acetaminophen (TYLENOL) 500 MG tablet Take 1,000 mg by mouth as needed for headache.   apixaban (ELIQUIS) 2.5 MG TABS tablet Take 1 tablet (2.5 mg total) by mouth 2 (two) times daily.   carvedilol (COREG) 25 MG tablet TAKE 1 TABLET (25 MG TOTAL) BY MOUTH TWICE A DAY WITH MEALS (Patient taking differently: Take 25 mg by mouth 2 (two) times daily with a meal.)   cilostazol (PLETAL) 100 MG tablet TAKE 1 TABLET BY MOUTH TWICE A DAY (Patient taking differently: Take 100 mg by mouth 2 (two) times daily.)   cloNIDine (CATAPRES - DOSED IN MG/24 HR) 0.1 mg/24hr patch Place 1 patch (0.1 mg total) onto the skin once a week.   cloNIDine (CATAPRES) 0.1 MG tablet Take 1 tablet (0.1 mg total) by mouth 4 (four) times daily as needed (for SBP > 180 or DBP > 100).   DULoxetine (CYMBALTA) 20 MG capsule TAKE 1 CAPSULE BY MOUTH EVERY DAY (Patient taking differently: Take 20 mg by mouth daily.)   Ensure (ENSURE) Take 237 mLs by mouth  daily.   fluticasone (FLONASE) 50 MCG/ACT nasal spray SPRAY 2 SPRAYS INTO EACH NOSTRIL EVERY DAY   hydrALAZINE (APRESOLINE) 100 MG tablet Take 1 tablet (100 mg total) by mouth 3 (three) times daily.   isosorbide mononitrate (IMDUR) 60 MG 24 hr tablet Take 1 tablet (60 mg total) by mouth 2 (two) times daily.   levothyroxine (SYNTHROID) 50 MCG tablet Take 1 tablet (50 mcg total) by mouth daily before breakfast. Additional refill from pcp   loperamide (IMODIUM A-D) 2 MG tablet Take 2 mg by mouth as needed for diarrhea or loose stools.   LORazepam (ATIVAN) 1 MG tablet Take 1 tablet (1 mg total) by mouth 2 (two) times daily as needed for anxiety.   minoxidil (LONITEN) 2.5 MG tablet Take 1 tablet (2.5 mg total) by mouth 2 (two) times daily.   mirtazapine (REMERON) 15 MG tablet TAKE 1 TABLET BY MOUTH EVERYDAY AT BEDTIME   moxifloxacin (VIGAMOX) 0.5 % ophthalmic solution Place 1 drop into the left eye 3 (three) times daily.   ondansetron (ZOFRAN) 4 MG tablet TAKE 1 TABLET BY MOUTH EVERY 8 HOURS AS NEEDED FOR NAUSEA AND VOMITING (Patient taking differently: Take 4 mg by mouth as needed for nausea or vomiting.)   pantoprazole (PROTONIX) 40 MG  tablet Take 1 tablet (40 mg total) by mouth daily.   rosuvastatin (CRESTOR) 20 MG tablet TAKE 1 TABLET (20 MG TOTAL) BY MOUTH DAILY. (Patient taking differently: Take 20 mg by mouth at bedtime.)   sodium bicarbonate 650 MG tablet Take 650 mg by mouth 3 (three) times daily.   [DISCONTINUED] levocetirizine (XYZAL) 5 MG tablet TAKE 1 TABLET(5 MG) BY MOUTH EVERY EVENING   No facility-administered encounter medications on file as of 10/23/2022.    Allergies (verified) Xyzal [levocetirizine dihydrochloride], Augmentin [amoxicillin-pot clavulanate], and Tribenzor [olmesartan-amlodipine-hctz]   History: Past Medical History:  Diagnosis Date   Anemia    Anxiety    takes Ativan prn   Blood transfusion without reported diagnosis 09/15/12   2 units Prbc's   Broken ribs     Chronic back pain    CKD (chronic kidney disease), stage IV (HCC)    Constipation    related to pain meds   COPD (chronic obstructive pulmonary disease) ([place]) 08/10/2012   denies   Depression    Gastrostomy in place [facility])    removed   GERD (gastroesophageal reflux disease)    takes Zantac daily   Headache(784.0)    Hiatal hernia 08/10/2012   History of radiation therapy 10/17/12-11/25/12   supraglottic larynx,high risk neck tumor bed 5880 cGy/28 sessions, high risk lymph node tumor bed 5600 cGy/20 sessions, mod risk lymph node tumor bed 5040 cGy/20 sessions   Hypercholesteremia    takes Pravastatin daily   Hypertension    takes Tribenzor and Atenolol daily   Insomnia    takes Amitriptyline daily   Nausea    takes Zofran prn   PAD (peripheral artery disease) ([place])    noninvasive imaging in 2016   Pneumonia    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 08/08/2012   Shortness of breath dyspnea    Stroke [facility]) 2011   denies. no residual   Uterine cancer ([place])    Past Surgical History:  Procedure Laterality Date   ABDOMINAL SURGERY     r/t uterine carcinoma   APPENDECTOMY     DIRECT LARYNGOSCOPY N/A 05/22/2014   Procedure: DIRECT LARYNGOSCOPY WITH ESOPHAGEAL DILATION;  Surgeon: Jerrell Belfast, MD;  [facility] OR;  Service: ENT;  Laterality: N/A;   ESOPHAGOSCOPY WITH DILITATION N/A 05/29/2015   Procedure: ESOPHAGOSCOPY WITH ESOPHAGEAL DILITATION;  Surgeon: Jerrell Belfast, MD;  Location: [place];  Service: ENT;  Laterality: N/A;   Gastrostomy Tube removed   2013   GASTROSTOMY W/ FEEDING TUBE  13   HERNIA REPAIR     child   LARYNGETOMY  08/31/2012   Procedure: LARYNGECTOMY;  Surgeon: Jerrell Belfast, MD;  Location: [place];  Service: ENT;  Laterality: N/A;   LARYNGOSCOPY  08/10/2012   Procedure: LARYNGOSCOPY;  Surgeon: Jerrell Belfast, MD;  Location: [facility];  Service: ENT;  Laterality: N/A;  with biopsy   RADICAL NECK DISSECTION  08/31/2012   Procedure: RADICAL NECK  DISSECTION;  Surgeon: Jerrell Belfast, MD;  Location: [place];  Service: ENT;  Laterality: Bilateral;   TRACHEAL ESOPHOGEAL PUNCTURE WITH REPAIR STOMA N/A 09/08/2013   Procedure: TRACHEAL ESOPHOGEAL PUNCTURE WITH PLACEMENT OF  PROVOX PROSTHESIS ;  Surgeon: Jerrell Belfast, MD;  Location: [place];  Service: ENT;  Laterality: N/A;   TRACHEOSTOMY TUBE PLACEMENT  08/10/2012   Procedure: TRACHEOSTOMY;  Surgeon: Jerrell Belfast, MD;  Location: [facility];  Service: ENT;  Laterality: N/A;   Family History  Problem Relation Age of Onset   Throat cancer Father  Brain cancer Brother    CAD Neg Hx    Social History   Socioeconomic History   Marital status: Widowed    Spouse name: Not on file   Number of children: 4   Years of education: Not on file   Highest education level: Not on file  Occupational History    Comment: retired Regulatory affairs officer  Tobacco Use   Smoking status: Former    Packs/day: 0.25    Years: 50.00    Total pack years: 12.50    Types: Cigarettes    Quit date: 05/27/2013    Years since quitting: 9.4   Smokeless tobacco: Never  Vaping Use   Vaping Use: Never used  Substance and Sexual Activity   Alcohol use: Yes   Drug use: No   Sexual activity: Not Currently  Other Topics Concern   Not on file  Social History Narrative   ** Merged History Encounter **       Social Determinants of Health   Financial Resource Strain: Low Risk  (10/23/2022)   Overall Financial Resource Strain (CARDIA)    Difficulty of Paying Living Expenses: Not hard at all  Food Insecurity: No Food Insecurity (10/23/2022)   Hunger Vital Sign    Worried About Running Out of Food in the Last Year: Never true    Ran Out of Food in the Last Year: Never true  Transportation Needs: No Transportation Needs (10/23/2022)   PRAPARE - Hydrologist (Medical): No    Lack of Transportation (Non-Medical): No  Physical Activity: Inactive (10/23/2022)   Exercise Vital Sign    Days of  Exercise per Week: 0 days    Minutes of Exercise per Session: 0 min  Stress: No Stress Concern Present (10/23/2022)   [place]    Feeling of Stress : Not at all  Social Connections: Socially Isolated (10/23/2022)   Social Connection and Isolation Panel [NHANES]    Frequency of Communication with Friends and Family: Once a week    Frequency of Social Gatherings with Friends and Family: Once a week    Attends Religious Services: More than 4 times per year    Active Member of [employer] or Organizations: No    Attends Archivist Meetings: Never    Marital Status: Widowed    Tobacco Counseling Counseling given: Not Answered   Clinical Intake:  Pre-visit preparation completed: Yes  Pain : No/denies pain     BMI - recorded: 22.86 Nutritional Status: BMI of 19-24  Normal Nutritional Risks: None Diabetes: No  How often do you need to have someone help you when you read instructions, pamphlets, or other written materials from your doctor or pharmacy?: 1 - Never What is the last grade level you completed in school?: HSG  Diabetic? no  Interpreter Needed?: No  Information entered by :: Lisette Abu, LPN.   Activities of Daily Living    10/23/2022    9:57 AM 09/03/2022   12:30 AM  In your present state of health, do you have any difficulty performing the following activities:  Hearing? 1 0  Vision? 1 0  Difficulty concentrating or making decisions? 1 0  Walking or climbing stairs? 1 0  Dressing or bathing? 1 0  Doing errands, shopping? 1 0  Preparing Food and eating ? N   Using the Toilet? N   In the past six months, have you accidently leaked urine? N  Do you have problems with loss of bowel control? N   Managing your Medications? Y   Managing your Finances? Y   Housekeeping or managing your Housekeeping? Y     Patient Care Team: Hoyt Koch, MD as PCP - General (Internal  Medicine) Nahser, Wonda Cheng, MD as PCP - Cardiology (Cardiology) Hoyt Koch, MD (Internal Medicine)  Indicate any recent Medical Services you may have received from other than [facility] providers in the past year (date may be approximate).     Assessment:   This is a routine wellness examination for [facility].  Hearing/Vision screen Hearing Screening - Comments:: Patient has issues with hearing; no hearing aids. Vision Screening - Comments:: Patient wears readers for small print.  No current optometrist.  Dietary issues and exercise activities discussed: Current Exercise Habits: The patient does not participate in regular exercise at present, Exercise limited by: cardiac condition(s);respiratory conditions(s)   Goals Addressed             This Visit's Progress    Client understands the importance of follow-up with providers by attending scheduled visits        Depression Screen    10/23/2022    9:46 AM 10/23/2022    9:19 AM 08/13/2022   10:03 AM 01/26/2022    3:11 PM 07/18/2021   11:22 AM 02/15/2020    3:37 PM 01/19/2019    1:27 PM  PHQ 2/9 Scores  PHQ - 2 Score 0 0 2  0 0 4  PHQ- 9 Score 0 0 '4  5  9  '$ Exception Documentation    Other- indicate reason in comment box       Fall Risk    10/23/2022    9:57 AM 10/23/2022    9:18 AM 10/20/2022    8:31 AM 09/22/2022    8:57 AM 08/13/2022    9:53 AM  Fall Risk   Falls in the past year? 0 0 0  0  Number falls in past yr: 0 0 0 1 0  Injury with Fall? 0 0 0 0 0  Risk for fall due to : No Fall Risks   Impaired balance/gait Impaired balance/gait;History of fall(s)  Follow up Falls prevention discussed Falls evaluation completed  Falls evaluation completed Falls evaluation completed    [place]:  Any stairs in or around the home? Yes  If so, are there any without handrails? No  Home free of loose throw rugs in walkways, pet beds, electrical cords, etc? Yes  Adequate lighting in your home to  reduce risk of falls? Yes   ASSISTIVE DEVICES UTILIZED TO PREVENT FALLS:  Life alert? No  Use of a cane, walker or w/c? No  Grab bars in the bathroom? No  Shower chair or bench in shower? Yes  Elevated toilet seat or a handicapped toilet? No   TIMED UP AND GO:  Was the test performed? Yes .  Length of time to ambulate 10 feet: 12 sec.   Gait slow and steady without use of assistive device  Cognitive Function: Patient declined testing        10/23/2022   10:03 AM  6CIT Screen  What Year? 0 points  What month? 0 points  What time? 0 points  Count back from 20 0 points  Months in reverse 0 points  Repeat phrase 0 points  Total Score 0 points    Immunizations Immunization History  Administered Date(s) Administered   Fluad Quad(high Dose  65+) 01/22/2017, 08/08/2019, 12/20/2020   Influenza, High Dose Seasonal PF 11/20/2014, 10/11/2015, 01/22/2017, 12/28/2017, 01/19/2019   PFIZER(Purple Top)SARS-COV-2 Vaccination 09/03/2020, 09/24/2020   Pneumococcal Conjugate-13 02/01/2015   Pneumococcal Polysaccharide-23 08/10/2012, 01/19/2019   Tdap 12/28/2017   Unspecified SARS-COV-2 Vaccination 09/03/2020, 09/24/2020    TDAP status: Up to date  Flu Vaccine status: Up to date  Pneumococcal vaccine status: Up to date  Covid-19 vaccine status: Completed vaccines  Qualifies for Shingles Vaccine? Yes   Zostavax completed No   Shingrix Completed?: No.    Education has been provided regarding the importance of this vaccine. Patient has been advised to call insurance company to determine out of pocket expense if they have not yet received this vaccine. Advised may also receive vaccine at local pharmacy or Health Dept. Verbalized acceptance and understanding.  Screening Tests Health Maintenance  Topic Date Due   Hepatitis C Screening  Never done   COVID-19 Vaccine (5 - Mixed Product risk series) 11/19/2020   INFLUENZA VACCINE  07/14/2022   Medicare Annual Wellness (AWV)  10/24/2023    TETANUS/TDAP  12/29/2027   Pneumonia Vaccine 74+ Years old  Completed   DEXA SCAN  Completed   HPV VACCINES  Aged Out   Zoster Vaccines- Shingrix  Discontinued    Health Maintenance  Health Maintenance Due  Topic Date Due   Hepatitis C Screening  Never done   COVID-19 Vaccine (5 - Mixed Product risk series) 11/19/2020   INFLUENZA VACCINE  07/14/2022    Colorectal cancer screening: No longer required.   Mammogram status: Completed 03/19/2020. Repeat every year  Bone Density status: Completed 01/25/2019. Results reflect: Bone density results: OSTEOPOROSIS. Repeat every 2 years.  Lung Cancer Screening: (Low Dose CT Chest recommended if Age 72-80 years, 30 pack-year currently smoking OR have quit w/in 15years.) does not qualify.   Lung Cancer Screening Referral: no  Additional Screening:  Hepatitis C Screening: does qualify; Completed no  Vision Screening: Recommended annual ophthalmology exams for early detection of glaucoma and other disorders of the eye. Is the patient up to date with their annual eye exam?  No  Who is the provider or what is the name of the office in which the patient attends annual eye exams? None If pt is not established with a provider, would they like to be referred to a provider to establish care? Yes .   Dental Screening: Recommended annual dental exams for proper oral hygiene  Community Resource Referral / Chronic Care Management: CRR required this visit?  Yes   CCM required this visit?  No      Plan:     I have personally reviewed and noted the following in the patient's chart:   Medical and social history Use of alcohol, tobacco or illicit drugs  Current medications and supplements including opioid prescriptions. Patient is not currently taking opioid prescriptions. Functional ability and status Nutritional status Physical activity Advanced directives List of other physicians Hospitalizations, surgeries, and ER visits in previous 12  months Vitals Screenings to include cognitive, depression, and falls Referrals and appointments  In addition, I have reviewed and discussed with patient certain preventive protocols, quality metrics, and best practice recommendations. A written personalized care plan for preventive services as well as general preventive health recommendations were provided to patient.     Sheral Flow, LPN   [id number]/26/8341   Nurse Notes: N/A

## 2022-10-23 NOTE — Telephone Encounter (Signed)
Critical value   Provoder notified on the date call was made

## 2022-10-28 ENCOUNTER — Other Ambulatory Visit: Payer: Self-pay

## 2022-10-28 ENCOUNTER — Inpatient Hospital Stay (HOSPITAL_COMMUNITY)
Admission: EM | Admit: 2022-10-28 | Discharge: 2022-10-31 | DRG: 281 | Disposition: A | Discharge status: Hospice | Payer: Medicare Other | Attending: Internal Medicine | Admitting: Internal Medicine

## 2022-10-28 ENCOUNTER — Emergency Department (HOSPITAL_COMMUNITY): Payer: Medicare Other

## 2022-10-28 DIAGNOSIS — I739 Peripheral vascular disease, unspecified: Secondary | ICD-10-CM | POA: Diagnosis present

## 2022-10-28 DIAGNOSIS — F32A Depression, unspecified: Secondary | ICD-10-CM | POA: Diagnosis present

## 2022-10-28 DIAGNOSIS — D631 Anemia in chronic kidney disease: Secondary | ICD-10-CM | POA: Diagnosis not present

## 2022-10-28 DIAGNOSIS — Z1152 Encounter for screening for COVID-19: Secondary | ICD-10-CM

## 2022-10-28 DIAGNOSIS — R1314 Dysphagia, pharyngoesophageal phase: Secondary | ICD-10-CM | POA: Diagnosis not present

## 2022-10-28 DIAGNOSIS — J323 Chronic sphenoidal sinusitis: Secondary | ICD-10-CM | POA: Diagnosis not present

## 2022-10-28 DIAGNOSIS — C320 Malignant neoplasm of glottis: Secondary | ICD-10-CM | POA: Diagnosis present

## 2022-10-28 DIAGNOSIS — K219 Gastro-esophageal reflux disease without esophagitis: Secondary | ICD-10-CM | POA: Diagnosis present

## 2022-10-28 DIAGNOSIS — K5900 Constipation, unspecified: Secondary | ICD-10-CM | POA: Diagnosis present

## 2022-10-28 DIAGNOSIS — E7800 Pure hypercholesterolemia, unspecified: Secondary | ICD-10-CM | POA: Diagnosis present

## 2022-10-28 DIAGNOSIS — M549 Dorsalgia, unspecified: Secondary | ICD-10-CM | POA: Diagnosis present

## 2022-10-28 DIAGNOSIS — I700 Atherosclerosis of aorta: Secondary | ICD-10-CM | POA: Diagnosis not present

## 2022-10-28 DIAGNOSIS — Z87891 Personal history of nicotine dependence: Secondary | ICD-10-CM

## 2022-10-28 DIAGNOSIS — I080 Rheumatic disorders of both mitral and aortic valves: Secondary | ICD-10-CM | POA: Diagnosis present

## 2022-10-28 DIAGNOSIS — G4700 Insomnia, unspecified: Secondary | ICD-10-CM | POA: Diagnosis present

## 2022-10-28 DIAGNOSIS — F411 Generalized anxiety disorder: Secondary | ICD-10-CM | POA: Diagnosis present

## 2022-10-28 DIAGNOSIS — Z86718 Personal history of other venous thrombosis and embolism: Secondary | ICD-10-CM

## 2022-10-28 DIAGNOSIS — Z79890 Hormone replacement therapy (postmenopausal): Secondary | ICD-10-CM

## 2022-10-28 DIAGNOSIS — I214 Non-ST elevation (NSTEMI) myocardial infarction: Secondary | ICD-10-CM | POA: Diagnosis not present

## 2022-10-28 DIAGNOSIS — I5033 Acute on chronic diastolic (congestive) heart failure: Secondary | ICD-10-CM | POA: Diagnosis present

## 2022-10-28 DIAGNOSIS — N185 Chronic kidney disease, stage 5: Secondary | ICD-10-CM | POA: Diagnosis not present

## 2022-10-28 DIAGNOSIS — R0602 Shortness of breath: Secondary | ICD-10-CM | POA: Diagnosis not present

## 2022-10-28 DIAGNOSIS — R531 Weakness: Secondary | ICD-10-CM | POA: Diagnosis not present

## 2022-10-28 DIAGNOSIS — I5032 Chronic diastolic (congestive) heart failure: Secondary | ICD-10-CM | POA: Diagnosis present

## 2022-10-28 DIAGNOSIS — Z8589 Personal history of malignant neoplasm of other organs and systems: Secondary | ICD-10-CM

## 2022-10-28 DIAGNOSIS — R110 Nausea: Secondary | ICD-10-CM | POA: Diagnosis not present

## 2022-10-28 DIAGNOSIS — I480 Paroxysmal atrial fibrillation: Secondary | ICD-10-CM | POA: Diagnosis not present

## 2022-10-28 DIAGNOSIS — E1151 Type 2 diabetes mellitus with diabetic peripheral angiopathy without gangrene: Secondary | ICD-10-CM | POA: Diagnosis not present

## 2022-10-28 DIAGNOSIS — I10 Essential (primary) hypertension: Secondary | ICD-10-CM | POA: Diagnosis not present

## 2022-10-28 DIAGNOSIS — T68XXXA Hypothermia, initial encounter: Secondary | ICD-10-CM | POA: Diagnosis not present

## 2022-10-28 DIAGNOSIS — Z8521 Personal history of malignant neoplasm of larynx: Secondary | ICD-10-CM | POA: Diagnosis not present

## 2022-10-28 DIAGNOSIS — E1122 Type 2 diabetes mellitus with diabetic chronic kidney disease: Secondary | ICD-10-CM | POA: Diagnosis not present

## 2022-10-28 DIAGNOSIS — Z888 Allergy status to other drugs, medicaments and biological substances status: Secondary | ICD-10-CM

## 2022-10-28 DIAGNOSIS — R0789 Other chest pain: Secondary | ICD-10-CM | POA: Diagnosis not present

## 2022-10-28 DIAGNOSIS — J90 Pleural effusion, not elsewhere classified: Secondary | ICD-10-CM | POA: Diagnosis not present

## 2022-10-28 DIAGNOSIS — R7989 Other specified abnormal findings of blood chemistry: Secondary | ICD-10-CM | POA: Diagnosis not present

## 2022-10-28 DIAGNOSIS — K449 Diaphragmatic hernia without obstruction or gangrene: Secondary | ICD-10-CM | POA: Diagnosis present

## 2022-10-28 DIAGNOSIS — Z7901 Long term (current) use of anticoagulants: Secondary | ICD-10-CM

## 2022-10-28 DIAGNOSIS — I959 Hypotension, unspecified: Secondary | ICD-10-CM | POA: Diagnosis not present

## 2022-10-28 DIAGNOSIS — E039 Hypothyroidism, unspecified: Secondary | ICD-10-CM | POA: Diagnosis present

## 2022-10-28 DIAGNOSIS — R54 Age-related physical debility: Secondary | ICD-10-CM | POA: Diagnosis present

## 2022-10-28 DIAGNOSIS — Z711 Person with feared health complaint in whom no diagnosis is made: Secondary | ICD-10-CM | POA: Diagnosis not present

## 2022-10-28 DIAGNOSIS — G894 Chronic pain syndrome: Secondary | ICD-10-CM | POA: Diagnosis present

## 2022-10-28 DIAGNOSIS — E440 Moderate protein-calorie malnutrition: Secondary | ICD-10-CM | POA: Diagnosis present

## 2022-10-28 DIAGNOSIS — E785 Hyperlipidemia, unspecified: Secondary | ICD-10-CM | POA: Diagnosis present

## 2022-10-28 DIAGNOSIS — J449 Chronic obstructive pulmonary disease, unspecified: Secondary | ICD-10-CM | POA: Diagnosis present

## 2022-10-28 DIAGNOSIS — Z9002 Acquired absence of larynx: Secondary | ICD-10-CM

## 2022-10-28 DIAGNOSIS — I132 Hypertensive heart and chronic kidney disease with heart failure and with stage 5 chronic kidney disease, or end stage renal disease: Secondary | ICD-10-CM | POA: Diagnosis not present

## 2022-10-28 DIAGNOSIS — Z66 Do not resuscitate: Secondary | ICD-10-CM | POA: Diagnosis not present

## 2022-10-28 DIAGNOSIS — Z881 Allergy status to other antibiotic agents status: Secondary | ICD-10-CM

## 2022-10-28 DIAGNOSIS — Z930 Tracheostomy status: Secondary | ICD-10-CM

## 2022-10-28 DIAGNOSIS — Z789 Other specified health status: Secondary | ICD-10-CM | POA: Diagnosis not present

## 2022-10-28 DIAGNOSIS — Z7189 Other specified counseling: Secondary | ICD-10-CM | POA: Diagnosis not present

## 2022-10-28 DIAGNOSIS — R079 Chest pain, unspecified: Secondary | ICD-10-CM | POA: Diagnosis not present

## 2022-10-28 DIAGNOSIS — I4891 Unspecified atrial fibrillation: Secondary | ICD-10-CM | POA: Diagnosis not present

## 2022-10-28 DIAGNOSIS — R630 Anorexia: Secondary | ICD-10-CM | POA: Diagnosis not present

## 2022-10-28 DIAGNOSIS — Z79899 Other long term (current) drug therapy: Secondary | ICD-10-CM

## 2022-10-28 DIAGNOSIS — Z8542 Personal history of malignant neoplasm of other parts of uterus: Secondary | ICD-10-CM

## 2022-10-28 DIAGNOSIS — R29818 Other symptoms and signs involving the nervous system: Secondary | ICD-10-CM | POA: Diagnosis not present

## 2022-10-28 DIAGNOSIS — Z515 Encounter for palliative care: Secondary | ICD-10-CM | POA: Diagnosis not present

## 2022-10-28 DIAGNOSIS — R0902 Hypoxemia: Secondary | ICD-10-CM | POA: Diagnosis not present

## 2022-10-28 DIAGNOSIS — Z923 Personal history of irradiation: Secondary | ICD-10-CM

## 2022-10-28 DIAGNOSIS — Z8673 Personal history of transient ischemic attack (TIA), and cerebral infarction without residual deficits: Secondary | ICD-10-CM

## 2022-10-28 DIAGNOSIS — I160 Hypertensive urgency: Secondary | ICD-10-CM | POA: Diagnosis present

## 2022-10-28 DIAGNOSIS — Z6820 Body mass index (BMI) 20.0-20.9, adult: Secondary | ICD-10-CM

## 2022-10-28 LAB — HEPATIC FUNCTION PANEL
ALT: 12 U/L (ref 0–44)
AST: 13 U/L — ABNORMAL LOW (ref 15–41)
Albumin: 3.3 g/dL — ABNORMAL LOW (ref 3.5–5.0)
Alkaline Phosphatase: 78 U/L (ref 38–126)
Bilirubin, Direct: 0.1 mg/dL (ref 0.0–0.2)
Indirect Bilirubin: 0.5 mg/dL (ref 0.3–0.9)
Total Bilirubin: 0.6 mg/dL (ref 0.3–1.2)
Total Protein: 6.8 g/dL (ref 6.5–8.1)

## 2022-10-28 LAB — CBC WITH DIFFERENTIAL/PLATELET
Abs Immature Granulocytes: 0.04 10*3/uL (ref 0.00–0.07)
Basophils Absolute: 0 10*3/uL (ref 0.0–0.1)
Basophils Relative: 0 %
Eosinophils Absolute: 0.1 10*3/uL (ref 0.0–0.5)
Eosinophils Relative: 2 %
HCT: 26 % — ABNORMAL LOW (ref 36.0–46.0)
Hemoglobin: 8.4 g/dL — ABNORMAL LOW (ref 12.0–15.0)
Immature Granulocytes: 1 %
Lymphocytes Relative: 16 %
Lymphs Abs: 1.3 10*3/uL (ref 0.7–4.0)
MCH: 27.8 pg (ref 26.0–34.0)
MCHC: 32.3 g/dL (ref 30.0–36.0)
MCV: 86.1 fL (ref 80.0–100.0)
Monocytes Absolute: 0.6 10*3/uL (ref 0.1–1.0)
Monocytes Relative: 8 %
Neutro Abs: 5.8 10*3/uL (ref 1.7–7.7)
Neutrophils Relative %: 73 %
Platelets: 186 10*3/uL (ref 150–400)
RBC: 3.02 MIL/uL — ABNORMAL LOW (ref 3.87–5.11)
RDW: 14.7 % (ref 11.5–15.5)
WBC: 7.9 10*3/uL (ref 4.0–10.5)
nRBC: 0 % (ref 0.0–0.2)

## 2022-10-28 LAB — PROTIME-INR
INR: 1.5 — ABNORMAL HIGH (ref 0.8–1.2)
Prothrombin Time: 18.1 seconds — ABNORMAL HIGH (ref 11.4–15.2)

## 2022-10-28 LAB — APTT: aPTT: 35 seconds (ref 24–36)

## 2022-10-28 LAB — BASIC METABOLIC PANEL
Anion gap: 14 (ref 5–15)
BUN: 58 mg/dL — ABNORMAL HIGH (ref 8–23)
CO2: 20 mmol/L — ABNORMAL LOW (ref 22–32)
Calcium: 9.3 mg/dL (ref 8.9–10.3)
Chloride: 106 mmol/L (ref 98–111)
Creatinine, Ser: 3.99 mg/dL — ABNORMAL HIGH (ref 0.44–1.00)
GFR, Estimated: 11 mL/min — ABNORMAL LOW (ref >60)
Glucose, Bld: 110 mg/dL — ABNORMAL HIGH (ref 70–99)
Potassium: 4.3 mmol/L (ref 3.5–5.1)
Sodium: 140 mmol/L (ref 135–145)

## 2022-10-28 LAB — BRAIN NATRIURETIC PEPTIDE: B Natriuretic Peptide: 559.7 pg/mL — ABNORMAL HIGH (ref 0.0–100.0)

## 2022-10-28 LAB — ETHANOL: Alcohol, Ethyl (B): <10 mg/dL (ref <10)

## 2022-10-28 LAB — TROPONIN I (HIGH SENSITIVITY): Troponin I (High Sensitivity): 1763 ng/L (ref <18)

## 2022-10-28 NOTE — ED Triage Notes (Signed)
Pt bib [facility] from home. Walked into kitchen around 8pm with family clutching chest saying it was hard to breath and she was having pain. Per family pt sweaty and weak in the legs. Pt c/o 9/10 pain with ems no sweatiness with ems but weakness in BLE. EMS gave 3 nitro tablets with continuous 8-9/10 chest pain and 324 mg of asa. Pt currenty has a trach in place. Ems vitals 262/90  98% 02

## 2022-10-28 NOTE — ED Notes (Signed)
Patient transported to CT 

## 2022-10-28 NOTE — ED Notes (Signed)
EDP notified of elevated trop 

## 2022-10-28 NOTE — ED Provider Notes (Signed)
[facility] DEPARTMENT Provider Note   CSN: [id number] Arrival date & time: 10/28/22  2140     History {Add pertinent medical, surgical, social history, OB history to HPI:1} Chief Complaint  Patient presents with   Chest Pain   Weakness   Son [name] at the bedside acting as Optometrist for this patient, per the family preference. Video interpreter offered, family declined.  [name] is a 76 y.o. female who presents via EMS for multiple concerns.  Patient accompanied by her son [name] at the bedside.  Patient has remote history of laryngeal cancer status post laryngectomy, does have a stoma.  History provided by her son who is also acting as interpreter as above. Patient's son states that today she has been sleeping all day is very low mood.  This evening was complaining of some central chest pain and also had an episode where she began to be unable to swallow food or drink, when reaching for a glass of water cannot hold the cup due to weakness and poor coordination in her left hand.  EMS was called at that time and now there is drug screen on site was reassuring, she was transported to the ED.  Patient also concerned that she had swelling around her eyes for the last week or so. LKN from neurologic standpoint was 2330 on 10/26/22.  In addition to the above listed history of the patient has history of atrial fibrillation, CVA, COPD, CKD stage V not on dialysis (family choice), peripheral artery disease, hyperlipidemia, and history of uterine cancer treated in [place] in 1979.  Per family retains years.  Patient is anticoagulated with Eliquis and is also on dual antiplatelet therapy with aspirin and cilostazol.  HPI     Home Medications Prior to Admission medications   Medication Sig Start Date End Date Taking? Authorizing Provider  acetaminophen (TYLENOL) 500 MG tablet Take 1,000 mg by mouth as needed for headache.    [provider]  apixaban  (ELIQUIS) 2.5 MG TABS tablet Take 1 tablet (2.5 mg total) by mouth 2 (two) times daily. 02/10/22   Hoyt Koch, MD  carvedilol (COREG) 25 MG tablet TAKE 1 TABLET (25 MG TOTAL) BY MOUTH TWICE A DAY WITH MEALS Patient taking differently: Take 25 mg by mouth 2 (two) times daily with a meal. 05/26/22   Hoyt Koch, MD  cilostazol (PLETAL) 100 MG tablet TAKE 1 TABLET BY MOUTH TWICE A DAY Patient taking differently: Take 100 mg by mouth 2 (two) times daily. 02/03/22   Hoyt Koch, MD  cloNIDine (CATAPRES - DOSED IN MG/24 HR) 0.1 mg/24hr patch Place 1 patch (0.1 mg total) onto the skin once a week. 09/18/22   Barb Merino, MD  cloNIDine (CATAPRES) 0.1 MG tablet Take 1 tablet (0.1 mg total) by mouth 4 (four) times daily as needed (for SBP > 180 or DBP > 100). 09/17/22   Barb Merino, MD  diphenoxylate-atropine (LOMOTIL) 2.5-0.025 MG tablet Take 1 tablet by mouth daily as needed for diarrhea or loose stools. 10/23/22   Hoyt Koch, MD  DULoxetine (CYMBALTA) 20 MG capsule TAKE 1 CAPSULE BY MOUTH EVERY DAY Patient taking differently: Take 20 mg by mouth daily. 07/23/22   Hoyt Koch, MD  Ensure (ENSURE) Take 237 mLs by mouth daily.    [provider]  fluticasone (FLONASE) 50 MCG/ACT nasal spray SPRAY 2 SPRAYS INTO EACH NOSTRIL EVERY DAY 01/31/22   Janith Lima, MD  hydrALAZINE (APRESOLINE) 100  MG tablet Take 1 tablet (100 mg total) by mouth 3 (three) times daily. 03/03/22   Hoyt Koch, MD  isosorbide mononitrate (IMDUR) 60 MG 24 hr tablet Take 1 tablet (60 mg total) by mouth 2 (two) times daily. 09/17/22 10/17/22  Barb Merino, MD  levothyroxine (SYNTHROID) 50 MCG tablet Take 1 tablet (50 mcg total) by mouth daily before breakfast. Additional refill from pcp 10/20/22   Nche, Charlene Brooke, NP  loperamide (IMODIUM A-D) 2 MG tablet Take 2 mg by mouth as needed for diarrhea or loose stools.    [provider]  LORazepam (ATIVAN) 1 MG  tablet Take 1 tablet (1 mg total) by mouth 2 (two) times daily as needed for anxiety. 08/13/22   Hoyt Koch, MD  minoxidil (LONITEN) 2.5 MG tablet Take 1 tablet (2.5 mg total) by mouth 2 (two) times daily. 10/23/22   Hoyt Koch, MD  mirtazapine (REMERON) 15 MG tablet TAKE 1 TABLET BY MOUTH EVERYDAY AT BEDTIME 09/18/22   Hoyt Koch, MD  moxifloxacin (VIGAMOX) 0.5 % ophthalmic solution Place 1 drop into the left eye 3 (three) times daily. 10/20/22   Nche, Charlene Brooke, NP  ondansetron (ZOFRAN) 4 MG tablet TAKE 1 TABLET BY MOUTH EVERY 8 HOURS AS NEEDED FOR NAUSEA AND VOMITING Patient taking differently: Take 4 mg by mouth as needed for nausea or vomiting. 02/03/22   Hoyt Koch, MD  pantoprazole (PROTONIX) 40 MG tablet Take 1 tablet (40 mg total) by mouth daily. 10/23/22   Hoyt Koch, MD  rosuvastatin (CRESTOR) 20 MG tablet TAKE 1 TABLET (20 MG TOTAL) BY MOUTH DAILY. Patient taking differently: Take 20 mg by mouth at bedtime. 07/29/22   Hoyt Koch, MD  sodium bicarbonate 650 MG tablet Take 650 mg by mouth 3 (three) times daily. 10/01/22   [provider]  levocetirizine (XYZAL) 5 MG tablet TAKE 1 TABLET(5 MG) BY MOUTH EVERY EVENING 02/21/20 06/10/20  Janith Lima, MD      Allergies    Xyzal [levocetirizine dihydrochloride], Augmentin [amoxicillin-pot clavulanate], and Tribenzor [olmesartan-amlodipine-hctz]    Review of Systems   Review of Systems  Constitutional:  Positive for fatigue.  Cardiovascular:  Positive for chest pain.  Gastrointestinal:  Positive for diarrhea.  Neurological:  Positive for weakness.       Difficulty swallowing  Psychiatric/Behavioral:  Positive for dysphoric mood.     Physical Exam Updated Vital Signs BP (!) 175/84   Pulse 71   Temp 98.9 F (37.2 C) (Oral)   Resp (!) 21   SpO2 98%  Physical Exam Vitals and nursing note reviewed.  Constitutional:      Appearance: She is not ill-appearing  or toxic-appearing.  HENT:     Head: Normocephalic and atraumatic.      Nose: Nose normal.     Mouth/Throat:     Mouth: Mucous membranes are moist.     Pharynx: Oropharynx is clear. Uvula midline. No oropharyngeal exudate or posterior oropharyngeal erythema.  Eyes:     General: No visual field deficit.       Right eye: No discharge.        Left eye: No discharge.     Conjunctiva/sclera: Conjunctivae normal.  Neck:   Cardiovascular:     Rate and Rhythm: Normal rate and regular rhythm.     Pulses: Normal pulses.     Heart sounds: Normal heart sounds.  Pulmonary:     Effort: Pulmonary effort is normal. No tachypnea, bradypnea, accessory  muscle usage, prolonged expiration or respiratory distress.     Breath sounds: Normal breath sounds. No wheezing or rales.  Chest:     Chest wall: No mass, lacerations, deformity, swelling, tenderness, crepitus or edema.  Abdominal:     General: Bowel sounds are normal. There is no distension.     Palpations: Abdomen is soft.     Tenderness: There is no abdominal tenderness.  Musculoskeletal:        General: No deformity.     Cervical back: Normal range of motion and neck supple.     Right lower leg: No tenderness. No edema.     Left lower leg: No tenderness. No edema.  Skin:    General: Skin is warm and dry.     Capillary Refill: Capillary refill takes less than 2 seconds.  Neurological:     General: No focal deficit present.     Mental Status: She is alert and oriented to person, place, and time. Mental status is at baseline.     GCS: GCS eye subscore is 4. GCS verbal subscore is 5. GCS motor subscore is 6.     Cranial Nerves: No facial asymmetry.     Sensory: Sensory deficit present.     Motor: No weakness, abnormal muscle tone or pronator drift.     Coordination: Heel to Shin Test normal.     Comments: Patient states that sensation is different between the 2 sides of her face though she is unable to describe this any further. Finger-nose  testing with the left arm is normal, patient refused to participate in finger-nose testing with the right arm due to discomfort in the elbow with flexion of the arm secondary to IV. Romberg and gait deferred at this time.  Psychiatric:        Mood and Affect: Mood normal.     ED Results / Procedures / Treatments   Labs (all labs ordered are listed, but only abnormal results are displayed) Labs Reviewed  CBC WITH DIFFERENTIAL/PLATELET  BASIC METABOLIC PANEL  BRAIN NATRIURETIC PEPTIDE  TROPONIN I (HIGH SENSITIVITY)    EKG EKG Interpretation  Date/Time:  Wednesday October 28 2022 21:42:53 EST Ventricular Rate:  70 PR Interval:  148 QRS Duration: 74 QT Interval:  437 QTC Calculation: 472 R Axis:   68 Text Interpretation: Sinus rhythm Probable left atrial enlargement Anteroseptal infarct, age indeterminate Confirmed by Fredia Sorrow [phone]) on 10/28/2022 10:23:02 PM  Radiology DG Chest Portable 1 View  Result Date: 10/28/2022 CLINICAL DATA:  Shortness of breath. EXAM: PORTABLE CHEST 1 VIEW COMPARISON:  Chest radiograph dated September 08, 2022. FINDINGS: The heart is mildly enlarged. Prominent atherosclerotic calcification of the aortic arch. Biapical pleural/parenchymal scarring. No focal consolidation or pleural effusion. IMPRESSION: Mild cardiomegaly. No focal consolidation or pleural effusion. Electronically Signed   By: Keane Police D.O.   On: 10/28/2022 22:30    Procedures Procedures  {Document cardiac monitor, telemetry assessment procedure when appropriate:1}  Medications Ordered in ED Medications - No data to display  ED Course/ Medical Decision Making/ A&P                           Medical Decision Making Amount and/or Complexity of Data Reviewed Labs: ordered. Radiology: ordered.   ***  {Document critical care time when appropriate:1} {Document review of labs and clinical decision tools ie heart score, Chads2Vasc2 etc:1}  {Document your independent  review of radiology images, and any outside records:1} {Document  your discussion with family members, caretakers, and with consultants:1} {Document social determinants of health affecting pt's care:1} {Document your decision making why or why not admission, treatments were needed:1} Final Clinical Impression(s) / ED Diagnoses Final diagnoses:  None    Rx / DC Orders ED Discharge Orders     None

## 2022-10-29 ENCOUNTER — Emergency Department (HOSPITAL_COMMUNITY): Payer: Medicare Other

## 2022-10-29 ENCOUNTER — Inpatient Hospital Stay (HOSPITAL_COMMUNITY): Payer: Medicare Other

## 2022-10-29 DIAGNOSIS — Z7189 Other specified counseling: Secondary | ICD-10-CM

## 2022-10-29 DIAGNOSIS — Z515 Encounter for palliative care: Secondary | ICD-10-CM

## 2022-10-29 DIAGNOSIS — G4700 Insomnia, unspecified: Secondary | ICD-10-CM | POA: Diagnosis present

## 2022-10-29 DIAGNOSIS — I214 Non-ST elevation (NSTEMI) myocardial infarction: Secondary | ICD-10-CM | POA: Diagnosis present

## 2022-10-29 DIAGNOSIS — Z789 Other specified health status: Secondary | ICD-10-CM

## 2022-10-29 DIAGNOSIS — J90 Pleural effusion, not elsewhere classified: Secondary | ICD-10-CM | POA: Diagnosis not present

## 2022-10-29 DIAGNOSIS — R7989 Other specified abnormal findings of blood chemistry: Secondary | ICD-10-CM | POA: Diagnosis not present

## 2022-10-29 DIAGNOSIS — R1314 Dysphagia, pharyngoesophageal phase: Secondary | ICD-10-CM | POA: Diagnosis present

## 2022-10-29 DIAGNOSIS — I480 Paroxysmal atrial fibrillation: Secondary | ICD-10-CM

## 2022-10-29 DIAGNOSIS — J449 Chronic obstructive pulmonary disease, unspecified: Secondary | ICD-10-CM | POA: Diagnosis present

## 2022-10-29 DIAGNOSIS — R630 Anorexia: Secondary | ICD-10-CM | POA: Diagnosis not present

## 2022-10-29 DIAGNOSIS — E7800 Pure hypercholesterolemia, unspecified: Secondary | ICD-10-CM | POA: Diagnosis present

## 2022-10-29 DIAGNOSIS — I5032 Chronic diastolic (congestive) heart failure: Secondary | ICD-10-CM | POA: Diagnosis present

## 2022-10-29 DIAGNOSIS — Z8521 Personal history of malignant neoplasm of larynx: Secondary | ICD-10-CM | POA: Diagnosis not present

## 2022-10-29 DIAGNOSIS — Z711 Person with feared health complaint in whom no diagnosis is made: Secondary | ICD-10-CM | POA: Diagnosis not present

## 2022-10-29 DIAGNOSIS — I4891 Unspecified atrial fibrillation: Secondary | ICD-10-CM

## 2022-10-29 DIAGNOSIS — Z9002 Acquired absence of larynx: Secondary | ICD-10-CM

## 2022-10-29 DIAGNOSIS — E1151 Type 2 diabetes mellitus with diabetic peripheral angiopathy without gangrene: Secondary | ICD-10-CM | POA: Diagnosis present

## 2022-10-29 DIAGNOSIS — D631 Anemia in chronic kidney disease: Secondary | ICD-10-CM | POA: Diagnosis present

## 2022-10-29 DIAGNOSIS — R29818 Other symptoms and signs involving the nervous system: Secondary | ICD-10-CM | POA: Diagnosis not present

## 2022-10-29 DIAGNOSIS — E1122 Type 2 diabetes mellitus with diabetic chronic kidney disease: Secondary | ICD-10-CM | POA: Diagnosis present

## 2022-10-29 DIAGNOSIS — E785 Hyperlipidemia, unspecified: Secondary | ICD-10-CM

## 2022-10-29 DIAGNOSIS — M549 Dorsalgia, unspecified: Secondary | ICD-10-CM | POA: Diagnosis present

## 2022-10-29 DIAGNOSIS — E039 Hypothyroidism, unspecified: Secondary | ICD-10-CM | POA: Diagnosis present

## 2022-10-29 DIAGNOSIS — K5900 Constipation, unspecified: Secondary | ICD-10-CM | POA: Diagnosis present

## 2022-10-29 DIAGNOSIS — R110 Nausea: Secondary | ICD-10-CM | POA: Diagnosis not present

## 2022-10-29 DIAGNOSIS — Z66 Do not resuscitate: Secondary | ICD-10-CM | POA: Diagnosis not present

## 2022-10-29 DIAGNOSIS — F411 Generalized anxiety disorder: Secondary | ICD-10-CM | POA: Diagnosis present

## 2022-10-29 DIAGNOSIS — I10 Essential (primary) hypertension: Secondary | ICD-10-CM

## 2022-10-29 DIAGNOSIS — F32A Depression, unspecified: Secondary | ICD-10-CM | POA: Diagnosis present

## 2022-10-29 DIAGNOSIS — I700 Atherosclerosis of aorta: Secondary | ICD-10-CM | POA: Diagnosis present

## 2022-10-29 DIAGNOSIS — N185 Chronic kidney disease, stage 5: Secondary | ICD-10-CM | POA: Diagnosis present

## 2022-10-29 DIAGNOSIS — I080 Rheumatic disorders of both mitral and aortic valves: Secondary | ICD-10-CM | POA: Diagnosis present

## 2022-10-29 DIAGNOSIS — E440 Moderate protein-calorie malnutrition: Secondary | ICD-10-CM | POA: Diagnosis present

## 2022-10-29 DIAGNOSIS — G894 Chronic pain syndrome: Secondary | ICD-10-CM | POA: Diagnosis present

## 2022-10-29 DIAGNOSIS — Z930 Tracheostomy status: Secondary | ICD-10-CM | POA: Diagnosis not present

## 2022-10-29 DIAGNOSIS — Z1152 Encounter for screening for COVID-19: Secondary | ICD-10-CM | POA: Diagnosis not present

## 2022-10-29 DIAGNOSIS — J323 Chronic sphenoidal sinusitis: Secondary | ICD-10-CM | POA: Diagnosis not present

## 2022-10-29 DIAGNOSIS — I132 Hypertensive heart and chronic kidney disease with heart failure and with stage 5 chronic kidney disease, or end stage renal disease: Secondary | ICD-10-CM | POA: Diagnosis present

## 2022-10-29 LAB — BASIC METABOLIC PANEL
Anion gap: 11 (ref 5–15)
Anion gap: 12 (ref 5–15)
BUN: 57 mg/dL — ABNORMAL HIGH (ref 8–23)
BUN: 55 mg/dL — ABNORMAL HIGH (ref 8–23)
CO2: 20 mmol/L — ABNORMAL LOW (ref 22–32)
CO2: 20 mmol/L — ABNORMAL LOW (ref 22–32)
Calcium: 8.8 mg/dL — ABNORMAL LOW (ref 8.9–10.3)
Calcium: 8.9 mg/dL (ref 8.9–10.3)
Chloride: 109 mmol/L (ref 98–111)
Chloride: 108 mmol/L (ref 98–111)
Creatinine, Ser: 4.13 mg/dL — ABNORMAL HIGH (ref 0.44–1.00)
Creatinine, Ser: 4.13 mg/dL — ABNORMAL HIGH (ref 0.44–1.00)
GFR, Estimated: 11 mL/min — ABNORMAL LOW (ref >60)
GFR, Estimated: 11 mL/min — ABNORMAL LOW (ref >60)
Glucose, Bld: 105 mg/dL — ABNORMAL HIGH (ref 70–99)
Glucose, Bld: 109 mg/dL — ABNORMAL HIGH (ref 70–99)
Potassium: 4.4 mmol/L (ref 3.5–5.1)
Potassium: 4.2 mmol/L (ref 3.5–5.1)
Sodium: 140 mmol/L (ref 135–145)
Sodium: 140 mmol/L (ref 135–145)

## 2022-10-29 LAB — URINALYSIS, ROUTINE W REFLEX MICROSCOPIC
Bacteria, UA: NONE SEEN
Bilirubin Urine: NEGATIVE
Glucose, UA: NEGATIVE mg/dL
Hgb urine dipstick: NEGATIVE
Ketones, ur: NEGATIVE mg/dL
Leukocytes,Ua: NEGATIVE
Nitrite: NEGATIVE
Protein, ur: >=300 mg/dL — AB
Specific Gravity, Urine: 1.01 (ref 1.005–1.030)
pH: 6 (ref 5.0–8.0)

## 2022-10-29 LAB — ECHOCARDIOGRAM COMPLETE
AR max vel: 2.32 cm2
AV Area VTI: 2.33 cm2
AV Area mean vel: 2.29 cm2
AV Mean grad: 4 mmHg
AV Peak grad: 6.5 mmHg
Ao pk vel: 1.27 m/s
Area-P 1/2: 2.84 cm2
Height: 61 in
S' Lateral: 2.9 cm
Weight: 1760 oz

## 2022-10-29 LAB — RESP PANEL BY RT-PCR (FLU A&B, COVID) ARPGX2
Influenza A by PCR: NEGATIVE
Influenza B by PCR: NEGATIVE
SARS Coronavirus 2 by RT PCR: NEGATIVE

## 2022-10-29 LAB — CBC
HCT: 24.5 % — ABNORMAL LOW (ref 36.0–46.0)
Hemoglobin: 7.7 g/dL — ABNORMAL LOW (ref 12.0–15.0)
MCH: 27.6 pg (ref 26.0–34.0)
MCHC: 31.4 g/dL (ref 30.0–36.0)
MCV: 87.8 fL (ref 80.0–100.0)
Platelets: 169 10*3/uL (ref 150–400)
RBC: 2.79 MIL/uL — ABNORMAL LOW (ref 3.87–5.11)
RDW: 14.6 % (ref 11.5–15.5)
WBC: 7.4 10*3/uL (ref 4.0–10.5)
nRBC: 0 % (ref 0.0–0.2)

## 2022-10-29 LAB — TROPONIN I (HIGH SENSITIVITY)
Troponin I (High Sensitivity): 2314 ng/L (ref <18)
Troponin I (High Sensitivity): 3449 ng/L (ref <18)
Troponin I (High Sensitivity): 3172 ng/L (ref <18)

## 2022-10-29 LAB — RAPID URINE DRUG SCREEN, HOSP PERFORMED
Amphetamines: NOT DETECTED
Barbiturates: NOT DETECTED
Benzodiazepines: NOT DETECTED
Cocaine: NOT DETECTED
Opiates: NOT DETECTED
Tetrahydrocannabinol: NOT DETECTED

## 2022-10-29 LAB — MAGNESIUM: Magnesium: 1.9 mg/dL (ref 1.7–2.4)

## 2022-10-29 LAB — HEPARIN LEVEL (UNFRACTIONATED): Heparin Unfractionated: >1.1 IU/mL — ABNORMAL HIGH (ref 0.30–0.70)

## 2022-10-29 MED ORDER — ROSUVASTATIN CALCIUM 20 MG PO TABS
20.0000 mg | ORAL_TABLET | Freq: Every day | ORAL | Status: DC
  Administered 2022-10-29 – 2022-10-31 (×3): 20 mg via ORAL
  Filled 2022-10-29 (×3): qty 1

## 2022-10-29 MED ORDER — ACETAMINOPHEN 325 MG PO TABS
650.0000 mg | ORAL_TABLET | ORAL | Status: DC | PRN
  Administered 2022-10-29: 650 mg via ORAL
  Filled 2022-10-29: qty 2

## 2022-10-29 MED ORDER — ISOSORBIDE MONONITRATE ER 60 MG PO TB24
60.0000 mg | ORAL_TABLET | Freq: Two times a day (BID) | ORAL | Status: DC
  Administered 2022-10-29 – 2022-10-31 (×5): 60 mg via ORAL
  Filled 2022-10-29: qty 2
  Filled 2022-10-29 (×4): qty 1

## 2022-10-29 MED ORDER — FENTANYL CITRATE PF 50 MCG/ML IJ SOSY
25.0000 ug | PREFILLED_SYRINGE | Freq: Once | INTRAMUSCULAR | Status: AC
  Administered 2022-10-29: 25 ug via INTRAVENOUS
  Filled 2022-10-29: qty 1

## 2022-10-29 MED ORDER — ORAL CARE MOUTH RINSE: 15.0000 mL | OROMUCOSAL | Status: DC | PRN

## 2022-10-29 MED ORDER — FERROUS SULFATE 325 (65 FE) MG PO TABS
325.0000 mg | ORAL_TABLET | Freq: Every day | ORAL | Status: DC
  Administered 2022-10-30 – 2022-10-31 (×2): 325 mg via ORAL
  Filled 2022-10-29 (×2): qty 1

## 2022-10-29 MED ORDER — CILOSTAZOL 100 MG PO TABS
100.0000 mg | ORAL_TABLET | Freq: Two times a day (BID) | ORAL | Status: DC
  Administered 2022-10-29 – 2022-10-31 (×5): 100 mg via ORAL
  Filled 2022-10-29 (×6): qty 1

## 2022-10-29 MED ORDER — ONDANSETRON HCL 4 MG/2ML IJ SOLN
4.0000 mg | Freq: Four times a day (QID) | INTRAMUSCULAR | Status: DC | PRN
  Administered 2022-10-29 – 2022-10-30 (×2): 4 mg via INTRAVENOUS
  Filled 2022-10-29 (×2): qty 2

## 2022-10-29 MED ORDER — LORAZEPAM 1 MG PO TABS: 1.0000 mg | ORAL_TABLET | Freq: Two times a day (BID) | ORAL | Status: DC | PRN

## 2022-10-29 MED ORDER — NITROGLYCERIN 0.4 MG SL SUBL
0.4000 mg | SUBLINGUAL_TABLET | SUBLINGUAL | Status: DC | PRN
  Administered 2022-10-29 (×2): 0.4 mg via SUBLINGUAL
  Filled 2022-10-29: qty 1

## 2022-10-29 MED ORDER — ASPIRIN 325 MG PO TBEC: 325.0000 mg | DELAYED_RELEASE_TABLET | Freq: Every day | ORAL | Status: DC

## 2022-10-29 MED ORDER — LEVOTHYROXINE SODIUM 50 MCG PO TABS
50.0000 ug | ORAL_TABLET | Freq: Every day | ORAL | Status: DC
  Administered 2022-10-29 – 2022-10-31 (×3): 50 ug via ORAL
  Filled 2022-10-29: qty 2
  Filled 2022-10-29 (×2): qty 1

## 2022-10-29 MED ORDER — HYDRALAZINE HCL 50 MG PO TABS
125.0000 mg | ORAL_TABLET | Freq: Three times a day (TID) | ORAL | Status: DC
  Administered 2022-10-29 – 2022-10-30 (×4): 125 mg via ORAL
  Filled 2022-10-29: qty 1
  Filled 2022-10-29: qty 5
  Filled 2022-10-29 (×5): qty 1

## 2022-10-29 MED ORDER — CLONIDINE HCL 0.1 MG/24HR TD PTWK
0.1000 mg | MEDICATED_PATCH | TRANSDERMAL | Status: DC
  Administered 2022-10-30: 0.1 mg via TRANSDERMAL
  Filled 2022-10-29: qty 1

## 2022-10-29 MED ORDER — FUROSEMIDE 10 MG/ML IJ SOLN: 40.0000 mg | Freq: Once | INTRAMUSCULAR | Status: DC

## 2022-10-29 MED ORDER — ATORVASTATIN CALCIUM 40 MG PO TABS: 80.0000 mg | ORAL_TABLET | Freq: Every day | ORAL | Status: DC

## 2022-10-29 MED ORDER — ASPIRIN 81 MG PO TBEC
81.0000 mg | DELAYED_RELEASE_TABLET | Freq: Every day | ORAL | Status: DC
  Administered 2022-10-30 – 2022-10-31 (×2): 81 mg via ORAL
  Filled 2022-10-29 (×2): qty 1

## 2022-10-29 MED ORDER — PANTOPRAZOLE SODIUM 40 MG PO TBEC
40.0000 mg | DELAYED_RELEASE_TABLET | Freq: Every day | ORAL | Status: DC
  Administered 2022-10-29 – 2022-10-31 (×3): 40 mg via ORAL
  Filled 2022-10-29 (×3): qty 1

## 2022-10-29 MED ORDER — LORAZEPAM 2 MG/ML IJ SOLN
1.0000 mg | Freq: Once | INTRAMUSCULAR | Status: AC | PRN
  Administered 2022-10-29: 1 mg via INTRAVENOUS
  Filled 2022-10-29: qty 1

## 2022-10-29 MED ORDER — DULOXETINE HCL 20 MG PO CPEP
20.0000 mg | ORAL_CAPSULE | Freq: Every day | ORAL | Status: DC
  Administered 2022-10-29 – 2022-10-31 (×3): 20 mg via ORAL
  Filled 2022-10-29 (×3): qty 1

## 2022-10-29 MED ORDER — IPRATROPIUM-ALBUTEROL 0.5-2.5 (3) MG/3ML IN SOLN
3.0000 mL | Freq: Four times a day (QID) | RESPIRATORY_TRACT | Status: DC
  Administered 2022-10-29 – 2022-10-30 (×5): 3 mL via RESPIRATORY_TRACT
  Filled 2022-10-29 (×5): qty 3

## 2022-10-29 MED ORDER — GUAIFENESIN ER 600 MG PO TB12
1200.0000 mg | ORAL_TABLET | Freq: Two times a day (BID) | ORAL | Status: DC
  Administered 2022-10-29 – 2022-10-31 (×5): 1200 mg via ORAL
  Filled 2022-10-29 (×5): qty 2

## 2022-10-29 MED ORDER — CARVEDILOL 25 MG PO TABS
25.0000 mg | ORAL_TABLET | Freq: Two times a day (BID) | ORAL | Status: DC
  Administered 2022-10-29 – 2022-10-31 (×5): 25 mg via ORAL
  Filled 2022-10-29 (×3): qty 1
  Filled 2022-10-29: qty 2
  Filled 2022-10-29: qty 1

## 2022-10-29 MED ORDER — APIXABAN 2.5 MG PO TABS
2.5000 mg | ORAL_TABLET | Freq: Two times a day (BID) | ORAL | Status: DC
  Administered 2022-10-29 – 2022-10-31 (×5): 2.5 mg via ORAL
  Filled 2022-10-29 (×5): qty 1

## 2022-10-29 MED ORDER — HYDRALAZINE HCL 50 MG PO TABS
100.0000 mg | ORAL_TABLET | Freq: Three times a day (TID) | ORAL | Status: DC
  Administered 2022-10-29: 100 mg via ORAL
  Filled 2022-10-29: qty 2

## 2022-10-29 MED ORDER — ASPIRIN 81 MG PO CHEW: 324.0000 mg | CHEWABLE_TABLET | Freq: Once | ORAL | Status: DC

## 2022-10-29 MED ORDER — MINOXIDIL 2.5 MG PO TABS
2.5000 mg | ORAL_TABLET | Freq: Two times a day (BID) | ORAL | Status: DC
  Administered 2022-10-29 – 2022-10-31 (×5): 2.5 mg via ORAL
  Filled 2022-10-29 (×6): qty 1

## 2022-10-29 NOTE — Progress Notes (Addendum)
Cardiology Consultation   Patient ID: [name] MRN: [id number]; DOB: [birth year]  Admit date: 10/28/2022 Date of Consult: 10/29/2022  PCP:  Hoyt Koch, MD    [place] Providers Cardiologist:  Mertie Moores, MD        Patient Profile:   [name] is a 76 y.o. female with a hx of HFpEF, atrial fibrillation, hypertension, hyperlipidemia, PAD, COPD, CKD 5, history of head neck cancer status post laryngectomy with stoma, dysphagia, anxiety, depression,chronic pain, GERD, hypothyroidism, anemia, CVA who is being seen 10/29/2022 for the evaluation of chest pain and elevated troponin.    History of Present Illness:   [name] is accompanied by daughter-in-law who was present in the room and provided the history.  Patient endorse chest pain/pressure which continue to persist since yesterday.  Pain is more of a pressure-like, nonradiating, no associated nausea, vomiting or diaphoresis.  Denies palpitations, dizziness, shortness of breath, PND or orthopnea. Patient reports pain is better than yesterday. Patient also c/o mild frontal headache.   Daughter-in-law further reported patient was somnolent and weak yesterday.  Patient reported increased fatigue and not feeling well therefore she stayed in bed all day. Her Blood pressure was elevated with systolic reading 725. Daughter in law gave clonidine 0.1 mg. When patient tried to grab a cup her coordination was poor and she experienced weakness in the left hand.  Daughter-in-law anticipated patient is having stroke therefore EMS was called. When EMS arrived pt reported chest pain. Patient did not had chest pain until then. Patient were given chewable aspirin and nitro which helped a little with the chest pain. Upon arrival to emergency department EKG reflected normal sinus rhythm however troponin were elevated consistent with NSTEMI.  Patient were evaluated by neurology.  Stroke work-up including CT head  and MRI brain were negative for acute intracranial pathology.  On today's evaluation pt c/o mild chest pain/pressure. Denies radiation of pain. No change in pain with breathing or movement.  No focal neurological deficits appreciated.  Patient is comfortably laying in bed without acute visible distress. Telemetry displays normal sinus rhythm.  Patient is on anticoagulation, apixaban and ASA.   When Inquired about the dialysis. Pt declined to be on dialysis as per daughter in law.   Past Medical History:  Diagnosis Date   Anemia    Anxiety    takes Ativan prn   Blood transfusion without reported diagnosis 09/15/12   2 units Prbc's   Broken ribs    Chronic back pain    CKD (chronic kidney disease), stage IV (HCC)    Constipation    related to pain meds   COPD (chronic obstructive pulmonary disease) ([place]) 08/10/2012   denies   Depression    Gastrostomy in place [facility])    removed   GERD (gastroesophageal reflux disease)    takes Zantac daily   Headache(784.0)    Hiatal hernia 08/10/2012   History of radiation therapy 10/17/12-11/25/12   supraglottic larynx,high risk neck tumor bed 5880 cGy/28 sessions, high risk lymph node tumor bed 5600 cGy/20 sessions, mod risk lymph node tumor bed 5040 cGy/20 sessions   Hypercholesteremia    takes Pravastatin daily   Hypertension    takes Tribenzor and Atenolol daily   Insomnia    takes Amitriptyline daily   Nausea    takes Zofran prn   PAD (peripheral artery disease) ([place])    noninvasive imaging in 2016   Pneumonia    SCC (squamous cell carcinoma) of  supraglottis area 08/08/2012   Shortness of breath dyspnea    Stroke [facility]) 2011   denies. no residual   Uterine cancer (HCC)     Past Surgical History:  Procedure Laterality Date   ABDOMINAL SURGERY     r/t uterine carcinoma   APPENDECTOMY     DIRECT LARYNGOSCOPY N/A 05/22/2014   Procedure: DIRECT LARYNGOSCOPY WITH ESOPHAGEAL DILATION;  Surgeon: Jerrell Belfast, MD;  Location: [place];   Service: ENT;  Laterality: N/A;   ESOPHAGOSCOPY WITH DILITATION N/A 05/29/2015   Procedure: ESOPHAGOSCOPY WITH ESOPHAGEAL DILITATION;  Surgeon: Jerrell Belfast, MD;  Location: [place];  Service: ENT;  Laterality: N/A;   Gastrostomy Tube removed   2013   GASTROSTOMY W/ FEEDING TUBE  12   HERNIA REPAIR     child   LARYNGETOMY  08/31/2012   Procedure: LARYNGECTOMY;  Surgeon: Jerrell Belfast, MD;  Location: [place];  Service: ENT;  Laterality: N/A;   LARYNGOSCOPY  08/10/2012   Procedure: LARYNGOSCOPY;  Surgeon: Jerrell Belfast, MD;  Location: [facility];  Service: ENT;  Laterality: N/A;  with biopsy   RADICAL NECK DISSECTION  08/31/2012   Procedure: RADICAL NECK DISSECTION;  Surgeon: Jerrell Belfast, MD;  Location: [place];  Service: ENT;  Laterality: Bilateral;   TRACHEAL ESOPHOGEAL PUNCTURE WITH REPAIR STOMA N/A 09/08/2013   Procedure: TRACHEAL ESOPHOGEAL PUNCTURE WITH PLACEMENT OF  [place] ;  Surgeon: Jerrell Belfast, MD;  Location: [place];  Service: ENT;  Laterality: N/A;   TRACHEOSTOMY TUBE PLACEMENT  08/10/2012   Procedure: TRACHEOSTOMY;  Surgeon: Jerrell Belfast, MD;  Location: [facility];  Service: ENT;  Laterality: N/A;       Inpatient Medications: Scheduled Meds:  apixaban  2.5 mg Oral BID   [START ON 10/30/2022] aspirin EC  81 mg Oral Daily   carvedilol  25 mg Oral BID WC   cilostazol  100 mg Oral BID   [START ON 10/30/2022] cloNIDine  0.1 mg Transdermal Q Fri   DULoxetine  20 mg Oral Daily   guaiFENesin  1,200 mg Oral BID   hydrALAZINE  100 mg Oral Q8H   ipratropium-albuterol  3 mL Nebulization Q6H   isosorbide mononitrate  60 mg Oral BID   levothyroxine  50 mcg Oral QAC breakfast   minoxidil  2.5 mg Oral BID   pantoprazole  40 mg Oral Daily   rosuvastatin  20 mg Oral Daily   Continuous Infusions:  PRN Meds: acetaminophen, LORazepam, nitroGLYCERIN, ondansetron (ZOFRAN) IV  Allergies:    Allergies  Allergen Reactions   Xyzal [Levocetirizine Dihydrochloride] Itching    Augmentin [Amoxicillin-Pot Clavulanate] Diarrhea and Nausea And Vomiting   Tribenzor [Olmesartan-Amlodipine-Hctz] Other (See Comments)    "Hurt the kidneys"    Social History:   Social History   Socioeconomic History   Marital status: Widowed    Spouse name: Not on file   Number of children: 4   Years of education: Not on file   Highest education level: Not on file  Occupational History    Comment: retired Regulatory affairs officer  Tobacco Use   Smoking status: Former    Packs/day: 0.25    Years: 50.00    Total pack years: 12.50    Types: Cigarettes    Quit date: 05/27/2013    Years since quitting: 9.4   Smokeless tobacco: Never  Vaping Use   Vaping Use: Never used  Substance and Sexual Activity   Alcohol use: Yes   Drug use: No   Sexual activity: Not Currently  Other Topics Concern   Not on file  Social History Narrative   ** Merged History Encounter **       Social Determinants of Health   Financial Resource Strain: Low Risk  (10/23/2022)   Overall Financial Resource Strain (CARDIA)    Difficulty of Paying Living Expenses: Not hard at all  Food Insecurity: No Food Insecurity (10/23/2022)   Hunger Vital Sign    Worried About Running Out of Food in the Last Year: Never true    Ran Out of Food in the Last Year: Never true  Transportation Needs: No Transportation Needs (10/23/2022)   PRAPARE - Hydrologist (Medical): No    Lack of Transportation (Non-Medical): No  Physical Activity: Inactive (10/23/2022)   Exercise Vital Sign    Days of Exercise per Week: 0 days    Minutes of Exercise per Session: 0 min  Stress: No Stress Concern Present (10/23/2022)   [place]    Feeling of Stress : Not at all  Social Connections: Socially Isolated (10/23/2022)   Social Connection and Isolation Panel [NHANES]    Frequency of Communication with Friends and Family: Once a week    Frequency  of Social Gatherings with Friends and Family: Once a week    Attends Religious Services: More than 4 times per year    Active Member of [employer] or Organizations: No    Attends Archivist Meetings: Never    Marital Status: Widowed  Intimate Partner Violence: Unknown (10/23/2022)   Humiliation, Afraid, Rape, and Kick questionnaire    Fear of Current or Ex-Partner: Patient refused    Emotionally Abused: Patient refused    Physically Abused: Patient refused    Sexually Abused: Patient refused    Family History:    Family History  Problem Relation Age of Onset   Throat cancer Father    Brain cancer Brother    CAD Neg Hx      ROS:  Please see the history of present illness.   All other ROS reviewed and negative.     Physical Exam/Data:   Vitals:   10/29/22 0800 10/29/22 0830 10/29/22 0900 10/29/22 0915  BP: (!) 171/74 (!) 176/74 (!) 158/68   Pulse: 74 74 73   Resp: '19 20 20   '$ Temp:      TempSrc:      SpO2: 93% 93% 91% 96%  Weight:      Height:       No intake or output data in the 24 hours ending 10/29/22 1000    10/28/2022   11:18 PM 10/23/2022    9:45 AM 10/23/2022    9:08 AM  Last 3 Weights  Weight (lbs) 110 lb 121 lb 121 lb  Weight (kg) 49.896 kg 54.885 kg 54.885 kg     Body mass index is 20.78 kg/m.  Mild difficulty with communication 2/2 laryngectomy with stoma.  Patient comfortably laying in bed in supine position.  No acute visible distress. General:  Well nourished, well developed, in no acute distress. HEENT: normal Neck: no JVD Vascular: No carotid bruits; Distal pulses 2+ bilaterally Cardiac:  normal S1, S2; RRR; no murmur or gallop appreciated Lungs: Coarse bibasilar crackles.  No wheeze or rhonchi appreciated. Abd: soft, nontender, nondistended.   Ext: no edema Musculoskeletal:  No deformities, BUE and BLE strength normal and equal Skin: warm and dry.  Stoma present to anterior neck. Neuro:  CNs 2-12 intact,  no focal abnormalities  noted Psych:  Normal affect.  Alert and oriented x3 EKG:  The EKG was personally reviewed and demonstrates: Normal sinus rhythm Telemetry:  Telemetry was personally reviewed and demonstrates: Normal sinus rhythm  Relevant CV Studies:  ECHO 08/07/2022  IMPRESSIONS     1. Left ventricular ejection fraction, by estimation, is >75%. The left  ventricle has hyperdynamic function. The left ventricle has no regional  wall motion abnormalities. There is moderate concentric left ventricular  hypertrophy. Left ventricular  diastolic parameters are consistent with Grade I diastolic dysfunction  (impaired relaxation).   2. Right ventricular systolic function is normal. The right ventricular  size is normal. There is normal pulmonary artery systolic pressure.   3. Left atrial size was moderately dilated.   4. The mitral valve is normal in structure. No evidence of mitral valve  regurgitation. No evidence of mitral stenosis.   5. The aortic valve is tricuspid. Aortic valve regurgitation is not  visualized. No aortic stenosis is present.   6. The inferior vena cava is normal in size with <50% respiratory  variability, suggesting right atrial pressure of 8 mmHg.   FINDINGS   Left Ventricle: Left ventricular ejection fraction, by estimation, is  >75%. The left ventricle has hyperdynamic function. The left ventricle has  no regional wall motion abnormalities. The left ventricular internal  cavity size was normal in size. There  is moderate concentric left ventricular hypertrophy. Left ventricular  diastolic parameters are consistent with Grade I diastolic dysfunction  (impaired relaxation). Indeterminate filling pressures.   Right Ventricle: The right ventricular size is normal. No increase in  right ventricular wall thickness. Right ventricular systolic function is  normal. There is normal pulmonary artery systolic pressure. The tricuspid  regurgitant velocity is 2.21 m/s, and   with an  assumed right atrial pressure of 8 mmHg, the estimated right  ventricular systolic pressure is 24.2 mmHg.   Left Atrium: Left atrial size was moderately dilated.   Right Atrium: Right atrial size was normal in size.   Pericardium: There is no evidence of pericardial effusion.   Mitral Valve: The mitral valve is normal in structure. Mild mitral annular  calcification. No evidence of mitral valve regurgitation. No evidence of  mitral valve stenosis.   Tricuspid Valve: The tricuspid valve is normal in structure. Tricuspid  valve regurgitation is trivial. No evidence of tricuspid stenosis.   Aortic Valve: The aortic valve is tricuspid. Aortic valve regurgitation is  not visualized. No aortic stenosis is present. Aortic valve peak gradient  measures 8.8 mmHg.   Pulmonic Valve: The pulmonic valve was normal in structure. Pulmonic valve  regurgitation is not visualized. No evidence of pulmonic stenosis.   Aorta: The aortic root is normal in size and structure.   Venous: The inferior vena cava is normal in size with less than 50%  respiratory variability, suggesting right atrial pressure of 8 mmHg.   IAS/Shunts: No atrial level shunt detected by color flow Doppler.     LEFT VENTRICLE  PLAX 2D  LVIDd:         3.75 cm   Diastology  LVIDs:         2.00 cm   LV e' lateral:   4.60 cm/s  LV PW:         1.35 cm   LV E/e' lateral: 13.4  LV IVS:        1.10 cm  LVOT diam:     2.00 cm  LV SV:  53  LV SV Index:   34  LVOT Area:     3.14 cm     RIGHT VENTRICLE             IVC  RV Basal diam:  2.80 cm     IVC diam: 1.60 cm  RV S prime:     16.80 cm/s  TAPSE (M-mode): 2.5 cm   LEFT ATRIUM             Index        RIGHT ATRIUM           Index  LA diam:        2.80 cm 1.78 cm/m   RA Area:     12.50 cm  LA Vol (A2C):   46.7 ml 29.76 ml/m  RA Volume:   30.20 ml  19.24 ml/m  LA Vol (A4C):   59.7 ml 38.04 ml/m  LA Biplane Vol: 55.6 ml 35.43 ml/m   AORTIC VALVE                  PULMONIC VALVE  AV Area (Vmax): 2.14 cm     PV Vmax:       0.86 m/s  AV Vmax:        148.00 cm/s  PV Peak grad:  3.0 mmHg  AV Peak Grad:   8.8 mmHg  LVOT Vmax:      101.00 cm/s  LVOT Vmean:     60.100 cm/s  LVOT VTI:       0.168 m    AORTA  Ao Root diam: 3.20 cm  Ao Asc diam:  2.50 cm   MITRAL VALVE                TRICUSPID VALVE  MV Area (PHT): 4.86 cm     TR Peak grad:   19.5 mmHg  MV Decel Time: 156 msec     TR Vmax:        221.00 cm/s  MV E velocity: 61.60 cm/s  MV A velocity: 116.00 cm/s  SHUNTS  MV E/A ratio:  0.53         Systemic VTI:  0.17 m                              Systemic Diam: 2.00 cm   Skeet Latch MD  Electronically signed by Skeet Latch MD  Signature Date/Time: 08/07/2022/11:46:52 AM       Laboratory Data:  High Sensitivity Troponin:   Recent Labs  Lab 10/28/22 2212 10/29/22 0034 10/29/22 0339 10/29/22 0539  TROPONINIHS 1,763* 2,314* 3,449* 3,172*     Chemistry Recent Labs  Lab 10/28/22 2212 10/29/22 0539  NA 140  --   K 4.3  --   CL 106  --   CO2 20*  --   GLUCOSE 110*  --   BUN 58*  --   CREATININE 3.99*  --   CALCIUM 9.3  --   MG  --  1.9  GFRNONAA 11*  --   ANIONGAP 14  --     Recent Labs  Lab 10/28/22 2212  PROT 6.8  ALBUMIN 3.3*  AST 13*  ALT 12  ALKPHOS 78  BILITOT 0.6   Lipids No results for input(s): "CHOL", "TRIG", "HDL", "LABVLDL", "LDLCALC", "CHOLHDL" in the last 168 hours.  Hematology Recent Labs  Lab 10/28/22 2212 10/29/22 0647  WBC 7.9 7.4  RBC 3.02* 2.79*  HGB  8.4* 7.7*  HCT 26.0* 24.5*  MCV 86.1 87.8  MCH 27.8 27.6  MCHC 32.3 31.4  RDW 14.7 14.6  PLT 186 169   Thyroid No results for input(s): "TSH", "FREET4" in the last 168 hours.  BNP Recent Labs  Lab 10/28/22 2212  BNP 559.7*    DDimer No results for input(s): "DDIMER" in the last 168 hours.   Radiology/Studies:  MR BRAIN WO CONTRAST  Result Date: 10/29/2022 CLINICAL DATA:  Neuro deficit with acute stroke suspected. History  of laryngectomy for cancer EXAM: MRI HEAD WITHOUT CONTRAST TECHNIQUE: Multiplanar, multiecho pulse sequences of the brain and surrounding structures were obtained without intravenous contrast. COMPARISON:  Head CT from earlier today FINDINGS: Brain: No acute infarction, hemorrhage, hydrocephalus, extra-axial collection or mass lesion. Vascular: Normal flow voids. Skull and upper cervical spine: No focal marrow lesion Sinuses/Orbits: Chronic right sphenoid sinusitis with partial opacification by CT. Other: Laryngectomy seen on sagittal T1 weighted images. IMPRESSION: No acute finding.  Negative for infarct. Electronically Signed   By: Jorje Guild M.D.   On: 10/29/2022 04:27   CT HEAD WO CONTRAST  Result Date: 10/28/2022 CLINICAL DATA:  Neuro deficit, acute stroke suspected. EXAM: CT HEAD WITHOUT CONTRAST TECHNIQUE: Contiguous axial images were obtained from the base of the skull through the vertex without intravenous contrast. RADIATION DOSE REDUCTION: This exam was performed according to the departmental dose-optimization program which includes automated exposure control, adjustment of the mA and/or kV according to patient size and/or use of iterative reconstruction technique. COMPARISON:  CT examination dated September 02, 2022. FINDINGS: Brain: No evidence of acute infarction, hemorrhage, hydrocephalus, extra-axial collection or mass lesion/mass effect. Vascular: No hyperdense vessel or unexpected calcification. Skull: Normal. Negative for fracture or focal lesion. Sinuses/Orbits: No acute finding. Other: None. IMPRESSION: No acute intracranial pathology. Electronically Signed   By: Keane Police D.O.   On: 10/28/2022 23:59   DG Chest Portable 1 View  Result Date: 10/28/2022 CLINICAL DATA:  Shortness of breath. EXAM: PORTABLE CHEST 1 VIEW COMPARISON:  Chest radiograph dated September 08, 2022. FINDINGS: The heart is mildly enlarged. Prominent atherosclerotic calcification of the aortic arch. Biapical  pleural/parenchymal scarring. No focal consolidation or pleural effusion. IMPRESSION: Mild cardiomegaly. No focal consolidation or pleural effusion. Electronically Signed   By: Keane Police D.O.   On: 10/28/2022 22:30     Assessment and Plan:   NSTEMI  # Chest pain  Patient continues to endorse anterior chest pain, nonradiating, no associated nausea, vomiting, diaphoresis, palpitations, dizziness.  Troponin levels were elevated 1,763>> 2,314>>3,449>>3.172. EKG reflected normal sinus rhythm. Trace bibasilar coarse crackles to auscultation.  No pedal edema or signs of volume overload.  I have no concerns for CHF.  However presentation consistent with ACS.  Patient had has CKD stage 5 not on dialysis, with serum creatinine 3.9 she is not a candidate for cardiac cath.  I have discussed the potential risks and benefits of cardiac catheterization with daughter-in-law including risk of progression to ESRD.  Considering comorbidities and poor renal function I anticipate best approach is medical management. Patient is on anticoagulation with apixaban and ASA. I will discuss with Dr Ali Lowe for further recommendations and management plan. -Continue Apixaban 2.5 mg BID -Continue Asa 81 mg -Increase Rosuvastatin to 40 mg.  2) CKD Stage V   Not on dialysis (Pt declined to be on dialysis). No signs of volume overload. Lungs CTA. Abdomen soft, non distended. No pedal edema. -S. Cr: 3.9, GFR 11. Base line S Cr >4.0 -Continue  to monitor with repeat BMP    3) HTN: BP above goal 130/80 Continue home BP treatment regimen -Carvedilol 25 mg -Clonidine 0.1 mg -Increase Hydralazine 125 mg - continue Imdur 60 mg   4) HFpEF: BNP : >500. Baseline BNP  130 on previous visits. Although BNP was noted higher on previous visits.  No SOB. Mild bibasilar coarse crackles. No pedal edema. No signs of volume overload.  Blood pressure management with home antihypertensive regimen. Although no obvious signs of volume  overload, she may benefit form diuretic in the setting of chest pain ECHO 08/07/2022: Left ventricular ejection fraction, by estimation, is >75%. The left ventricle has hyperdynamic function. The left ventricle has no regional wall motion abnormalities.    Risk Assessment/Risk Scores:     TIMI Risk Score for Unstable Angina or Non-ST Elevation MI:   The patient's TIMI risk score is 4, which indicates a 20% risk of all cause mortality, new or recurrent myocardial infarction or need for urgent revascularization in the next 14 days.    CHA2DS2-VASc Score = 8   This indicates a 10.8% annual risk of stroke. The patient's score is based upon: CHF History: 1 HTN History: 1 Diabetes History: 0 Stroke History: 2 Vascular Disease History: 1 Age Score: 2 Gender Score: 1         For questions or updates, please contact [place] Please consult [email or web address] for contact info under    Signed, Teola Bradley, MD  10/29/2022 10:00 AM  [id number]  ATTENDING ATTESTATION:  After conducting a review of all available clinical information with the care team, interviewing the patient, and performing a physical exam, I agree with the findings and plan described in this note.   GEN: No acute distress.   Frail.  HEENT:  MMM, no JVD, no scleral icterus; trach in place Cardiac: RRR, no murmurs, rubs, or gallops.  Respiratory: Mild rales GI: Soft, nontender, non-distended  MS: No edema; No deformity. Neuro:  Nonfocal  Vasc:  +2 radial pulses  The patient is a 78F with multiple comorbid conditions including frailty, CKD4/5 not desiring HD, AF on Eliquis, COPD, history of head neck cancer status post laryngectomy with stoma and GERD here with hypertensive urgency and chest pain.  Her BP remains elevated.  She is not a candidate for invasive procedures.  Would aggressively treat BP; increase hydralazine to '125mg'$  and add nitro gtt if needed.  Discussed at length with son and her poor  prognosis.  I would obtain palliative care consult.  No further cardiac evaluation is necessary.   Lenna Sciara, MD Pager [phone]

## 2022-10-29 NOTE — Progress Notes (Addendum)
PROGRESS NOTE    [name]  WHQ:[id number] DOB: 11-Dec-1946 DOA: 10/28/2022 PCP: Hoyt Koch, MD   Brief Narrative: 76 year old with past medical history significant for head and neck cancer, A-fib, hypertension, COPD, CKD V, hypothyroidism, CVA who presents complaining of chest pain, elevation of troponin found to have non-STEMI.  Patient was evaluated by cardiology who is recommending medical management.  Plan to continue with aspirin and Eliquis.  Patient started on Imdur.     Assessment & Plan:   Principal Problem:   NSTEMI (non-ST elevated myocardial infarction) ([place]) Active Problems:   Hypertension   GAD (generalized anxiety disorder)   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   COPD (chronic obstructive pulmonary disease) (HCC)   Chronic pain syndrome   History of radiation therapy   Dysphagia, pharyngoesophageal phase   PAD (peripheral artery disease) (HCC)   Hypothyroidism   HLD (hyperlipidemia)   Moderate protein-calorie malnutrition (HCC)   S/P laryngectomy   PAF (paroxysmal atrial fibrillation)-CHADS2 Vas score of 5   History of DVT (deep vein thrombosis)   Acute on chronic diastolic CHF (congestive heart failure) ([place])   1-NSTEMI;  Patient presents with chest pain, elevation of troponin 3007. Found to have non-STEMI.  Evaluated by cardiology who is recommending medical management.  Patient is not a candidate for cath due to CKD. Continue with carvedilol, aspirin, Eliquis.  Started on Imdur. Cardiology Increased  hydralazine dose.  Palliative care consulted per cardiology recommendations.   2-CKD Stage V: Prior cr 3.9---4.2. Per records.  Patient has Stage V, prior GFR at 11.  Labs pending for this morning.  Continue to monitor.   3-Chronic Diastolic HF;  Received IV lasix in the ED>  Discussed with cardiology hold diuretics.   4-PAF; continue with eliquis.   COPD; start Guaifenesin. Schedule nebulizer. Flutter valve HTN;  continue with hydralazine dose increase to 125 3 times daily, continue with Coreg, clonidine and minoxidil  Hypothyroidism: Continue with Synthroid  Hyperlipidemia: continue with Crestor  Protein caloric malnutrition; start ensure.  Dysphagia.  H/O [facility] Per family patient eats regular diet.  Will get speech   Left hand weakness, dysphagia.  MRI negative for stroke.  Anemia; monitor hb  Estimated body mass index is 20.78 kg/m as calculated from the following:   Height as of this encounter: '5\' 1"'$  (1.549 m).   Weight as of this encounter: 49.9 kg.   DVT prophylaxis:  Code Status: patient wishes to be DNR, family at bedside and over phone.  Family Communication: son at bedside and over phone.  Disposition Plan:  Status is: Inpatient Remains inpatient appropriate because: management of NSTEMI    Consultants:  Cardiology   Procedures:    Antimicrobials:    Subjective: She report chest pain. I was able to speak with patient in her native language. I read her lips.  She report chest congestion. Thick secretions.   Objective: Vitals:   10/29/22 0642 10/29/22 0700 10/29/22 0730 10/29/22 0800  BP:  (!) 165/74 (!) 157/69 (!) 171/74  Pulse:  76 71 74  Resp:  '18 20 19  '$ Temp: 98.8 F (37.1 C)     TempSrc: Oral     SpO2:  93% 93% 93%  Weight:      Height:       No intake or output data in the 24 hours ending 10/29/22 0926 Filed Weights   10/28/22 2318  Weight: 49.9 kg    Examination:  General exam: Appears calm and comfortable , trach  ostomy in placed.  Respiratory system: BL ronchus.  Cardiovascular system: S1 & S2 heard, RRR. [name] Gastrointestinal system: Abdomen is nondistended, soft and nontender. No organomegaly or masses felt. Normal bowel sounds heard. Central nervous system: Alert and oriented.  Extremities: Symmetric 5 x 5 power.    Data Reviewed: I have personally reviewed following labs and imaging studies  CBC: Recent Labs  Lab 10/28/22 2212  10/29/22 0647  WBC 7.9 7.4  NEUTROABS 5.8  --   HGB 8.4* 7.7*  HCT 26.0* 24.5*  MCV 86.1 87.8  PLT 186 409   Basic Metabolic Panel: Recent Labs  Lab 10/28/22 2212 10/29/22 0539  NA 140  --   K 4.3  --   CL 106  --   CO2 20*  --   GLUCOSE 110*  --   BUN 58*  --   CREATININE 3.99*  --   CALCIUM 9.3  --   MG  --  1.9   GFR: Estimated Creatinine Clearance: 9.1 mL/min (A) (by C-G formula based on SCr of 3.99 mg/dL (H)). Liver Function Tests: Recent Labs  Lab 10/28/22 2212  AST 13*  ALT 12  ALKPHOS 78  BILITOT 0.6  PROT 6.8  ALBUMIN 3.3*   No results for input(s): "LIPASE", "AMYLASE" in the last 168 hours. No results for input(s): "AMMONIA" in the last 168 hours. Coagulation Profile: Recent Labs  Lab 10/28/22 2212  INR 1.5*   Cardiac Enzymes: No results for input(s): "CKTOTAL", "CKMB", "CKMBINDEX", "TROPONINI" in the last 168 hours. BNP (last 3 results) Recent Labs    10/20/22 0902  PROBNP 106.0*   HbA1C: No results for input(s): "HGBA1C" in the last 72 hours. CBG: No results for input(s): "GLUCAP" in the last 168 hours. Lipid Profile: No results for input(s): "CHOL", "HDL", "LDLCALC", "TRIG", "CHOLHDL", "LDLDIRECT" in the last 72 hours. Thyroid Function Tests: No results for input(s): "TSH", "T4TOTAL", "FREET4", "T3FREE", "THYROIDAB" in the last 72 hours. Anemia Panel: No results for input(s): "VITAMINB12", "FOLATE", "FERRITIN", "TIBC", "IRON", "RETICCTPCT" in the last 72 hours. Sepsis Labs: No results for input(s): "PROCALCITON", "LATICACIDVEN" in the last 168 hours.  Recent Results (from the past 240 hour(s))  Resp Panel by RT-PCR (Flu A&B, Covid) Anterior Nasal Swab     Status: None   Collection Time: 10/28/22 11:19 PM   Specimen: Anterior Nasal Swab  Result Value Ref Range Status   SARS Coronavirus 2 by RT PCR NEGATIVE NEGATIVE Final    Comment: (NOTE) SARS-CoV-2 target nucleic acids are NOT DETECTED.  The SARS-CoV-2 RNA is generally  detectable in upper respiratory specimens during the acute phase of infection. The lowest concentration of SARS-CoV-2 viral copies this assay can detect is 138 copies/mL. A negative result does not preclude SARS-Cov-2 infection and should not be used as the sole basis for treatment or other patient management decisions. A negative result may occur with  improper specimen collection/handling, submission of specimen other than nasopharyngeal swab, presence of viral mutation(s) within the areas targeted by this assay, and inadequate number of viral copies(<138 copies/mL). A negative result must be combined with clinical observations, patient history, and epidemiological information. The expected result is Negative.  Fact Sheet for Patients:  EntrepreneurPulse.com.au  Fact Sheet for Healthcare Providers:  IncredibleEmployment.be  This test is no t yet approved or cleared by the [place] FDA and  has been authorized for detection and/or diagnosis of SARS-CoV-2 by FDA under an Emergency Use Authorization (EUA). This EUA will remain  in effect (meaning this test  can be used) for the duration of the COVID-19 declaration under Section 564(b)(1) of the Act, 21 U.S.C.section 360bbb-3(b)(1), unless the authorization is terminated  or revoked sooner.       Influenza A by PCR NEGATIVE NEGATIVE Final   Influenza B by PCR NEGATIVE NEGATIVE Final    Comment: (NOTE) The Xpert Xpress SARS-CoV-2/FLU/RSV plus assay is intended as an aid in the diagnosis of influenza from Nasopharyngeal swab specimens and should not be used as a sole basis for treatment. Nasal washings and aspirates are unacceptable for Xpert Xpress SARS-CoV-2/FLU/RSV testing.  Fact Sheet for Patients: EntrepreneurPulse.com.au  Fact Sheet for Healthcare Providers: IncredibleEmployment.be  This test is not yet approved or cleared by the [place] FDA  and has been authorized for detection and/or diagnosis of SARS-CoV-2 by FDA under an Emergency Use Authorization (EUA). This EUA will remain in effect (meaning this test can be used) for the duration of the COVID-19 declaration under Section 564(b)(1) of the Act, 21 U.S.C. section 360bbb-3(b)(1), unless the authorization is terminated or revoked.  Performed at [facility], [place] [address] [place], [place] [address]          Radiology Studies: MR BRAIN WO CONTRAST  Result Date: 10/29/2022 CLINICAL DATA:  Neuro deficit with acute stroke suspected. History of laryngectomy for cancer EXAM: MRI HEAD WITHOUT CONTRAST TECHNIQUE: Multiplanar, multiecho pulse sequences of the brain and surrounding structures were obtained without intravenous contrast. COMPARISON:  Head CT from earlier today FINDINGS: Brain: No acute infarction, hemorrhage, hydrocephalus, extra-axial collection or mass lesion. Vascular: Normal flow voids. Skull and upper cervical spine: No focal marrow lesion Sinuses/Orbits: Chronic right sphenoid sinusitis with partial opacification by CT. Other: Laryngectomy seen on sagittal T1 weighted images. IMPRESSION: No acute finding.  Negative for infarct. Electronically Signed   By: Jorje Guild M.D.   On: 10/29/2022 04:27   CT HEAD WO CONTRAST  Result Date: 10/28/2022 CLINICAL DATA:  Neuro deficit, acute stroke suspected. EXAM: CT HEAD WITHOUT CONTRAST TECHNIQUE: Contiguous axial images were obtained from the base of the skull through the vertex without intravenous contrast. RADIATION DOSE REDUCTION: This exam was performed according to the departmental dose-optimization program which includes automated exposure control, adjustment of the mA and/or kV according to patient size and/or use of iterative reconstruction technique. COMPARISON:  CT examination dated September 02, 2022. FINDINGS: Brain: No evidence of acute infarction, hemorrhage, hydrocephalus, extra-axial collection or  mass lesion/mass effect. Vascular: No hyperdense vessel or unexpected calcification. Skull: Normal. Negative for fracture or focal lesion. Sinuses/Orbits: No acute finding. Other: None. IMPRESSION: No acute intracranial pathology. Electronically Signed   By: Keane Police D.O.   On: 10/28/2022 23:59   DG Chest Portable 1 View  Result Date: 10/28/2022 CLINICAL DATA:  Shortness of breath. EXAM: PORTABLE CHEST 1 VIEW COMPARISON:  Chest radiograph dated September 08, 2022. FINDINGS: The heart is mildly enlarged. Prominent atherosclerotic calcification of the aortic arch. Biapical pleural/parenchymal scarring. No focal consolidation or pleural effusion. IMPRESSION: Mild cardiomegaly. No focal consolidation or pleural effusion. Electronically Signed   By: Keane Police D.O.   On: 10/28/2022 22:30        Scheduled Meds:  apixaban  2.5 mg Oral BID   [START ON 10/30/2022] aspirin EC  81 mg Oral Daily   carvedilol  25 mg Oral BID WC   cilostazol  100 mg Oral BID   [START ON 10/30/2022] cloNIDine  0.1 mg Transdermal Q Fri   DULoxetine  20 mg Oral Daily  hydrALAZINE  100 mg Oral Q8H   isosorbide mononitrate  60 mg Oral BID   levothyroxine  50 mcg Oral QAC breakfast   minoxidil  2.5 mg Oral BID   pantoprazole  40 mg Oral Daily   rosuvastatin  20 mg Oral Daily   Continuous Infusions:   LOS: 0 days    Time spent: 35 minutes    Ann Boven A Neaveh Belanger, MD [facility]   If 7PM-7AM, please contact night-coverage [email or web address]  10/29/2022, 9:26 AM

## 2022-10-29 NOTE — ED Notes (Signed)
Attempted to call son x2 for cardiologist. No answer.

## 2022-10-29 NOTE — ED Notes (Signed)
Patient transported to MRI 

## 2022-10-29 NOTE — ED Notes (Signed)
Pt transported to mri 

## 2022-10-29 NOTE — ED Notes (Signed)
Went to move pt and MD at bedside. Will move when MD is finished talking with pt.

## 2022-10-29 NOTE — ED Notes (Signed)
Cardiologist (Dr. Jeanett Schlein at bedside) to speak with pt and family.

## 2022-10-29 NOTE — ED Notes (Signed)
Interpreter [phone]

## 2022-10-29 NOTE — Plan of Care (Signed)
Brief curbside from ED PA, chart was not fully reviewed   In brief patient with tracheostomy which limits history taking, with some acute onset difficulty swallowing today and left hand weakness, in the setting of NSTEMI for which cardiology is following.  She did already take her evening dose of apixaban 2.5 mg today, but she is still having some difference in her left face subjective sensation that is difficult to clarify.  Otherwise she is at her baseline without new neurological deficits and her head CT is negative  Question for neurology is safety of initiating anticoagulation for concern for NSTEMI with the possibility of an acute stroke  Overall likely low risk given small deficit based on symptoms described.  Additionally she is already currently therapeutically anticoagulated as she took her evening medications prior to arrival to the ED She does have an MRI brain pending.  If this is negative, there is no neurological contraindication.  If this is positive, please reach out for full consultation  These are curbside recommendations only based on information provided to me verbally, neurology is available for full consultation if needed please do reach out  [place] [phone] Available 7 PM to 7 AM, outside of these hours please call Neurologist on call as listed on Amion.

## 2022-10-29 NOTE — ED Notes (Signed)
Pt in no apparent distress at this time and respirations equal and unlabored since being suctioned by RT. O2 96% RA

## 2022-10-29 NOTE — ED Notes (Signed)
Pt refused mri, MD Horton made aware

## 2022-10-29 NOTE — ED Notes (Signed)
Family at bedside. 

## 2022-10-29 NOTE — ED Notes (Signed)
ED TO INPATIENT HANDOFF REPORT  ED Nurse Name and Phone #:   S Name/Age/Gender [name] 76 y.o. female Room/Bed: 010C/010C  Code Status   Code Status: DNR  Home/SNF/Other Home Patient oriented to: self, place, time, and situation Is this baseline? Yes   Triage Complete: Triage complete  Chief Complaint NSTEMI (non-ST elevated myocardial infarction) [facility]) [I21.4]  Triage Note Pt bib [facility]ms from home. Walked into kitchen around 8pm with family clutching chest saying it was hard to breath and she was having pain. Per family pt sweaty and weak in the legs. Pt c/o 9/10 pain with ems no sweatiness with ems but weakness in BLE. EMS gave 3 nitro tablets with continuous 8-9/10 chest pain and 324 mg of asa. Pt currenty has a trach in place. Ems vitals 262/90  98% 02   Allergies Allergies  Allergen Reactions   Xyzal [Levocetirizine Dihydrochloride] Itching   Augmentin [Amoxicillin-Pot Clavulanate] Diarrhea and Nausea And Vomiting   Tribenzor [Olmesartan-Amlodipine-Hctz] Other (See Comments)    "Hurt the kidneys"    Level of Care/Admitting Diagnosis ED Disposition     ED Disposition  Admit   Condition  --   [place]: [place] [[id number]]  Level of Care: Progressive [102]  Admit to Progressive based on following criteria: CARDIOVASCULAR & THORACIC of moderate stability with acute coronary syndrome symptoms/low risk myocardial infarction/hypertensive urgency/arrhythmias/heart failure potentially compromising stability and stable post cardiovascular intervention patients.  May admit patient to [facility] or [facility] if equivalent level of care is available:: Yes  Covid Evaluation: Asymptomatic - no recent exposure (last 10 days) testing not required  Diagnosis: NSTEMI (non-ST elevated myocardial infarction) [facility]) [[id number]]  Admitting Physician: [place], [place]  Attending Physician: Quintella Baton [[id number]]  Certification:: I certify  this patient will need inpatient services for at least 2 midnights  Estimated Length of Stay: 2          B Medical/Surgery History Past Medical History:  Diagnosis Date   Anemia    Anxiety    takes Ativan prn   Blood transfusion without reported diagnosis 09/15/12   2 units Prbc's   Broken ribs    Chronic back pain    CKD (chronic kidney disease), stage IV (HCC)    Constipation    related to pain meds   COPD (chronic obstructive pulmonary disease) ([place]) 08/10/2012   denies   Depression    Gastrostomy in place [facility])    removed   GERD (gastroesophageal reflux disease)    takes Zantac daily   Headache(784.0)    Hiatal hernia 08/10/2012   History of radiation therapy 10/17/12-11/25/12   supraglottic larynx,high risk neck tumor bed 5880 cGy/28 sessions, high risk lymph node tumor bed 5600 cGy/20 sessions, mod risk lymph node tumor bed 5040 cGy/20 sessions   Hypercholesteremia    takes Pravastatin daily   Hypertension    takes Tribenzor and Atenolol daily   Insomnia    takes Amitriptyline daily   Nausea    takes Zofran prn   PAD (peripheral artery disease) ([place])    noninvasive imaging in 2016   Pneumonia    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 08/08/2012   Shortness of breath dyspnea    Stroke [facility]) 2011   denies. no residual   Uterine cancer [facility])    Past Surgical History:  Procedure Laterality Date   ABDOMINAL SURGERY     r/t uterine carcinoma   APPENDECTOMY     DIRECT  LARYNGOSCOPY N/A 05/22/2014   Procedure: DIRECT LARYNGOSCOPY WITH ESOPHAGEAL DILATION;  Surgeon: Jerrell Belfast, MD;  Location: [facility] OR;  Service: ENT;  Laterality: N/A;   ESOPHAGOSCOPY WITH DILITATION N/A 05/29/2015   Procedure: ESOPHAGOSCOPY WITH ESOPHAGEAL DILITATION;  Surgeon: Jerrell Belfast, MD;  Location: [place];  Service: ENT;  Laterality: N/A;   Gastrostomy Tube removed   2013   GASTROSTOMY W/ FEEDING TUBE  13   HERNIA REPAIR     child   LARYNGETOMY  08/31/2012   Procedure:  LARYNGECTOMY;  Surgeon: Jerrell Belfast, MD;  Location[facility] OR;  Service: ENT;  Laterality: N/A;   LARYNGOSCOPY  08/10/2012   Procedure: LARYNGOSCOPY;  Surgeon: Jerrell Belfast, MD;  Location: [facility] ORS;  Service: ENT;  Laterality: N/A;  with biopsy   RADICAL NECK DISSECTION  08/31/2012   Procedure: RADICAL NECK DISSECTION;  Surgeon: Jerrell Belfast, MD;  Location: [facility] OR;  Service: ENT;  Laterality: Bilateral;   TRACHEAL ESOPHOGEAL PUNCTURE WITH REPAIR STOMA N/A 09/08/2013   Procedure: TRACHEAL ESOPHOGEAL PUNCTURE WITH PLACEMENT OF  [place] ;  Surgeon: Jerrell Belfast, MD;  Location: [facility] OR;  Service: ENT;  Laterality: N/A;   TRACHEOSTOMY TUBE PLACEMENT  08/10/2012   Procedure: TRACHEOSTOMY;  Surgeon: Jerrell Belfast, MD;  Location: [facility] ORS;  Service: ENT;  Laterality: N/A;     A IV Location/Drains/Wounds Patient Lines/Drains/Airways Status     Active Line/Drains/Airways     Name Placement date Placement time Site Days   Peripheral IV 10/28/22 18 G Right Antecubital 10/28/22  2142  Antecubital  1   Incision 08/10/12 Neck  08/10/12  1526  -- 3732   Incision 08/31/12 Neck Other (Comment) 08/31/12  1550  -- 3711   Incision 09/08/13 N/A Other (Comment) 09/08/13  0800  -- 3338   Tracheostomy   Laryngectomy --  --  --  --            Intake/Output Last 24 hours No intake or output data in the 24 hours ending 10/29/22 1524  Labs/Imaging Results for orders placed or performed during the hospital encounter of 10/28/22 (from the past 48 hour(s))  CBC with Differential     Status: Abnormal   Collection Time: 10/28/22 10:12 PM  Result Value Ref Range   WBC 7.9 4.0 - 10.5 K/uL   RBC 3.02 (L) 3.87 - 5.11 MIL/uL   Hemoglobin 8.4 (L) 12.0 - 15.0 g/dL   HCT 26.0 (L) 36.0 - 46.0 %   MCV 86.1 80.0 - 100.0 fL   MCH 27.8 26.0 - 34.0 pg   MCHC 32.3 30.0 - 36.0 g/dL   RDW 14.7 11.5 - 15.5 %   Platelets 186 150 - 400 K/uL   nRBC 0.0 0.0 - 0.2 %   Neutrophils Relative % 73 %   Neutro Abs 5.8  1.7 - 7.7 K/uL   Lymphocytes Relative 16 %   Lymphs Abs 1.3 0.7 - 4.0 K/uL   Monocytes Relative 8 %   Monocytes Absolute 0.6 0.1 - 1.0 K/uL   Eosinophils Relative 2 %   Eosinophils Absolute 0.1 0.0 - 0.5 K/uL   Basophils Relative 0 %   Basophils Absolute 0.0 0.0 - 0.1 K/uL   Immature Granulocytes 1 %   Abs Immature Granulocytes 0.04 0.00 - 0.07 K/uL    Comment: Performed at [facility], [place]. [address], [place], [place] [address]  Troponin I (High Sensitivity)     Status: Abnormal   Collection Time: 10/28/22 10:12 PM  Result Value  Ref Range   Troponin I (High Sensitivity) 1,763 (HH) <18 ng/L    Comment: CRITICAL RESULT CALLED TO, READ BACK BY AND VERIFIED WITH A. DECHAMBEAU, RN AT 2340 ON 10/28/22 BY H. HOWARD. (NOTE) Elevated high sensitivity troponin I (hsTnI) values and significant  changes across serial measurements may suggest ACS but many other  chronic and acute conditions are known to elevate hsTnI results.  Refer to the "Links" section for chest pain algorithms and additional  guidance. Performed at [facility], [place] [address], [place], [place] [address]   Basic metabolic panel     Status: Abnormal   Collection Time: 10/28/22 10:12 PM  Result Value Ref Range   Sodium 140 135 - 145 mmol/L   Potassium 4.3 3.5 - 5.1 mmol/L   Chloride 106 98 - 111 mmol/L   CO2 20 (L) 22 - 32 mmol/L   Glucose, Bld 110 (H) 70 - 99 mg/dL    Comment: Glucose reference range applies only to samples taken after fasting for at least 8 hours.   BUN 58 (H) 8 - 23 mg/dL   Creatinine, Ser 3.99 (H) 0.44 - 1.00 mg/dL   Calcium 9.3 8.9 - 10.3 mg/dL   GFR, Estimated 11 (L) >60 mL/min    Comment: (NOTE) Calculated using the CKD-EPI Creatinine Equation (2021)    Anion gap 14 5 - 15    Comment: Performed at [facility] [address], [place], [place] [address]  Brain natriuretic peptide     Status: Abnormal   Collection Time: 10/28/22 10:12 PM  Result Value Ref Range   B  Natriuretic Peptide 559.7 (H) 0.0 - 100.0 pg/mL    Comment: Performed at [facility] [address] [place], [place] [address]  Ethanol     Status: None   Collection Time: 10/28/22 10:12 PM  Result Value Ref Range   Alcohol, Ethyl (B) <10 <10 mg/dL    Comment: (NOTE) Lowest detectable limit for serum alcohol is 10 mg/dL.  For medical purposes only. Performed at [facility], [place] [address], [place], [place] [address]   Protime-INR     Status: Abnormal   Collection Time: 10/28/22 10:12 PM  Result Value Ref Range   Prothrombin Time 18.1 (H) 11.4 - 15.2 seconds   INR 1.5 (H) 0.8 - 1.2    Comment: (NOTE) INR goal varies based on device and disease states. Performed at [facility], [place] [address] [place], [place] [address]   APTT     Status: None   Collection Time: 10/28/22 10:12 PM  Result Value Ref Range   aPTT 35 24 - 36 seconds    Comment: Performed at [facility] [address], [place], [place] [address]  Hepatic function panel     Status: Abnormal   Collection Time: 10/28/22 10:12 PM  Result Value Ref Range   Total Protein 6.8 6.5 - 8.1 g/dL   Albumin 3.3 (L) 3.5 - 5.0 g/dL   AST 13 (L) 15 - 41 U/L   ALT 12 0 - 44 U/L   Alkaline Phosphatase 78 38 - 126 U/L   Total Bilirubin 0.6 0.3 - 1.2 mg/dL   Bilirubin, Direct 0.1 0.0 - 0.2 mg/dL   Indirect Bilirubin 0.5 0.3 - 0.9 mg/dL    Comment: Performed at [facility] [address] [place], [place] [address]  Resp Panel by RT-PCR (Flu A&B, Covid) Anterior Nasal Swab  Status: None   Collection Time: 10/28/22 11:19 PM   Specimen: Anterior Nasal Swab  Result Value Ref Range   SARS Coronavirus 2 by RT PCR NEGATIVE NEGATIVE    Comment: (NOTE) SARS-CoV-2 target nucleic acids are NOT DETECTED.  The SARS-CoV-2 RNA is generally detectable in upper respiratory specimens during the acute phase of infection. The lowest concentration of SARS-CoV-2 viral copies this assay can detect is 138  copies/mL. A negative result does not preclude SARS-Cov-2 infection and should not be used as the sole basis for treatment or other patient management decisions. A negative result may occur with  improper specimen collection/handling, submission of specimen other than nasopharyngeal swab, presence of viral mutation(s) within the areas targeted by this assay, and inadequate number of viral copies(<138 copies/mL). A negative result must be combined with clinical observations, patient history, and epidemiological information. The expected result is Negative.  Fact Sheet for Patients:  EntrepreneurPulse.com.au  Fact Sheet for Healthcare Providers:  IncredibleEmployment.be  This test is no t yet approved or cleared by the [place] FDA and  has been authorized for detection and/or diagnosis of SARS-CoV-2 by FDA under an Emergency Use Authorization (EUA). This EUA will remain  in effect (meaning this test can be used) for the duration of the COVID-19 declaration under Section 564(b)(1) of the Act, 21 U.S.C.section 360bbb-3(b)(1), unless the authorization is terminated  or revoked sooner.       Influenza A by PCR NEGATIVE NEGATIVE   Influenza B by PCR NEGATIVE NEGATIVE    Comment: (NOTE) The Xpert Xpress SARS-CoV-2/FLU/RSV plus assay is intended as an aid in the diagnosis of influenza from Nasopharyngeal swab specimens and should not be used as a sole basis for treatment. Nasal washings and aspirates are unacceptable for Xpert Xpress SARS-CoV-2/FLU/RSV testing.  Fact Sheet for Patients: EntrepreneurPulse.com.au  Fact Sheet for Healthcare Providers: IncredibleEmployment.be  This test is not yet approved or cleared by the [place] FDA and has been authorized for detection and/or diagnosis of SARS-CoV-2 by FDA under an Emergency Use Authorization (EUA). This EUA will remain in effect (meaning this test  can be used) for the duration of the COVID-19 declaration under Section 564(b)(1) of the Act, 21 U.S.C. section 360bbb-3(b)(1), unless the authorization is terminated or revoked.  Performed at [facility], [place] [address] [place], [place] [address]   Troponin I (High Sensitivity)     Status: Abnormal   Collection Time: 10/29/22 12:34 AM  Result Value Ref Range   Troponin I (High Sensitivity) 2,314 (HH) <18 ng/L    Comment: CRITICAL VALUE NOTED.  VALUE IS CONSISTENT WITH PREVIOUSLY REPORTED AND CALLED VALUE. (NOTE) Elevated high sensitivity troponin I (hsTnI) values and significant  changes across serial measurements may suggest ACS but many other  chronic and acute conditions are known to elevate hsTnI results.  Refer to the "Links" section for chest pain algorithms and additional  guidance. Performed at [facility], [place] [address], [place], [place] [address]   Troponin I (High Sensitivity)     Status: Abnormal   Collection Time: 10/29/22  3:39 AM  Result Value Ref Range   Troponin I (High Sensitivity) 3,449 (HH) <18 ng/L    Comment: CRITICAL VALUE NOTED. VALUE IS CONSISTENT WITH PREVIOUSLY REPORTED/CALLED VALUE (NOTE) Elevated high sensitivity troponin I (hsTnI) values and significant  changes across serial measurements may suggest ACS but many other  chronic and acute conditions are known to elevate hsTnI results.  Refer to the "Links" section for  chest pain algorithms and additional  guidance. Performed at [facility], [place] [address] [place], [place] [address]   Urine rapid drug screen (hosp performed)     Status: None   Collection Time: 10/29/22  3:58 AM  Result Value Ref Range   Opiates NONE DETECTED NONE DETECTED   Cocaine NONE DETECTED NONE DETECTED   Benzodiazepines NONE DETECTED NONE DETECTED   Amphetamines NONE DETECTED NONE DETECTED   Tetrahydrocannabinol NONE DETECTED NONE DETECTED   Barbiturates NONE DETECTED NONE DETECTED    Comment:  (NOTE) DRUG SCREEN FOR MEDICAL PURPOSES ONLY.  IF CONFIRMATION IS NEEDED FOR ANY PURPOSE, NOTIFY LAB WITHIN 5 DAYS.  LOWEST DETECTABLE LIMITS FOR URINE DRUG SCREEN Drug Class                     Cutoff (ng/mL) Amphetamine and metabolites    1000 Barbiturate and metabolites    200 Benzodiazepine                 200 Opiates and metabolites        300 Cocaine and metabolites        300 THC                            50 Performed at [facility], [place] [address], [place], [place] [address]   Urinalysis, Routine w reflex microscopic     Status: Abnormal   Collection Time: 10/29/22  3:58 AM  Result Value Ref Range   Color, Urine YELLOW YELLOW   APPearance CLEAR CLEAR   Specific Gravity, Urine 1.010 1.005 - 1.030   pH 6.0 5.0 - 8.0   Glucose, UA NEGATIVE NEGATIVE mg/dL   Hgb urine dipstick NEGATIVE NEGATIVE   Bilirubin Urine NEGATIVE NEGATIVE   Ketones, ur NEGATIVE NEGATIVE mg/dL   Protein, ur >=300 (A) NEGATIVE mg/dL   Nitrite NEGATIVE NEGATIVE   Leukocytes,Ua NEGATIVE NEGATIVE   RBC / HPF 0-5 0 - 5 RBC/hpf   WBC, UA 0-5 0 - 5 WBC/hpf   Bacteria, UA NONE SEEN NONE SEEN   Squamous Epithelial / LPF 0-5 0 - 5    Comment: Performed at [facility], [place] [address], [place], [place] [address]  Troponin I (High Sensitivity)     Status: Abnormal   Collection Time: 10/29/22  5:39 AM  Result Value Ref Range   Troponin I (High Sensitivity) 3,172 (HH) <18 ng/L    Comment: CRITICAL VALUE NOTED. VALUE IS CONSISTENT WITH PREVIOUSLY REPORTED/CALLED VALUE (NOTE) Elevated high sensitivity troponin I (hsTnI) values and significant  changes across serial measurements may suggest ACS but many other  chronic and acute conditions are known to elevate hsTnI results.  Refer to the "Links" section for chest pain algorithms and additional  guidance. Performed at [facility], [place] [address] [place], [place] [address]   Heparin level (unfractionated)     Status: Abnormal    Collection Time: 10/29/22  5:39 AM  Result Value Ref Range   Heparin Unfractionated >1.10 (H) 0.30 - 0.70 IU/mL    Comment: (NOTE) The clinical reportable range upper limit is being lowered to >1.10 to align with the FDA approved guidance for the current laboratory assay.  If heparin results are below expected values, and patient dosage has  been confirmed, suggest follow up testing of antithrombin III levels. Performed at [facility], [place] [address] [place], [place] [address]  Magnesium     Status: None   Collection Time: 10/29/22  5:39 AM  Result Value Ref Range   Magnesium 1.9 1.7 - 2.4 mg/dL    Comment: Performed at [facility] [address] [place] [address]  CBC     Status: Abnormal   Collection Time: 10/29/22  6:47 AM  Result Value Ref Range   WBC 7.4 4.0 - 10.5 K/uL   RBC 2.79 (L) 3.87 - 5.11 MIL/uL   Hemoglobin 7.7 (L) 12.0 - 15.0 g/dL   HCT 24.5 (L) 36.0 - 46.0 %   MCV 87.8 80.0 - 100.0 fL   MCH 27.6 26.0 - 34.0 pg   MCHC 31.4 30.0 - 36.0 g/dL   RDW 14.6 11.5 - 15.5 %   Platelets 169 150 - 400 K/uL   nRBC 0.0 0.0 - 0.2 %    Comment: Performed at [facility], [place] [address], [place], [place] [address]   MR BRAIN WO CONTRAST  Result Date: 10/29/2022 CLINICAL DATA:  Neuro deficit with acute stroke suspected. History of laryngectomy for cancer EXAM: MRI HEAD WITHOUT CONTRAST TECHNIQUE: Multiplanar, multiecho pulse sequences of the brain and surrounding structures were obtained without intravenous contrast. COMPARISON:  Head CT from earlier today FINDINGS: Brain: No acute infarction, hemorrhage, hydrocephalus, extra-axial collection or mass lesion. Vascular: Normal flow voids. Skull and upper cervical spine: No focal marrow lesion Sinuses/Orbits: Chronic right sphenoid sinusitis with partial opacification by CT. Other: Laryngectomy seen on sagittal T1 weighted images. IMPRESSION: No acute finding.  Negative for infarct. Electronically Signed    By: Jorje Guild M.D.   On: 10/29/2022 04:27   CT HEAD WO CONTRAST  Result Date: 10/28/2022 CLINICAL DATA:  Neuro deficit, acute stroke suspected. EXAM: CT HEAD WITHOUT CONTRAST TECHNIQUE: Contiguous axial images were obtained from the base of the skull through the vertex without intravenous contrast. RADIATION DOSE REDUCTION: This exam was performed according to the departmental dose-optimization program which includes automated exposure control, adjustment of the mA and/or kV according to patient size and/or use of iterative reconstruction technique. COMPARISON:  CT examination dated September 02, 2022. FINDINGS: Brain: No evidence of acute infarction, hemorrhage, hydrocephalus, extra-axial collection or mass lesion/mass effect. Vascular: No hyperdense vessel or unexpected calcification. Skull: Normal. Negative for fracture or focal lesion. Sinuses/Orbits: No acute finding. Other: None. IMPRESSION: No acute intracranial pathology. Electronically Signed   By: Keane Police D.O.   On: 10/28/2022 23:59   DG Chest Portable 1 View  Result Date: 10/28/2022 CLINICAL DATA:  Shortness of breath. EXAM: PORTABLE CHEST 1 VIEW COMPARISON:  Chest radiograph dated September 08, 2022. FINDINGS: The heart is mildly enlarged. Prominent atherosclerotic calcification of the aortic arch. Biapical pleural/parenchymal scarring. No focal consolidation or pleural effusion. IMPRESSION: Mild cardiomegaly. No focal consolidation or pleural effusion. Electronically Signed   By: Keane Police D.O.   On: 10/28/2022 22:30    Pending Labs Unresulted Labs (From admission, onward)     Start     Ordered   10/30/22 0500  Lipoprotein A (LPA)  Tomorrow morning,   R        10/29/22 0544   10/30/22 1700  Basic metabolic panel  Tomorrow morning,   R        10/29/22 0544   10/30/22 0500  Magnesium  Tomorrow morning,   R        10/29/22 0544   10/30/22 0500  CBC with Differential/Platelet  Tomorrow morning,   R  10/29/22 0544    10/29/22 9024  Basic metabolic panel  Once,   R        10/29/22 1252   10/29/22 0973  Basic metabolic panel  Once,   R        10/29/22 0710            Vitals/Pain Today's Vitals   10/29/22 1300 10/29/22 1445 10/29/22 1500 10/29/22 1516  BP: (!) 143/66 (!) 159/68 (!) 147/64   Pulse: 70 72 (!) 113   Resp: 18 15 (!) 21   Temp:    98.7 F (37.1 C)  TempSrc:    Oral  SpO2: 100% 94% 92%   Weight:      Height:      PainSc:        Isolation Precautions No active isolations  Medications Medications  aspirin EC tablet 81 mg (has no administration in time range)  nitroGLYCERIN (NITROSTAT) SL tablet 0.4 mg (0.4 mg Sublingual Given 10/29/22 1027)  acetaminophen (TYLENOL) tablet 650 mg (650 mg Oral Given 10/29/22 0819)  ondansetron (ZOFRAN) injection 4 mg (4 mg Intravenous Given 10/29/22 1025)  apixaban (ELIQUIS) tablet 2.5 mg (2.5 mg Oral Given 10/29/22 1016)  carvedilol (COREG) tablet 25 mg (25 mg Oral Given 10/29/22 0820)  cilostazol (PLETAL) tablet 100 mg (100 mg Oral Given 10/29/22 1017)  cloNIDine (CATAPRES - Dosed in mg/24 hr) patch 0.1 mg (has no administration in time range)  DULoxetine (CYMBALTA) DR capsule 20 mg (20 mg Oral Given 10/29/22 1017)  isosorbide mononitrate (IMDUR) 24 hr tablet 60 mg (60 mg Oral Given 10/29/22 1017)  levothyroxine (SYNTHROID) tablet 50 mcg (50 mcg Oral Given 10/29/22 0820)  LORazepam (ATIVAN) tablet 1 mg (has no administration in time range)  minoxidil (LONITEN) tablet 2.5 mg (2.5 mg Oral Given 10/29/22 1017)  pantoprazole (PROTONIX) EC tablet 40 mg (40 mg Oral Given 10/29/22 1017)  rosuvastatin (CRESTOR) tablet 20 mg (20 mg Oral Given 10/29/22 1017)  guaiFENesin (MUCINEX) 12 hr tablet 1,200 mg (1,200 mg Oral Given 10/29/22 1017)  ipratropium-albuterol (DUONEB) 0.5-2.5 (3) MG/3ML nebulizer solution 3 mL (3 mLs Nebulization Given 10/29/22 1519)  hydrALAZINE (APRESOLINE) tablet 125 mg (125 mg Oral Given 10/29/22 1519)  LORazepam (ATIVAN)  injection 1 mg (1 mg Intravenous Given 10/29/22 0250)  fentaNYL (SUBLIMAZE) injection 25 mcg (25 mcg Intravenous Given 10/29/22 0445)    Mobility walks Low fall risk   Focused Assessments Cardiac Assessment Handoff:  Cardiac Rhythm: Normal sinus rhythm Lab Results  Component Value Date   CKTOTAL 159 07/10/2010   CKMB 3.4 07/10/2010   TROPONINI <0.03 10/29/2015   Lab Results  Component Value Date   DDIMER >20.00 (H) 08/06/2022   Does the Patient currently have chest pain? No    R Recommendations: See Admitting Provider Note  Report given to:   Additional Notes:

## 2022-10-29 NOTE — H&P (Signed)
PCP:   Hoyt Koch, MD   Chief Complaint:  NSTEMI  HPI: This is a 76 year old female with past medical history of head and neck cancer, atrial fibrillation, hypertension.  COPD, CKD and hypothyroidism.  Patient unable to provide history.  Patient's son Frankey Poot contacted via phone, he states he already gave information to 3 or 4 physicians and they should have written notes.  I begun explaining the each physician had to take it their own history.  He then hung up the phone.  Review of Systems:  Unable to obtain  Past Medical History: Past Medical History:  Diagnosis Date   Anemia    Anxiety    takes Ativan prn   Blood transfusion without reported diagnosis 09/15/12   2 units Prbc's   Broken ribs    Chronic back pain    CKD (chronic kidney disease), stage IV (HCC)    Constipation    related to pain meds   COPD (chronic obstructive pulmonary disease) ([place]) 08/10/2012   denies   Depression    Gastrostomy in place [facility])    removed   GERD (gastroesophageal reflux disease)    takes Zantac daily   Headache(784.0)    Hiatal hernia 08/10/2012   History of radiation therapy 10/17/12-11/25/12   supraglottic larynx,high risk neck tumor bed 5880 cGy/28 sessions, high risk lymph node tumor bed 5600 cGy/20 sessions, mod risk lymph node tumor bed 5040 cGy/20 sessions   Hypercholesteremia    takes Pravastatin daily   Hypertension    takes Tribenzor and Atenolol daily   Insomnia    takes Amitriptyline daily   Nausea    takes Zofran prn   PAD (peripheral artery disease) ([place])    noninvasive imaging in 2016   Pneumonia    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 08/08/2012   Shortness of breath dyspnea    Stroke [facility]) 2011   denies. no residual   Uterine cancer ([place])    Past Surgical History:  Procedure Laterality Date   ABDOMINAL SURGERY     r/t uterine carcinoma   APPENDECTOMY     DIRECT LARYNGOSCOPY N/A 05/22/2014   Procedure: DIRECT LARYNGOSCOPY WITH ESOPHAGEAL  DILATION;  Surgeon: Jerrell Belfast, MD;  Location: [facility] OR;  Service: ENT;  Laterality: N/A;   ESOPHAGOSCOPY WITH DILITATION N/A 05/29/2015   Procedure: ESOPHAGOSCOPY WITH ESOPHAGEAL DILITATION;  Surgeon: Jerrell Belfast, MD;  Location: [place];  Service: ENT;  Laterality: N/A;   Gastrostomy Tube removed   2013   GASTROSTOMY W/ FEEDING TUBE  13   HERNIA REPAIR     child   LARYNGETOMY  08/31/2012   Procedure: LARYNGECTOMY;  Surgeon: Jerrell Belfast, MD;  Location: [place];  Service: ENT;  Laterality: N/A;   LARYNGOSCOPY  08/10/2012   Procedure: LARYNGOSCOPY;  Surgeon: Jerrell Belfast, MD;  Location: [facility];  Service: ENT;  Laterality: N/A;  with biopsy   RADICAL NECK DISSECTION  08/31/2012   Procedure: RADICAL NECK DISSECTION;  Surgeon: Jerrell Belfast, MD;  Location: [place];  Service: ENT;  Laterality: Bilateral;   TRACHEAL ESOPHOGEAL PUNCTURE WITH REPAIR STOMA N/A 09/08/2013   Procedure: TRACHEAL ESOPHOGEAL PUNCTURE WITH PLACEMENT OF  PROVOX PROSTHESIS ;  Surgeon: Jerrell Belfast, MD;  Location: [place];  Service: ENT;  Laterality: N/A;   TRACHEOSTOMY TUBE PLACEMENT  08/10/2012   Procedure: TRACHEOSTOMY;  Surgeon: Jerrell Belfast, MD;  Location: [facility];  Service: ENT;  Laterality: N/A;    Medications: Prior to Admission medications   Medication Sig Start  Date End Date Taking? Authorizing Provider  acetaminophen (TYLENOL) 500 MG tablet Take 1,000 mg by mouth as needed for headache.    [provider]  apixaban (ELIQUIS) 2.5 MG TABS tablet Take 1 tablet (2.5 mg total) by mouth 2 (two) times daily. 02/10/22   Hoyt Koch, MD  carvedilol (COREG) 25 MG tablet TAKE 1 TABLET (25 MG TOTAL) BY MOUTH TWICE A DAY WITH MEALS Patient taking differently: Take 25 mg by mouth 2 (two) times daily with a meal. 05/26/22   Hoyt Koch, MD  cilostazol (PLETAL) 100 MG tablet TAKE 1 TABLET BY MOUTH TWICE A DAY Patient taking differently: Take 100 mg by mouth 2 (two) times daily. 02/03/22   Hoyt Koch, MD  cloNIDine (CATAPRES - DOSED IN MG/24 HR) 0.1 mg/24hr patch Place 1 patch (0.1 mg total) onto the skin once a week. 09/18/22   Barb Merino, MD  cloNIDine (CATAPRES) 0.1 MG tablet Take 1 tablet (0.1 mg total) by mouth 4 (four) times daily as needed (for SBP > 180 or DBP > 100). 09/17/22   Barb Merino, MD  diphenoxylate-atropine (LOMOTIL) 2.5-0.025 MG tablet Take 1 tablet by mouth daily as needed for diarrhea or loose stools. 10/23/22   Hoyt Koch, MD  DULoxetine (CYMBALTA) 20 MG capsule TAKE 1 CAPSULE BY MOUTH EVERY DAY Patient taking differently: Take 20 mg by mouth daily. 07/23/22   Hoyt Koch, MD  Ensure (ENSURE) Take 237 mLs by mouth daily.    [provider]  fluticasone (FLONASE) 50 MCG/ACT nasal spray SPRAY 2 SPRAYS INTO EACH NOSTRIL EVERY DAY 01/31/22   Janith Lima, MD  hydrALAZINE (APRESOLINE) 100 MG tablet Take 1 tablet (100 mg total) by mouth 3 (three) times daily. 03/03/22   Hoyt Koch, MD  isosorbide mononitrate (IMDUR) 60 MG 24 hr tablet Take 1 tablet (60 mg total) by mouth 2 (two) times daily. 09/17/22 10/17/22  Barb Merino, MD  levothyroxine (SYNTHROID) 50 MCG tablet Take 1 tablet (50 mcg total) by mouth daily before breakfast. Additional refill from pcp 10/20/22   Nche, Charlene Brooke, NP  loperamide (IMODIUM A-D) 2 MG tablet Take 2 mg by mouth as needed for diarrhea or loose stools.    [provider]  LORazepam (ATIVAN) 1 MG tablet Take 1 tablet (1 mg total) by mouth 2 (two) times daily as needed for anxiety. 08/13/22   Hoyt Koch, MD  minoxidil (LONITEN) 2.5 MG tablet Take 1 tablet (2.5 mg total) by mouth 2 (two) times daily. 10/23/22   Hoyt Koch, MD  mirtazapine (REMERON) 15 MG tablet TAKE 1 TABLET BY MOUTH EVERYDAY AT BEDTIME 09/18/22   Hoyt Koch, MD  moxifloxacin (VIGAMOX) 0.5 % ophthalmic solution Place 1 drop into the left eye 3 (three) times daily. 10/20/22   Nche, Charlene Brooke, NP  ondansetron (ZOFRAN) 4 MG tablet TAKE 1 TABLET BY MOUTH EVERY 8 HOURS AS NEEDED FOR NAUSEA AND VOMITING Patient taking differently: Take 4 mg by mouth as needed for nausea or vomiting. 02/03/22   Hoyt Koch, MD  pantoprazole (PROTONIX) 40 MG tablet Take 1 tablet (40 mg total) by mouth daily. 10/23/22   Hoyt Koch, MD  rosuvastatin (CRESTOR) 20 MG tablet TAKE 1 TABLET (20 MG TOTAL) BY MOUTH DAILY. Patient taking differently: Take 20 mg by mouth at bedtime. 07/29/22   Hoyt Koch, MD  sodium bicarbonate 650 MG tablet Take 650 mg by mouth 3 (three) times daily. 10/01/22  [provider]  levocetirizine (XYZAL) 5 MG tablet TAKE 1 TABLET(5 MG) BY MOUTH EVERY EVENING 02/21/20 06/10/20  Janith Lima, MD    Allergies:   Allergies  Allergen Reactions   Xyzal [Levocetirizine Dihydrochloride] Itching   Augmentin [Amoxicillin-Pot Clavulanate] Diarrhea and Nausea And Vomiting   Tribenzor [Olmesartan-Amlodipine-Hctz] Other (See Comments)    "Hurt the kidneys"    Social History:  reports that she quit smoking about 9 years ago. Her smoking use included cigarettes. She has a 12.50 pack-year smoking history. She has never used smokeless tobacco. She reports current alcohol use. She reports that she does not use drugs.  Family History: Family History  Problem Relation Age of Onset   Throat cancer Father    Brain cancer Brother    CAD Neg Hx     Physical Exam: Vitals:   10/29/22 0330 10/29/22 0348 10/29/22 0349 10/29/22 0440  BP: (!) 153/71   (!) 154/74  Pulse:  74  68  Resp: (!) 9 (!) 27  17  Temp:      TempSrc:      SpO2:  92% 96% 92%  Weight:      Height:        General:  Alert and oriented times three, well developed and nourished, no acute distress Eyes: PERRLA, pink conjunctiva, no scleral icterus ENT: Moist oral mucosa, neck supple. Trach in place Lungs: clear to ascultation, no wheeze, no crackles, no use of accessory  muscles Cardiovascular: regular rate and rhythm, no regurgitation, no gallops, no murmurs. No carotid bruits, no JVD Abdomen: soft, positive BS, non-tender, non-distended, no organomegaly, not an acute abdomen GU: not examined Neuro: CN II - XII grossly intact, sensation intact Musculoskeletal: strength 5/5 all extremities, no clubbing, cyanosis or edema Skin: no rash, no subcutaneous crepitation, no decubitus Psych: appropriate patient   Labs on Admission:  Recent Labs    10/28/22 2212  NA 140  K 4.3  CL 106  CO2 20*  GLUCOSE 110*  BUN 58*  CREATININE 3.99*  CALCIUM 9.3   Recent Labs    10/28/22 2212  AST 13*  ALT 12  ALKPHOS 78  BILITOT 0.6  PROT 6.8  ALBUMIN 3.3*   No results for input(s): "LIPASE", "AMYLASE" in the last 72 hours. Recent Labs    10/28/22 2212  WBC 7.9  NEUTROABS 5.8  HGB 8.4*  HCT 26.0*  MCV 86.1  PLT 186    Micro Results: Recent Results (from the past 240 hour(s))  Resp Panel by RT-PCR (Flu A&B, Covid) Anterior Nasal Swab     Status: None   Collection Time: 10/28/22 11:19 PM   Specimen: Anterior Nasal Swab  Result Value Ref Range Status   SARS Coronavirus 2 by RT PCR NEGATIVE NEGATIVE Final    Comment: (NOTE) SARS-CoV-2 target nucleic acids are NOT DETECTED.  The SARS-CoV-2 RNA is generally detectable in upper respiratory specimens during the acute phase of infection. The lowest concentration of SARS-CoV-2 viral copies this assay can detect is 138 copies/mL. A negative result does not preclude SARS-Cov-2 infection and should not be used as the sole basis for treatment or other patient management decisions. A negative result may occur with  improper specimen collection/handling, submission of specimen other than nasopharyngeal swab, presence of viral mutation(s) within the areas targeted by this assay, and inadequate number of viral copies(<138 copies/mL). A negative result must be combined with clinical observations, patient  history, and epidemiological information. The expected result is Negative.  Fact Sheet  for Patients:  EntrepreneurPulse.com.au  Fact Sheet for Healthcare Providers:  IncredibleEmployment.be  This test is no t yet approved or cleared by the [place] FDA and  has been authorized for detection and/or diagnosis of SARS-CoV-2 by FDA under an Emergency Use Authorization (EUA). This EUA will remain  in effect (meaning this test can be used) for the duration of the COVID-19 declaration under Section 564(b)(1) of the Act, 21 U.S.C.section 360bbb-3(b)(1), unless the authorization is terminated  or revoked sooner.       Influenza A by PCR NEGATIVE NEGATIVE Final   Influenza B by PCR NEGATIVE NEGATIVE Final    Comment: (NOTE) The Xpert Xpress SARS-CoV-2/FLU/RSV plus assay is intended as an aid in the diagnosis of influenza from Nasopharyngeal swab specimens and should not be used as a sole basis for treatment. Nasal washings and aspirates are unacceptable for Xpert Xpress SARS-CoV-2/FLU/RSV testing.  Fact Sheet for Patients: EntrepreneurPulse.com.au  Fact Sheet for Healthcare Providers: IncredibleEmployment.be  This test is not yet approved or cleared by the [place] FDA and has been authorized for detection and/or diagnosis of SARS-CoV-2 by FDA under an Emergency Use Authorization (EUA). This EUA will remain in effect (meaning this test can be used) for the duration of the COVID-19 declaration under Section 564(b)(1) of the Act, 21 U.S.C. section 360bbb-3(b)(1), unless the authorization is terminated or revoked.  Performed at [facility], [place] [address] [place], [place] [address]      Radiological Exams on Admission: MR BRAIN WO CONTRAST  Result Date: 10/29/2022 CLINICAL DATA:  Neuro deficit with acute stroke suspected. History of laryngectomy for cancer EXAM: MRI HEAD WITHOUT CONTRAST  TECHNIQUE: Multiplanar, multiecho pulse sequences of the brain and surrounding structures were obtained without intravenous contrast. COMPARISON:  Head CT from earlier today FINDINGS: Brain: No acute infarction, hemorrhage, hydrocephalus, extra-axial collection or mass lesion. Vascular: Normal flow voids. Skull and upper cervical spine: No focal marrow lesion Sinuses/Orbits: Chronic right sphenoid sinusitis with partial opacification by CT. Other: Laryngectomy seen on sagittal T1 weighted images. IMPRESSION: No acute finding.  Negative for infarct. Electronically Signed   By: Jorje Guild M.D.   On: 10/29/2022 04:27   CT HEAD WO CONTRAST  Result Date: 10/28/2022 CLINICAL DATA:  Neuro deficit, acute stroke suspected. EXAM: CT HEAD WITHOUT CONTRAST TECHNIQUE: Contiguous axial images were obtained from the base of the skull through the vertex without intravenous contrast. RADIATION DOSE REDUCTION: This exam was performed according to the departmental dose-optimization program which includes automated exposure control, adjustment of the mA and/or kV according to patient size and/or use of iterative reconstruction technique. COMPARISON:  CT examination dated September 02, 2022. FINDINGS: Brain: No evidence of acute infarction, hemorrhage, hydrocephalus, extra-axial collection or mass lesion/mass effect. Vascular: No hyperdense vessel or unexpected calcification. Skull: Normal. Negative for fracture or focal lesion. Sinuses/Orbits: No acute finding. Other: None. IMPRESSION: No acute intracranial pathology. Electronically Signed   By: Keane Police D.O.   On: 10/28/2022 23:59   DG Chest Portable 1 View  Result Date: 10/28/2022 CLINICAL DATA:  Shortness of breath. EXAM: PORTABLE CHEST 1 VIEW COMPARISON:  Chest radiograph dated September 08, 2022. FINDINGS: The heart is mildly enlarged. Prominent atherosclerotic calcification of the aortic arch. Biapical pleural/parenchymal scarring. No focal consolidation or  pleural effusion. IMPRESSION: Mild cardiomegaly. No focal consolidation or pleural effusion. Electronically Signed   By: Keane Police D.O.   On: 10/28/2022 22:30    Assessment/Plan Present on Admission:  NSTEMI (non-ST elevated myocardial infarction) (  [place]) -Admit to progressive care -NSTEMI order set initiated -Cardiology on board has seen patient -Resume Eliquis, aspirin statin and home meds -Further work-up per cardiology -2D echo ordered   PAF (paroxysmal atrial fibrillation)-CHADS2 Vas score of 5 -Metoprolol and Eliquis resumed  Question CVA -Patient seen by neurology -Unlikely CVA.  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   Hypertension -Clonidine, minoxidil, hydralazine, Coreg resumed  Hypothyroidism -Synthroid resumed   HLD (hyperlipidemia) -Crestor resumed  CKD stage IV -Avoid nephrotoxic medication -Sodium bicarb resumed   Moderate protein-calorie malnutrition ([place])    Donavon Kimrey 10/29/2022, 5:21 AM

## 2022-10-29 NOTE — ED Notes (Signed)
Daughter-in-law wants to be updated when patient goes to admission room, if anything changes, or needs anything such as translation due to low tone voice. She will be returning to work.

## 2022-10-29 NOTE — Progress Notes (Signed)
  Echocardiogram 2D Echocardiogram has been performed.  [name] 10/29/2022, 3:06 PM

## 2022-10-29 NOTE — ED Notes (Signed)
PA Sponsellar made aware of Troponin level

## 2022-10-29 NOTE — Consult Note (Addendum)
Cardiology Consultation   Patient ID: [name] MRN: [id number]; DOB: [birth year]  Admit date: 10/28/2022 Date of Consult: 10/29/2022  PCP:  Hoyt Koch, MD   [place] Prov[facility] Cardiologist:  Mertie Moores, MD        Patient Profile:   [name] is a 76 y.o. female with a hx of atrial fibrillation, hypertension, anxiety, depression, hyperlipidemia, PAD, COPD, chronic pain, dysphagia, CKD 4, GERD, hypothyroidism, history of head neck cancer status post laryngectomy, anemia, CVA who is being seen 10/29/2022 for the evaluation of chest pain and elevated troponin at the request of PA [place].  History of Present Illness:   [name] is accompanied by her son today who provides most of the history.  He reports that the patient was in her usual state of health yesterday but today noticed that she was quite weak, not her usual self, and complaining of substernal chest pain that has been present intermittently in the past. She also had an acute episode of dyspnea without an inciting event in which she looked acutely uncomfortable and was breathing rapidly. This improved with sitting and resting.  Given this constellation of symptoms he brought her to the emergency department for evaluation.  In the ED, her workup was notable for troponin of 1763 with repeat pending. BNP elevated to 559 which is lower than her previous admission for HFpEF (1263) in September but higher than her baseline around 100 during previous visits. CT brain showed no acute abnormality.  She has had elevations of troponin multiple times in the past few months with levels in the 1000-2000 range. It does not appear that cardiology was consulted during those admissions and no coronary assessment was completed likely due to her advanced renal disease and concern for progression to ESRD. Echocardiograms during these admissions show preserved function.  She was admitted 09/02/22 -  09/17/22 for acute on chronic HFpEF and required IV diuresis. Her course was complicated by very labile blood pressures and she was ultimately discharged on carvedilol, clonidine, hydralazine, isosorbide mononitrate, and minoxidil.   Past Medical History:  Diagnosis Date   Anemia    Anxiety    takes Ativan prn   Blood transfusion without reported diagnosis 09/15/12   2 units Prbc's   Broken ribs    Chronic back pain    CKD (chronic kidney disease), stage IV (HCC)    Constipation    related to pain meds   COPD (chronic obstructive pulmonary disease) ([place]) 08/10/2012   denies   Depression    Gastrostomy in place [facility])    removed   GERD (gastroesophageal reflux disease)    takes Zantac daily   Headache(784.0)    Hiatal hernia 08/10/2012   History of radiation therapy 10/17/12-11/25/12   supraglottic larynx,high risk neck tumor bed 5880 cGy/28 sessions, high risk lymph node tumor bed 5600 cGy/20 sessions, mod risk lymph node tumor bed 5040 cGy/20 sessions   Hypercholesteremia    takes Pravastatin daily   Hypertension    takes Tribenzor and Atenolol daily   Insomnia    takes Amitriptyline daily   Nausea    takes Zofran prn   PAD (peripheral artery disease) ([place])    noninvasive imaging in 2016   Pneumonia    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 08/08/2012   Shortness of breath dyspnea    Stroke [facility]) 2011   denies. no residual   Uterine cancer [facility])     Past Surgical History:  Procedure  Laterality Date   ABDOMINAL SURGERY     r/t uterine carcinoma   APPENDECTOMY     DIRECT LARYNGOSCOPY N/A 05/22/2014   Procedure: DIRECT LARYNGOSCOPY WITH ESOPHAGEAL DILATION;  Surgeon: Jerrell Belfast, MD;  Location: [place];  Service: ENT;  Laterality: N/A;   ESOPHAGOSCOPY WITH DILITATION N/A 05/29/2015   Procedure: ESOPHAGOSCOPY WITH ESOPHAGEAL DILITATION;  Surgeon: Jerrell Belfast, MD;  Location: [place];  Service: ENT;  Laterality: N/A;   Gastrostomy Tube removed   2013    GASTROSTOMY W/ FEEDING TUBE  12   HERNIA REPAIR     child   LARYNGETOMY  08/31/2012   Procedure: LARYNGECTOMY;  Surgeon: Jerrell Belfast, MD;  Location: [place];  Service: ENT;  Laterality: N/A;   LARYNGOSCOPY  08/10/2012   Procedure: LARYNGOSCOPY;  Surgeon: Jerrell Belfast, MD;  Location: [facility];  Service: ENT;  Laterality: N/A;  with biopsy   RADICAL NECK DISSECTION  08/31/2012   Procedure: RADICAL NECK DISSECTION;  Surgeon: Jerrell Belfast, MD;  Location: [place];  Service: ENT;  Laterality: Bilateral;   TRACHEAL ESOPHOGEAL PUNCTURE WITH REPAIR STOMA N/A 09/08/2013   Procedure: TRACHEAL ESOPHOGEAL PUNCTURE WITH PLACEMENT OF  PROVOX PROSTHESIS ;  Surgeon: Jerrell Belfast, MD;  Location: [place];  Service: ENT;  Laterality: N/A;   TRACHEOSTOMY TUBE PLACEMENT  08/10/2012   Procedure: TRACHEOSTOMY;  Surgeon: Jerrell Belfast, MD;  Location: [facility];  Service: ENT;  Laterality: N/A;     Home Medications:  Prior to Admission medications   Medication Sig Start Date End Date Taking? Authorizing Provider  acetaminophen (TYLENOL) 500 MG tablet Take 1,000 mg by mouth as needed for headache.    [provider]  apixaban (ELIQUIS) 2.5 MG TABS tablet Take 1 tablet (2.5 mg total) by mouth 2 (two) times daily. 02/10/22   Hoyt Koch, MD  carvedilol (COREG) 25 MG tablet TAKE 1 TABLET (25 MG TOTAL) BY MOUTH TWICE A DAY WITH MEALS Patient taking differently: Take 25 mg by mouth 2 (two) times daily with a meal. 05/26/22   Hoyt Koch, MD  cilostazol (PLETAL) 100 MG tablet TAKE 1 TABLET BY MOUTH TWICE A DAY Patient taking differently: Take 100 mg by mouth 2 (two) times daily. 02/03/22   Hoyt Koch, MD  cloNIDine (CATAPRES - DOSED IN MG/24 HR) 0.1 mg/24hr patch Place 1 patch (0.1 mg total) onto the skin once a week. 09/18/22   Barb Merino, MD  cloNIDine (CATAPRES) 0.1 MG tablet Take 1 tablet (0.1 mg total) by mouth 4 (four) times daily as needed (for SBP > 180 or DBP > 100). 09/17/22    Barb Merino, MD  diphenoxylate-atropine (LOMOTIL) 2.5-0.025 MG tablet Take 1 tablet by mouth daily as needed for diarrhea or loose stools. 10/23/22   Hoyt Koch, MD  DULoxetine (CYMBALTA) 20 MG capsule TAKE 1 CAPSULE BY MOUTH EVERY DAY Patient taking differently: Take 20 mg by mouth daily. 07/23/22   Hoyt Koch, MD  Ensure (ENSURE) Take 237 mLs by mouth daily.    [provider]  fluticasone (FLONASE) 50 MCG/ACT nasal spray SPRAY 2 SPRAYS INTO EACH NOSTRIL EVERY DAY 01/31/22   Janith Lima, MD  hydrALAZINE (APRESOLINE) 100 MG tablet Take 1 tablet (100 mg total) by mouth 3 (three) times daily. 03/03/22   Hoyt Koch, MD  isosorbide mononitrate (IMDUR) 60 MG 24 hr tablet Take 1 tablet (60 mg total) by mouth 2 (two) times daily. 09/17/22 10/17/22  Barb Merino, MD  levothyroxine (SYNTHROID) 50  MCG tablet Take 1 tablet (50 mcg total) by mouth daily before breakfast. Additional refill from pcp 10/20/22   Nche, Charlene Brooke, NP  loperamide (IMODIUM A-D) 2 MG tablet Take 2 mg by mouth as needed for diarrhea or loose stools.    [provider]  LORazepam (ATIVAN) 1 MG tablet Take 1 tablet (1 mg total) by mouth 2 (two) times daily as needed for anxiety. 08/13/22   Hoyt Koch, MD  minoxidil (LONITEN) 2.5 MG tablet Take 1 tablet (2.5 mg total) by mouth 2 (two) times daily. 10/23/22   Hoyt Koch, MD  mirtazapine (REMERON) 15 MG tablet TAKE 1 TABLET BY MOUTH EVERYDAY AT BEDTIME 09/18/22   Hoyt Koch, MD  moxifloxacin (VIGAMOX) 0.5 % ophthalmic solution Place 1 drop into the left eye 3 (three) times daily. 10/20/22   Nche, Charlene Brooke, NP  ondansetron (ZOFRAN) 4 MG tablet TAKE 1 TABLET BY MOUTH EVERY 8 HOURS AS NEEDED FOR NAUSEA AND VOMITING Patient taking differently: Take 4 mg by mouth as needed for nausea or vomiting. 02/03/22   Hoyt Koch, MD  pantoprazole (PROTONIX) 40 MG tablet Take 1 tablet (40 mg total) by mouth  daily. 10/23/22   Hoyt Koch, MD  rosuvastatin (CRESTOR) 20 MG tablet TAKE 1 TABLET (20 MG TOTAL) BY MOUTH DAILY. Patient taking differently: Take 20 mg by mouth at bedtime. 07/29/22   Hoyt Koch, MD  sodium bicarbonate 650 MG tablet Take 650 mg by mouth 3 (three) times daily. 10/01/22   [provider]  levocetirizine (XYZAL) 5 MG tablet TAKE 1 TABLET(5 MG) BY MOUTH EVERY EVENING 02/21/20 06/10/20  Janith Lima, MD    Inpatient Medications: Scheduled Meds:  Continuous Infusions:  PRN Meds: LORazepam  Allergies:    Allergies  Allergen Reactions   Xyzal [Levocetirizine Dihydrochloride] Itching   Augmentin [Amoxicillin-Pot Clavulanate] Diarrhea and Nausea And Vomiting   Tribenzor [Olmesartan-Amlodipine-Hctz] Other (See Comments)    "Hurt the kidneys"    Social History:   Social History   Socioeconomic History   Marital status: Widowed    Spouse name: Not on file   Number of children: 4   Years of education: Not on file   Highest education level: Not on file  Occupational History    Comment: retired Regulatory affairs officer  Tobacco Use   Smoking status: Former    Packs/day: 0.25    Years: 50.00    Total pack years: 12.50    Types: Cigarettes    Quit date: 05/27/2013    Years since quitting: 9.4   Smokeless tobacco: Never  Vaping Use   Vaping Use: Never used  Substance and Sexual Activity   Alcohol use: Yes   Drug use: No   Sexual activity: Not Currently  Other Topics Concern   Not on file  Social History Narrative   ** Merged History Encounter **       Social Determinants of Health   Financial Resource Strain: Low Risk  (10/23/2022)   Overall Financial Resource Strain (CARDIA)    Difficulty of Paying Living Expenses: Not hard at all  Food Insecurity: No Food Insecurity (10/23/2022)   Hunger Vital Sign    Worried About Running Out of Food in the Last Year: Never true    Ran Out of Food in the Last Year: Never true  Transportation  Needs: No Transportation Needs (10/23/2022)   PRAPARE - Hydrologist (Medical): No    Lack of Transportation (  Non-Medical): No  Physical Activity: Inactive (10/23/2022)   Exercise Vital Sign    Days of Exercise per Week: 0 days    Minutes of Exercise per Session: 0 min  Stress: No Stress Concern Present (10/23/2022)   [place]    Feeling of Stress : Not at all  Social Connections: Socially Isolated (10/23/2022)   Social Connection and Isolation Panel [NHANES]    Frequency of Communication with Friends and Family: Once a week    Frequency of Social Gatherings with Friends and Family: Once a week    Attends Religious Services: More than 4 times per year    Active Member of [employer] or Organizations: No    Attends Archivist Meetings: Never    Marital Status: Widowed  Intimate Partner Violence: Unknown (10/23/2022)   Humiliation, Afraid, Rape, and Kick questionnaire    Fear of Current or Ex-Partner: Patient refused    Emotionally Abused: Patient refused    Physically Abused: Patient refused    Sexually Abused: Patient refused    Family History:    Family History  Problem Relation Age of Onset   Throat cancer Father    Brain cancer Brother    CAD Neg Hx      ROS:  Please see the history of present illness.   All other ROS reviewed and negative.     Physical Exam/Data:   Vitals:   10/28/22 2153 10/28/22 2215 10/28/22 2300 10/28/22 2318  BP:  (!) 174/79 (!) 165/75   Pulse:  69 69   Resp:  17 20   Temp: 98.9 F (37.2 C)     TempSrc: Oral     SpO2:  97% 98%   Weight:    49.9 kg  Height:    '5\' 1"'$  (1.549 m)   No intake or output data in the 24 hours ending 10/29/22 0027    10/28/2022   11:18 PM 10/23/2022    9:45 AM 10/23/2022    9:08 AM  Last 3 Weights  Weight (lbs) 110 lb 121 lb 121 lb  Weight (kg) 49.896 kg 54.885 kg 54.885 kg     Body mass index is 20.78  kg/m.  General:  elderly female in no acute distress HEENT: normal Neck: JVD mildly elevated, tracheostomy in place Vascular: No carotid bruits; Distal pulses 2+ bilaterally Cardiac:  normal S1, S2; RRR; no murmur  Lungs:  clear to auscultation bilaterally, no wheezing, rhonchi or rales  Abd: soft, nontender, no hepatomegaly  Ext: no edema Musculoskeletal:  No deformities, BUE and BLE strength normal and equal Skin: warm and dry  Neuro:  CNs 2-12 intact, no focal abnormalities noted Psych:  Normal affect   EKG:  The EKG was personally reviewed and demonstrates:  Normal sinus rhythm Telemetry:  Telemetry was personally reviewed and demonstrates:  none  Relevant CV Studies: 1. Left ventricular ejection fraction, by estimation, is >75%. The left  ventricle has hyperdynamic function. The left ventricle has no regional  wall motion abnormalities. There is moderate concentric left ventricular  hypertrophy. Left ventricular  diastolic parameters are consistent with Grade I diastolic dysfunction  (impaired relaxation).   2. Right ventricular systolic function is normal. The right ventricular  size is normal. There is normal pulmonary artery systolic pressure.   3. Left atrial size was moderately dilated.   4. The mitral valve is normal in structure. No evidence of mitral valve  regurgitation. No evidence of mitral stenosis.  5. The aortic valve is tricuspid. Aortic valve regurgitation is not  visualized. No aortic stenosis is present.   6. The inferior vena cava is normal in size with <50% respiratory  variability, suggesting right atrial pressure of 8 mmHg.   Laboratory Data:  High Sensitivity Troponin:   Recent Labs  Lab 10/28/22 2212  TROPONINIHS 1,763*     Chemistry Recent Labs  Lab 10/28/22 2212  NA 140  K 4.3  CL 106  CO2 20*  GLUCOSE 110*  BUN 58*  CREATININE 3.99*  CALCIUM 9.3  GFRNONAA 11*  ANIONGAP 14    Recent Labs  Lab 10/28/22 2212  PROT 6.8   ALBUMIN 3.3*  AST 13*  ALT 12  ALKPHOS 78  BILITOT 0.6   Lipids No results for input(s): "CHOL", "TRIG", "HDL", "LABVLDL", "LDLCALC", "CHOLHDL" in the last 168 hours.  Hematology Recent Labs  Lab 10/28/22 2212  WBC 7.9  RBC 3.02*  HGB 8.4*  HCT 26.0*  MCV 86.1  MCH 27.8  MCHC 32.3  RDW 14.7  PLT 186   Thyroid No results for input(s): "TSH", "FREET4" in the last 168 hours.  BNP Recent Labs  Lab 10/28/22 2212  BNP 559.7*    DDimer No results for input(s): "DDIMER" in the last 168 hours.   Radiology/Studies:  CT HEAD WO CONTRAST  Result Date: 10/28/2022 CLINICAL DATA:  Neuro deficit, acute stroke suspected. EXAM: CT HEAD WITHOUT CONTRAST TECHNIQUE: Contiguous axial images were obtained from the base of the skull through the vertex without intravenous contrast. RADIATION DOSE REDUCTION: This exam was performed according to the departmental dose-optimization program which includes automated exposure control, adjustment of the mA and/or kV according to patient size and/or use of iterative reconstruction technique. COMPARISON:  CT examination dated September 02, 2022. FINDINGS: Brain: No evidence of acute infarction, hemorrhage, hydrocephalus, extra-axial collection or mass lesion/mass effect. Vascular: No hyperdense vessel or unexpected calcification. Skull: Normal. Negative for fracture or focal lesion. Sinuses/Orbits: No acute finding. Other: None. IMPRESSION: No acute intracranial pathology. Electronically Signed   By: Keane Police D.O.   On: 10/28/2022 23:59   DG Chest Portable 1 View  Result Date: 10/28/2022 CLINICAL DATA:  Shortness of breath. EXAM: PORTABLE CHEST 1 VIEW COMPARISON:  Chest radiograph dated September 08, 2022. FINDINGS: The heart is mildly enlarged. Prominent atherosclerotic calcification of the aortic arch. Biapical pleural/parenchymal scarring. No focal consolidation or pleural effusion. IMPRESSION: Mild cardiomegaly. No focal consolidation or pleural  effusion. Electronically Signed   By: Keane Police D.O.   On: 10/28/2022 22:30     Assessment and Plan:   Elevated troponin Chronic HFpEF Paroxysmal atrial fibrillation Peripheral arterial disease Hypertension Hyperlipidemia CKD5 Hx of CVA  [name] is a 76 yr old female with HFpEF, CKD 5, PAD, A fib who presents with chest discomfort and dyspnea. Labs notable for dynamic rise in troponin with chest pain without ST elevations consistent with NSTEMI type I.  Recommend:  - heparin gtt for ACS, hold DOAC - ASA 81 mg daily - if persistent chest pain please start nitro gtt and titrate up until chest pain free - risk/benefit discussion with family in the morning regarding cath and the risk of progression to ESRD - continue home blood pressure regimen - continue statin therapy  Risk Assessment/Risk Scores:     TIMI Risk Score for Unstable Angina or Non-ST Elevation MI:   The patient's TIMI risk score is 4, which indicates a 20% risk of all cause mortality, new or recurrent  myocardial infarction or need for urgent revascularization in the next 14 days.    CHA2DS2-VASc Score = 8   This indicates a 10.8% annual risk of stroke. The patient's score is based upon: CHF History: 1 HTN History: 1 Diabetes History: 0 Stroke History: 2 Vascular Disease History: 1 Age Score: 2 Gender Score: 1     For questions or updates, please contact [place] Please consult [email or web address] for contact info under    Signed, Loren Racer, MD  10/29/2022 12:27 AM

## 2022-10-29 NOTE — ED Notes (Signed)
Patient ambulated to bathroom, now having some chest pain 5/10

## 2022-10-29 NOTE — ED Notes (Signed)
Pt c/o nausea.  

## 2022-10-29 NOTE — Consult Note (Addendum)
Consultation Note Date: 10/29/2022   Patient Name: [name]  DOB: [birth year]  MRN: [id number]  Age / Sex: 76 y.o., female  PCP: Ann Koch, MD Referring Physician: Elmarie Shiley, MD  Reason for Consultation: Establishing goals of care  HPI/Patient Profile: 76 y.o. female  with past medical history of head and neck cancer s/p laryngectomy, atrial fibrillation, hypertension, COPD, CKD and hypothyroidism presented to ED on 10/28/22 from home with chest pain and shortness of breath. Patient was admitted on 10/28/2022 with NSTEMI, PAF, dysphagia, moderate protein calorie malnutrition, ESRD not wanting dialysis.   Clinical Assessment and Goals of Care: I have reviewed medical records including EPIC notes, labs, and imaging. Received report from primary RN - no acute concerns.   Utilized ipad Spanish interpreter for visit: [name] # [phone]  Went to visit patient at bedside - no family/visitors present. Patient was lying in bed awake, alert, oriented, and able to participate in conversation. She is hard to understand due to trach. No signs or non-verbal gestures of pain or discomfort noted. No respiratory distress, increased work of breathing, or secretions noted. Patient denies pain or shortness of breath.   I introduced Palliative Medicine as specialized medical care for people living with serious illness. It focuses on providing relief from the symptoms and stress of a serious illness. The goal is to improve quality of life for both the patient and the family.  Offered [place] meeting with patient alone or with her son - patient would prefer to meet for full GOC with her son present. She is agreeable for PMT to schedule a meeting with him.  If patient is not able to make medical decisions for herself, she would want her son/[name] to be surrogate decision maker.  Patient requested limon lime shasta -  provided.   Patient was appreciative for visit today.  All questions and concerns addressed. Encouraged to call with questions and/or concerns. PMT card provided.  3:12 PM Attempted to call son/[name] to schedule GOC meeting  - no answer - confidential voicemail left and PMT phone number provided with request to return call.   Primary Decision Maker: PATIENT    SUMMARY OF RECOMMENDATIONS   Continue current supportive treatment Continue DNR/DNI as previously documented Patient prefers to have full [place] with son present. Unable to speak with son today - VM left with request for return call to schedule meeting If patient is unable to make medical decisions for herself, she wants her son/[name] to be surrogate decision maker  PMT will continue to follow and support holistically  **ADDENDUM** Family meeting scheduled for tomorrow 11/17 at [place]: DNR  Palliative Prophylaxis:  Aspiration, Bowel Regimen, Eye Care, Frequent Pain Assessment, and Turn Reposition  Additional Recommendations (Limitations, Scope, Preferences): Full Scope Treatment  Psycho-social/Spiritual:  Desire for further Chaplaincy support:no Created space and opportunity for patient to express thoughts and feelings regarding patient's current medical situation.  Emotional support and therapeutic listening provided.  Prognosis:  Overall poor in the setting of age, recurrent hospitalizations, ESRD not wanting dialysis, and multiple comorbidities  Discharge Planning: To Be Determined      Primary Diagnoses: Present on Admission:  NSTEMI (non-ST elevated myocardial infarction) ([place])  PAF (paroxysmal atrial fibrillation)-CHADS2 Vas score of 5  Dysphagia, pharyngoesophageal phase  Hypertension  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  GAD (generalized anxiety disorder)  COPD (chronic obstructive pulmonary disease) (HCC)  Chronic pain syndrome  PAD (peripheral artery  disease) ([place])  HLD (hyperlipidemia)  Hypothyroidism  Moderate protein-calorie malnutrition (HCC)  Acute on chronic diastolic CHF (congestive heart failure) ([place])   I have reviewed the medical record, interviewed the patient and family, and examined the patient. The following aspects are pertinent.  Past Medical History:  Diagnosis Date   Anemia    Anxiety    takes Ativan prn   Blood transfusion without reported diagnosis 09/15/12   2 units Prbc's   Broken ribs    Chronic back pain    CKD (chronic kidney disease), stage IV (HCC)    Constipation    related to pain meds   COPD (chronic obstructive pulmonary disease) ([place]) 08/10/2012   denies   Depression    Gastrostomy in place [facility])    removed   GERD (gastroesophageal reflux disease)    takes Zantac daily   Headache(784.0)    Hiatal hernia 08/10/2012   History of radiation therapy 10/17/12-11/25/12   supraglottic larynx,high risk neck tumor bed 5880 cGy/28 sessions, high risk lymph node tumor bed 5600 cGy/20 sessions, mod risk lymph node tumor bed 5040 cGy/20 sessions   Hypercholesteremia    takes Pravastatin daily   Hypertension    takes Tribenzor and Atenolol daily   Insomnia    takes Amitriptyline daily   Nausea    takes Zofran prn   PAD (peripheral artery disease) ([place])    noninvasive imaging in 2016   Pneumonia    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 08/08/2012   Shortness of breath dyspnea    Stroke [facility]) 2011   denies. no residual   Uterine cancer [facility])    Social History   Socioeconomic History   Marital status: Widowed    Spouse name: Not on file   Number of children: 4   Years of education: Not on file   Highest education level: Not on file  Occupational History    Comment: retired Regulatory affairs officer  Tobacco Use   Smoking status: Former    Packs/day: 0.25    Years: 50.00    Total pack years: 12.50    Types: Cigarettes    Quit date: 05/27/2013    Years since quitting: 9.4   Smokeless  tobacco: Never  Vaping Use   Vaping Use: Never used  Substance and Sexual Activity   Alcohol use: Yes   Drug use: No   Sexual activity: Not Currently  Other Topics Concern   Not on file  Social History Narrative   ** Merged History Encounter **       Social Determinants of Health   Financial Resource Strain: Low Risk  (10/23/2022)   Overall Financial Resource Strain (CARDIA)    Difficulty of Paying Living Expenses: Not hard at all  Food Insecurity: No Food Insecurity (10/23/2022)   Hunger Vital Sign    Worried About Running Out of Food in the Last Year: Never true    Ran Out of Food in the Last Year: Never true  Transportation Needs: No Transportation Needs (10/23/2022)   PRAPARE - Hydrologist (Medical): No    Lack of Transportation (Non-Medical): No  Physical Activity: Inactive (10/23/2022)   Exercise Vital Sign    Days of Exercise per Week: 0 days    Minutes of Exercise per Session: 0 min  Stress: No Stress Concern Present (10/23/2022)   [place]    Feeling of Stress : Not at all  Social Connections: Socially Isolated (10/23/2022)  Social Licensed conveyancer [NHANES]    Frequency of Communication with Friends and Family: Once a week    Frequency of Social Gatherings with Friends and Family: Once a week    Attends Religious Services: More than 4 times per year    Active Member of [employer] or Organizations: No    Attends Archivist Meetings: Never    Marital Status: Widowed   Family History  Problem Relation Age of Onset   Throat cancer Father    Brain cancer Brother    CAD Neg Hx    Scheduled Meds:  apixaban  2.5 mg Oral BID   [START ON 10/30/2022] aspirin EC  81 mg Oral Daily   carvedilol  25 mg Oral BID WC   cilostazol  100 mg Oral BID   [START ON 10/30/2022] cloNIDine  0.1 mg Transdermal Q Fri   DULoxetine  20 mg Oral Daily   guaiFENesin  1,200  mg Oral BID   hydrALAZINE  125 mg Oral Q8H   ipratropium-albuterol  3 mL Nebulization Q6H   isosorbide mononitrate  60 mg Oral BID   levothyroxine  50 mcg Oral QAC breakfast   minoxidil  2.5 mg Oral BID   pantoprazole  40 mg Oral Daily   rosuvastatin  20 mg Oral Daily   Continuous Infusions: PRN Meds:.acetaminophen, LORazepam, nitroGLYCERIN, ondansetron (ZOFRAN) IV Medications Prior to Admission:  Prior to Admission medications   Medication Sig Start Date End Date Taking? Authorizing Provider  acetaminophen (TYLENOL) 500 MG tablet Take 1,000 mg by mouth as needed for headache.    [provider]  apixaban (ELIQUIS) 2.5 MG TABS tablet Take 1 tablet (2.5 mg total) by mouth 2 (two) times daily. 02/10/22   Ann Koch, MD  carvedilol (COREG) 25 MG tablet TAKE 1 TABLET (25 MG TOTAL) BY MOUTH TWICE A DAY WITH MEALS Patient taking differently: Take 25 mg by mouth 2 (two) times daily with a meal. 05/26/22   Ann Koch, MD  cilostazol (PLETAL) 100 MG tablet TAKE 1 TABLET BY MOUTH TWICE A DAY Patient taking differently: Take 100 mg by mouth 2 (two) times daily. 02/03/22   Ann Koch, MD  cloNIDine (CATAPRES - DOSED IN MG/24 HR) 0.1 mg/24hr patch Place 1 patch (0.1 mg total) onto the skin once a week. 09/18/22   Barb Merino, MD  cloNIDine (CATAPRES) 0.1 MG tablet Take 1 tablet (0.1 mg total) by mouth 4 (four) times daily as needed (for SBP > 180 or DBP > 100). 09/17/22   Barb Merino, MD  diphenoxylate-atropine (LOMOTIL) 2.5-0.025 MG tablet Take 1 tablet by mouth daily as needed for diarrhea or loose stools. 10/23/22   Ann Koch, MD  DULoxetine (CYMBALTA) 20 MG capsule TAKE 1 CAPSULE BY MOUTH EVERY DAY Patient taking differently: Take 20 mg by mouth daily. 07/23/22   Ann Koch, MD  Ensure (ENSURE) Take 237 mLs by mouth daily.    [provider]  fluticasone (FLONASE) 50 MCG/ACT nasal spray SPRAY 2 SPRAYS INTO EACH NOSTRIL EVERY  DAY 01/31/22   Janith Lima, MD  hydrALAZINE (APRESOLINE) 100 MG tablet Take 1 tablet (100 mg total) by mouth 3 (three) times daily. 03/03/22   Ann Koch, MD  isosorbide mononitrate (IMDUR) 60 MG 24 hr tablet Take 1 tablet (60 mg total) by mouth 2 (two) times daily. 09/17/22 10/17/22  Barb Merino, MD  levothyroxine (SYNTHROID) 50 MCG tablet Take 1 tablet (50 mcg total) by mouth daily  before breakfast. Additional refill from pcp 10/20/22   Nche, Charlene Brooke, NP  loperamide (IMODIUM A-D) 2 MG tablet Take 2 mg by mouth as needed for diarrhea or loose stools.    [provider]  LORazepam (ATIVAN) 1 MG tablet Take 1 tablet (1 mg total) by mouth 2 (two) times daily as needed for anxiety. 08/13/22   Ann Koch, MD  minoxidil (LONITEN) 2.5 MG tablet Take 1 tablet (2.5 mg total) by mouth 2 (two) times daily. 10/23/22   Ann Koch, MD  mirtazapine (REMERON) 15 MG tablet TAKE 1 TABLET BY MOUTH EVERYDAY AT BEDTIME 09/18/22   Ann Koch, MD  moxifloxacin (VIGAMOX) 0.5 % ophthalmic solution Place 1 drop into the left eye 3 (three) times daily. 10/20/22   Nche, Charlene Brooke, NP  ondansetron (ZOFRAN) 4 MG tablet TAKE 1 TABLET BY MOUTH EVERY 8 HOURS AS NEEDED FOR NAUSEA AND VOMITING Patient taking differently: Take 4 mg by mouth as needed for nausea or vomiting. 02/03/22   Ann Koch, MD  pantoprazole (PROTONIX) 40 MG tablet Take 1 tablet (40 mg total) by mouth daily. 10/23/22   Ann Koch, MD  rosuvastatin (CRESTOR) 20 MG tablet TAKE 1 TABLET (20 MG TOTAL) BY MOUTH DAILY. Patient taking differently: Take 20 mg by mouth at bedtime. 07/29/22   Ann Koch, MD  sodium bicarbonate 650 MG tablet Take 650 mg by mouth 3 (three) times daily. 10/01/22   [provider]  levocetirizine (XYZAL) 5 MG tablet TAKE 1 TABLET(5 MG) BY MOUTH EVERY EVENING 02/21/20 06/10/20  Janith Lima, MD   Allergies  Allergen Reactions   Xyzal  [Levocetirizine Dihydrochloride] Itching   Augmentin [Amoxicillin-Pot Clavulanate] Diarrhea and Nausea And Vomiting   Tribenzor [Olmesartan-Amlodipine-Hctz] Other (See Comments)    "Hurt the kidneys"   Review of Systems  Respiratory:  Negative for shortness of breath.   Neurological:  Positive for weakness.  All other systems reviewed and are negative.   Physical Exam Vitals and nursing note reviewed.  Constitutional:      General: She is not in acute distress. Pulmonary:     Effort: No respiratory distress.     Comments: Trach in place Skin:    General: Skin is warm and dry.  Neurological:     Mental Status: She is alert and oriented to person, place, and time.     Motor: Weakness present.  Psychiatric:        Attention and Perception: Attention normal.        Behavior: Behavior is cooperative.        Cognition and Memory: Cognition and memory normal.     Vital Signs: BP (!) 143/66   Pulse 70   Temp 98.8 F (37.1 C) (Oral)   Resp 18   Ht '5\' 1"'$  (1.549 m)   Wt 49.9 kg   SpO2 100%   BMI 20.78 kg/m  Pain Scale: 0-10   Pain Score: 0-No pain   SpO2: SpO2: 100 % O2 Device:SpO2: 100 % O2 Flow Rate: .   IO: Intake/output summary: No intake or output data in the 24 hours ending 10/29/22 1336  LBM:   Baseline Weight: Weight: 49.9 kg Most recent weight: Weight: 49.9 kg     Palliative Assessment/Data: PPS 40-50%     Time In: 1430 Time Out: 1530 Time Total: 60 minutes Greater than 50%  of this time was spent counseling and coordinating care related to the above assessment and plan.  Signed by:  Lin Landsman, NP   Please contact Palliative Medicine Team phone at [phone] for questions and concerns.  For individual provider: See Amion  *Portions of this note are a verbal dictation therefore any spelling and/or grammatical errors are due to the "[place]" system interpretation.

## 2022-10-29 NOTE — ED Notes (Signed)
Rn at bedside getting blood when patient coughing and requesting water down her trach. This nurse called RT and they came to bedside to assist.

## 2022-10-30 ENCOUNTER — Inpatient Hospital Stay (HOSPITAL_COMMUNITY): Payer: Medicare Other

## 2022-10-30 DIAGNOSIS — R630 Anorexia: Secondary | ICD-10-CM

## 2022-10-30 DIAGNOSIS — R110 Nausea: Secondary | ICD-10-CM

## 2022-10-30 LAB — TYPE AND SCREEN
ABO/RH(D): O POS
Antibody Screen: NEGATIVE

## 2022-10-30 LAB — CBC WITH DIFFERENTIAL/PLATELET
Abs Immature Granulocytes: 0.03 10*3/uL (ref 0.00–0.07)
Basophils Absolute: 0 10*3/uL (ref 0.0–0.1)
Basophils Relative: 1 %
Eosinophils Absolute: 0.1 10*3/uL (ref 0.0–0.5)
Eosinophils Relative: 2 %
HCT: 24.1 % — ABNORMAL LOW (ref 36.0–46.0)
Hemoglobin: 7.5 g/dL — ABNORMAL LOW (ref 12.0–15.0)
Immature Granulocytes: 1 %
Lymphocytes Relative: 18 %
Lymphs Abs: 1.2 10*3/uL (ref 0.7–4.0)
MCH: 27.6 pg (ref 26.0–34.0)
MCHC: 31.1 g/dL (ref 30.0–36.0)
MCV: 88.6 fL (ref 80.0–100.0)
Monocytes Absolute: 0.5 10*3/uL (ref 0.1–1.0)
Monocytes Relative: 8 %
Neutro Abs: 4.6 10*3/uL (ref 1.7–7.7)
Neutrophils Relative %: 70 %
Platelets: 176 10*3/uL (ref 150–400)
RBC: 2.72 MIL/uL — ABNORMAL LOW (ref 3.87–5.11)
RDW: 14.7 % (ref 11.5–15.5)
WBC: 6.4 10*3/uL (ref 4.0–10.5)
nRBC: 0 % (ref 0.0–0.2)

## 2022-10-30 LAB — IRON AND TIBC
Iron: 40 ug/dL (ref 28–170)
Saturation Ratios: 22 % (ref 10.4–31.8)
TIBC: 186 ug/dL — ABNORMAL LOW (ref 250–450)
UIBC: 146 ug/dL

## 2022-10-30 LAB — BASIC METABOLIC PANEL
Anion gap: 13 (ref 5–15)
BUN: 56 mg/dL — ABNORMAL HIGH (ref 8–23)
CO2: 21 mmol/L — ABNORMAL LOW (ref 22–32)
Calcium: 8.8 mg/dL — ABNORMAL LOW (ref 8.9–10.3)
Chloride: 106 mmol/L (ref 98–111)
Creatinine, Ser: 4.21 mg/dL — ABNORMAL HIGH (ref 0.44–1.00)
GFR, Estimated: 10 mL/min — ABNORMAL LOW (ref >60)
Glucose, Bld: 102 mg/dL — ABNORMAL HIGH (ref 70–99)
Potassium: 4.1 mmol/L (ref 3.5–5.1)
Sodium: 140 mmol/L (ref 135–145)

## 2022-10-30 LAB — FERRITIN: Ferritin: 1029 ng/mL — ABNORMAL HIGH (ref 11–307)

## 2022-10-30 LAB — MAGNESIUM: Magnesium: 2 mg/dL (ref 1.7–2.4)

## 2022-10-30 MED ORDER — IPRATROPIUM-ALBUTEROL 0.5-2.5 (3) MG/3ML IN SOLN
3.0000 mL | Freq: Two times a day (BID) | RESPIRATORY_TRACT | Status: DC
  Administered 2022-10-30 – 2022-10-31 (×2): 3 mL via RESPIRATORY_TRACT
  Filled 2022-10-30 (×2): qty 3

## 2022-10-30 MED ORDER — FUROSEMIDE 40 MG PO TABS
40.0000 mg | ORAL_TABLET | Freq: Every day | ORAL | Status: DC
  Administered 2022-10-30: 40 mg via ORAL
  Filled 2022-10-30: qty 1

## 2022-10-30 NOTE — Progress Notes (Signed)
   10/30/22 1000  Mobility  Activity Ambulated independently in hallway  Level of Assistance Independent  Assistive Device None  Distance Ambulated (ft) 180 ft  Activity Response Tolerated well  Mobility Referral Yes  $Mobility charge 1 Mobility   Mobility Specialist Progress Note  Received pt in bed having no complaints and agreeable to mobility. Pt was asymptomatic throughout ambulation and returned to room w/o fault. Left in bed w/ call bell in reach and all needs met.  Lucious Groves Mobility Specialist

## 2022-10-30 NOTE — Progress Notes (Signed)
Daily Progress Note   Patient Name: [name]       Date: 10/30/2022 DOB: [birth year]  Age: 76 y.o. MRN#: [id number] Attending Physician: Elmarie Shiley, MD Primary Care Physician: Hoyt Koch, MD Admit Date: 10/28/2022  Reason for Consultation/Follow-up: Establishing goals of care  Subjective: Chart review performed. Received report from primary RN - no acute concerns. RN reports patient is with decreased oral intake.  4:00 PM Went to visit patient at bedside for scheduled meeting - son/Eddie, DIL/Jessica, and young grandson present. Patient was lying in bed awake, alert, oriented, and able to participate in conversation. No signs or non-verbal gestures of pain or discomfort noted. No respiratory distress, increased work of breathing, or secretions noted. She denies pain, states she feels much better than yesterday; however, with decreased appetite. She endorses nausea at times with meals, antiemetic helps.   Patient and family decline ipad interpreter. Janett Billow interpreted for patient and does seem to have clear, direct communication with her.   Upon entering the room, Janett Billow tells me patient "doesn't want to talk anything about death." Therapeutic listening provided. Patient tells Janett Billow, "I just want to go in peace" and she "wants to be at home." Patient and family have a clear understanding of her current acute medial situation. Gently offered to patient and family that I would like to discuss how we can meet those goals.  Provided education and counseling at length on the philosophy and benefits of hospice care. Discussed that it offers a holistic approach to care in the setting of end-stage illness, and is about supporting the patient where they are allowing nature to  take it's course. Discussed the hospice team includes RNs, physicians, social workers, and chaplains. They can provide personal care, support for the family, and help keep patient out of the hospital as well as assist with DME needs for home hospice. Education provided on the difference between home vs residential hospice. After detailed discussion, patient is agreeable to hospice services at home, requesting [facility]. Patient is happy to hear she can be supported at home - she does not want to be in a hospice facility. She does not want to be rehospitalized. They have no DME needs.   Therapeutic listening provided as patient tells [place] she wants to be independent for as long as  possible. Validated that would be hospice's goal, too.  Discussed goals while patient is still in house to include comfort care vs continue medical optimization - patient would like to continue to be medically optimized overnight. She is clear she wants to go home tomorrow though.   Patient's biggest concern is her lack of appetite. Counseled patient/family that anorexia is a natural response to disease. Natural trajectory for EOL reviewed. Reviewed it is anticipated patient will continue to decline. Education provided on the concept of comfort feeds; however, reviewed that goal would be to treat symptoms such as nausea to assist in promoting oral intake as patient desires.  Patient and family express appreciation for visit today.  Discussed with patient/family the importance of continued conversation with each other and the medical providers regarding overall plan of care and treatment options, ensuring decisions are within the context of the patient's values and GOCs.    Questions and concerns were addressed. The patient/family was encouraged to call with questions and/or concerns. PMT card was provided.   Length of Stay: 1  Current Medications: Scheduled Meds:   apixaban  2.5 mg Oral BID   aspirin EC  81  mg Oral Daily   carvedilol  25 mg Oral BID WC   cilostazol  100 mg Oral BID   cloNIDine  0.1 mg Transdermal Q Fri   DULoxetine  20 mg Oral Daily   ferrous sulfate  325 mg Oral Q breakfast   furosemide  40 mg Oral Daily   guaiFENesin  1,200 mg Oral BID   hydrALAZINE  125 mg Oral Q8H   ipratropium-albuterol  3 mL Nebulization BID   isosorbide mononitrate  60 mg Oral BID   levothyroxine  50 mcg Oral QAC breakfast   minoxidil  2.5 mg Oral BID   pantoprazole  40 mg Oral Daily   rosuvastatin  20 mg Oral Daily    Continuous Infusions:   PRN Meds: acetaminophen, LORazepam, nitroGLYCERIN, ondansetron (ZOFRAN) IV, mouth rinse  Physical Exam Vitals and nursing note reviewed.  Constitutional:      General: She is not in acute distress. Pulmonary:     Effort: No respiratory distress.  Skin:    General: Skin is warm and dry.  Neurological:     Mental Status: She is alert and oriented to person, place, and time.  Psychiatric:        Attention and Perception: Attention normal.        Behavior: Behavior is cooperative.        Cognition and Memory: Cognition and memory normal.             Vital Signs: BP (!) 107/46   Pulse 73   Temp 98.6 F (37 C) (Oral)   Resp 19   Ht '5\' 2"'$  (1.575 m)   Wt 50 kg   SpO2 92%   BMI 20.17 kg/m  SpO2: SpO2: 92 % O2 Device: O2 Device: Room Air O2 Flow Rate: O2 Flow Rate (L/min): 2 L/min  Intake/output summary:  Intake/Output Summary (Last 24 hours) at 10/30/2022 1541 Last data filed at 10/30/2022 1242 Gross per 24 hour  Intake 480 ml  Output --  Net 480 ml   LBM: Last BM Date : 10/28/22 Baseline Weight: Weight: 49.9 kg Most recent weight: Weight: 50 kg       Palliative Assessment/Data: PPS 50%      Patient Active Problem List   Diagnosis Date Noted   NSTEMI (non-ST elevated myocardial infarction) ([place]) 10/29/2022  Acute on chronic diastolic CHF (congestive heart failure) ([place]) 09/02/2022   Alaryngeal voice 08/13/2022   History  of CVA (cerebrovascular accident) 08/13/2022   Community acquired pneumonia 03/19/2022   Facial swelling 01/30/2022   History of DVT (deep vein thrombosis) 01/05/2022   Anemia in CKD (chronic kidney disease) 01/05/2022   Demand ischemia    History of head and neck cancer-s/p laryngectomy-with tracheostomy 01/02/2022   PAF (paroxysmal atrial fibrillation)-CHADS2 Vas score of 5 01/02/2022   Neck pain 07/11/2021   Aortic atherosclerosis ([place]) 04/09/2021   Moderate protein-calorie malnutrition ([place]) 04/09/2021   Low back pain 01/23/2021   HLD (hyperlipidemia) 01/13/2021   Elevated troponin due to demand ischemia 12/27/2020   Hypothyroidism 02/25/2020   Senile osteoporosis 02/15/2020   PAD (peripheral artery disease) ([place]) 02/15/2020   Tinea corporis 02/15/2020   Gastroesophageal reflux disease without esophagitis 09/09/2019   Osteopenia 01/19/2019   CKD (chronic kidney disease) stage 5, GFR less than 15 ml/min (HCC) 01/19/2019   Diarrhea 11/04/2018   Thiamine deficiency 01/02/2018   Prediabetes 12/28/2017   Dietary folate deficiency anemia 12/28/2017   Moderate episode of recurrent major depressive disorder ([place] 10/22/2017   S/P laryngectomy 11/03/2016   Stenosis of tracheal stoma 06/10/2016   Diverticulum of pharynx 08/21/2015   Esophageal obstruction 07/17/2015   Esophageal stricture 07/17/2015   Major depression, chronic 02/28/2015   Osteoporosis 02/01/2015   Dysphagia, pharyngoesophageal phase 05/22/2014   History of radiation therapy    Insomnia 02/07/2013   History of uterine cancer 11/24/2012   Chronic pain syndrome 09/03/2012   COPD (chronic obstructive pulmonary disease) ([place]) 08/10/2012   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 08/08/2012   Hypertension    GAD (generalized anxiety disorder)     Palliative Care Assessment & Plan   Patient Profile: 76 y.o. female  with past medical history of head and neck cancer s/p laryngectomy, atrial fibrillation,  hypertension, COPD, CKD and hypothyroidism presented to ED on 10/28/22 from home with chest pain and shortness of breath. Patient was admitted on 10/28/2022 with NSTEMI, PAF, dysphagia, moderate protein calorie malnutrition, ESRD not wanting dialysis.   Assessment: Principal Problem:   NSTEMI (non-ST elevated myocardial infarction) ([place]) Active Problems:   Hypertension   GAD (generalized anxiety disorder)   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   COPD (chronic obstructive pulmonary disease) (HCC)   Chronic pain syndrome   History of radiation therapy   Dysphagia, pharyngoesophageal phase   PAD (peripheral artery disease) (HCC)   Hypothyroidism   HLD (hyperlipidemia)   Moderate protein-calorie malnutrition (HCC)   S/P laryngectomy   PAF (paroxysmal atrial fibrillation)-CHADS2 Vas score of 5   History of DVT (deep vein thrombosis)   Acute on chronic diastolic CHF (congestive heart failure) ([place])   Concern about end of life  Recommendations/Plan: Continue to medically optimize without escalation of care overnight Continue DNR/DNI as previously documented Discharge home with hospice tomorrow 11/18, requesting [facility] consult placed; TOC and hospice liaison notified Recommend patient discharge with '4mg'$  zofran ODT q6h PRN nausea PMT will continue to follow peripherally. If there are any imminent needs please call the service directly  Goals of Care and Additional Recommendations: Limitations on Scope of Treatment: No Surgical Procedures and No Tracheostomy  Code Status:    Code Status Orders  (From admission, onward)           Start     Ordered   10/29/22 1312  Do not attempt resuscitation (DNR)  Continuous       Question Answer Comment  In the event of cardiac or respiratory ARREST Do not call a "code blue"   In the event of cardiac or respiratory ARREST Do not perform Intubation, CPR, defibrillation or ACLS   In the event of cardiac or  respiratory ARREST Use medication by any route, position, wound care, and other measures to relive pain and suffering. May use oxygen, suction and manual treatment of airway obstruction as needed for comfort.      10/29/22 1311           Code Status History     Date Active Date Inactive Code Status Order ID Comments User Context   10/29/2022 0545 10/29/2022 1311 Full Code [id number]  Quintella Baton, MD ED   09/09/2022 1159 09/17/2022 2126 DNR [id number]  Acquanetta Chain, DO Inpatient   09/02/2022 2154 09/09/2022 1159 Full Code [id number]  Marcelyn Bruins, MD ED   08/07/2022 0321 08/08/2022 2018 Full Code [id number]  Kayleen Memos, DO ED   01/02/2022 0656 01/08/2022 1744 Full Code [id number]  Kristopher Oppenheim, DO ED   01/02/2022 0427 01/02/2022 0656 Full Code [id number]  Kristopher Oppenheim, DO ED   01/13/2021 2111 01/16/2021 2224 Full Code [id number]  Howerter, Ethelda Chick, DO ED   12/27/2020 0914 01/02/2021 1732 Full Code [id number]  Karmen Bongo, MD ED   08/31/2012 1745 09/14/2012 1507 Full Code [id number]  Roosvelt Harps, RN Inpatient       Prognosis:  < 6 months  Discharge Planning: Home with Hospice  Care plan was discussed with primary RN, patient, patient's DIL and son, TOC, hospice liaison, Dr. Tyrell Antonio  Thank you for allowing the Palliative Medicine Team to assist in the care of this patient.   Total Time 70 minutes Prolonged Time Billed  yes       Greater than 50%  of this time was spent counseling and coordinating care related to the above assessment and plan.  Lin Landsman, NP  Please contact Palliative Medicine Team phone at [phone] for questions and concerns.   *Portions of this note are a verbal dictation therefore any spelling and/or grammatical errors are due to the "[employer]" system interpretation.

## 2022-10-30 NOTE — TOC Initial Note (Signed)
Transition of Care [facility]) - Initial/Assessment Note    Patient Details  Name: [name] MRN: [id number] Date of Birth: 08/13/1946  [facility]) CM/SW Contact:    Zenon Mayo, RN Phone Number: 10/30/2022, 4:50 PM  Clinical Narrative:                 NCM received information patient wans Home Hospice with [facility].  Per Jessical , daughter confirmed.  She lives with patient, and will transport her by car tomorrow.  Cheri will contact the NCM tomorrow following patient. She will not need any DME to be delivered.  Jessical states she will have to transport her by 12 noon.   Expected Discharge Plan: Home w Hospice Care Barriers to Discharge: Continued Medical Work up   Patient Goals and CMS Choice Patient states their goals for this hospitalization and ongoing recovery are:: home with hospice CMS Medicare.gov Compare Post Acute Care list provided to:: Patient Represenative (must comment) Choice offered to / list presented to : Adult Children  Expected Discharge Plan and Services Expected Discharge Plan: Home w Hospice Care In-house Referral: NA Discharge Planning Services: CM Consult Post Acute Care Choice: Hospice Living arrangements for the past 2 months: Single Family Home                   DME Agency: NA       HH Arranged: RN [place] Agency: [place] Date [place]: 10/30/22 Time HH Agency Contacted: 55 Representative spoke with at [place]: [place] Arrangements/Services Living arrangements for the past 2 months: B and E with:: Adult Children Patient language and need for interpreter reviewed:: Yes Do you feel safe going back to the place where you live?: Yes      Need for Family Participation in Patient Care: Yes (Comment) Care giver support system in place?: Yes (comment)   Criminal Activity/Legal Involvement Pertinent to Current Situation/Hospitalization: No - Comment  as needed  Activities of Daily Living      Permission Sought/Granted                  Emotional Assessment Appearance:: Appears stated age       Alcohol / Substance Use: Not Applicable Psych Involvement: No (comment)  Admission diagnosis:  NSTEMI (non-ST elevated myocardial infarction) [facility]) [I21.4] Patient Active Problem List   Diagnosis Date Noted   NSTEMI (non-ST elevated myocardial infarction) ([place]) 10/29/2022   Acute on chronic diastolic CHF (congestive heart failure) ([place]) 09/02/2022   Alaryngeal voice 08/13/2022   History of CVA (cerebrovascular accident) 08/13/2022   Community acquired pneumonia 03/19/2022   Facial swelling 01/30/2022   History of DVT (deep vein thrombosis) 01/05/2022   Anemia in CKD (chronic kidney disease) 01/05/2022   Demand ischemia    History of head and neck cancer-s/p laryngectomy-with tracheostomy 01/02/2022   PAF (paroxysmal atrial fibrillation)-CHADS2 Vas score of 5 01/02/2022   Neck pain 07/11/2021   Aortic atherosclerosis ([place]) 04/09/2021   Moderate protein-calorie malnutrition ([place]) 04/09/2021   Low back pain 01/23/2021   HLD (hyperlipidemia) 01/13/2021   Elevated troponin due to demand ischemia 12/27/2020   Hypothyroidism 02/25/2020   Senile osteoporosis 02/15/2020   PAD (peripheral artery disease) ([place]) 02/15/2020   Tinea corporis 02/15/2020   Gastroesophageal reflux disease without esophagitis 09/09/2019   Osteopenia 01/19/2019   CKD (chronic kidney disease) stage 5, GFR less than 15 ml/min (HCC) 01/19/2019   Diarrhea 11/04/2018  Thiamine deficiency 01/02/2018   Prediabetes 12/28/2017   Dietary folate deficiency anemia 12/28/2017   Moderate episode of recurrent major depressive disorder ([place]) 10/22/2017   S/P laryngectomy 11/03/2016   Stenosis of tracheal stoma 06/10/2016   Diverticulum of pharynx 08/21/2015   Esophageal obstruction 07/17/2015   Esophageal stricture 07/17/2015   Major depression, chronic 02/28/2015    Osteoporosis 02/01/2015   Dysphagia, pharyngoesophageal phase 05/22/2014   History of radiation therapy    Insomnia 02/07/2013   History of uterine cancer 11/24/2012   Chronic pain syndrome 09/03/2012   COPD (chronic obstructive pulmonary disease) ([place]) 08/10/2012   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 08/08/2012    Class: Chronic   Hypertension    GAD (generalized anxiety disorder)    PCP:  Hoyt Koch, MD Pharmacy:   [id number] [place] [place] [id number]: [phone]: [phone] [id number] [place] [id number] [id number]: [phone]: [phone]   Social Determinants of Health (SDOH) Interventions    Readmission Risk Interventions    10/30/2022    4:48 PM 09/04/2022    4:32 PM 09/04/2022    4:22 PM  Readmission Risk Prevention Plan  Transportation Screening Complete Complete Complete  PCP or Specialist Appt within 3-5 Days  Complete Complete  HRI or Ho[place] Complete  Social Work Consult for [place]/Counseling  Complete Complete  Palliative Care Screening  Not Applicable   Medication Review (RPress photographerComplete Complete Complete  HRI or Ho[place]omplete    SW Recovery Care/Counseling Consult Complete    Palliative Care Screening Complete    [place] Applicable

## 2022-10-30 NOTE — Progress Notes (Signed)
Nurse requested Mobility Specialist to perform oxygen saturation test with pt which includes removing pt from oxygen both at rest and while ambulating.  Below are the results from that testing.     Patient Saturations on Room Air at Rest = spO2 92%  Patient Saturations on Room Air while Ambulating = sp02 94% .  Rested and performed pursed lip breathing for 1 minute with sp02 at N/A%.  Patient Saturations on 0 Liters of oxygen while Ambulating = sp02 94%  At end of testing pt left in room on 0  Liters of oxygen.  Reported results to nurse.

## 2022-10-30 NOTE — Plan of Care (Signed)
Pt has poor appetite and eating small portion of each meal. Pt anticipated to discharge home tomorrow.  Problem: Education: Goal: Understanding of cardiac disease, CV risk reduction, and recovery process will improve Outcome: Progressing Goal: Individualized Educational Video(s) Outcome: Progressing   Problem: Activity: Goal: Ability to tolerate increased activity will improve Outcome: Progressing   Problem: Cardiac: Goal: Ability to achieve and maintain adequate cardiovascular perfusion will improve Outcome: Progressing   Problem: Health Behavior/Discharge Planning: Goal: Ability to safely manage health-related needs after discharge will improve Outcome: Progressing   Problem: Education: Goal: Knowledge of General Education information will improve Description: Including pain rating scale, medication(s)/side effects and non-pharmacologic comfort measures Outcome: Progressing   Problem: Health Behavior/Discharge Planning: Goal: Ability to manage health-related needs will improve Outcome: Progressing   Problem: Clinical Measurements: Goal: Ability to maintain clinical measurements within normal limits will improve Outcome: Progressing Goal: Will remain free from infection Outcome: Progressing Goal: Diagnostic test results will improve Outcome: Progressing Goal: Respiratory complications will improve Outcome: Progressing Goal: Cardiovascular complication will be avoided Outcome: Progressing   Problem: Activity: Goal: Risk for activity intolerance will decrease Outcome: Progressing   Problem: Coping: Goal: Level of anxiety will decrease Outcome: Progressing   Problem: Elimination: Goal: Will not experience complications related to bowel motility Outcome: Progressing Goal: Will not experience complications related to urinary retention Outcome: Progressing   Problem: Pain Managment: Goal: General experience of comfort will improve Outcome: Progressing   Problem:  Safety: Goal: Ability to remain free from injury will improve Outcome: Progressing   Problem: Skin Integrity: Goal: Risk for impaired skin integrity will decrease Outcome: Progressing   Problem: Nutrition: Goal: Adequate nutrition will be maintained Outcome: Not Progressing

## 2022-10-30 NOTE — Progress Notes (Signed)
Sats fluctuating 86-91% RA when pt resting in bed. Pt denies CP or SOB. RT notified and RT placed pt on 2L O2 via mask over laryngectomy stoma. Sats increased to 92-93%. Educated pt on sats and use of O2.  (Assessment and education done with use of video translator)

## 2022-10-30 NOTE — Progress Notes (Signed)
PROGRESS NOTE    [name]  LYY:[id number] DOB: [birth year] DOA: 10/28/2022 PCP: Hoyt Koch, MD   Brief Narrative: 76 year old with past medical history significant for head and neck cancer, A-fib, hypertension, COPD, CKD V, hypothyroidism, CVA who presents complaining of chest pain, elevation of troponin found to have non-STEMI.  Patient was evaluated by cardiology who is recommending medical management.  Plan to continue with aspirin and Eliquis.  Patient started on Imdur.     Assessment & Plan:   Principal Problem:   NSTEMI (non-ST elevated myocardial infarction) ([place]) Active Problems:   Hypertension   GAD (generalized anxiety disorder)   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   COPD (chronic obstructive pulmonary disease) (HCC)   Chronic pain syndrome   History of radiation therapy   Dysphagia, pharyngoesophageal phase   PAD (peripheral artery disease) (HCC)   Hypothyroidism   HLD (hyperlipidemia)   Moderate protein-calorie malnutrition (HCC)   S/P laryngectomy   PAF (paroxysmal atrial fibrillation)-CHADS2 Vas score of 5   History of DVT (deep vein thrombosis)   Acute on chronic diastolic CHF (congestive heart failure) ([place])   1-NSTEMI;  Patient presents with chest pain, elevation of troponin 3007. Found to have non-STEMI.  Evaluated by cardiology who is recommending medical management.  Patient is not a candidate for cath due to CKD. Continue with carvedilol, aspirin, Eliquis.  Started on Imdur. Cardiology Increased  hydralazine dose.  Palliative care consulted per cardiology recommendations.  Plan to discharge home tomorrow with hospice care.   2-CKD Stage V: Prior cr 3.9---4.2. Per records.  Patient has Stage V, prior GFR at 11.  Stable.  Continue to monitor.   3-Chronic Diastolic HF;  Received IV lasix in the ED>  Hypoxic last night. Chest x ray suggestive of mild congestive HF. New small pleural effusion.  Lasix 40 mg one time  dose.  Could discharge on PRN lasix.   4-PAF; continue with eliquis.   COPD; start Guaifenesin. Schedule nebulizer. Flutter valve HTN; continue with hydralazine dose increase to 125 3 times daily, continue with Coreg, clonidine and minoxidil  Hypothyroidism: Continue with Synthroid.  Hyperlipidemia: Continue with Crestor.  Protein Caloric Malnutrition; continue with  ensure.  Dysphagia.  H/O [facility] Per family patient eats regular diet.  Evaluated by speech, recommend renal diet.   Left hand weakness, dysphagia.  MRI negative for stroke.  Anemia; monitor hb  Estimated body mass index is 20.17 kg/m as calculated from the following:   Height as of this encounter: '5\' 2"'$  (1.575 m).   Weight as of this encounter: 50 kg.   DVT prophylaxis:  Code Status: Patient wishes to be DNR, family at bedside and over phone.  Family Communication: son at bedside and over phone.  Disposition Plan:  Status is: Inpatient Remains inpatient appropriate because: management of NSTEMI    Consultants:  Cardiology   Procedures:    Antimicrobials:    Subjective: She denies chest pain today. She feels better. Report poor appetite.   Objective: Vitals:   10/30/22 0755 10/30/22 1100 10/30/22 1205 10/30/22 1548  BP: (!) 126/54  (!) 107/46 (!) 120/54  Pulse: 80  73 77  Resp: '17  19 16  '$ Temp: 98.5 F (36.9 C)  98.6 F (37 C) 98.6 F (37 C)  TempSrc: Oral  Oral Oral  SpO2: 92% 94% 92% 95%  Weight:      Height:        Intake/Output Summary (Last 24 hours) at 10/30/2022 1637 Last data  filed at 10/30/2022 1242 Gross per 24 hour  Intake 480 ml  Output --  Net 480 ml   Filed Weights   10/28/22 2318 10/29/22 1642 10/30/22 0448  Weight: 49.9 kg 52.8 kg 50 kg    Examination:  General exam: NAD, tracheostomy in placed.  Respiratory system: BL ronchus Cardiovascular system: S 1, S 2 RRR Gastrointestinal system: BS present, soft, nt Central nervous system: Alert Extremities: no  edema    Data Reviewed: I have personally reviewed following labs and imaging studies  CBC: Recent Labs  Lab 10/28/22 2212 10/29/22 0647 10/30/22 0056  WBC 7.9 7.4 6.4  NEUTROABS 5.8  --  4.6  HGB 8.4* 7.7* 7.5*  HCT 26.0* 24.5* 24.1*  MCV 86.1 87.8 88.6  PLT 186 169 094    Basic Metabolic Panel: Recent Labs  Lab 10/28/22 2212 10/29/22 0539 10/29/22 1532 10/29/22 1829 10/30/22 0056  NA 140  --  140 140 140  K 4.3  --  4.4 4.2 4.1  CL 106  --  109 108 106  CO2 20*  --  20* 20* 21*  GLUCOSE 110*  --  105* 109* 102*  BUN 58*  --  57* 55* 56*  CREATININE 3.99*  --  4.13* 4.13* 4.21*  CALCIUM 9.3  --  8.8* 8.9 8.8*  MG  --  1.9  --   --  2.0    GFR: Estimated Creatinine Clearance: 9 mL/min (A) (by C-G formula based on SCr of 4.21 mg/dL (H)). Liver Function Tests: Recent Labs  Lab 10/28/22 2212  AST 13*  ALT 12  ALKPHOS 78  BILITOT 0.6  PROT 6.8  ALBUMIN 3.3*    No results for input(s): "LIPASE", "AMYLASE" in the last 168 hours. No results for input(s): "AMMONIA" in the last 168 hours. Coagulation Profile: Recent Labs  Lab 10/28/22 2212  INR 1.5*    Cardiac Enzymes: No results for input(s): "CKTOTAL", "CKMB", "CKMBINDEX", "TROPONINI" in the last 168 hours. BNP (last 3 results) Recent Labs    10/20/22 0902  PROBNP 106.0*    HbA1C: No results for input(s): "HGBA1C" in the last 72 hours. CBG: No results for input(s): "GLUCAP" in the last 168 hours. Lipid Profile: No results for input(s): "CHOL", "HDL", "LDLCALC", "TRIG", "CHOLHDL", "LDLDIRECT" in the last 72 hours. Thyroid Function Tests: No results for input(s): "TSH", "T4TOTAL", "FREET4", "T3FREE", "THYROIDAB" in the last 72 hours. Anemia Panel: Recent Labs    10/30/22 0056  FERRITIN 1,029*  TIBC 186*  IRON 40   Sepsis Labs: No results for input(s): "PROCALCITON", "LATICACIDVEN" in the last 168 hours.  Recent Results (from the past 240 hour(s))  Resp Panel by RT-PCR (Flu A&B, Covid)  Anterior Nasal Swab     Status: None   Collection Time: 10/28/22 11:19 PM   Specimen: Anterior Nasal Swab  Result Value Ref Range Status   SARS Coronavirus 2 by RT PCR NEGATIVE NEGATIVE Final    Comment: (NOTE) SARS-CoV-2 target nucleic acids are NOT DETECTED.  The SARS-CoV-2 RNA is generally detectable in upper respiratory specimens during the acute phase of infection. The lowest concentration of SARS-CoV-2 viral copies this assay can detect is 138 copies/mL. A negative result does not preclude SARS-Cov-2 infection and should not be used as the sole basis for treatment or other patient management decisions. A negative result may occur with  improper specimen collection/handling, submission of specimen other than nasopharyngeal swab, presence of viral mutation(s) within the areas targeted by this assay, and inadequate number of  viral copies(<138 copies/mL). A negative result must be combined with clinical observations, patient history, and epidemiological information. The expected result is Negative.  Fact Sheet for Patients:  EntrepreneurPulse.com.au  Fact Sheet for Healthcare Providers:  IncredibleEmployment.be  This test is no t yet approved or cleared by the [place] FDA and  has been authorized for detection and/or diagnosis of SARS-CoV-2 by FDA under an Emergency Use Authorization (EUA). This EUA will remain  in effect (meaning this test can be used) for the duration of the COVID-19 declaration under Section 564(b)(1) of the Act, 21 U.S.C.section 360bbb-3(b)(1), unless the authorization is terminated  or revoked sooner.       Influenza A by PCR NEGATIVE NEGATIVE Final   Influenza B by PCR NEGATIVE NEGATIVE Final    Comment: (NOTE) The Xpert Xpress SARS-CoV-2/FLU/RSV plus assay is intended as an aid in the diagnosis of influenza from Nasopharyngeal swab specimens and should not be used as a sole basis for treatment. Nasal washings  and aspirates are unacceptable for Xpert Xpress SARS-CoV-2/FLU/RSV testing.  Fact Sheet for Patients: EntrepreneurPulse.com.au  Fact Sheet for Healthcare Providers: IncredibleEmployment.be  This test is not yet approved or cleared by the [place] FDA and has been authorized for detection and/or diagnosis of SARS-CoV-2 by FDA under an Emergency Use Authorization (EUA). This EUA will remain in effect (meaning this test can be used) for the duration of the COVID-19 declaration under Section 564(b)(1) of the Act, 21 U.S.C. section 360bbb-3(b)(1), unless the authorization is terminated or revoked.  Performed at [facility], [place] [address] [place],  [address]          Radiology Studies: DG CHEST PORT 1 VIEW  Result Date: 10/30/2022 CLINICAL DATA:  76 year old female with history of hypoxemia. EXAM: PORTABLE CHEST 1 VIEW COMPARISON:  Chest x-ray 10/28/2022. FINDINGS: Lung volumes are normal. Diffuse interstitial prominence and peribronchial cuffing, similar to the prior study. No confluent consolidative airspace disease. Small left pleural effusion. No right pleural effusion. No pneumothorax. There is cephalization of the pulmonary vasculature and slight indistinctness of the interstitial markings suggestive of mild pulmonary edema. Mild cardiomegaly. Upper mediastinal contours are within normal limits. Atherosclerotic calcifications are noted in the thoracic aorta. IMPRESSION: 1. The appearance of the chest suggest mild congestive heart failure. 2. New small left pleural effusion. 3. Aortic atherosclerosis. Electronically Signed   By: Vinnie Langton M.D.   On: 10/30/2022 09:14   ECHOCARDIOGRAM COMPLETE  Result Date: 10/29/2022    ECHOCARDIOGRAM REPORT   Patient Name:   [name] Date of Exam: 10/29/2022 Medical Rec #:  [id number]        Height:       61.0 in Accession #:    [id number]       Weight:       110.0 lb Date of  Birth:  08-30-1946       BSA:          1.465 m Patient Age:    59 years         BP:           143/66 mmHg Patient Gender: F                HR:           70 bpm. Exam Location:  Inpatient Procedure: 2D Echo, Cardiac Doppler and Color Doppler Indications:    Atrial fibrillation  History:        Patient has prior history of Echocardiogram examinations,  most                 recent 08/07/2022. COPD and PAD, Arrythmias:Atrial Fibrillation,                 Signs/Symptoms:Chest Pain; Risk Factors:Hypertension. CKD.                 Elevated troponin.  Sonographer:    Clayton Lefort RDCS (AE) Referring Phys: [phone] DEBBY CROSLEY  Sonographer Comments: Unable to perform IVC collapse test due to presence of trach. IMPRESSIONS  1. LVEF is approximately 45% with hypokinesis of the distal inferoseptal; hypokinesis/akinesis of the distal inferior and apical walls. COmpared to echo from Aug 2023, these findings are new . Left ventricular ejection fraction, by estimation, is 60 to 65%. The left ventricle has normal function. The left ventricle has no regional wall motion abnormalities. There is moderate left ventricular hypertrophy. Left ventricular diastolic parameters are consistent with Grade I diastolic dysfunction (impaired relaxation). Elevated left atrial pressure.  2. Right ventricular systolic function is normal. The right ventricular size is normal.  3. Left atrial size was moderately dilated.  4. Mild mitral valve regurgitation. Moderate mitral annular calcification.  5. The aortic valve is tricuspid. Aortic valve regurgitation is not visualized. Aortic valve sclerosis is present, with no evidence of aortic valve stenosis.  6. Pulmonic valve regurgitation is moderate.  7. The inferior vena cava is normal in size with greater than 50% respiratory variability, suggesting right atrial pressure of 3 mmHg. FINDINGS  Left Ventricle: LVEF is approximately 45% with hypokinesis of the distal inferoseptal; hypokinesis/akinesis of the  distal inferior and apical walls. COmpared to echo from Aug 2023, these findings are new. Left ventricular ejection fraction, by estimation, is 60 to 65%. The left ventricle has normal function. The left ventricle has no regional wall motion abnormalities. The left ventricular internal cavity size was normal in size. There is moderate left ventricular hypertrophy. Left ventricular  diastolic parameters are consistent with Grade I diastolic dysfunction (impaired relaxation). Elevated left atrial pressure. Right Ventricle: The right ventricular size is normal. Right vetricular wall thickness was not assessed. Right ventricular systolic function is normal. Left Atrium: Left atrial size was moderately dilated. Right Atrium: Right atrial size was normal in size. Pericardium: Trivial pericardial effusion is present. Mitral Valve: There is mild thickening of the mitral valve leaflet(s). Moderate mitral annular calcification. Mild mitral valve regurgitation. Tricuspid Valve: The tricuspid valve is normal in structure. Tricuspid valve regurgitation is mild. Aortic Valve: The aortic valve is tricuspid. Aortic valve regurgitation is not visualized. Aortic valve sclerosis is present, with no evidence of aortic valve stenosis. Aortic valve mean gradient measures 4.0 mmHg. Aortic valve peak gradient measures 6.5  mmHg. Aortic valve area, by VTI measures 2.33 cm. Pulmonic Valve: The pulmonic valve was not well visualized. Pulmonic valve regurgitation is moderate. Aorta: The aortic root and ascending aorta are structurally normal, with no evidence of dilitation. Venous: The inferior vena cava is normal in size with greater than 50% respiratory variability, suggesting right atrial pressure of 3 mmHg. IAS/Shunts: No atrial level shunt detected by color flow Doppler.  LEFT VENTRICLE PLAX 2D LVIDd:         4.30 cm   Diastology LVIDs:         2.90 cm   LV e' medial:    5.44 cm/s LV PW:         1.50 cm   LV E/e' medial:  16.1 LV IVS:  1.40 cm   LV e' lateral:   5.66 cm/s LVOT diam:     2.10 cm   LV E/e' lateral: 15.5 LV SV:         65 LV SV Index:   44 LVOT Area:     3.46 cm  RIGHT VENTRICLE             IVC RV Basal diam:  2.60 cm     IVC diam: 2.20 cm RV S prime:     14.30 cm/s TAPSE (M-mode): 2.4 cm LEFT ATRIUM           Index        RIGHT ATRIUM           Index LA diam:      3.00 cm 2.05 cm/m   RA Area:     12.50 cm LA Vol (A2C): 67.6 ml 46.14 ml/m  RA Volume:   28.80 ml  19.66 ml/m LA Vol (A4C): 55.6 ml 37.95 ml/m  AORTIC VALVE AV Area (Vmax):    2.32 cm AV Area (Vmean):   2.29 cm AV Area (VTI):     2.33 cm AV Vmax:           127.00 cm/s AV Vmean:          87.000 cm/s AV VTI:            0.279 m AV Peak Grad:      6.5 mmHg AV Mean Grad:      4.0 mmHg LVOT Vmax:         84.90 cm/s LVOT Vmean:        57.600 cm/s LVOT VTI:          0.188 m LVOT/AV VTI ratio: 0.67  AORTA Ao Root diam: 3.10 cm Ao Asc diam:  3.30 cm MITRAL VALVE MV Area (PHT): 2.84 cm     SHUNTS MV Decel Time: 267 msec     Systemic VTI:  0.19 m MV E velocity: 87.80 cm/s   Systemic Diam: 2.10 cm MV A velocity: 126.00 cm/s MV E/A ratio:  0.70 Dorris Carnes MD Electronically signed by Dorris Carnes MD Signature Date/Time: 10/29/2022/4:48:21 PM    Final    MR BRAIN WO CONTRAST  Result Date: 10/29/2022 CLINICAL DATA:  Neuro deficit with acute stroke suspected. History of laryngectomy for cancer EXAM: MRI HEAD WITHOUT CONTRAST TECHNIQUE: Multiplanar, multiecho pulse sequences of the brain and surrounding structures were obtained without intravenous contrast. COMPARISON:  Head CT from earlier today FINDINGS: Brain: No acute infarction, hemorrhage, hydrocephalus, extra-axial collection or mass lesion. Vascular: Normal flow voids. Skull and upper cervical spine: No focal marrow lesion Sinuses/Orbits: Chronic right sphenoid sinusitis with partial opacification by CT. Other: Laryngectomy seen on sagittal T1 weighted images. IMPRESSION: No acute finding.  Negative for infarct.  Electronically Signed   By: Jorje Guild M.D.   On: 10/29/2022 04:27   CT HEAD WO CONTRAST  Result Date: 10/28/2022 CLINICAL DATA:  Neuro deficit, acute stroke suspected. EXAM: CT HEAD WITHOUT CONTRAST TECHNIQUE: Contiguous axial images were obtained from the base of the skull through the vertex without intravenous contrast. RADIATION DOSE REDUCTION: This exam was performed according to the departmental dose-optimization program which includes automated exposure control, adjustment of the mA and/or kV according to patient size and/or use of iterative reconstruction technique. COMPARISON:  CT examination dated September 02, 2022. FINDINGS: Brain: No evidence of acute infarction, hemorrhage, hydrocephalus, extra-axial collection or mass lesion/mass effect. Vascular: No hyperdense vessel or unexpected  calcification. Skull: Normal. Negative for fracture or focal lesion. Sinuses/Orbits: No acute finding. Other: None. IMPRESSION: No acute intracranial pathology. Electronically Signed   By: Keane Police D.O.   On: 10/28/2022 23:59   DG Chest Portable 1 View  Result Date: 10/28/2022 CLINICAL DATA:  Shortness of breath. EXAM: PORTABLE CHEST 1 VIEW COMPARISON:  Chest radiograph dated September 08, 2022. FINDINGS: The heart is mildly enlarged. Prominent atherosclerotic calcification of the aortic arch. Biapical pleural/parenchymal scarring. No focal consolidation or pleural effusion. IMPRESSION: Mild cardiomegaly. No focal consolidation or pleural effusion. Electronically Signed   By: Keane Police D.O.   On: 10/28/2022 22:30        Scheduled Meds:  apixaban  2.5 mg Oral BID   aspirin EC  81 mg Oral Daily   carvedilol  25 mg Oral BID WC   cilostazol  100 mg Oral BID   cloNIDine  0.1 mg Transdermal Q Fri   DULoxetine  20 mg Oral Daily   ferrous sulfate  325 mg Oral Q breakfast   guaiFENesin  1,200 mg Oral BID   hydrALAZINE  125 mg Oral Q8H   ipratropium-albuterol  3 mL Nebulization BID   isosorbide  mononitrate  60 mg Oral BID   levothyroxine  50 mcg Oral QAC breakfast   minoxidil  2.5 mg Oral BID   pantoprazole  40 mg Oral Daily   rosuvastatin  20 mg Oral Daily   Continuous Infusions:   LOS: 1 day    Time spent: 35 minutes    Jenney Brester A Dail Meece, MD [facility]   If 7PM-7AM, please contact night-coverage [email or web address]  10/30/2022, 4:37 PM

## 2022-10-30 NOTE — Evaluation (Signed)
Clinical/Bedside Swallow Evaluation Patient Details  Name: [name] MRN: [id number] Date of Birth: December 10, 1946  Today's Date: 10/30/2022 Time: SLP Start Time (ACUTE ONLY): 0932 SLP Stop Time (ACUTE ONLY): 0945 SLP Time Calculation (min) (ACUTE ONLY): 13 min  Past Medical History:  Past Medical History:  Diagnosis Date   Anemia    Anxiety    takes Ativan prn   Blood transfusion without reported diagnosis 09/15/12   2 units Prbc's   Broken ribs    Chronic back pain    CKD (chronic kidney disease), stage IV (HCC)    Constipation    related to pain meds   COPD (chronic obstructive pulmonary disease) ([place]) 08/10/2012   denies   Depression    Gastrostomy in place [facility])    removed   GERD (gastroesophageal reflux disease)    takes Zantac daily   Headache(784.0)    Hiatal hernia 08/10/2012   History of radiation therapy 10/17/12-11/25/12   supraglottic larynx,high risk neck tumor bed 5880 cGy/28 sessions, high risk lymph node tumor bed 5600 cGy/20 sessions, mod risk lymph node tumor bed 5040 cGy/20 sessions   Hypercholesteremia    takes Pravastatin daily   Hypertension    takes Tribenzor and Atenolol daily   Insomnia    takes Amitriptyline daily   Nausea    takes Zofran prn   PAD (peripheral artery disease) ([place])    noninvasive imaging in 2016   Pneumonia    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 08/08/2012   Shortness of breath dyspnea    Stroke [facility]) 2011   denies. no residual   Uterine cancer ([place])    Past Surgical History:  Past Surgical History:  Procedure Laterality Date   ABDOMINAL SURGERY     r/t uterine carcinoma   APPENDECTOMY     DIRECT LARYNGOSCOPY N/A 05/22/2014   Procedure: DIRECT LARYNGOSCOPY WITH ESOPHAGEAL DILATION;  Surgeon: Jerrell Belfast, MD;  Location: [place];  Service: ENT;  Laterality: N/A;   ESOPHAGOSCOPY WITH DILITATION N/A 05/29/2015   Procedure: ESOPHAGOSCOPY WITH ESOPHAGEAL DILITATION;  Surgeon: Jerrell Belfast, MD;  Location:  [place];  Service: ENT;  Laterality: N/A;   Gastrostomy Tube removed   2013   GASTROSTOMY W/ FEEDING TUBE  13   HERNIA REPAIR     child   LARYNGETOMY  08/31/2012   Procedure: LARYNGECTOMY;  Surgeon: Jerrell Belfast, MD;  Location: [place];  Service: ENT;  Laterality: N/A;   LARYNGOSCOPY  08/10/2012   Procedure: LARYNGOSCOPY;  Surgeon: Jerrell Belfast, MD;  Location: [facility];  Service: ENT;  Laterality: N/A;  with biopsy   RADICAL NECK DISSECTION  08/31/2012   Procedure: RADICAL NECK DISSECTION;  Surgeon: Jerrell Belfast, MD;  Location: [place];  Service: ENT;  Laterality: Bilateral;   TRACHEAL ESOPHOGEAL PUNCTURE WITH REPAIR STOMA N/A 09/08/2013   Procedure: TRACHEAL ESOPHOGEAL PUNCTURE WITH PLACEMENT OF  PROVOX PROSTHESIS ;  Surgeon: Jerrell Belfast, MD;  Location: [place];  Service: ENT;  Laterality: N/A;   TRACHEOSTOMY TUBE PLACEMENT  08/10/2012   Procedure: TRACHEOSTOMY;  Surgeon: Jerrell Belfast, MD;  Location: [facility];  Service: ENT;  Laterality: N/A;   HPI:  76 y.o. female  with past medical history of head and neck cancer s/p laryngectomy, atrial fibrillation, hypertension, COPD, CKD and hypothyroidism presented to ED on 10/28/22 from home with chest pain and shortness of breath. Patient was admitted on 10/28/2022 with NSTEMI, PAF, dysphagia, moderate protein calorie malnutrition, ESRD not wanting dialysis. Followed by ENT at [facility]; generally  has two esophageal/stoma dilations per year per her daughter.    Assessment / Plan / Recommendation  Clinical Impression  [name] presents with ongoing issues with solid food dysphagia related to narrowing of esophagus. She has trismus secondary to surgery and post-radiation effects, so foods have to be cut into smaller pieces due to poor jaw opening.  She demonstrated adequate control/manipulation and swallowing of soft solids and liquids. Per her family at bedside, she is awaiting dilation of the esophagus, scheduled for this Dec with Dr. Joya Gaskins, her  otolaryngologist at [facility].  There are no issues identified requiring SLP assistance - pt/family have good knowledge of the issues surrounding her 2013 laryngectomy.  Our service will sign off. SLP Visit Diagnosis: Other (comment) (esophageal dysphagia)    Aspiration Risk  No limitations    Diet Recommendation   Renal diet  Medication Administration: Whole meds with liquid          Recommendations for follow up therapy are one component of a multi-disciplinary discharge planning process, led by the attending physician.  Recommendations may be updated based on patient status, additional functional criteria and insurance authorization.  Follow up Recommendations No SLP follow up        Swallow Study   General Date of Onset: 10/28/22 HPI: 76 y.o. female  with past medical history of head and neck cancer s/p laryngectomy, atrial fibrillation, hypertension, COPD, CKD and hypothyroidism presented to ED on 10/28/22 from home with chest pain and shortness of breath. Patient was admitted on 10/28/2022 with NSTEMI, PAF, dysphagia, moderate protein calorie malnutrition, ESRD not wanting dialysis. Followed by ENT at [facility]; generally has two esophageal/stoma dilations per year per her daughter. Type of Study: Bedside Swallow Evaluation Previous Swallow Assessment: no Diet Prior to this Study: Regular;Thin liquids (renal diet) Temperature Spikes Noted: No Respiratory Status: Room air History of Recent Intubation: No Behavior/Cognition: Alert;Cooperative Oral Cavity Assessment: Within Functional Limits Oral Care Completed by SLP: No Vision: Functional for self-feeding Self-Feeding Abilities: Able to feed self Patient Positioning: Upright in bed Baseline Vocal Quality: Not observed Volitional Swallow: Able to elicit    Oral/Motor/Sensory Function Overall Oral Motor/Sensory Function: Other (comment) (trismus, s/p neck resection and laryngectomy 2013)   Ice Chips Ice chips: Not tested   Thin  Liquid Thin Liquid: Within functional limits    Nectar Thick Nectar Thick Liquid: Not tested   Honey Thick Honey Thick Liquid: Not tested   Puree Puree: Within functional limits   Solid     Solid: Within functional limits      Juan Quam Laurice 10/30/2022,10:00 AM  Estill Bamberg L. Tivis Ringer, MA CCC/SLP Clinical Specialist - [place] Office number [phone]

## 2022-10-30 NOTE — Progress Notes (Addendum)
Rounding Note    Patient Name: [name] Date of Encounter: 10/30/2022  [facility] Cardiologist: Mertie Moores, MD   Subjective  Pt denies chest pain, SOB or palpitations.She is comfortably laying in bed without acute visible distress.  He O2 was recorder low this morning. She denied CP or SOB at that time. SpO2 improved with supplement O2. She is currently at 97% on room air. Vitals stable. Her blood pressure is well controlled with the change in hydralazine dosage from 100 to 125 mg three times daily.  Daughter-in law at the bed side  Inpatient Medications    Scheduled Meds:  apixaban  2.5 mg Oral BID   aspirin EC  81 mg Oral Daily   carvedilol  25 mg Oral BID WC   cilostazol  100 mg Oral BID   cloNIDine  0.1 mg Transdermal Q Fri   DULoxetine  20 mg Oral Daily   ferrous sulfate  325 mg Oral Q breakfast   guaiFENesin  1,200 mg Oral BID   hydrALAZINE  125 mg Oral Q8H   ipratropium-albuterol  3 mL Nebulization BID   isosorbide mononitrate  60 mg Oral BID   levothyroxine  50 mcg Oral QAC breakfast   minoxidil  2.5 mg Oral BID   pantoprazole  40 mg Oral Daily   rosuvastatin  20 mg Oral Daily   Continuous Infusions:  PRN Meds: acetaminophen, LORazepam, nitroGLYCERIN, ondansetron (ZOFRAN) IV, mouth rinse   Vital Signs    Vitals:   10/30/22 0227 10/30/22 0448 10/30/22 0506 10/30/22 0755  BP:  (!) 104/48 (!) 131/55 (!) 126/54  Pulse:  74 77 80  Resp:  '15 16 17  '$ Temp:  98.3 F (36.8 C)  98.5 F (36.9 C)  TempSrc:  Oral  Oral  SpO2: 94% 93% 93% 92%  Weight:  50 kg    Height:        Intake/Output Summary (Last 24 hours) at 10/30/2022 1001 Last data filed at 10/30/2022 0900 Gross per 24 hour  Intake 240 ml  Output --  Net 240 ml      10/30/2022    4:48 AM 10/29/2022    4:42 PM 10/28/2022   11:18 PM  Last 3 Weights  Weight (lbs) 110 lb 4.8 oz 116 lb 6.5 oz 110 lb  Weight (kg) 50.032 kg 52.8 kg 49.896 kg      Telemetry    NSR -  Personally Reviewed  ECG    No new EKG- Personally Reviewed  Physical Exam  Pt comfortably laying in bed in supine position. Denies any physical complaints.   GEN: No acute distress.  Neck: No JVD Cardiac: RRR, no murmurs, rubs, or gallops.  Respiratory: Bibasilar coarse crackles. No wheeze or airflow limitation noted.  GI: Soft, nontender, non-distended  MS: No edema; No deformity. Neuro:  Nonfocal  Psych: Normal affect. A&O x3.   Labs    High Sensitivity Troponin:   Recent Labs  Lab 10/28/22 2212 10/29/22 0034 10/29/22 0339 10/29/22 0539  TROPONINIHS 1,763* 2,314* 3,449* 3,172*     Chemistry Recent Labs  Lab 10/28/22 2212 10/29/22 0539 10/29/22 1532 10/29/22 1829 10/30/22 0056  NA 140  --  140 140 140  K 4.3  --  4.4 4.2 4.1  CL 106  --  109 108 106  CO2 20*  --  20* 20* 21*  GLUCOSE 110*  --  105* 109* 102*  BUN 58*  --  57* 55* 56*  CREATININE 3.99*  --  4.13* 4.13* 4.21*  CALCIUM 9.3  --  8.8* 8.9 8.8*  MG  --  1.9  --   --  2.0  PROT 6.8  --   --   --   --   ALBUMIN 3.3*  --   --   --   --   AST 13*  --   --   --   --   ALT 12  --   --   --   --   ALKPHOS 78  --   --   --   --   BILITOT 0.6  --   --   --   --   GFRNONAA 11*  --  11* 11* 10*  ANIONGAP 14  --  '11 12 13    '$ Lipids No results for input(s): "CHOL", "TRIG", "HDL", "LABVLDL", "LDLCALC", "CHOLHDL" in the last 168 hours.  Hematology Recent Labs  Lab 10/28/22 2212 10/29/22 0647 10/30/22 0056  WBC 7.9 7.4 6.4  RBC 3.02* 2.79* 2.72*  HGB 8.4* 7.7* 7.5*  HCT 26.0* 24.5* 24.1*  MCV 86.1 87.8 88.6  MCH 27.8 27.6 27.6  MCHC 32.3 31.4 31.1  RDW 14.7 14.6 14.7  PLT 186 169 176   Thyroid No results for input(s): "TSH", "FREET4" in the last 168 hours.  BNP Recent Labs  Lab 10/28/22 2212  BNP 559.7*    DDimer No results for input(s): "DDIMER" in the last 168 hours.   Radiology    DG CHEST PORT 1 VIEW  Result Date: 10/30/2022 CLINICAL DATA:  76 year old female with history of  hypoxemia. EXAM: PORTABLE CHEST 1 VIEW COMPARISON:  Chest x-ray 10/28/2022. FINDINGS: Lung volumes are normal. Diffuse interstitial prominence and peribronchial cuffing, similar to the prior study. No confluent consolidative airspace disease. Small left pleural effusion. No right pleural effusion. No pneumothorax. There is cephalization of the pulmonary vasculature and slight indistinctness of the interstitial markings suggestive of mild pulmonary edema. Mild cardiomegaly. Upper mediastinal contours are within normal limits. Atherosclerotic calcifications are noted in the thoracic aorta. IMPRESSION: 1. The appearance of the chest suggest mild congestive heart failure. 2. New small left pleural effusion. 3. Aortic atherosclerosis. Electronically Signed   By: Vinnie Langton M.D.   On: 10/30/2022 09:14   ECHOCARDIOGRAM COMPLETE  Result Date: 10/29/2022    ECHOCARDIOGRAM REPORT   Patient Name:   [name] Date of Exam: 10/29/2022 Medical Rec #:  [id number]        Height:       61.0 in Accession #:    [id number]       Weight:       110.0 lb Date of Birth:  1946/05/12       BSA:          1.465 m Patient Age:    26 years         BP:           143/66 mmHg Patient Gender: F                HR:           70 bpm. Exam Location:  Inpatient Procedure: 2D Echo, Cardiac Doppler and Color Doppler Indications:    Atrial fibrillation  History:        Patient has prior history of Echocardiogram examinations, most                 recent 08/07/2022. COPD and PAD, Arrythmias:Atrial Fibrillation,  Signs/Symptoms:Chest Pain; Risk Factors:Hypertension. CKD.                 Elevated troponin.  Sonographer:    Clayton Lefort RDCS (AE) Referring Phys: [phone] DEBBY CROSLEY  Sonographer Comments: Unable to perform IVC collapse test due to presence of trach. IMPRESSIONS  1. LVEF is approximately 45% with hypokinesis of the distal inferoseptal; hypokinesis/akinesis of the distal inferior and apical walls. COmpared to echo from  Aug 2023, these findings are new . Left ventricular ejection fraction, by estimation, is 60 to 65%. The left ventricle has normal function. The left ventricle has no regional wall motion abnormalities. There is moderate left ventricular hypertrophy. Left ventricular diastolic parameters are consistent with Grade I diastolic dysfunction (impaired relaxation). Elevated left atrial pressure.  2. Right ventricular systolic function is normal. The right ventricular size is normal.  3. Left atrial size was moderately dilated.  4. Mild mitral valve regurgitation. Moderate mitral annular calcification.  5. The aortic valve is tricuspid. Aortic valve regurgitation is not visualized. Aortic valve sclerosis is present, with no evidence of aortic valve stenosis.  6. Pulmonic valve regurgitation is moderate.  7. The inferior vena cava is normal in size with greater than 50% respiratory variability, suggesting right atrial pressure of 3 mmHg. FINDINGS  Left Ventricle: LVEF is approximately 45% with hypokinesis of the distal inferoseptal; hypokinesis/akinesis of the distal inferior and apical walls. COmpared to echo from Aug 2023, these findings are new. Left ventricular ejection fraction, by estimation, is 60 to 65%. The left ventricle has normal function. The left ventricle has no regional wall motion abnormalities. The left ventricular internal cavity size was normal in size. There is moderate left ventricular hypertrophy. Left ventricular  diastolic parameters are consistent with Grade I diastolic dysfunction (impaired relaxation). Elevated left atrial pressure. Right Ventricle: The right ventricular size is normal. Right vetricular wall thickness was not assessed. Right ventricular systolic function is normal. Left Atrium: Left atrial size was moderately dilated. Right Atrium: Right atrial size was normal in size. Pericardium: Trivial pericardial effusion is present. Mitral Valve: There is mild thickening of the mitral valve  leaflet(s). Moderate mitral annular calcification. Mild mitral valve regurgitation. Tricuspid Valve: The tricuspid valve is normal in structure. Tricuspid valve regurgitation is mild. Aortic Valve: The aortic valve is tricuspid. Aortic valve regurgitation is not visualized. Aortic valve sclerosis is present, with no evidence of aortic valve stenosis. Aortic valve mean gradient measures 4.0 mmHg. Aortic valve peak gradient measures 6.5  mmHg. Aortic valve area, by VTI measures 2.33 cm. Pulmonic Valve: The pulmonic valve was not well visualized. Pulmonic valve regurgitation is moderate. Aorta: The aortic root and ascending aorta are structurally normal, with no evidence of dilitation. Venous: The inferior vena cava is normal in size with greater than 50% respiratory variability, suggesting right atrial pressure of 3 mmHg. IAS/Shunts: No atrial level shunt detected by color flow Doppler.  LEFT VENTRICLE PLAX 2D LVIDd:         4.30 cm   Diastology LVIDs:         2.90 cm   LV e' medial:    5.44 cm/s LV PW:         1.50 cm   LV E/e' medial:  16.1 LV IVS:        1.40 cm   LV e' lateral:   5.66 cm/s LVOT diam:     2.10 cm   LV E/e' lateral: 15.5 LV SV:  65 LV SV Index:   44 LVOT Area:     3.46 cm  RIGHT VENTRICLE             IVC RV Basal diam:  2.60 cm     IVC diam: 2.20 cm RV S prime:     14.30 cm/s TAPSE (M-mode): 2.4 cm LEFT ATRIUM           Index        RIGHT ATRIUM           Index LA diam:      3.00 cm 2.05 cm/m   RA Area:     12.50 cm LA Vol (A2C): 67.6 ml 46.14 ml/m  RA Volume:   28.80 ml  19.66 ml/m LA Vol (A4C): 55.6 ml 37.95 ml/m  AORTIC VALVE AV Area (Vmax):    2.32 cm AV Area (Vmean):   2.29 cm AV Area (VTI):     2.33 cm AV Vmax:           127.00 cm/s AV Vmean:          87.000 cm/s AV VTI:            0.279 m AV Peak Grad:      6.5 mmHg AV Mean Grad:      4.0 mmHg LVOT Vmax:         84.90 cm/s LVOT Vmean:        57.600 cm/s LVOT VTI:          0.188 m LVOT/AV VTI ratio: 0.67  AORTA Ao Root diam:  3.10 cm Ao Asc diam:  3.30 cm MITRAL VALVE MV Area (PHT): 2.84 cm     SHUNTS MV Decel Time: 267 msec     Systemic VTI:  0.19 m MV E velocity: 87.80 cm/s   Systemic Diam: 2.10 cm MV A velocity: 126.00 cm/s MV E/A ratio:  0.70 Dorris Carnes MD Electronically signed by Dorris Carnes MD Signature Date/Time: 10/29/2022/4:48:21 PM    Final    MR BRAIN WO CONTRAST  Result Date: 10/29/2022 CLINICAL DATA:  Neuro deficit with acute stroke suspected. History of laryngectomy for cancer EXAM: MRI HEAD WITHOUT CONTRAST TECHNIQUE: Multiplanar, multiecho pulse sequences of the brain and surrounding structures were obtained without intravenous contrast. COMPARISON:  Head CT from earlier today FINDINGS: Brain: No acute infarction, hemorrhage, hydrocephalus, extra-axial collection or mass lesion. Vascular: Normal flow voids. Skull and upper cervical spine: No focal marrow lesion Sinuses/Orbits: Chronic right sphenoid sinusitis with partial opacification by CT. Other: Laryngectomy seen on sagittal T1 weighted images. IMPRESSION: No acute finding.  Negative for infarct. Electronically Signed   By: Jorje Guild M.D.   On: 10/29/2022 04:27   CT HEAD WO CONTRAST  Result Date: 10/28/2022 CLINICAL DATA:  Neuro deficit, acute stroke suspected. EXAM: CT HEAD WITHOUT CONTRAST TECHNIQUE: Contiguous axial images were obtained from the base of the skull through the vertex without intravenous contrast. RADIATION DOSE REDUCTION: This exam was performed according to the departmental dose-optimization program which includes automated exposure control, adjustment of the mA and/or kV according to patient size and/or use of iterative reconstruction technique. COMPARISON:  CT examination dated September 02, 2022. FINDINGS: Brain: No evidence of acute infarction, hemorrhage, hydrocephalus, extra-axial collection or mass lesion/mass effect. Vascular: No hyperdense vessel or unexpected calcification. Skull: Normal. Negative for fracture or focal  lesion. Sinuses/Orbits: No acute finding. Other: None. IMPRESSION: No acute intracranial pathology. Electronically Signed   By: Keane Police D.O.   On: 10/28/2022 23:59  DG Chest Portable 1 View  Result Date: 10/28/2022 CLINICAL DATA:  Shortness of breath. EXAM: PORTABLE CHEST 1 VIEW COMPARISON:  Chest radiograph dated September 08, 2022. FINDINGS: The heart is mildly enlarged. Prominent atherosclerotic calcification of the aortic arch. Biapical pleural/parenchymal scarring. No focal consolidation or pleural effusion. IMPRESSION: Mild cardiomegaly. No focal consolidation or pleural effusion. Electronically Signed   By: Keane Police D.O.   On: 10/28/2022 22:30    Cardiac Studies   ECHOCARDIOGRAM REPORT : 10/29/2022   Patient Name:   JAELEE LAUGHTER Date of Exam: 10/29/2022  Medical Rec #:  [id number]        Height:       61.0 in  Accession #:    [id number]       Weight:       110.0 lb  Date of Birth:  07/29/1946       BSA:          1.465 m  Patient Age:    8 years         BP:           143/66 mmHg  Patient Gender: F                HR:           70 bpm.  Exam Location:  Inpatient   Procedure: 2D Echo, Cardiac Doppler and Color Doppler   Indications:    Atrial fibrillation    History:        Patient has prior history of Echocardiogram examinations,  most                 recent 08/07/2022. COPD and PAD, Arrythmias:Atrial  Fibrillation,                 Signs/Symptoms:Chest Pain; Risk Factors:Hypertension. CKD.                  Elevated troponin.    Sonographer:    Clayton Lefort RDCS (AE)  Referring Phys: [phone] DEBBY CROSLEY     Sonographer Comments: Unable to perform IVC collapse test due to presence  of trach.    IMPRESSIONS     1. LVEF is approximately 45% with hypokinesis of the distal inferoseptal; hypokinesis/akinesis of the distal inferior and apical walls. Compared to echo from Aug 2023, these findings are new . Left ventricular ejection fraction, by estimation, is 60 to  65%. The left ventricle has normal function. The left ventricle has no  regional wall motion abnormalities. There is moderate left ventricular  hypertrophy. Left ventricular diastolic parameters are consistent with  Grade I diastolic dysfunction (impaired  relaxation). Elevated left atrial pressure.   2. Right ventricular systolic function is normal. The right ventricular  size is normal.   3. Left atrial size was moderately dilated.   4. Mild mitral valve regurgitation. Moderate mitral annular  calcification.   5. The aortic valve is tricuspid. Aortic valve regurgitation is not  visualized. Aortic valve sclerosis is present, with no evidence of aortic  valve stenosis.   6. Pulmonic valve regurgitation is moderate.   7. The inferior vena cava is normal in size with greater than 50%  respiratory variability, suggesting right atrial pressure of 3 mmHg.     LEFT VENTRICLE  PLAX 2D  LVIDd:         4.30 cm   Diastology  LVIDs:         2.90 cm   LV e' medial:  5.44 cm/s  LV PW:         1.50 cm   LV E/e' medial:  16.1  LV IVS:        1.40 cm   LV e' lateral:   5.66 cm/s  LVOT diam:     2.10 cm   LV E/e' lateral: 15.5  LV SV:         65  LV SV Index:   44  LVOT Area:     3.46 cm     RIGHT VENTRICLE             IVC  RV Basal diam:  2.60 cm     IVC diam: 2.20 cm  RV S prime:     14.30 cm/s  TAPSE (M-mode): 2.4 cm   LEFT ATRIUM           Index        RIGHT ATRIUM           Index  LA diam:      3.00 cm 2.05 cm/m   RA Area:     12.50 cm  LA Vol (A2C): 67.6 ml 46.14 ml/m  RA Volume:   28.80 ml  19.66 ml/m  LA Vol (A4C): 55.6 ml 37.95 ml/m   AORTIC VALVE  AV Area (Vmax):    2.32 cm  AV Area (Vmean):   2.29 cm  AV Area (VTI):     2.33 cm  AV Vmax:           127.00 cm/s  AV Vmean:          87.000 cm/s  AV VTI:            0.279 m  AV Peak Grad:      6.5 mmHg  AV Mean Grad:      4.0 mmHg  LVOT Vmax:         84.90 cm/s  LVOT Vmean:        57.600 cm/s  LVOT VTI:          0.188  m  LVOT/AV VTI ratio: 0.67    AORTA  Ao Root diam: 3.10 cm  Ao Asc diam:  3.30 cm   MITRAL VALVE  MV Area (PHT): 2.84 cm     SHUNTS  MV Decel Time: 267 msec     Systemic VTI:  0.19 m  MV E velocity: 87.80 cm/s   Systemic Diam: 2.10 cm  MV A velocity: 126.00 cm/s  MV E/A ratio:  0.70   Dorris Carnes MD  Electronically signed by Dorris Carnes MD  Signature Date/Time: 10/29/2022/4:48:21 PM    ECHOCARDIOGRAM REPORT  : 08/07/2022    Patient Name:   ALGIE CALES Date of Exam: 08/07/2022  Medical Rec #:  [id number]        Height:       63.0 in  Accession #:    [id number]       Weight:       122.4 lb  Date of Birth:  [birth year]       BSA:          1.569 m  Patient Age:    65 years         BP:           183/93 mmHg  Patient Gender: F                HR:           93 bpm.  Exam Location:  Inpatient   Procedure: 2D Echo, Cardiac Doppler and Color Doppler   Indications:    Elevated troponin    History:        Patient has prior history of Echocardiogram examinations,  most                 recent 01/02/2022. COPD; Risk Factors:Hypertension and                  Dyslipidemia.    Sonographer:    Jefferey Pica  Referring Phys: [phone] [place]     1. Left ventricular ejection fraction, by estimation, is >75%. The left  ventricle has hyperdynamic function. The left ventricle has no regional  wall motion abnormalities. There is moderate concentric left ventricular  hypertrophy. Left ventricular  diastolic parameters are consistent with Grade I diastolic dysfunction  (impaired relaxation).   2. Right ventricular systolic function is normal. The right ventricular  size is normal. There is normal pulmonary artery systolic pressure.   3. Left atrial size was moderately dilated.   4. The mitral valve is normal in structure. No evidence of mitral valve  regurgitation. No evidence of mitral stenosis.   5. The aortic valve is tricuspid. Aortic valve regurgitation is not   visualized. No aortic stenosis is present.   6. The inferior vena cava is normal in size with <50% respiratory  variability, suggesting right atrial pressure of 8 mmHg.         Patient Profile    LIYANA SUNIGA is a 76 y.o. female with a hx of HFpEF, atrial fibrillation, hypertension, hyperlipidemia, PAD, COPD, CKD 5, history of head neck cancer status post laryngectomy with stoma, dysphagia, anxiety, depression,chronic pain, GERD, hypothyroidism, anemia, CVA who is being seen for the evaluation of chest pain, elevated troponin diagnosed with NSTEMI.  Assessment & Plan    NSTEMI  # Chest pain  Pt presented with chest pain and noted elevated troponin level of 1,763>> 2,314>>3,449>>3.172. With her CKD 4/5 and S cr 3.9 and patient do not desire hemodialysis. Shared decision making discussion with family members on medical management TOC to palliative care which was initiated yesterday. Meeting with palliative care team today at 4 pm.  Pt denies chest pain, palpitations or SOB on evaluation today.  She did not experience another episode of chest pain since yesterday. He BP remains well controlled with the change in dosage of hydralazine from 100 mg three times to 125 mg 3 times a day. She remains in normal sinus rhythm. Trace bibasilar coarse crackles to auscultation.  No pedal edema or signs of volume overload.  Patient is on anticoagulation with apixaban and ASA.  -Continue Apixaban 2.5 mg BID -Continue Asa 81 mg -Rosuvastatin to 40 mg.   2) CKD Stage V   Not on dialysis (Pt declined to be on dialysis). No signs of volume overload. Trace bibasilar coarse crackles. Abdomen soft, non distended. No pedal edema. -S. Cr: 4.2. Base line S Cr >4.0    3) HTN: BP at goal < 130/80 Continue home BP treatment regimen -Carvedilol 25 mg -Clonidine 0.1 mg -Increase Hydralazine 125 mg - continue Imdur 60 mg     4) HFpEF: No SOB. Trace bibasilar coarse crackles. No pedal edema. No signs of  volume overload.  Blood pressure management with home antihypertensive regimen. Although no obvious signs of volume overload, she may benefit form diuretic in the setting of chest pain ECHO 10/29/22: LVEF is approximately  45% with hypokinesis of the distal inferoseptal; hypokinesis/akinesis of the distal inferior and apical walls. Compared to echo from Aug 2023, these findings are new . Left ventricular ejection fraction, by estimation, is 60 to 65%. The left ventricle has normal function. Decrease in LVEF with hypokinesia is suggestive of myocardial injury. However pt is appropriate for medical management given her co morbidities and frail condition.        For questions or updates, please contact [place] Please consult [email or web address] for contact info under        Signed, Teola Bradley, MD  10/30/2022, 10:01 AM    [id number]   ATTENDING ATTESTATION:  After conducting a review of all available clinical information with the care team, interviewing the patient, and performing a physical exam, I agree with the findings and plan described in this note.   GEN: No acute distress.  Frail HEENT:  MMM, no JVD, no scleral icterus; tracheostomy in place Cardiac: RRR, no murmurs, rubs, or gallops.  Respiratory: Coarse bilaterally GI: Soft, nontender, non-distended  MS: No edema; No deformity. Neuro:  Nonfocal  Vasc:  +1 radial pulses  Patient without recurrent chest pain and BP improved with med changes.  Patient is breathing well.  TTE with new apical WMA but preserved EF.  Cont Eliquis, would d/c ASA given low hgb, cont coreg, crestor.  Further management per primary team.  Palliative care to meet with family today.  No need for further cardiology evaluation.  Discussed with family at bedside.  Will sign off, call with ?s   Lenna Sciara, MD Pager [phone]

## 2022-10-31 LAB — LIPOPROTEIN A (LPA): Lipoprotein (a): 244.2 nmol/L — ABNORMAL HIGH (ref <75.0)

## 2022-10-31 MED ORDER — NITROGLYCERIN 0.4 MG SL SUBL: 0.4000 mg | SUBLINGUAL_TABLET | SUBLINGUAL | 1 refills | Status: DC | PRN

## 2022-10-31 MED ORDER — GUAIFENESIN ER 600 MG PO TB12: 1200.0000 mg | ORAL_TABLET | Freq: Two times a day (BID) | ORAL | 0 refills | Status: DC

## 2022-10-31 MED ORDER — IPRATROPIUM-ALBUTEROL 0.5-2.5 (3) MG/3ML IN SOLN: 3.0000 mL | Freq: Four times a day (QID) | RESPIRATORY_TRACT | Status: DC | PRN

## 2022-10-31 MED ORDER — HYDRALAZINE HCL 25 MG PO TABS: 125.0000 mg | ORAL_TABLET | Freq: Three times a day (TID) | ORAL | 1 refills | Status: DC

## 2022-10-31 MED ORDER — IPRATROPIUM-ALBUTEROL 0.5-2.5 (3) MG/3ML IN SOLN: 3.0000 mL | Freq: Two times a day (BID) | RESPIRATORY_TRACT | 0 refills | Status: DC

## 2022-10-31 MED ORDER — ASPIRIN 81 MG PO TBEC: 81.0000 mg | DELAYED_RELEASE_TABLET | Freq: Every day | ORAL | 12 refills | Status: DC

## 2022-10-31 MED ORDER — FUROSEMIDE 40 MG PO TABS: 40.0000 mg | ORAL_TABLET | Freq: Every day | ORAL | 11 refills | Status: DC | PRN

## 2022-10-31 MED ORDER — FERROUS SULFATE 325 (65 FE) MG PO TABS: 325.0000 mg | ORAL_TABLET | Freq: Every day | ORAL | 3 refills | Status: DC

## 2022-10-31 NOTE — Progress Notes (Signed)
   10/31/22 1055  Mobility  Activity Ambulated independently in hallway  Level of Assistance Independent  Assistive Device None  Distance Ambulated (ft) 180 ft  Activity Response Tolerated well  Mobility Referral Yes  $Mobility charge 1 Mobility   Mobility Specialist Progress Note  Received pt in bed having no complaints and agreeable to mobility. Pt was asymptomatic throughout ambulation and returned to room w/o fault. Left in bed w/ call bell in reach and all needs met.   Lucious Groves Mobility Specialist

## 2022-10-31 NOTE — Discharge Summary (Signed)
Physician Discharge Summary   Patient: [name] MRN: [id number] DOB: Dec 06, 1946  Admit date:     10/28/2022  Discharge date: 10/31/22  Discharge Physician: Elmarie Shiley   PCP: Hoyt Koch, MD   Recommendations at discharge:    Home with Hospice care.   Discharge Diagnoses: Principal Problem:   NSTEMI (non-ST elevated myocardial infarction) ([place]) Active Problems:   Hypertension   GAD (generalized anxiety disorder)   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   COPD (chronic obstructive pulmonary disease) (HCC)   Chronic pain syndrome   History of radiation therapy   Dysphagia, pharyngoesophageal phase   PAD (peripheral artery disease) (HCC)   Hypothyroidism   HLD (hyperlipidemia)   Moderate protein-calorie malnutrition (HCC)   S/P laryngectomy   PAF (paroxysmal atrial fibrillation)-CHADS2 Vas score of 5   History of DVT (deep vein thrombosis)   Acute on chronic diastolic CHF (congestive heart failure) ([place])  Resolved Problems:   * No resolved hospital problems. *  Hospital Course: 76 year old with past medical history significant for head and neck cancer, A-fib, hypertension, COPD, CKD V, hypothyroidism, CVA who presents complaining of chest pain, elevation of troponin found to have non-STEMI.  Patient was evaluated by cardiology who is recommending medical management.  Plan to continue with aspirin and Eliquis.  Patient started on Imdur.    Patient admitted with NSTEMI, HTN, CKD stage V. Treated with medical management, not candidate for cath due to CKD. Patient declined HD> BP medication adjusted. She will be discharge home with hospice care.   Assessment and Plan: 1-NSTEMI;  Patient presents with chest pain, elevation of troponin 3007. Found to have non-STEMI.  Evaluated by cardiology who is recommending medical management.  Patient is not a candidate for cath due to CKD. Continue with carvedilol, aspirin, Eliquis.  Started on  Imdur. Cardiology Increased  hydralazine dose.  Palliative care consulted per cardiology recommendations.  Plan to discharge home today  with hospice care.    2-CKD Stage V: Prior cr 3.9---4.2. Per records.  Patient has Stage V, prior GFR at 11.  Stable.  Continue to monitor.  PRN lasix at discharge for LE edema, or Dyspnea.   3-Chronic Diastolic HF;  Received IV lasix in the ED>  Hypoxic last night. Chest x ray suggestive of mild congestive HF. New small pleural effusion.  Received Lasix 40 mg one time dose 11/17.  Discharge on PRN lasix.    4-PAF; continue with eliquis.    COPD; Started Guaifenesin. Schedule nebulizer. Flutter valve. DME nebulizer HTN; continue with hydralazine dose increase to 125 3 times daily, continue with Coreg, clonidine and minoxidil   Hypothyroidism: Continue with Synthroid.   Hyperlipidemia: Continue with Crestor.   Protein Caloric Malnutrition; continue with  ensure.  Dysphagia.  [facility] Per family patient eats regular diet.  Evaluated by speech, recommend renal diet.    Left hand weakness, dysphagia.  MRI negative for stroke.  Anemia; monitor hb          Consultants: Cardiology, Palliative  Procedures performed: ECHO Disposition: Home Diet recommendation:  Discharge Diet Orders (From admission, onward)     Start     Ordered   10/31/22 0000  Diet - low sodium heart healthy        10/31/22 0844           Cardiac diet DISCHARGE MEDICATION: Allergies as of 10/31/2022       Reactions   Xyzal [levocetirizine Dihydrochloride] Itching   Augmentin [amoxicillin-pot Clavulanate]  Diarrhea, Nausea And Vomiting   Tribenzor [olmesartan-amlodipine-hctz] Other (See Comments)   "Hurt the kidneys"        Medication List     STOP taking these medications    cloNIDine 0.1 MG tablet Commonly known as: CATAPRES   fluticasone 50 MCG/ACT nasal spray Commonly known as: FLONASE   loperamide 2 MG tablet Commonly known as: IMODIUM  A-D   moxifloxacin 0.5 % ophthalmic solution Commonly known as: VIGAMOX       TAKE these medications    acetaminophen 500 MG tablet Commonly known as: TYLENOL Take 1,000 mg by mouth as needed for moderate pain.   apixaban 2.5 MG Tabs tablet Commonly known as: ELIQUIS Take 1 tablet (2.5 mg total) by mouth 2 (two) times daily.   aspirin EC 81 MG tablet Take 1 tablet (81 mg total) by mouth daily. Swallow whole.   carvedilol 25 MG tablet Commonly known as: COREG TAKE 1 TABLET (25 MG TOTAL) BY MOUTH TWICE A DAY WITH MEALS What changed: See the new instructions.   cilostazol 100 MG tablet Commonly known as: PLETAL TAKE 1 TABLET BY MOUTH TWICE A DAY   cloNIDine 0.1 mg/24hr patch Commonly known as: CATAPRES - Dosed in mg/24 hr Place 1 patch (0.1 mg total) onto the skin once a week.   diphenoxylate-atropine 2.5-0.025 MG tablet Commonly known as: Lomotil Take 1 tablet by mouth daily as needed for diarrhea or loose stools.   DULoxetine 20 MG capsule Commonly known as: CYMBALTA TAKE 1 CAPSULE BY MOUTH EVERY DAY What changed: how much to take   ferrous sulfate 325 (65 FE) MG tablet Take 1 tablet (325 mg total) by mouth daily with breakfast.   furosemide 40 MG tablet Commonly known as: Lasix Take 1 tablet (40 mg total) by mouth daily as needed. For increase edema   guaiFENesin 600 MG 12 hr tablet Commonly known as: MUCINEX Take 2 tablets (1,200 mg total) by mouth 2 (two) times daily.   hydrALAZINE 25 MG tablet Commonly known as: APRESOLINE Take 5 tablets (125 mg total) by mouth every 8 (eight) hours. Hold if SBP less than 120 What changed:  medication strength how much to take when to take this additional instructions   ipratropium-albuterol 0.5-2.5 (3) MG/3ML Soln Commonly known as: DUONEB Take 3 mLs by nebulization 2 (two) times daily.   isosorbide mononitrate 60 MG 24 hr tablet Commonly known as: IMDUR Take 1 tablet (60 mg total) by mouth 2 (two) times  daily.   levothyroxine 50 MCG tablet Commonly known as: SYNTHROID Take 1 tablet (50 mcg total) by mouth daily before breakfast. Additional refill from pcp   LORazepam 1 MG tablet Commonly known as: ATIVAN Take 1 tablet (1 mg total) by mouth 2 (two) times daily as needed for anxiety.   minoxidil 2.5 MG tablet Commonly known as: LONITEN Take 1 tablet (2.5 mg total) by mouth 2 (two) times daily.   mirtazapine 15 MG tablet Commonly known as: REMERON TAKE 1 TABLET BY MOUTH EVERYDAY AT BEDTIME What changed: See the new instructions.   nitroGLYCERIN 0.4 MG SL tablet Commonly known as: NITROSTAT Place 1 tablet (0.4 mg total) under the tongue every 5 (five) minutes x 3 doses as needed for chest pain.   pantoprazole 40 MG tablet Commonly known as: PROTONIX Take 1 tablet (40 mg total) by mouth daily.   rosuvastatin 20 MG tablet Commonly known as: CRESTOR TAKE 1 TABLET (20 MG TOTAL) BY MOUTH DAILY. What changed: when to take this  sodium bicarbonate 650 MG tablet Take 650 mg by mouth 3 (three) times daily.   Vitamin D 50 MCG (2000 UT) Caps Take 2,000 Units by mouth daily.               Durable Medical Equipment  (From admission, onward)           Start     Ordered   10/31/22 0846  For home use only DME Nebulizer machine  Once       Question Answer Comment  Patient needs a nebulizer to treat with the following condition COPD exacerbation ([place])   Length of Need 6 Months      10/31/22 0845            Discharge Exam: Filed Weights   10/29/22 1642 10/30/22 0448 10/31/22 0511  Weight: 52.8 kg 50 kg 52.8 kg   General; NAD  Condition at discharge: poor  The results of significant diagnostics from this hospitalization (including imaging, microbiology, ancillary and laboratory) are listed below for reference.   Imaging Studies: DG CHEST PORT 1 VIEW  Result Date: 10/30/2022 CLINICAL DATA:  76 year old female with history of hypoxemia. EXAM: PORTABLE CHEST 1  VIEW COMPARISON:  Chest x-ray 10/28/2022. FINDINGS: Lung volumes are normal. Diffuse interstitial prominence and peribronchial cuffing, similar to the prior study. No confluent consolidative airspace disease. Small left pleural effusion. No right pleural effusion. No pneumothorax. There is cephalization of the pulmonary vasculature and slight indistinctness of the interstitial markings suggestive of mild pulmonary edema. Mild cardiomegaly. Upper mediastinal contours are within normal limits. Atherosclerotic calcifications are noted in the thoracic aorta. IMPRESSION: 1. The appearance of the chest suggest mild congestive heart failure. 2. New small left pleural effusion. 3. Aortic atherosclerosis. Electronically Signed   By: Vinnie Langton M.D.   On: 10/30/2022 09:14   ECHOCARDIOGRAM COMPLETE  Result Date: 10/29/2022    ECHOCARDIOGRAM REPORT   Patient Name:   IRASEMA CHALK Date of Exam: 10/29/2022 Medical Rec #:  [id number]        Height:       61.0 in Accession #:    [id number]       Weight:       110.0 lb Date of Birth:  October 15, 1946       BSA:          1.465 m Patient Age:    38 years         BP:           143/66 mmHg Patient Gender: F                HR:           70 bpm. Exam Location:  Inpatient Procedure: 2D Echo, Cardiac Doppler and Color Doppler Indications:    Atrial fibrillation  History:        Patient has prior history of Echocardiogram examinations, most                 recent 08/07/2022. COPD and PAD, Arrythmias:Atrial Fibrillation,                 Signs/Symptoms:Chest Pain; Risk Factors:Hypertension. CKD.                 Elevated troponin.  Sonographer:    Clayton Lefort RDCS (AE) Referring Phys: [phone] DEBBY CROSLEY  Sonographer Comments: Unable to perform IVC collapse test due to presence of trach. IMPRESSIONS  1. LVEF is approximately 45% with hypokinesis of the distal inferoseptal;  hypokinesis/akinesis of the distal inferior and apical walls. COmpared to echo from Aug 2023, these findings are new  . Left ventricular ejection fraction, by estimation, is 60 to 65%. The left ventricle has normal function. The left ventricle has no regional wall motion abnormalities. There is moderate left ventricular hypertrophy. Left ventricular diastolic parameters are consistent with Grade I diastolic dysfunction (impaired relaxation). Elevated left atrial pressure.  2. Right ventricular systolic function is normal. The right ventricular size is normal.  3. Left atrial size was moderately dilated.  4. Mild mitral valve regurgitation. Moderate mitral annular calcification.  5. The aortic valve is tricuspid. Aortic valve regurgitation is not visualized. Aortic valve sclerosis is present, with no evidence of aortic valve stenosis.  6. Pulmonic valve regurgitation is moderate.  7. The inferior vena cava is normal in size with greater than 50% respiratory variability, suggesting right atrial pressure of 3 mmHg. FINDINGS  Left Ventricle: LVEF is approximately 45% with hypokinesis of the distal inferoseptal; hypokinesis/akinesis of the distal inferior and apical walls. COmpared to echo from Aug 2023, these findings are new. Left ventricular ejection fraction, by estimation, is 60 to 65%. The left ventricle has normal function. The left ventricle has no regional wall motion abnormalities. The left ventricular internal cavity size was normal in size. There is moderate left ventricular hypertrophy. Left ventricular  diastolic parameters are consistent with Grade I diastolic dysfunction (impaired relaxation). Elevated left atrial pressure. Right Ventricle: The right ventricular size is normal. Right vetricular wall thickness was not assessed. Right ventricular systolic function is normal. Left Atrium: Left atrial size was moderately dilated. Right Atrium: Right atrial size was normal in size. Pericardium: Trivial pericardial effusion is present. Mitral Valve: There is mild thickening of the mitral valve leaflet(s). Moderate mitral  annular calcification. Mild mitral valve regurgitation. Tricuspid Valve: The tricuspid valve is normal in structure. Tricuspid valve regurgitation is mild. Aortic Valve: The aortic valve is tricuspid. Aortic valve regurgitation is not visualized. Aortic valve sclerosis is present, with no evidence of aortic valve stenosis. Aortic valve mean gradient measures 4.0 mmHg. Aortic valve peak gradient measures 6.5  mmHg. Aortic valve area, by VTI measures 2.33 cm. Pulmonic Valve: The pulmonic valve was not well visualized. Pulmonic valve regurgitation is moderate. Aorta: The aortic root and ascending aorta are structurally normal, with no evidence of dilitation. Venous: The inferior vena cava is normal in size with greater than 50% respiratory variability, suggesting right atrial pressure of 3 mmHg. IAS/Shunts: No atrial level shunt detected by color flow Doppler.  LEFT VENTRICLE PLAX 2D LVIDd:         4.30 cm   Diastology LVIDs:         2.90 cm   LV e' medial:    5.44 cm/s LV PW:         1.50 cm   LV E/e' medial:  16.1 LV IVS:        1.40 cm   LV e' lateral:   5.66 cm/s LVOT diam:     2.10 cm   LV E/e' lateral: 15.5 LV SV:         65 LV SV Index:   44 LVOT Area:     3.46 cm  RIGHT VENTRICLE             IVC RV Basal diam:  2.60 cm     IVC diam: 2.20 cm RV S prime:     14.30 cm/s TAPSE (M-mode): 2.4 cm LEFT ATRIUM  Index        RIGHT ATRIUM           Index LA diam:      3.00 cm 2.05 cm/m   RA Area:     12.50 cm LA Vol (A2C): 67.6 ml 46.14 ml/m  RA Volume:   28.80 ml  19.66 ml/m LA Vol (A4C): 55.6 ml 37.95 ml/m  AORTIC VALVE AV Area (Vmax):    2.32 cm AV Area (Vmean):   2.29 cm AV Area (VTI):     2.33 cm AV Vmax:           127.00 cm/s AV Vmean:          87.000 cm/s AV VTI:            0.279 m AV Peak Grad:      6.5 mmHg AV Mean Grad:      4.0 mmHg LVOT Vmax:         84.90 cm/s LVOT Vmean:        57.600 cm/s LVOT VTI:          0.188 m LVOT/AV VTI ratio: 0.67  AORTA Ao Root diam: 3.10 cm Ao Asc diam:  3.30 cm  MITRAL VALVE MV Area (PHT): 2.84 cm     SHUNTS MV Decel Time: 267 msec     Systemic VTI:  0.19 m MV E velocity: 87.80 cm/s   Systemic Diam: 2.10 cm MV A velocity: 126.00 cm/s MV E/A ratio:  0.70 Dorris Carnes MD Electronically signed by Dorris Carnes MD Signature Date/Time: 10/29/2022/4:48:21 PM    Final    MR BRAIN WO CONTRAST  Result Date: 10/29/2022 CLINICAL DATA:  Neuro deficit with acute stroke suspected. History of laryngectomy for cancer EXAM: MRI HEAD WITHOUT CONTRAST TECHNIQUE: Multiplanar, multiecho pulse sequences of the brain and surrounding structures were obtained without intravenous contrast. COMPARISON:  Head CT from earlier today FINDINGS: Brain: No acute infarction, hemorrhage, hydrocephalus, extra-axial collection or mass lesion. Vascular: Normal flow voids. Skull and upper cervical spine: No focal marrow lesion Sinuses/Orbits: Chronic right sphenoid sinusitis with partial opacification by CT. Other: Laryngectomy seen on sagittal T1 weighted images. IMPRESSION: No acute finding.  Negative for infarct. Electronically Signed   By: Jorje Guild M.D.   On: 10/29/2022 04:27   CT HEAD WO CONTRAST  Result Date: 10/28/2022 CLINICAL DATA:  Neuro deficit, acute stroke suspected. EXAM: CT HEAD WITHOUT CONTRAST TECHNIQUE: Contiguous axial images were obtained from the base of the skull through the vertex without intravenous contrast. RADIATION DOSE REDUCTION: This exam was performed according to the departmental dose-optimization program which includes automated exposure control, adjustment of the mA and/or kV according to patient size and/or use of iterative reconstruction technique. COMPARISON:  CT examination dated September 02, 2022. FINDINGS: Brain: No evidence of acute infarction, hemorrhage, hydrocephalus, extra-axial collection or mass lesion/mass effect. Vascular: No hyperdense vessel or unexpected calcification. Skull: Normal. Negative for fracture or focal lesion. Sinuses/Orbits: No acute  finding. Other: None. IMPRESSION: No acute intracranial pathology. Electronically Signed   By: Keane Police D.O.   On: 10/28/2022 23:59   DG Chest Portable 1 View  Result Date: 10/28/2022 CLINICAL DATA:  Shortness of breath. EXAM: PORTABLE CHEST 1 VIEW COMPARISON:  Chest radiograph dated September 08, 2022. FINDINGS: The heart is mildly enlarged. Prominent atherosclerotic calcification of the aortic arch. Biapical pleural/parenchymal scarring. No focal consolidation or pleural effusion. IMPRESSION: Mild cardiomegaly. No focal consolidation or pleural effusion. Electronically Signed   By: Judye Bos.O.  On: 10/28/2022 22:30    Microbiology: Results for orders placed or performed during the hospital encounter of 10/28/22  Resp Panel by RT-PCR (Flu A&B, Covid) Anterior Nasal Swab     Status: None   Collection Time: 10/28/22 11:19 PM   Specimen: Anterior Nasal Swab  Result Value Ref Range Status   SARS Coronavirus 2 by RT PCR NEGATIVE NEGATIVE Final    Comment: (NOTE) SARS-CoV-2 target nucleic acids are NOT DETECTED.  The SARS-CoV-2 RNA is generally detectable in upper respiratory specimens during the acute phase of infection. The lowest concentration of SARS-CoV-2 viral copies this assay can detect is 138 copies/mL. A negative result does not preclude SARS-Cov-2 infection and should not be used as the sole basis for treatment or other patient management decisions. A negative result may occur with  improper specimen collection/handling, submission of specimen other than nasopharyngeal swab, presence of viral mutation(s) within the areas targeted by this assay, and inadequate number of viral copies(<138 copies/mL). A negative result must be combined with clinical observations, patient history, and epidemiological information. The expected result is Negative.  Fact Sheet for Patients:  EntrepreneurPulse.com.au  Fact Sheet for Healthcare Providers:   IncredibleEmployment.be  This test is no t yet approved or cleared by the [place] FDA and  has been authorized for detection and/or diagnosis of SARS-CoV-2 by FDA under an Emergency Use Authorization (EUA). This EUA will remain  in effect (meaning this test can be used) for the duration of the COVID-19 declaration under Section 564(b)(1) of the Act, 21 U.S.C.section 360bbb-3(b)(1), unless the authorization is terminated  or revoked sooner.       Influenza A by PCR NEGATIVE NEGATIVE Final   Influenza B by PCR NEGATIVE NEGATIVE Final    Comment: (NOTE) The Xpert Xpress SARS-CoV-2/FLU/RSV plus assay is intended as an aid in the diagnosis of influenza from Nasopharyngeal swab specimens and should not be used as a sole basis for treatment. Nasal washings and aspirates are unacceptable for Xpert Xpress SARS-CoV-2/FLU/RSV testing.  Fact Sheet for Patients: EntrepreneurPulse.com.au  Fact Sheet for Healthcare Providers: IncredibleEmployment.be  This test is not yet approved or cleared by the [place] FDA and has been authorized for detection and/or diagnosis of SARS-CoV-2 by FDA under an Emergency Use Authorization (EUA). This EUA will remain in effect (meaning this test can be used) for the duration of the COVID-19 declaration under Section 564(b)(1) of the Act, 21 U.S.C. section 360bbb-3(b)(1), unless the authorization is terminated or revoked.  Performed at [facility], [place] [address], [place],  [address]     Labs: CBC: Recent Labs  Lab 10/28/22 2212 10/29/22 0647 10/30/22 0056  WBC 7.9 7.4 6.4  NEUTROABS 5.8  --  4.6  HGB 8.4* 7.7* 7.5*  HCT 26.0* 24.5* 24.1*  MCV 86.1 87.8 88.6  PLT 186 169 466   Basic Metabolic Panel: Recent Labs  Lab 10/28/22 2212 10/29/22 0539 10/29/22 1532 10/29/22 1829 10/30/22 0056  NA 140  --  140 140 140  K 4.3  --  4.4 4.2 4.1  CL 106  --  109 108 106   CO2 20*  --  20* 20* 21*  GLUCOSE 110*  --  105* 109* 102*  BUN 58*  --  57* 55* 56*  CREATININE 3.99*  --  4.13* 4.13* 4.21*  CALCIUM 9.3  --  8.8* 8.9 8.8*  MG  --  1.9  --   --  2.0   Liver Function Tests: Recent Labs  Lab 10/28/22 2212  AST 13*  ALT 12  ALKPHOS 78  BILITOT 0.6  PROT 6.8  ALBUMIN 3.3*   CBG: No results for input(s): "GLUCAP" in the last 168 hours.  Discharge time spent: greater than 30 minutes.  Signed: Elmarie Shiley, MD [facility] 10/31/2022

## 2022-10-31 NOTE — TOC Transition Note (Addendum)
Transition of Care [facility]) - CM/SW Discharge Note   Patient Details  Name: [name] MRN: [id number] Date of Birth: 01-21-1946  Transition of Care [facility]) CM/SW Contact:  Carles Collet, RN Phone Number: 10/31/2022, 9:12 AM   Clinical Narrative:     [name] at [facility], notified of DC to home today.  Daughter to transport home, patient RA. HOP will have start of care tomorrow morning, Tharon Aquas will reach out to patient's daughter today to make plans for time of visit in the morning.  No other TOC needs identified.   Nebulizer ordered through [employer] for ASAP delivery to the room. Nurse notified by secure chat  Final next level of care: Home w Hospice Care Barriers to Discharge: Continued Medical Work up   Patient Goals and CMS Choice Patient states their goals for this hospitalization and ongoing recovery are:: home with hospice CMS Medicare.gov Compare Post Acute Care list provided to:: Patient Represenative (must comment) Choice offered to / list presented to : Adult Children  Discharge Placement                       Discharge Plan and Services In-house Referral: NA Discharge Planning Services: CM Consult Post Acute Care Choice: Hospice            DME Agency: NA       HH Arranged: RN [place] Agency: [place] Date [place]: 10/30/22 Time [place]: 1649 Representative spoke with at [place]: [place] Determinants of Health ([place]) Interventions     Readmission Risk Interventions    10/30/2022    4:48 PM 09/04/2022    4:32 PM 09/04/2022    4:22 PM  Readmission Risk Prevention Plan  Transportation Screening Complete Complete Complete  PCP or Specialist Appt within 3-5 Days  Complete Complete  HRI or [place]  Complete Complete  Social Work Consult for [place] Planning/Counseling  Complete Complete  Palliative Care Screening  Not Applicable   Medication Review Press photographer) Complete Complete  Complete  HRI or [place] Complete    SW Recovery Care/Counseling Consult Complete    Palliative Care Screening Complete    [place] Not Applicable

## 2022-11-01 ENCOUNTER — Other Ambulatory Visit: Payer: Self-pay | Admitting: Internal Medicine

## 2022-11-01 DIAGNOSIS — E785 Hyperlipidemia, unspecified: Secondary | ICD-10-CM

## 2022-11-02 ENCOUNTER — Telehealth: Payer: Self-pay

## 2022-11-02 NOTE — Telephone Encounter (Signed)
Transition Care Management Unsuccessful Follow-up Telephone Call  Date of discharge and from where:  [facility] 10/31/2022  Attempts:  1st Attempt  Reason for unsuccessful TCM follow-up call:  Left voice message   [name], [name] / [facility]  Site: [facility] at [place]: [phone] fax Website: [email or web address] WE ARE [facility]

## 2022-11-03 ENCOUNTER — Encounter: Payer: Self-pay | Admitting: Internal Medicine

## 2022-11-03 ENCOUNTER — Telehealth: Payer: Self-pay

## 2022-11-03 ENCOUNTER — Ambulatory Visit (INDEPENDENT_AMBULATORY_CARE_PROVIDER_SITE_OTHER): Payer: Medicare Other | Admitting: Internal Medicine

## 2022-11-03 VITALS — BP 180/60 | HR 91 | Temp 99.1°F | Ht 62.0 in | Wt 116.0 lb

## 2022-11-03 DIAGNOSIS — J449 Chronic obstructive pulmonary disease, unspecified: Secondary | ICD-10-CM

## 2022-11-03 DIAGNOSIS — I2489 Other forms of acute ischemic heart disease: Secondary | ICD-10-CM

## 2022-11-03 DIAGNOSIS — N185 Chronic kidney disease, stage 5: Secondary | ICD-10-CM

## 2022-11-03 LAB — RENAL FUNCTION PANEL
Albumin: 4 g/dL (ref 3.5–5.2)
BUN: 51 mg/dL — ABNORMAL HIGH (ref 6–23)
CO2: 24 mEq/L (ref 19–32)
Calcium: 9.1 mg/dL (ref 8.4–10.5)
Chloride: 102 mEq/L (ref 96–112)
Creatinine, Ser: 4.7 mg/dL (ref 0.40–1.20)
GFR: 8.52 mL/min — CL (ref >60.00)
Glucose, Bld: 114 mg/dL — ABNORMAL HIGH (ref 70–99)
Phosphorus: 5.3 mg/dL — ABNORMAL HIGH (ref 2.3–4.6)
Potassium: 4.2 mEq/L (ref 3.5–5.1)
Sodium: 139 mEq/L (ref 135–145)

## 2022-11-03 MED ORDER — CYCLOBENZAPRINE HCL 5 MG PO TABS: 2.5000 mg | ORAL_TABLET | Freq: Every evening | ORAL | 1 refills | Status: DC | PRN

## 2022-11-03 NOTE — Assessment & Plan Note (Signed)
Recent NSTEMI and not having chest pains. Encouraged to move around normally avoid heavy exertion for 4-6 weeks.

## 2022-11-03 NOTE — Patient Instructions (Addendum)
We have sent in flexeril to take 1/2 pill to 1 pill at night time.  We will check the labs today.

## 2022-11-03 NOTE — Assessment & Plan Note (Signed)
Checking renal function today. Overall stable around 10 previously. Recent NSTEMI which may impact. They do not want to do dialysis and were frustrated how everyone at the hospital was asking them about this.

## 2022-11-03 NOTE — Telephone Encounter (Signed)
Ok, stable from prior will address in result note

## 2022-11-03 NOTE — Telephone Encounter (Signed)
Lab called to report critical Creatine of 4.70 and GFR of 8.52.

## 2022-11-03 NOTE — Progress Notes (Signed)
   Subjective:   Patient ID: [name], female    DOB: 07/16/1946, 76 y.o.   MRN: [id number]  HPI The patient is a 76 YO female coming in for hospital follow up (NSTEMI medically managed, no cath due to poor renal function wanting to avoid progression to renal failure, BP labile during hospital). They were discharged with hospice resources. Overall she is doing okay but tired. No chest pains. BP is running mostly better at home sometimes up still.   PMH, [facility], social history reviewed and updated  Review of Systems  Constitutional:  Positive for activity change and fatigue.  HENT: Negative.    Eyes: Negative.   Respiratory:  Positive for cough and shortness of breath. Negative for chest tightness.   Cardiovascular:  Negative for chest pain, palpitations and leg swelling.  Gastrointestinal:  Negative for abdominal distention, abdominal pain, constipation, diarrhea, nausea and vomiting.  Musculoskeletal: Negative.   Skin: Negative.   Neurological:  Positive for dizziness, weakness and headaches.  Psychiatric/Behavioral:  Positive for dysphoric mood.     Objective:  Physical Exam Constitutional:      Appearance: She is well-developed. She is ill-appearing.  HENT:     Head: Normocephalic and atraumatic.  Cardiovascular:     Rate and Rhythm: Normal rate and regular rhythm.  Pulmonary:     Effort: Pulmonary effort is normal. No respiratory distress.     Breath sounds: Normal breath sounds. No wheezing or rales.     Comments: Trach in place without signs of infection Abdominal:     General: Bowel sounds are normal. There is no distension.     Palpations: Abdomen is soft.     Tenderness: There is no abdominal tenderness. There is no rebound.  Musculoskeletal:     Cervical back: Normal range of motion.  Skin:    General: Skin is warm and dry.  Neurological:     Mental Status: She is alert and oriented to person, place, and time.     Coordination: Coordination abnormal.      Comments: Slow gait needs some assistance to stand     Vitals:   11/03/22 0900  BP: (!) 180/60  Pulse: 91  Temp: 99.1 F (37.3 C)  TempSrc: Oral  SpO2: 98%  Weight: 116 lb (52.6 kg)  Height: '5\' 2"'$  (1.575 m)    Assessment & Plan:  Visit time 25 minutes in face to face communication with patient and coordination of care, additional 15 minutes spent in record review, coordination or care, ordering tests, communicating/referring to other healthcare professionals, documenting in medical records all on the same day of the visit for total time 40 minutes spent on the visit.

## 2022-11-03 NOTE — Assessment & Plan Note (Signed)
No flare today.

## 2022-11-05 ENCOUNTER — Other Ambulatory Visit: Payer: Self-pay | Admitting: Internal Medicine

## 2022-11-08 ENCOUNTER — Other Ambulatory Visit: Payer: Self-pay | Admitting: Internal Medicine

## 2022-11-10 ENCOUNTER — Telehealth: Payer: Self-pay

## 2022-11-10 ENCOUNTER — Ambulatory Visit: Payer: Medicare Other | Attending: Audiologist | Admitting: Audiologist

## 2022-11-10 NOTE — Telephone Encounter (Signed)
Spoke with pts daughter and she has stated the pt has concerns of losing her eye sight as she has stated the pt been complaining of her eye sight is getting more and more blurry.  Pts daughter asked if a stat referral or a referral can be sent in to an eye doctor to help find out what is causing the pts eye sight to be less and less and more and more blurry? Or if she can bee seen in office by provider to have imaging done?

## 2022-11-11 NOTE — Telephone Encounter (Signed)
Does she have a current eye doctor. If so call them to see her ASAP. Otherwise needs visit ASAP to assess in clinic.

## 2022-11-17 ENCOUNTER — Telehealth: Payer: Self-pay

## 2022-11-17 ENCOUNTER — Encounter: Payer: Self-pay | Admitting: Internal Medicine

## 2022-11-17 ENCOUNTER — Ambulatory Visit (INDEPENDENT_AMBULATORY_CARE_PROVIDER_SITE_OTHER): Payer: Medicare Other | Admitting: Internal Medicine

## 2022-11-17 VITALS — BP 220/95 | HR 74 | Temp 98.3°F

## 2022-11-17 DIAGNOSIS — F331 Major depressive disorder, recurrent, moderate: Secondary | ICD-10-CM | POA: Diagnosis not present

## 2022-11-17 DIAGNOSIS — Z4589 Encounter for adjustment and management of other implanted devices: Secondary | ICD-10-CM | POA: Diagnosis not present

## 2022-11-17 DIAGNOSIS — H5712 Ocular pain, left eye: Secondary | ICD-10-CM | POA: Insufficient documentation

## 2022-11-17 DIAGNOSIS — I10 Essential (primary) hypertension: Secondary | ICD-10-CM

## 2022-11-17 DIAGNOSIS — E039 Hypothyroidism, unspecified: Secondary | ICD-10-CM

## 2022-11-17 DIAGNOSIS — I5033 Acute on chronic diastolic (congestive) heart failure: Secondary | ICD-10-CM | POA: Diagnosis not present

## 2022-11-17 DIAGNOSIS — I2489 Other forms of acute ischemic heart disease: Secondary | ICD-10-CM | POA: Diagnosis not present

## 2022-11-17 DIAGNOSIS — Z9889 Other specified postprocedural states: Secondary | ICD-10-CM | POA: Diagnosis not present

## 2022-11-17 DIAGNOSIS — N185 Chronic kidney disease, stage 5: Secondary | ICD-10-CM | POA: Diagnosis not present

## 2022-11-17 DIAGNOSIS — H547 Unspecified visual loss: Secondary | ICD-10-CM | POA: Insufficient documentation

## 2022-11-17 DIAGNOSIS — R490 Dysphonia: Secondary | ICD-10-CM | POA: Diagnosis not present

## 2022-11-17 DIAGNOSIS — D631 Anemia in chronic kidney disease: Secondary | ICD-10-CM | POA: Diagnosis not present

## 2022-11-17 LAB — RENAL FUNCTION PANEL
Albumin: 3.9 g/dL (ref 3.5–5.2)
BUN: 54 mg/dL — ABNORMAL HIGH (ref 6–23)
CO2: 24 mEq/L (ref 19–32)
Calcium: 9.2 mg/dL (ref 8.4–10.5)
Chloride: 101 mEq/L (ref 96–112)
Creatinine, Ser: 4.28 mg/dL — ABNORMAL HIGH (ref 0.40–1.20)
GFR: 9.53 mL/min — CL (ref >60.00)
Glucose, Bld: 149 mg/dL — ABNORMAL HIGH (ref 70–99)
Phosphorus: 4.1 mg/dL (ref 2.3–4.6)
Potassium: 4.2 mEq/L (ref 3.5–5.1)
Sodium: 139 mEq/L (ref 135–145)

## 2022-11-17 LAB — CBC
HCT: 23.5 % — CL (ref 36.0–46.0)
Hemoglobin: 8 g/dL — CL (ref 12.0–15.0)
MCHC: 34 g/dL (ref 30.0–36.0)
MCV: 84.9 fl (ref 78.0–100.0)
Platelets: 176 10*3/uL (ref 150.0–400.0)
RBC: 2.76 Mil/uL — ABNORMAL LOW (ref 3.87–5.11)
RDW: 15.2 % (ref 11.5–15.5)
WBC: 6.5 10*3/uL (ref 4.0–10.5)

## 2022-11-17 LAB — TSH: TSH: 5.12 u[IU]/mL (ref 0.35–5.50)

## 2022-11-17 LAB — FERRITIN: Ferritin: 841 ng/mL — ABNORMAL HIGH (ref 10.0–291.0)

## 2022-11-17 LAB — T4, FREE: Free T4: 0.91 ng/dL (ref 0.60–1.60)

## 2022-11-17 MED ORDER — HYDRALAZINE HCL 100 MG PO TABS: ORAL_TABLET | ORAL | 11 refills | Status: DC

## 2022-11-17 MED ORDER — DULOXETINE HCL 30 MG PO CPEP: 30.0000 mg | ORAL_CAPSULE | Freq: Every day | ORAL | 3 refills | Status: DC

## 2022-11-17 MED ORDER — HYDRALAZINE HCL 25 MG PO TABS: ORAL_TABLET | ORAL | 11 refills | Status: DC

## 2022-11-17 NOTE — Assessment & Plan Note (Signed)
With all these major health problems she is suffering more. Increase cymbalta to 30 mg daily and see how this helps.

## 2022-11-17 NOTE — Assessment & Plan Note (Signed)
Checking TSH and free T4 as she has had dosage change about 4-5 weeks ago. Adjust synthroid 50 mcg daily as needed. It is unclear if this is related to her lower eyelid swelling or if her severe CKD is causing.

## 2022-11-17 NOTE — Assessment & Plan Note (Signed)
Getting dilation later this week. Advised if BP is 200 she would not be able to get this done. She is advised to take all BP meds on time for next several days and make sure to take morning of procedure. She is moderate risk for sedation in setting of recent NSTEMI.

## 2022-11-17 NOTE — Assessment & Plan Note (Addendum)
BP is severely elevated due to BP meds late this morning and drinking coffee prior to visit. They are asked to monitor at home today and let us know if this does not come down. Reminded again how the blood pressure going up and down is not good for the kidneys and will hasten her renal failure. We will keep regimen the same for now since it is effective when taking regularly including coreg 25 mg BID and clonidine 0.1 mg daily patch and lasix (increasing to 80 mg BID) and hydralazine 125 mg TID (new rx done to simplify regimen) and imdur 60 mg daily and minoxidil 2.5 mg BID.

## 2022-11-17 NOTE — Assessment & Plan Note (Signed)
Checking CBC, ferritin and adjust as needed. Cannot tolerate oral iron. Had iron infusion earlier this year.

## 2022-11-17 NOTE — Progress Notes (Signed)
   Subjective:   Patient ID: [name], female    DOB: 1946-10-20, 76 y.o.   MRN: [id number]  HPI The patient is a 76 YO female coming in for several concerns including vision change, eyelid swelling, leg swelling.  Review of Systems  Constitutional:  Positive for activity change and appetite change.  Eyes:  Positive for pain, discharge and visual disturbance.  Respiratory:  Positive for cough and shortness of breath. Negative for chest tightness.   Cardiovascular:  Positive for leg swelling. Negative for chest pain and palpitations.  Gastrointestinal:  Negative for abdominal distention, abdominal pain, constipation, diarrhea, nausea and vomiting.  Musculoskeletal:  Positive for arthralgias and myalgias.  Skin: Negative.   Neurological: Negative.   Psychiatric/Behavioral:  Positive for decreased concentration and dysphoric mood.     Objective:  Physical Exam Constitutional:      Appearance: She is well-developed. She is ill-appearing.  HENT:     Head: Normocephalic and atraumatic.  Eyes:     Comments: Lower eyelids with swelling  Cardiovascular:     Rate and Rhythm: Normal rate and regular rhythm.  Pulmonary:     Effort: Pulmonary effort is normal. No respiratory distress.     Breath sounds: Normal breath sounds. No wheezing or rales.     Comments: Trach with secretions which she is struggling to clear during visit Abdominal:     General: Bowel sounds are normal. There is no distension.     Palpations: Abdomen is soft.     Tenderness: There is no abdominal tenderness. There is no rebound.  Musculoskeletal:        General: Tenderness present.     Cervical back: Normal range of motion.     Right lower leg: Edema present.     Left lower leg: Edema present.  Skin:    General: Skin is warm and dry.  Neurological:     Mental Status: She is alert and oriented to person, place, and time.     Coordination: Coordination abnormal.     Vitals:   11/17/22 1002 11/17/22  1005  BP: (!) 220/100 (!) 220/95  Pulse: 74   Temp: 98.3 F (36.8 C)   TempSrc: Oral   SpO2: 98%     Assessment & Plan:  Visit time 25 minutes in face to face communication with patient and coordination of care, additional 15 minutes spent in record review, coordination or care, ordering tests, communicating/referring to other healthcare professionals, documenting in medical records all on the same day of the visit for total time 40 minutes spent on the visit.

## 2022-11-17 NOTE — Patient Instructions (Addendum)
We will get you in with the eye doctor as quick as possible.  It is okay to double the fluid pill up to 4 pills a day to help with the fluid.  We will increase cymbalta to 30 mg daily to see if this helps more.  Try saline or lubricating eye drops in the eyes 3 times a day to see if this helps the blurry vision.

## 2022-11-17 NOTE — Assessment & Plan Note (Signed)
Checking renal function since checking other labs. She is CKD 5 and with some increase in swelling on exam. We are adjusting the dose of the lasix to 80 mg BID.

## 2022-11-17 NOTE — Telephone Encounter (Signed)
Stable from prior will address in result note

## 2022-11-17 NOTE — Assessment & Plan Note (Signed)
Suspect retinopathy left eye given eye exam. She does have labile BP readings and referral urgently made to eye specialist.

## 2022-11-17 NOTE — Assessment & Plan Note (Signed)
She is having chronic blurring vision and tearing eyes bilaterally. Suspect dry eyes and asked her to use saline or lubricating eye drops TID and referral to eye specialist to assess. She is very high risk given her labile BP and could have retinopathy or ischemic of optic nerve.

## 2022-11-17 NOTE — Assessment & Plan Note (Signed)
With increasing swelling in the legs. Given renal function she will likely require higher doses of lasix. Advised them to increase lasix to 80 mg BID and if no diuresis let us know. Legs with 1-2+ edema today.

## 2022-11-18 NOTE — Telephone Encounter (Signed)
Sent over critical labs for patient

## 2022-11-23 DIAGNOSIS — H18593 Other hereditary corneal dystrophies, bilateral: Secondary | ICD-10-CM | POA: Diagnosis not present

## 2022-11-23 DIAGNOSIS — H04123 Dry eye syndrome of bilateral lacrimal glands: Secondary | ICD-10-CM | POA: Diagnosis not present

## 2022-11-23 DIAGNOSIS — H01021 Squamous blepharitis right upper eyelid: Secondary | ICD-10-CM | POA: Diagnosis not present

## 2022-11-23 DIAGNOSIS — H01024 Squamous blepharitis left upper eyelid: Secondary | ICD-10-CM | POA: Diagnosis not present

## 2022-12-03 ENCOUNTER — Telehealth: Payer: Self-pay | Admitting: Internal Medicine

## 2022-12-03 ENCOUNTER — Ambulatory Visit: Payer: Medicare Other | Admitting: Internal Medicine

## 2022-12-03 NOTE — Telephone Encounter (Signed)
Patient called she is trying to get her flexeril refilled - she was taking 1/2 of a tablet and Doctor Sharlet Salina told her to switch to a whole tablet.  She can not get it refilled because pharmacy is telling her that it is to early.  A new rx with the new dosage needs to be sent in.  Please send to [facility] on [address]  Patient's number:  [phone]

## 2022-12-04 ENCOUNTER — Encounter: Payer: Self-pay | Admitting: Nurse Practitioner

## 2022-12-04 MED ORDER — ACETAMINOPHEN-CODEINE 300-30 MG PO TABS: 1.0000 | ORAL_TABLET | Freq: Four times a day (QID) | ORAL | 0 refills | Status: DC | PRN

## 2022-12-04 NOTE — Progress Notes (Signed)
This encounter was created in error - please disregard.

## 2022-12-04 NOTE — Telephone Encounter (Signed)
Advised patient on medication for patient

## 2022-12-04 NOTE — Telephone Encounter (Signed)
After consultation with Dr. Ronnald Ramp I have decided to recommend against refill of cyclobenzaprine at this time.  Please call this patient and notify her that severe chronic kidney disease can result in medications building up in patient's blood which can result in increased risk for negative side effects, some of which can be severe.  With cyclobenzaprine significant sedation may occur and I am concerned about the safety of her taking this medication regarding her kidney function.  Thus, I will not be able to refill the medication.  I will leave this message for Dr. Sharlet Salina to review upon return from her vacation as well.

## 2022-12-04 NOTE — Telephone Encounter (Signed)
This is near hospice Pt with reported severe pain, I agree that flexeril can cause sedation and falls -   Ok for limited Tylenol #3 - done erx

## 2022-12-04 NOTE — Addendum Note (Signed)
Addended by: Biagio Borg on: 12/04/2022 12:23 PM   Modules accepted: Orders

## 2022-12-15 ENCOUNTER — Telehealth: Payer: Self-pay | Admitting: Internal Medicine

## 2022-12-15 NOTE — Telephone Encounter (Signed)
Patients daughter in law Janett Billow called and said that patient said its hard to breathe and she has a lot of mucus build up, Patient said it because of Stoma. Also complained about BP being low said it was 135/65 today and yesterday it was 100/55. I transferred to triage for further assistance

## 2022-12-15 NOTE — Telephone Encounter (Signed)
Was made aware of this morning

## 2022-12-21 ENCOUNTER — Ambulatory Visit: Payer: Medicare Other | Admitting: Internal Medicine

## 2022-12-29 ENCOUNTER — Other Ambulatory Visit: Payer: Self-pay | Admitting: Internal Medicine

## 2022-12-30 ENCOUNTER — Encounter: Payer: Self-pay | Admitting: Internal Medicine

## 2022-12-30 ENCOUNTER — Ambulatory Visit (INDEPENDENT_AMBULATORY_CARE_PROVIDER_SITE_OTHER): Payer: Medicare Other | Admitting: Internal Medicine

## 2022-12-30 VITALS — BP 170/86 | HR 86 | Temp 98.3°F | Ht 62.0 in | Wt 114.0 lb

## 2022-12-30 DIAGNOSIS — N185 Chronic kidney disease, stage 5: Secondary | ICD-10-CM

## 2022-12-30 DIAGNOSIS — J398 Other specified diseases of upper respiratory tract: Secondary | ICD-10-CM

## 2022-12-30 DIAGNOSIS — G894 Chronic pain syndrome: Secondary | ICD-10-CM | POA: Diagnosis not present

## 2022-12-30 DIAGNOSIS — I480 Paroxysmal atrial fibrillation: Secondary | ICD-10-CM | POA: Diagnosis not present

## 2022-12-30 DIAGNOSIS — R220 Localized swelling, mass and lump, head: Secondary | ICD-10-CM

## 2022-12-30 DIAGNOSIS — J441 Chronic obstructive pulmonary disease with (acute) exacerbation: Secondary | ICD-10-CM | POA: Diagnosis not present

## 2022-12-30 DIAGNOSIS — I10 Essential (primary) hypertension: Secondary | ICD-10-CM

## 2022-12-30 DIAGNOSIS — Z9002 Acquired absence of larynx: Secondary | ICD-10-CM

## 2022-12-30 MED ORDER — CYCLOBENZAPRINE HCL 5 MG PO TABS: 5.0000 mg | ORAL_TABLET | Freq: Every evening | ORAL | 1 refills | Status: DC | PRN

## 2022-12-30 MED ORDER — ACETAMINOPHEN-CODEINE 300-30 MG PO TABS: 1.0000 | ORAL_TABLET | Freq: Four times a day (QID) | ORAL | 0 refills | Status: DC | PRN

## 2022-12-30 MED ORDER — CEFPODOXIME PROXETIL 200 MG PO TABS: 200.0000 mg | ORAL_TABLET | Freq: Every day | ORAL | 0 refills | Status: DC

## 2022-12-30 MED ORDER — DIPHENOXYLATE-ATROPINE 2.5-0.025 MG PO TABS: 1.0000 | ORAL_TABLET | Freq: Every day | ORAL | 0 refills | Status: DC | PRN

## 2022-12-30 NOTE — Patient Instructions (Addendum)
We have sent in vantin which is the antibiotic to take 1 pill daily.   The EKG looks better than before so it is okay to proceed with the surgery on the stoma.  For carvedilol we will have you take 1 pill in the morning and 1.5 pills at night time.

## 2022-12-30 NOTE — Progress Notes (Signed)
   Subjective:   Patient ID: [name], female    DOB: 06/11/1946, 77 y.o.   MRN: [id number]  HPI The patient is a 77 YO female coming in for surgical clearance. She was going to get procedure to stretch stoma and then made that team aware of her NSTEMI Nov 2023 and they were concerned about doing procedure. She is very concerned about the narrowing and problems with breathing and secretions and is wanting to get this done if possible. No chest pains. Some palpitations over the weekend. BP is variable but has been running 140s lately. Is taking medications as prescribed. Daughter in law is present and helps to interpret and provide history given the quality of voice with trach.   Review of Systems  Constitutional:  Positive for activity change and fatigue.  HENT: Negative.    Eyes: Negative.   Respiratory:  Positive for cough. Negative for chest tightness and shortness of breath.   Cardiovascular:  Positive for palpitations. Negative for chest pain and leg swelling.  Gastrointestinal:  Positive for diarrhea. Negative for abdominal distention, abdominal pain, constipation, nausea and vomiting.  Musculoskeletal:  Positive for arthralgias and myalgias.  Skin: Negative.   Neurological: Negative.   Psychiatric/Behavioral: Negative.      Objective:  Physical Exam Constitutional:      Appearance: She is well-developed. She is ill-appearing.     Comments: Chronically ill appearing  HENT:     Head: Normocephalic and atraumatic.     Comments: With swelling in the lower eyelids stable from prior, no cellulitis Cardiovascular:     Rate and Rhythm: Normal rate and regular rhythm.  Pulmonary:     Effort: Pulmonary effort is normal. No respiratory distress.     Breath sounds: Normal breath sounds. No wheezing or rales.     Comments: Trach in place and some coughing during visit, voice quality is poor and low volume Abdominal:     General: Bowel sounds are normal. There is no distension.      Palpations: Abdomen is soft.     Tenderness: There is no abdominal tenderness. There is no rebound.  Musculoskeletal:        General: Tenderness present.     Cervical back: Normal range of motion.  Skin:    General: Skin is warm and dry.  Neurological:     Mental Status: She is alert and oriented to person, place, and time.     Coordination: Coordination abnormal.     Vitals:   12/30/22 1020  BP: (!) 170/86  Pulse: 86  Temp: 98.3 F (36.8 C)  TempSrc: Oral  SpO2: 90%  Weight: 114 lb (51.7 kg)  Height: '5\' 2"'$  (1.575 m)   EKG: Rate 77, axis normal, interval normal, QT prolongation has resolved from prior, left atrial enlargement, sinus, prior septal infarct st or t wave changes, ST changes have resolved some since prior 10/2022 and QT prolongation is resolved from prior  Assessment & Plan:  Visit time 35 minutes in face to face communication with patient and coordination of care, additional 10 minutes spent in record review, coordination or care, ordering tests, communicating/referring to other healthcare professionals, documenting in medical records all on the same day of the visit for total time 45 minutes spent on the visit.

## 2022-12-31 ENCOUNTER — Encounter: Payer: Self-pay | Admitting: Internal Medicine

## 2022-12-31 NOTE — Assessment & Plan Note (Signed)
We discussed need for procedure and her overall risk of procedure. They need general anaesthesia for this and she is high risk for this. Given the urgency I feel she is able to proceed but is high risk for heart attack/stroke around time of procedure. Another possible complication would be A fib/flutter or worsening renal function. Patient and daughter in law understand these risks and wish to proceed. Will notify her surgical team. Would recommend not to stop anti-platelet/NOAC around time of procedure if able to help lower risk for complication.

## 2022-12-31 NOTE — Assessment & Plan Note (Signed)
She is doing well without sedation with flexeril 5 mg qhs and refill done. Rx tylenol #3 to help with pain that is not controlled during the day.

## 2022-12-31 NOTE — Assessment & Plan Note (Signed)
With higher volume and thicker secretions. Rx vantin 200 mg daily for 1 week (with GFR this is appropriate dosing).

## 2022-12-31 NOTE — Assessment & Plan Note (Signed)
She is having some palpitations over the weekend. EKG done and not in A fib today. Does not exclude. She is on NOAC and rate controlled with coreg. We will increase coreg to 25 mg in the morning and 37.5 mg (1.5 pills) in the evening to see if we can eliminate her night time palpitations.

## 2022-12-31 NOTE — Assessment & Plan Note (Signed)
She is needing stoma stretching and treatment. We did discuss today with patient and daughter in law she is high risk for anaesthesia with high risk for heart attack or stroke around time of procedure. She had an NSTEMI in Nov 2023. EKG is stable/mildly improved today compared to during NSTEMI.

## 2022-12-31 NOTE — Assessment & Plan Note (Signed)
Had extensive educational session today with patient and daughter in law. Someone at recent hospital stay told her that she had 1 good kidney and so now she is in denial about the state of her kidneys. We talked about how her kidneys are failing and she has around 8 GFR currently which has been stable 1-2 months. She is advised to avoid dehydration, contrast dye, excessive sodium or fluids, high BP. She is taking medications as prescribed but BP is labile. She does not desire dialysis. Daughter in law understanding about renal function after our discussion but patient has some denial and stating that her kidneys are fine. Will continue this discussion ongoing.

## 2022-12-31 NOTE — Assessment & Plan Note (Signed)
Suspect due to poor renal function as adjustment of thyroid medication has not improved swelling. Daughter in law and patient understand this is likely cause but patient is not in agreement as she is struggling with the reality of her renal disease.

## 2022-12-31 NOTE — Assessment & Plan Note (Signed)
She does have severe hypertension and stage 5 CKD not on dialysis. She is taking clonidine 0.1 mg daily pathc, lasix 40 mg daily prn and hydralazine 125 mg TID and coreg. We will increase the dose of coreg to 25 mg q am and 37.5 mg q pm to help with breakthrough palpitations and still labile BP readings. Her BP is elevated today and counseled about risk of progression of renal disease with high BP readings. Encouraged importance of taking all meds at same time of day every day.

## 2023-01-14 ENCOUNTER — Other Ambulatory Visit: Payer: Self-pay | Admitting: Internal Medicine

## 2023-01-16 ENCOUNTER — Other Ambulatory Visit: Payer: Self-pay | Admitting: Internal Medicine

## 2023-01-16 DIAGNOSIS — I739 Peripheral vascular disease, unspecified: Secondary | ICD-10-CM

## 2023-01-17 ENCOUNTER — Other Ambulatory Visit: Payer: Self-pay | Admitting: Internal Medicine

## 2023-01-27 ENCOUNTER — Other Ambulatory Visit: Payer: Self-pay | Admitting: Nurse Practitioner

## 2023-01-27 ENCOUNTER — Other Ambulatory Visit: Payer: Self-pay | Admitting: Internal Medicine

## 2023-01-27 DIAGNOSIS — H1032 Unspecified acute conjunctivitis, left eye: Secondary | ICD-10-CM

## 2023-01-28 ENCOUNTER — Telehealth: Payer: Self-pay | Admitting: Internal Medicine

## 2023-01-28 MED ORDER — CEFPODOXIME PROXETIL 200 MG PO TABS: 200.0000 mg | ORAL_TABLET | Freq: Every day | ORAL | 0 refills | Status: DC

## 2023-01-28 NOTE — Telephone Encounter (Signed)
Patient's daughter-in-law called and said the patient has green mucus and requested an antibiotic. She has no fever, but she said it's something she's dealt with before. She was advised that patient may need an appointment, but did not want to schedule one unless needed. Daughter-in-law's name is Janett Billow and would like a call back at [phone].

## 2023-01-28 NOTE — Telephone Encounter (Signed)
Called patient and let her know that she may pick up the prescription for the patient

## 2023-01-28 NOTE — Telephone Encounter (Signed)
Sent in

## 2023-02-11 DIAGNOSIS — Z9002 Acquired absence of larynx: Secondary | ICD-10-CM | POA: Diagnosis not present

## 2023-02-11 DIAGNOSIS — Z87891 Personal history of nicotine dependence: Secondary | ICD-10-CM | POA: Diagnosis not present

## 2023-02-11 DIAGNOSIS — Z963 Presence of artificial larynx: Secondary | ICD-10-CM | POA: Diagnosis not present

## 2023-02-11 DIAGNOSIS — J386 Stenosis of larynx: Secondary | ICD-10-CM | POA: Diagnosis not present

## 2023-02-11 DIAGNOSIS — J398 Other specified diseases of upper respiratory tract: Secondary | ICD-10-CM | POA: Diagnosis not present

## 2023-02-11 DIAGNOSIS — Z8521 Personal history of malignant neoplasm of larynx: Secondary | ICD-10-CM | POA: Diagnosis not present

## 2023-02-11 DIAGNOSIS — R1310 Dysphagia, unspecified: Secondary | ICD-10-CM | POA: Diagnosis not present

## 2023-02-11 DIAGNOSIS — J9503 Malfunction of tracheostomy stoma: Secondary | ICD-10-CM | POA: Diagnosis not present

## 2023-02-11 DIAGNOSIS — E1310 Other specified diabetes mellitus with ketoacidosis without coma: Secondary | ICD-10-CM | POA: Diagnosis not present

## 2023-02-18 ENCOUNTER — Other Ambulatory Visit: Payer: Self-pay | Admitting: Internal Medicine

## 2023-02-18 DIAGNOSIS — E785 Hyperlipidemia, unspecified: Secondary | ICD-10-CM

## 2023-03-01 ENCOUNTER — Other Ambulatory Visit: Payer: Self-pay | Admitting: Internal Medicine

## 2023-03-05 ENCOUNTER — Ambulatory Visit: Payer: Medicare Other | Admitting: Internal Medicine

## 2023-03-10 ENCOUNTER — Telehealth: Payer: Self-pay

## 2023-03-10 ENCOUNTER — Ambulatory Visit (INDEPENDENT_AMBULATORY_CARE_PROVIDER_SITE_OTHER): Payer: Medicare Other | Admitting: Internal Medicine

## 2023-03-10 ENCOUNTER — Encounter: Payer: Self-pay | Admitting: Internal Medicine

## 2023-03-10 VITALS — BP 140/52 | HR 83 | Temp 98.5°F | Ht 62.0 in | Wt 114.0 lb

## 2023-03-10 DIAGNOSIS — K222 Esophageal obstruction: Secondary | ICD-10-CM

## 2023-03-10 DIAGNOSIS — E440 Moderate protein-calorie malnutrition: Secondary | ICD-10-CM

## 2023-03-10 DIAGNOSIS — R1314 Dysphagia, pharyngoesophageal phase: Secondary | ICD-10-CM

## 2023-03-10 DIAGNOSIS — N185 Chronic kidney disease, stage 5: Secondary | ICD-10-CM | POA: Diagnosis not present

## 2023-03-10 DIAGNOSIS — I5032 Chronic diastolic (congestive) heart failure: Secondary | ICD-10-CM

## 2023-03-10 DIAGNOSIS — R220 Localized swelling, mass and lump, head: Secondary | ICD-10-CM | POA: Diagnosis not present

## 2023-03-10 DIAGNOSIS — D631 Anemia in chronic kidney disease: Secondary | ICD-10-CM | POA: Diagnosis not present

## 2023-03-10 LAB — CBC
HCT: 20.5 % — CL (ref 36.0–46.0)
Hemoglobin: 6.9 g/dL — CL (ref 12.0–15.0)
MCHC: 33.8 g/dL (ref 30.0–36.0)
MCV: 83.6 fl (ref 78.0–100.0)
Platelets: 233 10*3/uL (ref 150.0–400.0)
RBC: 2.45 Mil/uL — ABNORMAL LOW (ref 3.87–5.11)
RDW: 14.2 % (ref 11.5–15.5)
WBC: 6.5 10*3/uL (ref 4.0–10.5)

## 2023-03-10 LAB — HEPATIC FUNCTION PANEL
ALT: 6 U/L (ref 0–35)
AST: 13 U/L (ref 0–37)
Albumin: 3.9 g/dL (ref 3.5–5.2)
Alkaline Phosphatase: 67 U/L (ref 39–117)
Bilirubin, Direct: 0.1 mg/dL (ref 0.0–0.3)
Total Bilirubin: 0.4 mg/dL (ref 0.2–1.2)
Total Protein: 6.8 g/dL (ref 6.0–8.3)

## 2023-03-10 LAB — RENAL FUNCTION PANEL
Albumin: 3.9 g/dL (ref 3.5–5.2)
BUN: 55 mg/dL — ABNORMAL HIGH (ref 6–23)
CO2: 25 mEq/L (ref 19–32)
Calcium: 9 mg/dL (ref 8.4–10.5)
Chloride: 101 mEq/L (ref 96–112)
Creatinine, Ser: 4.63 mg/dL (ref 0.40–1.20)
GFR: 8.65 mL/min — CL (ref >60.00)
Glucose, Bld: 150 mg/dL — ABNORMAL HIGH (ref 70–99)
Phosphorus: 4.4 mg/dL (ref 2.3–4.6)
Potassium: 4.2 mEq/L (ref 3.5–5.1)
Sodium: 139 mEq/L (ref 135–145)

## 2023-03-10 MED ORDER — FUROSEMIDE 40 MG PO TABS: 80.0000 mg | ORAL_TABLET | Freq: Two times a day (BID) | ORAL | 3 refills | Status: DC

## 2023-03-10 MED ORDER — ONDANSETRON HCL 40 MG/20ML IJ SOLN: INTRAMUSCULAR | 3 refills | Status: DC

## 2023-03-10 MED ORDER — HYDRALAZINE HCL 25 MG PO TABS: ORAL_TABLET | ORAL | 11 refills | Status: DC

## 2023-03-10 MED ORDER — OMEPRAZOLE 40 MG PO CPDR: 40.0000 mg | DELAYED_RELEASE_CAPSULE | Freq: Every day | ORAL | 3 refills | Status: DC

## 2023-03-10 MED ORDER — HYDRALAZINE HCL 100 MG PO TABS: ORAL_TABLET | ORAL | 11 refills | Status: DC

## 2023-03-10 MED ORDER — "BD INTEGRA SYRINGE 23G X 1"" 3 ML MISC": 3 refills | Status: DC

## 2023-03-10 NOTE — Progress Notes (Unsigned)
   Subjective:   Patient ID: [name], female    DOB: Jan 05, 1946, 77 y.o.   MRN: [id number]  HPI The patient is a 77 YO female coming in for follow up and chronic concerns. Recent procedure on her stoma and initially was feeling better now about the same as before procedure. There was a decent amount of bleeding after the procedure. She is feeling weak about normal. Some SOB and hard to tell if normal or worse than normal.   Review of Systems  Constitutional:  Positive for appetite change and unexpected weight change.  HENT: Negative.    Eyes: Negative.   Respiratory:  Negative for cough, chest tightness and shortness of breath.   Cardiovascular:  Positive for leg swelling. Negative for chest pain and palpitations.  Gastrointestinal:  Positive for nausea and vomiting. Negative for abdominal distention, abdominal pain, constipation and diarrhea.  Musculoskeletal: Negative.   Skin: Negative.   Neurological:  Positive for weakness.  Psychiatric/Behavioral: Negative.      Objective:  Physical Exam Constitutional:      Appearance: She is well-developed. She is ill-appearing.  HENT:     Head: Normocephalic and atraumatic.     Comments: Edema around the eyelids upper and lower bilaterally stable from prior Cardiovascular:     Rate and Rhythm: Normal rate and regular rhythm.  Pulmonary:     Effort: Pulmonary effort is normal. No respiratory distress.     Breath sounds: Normal breath sounds. No wheezing or rales.     Comments: Trach in place no secretions noted, cough very thick sounding on exam Abdominal:     General: Bowel sounds are normal. There is no distension.     Palpations: Abdomen is soft.     Tenderness: There is no abdominal tenderness. There is no rebound.  Musculoskeletal:     Cervical back: Normal range of motion.  Skin:    General: Skin is warm and dry.  Neurological:     Mental Status: She is alert and oriented to person, place, and time.     Coordination:  Coordination normal.     Vitals:   03/10/23 0919 03/10/23 0935  BP: (!) 140/52 (!) 140/52  Pulse: 83   Temp: 98.5 F (36.9 C)   TempSrc: Oral   SpO2: 99%   Weight: 114 lb (51.7 kg)   Height: 5\' 2"  (1.575 m)     Assessment & Plan:

## 2023-03-10 NOTE — Telephone Encounter (Signed)
Critical call from lab (contact: Shari Prows.) on patient Hbg: 6.9 and HCT 20.5. Provider made aware

## 2023-03-10 NOTE — Telephone Encounter (Signed)
Patient has stage V chronic kidney disease-levels are stable.  Nothing to be done at this time.

## 2023-03-11 NOTE — Telephone Encounter (Signed)
See result note please 

## 2023-03-11 NOTE — Assessment & Plan Note (Signed)
Struggling with oral intake and weight has been declining slightly. Is stable from January 2024 today.

## 2023-03-11 NOTE — Assessment & Plan Note (Signed)
Checking renal function and hepatic function. She does continue to deny her significance of her renal function. She does not have a desire to do dialysis. Have talked again with daughter in law who has better understanding of the reality that her kidneys are in failure and could continue to decline and will eventually lead to death. They understand the importance of taking medications on time to avoid severe hypertension.

## 2023-03-11 NOTE — Assessment & Plan Note (Signed)
No flare today but needs more flexibility in lasix dosing. She is currently taking lasix 40 mg BID on a normal day and up to 80 mg BID on an extra fluid day. Given poor renal function it is likely she will begin needing higher doses to produce any diuresis. New rx done with appropriate sig.

## 2023-03-11 NOTE — Assessment & Plan Note (Signed)
Overall stable and change in thyroid dosing has not impacted. Suspect coming from renal failure and they are aware.

## 2023-03-11 NOTE — Assessment & Plan Note (Signed)
Checking CBC as patient did have recent procedure with bleeding afterwards from her stoma. Adjust as needed. She is also on eliquis. Denies blood in stool or rectal bleeding.

## 2023-03-11 NOTE — Assessment & Plan Note (Signed)
With some nausea and vomiting. They are willing to do IM zofran 4 mg q 6 hours prn. Rx for this and syringes sent in. She did have severe hypertension (220/120) with vomiting up dose of medications and we wish to avoid this to avoid progression of her CKD.

## 2023-03-11 NOTE — Assessment & Plan Note (Signed)
She had stopped omeprazole to take protonix and felt this was not working as well. Switch back to omeprazole 40 mg daily. Recent procedure to stretch her stoma was initially helpful then she has had recurrence of symptoms.

## 2023-03-12 ENCOUNTER — Emergency Department (HOSPITAL_COMMUNITY)
Admission: EM | Admit: 2023-03-12 | Discharge: 2023-03-12 | Disposition: A | Discharge status: Home / Self Care | Payer: Medicare Other | Attending: Student | Admitting: Student

## 2023-03-12 ENCOUNTER — Encounter (HOSPITAL_COMMUNITY): Payer: Self-pay

## 2023-03-12 DIAGNOSIS — Z87891 Personal history of nicotine dependence: Secondary | ICD-10-CM | POA: Insufficient documentation

## 2023-03-12 DIAGNOSIS — Z7901 Long term (current) use of anticoagulants: Secondary | ICD-10-CM | POA: Diagnosis not present

## 2023-03-12 DIAGNOSIS — R799 Abnormal finding of blood chemistry, unspecified: Secondary | ICD-10-CM | POA: Diagnosis present

## 2023-03-12 DIAGNOSIS — N185 Chronic kidney disease, stage 5: Secondary | ICD-10-CM | POA: Insufficient documentation

## 2023-03-12 DIAGNOSIS — Z7982 Long term (current) use of aspirin: Secondary | ICD-10-CM | POA: Insufficient documentation

## 2023-03-12 DIAGNOSIS — I5032 Chronic diastolic (congestive) heart failure: Secondary | ICD-10-CM | POA: Diagnosis not present

## 2023-03-12 DIAGNOSIS — D631 Anemia in chronic kidney disease: Secondary | ICD-10-CM | POA: Insufficient documentation

## 2023-03-12 DIAGNOSIS — I132 Hypertensive heart and chronic kidney disease with heart failure and with stage 5 chronic kidney disease, or end stage renal disease: Secondary | ICD-10-CM | POA: Diagnosis not present

## 2023-03-12 DIAGNOSIS — Z85828 Personal history of other malignant neoplasm of skin: Secondary | ICD-10-CM | POA: Diagnosis not present

## 2023-03-12 DIAGNOSIS — Z930 Tracheostomy status: Secondary | ICD-10-CM | POA: Diagnosis not present

## 2023-03-12 DIAGNOSIS — Z8673 Personal history of transient ischemic attack (TIA), and cerebral infarction without residual deficits: Secondary | ICD-10-CM | POA: Insufficient documentation

## 2023-03-12 DIAGNOSIS — Z7951 Long term (current) use of inhaled steroids: Secondary | ICD-10-CM | POA: Insufficient documentation

## 2023-03-12 DIAGNOSIS — Z79899 Other long term (current) drug therapy: Secondary | ICD-10-CM | POA: Insufficient documentation

## 2023-03-12 DIAGNOSIS — E039 Hypothyroidism, unspecified: Secondary | ICD-10-CM | POA: Insufficient documentation

## 2023-03-12 DIAGNOSIS — Z8541 Personal history of malignant neoplasm of cervix uteri: Secondary | ICD-10-CM | POA: Insufficient documentation

## 2023-03-12 DIAGNOSIS — J449 Chronic obstructive pulmonary disease, unspecified: Secondary | ICD-10-CM | POA: Insufficient documentation

## 2023-03-12 DIAGNOSIS — R0602 Shortness of breath: Secondary | ICD-10-CM | POA: Diagnosis not present

## 2023-03-12 DIAGNOSIS — D649 Anemia, unspecified: Secondary | ICD-10-CM | POA: Diagnosis not present

## 2023-03-12 LAB — CBC WITH DIFFERENTIAL/PLATELET
Abs Immature Granulocytes: 0.03 10*3/uL (ref 0.00–0.07)
Basophils Absolute: 0 10*3/uL (ref 0.0–0.1)
Basophils Relative: 0 %
Eosinophils Absolute: 0.1 10*3/uL (ref 0.0–0.5)
Eosinophils Relative: 2 %
HCT: 19.1 % — ABNORMAL LOW (ref 36.0–46.0)
Hemoglobin: 6 g/dL — CL (ref 12.0–15.0)
Immature Granulocytes: 1 %
Lymphocytes Relative: 15 %
Lymphs Abs: 0.9 10*3/uL (ref 0.7–4.0)
MCH: 27.9 pg (ref 26.0–34.0)
MCHC: 31.4 g/dL (ref 30.0–36.0)
MCV: 88.8 fL (ref 80.0–100.0)
Monocytes Absolute: 0.4 10*3/uL (ref 0.1–1.0)
Monocytes Relative: 6 %
Neutro Abs: 4.6 10*3/uL (ref 1.7–7.7)
Neutrophils Relative %: 76 %
Platelets: 192 10*3/uL (ref 150–400)
RBC: 2.15 MIL/uL — ABNORMAL LOW (ref 3.87–5.11)
RDW: 13.8 % (ref 11.5–15.5)
WBC: 6.1 10*3/uL (ref 4.0–10.5)
nRBC: 0 % (ref 0.0–0.2)

## 2023-03-12 LAB — COMPREHENSIVE METABOLIC PANEL
ALT: 10 U/L (ref 0–44)
AST: 20 U/L (ref 15–41)
Albumin: 3.2 g/dL — ABNORMAL LOW (ref 3.5–5.0)
Alkaline Phosphatase: 56 U/L (ref 38–126)
Anion gap: 13 (ref 5–15)
BUN: 61 mg/dL — ABNORMAL HIGH (ref 8–23)
CO2: 22 mmol/L (ref 22–32)
Calcium: 8.3 mg/dL — ABNORMAL LOW (ref 8.9–10.3)
Chloride: 104 mmol/L (ref 98–111)
Creatinine, Ser: 4.31 mg/dL — ABNORMAL HIGH (ref 0.44–1.00)
GFR, Estimated: 10 mL/min — ABNORMAL LOW (ref >60)
Glucose, Bld: 115 mg/dL — ABNORMAL HIGH (ref 70–99)
Potassium: 4.2 mmol/L (ref 3.5–5.1)
Sodium: 139 mmol/L (ref 135–145)
Total Bilirubin: 0.5 mg/dL (ref 0.3–1.2)
Total Protein: 6.5 g/dL (ref 6.5–8.1)

## 2023-03-12 LAB — PREPARE RBC (CROSSMATCH)

## 2023-03-12 LAB — PROTIME-INR
INR: 1.3 — ABNORMAL HIGH (ref 0.8–1.2)
Prothrombin Time: 15.9 seconds — ABNORMAL HIGH (ref 11.4–15.2)

## 2023-03-12 MED ORDER — SODIUM CHLORIDE 0.9% IV SOLUTION: Freq: Once | INTRAVENOUS | Status: AC

## 2023-03-12 MED ORDER — ONDANSETRON HCL 4 MG/2ML IJ SOLN
4.0000 mg | Freq: Once | INTRAMUSCULAR | Status: AC
  Administered 2023-03-12: 4 mg via INTRAVENOUS
  Filled 2023-03-12: qty 2

## 2023-03-12 NOTE — ED Triage Notes (Signed)
Pt sent over for blood transfusion. Hgb 6.9 03/10/23. Pt had stomach stretched per family and had labs checked following that. Denies any black tarry stool. Pt had some emesis 5-6 days. Denies pain at this time.

## 2023-03-12 NOTE — ED Notes (Signed)
Blood bank has blood ready for this patient. Informed Hannah,RN. °

## 2023-03-12 NOTE — ED Provider Notes (Signed)
[place] EMERGENCY DEPARTMENT AT [facility] Provider Note  CSN: [id number] Arrival date & time: 03/12/23 [id number]  Chief Complaint(s) Abnormal Lab  HPI [name] is a 77 y.o. 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  Patient had a TNE with with dilation on 02/11/2023 and had some mild associated bleeding afterwards but her tracheal secretions have since cleared of all blood and she is not having any additional blood around the tracheal site.  She saw her primary care physician today who found the patient to be anemic with a drop from 8.0 to 6.9.  Patient's son states that they spoke with her primary care doctor and hospital admission does not align with patient's goals of care and she was sent for blood transfusion and ultimate discharge afterwards.  Patient currently denies abdominal pain, nausea, vomiting or other systemic symptoms.   Past Medical History Past Medical History:  Diagnosis Date   Anemia    Anxiety    takes Ativan prn   Blood transfusion without reported diagnosis 09/15/12   2 units Prbc's   Broken ribs    Chronic back pain    CKD (chronic kidney disease), stage IV (HCC)    Constipation    related to pain meds   COPD (chronic obstructive pulmonary disease) ([place]) 08/10/2012   denies   Depression    Gastrostomy in place [facility])    removed   GERD (gastroesophageal reflux disease)    takes Zantac daily   Headache(784.0)    Hiatal hernia 08/10/2012   History of radiation therapy 10/17/12-11/25/12   supraglottic larynx,high risk neck tumor bed 5880 cGy/28 sessions, high risk lymph node tumor bed 5600 cGy/20 sessions, mod risk lymph node tumor bed 5040 cGy/20 sessions   Hypercholesteremia    takes Pravastatin daily   Hypertension    takes Tribenzor and Atenolol daily   Insomnia     takes Amitriptyline daily   Nausea    takes Zofran prn   PAD (peripheral artery disease) ([place])    noninvasive imaging in 2016   Pneumonia    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 08/08/2012   Shortness of breath dyspnea    Stroke [facility]) 2011   denies. no residual   Uterine cancer [facility])    Patient Active Problem List   Diagnosis Date Noted   Vision loss 11/17/2022   Pain, eye, left 11/17/2022   Chronic diastolic (congestive) heart failure ([place]) 09/02/2022   Alaryngeal voice 08/13/2022   History of CVA (cerebrovascular accident) 08/13/2022   Facial swelling 01/30/2022   History of DVT (deep vein thrombosis) 01/05/2022   Anemia in CKD (chronic kidney disease) 01/05/2022   Demand ischemia    History of head and neck cancer-s/p laryngectomy-with tracheostomy 01/02/2022   PAF (paroxysmal atrial fibrillation)-CHADS2 Vas score of 5 01/02/2022   Neck pain 07/11/2021   Aortic atherosclerosis ([place]) 04/09/2021   Moderate protein-calorie malnutrition ([place]) 04/09/2021   Low back pain 01/23/2021   HLD (hyperlipidemia) 01/13/2021   Hypothyroidism 02/25/2020   Senile osteoporosis 02/15/2020   PAD (peripheral artery disease) ([place]) 02/15/2020   Tinea corporis 02/15/2020   Gastroesophageal reflux disease without esophagitis 09/09/2019   Osteopenia 01/19/2019   CKD (chronic kidney disease) stage 5, GFR less than 15 ml/min (HCC) 01/19/2019   Diarrhea 11/04/2018   Thiamine deficiency 01/02/2018   Prediabetes 12/28/2017  Dietary folate deficiency anemia 12/28/2017   Moderate episode of recurrent major depressive disorder ([place]) 10/22/2017   S/P laryngectomy 11/03/2016   Stenosis of tracheal stoma 06/10/2016   Diverticulum of pharynx 08/21/2015   Esophageal obstruction 07/17/2015   Esophageal stricture 07/17/2015   Major depression, chronic 02/28/2015   Osteoporosis 02/01/2015   Dysphagia, pharyngoesophageal phase 05/22/2014   History of radiation therapy    Insomnia 02/07/2013    History of uterine cancer 11/24/2012   Chronic pain syndrome 09/03/2012   COPD (chronic obstructive pulmonary disease) ([place]) 08/10/2012   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 08/08/2012    Class: Chronic   Hypertension    GAD (generalized anxiety disorder)    Home Medication(s) Prior to Admission medications   Medication Sig Start Date End Date Taking? Authorizing Provider  acetaminophen (TYLENOL) 500 MG tablet Take 1,000 mg by mouth as needed for moderate pain. Patient not taking: Reported on 03/10/2023    [provider]  acetaminophen-codeine (TYLENOL #3) 300-30 MG tablet TAKE 1 TABLET BY MOUTH EVERY 6 HOURS AS NEEDED 02/18/23   Hoyt Koch, MD  apixaban (ELIQUIS) 2.5 MG TABS tablet Take 1 tablet (2.5 mg total) by mouth 2 (two) times daily. 02/10/22   Hoyt Koch, MD  aspirin EC 81 MG tablet Take 1 tablet (81 mg total) by mouth daily. Swallow whole. 10/31/22   Regalado, Belkys A, MD  carvedilol (COREG) 25 MG tablet TAKE 1 TABLET (25 MG TOTAL) BY MOUTH TWICE A DAY WITH MEALS 12/30/22   Hoyt Koch, MD  Cholecalciferol (VITAMIN D) 50 MCG (2000 UT) CAPS Take 2,000 Units by mouth daily.    [provider]  cilostazol (PLETAL) 100 MG tablet TAKE 1 TABLET BY MOUTH TWICE A DAY 01/18/23   Hoyt Koch, MD  cloNIDine (CATAPRES - DOSED IN MG/24 HR) 0.1 mg/24hr patch Place 1 patch (0.1 mg total) onto the skin once a week. 09/18/22   Barb Merino, MD  cyclobenzaprine (FLEXERIL) 5 MG tablet TAKE 0.5 TABLETS (2.5 MG TOTAL) BY MOUTH AT BEDTIME AS NEEDED FOR MUSCLE SPASMS. 01/18/23   Hoyt Koch, MD  diphenoxylate-atropine (LOMOTIL) 2.5-0.025 MG tablet Take 1 tablet by mouth daily as needed for diarrhea or loose stools. 12/30/22   Hoyt Koch, MD  DULoxetine (CYMBALTA) 20 MG capsule TAKE 1 CAPSULE BY MOUTH EVERY DAY Patient not taking: Reported on 03/10/2023 01/18/23   Hoyt Koch, MD  DULoxetine (CYMBALTA) 30 MG  capsule Take 1 capsule (30 mg total) by mouth daily. 11/17/22   Hoyt Koch, MD  ferrous sulfate 325 (65 FE) MG tablet Take 1 tablet (325 mg total) by mouth daily with breakfast. Patient not taking: Reported on 03/10/2023 10/31/22   Regalado, Jerald Kief A, MD  furosemide (LASIX) 40 MG tablet Take 2 tablets (80 mg total) by mouth 2 (two) times daily. For increase edema 03/10/23   Hoyt Koch, MD  guaiFENesin (MUCINEX) 600 MG 12 hr tablet Take 2 tablets (1,200 mg total) by mouth 2 (two) times daily. Patient not taking: Reported on 03/10/2023 10/31/22   Niel Hummer A, MD  hydrALAZINE (APRESOLINE) 100 MG tablet Take 1 pill 3 times a day (total dose 125 mg with 25 mg tablet) 03/10/23   Hoyt Koch, MD  hydrALAZINE (APRESOLINE) 25 MG tablet Give 1 pill TID (1 of 100 mg and 1 of 25 mg tablets 3 times a day for total dose 125 mg TID) Hold if SBP less than 120 03/10/23  Hoyt Koch, MD  ipratropium-albuterol (DUONEB) 0.5-2.5 (3) MG/3ML SOLN Take 3 mLs by nebulization 2 (two) times daily. Patient not taking: Reported on 03/10/2023 10/31/22   Niel Hummer A, MD  isosorbide mononitrate (IMDUR) 60 MG 24 hr tablet TAKE 1 TABLET BY MOUTH EVERY DAY 11/10/22   Hoyt Koch, MD  levothyroxine (SYNTHROID) 50 MCG tablet Take 1 tablet (50 mcg total) by mouth daily before breakfast. Additional refill from pcp 10/20/22   Nche, Charlene Brooke, NP  LORazepam (ATIVAN) 1 MG tablet TAKE 1 TABLET BY MOUTH 2 TIMES DAILY AS NEEDED FOR ANXIETY. 03/01/23   Hoyt Koch, MD  minoxidil (LONITEN) 2.5 MG tablet Take 1 tablet (2.5 mg total) by mouth 2 (two) times daily. 10/23/22   Hoyt Koch, MD  mirtazapine (REMERON) 15 MG tablet TAKE 1 TABLET BY MOUTH EVERYDAY AT BEDTIME Patient taking differently: Take 15 mg by mouth at bedtime. 09/18/22   Hoyt Koch, MD  nitroGLYCERIN (NITROSTAT) 0.4 MG SL tablet Place 1 tablet (0.4 mg total) under the tongue every 5 (five)  minutes x 3 doses as needed for chest pain. 10/31/22   Regalado, Belkys A, MD  omeprazole (PRILOSEC) 40 MG capsule Take 1 capsule (40 mg total) by mouth daily. 03/10/23   Hoyt Koch, MD  ondansetron (ZOFRAN) 4 MG tablet TAKE 1 TABLET BY MOUTH EVERY 8 HOURS AS NEEDED FOR NAUSEA AND VOMITING 01/15/23   Hoyt Koch, MD  ondansetron [facility]) 40 MG/20ML SOLN injection Inject 2 mL intramuscularly every 6 hours as needed for nausea or vomiting 03/10/23   Hoyt Koch, MD  rosuvastatin (CRESTOR) 20 MG tablet TAKE 1 TABLET BY MOUTH EVERY DAY 02/18/23   Hoyt Koch, MD  sodium bicarbonate 650 MG tablet Take 650 mg by mouth 3 (three) times daily. 10/01/22   [provider]  SYRINGE-NEEDLE, DISP, 3 ML (B-D INTEGRA SYRINGE) 23G X 1" 3 ML MISC Use with zofran injections 03/10/23   Hoyt Koch, MD  levocetirizine (XYZAL) 5 MG tablet TAKE 1 TABLET(5 MG) BY MOUTH EVERY EVENING 02/21/20 06/10/20  Janith Lima, MD                                                                                                                                    Past Surgical History Past Surgical History:  Procedure Laterality Date   ABDOMINAL SURGERY     r/t uterine carcinoma   APPENDECTOMY     DIRECT LARYNGOSCOPY N/A 05/22/2014   Procedure: DIRECT LARYNGOSCOPY WITH ESOPHAGEAL DILATION;  Surgeon: Jerrell Belfast, MD;  Location: [place];  Service: ENT;  Laterality: N/A;   ESOPHAGOSCOPY WITH DILITATION N/A 05/29/2015   Procedure: ESOPHAGOSCOPY WITH ESOPHAGEAL DILITATION;  Surgeon: Jerrell Belfast, MD;  Location: [facility] OR;  Service: ENT;  Laterality: N/A;   Gastrostomy Tube removed   2013   GASTROSTOMY W/ FEEDING TUBE  12   HERNIA REPAIR  child   LARYNGETOMY  08/31/2012   Procedure: LARYNGECTOMY;  Surgeon: Jerrell Belfast, MD;  Location: [place];  Service: ENT;  Laterality: N/A;   LARYNGOSCOPY  08/10/2012   Procedure: LARYNGOSCOPY;  Surgeon: Jerrell Belfast, MD;  Location: [facility];   Service: ENT;  Laterality: N/A;  with biopsy   RADICAL NECK DISSECTION  08/31/2012   Procedure: RADICAL NECK DISSECTION;  Surgeon: Jerrell Belfast, MD;  Location: [place];  Service: ENT;  Laterality: Bilateral;   TRACHEAL ESOPHOGEAL PUNCTURE WITH REPAIR STOMA N/A 09/08/2013   Procedure: TRACHEAL ESOPHOGEAL PUNCTURE WITH PLACEMENT OF  [place] ;  Surgeon: Jerrell Belfast, MD;  Location: [place];  Service: ENT;  Laterality: N/A;   TRACHEOSTOMY TUBE PLACEMENT  08/10/2012   Procedure: TRACHEOSTOMY;  Surgeon: Jerrell Belfast, MD;  Location: [facility];  Service: ENT;  Laterality: N/A;   Family History Family History  Problem Relation Age of Onset   Throat cancer Father    Brain cancer Brother    CAD Neg Hx     Social History Social History   Tobacco Use   Smoking status: Former    Packs/day: 0.25    Years: 50.00    Additional pack years: 0.00    Total pack years: 12.50    Types: Cigarettes    Quit date: 05/27/2013    Years since quitting: 9.7   Smokeless tobacco: Never  Vaping Use   Vaping Use: Never used  Substance Use Topics   Alcohol use: Not Currently   Drug use: No   Allergies Xyzal [levocetirizine dihydrochloride], Augmentin [amoxicillin-pot clavulanate], and Tribenzor [olmesartan-amlodipine-hctz]  Review of Systems Review of Systems  Constitutional:  Positive for fatigue.  Respiratory:  Positive for shortness of breath.     Physical Exam Vital Signs  I have reviewed the triage vital signs BP (!) 173/58   Pulse 83   Temp 98.2 F (36.8 C) (Oral)   Resp 18   Ht 5\' 2"  (1.575 m)   Wt 51.7 kg   SpO2 98%   BMI 20.85 kg/m   Physical Exam Vitals and nursing note reviewed.  Constitutional:      General: She is not in acute distress.    Appearance: She is well-developed.  HENT:     Head: Normocephalic and atraumatic.  Eyes:     Conjunctiva/sclera: Conjunctivae normal.  Cardiovascular:     Rate and Rhythm: Normal rate and regular rhythm.     Heart sounds: No  murmur heard. Pulmonary:     Effort: Pulmonary effort is normal. No respiratory distress.  Musculoskeletal:        General: No swelling.     Cervical back: Neck supple.  Skin:    General: Skin is warm and dry.     Capillary Refill: Capillary refill takes less than 2 seconds.     Coloration: Skin is pale.  Neurological:     Mental Status: She is alert.  Psychiatric:        Mood and Affect: Mood normal.     ED Results and Treatments Labs (all labs ordered are listed, but only abnormal results are displayed) Labs Reviewed  PROTIME-INR - Abnormal; Notable for the following components:      Result Value   Prothrombin Time 15.9 (*)    INR 1.3 (*)    All other components within normal limits  CBC WITH DIFFERENTIAL/PLATELET - Abnormal; Notable for the following components:   RBC 2.15 (*)    Hemoglobin 6.0 (*)    HCT 19.1 (*)  All other components within normal limits  COMPREHENSIVE METABOLIC PANEL - Abnormal; Notable for the following components:   Glucose, Bld 115 (*)    BUN 61 (*)    Creatinine, Ser 4.31 (*)    Calcium 8.3 (*)    Albumin 3.2 (*)    GFR, Estimated 10 (*)    All other components within normal limits  TYPE AND SCREEN  PREPARE RBC (CROSSMATCH)                                                                                                                          Radiology No results found.  Pertinent labs & imaging results that were available during my care of the patient were reviewed by me and considered in my medical decision making (see MDM for details).  Medications Ordered in ED Medications  0.9 %  sodium chloride infusion (Manually program via Guardrails IV Fluids) (0 mLs Intravenous Stopped 03/12/23 1550)  ondansetron (ZOFRAN) injection 4 mg (4 mg Intravenous Given 03/12/23 1127)                                                                                                                                     Procedures .Critical Care  Performed  by: Teressa Lower, MD Authorized by: Teressa Lower, MD   Critical care provider statement:    Critical care time (minutes):  30   Critical care was necessary to treat or prevent imminent or life-threatening deterioration of the following conditions: Critical anemia requiring blood transfusion.   Critical care was time spent personally by me on the following activities:  Development of treatment plan with patient or surrogate, discussions with consultants, evaluation of patient's response to treatment, examination of patient, ordering and review of laboratory studies, ordering and review of radiographic studies, ordering and performing treatments and interventions, pulse oximetry, re-evaluation of patient's condition and review of old charts   (including critical care time)  Medical Decision Making / ED Course   This patient presents to the ED for concern of fatigue, shortness of breath, anemia, this involves an extensive number of treatment options, and is a complaint that carries with it a high risk of complications and morbidity.  The differential diagnosis includes acute blood loss anemia, decreased EPO production from CKD, tracheal bleed, anemia of chronic disease  MDM: Patient seen emergency room for evaluation of fatigue, anemia and shortness of breath.  Physical exam reveals a well-appearing tracheostomy site with no evidence of bleeding and generalized skin pallor.  Laboratory evaluation with a creatinine of 4.31, BUN 61 consistent with patient's history of CKD 5, hemoglobin 6.0 with an MCV of 88.8, INR 1.3.  Per patient's medical wishes, patient received 1 unit packed red blood cell and was then discharged from the emergency department.  She does not want to be admitted to the hospital and she will follow with her primary care physician.   Additional history obtained: -Additional history obtained from son -External records from outside source obtained and reviewed including: Chart  review including previous notes, labs, imaging, consultation notes   Lab Tests: -I ordered, reviewed, and interpreted labs.   The pertinent results include:   Labs Reviewed  PROTIME-INR - Abnormal; Notable for the following components:      Result Value   Prothrombin Time 15.9 (*)    INR 1.3 (*)    All other components within normal limits  CBC WITH DIFFERENTIAL/PLATELET - Abnormal; Notable for the following components:   RBC 2.15 (*)    Hemoglobin 6.0 (*)    HCT 19.1 (*)    All other components within normal limits  COMPREHENSIVE METABOLIC PANEL - Abnormal; Notable for the following components:   Glucose, Bld 115 (*)    BUN 61 (*)    Creatinine, Ser 4.31 (*)    Calcium 8.3 (*)    Albumin 3.2 (*)    GFR, Estimated 10 (*)    All other components within normal limits  TYPE AND SCREEN  PREPARE RBC (CROSSMATCH)      EKG   EKG Interpretation  Date/Time:  Friday March 12 2023 10:08:53 EDT Ventricular Rate:  89 PR Interval:  152 QRS Duration: 83 QT Interval:  345 QTC Calculation: 420 R Axis:   78 Text Interpretation: Sinus rhythm Confirmed by [place] ([id number]) on 03/12/2023 7:17:42 PM          Medicines ordered and prescription drug management: Meds ordered this encounter  Medications   0.9 %  sodium chloride infusion (Manually program via Guardrails IV Fluids)   ondansetron (ZOFRAN) injection 4 mg    -I have reviewed the patients home medicines and have made adjustments as needed  Critical interventions Blood transfusion    Cardiac Monitoring: The patient was maintained on a cardiac monitor.  I personally viewed and interpreted the cardiac monitored which showed an underlying rhythm of: NSR  Social Determinants of Health:  Factors impacting patients care include: Medical wishes include not wanting to be admitted to the hospital   Reevaluation: After the interventions noted above, I reevaluated the patient and found that they have :improved  Co  morbidities that complicate the patient evaluation  Past Medical History:  Diagnosis Date   Anemia    Anxiety    takes Ativan prn   Blood transfusion without reported diagnosis 09/15/12   2 units Prbc's   Broken ribs    Chronic back pain    CKD (chronic kidney disease), stage IV (HCC)    Constipation    related to pain meds   COPD (chronic obstructive pulmonary disease) ([place]) 08/10/2012   denies   Depression    Gastrostomy in place [facility])    removed   GERD (gastroesophageal reflux disease)    takes Zantac daily   Headache(784.0)    Hiatal hernia 08/10/2012   History of radiation therapy 10/17/12-11/25/12   supraglottic larynx,high risk neck tumor bed 5880 cGy/28 sessions, high risk lymph  node tumor bed 5600 cGy/20 sessions, mod risk lymph node tumor bed 5040 cGy/20 sessions   Hypercholesteremia    takes Pravastatin daily   Hypertension    takes Tribenzor and Atenolol daily   Insomnia    takes Amitriptyline daily   Nausea    takes Zofran prn   PAD (peripheral artery disease) ([place])    noninvasive imaging in 2016   Pneumonia    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 08/08/2012   Shortness of breath dyspnea    Stroke [facility]) 2011   denies. no residual   Uterine cancer (HCC)       Dispostion: I considered admission for this patient, and after blood transfusion, patient discharged with outpatient follow-up per her medical wishes     Final Clinical Impression(s) / ED Diagnoses Final diagnoses:  Symptomatic anemia     @PCDICTATION @    Teressa Lower, MD 03/12/23 1918

## 2023-03-14 LAB — TYPE AND SCREEN
ABO/RH(D): O POS
Antibody Screen: NEGATIVE
Unit division: 0

## 2023-03-14 LAB — BPAM RBC
Blood Product Expiration Date: 202404252359
ISSUE DATE / TIME: 202403291311
Unit Type and Rh: 5100

## 2023-03-17 ENCOUNTER — Telehealth: Payer: Self-pay

## 2023-03-17 NOTE — Telephone Encounter (Signed)
        Patient  visited [facility] 3/29    Telephone encounter attempt :  1st  A HIPAA compliant voice message was left requesting a return call.  Instructed patient to call back .    [place] [phone] [address], [place], [place] [address] Phone: [phone] Email: [email or web address]

## 2023-03-18 ENCOUNTER — Telehealth: Payer: Self-pay

## 2023-03-18 NOTE — Telephone Encounter (Signed)
     Patient  visit on 3/29  at [facility] you been able to follow up with your primary care physician? No   The patient was or was not able to obtain any needed medicine or equipment.  Na   Are there diet recommendations that you are having difficulty following? Na   Patient expresses understanding of discharge instructions and education provided has no other needs at this time. Yes       [place] [phone] [address], [place], [place] [address] Phone: [phone] Email: [email or web address]

## 2023-03-20 ENCOUNTER — Other Ambulatory Visit: Payer: Self-pay | Admitting: Internal Medicine

## 2023-03-25 ENCOUNTER — Other Ambulatory Visit: Payer: Self-pay | Admitting: Internal Medicine

## 2023-04-06 ENCOUNTER — Ambulatory Visit (INDEPENDENT_AMBULATORY_CARE_PROVIDER_SITE_OTHER): Payer: Medicare Other | Admitting: Emergency Medicine

## 2023-04-06 ENCOUNTER — Emergency Department (HOSPITAL_COMMUNITY): Payer: Medicare Other

## 2023-04-06 ENCOUNTER — Inpatient Hospital Stay (HOSPITAL_COMMUNITY)
Admission: EM | Admit: 2023-04-06 | Discharge: 2023-04-13 | DRG: 291 | Disposition: A | Discharge status: Home / Self Care | Payer: Medicare Other | Attending: Internal Medicine | Admitting: Internal Medicine

## 2023-04-06 ENCOUNTER — Encounter (HOSPITAL_COMMUNITY): Payer: Self-pay

## 2023-04-06 ENCOUNTER — Encounter: Payer: Self-pay | Admitting: Emergency Medicine

## 2023-04-06 ENCOUNTER — Other Ambulatory Visit: Payer: Self-pay

## 2023-04-06 VITALS — BP 160/74 | HR 80 | Temp 98.0°F | Ht 62.0 in | Wt 114.0 lb

## 2023-04-06 DIAGNOSIS — I2510 Atherosclerotic heart disease of native coronary artery without angina pectoris: Secondary | ICD-10-CM | POA: Diagnosis present

## 2023-04-06 DIAGNOSIS — N185 Chronic kidney disease, stage 5: Secondary | ICD-10-CM | POA: Diagnosis not present

## 2023-04-06 DIAGNOSIS — N179 Acute kidney failure, unspecified: Secondary | ICD-10-CM | POA: Diagnosis present

## 2023-04-06 DIAGNOSIS — J90 Pleural effusion, not elsewhere classified: Secondary | ICD-10-CM | POA: Diagnosis not present

## 2023-04-06 DIAGNOSIS — I10 Essential (primary) hypertension: Secondary | ICD-10-CM | POA: Diagnosis not present

## 2023-04-06 DIAGNOSIS — M47815 Spondylosis without myelopathy or radiculopathy, thoracolumbar region: Secondary | ICD-10-CM

## 2023-04-06 DIAGNOSIS — D649 Anemia, unspecified: Secondary | ICD-10-CM | POA: Diagnosis not present

## 2023-04-06 DIAGNOSIS — F329 Major depressive disorder, single episode, unspecified: Secondary | ICD-10-CM | POA: Diagnosis present

## 2023-04-06 DIAGNOSIS — Z7901 Long term (current) use of anticoagulants: Secondary | ICD-10-CM | POA: Diagnosis not present

## 2023-04-06 DIAGNOSIS — R0603 Acute respiratory distress: Secondary | ICD-10-CM | POA: Insufficient documentation

## 2023-04-06 DIAGNOSIS — Z923 Personal history of irradiation: Secondary | ICD-10-CM

## 2023-04-06 DIAGNOSIS — Z8589 Personal history of malignant neoplasm of other organs and systems: Secondary | ICD-10-CM

## 2023-04-06 DIAGNOSIS — D631 Anemia in chronic kidney disease: Secondary | ICD-10-CM | POA: Diagnosis not present

## 2023-04-06 DIAGNOSIS — Z79899 Other long term (current) drug therapy: Secondary | ICD-10-CM

## 2023-04-06 DIAGNOSIS — Z8673 Personal history of transient ischemic attack (TIA), and cerebral infarction without residual deficits: Secondary | ICD-10-CM

## 2023-04-06 DIAGNOSIS — I120 Hypertensive chronic kidney disease with stage 5 chronic kidney disease or end stage renal disease: Secondary | ICD-10-CM | POA: Diagnosis not present

## 2023-04-06 DIAGNOSIS — Z7902 Long term (current) use of antithrombotics/antiplatelets: Secondary | ICD-10-CM

## 2023-04-06 DIAGNOSIS — J811 Chronic pulmonary edema: Secondary | ICD-10-CM | POA: Diagnosis not present

## 2023-04-06 DIAGNOSIS — Z9889 Other specified postprocedural states: Secondary | ICD-10-CM

## 2023-04-06 DIAGNOSIS — R490 Dysphonia: Secondary | ICD-10-CM | POA: Diagnosis present

## 2023-04-06 DIAGNOSIS — J9601 Acute respiratory failure with hypoxia: Secondary | ICD-10-CM | POA: Diagnosis present

## 2023-04-06 DIAGNOSIS — J441 Chronic obstructive pulmonary disease with (acute) exacerbation: Secondary | ICD-10-CM

## 2023-04-06 DIAGNOSIS — I739 Peripheral vascular disease, unspecified: Secondary | ICD-10-CM | POA: Diagnosis present

## 2023-04-06 DIAGNOSIS — I5032 Chronic diastolic (congestive) heart failure: Secondary | ICD-10-CM | POA: Diagnosis present

## 2023-04-06 DIAGNOSIS — Z87891 Personal history of nicotine dependence: Secondary | ICD-10-CM | POA: Diagnosis not present

## 2023-04-06 DIAGNOSIS — R0902 Hypoxemia: Secondary | ICD-10-CM | POA: Diagnosis not present

## 2023-04-06 DIAGNOSIS — Z1152 Encounter for screening for COVID-19: Secondary | ICD-10-CM

## 2023-04-06 DIAGNOSIS — Z79890 Hormone replacement therapy (postmenopausal): Secondary | ICD-10-CM

## 2023-04-06 DIAGNOSIS — Z66 Do not resuscitate: Secondary | ICD-10-CM | POA: Diagnosis present

## 2023-04-06 DIAGNOSIS — N189 Chronic kidney disease, unspecified: Secondary | ICD-10-CM | POA: Diagnosis not present

## 2023-04-06 DIAGNOSIS — I509 Heart failure, unspecified: Secondary | ICD-10-CM | POA: Diagnosis not present

## 2023-04-06 DIAGNOSIS — N2889 Other specified disorders of kidney and ureter: Secondary | ICD-10-CM | POA: Diagnosis not present

## 2023-04-06 DIAGNOSIS — E7800 Pure hypercholesterolemia, unspecified: Secondary | ICD-10-CM | POA: Diagnosis not present

## 2023-04-06 DIAGNOSIS — I3139 Other pericardial effusion (noninflammatory): Secondary | ICD-10-CM | POA: Diagnosis not present

## 2023-04-06 DIAGNOSIS — R0602 Shortness of breath: Secondary | ICD-10-CM | POA: Diagnosis not present

## 2023-04-06 DIAGNOSIS — Z7982 Long term (current) use of aspirin: Secondary | ICD-10-CM

## 2023-04-06 DIAGNOSIS — J449 Chronic obstructive pulmonary disease, unspecified: Secondary | ICD-10-CM | POA: Diagnosis present

## 2023-04-06 DIAGNOSIS — G894 Chronic pain syndrome: Secondary | ICD-10-CM | POA: Diagnosis present

## 2023-04-06 DIAGNOSIS — Z930 Tracheostomy status: Secondary | ICD-10-CM | POA: Diagnosis not present

## 2023-04-06 DIAGNOSIS — I132 Hypertensive heart and chronic kidney disease with heart failure and with stage 5 chronic kidney disease, or end stage renal disease: Secondary | ICD-10-CM | POA: Diagnosis not present

## 2023-04-06 DIAGNOSIS — Z808 Family history of malignant neoplasm of other organs or systems: Secondary | ICD-10-CM

## 2023-04-06 DIAGNOSIS — I480 Paroxysmal atrial fibrillation: Secondary | ICD-10-CM | POA: Diagnosis not present

## 2023-04-06 DIAGNOSIS — I5033 Acute on chronic diastolic (congestive) heart failure: Secondary | ICD-10-CM

## 2023-04-06 DIAGNOSIS — E039 Hypothyroidism, unspecified: Secondary | ICD-10-CM | POA: Diagnosis present

## 2023-04-06 DIAGNOSIS — Z86718 Personal history of other venous thrombosis and embolism: Secondary | ICD-10-CM | POA: Diagnosis not present

## 2023-04-06 DIAGNOSIS — F411 Generalized anxiety disorder: Secondary | ICD-10-CM | POA: Diagnosis present

## 2023-04-06 DIAGNOSIS — Z8542 Personal history of malignant neoplasm of other parts of uterus: Secondary | ICD-10-CM

## 2023-04-06 DIAGNOSIS — R0600 Dyspnea, unspecified: Principal | ICD-10-CM

## 2023-04-06 DIAGNOSIS — R059 Cough, unspecified: Secondary | ICD-10-CM | POA: Diagnosis not present

## 2023-04-06 DIAGNOSIS — I129 Hypertensive chronic kidney disease with stage 1 through stage 4 chronic kidney disease, or unspecified chronic kidney disease: Secondary | ICD-10-CM | POA: Diagnosis not present

## 2023-04-06 DIAGNOSIS — Z8521 Personal history of malignant neoplasm of larynx: Secondary | ICD-10-CM

## 2023-04-06 DIAGNOSIS — K219 Gastro-esophageal reflux disease without esophagitis: Secondary | ICD-10-CM | POA: Diagnosis present

## 2023-04-06 LAB — COMPREHENSIVE METABOLIC PANEL
ALT: 12 U/L (ref 0–44)
AST: 18 U/L (ref 15–41)
Albumin: 3.4 g/dL — ABNORMAL LOW (ref 3.5–5.0)
Alkaline Phosphatase: 69 U/L (ref 38–126)
Anion gap: 13 (ref 5–15)
BUN: 83 mg/dL — ABNORMAL HIGH (ref 8–23)
CO2: 23 mmol/L (ref 22–32)
Calcium: 8.6 mg/dL — ABNORMAL LOW (ref 8.9–10.3)
Chloride: 104 mmol/L (ref 98–111)
Creatinine, Ser: 4.6 mg/dL — ABNORMAL HIGH (ref 0.44–1.00)
GFR, Estimated: 9 mL/min — ABNORMAL LOW (ref >60)
Glucose, Bld: 122 mg/dL — ABNORMAL HIGH (ref 70–99)
Potassium: 4.5 mmol/L (ref 3.5–5.1)
Sodium: 140 mmol/L (ref 135–145)
Total Bilirubin: 0.5 mg/dL (ref 0.3–1.2)
Total Protein: 7.1 g/dL (ref 6.5–8.1)

## 2023-04-06 LAB — BLOOD GAS, VENOUS
Acid-base deficit: 0.6 mmol/L (ref 0.0–2.0)
Bicarbonate: 25.4 mmol/L (ref 20.0–28.0)
O2 Saturation: 57.5 %
Patient temperature: 37
pCO2, Ven: 46 mmHg (ref 44–60)
pH, Ven: 7.35 (ref 7.25–7.43)
pO2, Ven: 32 mmHg (ref 32–45)

## 2023-04-06 LAB — CBC WITH DIFFERENTIAL/PLATELET
Abs Immature Granulocytes: 0.03 10*3/uL (ref 0.00–0.07)
Basophils Absolute: 0 10*3/uL (ref 0.0–0.1)
Basophils Relative: 0 %
Eosinophils Absolute: 0.1 10*3/uL (ref 0.0–0.5)
Eosinophils Relative: 2 %
HCT: 21.5 % — ABNORMAL LOW (ref 36.0–46.0)
Hemoglobin: 6.7 g/dL — CL (ref 12.0–15.0)
Immature Granulocytes: 0 %
Lymphocytes Relative: 12 %
Lymphs Abs: 0.8 10*3/uL (ref 0.7–4.0)
MCH: 27.7 pg (ref 26.0–34.0)
MCHC: 31.2 g/dL (ref 30.0–36.0)
MCV: 88.8 fL (ref 80.0–100.0)
Monocytes Absolute: 0.5 10*3/uL (ref 0.1–1.0)
Monocytes Relative: 7 %
Neutro Abs: 5.5 10*3/uL (ref 1.7–7.7)
Neutrophils Relative %: 79 %
Platelets: 187 10*3/uL (ref 150–400)
RBC: 2.42 MIL/uL — ABNORMAL LOW (ref 3.87–5.11)
RDW: 15.5 % (ref 11.5–15.5)
WBC: 7 10*3/uL (ref 4.0–10.5)
nRBC: 0 % (ref 0.0–0.2)

## 2023-04-06 LAB — LACTIC ACID, PLASMA
Lactic Acid, Venous: 0.9 mmol/L (ref 0.5–1.9)
Lactic Acid, Venous: 0.6 mmol/L (ref 0.5–1.9)

## 2023-04-06 LAB — BRAIN NATRIURETIC PEPTIDE: B Natriuretic Peptide: 850 pg/mL — ABNORMAL HIGH (ref 0.0–100.0)

## 2023-04-06 LAB — SARS CORONAVIRUS 2 BY RT PCR: SARS Coronavirus 2 by RT PCR: NEGATIVE

## 2023-04-06 LAB — TROPONIN I (HIGH SENSITIVITY)
Troponin I (High Sensitivity): 544 ng/L (ref <18)
Troponin I (High Sensitivity): 472 ng/L (ref <18)

## 2023-04-06 MED ORDER — DULOXETINE HCL 30 MG PO CPEP: 30.0000 mg | ORAL_CAPSULE | Freq: Every day | ORAL | Status: DC

## 2023-04-06 MED ORDER — HYDRALAZINE HCL 100 MG PO TABS: 100.0000 mg | ORAL_TABLET | Freq: Three times a day (TID) | ORAL | Status: DC

## 2023-04-06 MED ORDER — MINOXIDIL 2.5 MG PO TABS: 2.5000 mg | ORAL_TABLET | Freq: Two times a day (BID) | ORAL | Status: DC

## 2023-04-06 MED ORDER — LORAZEPAM 1 MG PO TABS
1.0000 mg | ORAL_TABLET | Freq: Two times a day (BID) | ORAL | Status: DC | PRN
  Administered 2023-04-07 – 2023-04-08 (×2): 1 mg via ORAL
  Filled 2023-04-06 (×2): qty 1

## 2023-04-06 MED ORDER — ROSUVASTATIN CALCIUM 10 MG PO TABS: 10.0000 mg | ORAL_TABLET | Freq: Every day | ORAL | Status: DC

## 2023-04-06 MED ORDER — FUROSEMIDE 10 MG/ML IJ SOLN
80.0000 mg | Freq: Once | INTRAMUSCULAR | Status: AC
  Administered 2023-04-06: 80 mg via INTRAVENOUS
  Filled 2023-04-06: qty 8

## 2023-04-06 MED ORDER — HYDRALAZINE HCL 25 MG PO TABS: 25.0000 mg | ORAL_TABLET | Freq: Three times a day (TID) | ORAL | Status: DC

## 2023-04-06 MED ORDER — SODIUM CHLORIDE 0.9% FLUSH
3.0000 mL | Freq: Two times a day (BID) | INTRAVENOUS | Status: DC
  Administered 2023-04-06 – 2023-04-13 (×12): 3 mL via INTRAVENOUS

## 2023-04-06 MED ORDER — SODIUM BICARBONATE 650 MG PO TABS
650.0000 mg | ORAL_TABLET | Freq: Three times a day (TID) | ORAL | Status: DC
  Administered 2023-04-06 – 2023-04-13 (×21): 650 mg via ORAL
  Filled 2023-04-06 (×21): qty 1

## 2023-04-06 MED ORDER — ISOSORBIDE MONONITRATE ER 60 MG PO TB24
60.0000 mg | ORAL_TABLET | Freq: Every day | ORAL | Status: DC
  Administered 2023-04-07 – 2023-04-13 (×7): 60 mg via ORAL
  Filled 2023-04-06 (×7): qty 1

## 2023-04-06 MED ORDER — SODIUM CHLORIDE 0.9% FLUSH: 3.0000 mL | INTRAVENOUS | Status: DC | PRN

## 2023-04-06 MED ORDER — CARVEDILOL 25 MG PO TABS
25.0000 mg | ORAL_TABLET | Freq: Two times a day (BID) | ORAL | Status: DC
  Administered 2023-04-06 – 2023-04-13 (×14): 25 mg via ORAL
  Filled 2023-04-06 (×13): qty 1

## 2023-04-06 MED ORDER — FUROSEMIDE 10 MG/ML IJ SOLN: 40.0000 mg | Freq: Once | INTRAMUSCULAR | Status: DC

## 2023-04-06 MED ORDER — HYDRALAZINE HCL 50 MG PO TABS
100.0000 mg | ORAL_TABLET | Freq: Three times a day (TID) | ORAL | Status: DC
  Administered 2023-04-06 – 2023-04-13 (×21): 100 mg via ORAL
  Filled 2023-04-06 (×22): qty 2

## 2023-04-06 MED ORDER — LEVOTHYROXINE SODIUM 50 MCG PO TABS
50.0000 ug | ORAL_TABLET | Freq: Every day | ORAL | Status: DC
  Administered 2023-04-07 – 2023-04-13 (×7): 50 ug via ORAL
  Filled 2023-04-06 (×7): qty 1

## 2023-04-06 MED ORDER — HYDRALAZINE HCL 25 MG PO TABS
25.0000 mg | ORAL_TABLET | Freq: Three times a day (TID) | ORAL | Status: DC
  Administered 2023-04-06 – 2023-04-13 (×21): 25 mg via ORAL
  Filled 2023-04-06 (×22): qty 1

## 2023-04-06 MED ORDER — ASPIRIN 81 MG PO TBEC
81.0000 mg | DELAYED_RELEASE_TABLET | Freq: Every day | ORAL | Status: DC
  Administered 2023-04-08 – 2023-04-13 (×6): 81 mg via ORAL
  Filled 2023-04-06 (×5): qty 1

## 2023-04-06 MED ORDER — CILOSTAZOL 100 MG PO TABS
100.0000 mg | ORAL_TABLET | Freq: Two times a day (BID) | ORAL | Status: DC
  Administered 2023-04-06 – 2023-04-13 (×14): 100 mg via ORAL
  Filled 2023-04-06 (×14): qty 1

## 2023-04-06 MED ORDER — APIXABAN 2.5 MG PO TABS
2.5000 mg | ORAL_TABLET | Freq: Two times a day (BID) | ORAL | Status: DC
  Administered 2023-04-06 – 2023-04-13 (×13): 2.5 mg via ORAL
  Filled 2023-04-06 (×13): qty 1

## 2023-04-06 MED ORDER — PANTOPRAZOLE SODIUM 40 MG PO TBEC
40.0000 mg | DELAYED_RELEASE_TABLET | Freq: Every day | ORAL | Status: DC
  Administered 2023-04-07 – 2023-04-13 (×7): 40 mg via ORAL
  Filled 2023-04-06 (×7): qty 1

## 2023-04-06 MED ORDER — FUROSEMIDE 10 MG/ML IJ SOLN
40.0000 mg | Freq: Once | INTRAMUSCULAR | Status: AC
  Administered 2023-04-06: 40 mg via INTRAVENOUS
  Filled 2023-04-06: qty 4

## 2023-04-06 MED ORDER — SODIUM CHLORIDE 0.9 % IV SOLN
250.0000 mL | INTRAVENOUS | Status: DC | PRN
  Administered 2023-04-07: 250 mL via INTRAVENOUS

## 2023-04-06 MED ORDER — LABETALOL HCL 5 MG/ML IV SOLN
20.0000 mg | INTRAVENOUS | Status: DC | PRN
  Administered 2023-04-06 – 2023-04-08 (×3): 20 mg via INTRAVENOUS
  Filled 2023-04-06 (×4): qty 4

## 2023-04-06 MED ORDER — MIRTAZAPINE 15 MG PO TABS
15.0000 mg | ORAL_TABLET | Freq: Every day | ORAL | Status: DC
  Administered 2023-04-06 – 2023-04-12 (×7): 15 mg via ORAL
  Filled 2023-04-06 (×7): qty 1

## 2023-04-06 NOTE — ED Provider Notes (Signed)
[place] EMERGENCY DEPARTMENT AT [facility] Provider Note   CSN: [id number] Arrival date & time: 04/06/23  7253     History  Chief Complaint  Patient presents with   Shortness of Breath    [name] is a 77 y.o. female.  77 year old female with prior medical history as detailed below presents with her daughter for evaluation.  Patient's daughter provides history.  Patient with increased shortness of breath x 24 hours.  Patient seen in primary care office this morning.  Patient was noted to be hypoxic to 90%.  Patient with exam concerning for possible CHF exacerbation.  Patient is currently compliant with previously prescribed Lasix.  Per daughter, patient is taking 40 mg every morning and 40 mg every evening.  Patient without associated fever.  Mild cough and complaints of shortness of breath started yesterday per daughter.  The history is provided by the patient, medical records and the EMS personnel.       Home Medications Prior to Admission medications   Medication Sig Start Date End Date Taking? Authorizing Provider  acetaminophen (TYLENOL) 500 MG tablet Take 1,000 mg by mouth as needed for moderate pain. Patient not taking: Reported on 03/10/2023    [provider]  acetaminophen-codeine (TYLENOL #3) 300-30 MG tablet TAKE 1 TABLET BY MOUTH EVERY 6 HOURS AS NEEDED 02/18/23   Myrlene Broker, MD  aspirin EC 81 MG tablet Take 1 tablet (81 mg total) by mouth daily. Swallow whole. 10/31/22   Regalado, Belkys A, MD  carvedilol (COREG) 25 MG tablet TAKE 1 TABLET (25 MG TOTAL) BY MOUTH TWICE A DAY WITH MEALS 12/30/22   Myrlene Broker, MD  Cholecalciferol (VITAMIN D) 50 MCG (2000 UT) CAPS Take 2,000 Units by mouth daily.    [provider]  cilostazol (PLETAL) 100 MG tablet TAKE 1 TABLET BY MOUTH TWICE A DAY 01/18/23   Myrlene Broker, MD  cloNIDine (CATAPRES - DOSED IN MG/24 HR) 0.1 mg/24hr patch Place 1 patch (0.1 mg total)  onto the skin once a week. 09/18/22   Dorcas Carrow, MD  cyclobenzaprine (FLEXERIL) 5 MG tablet TAKE 0.5 TABLETS (2.5 MG TOTAL) BY MOUTH AT BEDTIME AS NEEDED FOR MUSCLE SPASMS. 01/18/23   Myrlene Broker, MD  diphenoxylate-atropine (LOMOTIL) 2.5-0.025 MG tablet Take 1 tablet by mouth daily as needed for diarrhea or loose stools. 12/30/22   Myrlene Broker, MD  DULoxetine (CYMBALTA) 20 MG capsule TAKE 1 CAPSULE BY MOUTH EVERY DAY Patient not taking: Reported on 03/10/2023 01/18/23   Myrlene Broker, MD  DULoxetine (CYMBALTA) 30 MG capsule Take 1 capsule (30 mg total) by mouth daily. 11/17/22   Myrlene Broker, MD  ELIQUIS 2.5 MG TABS tablet TAKE 1 TABLET BY MOUTH TWICE A DAY 03/26/23   Myrlene Broker, MD  ferrous sulfate 325 (65 FE) MG tablet Take 1 tablet (325 mg total) by mouth daily with breakfast. Patient not taking: Reported on 03/10/2023 10/31/22   Regalado, Jon Billings A, MD  furosemide (LASIX) 40 MG tablet Take 2 tablets (80 mg total) by mouth 2 (two) times daily. For increase edema 03/10/23   Myrlene Broker, MD  guaiFENesin (MUCINEX) 600 MG 12 hr tablet Take 2 tablets (1,200 mg total) by mouth 2 (two) times daily. Patient not taking: Reported on 03/10/2023 10/31/22   Hartley Barefoot A, MD  hydrALAZINE (APRESOLINE) 100 MG tablet Take 1 pill 3 times a day (total dose 125 mg with 25 mg tablet) 03/10/23  Myrlene Broker, MD  hydrALAZINE (APRESOLINE) 25 MG tablet Give 1 pill TID (1 of 100 mg and 1 of 25 mg tablets 3 times a day for total dose 125 mg TID) Hold if SBP less than 120 03/10/23   Myrlene Broker, MD  ipratropium-albuterol (DUONEB) 0.5-2.5 (3) MG/3ML SOLN Take 3 mLs by nebulization 2 (two) times daily. Patient not taking: Reported on 03/10/2023 10/31/22   Hartley Barefoot A, MD  isosorbide mononitrate (IMDUR) 60 MG 24 hr tablet TAKE 1 TABLET BY MOUTH EVERY DAY 11/10/22   Myrlene Broker, MD  levothyroxine (SYNTHROID) 50 MCG tablet Take 1 tablet (50  mcg total) by mouth daily before breakfast. Additional refill from pcp 10/20/22   Nche, Bonna Gains, NP  LORazepam (ATIVAN) 1 MG tablet TAKE 1 TABLET BY MOUTH 2 TIMES DAILY AS NEEDED FOR ANXIETY. 03/01/23   Myrlene Broker, MD  minoxidil (LONITEN) 2.5 MG tablet Take 1 tablet (2.5 mg total) by mouth 2 (two) times daily. 10/23/22   Myrlene Broker, MD  mirtazapine (REMERON) 15 MG tablet TAKE 1 TABLET BY MOUTH EVERYDAY AT BEDTIME 03/22/23   Myrlene Broker, MD  nitroGLYCERIN (NITROSTAT) 0.4 MG SL tablet Place 1 tablet (0.4 mg total) under the tongue every 5 (five) minutes x 3 doses as needed for chest pain. 10/31/22   Regalado, Belkys A, MD  omeprazole (PRILOSEC) 40 MG capsule Take 1 capsule (40 mg total) by mouth daily. 03/10/23   Myrlene Broker, MD  ondansetron (ZOFRAN) 4 MG tablet TAKE 1 TABLET BY MOUTH EVERY 8 HOURS AS NEEDED FOR NAUSEA AND VOMITING 01/15/23   Myrlene Broker, MD  ondansetron [facility]) 40 MG/20ML SOLN injection Inject 2 mL intramuscularly every 6 hours as needed for nausea or vomiting 03/10/23   Myrlene Broker, MD  rosuvastatin (CRESTOR) 20 MG tablet TAKE 1 TABLET BY MOUTH EVERY DAY 02/18/23   Myrlene Broker, MD  sodium bicarbonate 650 MG tablet Take 650 mg by mouth 3 (three) times daily. 10/01/22   [provider]  SYRINGE-NEEDLE, DISP, 3 ML (B-D INTE[employer]RA SYRINGE) 23G X 1" 3 ML MISC Use with zofran injections 03/10/23   Myrlene Broker, MD  levocetirizine (XYZAL) 5 MG tablet TAKE 1 TABLET(5 MG) BY MOUTH EVERY EVENING 02/21/20 06/10/20  Etta Grandchild, MD      Allergies    Xyzal [levocetirizine dihydrochloride], Augmentin [amoxicillin-pot clavulanate], and Tribenzor [olmesartan-amlodipine-hctz]    Review of Systems   Review of Systems  All other systems reviewed and are negative.   Physical Exam Updated Vital Signs BP (!) 168/79   Pulse 70   Temp 98.2 F (36.8 C) (Oral)   Resp (!) 21   Ht  (1.575 m)   Wt 51.7 kg    SpO2 97%   BMI 20.85 kg/m  Physical Exam Vitals and nursing note reviewed.  Constitutional:      General: She is not in acute distress.    Appearance: Normal appearance. She is well-developed.  HENT:     Head: Normocephalic and atraumatic.  Eyes:     Conjunctiva/sclera: Conjunctivae normal.     Pupils: Pupils are equal, round, and reactive to light.  Neck:     Comments: Tracheal stoma site clean and dry. Cardiovascular:     Rate and Rhythm: Normal rate and regular rhythm.     Heart sounds: Normal heart sounds.  Pulmonary:     Effort: Pulmonary effort is normal. No respiratory distress.     Breath  sounds: Normal breath sounds.  Abdominal:     General: There is no distension.     Palpations: Abdomen is soft.     Tenderness: There is no abdominal tenderness.  Musculoskeletal:        General: No deformity. Normal range of motion.     Cervical back: Normal range of motion and neck supple.  Skin:    General: Skin is warm and dry.  Neurological:     General: No focal deficit present.     Mental Status: She is alert and oriented to person, place, and time.     ED Results / Procedures / Treatments   Labs (all labs ordered are listed, but only abnormal results are displayed) Labs Reviewed  CBC WITH DIFFERENTIAL/PLATELET - Abnormal; Notable for the following components:      Result Value   RBC 2.42 (*)    Hemoglobin 6.7 (*)    HCT 21.5 (*)    All other components within normal limits  COMPREHENSIVE METABOLIC PANEL - Abnormal; Notable for the following components:   Glucose, Bld 122 (*)    BUN 83 (*)    Creatinine, Ser 4.60 (*)    Calcium 8.6 (*)    Albumin 3.4 (*)    GFR, Estimated 9 (*)    All other components within normal limits  BRAIN NATRIURETIC PEPTIDE - Abnormal; Notable for the following components:   B Natriuretic Peptide 850.0 (*)    All other components within normal limits  TROPONIN I (HIGH SENSITIVITY) - Abnormal; Notable for the following components:    Troponin I (High Sensitivity) 544 (*)    All other components within normal limits  SARS CORONAVIRUS 2 BY RT PCR  CULTURE, BLOOD (ROUTINE X 2)  CULTURE, BLOOD (ROUTINE X 2)  LACTIC ACID, PLASMA  BLOOD GAS, VENOUS  LACTIC ACID, PLASMA  TYPE AND SCREEN  TROPONIN I (HIGH SENSITIVITY)    EKG EKG Interpretation  Date/Time:  Tuesday April 06 2023 09:27:38 EDT Ventricular Rate:  75 PR Interval:  166 QRS Duration: 86 QT Interval:  473 QTC Calculation: 529 R Axis:   91 Text Interpretation: Sinus rhythm Anterior infarct, old Nonspecific T abnormalities, lateral leads Confirmed by Kristine Royal [phone]) on 04/06/2023 9:34:15 AM  Radiology DG Chest Port 1 View  Result Date: 04/06/2023 CLINICAL DATA:  Shortness of breath Cough EXAM: PORTABLE CHEST - 1 VIEW COMPARISON:  10/30/2022 FINDINGS: Mild cardiomegaly Mild pulmonary vascular congestion.  Bilateral airspace opacities. IMPRESSION: 1. Mild cardiomegaly. 2. Bilateral airspace opacities likely due to pulmonary edema, however concurrent pneumonia also possible. Electronically Signed   By: Acquanetta Belling M.D.   On: 04/06/2023 10:02    Procedures Procedures    Medications Ordered in ED Medications  furosemide (LASIX) injection 40 mg (has no administration in time range)    ED Course/ Medical Decision Making/ A&P                             Medical Decision Making Amount and/or Complexity of Data Reviewed Labs: ordered. Radiology: ordered.  Risk Prescription drug management.    Medical Screen Complete  This patient presented to the ED with complaint of sob.  This complaint involves an extensive number of treatment options. The initial differential diagnosis includes, but is not limited to, CHF exacerbation, COPD exacerbation, ACS, metabolic abnormality, etc.  This presentation is: Acute, Chronic, Self-Limited, Previously Undiagnosed, Uncertain Prognosis, Complicated, Systemic Symptoms, and Threat to Life/Bodily  Function  Patient presents with complaint of shortness of breath.  Onset over the last 24 hours.  No associated fever or significant cough.  Exam and history is concerning for possible CHF exacerbation.  X-ray is concerning for likely pulmonary edema.  Patient is mildly hypoxic down to 90% on room air.  With supplemental O2 over her tracheal stoma sats are improved to upper 90s.  Patient feels improved.  Notable lab abnormalities on workup include hemoglobin of 6.7, troponin of 544, BUN of 83, creatinine of 4.6.  Patient with known history of renal insufficiency.  Creatinine appears to be at baseline today.    Patient with known history of anemia.  Hemoglobin appears close to baseline.  Patient is compliant with previously prescribed Eliquis.  Patient would benefit from admission.  [facility] cardiology is aware of case and will consult.  Hospitalist service made aware of case and will evaluate for admission  Additional history obtained:  Additional history obtained from [facility] External records from outside sources obtained and reviewed including prior ED visits and prior Inpatient records.    Lab Tests:  I ordered and personally interpreted labs.  The pertinent results include: CBC, CMP, troponin, lactic acid, VBG, COVID   Imaging Studies ordered:  I ordered imaging studies including chest x-ray I independently visualized and interpreted obtained imaging which showed pulmonary edema I agree with the radiologist interpretation.   Cardiac Monitoring:  The patient was maintained on a cardiac monitor.  I personally viewed and interpreted the cardiac monitor which showed an underlying rhythm of: NSR   Medicines ordered:  I ordered medication including Lasix for diuresis Reevaluation of the patient after these medicines showed that the patient: improved    Problem List / ED Course:  Shortness of breath, suspected CHF exacerbation, anemia, chronic renal  insufficiency   Reevaluation:  After the interventions noted above, I reevaluated the patient and found that they have: improved  Disposition:  After consideration of the diagnostic results and the patients response to treatment, I feel that the patent would benefit from admission.   CRITICAL CARE Performed by: Wynetta Fines   Total critical care time: 30 minutes  Critical care time was exclusive of separately billable procedures and treating other patients.  Critical care was necessary to treat or prevent imminent or life-threatening deterioration.  Critical care was time spent personally by me on the following activities: development of treatment plan with patient and/or surrogate as well as nursing, discussions with consultants, evaluation of patient's response to treatment, examination of patient, obtaining history from patient or surrogate, ordering and performing treatments and interventions, ordering and review of laboratory studies, ordering and review of radiographic studies, pulse oximetry and re-evaluation of patient's condition.          Final Clinical Impression(s) / ED Diagnoses Final diagnoses:  Dyspnea, unspecified type  Anemia, unspecified type  Chronic renal impairment, unspecified CKD stage    Rx / DC Orders ED Discharge Orders     None         Wynetta Fines, MD 04/06/23 1130

## 2023-04-06 NOTE — H&P (Addendum)
History and Physical    [name] ZOX:[id number] DOB: [birth year] DOA: 04/06/2023  PCP: Myrlene Broker, MD  Patient coming from: home via PCP's office   Chief Complaint: dyspnea  HPI: [name] is a 77 y.o. female with medical history significant for neck cancer s/p laryngectomy now has ostomy, ckd5, dchf, copd, hx cva, hx dvt, htn, hypothyroid, paroxysmal a fib, pad, cad, who presents with the above.  Several days of complaining of shortness of breath. Went to [employer] office today where o2 was noted to be low normal and referred to our ER. Denies cough, no fever, no increased ostomy secretions. No chest pain or dysuria. No dysphagia or abdominal pain. No recent medication changes. No significant lower extremity swelling.   Review of Systems: As per HPI otherwise 10 point review of systems negative.    Past Medical History:  Diagnosis Date   Anemia    Anxiety    takes Ativan prn   Blood transfusion without reported diagnosis 09/15/12   2 units Prbc's   Broken ribs    Chronic back pain    CKD (chronic kidney disease), stage IV    Constipation    related to pain meds   COPD (chronic obstructive pulmonary disease) 08/10/2012   denies   Depression    Gastrostomy in place    removed   GERD (gastroesophageal reflux disease)    takes Zantac daily   Headache(784.0)    Hiatal hernia 08/10/2012   History of radiation therapy 10/17/12-11/25/12   supraglottic larynx,high risk neck tumor bed 5880 cGy/28 sessions, high risk lymph node tumor bed 5600 cGy/20 sessions, mod risk lymph node tumor bed 5040 cGy/20 sessions   Hypercholesteremia    takes Pravastatin daily   Hypertension    takes Tribenzor and Atenolol daily   Insomnia    takes Amitriptyline daily   Nausea    takes Zofran prn   PAD (peripheral artery disease)    noninvasive imaging in 2016   Pneumonia    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 08/08/2012   Shortness of breath dyspnea    Stroke  2011   denies. no residual   Uterine cancer     Past Surgical History:  Procedure Laterality Date   ABDOMINAL SURGERY     r/t uterine carcinoma   APPENDECTOMY     DIRECT LARYNGOSCOPY N/A 05/22/2014   Procedure: DIRECT LARYNGOSCOPY WITH ESOPHAGEAL DILATION;  Surgeon: Osborn Coho, MD;  Location: [facility] OR;  Service: ENT;  Laterality: N/A;   ESOPHAGOSCOPY WITH DILITATION N/A 05/29/2015   Procedure: ESOPHAGOSCOPY WITH ESOPHAGEAL DILITATION;  Surgeon: Osborn Coho, MD;  Location: [facility] OR;  Service: ENT;  Laterality: N/A;   Gastrostomy Tube removed   2013   GASTROSTOMY W/ FEEDING TUBE  13   HERNIA REPAIR     child   LARYNGETOMY  08/31/2012   Procedure: LARYNGECTOMY;  Surgeon: Osborn Coho, MD;  Location: [facility] OR;  Service: ENT;  Laterality: N/A;   LARYNGOSCOPY  08/10/2012   Procedure: LARYNGOSCOPY;  Surgeon: Osborn Coho, MD;  Location: [facility] ORS;  Service: ENT;  Laterality: N/A;  with biopsy   RADICAL NECK DISSECTION  08/31/2012   Procedure: RADICAL NECK DISSECTION;  Surgeon: Osborn Coho, MD;  Location: [facility] OR;  Service: ENT;  Laterality: Bilateral;   TRACHEAL ESOPHOGEAL PUNCTURE WITH REPAIR STOMA N/A 09/08/2013   Procedure: TRACHEAL ESOPHOGEAL PUNCTURE WITH PLACEMENT OF  PROVOX PROSTHESIS ;  Surgeon: Osborn Coho, MD;  Location: [facility] OR;  Service: ENT;  Laterality: N/A;   TRACHEOSTOMY TUBE PLACEMENT  08/10/2012   Procedure: TRACHEOSTOMY;  Surgeon: Osborn Coho, MD;  Location: [facility] ORS;  Service: ENT;  Laterality: N/A;     reports that she quit smoking about 9 years ago. Her smoking use included cigarettes. She has a 12.50 pack-year smoking history. She has never used smokeless tobacco. She reports that she does not currently use alcohol. She reports that she does not use drugs.  Allergies  Allergen Reactions   Xyzal [Levocetirizine Dihydrochloride] Itching   Augmentin [Amoxicillin-Pot Clavulanate] Diarrhea and Nausea And Vomiting   Tribenzor [Olmesartan-Amlodipine-Hctz] Other (See  Comments)    "Hurt the kidneys"    Family History  Problem Relation Age of Onset   Throat cancer Father    Brain cancer Brother    CAD Neg Hx     Prior to Admission medications   Medication Sig Start Date End Date Taking? Authorizing Provider  acetaminophen (TYLENOL) 500 MG tablet Take 1,000 mg by mouth as needed for moderate pain. Patient not taking: Reported on 03/10/2023    [provider]  acetaminophen-codeine (TYLENOL #3) 300-30 MG tablet TAKE 1 TABLET BY MOUTH EVERY 6 HOURS AS NEEDED 02/18/23   Myrlene Broker, MD  aspirin EC 81 MG tablet Take 1 tablet (81 mg total) by mouth daily. Swallow whole. 10/31/22   Regalado, Belkys A, MD  carvedilol (COREG) 25 MG tablet TAKE 1 TABLET (25 MG TOTAL) BY MOUTH TWICE A DAY WITH MEALS 12/30/22   Myrlene Broker, MD  Cholecalciferol (VITAMIN D) 50 MCG (2000 UT) CAPS Take 2,000 Units by mouth daily.    [provider]  cilostazol (PLETAL) 100 MG tablet TAKE 1 TABLET BY MOUTH TWICE A DAY 01/18/23   Myrlene Broker, MD  cloNIDine (CATAPRES - DOSED IN MG/24 HR) 0.1 mg/24hr patch Place 1 patch (0.1 mg total) onto the skin once a week. 09/18/22   Dorcas Carrow, MD  cyclobenzaprine (FLEXERIL) 5 MG tablet TAKE 0.5 TABLETS (2.5 MG TOTAL) BY MOUTH AT BEDTIME AS NEEDED FOR MUSCLE SPASMS. 01/18/23   Myrlene Broker, MD  diphenoxylate-atropine (LOMOTIL) 2.5-0.025 MG tablet Take 1 tablet by mouth daily as needed for diarrhea or loose stools. 12/30/22   Myrlene Broker, MD  DULoxetine (CYMBALTA) 20 MG capsule TAKE 1 CAPSULE BY MOUTH EVERY DAY Patient not taking: Reported on 03/10/2023 01/18/23   Myrlene Broker, MD  DULoxetine (CYMBALTA) 30 MG capsule Take 1 capsule (30 mg total) by mouth daily. 11/17/22   Myrlene Broker, MD  ELIQUIS 2.5 MG TABS tablet TAKE 1 TABLET BY MOUTH TWICE A DAY 03/26/23   Myrlene Broker, MD  ferrous sulfate 325 (65 FE) MG tablet Take 1 tablet (325 mg total) by mouth daily with  breakfast. Patient not taking: Reported on 03/10/2023 10/31/22   Regalado, Jon Billings A, MD  furosemide (LASIX) 40 MG tablet Take 2 tablets (80 mg total) by mouth 2 (two) times daily. For increase edema 03/10/23   Myrlene Broker, MD  guaiFENesin (MUCINEX) 600 MG 12 hr tablet Take 2 tablets (1,200 mg total) by mouth 2 (two) times daily. Patient not taking: Reported on 03/10/2023 10/31/22   Hartley Barefoot A, MD  hydrALAZINE (APRESOLINE) 100 MG tablet Take 1 pill 3 times a day (total dose 125 mg with 25 mg tablet) 03/10/23   Myrlene Broker, MD  hydrALAZINE (APRESOLINE) 25 MG tablet Give 1 pill TID (1 of 100 mg and 1 of 25 mg tablets 3 times a day  for total dose 125 mg TID) Hold if SBP less than 120 03/10/23   Myrlene Broker, MD  ipratropium-albuterol (DUONEB) 0.5-2.5 (3) MG/3ML SOLN Take 3 mLs by nebulization 2 (two) times daily. Patient not taking: Reported on 03/10/2023 10/31/22   Hartley Barefoot A, MD  isosorbide mononitrate (IMDUR) 60 MG 24 hr tablet TAKE 1 TABLET BY MOUTH EVERY DAY 11/10/22   Myrlene Broker, MD  levothyroxine (SYNTHROID) 50 MCG tablet Take 1 tablet (50 mcg total) by mouth daily before breakfast. Additional refill from pcp 10/20/22   Nche, Bonna Gains, NP  LORazepam (ATIVAN) 1 MG tablet TAKE 1 TABLET BY MOUTH 2 TIMES DAILY AS NEEDED FOR ANXIETY. 03/01/23   Myrlene Broker, MD  minoxidil (LONITEN) 2.5 MG tablet Take 1 tablet (2.5 mg total) by mouth 2 (two) times daily. 10/23/22   Myrlene Broker, MD  mirtazapine (REMERON) 15 MG tablet TAKE 1 TABLET BY MOUTH EVERYDAY AT BEDTIME 03/22/23   Myrlene Broker, MD  nitroGLYCERIN (NITROSTAT) 0.4 MG SL tablet Place 1 tablet (0.4 mg total) under the tongue every 5 (five) minutes x 3 doses as needed for chest pain. 10/31/22   Regalado, Belkys A, MD  omeprazole (PRILOSEC) 40 MG capsule Take 1 capsule (40 mg total) by mouth daily. 03/10/23   Myrlene Broker, MD  ondansetron (ZOFRAN) 4 MG tablet TAKE 1  TABLET BY MOUTH EVERY 8 HOURS AS NEEDED FOR NAUSEA AND VOMITING 01/15/23   Myrlene Broker, MD  ondansetron [facility]) 40 MG/20ML SOLN injection Inject 2 mL intramuscularly every 6 hours as needed for nausea or vomiting 03/10/23   Myrlene Broker, MD  rosuvastatin (CRESTOR) 20 MG tablet TAKE 1 TABLET BY MOUTH EVERY DAY 02/18/23   Myrlene Broker, MD  sodium bicarbonate 650 MG tablet Take 650 mg by mouth 3 (three) times daily. 10/01/22   [provider]  SYRINGE-NEEDLE, DISP, 3 ML (B-D INTEGRA SYRINGE) 23G X 1" 3 ML MISC Use with zofran injections 03/10/23   Myrlene Broker, MD  levocetirizine (XYZAL) 5 MG tablet TAKE 1 TABLET(5 MG) BY MOUTH EVERY EVENING 02/21/20 06/10/20  Etta Grandchild, MD    Physical Exam: Vitals:   04/06/23 1030 04/06/23 1100 04/06/23 1130 04/06/23 1152  BP: (!) 168/79 (!) 181/75 (!) 169/66   Pulse: 70 70 73 74  Resp: (!) Temp:      TempSrc:      SpO2: 97% 95% 97% 92%  Weight:      Height:        Constitutional: No acute distress Head: Atraumatic Eyes: Conjunctiva clear ENM: Moist mucous membranes. Normal dentition.  Neck: ostomy with some mucous Respiratory: faint rales at bases Cardiovascular: Regular rate and rhythm. No murmurs/rubs/gallops. Abdomen: Non-tender, non-distended. No masses. No rebound or guarding. Positive bowel sounds. Musculoskeletal: No joint deformity upper and lower extremities. Normal ROM, no contractures. Normal muscle tone.  Skin: No rashes, lesions, or ulcers.  Extremities: No peripheral edema. Palpable peripheral pulses. Does have some eyelid edema Neurologic: Alert, moving all 4 extremities.   Labs on Admission: I have personally reviewed following labs and imaging studies  CBC: Recent Labs  Lab 04/06/23 0946  WBC 7.0  NEUTROABS 5.5  HGB 6.7*  HCT 21.5*  MCV 88.8  PLT 187   Basic Metabolic Panel: Recent Labs  Lab 04/06/23 0946  NA 140  K 4.5  CL 104  CO2 23  GLUCOSE 122*  BUN  83*  CREATININE 4.60*  CALCIUM 8.6*   GFR: Estimated Creatinine Clearance: 8.1 mL/min (A) (by C-G formula based on SCr of 4.6 mg/dL (H)). Liver Function Tests: Recent Labs  Lab 04/06/23 0946  AST 18  ALT 12  ALKPHOS 69  BILITOT 0.5  PROT 7.1  ALBUMIN 3.4*   No results for input(s): "LIPASE", "AMYLASE" in the last 168 hours. No results for input(s): "AMMONIA" in the last 168 hours. Coagulation Profile: No results for input(s): "INR", "PROTIME" in the last 168 hours. Cardiac Enzymes: No results for input(s): "CKTOTAL", "CKMB", "CKMBINDEX", "TROPONINI" in the last 168 hours. BNP (last 3 results) Recent Labs    10/20/22 0902  PROBNP 106.0*   HbA1C: No results for input(s): "HGBA1C" in the last 72 hours. CBG: No results for input(s): "GLUCAP" in the last 168 hours. Lipid Profile: No results for input(s): "CHOL", "HDL", "LDLCALC", "TRIG", "CHOLHDL", "LDLDIRECT" in the last 72 hours. Thyroid Function Tests: No results for input(s): "TSH", "T4TOTAL", "FREET4", "T3FREE", "THYROIDAB" in the last 72 hours. Anemia Panel: No results for input(s): "VITAMINB12", "FOLATE", "FERRITIN", "TIBC", "IRON", "RETICCTPCT" in the last 72 hours. Urine analysis:    Component Value Date/Time   COLORURINE YELLOW 10/29/2022 0358   APPEARANCEUR CLEAR 10/29/2022 0358   LABSPEC 1.010 10/29/2022 0358   PHURINE 6.0 10/29/2022 0358   GLUCOSEU NEGATIVE 10/29/2022 0358   GLUCOSEU NEGATIVE 08/11/2021 0942   HGBUR NEGATIVE 10/29/2022 0358   BILIRUBINUR NEGATIVE 10/29/2022 0358   BILIRUBINUR Negative 01/19/2019 1510   KETONESUR NEGATIVE 10/29/2022 0358   PROTEINUR >=300 (A) 10/29/2022 0358   UROBILINOGEN 0.2 08/11/2021 0942   NITRITE NEGATIVE 10/29/2022 0358   LEUKOCYTESUR NEGATIVE 10/29/2022 0358    Radiological Exams on Admission: DG Chest Port 1 View  Result Date: 04/06/2023 CLINICAL DATA:  Shortness of breath Cough EXAM: PORTABLE CHEST - 1 VIEW COMPARISON:  10/30/2022 FINDINGS: Mild  cardiomegaly Mild pulmonary vascular congestion.  Bilateral airspace opacities. IMPRESSION: 1. Mild cardiomegaly. 2. Bilateral airspace opacities likely due to pulmonary edema, however concurrent pneumonia also possible. Electronically Signed   By: Acquanetta Belling M.D.   On: 04/06/2023 10:02    EKG: Independently reviewed. nsr  Assessment/Plan Principal Problem:   CHF exacerbation Active Problems:   Hypertension   COPD (chronic obstructive pulmonary disease)   Chronic pain syndrome   CKD (chronic kidney disease) stage 5, GFR less than 15 ml/min   PAD (peripheral artery disease)   Hypothyroidism   History of head and neck cancer-s/p laryngectomy-with tracheostomy   PAF (paroxysmal atrial fibrillation)-CHADS2 Vas score of 5   History of DVT (deep vein thrombosis)   Anemia in CKD (chronic kidney disease)   Alaryngeal voice   History of CVA (cerebrovascular accident)   Chronic diastolic (congestive) heart failure   # CHF with acute exacerbation Preserved EF on TTE last year. CXR with pulmonary edema. Bnp is elevated though this is in the setting of ckd5. Trop also elevated but has baseline tropinemia and no chest pain or ischemic EKG findings, do not think this is ACS. Is compliant with home doac so PE unlikely. No fever or productive cough or focal findings to suggest pneumonia. Covid is negative. No documented hypoxia - received lasix 40 iv in the er, will give another 40 iv, is on 80 oral bid at home - cont home coreg, indur  # Anemia of chronic kidney disease Hgb in the 6s, no s/s of bleeding, this likely contributes to her symptomatology - to avoid volume overload will diurese first, consider blood transfusion tomorrow   #  Hx neck cancer, laryngectomy - ostomy appears to be functioning, no increased secretions - routine ostomy care   # CKD5 Cr 4.6 which is at baseline, no sig acid base or electrolyte abnormality - cont home bicarb  # HTN Here bp elevated - lasix as above -  resume home hydral, coreg, minoxidil, statin  # GAD, MDD - cont home lorazepam, mirtazipine, duloxetine  # Hypothyroid - cont home levo  # PAD - cont home dilostizol  # Hx DVT - cont home apixaban    DVT prophylaxis: apixaban Code Status: dnr, confirmed w/ daughter  Family Communication: daughter-in-law updated telephonically  Consults called: none   Level of care: Med-Surg Status is: Inpatient Remains inpatient appropriate because: need for Iv diuresis    Silvano Bilis MD [facility] Pager [phone]  If 7PM-7AM, please contact night-coverage [email or web address] Password [facility]  04/06/2023, 12:16 PM

## 2023-04-06 NOTE — Assessment & Plan Note (Signed)
Clinically unstable. Low O2 saturation Wheezing and crackles on physical examination Differential diagnosis discussed COPD/CHF exacerbation most likely No signs of pneumonia.  Afebrile. Needs further evaluation in emergency department and possible admission Ambulance called for transportation Findings discussed with EMS personnel on arrival

## 2023-04-06 NOTE — ED Triage Notes (Signed)
Lives at home, coming from dr office via ems. Last night had sudden onset sob/coughing. On RA baseline with tracheal stoma, no trach tube.

## 2023-04-06 NOTE — Progress Notes (Signed)
[name] 77 y.o.   Chief Complaint  Patient presents with   Back Pain    Pain in her lower back started last Wednesday   Shortness of Breath    SOB started yesterday    HISTORY OF PRESENT ILLNESS: This is a 77 y.o. female accompanied by her daughter-in-law complaining of progressive difficulty breathing over the last week Has a history of COPD and CHF History of throat cancer also Also has left-sided lumbar pain with radiculopathy down left lower leg  Back Pain Pertinent negatives include no abdominal pain, chest pain, dysuria, fever or headaches.  Shortness of Breath Pertinent negatives include no abdominal pain, chest pain, fever, headaches, rash, sore throat or vomiting.     Prior to Admission medications   Medication Sig Start Date End Date Taking? Authorizing Provider  acetaminophen-codeine (TYLENOL #3) 300-30 MG tablet TAKE 1 TABLET BY MOUTH EVERY 6 HOURS AS NEEDED 02/18/23  Yes Myrlene Broker, MD  aspirin EC 81 MG tablet Take 1 tablet (81 mg total) by mouth daily. Swallow whole. 10/31/22  Yes Regalado, Belkys A, MD  carvedilol (COREG) 25 MG tablet TAKE 1 TABLET (25 MG TOTAL) BY MOUTH TWICE A DAY WITH MEALS 12/30/22  Yes Myrlene Broker, MD  Cholecalciferol (VITAMIN D) 50 MCG (2000 UT) CAPS Take 2,000 Units by mouth daily.   Yes [provider]  cilostazol (PLETAL) 100 MG tablet TAKE 1 TABLET BY MOUTH TWICE A DAY 01/18/23  Yes Myrlene Broker, MD  cloNIDine (CATAPRES - DOSED IN MG/24 HR) 0.1 mg/24hr patch Place 1 patch (0.1 mg total) onto the skin once a week. 09/18/22  Yes Ghimire, Lyndel Safe, MD  cyclobenzaprine (FLEXERIL) 5 MG tablet TAKE 0.5 TABLETS (2.5 MG TOTAL) BY MOUTH AT BEDTIME AS NEEDED FOR MUSCLE SPASMS. 01/18/23  Yes Myrlene Broker, MD  diphenoxylate-atropine (LOMOTIL) 2.5-0.025 MG tablet Take 1 tablet by mouth daily as needed for diarrhea or loose stools. 12/30/22  Yes Myrlene Broker, MD  DULoxetine (CYMBALTA) 30 MG capsule  Take 1 capsule (30 mg total) by mouth daily. 11/17/22  Yes Myrlene Broker, MD  ELIQUIS 2.5 MG TABS tablet TAKE 1 TABLET BY MOUTH TWICE A DAY 03/26/23  Yes Myrlene Broker, MD  furosemide (LASIX) 40 MG tablet Take 2 tablets (80 mg total) by mouth 2 (two) times daily. For increase edema 03/10/23  Yes Myrlene Broker, MD  hydrALAZINE (APRESOLINE) 100 MG tablet Take 1 pill 3 times a day (total dose 125 mg with 25 mg tablet) 03/10/23  Yes Myrlene Broker, MD  hydrALAZINE (APRESOLINE) 25 MG tablet Give 1 pill TID (1 of 100 mg and 1 of 25 mg tablets 3 times a day for total dose 125 mg TID) Hold if SBP less than 120 03/10/23  Yes Myrlene Broker, MD  isosorbide mononitrate (IMDUR) 60 MG 24 hr tablet TAKE 1 TABLET BY MOUTH EVERY DAY 11/10/22  Yes Myrlene Broker, MD  levothyroxine (SYNTHROID) 50 MCG tablet Take 1 tablet (50 mcg total) by mouth daily before breakfast. Additional refill from pcp 10/20/22  Yes Nche, Bonna Gains, NP  LORazepam (ATIVAN) 1 MG tablet TAKE 1 TABLET BY MOUTH 2 TIMES DAILY AS NEEDED FOR ANXIETY. 03/01/23  Yes Myrlene Broker, MD  minoxidil (LONITEN) 2.5 MG tablet Take 1 tablet (2.5 mg total) by mouth 2 (two) times daily. 10/23/22  Yes Myrlene Broker, MD  mirtazapine (REMERON) 15 MG tablet TAKE 1 TABLET BY MOUTH EVERYDAY AT BEDTIME 03/22/23  Yes Myrlene Broker, MD  nitroGLYCERIN (NITROSTAT) 0.4 MG SL tablet Place 1 tablet (0.4 mg total) under the tongue every 5 (five) minutes x 3 doses as needed for chest pain. 10/31/22  Yes Regalado, Belkys A, MD  omeprazole (PRILOSEC) 40 MG capsule Take 1 capsule (40 mg total) by mouth daily. 03/10/23  Yes Myrlene Broker, MD  ondansetron (ZOFRAN) 4 MG tablet TAKE 1 TABLET BY MOUTH EVERY 8 HOURS AS NEEDED FOR NAUSEA AND VOMITING 01/15/23  Yes Myrlene Broker, MD  ondansetron [facility]) 40 MG/20ML SOLN injection Inject 2 mL intramuscularly every 6 hours as needed for nausea or vomiting 03/10/23  Yes  Myrlene Broker, MD  rosuvastatin (CRESTOR) 20 MG tablet TAKE 1 TABLET BY MOUTH EVERY DAY 02/18/23  Yes Myrlene Broker, MD  sodium bicarbonate 650 MG tablet Take 650 mg by mouth 3 (three) times daily. 10/01/22  Yes [provider]  SYRINGE-NEEDLE, DISP, 3 ML (B-D INTEGRA SYRINGE) 23G X 1" 3 ML MISC Use with zofran injections 03/10/23  Yes Myrlene Broker, MD  acetaminophen (TYLENOL) 500 MG tablet Take 1,000 mg by mouth as needed for moderate pain. Patient not taking: Reported on 03/10/2023    [provider]  DULoxetine (CYMBALTA) 20 MG capsule TAKE 1 CAPSULE BY MOUTH EVERY DAY Patient not taking: Reported on 03/10/2023 01/18/23   Myrlene Broker, MD  ferrous sulfate 325 (65 FE) MG tablet Take 1 tablet (325 mg total) by mouth daily with breakfast. Patient not taking: Reported on 03/10/2023 10/31/22   Regalado, Jon Billings A, MD  guaiFENesin (MUCINEX) 600 MG 12 hr tablet Take 2 tablets (1,200 mg total) by mouth 2 (two) times daily. Patient not taking: Reported on 03/10/2023 10/31/22   Regalado, Jon Billings A, MD  ipratropium-albuterol (DUONEB) 0.5-2.5 (3) MG/3ML SOLN Take 3 mLs by nebulization 2 (two) times daily. Patient not taking: Reported on 03/10/2023 10/31/22   Hartley Barefoot A, MD  levocetirizine (XYZAL) 5 MG tablet TAKE 1 TABLET(5 MG) BY MOUTH EVERY EVENING 02/21/20 06/10/20  Etta Grandchild, MD    Allergies  Allergen Reactions   Xyzal [Levocetirizine Dihydrochloride] Itching   Augmentin [Amoxicillin-Pot Clavulanate] Diarrhea and Nausea And Vomiting   Tribenzor [Olmesartan-Amlodipine-Hctz] Other (See Comments)    "Hurt the kidneys"    Patient Active Problem List   Diagnosis Date Noted   Vision loss 11/17/2022   Pain, eye, left 11/17/2022   Chronic diastolic (congestive) heart failure 09/02/2022   Alaryngeal voice 08/13/2022   History of CVA (cerebrovascular accident) 08/13/2022   Facial swelling 01/30/2022   History of DVT (deep vein thrombosis)  01/05/2022   Anemia in CKD (chronic kidney disease) 01/05/2022   Demand ischemia    History of head and neck cancer-s/p laryngectomy-with tracheostomy 01/02/2022   PAF (paroxysmal atrial fibrillation)-CHADS2 Vas score of 5 01/02/2022   Neck pain 07/11/2021   Aortic atherosclerosis 04/09/2021   Moderate protein-calorie malnutrition 04/09/2021   Low back pain 01/23/2021   HLD (hyperlipidemia) 01/13/2021   Hypothyroidism 02/25/2020   Senile osteoporosis 02/15/2020   PAD (peripheral artery disease) 02/15/2020   Tinea corporis 02/15/2020   Gastroesophageal reflux disease without esophagitis 09/09/2019   Osteopenia 01/19/2019   CKD (chronic kidney disease) stage 5, GFR less than 15 ml/min 01/19/2019   Diarrhea 11/04/2018   Thiamine deficiency 01/02/2018   Prediabetes 12/28/2017   Dietary folate deficiency anemia 12/28/2017   Moderate episode of recurrent major depressive disorder 10/22/2017   S/P laryngectomy 11/03/2016   Stenosis of tracheal stoma 06/10/2016  Diverticulum of pharynx 08/21/2015   Esophageal obstruction 07/17/2015   Esophageal stricture 07/17/2015   Major depression, chronic 02/28/2015   Osteoporosis 02/01/2015   Dysphagia, pharyngoesophageal phase 05/22/2014   History of radiation therapy    Insomnia 02/07/2013   History of uterine cancer 11/24/2012   Chronic pain syndrome 09/03/2012   COPD (chronic obstructive pulmonary disease) 08/10/2012   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 08/08/2012    Class: Chronic   Hypertension    GAD (generalized anxiety disorder)     Past Medical History:  Diagnosis Date   Anemia    Anxiety    takes Ativan prn   Blood transfusion without reported diagnosis 09/15/12   2 units Prbc's   Broken ribs    Chronic back pain    CKD (chronic kidney disease), stage IV (HCC)    Constipation    related to pain meds   COPD (chronic obstructive pulmonary disease) (HCC) 08/10/2012   denies   Depression    Gastrostomy in  place [facility])    removed   GERD (gastroesophageal reflux disease)    takes Zantac daily   Headache(784.0)    Hiatal hernia 08/10/2012   History of radiation therapy 10/17/12-11/25/12   supraglottic larynx,high risk neck tumor bed 5880 cGy/28 sessions, high risk lymph node tumor bed 5600 cGy/20 sessions, mod risk lymph node tumor bed 5040 cGy/20 sessions   Hypercholesteremia    takes Pravastatin daily   Hypertension    takes Tribenzor and Atenolol daily   Insomnia    takes Amitriptyline daily   Nausea    takes Zofran prn   PAD (peripheral artery disease) (HCC)    noninvasive imaging in 2016   Pneumonia    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 08/08/2012   Shortness of breath dyspnea    Stroke [facility]) 2011   denies. no residual   Uterine cancer [facility])     Past Surgical History:  Procedure Laterality Date   ABDOMINAL SURGERY     r/t uterine carcinoma   APPENDECTOMY     DIRECT LARYNGOSCOPY N/A 05/22/2014   Procedure: DIRECT LARYNGOSCOPY WITH ESOPHAGEAL DILATION;  Surgeon: Osborn Coho, MD;  Location: [facility] OR;  Service: ENT;  Laterality: N/A;   ESOPHAGOSCOPY WITH DILITATION N/A 05/29/2015   Procedure: ESOPHAGOSCOPY WITH ESOPHAGEAL DILITATION;  Surgeon: Osborn Coho, MD;  Location: [facility] OR;  Service: ENT;  Laterality: N/A;   Gastrostomy Tube removed   2013   GASTROSTOMY W/ FEEDING TUBE  12   HERNIA REPAIR     child   LARYNGETOMY  08/31/2012   Procedure: LARYNGECTOMY;  Surgeon: Osborn Coho, MD;  Location[facility] OR;  Service: ENT;  Laterality: N/A;   LARYNGOSCOPY  08/10/2012   Procedure: LARYNGOSCOPY;  Surgeon: Osborn Coho, MD;  Location: [facility] ORS;  Service: ENT;  Laterality: N/A;  with biopsy   RADICAL NECK DISSECTION  08/31/2012   Procedure: RADICAL NECK DISSECTION;  Surgeon: Osborn Coho, MD;  Location[facility] OR;  Service: ENT;  Laterality: Bilateral;   TRACHEAL ESOPHOGEAL PUNCTURE WITH REPAIR STOMA N/A 09/08/2013   Procedure: TRACHEAL ESOPHOGEAL PUNCTURE WITH PLACEMENT OF   PROVOX PROSTHESIS ;  Surgeon: Osborn Coho, MD;  Location: [facility] OR;  Service: ENT;  Laterality: N/A;   TRACHEOSTOMY TUBE PLACEMENT  08/10/2012   Procedure: TRACHEOSTOMY;  Surgeon: Osborn Coho, MD;  Location: [facility] ORS;  Service: ENT;  Laterality: N/A;    Social History   Socioeconomic History   Marital status: Widowed    Spouse name: Not  on file   Number of children: 4   Years of education: Not on file   Highest education level: Not on file  Occupational History    Comment: retired Sports administrator  Tobacco Use   Smoking status: Former    Packs/day: 0.25    Years: 50.00    Additional pack years: 0.00    Total pack years: 12.50    Types: Cigarettes    Quit date: 05/27/2013    Years since quitting: 9.8   Smokeless tobacco: Never  Vaping Use   Vaping Use: Never used  Substance and Sexual Activity   Alcohol use: Not Currently   Drug use: No   Sexual activity: Not Currently  Other Topics Concern   Not on file  Social History Narrative   ** Merged History Encounter **       Social Determinants of Health   Financial Resource Strain: Low Risk  (10/23/2022)   Overall Financial Resource Strain (CARDIA)    Difficulty of Paying Living Expenses: Not hard at all  Food Insecurity: No Food Insecurity (10/23/2022)   Hunger Vital Sign    Worried About Running Out of Food in the Last Year: Never true    Ran Out of Food in the Last Year: Never true  Transportation Needs: No Transportation Needs (10/23/2022)   PRAPARE - Administrator, Civil Service (Medical): No    Lack of Transportation (Non-Medical): No  Physical Activity: Inactive (10/23/2022)   Exercise Vital Sign    Days of Exercise per Week: 0 days    Minutes of Exercise per Session: 0 min  Stress: No Stress Concern Present (10/23/2022)   [employer] of Occupational Health - Occupational Stress Questionnaire    Feeling of Stress : Not at all  Social Connections: Socially Isolated (10/23/2022)   Social  Connection and Isolation Panel [NHANES]    Frequency of Communication with Friends and Family: Once a week    Frequency of Social Gatherings with Friends and Family: Once a week    Attends Religious Services: More than 4 times per year    Active Member of [employer] or Organizations: No    Attends Banker Meetings: Never    Marital Status: Widowed  Intimate Partner Violence: Patient Declined (10/23/2022)   Humiliation, Afraid, Rape, and Kick questionnaire    Fear of Current or Ex-Partner: Patient declined    Emotionally Abused: Patient declined    Physically Abused: Patient declined    Sexually Abused: Patient declined    Family History  Problem Relation Age of Onset   Throat cancer Father    Brain cancer Brother    CAD Neg Hx      Review of Systems  Constitutional: Negative.  Negative for chills and fever.  HENT:  Negative for congestion and sore throat.   Respiratory:  Positive for shortness of breath.   Cardiovascular: Negative.  Negative for chest pain and palpitations.  Gastrointestinal:  Negative for abdominal pain, diarrhea, nausea and vomiting.  Genitourinary:  Negative for dysuria and hematuria.  Musculoskeletal:  Positive for back pain.  Skin: Negative.  Negative for rash.  Neurological: Negative.  Negative for dizziness and headaches.  All other systems reviewed and are negative.   Today's Vitals   04/06/23 0808  BP: (!) 160/74  Pulse: 80  Temp: 98 F (36.7 C)  TempSrc: Oral  SpO2: 91%  Weight: 114 lb (51.7 kg)  Height:  (1.575 m)   Body mass index is 20.85 kg/m.  Physical Exam Constitutional:      Appearance: Normal appearance.  HENT:     Head: Normocephalic.  Eyes:     Extraocular Movements: Extraocular movements intact.  Neck:     Comments: Tracheostomy stoma visible Cardiovascular:     Rate and Rhythm: Normal rate and regular rhythm.     Heart sounds: Normal heart sounds.  Pulmonary:     Effort: Respiratory distress present.      Breath sounds: Stridor present. Rhonchi and rales present.  Musculoskeletal:     Right lower leg: No edema.     Left lower leg: No edema.  Skin:    General: Skin is warm and dry.     Capillary Refill: Capillary refill takes less than 2 seconds.  Neurological:     General: No focal deficit present.     Mental Status: She is alert.  Psychiatric:        Mood and Affect: Mood normal.        Behavior: Behavior normal.      ASSESSMENT & PLAN: A total of 47 minutes was spent with the patient and counseling/coordination of care regarding preparing for this visit, review of most recent office visit notes, review of multiple chronic medical conditions under management, review of all medications, respiratory assessment and need for further evaluation in emergency department and possible hospital admission, need for ambulance transportation, prognosis, documentation and need for follow-up.  Problem List Items Addressed This Visit       Respiratory   COPD (chronic obstructive pulmonary disease)     Musculoskeletal and Integument   Spondylosis of thoracolumbar region without myelopathy or radiculopathy     Other   Acute respiratory distress - Primary    Clinically unstable. Low O2 saturation Wheezing and crackles on physical examination Differential diagnosis discussed COPD/CHF exacerbation most likely No signs of pneumonia.  Afebrile. Needs further evaluation in emergency department and possible admission Ambulance called for transportation Findings discussed with EMS personnel on arrival      To the emergency department now transported by ambulance. Patient Instructions  Insuficiencia respiratoria aguda en los adultos Acute Respiratory Failure, Adult La insuficiencia respiratoria aguda sucede cuando ocurre una de estas cosas o ambas: El oxgeno no puede pasar de los pulmones a la sangre, lo que hace que el nivel de oxgeno en sangre disminuya. La prdida de oxgeno en sangre  puede hacer que los tejidos y rganos no funcionen bien. Un gas llamado dixido de carbono no puede pasar de la sangre a los pulmones para que el cuerpo pueda eliminarlo. La acumulacin de dixido de carbono puede daar los tejidos y rganos del cuerpo. La insuficiencia respiratoria aguda se presenta rpido. Constituye una emergencia y debe tratarse de inmediato. Cules son las causas? Las causas frecuentes de insuficiencia respiratoria que pueden causar bajos niveles de oxgeno son, entre otras: Traumatismo en el pulmn, el pecho o las [place], o en los tejidos que rodean el pulmn. Enfermedades pulmonares, como neumona, asma o cogulos de sangre en los pulmones (embolia pulmonar). La inhalacin de sustancias qumicas nocivas, humo, agua o vmito. Una infeccin generalizada (septicemia). Infarto de miocardio. Las causas frecuentes de insuficiencia respiratoria que causan una acumulacin de dixido de carbono son, Eusebio Me otras: Accidente cerebrovascular. Lesin en la mdula espinal. Sobredosis de drogas o alcohol. Sepsis. Detencin repentina del corazn (paro cardaco). Qu incrementa el riesgo? Es ms probable que Dietitian en las personas que tienen: Diagnstico de enfermedades pulmonares, como asma o enfermedad pulmonar obstructiva crnica (  EPOC). Una afeccin que daa o [employer], los nervios, los huesos o los tejidos que intervienen en la respiracin, como miastenia grave o el sndrome de Pension scheme manager. Un problema de salud que bloquea el reflejo inconsciente que est involucrado en la respiracin, como el hipotiroidismo o la apnea del sueo. Cules son los signos o sntomas? Los sntomas pueden depender de la causa y de los [place] de oxgeno y dixido de Location manager.  La dificultad para respirar es el sntoma principal de la insuficiencia respiratoria aguda. Otros sntomas pueden incluir: Respiracin acelerada. Hacer sonidos agudos como un  silbido al respirar (sibilancias) y gruidos. Confusin o cambios en la conducta. Cansancio, dormir ms de lo normal o tener dificultad para despertarse. Piel, labios o yemas de los dedos de color azulado (cianosis). Sentir inquietud o ansiedad. Latidos cardacos acelerados o irregulares (palpitaciones). Cmo se diagnostica? Esta afeccin se puede diagnosticar en funcin de lo siguiente: Los antecedentes mdicos y un examen fsico. El mdico le auscultar el corazn y los pulmones para determinar si presenta sonidos anormales. Pruebas para confirmar el diagnstico y Clinical research associate la causa de la insuficiencia respiratoria. Las pruebas pueden incluir las siguientes: Medicin de la cantidad de oxgeno en la sangre (oximetra de pulso). Esto implica colocar un aparato pequeo en un dedo de la mano o del pie o en el lbulo de la oreja. Anlisis de sangre para [employer] de oxgeno y dixido de Location manager. Radiografa de trax. Cmo se trata? El tratamiento para esta afeccin suele realizarse en la unidad de cuidados intensivos (UCI) de un hospital. El tratamiento depende de la causa. El tratamiento puede incluir uno o ms de los siguientes: Administracin de oxgeno a travs de una mascarilla o por la Clinical cytogeneticist. Un dispositivo para ayudarlo a respirar, tal como una mquina de presin positiva continua de las vas areas (CPAP) o de bipresin positiva de las vas areas (BIPAP). El dispositivo le administra oxgeno y presin. Otros tratamientos respiratorios, lquidos y medicamentos. Una mquina para respirar llamada respirador. Esta le proporciona oxgeno y presin para ayudarlo a Industrial/product designer. Se coloca un tubo en la boca que llega hasta la trquea y se lo conecta al respirador. Procedimiento de traqueostoma, si tiene que estar conectado al respirador [employer]. Una traqueostoma es la colocacin de un tubo para respirar a travs del cuello en la trquea. Siga estas instrucciones en su  casa: Medicamentos Use los medicamentos de venta libre y los recetados solamente como se lo haya indicado el mdico. Si le recetaron antibiticos, tmelos como se lo haya indicado el mdico. No deje de usar el antibitico aunque comience a Actor. Instrucciones generales Retome sus actividades normales segn lo indicado por el mdico. Pregntele al mdico qu actividades son seguras para usted. No consuma ningn producto que contenga nicotina o tabaco. Estos productos incluyen cigarrillos, tabaco para Theatre manager y aparatos de vapeo, como los Administrator, Civil Service. Si necesita ayuda para dejar de fumar, consulte al mdico. Concurra a todas las visitas de seguimiento. Comunquese con un mdico si: Los sntomas no mejoran o empeoran. Solicite ayuda de inmediato si: Presenta dificultades respiratorias sbitas. Pierde la conciencia. Su corazn comienza a Equities trader. Los dedos de [employer], los labios u otras zonas del cuerpo se le ponen de color [place]. Se siente confundido. Estos sntomas pueden Customer service manager. Solicite ayuda de inmediato. Llame al 911. No espere a ver si los sntomas desaparecen. No conduzca por sus propios medios [employer] hospital. Resumen La insuficiencia respiratoria  aguda es una afeccin que se desarrolla rpidamente y debe ser tratada de inmediato. El sntoma principal de esta afeccin es la dificultad para Industrial/product designer. El tratamiento para esta afeccin suele realizarse en la unidad de cuidados intensivos ([place]) de un hospital. El tratamiento de emergencia puede incluir oxgeno, lquidos y Media planner. Es posible que se utilice una mquina para ayudarlo a Industrial/product designer, Engineer, maintenance (IT). Comunquese con un mdico si los sntomas no mejoran o si empeoran. Esta informacin no tiene Theme park manager el consejo del mdico. Asegrese de hacerle al mdico cualquier pregunta que tenga. Document Revised: 03/03/2022 Document Reviewed: 03/03/2022 Elsevier  Patient Education  2023 Elsevier Inc.    Edwina Barth, MD [facility]

## 2023-04-06 NOTE — Plan of Care (Signed)

## 2023-04-06 NOTE — Patient Instructions (Signed)
Insuficiencia respiratoria aguda en los adultos Acute Respiratory Failure, Adult La insuficiencia respiratoria aguda sucede cuando ocurre una de estas cosas o ambas: El oxgeno no puede pasar de los pulmones a la sangre, lo que hace que el nivel de oxgeno en sangre disminuya. La prdida de oxgeno en sangre puede hacer que los tejidos y rganos no funcionen bien. Un gas llamado dixido de carbono no puede pasar de la sangre a los pulmones para que el cuerpo pueda eliminarlo. La acumulacin de dixido de carbono puede daar los tejidos y rganos del cuerpo. La insuficiencia respiratoria aguda se presenta rpido. Constituye una emergencia y debe tratarse de inmediato. Cules son las causas? Las causas frecuentes de insuficiencia respiratoria que pueden causar bajos niveles de oxgeno son, entre otras: Traumatismo en el pulmn, el pecho o las [place], o en los tejidos que rodean el pulmn. Enfermedades pulmonares, como neumona, asma o cogulos de sangre en los pulmones (embolia pulmonar). La inhalacin de sustancias qumicas nocivas, humo, agua o vmito. Una infeccin generalizada (septicemia). Infarto de miocardio. Las causas frecuentes de insuficiencia respiratoria que causan una acumulacin de dixido de carbono son, Eusebio Me otras: Accidente cerebrovascular. Lesin en la mdula espinal. Sobredosis de drogas o alcohol. Sepsis. Detencin repentina del corazn (paro cardaco). Qu incrementa el riesgo? Es ms probable que Dietitian en las personas que tienen: Diagnstico de enfermedades pulmonares, como asma o enfermedad pulmonar obstructiva crnica (EPOC). Una afeccin que daa o [employer], los nervios, los huesos o los tejidos que intervienen en la respiracin, como miastenia grave o el sndrome de Pension scheme manager. Un problema de salud que bloquea el reflejo inconsciente que est involucrado en la respiracin, como el hipotiroidismo o la apnea del  sueo. Cules son los signos o sntomas? Los sntomas pueden depender de la causa y de los [place] de oxgeno y dixido de Location manager.  La dificultad para respirar es el sntoma principal de la insuficiencia respiratoria aguda. Otros sntomas pueden incluir: Respiracin acelerada. Hacer sonidos agudos como un silbido al respirar (sibilancias) y gruidos. Confusin o cambios en la conducta. Cansancio, dormir ms de lo normal o tener dificultad para despertarse. Piel, labios o yemas de los dedos de color azulado (cianosis). Sentir inquietud o ansiedad. Latidos cardacos acelerados o irregulares (palpitaciones). Cmo se diagnostica? Esta afeccin se puede diagnosticar en funcin de lo siguiente: Los antecedentes mdicos y un examen fsico. El mdico le auscultar el corazn y los pulmones para determinar si presenta sonidos anormales. Pruebas para confirmar el diagnstico y Clinical research associate la causa de la insuficiencia respiratoria. Las pruebas pueden incluir las siguientes: Medicin de la cantidad de oxgeno en la sangre (oximetra de pulso). Esto implica colocar un aparato pequeo en un dedo de la mano o del pie o en el lbulo de la oreja. Anlisis de sangre para [employer] de oxgeno y dixido de Location manager. Radiografa de trax. Cmo se trata? El tratamiento para esta afeccin suele realizarse en la unidad de cuidados intensivos (UCI) de un hospital. El tratamiento depende de la causa. El tratamiento puede incluir uno o ms de los siguientes: Administracin de oxgeno a travs de una mascarilla o por la Clinical cytogeneticist. Un dispositivo para ayudarlo a respirar, tal como una mquina de presin positiva continua de las vas areas (CPAP) o de bipresin positiva de las vas areas (BIPAP). El dispositivo le administra oxgeno y presin. Otros tratamientos respiratorios, lquidos y medicamentos. Una mquina para respirar llamada respirador. Esta le proporciona oxgeno y presin para  ayudarlo  a respirar. Se coloca un tubo en la boca que llega hasta la trquea y se lo conecta al respirador. Procedimiento de traqueostoma, si tiene que estar conectado al respirador [employer]. Una traqueostoma es la colocacin de un tubo para respirar a travs del cuello en la trquea. Siga estas instrucciones en su casa: Medicamentos Use los medicamentos de venta libre y los recetados solamente como se lo haya indicado el mdico. Si le recetaron antibiticos, tmelos como se lo haya indicado el mdico. No deje de usar el antibitico aunque comience a Actor. Instrucciones generales Retome sus actividades normales segn lo indicado por el mdico. Pregntele al mdico qu actividades son seguras para usted. No consuma ningn producto que contenga nicotina o tabaco. Estos productos incluyen cigarrillos, tabaco para Theatre manager y aparatos de vapeo, como los Administrator, Civil Service. Si necesita ayuda para dejar de fumar, consulte al mdico. Concurra a todas las visitas de seguimiento. Comunquese con un mdico si: Los sntomas no mejoran o empeoran. Solicite ayuda de inmediato si: Presenta dificultades respiratorias sbitas. Pierde la conciencia. Su corazn comienza a Equities trader. Los dedos de [employer], los labios u otras zonas del cuerpo se le ponen de color [place]. Se siente confundido. Estos sntomas pueden Customer service manager. Solicite ayuda de inmediato. Llame al 911. No espere a ver si los sntomas desaparecen. No conduzca por sus propios medios [employer] hospital. Resumen La insuficiencia respiratoria aguda es una afeccin que se desarrolla rpidamente y debe ser tratada de inmediato. El sntoma principal de esta afeccin es la dificultad para Industrial/product designer. El tratamiento para esta afeccin suele realizarse en la unidad de cuidados intensivos ([place]) de un hospital. El tratamiento de emergencia puede incluir oxgeno, lquidos y Media planner. Es posible que se utilice una  mquina para ayudarlo a Industrial/product designer, Engineer, maintenance (IT). Comunquese con un mdico si los sntomas no mejoran o si empeoran. Esta informacin no tiene Theme park manager el consejo del mdico. Asegrese de hacerle al mdico cualquier pregunta que tenga. Document Revised: 03/03/2022 Document Reviewed: 03/03/2022 Elsevier Patient Education  2023 [employer].

## 2023-04-06 NOTE — ED Notes (Signed)
Critical troponin 544

## 2023-04-06 NOTE — ED Notes (Signed)
ED Provider at bedside. 

## 2023-04-06 NOTE — ED Notes (Signed)
Stoma suctioned per patient request

## 2023-04-07 ENCOUNTER — Inpatient Hospital Stay (HOSPITAL_COMMUNITY): Payer: Medicare Other

## 2023-04-07 DIAGNOSIS — I5033 Acute on chronic diastolic (congestive) heart failure: Secondary | ICD-10-CM

## 2023-04-07 LAB — BPAM RBC
Blood Product Expiration Date: 202405312359
Unit Type and Rh: 5100

## 2023-04-07 LAB — BASIC METABOLIC PANEL
Anion gap: 15 (ref 5–15)
BUN: 81 mg/dL — ABNORMAL HIGH (ref 8–23)
CO2: 22 mmol/L (ref 22–32)
Calcium: 8.8 mg/dL — ABNORMAL LOW (ref 8.9–10.3)
Chloride: 104 mmol/L (ref 98–111)
Creatinine, Ser: 4.68 mg/dL — ABNORMAL HIGH (ref 0.44–1.00)
GFR, Estimated: 9 mL/min — ABNORMAL LOW (ref >60)
Glucose, Bld: 111 mg/dL — ABNORMAL HIGH (ref 70–99)
Potassium: 3.8 mmol/L (ref 3.5–5.1)
Sodium: 141 mmol/L (ref 135–145)

## 2023-04-07 LAB — CBC
HCT: 19.3 % — ABNORMAL LOW (ref 36.0–46.0)
Hemoglobin: 6 g/dL — CL (ref 12.0–15.0)
MCH: 27.6 pg (ref 26.0–34.0)
MCHC: 31.1 g/dL (ref 30.0–36.0)
MCV: 88.9 fL (ref 80.0–100.0)
Platelets: 189 10*3/uL (ref 150–400)
RBC: 2.17 MIL/uL — ABNORMAL LOW (ref 3.87–5.11)
RDW: 15.5 % (ref 11.5–15.5)
WBC: 7.5 10*3/uL (ref 4.0–10.5)
nRBC: 0 % (ref 0.0–0.2)

## 2023-04-07 LAB — PREPARE RBC (CROSSMATCH)

## 2023-04-07 MED ORDER — ROSUVASTATIN CALCIUM 20 MG PO TABS
20.0000 mg | ORAL_TABLET | Freq: Every day | ORAL | Status: DC
  Administered 2023-04-07 – 2023-04-13 (×7): 20 mg via ORAL
  Filled 2023-04-07 (×7): qty 1

## 2023-04-07 MED ORDER — ONDANSETRON HCL 4 MG/2ML IJ SOLN
4.0000 mg | Freq: Four times a day (QID) | INTRAMUSCULAR | Status: DC | PRN
  Administered 2023-04-07 – 2023-04-13 (×3): 4 mg via INTRAVENOUS
  Filled 2023-04-07 (×3): qty 2

## 2023-04-07 MED ORDER — FUROSEMIDE 10 MG/ML IJ SOLN
40.0000 mg | Freq: Once | INTRAMUSCULAR | Status: AC
  Administered 2023-04-07: 40 mg via INTRAVENOUS
  Filled 2023-04-07: qty 4

## 2023-04-07 MED ORDER — CLONIDINE HCL 0.1 MG/24HR TD PTWK: 0.1000 mg | MEDICATED_PATCH | TRANSDERMAL | Status: DC

## 2023-04-07 MED ORDER — DULOXETINE HCL 30 MG PO CPEP
30.0000 mg | ORAL_CAPSULE | Freq: Every day | ORAL | Status: DC
  Administered 2023-04-07 – 2023-04-13 (×7): 30 mg via ORAL
  Filled 2023-04-07 (×7): qty 1

## 2023-04-07 MED ORDER — IPRATROPIUM-ALBUTEROL 0.5-2.5 (3) MG/3ML IN SOLN
3.0000 mL | RESPIRATORY_TRACT | Status: DC | PRN
  Administered 2023-04-07: 3 mL via RESPIRATORY_TRACT
  Filled 2023-04-07: qty 3

## 2023-04-07 MED ORDER — SODIUM CHLORIDE 3 % IN NEBU
4.0000 mL | INHALATION_SOLUTION | Freq: Two times a day (BID) | RESPIRATORY_TRACT | Status: DC
  Filled 2023-04-07: qty 4

## 2023-04-07 MED ORDER — MINOXIDIL 2.5 MG PO TABS
2.5000 mg | ORAL_TABLET | Freq: Two times a day (BID) | ORAL | Status: DC
  Administered 2023-04-07 – 2023-04-09 (×6): 2.5 mg via ORAL
  Filled 2023-04-07 (×7): qty 1

## 2023-04-07 MED ORDER — SODIUM CHLORIDE 3 % IN NEBU: 4.0000 mL | INHALATION_SOLUTION | Freq: Two times a day (BID) | RESPIRATORY_TRACT | Status: DC | PRN

## 2023-04-07 MED ORDER — SODIUM CHLORIDE 0.9% IV SOLUTION: Freq: Once | INTRAVENOUS | Status: AC

## 2023-04-07 MED ORDER — IPRATROPIUM-ALBUTEROL 0.5-2.5 (3) MG/3ML IN SOLN
RESPIRATORY_TRACT | Status: AC
  Administered 2023-04-07: 3 mL via RESPIRATORY_TRACT
  Filled 2023-04-07: qty 3

## 2023-04-07 NOTE — Progress Notes (Signed)
PROGRESS NOTE  [name] ZOX:[id number] DOB: [birth year] DOA: 04/06/2023 PCP: Myrlene Broker, MD   LOS: 1 day   Brief Narrative / Interim history: 77 year old female with history of neck cancer status post laryngectomy now with chronic trach, CKD 5, chronic diastolic CHF, COPD, history of CVA, prior DVT, hypothyroidism, PAF, CAD who comes into the hospital with shortness of breath and generalized weakness.  Subjective / 24h Interval events: Feels extremely weak this morning.  She has also been complaining of shortness of breath.  Daughter is at bedside and assists with interpreting  Assesement and Plan: Principal Problem:   CHF exacerbation Active Problems:   Hypertension   COPD (chronic obstructive pulmonary disease)   Chronic pain syndrome   CKD (chronic kidney disease) stage 5, GFR less than 15 ml/min   PAD (peripheral artery disease)   Hypothyroidism   History of head and neck cancer-s/p laryngectomy-with tracheostomy   PAF (paroxysmal atrial fibrillation)-CHADS2 Vas score of 5   History of DVT (deep vein thrombosis)   Anemia in CKD (chronic kidney disease)   Alaryngeal voice   History of CVA (cerebrovascular accident)   Chronic diastolic (congestive) heart failure   Principal problem Acute on chronic diastolic CHF -chest x-ray on admission showed a degree of pulmonary edema with cardiomegaly.  Received Lasix yesterday, she is still feeling weak and short of breath but suspect anemia also plays a role.  Repeat Lasix today  Active problems Anemia of chronic kidney disease -hemoglobin overall trending down, normal obvious bleeding.  She is complaining of worsening back pain, obtain a CT scan to rule out retroperitoneal bleed -Transfuse 1 unit of packed red blood cells today  History of neck cancer, laryngectomy -appears at baseline  CKD 5 -baseline creatinine in the 4 range, as high as 4.7 last November.  Currently at baseline  PAD -continue  cilostazol  Essential hypertension -continue home regimen as below  GAD, MDD -continue home lorazepam, mirtazapine, duloxetine  Hypothyroidism -continue home Synthroid  Scheduled Meds:  apixaban  2.5 mg Oral BID   aspirin EC  81 mg Oral Daily   carvedilol  25 mg Oral BID WC   cilostazol  100 mg Oral BID   [START ON 04/11/2023] cloNIDine  0.1 mg Transdermal Weekly   DULoxetine  30 mg Oral Daily   hydrALAZINE  100 mg Oral Q8H   And   hydrALAZINE  25 mg Oral Q8H   isosorbide mononitrate  60 mg Oral Daily   levothyroxine  50 mcg Oral QAC breakfast   minoxidil  2.5 mg Oral BID   mirtazapine  15 mg Oral QHS   pantoprazole  40 mg Oral Daily   rosuvastatin  20 mg Oral Daily   sodium bicarbonate  650 mg Oral TID   sodium chloride flush  3 mL Intravenous Q12H   Continuous Infusions:  sodium chloride 250 mL (04/07/23 0956)   PRN Meds:.sodium chloride, ipratropium-albuterol, labetalol, LORazepam, ondansetron (ZOFRAN) IV, sodium chloride flush, sodium chloride HYPERTONIC  Current Outpatient Medications  Medication Instructions   acetaminophen-codeine (TYLENOL #3) 300-30 MG tablet 1 tablet, Oral, Every 6 hours PRN   aspirin EC 81 mg, Oral, Daily, Swallow whole.   Camphor-Menthol-Methyl Sal (SALONPAS EX) 1 patch, Apply externally, As needed   carvedilol (COREG) 25 MG tablet TAKE 1 TABLET (25 MG TOTAL) BY MOUTH TWICE A DAY WITH MEALS   cilostazol (PLETAL) 100 MG tablet TAKE 1 TABLET BY MOUTH TWICE A DAY   cloNIDine (CATAPRES - DOSED IN MG/24 HR)  0.1 mg, Transdermal, Weekly   cyclobenzaprine (FLEXERIL) 5 MG tablet TAKE 0.5 TABLETS (2.5 MG TOTAL) BY MOUTH AT BEDTIME AS NEEDED FOR MUSCLE SPASMS.   diphenoxylate-atropine (LOMOTIL) 2.5-0.025 MG tablet 1 tablet, Oral, Daily PRN   DULoxetine (CYMBALTA) 20 MG capsule Oral, Daily   DULoxetine (CYMBALTA) 30 mg, Oral, Daily   Eliquis 2.5 mg, Oral, 2 times daily   furosemide (LASIX) 80 mg, Oral, 2 times daily, For increase edema   guaiFENesin  (MUCINEX) 1,200 mg, Oral, 2 times daily   hydrALAZINE (APRESOLINE) 100 MG tablet Take 1 pill 3 times a day (total dose 125 mg with 25 mg tablet)   hydrALAZINE (APRESOLINE) 25 MG tablet Give 1 pill TID (1 of 100 mg and 1 of 25 mg tablets 3 times a day for total dose 125 mg TID) Hold if SBP less than 120   ipratropium-albuterol (DUONEB) 0.5-2.5 (3) MG/3ML SOLN 3 mLs, Nebulization, 2 times daily   isosorbide mononitrate (IMDUR) 60 mg, Oral, Daily   levothyroxine (SYNTHROID) 50 mcg, Oral, Daily before breakfast, Additional refill from pcp   LORazepam (ATIVAN) 1 mg, Oral, 2 times daily PRN   minoxidil (LONITEN) 2.5 mg, Oral, 2 times daily   mirtazapine (REMERON) 15 MG tablet TAKE 1 TABLET BY MOUTH EVERYDAY AT BEDTIME   nitroGLYCERIN (NITROSTAT) 0.4 mg, Sublingual, Every 5 min x3 PRN   omeprazole (PRILOSEC) 40 mg, Oral, Daily   ondansetron (ZOFRAN) 4 MG tablet TAKE 1 TABLET BY MOUTH EVERY 8 HOURS AS NEEDED FOR NAUSEA AND VOMITING   ondansetron (ZOFRAN) 40 MG/20ML SOLN injection Inject 2 mL intramuscularly every 6 hours as needed for nausea or vomiting   PE-diphenhydrAMINE-DM-GG-APAP (DELSYM DAY NIGHT PO) 20 mLs, Oral, 2 times daily PRN   rosuvastatin (CRESTOR) 20 mg, Oral, Daily   sodium bicarbonate 650 mg, Oral, 3 times daily   SYRINGE-NEEDLE, DISP, 3 ML (B-D INTEGRA SYRINGE) 23G X 1" 3 ML MISC Use with zofran injections    Diet Orders (From admission, onward)     Start     Ordered   04/06/23 1228  Diet renal with fluid restriction Fluid restriction: 1800 mL Fluid; Room service appropriate? Yes; Fluid consistency: Thin  Diet effective now       Question Answer Comment  Fluid restriction: 1800 mL Fluid   Room service appropriate? Yes   Fluid consistency: Thin      04/06/23 1227            DVT prophylaxis: apixaban (ELIQUIS) tablet 2.5 mg Start: 04/06/23 2200 apixaban (ELIQUIS) tablet 2.5 mg   Lab Results  Component Value Date   PLT 189 04/07/2023      Code Status: DNR  Family  Communication: daughter at bedside   Status is: Inpatient  Remains inpatient appropriate because: severity of illness  Level of care: Med-Surg  Consultants:  none  Objective: Vitals:   04/07/23 0935 04/07/23 0943 04/07/23 0958 04/07/23 1131  BP:  (!) 186/66 (!) 181/64   Pulse: 88 93 82 83  Resp:    19  Temp: 99.4 F (37.4 C) 99.7 F (37.6 C) 98.7 F (37.1 C)   TempSrc: Oral Oral Oral   SpO2: 96% 97%  94%  Weight:      Height:        Intake/Output Summary (Last 24 hours) at 04/07/2023 1150 Last data filed at 04/07/2023 1000 Gross per 24 hour  Intake --  Output 600 ml  Net -600 ml   Wt Readings from Last 3 Encounters:  04/06/23  51.7 kg  04/06/23 51.7 kg  03/12/23 51.7 kg    Examination:  Constitutional: NAD Eyes: no scleral icterus ENMT: Mucous membranes are moist.  Neck: normal, supple Respiratory: clear to auscultation bilaterally, no wheezing, no crackles. Normal respiratory effort. No accessory muscle use.  Cardiovascular: Regular rate and rhythm, no murmurs / rubs / gallops. No LE edema.  Abdomen: non distended, no tenderness. Bowel sounds positive.  Musculoskeletal: no clubbing / cyanosis.   Data Reviewed: I have independently reviewed following labs and imaging studies   CBC Recent Labs  Lab 04/06/23 0946 04/07/23 0421  WBC 7.0 7.5  HGB 6.7* 6.0*  HCT 21.5* 19.3*  PLT 187 189  MCV 88.8 88.9  MCH 27.7 27.6  MCHC 31.2 31.1  RDW 15.5 15.5  LYMPHSABS 0.8  --   MONOABS 0.5  --   EOSABS 0.1  --   BASOSABS 0.0  --     Recent Labs  Lab 04/06/23 0946 04/06/23 1203 04/07/23 0421  NA 140  --  141  K 4.5  --  3.8  CL 104  --  104  CO2 23  --  22  GLUCOSE 122*  --  111*  BUN 83*  --  81*  CREATININE 4.60*  --  4.68*  CALCIUM 8.6*  --  8.8*  AST 18  --   --   ALT 12  --   --   ALKPHOS 69  --   --   BILITOT 0.5  --   --   ALBUMIN 3.4*  --   --   LATICACIDVEN 0.9 0.6  --   BNP 850.0*  --   --      ------------------------------------------------------------------------------------------------------------------ No results for input(s): "CHOL", "HDL", "LDLCALC", "TRIG", "CHOLHDL", "LDLDIRECT" in the last 72 hours.  Lab Results  Component Value Date   HGBA1C 5.5 08/07/2022   ------------------------------------------------------------------------------------------------------------------ No results for input(s): "TSH", "T4TOTAL", "T3FREE", "THYROIDAB" in the last 72 hours.  Invalid input(s): "FREET3"  Cardiac Enzymes No results for input(s): "CKMB", "TROPONINI", "MYOGLOBIN" in the last 168 hours.  Invalid input(s): "CK" ------------------------------------------------------------------------------------------------------------------    Component Value Date/Time   BNP 850.0 (H) 04/06/2023 0946    CBG: No results for input(s): "GLUCAP" in the last 168 hours.  Recent Results (from the past 240 hour(s))  SARS Coronavirus 2 by RT PCR (hospital order, performed in [facility] hospital lab) *cepheid single result test* Anterior Nasal Swab     Status: None   Collection Time: 04/06/23  9:46 AM   Specimen: Anterior Nasal Swab  Result Value Ref Range Status   SARS Coronavirus 2 by RT PCR NEGATIVE NEGATIVE Final    Comment: (NOTE) SARS-CoV-2 target nucleic acids are NOT DETECTED.  The SARS-CoV-2 RNA is generally detectable in upper and lower respiratory specimens during the acute phase of infection. The lowest concentration of SARS-CoV-2 viral copies this assay can detect is 250 copies / mL. A negative result does not preclude SARS-CoV-2 infection and should not be used as the sole basis for treatment or other patient management decisions.  A negative result may occur with improper specimen collection / handling, submission of specimen other than nasopharyngeal swab, presence of viral mutation(s) within the areas targeted by this assay, and inadequate number of viral  copies (<250 copies / mL). A negative result must be combined with clinical observations, patient history, and epidemiological information.  Fact Sheet for Patients:   RoadLapTop.co.za  Fact Sheet for Healthcare Providers: http://kim-miller.com/  This test is not yet approved  or  cleared by the [place] and has been authorized for detection and/or diagnosis of SARS-CoV-2 by FDA under an Emergency Use Authorization (EUA).  This EUA will remain in effect (meaning this test can be used) for the duration of the COVID-19 declaration under Section 564(b)(1) of the Act, 21 U.S.C. section 360bbb-3(b)(1), unless the authorization is terminated or revoked sooner.  Performed at [facility], [address] [place], [place] [address]   Culture, blood (routine x 2)     Status: None (Preliminary result)   Collection Time: 04/06/23  9:46 AM   Specimen: BLOOD  Result Value Ref Range Status   Specimen Description   Final    BLOOD BLOOD RIGHT ARM Performed at [facility], [address], [place], [place] [address]    Special Requests   Final    BOTTLES DRAWN AEROBIC AND ANAEROBIC Blood Culture adequate volume Performed at [facility], [address] [place], [place] [address]    Culture   Final    NO GROWTH < 24 HOURS Performed at [facility], [address], [place], [place] [address]    Report Status PENDING  Incomplete  Culture, blood (routine x 2)     Status: None (Preliminary result)   Collection Time: 04/06/23 10:06 AM   Specimen: BLOOD  Result Value Ref Range Status   Specimen Description   Final    BLOOD BLOOD RIGHT FOREARM Performed at [facility], [address] [place], [place] [address]    Special Requests   Final    BOTTLES DRAWN AEROBIC AND ANAEROBIC Blood Culture adequate volume Performed at [facility], [address], [place], [place] [address]    Culture   Final    NO GROWTH < 24 HOURS Performed at [facility], [address], [place], [place] [address]    Report Status PENDING  Incomplete     Radiology Studies: No results found.   Pamella Pert, MD, PhD [facility]  Between 7 am - 7 pm I am available, please contact me via Amion (for emergencies) or Securechat (non urgent messages)  Between 7 pm - 7 am I am not available, please contact night coverage MD/APP via Amion

## 2023-04-07 NOTE — TOC Initial Note (Signed)
Transition of Care [facility]) - Initial/Assessment Note    Patient Details  Name: [name] MRN: [id number] Date of Birth: [birth year]  Transition of Care [facility]) CM/SW Contact:    Howell Rucks, RN Phone Number: 04/07/2023, 12:42 PM  Clinical Narrative:   NCM call to pt's son Simonne Come) to introduce TOC/NCM role and review for possible dc needs. Per Simonne Come, pt lives with family who support and care for her, provide transportation, pt has PCP in place, pt's family provides tracheostomy care, has no DME or HH care at home. Family to provide transportation home. Pt with CHF primary dx-consult sent to Heart Failure Navigation Team.              Expected Discharge Plan: Home/Self Care Barriers to Discharge: Continued Medical Work up   Patient Goals and CMS Choice Patient states their goals for this hospitalization and ongoing recovery are:: Return home with family per Shari Heritage Simonne Come)          Expected Discharge Plan and Services       Living arrangements for the past 2 months: Single Family Home                                      Prior Living Arrangements/Services Living arrangements for the past 2 months: Single Family Home Lives with:: Adult Children Patient language and need for interpreter reviewed:: Yes Do you feel safe going back to the place where you live?: Yes      Need for Family Participation in Patient Care: Yes (Comment) Care giver support system in place?: Yes (comment)   Criminal Activity/Legal Involvement Pertinent to Current Situation/Hospitalization: No - Comment as needed  Activities of Daily Living Home Assistive Devices/Equipment: Vent/Trach supplies ADL Screening (condition at time of admission) Patient's cognitive ability adequate to safely complete daily activities?: Yes Is the patient deaf or have difficulty hearing?: No Does the patient have difficulty seeing, even when wearing glasses/contacts?: Yes Does the patient have difficulty concentrating,  remembering, or making decisions?: No Patient able to express need for assistance with ADLs?: Yes Does the patient have difficulty dressing or bathing?: No Independently performs ADLs?: Yes (appropriate for developmental age) Does the patient have difficulty walking or climbing stairs?: Yes Weakness of Legs: Both Weakness of Arms/Hands: Both  Permission Sought/Granted Permission sought to share information with : Case Manager Permission granted to share information with : Yes, Verbal Permission Granted  Share Information with NAME: Fannie Knee, RN           Emotional Assessment       Orientation: : Oriented to Self, Oriented to Place, Oriented to  Time Alcohol / Substance Use: Not Applicable Psych Involvement: No (comment)  Admission diagnosis:  CHF exacerbation [I50.9] Anemia, unspecified type [D64.9] Dyspnea, unspecified type [R06.00] Chronic renal impairment, unspecified CKD stage [N18.9] Patient Active Problem List   Diagnosis Date Noted   Spondylosis of thoracolumbar region without myelopathy or radiculopathy 04/06/2023   Acute respiratory distress 04/06/2023   CHF exacerbation 04/06/2023   Vision loss 11/17/2022   Pain, eye, left 11/17/2022   Chronic diastolic (congestive) heart failure 09/02/2022   Alaryngeal voice 08/13/2022   History of CVA (cerebrovascular accident) 08/13/2022   Facial swelling 01/30/2022   History of DVT (deep vein thrombosis) 01/05/2022   Anemia in CKD (chronic kidney disease) 01/05/2022   Demand ischemia    History of head and neck cancer-s/p laryngectomy-with tracheostomy  01/02/2022   PAF (paroxysmal atrial fibrillation)-CHADS2 Vas score of 5 01/02/2022   Neck pain 07/11/2021   Aortic atherosclerosis 04/09/2021   Moderate protein-calorie malnutrition 04/09/2021   Low back pain 01/23/2021   HLD (hyperlipidemia) 01/13/2021   Hypothyroidism 02/25/2020   Senile osteoporosis 02/15/2020   PAD (peripheral artery disease) 02/15/2020   Tinea  corporis 02/15/2020   Gastroesophageal reflux disease without esophagitis 09/09/2019   Osteopenia 01/19/2019   CKD (chronic kidney disease) stage 5, GFR less than 15 ml/min 01/19/2019   Diarrhea 11/04/2018   Thiamine deficiency 01/02/2018   Prediabetes 12/28/2017   Dietary folate deficiency anemia 12/28/2017   Moderate episode of recurrent major depressive disorder 10/22/2017   S/P laryngectomy 11/03/2016   Stenosis of tracheal stoma 06/10/2016   Diverticulum of pharynx 08/21/2015   Esophageal obstruction 07/17/2015   Esophageal stricture 07/17/2015   Major depression, chronic 02/28/2015   Osteoporosis 02/01/2015   Dysphagia, pharyngoesophageal phase 05/22/2014   History of radiation therapy    Insomnia 02/07/2013   History of uterine cancer 11/24/2012   Chronic pain syndrome 09/03/2012   COPD (chronic obstructive pulmonary disease) 08/10/2012   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 08/08/2012    Class: Chronic   Hypertension    GAD (generalized anxiety disorder)    PCP:  Myrlene Broker, MD Pharmacy:   [facility] [phone] [place], [place] - [address] [place] [address] Phone: [phone] Fax: [phone]     Social Determinants of Health (SDOH) Social History: SDOH Screenings   Food Insecurity: No Food Insecurity (04/06/2023)  Housing: Low Risk  (04/06/2023)  Transportation Needs: No Transportation Needs (04/06/2023)  Utilities: Not At Risk (04/06/2023)  Alcohol Screen: Low Risk  (10/23/2022)  Depression (PHQ2-9): Low Risk  (04/06/2023)  Financial Resource Strain: Low Risk  (10/23/2022)  Physical Activity: Inactive (10/23/2022)  Social Connections: Socially Isolated (10/23/2022)  Stress: No Stress Concern Present (10/23/2022)  Tobacco Use: Medium Risk (04/06/2023)   SDOH Interventions:     Readmission Risk Interventions    04/07/2023   12:23 PM 10/30/2022    4:48 PM 09/04/2022    4:32 PM  Readmission Risk  Prevention Plan  Transportation Screening Complete Complete Complete  PCP or Specialist Appt within 3-5 Days   Complete  HRI or Home Care Consult   Complete  Social Work Consult for Recovery Care Planning/Counseling   Complete  Palliative Care Screening   Not Applicable  Medication Review Oceanographer) Complete Complete Complete  HRI or Home Care Consult Complete Complete   SW Recovery Care/Counseling Consult Complete Complete   Palliative Care Screening Not Applicable Complete   Skilled Nursing Facility Not Applicable Not Applicable

## 2023-04-07 NOTE — Progress Notes (Signed)
Patient is alert, communicates with gestures, virtual interpreter utilized as well. Needs are anticipated and met. Patient denies pain and shows no sign of distress, she is resting in bed at time of entry. Call bell is within patients reach.

## 2023-04-07 NOTE — Plan of Care (Signed)
  Problem: Education: Goal: Knowledge of General Education information will improve Description: Including pain rating scale, medication(s)/side effects and non-pharmacologic comfort measures Outcome: Progressing   Problem: Clinical Measurements: Goal: Ability to maintain clinical measurements within normal limits will improve Outcome: Progressing   Problem: Nutrition: Goal: Adequate nutrition will be maintained Outcome: Progressing   Problem: Coping: Goal: Level of anxiety will decrease Outcome: Progressing   Problem: Elimination: Goal: Will not experience complications related to bowel motility Outcome: Progressing Goal: Will not experience complications related to urinary retention Outcome: Progressing   Problem: Pain Managment: Goal: General experience of comfort will improve Outcome: Progressing   Problem: Safety: Goal: Ability to remain free from injury will improve Outcome: Progressing   Problem: Skin Integrity: Goal: Risk for impaired skin integrity will decrease Outcome: Progressing   

## 2023-04-08 ENCOUNTER — Inpatient Hospital Stay (HOSPITAL_COMMUNITY): Payer: Medicare Other

## 2023-04-08 ENCOUNTER — Encounter (HOSPITAL_COMMUNITY): Payer: Self-pay | Admitting: Obstetrics and Gynecology

## 2023-04-08 DIAGNOSIS — I3139 Other pericardial effusion (noninflammatory): Secondary | ICD-10-CM

## 2023-04-08 DIAGNOSIS — I5033 Acute on chronic diastolic (congestive) heart failure: Secondary | ICD-10-CM | POA: Diagnosis not present

## 2023-04-08 LAB — CBC
HCT: 23.6 % — ABNORMAL LOW (ref 36.0–46.0)
Hemoglobin: 7.6 g/dL — ABNORMAL LOW (ref 12.0–15.0)
MCH: 27.3 pg (ref 26.0–34.0)
MCHC: 32.2 g/dL (ref 30.0–36.0)
MCV: 84.9 fL (ref 80.0–100.0)
Platelets: 197 10*3/uL (ref 150–400)
RBC: 2.78 MIL/uL — ABNORMAL LOW (ref 3.87–5.11)
RDW: 15.9 % — ABNORMAL HIGH (ref 11.5–15.5)
WBC: 8 10*3/uL (ref 4.0–10.5)
nRBC: 0.2 % (ref 0.0–0.2)

## 2023-04-08 LAB — BPAM RBC: ISSUE DATE / TIME: 202404240933

## 2023-04-08 LAB — ECHOCARDIOGRAM COMPLETE
AR max vel: 1.27 cm2
AV Area VTI: 1.36 cm2
AV Area mean vel: 1.35 cm2
AV Mean grad: 6.5 mmHg
AV Peak grad: 13.2 mmHg
Ao pk vel: 1.82 m/s
Area-P 1/2: 3.91 cm2
Calc EF: 63.4 %
Height: 62 in
MV VTI: 1.5 cm2
S' Lateral: 3.1 cm
Single Plane A2C EF: 64.9 %
Single Plane A4C EF: 63.4 %
Weight: 1823.65 oz

## 2023-04-08 LAB — COMPREHENSIVE METABOLIC PANEL
ALT: 10 U/L (ref 0–44)
AST: 14 U/L — ABNORMAL LOW (ref 15–41)
Albumin: 3 g/dL — ABNORMAL LOW (ref 3.5–5.0)
Alkaline Phosphatase: 65 U/L (ref 38–126)
Anion gap: 13 (ref 5–15)
BUN: 79 mg/dL — ABNORMAL HIGH (ref 8–23)
CO2: 25 mmol/L (ref 22–32)
Calcium: 8.4 mg/dL — ABNORMAL LOW (ref 8.9–10.3)
Chloride: 100 mmol/L (ref 98–111)
Creatinine, Ser: 4.69 mg/dL — ABNORMAL HIGH (ref 0.44–1.00)
GFR, Estimated: 9 mL/min — ABNORMAL LOW (ref >60)
Glucose, Bld: 108 mg/dL — ABNORMAL HIGH (ref 70–99)
Potassium: 3.8 mmol/L (ref 3.5–5.1)
Sodium: 138 mmol/L (ref 135–145)
Total Bilirubin: 0.9 mg/dL (ref 0.3–1.2)
Total Protein: 6.3 g/dL — ABNORMAL LOW (ref 6.5–8.1)

## 2023-04-08 LAB — TYPE AND SCREEN
ABO/RH(D): O POS
Antibody Screen: NEGATIVE
Unit division: 0

## 2023-04-08 MED ORDER — ALUM & MAG HYDROXIDE-SIMETH 200-200-20 MG/5ML PO SUSP
30.0000 mL | Freq: Four times a day (QID) | ORAL | Status: DC | PRN
  Administered 2023-04-08: 30 mL via ORAL
  Filled 2023-04-08: qty 30

## 2023-04-08 MED ORDER — ACETAMINOPHEN 325 MG PO TABS
650.0000 mg | ORAL_TABLET | Freq: Four times a day (QID) | ORAL | Status: AC | PRN
  Administered 2023-04-08 – 2023-04-10 (×2): 650 mg via ORAL
  Filled 2023-04-08 (×2): qty 2

## 2023-04-08 MED ORDER — FUROSEMIDE 10 MG/ML IJ SOLN
40.0000 mg | Freq: Once | INTRAMUSCULAR | Status: AC
  Administered 2023-04-08: 40 mg via INTRAVENOUS
  Filled 2023-04-08: qty 4

## 2023-04-08 NOTE — Progress Notes (Signed)
PROGRESS NOTE  [name]:[id number] DOB: [birth year] DOA: 04/06/2023 PCP: Myrlene Broker, MD   LOS: 2 days   Brief Narrative / Interim history: 77 year old female with history of neck cancer status post laryngectomy now with chronic trach, CKD 5, chronic diastolic CHF, COPD, history of CVA, prior DVT, hypothyroidism, PAF, CAD who comes into the hospital with shortness of breath and generalized weakness.  Subjective / 24h Interval events: Feels better, smiling, daughter is at bedside  Assesement and Plan: Principal Problem:   CHF exacerbation Active Problems:   Hypertension   COPD (chronic obstructive pulmonary disease)   Chronic pain syndrome   CKD (chronic kidney disease) stage 5, GFR less than 15 ml/min   PAD (peripheral artery disease)   Hypothyroidism   History of head and neck cancer-s/p laryngectomy-with tracheostomy   PAF (paroxysmal atrial fibrillation)-CHADS2 Vas score of 5   History of DVT (deep vein thrombosis)   Anemia in CKD (chronic kidney disease)   Alaryngeal voice   History of CVA (cerebrovascular accident)   Chronic diastolic (congestive) heart failure   Principal problem Acute hypoxic respiratory failure due to acute on chronic diastolic CHF -chest x-ray on admission showed a degree of pulmonary edema with cardiomegaly.  Continue Lasix, evaluate on a daily basis -Normally she is on room air, currently on 8 L this morning.  Wean off as tolerated -CT scan raised concern about the pericardial effusion, obtain a 2D echo today  Active problems Anemia of chronic kidney disease -hemoglobin overall trending down, no obvious bleeding.  She is complaining of worsening back pain, CT scan of the abdomen ruled out retroperitoneal bleed.  Received antiparallel blood cells with improvement in her hemoglobin  History of neck cancer, laryngectomy -appears at baseline, however she is requiring oxygen here  CKD 5 -baseline creatinine in the 4 range, as high  as 4.7 last November.  Currently at baseline  PAD -continue cilostazol  Essential hypertension -continue home regimen as below  GAD, MDD -continue home lorazepam, mirtazapine, duloxetine  Hypothyroidism -continue home Synthroid  Renal lesions -neoplasm could not be excluded, may need to have additional workup as an outpatient  Scheduled Meds:  apixaban  2.5 mg Oral BID   aspirin EC  81 mg Oral Daily   carvedilol  25 mg Oral BID WC   cilostazol  100 mg Oral BID   [START ON 04/11/2023] cloNIDine  0.1 mg Transdermal Weekly   DULoxetine  30 mg Oral Daily   hydrALAZINE  100 mg Oral Q8H   And   hydrALAZINE  25 mg Oral Q8H   isosorbide mononitrate  60 mg Oral Daily   levothyroxine  50 mcg Oral QAC breakfast   minoxidil  2.5 mg Oral BID   mirtazapine  15 mg Oral QHS   pantoprazole  40 mg Oral Daily   rosuvastatin  20 mg Oral Daily   sodium bicarbonate  650 mg Oral TID   sodium chloride flush  3 mL Intravenous Q12H   Continuous Infusions:  sodium chloride 250 mL (04/07/23 0956)   PRN Meds:.sodium chloride, ipratropium-albuterol, labetalol, LORazepam, ondansetron (ZOFRAN) IV, sodium chloride flush, sodium chloride HYPERTONIC  Current Outpatient Medications  Medication Instructions   acetaminophen-codeine (TYLENOL #3) 300-30 MG tablet 1 tablet, Oral, Every 6 hours PRN   aspirin EC 81 mg, Oral, Daily, Swallow whole.   Camphor-Menthol-Methyl Sal (SALONPAS EX) 1 patch, Apply externally, As needed   carvedilol (COREG) 25 MG tablet TAKE 1 TABLET (25 MG TOTAL) BY MOUTH TWICE A  DAY WITH MEALS   cilostazol (PLETAL) 100 MG tablet TAKE 1 TABLET BY MOUTH TWICE A DAY   cloNIDine (CATAPRES - DOSED IN MG/24 HR) 0.1 mg, Transdermal, Weekly   cyclobenzaprine (FLEXERIL) 5 MG tablet TAKE 0.5 TABLETS (2.5 MG TOTAL) BY MOUTH AT BEDTIME AS NEEDED FOR MUSCLE SPASMS.   diphenoxylate-atropine (LOMOTIL) 2.5-0.025 MG tablet 1 tablet, Oral, Daily PRN   DULoxetine (CYMBALTA) 20 MG capsule Oral, Daily    DULoxetine (CYMBALTA) 30 mg, Oral, Daily   Eliquis 2.5 mg, Oral, 2 times daily   furosemide (LASIX) 80 mg, Oral, 2 times daily, For increase edema   guaiFENesin (MUCINEX) 1,200 mg, Oral, 2 times daily   hydrALAZINE (APRESOLINE) 100 MG tablet Take 1 pill 3 times a day (total dose 125 mg with 25 mg tablet)   hydrALAZINE (APRESOLINE) 25 MG tablet Give 1 pill TID (1 of 100 mg and 1 of 25 mg tablets 3 times a day for total dose 125 mg TID) Hold if SBP less than 120   ipratropium-albuterol (DUONEB) 0.5-2.5 (3) MG/3ML SOLN 3 mLs, Nebulization, 2 times daily   isosorbide mononitrate (IMDUR) 60 mg, Oral, Daily   levothyroxine (SYNTHROID) 50 mcg, Oral, Daily before breakfast, Additional refill from pcp   LORazepam (ATIVAN) 1 mg, Oral, 2 times daily PRN   minoxidil (LONITEN) 2.5 mg, Oral, 2 times daily   mirtazapine (REMERON) 15 MG tablet TAKE 1 TABLET BY MOUTH EVERYDAY AT BEDTIME   nitroGLYCERIN (NITROSTAT) 0.4 mg, Sublingual, Every 5 min x3 PRN   omeprazole (PRILOSEC) 40 mg, Oral, Daily   ondansetron (ZOFRAN) 4 MG tablet TAKE 1 TABLET BY MOUTH EVERY 8 HOURS AS NEEDED FOR NAUSEA AND VOMITING   ondansetron (ZOFRAN) 40 MG/20ML SOLN injection Inject 2 mL intramuscularly every 6 hours as needed for nausea or vomiting   PE-diphenhydrAMINE-DM-GG-APAP (DELSYM DAY NIGHT PO) 20 mLs, Oral, 2 times daily PRN   rosuvastatin (CRESTOR) 20 mg, Oral, Daily   sodium bicarbonate 650 mg, Oral, 3 times daily   SYRINGE-NEEDLE, DISP, 3 ML (B-D INTEGRA SYRINGE) 23G X 1" 3 ML MISC Use with zofran injections    Diet Orders (From admission, onward)     Start     Ordered   04/06/23 1228  Diet renal with fluid restriction Fluid restriction: 1800 mL Fluid; Room service appropriate? Yes; Fluid consistency: Thin  Diet effective now       Question Answer Comment  Fluid restriction: 1800 mL Fluid   Room service appropriate? Yes   Fluid consistency: Thin      04/06/23 1227            DVT prophylaxis: apixaban (ELIQUIS)  tablet 2.5 mg Start: 04/06/23 2200 apixaban (ELIQUIS) tablet 2.5 mg   Lab Results  Component Value Date   PLT 197 04/08/2023      Code Status: DNR  Family Communication: daughter at bedside   Status is: Inpatient  Remains inpatient appropriate because: severity of illness  Level of care: Med-Surg  Consultants:  none  Objective: Vitals:   04/07/23 2053 04/07/23 2355 04/08/23 0406 04/08/23 0537  BP: (!) 170/71   (!) 177/74  Pulse: 80 93 83 89  Resp: 20 (!) Temp: 98.6 F (37 C)   98.7 F (37.1 C)  TempSrc: Oral     SpO2: 98% 92% 94% 92%  Weight:      Height:        Intake/Output Summary (Last 24 hours) at 04/08/2023 1054 Last data filed at 04/08/2023 [id number]  Gross per 24 hour  Intake 902.42 ml  Output 700 ml  Net 202.42 ml    Wt Readings from Last 3 Encounters:  04/06/23 51.7 kg  04/06/23 51.7 kg  03/12/23 51.7 kg    Examination:  Constitutional: NAD Eyes: lids and conjunctivae normal, no scleral icterus ENMT: mmm Neck: normal, supple Respiratory: clear to auscultation bilaterally, no wheezing, no crackles. Normal respiratory effort.  Cardiovascular: Regular rate and rhythm, no murmurs / rubs / gallops. No LE edema. Abdomen: soft, no distention, no tenderness. Bowel sounds positive.   Data Reviewed: I have independently reviewed following labs and imaging studies   CBC Recent Labs  Lab 04/06/23 0946 04/07/23 0421 04/08/23 0635  WBC 7.0 7.5 8.0  HGB 6.7* 6.0* 7.6*  HCT 21.5* 19.3* 23.6*  PLT 187 189 197  MCV 88.8 88.9 84.9  MCH 27.7 27.6 27.3  MCHC 31.2 31.1 32.2  RDW 15.5 15.5 15.9*  LYMPHSABS 0.8  --   --   MONOABS 0.5  --   --   EOSABS 0.1  --   --   BASOSABS 0.0  --   --      Recent Labs  Lab 04/06/23 0946 04/06/23 1203 04/07/23 0421 04/08/23 0635  NA 140  --  141 138  K 4.5  --  3.8 3.8  CL 104  --  104 100  CO2 23  --  22 25  GLUCOSE 122*  --  111* 108*  BUN 83*  --  81* 79*  CREATININE 4.60*  --  4.68* 4.69*   CALCIUM 8.6*  --  8.8* 8.4*  AST 18  --   --  14*  ALT 12  --   --  10  ALKPHOS 69  --   --  65  BILITOT 0.5  --   --  0.9  ALBUMIN 3.4*  --   --  3.0*  LATICACIDVEN 0.9 0.6  --   --   BNP 850.0*  --   --   --      ------------------------------------------------------------------------------------------------------------------ No results for input(s): "CHOL", "HDL", "LDLCALC", "TRIG", "CHOLHDL", "LDLDIRECT" in the last 72 hours.  Lab Results  Component Value Date   HGBA1C 5.5 08/07/2022   ------------------------------------------------------------------------------------------------------------------ No results for input(s): "TSH", "T4TOTAL", "T3FREE", "THYROIDAB" in the last 72 hours.  Invalid input(s): "FREET3"  Cardiac Enzymes No results for input(s): "CKMB", "TROPONINI", "MYOGLOBIN" in the last 168 hours.  Invalid input(s): "CK" ------------------------------------------------------------------------------------------------------------------    Component Value Date/Time   BNP 850.0 (H) 04/06/2023 0946    CBG: No results for input(s): "GLUCAP" in the last 168 hours.  Recent Results (from the past 240 hour(s))  SARS Coronavirus 2 by RT PCR (hospital order, performed in [facility] hospital lab) *cepheid single result test* Anterior Nasal Swab     Status: None   Collection Time: 04/06/23  9:46 AM   Specimen: Anterior Nasal Swab  Result Value Ref Range Status   SARS Coronavirus 2 by RT PCR NEGATIVE NEGATIVE Final    Comment: (NOTE) SARS-CoV-2 target nucleic acids are NOT DETECTED.  The SARS-CoV-2 RNA is generally detectable in upper and lower respiratory specimens during the acute phase of infection. The lowest concentration of SARS-CoV-2 viral copies this assay can detect is 250 copies / mL. A negative result does not preclude SARS-CoV-2 infection and should not be used as the sole basis for treatment or other patient management decisions.  A negative result may  occur with improper specimen collection / handling, submission of  specimen other than nasopharyngeal swab, presence of viral mutation(s) within the areas targeted by this assay, and inadequate number of viral copies (<250 copies / mL). A negative result must be combined with clinical observations, patient history, and epidemiological information.  Fact Sheet for Patients:   RoadLapTop.co.za  Fact Sheet for Healthcare Providers: http://kim-miller.com/  This test is not yet approved or  cleared by the [place] FDA and has been authorized for detection and/or diagnosis of SARS-CoV-2 by FDA under an Emergency Use Authorization (EUA).  This EUA will remain in effect (meaning this test can be used) for the duration of the COVID-19 declaration under Section 564(b)(1) of the Act, 21 U.S.C. section 360bbb-3(b)(1), unless the authorization is terminated or revoked sooner.  Performed at [facility], [address], [place], [place] [address]   Culture, blood (routine x 2)     Status: None (Preliminary result)   Collection Time: 04/06/23  9:46 AM   Specimen: BLOOD  Result Value Ref Range Status   Specimen Description   Final    BLOOD BLOOD RIGHT ARM Performed at [facility], [address] [place], [place] [address]    Special Requests   Final    BOTTLES DRAWN AEROBIC AND ANAEROBIC Blood Culture adequate volume Performed at [facility], [address], [place], [place] [address]    Culture   Final    NO GROWTH 2 DAYS Performed at [facility], [address], [place], [place] [address]    Report Status PENDING  Incomplete  Culture, blood (routine x 2)     Status: None (Preliminary result)   Collection Time: 04/06/23 10:06 AM   Specimen: BLOOD  Result Value Ref Range Status   Specimen Description   Final    BLOOD BLOOD RIGHT FOREARM Performed at [facility], [address], [place], [place] [address]    Special Requests   Final    BOTTLES DRAWN AEROBIC AND ANAEROBIC Blood Culture adequate volume Performed at [facility], [address], [place], [place] [address]    Culture   Final    NO GROWTH 2 DAYS Performed at [facility], [address], [place], [place] [address]    Report Status PENDING  Incomplete     Radiology Studies: CT ABDOMEN PELVIS WO CONTRAST  Result Date: 04/07/2023 CLINICAL DATA:  Pain.  Retroperitoneal bleed EXAM: CT ABDOMEN AND PELVIS WITHOUT CONTRAST TECHNIQUE: Multidetector CT imaging of the abdomen and pelvis was performed following the standard protocol without IV contrast. RADIATION DOSE REDUCTION: This exam was performed according to the departmental dose-optimization program which includes automated exposure control, adjustment of the mA and/or kV according to patient size and/or use of iterative reconstruction technique. COMPARISON:  Noncontrast CT 07/04/2021 FINDINGS: Lower chest: Moderate right and small left pleural effusion with adjacent lung opacities. Atelectasis versus infiltrate. Recommend follow-up. Breathing motion lung bases. There is a enlarged heart with a moderate pericardial effusion. Coronary artery calcifications are seen. Hepatobiliary: On this non IV contrast exam, the liver is grossly preserved. Liver parenchyma is slightly dense, nonspecific. Please correlate for any history. Gallbladder is nondilated. Pancreas: Small cysts in the midbody of the pancreas measures 7 mm. This has not seen in 2022. Spleen: Normal in size without focal abnormality. Adrenals/Urinary Tract: Right adrenal gland is preserved. There is some thickening of the left adrenal gland but low in density with Hounsfield units of less than 10, adenoma. Both kidneys  are lobular with multiple complex cystic and dense lesions. Many of these were seen previously but these are larger today. A more aggressive  lesion is difficult to exclude amongst the several complex lesions bilaterally. Largest is seen on the right measuring 5.0 x 3.6 cm and left 2.6 by 2.4 cm. Preserved contours of the urinary bladder. Stomach/Bowel: Large bowel has a normal course and caliber. Scattered colonic stool. Stomach and small bowel are also nondilated. Few loops of small bowel are fluid-filled but again nondilated. Vascular/Lymphatic: Extensive vascular calcifications identified diffusely. Potential areas of severe disease particularly along the iliac vessels. Please correlate for areas of stenosis or occlusion. No discrete abnormal lymph node enlargement identified in the abdomen and pelvis. Reproductive: Uterus and bilateral adnexa are unremarkable. Other: Anasarca identified. No free air or free fluid. No retroperitoneal hematoma identified. There is streak artifact from overlapping structures. Musculoskeletal: Curvature and degenerative changes along the spine. IMPRESSION: No evidence of retroperitoneal hematoma. Severe atherosclerotic calcified plaque with areas of likely high-grade stenosis or occlusion of the iliac vessels. Please correlate with clinical history. Several other branch vessels also severe disease. Numerous complex and dense bilateral renal lesions. These very well could be proteinaceous or hemorrhagic but with the liver complexity a solid lesion or neoplasm is not excluded. Recommend additional workup when clinically appropriate and able. Small cystic lesion along the midbody of the pancreas not seen in 2022. Recommend follow-up study such as MRI further characterize when appropriate. Alternatively a follow-up in 6 months could be performed. Bilateral pleural effusions with adjacent lung opacities, right-greater-than-left. Atelectasis versus infiltrate. Recommend follow-up. Enlarged heart.  Pericardial effusion. Electronically Signed   By: Karen Kays M.D.   On: 04/07/2023 16:44     Pamella Pert, MD, PhD [facility]  Between 7 am - 7 pm I am available, please contact me via Amion (for emergencies) or Securechat (non urgent messages)  Between 7 pm - 7 am I am not available, please contact night coverage MD/APP via Amion

## 2023-04-08 NOTE — Evaluation (Signed)
Physical Therapy Evaluation Patient Details Name: [name] MRN: [id number] DOB: 07-09-1946 Today's Date: 04/08/2023  History of Present Illness  77 year old female with history of neck cancer status post laryngectomy now with chronic trach, CKD 5, chronic diastolic CHF, COPD, history of CVA, prior DVT, hypothyroidism, PAF, CAD who comes into the hospital with shortness of breath and generalized weakness. Dx of CHF exacerbation.  Clinical Impression  Pt admitted with above diagnosis. Evaluation limited by episode of incontinence of bowel just before my arrival. Pt able to stand with single UE support for ~2 minutes for assist with cleanup. Pt ambulated 15' in the room with handheld assist, no loss of balance. SpO2 89% on room air at rest, 94% on 4L O2 at rest.  Pt currently with functional limitations due to the deficits listed below (see PT Problem List). Pt will benefit from acute skilled PT to increase their independence and safety with mobility to allow discharge.          Recommendations for follow up therapy are one component of a multi-disciplinary discharge planning process, led by the attending physician.  Recommendations may be updated based on patient status, additional functional criteria and insurance authorization.  Follow Up Recommendations       Assistance Recommended at Discharge Set up Supervision/Assistance  Patient can return home with the following  A little help with walking and/or transfers;A little help with bathing/dressing/bathroom;Assistance with cooking/housework;Assist for transportation    Equipment Recommendations None recommended by PT  Recommendations for Other Services       Functional Status Assessment Patient has had a recent decline in their functional status and demonstrates the ability to make significant improvements in function in a reasonable and predictable amount of time.     Precautions / Restrictions Precautions Precaution Comments:  monitor O2, has a tracheostomy Restrictions Weight Bearing Restrictions: No      Mobility  Bed Mobility               General bed mobility comments: up in bathroom    Transfers Overall transfer level: Needs assistance Equipment used: 1 person hand held assist Transfers: Sit to/from Stand Sit to Stand: Min assist           General transfer comment: min A to steady    Ambulation/Gait Ambulation/Gait assistance: Min guard Gait Distance (Feet): 15 Feet Assistive device: 1 person hand held assist Gait Pattern/deviations: Decreased stride length, Step-through pattern Gait velocity: decr     General Gait Details: steady, no loss of balance, SpO2 89% at rest on room air, 94% on 4L O2 on room air at rest  Stairs            Wheelchair Mobility    Modified Rankin (Stroke Patients Only)       Balance Overall balance assessment: Modified Independent                                           Pertinent Vitals/Pain Pain Assessment Pain Assessment: No/denies pain    Home Living Family/patient expects to be discharged to:: Private residence Living Arrangements: Children Available Help at Discharge: Family;Available PRN/intermittently Type of Home: House Home Access: Level entry       Home Layout: One level Home Equipment: Shower seat Additional Comments: information gleaned from prior PT eval Sept 2023    Prior Function Prior Level of Function : Independent/Modified Independent  Mobility Comments: independent with walking, works at [employer], not on home O2 ADLs Comments: independent bathing and dressing; does not drive     Hand Dominance        Extremity/Trunk Assessment   Upper Extremity Assessment Upper Extremity Assessment: Overall WFL for tasks assessed    Lower Extremity Assessment Lower Extremity Assessment: Overall WFL for tasks assessed       Communication   Communication: Prefers  language other than English (spanish)  Cognition Arousal/Alertness: Awake/alert Behavior During Therapy: WFL for tasks assessed/performed Overall Cognitive Status: Within Functional Limits for tasks assessed                                          General Comments      Exercises     Assessment/Plan    PT Assessment Patient needs continued PT services  PT Problem List Cardiopulmonary status limiting activity;Decreased mobility;Decreased activity tolerance       PT Treatment Interventions DME instruction;Gait training;Therapeutic exercise;Patient/family education    PT Goals (Current goals can be found in the Care Plan section)  Acute Rehab PT Goals Patient Stated Goal: return to working in [employer] shop PT Goal Formulation: With patient Time For Goal Achievement: 04/22/23 Potential to Achieve Goals: Good    Frequency Min 1X/week     Co-evaluation               AM-PAC PT "6 Clicks" Mobility  Outcome Measure Help needed turning from your back to your side while in a flat bed without using bedrails?: None Help needed moving from lying on your back to sitting on the side of a flat bed without using bedrails?: A Little Help needed moving to and from a bed to a chair (including a wheelchair)?: A Little Help needed standing up from a chair using your arms (e.g., wheelchair or bedside chair)?: A Little Help needed to walk in hospital room?: A Little Help needed climbing 3-5 steps with a railing? : A Little 6 Click Score: 19    End of Session Equipment Utilized During Treatment: Oxygen Activity Tolerance: Patient tolerated treatment well Patient left: in chair;with chair alarm set;with call bell/phone within reach Nurse Communication: Mobility status PT Visit Diagnosis: Difficulty in walking, not elsewhere classified (R26.2)    Time: 1610-9604 PT Time Calculation (min) (ACUTE ONLY): 20 min   Charges:   PT Evaluation $PT Eval Moderate  Complexity: 1 Mod          Tamala Ser PT 04/08/2023  Acute Rehabilitation Services  Office [phone]

## 2023-04-09 ENCOUNTER — Inpatient Hospital Stay (HOSPITAL_COMMUNITY): Payer: Medicare Other

## 2023-04-09 DIAGNOSIS — I5033 Acute on chronic diastolic (congestive) heart failure: Secondary | ICD-10-CM | POA: Diagnosis not present

## 2023-04-09 LAB — CBC
HCT: 23 % — ABNORMAL LOW (ref 36.0–46.0)
Hemoglobin: 7.2 g/dL — ABNORMAL LOW (ref 12.0–15.0)
MCH: 26.8 pg (ref 26.0–34.0)
MCHC: 31.3 g/dL (ref 30.0–36.0)
MCV: 85.5 fL (ref 80.0–100.0)
Platelets: 186 10*3/uL (ref 150–400)
RBC: 2.69 MIL/uL — ABNORMAL LOW (ref 3.87–5.11)
RDW: 15.9 % — ABNORMAL HIGH (ref 11.5–15.5)
WBC: 7.2 10*3/uL (ref 4.0–10.5)
nRBC: 0 % (ref 0.0–0.2)

## 2023-04-09 LAB — BASIC METABOLIC PANEL
Anion gap: 12 (ref 5–15)
BUN: 76 mg/dL — ABNORMAL HIGH (ref 8–23)
CO2: 27 mmol/L (ref 22–32)
Calcium: 8.3 mg/dL — ABNORMAL LOW (ref 8.9–10.3)
Chloride: 102 mmol/L (ref 98–111)
Creatinine, Ser: 4.9 mg/dL — ABNORMAL HIGH (ref 0.44–1.00)
GFR, Estimated: 9 mL/min — ABNORMAL LOW (ref >60)
Glucose, Bld: 103 mg/dL — ABNORMAL HIGH (ref 70–99)
Potassium: 3.6 mmol/L (ref 3.5–5.1)
Sodium: 141 mmol/L (ref 135–145)

## 2023-04-09 LAB — MAGNESIUM: Magnesium: 2.1 mg/dL (ref 1.7–2.4)

## 2023-04-09 MED ORDER — FUROSEMIDE 10 MG/ML IJ SOLN
60.0000 mg | Freq: Once | INTRAMUSCULAR | Status: AC
  Administered 2023-04-09: 60 mg via INTRAVENOUS
  Filled 2023-04-09: qty 6

## 2023-04-09 MED ORDER — CLONIDINE HCL 0.1 MG/24HR TD PTWK
0.1000 mg | MEDICATED_PATCH | TRANSDERMAL | Status: DC
  Administered 2023-04-09: 0.1 mg via TRANSDERMAL
  Filled 2023-04-09: qty 1

## 2023-04-09 NOTE — Progress Notes (Signed)
PROGRESS NOTE  [name] UJW:[id number] DOB: May 06, 1946 DOA: 04/06/2023 PCP: Myrlene Broker, MD   LOS: 3 days   Brief Narrative / Interim history: 77 year old female with history of neck cancer status post laryngectomy now with chronic trach, CKD 5, chronic diastolic CHF, COPD, history of CVA, prior DVT, hypothyroidism, PAF, CAD who comes into the hospital with shortness of breath and generalized weakness.  Subjective / 24h Interval events: She feels well, denies any shortness of breath.  Still on oxygen at 4 L.  Assesement and Plan: Principal Problem:   CHF exacerbation (HCC) Active Problems:   Hypertension   COPD (chronic obstructive pulmonary disease) (HCC)   Chronic pain syndrome   CKD (chronic kidney disease) stage 5, GFR less than 15 ml/min (HCC)   PAD (peripheral artery disease) (HCC)   Hypothyroidism   History of head and neck cancer-s/p laryngectomy-with tracheostomy   PAF (paroxysmal atrial fibrillation)-CHADS2 Vas score of 5   History of DVT (deep vein thrombosis)   Anemia in CKD (chronic kidney disease)   Alaryngeal voice   History of CVA (cerebrovascular accident)   Chronic diastolic (congestive) heart failure (HCC)   Principal problem Acute hypoxic respiratory failure due to acute on chronic diastolic CHF -chest x-ray on admission showed a degree of pulmonary edema with cardiomegaly.  She has been placed on Lasix, and has received it daily.  -Normally she is on room air, currently on 4 L this morning.  Continue to wean off as tolerated.  A 2D echo was done which showed normal EF at 60-65%, no WMA, grade 2 diastolic dysfunction,.  RV was normal, there was moderately elevated PA pressure at 52.6. -Chest x-ray this morning again with evidence of CHF with small bibasilar effusions.  Repeat Lasix at 59 today.    Active problems Anemia of chronic kidney disease -hemoglobin overall trending down, no obvious bleeding.  She is complaining of worsening back  pain, CT scan of the abdomen ruled out retroperitoneal bleed.  Received 1 unit of packed blood cells with improvement in her hemoglobin  History of neck cancer, laryngectomy -appears at baseline, however she is requiring oxygen here  CKD 5 -baseline creatinine in the 4 range, as high as 4.7 last November.  Close to baseline at this point  PAD -continue cilostazol  Essential hypertension -continue home regimen as below  GAD, MDD -continue home lorazepam, mirtazapine, duloxetine  Hypothyroidism -continue home Synthroid  Renal lesions -neoplasm could not be excluded, may need to have additional workup as an outpatient  Scheduled Meds:  apixaban  2.5 mg Oral BID   aspirin EC  81 mg Oral Daily   carvedilol  25 mg Oral BID WC   cilostazol  100 mg Oral BID   [START ON 04/11/2023] cloNIDine  0.1 mg Transdermal Weekly   DULoxetine  30 mg Oral Daily   furosemide  60 mg Intravenous Once   hydrALAZINE  100 mg Oral Q8H   And   hydrALAZINE  25 mg Oral Q8H   isosorbide mononitrate  60 mg Oral Daily   levothyroxine  50 mcg Oral QAC breakfast   minoxidil  2.5 mg Oral BID   mirtazapine  15 mg Oral QHS   pantoprazole  40 mg Oral Daily   rosuvastatin  20 mg Oral Daily   sodium bicarbonate  650 mg Oral TID   sodium chloride flush  3 mL Intravenous Q12H   Continuous Infusions:  sodium chloride 250 mL (04/07/23 0956)   PRN Meds:.sodium chloride, acetaminophen,  alum & mag hydroxide-simeth, ipratropium-albuterol, labetalol, LORazepam, ondansetron (ZOFRAN) IV, sodium chloride flush, sodium chloride HYPERTONIC  Current Outpatient Medications  Medication Instructions   acetaminophen-codeine (TYLENOL #3) 300-30 MG tablet 1 tablet, Oral, Every 6 hours PRN   aspirin EC 81 mg, Oral, Daily, Swallow whole.   Camphor-Menthol-Methyl Sal (SALONPAS EX) 1 patch, Apply externally, As needed   carvedilol (COREG) 25 MG tablet TAKE 1 TABLET (25 MG TOTAL) BY MOUTH TWICE A DAY WITH MEALS   cilostazol (PLETAL) 100  MG tablet TAKE 1 TABLET BY MOUTH TWICE A DAY   cloNIDine (CATAPRES - DOSED IN MG/24 HR) 0.1 mg, Transdermal, Weekly   cyclobenzaprine (FLEXERIL) 5 MG tablet TAKE 0.5 TABLETS (2.5 MG TOTAL) BY MOUTH AT BEDTIME AS NEEDED FOR MUSCLE SPASMS.   diphenoxylate-atropine (LOMOTIL) 2.5-0.025 MG tablet 1 tablet, Oral, Daily PRN   DULoxetine (CYMBALTA) 20 MG capsule Oral, Daily   DULoxetine (CYMBALTA) 30 mg, Oral, Daily   Eliquis 2.5 mg, Oral, 2 times daily   furosemide (LASIX) 80 mg, Oral, 2 times daily, For increase edema   guaiFENesin (MUCINEX) 1,200 mg, Oral, 2 times daily   hydrALAZINE (APRESOLINE) 100 MG tablet Take 1 pill 3 times a day (total dose 125 mg with 25 mg tablet)   hydrALAZINE (APRESOLINE) 25 MG tablet Give 1 pill TID (1 of 100 mg and 1 of 25 mg tablets 3 times a day for total dose 125 mg TID) Hold if SBP less than 120   ipratropium-albuterol (DUONEB) 0.5-2.5 (3) MG/3ML SOLN 3 mLs, Nebulization, 2 times daily   isosorbide mononitrate (IMDUR) 60 mg, Oral, Daily   levothyroxine (SYNTHROID) 50 mcg, Oral, Daily before breakfast, Additional refill from pcp   LORazepam (ATIVAN) 1 mg, Oral, 2 times daily PRN   minoxidil (LONITEN) 2.5 mg, Oral, 2 times daily   mirtazapine (REMERON) 15 MG tablet TAKE 1 TABLET BY MOUTH EVERYDAY AT BEDTIME   nitroGLYCERIN (NITROSTAT) 0.4 mg, Sublingual, Every 5 min x3 PRN   omeprazole (PRILOSEC) 40 mg, Oral, Daily   ondansetron (ZOFRAN) 4 MG tablet TAKE 1 TABLET BY MOUTH EVERY 8 HOURS AS NEEDED FOR NAUSEA AND VOMITING   ondansetron (ZOFRAN) 40 MG/20ML SOLN injection Inject 2 mL intramuscularly every 6 hours as needed for nausea or vomiting   PE-diphenhydrAMINE-DM-GG-APAP (DELSYM DAY NIGHT PO) 20 mLs, Oral, 2 times daily PRN   rosuvastatin (CRESTOR) 20 mg, Oral, Daily   sodium bicarbonate 650 mg, Oral, 3 times daily   SYRINGE-NEEDLE, DISP, 3 ML (B-D INTEGRA SYRINGE) 23G X 1" 3 ML MISC Use with zofran injections    Diet Orders (From admission, onward)     Start      Ordered   04/06/23 1228  Diet renal with fluid restriction Fluid restriction: 1800 mL Fluid; Room service appropriate? Yes; Fluid consistency: Thin  Diet effective now       Question Answer Comment  Fluid restriction: 1800 mL Fluid   Room service appropriate? Yes   Fluid consistency: Thin      04/06/23 1227            DVT prophylaxis: apixaban (ELIQUIS) tablet 2.5 mg Start: 04/06/23 2200 apixaban (ELIQUIS) tablet 2.5 mg   Lab Results  Component Value Date   PLT 186 04/09/2023      Code Status: DNR  Family Communication: daughter at bedside   Status is: Inpatient  Remains inpatient appropriate because: severity of illness  Level of care: Med-Surg  Consultants:  none  Objective: Vitals:   04/08/23 2024 04/08/23 2105 04/09/23  0515 04/09/23 0621  BP:  (!) 161/59 (!) 132/54   Pulse: 81 80 80   Resp: 20 16 16    Temp:  99.1 F (37.3 C) 99 F (37.2 C)   TempSrc:  Oral Oral   SpO2: 94% 96% 100% 95%  Weight:      Height:        Intake/Output Summary (Last 24 hours) at 04/09/2023 1108 Last data filed at 04/09/2023 0900 Gross per 24 hour  Intake 240 ml  Output 650 ml  Net -410 ml    Wt Readings from Last 3 Encounters:  04/06/23 51.7 kg  04/06/23 51.7 kg  03/12/23 51.7 kg    Examination: Constitutional: NAD Eyes: lids and conjunctivae normal, no scleral icterus ENMT: mmm Neck: normal, supple Respiratory: Faint bibasilar crackles, no wheezing Cardiovascular: Regular rate and rhythm, no murmurs / rubs / gallops. No LE edema. Abdomen: soft, no distention, no tenderness. Bowel sounds positive.    Data Reviewed: I have independently reviewed following labs and imaging studies   CBC Recent Labs  Lab 04/06/23 0946 04/07/23 0421 04/08/23 0635 04/09/23 0408  WBC 7.0 7.5 8.0 7.2  HGB 6.7* 6.0* 7.6* 7.2*  HCT 21.5* 19.3* 23.6* 23.0*  PLT 187 189 197 186  MCV 88.8 88.9 84.9 85.5  MCH 27.7 27.6 27.3 26.8  MCHC 31.2 31.1 32.2 31.3  RDW 15.5 15.5 15.9*  15.9*  LYMPHSABS 0.8  --   --   --   MONOABS 0.5  --   --   --   EOSABS 0.1  --   --   --   BASOSABS 0.0  --   --   --      Recent Labs  Lab 04/06/23 0946 04/06/23 1203 04/07/23 0421 04/08/23 0635 04/09/23 0408  NA 140  --  141 138 141  K 4.5  --  3.8 3.8 3.6  CL 104  --  104 100 102  CO2 23  --  22 25 27   GLUCOSE 122*  --  111* 108* 103*  BUN 83*  --  81* 79* 76*  CREATININE 4.60*  --  4.68* 4.69* 4.90*  CALCIUM 8.6*  --  8.8* 8.4* 8.3*  AST 18  --   --  14*  --   ALT 12  --   --  10  --   ALKPHOS 69  --   --  65  --   BILITOT 0.5  --   --  0.9  --   ALBUMIN 3.4*  --   --  3.0*  --   MG  --   --   --   --  2.1  LATICACIDVEN 0.9 0.6  --   --   --   BNP 850.0*  --   --   --   --      ------------------------------------------------------------------------------------------------------------------ No results for input(s): "CHOL", "HDL", "LDLCALC", "TRIG", "CHOLHDL", "LDLDIRECT" in the last 72 hours.  Lab Results  Component Value Date   HGBA1C 5.5 08/07/2022   ------------------------------------------------------------------------------------------------------------------ No results for input(s): "TSH", "T4TOTAL", "T3FREE", "THYROIDAB" in the last 72 hours.  Invalid input(s): "FREET3"  Cardiac Enzymes No results for input(s): "CKMB", "TROPONINI", "MYOGLOBIN" in the last 168 hours.  Invalid input(s): "CK" ------------------------------------------------------------------------------------------------------------------    Component Value Date/Time   BNP 850.0 (H) 04/06/2023 0946    CBG: No results for input(s): "GLUCAP" in the last 168 hours.  Recent Results (from the past 240 hour(s))  SARS Coronavirus 2 by RT PCR (hospital  order, performed in [facility] hospital lab) *cepheid single result test* Anterior Nasal Swab     Status: None   Collection Time: 04/06/23  9:46 AM   Specimen: Anterior Nasal Swab  Result Value Ref Range Status   SARS Coronavirus 2 by  RT PCR NEGATIVE NEGATIVE Final    Comment: (NOTE) SARS-CoV-2 target nucleic acids are NOT DETECTED.  The SARS-CoV-2 RNA is generally detectable in upper and lower respiratory specimens during the acute phase of infection. The lowest concentration of SARS-CoV-2 viral copies this assay can detect is 250 copies / mL. A negative result does not preclude SARS-CoV-2 infection and should not be used as the sole basis for treatment or other patient management decisions.  A negative result may occur with improper specimen collection / handling, submission of specimen other than nasopharyngeal swab, presence of viral mutation(s) within the areas targeted by this assay, and inadequate number of viral copies (<250 copies / mL). A negative result must be combined with clinical observations, patient history, and epidemiological information.  Fact Sheet for Patients:   RoadLapTop.co.za  Fact Sheet for Healthcare Providers: http://kim-miller.com/  This test is not yet approved or  cleared by the [place] FDA and has been authorized for detection and/or diagnosis of SARS-CoV-2 by FDA under an Emergency Use Authorization (EUA).  This EUA will remain in effect (meaning this test can be used) for the duration of the COVID-19 declaration under Section 564(b)(1) of the Act, 21 U.S.C. section 360bbb-3(b)(1), unless the authorization is terminated or revoked sooner.  Performed at [facility], [address] [place], [place] [address]   Culture, blood (routine x 2)     Status: None (Preliminary result)   Collection Time: 04/06/23  9:46 AM   Specimen: BLOOD  Result Value Ref Range Status   Specimen Description   Final    BLOOD BLOOD RIGHT ARM Performed at [facility], [address], [place], [place] [address]    Special Requests   Final    BOTTLES DRAWN AEROBIC AND ANAEROBIC Blood Culture adequate  volume Performed at [facility], [address], [place], [place] [address]    Culture   Final    NO GROWTH 3 DAYS Performed at [facility], [address], [place], [place] [address]    Report Status PENDING  Incomplete  Culture, blood (routine x 2)     Status: None (Preliminary result)   Collection Time: 04/06/23 10:06 AM   Specimen: BLOOD  Result Value Ref Range Status   Specimen Description   Final    BLOOD BLOOD RIGHT FOREARM Performed at [facility], [address], [place], [place] [address]    Special Requests   Final    BOTTLES DRAWN AEROBIC AND ANAEROBIC Blood Culture adequate volume Performed at [facility], [address] [place], [place] [address]    Culture   Final    NO GROWTH 3 DAYS Performed at [facility], [address] [place], [place] [address]    Report Status PENDING  Incomplete     Radiology Studies: DG Chest 2 View  Result Date: 04/09/2023 CLINICAL DATA:  Pleural effusion EXAM: CHEST - 2 VIEW COMPARISON:  04/06/2023 FINDINGS: Enlargement of cardiac silhouette. Atherosclerotic calcification aorta. Diffuse interstitial infiltrates likely pulmonary edema, little changed. Small bibasilar pleural effusions greater on RIGHT. No pneumothorax. Bones demineralized. IMPRESSION: CHF with small bibasilar effusions. Aortic Atherosclerosis (ICD10-I70.0). Electronically Signed   By:  Ulyses Southward M.D.   On: 04/09/2023 11:04   ECHOCARDIOGRAM COMPLETE  Result Date: 04/08/2023    ECHOCARDIOGRAM REPORT   Patient Name:   [name] Date of Exam: 04/08/2023 Medical Rec #:  [id number]        Height:       62.0 in Accession #:    [id number]       Weight:       114.0 lb Date of Birth:  Nov 20, 1946       BSA:          1.505 m Patient Age:    76 years         BP:           177/74 mmHg Patient Gender: F                HR:           50 bpm. Exam Location:  Inpatient Procedure: 2D Echo, Cardiac Doppler and  Color Doppler Indications:    Pericardial Effusion  History:        Patient has prior history of Echocardiogram examinations, most                 recent 10/29/2022. CHF, COPD, PAD and Stroke, Arrythmias:Atrial                 Fibrillation; Risk Factors:Hypertension.  Sonographer:    Wallie Char Referring Phys: [id number] Daylene Katayama Minas Bonser IMPRESSIONS  1. Left ventricular ejection fraction, by estimation, is 60 to 65%. The left ventricle has normal function. The left ventricle has no regional wall motion abnormalities. There is mild concentric left ventricular hypertrophy. Left ventricular diastolic parameters are consistent with Grade II diastolic dysfunction (pseudonormalization). Elevated left ventricular end-diastolic pressure.  2. Right ventricular systolic function is normal. The right ventricular size is normal. There is moderately elevated pulmonary artery systolic pressure. The estimated right ventricular systolic pressure is 52.6 mmHg.  3. Left atrial size was severely dilated.  4. Right atrial size was severely dilated.  5. The mitral valve is degenerative. Trivial mitral valve regurgitation. No evidence of mitral stenosis.  6. The aortic valve is normal in structure. Aortic valve regurgitation is not visualized. No aortic stenosis is present. Aortic valve mean gradient measures 6.5 mmHg. Aortic valve Vmax measures 1.82 m/s. The AVA is underestimated due to small LVOT measurement.  7. The inferior vena cava is dilated in size with >50% respiratory variability, suggesting right atrial pressure of 8 mmHg.  8. A small pericardial effusion is present. The pericardial effusion is circumferential. Large pleural effusion in the left lateral region. FINDINGS  Left Ventricle: Left ventricular ejection fraction, by estimation, is 60 to 65%. The left ventricle has normal function. The left ventricle has no regional wall motion abnormalities. The left ventricular internal cavity size was normal in size. There is  mild  concentric left ventricular hypertrophy. Left ventricular diastolic parameters are consistent with Grade II diastolic dysfunction (pseudonormalization). Elevated left ventricular end-diastolic pressure. Right Ventricle: The right ventricular size is normal. No increase in right ventricular wall thickness. Right ventricular systolic function is normal. There is moderately elevated pulmonary artery systolic pressure. The tricuspid regurgitant velocity is 3.34 m/s, and with an assumed right atrial pressure of 8 mmHg, the estimated right ventricular systolic pressure is 52.6 mmHg. Left Atrium: Left atrial size was severely dilated. Right Atrium: Right atrial size was severely dilated. Pericardium: A small pericardial effusion is present. The pericardial effusion is circumferential. Mitral Valve:  The mitral valve is degenerative in appearance. There is mild calcification of the mitral valve leaflet(s). Trivial mitral valve regurgitation. No evidence of mitral valve stenosis. MV peak gradient, 8.5 mmHg. The mean mitral valve gradient  is 5.0 mmHg. Tricuspid Valve: The tricuspid valve is normal in structure. Tricuspid valve regurgitation is mild . No evidence of tricuspid stenosis. Aortic Valve: The aortic valve is normal in structure. Aortic valve regurgitation is not visualized. No aortic stenosis is present. Aortic valve mean gradient measures 6.5 mmHg. Aortic valve peak gradient measures 13.2 mmHg. Aortic valve area, by VTI measures 1.36 cm. Pulmonic Valve: The pulmonic valve was normal in structure. Pulmonic valve regurgitation is mild. No evidence of pulmonic stenosis. Aorta: The aortic root is normal in size and structure. Venous: The inferior vena cava is dilated in size with greater than 50% respiratory variability, suggesting right atrial pressure of 8 mmHg. IAS/Shunts: No atrial level shunt detected by color flow Doppler. Additional Comments: There is a large pleural effusion in the left lateral region.  LEFT  VENTRICLE PLAX 2D LVIDd:         4.70 cm     Diastology LVIDs:         3.10 cm     LV e' medial:    5.72 cm/s LV PW:         1.10 cm     LV E/e' medial:  21.9 LV IVS:        1.10 cm     LV e' lateral:   7.66 cm/s LVOT diam:     1.50 cm     LV E/e' lateral: 16.3 LV SV:         52 LV SV Index:   35 LVOT Area:     1.77 cm  LV Volumes (MOD) LV vol d, MOD A2C: 82.9 ml LV vol d, MOD A4C: 91.2 ml LV vol s, MOD A2C: 29.1 ml LV vol s, MOD A4C: 33.4 ml LV SV MOD A2C:     53.8 ml LV SV MOD A4C:     91.2 ml LV SV MOD BP:      55.6 ml RIGHT VENTRICLE             IVC RV Basal diam:  3.60 cm     IVC diam: 2.50 cm RV S prime:     14.70 cm/s TAPSE (M-mode): 2.2 cm LEFT ATRIUM             Index        RIGHT ATRIUM           Index LA diam:        4.30 cm 2.86 cm/m   RA Area:     19.40 cm LA Vol (A2C):   69.7 ml 46.30 ml/m  RA Volume:   57.30 ml  38.07 ml/m LA Vol (A4C):   70.7 ml 46.97 ml/m LA Biplane Vol: 71.0 ml 47.17 ml/m  AORTIC VALVE AV Area (Vmax):    1.27 cm AV Area (Vmean):   1.35 cm AV Area (VTI):     1.36 cm AV Vmax:           182.00 cm/s AV Vmean:          120.000 cm/s AV VTI:            0.382 m AV Peak Grad:      13.2 mmHg AV Mean Grad:      6.5 mmHg LVOT Vmax:  130.50 cm/s LVOT Vmean:        91.700 cm/s LVOT VTI:          0.294 m LVOT/AV VTI ratio: 0.77  AORTA Ao Root diam: 2.90 cm Ao Asc diam:  3.00 cm MITRAL VALVE                TRICUSPID VALVE MV Area (PHT): 3.91 cm     TR Peak grad:   44.6 mmHg MV Area VTI:   1.50 cm     TR Vmax:        334.00 cm/s MV Peak grad:  8.5 mmHg MV Mean grad:  5.0 mmHg     SHUNTS MV Vmax:       1.46 m/s     Systemic VTI:  0.29 m MV Vmean:      101.0 cm/s   Systemic Diam: 1.50 cm MV Decel Time: 194 msec MV E velocity: 125.00 cm/s MV A velocity: 135.00 cm/s MV E/A ratio:  0.93 Armanda Magic MD Electronically signed by Armanda Magic MD Signature Date/Time: 04/08/2023/11:58:06 AM    Final      Pamella Pert, MD, PhD [facility]  Between 7 am - 7 pm I am available,  please contact me via Amion (for emergencies) or Securechat (non urgent messages)  Between 7 pm - 7 am I am not available, please contact night coverage MD/APP via Amion

## 2023-04-09 NOTE — Progress Notes (Signed)
PT Cancellation Note  Patient Details Name: [name] MRN: [id number] DOB: [birth year]   Cancelled Treatment:    Reason Eval/Treat Not Completed: Patient declined, no reason specified   Tamala Ser PT 04/09/2023  Acute Rehabilitation Services  Office [phone]

## 2023-04-09 NOTE — Care Management Important Message (Signed)
Important Message  Patient Details IM Letter given to family Name: [name] MRN: [id number] Date of Birth: 05-31-1946   Medicare Important Message Given:  Yes     Caren Macadam 04/09/2023, 1:14 PM

## 2023-04-10 DIAGNOSIS — I5033 Acute on chronic diastolic (congestive) heart failure: Secondary | ICD-10-CM | POA: Diagnosis not present

## 2023-04-10 LAB — IRON AND TIBC
Iron: 39 ug/dL (ref 28–170)
Saturation Ratios: 19 % (ref 10.4–31.8)
TIBC: 206 ug/dL — ABNORMAL LOW (ref 250–450)
UIBC: 167 ug/dL

## 2023-04-10 LAB — RENAL FUNCTION PANEL
Albumin: 2.9 g/dL — ABNORMAL LOW (ref 3.5–5.0)
Anion gap: 14 (ref 5–15)
BUN: 78 mg/dL — ABNORMAL HIGH (ref 8–23)
CO2: 24 mmol/L (ref 22–32)
Calcium: 8.4 mg/dL — ABNORMAL LOW (ref 8.9–10.3)
Chloride: 101 mmol/L (ref 98–111)
Creatinine, Ser: 4.98 mg/dL — ABNORMAL HIGH (ref 0.44–1.00)
GFR, Estimated: 8 mL/min — ABNORMAL LOW (ref >60)
Glucose, Bld: 100 mg/dL — ABNORMAL HIGH (ref 70–99)
Phosphorus: 4.7 mg/dL — ABNORMAL HIGH (ref 2.5–4.6)
Potassium: 3.7 mmol/L (ref 3.5–5.1)
Sodium: 139 mmol/L (ref 135–145)

## 2023-04-10 LAB — HEMOGLOBIN AND HEMATOCRIT, BLOOD
HCT: 23 % — ABNORMAL LOW (ref 36.0–46.0)
Hemoglobin: 7.3 g/dL — ABNORMAL LOW (ref 12.0–15.0)

## 2023-04-10 LAB — FERRITIN: Ferritin: 577 ng/mL — ABNORMAL HIGH (ref 11–307)

## 2023-04-10 MED ORDER — ORAL CARE MOUTH RINSE: 15.0000 mL | OROMUCOSAL | Status: DC | PRN

## 2023-04-10 MED ORDER — ACETAMINOPHEN 325 MG PO TABS
650.0000 mg | ORAL_TABLET | Freq: Once | ORAL | Status: AC | PRN
  Administered 2023-04-10: 650 mg via ORAL
  Filled 2023-04-10: qty 2

## 2023-04-10 MED ORDER — MINOXIDIL 2.5 MG PO TABS
2.5000 mg | ORAL_TABLET | Freq: Two times a day (BID) | ORAL | Status: DC
  Administered 2023-04-10 – 2023-04-13 (×6): 2.5 mg via ORAL
  Filled 2023-04-10 (×7): qty 1

## 2023-04-10 MED ORDER — FUROSEMIDE 10 MG/ML IJ SOLN
80.0000 mg | Freq: Once | INTRAMUSCULAR | Status: AC
  Administered 2023-04-10: 80 mg via INTRAVENOUS
  Filled 2023-04-10: qty 8

## 2023-04-10 MED ORDER — MINOXIDIL 2.5 MG PO TABS
5.0000 mg | ORAL_TABLET | Freq: Two times a day (BID) | ORAL | Status: DC
  Administered 2023-04-10: 5 mg via ORAL
  Filled 2023-04-10 (×2): qty 2

## 2023-04-10 MED ORDER — DARBEPOETIN ALFA 40 MCG/0.4ML IJ SOSY
40.0000 ug | PREFILLED_SYRINGE | Freq: Once | INTRAMUSCULAR | Status: AC
  Administered 2023-04-10: 40 ug via SUBCUTANEOUS
  Filled 2023-04-10: qty 0.4

## 2023-04-10 MED ORDER — ORAL CARE MOUTH RINSE
15.0000 mL | OROMUCOSAL | Status: DC
  Administered 2023-04-11 – 2023-04-12 (×8): 15 mL via OROMUCOSAL

## 2023-04-10 NOTE — Progress Notes (Signed)
Vitals taken by Arna Snipe, NT - forgot to log out previous user and log back in

## 2023-04-10 NOTE — Progress Notes (Signed)
Pt doing well at this time. [facility] completed.

## 2023-04-10 NOTE — Progress Notes (Signed)
PROGRESS NOTE  [name] GNF:[id number] DOB: Feb 26, 1946 DOA: 04/06/2023 PCP: Myrlene Broker, MD   LOS: 4 days   Brief Narrative / Interim history: 77 year old female with history of neck cancer status post laryngectomy now with chronic trach, CKD 5, chronic diastolic CHF, COPD, history of CVA, prior DVT, hypothyroidism, PAF, CAD who comes into the hospital with shortness of breath and generalized weakness.  Subjective / 24h Interval events: Planes of nausea today.  She has been nauseous for quite a few days.  Denies any significant dyspnea  Assesement and Plan: Principal Problem:   CHF exacerbation (HCC) Active Problems:   Hypertension   COPD (chronic obstructive pulmonary disease) (HCC)   Chronic pain syndrome   CKD (chronic kidney disease) stage 5, GFR less than 15 ml/min (HCC)   PAD (peripheral artery disease) (HCC)   Hypothyroidism   History of head and neck cancer-s/p laryngectomy-with tracheostomy   PAF (paroxysmal atrial fibrillation)-CHADS2 Vas score of 5   History of DVT (deep vein thrombosis)   Anemia in CKD (chronic kidney disease)   Alaryngeal voice   History of CVA (cerebrovascular accident)   Chronic diastolic (congestive) heart failure (HCC)   Principal problem Acute hypoxic respiratory failure due to acute on chronic diastolic CHF -chest x-ray on admission showed pulmonary edema and cardiomegaly.  2D echocardiogram showed normal EF at 60-65%, no WMA, grade 2 diastolic dysfunction,.  RV was normal, there was moderately elevated PA pressure at 52.6.  -she is normally on room air but has required as much is 6 L.  Has received Lasix over the last few days, including 60 mg yesterday, but urine output has been marginal, only 500 cc yesterday after 60 mg of Lasix.  2 view chest x-ray done on 4/26 shows persistent pulmonary edema.  It sounds like she is not responding as much to diuresis, nephrology consulted, appreciate input.  Discussed with the patient's  daughter today, patient has expressed wishes in the past not to go on dialysis.  Active problems Anemia of chronic kidney disease -hemoglobin overall trending down, no obvious bleeding.  She is complaining of worsening back pain, CT scan of the abdomen ruled out retroperitoneal bleed.  Received 1 unit of packed blood cells with improvement in her hemoglobin and now has remained stable  History of neck cancer, laryngectomy -appears at baseline, however she is requiring oxygen here for reasons detailed above  CKD 5 -baseline creatinine in the 4 range, as high as 4.7 last November.  Close to baseline, however she has very poor urine output despite IV furosemide  PAD -continue cilostazol  Essential hypertension -continue home regimen as below  GAD, MDD -continue home lorazepam, mirtazapine, duloxetine  Hypothyroidism -continue home Synthroid  Renal lesions -neoplasm could not be excluded, may need to have additional workup as an outpatient  Scheduled Meds:  apixaban  2.5 mg Oral BID   aspirin EC  81 mg Oral Daily   carvedilol  25 mg Oral BID WC   cilostazol  100 mg Oral BID   cloNIDine  0.1 mg Transdermal Weekly   DULoxetine  30 mg Oral Daily   hydrALAZINE  100 mg Oral Q8H   And   hydrALAZINE  25 mg Oral Q8H   isosorbide mononitrate  60 mg Oral Daily   levothyroxine  50 mcg Oral QAC breakfast   minoxidil  5 mg Oral BID   mirtazapine  15 mg Oral QHS   pantoprazole  40 mg Oral Daily   rosuvastatin  20  mg Oral Daily   sodium bicarbonate  650 mg Oral TID   sodium chloride flush  3 mL Intravenous Q12H   Continuous Infusions:  sodium chloride 250 mL (04/07/23 0956)   PRN Meds:.sodium chloride, alum & mag hydroxide-simeth, ipratropium-albuterol, labetalol, LORazepam, ondansetron (ZOFRAN) IV, sodium chloride flush, sodium chloride HYPERTONIC  Current Outpatient Medications  Medication Instructions   acetaminophen-codeine (TYLENOL #3) 300-30 MG tablet 1 tablet, Oral, Every 6 hours PRN    aspirin EC 81 mg, Oral, Daily, Swallow whole.   Camphor-Menthol-Methyl Sal (SALONPAS EX) 1 patch, Apply externally, As needed   carvedilol (COREG) 25 MG tablet TAKE 1 TABLET (25 MG TOTAL) BY MOUTH TWICE A DAY WITH MEALS   cilostazol (PLETAL) 100 MG tablet TAKE 1 TABLET BY MOUTH TWICE A DAY   cloNIDine (CATAPRES - DOSED IN MG/24 HR) 0.1 mg, Transdermal, Weekly   cyclobenzaprine (FLEXERIL) 5 MG tablet TAKE 0.5 TABLETS (2.5 MG TOTAL) BY MOUTH AT BEDTIME AS NEEDED FOR MUSCLE SPASMS.   diphenoxylate-atropine (LOMOTIL) 2.5-0.025 MG tablet 1 tablet, Oral, Daily PRN   DULoxetine (CYMBALTA) 20 MG capsule Oral, Daily   DULoxetine (CYMBALTA) 30 mg, Oral, Daily   Eliquis 2.5 mg, Oral, 2 times daily   furosemide (LASIX) 80 mg, Oral, 2 times daily, For increase edema   guaiFENesin (MUCINEX) 1,200 mg, Oral, 2 times daily   hydrALAZINE (APRESOLINE) 100 MG tablet Take 1 pill 3 times a day (total dose 125 mg with 25 mg tablet)   hydrALAZINE (APRESOLINE) 25 MG tablet Give 1 pill TID (1 of 100 mg and 1 of 25 mg tablets 3 times a day for total dose 125 mg TID) Hold if SBP less than 120   ipratropium-albuterol (DUONEB) 0.5-2.5 (3) MG/3ML SOLN 3 mLs, Nebulization, 2 times daily   isosorbide mononitrate (IMDUR) 60 mg, Oral, Daily   levothyroxine (SYNTHROID) 50 mcg, Oral, Daily before breakfast, Additional refill from pcp   LORazepam (ATIVAN) 1 mg, Oral, 2 times daily PRN   minoxidil (LONITEN) 2.5 mg, Oral, 2 times daily   mirtazapine (REMERON) 15 MG tablet TAKE 1 TABLET BY MOUTH EVERYDAY AT BEDTIME   nitroGLYCERIN (NITROSTAT) 0.4 mg, Sublingual, Every 5 min x3 PRN   omeprazole (PRILOSEC) 40 mg, Oral, Daily   ondansetron (ZOFRAN) 4 MG tablet TAKE 1 TABLET BY MOUTH EVERY 8 HOURS AS NEEDED FOR NAUSEA AND VOMITING   ondansetron (ZOFRAN) 40 MG/20ML SOLN injection Inject 2 mL intramuscularly every 6 hours as needed for nausea or vomiting   PE-diphenhydrAMINE-DM-GG-APAP (DELSYM DAY NIGHT PO) 20 mLs, Oral, 2 times daily  PRN   rosuvastatin (CRESTOR) 20 mg, Oral, Daily   sodium bicarbonate 650 mg, Oral, 3 times daily   SYRINGE-NEEDLE, DISP, 3 ML (B-D INTEGRA SYRINGE) 23G X 1" 3 ML MISC Use with zofran injections    Diet Orders (From admission, onward)     Start     Ordered   04/06/23 1228  Diet renal with fluid restriction Fluid restriction: 1800 mL Fluid; Room service appropriate? Yes; Fluid consistency: Thin  Diet effective now       Question Answer Comment  Fluid restriction: 1800 mL Fluid   Room service appropriate? Yes   Fluid consistency: Thin      04/06/23 1227            DVT prophylaxis: apixaban (ELIQUIS) tablet 2.5 mg Start: 04/06/23 2200 apixaban (ELIQUIS) tablet 2.5 mg   Lab Results  Component Value Date   PLT 186 04/09/2023      Code Status: DNR  Family Communication: daughter at bedside   Status is: Inpatient  Remains inpatient appropriate because: severity of illness  Level of care: Med-Surg  Consultants:  none  Objective: Vitals:   04/10/23 0613 04/10/23 0810 04/10/23 0901 04/10/23 0933  BP: (!) 191/74 (!) 163/63    Pulse: 81 80    Resp: 17     Temp: 98.3 F (36.8 C) 98.2 F (36.8 C)    TempSrc: Oral Oral    SpO2: 94% 94% 94% 91%  Weight:      Height:        Intake/Output Summary (Last 24 hours) at 04/10/2023 1109 Last data filed at 04/10/2023 1000 Gross per 24 hour  Intake 760 ml  Output 800 ml  Net -40 ml    Wt Readings from Last 3 Encounters:  04/06/23 51.7 kg  04/06/23 51.7 kg  03/12/23 51.7 kg    Examination:  Constitutional: NAD Eyes: lids and conjunctivae normal, no scleral icterus ENMT: mmm Neck: normal, supple Respiratory: Faint bibasilar crackles, no wheezing Cardiovascular: Regular rate and rhythm, no murmurs / rubs / gallops. No LE edema. Abdomen: soft, no distention, no tenderness. Bowel sounds positive.  Skin: no rashes   Data Reviewed: I have independently reviewed following labs and imaging studies   CBC Recent Labs   Lab 04/06/23 0946 04/07/23 0421 04/08/23 0635 04/09/23 0408 04/10/23 0440  WBC 7.0 7.5 8.0 7.2  --   HGB 6.7* 6.0* 7.6* 7.2* 7.3*  HCT 21.5* 19.3* 23.6* 23.0* 23.0*  PLT 187 189 197 186  --   MCV 88.8 88.9 84.9 85.5  --   MCH 27.7 27.6 27.3 26.8  --   MCHC 31.2 31.1 32.2 31.3  --   RDW 15.5 15.5 15.9* 15.9*  --   LYMPHSABS 0.8  --   --   --   --   MONOABS 0.5  --   --   --   --   EOSABS 0.1  --   --   --   --   BASOSABS 0.0  --   --   --   --      Recent Labs  Lab 04/06/23 0946 04/06/23 1203 04/07/23 0421 04/08/23 0635 04/09/23 0408 04/10/23 0440  NA 140  --  141 138 141 139  K 4.5  --  3.8 3.8 3.6 3.7  CL 104  --  104 100 102 101  CO2 23  --  22 25 27 24   GLUCOSE 122*  --  111* 108* 103* 100*  BUN 83*  --  81* 79* 76* 78*  CREATININE 4.60*  --  4.68* 4.69* 4.90* 4.98*  CALCIUM 8.6*  --  8.8* 8.4* 8.3* 8.4*  AST 18  --   --  14*  --   --   ALT 12  --   --  10  --   --   ALKPHOS 69  --   --  65  --   --   BILITOT 0.5  --   --  0.9  --   --   ALBUMIN 3.4*  --   --  3.0*  --  2.9*  MG  --   --   --   --  2.1  --   LATICACIDVEN 0.9 0.6  --   --   --   --   BNP 850.0*  --   --   --   --   --      ------------------------------------------------------------------------------------------------------------------ No results for  input(s): "CHOL", "HDL", "LDLCALC", "TRIG", "CHOLHDL", "LDLDIRECT" in the last 72 hours.  Lab Results  Component Value Date   HGBA1C 5.5 08/07/2022   ------------------------------------------------------------------------------------------------------------------ No results for input(s): "TSH", "T4TOTAL", "T3FREE", "THYROIDAB" in the last 72 hours.  Invalid input(s): "FREET3"  Cardiac Enzymes No results for input(s): "CKMB", "TROPONINI", "MYOGLOBIN" in the last 168 hours.  Invalid input(s): "CK" ------------------------------------------------------------------------------------------------------------------    Component Value  Date/Time   BNP 850.0 (H) 04/06/2023 0946    CBG: No results for input(s): "GLUCAP" in the last 168 hours.  Recent Results (from the past 240 hour(s))  SARS Coronavirus 2 by RT PCR (hospital order, performed in [facility] hospital lab) *cepheid single result test* Anterior Nasal Swab     Status: None   Collection Time: 04/06/23  9:46 AM   Specimen: Anterior Nasal Swab  Result Value Ref Range Status   SARS Coronavirus 2 by RT PCR NEGATIVE NEGATIVE Final    Comment: (NOTE) SARS-CoV-2 target nucleic acids are NOT DETECTED.  The SARS-CoV-2 RNA is generally detectable in upper and lower respiratory specimens during the acute phase of infection. The lowest concentration of SARS-CoV-2 viral copies this assay can detect is 250 copies / mL. A negative result does not preclude SARS-CoV-2 infection and should not be used as the sole basis for treatment or other patient management decisions.  A negative result may occur with improper specimen collection / handling, submission of specimen other than nasopharyngeal swab, presence of viral mutation(s) within the areas targeted by this assay, and inadequate number of viral copies (<250 copies / mL). A negative result must be combined with clinical observations, patient history, and epidemiological information.  Fact Sheet for Patients:   RoadLapTop.co.za  Fact Sheet for Healthcare Providers: http://kim-miller.com/  This test is not yet approved or  cleared by the [place] FDA and has been authorized for detection and/or diagnosis of SARS-CoV-2 by FDA under an Emergency Use Authorization (EUA).  This EUA will remain in effect (meaning this test can be used) for the duration of the COVID-19 declaration under Section 564(b)(1) of the Act, 21 U.S.C. section 360bbb-3(b)(1), unless the authorization is terminated or revoked sooner.  Performed at [facility], [address] [place], [place] [address]   Culture, blood (routine x 2)     Status: None (Preliminary result)   Collection Time: 04/06/23  9:46 AM   Specimen: BLOOD  Result Value Ref Range Status   Specimen Description   Final    BLOOD BLOOD RIGHT ARM Performed at [facility], [address], [place], [place] [address]    Special Requests   Final    BOTTLES DRAWN AEROBIC AND ANAEROBIC Blood Culture adequate volume Performed at [facility], [address], [place], [place] [address]    Culture   Final    NO GROWTH 4 DAYS Performed at [facility], [address], [place], [place] [address]    Report Status PENDING  Incomplete  Culture, blood (routine x 2)     Status: None (Preliminary result)   Collection Time: 04/06/23 10:06 AM   Specimen: BLOOD  Result Value Ref Range Status   Specimen Description   Final    BLOOD BLOOD RIGHT FOREARM Performed at [facility], [address] [place], [place] [address]    Special Requests   Final    BOTTLES DRAWN AEROBIC AND ANAEROBIC Blood Culture adequate volume Performed at [facility], [address] [place], [place] [address]    Culture   Final    NO GROWTH 4 DAYS Performed at [facility], [address], [place], [place] [address]    Report Status PENDING  Incomplete     Radiology Studies: No results found.   Pamella Pert, MD, PhD [facility]  Between 7 am - 7 pm I am available, please contact me via Amion (for emergencies) or Securechat (non urgent messages)  Between 7 pm - 7 am I am not available, please contact night coverage MD/APP via Amion

## 2023-04-10 NOTE — Consult Note (Signed)
[facility] Renal Consultation Note  Requesting MD: Pamella Pert, MD Indication for Consultation:  AKI on CKD with overload   Chief complaint: shortness of breath  HPI:  [name] is a 77 y.o. female  with a history including CKD stage V, anxiety, HTN, PAD, squamous cell carcinoma of supraglottis area, and uterine cancer who presented to the hospital with shortness of breath.  She was given varying doses of intermittent IV lasix and treated for a CHF exacerbation.  She is usually on lasix 80 mg BID at home.  Note that with regard to other prior history she has a tracheostomy in place.  Nephrology is consulted for assistance with management of advanced CKD and diuresis.  Note that she is on minoxidil 2.5 mg BID.  Her dose was just increased to 5 mg BID today.  Her last BP was 109/48.   She had 500 mL uop charted over 4/26.  She has had 1.1 liters UOP over 4/27 charted thus far.  I requested lasix 80 mg IV once this AM on consult.  She has previously expressed never wanting to go on dialysis.  She has seen Dr. Thedore Mins in our office at [facility].  Most recently, she was seen in the extender clinic on 09/29/22.  She has been lost to follow-up on multiple occasions.  A one-month follow-up after her 09/2022 appointment appears to have been cancelled and then she later missed two appointments.  Prior to the 09/2022 appointment she hadn't been since since 02/2021.  I spoke with her son and daughter in law on the phone/Facetime and they translated for her as she is non-verbal and speaks [place].  She missed one visit and then cancelled an appointment perhaps at the last minute because "it was just so cold that she was afraid to go to the doctor".  She has expressed that she wouldn't want dialysis and they offer that they just don't know that she could take it.  "Sometimes the treatment is worse than the disease".  Last outpatient labs with our office: 09/29/22 - Cr 4.21, BUN 48, eGFR 10, K  5.4, bicarb 16.  She feels well.  She gave me a thumbs up and smiled and blew me a kiss on my way out.    Creatinine, Ser  Date/Time Value Ref Range Status  04/10/2023 04:40 AM 4.98 (H) 0.44 - 1.00 mg/dL Final  16/09/9603 54:09 AM 4.90 (H) 0.44 - 1.00 mg/dL Final  81/19/1478 29:56 AM 4.69 (H) 0.44 - 1.00 mg/dL Final  21/30/8657 84:69 AM 4.68 (H) 0.44 - 1.00 mg/dL Final  62/95/2841 32:44 AM 4.60 (H) 0.44 - 1.00 mg/dL Final  12/16/7251 66:44 AM 4.31 (H) 0.44 - 1.00 mg/dL Final  03/47/4259 56:38 AM 4.63 (HH) 0.40 - 1.20 mg/dL Final  75/64/3329 51:88 AM 4.28 (H) 0.40 - 1.20 mg/dL Final  41/66/0630 16:01 AM 4.70 (HH) 0.40 - 1.20 mg/dL Final  09/32/3557 32:20 AM 4.21 (H) 0.44 - 1.00 mg/dL Final  25/42/7062 37:62 PM 4.13 (H) 0.44 - 1.00 mg/dL Final  83/15/1761 60:73 PM 4.13 (H) 0.44 - 1.00 mg/dL Final  71/05/2693 85:46 PM 3.99 (H) 0.44 - 1.00 mg/dL Final  27/02/5008 38:18 AM 3.95 (H) 0.40 - 1.20 mg/dL Final  29/93/7169 67:89 AM 4.25 (H) 0.40 - 1.20 mg/dL Final  38/09/1750 02:58 AM 4.69 (H) 0.44 - 1.00 mg/dL Final  52/77/8242 35:36 AM 4.00 (H) 0.44 - 1.00 mg/dL Final  14/43/1540 08:67 AM 4.14 (H) 0.44 - 1.00 mg/dL Final  61/95/0932  07:55 AM 3.99 (H) 0.44 - 1.00 mg/dL Final  40/98/1191 47:82 AM 4.09 (H) 0.44 - 1.00 mg/dL Final  95/62/1308 65:78 AM 4.49 (H) 0.44 - 1.00 mg/dL Final  46/96/2952 84:13 AM 5.09 (H) 0.44 - 1.00 mg/dL Final  24/40/1027 25:36 AM 4.88 (H) 0.44 - 1.00 mg/dL Final  64/40/3474 25:95 AM 4.96 (H) 0.44 - 1.00 mg/dL Final  63/87/5643 32:95 AM 4.06 (H) 0.44 - 1.00 mg/dL Final  18/84/1660 63:01 AM 3.68 (H) 0.44 - 1.00 mg/dL Final  60/09/9322 55:73 PM 3.56 (H) 0.44 - 1.00 mg/dL Final  22/01/5426 06:23 AM 3.63 (H) 0.44 - 1.00 mg/dL Final  76/28/3151 76:16 AM 3.20 (H) 0.44 - 1.00 mg/dL Final  07/37/1062 69:48 PM 3.40 (H) 0.44 - 1.00 mg/dL Final  54/62/7035 00:93 PM 3.28 (H) 0.44 - 1.00 mg/dL Final  81/82/9937 16:96 PM 2.68 (H) 0.44 - 1.00 mg/dL Final  78/93/8101 75:10 AM 3.43  (H) 0.44 - 1.00 mg/dL Final  25/85/2778 24:23 AM 3.45 (H) 0.44 - 1.00 mg/dL Final  53/61/4431 54:00 AM 3.16 (H) 0.44 - 1.00 mg/dL Final  86/76/1950 93:26 AM 3.44 (H) 0.44 - 1.00 mg/dL Final  71/24/5809 98:33 AM 2.62 (H) 0.44 - 1.00 mg/dL Final  82/50/5397 67:34 AM 2.68 (H) 0.44 - 1.00 mg/dL Final  19/37/9024 09:73 AM 2.67 (H) 0.44 - 1.00 mg/dL Final  53/29/9242 68:34 AM 2.70 (H) 0.44 - 1.00 mg/dL Final  19/62/2297 98:92 AM 2.51 (H) 0.40 - 1.20 mg/dL Final  11/94/1740 81:44 PM 2.29 (H) 0.44 - 1.00 mg/dL Final  81/85/6314 97:02 AM 2.40 (H) 0.40 - 1.20 mg/dL Final  63/78/5885 02:77 AM 2.57 (H) 0.44 - 1.00 mg/dL Final  41/28/7867 67:20 AM 2.91 (H) 0.44 - 1.00 mg/dL Final  94/70/9628 36:62 PM 2.93 (H) 0.44 - 1.00 mg/dL Final  94/76/5465 03:54 AM 3.28 (H) 0.44 - 1.00 mg/dL Final  65/68/1275 17:00 AM 3.41 (H) 0.44 - 1.00 mg/dL Final  17/49/4496 75:91 PM 3.79 (H) 0.44 - 1.00 mg/dL Final  63/84/6659 93:57 AM 3.76 (H) 0.40 - 1.20 mg/dL Final  01/77/9390 30:09 AM 1.92 (H) 0.44 - 1.00 mg/dL Final  23/30/0762 26:33 AM 1.87 (H) 0.44 - 1.00 mg/dL Final     PMHx:   Past Medical History:  Diagnosis Date   Anemia    Anxiety    takes Ativan prn   Blood transfusion without reported diagnosis 09/15/12   2 units Prbc's   Broken ribs    Chronic back pain    CKD (chronic kidney disease), stage IV (HCC)    Constipation    related to pain meds   COPD (chronic obstructive pulmonary disease) (HCC) 08/10/2012   denies   Depression    Gastrostomy in place [facility])    removed   GERD (gastroesophageal reflux disease)    takes Zantac daily   Headache(784.0)    Hiatal hernia 08/10/2012   History of radiation therapy 10/17/12-11/25/12   supraglottic larynx,high risk neck tumor bed 5880 cGy/28 sessions, high risk lymph node tumor bed 5600 cGy/20 sessions, mod risk lymph node tumor bed 5040 cGy/20 sessions   Hypercholesteremia    takes Pravastatin daily   Hypertension    takes Tribenzor and Atenolol daily    Insomnia    takes Amitriptyline daily   Nausea    takes Zofran prn   PAD (peripheral artery disease) (HCC)    noninvasive imaging in 2016   Pneumonia    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 08/08/2012   Shortness of breath dyspnea  Stroke [facility]) 2011   denies. no residual   Uterine cancer [facility])     Past Surgical History:  Procedure Laterality Date   ABDOMINAL SURGERY     r/t uterine carcinoma   APPENDECTOMY     DIRECT LARYNGOSCOPY N/A 05/22/2014   Procedure: DIRECT LARYNGOSCOPY WITH ESOPHAGEAL DILATION;  Surgeon: Osborn Coho, MD;  Location[facility] OR;  Service: ENT;  Laterality: N/A;   ESOPHAGOSCOPY WITH DILITATION N/A 05/29/2015   Procedure: ESOPHAGOSCOPY WITH ESOPHAGEAL DILITATION;  Surgeon: Osborn Coho, MD;  Location: [facility] OR;  Service: ENT;  Laterality: N/A;   Gastrostomy Tube removed   2013   GASTROSTOMY W/ FEEDING TUBE  13   HERNIA REPAIR     child   LARYNGETOMY  08/31/2012   Procedure: LARYNGECTOMY;  Surgeon: Osborn Coho, MD;  Location: [facility] OR;  Service: ENT;  Laterality: N/A;   LARYNGOSCOPY  08/10/2012   Procedure: LARYNGOSCOPY;  Surgeon: Osborn Coho, MD;  Location: [facility] ORS;  Service: ENT;  Laterality: N/A;  with biopsy   RADICAL NECK DISSECTION  08/31/2012   Procedure: RADICAL NECK DISSECTION;  Surgeon: Osborn Coho, MD;  Location: [facility] OR;  Service: ENT;  Laterality: Bilateral;   TRACHEAL ESOPHOGEAL PUNCTURE WITH REPAIR STOMA N/A 09/08/2013   Procedure: TRACHEAL ESOPHOGEAL PUNCTURE WITH PLACEMENT OF  PROVOX PROSTHESIS ;  Surgeon: Osborn Coho, MD;  Location: [facility] OR;  Service: ENT;  Laterality: N/A;   TRACHEOSTOMY TUBE PLACEMENT  08/10/2012   Procedure: TRACHEOSTOMY;  Surgeon: Osborn Coho, MD;  Location: [facility] ORS;  Service: ENT;  Laterality: N/A;    Family Hx:  Family History  Problem Relation Age of Onset   Throat cancer Father    Brain cancer Brother    CAD Neg Hx     Social History:  reports that she quit smoking about 9 years ago. Her  smoking use included cigarettes. She has a 12.50 pack-year smoking history. She has never used smokeless tobacco. She reports that she does not currently use alcohol. She reports that she does not use drugs.  Allergies:  Allergies  Allergen Reactions   Xyzal [Levocetirizine Dihydrochloride] Itching   Augmentin [Amoxicillin-Pot Clavulanate] Diarrhea and Nausea And Vomiting   Tribenzor [Olmesartan-Amlodipine-Hctz] Other (See Comments)    "Hurt the kidneys"    Medications: Prior to Admission medications   Medication Sig Start Date End Date Taking? Authorizing Provider  acetaminophen-codeine (TYLENOL #3) 300-30 MG tablet TAKE 1 TABLET BY MOUTH EVERY 6 HOURS AS NEEDED Patient taking differently: Take 1 tablet by mouth at bedtime. 02/18/23  Yes Myrlene Broker, MD  aspirin EC 81 MG tablet Take 1 tablet (81 mg total) by mouth daily. Swallow whole. 10/31/22  Yes Regalado, Belkys A, MD  Camphor-Menthol-Methyl Sal (SALONPAS EX) Apply 1 patch topically as needed (pain).   Yes [provider]  carvedilol (COREG) 25 MG tablet TAKE 1 TABLET (25 MG TOTAL) BY MOUTH TWICE A DAY WITH MEALS Patient taking differently: Take 25 mg by mouth in the morning and at bedtime. 12/30/22  Yes Myrlene Broker, MD  cilostazol (PLETAL) 100 MG tablet TAKE 1 TABLET BY MOUTH TWICE A DAY Patient taking differently: Take 100 mg by mouth 2 (two) times daily. 01/18/23  Yes Myrlene Broker, MD  cloNIDine (CATAPRES - DOSED IN MG/24 HR) 0.1 mg/24hr patch Place 1 patch (0.1 mg total) onto the skin once a week. 09/18/22  Yes Ghimire, Lyndel Safe, MD  cyclobenzaprine (FLEXERIL) 5 MG tablet TAKE 0.5 TABLETS (2.5 MG TOTAL) BY MOUTH AT BEDTIME AS NEEDED  FOR MUSCLE SPASMS. Patient taking differently: Take 5 mg by mouth at bedtime. 01/18/23  Yes Myrlene Broker, MD  diphenoxylate-atropine (LOMOTIL) 2.5-0.025 MG tablet Take 1 tablet by mouth daily as needed for diarrhea or loose stools. 12/30/22  Yes Myrlene Broker,  MD  DULoxetine (CYMBALTA) 30 MG capsule Take 1 capsule (30 mg total) by mouth daily. 11/17/22  Yes Myrlene Broker, MD  ELIQUIS 2.5 MG TABS tablet TAKE 1 TABLET BY MOUTH TWICE A DAY 03/26/23  Yes Myrlene Broker, MD  furosemide (LASIX) 40 MG tablet Take 2 tablets (80 mg total) by mouth 2 (two) times daily. For increase edema Patient taking differently: Take 40-80 mg by mouth 2 (two) times daily. For increase edema 03/10/23  Yes Myrlene Broker, MD  hydrALAZINE (APRESOLINE) 100 MG tablet Take 1 pill 3 times a day (total dose 125 mg with 25 mg tablet) Patient taking differently: Take 100 mg by mouth 3 (three) times daily. Take 100 mg 3 times a day (total dose 125 mg with 25 mg tablet) 03/10/23  Yes Myrlene Broker, MD  hydrALAZINE (APRESOLINE) 25 MG tablet Give 1 pill TID (1 of 100 mg and 1 of 25 mg tablets 3 times a day for total dose 125 mg TID) Hold if SBP less than 120 Patient taking differently: Take 25 mg by mouth 3 (three) times daily. Take 25 mg 3 times daily. Take with 100 mg to equal 125 mg 03/10/23  Yes Myrlene Broker, MD  isosorbide mononitrate (IMDUR) 60 MG 24 hr tablet TAKE 1 TABLET BY MOUTH EVERY DAY 11/10/22  Yes Myrlene Broker, MD  levothyroxine (SYNTHROID) 50 MCG tablet Take 1 tablet (50 mcg total) by mouth daily before breakfast. Additional refill from pcp 10/20/22  Yes Nche, Bonna Gains, NP  LORazepam (ATIVAN) 1 MG tablet TAKE 1 TABLET BY MOUTH 2 TIMES DAILY AS NEEDED FOR ANXIETY. Patient taking differently: Take 1 mg by mouth 2 (two) times daily. 03/01/23  Yes Myrlene Broker, MD  minoxidil (LONITEN) 2.5 MG tablet Take 1 tablet (2.5 mg total) by mouth 2 (two) times daily. 10/23/22  Yes Myrlene Broker, MD  mirtazapine (REMERON) 15 MG tablet TAKE 1 TABLET BY MOUTH EVERYDAY AT BEDTIME Patient taking differently: Take 15 mg by mouth at bedtime. 03/22/23  Yes Myrlene Broker, MD  nitroGLYCERIN (NITROSTAT) 0.4 MG SL tablet Place 1 tablet  (0.4 mg total) under the tongue every 5 (five) minutes x 3 doses as needed for chest pain. 10/31/22  Yes Regalado, Belkys A, MD  omeprazole (PRILOSEC) 40 MG capsule Take 1 capsule (40 mg total) by mouth daily. 03/10/23  Yes Myrlene Broker, MD  ondansetron (ZOFRAN) 4 MG tablet TAKE 1 TABLET BY MOUTH EVERY 8 HOURS AS NEEDED FOR NAUSEA AND VOMITING Patient taking differently: Take 4 mg by mouth as needed for nausea or vomiting. 01/15/23  Yes Myrlene Broker, MD  ondansetron [facility]) 40 MG/20ML SOLN injection Inject 2 mL intramuscularly every 6 hours as needed for nausea or vomiting Patient taking differently: Inject 2 mLs into the vein as needed for nausea or vomiting. 03/10/23  Yes Myrlene Broker, MD  PE-diphenhydrAMINE-DM-GG-APAP (DELSYM DAY NIGHT PO) Take 20 mLs by mouth 2 (two) times daily as needed (cough).   Yes [provider]  rosuvastatin (CRESTOR) 20 MG tablet TAKE 1 TABLET BY MOUTH EVERY DAY 02/18/23  Yes Myrlene Broker, MD  sodium bicarbonate 650 MG tablet Take 650 mg by mouth 3 (three) times  daily. 10/01/22  Yes [provider]  DULoxetine (CYMBALTA) 20 MG capsule TAKE 1 CAPSULE BY MOUTH EVERY DAY Patient not taking: Reported on 03/10/2023 01/18/23   Myrlene Broker, MD  guaiFENesin (MUCINEX) 600 MG 12 hr tablet Take 2 tablets (1,200 mg total) by mouth 2 (two) times daily. Patient not taking: Reported on 03/10/2023 10/31/22   Regalado, Jon Billings A, MD  ipratropium-albuterol (DUONEB) 0.5-2.5 (3) MG/3ML SOLN Take 3 mLs by nebulization 2 (two) times daily. Patient not taking: Reported on 03/10/2023 10/31/22   Regalado, Jon Billings A, MD  SYRINGE-NEEDLE, DISP, 3 ML (B-D INTEGRA SYRINGE) 23G X 1" 3 ML MISC Use with zofran injections 03/10/23   Myrlene Broker, MD  levocetirizine (XYZAL) 5 MG tablet TAKE 1 TABLET(5 MG) BY MOUTH EVERY EVENING 02/21/20 06/10/20  Etta Grandchild, MD   I have reviewed the patient's current and reported prior to admission  medications.  Labs:     Latest Ref Rng & Units 04/10/2023    4:40 AM 04/09/2023    4:08 AM 04/08/2023    6:35 AM  BMP  Glucose 70 - 99 mg/dL 161  096  045   BUN 8 - 23 mg/dL 78  76  79   Creatinine 0.44 - 1.00 mg/dL 4.09  8.11  9.14   Sodium 135 - 145 mmol/L 139  141  138   Potassium 3.5 - 5.1 mmol/L 3.7  3.6  3.8   Chloride 98 - 111 mmol/L 101  102  100   CO2 22 - 32 mmol/L 24  27  25    Calcium 8.9 - 10.3 mg/dL 8.4  8.3  8.4     Urinalysis    Component Value Date/Time   COLORURINE YELLOW 10/29/2022 0358   APPEARANCEUR CLEAR 10/29/2022 0358   LABSPEC 1.010 10/29/2022 0358   PHURINE 6.0 10/29/2022 0358   GLUCOSEU NEGATIVE 10/29/2022 0358   GLUCOSEU NEGATIVE 08/11/2021 0942   HGBUR NEGATIVE 10/29/2022 0358   BILIRUBINUR NEGATIVE 10/29/2022 0358   BILIRUBINUR Negative 01/19/2019 1510   KETONESUR NEGATIVE 10/29/2022 0358   PROTEINUR >=300 (A) 10/29/2022 0358   UROBILINOGEN 0.2 08/11/2021 0942   NITRITE NEGATIVE 10/29/2022 0358   LEUKOCYTESUR NEGATIVE 10/29/2022 0358     ROS:  Pertinent items noted in HPI and remainder of comprehensive ROS otherwise negative.  Physical Exam: Vitals:   04/10/23 1610 04/10/23 2021  BP:    Pulse:    Resp:    Temp:    SpO2: 90% 93%     General: elderly female in bed in NAD, frail appearing   HEENT: NCAT Eyes: EOMI sclera anicteric Neck: supple trachea midline  Heart: S1S2 no rub Lungs: clear but reduced on auscultation; on room air; normal work of breathing Abdomen: soft/nt/nd Extremities: no edema appreciated; no cyanosis or clubbing  Skin: no rash on extremities exposed  Neuro: alert and interactive. Nonverbal. Nods or shakes head appropriately; makes hand motions, smiles.   Assessment/Plan:  # CKD stage V - Noted that she has reported that she would never want dialysis.  She has followed sporadically with [facility] in keeping with that reasoning - Near her baseline renal function this admission - If she is not to do HD  and has not seen palliative care outpatient recently then would re-involve them (can do on an outpatient basis)  # Acute hypoxic respiratory failure with bilateral pleural effusions - She is normally on room air via and has a trach - s/p Lasix 80 mg IV once this AM  -  Diuretics as above to optimize cardiopulmonary status  - Bilateral pleural effusions in the setting of overload.  Note with concurrent minoxidil use.  Goal would be to come off of minoxidil   # Tracheostomy in situ  - she is now on room air on my exam and breathing comfortably    # HTN  - Reduce minoxidil back to 2.5 mg BID - last BP was 109/48 - Continue other current regimen  - Diuretics as above   # Anemia of CKD  - s/p PRBC's - We discussed risks/benefits/indications for ESA and she does consent to ESA - Give aranesp 40 mcg once today.  Check iron panel  - Her family confirms that she has no active known cancer   # Hyperphosphatemia - Acceptable control   # Renal lesions - Noted on CT  - Assess outpatient - triage for now  Thank you for the consult.  Please do not hesitate to contact me with any questions regarding our patient.  She is near her renal baseline and on room air.  I will set up a follow-up appointment in 1-2 weeks with [facility] with Dr. Thedore Mins or the extender clinic.   Estanislado Emms 04/10/2023, 9:58 PM

## 2023-04-10 NOTE — Progress Notes (Signed)
Spoke with Dr. Elvera Lennox - patient did not require oxygen prior to admission currently on 28% and 6L. Will remove oxygen to see if patient will tolerate. SPO2 92-93 on RA. Will continue to monitor on RA to maintain SPO2 >92% Patient is on continuous SPO2 bedside monitor.

## 2023-04-10 NOTE — Progress Notes (Signed)
Walking SPO2 test with patient. Spo2 >92 with ambulation on room air. No signs of acute respiratory distress. Dr Elvera Lennox witnessed patient ambulating in hall.

## 2023-04-11 DIAGNOSIS — I5033 Acute on chronic diastolic (congestive) heart failure: Secondary | ICD-10-CM | POA: Diagnosis not present

## 2023-04-11 LAB — RENAL FUNCTION PANEL
Albumin: 3.2 g/dL — ABNORMAL LOW (ref 3.5–5.0)
Anion gap: 17 — ABNORMAL HIGH (ref 5–15)
BUN: 81 mg/dL — ABNORMAL HIGH (ref 8–23)
CO2: 21 mmol/L — ABNORMAL LOW (ref 22–32)
Calcium: 8.5 mg/dL — ABNORMAL LOW (ref 8.9–10.3)
Chloride: 100 mmol/L (ref 98–111)
Creatinine, Ser: 5.19 mg/dL — ABNORMAL HIGH (ref 0.44–1.00)
GFR, Estimated: 8 mL/min — ABNORMAL LOW (ref >60)
Glucose, Bld: 104 mg/dL — ABNORMAL HIGH (ref 70–99)
Phosphorus: 4.8 mg/dL — ABNORMAL HIGH (ref 2.5–4.6)
Potassium: 3.4 mmol/L — ABNORMAL LOW (ref 3.5–5.1)
Sodium: 138 mmol/L (ref 135–145)

## 2023-04-11 LAB — CBC
HCT: 24.3 % — ABNORMAL LOW (ref 36.0–46.0)
Hemoglobin: 7.9 g/dL — ABNORMAL LOW (ref 12.0–15.0)
MCH: 27.3 pg (ref 26.0–34.0)
MCHC: 32.5 g/dL (ref 30.0–36.0)
MCV: 84.1 fL (ref 80.0–100.0)
Platelets: 206 10*3/uL (ref 150–400)
RBC: 2.89 MIL/uL — ABNORMAL LOW (ref 3.87–5.11)
RDW: 15.3 % (ref 11.5–15.5)
WBC: 6.9 10*3/uL (ref 4.0–10.5)
nRBC: 0 % (ref 0.0–0.2)

## 2023-04-11 LAB — CULTURE, BLOOD (ROUTINE X 2)
Culture: NO GROWTH
Culture: NO GROWTH
Special Requests: ADEQUATE
Special Requests: ADEQUATE

## 2023-04-11 LAB — MAGNESIUM: Magnesium: 2 mg/dL (ref 1.7–2.4)

## 2023-04-11 MED ORDER — ACETAMINOPHEN 325 MG PO TABS
650.0000 mg | ORAL_TABLET | Freq: Once | ORAL | Status: AC | PRN
  Administered 2023-04-11: 650 mg via ORAL
  Filled 2023-04-11: qty 2

## 2023-04-11 MED ORDER — FUROSEMIDE 40 MG PO TABS
80.0000 mg | ORAL_TABLET | Freq: Two times a day (BID) | ORAL | Status: DC
  Administered 2023-04-11 – 2023-04-13 (×5): 80 mg via ORAL
  Filled 2023-04-11 (×5): qty 2

## 2023-04-11 MED ORDER — SODIUM CHLORIDE 0.9 % IV SOLN
125.0000 mg | Freq: Every day | INTRAVENOUS | Status: DC
  Administered 2023-04-11 – 2023-04-13 (×3): 125 mg via INTRAVENOUS
  Filled 2023-04-11 (×3): qty 10

## 2023-04-11 NOTE — Progress Notes (Signed)
Subjective:  1600 of UOP  -  BUN and crt up a little bit- she is nauseated- unclear when last lasix dosing was-  oldest son at bedside-  we discussed prognosis   Objective Vital signs in last 24 hours: Vitals:   04/11/23 0451 04/11/23 0605 04/11/23 0821 04/11/23 0950  BP: (!) 115/58 (!) 152/54    Pulse: 83 76    Resp: 20     Temp: 98.3 F (36.8 C)     TempSrc: Oral     SpO2: 93%  93% 94%  Weight:      Height:       Weight change:   Intake/Output Summary (Last 24 hours) at 04/11/2023 1113 Last data filed at 04/11/2023 0950 Gross per 24 hour  Intake 360 ml  Output 1800 ml  Net -1440 ml    Assessment/Plan:   # CKD stage V - Noted that she has reported that she would never want dialysis-  this has been confirmed .  She has followed sporadically with [facility] in keeping with that reasoning - Near her baseline renal function this admission which is bad and some worse than yesterday today  - If she is not to do HD and has not seen palliative care outpatient recently then would re-involve them (can do on an outpatient basis)  she appears to be having uremic sxms   # Acute hypoxic respiratory failure with bilateral pleural effusions - She is normally on room air and has a trach - s/p Lasix 80 mg IV once this AM  - Diuretics PRN to optimize cardiopulmonary status  - Bilateral pleural effusions in the setting of overload.  Note with concurrent minoxidil use.  Goal would be to come off of minoxidil  She seems euvolemic today-  does not need more lasix at this time   # Tracheostomy in situ  - she is now on room air on my exam and breathing comfortably     # HTN  - Reduce minoxidil to 2.5 mg BID  - Continue other current regimen  - Diuretics as above -  PRN while here-  was on 80 BID as OP-  I might send her out on 40 BID and ideally palliative care could help manage in an op setting    # Anemia of CKD  - s/p PRBC's - Give aranesp 40 mcg once today.  Check iron panel -  iron  low-  will give iron IV    # Hyperphosphatemia - Acceptable control   I dont know how practical it will be for her to follow up with [place] in an OP setting. I spoke to the son-  her life expectancy is likely limited-  I would say weeks to months-  ideally if she could be followed by pall care/hospice to help maintain volume status and sxm control I think that would be best  We dont have much more to add.- call if there are questions   Cecille Aver    Labs: Basic Metabolic Panel: Recent Labs  Lab 04/09/23 0408 04/10/23 0440 04/11/23 0409  NA 141 139 138  K 3.6 3.7 3.4*  CL 102 101 100  CO2 27 24 21*  GLUCOSE 103* 100* 104*  BUN 76* 78* 81*  CREATININE 4.90* 4.98* 5.19*  CALCIUM 8.3* 8.4* 8.5*  PHOS  --  4.7* 4.8*   Liver Function Tests: Recent Labs  Lab 04/06/23 0946 04/08/23 0635 04/10/23 0440 04/11/23 0409  AST 18 14*  --   --  ALT 12 10  --   --   ALKPHOS 69 65  --   --   BILITOT 0.5 0.9  --   --   PROT 7.1 6.3*  --   --   ALBUMIN 3.4* 3.0* 2.9* 3.2*   No results for input(s): "LIPASE", "AMYLASE" in the last 168 hours. No results for input(s): "AMMONIA" in the last 168 hours. CBC: Recent Labs  Lab 04/06/23 0946 04/07/23 0421 04/08/23 0635 04/09/23 0408 04/10/23 0440 04/11/23 0409  WBC 7.0 7.5 8.0 7.2  --  6.9  NEUTROABS 5.5  --   --   --   --   --   HGB 6.7* 6.0* 7.6* 7.2* 7.3* 7.9*  HCT 21.5* 19.3* 23.6* 23.0* 23.0* 24.3*  MCV 88.8 88.9 84.9 85.5  --  84.1  PLT 187 189 197 186  --  206   Cardiac Enzymes: No results for input(s): "CKTOTAL", "CKMB", "CKMBINDEX", "TROPONINI" in the last 168 hours. CBG: No results for input(s): "GLUCAP" in the last 168 hours.  Iron Studies:  Recent Labs    04/10/23 2237  IRON 39  TIBC 206*  FERRITIN 577*   Studies/Results: No results found. Medications: Infusions:  sodium chloride 250 mL (04/07/23 0956)    Scheduled Medications:  apixaban  2.5 mg Oral BID   aspirin EC  81 mg Oral Daily    carvedilol  25 mg Oral BID WC   cilostazol  100 mg Oral BID   cloNIDine  0.1 mg Transdermal Weekly   DULoxetine  30 mg Oral Daily   hydrALAZINE  100 mg Oral Q8H   And   hydrALAZINE  25 mg Oral Q8H   isosorbide mononitrate  60 mg Oral Daily   levothyroxine  50 mcg Oral QAC breakfast   minoxidil  2.5 mg Oral BID   mirtazapine  15 mg Oral QHS   mouth rinse  15 mL Mouth Rinse 4 times per day   pantoprazole  40 mg Oral Daily   rosuvastatin  20 mg Oral Daily   sodium bicarbonate  650 mg Oral TID   sodium chloride flush  3 mL Intravenous Q12H    have reviewed scheduled and prn medications.  Physical Exam: General: alert, is nauseated Heart: RRR Lungs: mostly clear Abdomen: soft, non tender Extremities:no peripheral edema     04/11/2023,11:13 AM  LOS: 5 days

## 2023-04-11 NOTE — Progress Notes (Signed)
Mobility Specialist - Progress Note   04/11/23 1113  Mobility  Activity Ambulated with assistance in hallway  Level of Assistance Contact guard assist, steadying assist  Assistive Device Other (Comment) (HHA)  Distance Ambulated (ft) 350 ft  Activity Response Tolerated well  Mobility Referral Yes  $Mobility charge 1 Mobility   Pt received in bed and agreeable to mobility. Pt required holding on to Mobility Specialist hand for stability due to being weak. Pt to EOB after session with all needs met. Son & MD in room.    Pre-mobility: 77 HR, 92% SpO2 (RA) Post-mobility: 82 HR, 93% SPO2 (RA)  Chief [employer] Officer

## 2023-04-11 NOTE — Progress Notes (Signed)
Mobility Specialist - Progress Note   04/11/23 1503  Mobility  Activity Ambulated independently in hallway;Ambulated independently to bathroom  Level of Assistance Independent  Assistive Device None  Distance Ambulated (ft) 350 ft  Activity Response Tolerated well  Mobility Referral Yes  $Mobility charge 1 Mobility   Pt received in bed declining mobility but requesting assistance to [facility]. When returning to bed pt opted out to ambulate in hallway. No complaints during session. Pt to EOB awaiting bath, call bell in reach. NT & family in room.     [facility]

## 2023-04-11 NOTE — Progress Notes (Signed)
PROGRESS NOTE  [name] ZHY:[id number] DOB: 1946-04-09 DOA: 04/06/2023 PCP: Myrlene Broker, MD   LOS: 5 days   Brief Narrative / Interim history: 77 year old female with history of neck cancer status post laryngectomy now with chronic trach, CKD 5, chronic diastolic CHF, COPD, history of CVA, prior DVT, hypothyroidism, PAF, CAD who comes into the hospital with shortness of breath and generalized weakness.  Subjective / 24h Interval events: She still has nausea this morning.  Breathing is good  Assesement and Plan: Principal Problem:   CHF exacerbation (HCC) Active Problems:   Hypertension   COPD (chronic obstructive pulmonary disease) (HCC)   Chronic pain syndrome   CKD (chronic kidney disease) stage 5, GFR less than 15 ml/min (HCC)   PAD (peripheral artery disease) (HCC)   Hypothyroidism   History of head and neck cancer-s/p laryngectomy-with tracheostomy   PAF (paroxysmal atrial fibrillation)-CHADS2 Vas score of 5   History of DVT (deep vein thrombosis)   Anemia in CKD (chronic kidney disease)   Alaryngeal voice   History of CVA (cerebrovascular accident)   Chronic diastolic (congestive) heart failure (HCC)   Principal problem Acute hypoxic respiratory failure due to acute on chronic diastolic CHF -chest x-ray on admission showed pulmonary edema and cardiomegaly.  2D echocardiogram showed normal EF at 60-65%, no WMA, grade 2 diastolic dysfunction,.  RV was normal, there was moderately elevated PA pressure at 52.6.  -Hypoxia now resolved, and she is on room air.  Repeat chest x-ray tomorrow morning  Active problems Anemia of chronic kidney disease -hemoglobin overall trending down, no obvious bleeding.  She is complaining of worsening back pain, CT scan of the abdomen ruled out retroperitoneal bleed.  Received 1 unit of packed blood cells with improvement in her hemoglobin and now has remained stable  History of neck cancer, laryngectomy -appears at baseline,  however she is requiring oxygen here for reasons detailed above  AKI on CKD 5 -baseline creatinine in the 4 range, as high as 4.7 last November.  Slightly worse today to 5.1, likely in the setting of IV Lasix.  She does not wish to be on dialysis.  Nephrology consulted, signed off.  For now I would reverse her to her home regimen of 80 mg Lasix twice daily, closely watch urine output and renal function.  This will give the family a better idea of her prognosis  PAD -continue cilostazol  Essential hypertension -continue home regimen as below  GAD, MDD -continue home lorazepam, mirtazapine, duloxetine  Hypothyroidism -continue home Synthroid  Renal lesions -neoplasm could not be excluded, may need to have additional workup as an outpatient  Scheduled Meds:  apixaban  2.5 mg Oral BID   aspirin EC  81 mg Oral Daily   carvedilol  25 mg Oral BID WC   cilostazol  100 mg Oral BID   cloNIDine  0.1 mg Transdermal Weekly   DULoxetine  30 mg Oral Daily   furosemide  80 mg Oral BID   hydrALAZINE  100 mg Oral Q8H   And   hydrALAZINE  25 mg Oral Q8H   isosorbide mononitrate  60 mg Oral Daily   levothyroxine  50 mcg Oral QAC breakfast   minoxidil  2.5 mg Oral BID   mirtazapine  15 mg Oral QHS   mouth rinse  15 mL Mouth Rinse 4 times per day   pantoprazole  40 mg Oral Daily   rosuvastatin  20 mg Oral Daily   sodium bicarbonate  650 mg Oral  TID   sodium chloride flush  3 mL Intravenous Q12H   Continuous Infusions:  sodium chloride 250 mL (04/07/23 0956)   ferric gluconate (FERRLECIT) IVPB 125 mg (04/11/23 1339)   PRN Meds:.sodium chloride, ipratropium-albuterol, labetalol, LORazepam, ondansetron (ZOFRAN) IV, mouth rinse, sodium chloride flush, sodium chloride HYPERTONIC  Current Outpatient Medications  Medication Instructions   acetaminophen-codeine (TYLENOL #3) 300-30 MG tablet 1 tablet, Oral, Every 6 hours PRN   aspirin EC 81 mg, Oral, Daily, Swallow whole.   Camphor-Menthol-Methyl Sal  (SALONPAS EX) 1 patch, Apply externally, As needed   carvedilol (COREG) 25 MG tablet TAKE 1 TABLET (25 MG TOTAL) BY MOUTH TWICE A DAY WITH MEALS   cilostazol (PLETAL) 100 MG tablet TAKE 1 TABLET BY MOUTH TWICE A DAY   cloNIDine (CATAPRES - DOSED IN MG/24 HR) 0.1 mg, Transdermal, Weekly   cyclobenzaprine (FLEXERIL) 5 MG tablet TAKE 0.5 TABLETS (2.5 MG TOTAL) BY MOUTH AT BEDTIME AS NEEDED FOR MUSCLE SPASMS.   diphenoxylate-atropine (LOMOTIL) 2.5-0.025 MG tablet 1 tablet, Oral, Daily PRN   DULoxetine (CYMBALTA) 20 MG capsule Oral, Daily   DULoxetine (CYMBALTA) 30 mg, Oral, Daily   Eliquis 2.5 mg, Oral, 2 times daily   furosemide (LASIX) 80 mg, Oral, 2 times daily, For increase edema   guaiFENesin (MUCINEX) 1,200 mg, Oral, 2 times daily   hydrALAZINE (APRESOLINE) 100 MG tablet Take 1 pill 3 times a day (total dose 125 mg with 25 mg tablet)   hydrALAZINE (APRESOLINE) 25 MG tablet Give 1 pill TID (1 of 100 mg and 1 of 25 mg tablets 3 times a day for total dose 125 mg TID) Hold if SBP less than 120   ipratropium-albuterol (DUONEB) 0.5-2.5 (3) MG/3ML SOLN 3 mLs, Nebulization, 2 times daily   isosorbide mononitrate (IMDUR) 60 mg, Oral, Daily   levothyroxine (SYNTHROID) 50 mcg, Oral, Daily before breakfast, Additional refill from pcp   LORazepam (ATIVAN) 1 mg, Oral, 2 times daily PRN   minoxidil (LONITEN) 2.5 mg, Oral, 2 times daily   mirtazapine (REMERON) 15 MG tablet TAKE 1 TABLET BY MOUTH EVERYDAY AT BEDTIME   nitroGLYCERIN (NITROSTAT) 0.4 mg, Sublingual, Every 5 min x3 PRN   omeprazole (PRILOSEC) 40 mg, Oral, Daily   ondansetron (ZOFRAN) 4 MG tablet TAKE 1 TABLET BY MOUTH EVERY 8 HOURS AS NEEDED FOR NAUSEA AND VOMITING   ondansetron (ZOFRAN) 40 MG/20ML SOLN injection Inject 2 mL intramuscularly every 6 hours as needed for nausea or vomiting   PE-diphenhydrAMINE-DM-GG-APAP (DELSYM DAY NIGHT PO) 20 mLs, Oral, 2 times daily PRN   rosuvastatin (CRESTOR) 20 mg, Oral, Daily   sodium bicarbonate 650 mg,  Oral, 3 times daily   SYRINGE-NEEDLE, DISP, 3 ML (B-D INTEGRA SYRINGE) 23G X 1" 3 ML MISC Use with zofran injections    Diet Orders (From admission, onward)     Start     Ordered   04/06/23 1228  Diet renal with fluid restriction Fluid restriction: 1800 mL Fluid; Room service appropriate? Yes; Fluid consistency: Thin  Diet effective now       Question Answer Comment  Fluid restriction: 1800 mL Fluid   Room service appropriate? Yes   Fluid consistency: Thin      04/06/23 1227            DVT prophylaxis: apixaban (ELIQUIS) tablet 2.5 mg Start: 04/06/23 2200 apixaban (ELIQUIS) tablet 2.5 mg   Lab Results  Component Value Date   PLT 206 04/11/2023      Code Status: DNR  Family Communication:  daughter at bedside   Status is: Inpatient  Remains inpatient appropriate because: severity of illness  Level of care: Med-Surg  Consultants:  none  Objective: Vitals:   04/11/23 0605 04/11/23 0821 04/11/23 0950 04/11/23 1323  BP: (!) 152/54   (!) 146/97  Pulse: 76     Resp:      Temp:      TempSrc:      SpO2:  93% 94%   Weight:      Height:        Intake/Output Summary (Last 24 hours) at 04/11/2023 1404 Last data filed at 04/11/2023 1339 Gross per 24 hour  Intake 820 ml  Output 1500 ml  Net -680 ml    Wt Readings from Last 3 Encounters:  04/06/23 51.7 kg  04/06/23 51.7 kg  03/12/23 51.7 kg    Examination:  Constitutional: NAD Eyes: lids and conjunctivae normal, no scleral icterus ENMT: mmm Neck: normal, supple Respiratory: clear to auscultation bilaterally, no wheezing, no crackles. Normal respiratory effort.  Cardiovascular: Regular rate and rhythm, no murmurs / rubs / gallops. No LE edema. Abdomen: soft, no distention, no tenderness. Bowel sounds positive.   Data Reviewed: I have independently reviewed following labs and imaging studies   CBC Recent Labs  Lab 04/06/23 0946 04/07/23 0421 04/08/23 0635 04/09/23 0408 04/10/23 0440 04/11/23 0409   WBC 7.0 7.5 8.0 7.2  --  6.9  HGB 6.7* 6.0* 7.6* 7.2* 7.3* 7.9*  HCT 21.5* 19.3* 23.6* 23.0* 23.0* 24.3*  PLT 187 189 197 186  --  206  MCV 88.8 88.9 84.9 85.5  --  84.1  MCH 27.7 27.6 27.3 26.8  --  27.3  MCHC 31.2 31.1 32.2 31.3  --  32.5  RDW 15.5 15.5 15.9* 15.9*  --  15.3  LYMPHSABS 0.8  --   --   --   --   --   MONOABS 0.5  --   --   --   --   --   EOSABS 0.1  --   --   --   --   --   BASOSABS 0.0  --   --   --   --   --      Recent Labs  Lab 04/06/23 0946 04/06/23 1203 04/07/23 0421 04/08/23 0635 04/09/23 0408 04/10/23 0440 04/11/23 0409  NA 140  --  141 138 141 139 138  K 4.5  --  3.8 3.8 3.6 3.7 3.4*  CL 104  --  104 100 102 101 100  CO2 23  --  22 25 27 24  21*  GLUCOSE 122*  --  111* 108* 103* 100* 104*  BUN 83*  --  81* 79* 76* 78* 81*  CREATININE 4.60*  --  4.68* 4.69* 4.90* 4.98* 5.19*  CALCIUM 8.6*  --  8.8* 8.4* 8.3* 8.4* 8.5*  AST 18  --   --  14*  --   --   --   ALT 12  --   --  10  --   --   --   ALKPHOS 69  --   --  65  --   --   --   BILITOT 0.5  --   --  0.9  --   --   --   ALBUMIN 3.4*  --   --  3.0*  --  2.9* 3.2*  MG  --   --   --   --  2.1  --  2.0  LATICACIDVEN 0.9 0.6  --   --   --   --   --   BNP 850.0*  --   --   --   --   --   --      ------------------------------------------------------------------------------------------------------------------ No results for input(s): "CHOL", "HDL", "LDLCALC", "TRIG", "CHOLHDL", "LDLDIRECT" in the last 72 hours.  Lab Results  Component Value Date   HGBA1C 5.5 08/07/2022   ------------------------------------------------------------------------------------------------------------------ No results for input(s): "TSH", "T4TOTAL", "T3FREE", "THYROIDAB" in the last 72 hours.  Invalid input(s): "FREET3"  Cardiac Enzymes No results for input(s): "CKMB", "TROPONINI", "MYOGLOBIN" in the last 168 hours.  Invalid input(s):  "CK" ------------------------------------------------------------------------------------------------------------------    Component Value Date/Time   BNP 850.0 (H) 04/06/2023 0946    CBG: No results for input(s): "GLUCAP" in the last 168 hours.  Recent Results (from the past 240 hour(s))  SARS Coronavirus 2 by RT PCR (hospital order, performed in [facility] hospital lab) *cepheid single result test* Anterior Nasal Swab     Status: None   Collection Time: 04/06/23  9:46 AM   Specimen: Anterior Nasal Swab  Result Value Ref Range Status   SARS Coronavirus 2 by RT PCR NEGATIVE NEGATIVE Final    Comment: (NOTE) SARS-CoV-2 target nucleic acids are NOT DETECTED.  The SARS-CoV-2 RNA is generally detectable in upper and lower respiratory specimens during the acute phase of infection. The lowest concentration of SARS-CoV-2 viral copies this assay can detect is 250 copies / mL. A negative result does not preclude SARS-CoV-2 infection and should not be used as the sole basis for treatment or other patient management decisions.  A negative result may occur with improper specimen collection / handling, submission of specimen other than nasopharyngeal swab, presence of viral mutation(s) within the areas targeted by this assay, and inadequate number of viral copies (<250 copies / mL). A negative result must be combined with clinical observations, patient history, and epidemiological information.  Fact Sheet for Patients:   RoadLapTop.co.za  Fact Sheet for Healthcare Providers: http://kim-miller.com/  This test is not yet approved or  cleared by the [place] FDA and has been authorized for detection and/or diagnosis of SARS-CoV-2 by FDA under an Emergency Use Authorization (EUA).  This EUA will remain in effect (meaning this test can be used) for the duration of the COVID-19 declaration under Section 564(b)(1) of the Act, 21 U.S.C. section  360bbb-3(b)(1), unless the authorization is terminated or revoked sooner.  Performed at [facility], [address] [place], [place] [address]   Culture, blood (routine x 2)     Status: None   Collection Time: 04/06/23  9:46 AM   Specimen: BLOOD  Result Value Ref Range Status   Specimen Description   Final    BLOOD BLOOD RIGHT ARM Performed at [facility], [address] [place], [place] [address]    Special Requests   Final    BOTTLES DRAWN AEROBIC AND ANAEROBIC Blood Culture adequate volume Performed at [facility], [address], [place], [place] [address]    Culture   Final    NO GROWTH 5 DAYS Performed at [facility], [address] [place], [place] [address]    Report Status 04/11/2023 FINAL  Final  Culture, blood (routine x 2)     Status: None   Collection Time: 04/06/23 10:06 AM   Specimen: BLOOD  Result Value Ref Range Status   Specimen Description   Final    BLOOD BLOOD  RIGHT FOREARM Performed at [facility], [address] [place], [place] [address]    Special Requests   Final    BOTTLES DRAWN AEROBIC AND ANAEROBIC Blood Culture adequate volume Performed at [facility], [address], [place], [place] [address]    Culture   Final    NO GROWTH 5 DAYS Performed at [facility], [address], [place], [place] [address]    Report Status 04/11/2023 FINAL  Final     Radiology Studies: No results found.   Pamella Pert, MD, PhD [facility]  Between 7 am - 7 pm I am available, please contact me via Amion (for emergencies) or Securechat (non urgent messages)  Between 7 pm - 7 am I am not available, please contact night coverage MD/APP via Amion

## 2023-04-12 ENCOUNTER — Inpatient Hospital Stay (HOSPITAL_COMMUNITY): Payer: Medicare Other

## 2023-04-12 DIAGNOSIS — I5033 Acute on chronic diastolic (congestive) heart failure: Secondary | ICD-10-CM | POA: Diagnosis not present

## 2023-04-12 LAB — BASIC METABOLIC PANEL
Anion gap: 14 (ref 5–15)
BUN: 78 mg/dL — ABNORMAL HIGH (ref 8–23)
CO2: 24 mmol/L (ref 22–32)
Calcium: 8.8 mg/dL — ABNORMAL LOW (ref 8.9–10.3)
Chloride: 101 mmol/L (ref 98–111)
Creatinine, Ser: 5.19 mg/dL — ABNORMAL HIGH (ref 0.44–1.00)
GFR, Estimated: 8 mL/min — ABNORMAL LOW (ref >60)
Glucose, Bld: 106 mg/dL — ABNORMAL HIGH (ref 70–99)
Potassium: 3.6 mmol/L (ref 3.5–5.1)
Sodium: 139 mmol/L (ref 135–145)

## 2023-04-12 LAB — MAGNESIUM: Magnesium: 1.8 mg/dL (ref 1.7–2.4)

## 2023-04-12 MED ORDER — ACETAMINOPHEN 325 MG PO TABS
650.0000 mg | ORAL_TABLET | Freq: Four times a day (QID) | ORAL | Status: AC | PRN
  Administered 2023-04-12: 650 mg via ORAL
  Filled 2023-04-12: qty 2

## 2023-04-12 NOTE — Plan of Care (Signed)

## 2023-04-12 NOTE — Progress Notes (Signed)
Physical Therapy Treatment Patient Details Name: [name] MRN: [id number] DOB: 03/30/1946 Today's Date: 04/12/2023   History of Present Illness 77 year old female with history of neck cancer status post laryngectomy now with chronic trach, CKD 5, chronic diastolic CHF, COPD, history of CVA, prior DVT, hypothyroidism, PAF, CAD who comes into the hospital with shortness of breath and generalized weakness. Dx of CHF exacerbation.    PT Comments    Pt seen for PT tx with pt agreeable to tx. Initially used iPad Spanish interpreter Arline Asp [phone]) but midway through session pt reporting she doesn't need it & speaks [place]. Pt reports numbness in BLE - unrated, monitored during session. Pt is able to ambulate around unit without AD with changes in gait speed on command without LOB. Pt performs 10x STS without BUE support for strengthening & endurance training. At this time, pt no longer requires acute PT services. PT to complete current orders. Please f/u if new needs arise.    Recommendations for follow up therapy are one component of a multi-disciplinary discharge planning process, led by the attending physician.  Recommendations may be updated based on patient status, additional functional criteria and insurance authorization.  Follow Up Recommendations       Assistance Recommended at Discharge PRN  Patient can return home with the following Assistance with cooking/housework   Equipment Recommendations  None recommended by PT    Recommendations for Other Services       Precautions / Restrictions Precautions Precautions: None Precaution Comments: monitor O2, has a tracheostomy Restrictions Weight Bearing Restrictions: No     Mobility  Bed Mobility Overal bed mobility: Modified Independent             General bed mobility comments: supine>sit with HOB elevated but not necessary    Transfers Overall transfer level: Independent Equipment used: None Transfers: Sit  to/from [employer] transfer comment: STS from EOB without assistance    Ambulation/Gait Ambulation/Gait assistance: Independent Gait Distance (Feet):  (>150 ft) Assistive device: None Gait Pattern/deviations: Decreased stride length, Step-through pattern Gait velocity: slightly decreased     General Gait Details: Pt able to increase gait speed & stop on command without LOB.   Stairs             Wheelchair Mobility    Modified Rankin (Stroke Patients Only)       Balance     Sitting balance-Leahy Scale: Normal     Standing balance support: No upper extremity supported, During functional activity Standing balance-Leahy Scale: Good                              Cognition Arousal/Alertness: Awake/alert Behavior During Therapy: WFL for tasks assessed/performed Overall Cognitive Status: Within Functional Limits for tasks assessed                                          Exercises Other Exercises Other Exercises: Pt performed 10x STS from EOB without BUE support with task focusing on BLE strengthening & endurance training. Pt initially using momentum to assist, as pt fatigues with pt decreased ability to come to full upright standing.    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 PT Goal Formulation: With patient Time For Goal Achievement: 04/22/23 Potential to Achieve Goals: Good Progress towards PT goals: Goals  met/education completed, patient discharged from PT    Frequency    Min 1X/week      PT Plan      Co-evaluation              AM-PAC PT "6 Clicks" Mobility   Outcome Measure  Help needed turning from your back to your side while in a flat bed without using bedrails?: None Help needed moving from lying on your back to sitting on the side of a flat bed without using bedrails?: None Help needed moving to and from a bed to a chair (including a wheelchair)?: None Help needed standing up from a chair using your arms (e.g., wheelchair or bedside chair)?: None Help needed to walk in hospital room?: None Help needed climbing 3-5 steps with a railing? : None 6 Click Score: 24    End of Session   Activity Tolerance: Patient tolerated treatment well Patient left: in bed;with call bell/phone within reach   PT Visit Diagnosis: Difficulty in walking, not elsewhere classified (R26.2);Muscle weakness (generalized) (M62.81)     Time: 0950-1004 PT Time Calculation (min) (ACUTE ONLY): 14 min  Charges:  $Therapeutic Activity: 8-22 mins                     Aleda Grana, PT, DPT 04/12/23, 10:10 AM   Sandi Mariscal 04/12/2023, 10:08 AM

## 2023-04-12 NOTE — Progress Notes (Signed)
PROGRESS NOTE  [name] NWG:[id number] DOB: [birth year] DOA: 04/06/2023 PCP: Myrlene Broker, MD   LOS: 6 days   Brief Narrative / Interim history: 77 year old female with history of neck cancer status post laryngectomy now with chronic trach, CKD 5, chronic diastolic CHF, COPD, history of CVA, prior DVT, hypothyroidism, PAF, CAD who comes into the hospital with shortness of breath and generalized weakness.  Subjective / 24h Interval events: Persistent nausea.  Breathing is improved.  Assesement and Plan: Principal Problem:   CHF exacerbation (HCC) Active Problems:   Hypertension   COPD (chronic obstructive pulmonary disease) (HCC)   Chronic pain syndrome   CKD (chronic kidney disease) stage 5, GFR less than 15 ml/min (HCC)   PAD (peripheral artery disease) (HCC)   Hypothyroidism   History of head and neck cancer-s/p laryngectomy-with tracheostomy   PAF (paroxysmal atrial fibrillation)-CHADS2 Vas score of 5   History of DVT (deep vein thrombosis)   Anemia in CKD (chronic kidney disease)   Alaryngeal voice   History of CVA (cerebrovascular accident)   Chronic diastolic (congestive) heart failure (HCC)   Principal problem Acute hypoxic respiratory failure due to acute on chronic diastolic CHF -chest x-ray on admission showed pulmonary edema and cardiomegaly.  2D echocardiogram showed normal EF at 60-65%, no WMA, grade 2 diastolic dysfunction,.  RV was normal, there was moderately elevated PA pressure at 52.6.  -Hypoxia now resolved, and she is on room air.  Repeat chest x-ray today pending  Active problems Anemia of chronic kidney disease -hemoglobin overall trending down, no obvious bleeding.  She is complaining of worsening back pain, CT scan of the abdomen ruled out retroperitoneal bleed.  Received 1 unit of packed blood cells with improvement in her hemoglobin and now has remained stable.  Recheck tomorrow morning  History of neck cancer, laryngectomy -appears at  baseline  AKI on CKD 5 -baseline creatinine in the 4 range, as high as 4.7 last November.  Slightly worse today to 5.1, likely in the setting of IV Lasix.  She does not wish to be on dialysis.  Nephrology consulted, signed off.  Was placed back on her home 80 mg furosemide BID.  Renal function stable at 5.1.  Recheck in the morning  PAD -continue cilostazol  Essential hypertension -continue home regimen as below  GAD, MDD -continue home lorazepam, mirtazapine, duloxetine  Hypothyroidism -continue home Synthroid  Renal lesions -neoplasm could not be excluded, may need to have additional workup as an outpatient  Scheduled Meds:  apixaban  2.5 mg Oral BID   aspirin EC  81 mg Oral Daily   carvedilol  25 mg Oral BID WC   cilostazol  100 mg Oral BID   cloNIDine  0.1 mg Transdermal Weekly   DULoxetine  30 mg Oral Daily   furosemide  80 mg Oral BID   hydrALAZINE  100 mg Oral Q8H   And   hydrALAZINE  25 mg Oral Q8H   isosorbide mononitrate  60 mg Oral Daily   levothyroxine  50 mcg Oral QAC breakfast   minoxidil  2.5 mg Oral BID   mirtazapine  15 mg Oral QHS   mouth rinse  15 mL Mouth Rinse 4 times per day   pantoprazole  40 mg Oral Daily   rosuvastatin  20 mg Oral Daily   sodium bicarbonate  650 mg Oral TID   sodium chloride flush  3 mL Intravenous Q12H   Continuous Infusions:  sodium chloride 250 mL (04/07/23 0956)  ferric gluconate (FERRLECIT) IVPB 125 mg (04/12/23 0958)   PRN Meds:.sodium chloride, ipratropium-albuterol, labetalol, LORazepam, ondansetron (ZOFRAN) IV, mouth rinse, sodium chloride flush, sodium chloride HYPERTONIC  Current Outpatient Medications  Medication Instructions   acetaminophen-codeine (TYLENOL #3) 300-30 MG tablet 1 tablet, Oral, Every 6 hours PRN   aspirin EC 81 mg, Oral, Daily, Swallow whole.   Camphor-Menthol-Methyl Sal (SALONPAS EX) 1 patch, Apply externally, As needed   carvedilol (COREG) 25 MG tablet TAKE 1 TABLET (25 MG TOTAL) BY MOUTH TWICE A  DAY WITH MEALS   cilostazol (PLETAL) 100 MG tablet TAKE 1 TABLET BY MOUTH TWICE A DAY   cloNIDine (CATAPRES - DOSED IN MG/24 HR) 0.1 mg, Transdermal, Weekly   cyclobenzaprine (FLEXERIL) 5 MG tablet TAKE 0.5 TABLETS (2.5 MG TOTAL) BY MOUTH AT BEDTIME AS NEEDED FOR MUSCLE SPASMS.   diphenoxylate-atropine (LOMOTIL) 2.5-0.025 MG tablet 1 tablet, Oral, Daily PRN   DULoxetine (CYMBALTA) 20 MG capsule Oral, Daily   DULoxetine (CYMBALTA) 30 mg, Oral, Daily   Eliquis 2.5 mg, Oral, 2 times daily   furosemide (LASIX) 80 mg, Oral, 2 times daily, For increase edema   guaiFENesin (MUCINEX) 1,200 mg, Oral, 2 times daily   hydrALAZINE (APRESOLINE) 100 MG tablet Take 1 pill 3 times a day (total dose 125 mg with 25 mg tablet)   hydrALAZINE (APRESOLINE) 25 MG tablet Give 1 pill TID (1 of 100 mg and 1 of 25 mg tablets 3 times a day for total dose 125 mg TID) Hold if SBP less than 120   ipratropium-albuterol (DUONEB) 0.5-2.5 (3) MG/3ML SOLN 3 mLs, Nebulization, 2 times daily   isosorbide mononitrate (IMDUR) 60 mg, Oral, Daily   levothyroxine (SYNTHROID) 50 mcg, Oral, Daily before breakfast, Additional refill from pcp   LORazepam (ATIVAN) 1 mg, Oral, 2 times daily PRN   minoxidil (LONITEN) 2.5 mg, Oral, 2 times daily   mirtazapine (REMERON) 15 MG tablet TAKE 1 TABLET BY MOUTH EVERYDAY AT BEDTIME   nitroGLYCERIN (NITROSTAT) 0.4 mg, Sublingual, Every 5 min x3 PRN   omeprazole (PRILOSEC) 40 mg, Oral, Daily   ondansetron (ZOFRAN) 4 MG tablet TAKE 1 TABLET BY MOUTH EVERY 8 HOURS AS NEEDED FOR NAUSEA AND VOMITING   ondansetron (ZOFRAN) 40 MG/20ML SOLN injection Inject 2 mL intramuscularly every 6 hours as needed for nausea or vomiting   PE-diphenhydrAMINE-DM-GG-APAP (DELSYM DAY NIGHT PO) 20 mLs, Oral, 2 times daily PRN   rosuvastatin (CRESTOR) 20 mg, Oral, Daily   sodium bicarbonate 650 mg, Oral, 3 times daily   SYRINGE-NEEDLE, DISP, 3 ML (B-D INTEGRA SYRINGE) 23G X 1" 3 ML MISC Use with zofran injections    Diet  Orders (From admission, onward)     Start     Ordered   04/06/23 1228  Diet renal with fluid restriction Fluid restriction: 1800 mL Fluid; Room service appropriate? Yes; Fluid consistency: Thin  Diet effective now       Question Answer Comment  Fluid restriction: 1800 mL Fluid   Room service appropriate? Yes   Fluid consistency: Thin      04/06/23 1227            DVT prophylaxis: apixaban (ELIQUIS) tablet 2.5 mg Start: 04/06/23 2200 apixaban (ELIQUIS) tablet 2.5 mg   Lab Results  Component Value Date   PLT 206 04/11/2023      Code Status: DNR  Family Communication: daughter at bedside   Status is: Inpatient  Remains inpatient appropriate because: severity of illness  Level of care: Med-Surg  Consultants:  none  Objective: Vitals:   04/12/23 0459 04/12/23 0624 04/12/23 0748 04/12/23 0926  BP: (!) 138/52 (!) 157/57  (!) 115/93  Pulse: 76  72 80  Resp: 17  16 20   Temp: 98.8 F (37.1 C)   99.1 F (37.3 C)  TempSrc: Oral   Oral  SpO2: 97%  92% 95%  Weight:      Height:        Intake/Output Summary (Last 24 hours) at 04/12/2023 1205 Last data filed at 04/12/2023 0910 Gross per 24 hour  Intake 694.26 ml  Output 600 ml  Net 94.26 ml    Wt Readings from Last 3 Encounters:  04/06/23 51.7 kg  04/06/23 51.7 kg  03/12/23 51.7 kg    Examination:  Constitutional: NAD Eyes: lids and conjunctivae normal, no scleral icterus ENMT: mmm Neck: normal, supple Respiratory: clear to auscultation bilaterally, no wheezing, no crackles.  Cardiovascular: Regular rate and rhythm, no murmurs / rubs / gallops. No LE edema. Abdomen: soft, no distention, no tenderness. Bowel sounds positive.   Data Reviewed: I have independently reviewed following labs and imaging studies   CBC Recent Labs  Lab 04/06/23 0946 04/07/23 0421 04/08/23 0635 04/09/23 0408 04/10/23 0440 04/11/23 0409  WBC 7.0 7.5 8.0 7.2  --  6.9  HGB 6.7* 6.0* 7.6* 7.2* 7.3* 7.9*  HCT 21.5* 19.3* 23.6*  23.0* 23.0* 24.3*  PLT 187 189 197 186  --  206  MCV 88.8 88.9 84.9 85.5  --  84.1  MCH 27.7 27.6 27.3 26.8  --  27.3  MCHC 31.2 31.1 32.2 31.3  --  32.5  RDW 15.5 15.5 15.9* 15.9*  --  15.3  LYMPHSABS 0.8  --   --   --   --   --   MONOABS 0.5  --   --   --   --   --   EOSABS 0.1  --   --   --   --   --   BASOSABS 0.0  --   --   --   --   --      Recent Labs  Lab 04/06/23 0946 04/06/23 1203 04/07/23 0421 04/08/23 0635 04/09/23 0408 04/10/23 0440 04/11/23 0409 04/12/23 0414  NA 140  --    < > 138 141 139 138 139  K 4.5  --    < > 3.8 3.6 3.7 3.4* 3.6  CL 104  --    < > 100 102 101 100 101  CO2 23  --    < > 25 27 24  21* 24  GLUCOSE 122*  --    < > 108* 103* 100* 104* 106*  BUN 83*  --    < > 79* 76* 78* 81* 78*  CREATININE 4.60*  --    < > 4.69* 4.90* 4.98* 5.19* 5.19*  CALCIUM 8.6*  --    < > 8.4* 8.3* 8.4* 8.5* 8.8*  AST 18  --   --  14*  --   --   --   --   ALT 12  --   --  10  --   --   --   --   ALKPHOS 69  --   --  65  --   --   --   --   BILITOT 0.5  --   --  0.9  --   --   --   --   ALBUMIN 3.4*  --   --  3.0*  --  2.9* 3.2*  --   MG  --   --   --   --  2.1  --  2.0 1.8  LATICACIDVEN 0.9 0.6  --   --   --   --   --   --   BNP 850.0*  --   --   --   --   --   --   --    < > = values in this interval not displayed.     ------------------------------------------------------------------------------------------------------------------ No results for input(s): "CHOL", "HDL", "LDLCALC", "TRIG", "CHOLHDL", "LDLDIRECT" in the last 72 hours.  Lab Results  Component Value Date   HGBA1C 5.5 08/07/2022   ------------------------------------------------------------------------------------------------------------------ No results for input(s): "TSH", "T4TOTAL", "T3FREE", "THYROIDAB" in the last 72 hours.  Invalid input(s): "FREET3"  Cardiac Enzymes No results for input(s): "CKMB", "TROPONINI", "MYOGLOBIN" in the last 168 hours.  Invalid input(s):  "CK" ------------------------------------------------------------------------------------------------------------------    Component Value Date/Time   BNP 850.0 (H) 04/06/2023 0946    CBG: No results for input(s): "GLUCAP" in the last 168 hours.  Recent Results (from the past 240 hour(s))  SARS Coronavirus 2 by RT PCR (hospital order, performed in [facility] hospital lab) *cepheid single result test* Anterior Nasal Swab     Status: None   Collection Time: 04/06/23  9:46 AM   Specimen: Anterior Nasal Swab  Result Value Ref Range Status   SARS Coronavirus 2 by RT PCR NEGATIVE NEGATIVE Final    Comment: (NOTE) SARS-CoV-2 target nucleic acids are NOT DETECTED.  The SARS-CoV-2 RNA is generally detectable in upper and lower respiratory specimens during the acute phase of infection. The lowest concentration of SARS-CoV-2 viral copies this assay can detect is 250 copies / mL. A negative result does not preclude SARS-CoV-2 infection and should not be used as the sole basis for treatment or other patient management decisions.  A negative result may occur with improper specimen collection / handling, submission of specimen other than nasopharyngeal swab, presence of viral mutation(s) within the areas targeted by this assay, and inadequate number of viral copies (<250 copies / mL). A negative result must be combined with clinical observations, patient history, and epidemiological information.  Fact Sheet for Patients:   RoadLapTop.co.za  Fact Sheet for Healthcare Providers: http://kim-miller.com/  This test is not yet approved or  cleared by the [place] FDA and has been authorized for detection and/or diagnosis of SARS-CoV-2 by FDA under an Emergency Use Authorization (EUA).  This EUA will remain in effect (meaning this test can be used) for the duration of the COVID-19 declaration under Section 564(b)(1) of the Act, 21 U.S.C. section  360bbb-3(b)(1), unless the authorization is terminated or revoked sooner.  Performed at [facility], [address] [place], [place] [address]   Culture, blood (routine x 2)     Status: None   Collection Time: 04/06/23  9:46 AM   Specimen: BLOOD  Result Value Ref Range Status   Specimen Description   Final    BLOOD BLOOD RIGHT ARM Performed at [facility], [address], [place], [place] [address]    Special Requests   Final    BOTTLES DRAWN AEROBIC AND ANAEROBIC Blood Culture adequate volume Performed at [facility], [address], [place], [place] [address]    Culture   Final    NO GROWTH 5 DAYS Performed at [facility], [address], [place], [place] [address]  Report Status 04/11/2023 FINAL  Final  Culture, blood (routine x 2)     Status: None   Collection Time: 04/06/23 10:06 AM   Specimen: BLOOD  Result Value Ref Range Status   Specimen Description   Final    BLOOD BLOOD RIGHT FOREARM Performed at [facility], [address], [place], [place] [address]    Special Requests   Final    BOTTLES DRAWN AEROBIC AND ANAEROBIC Blood Culture adequate volume Performed at [facility], [address], [place], [place] [address]    Culture   Final    NO GROWTH 5 DAYS Performed at [facility], [address], [place], [place] [address]    Report Status 04/11/2023 FINAL  Final     Radiology Studies: No results found.   Pamella Pert, MD, PhD [facility]  Between 7 am - 7 pm I am available, please contact me via Amion (for emergencies) or Securechat (non urgent messages)  Between 7 pm - 7 am I am not available, please contact night coverage MD/APP via Amion

## 2023-04-13 DIAGNOSIS — I5033 Acute on chronic diastolic (congestive) heart failure: Secondary | ICD-10-CM | POA: Diagnosis not present

## 2023-04-13 LAB — CBC
HCT: 25.9 % — ABNORMAL LOW (ref 36.0–46.0)
Hemoglobin: 8.2 g/dL — ABNORMAL LOW (ref 12.0–15.0)
MCH: 27.4 pg (ref 26.0–34.0)
MCHC: 31.7 g/dL (ref 30.0–36.0)
MCV: 86.6 fL (ref 80.0–100.0)
Platelets: 204 10*3/uL (ref 150–400)
RBC: 2.99 MIL/uL — ABNORMAL LOW (ref 3.87–5.11)
RDW: 15.4 % (ref 11.5–15.5)
WBC: 7.1 10*3/uL (ref 4.0–10.5)
nRBC: 0 % (ref 0.0–0.2)

## 2023-04-13 LAB — BASIC METABOLIC PANEL
Anion gap: 14 (ref 5–15)
BUN: 79 mg/dL — ABNORMAL HIGH (ref 8–23)
CO2: 23 mmol/L (ref 22–32)
Calcium: 8.4 mg/dL — ABNORMAL LOW (ref 8.9–10.3)
Chloride: 102 mmol/L (ref 98–111)
Creatinine, Ser: 5.27 mg/dL — ABNORMAL HIGH (ref 0.44–1.00)
GFR, Estimated: 8 mL/min — ABNORMAL LOW (ref >60)
Glucose, Bld: 107 mg/dL — ABNORMAL HIGH (ref 70–99)
Potassium: 3.7 mmol/L (ref 3.5–5.1)
Sodium: 139 mmol/L (ref 135–145)

## 2023-04-13 LAB — MAGNESIUM: Magnesium: 1.8 mg/dL (ref 1.7–2.4)

## 2023-04-13 MED ORDER — LOPERAMIDE HCL 2 MG PO CAPS: 2.0000 mg | ORAL_CAPSULE | ORAL | Status: DC | PRN

## 2023-04-13 MED ORDER — SIMETHICONE 40 MG/0.6ML PO SUSP: 40.0000 mg | Freq: Four times a day (QID) | ORAL | Status: DC | PRN

## 2023-04-13 NOTE — Discharge Summary (Signed)
Physician Discharge Summary  [name]:[id number] DOB: May 28, 1946 DOA: 04/06/2023  PCP: Myrlene Broker, MD  Admit date: 04/06/2023 Discharge date: 04/13/2023  Admitted From: home Disposition:  home  Recommendations for Outpatient Follow-up:  Follow up with PCP in 1-2 weeks Follow-up with Dr. Thedore Mins with nephrology in 1 to 2 weeks  Home Health: none Equipment/Devices: none  Discharge Condition: stable CODE STATUS: Full code  HPI: Per admitting MD, [name] is a 77 y.o. female with medical history significant for neck cancer s/p laryngectomy now has ostomy, ckd5, dchf, copd, hx cva, hx dvt, htn, hypothyroid, paroxysmal a fib, pad, cad, who presents with the above. Several days of complaining of shortness of breath. Went to [employer] [facility] [employer] office today where o2 was noted to be low normal and referred to our ER. Denies cough, no fever, no increased ostomy secretions. No chest pain or dysuria. No dysphagia or abdominal pain. No recent medication changes. No significant lower extremity swelling.  Hospital Course / Discharge diagnoses: Principal Problem:   CHF exacerbation (HCC) Active Problems:   Hypertension   COPD (chronic obstructive pulmonary disease) (HCC)   Chronic pain syndrome   CKD (chronic kidney disease) stage 5, GFR less than 15 ml/min (HCC)   PAD (peripheral artery disease) (HCC)   Hypothyroidism   History of head and neck cancer-s/p laryngectomy-with tracheostomy   PAF (paroxysmal atrial fibrillation)-CHADS2 Vas score of 5   History of DVT (deep vein thrombosis)   Anemia in CKD (chronic kidney disease)   Alaryngeal voice   History of CVA (cerebrovascular accident)   Chronic diastolic (congestive) heart failure (HCC)   Principal problem Acute hypoxic respiratory failure due to acute on chronic diastolic CHF -chest x-ray on admission showed pulmonary edema and cardiomegaly.  2D echocardiogram showed normal EF at 60-65%, no WMA, grade 2 diastolic  dysfunction,.  RV was normal, there was moderately elevated PA pressure at 52.6.  Due to her advanced renal failure, nephrology consulted.  Patient was diuresed for several days with IV Lasix, she improved, she was weaned off to room air and now is back to baseline, I will ambulate in the hallway without difficulties.  Chest x-ray shows improvement as well.  She is to continue her home diuretics, and stressed the importance of fluid restriction   Active problems Anemia of chronic kidney disease -hemoglobin overall trending down, no obvious bleeding.  She is complaining of worsening back pain, CT scan of the abdomen ruled out retroperitoneal bleed.  Received 1 unit of packed blood cells with improvement in her hemoglobin and now has remained stable.    History of neck cancer, laryngectomy -appears at baseline AKI on CKD 5 -baseline creatinine in the 4 range, as high as 4.7 last November.  After IV diuresis increased to 5.2, overall stable, outpatient follow-up.  She does not want dialysis and was told she is not a candidate.  PAD -continue cilostazol Essential hypertension -continue home regimen as below GAD, MDD -continue home lorazepam, mirtazapine, duloxetine Hypothyroidism -continue home Synthroid Renal lesions -neoplasm could not be excluded, may need to have additional workup as an outpatient  Sepsis ruled out   Discharge Instructions   Allergies as of 04/13/2023       Reactions   Xyzal [levocetirizine Dihydrochloride] Itching   Augmentin [amoxicillin-pot Clavulanate] Diarrhea, Nausea And Vomiting   Tribenzor [olmesartan-amlodipine-hctz] Other (See Comments)   "Hurt the kidneys"        Medication List     TAKE these medications  acetaminophen-codeine 300-30 MG tablet Commonly known as: TYLENOL #3 TAKE 1 TABLET BY MOUTH EVERY 6 HOURS AS NEEDED What changed: when to take this   aspirin EC 81 MG tablet Take 1 tablet (81 mg total) by mouth daily. Swallow whole.   B-D  INTEGRA SYRINGE 23G X 1" 3 ML Misc Generic drug: SYRINGE-NEEDLE (DISP) 3 ML Use with zofran injections   carvedilol 25 MG tablet Commonly known as: COREG TAKE 1 TABLET (25 MG TOTAL) BY MOUTH TWICE A DAY WITH MEALS What changed: See the new instructions.   cilostazol 100 MG tablet Commonly known as: PLETAL TAKE 1 TABLET BY MOUTH TWICE A DAY   cloNIDine 0.1 mg/24hr patch Commonly known as: CATAPRES - Dosed in mg/24 hr Place 1 patch (0.1 mg total) onto the skin once a week.   cyclobenzaprine 5 MG tablet Commonly known as: FLEXERIL TAKE 0.5 TABLETS (2.5 MG TOTAL) BY MOUTH AT BEDTIME AS NEEDED FOR MUSCLE SPASMS. What changed: See the new instructions.   DELSYM DAY NIGHT PO Take 20 mLs by mouth 2 (two) times daily as needed (cough).   diphenoxylate-atropine 2.5-0.025 MG tablet Commonly known as: Lomotil Take 1 tablet by mouth daily as needed for diarrhea or loose stools.   DULoxetine 30 MG capsule Commonly known as: CYMBALTA Take 1 capsule (30 mg total) by mouth daily. What changed: Another medication with the same name was removed. Continue taking this medication, and follow the directions you see here.   Eliquis 2.5 MG Tabs tablet Generic drug: apixaban TAKE 1 TABLET BY MOUTH TWICE A DAY   furosemide 40 MG tablet Commonly known as: Lasix Take 2 tablets (80 mg total) by mouth 2 (two) times daily. For increase edema What changed: how much to take   guaiFENesin 600 MG 12 hr tablet Commonly known as: MUCINEX Take 2 tablets (1,200 mg total) by mouth 2 (two) times daily.   hydrALAZINE 100 MG tablet Commonly known as: APRESOLINE Take 1 pill 3 times a day (total dose 125 mg with 25 mg tablet) What changed:  how much to take how to take this when to take this additional instructions   hydrALAZINE 25 MG tablet Commonly known as: APRESOLINE Give 1 pill TID (1 of 100 mg and 1 of 25 mg tablets 3 times a day for total dose 125 mg TID) Hold if SBP less than 120 What changed:   how much to take how to take this when to take this additional instructions   ipratropium-albuterol 0.5-2.5 (3) MG/3ML Soln Commonly known as: DUONEB Take 3 mLs by nebulization 2 (two) times daily.   isosorbide mononitrate 60 MG 24 hr tablet Commonly known as: IMDUR TAKE 1 TABLET BY MOUTH EVERY DAY   levothyroxine 50 MCG tablet Commonly known as: SYNTHROID Take 1 tablet (50 mcg total) by mouth daily before breakfast. Additional refill from pcp   LORazepam 1 MG tablet Commonly known as: ATIVAN TAKE 1 TABLET BY MOUTH 2 TIMES DAILY AS NEEDED FOR ANXIETY. What changed: when to take this   minoxidil 2.5 MG tablet Commonly known as: LONITEN Take 1 tablet (2.5 mg total) by mouth 2 (two) times daily.   mirtazapine 15 MG tablet Commonly known as: REMERON TAKE 1 TABLET BY MOUTH EVERYDAY AT BEDTIME What changed: See the new instructions.   nitroGLYCERIN 0.4 MG SL tablet Commonly known as: NITROSTAT Place 1 tablet (0.4 mg total) under the tongue every 5 (five) minutes x 3 doses as needed for chest pain.   omeprazole 40 MG  capsule Commonly known as: PRILOSEC Take 1 capsule (40 mg total) by mouth daily.   ondansetron 4 MG tablet Commonly known as: ZOFRAN TAKE 1 TABLET BY MOUTH EVERY 8 HOURS AS NEEDED FOR NAUSEA AND VOMITING What changed:  See the new instructions. Another medication with the same name was removed. Continue taking this medication, and follow the directions you see here.   rosuvastatin 20 MG tablet Commonly known as: CRESTOR TAKE 1 TABLET BY MOUTH EVERY DAY   SALONPAS EX Apply 1 patch topically as needed (pain).   sodium bicarbonate 650 MG tablet Take 650 mg by mouth 3 (three) times daily.         Consultations: Nephrology   Procedures/Studies:  DG Chest 2 View  Result Date: 04/12/2023 CLINICAL DATA:  Hypoxemia. EXAM: CHEST - 2 VIEW COMPARISON:  04/09/2023. FINDINGS: Near-complete resolution of the previously seen small right pleural effusion  with improved pulmonary edema. Stable cardiac and mediastinal contours. No pneumothorax. IMPRESSION: Near-complete resolution of the previously seen small right pleural effusion with improved pulmonary edema. Electronically Signed   By: Orvan Falconer M.D.   On: 04/12/2023 14:35   DG Chest 2 View  Result Date: 04/09/2023 CLINICAL DATA:  Pleural effusion EXAM: CHEST - 2 VIEW COMPARISON:  04/06/2023 FINDINGS: Enlargement of cardiac silhouette. Atherosclerotic calcification aorta. Diffuse interstitial infiltrates likely pulmonary edema, little changed. Small bibasilar pleural effusions greater on RIGHT. No pneumothorax. Bones demineralized. IMPRESSION: CHF with small bibasilar effusions. Aortic Atherosclerosis (ICD10-I70.0). Electronically Signed   By: Ulyses Southward M.D.   On: 04/09/2023 11:04   ECHOCARDIOGRAM COMPLETE  Result Date: 04/08/2023    ECHOCARDIOGRAM REPORT   Patient Name:   JAIMY KLIETHERMES Date of Exam: 04/08/2023 Medical Rec #:  [id number]        Height:       62.0 in Accession #:    [id number]       Weight:       114.0 lb Date of Birth:  05-18-1946       BSA:          1.505 m Patient Age:    76 years         BP:           177/74 mmHg Patient Gender: F                HR:           50 bpm. Exam Location:  Inpatient Procedure: 2D Echo, Cardiac Doppler and Color Doppler Indications:    Pericardial Effusion  History:        Patient has prior history of Echocardiogram examinations, most                 recent 10/29/2022. CHF, COPD, PAD and Stroke, Arrythmias:Atrial                 Fibrillation; Risk Factors:Hypertension.  Sonographer:    Wallie Char Referring Phys: [id number] Daylene Katayama Langley Flatley IMPRESSIONS  1. Left ventricular ejection fraction, by estimation, is 60 to 65%. The left ventricle has normal function. The left ventricle has no regional wall motion abnormalities. There is mild concentric left ventricular hypertrophy. Left ventricular diastolic parameters are consistent with Grade II diastolic  dysfunction (pseudonormalization). Elevated left ventricular end-diastolic pressure.  2. Right ventricular systolic function is normal. The right ventricular size is normal. There is moderately elevated pulmonary artery systolic pressure. The estimated right ventricular systolic pressure is 52.6 mmHg.  3. Left atrial size was severely dilated.  4. Right atrial size was severely dilated.  5. The mitral valve is degenerative. Trivial mitral valve regurgitation. No evidence of mitral stenosis.  6. The aortic valve is normal in structure. Aortic valve regurgitation is not visualized. No aortic stenosis is present. Aortic valve mean gradient measures 6.5 mmHg. Aortic valve Vmax measures 1.82 m/s. The AVA is underestimated due to small LVOT measurement.  7. The inferior vena cava is dilated in size with >50% respiratory variability, suggesting right atrial pressure of 8 mmHg.  8. A small pericardial effusion is present. The pericardial effusion is circumferential. Large pleural effusion in the left lateral region. FINDINGS  Left Ventricle: Left ventricular ejection fraction, by estimation, is 60 to 65%. The left ventricle has normal function. The left ventricle has no regional wall motion abnormalities. The left ventricular internal cavity size was normal in size. There is  mild concentric left ventricular hypertrophy. Left ventricular diastolic parameters are consistent with Grade II diastolic dysfunction (pseudonormalization). Elevated left ventricular end-diastolic pressure. Right Ventricle: The right ventricular size is normal. No increase in right ventricular wall thickness. Right ventricular systolic function is normal. There is moderately elevated pulmonary artery systolic pressure. The tricuspid regurgitant velocity is 3.34 m/s, and with an assumed right atrial pressure of 8 mmHg, the estimated right ventricular systolic pressure is 52.6 mmHg. Left Atrium: Left atrial size was severely dilated. Right Atrium: Right  atrial size was severely dilated. Pericardium: A small pericardial effusion is present. The pericardial effusion is circumferential. Mitral Valve: The mitral valve is degenerative in appearance. There is mild calcification of the mitral valve leaflet(s). Trivial mitral valve regurgitation. No evidence of mitral valve stenosis. MV peak gradient, 8.5 mmHg. The mean mitral valve gradient  is 5.0 mmHg. Tricuspid Valve: The tricuspid valve is normal in structure. Tricuspid valve regurgitation is mild . No evidence of tricuspid stenosis. Aortic Valve: The aortic valve is normal in structure. Aortic valve regurgitation is not visualized. No aortic stenosis is present. Aortic valve mean gradient measures 6.5 mmHg. Aortic valve peak gradient measures 13.2 mmHg. Aortic valve area, by VTI measures 1.36 cm. Pulmonic Valve: The pulmonic valve was normal in structure. Pulmonic valve regurgitation is mild. No evidence of pulmonic stenosis. Aorta: The aortic root is normal in size and structure. Venous: The inferior vena cava is dilated in size with greater than 50% respiratory variability, suggesting right atrial pressure of 8 mmHg. IAS/Shunts: No atrial level shunt detected by color flow Doppler. Additional Comments: There is a large pleural effusion in the left lateral region.  LEFT VENTRICLE PLAX 2D LVIDd:         4.70 cm     Diastology LVIDs:         3.10 cm     LV e' medial:    5.72 cm/s LV PW:         1.10 cm     LV E/e' medial:  21.9 LV IVS:        1.10 cm     LV e' lateral:   7.66 cm/s LVOT diam:     1.50 cm     LV E/e' lateral: 16.3 LV SV:         52 LV SV Index:   35 LVOT Area:     1.77 cm  LV Volumes (MOD) LV vol d, MOD A2C: 82.9 ml LV vol d, MOD A4C: 91.2 ml LV vol s, MOD A2C: 29.1 ml LV vol s, MOD A4C: 33.4 ml LV SV MOD A2C:  53.8 ml LV SV MOD A4C:     91.2 ml LV SV MOD BP:      55.6 ml RIGHT VENTRICLE             IVC RV Basal diam:  3.60 cm     IVC diam: 2.50 cm RV S prime:     14.70 cm/s TAPSE (M-mode): 2.2 cm  LEFT ATRIUM             Index        RIGHT ATRIUM           Index LA diam:        4.30 cm 2.86 cm/m   RA Area:     19.40 cm LA Vol (A2C):   69.7 ml 46.30 ml/m  RA Volume:   57.30 ml  38.07 ml/m LA Vol (A4C):   70.7 ml 46.97 ml/m LA Biplane Vol: 71.0 ml 47.17 ml/m  AORTIC VALVE AV Area (Vmax):    1.27 cm AV Area (Vmean):   1.35 cm AV Area (VTI):     1.36 cm AV Vmax:           182.00 cm/s AV Vmean:          120.000 cm/s AV VTI:            0.382 m AV Peak Grad:      13.2 mmHg AV Mean Grad:      6.5 mmHg LVOT Vmax:         130.50 cm/s LVOT Vmean:        91.700 cm/s LVOT VTI:          0.294 m LVOT/AV VTI ratio: 0.77  AORTA Ao Root diam: 2.90 cm Ao Asc diam:  3.00 cm MITRAL VALVE                TRICUSPID VALVE MV Area (PHT): 3.91 cm     TR Peak grad:   44.6 mmHg MV Area VTI:   1.50 cm     TR Vmax:        334.00 cm/s MV Peak grad:  8.5 mmHg MV Mean grad:  5.0 mmHg     SHUNTS MV Vmax:       1.46 m/s     Systemic VTI:  0.29 m MV Vmean:      101.0 cm/s   Systemic Diam: 1.50 cm MV Decel Time: 194 msec MV E velocity: 125.00 cm/s MV A velocity: 135.00 cm/s MV E/A ratio:  0.93 Armanda Magic MD Electronically signed by Armanda Magic MD Signature Date/Time: 04/08/2023/11:58:06 AM    Final    CT ABDOMEN PELVIS WO CONTRAST  Result Date: 04/07/2023 CLINICAL DATA:  Pain.  Retroperitoneal bleed EXAM: CT ABDOMEN AND PELVIS WITHOUT CONTRAST TECHNIQUE: Multidetector CT imaging of the abdomen and pelvis was performed following the standard protocol without IV contrast. RADIATION DOSE REDUCTION: This exam was performed according to the departmental dose-optimization program which includes automated exposure control, adjustment of the mA and/or kV according to patient size and/or use of iterative reconstruction technique. COMPARISON:  Noncontrast CT 07/04/2021 FINDINGS: Lower chest: Moderate right and small left pleural effusion with adjacent lung opacities. Atelectasis versus infiltrate. Recommend follow-up. Breathing motion lung  bases. There is a enlarged heart with a moderate pericardial effusion. Coronary artery calcifications are seen. Hepatobiliary: On this non IV contrast exam, the liver is grossly preserved. Liver parenchyma is slightly dense, nonspecific. Please correlate for any history. Gallbladder is nondilated. Pancreas: Small cysts in the midbody  of the pancreas measures 7 mm. This has not seen in 2022. Spleen: Normal in size without focal abnormality. Adrenals/Urinary Tract: Right adrenal gland is preserved. There is some thickening of the left adrenal gland but low in density with Hounsfield units of less than 10, adenoma. Both kidneys are lobular with multiple complex cystic and dense lesions. Many of these were seen previously but these are larger today. A more aggressive lesion is difficult to exclude amongst the several complex lesions bilaterally. Largest is seen on the right measuring 5.0 x 3.6 cm and left 2.6 by 2.4 cm. Preserved contours of the urinary bladder. Stomach/Bowel: Large bowel has a normal course and caliber. Scattered colonic stool. Stomach and small bowel are also nondilated. Few loops of small bowel are fluid-filled but again nondilated. Vascular/Lymphatic: Extensive vascular calcifications identified diffusely. Potential areas of severe disease particularly along the iliac vessels. Please correlate for areas of stenosis or occlusion. No discrete abnormal lymph node enlargement identified in the abdomen and pelvis. Reproductive: Uterus and bilateral adnexa are unremarkable. Other: Anasarca identified. No free air or free fluid. No retroperitoneal hematoma identified. There is streak artifact from overlapping structures. Musculoskeletal: Curvature and degenerative changes along the spine. IMPRESSION: No evidence of retroperitoneal hematoma. Severe atherosclerotic calcified plaque with areas of likely high-grade stenosis or occlusion of the iliac vessels. Please correlate with clinical history. Several  other branch vessels also severe disease. Numerous complex and dense bilateral renal lesions. These very well could be proteinaceous or hemorrhagic but with the liver complexity a solid lesion or neoplasm is not excluded. Recommend additional workup when clinically appropriate and able. Small cystic lesion along the midbody of the pancreas not seen in 2022. Recommend follow-up study such as MRI further characterize when appropriate. Alternatively a follow-up in 6 months could be performed. Bilateral pleural effusions with adjacent lung opacities, right-greater-than-left. Atelectasis versus infiltrate. Recommend follow-up. Enlarged heart.  Pericardial effusion. Electronically Signed   By: Karen Kays M.D.   On: 04/07/2023 16:44   DG Chest Port 1 View  Result Date: 04/06/2023 CLINICAL DATA:  Shortness of breath Cough EXAM: PORTABLE CHEST - 1 VIEW COMPARISON:  10/30/2022 FINDINGS: Mild cardiomegaly Mild pulmonary vascular congestion.  Bilateral airspace opacities. IMPRESSION: 1. Mild cardiomegaly. 2. Bilateral airspace opacities likely due to pulmonary edema, however concurrent pneumonia also possible. Electronically Signed   By: Acquanetta Belling M.D.   On: 04/06/2023 10:02     Subjective: - no chest pain, shortness of breath, no abdominal pain, nausea or vomiting.   Discharge Exam: BP (!) 158/68 (BP Location: Left Arm)   Pulse 77   Temp 98.5 F (36.9 C) (Oral)   Resp 17   Ht 5\' 2"  (1.575 m)   Wt 47.8 kg   SpO2 94%   BMI 19.27 kg/m   General: Pt is alert, awake, not in acute distress Cardiovascular: RRR, S1/S2 +, no rubs, no gallops Respiratory: CTA bilaterally, no wheezing, no rhonchi Abdominal: Soft, NT, ND, bowel sounds + Extremities: no edema, no cyanosis  The results of significant diagnostics from this hospitalization (including imaging, microbiology, ancillary and laboratory) are listed below for reference.     Microbiology: Recent Results (from the past 240 hour(s))  SARS  Coronavirus 2 by RT PCR (hospital order, performed in [facility] hospital lab) *cepheid single result test* Anterior Nasal Swab     Status: None   Collection Time: 04/06/23  9:46 AM   Specimen: Anterior Nasal Swab  Result Value Ref Range Status   SARS Coronavirus  2 by RT PCR NEGATIVE NEGATIVE Final    Comment: (NOTE) SARS-CoV-2 target nucleic acids are NOT DETECTED.  The SARS-CoV-2 RNA is generally detectable in upper and lower respiratory specimens during the acute phase of infection. The lowest concentration of SARS-CoV-2 viral copies this assay can detect is 250 copies / mL. A negative result does not preclude SARS-CoV-2 infection and should not be used as the sole basis for treatment or other patient management decisions.  A negative result may occur with improper specimen collection / handling, submission of specimen other than nasopharyngeal swab, presence of viral mutation(s) within the areas targeted by this assay, and inadequate number of viral copies (<250 copies / mL). A negative result must be combined with clinical observations, patient history, and epidemiological information.  Fact Sheet for Patients:   RoadLapTop.co.za  Fact Sheet for Healthcare Providers: http://kim-miller.com/  This test is not yet approved or  cleared by the [place] FDA and has been authorized for detection and/or diagnosis of SARS-CoV-2 by FDA under an Emergency Use Authorization (EUA).  This EUA will remain in effect (meaning this test can be used) for the duration of the COVID-19 declaration under Section 564(b)(1) of the Act, 21 U.S.C. section 360bbb-3(b)(1), unless the authorization is terminated or revoked sooner.  Performed at [facility], [address] [place], [place] [address]   Culture, blood (routine x 2)     Status: None   Collection Time: 04/06/23  9:46 AM   Specimen: BLOOD  Result Value Ref Range Status    Specimen Description   Final    BLOOD BLOOD RIGHT ARM Performed at [facility], [address], [place], [place] [address]    Special Requests   Final    BOTTLES DRAWN AEROBIC AND ANAEROBIC Blood Culture adequate volume Performed at [facility], [address], [place], [place] [address]    Culture   Final    NO GROWTH 5 DAYS Performed at [facility], [address], [place], [place] [address]    Report Status 04/11/2023 FINAL  Final  Culture, blood (routine x 2)     Status: None   Collection Time: 04/06/23 10:06 AM   Specimen: BLOOD  Result Value Ref Range Status   Specimen Description   Final    BLOOD BLOOD RIGHT FOREARM Performed at [facility], [address] [place], [place] [address]    Special Requests   Final    BOTTLES DRAWN AEROBIC AND ANAEROBIC Blood Culture adequate volume Performed at [facility], [address], [place], [place] [address]    Culture   Final    NO GROWTH 5 DAYS Performed at [facility], [address] [place], [place] [address]    Report Status 04/11/2023 FINAL  Final     Labs: Basic Metabolic Panel: Recent Labs  Lab 04/09/23 0408 04/10/23 0440 04/11/23 0409 04/12/23 0414 04/13/23 0430  NA 141 139 138 139 139  K 3.6 3.7 3.4* 3.6 3.7  CL 102 101 100 101 102  CO2 27 24 21* 24 23  GLUCOSE 103* 100* 104* 106* 107*  BUN 76* 78* 81* 78* 79*  CREATININE 4.90* 4.98* 5.19* 5.19* 5.27*  CALCIUM 8.3* 8.4* 8.5* 8.8* 8.4*  MG 2.1  --  2.0 1.8 1.8  PHOS  --  4.7* 4.8*  --   --    Liver Function Tests: Recent Labs  Lab 04/08/23 9629 04/10/23 0440 04/11/23 0409  AST 14*  --   --   ALT 10  --   --   ALKPHOS 65  --   --   BILITOT 0.9  --   --   PROT 6.3*  --   --   ALBUMIN 3.0* 2.9* 3.2*   CBC: Recent Labs  Lab 04/07/23 0421 04/08/23 0635 04/09/23 0408 04/10/23 0440 04/11/23 0409 04/13/23 0430  WBC 7.5 8.0 7.2  --  6.9 7.1  HGB 6.0*  7.6* 7.2* 7.3* 7.9* 8.2*  HCT 19.3* 23.6* 23.0* 23.0* 24.3* 25.9*  MCV 88.9 84.9 85.5  --  84.1 86.6  PLT 189 197 186  --  206 204   CBG: No results for input(s): "GLUCAP" in the last 168 hours. Hgb A1c No results for input(s): "HGBA1C" in the last 72 hours. Lipid Profile No results for input(s): "CHOL", "HDL", "LDLCALC", "TRIG", "CHOLHDL", "LDLDIRECT" in the last 72 hours. Thyroid function studies No results for input(s): "TSH", "T4TOTAL", "T3FREE", "THYROIDAB" in the last 72 hours.  Invalid input(s): "FREET3" Urinalysis    Component Value Date/Time   COLORURINE YELLOW 10/29/2022 0358   APPEARANCEUR CLEAR 10/29/2022 0358   LABSPEC 1.010 10/29/2022 0358   PHURINE 6.0 10/29/2022 0358   GLUCOSEU NEGATIVE 10/29/2022 0358   GLUCOSEU NEGATIVE 08/11/2021 0942   HGBUR NEGATIVE 10/29/2022 0358   BILIRUBINUR NEGATIVE 10/29/2022 0358   BILIRUBINUR Negative 01/19/2019 1510   KETONESUR NEGATIVE 10/29/2022 0358   PROTEINUR >=300 (A) 10/29/2022 0358   UROBILINOGEN 0.2 08/11/2021 0942   NITRITE NEGATIVE 10/29/2022 0358   LEUKOCYTESUR NEGATIVE 10/29/2022 0358    FURTHER DISCHARGE INSTRUCTIONS:   Get Medicines reviewed and adjusted: Please take all your medications with you for your next visit with your Primary MD   Laboratory/radiological data: Please request your Primary MD to go over all hospital tests and procedure/radiological results at the follow up, please ask your Primary MD to get all Hospital records sent to his/her office.   In some cases, they will be blood work, cultures and biopsy results pending at the time of your discharge. Please request that your primary care M.D. goes through all the records of your hospital data and follows up on these results.   Also Note the following: If you experience worsening of your admission symptoms, develop shortness of breath, life threatening emergency, suicidal or homicidal thoughts you must seek medical attention immediately by calling  911 or calling your MD immediately  if symptoms less severe.   You must read complete instructions/literature along with all the possible adverse reactions/side effects for all the Medicines you take and that have been prescribed to you. Take any new Medicines after you have completely understood and accpet all the possible adverse reactions/side effects.    Do not drive when taking Pain medications or sleeping medications (Benzodaizepines)   Do not take more than prescribed Pain, Sleep and Anxiety Medications. It is not advisable to combine anxiety,sleep and pain medications without talking with your primary care practitioner   Special Instructions: If you have smoked or chewed Tobacco  in the last 2 yrs please stop smoking, stop any regular Alcohol  and or any Recreational drug use.   Wear Seat belts while driving.   Please note: You were cared for by a hospitalist during your hospital stay. Once you are discharged, your primary care physician will handle any further medical issues. Please note that NO REFILLS for any discharge medications will be authorized once you are discharged, as it is imperative that you return  to your primary care physician (or establish a relationship with a primary care physician if you do not have one) for your post hospital discharge needs so that they can reassess your need for medications and monitor your lab values.  Time coordinating discharge: 40 minutes  SIGNED:  Pamella Pert, MD, PhD 04/13/2023, 10:33 AM

## 2023-04-13 NOTE — Progress Notes (Signed)
Pt being discharged to home at this time. Prior to DC, IV and tele were removed. Pt was given DC paperwork regarding medications, condition, and follow up. Pt and son verbalized understanding and stated no other concerns at this time. Pt stable at time of DC and left in personal vehicle driven by son.

## 2023-04-13 NOTE — Discharge Instructions (Signed)
Follow with Myrlene Broker, MD in 5-7 days  Please get a complete blood count and chemistry panel checked by your Primary MD at your next visit, and again as instructed by your Primary MD. Please get your medications reviewed and adjusted by your Primary MD.  Please request your Primary MD to go over all Hospital Tests and Procedure/Radiological results at the follow up, please get all Hospital records sent to your Prim MD by signing hospital release before you go home.  In some cases, there will be blood work, cultures and biopsy results pending at the time of your discharge. Please request that your primary care M.D. goes through all the records of your hospital data and follows up on these results.  If you had Pneumonia of Lung problems at the Hospital: Please get a 2 view Chest X ray done in 6-8 weeks after hospital discharge or sooner if instructed by your Primary MD.  If you have Congestive Heart Failure: Please call your Cardiologist or Primary MD anytime you have any of the following symptoms:  1) 3 pound weight gain in 24 hours or 5 pounds in 1 week  2) shortness of breath, with or without a dry hacking cough  3) swelling in the hands, feet or stomach  4) if you have to sleep on extra pillows at night in order to breathe  Follow cardiac low salt diet and 1.5 lit/day fluid restriction.  If you have diabetes Accuchecks 4 times/day, Once in AM empty stomach and then before each meal. Log in all results and show them to your primary doctor at your next visit. If any glucose reading is under 80 or above 300 call your primary MD immediately.  If you have Seizure/Convulsions/Epilepsy: Please do not drive, operate heavy machinery, participate in activities at heights or participate in high speed sports until you have seen by Primary MD or a Neurologist and advised to do so again. Per [facility] statutes, patients with seizures are not allowed to drive until they have been  seizure-free for six months.  Use caution when using heavy equipment or power tools. Avoid working on ladders or at heights. Take showers instead of baths. Ensure the water temperature is not too high on the home water heater. Do not go swimming alone. Do not lock yourself in a room alone (i.e. bathroom). When caring for infants or small children, sit down when holding, feeding, or changing them to minimize risk of injury to the child in the event you have a seizure. Maintain good sleep hygiene. Avoid alcohol.   If you had Gastrointestinal Bleeding: Please ask your Primary MD to check a complete blood count within one week of discharge or at your next visit. Your endoscopic/colonoscopic biopsies that are pending at the time of discharge, will also need to followed by your Primary MD.  Get Medicines reviewed and adjusted. Please take all your medications with you for your next visit with your Primary MD  Please request your Primary MD to go over all hospital tests and procedure/radiological results at the follow up, please ask your Primary MD to get all Hospital records sent to his/her office.  If you experience worsening of your admission symptoms, develop shortness of breath, life threatening emergency, suicidal or homicidal thoughts you must seek medical attention immediately by calling 911 or calling your MD immediately  if symptoms less severe.  You must read complete instructions/literature along with all the possible adverse reactions/side effects for all the Medicines you  take and that have been prescribed to you. Take any new Medicines after you have completely understood and accpet all the possible adverse reactions/side effects.   Do not drive or operate heavy machinery when taking Pain medications.   Do not take more than prescribed Pain, Sleep and Anxiety Medications  Special Instructions: If you have smoked or chewed Tobacco  in the last 2 yrs please stop smoking, stop any regular  Alcohol  and or any Recreational drug use.  Wear Seat belts while driving.  Please note You were cared for by a hospitalist during your hospital stay. If you have any questions about your discharge medications or the care you received while you were in the hospital after you are discharged, you can call the unit and asked to speak with the hospitalist on call if the hospitalist that took care of you is not available. Once you are discharged, your primary care physician will handle any further medical issues. Please note that NO REFILLS for any discharge medications will be authorized once you are discharged, as it is imperative that you return to your primary care physician (or establish a relationship with a primary care physician if you do not have one) for your aftercare needs so that they can reassess your need for medications and monitor your lab values.  You can reach the hospitalist office at phone [phone] or fax [phone]   If you do not have a primary care physician, you can call [phone] for a physician referral.  Activity: As tolerated with Full fall precautions use walker/cane & assistance as needed    Diet: renal, 1500 mL fluids restriction  Disposition Home

## 2023-04-13 NOTE — Progress Notes (Signed)
Heart Failure Navigator Progress Note  Assessed for Heart & Vascular TOC clinic readiness.  Patient does not meet criteria due to EF 60-65%, CKD V, per MD note patient will follow up with PCP in 1-2 weeks and Nephrology 1-2 weeks. .   Navigator available for reassessment of patient.   Rhae Hammock, BSN, Scientist, clinical (histocompatibility and immunogenetics) Only

## 2023-04-15 ENCOUNTER — Encounter: Payer: Self-pay | Admitting: *Deleted

## 2023-04-15 ENCOUNTER — Telehealth: Payer: Self-pay | Admitting: *Deleted

## 2023-04-15 NOTE — Transitions of Care (Post Inpatient/ED Visit) (Signed)
   04/15/2023  Name: COURTNAY PETRILLA MRN: [id number] DOB: Jan 24, 1946  Today's TOC FU Call Status: Today's TOC FU Call Status:: Unsuccessul Call (1st Attempt) Unsuccessful Call (1st Attempt) Date: 04/15/23  Attempted to reach the patient regarding the most recent Inpatient visit; call attempted using [facility] per alert on snaphot in EHR; [phone] Quick, [place] [id number]; left HIPAA compliant voice message requesting call back via interpreter services  Follow Up Plan: Additional outreach attempts will be made to reach the patient to complete the Transitions of Care (Post Inpatient visit) call.   Caryl Pina, RN, BSN, CCRN Alumnus RN CM Care Coordination/ Transition of Care- [facility] Care Management [phone]: direct office

## 2023-04-16 ENCOUNTER — Telehealth: Payer: Self-pay | Admitting: *Deleted

## 2023-04-16 ENCOUNTER — Encounter: Payer: Self-pay | Admitting: *Deleted

## 2023-04-16 NOTE — Transitions of Care (Post Inpatient/ED Visit) (Signed)
   04/16/2023  Name: ALAYZIAH GOTSCH MRN: [id number] DOB: 1946/11/28  Today's TOC FU Call Status: Today's TOC FU Call Status:: Unsuccessful Call (2nd Attempt) Unsuccessful Call (2nd Attempt) Date: 04/16/23  Attempted to reach the patient regarding the most recent Inpatient visit; call placed using [employer] as per alert on snapshot in EHR: Scarlett ID [id number]   Follow Up Plan: Additional outreach attempts will be made to reach the patient to complete the Transitions of Care (Post Inpatient visit) call.   Caryl Pina, RN, BSN, CCRN Alumnus RN CM Care Coordination/ Transition of Care- [facility] Care Management [phone]: direct office

## 2023-04-19 ENCOUNTER — Encounter: Payer: Self-pay | Admitting: *Deleted

## 2023-04-19 ENCOUNTER — Telehealth: Payer: Self-pay | Admitting: *Deleted

## 2023-04-19 NOTE — Transitions of Care (Post Inpatient/ED Visit) (Signed)
   04/19/2023  Name: [name] MRN: [id number] DOB: May 14, 1946  Today's TOC FU Call Status: Today's TOC FU Call Status:: Unsuccessful Call (3rd Attempt) Unsuccessful Call (3rd Attempt) Date: 04/19/23  Attempted to reach the patient regarding the most recent Inpatient visit; Call facilitated by [facility], [phone] interpreter "Zollie Beckers ID provided [phone] --left HIPAA compliant voice message requesting call back  Follow Up Plan: No further outreach attempts will be made at this time. We have been unable to contact the patient.  Caryl Pina, RN, BSN, CCRN Alumnus RN CM Care Coordination/ Transition of Care- [facility] Care Management [phone]: direct office

## 2023-05-17 ENCOUNTER — Telehealth: Payer: Self-pay | Admitting: Internal Medicine

## 2023-05-17 NOTE — Telephone Encounter (Signed)
Pt daughter called stating that Dr Okey Dupre sometimes prescribe antibiotics due to  a thick green mucus in her throat. Please advise.

## 2023-05-19 NOTE — Telephone Encounter (Signed)
Pt states that she has no other symptoms and no signs of SOB symptoms started last week.

## 2023-05-19 NOTE — Telephone Encounter (Signed)
I would just monitor for now. If any change in breathing or trach let us know. I see she is coming in next week.

## 2023-05-19 NOTE — Telephone Encounter (Signed)
Is she having current symptoms and if so is there any SOB, when did symptoms start?

## 2023-05-20 NOTE — Telephone Encounter (Signed)
Spoke with the patient daughter in law and she verbalized she understood and if there are any changes to give [place] a call before coming in next week

## 2023-05-26 ENCOUNTER — Ambulatory Visit: Payer: Medicare Other | Admitting: Internal Medicine

## 2023-05-31 DIAGNOSIS — N2581 Secondary hyperparathyroidism of renal origin: Secondary | ICD-10-CM | POA: Diagnosis not present

## 2023-05-31 DIAGNOSIS — Z7189 Other specified counseling: Secondary | ICD-10-CM | POA: Diagnosis not present

## 2023-05-31 DIAGNOSIS — I120 Hypertensive chronic kidney disease with stage 5 chronic kidney disease or end stage renal disease: Secondary | ICD-10-CM | POA: Diagnosis not present

## 2023-05-31 DIAGNOSIS — D631 Anemia in chronic kidney disease: Secondary | ICD-10-CM | POA: Diagnosis not present

## 2023-05-31 DIAGNOSIS — N2889 Other specified disorders of kidney and ureter: Secondary | ICD-10-CM | POA: Diagnosis not present

## 2023-05-31 DIAGNOSIS — N185 Chronic kidney disease, stage 5: Secondary | ICD-10-CM | POA: Diagnosis not present

## 2023-05-31 DIAGNOSIS — E872 Acidosis, unspecified: Secondary | ICD-10-CM | POA: Diagnosis not present

## 2023-05-31 DIAGNOSIS — I5030 Unspecified diastolic (congestive) heart failure: Secondary | ICD-10-CM | POA: Diagnosis not present

## 2023-06-01 ENCOUNTER — Ambulatory Visit: Payer: Medicare Other | Admitting: Internal Medicine

## 2023-06-01 LAB — LAB REPORT - SCANNED: EGFR: 7

## 2023-06-02 ENCOUNTER — Other Ambulatory Visit: Payer: Self-pay | Admitting: Internal Medicine

## 2023-06-04 ENCOUNTER — Other Ambulatory Visit: Payer: Self-pay

## 2023-06-04 ENCOUNTER — Emergency Department (HOSPITAL_COMMUNITY)
Admission: EM | Admit: 2023-06-04 | Discharge: 2023-06-04 | Disposition: A | Discharge status: Home / Self Care | Payer: Medicare Other | Attending: Emergency Medicine | Admitting: Emergency Medicine

## 2023-06-04 DIAGNOSIS — J449 Chronic obstructive pulmonary disease, unspecified: Secondary | ICD-10-CM | POA: Insufficient documentation

## 2023-06-04 DIAGNOSIS — D649 Anemia, unspecified: Secondary | ICD-10-CM | POA: Insufficient documentation

## 2023-06-04 DIAGNOSIS — I509 Heart failure, unspecified: Secondary | ICD-10-CM | POA: Insufficient documentation

## 2023-06-04 DIAGNOSIS — Z7982 Long term (current) use of aspirin: Secondary | ICD-10-CM | POA: Insufficient documentation

## 2023-06-04 DIAGNOSIS — N184 Chronic kidney disease, stage 4 (severe): Secondary | ICD-10-CM | POA: Diagnosis not present

## 2023-06-04 DIAGNOSIS — I130 Hypertensive heart and chronic kidney disease with heart failure and stage 1 through stage 4 chronic kidney disease, or unspecified chronic kidney disease: Secondary | ICD-10-CM | POA: Insufficient documentation

## 2023-06-04 DIAGNOSIS — Z8542 Personal history of malignant neoplasm of other parts of uterus: Secondary | ICD-10-CM | POA: Diagnosis not present

## 2023-06-04 DIAGNOSIS — Z79899 Other long term (current) drug therapy: Secondary | ICD-10-CM | POA: Insufficient documentation

## 2023-06-04 LAB — COMPREHENSIVE METABOLIC PANEL
ALT: 11 U/L (ref 0–44)
AST: 15 U/L (ref 15–41)
Albumin: 3.3 g/dL — ABNORMAL LOW (ref 3.5–5.0)
Alkaline Phosphatase: 63 U/L (ref 38–126)
Anion gap: 13 (ref 5–15)
BUN: 98 mg/dL — ABNORMAL HIGH (ref 8–23)
CO2: 22 mmol/L (ref 22–32)
Calcium: 8.4 mg/dL — ABNORMAL LOW (ref 8.9–10.3)
Chloride: 102 mmol/L (ref 98–111)
Creatinine, Ser: 5.21 mg/dL — ABNORMAL HIGH (ref 0.44–1.00)
GFR, Estimated: 8 mL/min — ABNORMAL LOW (ref >60)
Glucose, Bld: 119 mg/dL — ABNORMAL HIGH (ref 70–99)
Potassium: 4.3 mmol/L (ref 3.5–5.1)
Sodium: 137 mmol/L (ref 135–145)
Total Bilirubin: 0.5 mg/dL (ref 0.3–1.2)
Total Protein: 6.8 g/dL (ref 6.5–8.1)

## 2023-06-04 LAB — CBC
HCT: 20.3 % — ABNORMAL LOW (ref 36.0–46.0)
Hemoglobin: 6.5 g/dL — CL (ref 12.0–15.0)
MCH: 28.8 pg (ref 26.0–34.0)
MCHC: 32 g/dL (ref 30.0–36.0)
MCV: 89.8 fL (ref 80.0–100.0)
Platelets: 180 10*3/uL (ref 150–400)
RBC: 2.26 MIL/uL — ABNORMAL LOW (ref 3.87–5.11)
RDW: 15.5 % (ref 11.5–15.5)
WBC: 7 10*3/uL (ref 4.0–10.5)
nRBC: 0 % (ref 0.0–0.2)

## 2023-06-04 LAB — TYPE AND SCREEN: Unit division: 0

## 2023-06-04 LAB — PREPARE RBC (CROSSMATCH)

## 2023-06-04 LAB — BPAM RBC: Blood Product Expiration Date: 202407242359

## 2023-06-04 MED ORDER — SODIUM CHLORIDE 0.9% IV SOLUTION: Freq: Once | INTRAVENOUS | Status: AC

## 2023-06-04 NOTE — ED Provider Notes (Signed)
[place] EMERGENCY DEPARTMENT AT [facility] Provider Note   CSN: [id number] Arrival date & time: 06/04/23  2440     History {Add pertinent medical, surgical, social history, OB history to HPI:1} No chief complaint on file.   [name] is a 77 y.o. female.  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    No black or bloody stools, vaginal bleeding, or hematuria, nor falls or trauma   Did take BP medications, 130/70   Past Medical History:  Diagnosis Date   Anemia    Anxiety    takes Ativan prn   Blood transfusion without reported diagnosis 09/15/12   2 units Prbc's   Broken ribs    Chronic back pain    CKD (chronic kidney disease), stage IV (HCC)    Constipation    related to pain meds   COPD (chronic obstructive pulmonary disease) (HCC) 08/10/2012   denies   Depression    Gastrostomy in place [facility])    removed   GERD (gastroesophageal reflux disease)    takes Zantac daily   Headache(784.0)    Hiatal hernia 08/10/2012   History of radiation therapy 10/17/12-11/25/12   supraglottic larynx,high risk neck tumor bed 5880 cGy/28 sessions, high risk lymph node tumor bed 5600 cGy/20 sessions, mod risk lymph node tumor bed 5040 cGy/20 sessions   Hypercholesteremia    takes Pravastatin daily   Hypertension    takes Tribenzor and Atenolol daily   Insomnia    takes Amitriptyline daily   Nausea    takes Zofran prn   PAD (peripheral artery disease) (HCC)    noninvasive imaging in 2016   Pneumonia    SCC (squamous  cell carcinoma) of supraglottis area 08/08/2012   Shortness of breath dyspnea    Stroke [facility]) 2011   denies. no residual   Uterine cancer (HCC)         Home Medications Prior to Admission medications   Medication Sig Start Date End Date Taking? Authorizing Provider  acetaminophen-codeine (TYLENOL #3) 300-30 MG tablet TAKE 1 TABLET BY MOUTH EVERY 6 HOURS AS NEEDED Patient taking differently: Take 1 tablet by mouth at bedtime. 02/18/23   Myrlene Broker, MD  aspirin EC 81 MG tablet Take 1 tablet (81 mg total) by mouth daily. Swallow whole. 10/31/22   Regalado, Belkys A, MD  Camphor-Menthol-Methyl Sal (SALONPAS EX) Apply 1 patch topically as needed (pain).    [provider]  carvedilol (COREG) 25 MG tablet TAKE 1 TABLET (25 MG TOTAL) BY MOUTH TWICE A DAY WITH MEALS Patient taking differently: Take 25 mg by mouth in the morning and at bedtime. 12/30/22   Myrlene Broker, MD  cilostazol (PLETAL) 100 MG tablet TAKE 1 TABLET BY MOUTH TWICE A DAY Patient taking differently: Take 100 mg by mouth 2 (two) times daily. 01/18/23   Myrlene Broker, MD  cloNIDine (CATAPRES - DOSED IN MG/24 HR) 0.1 mg/24hr patch Place 1 patch (0.1 mg total) onto the skin once a week. 09/18/22   Ghimire,  Lyndel Safe, MD  cyclobenzaprine (FLEXERIL) 5 MG tablet TAKE 0.5 TABLETS (2.5 MG TOTAL) BY MOUTH AT BEDTIME AS NEEDED FOR MUSCLE SPASMS. Patient taking differently: Take 5 mg by mouth at bedtime. 01/18/23   Myrlene Broker, MD  diphenoxylate-atropine (LOMOTIL) 2.5-0.025 MG tablet Take 1 tablet by mouth daily as needed for diarrhea or loose stools. 12/30/22   Myrlene Broker, MD  DULoxetine (CYMBALTA) 30 MG capsule Take 1 capsule (30 mg total) by mouth daily. 11/17/22   Myrlene Broker, MD  ELIQUIS 2.5 MG TABS tablet TAKE 1 TABLET BY MOUTH TWICE A DAY 03/26/23   Myrlene Broker, MD  furosemide (LASIX) 40 MG tablet Take 2 tablets (80 mg total) by mouth 2 (two) times daily. For increase  edema Patient taking differently: Take 40-80 mg by mouth 2 (two) times daily. For increase edema 03/10/23   Myrlene Broker, MD  guaiFENesin (MUCINEX) 600 MG 12 hr tablet Take 2 tablets (1,200 mg total) by mouth 2 (two) times daily. Patient not taking: Reported on 03/10/2023 10/31/22   Regalado, Jon Billings A, MD  hydrALAZINE (APRESOLINE) 100 MG tablet Take 1 pill 3 times a day (total dose 125 mg with 25 mg tablet) Patient taking differently: Take 100 mg by mouth 3 (three) times daily. Take 100 mg 3 times a day (total dose 125 mg with 25 mg tablet) 03/10/23   Myrlene Broker, MD  hydrALAZINE (APRESOLINE) 25 MG tablet Give 1 pill TID (1 of 100 mg and 1 of 25 mg tablets 3 times a day for total dose 125 mg TID) Hold if SBP less than 120 Patient taking differently: Take 25 mg by mouth 3 (three) times daily. Take 25 mg 3 times daily. Take with 100 mg to equal 125 mg 03/10/23   Myrlene Broker, MD  ipratropium-albuterol (DUONEB) 0.5-2.5 (3) MG/3ML SOLN Take 3 mLs by nebulization 2 (two) times daily. Patient not taking: Reported on 03/10/2023 10/31/22   Hartley Barefoot A, MD  isosorbide mononitrate (IMDUR) 60 MG 24 hr tablet TAKE 1 TABLET BY MOUTH EVERY DAY 06/02/23   Myrlene Broker, MD  levothyroxine (SYNTHROID) 50 MCG tablet Take 1 tablet (50 mcg total) by mouth daily before breakfast. Additional refill from pcp 10/20/22   Nche, Bonna Gains, NP  LORazepam (ATIVAN) 1 MG tablet TAKE 1 TABLET BY MOUTH 2 TIMES DAILY AS NEEDED FOR ANXIETY. Patient taking differently: Take 1 mg by mouth 2 (two) times daily. 03/01/23   Myrlene Broker, MD  minoxidil (LONITEN) 2.5 MG tablet Take 1 tablet (2.5 mg total) by mouth 2 (two) times daily. 10/23/22   Myrlene Broker, MD  mirtazapine (REMERON) 15 MG tablet TAKE 1 TABLET BY MOUTH EVERYDAY AT BEDTIME Patient taking differently: Take 15 mg by mouth at bedtime. 03/22/23   Myrlene Broker, MD  nitroGLYCERIN (NITROSTAT) 0.4 MG SL tablet Place 1  tablet (0.4 mg total) under the tongue every 5 (five) minutes x 3 doses as needed for chest pain. 10/31/22   Regalado, Belkys A, MD  omeprazole (PRILOSEC) 40 MG capsule Take 1 capsule (40 mg total) by mouth daily. 03/10/23   Myrlene Broker, MD  ondansetron (ZOFRAN) 4 MG tablet TAKE 1 TABLET BY MOUTH EVERY 8 HOURS AS NEEDED FOR NAUSEA AND VOMITING Patient taking differently: Take 4 mg by mouth as needed for nausea or vomiting. 01/15/23   Myrlene Broker, MD  PE-diphenhydrAMINE-DM-GG-APAP (DELSYM DAY NIGHT PO) Take 20 mLs by mouth 2 (two) times daily as needed (cough).  [provider]  rosuvastatin (CRESTOR) 20 MG tablet TAKE 1 TABLET BY MOUTH EVERY DAY 02/18/23   Myrlene Broker, MD  sodium bicarbonate 650 MG tablet Take 650 mg by mouth 3 (three) times daily. 10/01/22   [provider]  SYRINGE-NEEDLE, DISP, 3 ML (B-D INTEGRA SYRINGE) 23G X 1" 3 ML MISC Use with zofran injections 03/10/23   Myrlene Broker, MD  levocetirizine (XYZAL) 5 MG tablet TAKE 1 TABLET(5 MG) BY MOUTH EVERY EVENING 02/21/20 06/10/20  Etta Grandchild, MD      Allergies    Xyzal [levocetirizine dihydrochloride], Augmentin [amoxicillin-pot clavulanate], and Tribenzor [olmesartan-amlodipine-hctz]    Review of Systems   Review of Systems  Physical Exam Updated Vital Signs BP (!) 217/108 (BP Location: Right Arm)   Pulse 89   Temp 98.6 F (37 C) (Oral)   Resp 16   Ht 5\' 2"  (1.575 m)   Wt 48 kg   SpO2 99%   BMI 19.35 kg/m  Physical Exam  ED Results / Procedures / Treatments   Labs (all labs ordered are listed, but only abnormal results are displayed) Labs Reviewed  CBC  COMPREHENSIVE METABOLIC PANEL  TYPE AND SCREEN    EKG None  Radiology No results found.  Procedures Procedures  {Document cardiac monitor, telemetry assessment procedure when appropriate:1}  Medications Ordered in ED Medications - No data to display  ED Course/ Medical Decision Making/ A&P   {    Click here for ABCD2, HEART and other calculatorsREFRESH Note before signing :1}                          Medical Decision Making Amount and/or Complexity of Data Reviewed Labs: ordered.   ***  {Document critical care time when appropriate:1} {Document review of labs and clinical decision tools ie heart score, Chads2Vasc2 etc:1}  {Document your independent review of radiology images, and any outside records:1} {Document your discussion with family members, caretakers, and with consultants:1} {Document social determinants of health affecting pt's care:1} {Document your decision making why or why not admission, treatments were needed:1} Final Clinical Impression(s) / ED Diagnoses Final diagnoses:  None    Rx / DC Orders ED Discharge Orders     None

## 2023-06-04 NOTE — ED Notes (Signed)
Doctor notified of hemoglobin of 6.5  by phone

## 2023-06-04 NOTE — ED Triage Notes (Signed)
Pt arrived via POV. No physical complaints. PCP told her she needed to get a blood transfusion.  Pt has a trach Hypertensive in triage.  AOX4

## 2023-06-05 LAB — BPAM RBC
ISSUE DATE / TIME: 202406211142
Unit Type and Rh: 5100

## 2023-06-05 LAB — TYPE AND SCREEN
ABO/RH(D): O POS
Antibody Screen: NEGATIVE

## 2023-06-07 ENCOUNTER — Other Ambulatory Visit: Payer: Self-pay | Admitting: Internal Medicine

## 2023-06-11 ENCOUNTER — Ambulatory Visit (HOSPITAL_COMMUNITY)
Admission: RE | Admit: 2023-06-11 | Discharge: 2023-06-11 | Disposition: A | Discharge status: Home / Self Care | Payer: Medicare Other | Source: Ambulatory Visit | Attending: Nephrology | Admitting: Nephrology

## 2023-06-11 VITALS — BP 173/58 | HR 80 | Temp 97.3°F | Resp 17

## 2023-06-11 DIAGNOSIS — N185 Chronic kidney disease, stage 5: Secondary | ICD-10-CM | POA: Diagnosis not present

## 2023-06-11 DIAGNOSIS — D631 Anemia in chronic kidney disease: Secondary | ICD-10-CM | POA: Diagnosis not present

## 2023-06-11 LAB — POCT HEMOGLOBIN-HEMACUE: Hemoglobin: 7.7 g/dL — ABNORMAL LOW (ref 12.0–15.0)

## 2023-06-11 MED ORDER — EPOETIN ALFA-EPBX 10000 UNIT/ML IJ SOLN
5000.0000 [IU] | INTRAMUSCULAR | Status: DC
  Administered 2023-06-11: 5000 [IU] via SUBCUTANEOUS

## 2023-06-11 MED ORDER — CLONIDINE HCL 0.1 MG PO TABS
ORAL_TABLET | ORAL | Status: AC
  Filled 2023-06-11: qty 1

## 2023-06-11 MED ORDER — CLONIDINE HCL 0.1 MG PO TABS
0.1000 mg | ORAL_TABLET | Freq: Once | ORAL | Status: AC | PRN
  Administered 2023-06-11: 0.1 mg via ORAL

## 2023-06-11 MED ORDER — EPOETIN ALFA-EPBX 10000 UNIT/ML IJ SOLN
INTRAMUSCULAR | Status: AC
  Filled 2023-06-11: qty 1

## 2023-06-11 NOTE — Progress Notes (Signed)
Patient here for first visit of Retacrit injection. Elevated BP (185-215/60s-80s). Hemocue 7.7, up from 6.5 after 1 unit blood on 06/04/23 at [facility]. Dr. Thedore Mins cell called per his office and informed. Made aware that pt has Clonidine patch on and took all BP meds prior to arrival. Order received to give oral dose of 0.1 mg Clonidine and administer subcu Retacrit if SBP 180 or less. If not, do not give injection and patient to return in 2 weeks as scheduled. Patient and patient's family verbalized understanding.

## 2023-06-16 ENCOUNTER — Other Ambulatory Visit: Payer: Self-pay | Admitting: Internal Medicine

## 2023-06-21 ENCOUNTER — Other Ambulatory Visit: Payer: Self-pay | Admitting: Internal Medicine

## 2023-06-22 ENCOUNTER — Other Ambulatory Visit: Payer: Self-pay

## 2023-06-22 MED ORDER — ACETAMINOPHEN-CODEINE 300-30 MG PO TABS: 1.0000 | ORAL_TABLET | Freq: Four times a day (QID) | ORAL | 2 refills | Status: DC | PRN

## 2023-06-22 NOTE — Telephone Encounter (Signed)
Pt needs a refill for medication

## 2023-06-25 ENCOUNTER — Encounter (HOSPITAL_COMMUNITY): Payer: Medicare Other

## 2023-07-04 ENCOUNTER — Other Ambulatory Visit: Payer: Self-pay | Admitting: Internal Medicine

## 2023-07-07 ENCOUNTER — Emergency Department (HOSPITAL_COMMUNITY): Payer: Medicare Other

## 2023-07-07 ENCOUNTER — Encounter (HOSPITAL_COMMUNITY): Payer: Self-pay

## 2023-07-07 ENCOUNTER — Other Ambulatory Visit: Payer: Self-pay

## 2023-07-07 ENCOUNTER — Emergency Department (HOSPITAL_COMMUNITY)
Admission: EM | Admit: 2023-07-07 | Discharge: 2023-07-07 | Disposition: A | Discharge status: Home / Self Care | Payer: Medicare Other | Attending: Emergency Medicine | Admitting: Emergency Medicine

## 2023-07-07 DIAGNOSIS — Z923 Personal history of irradiation: Secondary | ICD-10-CM | POA: Diagnosis not present

## 2023-07-07 DIAGNOSIS — N189 Chronic kidney disease, unspecified: Secondary | ICD-10-CM | POA: Diagnosis not present

## 2023-07-07 DIAGNOSIS — N179 Acute kidney failure, unspecified: Secondary | ICD-10-CM | POA: Diagnosis not present

## 2023-07-07 DIAGNOSIS — Z1152 Encounter for screening for COVID-19: Secondary | ICD-10-CM | POA: Insufficient documentation

## 2023-07-07 DIAGNOSIS — Z7901 Long term (current) use of anticoagulants: Secondary | ICD-10-CM | POA: Insufficient documentation

## 2023-07-07 DIAGNOSIS — D649 Anemia, unspecified: Secondary | ICD-10-CM | POA: Insufficient documentation

## 2023-07-07 DIAGNOSIS — K122 Cellulitis and abscess of mouth: Secondary | ICD-10-CM | POA: Insufficient documentation

## 2023-07-07 DIAGNOSIS — R9431 Abnormal electrocardiogram [ECG] [EKG]: Secondary | ICD-10-CM | POA: Diagnosis not present

## 2023-07-07 DIAGNOSIS — Z79899 Other long term (current) drug therapy: Secondary | ICD-10-CM | POA: Insufficient documentation

## 2023-07-07 DIAGNOSIS — D631 Anemia in chronic kidney disease: Secondary | ICD-10-CM | POA: Diagnosis not present

## 2023-07-07 DIAGNOSIS — N2581 Secondary hyperparathyroidism of renal origin: Secondary | ICD-10-CM | POA: Diagnosis not present

## 2023-07-07 DIAGNOSIS — I700 Atherosclerosis of aorta: Secondary | ICD-10-CM | POA: Insufficient documentation

## 2023-07-07 DIAGNOSIS — I129 Hypertensive chronic kidney disease with stage 1 through stage 4 chronic kidney disease, or unspecified chronic kidney disease: Secondary | ICD-10-CM | POA: Diagnosis not present

## 2023-07-07 DIAGNOSIS — M4312 Spondylolisthesis, cervical region: Secondary | ICD-10-CM | POA: Diagnosis not present

## 2023-07-07 DIAGNOSIS — I120 Hypertensive chronic kidney disease with stage 5 chronic kidney disease or end stage renal disease: Secondary | ICD-10-CM | POA: Diagnosis not present

## 2023-07-07 DIAGNOSIS — Z8521 Personal history of malignant neoplasm of larynx: Secondary | ICD-10-CM | POA: Insufficient documentation

## 2023-07-07 DIAGNOSIS — N2889 Other specified disorders of kidney and ureter: Secondary | ICD-10-CM | POA: Diagnosis not present

## 2023-07-07 DIAGNOSIS — N185 Chronic kidney disease, stage 5: Secondary | ICD-10-CM | POA: Diagnosis not present

## 2023-07-07 LAB — COMPREHENSIVE METABOLIC PANEL
ALT: 10 U/L (ref 0–44)
AST: 14 U/L — ABNORMAL LOW (ref 15–41)
Albumin: 3.3 g/dL — ABNORMAL LOW (ref 3.5–5.0)
Alkaline Phosphatase: 63 U/L (ref 38–126)
Anion gap: 14 (ref 5–15)
BUN: 90 mg/dL — ABNORMAL HIGH (ref 8–23)
CO2: 18 mmol/L — ABNORMAL LOW (ref 22–32)
Calcium: 7.8 mg/dL — ABNORMAL LOW (ref 8.9–10.3)
Chloride: 106 mmol/L (ref 98–111)
Creatinine, Ser: 6.44 mg/dL — ABNORMAL HIGH (ref 0.44–1.00)
GFR, Estimated: 6 mL/min — ABNORMAL LOW (ref >60)
Glucose, Bld: 104 mg/dL — ABNORMAL HIGH (ref 70–99)
Potassium: 4.4 mmol/L (ref 3.5–5.1)
Sodium: 138 mmol/L (ref 135–145)
Total Bilirubin: 0.5 mg/dL (ref 0.3–1.2)
Total Protein: 6.9 g/dL (ref 6.5–8.1)

## 2023-07-07 LAB — CBC WITH DIFFERENTIAL/PLATELET
Abs Immature Granulocytes: 0.02 10*3/uL (ref 0.00–0.07)
Basophils Absolute: 0 10*3/uL (ref 0.0–0.1)
Basophils Relative: 0 %
Eosinophils Absolute: 0.1 10*3/uL (ref 0.0–0.5)
Eosinophils Relative: 1 %
HCT: 20.6 % — ABNORMAL LOW (ref 36.0–46.0)
Hemoglobin: 6.5 g/dL — CL (ref 12.0–15.0)
Immature Granulocytes: 0 %
Lymphocytes Relative: 14 %
Lymphs Abs: 0.7 10*3/uL (ref 0.7–4.0)
MCH: 28.5 pg (ref 26.0–34.0)
MCHC: 31.6 g/dL (ref 30.0–36.0)
MCV: 90.4 fL (ref 80.0–100.0)
Monocytes Absolute: 0.4 10*3/uL (ref 0.1–1.0)
Monocytes Relative: 9 %
Neutro Abs: 3.8 10*3/uL (ref 1.7–7.7)
Neutrophils Relative %: 76 %
Platelets: 169 10*3/uL (ref 150–400)
RBC: 2.28 MIL/uL — ABNORMAL LOW (ref 3.87–5.11)
RDW: 14 % (ref 11.5–15.5)
WBC: 5 10*3/uL (ref 4.0–10.5)
nRBC: 0 % (ref 0.0–0.2)

## 2023-07-07 LAB — TYPE AND SCREEN
ABO/RH(D): O POS
Unit division: 0

## 2023-07-07 LAB — APTT: aPTT: 34 seconds (ref 24–36)

## 2023-07-07 LAB — PROTIME-INR
INR: 1.5 — ABNORMAL HIGH (ref 0.8–1.2)
Prothrombin Time: 18 seconds — ABNORMAL HIGH (ref 11.4–15.2)

## 2023-07-07 LAB — GROUP A STREP BY PCR: Group A Strep by PCR: NOT DETECTED

## 2023-07-07 LAB — BPAM RBC

## 2023-07-07 LAB — SARS CORONAVIRUS 2 BY RT PCR: SARS Coronavirus 2 by RT PCR: NEGATIVE

## 2023-07-07 LAB — PREPARE RBC (CROSSMATCH)

## 2023-07-07 MED ORDER — ONDANSETRON HCL 4 MG/2ML IJ SOLN
4.0000 mg | Freq: Once | INTRAMUSCULAR | Status: AC
  Administered 2023-07-07: 4 mg via INTRAVENOUS
  Filled 2023-07-07: qty 2

## 2023-07-07 MED ORDER — CLINDAMYCIN HCL 300 MG PO CAPS: 300.0000 mg | ORAL_CAPSULE | Freq: Three times a day (TID) | ORAL | 0 refills | Status: DC

## 2023-07-07 MED ORDER — CLINDAMYCIN HCL 300 MG PO CAPS
300.0000 mg | ORAL_CAPSULE | Freq: Once | ORAL | Status: AC
  Administered 2023-07-07: 300 mg via ORAL
  Filled 2023-07-07: qty 1

## 2023-07-07 MED ORDER — SODIUM CHLORIDE 0.9% IV SOLUTION: Freq: Once | INTRAVENOUS | Status: AC

## 2023-07-07 NOTE — ED Provider Notes (Signed)
[place] EMERGENCY DEPARTMENT AT [facility] Provider Note   CSN: [id number] Arrival date & time: 07/07/23  1500     History  Chief Complaint  Patient presents with   low hemoglobin    [name] is a 77 y.o. female.  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  History is from the daughter.  Offered translator services but she declines.  Patient missed her most recent Retacrit injection due [place] the beach.  Over the past few days she has been more fatigued which is pretty typical whenever she gets anemia.  She has had to have transfusions in the past.  No dyspnea or chest pain.  No blood in the stool or black stools though she does have hemorrhoids and has noticed some blood when wiping only.  She is on Eliquis.  The patient also is endorsing some right-sided neck pain for couple days and the daughter states that she herself had strep when they went to the beach.  No fevers or new cough.  She always has limited mouth opening due to the radiation and previous cancer but no new limited mouth opening.  No neck swelling.  Home Medications Prior to Admission medications   Medication Sig Start Date End Date Taking? Authorizing Provider  clindamycin (CLEOCIN) 300 MG capsule Take 1 capsule (300 mg total) by mouth 3 (three) times daily for 10 days. 07/07/23 07/17/23 Yes Pricilla Loveless, MD  acetaminophen-codeine (TYLENOL #3) 300-30 MG tablet Take 1 tablet by mouth every 6 (six) hours as needed. 06/22/23   Guy Sandifer L, PA  aspirin EC 81 MG tablet Take 1 tablet (81 mg total) by mouth daily. Swallow whole. 10/31/22   Regalado, Belkys A, MD  Camphor-Menthol-Methyl Sal (SALONPAS EX) Apply 1 patch topically as needed (pain).    [provider]  carvedilol (COREG) 25 MG tablet TAKE 1 TABLET (25 MG  TOTAL) BY MOUTH TWICE A DAY WITH MEALS 06/18/23   Myrlene Broker, MD  cilostazol (PLETAL) 100 MG tablet TAKE 1 TABLET BY MOUTH TWICE A DAY Patient taking differently: Take 100 mg by mouth 2 (two) times daily. 01/18/23   Myrlene Broker, MD  cloNIDine (CATAPRES - DOSED IN MG/24 HR) 0.1 mg/24hr patch Place 1 patch (0.1 mg total) onto the skin once a week. 09/18/22   Dorcas Carrow, MD  cyclobenzaprine (FLEXERIL) 5 MG tablet TAKE 0.5 TABLETS (2.5 MG TOTAL) BY MOUTH AT BEDTIME AS NEEDED FOR MUSCLE SPASMS. Patient taking differently: Take 5 mg by mouth at bedtime. 01/18/23   Myrlene Broker, MD  diphenoxylate-atropine (LOMOTIL) 2.5-0.025 MG tablet TAKE 1 TABLET BY MOUTH DAILY AS NEEDED FOR DIARRHEA OR LOOSE STOOLS. 06/18/23   Crain, Whitney L, PA  DULoxetine (CYMBALTA) 30 MG capsule Take 1 capsule (30 mg total) by mouth daily. 11/17/22   Myrlene Broker, MD  ELIQUIS 2.5 MG TABS tablet TAKE 1 TABLET BY MOUTH TWICE A DAY 03/26/23   Myrlene Broker, MD  furosemide (LASIX) 40 MG tablet Take 2 tablets (80 mg total) by mouth 2 (two) times daily. For increase edema Patient taking differently: Take 40-80 mg by mouth 2 (two) times daily. For increase edema 03/10/23   Myrlene Broker, MD  guaiFENesin (MUCINEX) 600 MG 12 hr tablet Take 2 tablets (1,200 mg total) by mouth 2 (two) times daily. Patient not taking: Reported on 03/10/2023  10/31/22   Regalado, Belkys A, MD  hydrALAZINE (APRESOLINE) 100 MG tablet Take 1 pill 3 times a day (total dose 125 mg with 25 mg tablet) Patient taking differently: Take 100 mg by mouth 3 (three) times daily. Take 100 mg 3 times a day (total dose 125 mg with 25 mg tablet) 03/10/23   Myrlene Broker, MD  hydrALAZINE (APRESOLINE) 25 MG tablet Give 1 pill TID (1 of 100 mg and 1 of 25 mg tablets 3 times a day for total dose 125 mg TID) Hold if SBP less than 120 Patient taking differently: Take 25 mg by mouth 3 (three) times daily. Take 25 mg 3 times daily. Take  with 100 mg to equal 125 mg 03/10/23   Myrlene Broker, MD  ipratropium-albuterol (DUONEB) 0.5-2.5 (3) MG/3ML SOLN Take 3 mLs by nebulization 2 (two) times daily. Patient not taking: Reported on 03/10/2023 10/31/22   Hartley Barefoot A, MD  isosorbide mononitrate (IMDUR) 60 MG 24 hr tablet TAKE 1 TABLET BY MOUTH EVERY DAY 06/02/23   Myrlene Broker, MD  levothyroxine (SYNTHROID) 50 MCG tablet Take 1 tablet (50 mcg total) by mouth daily before breakfast. Additional refill from pcp 10/20/22   Nche, Bonna Gains, NP  LORazepam (ATIVAN) 1 MG tablet TAKE 1 TABLET BY MOUTH 2 TIMES DAILY AS NEEDED FOR ANXIETY. Patient taking differently: Take 1 mg by mouth 2 (two) times daily. 03/01/23   Myrlene Broker, MD  minoxidil (LONITEN) 2.5 MG tablet Take 1 tablet (2.5 mg total) by mouth 2 (two) times daily. 10/23/22   Myrlene Broker, MD  mirtazapine (REMERON) 15 MG tablet TAKE 1 TABLET BY MOUTH EVERYDAY AT BEDTIME Patient taking differently: Take 15 mg by mouth at bedtime. 03/22/23   Myrlene Broker, MD  nitroGLYCERIN (NITROSTAT) 0.4 MG SL tablet Place 1 tablet (0.4 mg total) under the tongue every 5 (five) minutes x 3 doses as needed for chest pain. 10/31/22   Regalado, Belkys A, MD  omeprazole (PRILOSEC) 40 MG capsule Take 1 capsule (40 mg total) by mouth daily. 03/10/23   Myrlene Broker, MD  ondansetron (ZOFRAN) 4 MG tablet TAKE 1 TABLET BY MOUTH EVERY 8 HOURS AS NEEDED FOR NAUSEA AND VOMITING 06/08/23   Myrlene Broker, MD  ondansetron (ZOFRAN) 40 MG/20ML SOLN injection INJECT 2 ML INTRAMUSCULARLY EVERY 6 HOURS AS NEEDED FOR NAUSEA OR VOMITING 07/05/23   Myrlene Broker, MD  rosuvastatin (CRESTOR) 20 MG tablet TAKE 1 TABLET BY MOUTH EVERY DAY 02/18/23   Myrlene Broker, MD  sodium bicarbonate 650 MG tablet Take 650 mg by mouth 3 (three) times daily. 10/01/22   [provider]  SYRINGE-NEEDLE, DISP, 3 ML (B-D INTEGRA SYRINGE) 23G X 1" 3 ML MISC Use with  zofran injections 03/10/23   Myrlene Broker, MD  levocetirizine (XYZAL) 5 MG tablet TAKE 1 TABLET(5 MG) BY MOUTH EVERY EVENING 02/21/20 06/10/20  Etta Grandchild, MD      Allergies    Xyzal [levocetirizine dihydrochloride], Augmentin [amoxicillin-pot clavulanate], and Tribenzor [olmesartan-amlodipine-hctz]    Review of Systems   Review of Systems  Constitutional:  Negative for fever.  HENT:  Positive for sore throat.   Respiratory:  Positive for cough (chronic, unchanged). Negative for shortness of breath.   Cardiovascular:  Negative for chest pain.  Gastrointestinal:  Negative for blood in stool.    Physical Exam Updated Vital Signs BP (!) 189/72   Pulse 83   Temp 97.9 F (36.6 C) (Axillary)  Resp (!) 21   Ht 5' (1.524 m)   Wt 49.9 kg   SpO2 96%   BMI 21.48 kg/m  Physical Exam Vitals and nursing note reviewed.  Constitutional:      Appearance: She is well-developed.  HENT:     Head: Normocephalic and atraumatic.     Mouth/Throat:     Comments: Limited mouth opening (chronic) limits oropharyngeal exam. No obvious swelling. Neck:     Comments: No swelling appreciated. Tracheostomy in place. No significant tenderness to anterior neck. Cardiovascular:     Rate and Rhythm: Normal rate and regular rhythm.     Heart sounds: Normal heart sounds.  Pulmonary:     Effort: Pulmonary effort is normal.     Breath sounds: Normal breath sounds. No wheezing, rhonchi or rales.  Chest:     Chest wall: No tenderness.  Abdominal:     General: There is no distension.  Skin:    General: Skin is warm and dry.  Neurological:     Mental Status: She is alert.     ED Results / Procedures / Treatments   Labs (all labs ordered are listed, but only abnormal results are displayed) Labs Reviewed  CBC WITH DIFFERENTIAL/PLATELET - Abnormal; Notable for the following components:      Result Value   RBC 2.28 (*)    Hemoglobin 6.5 (*)    HCT 20.6 (*)    All other components within  normal limits  COMPREHENSIVE METABOLIC PANEL - Abnormal; Notable for the following components:   CO2 18 (*)    Glucose, Bld 104 (*)    BUN 90 (*)    Creatinine, Ser 6.44 (*)    Calcium 7.8 (*)    Albumin 3.3 (*)    AST 14 (*)    GFR, Estimated 6 (*)    All other components within normal limits  PROTIME-INR - Abnormal; Notable for the following components:   Prothrombin Time 18.0 (*)    INR 1.5 (*)    All other components within normal limits  SARS CORONAVIRUS 2 BY RT PCR  GROUP A STREP BY PCR  APTT  TYPE AND SCREEN  PREPARE RBC (CROSSMATCH)    EKG EKG Interpretation Date/Time:  Wednesday July 07 2023 16:35:15 EDT Ventricular Rate:  76 PR Interval:  151 QRS Duration:  89 QT Interval:  464 QTC Calculation: 522 R Axis:   95  Text Interpretation: Sinus rhythm Probable left atrial enlargement Right axis deviation Probable anteroseptal infarct, old Borderline repolarization abnormality Prolonged QT interval no significant change since April 2024 Confirmed by Pricilla Loveless [phone]) on 07/07/2023 4:53:35 PM  Radiology CT Soft Tissue Neck Wo Contrast  Result Date: 07/07/2023 CLINICAL DATA:  Epiglottitis or tonsillitis suspected EXAM: CT NECK WITHOUT CONTRAST TECHNIQUE: Multidetector CT imaging of the neck was performed following the standard protocol without intravenous contrast. RADIATION DOSE REDUCTION: This exam was performed according to the departmental dose-optimization program which includes automated exposure control, adjustment of the mA and/or kV according to patient size and/or use of iterative reconstruction technique. COMPARISON:  CT neck dated 09/07/2022, CT Chest 08/06/22 FINDINGS: Pharynx and larynx: There are postsurgical changes from laryngectomy. The esophagus is surgically absent. There is TEP device in place. Compared to prior exam there is increased thickening of the uvula in the pharyngeal mucosa (series 8, image 68/series 4, image 41). There is persistent mild soft  tissue stranding in the Salivary glands: Bilateral parotid glands are normal in appearance. Bilateral submandibular glands are not visualized.  Thyroid: Normal thyroid tissue is not definitively visualized. Lymph nodes: No cervical lymphadenopathy. Vascular: Atherosclerotic plaque of the bilateral carotid bifurcations. Limited intracranial: Negative. Visualized orbits: Negative. Mastoids and visualized paranasal sinuses: mucosal thickening right sphenoid sinus. Skeleton: No acute or aggressive process. Grade 1 anterolisthesis of C3 on C4. Upper chest: Persistent biapical ground-glass opacities. There are multiple prominent prevascular lymph nodes measuring up to 10 mm (series 4, image 102). Other: None. IMPRESSION: 1. Compared to prior exam there is increased thickening of the uvula and pharyngeal mucosa. This is nonspecific, but could be seen in the setting of pharyngitis and or uvulitis. Recommend correlation with direct visualization. 2. Multiple prominent prevascular lymph nodes measuring up to 10 mm. These have increased in size compared to 08/06/2022. Recommend further evaluation with a dedicated chest CT. Aortic Atherosclerosis (ICD10-I70.0). Electronically Signed   By: Lorenza Cambridge M.D.   On: 07/07/2023 17:37    Procedures Procedures    Medications Ordered in ED Medications  0.9 %  sodium chloride infusion (Manually program via Guardrails IV Fluids) (0 mLs Intravenous Stopped 07/07/23 2114)  clindamycin (CLEOCIN) capsule 300 mg (300 mg Oral Given 07/07/23 2038)  ondansetron (ZOFRAN) injection 4 mg (4 mg Intravenous Given 07/07/23 2110)    ED Course/ Medical Decision Making/ A&P                             Medical Decision Making Amount and/or Complexity of Data Reviewed Labs: ordered.    Details: Hemoglobin 6.5.  Kidney function worse at 6.44. Radiology: ordered and independent interpretation performed.    Details: No obvious epiglottitis.  Risk Prescription drug  management.   Patient presents with symptomatic anemia.  She has had similar presentations and required transfusion.  She does not want to be on dialysis at any point and does not want to be admitted.  I do think is reasonable as this is a recurrent issue and she missed her injection that we could give her a unit of blood and discharge her.  She tolerated the blood transfusion well.  At 1 point it was thought she might have shortness of breath after the transfusion but it appeared that her trach is gotten a little clogged with some sputum.  She now feels well.  She is having the sore throat and a CT was obtained given her previous cancer and acute symptoms and that there is some thickening of the uvula and pharyngeal mucosa.  Given her comorbidities will cover with some antibiotics.  Otherwise, I did make her aware of the worsening renal function and she does not want to stay or get further workup.  Discussed the importance of following up closely with her kidney specialist.  Will discharge home with return precautions.        Final Clinical Impression(s) / ED Diagnoses Final diagnoses:  Symptomatic anemia  Acute kidney injury superimposed on chronic kidney disease (HCC)  Uvulitis    Rx / DC Orders ED Discharge Orders          Ordered    clindamycin (CLEOCIN) 300 MG capsule  3 times daily        07/07/23 2128              Pricilla Loveless, MD 07/07/23 2327

## 2023-07-07 NOTE — ED Notes (Signed)
Patient transported to CT 

## 2023-07-07 NOTE — ED Triage Notes (Signed)
Pt arrives for blood transfusion after routine blood work for follow up of her kidney disease. Pt reports having hemorrhoids and hx of anemia requiring transfusions.

## 2023-07-07 NOTE — Discharge Instructions (Addendum)
Your blood count was low today, and we gave you a blood transfusion.  Your kidney function is also worse than baseline and you need to call your kidney doctor tomorrow for urgent reevaluation as an outpatient this week.  CT scan shows that your uvula is inflamed, this is often from an infection so you are being put on antibiotics.  If she has new or worsening trouble breathing, new or worsening neck pain or throat pain, trouble breathing or swallowing, fever, or any other new/concerning symptoms then return to the ER or call 911.  Su recuento sanguneo fue bajo hoy y le hicimos una transfusin de [place].  Su funcin renal tambin es peor que la inicial y Designer, television/film set a su mdico especialista en riones maana para una reevaluacin urgente como paciente ambulatorio esta semana.  La tomografa computarizada muestra que su vula est inflamada, esto a menudo se debe a una infeccin, por lo que le recetan antibiticos.  Si tiene problemas para respirar nuevos o que empeoran, dolor de cuello o de garganta nuevo o que [place], problemas para respirar o tragar, fiebre o cualquier otro sntoma nuevo o preocupante, regrese a la sala de emergencias o llame al 911.

## 2023-07-08 ENCOUNTER — Ambulatory Visit: Payer: Medicare Other | Admitting: Internal Medicine

## 2023-07-08 LAB — BPAM RBC
Blood Product Expiration Date: 202408222359
ISSUE DATE / TIME: 202407241717
Unit Type and Rh: 5100

## 2023-07-08 LAB — TYPE AND SCREEN: Antibody Screen: NEGATIVE

## 2023-07-09 ENCOUNTER — Encounter (HOSPITAL_COMMUNITY): Payer: Medicare Other

## 2023-07-12 ENCOUNTER — Ambulatory Visit (INDEPENDENT_AMBULATORY_CARE_PROVIDER_SITE_OTHER): Payer: Medicare Other | Admitting: Family Medicine

## 2023-07-12 ENCOUNTER — Encounter: Payer: Self-pay | Admitting: Family Medicine

## 2023-07-12 VITALS — BP 156/62 | HR 82 | Temp 97.6°F | Resp 22 | Ht 60.0 in | Wt 107.0 lb

## 2023-07-12 DIAGNOSIS — D631 Anemia in chronic kidney disease: Secondary | ICD-10-CM

## 2023-07-12 DIAGNOSIS — N185 Chronic kidney disease, stage 5: Secondary | ICD-10-CM

## 2023-07-12 DIAGNOSIS — R1314 Dysphagia, pharyngoesophageal phase: Secondary | ICD-10-CM

## 2023-07-12 DIAGNOSIS — K122 Cellulitis and abscess of mouth: Secondary | ICD-10-CM | POA: Diagnosis not present

## 2023-07-12 DIAGNOSIS — R112 Nausea with vomiting, unspecified: Secondary | ICD-10-CM | POA: Diagnosis not present

## 2023-07-12 DIAGNOSIS — E440 Moderate protein-calorie malnutrition: Secondary | ICD-10-CM

## 2023-07-12 DIAGNOSIS — I10 Essential (primary) hypertension: Secondary | ICD-10-CM | POA: Diagnosis not present

## 2023-07-12 DIAGNOSIS — Z9889 Other specified postprocedural states: Secondary | ICD-10-CM

## 2023-07-12 MED ORDER — CEPHALEXIN 250 MG/5ML PO SUSR
500.0000 mg | Freq: Two times a day (BID) | ORAL | 0 refills | Status: AC
Start: 2023-07-12 — End: 2023-07-19

## 2023-07-12 MED ORDER — ONDANSETRON 8 MG PO TBDP
8.0000 mg | ORAL_TABLET | Freq: Three times a day (TID) | ORAL | 2 refills | Status: DC | PRN
Start: 2023-07-12 — End: 2023-10-08

## 2023-07-12 NOTE — Progress Notes (Signed)
Assessment & Plan:  1. Uvulitis D/C Clindamycin. Start liquid Keflex. - cephALEXin (KEFLEX) 250 MG/5ML suspension; Take 10 mLs (500 mg total) by mouth 2 (two) times daily for 7 days.  Dispense: 150 mL; Refill: 0  2. Anemia in stage 5 chronic kidney disease, not on chronic dialysis [facility]) Encouraged to keep follow-up appointment with infusion center this Friday.   3. Nausea and vomiting in adult Increased dose of Zofran from 4 mg to 8 mg and changed to disintegrating tablet since she is having trouble swallowing. - ondansetron (ZOFRAN-ODT) 8 MG disintegrating tablet; Take 1 tablet (8 mg total) by mouth every 8 (eight) hours as needed for nausea or vomiting.  Dispense: 20 tablet; Refill: 2  4-6. Dysphagia, pharyngoesophageal phase/History of head and neck cancer-s/p laryngectomy-with tracheostomy/Moderate protein-calorie malnutrition (HCC) Patient is requesting a feeding tube.  - Ambulatory referral to General Surgery  7. Primary hypertension Elevated, but better today than previous. Encouraged to monitor at home.    Follow up plan: Return if symptoms worsen or fail to improve.  Deliah Boston, MSN, APRN, FNP-C  Subjective:  HPI: [name] is a 77 y.o. female presenting on 07/12/2023 for Sore Throat (Swelling of throat after transfusion (blood) ordered by [facility] (wed) Sheliah Hatch was swollen - he gave clindamycin and it is too big to swallow - would like a liquid/ smaller tab )  Patient is accompanied by her daughter-in-law, Shanda Bumps, who she is okay with being present.  Patient declines a Nurse, learning disability.  She presents today requesting a different antibiotic.She was seen in the ER on 07/07/2023 for symptomatic anemia and uvulitis.  She was given a prescription for clindamycin which she has been unable to take due to its size and difficulty swallowing.  She has a follow-up appointment on Friday with the infusion center for her anemia.  She is requesting a higher dose of her oral  nausea medication.  Patient also reports she would like a feeding tube.  States she continues to lose weight as she is unable to eat due to difficulty swallowing status post throat cancer and tracheostomy.    ROS: Negative unless specifically indicated above in HPI.   Relevant past medical history reviewed and updated as indicated.   Allergies and medications reviewed and updated.   Current Outpatient Medications:    acetaminophen-codeine (TYLENOL #3) 300-30 MG tablet, Take 1 tablet by mouth every 6 (six) hours as needed., Disp: 28 tablet, Rfl: 2   aspirin EC 81 MG tablet, Take 1 tablet (81 mg total) by mouth daily. Swallow whole., Disp: 30 tablet, Rfl: 12   Camphor-Menthol-Methyl Sal (SALONPAS EX), Apply 1 patch topically as needed (pain)., Disp: , Rfl:    carvedilol (COREG) 25 MG tablet, TAKE 1 TABLET (25 MG TOTAL) BY MOUTH TWICE A DAY WITH MEALS, Disp: 180 tablet, Rfl: 1   cilostazol (PLETAL) 100 MG tablet, TAKE 1 TABLET BY MOUTH TWICE A DAY (Patient taking differently: Take 100 mg by mouth 2 (two) times daily.), Disp: 180 tablet, Rfl: 3   cloNIDine (CATAPRES - DOSED IN MG/24 HR) 0.1 mg/24hr patch, Place 1 patch (0.1 mg total) onto the skin once a week., Disp: 4 patch, Rfl: 12   cyclobenzaprine (FLEXERIL) 5 MG tablet, TAKE 0.5 TABLETS (2.5 MG TOTAL) BY MOUTH AT BEDTIME AS NEEDED FOR MUSCLE SPASMS. (Patient taking differently: Take 5 mg by mouth at bedtime.), Disp: 30 tablet, Rfl: 1   diphenoxylate-atropine (LOMOTIL) 2.5-0.025 MG tablet, TAKE 1 TABLET BY MOUTH DAILY AS NEEDED FOR DIARRHEA  OR LOOSE STOOLS., Disp: 30 tablet, Rfl: 0   DULoxetine (CYMBALTA) 30 MG capsule, Take 1 capsule (30 mg total) by mouth daily., Disp: 90 capsule, Rfl: 3   ELIQUIS 2.5 MG TABS tablet, TAKE 1 TABLET BY MOUTH TWICE A DAY, Disp: 180 tablet, Rfl: 3   furosemide (LASIX) 40 MG tablet, Take 2 tablets (80 mg total) by mouth 2 (two) times daily. For increase edema (Patient taking differently: Take 40-80 mg by mouth 2  (two) times daily. For increase edema), Disp: 360 tablet, Rfl: 3   guaiFENesin (MUCINEX) 600 MG 12 hr tablet, Take 2 tablets (1,200 mg total) by mouth 2 (two) times daily., Disp: 30 tablet, Rfl: 0   hydrALAZINE (APRESOLINE) 100 MG tablet, Take 1 pill 3 times a day (total dose 125 mg with 25 mg tablet) (Patient taking differently: Take 100 mg by mouth 3 (three) times daily. Take 100 mg 3 times a day (total dose 125 mg with 25 mg tablet)), Disp: 90 tablet, Rfl: 11   hydrALAZINE (APRESOLINE) 25 MG tablet, Give 1 pill TID (1 of 100 mg and 1 of 25 mg tablets 3 times a day for total dose 125 mg TID) Hold if SBP less than 120 (Patient taking differently: Take 25 mg by mouth 3 (three) times daily. Take 25 mg 3 times daily. Take with 100 mg to equal 125 mg), Disp: 90 tablet, Rfl: 11   ipratropium-albuterol (DUONEB) 0.5-2.5 (3) MG/3ML SOLN, Take 3 mLs by nebulization 2 (two) times daily., Disp: 360 mL, Rfl: 0   isosorbide mononitrate (IMDUR) 60 MG 24 hr tablet, TAKE 1 TABLET BY MOUTH EVERY DAY, Disp: 90 tablet, Rfl: 1   levothyroxine (SYNTHROID) 50 MCG tablet, Take 1 tablet (50 mcg total) by mouth daily before breakfast. Additional refill from pcp, Disp: 90 tablet, Rfl: 0   LORazepam (ATIVAN) 1 MG tablet, TAKE 1 TABLET BY MOUTH 2 TIMES DAILY AS NEEDED FOR ANXIETY. (Patient taking differently: Take 1 mg by mouth 2 (two) times daily.), Disp: 60 tablet, Rfl: 5   minoxidil (LONITEN) 2.5 MG tablet, Take 1 tablet (2.5 mg total) by mouth 2 (two) times daily., Disp: 180 tablet, Rfl: 3   mirtazapine (REMERON) 15 MG tablet, TAKE 1 TABLET BY MOUTH EVERYDAY AT BEDTIME (Patient taking differently: Take 15 mg by mouth at bedtime.), Disp: 90 tablet, Rfl: 1   nitroGLYCERIN (NITROSTAT) 0.4 MG SL tablet, Place 1 tablet (0.4 mg total) under the tongue every 5 (five) minutes x 3 doses as needed for chest pain., Disp: 10 tablet, Rfl: 1   omeprazole (PRILOSEC) 40 MG capsule, Take 1 capsule (40 mg total) by mouth daily., Disp: 90  capsule, Rfl: 3   ondansetron (ZOFRAN) 4 MG tablet, TAKE 1 TABLET BY MOUTH EVERY 8 HOURS AS NEEDED FOR NAUSEA AND VOMITING, Disp: 20 tablet, Rfl: 3   ondansetron (ZOFRAN) 40 MG/20ML SOLN injection, INJECT 2 ML INTRAMUSCULARLY EVERY 6 HOURS AS NEEDED FOR NAUSEA OR VOMITING, Disp: 40 mL, Rfl: 3   rosuvastatin (CRESTOR) 20 MG tablet, TAKE 1 TABLET BY MOUTH EVERY DAY, Disp: 90 tablet, Rfl: 3   sodium bicarbonate 650 MG tablet, Take 650 mg by mouth 3 (three) times daily., Disp: , Rfl:    SYRINGE-NEEDLE, DISP, 3 ML (B-D INTEGRA SYRINGE) 23G X 1" 3 ML MISC, Use with zofran injections, Disp: 50 each, Rfl: 3   clindamycin (CLEOCIN) 300 MG capsule, Take 1 capsule (300 mg total) by mouth 3 (three) times daily for 10 days. (Patient not taking: Reported on 07/12/2023),  Disp: 29 capsule, Rfl: 0  Allergies  Allergen Reactions   Xyzal [Levocetirizine Dihydrochloride] Itching   Augmentin [Amoxicillin-Pot Clavulanate] Diarrhea and Nausea And Vomiting   Tribenzor [Olmesartan-Amlodipine-Hctz] Other (See Comments)    "Hurt the kidneys"    Objective:   BP (!) 156/62   Pulse 82   Temp 97.6 F (36.4 C)   Resp (!) 22   Ht 5' (1.524 m)   Wt 107 lb (48.5 kg)   BMI 20.90 kg/m    Physical Exam Vitals reviewed.  Constitutional:      General: She is not in acute distress.    Appearance: Normal appearance. She is not ill-appearing, toxic-appearing or diaphoretic.  HENT:     Head: Normocephalic and atraumatic.     Mouth/Throat:     Comments: Unable to visualize throat or uvula as patient is unable to open her mouth that wide. Eyes:     General: No scleral icterus.       Right eye: No discharge.        Left eye: No discharge.     Conjunctiva/sclera: Conjunctivae normal.  Cardiovascular:     Rate and Rhythm: Normal rate and regular rhythm.     Heart sounds: Normal heart sounds. No murmur heard.    No friction rub. No gallop.  Pulmonary:     Effort: Pulmonary effort is normal. No respiratory distress.      Breath sounds: Normal breath sounds. No stridor. No wheezing, rhonchi or rales.  Musculoskeletal:        General: Normal range of motion.     Cervical back: Normal range of motion.  Lymphadenopathy:     Cervical: No cervical adenopathy.  Skin:    General: Skin is warm and dry.     Capillary Refill: Capillary refill takes less than 2 seconds.  Neurological:     General: No focal deficit present.     Mental Status: She is alert and oriented to person, place, and time. Mental status is at baseline.  Psychiatric:        Mood and Affect: Mood normal.        Behavior: Behavior normal.        Thought Content: Thought content normal.        Judgment: Judgment normal.

## 2023-07-14 ENCOUNTER — Encounter (HOSPITAL_COMMUNITY): Payer: Medicare Other

## 2023-07-15 ENCOUNTER — Encounter (HOSPITAL_COMMUNITY)
Admission: RE | Admit: 2023-07-15 | Discharge: 2023-07-15 | Disposition: A | Discharge status: Home / Self Care | Payer: Medicare Other | Source: Ambulatory Visit | Attending: Nephrology | Admitting: Nephrology

## 2023-07-15 VITALS — BP 138/52 | HR 48 | Temp 97.6°F | Resp 20

## 2023-07-15 DIAGNOSIS — D631 Anemia in chronic kidney disease: Secondary | ICD-10-CM | POA: Insufficient documentation

## 2023-07-15 DIAGNOSIS — N185 Chronic kidney disease, stage 5: Secondary | ICD-10-CM | POA: Diagnosis not present

## 2023-07-15 LAB — IRON AND TIBC
Iron: 44 ug/dL (ref 28–170)
Saturation Ratios: 22 % (ref 10.4–31.8)
TIBC: 199 ug/dL — ABNORMAL LOW (ref 250–450)
UIBC: 155 ug/dL

## 2023-07-15 LAB — FERRITIN: Ferritin: 727 ng/mL — ABNORMAL HIGH (ref 11–307)

## 2023-07-15 MED ORDER — EPOETIN ALFA-EPBX 10000 UNIT/ML IJ SOLN
INTRAMUSCULAR | Status: AC
  Filled 2023-07-15: qty 1

## 2023-07-15 MED ORDER — EPOETIN ALFA-EPBX 10000 UNIT/ML IJ SOLN: 5000.0000 [IU] | INTRAMUSCULAR | Status: DC

## 2023-07-15 MED ORDER — EPOETIN ALFA-EPBX 10000 UNIT/ML IJ SOLN
8000.0000 [IU] | Freq: Once | INTRAMUSCULAR | Status: AC
  Administered 2023-07-15: 8000 [IU] via SUBCUTANEOUS

## 2023-07-21 ENCOUNTER — Telehealth: Payer: Self-pay | Admitting: Internal Medicine

## 2023-07-21 NOTE — Telephone Encounter (Signed)
Patient saw Deliah Boston on 07/12/2023 and was prescribed an antibiotic. Patient's family member Shanda Bumps called and said it does not seem to be helping at all. She said the patient is not eating and she is very concerned. Shanda Bumps would like to know what their provider would recommend. Best callback is [phone].

## 2023-07-22 NOTE — Telephone Encounter (Signed)
I suspect she is getting worse overall, I recommend virtual visit to discuss her overall health and prognosis with PCP today or tomorrow

## 2023-07-22 NOTE — Telephone Encounter (Signed)
LVM for patient to give [place] a call back and schedule a Virtual visit asap per provider request

## 2023-07-23 ENCOUNTER — Other Ambulatory Visit: Payer: Self-pay | Admitting: Internal Medicine

## 2023-07-24 ENCOUNTER — Other Ambulatory Visit: Payer: Self-pay | Admitting: Internal Medicine

## 2023-07-24 DIAGNOSIS — E038 Other specified hypothyroidism: Secondary | ICD-10-CM

## 2023-07-27 ENCOUNTER — Other Ambulatory Visit: Payer: Self-pay | Admitting: Internal Medicine

## 2023-07-27 MED ORDER — CLINDAMYCIN PALMITATE HCL 75 MG/5ML PO SOLR: 300.0000 mg | Freq: Three times a day (TID) | ORAL | 0 refills | Status: AC

## 2023-07-29 ENCOUNTER — Encounter (HOSPITAL_COMMUNITY)
Admission: RE | Admit: 2023-07-29 | Discharge: 2023-07-29 | Disposition: A | Discharge status: Home / Self Care | Payer: Medicare Other | Source: Ambulatory Visit | Attending: Nephrology | Admitting: Nephrology

## 2023-07-29 VITALS — BP 175/75 | HR 79 | Temp 97.9°F | Resp 18

## 2023-07-29 DIAGNOSIS — D631 Anemia in chronic kidney disease: Secondary | ICD-10-CM | POA: Diagnosis not present

## 2023-07-29 DIAGNOSIS — N185 Chronic kidney disease, stage 5: Secondary | ICD-10-CM | POA: Diagnosis not present

## 2023-07-29 MED ORDER — EPOETIN ALFA-EPBX 10000 UNIT/ML IJ SOLN
INTRAMUSCULAR | Status: AC
  Filled 2023-07-29: qty 1

## 2023-07-29 MED ORDER — EPOETIN ALFA-EPBX 10000 UNIT/ML IJ SOLN
8000.0000 [IU] | INTRAMUSCULAR | Status: DC
  Administered 2023-07-29: 8000 [IU] via SUBCUTANEOUS

## 2023-07-29 NOTE — Progress Notes (Signed)
Spoke with [place] with [facility] re: HGB 7.3, patient asymptomatic. Per Dr. Ella Jubilee patient should continue with 8000 U retacrit.

## 2023-07-30 LAB — POCT HEMOGLOBIN-HEMACUE: Hemoglobin: 7.3 g/dL — ABNORMAL LOW (ref 12.0–15.0)

## 2023-08-04 DIAGNOSIS — R110 Nausea: Secondary | ICD-10-CM | POA: Diagnosis not present

## 2023-08-04 DIAGNOSIS — N185 Chronic kidney disease, stage 5: Secondary | ICD-10-CM | POA: Diagnosis not present

## 2023-08-04 DIAGNOSIS — E46 Unspecified protein-calorie malnutrition: Secondary | ICD-10-CM | POA: Diagnosis not present

## 2023-08-04 DIAGNOSIS — Z930 Tracheostomy status: Secondary | ICD-10-CM | POA: Diagnosis not present

## 2023-08-04 DIAGNOSIS — K222 Esophageal obstruction: Secondary | ICD-10-CM | POA: Diagnosis not present

## 2023-08-12 ENCOUNTER — Ambulatory Visit (HOSPITAL_COMMUNITY)
Admission: RE | Admit: 2023-08-12 | Discharge: 2023-08-12 | Disposition: A | Discharge status: Home / Self Care | Payer: Medicare Other | Source: Ambulatory Visit | Attending: Nephrology | Admitting: Nephrology

## 2023-08-12 ENCOUNTER — Telehealth: Payer: Self-pay | Admitting: Internal Medicine

## 2023-08-12 ENCOUNTER — Other Ambulatory Visit: Payer: Self-pay

## 2023-08-12 ENCOUNTER — Other Ambulatory Visit: Payer: Self-pay | Admitting: Internal Medicine

## 2023-08-12 VITALS — BP 171/69 | HR 86 | Temp 97.4°F | Resp 18

## 2023-08-12 DIAGNOSIS — D631 Anemia in chronic kidney disease: Secondary | ICD-10-CM | POA: Diagnosis not present

## 2023-08-12 DIAGNOSIS — N2581 Secondary hyperparathyroidism of renal origin: Secondary | ICD-10-CM | POA: Diagnosis not present

## 2023-08-12 DIAGNOSIS — I120 Hypertensive chronic kidney disease with stage 5 chronic kidney disease or end stage renal disease: Secondary | ICD-10-CM | POA: Diagnosis not present

## 2023-08-12 DIAGNOSIS — N185 Chronic kidney disease, stage 5: Secondary | ICD-10-CM | POA: Diagnosis not present

## 2023-08-12 LAB — POCT HEMOGLOBIN-HEMACUE: Hemoglobin: 7.5 g/dL — ABNORMAL LOW (ref 12.0–15.0)

## 2023-08-12 MED ORDER — EPOETIN ALFA-EPBX 10000 UNIT/ML IJ SOLN: 10000.0000 [IU] | Freq: Once | INTRAMUSCULAR | Status: AC

## 2023-08-12 MED ORDER — EPOETIN ALFA-EPBX 10000 UNIT/ML IJ SOLN: 8000.0000 [IU] | INTRAMUSCULAR | Status: DC

## 2023-08-12 MED ORDER — EPOETIN ALFA-EPBX 10000 UNIT/ML IJ SOLN
INTRAMUSCULAR | Status: AC
  Administered 2023-08-12: 10000 [IU] via SUBCUTANEOUS
  Filled 2023-08-12: qty 1

## 2023-08-12 NOTE — Telephone Encounter (Signed)
This medication is requesting a alternative due to insurance

## 2023-08-12 NOTE — Telephone Encounter (Signed)
Prescription Request  08/12/2023  LOV: 03/10/2023  What is the name of the medication or equipment? ondansetron (ZOFRAN) 40 MG/20ML SOLN injection   Have you contacted your pharmacy to request a refill? No   Which pharmacy would you like this sent to?  [facility] [phone] Ginette Otto, [place] - [address] [place] [address] Phone: [phone] Fax: [phone]    Patient notified that their request is being sent to the clinical staff for review and that they should receive a response within 2 business days.   Please advise at Mobile [phone] (mobile)

## 2023-08-13 ENCOUNTER — Other Ambulatory Visit: Payer: Self-pay

## 2023-08-13 MED ORDER — ONDANSETRON HCL 40 MG/20ML IJ SOLN: INTRAMUSCULAR | 3 refills | Status: DC

## 2023-08-13 NOTE — Telephone Encounter (Signed)
Sent in

## 2023-08-13 NOTE — Telephone Encounter (Signed)
No appropriate alternative if cost prohibitive let me know

## 2023-08-17 LAB — BASIC METABOLIC PANEL
BUN: 94 — AB (ref 4–21)
CO2: 22 (ref 13–22)
Chloride: 100 (ref 99–108)
Creatinine: 7.6 — AB (ref 0.5–1.1)
Glucose: 89
Potassium: 5.5 mEq/L — AB (ref 3.5–5.1)
Sodium: 136 — AB (ref 137–147)

## 2023-08-17 LAB — COMPREHENSIVE METABOLIC PANEL
Calcium: 8.9 (ref 8.7–10.7)
eGFR: 5

## 2023-08-17 LAB — PROTEIN / CREATININE RATIO, URINE: Albumin, U: 4.2

## 2023-08-18 ENCOUNTER — Encounter: Payer: Self-pay | Admitting: Family Medicine

## 2023-08-18 ENCOUNTER — Telehealth (INDEPENDENT_AMBULATORY_CARE_PROVIDER_SITE_OTHER): Payer: Medicare Other | Admitting: Family Medicine

## 2023-08-18 DIAGNOSIS — N185 Chronic kidney disease, stage 5: Secondary | ICD-10-CM

## 2023-08-18 DIAGNOSIS — J449 Chronic obstructive pulmonary disease, unspecified: Secondary | ICD-10-CM

## 2023-08-18 DIAGNOSIS — I5032 Chronic diastolic (congestive) heart failure: Secondary | ICD-10-CM

## 2023-08-18 DIAGNOSIS — Z9889 Other specified postprocedural states: Secondary | ICD-10-CM | POA: Diagnosis not present

## 2023-08-18 DIAGNOSIS — R051 Acute cough: Secondary | ICD-10-CM

## 2023-08-18 NOTE — Progress Notes (Signed)
MyChart Video Visit    Virtual Visit via Video Note    Patient location: Home. Patient and provider in visit Provider location: Office  I discussed the limitations of evaluation and management by telemedicine and the availability of in person appointments. The patient expressed understanding and agreed to proceed.  Visit Date: 08/18/2023  Today's healthcare provider: Hetty Blend, NP-C     Subjective:    Patient ID: [name], female    DOB: 06-21-1946, 77 y.o.   MRN: [id number]  Chief Complaint  Patient presents with   Cough    HPI  [name] daughter in law is interpreting. Patient is laying in bed.   C/o a 2-3 day hx of cough productive of green sputum and fatigue.   No fever, chills, shortness of breath or LE edema.   She has a trachea stoma.     Past Medical History:  Diagnosis Date   Anemia    Anxiety    takes Ativan prn   Blood transfusion without reported diagnosis 09/15/12   2 units Prbc's   Broken ribs    Chronic back pain    CKD (chronic kidney disease), stage IV (HCC)    Constipation    related to pain meds   COPD (chronic obstructive pulmonary disease) (HCC) 08/10/2012   denies   Depression    Gastrostomy in place [facility])    removed   GERD (gastroesophageal reflux disease)    takes Zantac daily   Headache(784.0)    Hiatal hernia 08/10/2012   History of radiation therapy 10/17/12-11/25/12   supraglottic larynx,high risk neck tumor bed 5880 cGy/28 sessions, high risk lymph node tumor bed 5600 cGy/20 sessions, mod risk lymph node tumor bed 5040 cGy/20 sessions   Hypercholesteremia    takes Pravastatin daily   Hypertension    takes Tribenzor and Atenolol daily   Insomnia    takes Amitriptyline daily   Nausea    takes Zofran prn   PAD (peripheral artery disease) (HCC)    noninvasive imaging in 2016   Pneumonia    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 no residual   Uterine cancer (HCC)     Past Surgical History:  Procedure Laterality Date   ABDOMINAL SURGERY     r/t uterine carcinoma   APPENDECTOMY     DIRECT LARYNGOSCOPY N/A 05/22/2014   Procedure: DIRECT LARYNGOSCOPY WITH ESOPHAGEAL DILATION;  Surgeon: Osborn Coho, MD;  Location[facility] OR;  Service: ENT;  Laterality: N/A;   ESOPHAGOSCOPY WITH DILITATION N/A 05/29/2015   Procedure: ESOPHAGOSCOPY WITH ESOPHAGEAL DILITATION;  Surgeon: Osborn Coho, MD;  Location: [facility] OR;  Service: ENT;  Laterality: N/A;   Gastrostomy Tube removed   2013   GASTROSTOMY W/ FEEDING TUBE  13   HERNIA REPAIR     child   LARYNGETOMY  08/31/2012   Procedure: LARYNGECTOMY;  Surgeon: Osborn Coho, MD;  Location: [facility] OR;  Service: ENT;  Laterality: N/A;   LARYNGOSCOPY  08/10/2012   Procedure: LARYNGOSCOPY;  Surgeon: Osborn Coho, MD;  Location: [facility] ORS;  Service: ENT;  Laterality: N/A;  with biopsy   RADICAL NECK DISSECTION  08/31/2012   Procedure: RADICAL NECK DISSECTION;  Surgeon: Osborn Coho, MD;  Location: [facility] OR;  Service: ENT;  Laterality: Bilateral;   TRACHEAL ESOPHOGEAL PUNCTURE WITH REPAIR STOMA N/A 09/08/2013   Procedure: TRACHEAL ESOPHOGEAL PUNCTURE WITH PLACEMENT OF  PROVOX PROSTHESIS ;  Surgeon: Osborn Coho, MD;  Location: [facility] OR;  Service: ENT;  Laterality: N/A;   TRACHEOSTOMY TUBE PLACEMENT  08/10/2012   Procedure: TRACHEOSTOMY;  Surgeon: Osborn Coho, MD;  Location: [facility] ORS;  Service: ENT;  Laterality: N/A;    Family History  Problem Relation Age of Onset   Throat cancer Father    Brain cancer Brother    CAD Neg Hx     Social History   Socioeconomic History   Marital status: Widowed    Spouse name: Not on file   Number of children: 4   Years of education: Not on file   Highest education level: Not on file  Occupational History    Comment: retired Sports administrator  Tobacco Use   Smoking status: Former    Current packs/day: 0.00    Average packs/day: 0.3 packs/day  for 50.0 years (12.5 ttl pk-yrs)    Types: Cigarettes    Start date: 05/28/1963    Quit date: 05/27/2013    Years since quitting: 10.2    Passive exposure: Past   Smokeless tobacco: Never  Vaping Use   Vaping status: Never Used  Substance and Sexual Activity   Alcohol use: Not Currently   Drug use: No   Sexual activity: Not Currently  Other Topics Concern   Not on file  Social History Narrative   ** Merged History Encounter **       Social Determinants of Health   Financial Resource Strain: Low Risk  (10/23/2022)   Overall Financial Resource Strain (CARDIA)    Difficulty of Paying Living Expenses: Not hard at all  Food Insecurity: No Food Insecurity (04/06/2023)   Hunger Vital Sign    Worried About Running Out of Food in the Last Year: Never true    Ran Out of Food in the Last Year: Never true  Transportation Needs: No Transportation Needs (04/06/2023)   PRAPARE - Administrator, Civil Service (Medical): No    Lack of Transportation (Non-Medical): No  Physical Activity: Inactive (10/23/2022)   Exercise Vital Sign    Days of Exercise per Week: 0 days    Minutes of Exercise per Session: 0 min  Stress: No Stress Concern Present (10/23/2022)   [employer] of Occupational Health - Occupational Stress Questionnaire    Feeling of Stress : Not at all  Social Connections: Socially Isolated (10/23/2022)   Social Connection and Isolation Panel [NHANES]    Frequency of Communication with Friends and Family: Once a week    Frequency of Social Gatherings with Friends and Family: Once a week    Attends Religious Services: More than 4 times per year    Active Member of [employer] or Organizations: No    Attends Banker Meetings: Never    Marital Status: Widowed  Intimate Partner Violence: Not At Risk (04/06/2023)   Humiliation, Afraid, Rape, and Kick questionnaire    Fear of Current or Ex-Partner: No    Emotionally Abused: No    Physically Abused: No     Sexually Abused: No    Outpatient Medications Prior to Visit  Medication Sig Dispense Refill   acetaminophen-codeine (TYLENOL #3) 300-30 MG tablet TAKE 1 TABLET BY MOUTH EVERY 6 HOURS AS NEEDED 28 tablet 2   aspirin EC 81 MG tablet Take 1 tablet (81 mg total) by mouth daily. Swallow whole. 30 tablet 12   Camphor-Menthol-Methyl Sal (SALONPAS EX) Apply 1 patch topically as needed (pain).     carvedilol (COREG) 25 MG tablet  TAKE 1 TABLET (25 MG TOTAL) BY MOUTH TWICE A DAY WITH MEALS 180 tablet 1   cilostazol (PLETAL) 100 MG tablet TAKE 1 TABLET BY MOUTH TWICE A DAY (Patient taking differently: Take 100 mg by mouth 2 (two) times daily.) 180 tablet 3   cloNIDine (CATAPRES - DOSED IN MG/24 HR) 0.1 mg/24hr patch Place 1 patch (0.1 mg total) onto the skin once a week. 4 patch 12   cyclobenzaprine (FLEXERIL) 5 MG tablet TAKE 0.5 TABLETS (2.5 MG TOTAL) BY MOUTH AT BEDTIME AS NEEDED FOR MUSCLE SPASMS. (Patient taking differently: Take 5 mg by mouth at bedtime.) 30 tablet 1   diphenoxylate-atropine (LOMOTIL) 2.5-0.025 MG tablet TAKE 1 TABLET BY MOUTH DAILY AS NEEDED FOR DIARRHEA OR LOOSE STOOLS. 30 tablet 0   DULoxetine (CYMBALTA) 30 MG capsule Take 1 capsule (30 mg total) by mouth daily. 90 capsule 3   ELIQUIS 2.5 MG TABS tablet TAKE 1 TABLET BY MOUTH TWICE A DAY 180 tablet 3   furosemide (LASIX) 40 MG tablet Take 2 tablets (80 mg total) by mouth 2 (two) times daily. For increase edema (Patient taking differently: Take 40-80 mg by mouth 2 (two) times daily. For increase edema) 360 tablet 3   guaiFENesin (MUCINEX) 600 MG 12 hr tablet Take 2 tablets (1,200 mg total) by mouth 2 (two) times daily. 30 tablet 0   hydrALAZINE (APRESOLINE) 100 MG tablet Take 1 pill 3 times a day (total dose 125 mg with 25 mg tablet) (Patient taking differently: Take 100 mg by mouth 3 (three) times daily. Take 100 mg 3 times a day (total dose 125 mg with 25 mg tablet)) 90 tablet 11   hydrALAZINE (APRESOLINE) 25 MG tablet Give 1 pill  TID (1 of 100 mg and 1 of 25 mg tablets 3 times a day for total dose 125 mg TID) Hold if SBP less than 120 (Patient taking differently: Take 25 mg by mouth 3 (three) times daily. Take 25 mg 3 times daily. Take with 100 mg to equal 125 mg) 90 tablet 11   ipratropium-albuterol (DUONEB) 0.5-2.5 (3) MG/3ML SOLN Take 3 mLs by nebulization 2 (two) times daily. 360 mL 0   isosorbide mononitrate (IMDUR) 60 MG 24 hr tablet TAKE 1 TABLET BY MOUTH EVERY DAY 90 tablet 1   levothyroxine (SYNTHROID) 50 MCG tablet TAKE 1 TABLET BY MOUTH EVERY DAY BEFORE BREAKFAST 90 tablet 0   LORazepam (ATIVAN) 1 MG tablet TAKE 1 TABLET BY MOUTH 2 TIMES DAILY AS NEEDED FOR ANXIETY. (Patient taking differently: Take 1 mg by mouth 2 (two) times daily.) 60 tablet 5   minoxidil (LONITEN) 2.5 MG tablet Take 1 tablet (2.5 mg total) by mouth 2 (two) times daily. 180 tablet 3   mirtazapine (REMERON) 15 MG tablet TAKE 1 TABLET BY MOUTH EVERYDAY AT BEDTIME (Patient taking differently: Take 15 mg by mouth at bedtime.) 90 tablet 1   nitroGLYCERIN (NITROSTAT) 0.4 MG SL tablet Place 1 tablet (0.4 mg total) under the tongue every 5 (five) minutes x 3 doses as needed for chest pain. 10 tablet 1   omeprazole (PRILOSEC) 40 MG capsule Take 1 capsule (40 mg total) by mouth daily. 90 capsule 3   ondansetron (ZOFRAN) 40 MG/20ML SOLN injection Inject 2 mL intramuscularly every 6 hours as needed for nausea or vomiting 40 mL 3   ondansetron (ZOFRAN-ODT) 8 MG disintegrating tablet Take 1 tablet (8 mg total) by mouth every 8 (eight) hours as needed for nausea or vomiting. 20 tablet 2  rosuvastatin (CRESTOR) 20 MG tablet TAKE 1 TABLET BY MOUTH EVERY DAY 90 tablet 3   sodium bicarbonate 650 MG tablet Take 650 mg by mouth 3 (three) times daily.     SYRINGE-NEEDLE, DISP, 3 ML (B-D INTEGRA SYRINGE) 23G X 1" 3 ML MISC Use with zofran injections 50 each 3   No facility-administered medications prior to visit.    Allergies  Allergen Reactions   Xyzal  [Levocetirizine Dihydrochloride] Itching   Augmentin [Amoxicillin-Pot Clavulanate] Diarrhea and Nausea And Vomiting   Tribenzor [Olmesartan-Amlodipine-Hctz] Other (See Comments)    "Hurt the kidneys"    ROS     Objective:    Physical Exam Constitutional:      General: She is not in acute distress.    Appearance: She is ill-appearing.  Pulmonary:     Effort: Pulmonary effort is normal.     There were no vitals taken for this visit. Wt Readings from Last 3 Encounters:  07/12/23 107 lb (48.5 kg)  07/07/23 110 lb (49.9 kg)  06/04/23 105 lb 13.1 oz (48 kg)       Assessment & Plan:   Problem List Items Addressed This Visit       Cardiovascular and Mediastinum   Chronic diastolic (congestive) heart failure (HCC)     Respiratory   COPD (chronic obstructive pulmonary disease) (HCC)     Genitourinary   CKD (chronic kidney disease) stage 5, GFR less than 15 ml/min (HCC)     Other   History of head and neck cancer-s/p laryngectomy-with tracheostomy   Other Visit Diagnoses     Acute cough    -  Primary      No acute distress. Daughter in law interpreting and helps care for patient. Recommend checking Covid test at home. Most likely viral illness. Recommend taking guaifenesin and follow up if not improving in the next 1-2 days.   I am having Gabriel Cirri maintain her cloNIDine, sodium bicarbonate, minoxidil, aspirin EC, guaiFENesin, ipratropium-albuterol, nitroGLYCERIN, DULoxetine, cilostazol, cyclobenzaprine, rosuvastatin, LORazepam, B-D INTEGRA SYRINGE, omeprazole, furosemide, hydrALAZINE, hydrALAZINE, mirtazapine, Eliquis, Camphor-Menthol-Methyl Sal (SALONPAS EX), isosorbide mononitrate, diphenoxylate-atropine, carvedilol, ondansetron, acetaminophen-codeine, levothyroxine, and ondansetron.  No orders of the defined types were placed in this encounter.   I discussed the assessment and treatment plan with the patient. The patient was provided an opportunity to ask  questions and all were answered. The patient agreed with the plan and demonstrated an understanding of the instructions.   The patient was advised to call back or seek an in-person evaluation if the symptoms worsen or if the condition fails to improve as anticipated.     Hetty Blend, NP-C [facility] [phone] (phone) [phone] (fax)  [facility]

## 2023-08-20 ENCOUNTER — Other Ambulatory Visit: Payer: Self-pay | Admitting: Family Medicine

## 2023-08-20 ENCOUNTER — Telehealth: Payer: Self-pay | Admitting: Internal Medicine

## 2023-08-20 MED ORDER — CEFPODOXIME PROXETIL 200 MG PO TABS: 200.0000 mg | ORAL_TABLET | Freq: Every day | ORAL | 0 refills | Status: DC

## 2023-08-20 NOTE — Telephone Encounter (Signed)
Patient saw Hetty Blend on 08/18/23 for a virtual visit. Patient was prescribed cough medicine to help. They were told to call back if she wasn't doing better. Her daughter called and said she is not doing better.  She would like a call back at [phone].

## 2023-08-20 NOTE — Telephone Encounter (Signed)
Called and spoke w daughter who reports pt does not have any new symptoms or SOB and doesn't feel like she needs to go to ED as she can usually tell when she is having troubles and pt will voice her concerns. Daughter thinks it is just time for an antibiotic if Larene Beach thinks it is appropriate

## 2023-08-20 NOTE — Telephone Encounter (Signed)
Please advise 

## 2023-08-26 ENCOUNTER — Encounter (HOSPITAL_COMMUNITY)
Admission: RE | Admit: 2023-08-26 | Discharge: 2023-08-26 | Disposition: A | Discharge status: Home / Self Care | Payer: Medicare Other | Source: Ambulatory Visit | Attending: Nephrology | Admitting: Nephrology

## 2023-08-26 VITALS — BP 164/60 | HR 81 | Temp 97.2°F | Resp 18

## 2023-08-26 DIAGNOSIS — D649 Anemia, unspecified: Secondary | ICD-10-CM | POA: Insufficient documentation

## 2023-08-26 DIAGNOSIS — D631 Anemia in chronic kidney disease: Secondary | ICD-10-CM | POA: Insufficient documentation

## 2023-08-26 DIAGNOSIS — N189 Chronic kidney disease, unspecified: Secondary | ICD-10-CM | POA: Insufficient documentation

## 2023-08-26 DIAGNOSIS — N185 Chronic kidney disease, stage 5: Secondary | ICD-10-CM | POA: Insufficient documentation

## 2023-08-26 LAB — POCT HEMOGLOBIN-HEMACUE: Hemoglobin: 6.8 g/dL — CL (ref 12.0–15.0)

## 2023-08-26 LAB — IRON AND TIBC
Iron: 60 ug/dL (ref 28–170)
Saturation Ratios: 26 % (ref 10.4–31.8)
TIBC: 228 ug/dL — ABNORMAL LOW (ref 250–450)
UIBC: 168 ug/dL

## 2023-08-26 LAB — PREPARE RBC (CROSSMATCH)

## 2023-08-26 LAB — FERRITIN: Ferritin: 821 ng/mL — ABNORMAL HIGH (ref 11–307)

## 2023-08-26 MED ORDER — EPOETIN ALFA-EPBX 10000 UNIT/ML IJ SOLN: 10000.0000 [IU] | INTRAMUSCULAR | Status: DC

## 2023-08-26 MED ORDER — EPOETIN ALFA-EPBX 10000 UNIT/ML IJ SOLN
20000.0000 [IU] | Freq: Once | INTRAMUSCULAR | Status: AC
  Administered 2023-08-26: 20000 [IU] via SUBCUTANEOUS

## 2023-08-26 MED ORDER — EPOETIN ALFA-EPBX 10000 UNIT/ML IJ SOLN
INTRAMUSCULAR | Status: AC
  Filled 2023-08-26: qty 1

## 2023-08-26 NOTE — Progress Notes (Signed)
Pt here for retacrit injection.  POC HGB 6.6, repeated and showed 6.8.  Pt with no S/S of bleeding, shortness of breath or chest pain.     Dr. Doristine Church office notified.  Orders received to increase Retacrit dosage and to schedule pt for 1 unit of PRBC's.  Discussed all with pt and daughter

## 2023-08-27 ENCOUNTER — Encounter (HOSPITAL_COMMUNITY)
Admission: RE | Admit: 2023-08-27 | Discharge: 2023-08-27 | Disposition: A | Discharge status: Home / Self Care | Payer: Medicare Other | Source: Ambulatory Visit | Attending: Nephrology

## 2023-08-27 ENCOUNTER — Other Ambulatory Visit: Payer: Self-pay | Admitting: Internal Medicine

## 2023-08-27 DIAGNOSIS — N185 Chronic kidney disease, stage 5: Secondary | ICD-10-CM | POA: Diagnosis not present

## 2023-08-27 DIAGNOSIS — E8722 Chronic metabolic acidosis: Secondary | ICD-10-CM | POA: Diagnosis present

## 2023-08-27 DIAGNOSIS — I214 Non-ST elevation (NSTEMI) myocardial infarction: Secondary | ICD-10-CM | POA: Diagnosis not present

## 2023-08-27 DIAGNOSIS — R0989 Other specified symptoms and signs involving the circulatory and respiratory systems: Secondary | ICD-10-CM | POA: Diagnosis not present

## 2023-08-27 DIAGNOSIS — E872 Acidosis, unspecified: Secondary | ICD-10-CM | POA: Diagnosis not present

## 2023-08-27 DIAGNOSIS — I10 Essential (primary) hypertension: Secondary | ICD-10-CM | POA: Diagnosis not present

## 2023-08-27 DIAGNOSIS — I161 Hypertensive emergency: Secondary | ICD-10-CM | POA: Diagnosis not present

## 2023-08-27 DIAGNOSIS — E8770 Fluid overload, unspecified: Secondary | ICD-10-CM | POA: Diagnosis not present

## 2023-08-27 DIAGNOSIS — I2510 Atherosclerotic heart disease of native coronary artery without angina pectoris: Secondary | ICD-10-CM | POA: Diagnosis present

## 2023-08-27 DIAGNOSIS — I132 Hypertensive heart and chronic kidney disease with heart failure and with stage 5 chronic kidney disease, or end stage renal disease: Secondary | ICD-10-CM | POA: Diagnosis not present

## 2023-08-27 DIAGNOSIS — Z8673 Personal history of transient ischemic attack (TIA), and cerebral infarction without residual deficits: Secondary | ICD-10-CM | POA: Diagnosis not present

## 2023-08-27 DIAGNOSIS — E039 Hypothyroidism, unspecified: Secondary | ICD-10-CM | POA: Diagnosis not present

## 2023-08-27 DIAGNOSIS — I739 Peripheral vascular disease, unspecified: Secondary | ICD-10-CM | POA: Diagnosis not present

## 2023-08-27 DIAGNOSIS — Z85819 Personal history of malignant neoplasm of unspecified site of lip, oral cavity, and pharynx: Secondary | ICD-10-CM | POA: Diagnosis not present

## 2023-08-27 DIAGNOSIS — F329 Major depressive disorder, single episode, unspecified: Secondary | ICD-10-CM | POA: Diagnosis present

## 2023-08-27 DIAGNOSIS — E7800 Pure hypercholesterolemia, unspecified: Secondary | ICD-10-CM | POA: Diagnosis present

## 2023-08-27 DIAGNOSIS — Z79899 Other long term (current) drug therapy: Secondary | ICD-10-CM | POA: Diagnosis not present

## 2023-08-27 DIAGNOSIS — J449 Chronic obstructive pulmonary disease, unspecified: Secondary | ICD-10-CM | POA: Diagnosis present

## 2023-08-27 DIAGNOSIS — D649 Anemia, unspecified: Secondary | ICD-10-CM | POA: Diagnosis not present

## 2023-08-27 DIAGNOSIS — Z923 Personal history of irradiation: Secondary | ICD-10-CM | POA: Diagnosis not present

## 2023-08-27 DIAGNOSIS — I169 Hypertensive crisis, unspecified: Secondary | ICD-10-CM | POA: Diagnosis not present

## 2023-08-27 DIAGNOSIS — J9621 Acute and chronic respiratory failure with hypoxia: Secondary | ICD-10-CM | POA: Diagnosis not present

## 2023-08-27 DIAGNOSIS — D631 Anemia in chronic kidney disease: Secondary | ICD-10-CM | POA: Diagnosis not present

## 2023-08-27 DIAGNOSIS — I21A1 Myocardial infarction type 2: Secondary | ICD-10-CM | POA: Diagnosis present

## 2023-08-27 DIAGNOSIS — J811 Chronic pulmonary edema: Secondary | ICD-10-CM | POA: Diagnosis not present

## 2023-08-27 DIAGNOSIS — N186 End stage renal disease: Secondary | ICD-10-CM | POA: Diagnosis not present

## 2023-08-27 DIAGNOSIS — Z66 Do not resuscitate: Secondary | ICD-10-CM | POA: Diagnosis present

## 2023-08-27 DIAGNOSIS — I5033 Acute on chronic diastolic (congestive) heart failure: Secondary | ICD-10-CM | POA: Diagnosis not present

## 2023-08-27 DIAGNOSIS — I480 Paroxysmal atrial fibrillation: Secondary | ICD-10-CM | POA: Diagnosis not present

## 2023-08-27 DIAGNOSIS — Z79890 Hormone replacement therapy (postmenopausal): Secondary | ICD-10-CM | POA: Diagnosis not present

## 2023-08-27 DIAGNOSIS — R0789 Other chest pain: Secondary | ICD-10-CM | POA: Diagnosis not present

## 2023-08-27 DIAGNOSIS — Z86718 Personal history of other venous thrombosis and embolism: Secondary | ICD-10-CM | POA: Diagnosis not present

## 2023-08-27 DIAGNOSIS — I25112 Atherosclerotic heart disease of native coronary artery with refractory angina pectoris: Secondary | ICD-10-CM | POA: Diagnosis not present

## 2023-08-27 DIAGNOSIS — Z7189 Other specified counseling: Secondary | ICD-10-CM | POA: Diagnosis not present

## 2023-08-27 DIAGNOSIS — F411 Generalized anxiety disorder: Secondary | ICD-10-CM | POA: Diagnosis not present

## 2023-08-27 DIAGNOSIS — Z87891 Personal history of nicotine dependence: Secondary | ICD-10-CM | POA: Diagnosis not present

## 2023-08-27 DIAGNOSIS — I517 Cardiomegaly: Secondary | ICD-10-CM | POA: Diagnosis not present

## 2023-08-27 DIAGNOSIS — R079 Chest pain, unspecified: Secondary | ICD-10-CM | POA: Diagnosis not present

## 2023-08-27 DIAGNOSIS — Z931 Gastrostomy status: Secondary | ICD-10-CM | POA: Diagnosis not present

## 2023-08-27 DIAGNOSIS — Z930 Tracheostomy status: Secondary | ICD-10-CM | POA: Diagnosis not present

## 2023-08-27 DIAGNOSIS — I509 Heart failure, unspecified: Secondary | ICD-10-CM | POA: Diagnosis not present

## 2023-08-27 DIAGNOSIS — I2489 Other forms of acute ischemic heart disease: Secondary | ICD-10-CM | POA: Diagnosis not present

## 2023-08-27 DIAGNOSIS — Z515 Encounter for palliative care: Secondary | ICD-10-CM | POA: Diagnosis not present

## 2023-08-27 DIAGNOSIS — R110 Nausea: Secondary | ICD-10-CM | POA: Diagnosis not present

## 2023-08-27 DIAGNOSIS — J939 Pneumothorax, unspecified: Secondary | ICD-10-CM | POA: Diagnosis not present

## 2023-08-27 DIAGNOSIS — R0602 Shortness of breath: Secondary | ICD-10-CM | POA: Diagnosis not present

## 2023-08-27 MED ORDER — SODIUM CHLORIDE 0.9% IV SOLUTION: Freq: Once | INTRAVENOUS | Status: DC

## 2023-08-29 ENCOUNTER — Emergency Department (HOSPITAL_COMMUNITY): Payer: Medicare Other

## 2023-08-29 ENCOUNTER — Inpatient Hospital Stay (HOSPITAL_COMMUNITY)
Admission: EM | Admit: 2023-08-29 | Discharge: 2023-09-07 | DRG: 280 | Disposition: A | Discharge status: Home / Self Care | Payer: Medicare Other | Attending: Internal Medicine | Admitting: Internal Medicine

## 2023-08-29 ENCOUNTER — Other Ambulatory Visit: Payer: Self-pay

## 2023-08-29 ENCOUNTER — Encounter (HOSPITAL_COMMUNITY): Payer: Self-pay | Admitting: Emergency Medicine

## 2023-08-29 DIAGNOSIS — Z8673 Personal history of transient ischemic attack (TIA), and cerebral infarction without residual deficits: Secondary | ICD-10-CM

## 2023-08-29 DIAGNOSIS — F329 Major depressive disorder, single episode, unspecified: Secondary | ICD-10-CM | POA: Diagnosis present

## 2023-08-29 DIAGNOSIS — E8770 Fluid overload, unspecified: Secondary | ICD-10-CM

## 2023-08-29 DIAGNOSIS — D649 Anemia, unspecified: Secondary | ICD-10-CM | POA: Diagnosis present

## 2023-08-29 DIAGNOSIS — Z808 Family history of malignant neoplasm of other organs or systems: Secondary | ICD-10-CM

## 2023-08-29 DIAGNOSIS — M549 Dorsalgia, unspecified: Secondary | ICD-10-CM | POA: Diagnosis present

## 2023-08-29 DIAGNOSIS — N186 End stage renal disease: Secondary | ICD-10-CM

## 2023-08-29 DIAGNOSIS — I25112 Atherosclerotic heart disease of native coronary artery with refractory angina pectoris: Secondary | ICD-10-CM

## 2023-08-29 DIAGNOSIS — E8722 Chronic metabolic acidosis: Secondary | ICD-10-CM | POA: Diagnosis present

## 2023-08-29 DIAGNOSIS — J811 Chronic pulmonary edema: Secondary | ICD-10-CM | POA: Diagnosis not present

## 2023-08-29 DIAGNOSIS — Z888 Allergy status to other drugs, medicaments and biological substances status: Secondary | ICD-10-CM

## 2023-08-29 DIAGNOSIS — E039 Hypothyroidism, unspecified: Secondary | ICD-10-CM | POA: Diagnosis present

## 2023-08-29 DIAGNOSIS — Z87891 Personal history of nicotine dependence: Secondary | ICD-10-CM

## 2023-08-29 DIAGNOSIS — I5033 Acute on chronic diastolic (congestive) heart failure: Secondary | ICD-10-CM | POA: Diagnosis present

## 2023-08-29 DIAGNOSIS — I739 Peripheral vascular disease, unspecified: Secondary | ICD-10-CM | POA: Diagnosis present

## 2023-08-29 DIAGNOSIS — D631 Anemia in chronic kidney disease: Secondary | ICD-10-CM | POA: Diagnosis present

## 2023-08-29 DIAGNOSIS — Z8701 Personal history of pneumonia (recurrent): Secondary | ICD-10-CM

## 2023-08-29 DIAGNOSIS — I2510 Atherosclerotic heart disease of native coronary artery without angina pectoris: Secondary | ICD-10-CM | POA: Diagnosis present

## 2023-08-29 DIAGNOSIS — I169 Hypertensive crisis, unspecified: Secondary | ICD-10-CM | POA: Diagnosis not present

## 2023-08-29 DIAGNOSIS — K219 Gastro-esophageal reflux disease without esophagitis: Secondary | ICD-10-CM | POA: Diagnosis present

## 2023-08-29 DIAGNOSIS — Z7902 Long term (current) use of antithrombotics/antiplatelets: Secondary | ICD-10-CM

## 2023-08-29 DIAGNOSIS — G8929 Other chronic pain: Secondary | ICD-10-CM | POA: Diagnosis present

## 2023-08-29 DIAGNOSIS — I509 Heart failure, unspecified: Secondary | ICD-10-CM | POA: Diagnosis not present

## 2023-08-29 DIAGNOSIS — Z880 Allergy status to penicillin: Secondary | ICD-10-CM

## 2023-08-29 DIAGNOSIS — Z8542 Personal history of malignant neoplasm of other parts of uterus: Secondary | ICD-10-CM

## 2023-08-29 DIAGNOSIS — Z8521 Personal history of malignant neoplasm of larynx: Secondary | ICD-10-CM

## 2023-08-29 DIAGNOSIS — R0989 Other specified symptoms and signs involving the circulatory and respiratory systems: Secondary | ICD-10-CM | POA: Diagnosis not present

## 2023-08-29 DIAGNOSIS — R0601 Orthopnea: Secondary | ICD-10-CM | POA: Diagnosis present

## 2023-08-29 DIAGNOSIS — I214 Non-ST elevation (NSTEMI) myocardial infarction: Secondary | ICD-10-CM | POA: Diagnosis present

## 2023-08-29 DIAGNOSIS — Z930 Tracheostomy status: Secondary | ICD-10-CM

## 2023-08-29 DIAGNOSIS — Z66 Do not resuscitate: Secondary | ICD-10-CM | POA: Diagnosis present

## 2023-08-29 DIAGNOSIS — Z91158 Patient's noncompliance with renal dialysis for other reason: Secondary | ICD-10-CM

## 2023-08-29 DIAGNOSIS — Z79890 Hormone replacement therapy (postmenopausal): Secondary | ICD-10-CM

## 2023-08-29 DIAGNOSIS — G4700 Insomnia, unspecified: Secondary | ICD-10-CM | POA: Diagnosis present

## 2023-08-29 DIAGNOSIS — N185 Chronic kidney disease, stage 5: Secondary | ICD-10-CM

## 2023-08-29 DIAGNOSIS — Z86718 Personal history of other venous thrombosis and embolism: Secondary | ICD-10-CM

## 2023-08-29 DIAGNOSIS — Z7982 Long term (current) use of aspirin: Secondary | ICD-10-CM

## 2023-08-29 DIAGNOSIS — J449 Chronic obstructive pulmonary disease, unspecified: Secondary | ICD-10-CM | POA: Diagnosis present

## 2023-08-29 DIAGNOSIS — Z79899 Other long term (current) drug therapy: Secondary | ICD-10-CM

## 2023-08-29 DIAGNOSIS — Z85819 Personal history of malignant neoplasm of unspecified site of lip, oral cavity, and pharynx: Secondary | ICD-10-CM | POA: Insufficient documentation

## 2023-08-29 DIAGNOSIS — I161 Hypertensive emergency: Secondary | ICD-10-CM

## 2023-08-29 DIAGNOSIS — Z7901 Long term (current) use of anticoagulants: Secondary | ICD-10-CM

## 2023-08-29 DIAGNOSIS — Z923 Personal history of irradiation: Secondary | ICD-10-CM

## 2023-08-29 DIAGNOSIS — I480 Paroxysmal atrial fibrillation: Secondary | ICD-10-CM | POA: Diagnosis present

## 2023-08-29 DIAGNOSIS — E038 Other specified hypothyroidism: Secondary | ICD-10-CM

## 2023-08-29 DIAGNOSIS — R0789 Other chest pain: Secondary | ICD-10-CM | POA: Diagnosis not present

## 2023-08-29 DIAGNOSIS — R110 Nausea: Secondary | ICD-10-CM | POA: Diagnosis not present

## 2023-08-29 DIAGNOSIS — R079 Chest pain, unspecified: Secondary | ICD-10-CM | POA: Diagnosis not present

## 2023-08-29 DIAGNOSIS — E7800 Pure hypercholesterolemia, unspecified: Secondary | ICD-10-CM | POA: Diagnosis present

## 2023-08-29 DIAGNOSIS — I21A1 Myocardial infarction type 2: Secondary | ICD-10-CM | POA: Diagnosis present

## 2023-08-29 DIAGNOSIS — F411 Generalized anxiety disorder: Secondary | ICD-10-CM | POA: Diagnosis present

## 2023-08-29 DIAGNOSIS — I10 Essential (primary) hypertension: Secondary | ICD-10-CM | POA: Diagnosis not present

## 2023-08-29 DIAGNOSIS — I132 Hypertensive heart and chronic kidney disease with heart failure and with stage 5 chronic kidney disease, or end stage renal disease: Principal | ICD-10-CM | POA: Diagnosis present

## 2023-08-29 DIAGNOSIS — Z931 Gastrostomy status: Secondary | ICD-10-CM

## 2023-08-29 LAB — BPAM RBC
Blood Product Expiration Date: 202410082359
ISSUE DATE / TIME: 202409130819
Unit Type and Rh: 5100

## 2023-08-29 LAB — BASIC METABOLIC PANEL
Anion gap: 20 — ABNORMAL HIGH (ref 5–15)
BUN: 97 mg/dL — ABNORMAL HIGH (ref 8–23)
CO2: 20 mmol/L — ABNORMAL LOW (ref 22–32)
Calcium: 9.1 mg/dL (ref 8.9–10.3)
Chloride: 100 mmol/L (ref 98–111)
Creatinine, Ser: 6.91 mg/dL — ABNORMAL HIGH (ref 0.44–1.00)
GFR, Estimated: 6 mL/min — ABNORMAL LOW (ref >60)
Glucose, Bld: 122 mg/dL — ABNORMAL HIGH (ref 70–99)
Potassium: 4.6 mmol/L (ref 3.5–5.1)
Sodium: 140 mmol/L (ref 135–145)

## 2023-08-29 LAB — TYPE AND SCREEN
ABO/RH(D): O POS
Antibody Screen: NEGATIVE
Unit division: 0

## 2023-08-29 LAB — CBC
HCT: 26.5 % — ABNORMAL LOW (ref 36.0–46.0)
Hemoglobin: 8.3 g/dL — ABNORMAL LOW (ref 12.0–15.0)
MCH: 28.4 pg (ref 26.0–34.0)
MCHC: 31.3 g/dL (ref 30.0–36.0)
MCV: 90.8 fL (ref 80.0–100.0)
Platelets: 220 10*3/uL (ref 150–400)
RBC: 2.92 MIL/uL — ABNORMAL LOW (ref 3.87–5.11)
RDW: 14.6 % (ref 11.5–15.5)
WBC: 7.7 10*3/uL (ref 4.0–10.5)
nRBC: 0 % (ref 0.0–0.2)

## 2023-08-29 LAB — TROPONIN I (HIGH SENSITIVITY): Troponin I (High Sensitivity): 574 ng/L (ref <18)

## 2023-08-29 MED ORDER — MORPHINE SULFATE (PF) 4 MG/ML IV SOLN
4.0000 mg | Freq: Once | INTRAVENOUS | Status: AC
  Administered 2023-08-29: 4 mg via INTRAVENOUS
  Filled 2023-08-29: qty 1

## 2023-08-29 MED ORDER — ONDANSETRON HCL 4 MG/2ML IJ SOLN
4.0000 mg | Freq: Once | INTRAMUSCULAR | Status: AC
  Administered 2023-08-29: 4 mg via INTRAVENOUS
  Filled 2023-08-29: qty 2

## 2023-08-29 MED ORDER — NITROGLYCERIN 0.4 MG SL SUBL: 0.4000 mg | SUBLINGUAL_TABLET | SUBLINGUAL | Status: DC | PRN

## 2023-08-29 MED ORDER — HYDRALAZINE HCL 20 MG/ML IJ SOLN
10.0000 mg | INTRAMUSCULAR | Status: AC
  Administered 2023-08-30: 10 mg via INTRAVENOUS
  Filled 2023-08-29: qty 1

## 2023-08-29 NOTE — ED Provider Notes (Signed)
[facility] Emergency Department Provider Note MRN:  [id number]  Arrival date & time: 08/30/23     Chief Complaint   Chest Pain   History of Present Illness   [name] is a 77 y.o. year-old female presents to the ED with chief complaint of CP.  BIB EMS.  Has had nausea and SOB all day.  Tonight started having central chest pain.  Was given SL nitroglycerin without much benefit.    Hx of ESRD not being treated.  Has anemia of chronic disease.  Family reports that the onset of her SOB and nausea was after having had her retacrit injection and subsequent transfusion.  Family says that they gave her 2 of her BP meds, but it hasn't come down.     Review of Systems  Pertinent positive and negative review of systems noted in HPI.    Physical Exam   Vitals:   08/30/23 0015 08/30/23 0030  BP: (!) 204/75 (!) 190/72  Pulse: 76 74  Resp: (!) 25 19  Temp:    SpO2: 95% 95%    CONSTITUTIONAL:  chronically ill-appearing, NAD NEURO:  Alert and oriented x 3, CN 3-12 grossly intact EYES:  eyes equal and reactive ENT/NECK:  Supple, no stridor, trach stoma CARDIO:  normal rate, regular rhythm, appears well-perfused  PULM:  No respiratory distress, CTAB GI/GU:  non-distended, no focal tenderness MSK/SPINE:  No gross deformities, no edema, moves all extremities  SKIN:  no rash, atraumatic   *Additional and/or pertinent findings included in MDM below  Diagnostic and Interventional Summary    EKG Interpretation Date/Time:  Sunday August 29 2023 22:25:39 EDT Ventricular Rate:  77 PR Interval:  149 QRS Duration:  87 QT Interval:  487 QTC Calculation: 552 R Axis:   96  Text Interpretation: Sinus rhythm Probable left atrial enlargement Right axis deviation LVH by voltage Anterior Q waves, possibly due to LVH Prolonged QT interval Abnormal ECG Confirmed by Anders Simmonds [phone]) on 08/29/2023 10:29:35 PM       Labs Reviewed  BASIC METABOLIC PANEL  - Abnormal; Notable for the following components:      Result Value   CO2 20 (*)    Glucose, Bld 122 (*)    BUN 97 (*)    Creatinine, Ser 6.91 (*)    GFR, Estimated 6 (*)    Anion gap 20 (*)    All other components within normal limits  CBC - Abnormal; Notable for the following components:   RBC 2.92 (*)    Hemoglobin 8.3 (*)    HCT 26.5 (*)    All other components within normal limits  TROPONIN I (HIGH SENSITIVITY) - Abnormal; Notable for the following components:   Troponin I (High Sensitivity) 574 (*)    All other components within normal limits  BRAIN NATRIURETIC PEPTIDE  TROPONIN I (HIGH SENSITIVITY)    DG Chest 2 View  Final Result      Medications  nitroGLYCERIN 50 mg in dextrose 5 % 250 mL (0.2 mg/mL) infusion (40 mcg/min Intravenous Infusion Verify 08/30/23 0058)  morphine (PF) 4 MG/ML injection 4 mg (4 mg Intravenous Given 08/29/23 2323)  ondansetron (ZOFRAN) injection 4 mg (4 mg Intravenous Given 08/29/23 2324)  hydrALAZINE (APRESOLINE) injection 10 mg (10 mg Intravenous Given 08/30/23 0001)  furosemide (LASIX) injection 80 mg (80 mg Intravenous Given 08/30/23 0036)     Procedures  /  Critical Care .Critical Care  Performed by: Roxy Horseman, PA-C Authorized by: Roxy Horseman,  PA-C   Critical care provider statement:    Critical care time (minutes):  46   Critical care was necessary to treat or prevent imminent or life-threatening deterioration of the following conditions: hypertensive crisis.   Critical care was time spent personally by me on the following activities:  Development of treatment plan with patient or surrogate, discussions with consultants, evaluation of patient's response to treatment, examination of patient, ordering and review of laboratory studies, ordering and review of radiographic studies, ordering and performing treatments and interventions, pulse oximetry, re-evaluation of patient's condition and review of old charts   ED Course and  Medical Decision Making  I have reviewed the triage vital signs, the nursing notes, and pertinent available records from the EMR.  Social Determinants Affecting Complexity of Care: Patient has no clinically significant social determinants affecting this chief complaint..   ED Course:    Medical Decision Making Patient here with chest pain and elevated blood pressure.  History of ESRD/stage V CKD.  She is not on dialysis.  She does not want dialysis.  She has noted to be quite hypertensive.  Also complaining no chest pain.  Concern for hypertensive emergency.  Troponins are elevated, but seem chronically elevated likely secondary to her kidney disease.  Chest x-ray shows CHF with edema.  Will give Lasix.  She did not take her home dose today.  Patient seen by and discussed with Dr. Manus Gunning, who recommends also starting a nitroglycerin infusion.  Patient will need admission.  Amount and/or Complexity of Data Reviewed Labs: ordered.    Details: Trop 574, similar to most recents HGB is 8.3, improved from recent labs Radiology: ordered and independent interpretation performed.    Details: cardiomegaly  Risk Prescription drug management. Decision regarding hospitalization.         Consultants: I consulted with Hospitalist, Dr. Janalyn Shy, who is appreciated for admitting.   Treatment and Plan: Patient's exam and diagnostic results are concerning for hypertensive emergency and chest pain.  Feel that patient will need admission to the hospital for further treatment and evaluation.  Patient seen by and discussed with attending physician, Dr. Manus Gunning, who agrees with plan.  Final Clinical Impressions(s) / ED Diagnoses     ICD-10-CM   1. Hypertensive crisis  I16.9     2. Chest pain, unspecified type  R07.9       ED Discharge Orders     None         Discharge Instructions Discussed with and Provided to Patient:   Discharge Instructions   None      Roxy Horseman, PA-C 08/30/23 0102    Glynn Octave, MD 08/31/23 0157

## 2023-08-29 NOTE — ED Notes (Signed)
Family has arrived for translation

## 2023-08-29 NOTE — ED Triage Notes (Addendum)
Pt BIB EMS from home for nausea and ShOB all day. Tonight started having centralized CP non-radiating. Zofran, 324 ASA and 1 nitroglycerin by family prior to EMS arrival, 2 more nitroglycerin given by EMS. Hx of end stage renal which she is not receiving treatment for.  EMS VS: 240/100 HR 76 97% RA

## 2023-08-30 ENCOUNTER — Encounter (HOSPITAL_COMMUNITY): Payer: Self-pay | Admitting: Internal Medicine

## 2023-08-30 DIAGNOSIS — R079 Chest pain, unspecified: Secondary | ICD-10-CM

## 2023-08-30 DIAGNOSIS — I214 Non-ST elevation (NSTEMI) myocardial infarction: Secondary | ICD-10-CM | POA: Diagnosis not present

## 2023-08-30 DIAGNOSIS — I5033 Acute on chronic diastolic (congestive) heart failure: Secondary | ICD-10-CM | POA: Diagnosis present

## 2023-08-30 DIAGNOSIS — Z931 Gastrostomy status: Secondary | ICD-10-CM | POA: Diagnosis not present

## 2023-08-30 DIAGNOSIS — F411 Generalized anxiety disorder: Secondary | ICD-10-CM | POA: Diagnosis present

## 2023-08-30 DIAGNOSIS — Z66 Do not resuscitate: Secondary | ICD-10-CM | POA: Diagnosis present

## 2023-08-30 DIAGNOSIS — Z85819 Personal history of malignant neoplasm of unspecified site of lip, oral cavity, and pharynx: Secondary | ICD-10-CM | POA: Diagnosis not present

## 2023-08-30 DIAGNOSIS — J9621 Acute and chronic respiratory failure with hypoxia: Secondary | ICD-10-CM | POA: Diagnosis not present

## 2023-08-30 DIAGNOSIS — I480 Paroxysmal atrial fibrillation: Secondary | ICD-10-CM | POA: Diagnosis present

## 2023-08-30 DIAGNOSIS — E8770 Fluid overload, unspecified: Secondary | ICD-10-CM

## 2023-08-30 DIAGNOSIS — I169 Hypertensive crisis, unspecified: Secondary | ICD-10-CM | POA: Diagnosis not present

## 2023-08-30 DIAGNOSIS — J939 Pneumothorax, unspecified: Secondary | ICD-10-CM | POA: Diagnosis not present

## 2023-08-30 DIAGNOSIS — J449 Chronic obstructive pulmonary disease, unspecified: Secondary | ICD-10-CM | POA: Diagnosis present

## 2023-08-30 DIAGNOSIS — R0989 Other specified symptoms and signs involving the circulatory and respiratory systems: Secondary | ICD-10-CM | POA: Diagnosis not present

## 2023-08-30 DIAGNOSIS — I25112 Atherosclerotic heart disease of native coronary artery with refractory angina pectoris: Secondary | ICD-10-CM

## 2023-08-30 DIAGNOSIS — I2510 Atherosclerotic heart disease of native coronary artery without angina pectoris: Secondary | ICD-10-CM | POA: Diagnosis present

## 2023-08-30 DIAGNOSIS — I161 Hypertensive emergency: Secondary | ICD-10-CM

## 2023-08-30 DIAGNOSIS — D649 Anemia, unspecified: Secondary | ICD-10-CM | POA: Diagnosis not present

## 2023-08-30 DIAGNOSIS — I739 Peripheral vascular disease, unspecified: Secondary | ICD-10-CM | POA: Diagnosis present

## 2023-08-30 DIAGNOSIS — Z930 Tracheostomy status: Secondary | ICD-10-CM | POA: Diagnosis not present

## 2023-08-30 DIAGNOSIS — R0602 Shortness of breath: Secondary | ICD-10-CM | POA: Diagnosis not present

## 2023-08-30 DIAGNOSIS — I2489 Other forms of acute ischemic heart disease: Secondary | ICD-10-CM | POA: Diagnosis not present

## 2023-08-30 DIAGNOSIS — E7800 Pure hypercholesterolemia, unspecified: Secondary | ICD-10-CM | POA: Diagnosis present

## 2023-08-30 DIAGNOSIS — I517 Cardiomegaly: Secondary | ICD-10-CM | POA: Diagnosis not present

## 2023-08-30 DIAGNOSIS — Z79890 Hormone replacement therapy (postmenopausal): Secondary | ICD-10-CM | POA: Diagnosis not present

## 2023-08-30 DIAGNOSIS — Z87891 Personal history of nicotine dependence: Secondary | ICD-10-CM | POA: Diagnosis not present

## 2023-08-30 DIAGNOSIS — N185 Chronic kidney disease, stage 5: Secondary | ICD-10-CM | POA: Diagnosis not present

## 2023-08-30 DIAGNOSIS — Z923 Personal history of irradiation: Secondary | ICD-10-CM | POA: Diagnosis not present

## 2023-08-30 DIAGNOSIS — J811 Chronic pulmonary edema: Secondary | ICD-10-CM | POA: Diagnosis not present

## 2023-08-30 DIAGNOSIS — E039 Hypothyroidism, unspecified: Secondary | ICD-10-CM | POA: Diagnosis present

## 2023-08-30 DIAGNOSIS — I132 Hypertensive heart and chronic kidney disease with heart failure and with stage 5 chronic kidney disease, or end stage renal disease: Secondary | ICD-10-CM | POA: Diagnosis present

## 2023-08-30 DIAGNOSIS — Z515 Encounter for palliative care: Secondary | ICD-10-CM | POA: Diagnosis not present

## 2023-08-30 DIAGNOSIS — E8722 Chronic metabolic acidosis: Secondary | ICD-10-CM | POA: Diagnosis present

## 2023-08-30 DIAGNOSIS — E872 Acidosis, unspecified: Secondary | ICD-10-CM | POA: Diagnosis not present

## 2023-08-30 DIAGNOSIS — Z7189 Other specified counseling: Secondary | ICD-10-CM | POA: Diagnosis not present

## 2023-08-30 DIAGNOSIS — I21A1 Myocardial infarction type 2: Secondary | ICD-10-CM | POA: Diagnosis present

## 2023-08-30 DIAGNOSIS — Z79899 Other long term (current) drug therapy: Secondary | ICD-10-CM | POA: Diagnosis not present

## 2023-08-30 DIAGNOSIS — Z8673 Personal history of transient ischemic attack (TIA), and cerebral infarction without residual deficits: Secondary | ICD-10-CM | POA: Diagnosis not present

## 2023-08-30 DIAGNOSIS — N186 End stage renal disease: Secondary | ICD-10-CM | POA: Diagnosis present

## 2023-08-30 DIAGNOSIS — Z86718 Personal history of other venous thrombosis and embolism: Secondary | ICD-10-CM | POA: Diagnosis not present

## 2023-08-30 DIAGNOSIS — D631 Anemia in chronic kidney disease: Secondary | ICD-10-CM | POA: Diagnosis present

## 2023-08-30 DIAGNOSIS — F329 Major depressive disorder, single episode, unspecified: Secondary | ICD-10-CM | POA: Diagnosis present

## 2023-08-30 LAB — CBC
HCT: 25.8 % — ABNORMAL LOW (ref 36.0–46.0)
Hemoglobin: 7.9 g/dL — ABNORMAL LOW (ref 12.0–15.0)
MCH: 28.6 pg (ref 26.0–34.0)
MCHC: 30.6 g/dL (ref 30.0–36.0)
MCV: 93.5 fL (ref 80.0–100.0)
Platelets: 215 10*3/uL (ref 150–400)
RBC: 2.76 MIL/uL — ABNORMAL LOW (ref 3.87–5.11)
RDW: 14.6 % (ref 11.5–15.5)
WBC: 7.5 10*3/uL (ref 4.0–10.5)
nRBC: 0 % (ref 0.0–0.2)

## 2023-08-30 LAB — COMPREHENSIVE METABOLIC PANEL
ALT: 9 U/L (ref 0–44)
AST: 16 U/L (ref 15–41)
Albumin: 2.8 g/dL — ABNORMAL LOW (ref 3.5–5.0)
Alkaline Phosphatase: 63 U/L (ref 38–126)
Anion gap: 17 — ABNORMAL HIGH (ref 5–15)
BUN: 96 mg/dL — ABNORMAL HIGH (ref 8–23)
CO2: 20 mmol/L — ABNORMAL LOW (ref 22–32)
Calcium: 8.8 mg/dL — ABNORMAL LOW (ref 8.9–10.3)
Chloride: 102 mmol/L (ref 98–111)
Creatinine, Ser: 7.14 mg/dL — ABNORMAL HIGH (ref 0.44–1.00)
GFR, Estimated: 5 mL/min — ABNORMAL LOW (ref >60)
Glucose, Bld: 103 mg/dL — ABNORMAL HIGH (ref 70–99)
Potassium: 4.4 mmol/L (ref 3.5–5.1)
Sodium: 139 mmol/L (ref 135–145)
Total Bilirubin: 0.8 mg/dL (ref 0.3–1.2)
Total Protein: 5.8 g/dL — ABNORMAL LOW (ref 6.5–8.1)

## 2023-08-30 LAB — TROPONIN I (HIGH SENSITIVITY)
Troponin I (High Sensitivity): 475 ng/L (ref <18)
Troponin I (High Sensitivity): 448 ng/L (ref <18)
Troponin I (High Sensitivity): 413 ng/L (ref <18)

## 2023-08-30 LAB — BRAIN NATRIURETIC PEPTIDE: B Natriuretic Peptide: 879.1 pg/mL — ABNORMAL HIGH (ref 0.0–100.0)

## 2023-08-30 LAB — GLUCOSE, CAPILLARY: Glucose-Capillary: 142 mg/dL — ABNORMAL HIGH (ref 70–99)

## 2023-08-30 LAB — MRSA NEXT GEN BY PCR, NASAL: MRSA by PCR Next Gen: DETECTED — AB

## 2023-08-30 MED ORDER — FUROSEMIDE 10 MG/ML IJ SOLN
40.0000 mg | Freq: Once | INTRAMUSCULAR | Status: AC
  Administered 2023-08-30: 40 mg via INTRAVENOUS
  Filled 2023-08-30: qty 4

## 2023-08-30 MED ORDER — CHLORHEXIDINE GLUCONATE CLOTH 2 % EX PADS
6.0000 | MEDICATED_PAD | Freq: Every day | CUTANEOUS | Status: DC
  Administered 2023-08-30 – 2023-09-05 (×5): 6 via TOPICAL

## 2023-08-30 MED ORDER — FUROSEMIDE 10 MG/ML IJ SOLN
20.0000 mg/h | INTRAVENOUS | Status: AC
  Administered 2023-08-30 – 2023-08-31 (×2): 20 mg/h via INTRAVENOUS
  Filled 2023-08-30 (×3): qty 20

## 2023-08-30 MED ORDER — METOLAZONE 2.5 MG PO TABS
5.0000 mg | ORAL_TABLET | Freq: Once | ORAL | Status: AC
  Administered 2023-08-31: 5 mg via ORAL
  Filled 2023-08-30: qty 2

## 2023-08-30 MED ORDER — HYDRALAZINE HCL 50 MG PO TABS: 100.0000 mg | ORAL_TABLET | Freq: Three times a day (TID) | ORAL | Status: DC

## 2023-08-30 MED ORDER — IPRATROPIUM-ALBUTEROL 0.5-2.5 (3) MG/3ML IN SOLN
3.0000 mL | Freq: Four times a day (QID) | RESPIRATORY_TRACT | Status: DC | PRN
  Administered 2023-08-30: 3 mL via RESPIRATORY_TRACT
  Filled 2023-08-30: qty 3

## 2023-08-30 MED ORDER — FUROSEMIDE 10 MG/ML IJ SOLN
20.0000 mg/h | INTRAVENOUS | Status: DC
  Administered 2023-08-30 (×2): 20 mg/h via INTRAVENOUS
  Filled 2023-08-30: qty 20

## 2023-08-30 MED ORDER — SODIUM BICARBONATE 650 MG PO TABS
650.0000 mg | ORAL_TABLET | Freq: Three times a day (TID) | ORAL | Status: DC
  Administered 2023-08-30 – 2023-09-07 (×25): 650 mg via ORAL
  Filled 2023-08-30 (×26): qty 1

## 2023-08-30 MED ORDER — DULOXETINE HCL 30 MG PO CPEP
30.0000 mg | ORAL_CAPSULE | Freq: Every day | ORAL | Status: DC
  Administered 2023-08-30 – 2023-09-07 (×9): 30 mg via ORAL
  Filled 2023-08-30 (×9): qty 1

## 2023-08-30 MED ORDER — LEVOTHYROXINE SODIUM 50 MCG PO TABS
50.0000 ug | ORAL_TABLET | Freq: Every day | ORAL | Status: DC
  Administered 2023-08-30 – 2023-09-07 (×9): 50 ug via ORAL
  Filled 2023-08-30 (×2): qty 1
  Filled 2023-08-30: qty 2
  Filled 2023-08-30 (×2): qty 1
  Filled 2023-08-30: qty 2
  Filled 2023-08-30 (×2): qty 1
  Filled 2023-08-30: qty 2

## 2023-08-30 MED ORDER — AMLODIPINE BESYLATE 10 MG PO TABS
10.0000 mg | ORAL_TABLET | Freq: Every day | ORAL | Status: DC
  Administered 2023-08-30 – 2023-09-07 (×8): 10 mg via ORAL
  Filled 2023-08-30 (×9): qty 1

## 2023-08-30 MED ORDER — GUAIFENESIN ER 600 MG PO TB12
1200.0000 mg | ORAL_TABLET | Freq: Two times a day (BID) | ORAL | Status: DC
  Administered 2023-08-30 (×2): 1200 mg via ORAL
  Filled 2023-08-30 (×3): qty 2

## 2023-08-30 MED ORDER — ONDANSETRON HCL 4 MG PO TABS: 4.0000 mg | ORAL_TABLET | Freq: Four times a day (QID) | ORAL | Status: DC | PRN

## 2023-08-30 MED ORDER — HYDRALAZINE HCL 50 MG PO TABS
100.0000 mg | ORAL_TABLET | Freq: Three times a day (TID) | ORAL | Status: DC
  Administered 2023-08-30 – 2023-09-07 (×21): 100 mg via ORAL
  Filled 2023-08-30 (×16): qty 2
  Filled 2023-08-30: qty 4
  Filled 2023-08-30 (×2): qty 2
  Filled 2023-08-30: qty 4
  Filled 2023-08-30 (×5): qty 2

## 2023-08-30 MED ORDER — FUROSEMIDE 10 MG/ML IJ SOLN
20.0000 mg/h | INTRAVENOUS | Status: DC
  Administered 2023-08-30: 20 mg/h via INTRAVENOUS
  Filled 2023-08-30: qty 20

## 2023-08-30 MED ORDER — CALCITRIOL 0.25 MCG PO CAPS
0.2500 ug | ORAL_CAPSULE | Freq: Every day | ORAL | Status: DC
  Administered 2023-08-30 – 2023-09-07 (×9): 0.25 ug via ORAL
  Filled 2023-08-30 (×9): qty 1

## 2023-08-30 MED ORDER — NITROGLYCERIN IN D5W 200-5 MCG/ML-% IV SOLN
0.0000 ug/min | INTRAVENOUS | Status: DC
  Administered 2023-08-30: 105 ug/min via INTRAVENOUS
  Administered 2023-08-30: 100 ug/min via INTRAVENOUS
  Administered 2023-08-30: 150 ug/min via INTRAVENOUS
  Filled 2023-08-30: qty 250

## 2023-08-30 MED ORDER — CLONIDINE HCL 0.2 MG PO TABS
0.1000 mg | ORAL_TABLET | Freq: Once | ORAL | Status: AC
  Administered 2023-08-31: 0.1 mg via ORAL
  Filled 2023-08-30: qty 1

## 2023-08-30 MED ORDER — SODIUM CHLORIDE 0.9 % IV SOLN: 250.0000 mL | INTRAVENOUS | Status: DC | PRN

## 2023-08-30 MED ORDER — CLEVIDIPINE BUTYRATE 0.5 MG/ML IV EMUL
0.0000 mg/h | INTRAVENOUS | Status: DC
  Administered 2023-08-30: 2 mg/h via INTRAVENOUS
  Filled 2023-08-30: qty 100
  Filled 2023-08-30: qty 50

## 2023-08-30 MED ORDER — CARVEDILOL 25 MG PO TABS
25.0000 mg | ORAL_TABLET | Freq: Two times a day (BID) | ORAL | Status: DC
  Administered 2023-08-30 – 2023-09-07 (×17): 25 mg via ORAL
  Filled 2023-08-30: qty 1
  Filled 2023-08-30: qty 2
  Filled 2023-08-30 (×16): qty 1

## 2023-08-30 MED ORDER — LORAZEPAM 1 MG PO TABS: 1.0000 mg | ORAL_TABLET | Freq: Two times a day (BID) | ORAL | Status: DC | PRN

## 2023-08-30 MED ORDER — FUROSEMIDE 10 MG/ML IJ SOLN
80.0000 mg | INTRAMUSCULAR | Status: AC
  Administered 2023-08-30: 80 mg via INTRAVENOUS
  Filled 2023-08-30: qty 8

## 2023-08-30 MED ORDER — CLONIDINE HCL 0.2 MG PO TABS
0.2000 mg | ORAL_TABLET | Freq: Two times a day (BID) | ORAL | Status: DC
  Administered 2023-08-31 – 2023-09-07 (×14): 0.2 mg via ORAL
  Filled 2023-08-30 (×15): qty 1

## 2023-08-30 MED ORDER — HYDRALAZINE HCL 20 MG/ML IJ SOLN
INTRAMUSCULAR | Status: AC
  Administered 2023-08-30: 20 mg via INTRAVENOUS
  Filled 2023-08-30: qty 1

## 2023-08-30 MED ORDER — CLONIDINE HCL 0.1 MG/24HR TD PTWK
0.1000 mg | MEDICATED_PATCH | TRANSDERMAL | Status: DC
  Administered 2023-08-30: 0.1 mg via TRANSDERMAL
  Filled 2023-08-30: qty 1

## 2023-08-30 MED ORDER — CARVEDILOL 12.5 MG PO TABS: 25.0000 mg | ORAL_TABLET | Freq: Two times a day (BID) | ORAL | Status: DC

## 2023-08-30 MED ORDER — ONDANSETRON HCL 4 MG/2ML IJ SOLN
4.0000 mg | Freq: Four times a day (QID) | INTRAMUSCULAR | Status: DC | PRN
  Administered 2023-08-30 (×3): 4 mg via INTRAVENOUS
  Filled 2023-08-30 (×3): qty 2

## 2023-08-30 MED ORDER — MIRTAZAPINE 15 MG PO TABS
15.0000 mg | ORAL_TABLET | Freq: Every day | ORAL | Status: DC
  Administered 2023-08-31 – 2023-09-06 (×7): 15 mg via ORAL
  Filled 2023-08-30 (×7): qty 1

## 2023-08-30 MED ORDER — PANTOPRAZOLE SODIUM 40 MG PO TBEC
40.0000 mg | DELAYED_RELEASE_TABLET | Freq: Every day | ORAL | Status: DC
  Administered 2023-08-30 – 2023-09-07 (×9): 40 mg via ORAL
  Filled 2023-08-30 (×9): qty 1

## 2023-08-30 MED ORDER — CILOSTAZOL 100 MG PO TABS
100.0000 mg | ORAL_TABLET | Freq: Two times a day (BID) | ORAL | Status: DC
  Administered 2023-08-30 – 2023-09-07 (×17): 100 mg via ORAL
  Filled 2023-08-30 (×21): qty 1

## 2023-08-30 MED ORDER — ASPIRIN 81 MG PO TBEC
81.0000 mg | DELAYED_RELEASE_TABLET | Freq: Every day | ORAL | Status: DC
  Administered 2023-08-30 – 2023-09-02 (×4): 81 mg via ORAL
  Filled 2023-08-30 (×4): qty 1

## 2023-08-30 MED ORDER — SODIUM CHLORIDE 0.9% FLUSH
3.0000 mL | Freq: Two times a day (BID) | INTRAVENOUS | Status: DC
  Administered 2023-08-30 – 2023-09-07 (×17): 3 mL via INTRAVENOUS

## 2023-08-30 MED ORDER — ACETAMINOPHEN 650 MG RE SUPP: 650.0000 mg | Freq: Four times a day (QID) | RECTAL | Status: DC | PRN

## 2023-08-30 MED ORDER — ROSUVASTATIN CALCIUM 20 MG PO TABS
20.0000 mg | ORAL_TABLET | Freq: Every day | ORAL | Status: DC
  Administered 2023-08-30 – 2023-09-07 (×9): 20 mg via ORAL
  Filled 2023-08-30 (×7): qty 1
  Filled 2023-08-30: qty 4
  Filled 2023-08-30 (×2): qty 1

## 2023-08-30 MED ORDER — NITROGLYCERIN IN D5W 200-5 MCG/ML-% IV SOLN
0.0000 ug/min | INTRAVENOUS | Status: DC
  Administered 2023-08-30: 5 ug/min via INTRAVENOUS
  Filled 2023-08-30 (×2): qty 250

## 2023-08-30 MED ORDER — SODIUM CHLORIDE 0.9% FLUSH: 3.0000 mL | INTRAVENOUS | Status: DC | PRN

## 2023-08-30 MED ORDER — APIXABAN 2.5 MG PO TABS
2.5000 mg | ORAL_TABLET | Freq: Two times a day (BID) | ORAL | Status: DC
  Administered 2023-08-30 – 2023-09-07 (×18): 2.5 mg via ORAL
  Filled 2023-08-30 (×18): qty 1

## 2023-08-30 MED ORDER — LORAZEPAM 0.5 MG PO TABS: 0.5000 mg | ORAL_TABLET | Freq: Two times a day (BID) | ORAL | Status: DC | PRN

## 2023-08-30 MED ORDER — PROCHLORPERAZINE EDISYLATE 10 MG/2ML IJ SOLN
5.0000 mg | Freq: Four times a day (QID) | INTRAMUSCULAR | Status: DC | PRN
  Administered 2023-08-31: 5 mg via INTRAVENOUS
  Filled 2023-08-30 (×2): qty 1

## 2023-08-30 MED ORDER — HYDROMORPHONE HCL 1 MG/ML IJ SOLN
0.5000 mg | INTRAMUSCULAR | Status: DC | PRN
  Administered 2023-08-30 – 2023-09-06 (×2): 1 mg via INTRAVENOUS
  Filled 2023-08-30 (×2): qty 1

## 2023-08-30 MED ORDER — ACETAMINOPHEN 325 MG PO TABS
650.0000 mg | ORAL_TABLET | Freq: Four times a day (QID) | ORAL | Status: DC | PRN
  Administered 2023-09-01 – 2023-09-07 (×10): 650 mg via ORAL
  Filled 2023-08-30 (×10): qty 2

## 2023-08-30 MED ORDER — HYDRALAZINE HCL 20 MG/ML IJ SOLN: 20.0000 mg | Freq: Once | INTRAMUSCULAR | Status: AC

## 2023-08-30 MED ORDER — NITROGLYCERIN IN D5W 200-5 MCG/ML-% IV SOLN
0.0000 ug/min | INTRAVENOUS | Status: DC
  Administered 2023-08-30: 90 ug/min via INTRAVENOUS
  Administered 2023-08-30: 19.5 ug/min via INTRAVENOUS
  Filled 2023-08-30: qty 250

## 2023-08-30 MED ORDER — ASPIRIN 81 MG PO TBEC: 81.0000 mg | DELAYED_RELEASE_TABLET | Freq: Every day | ORAL | Status: DC

## 2023-08-30 NOTE — Progress Notes (Signed)
Subjective:  Patient admitted this morning, see detailed H&P by Dr Janalyn Shy  77 y.o. female with medical history significant of neck cancer status post laryngectomy now has ostomy, CKD stage V, history of CVA, history of DVT, essential hypertension, hypothyroidism, paroxysmal atrial fibrillation,  peripheral arterial disease, coronary artery disease generalized anxiety disorder, MDD, and chronic anemia presented to emergency department for ED complaining of chest pain and shortness of breath with associated nausea.  Patient reported she is having central chest pain which started tonight and not relieved with sublingual nitroglycerin at home.  she received outpatient blood transfusion and Epogen shot 2 days ago after that patient has been developed acutely worsening of shortness of breath and chest pain.   patient was admitted in April 2024 with similar finding she has been evaluated by inpatient nephrology and patient was declined dialysis during that time.  Nephrology and cardiology consulted.  Vitals:   08/30/23 0621 08/30/23 0630  BP: (!) 155/62 (!) 146/56  Pulse: 73 73  Resp: (!) 21 16  Temp:    SpO2: 90% 91%   Type 2 NSTEMI  blood transfusion on Friday for anemia, and starting Saturday developed severe central chest pressure rated at an 8/10 along with shortness of breath, likely from resulting volume overload  CT chest noncontrast showed three-vessel coronary artery calcifications, but she is unable to have a heart catheterization performed (extensive discussion previously) given CKD 5 and her refusal of dialysis  Aggressive diuresis per cardiology Continue nitroglycerin drip Continue home Apixaban Continue aspirin  ESRD Declined dialysis in the past -nephrology consulted - Cr is 6.91  Paroxysmal atrial fibrillation -Patient's CHA2DS2-VASc is 2 score 8. -Continue Coreg 25 mg twice daily and Eliquis 2.5 mg twice daily.   History of CAD -Continue aspirin and Crestor.    Peripheral arterial disease -Continue cilostazol 100 mg twice daily.   History of CVA -Continue aspirin and Crestor   Anemia of chronic disease due to ESRD - Hemoglobin 8.3 and hematocrit 26.  Patient received 1 unit blood transfusion 4 days ago.  Also she is getting Epogen shot monthly at nephrology clinic. - Continue to monitor CBC.   Chronic metabolic acidosis due to ESRD BMP showed low bicarb 20. -Continue bicarb 650 mg 3 times daily. -Discussed with nephrology, no intervention recommended. -Patient has refused ESRD in the past. -Palliative care consult recommended.   Generalized anxiety disorder - Continue Cymbalta 30 mg daily and and decreasing dose of Ativan 0.5 mg twice daily as needed in the setting of ESRD.   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  At home she is on regular diet.  Goals of care discussion -Palliative care consulted for goals of care discussion.     [name] [facility]

## 2023-08-30 NOTE — ED Notes (Signed)
Palliative having meeting at bedside with patient, son, and Nurse, learning disability.

## 2023-08-30 NOTE — H&P (Addendum)
History and Physical    [name] VHQ:[id number] DOB: [birth year] DOA: 08/29/2023  PCP: Myrlene Broker, MD   Patient coming from: Home   Chief Complaint:  Chief Complaint  Patient presents with   Chest Pain    HPI:  [name] is a 77 y.o. female with medical history significant of neck cancer status post laryngectomy now has ostomy, CKD stage V, history of CVA, history of DVT, essential hypertension, hypothyroidism, paroxysmal atrial fibrillation,  peripheral arterial disease, coronary artery disease generalized anxiety disorder, MDD, and chronic anemia presented to emergency department for ED complaining of chest pain and shortness of breath with associated nausea.  Patient reported she is having central chest pain which started tonight and not relieved with sublingual nitroglycerin at home. His daughter-in-law at the bedside reported that she is compliant with her medication.  During my evaluation, patient denies any chest pain however she is endorsing shortness of breath.  Denies any palpitation.  No other complaint at this time.  Patient's daughter-in-law reported that she received outpatient blood transfusion and Epogen shot 2 days ago after that patient has been developed acutely worsening of shortness of breath and chest pain.  Per chart review when patient was admitted in April 2024 with similar finding she has been evaluated by inpatient nephrology and patient was declined dialysis during that time.  Patient follows outpatient nephrology care with [facility] sporadically.  ED Course:  At presentation to ED patient heart rate 81, respiratory 17, elevated blood pressure 222/82 and O2 sat 96% room air.  High sensitive troponin elevated 574 (per chart review patient baseline remains in between [phone]). EKG showed normal sinus rhythm heart rate 77.  There is no ST and T wave abnormality. Elevated BNP 879. CBC WBC 1.7, RBC 2.29, hemoglobin 8.3, hematocrit  26 and platelet 222. BMP showed sodium 140, potassium 4.6, chloride 100, low bicarb 20, blood glucose 122, BUN 97, creatinine 6.9, calcium 9.1, GFR 6, anion gap of 20.  Chest x-ray showed worsening congestive heart failure with edema.  In the ED patient received Lasix 80 mg IV.  Started on nitro drip.  Received hydralazine 10 mg IV once.  Hospitalist has been consulted for further evaluation and management.   Review of Systems:  Review of Systems  Constitutional:  Positive for malaise/fatigue.  Respiratory:  Positive for shortness of breath. Negative for cough, sputum production and wheezing.   Cardiovascular:  Positive for chest pain, orthopnea and PND. Negative for palpitations and leg swelling.  Gastrointestinal:  Negative for abdominal pain, constipation, diarrhea, nausea and vomiting.  Musculoskeletal:  Negative for myalgias.  Neurological:  Negative for dizziness and headaches.  Psychiatric/Behavioral:  The patient is not nervous/anxious.     Past Medical History:  Diagnosis Date   Anemia    Anxiety    takes Ativan prn   Blood transfusion without reported diagnosis 09/15/12   2 units Prbc's   Broken ribs    Chronic back pain    CKD (chronic kidney disease), stage IV (HCC)    Constipation    related to pain meds   COPD (chronic obstructive pulmonary disease) (HCC) 08/10/2012   denies   Depression    Gastrostomy in place [facility])    removed   GERD (gastroesophageal reflux disease)    takes Zantac daily   Headache(784.0)    Hiatal hernia 08/10/2012   History of radiation therapy 10/17/12-11/25/12   supraglottic larynx,high risk neck tumor bed 5880 cGy/28 sessions, high risk lymph node  tumor bed 5600 cGy/20 sessions, mod risk lymph node tumor bed 5040 cGy/20 sessions   Hypercholesteremia    takes Pravastatin daily   Hypertension    takes Tribenzor and Atenolol daily   Insomnia    takes Amitriptyline daily   Nausea    takes Zofran prn   PAD (peripheral artery disease)  (HCC)    noninvasive imaging in 2016   Pneumonia    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 no residual   Uterine cancer [facility])     Past Surgical History:  Procedure Laterality Date   ABDOMINAL SURGERY     r/t uterine carcinoma   APPENDECTOMY     DIRECT LARYNGOSCOPY N/A 05/22/2014   Procedure: DIRECT LARYNGOSCOPY WITH ESOPHAGEAL DILATION;  Surgeon: Osborn Coho, MD;  [facility];  Service: ENT;  Laterality: N/A;   ESOPHAGOSCOPY WITH DILITATION N/A 05/29/2015   Procedure: ESOPHAGOSCOPY WITH ESOPHAGEAL DILITATION;  Surgeon: Osborn Coho, MD;  Location: [facility] OR;  Service: ENT;  Laterality: N/A;   Gastrostomy Tube removed   2013   GASTROSTOMY W/ FEEDING TUBE  12   HERNIA REPAIR     child   LARYNGETOMY  08/31/2012   Procedure: LARYNGECTOMY;  Surgeon: Osborn Coho, MD;  Location: [facility] OR;  Service: ENT;  Laterality: N/A;   LARYNGOSCOPY  08/10/2012   Procedure: LARYNGOSCOPY;  Surgeon: Osborn Coho, MD;  Location: [facility] ORS;  Service: ENT;  Laterality: N/A;  with biopsy   RADICAL NECK DISSECTION  08/31/2012   Procedure: RADICAL NECK DISSECTION;  Surgeon: Osborn Coho, MD;  Location: [facility] OR;  Service: ENT;  Laterality: Bilateral;   TRACHEAL ESOPHOGEAL PUNCTURE WITH REPAIR STOMA N/A 09/08/2013   Procedure: TRACHEAL ESOPHOGEAL PUNCTURE WITH PLACEMENT OF  PROVOX PROSTHESIS ;  Surgeon: Osborn Coho, MD;  Location: [facility] OR;  Service: ENT;  Laterality: N/A;   TRACHEOSTOMY TUBE PLACEMENT  08/10/2012   Procedure: TRACHEOSTOMY;  Surgeon: Osborn Coho, MD;  Location: [facility] ORS;  Service: ENT;  Laterality: N/A;     reports that she quit smoking about 10 years ago. Her smoking use included cigarettes. She started smoking about 60 years ago. She has a 12.5 pack-year smoking history. She has been exposed to tobacco smoke. She has never used smokeless tobacco. She reports that she does not currently use alcohol. She  reports that she does not use drugs.  Allergies  Allergen Reactions   Xyzal [Levocetirizine Dihydrochloride] Itching   Augmentin [Amoxicillin-Pot Clavulanate] Diarrhea and Nausea And Vomiting   Tribenzor [Olmesartan-Amlodipine-Hctz] Other (See Comments)    "Hurt the kidneys"    Family History  Problem Relation Age of Onset   Throat cancer Father    Brain cancer Brother    CAD Neg Hx     Prior to Admission medications   Medication Sig Start Date End Date Taking? Authorizing Provider  acetaminophen-codeine (TYLENOL #3) 300-30 MG tablet TAKE 1 TABLET BY MOUTH EVERY 6 HOURS AS NEEDED 07/26/23   Myrlene Broker, MD  aspirin EC 81 MG tablet Take 1 tablet (81 mg total) by mouth daily. Swallow whole. 10/31/22   Regalado, Belkys A, MD  Camphor-Menthol-Methyl Sal (SALONPAS EX) Apply 1 patch topically as needed (pain).    [provider]  carvedilol (COREG) 25 MG tablet TAKE 1 TABLET (25 MG TOTAL) BY MOUTH TWICE A DAY WITH MEALS 06/18/23   Myrlene Broker, MD  cefpodoxime (VANTIN) 200 MG tablet Take 1  tablet (200 mg total) by mouth daily. 08/20/23   Henson, Vickie L, NP-C  cilostazol (PLETAL) 100 MG tablet TAKE 1 TABLET BY MOUTH TWICE A DAY Patient taking differently: Take 100 mg by mouth 2 (two) times daily. 01/18/23   Myrlene Broker, MD  cloNIDine (CATAPRES - DOSED IN MG/24 HR) 0.1 mg/24hr patch Place 1 patch (0.1 mg total) onto the skin once a week. 09/18/22   Dorcas Carrow, MD  cyclobenzaprine (FLEXERIL) 5 MG tablet TAKE 0.5 TABLETS (2.5 MG TOTAL) BY MOUTH AT BEDTIME AS NEEDED FOR MUSCLE SPASMS. 08/27/23   Myrlene Broker, MD  diphenoxylate-atropine (LOMOTIL) 2.5-0.025 MG tablet TAKE 1 TABLET BY MOUTH DAILY AS NEEDED FOR DIARRHEA OR LOOSE STOOLS. 06/18/23   Crain, Whitney L, PA  DULoxetine (CYMBALTA) 30 MG capsule Take 1 capsule (30 mg total) by mouth daily. 11/17/22   Myrlene Broker, MD  ELIQUIS 2.5 MG TABS tablet TAKE 1 TABLET BY MOUTH TWICE A DAY 03/26/23    Myrlene Broker, MD  furosemide (LASIX) 40 MG tablet Take 2 tablets (80 mg total) by mouth 2 (two) times daily. For increase edema Patient taking differently: Take 40-80 mg by mouth 2 (two) times daily. For increase edema 03/10/23   Myrlene Broker, MD  guaiFENesin (MUCINEX) 600 MG 12 hr tablet Take 2 tablets (1,200 mg total) by mouth 2 (two) times daily. 10/31/22   Regalado, Belkys A, MD  hydrALAZINE (APRESOLINE) 100 MG tablet Take 1 pill 3 times a day (total dose 125 mg with 25 mg tablet) Patient taking differently: Take 100 mg by mouth 3 (three) times daily. Take 100 mg 3 times a day (total dose 125 mg with 25 mg tablet) 03/10/23   Myrlene Broker, MD  hydrALAZINE (APRESOLINE) 25 MG tablet Give 1 pill TID (1 of 100 mg and 1 of 25 mg tablets 3 times a day for total dose 125 mg TID) Hold if SBP less than 120 Patient taking differently: Take 25 mg by mouth 3 (three) times daily. Take 25 mg 3 times daily. Take with 100 mg to equal 125 mg 03/10/23   Myrlene Broker, MD  ipratropium-albuterol (DUONEB) 0.5-2.5 (3) MG/3ML SOLN Take 3 mLs by nebulization 2 (two) times daily. 10/31/22   Regalado, Belkys A, MD  isosorbide mononitrate (IMDUR) 60 MG 24 hr tablet TAKE 1 TABLET BY MOUTH EVERY DAY 06/02/23   Myrlene Broker, MD  levothyroxine (SYNTHROID) 50 MCG tablet TAKE 1 TABLET BY MOUTH EVERY DAY BEFORE BREAKFAST 07/26/23   Myrlene Broker, MD  LORazepam (ATIVAN) 1 MG tablet TAKE 1 TABLET BY MOUTH 2 TIMES DAILY AS NEEDED FOR ANXIETY. Patient taking differently: Take 1 mg by mouth 2 (two) times daily. 03/01/23   Myrlene Broker, MD  minoxidil (LONITEN) 2.5 MG tablet Take 1 tablet (2.5 mg total) by mouth 2 (two) times daily. 10/23/22   Myrlene Broker, MD  mirtazapine (REMERON) 15 MG tablet TAKE 1 TABLET BY MOUTH EVERYDAY AT BEDTIME Patient taking differently: Take 15 mg by mouth at bedtime. 03/22/23   Myrlene Broker, MD  nitroGLYCERIN (NITROSTAT) 0.4 MG SL tablet  Place 1 tablet (0.4 mg total) under the tongue every 5 (five) minutes x 3 doses as needed for chest pain. 10/31/22   Regalado, Belkys A, MD  omeprazole (PRILOSEC) 40 MG capsule Take 1 capsule (40 mg total) by mouth daily. 03/10/23   Myrlene Broker, MD  ondansetron (ZOFRAN) 40 MG/20ML SOLN injection Inject 2 mL intramuscularly every 6  hours as needed for nausea or vomiting 08/13/23   Myrlene Broker, MD  ondansetron (ZOFRAN-ODT) 8 MG disintegrating tablet Take 1 tablet (8 mg total) by mouth every 8 (eight) hours as needed for nausea or vomiting. 07/12/23   Gwenlyn Fudge, FNP  rosuvastatin (CRESTOR) 20 MG tablet TAKE 1 TABLET BY MOUTH EVERY DAY 02/18/23   Myrlene Broker, MD  sodium bicarbonate 650 MG tablet Take 650 mg by mouth 3 (three) times daily. 10/01/22   [provider]  SYRINGE-NEEDLE, DISP, 3 ML (B-D INTEGRA SYRINGE) 23G X 1" 3 ML MISC Use with zofran injections 03/10/23   Myrlene Broker, MD  levocetirizine (XYZAL) 5 MG tablet TAKE 1 TABLET(5 MG) BY MOUTH EVERY EVENING 02/21/20 06/10/20  Etta Grandchild, MD     Physical Exam: Vitals:   08/30/23 0230 08/30/23 0245 08/30/23 0254 08/30/23 0257  BP: (!) 181/64 (!) 190/74 (!) 178/69 (!) 171/72  Pulse: 74 79 76 75  Resp: 20  (!) 22 (!) 22  Temp:      TempSrc:      SpO2: 94% 96% 94% 96%  Weight:      Height:        Physical Exam Constitutional:      General: She is not in acute distress.    Appearance: She is ill-appearing.  Eyes:     Pupils: Pupils are equal, round, and reactive to light.  Cardiovascular:     Rate and Rhythm: Normal rate and regular rhythm.     Heart sounds: Normal heart sounds.     No systolic murmur is present.  Pulmonary:     Breath sounds: Examination of the right-middle field reveals rales. Examination of the left-middle field reveals rales. Examination of the right-lower field reveals rales. Examination of the left-lower field reveals rales. Rales present. No wheezing or  rhonchi.  Chest:     Chest wall: No tenderness or edema.  Abdominal:     General: Bowel sounds are normal. There is no abdominal bruit.     Palpations: Abdomen is soft.  Musculoskeletal:     Right lower leg: Edema present.     Left lower leg: Edema present.     Comments: Trace lower extremities edema  Skin:    General: Skin is dry.     Capillary Refill: Capillary refill takes less than 2 seconds.  Neurological:     Mental Status: She is alert and oriented to person, place, and time.      Labs on Admission: I have personally reviewed following labs and imaging studies  CBC: Recent Labs  Lab 08/26/23 1020 08/29/23 2224  WBC  --  7.7  HGB 6.8* 8.3*  HCT  --  26.5*  MCV  --  90.8  PLT  --  220   Basic Metabolic Panel: Recent Labs  Lab 08/29/23 2224  NA 140  K 4.6  CL 100  CO2 20*  GLUCOSE 122*  BUN 97*  CREATININE 6.91*  CALCIUM 9.1   GFR: Estimated Creatinine Clearance: 4.9 mL/min (A) (by C-G formula based on SCr of 6.91 mg/dL (H)). Liver Function Tests: No results for input(s): "AST", "ALT", "ALKPHOS", "BILITOT", "PROT", "ALBUMIN" in the last 168 hours. No results for input(s): "LIPASE", "AMYLASE" in the last 168 hours. No results for input(s): "AMMONIA" in the last 168 hours. Coagulation Profile: No results for input(s): "INR", "PROTIME" in the last 168 hours. Cardiac Enzymes: Recent Labs  Lab 08/29/23 2224 08/30/23 0027  TROPONINIHS 574* 475*  BNP (last 3 results) Recent Labs    10/28/22 2212 04/06/23 0946 08/30/23 0027  BNP 559.7* 850.0* 879.1*   HbA1C: No results for input(s): "HGBA1C" in the last 72 hours. CBG: No results for input(s): "GLUCAP" in the last 168 hours. Lipid Profile: No results for input(s): "CHOL", "HDL", "LDLCALC", "TRIG", "CHOLHDL", "LDLDIRECT" in the last 72 hours. Thyroid Function Tests: No results for input(s): "TSH", "T4TOTAL", "FREET4", "T3FREE", "THYROIDAB" in the last 72 hours. Anemia Panel: No results for  input(s): "VITAMINB12", "FOLATE", "FERRITIN", "TIBC", "IRON", "RETICCTPCT" in the last 72 hours. Urine analysis:    Component Value Date/Time   COLORURINE YELLOW 10/29/2022 0358   APPEARANCEUR CLEAR 10/29/2022 0358   LABSPEC 1.010 10/29/2022 0358   PHURINE 6.0 10/29/2022 0358   GLUCOSEU NEGATIVE 10/29/2022 0358   GLUCOSEU NEGATIVE 08/11/2021 0942   HGBUR NEGATIVE 10/29/2022 0358   BILIRUBINUR NEGATIVE 10/29/2022 0358   BILIRUBINUR Negative 01/19/2019 1510   KETONESUR NEGATIVE 10/29/2022 0358   PROTEINUR >=300 (A) 10/29/2022 0358   UROBILINOGEN 0.2 08/11/2021 0942   NITRITE NEGATIVE 10/29/2022 0358   LEUKOCYTESUR NEGATIVE 10/29/2022 0358    Radiological Exams on Admission: I have personally reviewed images DG Chest 2 View  Result Date: 08/29/2023 CLINICAL DATA:  Chest pain EXAM: CHEST - 2 VIEW COMPARISON:  04/12/2023 FINDINGS: Cardiac shadow is enlarged but stable. Aortic calcifications are noted. Lungs are well aerated bilaterally. Diffuse interstitial changes and mild vascular congestion are seen consistent with congestive failure and mild edema. No sizable effusion is noted. No focal confluent infiltrate is seen. IMPRESSION: Worsening CHF with edema. Electronically Signed   By: Alcide Clever M.D.   On: 08/29/2023 23:14    EKG: My personal interpretation of EKG shows:     Assessment/Plan: Principal Problem:   Volume overload Active Problems:   Paroxysmal atrial fibrillation (HCC)   Orthopnea   Coronary artery disease involving native coronary artery of native heart with refractory angina pectoris (HCC)   Chest pain   Hypertensive emergency   ESRD (end stage renal disease) (HCC)   GAD (generalized anxiety disorder)   Peripheral arterial disease (HCC)   Hypothyroidism   History of DVT (deep vein thrombosis)   Chronic anemia   History of CVA (cerebrovascular accident)   NSTEMI (non-ST elevated myocardial infarction) (HCC)   Hypertensive crisis   History of pharyngeal  cancer    Assessment and Plan: Volume overloaded Hypertensive emergency Diastolic heart failure exacerbation History of grade 2 diastolic heart failure with preserved EF 60 to 65% -Patient presenting with dyspnea, orthopnea and chest pain that has been started for last 24 hours has been progressed. -Per chart review patient is ESRD and declined dialysis in the past.  At the baseline she is volume overloaded on torsemide at home. -At presentation to ED elevated blood pressure 222/82, heart rate 75, respiratory rate 17 and O2 sat 96% room air.  Elevated BNP 879.  -First troponin 574 (patient's baseline troponin is around 472-544).  KG showed normal sinus rhythm heart rate 77 without any ST and T wave abnormality.  - Chest x-ray showed worsening congestive heart failure with edema. -On physical exam patient has trace bilateral lower extremities edema.  Lung exam showed bilateral lower lung field rales. -Echo from 04/03/2023 showed grade 2 diastolic heart failure with preserved EF 60 to 65%.  EF has been improved from 40% since 2023. -With concern for hypertensive emergency and chest pain in the ED patient has been started on nitro drip.  At bedside patient was maxed  on nitro drip even with the systolic blood pressure elevated to 210. -Consulted cardiology Dr. Hulan Saas.  Per cardiology volume overloaded secondary from ESRD as well as CHF exacerbation.  Recommended to continue nitro drip, give Lasix bolus 120 mg and continue Lasix drip 20 mL/h.  Per cardiology elevated troponin secondary to demand ischemia in the setting of CHF exacerbation.  Recommended resume home blood pressure regimen, continue aspirin and Eliquis.  Also if with Lasix drip if pt does not have any satisfactory urine output recommended to add metolazone or Diuril and recommended to consult nephrology as well. - Plan to continue nitro drip for next 24 hours.  Goal to gradually decrease blood pressure over next 24 hours. - Lasix bolus 120  mg in the ED followed by continue Lasix 20 mg/h for next 24-hours. -Continue Coreg 25 mg twice daily, clonidine 0.1 mg transdermal patch weekly and hydralazine 100 mg 3 times daily. -Continue to monitor improvement of blood pressure. - Continue daily weight and strict I's/O. -Continue pulse ox and supplemental oxygen to keep O2 sat 92%. - Continue cardiac monitoring - Admitting patient to progressive unit. -Appreciate cardiology input.  Will follow-up with cardiology for further recommendation.   Chest pain secondary to elevated blood pressure Elevated troponin-due to demand ischemia -First troponin 574 (patient's baseline troponin is around 472-544).  KG showed normal sinus rhythm heart rate 77 without any ST and T wave abnormality. -Patient is complaining about constant substernal chest pain with associated shortness of breath -Elevated troponin due to demand ischemia/NSTMI in context to volume overload from advanced CKD and congestive heart failure. -Plan to continue aspirin 81 mg daily, Crestor 20 mg daily.  Patient is already on Eliquis 2.5 mg daily due to paroxysmal atrial fibrillation. -Avoiding morphine, fentanyl, tramadol as GFR is only 6.  Avoiding pain medications to prevent acute metabolic encephalopathy, respiratory depression and altered mentation. -Continue nitro drip for management of chest pain. -Checking serial troponin.  Will do EKG as needed.  Volume overloaded ESRD not on dialysis -Patient is ESRD GFR has been progressed 8 to 6 over the course of last 4 months.  Creatinine has been progressed 4.9 to 6.9 over the last few months.  Renal function has been progressed.  At bedside evaluated patient declining dialysis.  Per chart review when patient was admitted in April 2024 history of for dialysis and she declined.  Also she follows outpatient nephrology for management of ESRD. - Unclear she has some AKI at this point unknown.  Creatinine was 6.41 month ago now  6.9. -Continue to monitor renal function, and monitor urine output as patient is on Lasix. - Consulted nephrology Dr. Signe Colt for further evaluation. - Will adjust medication renally.  Paroxysmal atrial fibrillation -Patient's CHA2DS2-VASc is 2 score 8. -Continue Coreg 25 mg twice daily and Eliquis 2.5 mg twice daily.  History of CAD -Continue aspirin and Crestor.  Peripheral arterial disease -Continue cilostazol 100 mg twice daily.  History of CVA -Continue aspirin and Crestor  Anemia of chronic disease due to ESRD - Hemoglobin 8.3 and hematocrit 26.  Patient received 1 unit blood transfusion 4 days ago.  Also she is getting Epogen shot monthly at nephrology clinic. - Continue to monitor CBC.  Chronic metabolic acidosis due to ESRD BMP showed low bicarb 20. -Continue bicarb 650 mg 3 times daily.  Generalized anxiety disorder - Continue Cymbalta 30 mg daily and and decreasing dose of Ativan 0.5 mg twice daily as needed in the setting of ESRD.  Hypothyroidism -  Continue levothyroxine 50 mcg daily  History of DVT - Continue Eliquis 2.5 mg daily  History of pharyngeal cancer s/p tracheostomy now has a ostomy - Verified with patient's family at the bedside that she is able to take medications by mouth.  At home she is on regular diet.   DVT prophylaxis:  Eliquis Code Status:  DNR/DNI(Do NOT Intubate).  Verified with patient's daughter-in-law at bedside.  Patient also has DNR form on the chart. Diet: Renal diet fluid restriction 1.2 L/day and salt restriction 2 g/day Family Communication: Updated family at the bedside Disposition Plan: Need to discuss further goal of care with patient's family and patient as well in presence of informal Spanish interpreter. Consults: Nephrology and cardiology Admission status:   Inpatient, progressive unit  Severity of Illness: The appropriate patient status for this patient is INPATIENT. Inpatient status is judged to be reasonable and  necessary in order to provide the required intensity of service to ensure the patient's safety. The patient's presenting symptoms, physical exam findings, and initial radiographic and laboratory data in the context of their chronic comorbidities is felt to place them at high risk for further clinical deterioration. Furthermore, it is not anticipated that the patient will be medically stable for discharge from the hospital within 2 midnights of admission.   * I certify that at the point of admission it is my clinical judgment that the patient will require inpatient hospital care spanning beyond 2 midnights from the point of admission due to high intensity of service, high risk for further deterioration and high frequency of surveillance required.[name]    Tereasa Coop, MD [facility]  How to contact the [facility] Attending or Consulting provider 7A - 7P or covering provider during after hours 7P -7A, for this patient.  Check the care team in [facility] and look for a) attending/consulting [facility] provider listed and b) the [facility] team listed Log into [email or web address] and use [place]'s universal password to access. If you do not have the password, please contact the hospital operator. Locate the [facility] provider you are looking for under [facility] and page to a number that you can be directly reached. If you still have difficulty reaching the provider, please page the [facility] (Director on Call) for the Hospitalists listed on amion for assistance.  08/30/2023, 3:44 AM

## 2023-08-30 NOTE — Consult Note (Signed)
Cardiology Consultation:   Patient ID: [name] MRN: [id number]; DOB: 19-Jan-1946  Admit date: 08/29/2023 Date of Consult: 08/30/2023  Primary Care Provider: Myrlene Broker, MD [facility] Cardiologist: Ann Miss, MD  [facility] Electrophysiologist:  None    Patient Profile:   [name] is a 77 y.o. female with a history of head/neck cancer s/p laryngectomy with stoma,, atrial fibrillation, HTN, anxiety, depression, HLD, PAD, COPD, chronic pain, dysphagia, CKD stage V, GERD, hypothyroidism, who is being seen for HTN emergency, CHF, and elevated troponins at the request of Dr [name].  History of Present Illness:   [name] was at home this evening, when she started having central chest pain and shortness of breath with associated nausea.  She took sublingual nitroglycerin, without improvement.  For this, she presented to the emergency room.  Upon ED arrival, blood pressure was elevated to 222/82, heart rate 81, respiratory rate 17, satting 96% on room air.  Troponins trended from 574-475 (per review typically range from 470 518 4063).  BNP 879 (per chart review ranging from 1 20-1263).  BMP notable for creatinine of 6.9, rising over the past 6 months and GFR of 6.  Bicarb 20 with anion gap of 20.  Chest x-ray showed pulmonary edema.  In the ED, she was given Lasix 80 mg, and started on a Lasix drip at 10 mg/h.  She is continued on apixaban and aspirin, and her home clonidine patch was applied.  She has had similar prior presentations.  She was last admitted in April 2020 for similar findings, and was evaluated by inpatient nephrology.  The patient declined dialysis during that time.  She follows outpatient with nephrology at [name] sporadically.  She has not been considered a candidate for cardiac cath due to 84 not on dialysis.  This discussion has happened multiple times.  Prior CT chest had some significant aortic calcifications along with  three-vessel coronary artery calcifications.  Past Medical History:  Diagnosis Date   Anemia    Anxiety    takes Ativan prn   Blood transfusion without reported diagnosis 09/15/12   2 units Prbc's   Broken ribs    Chronic back pain    CKD (chronic Shaw disease), stage IV (HCC)    Constipation    related to pain meds   COPD (chronic obstructive pulmonary disease) (HCC) 08/10/2012   denies   Depression    Gastrostomy in place [facility])    removed   GERD (gastroesophageal reflux disease)    takes Zantac daily   Headache(784.0)    Hiatal hernia 08/10/2012   History of radiation therapy 10/17/12-11/25/12   supraglottic larynx,high risk neck tumor bed 5880 cGy/28 sessions, high risk lymph node tumor bed 5600 cGy/20 sessions, mod risk lymph node tumor bed 5040 cGy/20 sessions   Hypercholesteremia    takes Pravastatin daily   Hypertension    takes Tribenzor and Atenolol daily   Insomnia    takes Amitriptyline daily   Nausea    takes Zofran prn   PAD (peripheral artery disease) (HCC)    noninvasive imaging in 2016   Pneumonia    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 no residual   Uterine cancer [name])     Past Surgical History:  Procedure Laterality Date   ABDOMINAL SURGERY     r/t uterine carcinoma   APPENDECTOMY     DIRECT LARYNGOSCOPY N/A 05/22/2014  Procedure: DIRECT LARYNGOSCOPY WITH ESOPHAGEAL DILATION;  Surgeon: Osborn Coho, MD;  Location: [facility] OR;  Service: ENT;  Laterality: N/A;   ESOPHAGOSCOPY WITH DILITATION N/A 05/29/2015   Procedure: ESOPHAGOSCOPY WITH ESOPHAGEAL DILITATION;  Surgeon: Osborn Coho, MD;  Location: [facility] OR;  Service: ENT;  Laterality: N/A;   Gastrostomy Tube removed   2013   GASTROSTOMY W/ FEEDING TUBE  13   HERNIA REPAIR     child   LARYNGETOMY  08/31/2012   Procedure: LARYNGECTOMY;  Surgeon: Osborn Coho, MD;  Location: [facility] OR;  Service: ENT;  Laterality: N/A;    LARYNGOSCOPY  08/10/2012   Procedure: LARYNGOSCOPY;  Surgeon: Osborn Coho, MD;  Location: [facility] ORS;  Service: ENT;  Laterality: N/A;  with biopsy   RADICAL NECK DISSECTION  08/31/2012   Procedure: RADICAL NECK DISSECTION;  Surgeon: Osborn Coho, MD;  Location: [facility] OR;  Service: ENT;  Laterality: Bilateral;   TRACHEAL ESOPHOGEAL PUNCTURE WITH REPAIR STOMA N/A 09/08/2013   Procedure: TRACHEAL ESOPHOGEAL PUNCTURE WITH PLACEMENT OF  PROVOX PROSTHESIS ;  Surgeon: Osborn Coho, MD;  Location: [facility] OR;  Service: ENT;  Laterality: N/A;   TRACHEOSTOMY TUBE PLACEMENT  08/10/2012   Procedure: TRACHEOSTOMY;  Surgeon: Osborn Coho, MD;  Location: [facility] ORS;  Service: ENT;  Laterality: N/A;     Home Medications:  Prior to Admission medications   Medication Sig Start Date End Date Taking? Authorizing Provider  acetaminophen-codeine (TYLENOL #3) 300-30 MG tablet TAKE 1 TABLET BY MOUTH EVERY 6 HOURS AS NEEDED 07/26/23  Yes Ann Broker, MD  aspirin EC 81 MG tablet Take 1 tablet (81 mg total) by mouth daily. Swallow whole. 10/31/22  Yes Regalado, Belkys A, MD  calcitRIOL (ROCALTROL) 0.25 MCG capsule Take 0.25 mcg by mouth daily. 07/09/23  Yes [provider]  carvedilol (COREG) 25 MG tablet TAKE 1 TABLET (25 MG TOTAL) BY MOUTH TWICE A DAY WITH MEALS 06/18/23  Yes Ann Broker, MD  cilostazol (PLETAL) 100 MG tablet TAKE 1 TABLET BY MOUTH TWICE A DAY Patient taking differently: Take 100 mg by mouth 2 (two) times daily. 01/18/23  Yes Ann Broker, MD  cloNIDine (CATAPRES - DOSED IN MG/24 HR) 0.1 mg/24hr patch Place 1 patch (0.1 mg total) onto the skin once a week. 09/18/22  Yes Ghimire, Lyndel Safe, MD  cyclobenzaprine (FLEXERIL) 5 MG tablet TAKE 0.5 TABLETS (2.5 MG TOTAL) BY MOUTH AT BEDTIME AS NEEDED FOR MUSCLE SPASMS. 08/27/23  Yes Ann Broker, MD  DULoxetine (CYMBALTA) 30 MG capsule Take 1 capsule (30 mg total) by mouth daily. 11/17/22  Yes Ann Broker, MD  ELIQUIS  2.5 MG TABS tablet TAKE 1 TABLET BY MOUTH TWICE A DAY 03/26/23  Yes Ann Broker, MD  furosemide (LASIX) 40 MG tablet Take 2 tablets (80 mg total) by mouth 2 (two) times daily. For increase edema Patient taking differently: Take 40-80 mg by mouth 2 (two) times daily. For increase edema 03/10/23  Yes Ann Broker, MD  guaiFENesin (MUCINEX) 600 MG 12 hr tablet Take 2 tablets (1,200 mg total) by mouth 2 (two) times daily. 10/31/22  Yes Regalado, Belkys A, MD  hydrALAZINE (APRESOLINE) 100 MG tablet Take 1 pill 3 times a day (total dose 125 mg with 25 mg tablet) Patient taking differently: Take 100 mg by mouth See admin instructions. Take along with 25mg  tablet for a total dose of 125mg  three times a day 03/10/23  Yes Ann Broker, MD  hydrALAZINE (APRESOLINE) 25 MG tablet Give 1 pill  TID (1 of 100 mg and 1 of 25 mg tablets 3 times a day for total dose 125 mg TID) Hold if SBP less than 120 Patient taking differently: Take 25 mg by mouth 3 (three) times daily. 03/10/23  Yes Ann Broker, MD  isosorbide mononitrate (IMDUR) 60 MG 24 hr tablet TAKE 1 TABLET BY MOUTH EVERY DAY 06/02/23  Yes Ann Broker, MD  levothyroxine (SYNTHROID) 50 MCG tablet TAKE 1 TABLET BY MOUTH EVERY DAY BEFORE BREAKFAST 07/26/23  Yes Ann Broker, MD  LORazepam (ATIVAN) 1 MG tablet TAKE 1 TABLET BY MOUTH 2 TIMES DAILY AS NEEDED FOR ANXIETY. Patient taking differently: Take 1 mg by mouth in the morning. May take 1 additional tablet at night for anxiety 03/01/23  Yes Ann Broker, MD  minoxidil (LONITEN) 2.5 MG tablet Take 1 tablet (2.5 mg total) by mouth 2 (two) times daily. 10/23/22  Yes Ann Broker, MD  mirtazapine (REMERON) 15 MG tablet TAKE 1 TABLET BY MOUTH EVERYDAY AT BEDTIME Patient taking differently: Take 15 mg by mouth at bedtime. 03/22/23  Yes Ann Broker, MD  nitroGLYCERIN (NITROSTAT) 0.4 MG SL tablet Place 1 tablet (0.4 mg total) under the tongue  every 5 (five) minutes x 3 doses as needed for chest pain. 10/31/22  Yes Regalado, Belkys A, MD  omeprazole (PRILOSEC) 40 MG capsule Take 1 capsule (40 mg total) by mouth daily. 03/10/23  Yes Ann Broker, MD  ondansetron [facility]) 40 MG/20ML SOLN injection Inject 2 mL intramuscularly every 6 hours as needed for nausea or vomiting 08/13/23  Yes Ann Broker, MD  ondansetron (ZOFRAN-ODT) 8 MG disintegrating tablet Take 1 tablet (8 mg total) by mouth every 8 (eight) hours as needed for nausea or vomiting. 07/12/23  Yes Deliah Boston F, FNP  rosuvastatin (CRESTOR) 20 MG tablet TAKE 1 TABLET BY MOUTH EVERY DAY 02/18/23  Yes Ann Broker, MD  sodium bicarbonate 650 MG tablet Take 650 mg by mouth 3 (three) times daily. 10/01/22  Yes [provider]  VELTASSA 8.4 g packet Take 8.4 g by mouth See admin instructions. T, Th, Sat 08/13/23  Yes [provider]  cefpodoxime (VANTIN) 200 MG tablet Take 1 tablet (200 mg total) by mouth daily. Patient not taking: Reported on 08/30/2023 08/20/23   Hetty Blend L, NP-C  diphenoxylate-atropine (LOMOTIL) 2.5-0.025 MG tablet TAKE 1 TABLET BY MOUTH DAILY AS NEEDED FOR DIARRHEA OR LOOSE STOOLS. Patient not taking: Reported on 08/30/2023 06/18/23   Crain, Alphonzo Lemmings L, PA  SYRINGE-NEEDLE, DISP, 3 ML (B-D INTEGRA SYRINGE) 23G X 1" 3 ML MISC Use with zofran injections 03/10/23   Ann Broker, MD  levocetirizine (XYZAL) 5 MG tablet TAKE 1 TABLET(5 MG) BY MOUTH EVERY EVENING 02/21/20 06/10/20  Etta Grandchild, MD    Inpatient Medications: Scheduled Meds:  apixaban  2.5 mg Oral BID   aspirin EC  81 mg Oral Daily   carvedilol  25 mg Oral BID WC   cloNIDine  0.1 mg Transdermal Weekly   hydrALAZINE  100 mg Oral TID   Continuous Infusions:  furosemide (LASIX) 200 mg in dextrose 5 % 100 mL (2 mg/mL) infusion     nitroGLYCERIN 60 mcg/min (08/30/23 0119)   PRN Meds:   Allergies:    Allergies  Allergen Reactions   Xyzal  [Levocetirizine Dihydrochloride] Itching   Augmentin [Amoxicillin-Pot Clavulanate] Diarrhea and Nausea And Vomiting   Tribenzor [Olmesartan-Amlodipine-Hctz] Other (See Comments)    "Hurt the kidneys"  Social History:   Social History   Socioeconomic History   Marital status: Widowed    Spouse name: Not on file   Number of Shaw: 4   Years of education: Not on file   Highest education level: Not on file  Occupational History    Comment: retired Sports administrator  Tobacco Use   Smoking status: Former    Current packs/day: 0.00    Average packs/day: 0.3 packs/day for 50.0 years (12.5 ttl pk-yrs)    Types: Cigarettes    Start date: 05/28/1963    Quit date: 05/27/2013    Years since quitting: 10.2    Passive exposure: Past   Smokeless tobacco: Never  Vaping Use   Vaping status: Never Used  Substance and Sexual Activity   Alcohol use: Not Currently   Drug use: No   Sexual activity: Not Currently  Other Topics Concern   Not on file  Social History Narrative   ** Merged History Encounter **       Social Determinants of Health   Financial Resource Strain: Low Risk  (10/23/2022)   Overall Financial Resource Strain (CARDIA)    Difficulty of Paying Living Expenses: Not hard at all  Food Insecurity: No Food Insecurity (04/06/2023)   Hunger Vital Sign    Worried About Running Out of Food in the Last Year: Never true    Ran Out of Food in the Last Year: Never true  Transportation Needs: No Transportation Needs (04/06/2023)   PRAPARE - Administrator, Civil Service (Medical): No    Lack of Transportation (Non-Medical): No  Physical Activity: Inactive (10/23/2022)   Exercise Vital Sign    Days of Exercise per Week: 0 days    Minutes of Exercise per Session: 0 min  Stress: No Stress Concern Present (10/23/2022)   [employer] of Occupational Health - Occupational Stress Questionnaire    Feeling of Stress : Not at all  Social Connections: Socially Isolated  (10/23/2022)   Social Connection and Isolation Panel [NHANES]    Frequency of Communication with Friends and Family: Once a week    Frequency of Social Gatherings with Friends and Family: Once a week    Attends Religious Services: More than 4 times per year    Active Member of [employer] or Organizations: No    Attends Banker Meetings: Never    Marital Status: Widowed  Intimate Partner Violence: Not At Risk (04/06/2023)   Humiliation, Afraid, Rape, and Kick questionnaire    Fear of Current or Ex-Partner: No    Emotionally Abused: No    Physically Abused: No    Sexually Abused: No    Family History:   Family History  Problem Relation Age of Onset   Throat cancer Father    Brain cancer Brother    CAD Neg Hx      ROS:  Review of Systems: [y] = yes, [ ]  = no      General: Weight gain [ ] ; Weight loss [ ] ; Anorexia [ ] ; Fatigue [ y]; Fever [ ] ; Chills [ ] ; Weakness [ ]    Cardiac: Chest pain/pressure [place] ]; Resting SOB [ y]; Exertional SOB [ ] ; Orthopnea [ ] ; Pedal Edema [ ] ; Palpitations [ ] ; Syncope [ ] ; Presyncope [ ] ; Paroxysmal nocturnal dyspnea [ ]    Pulmonary: Cough [ ] ; Wheezing [ ] ; Hemoptysis [ ] ; Sputum [ ] ; Snoring [ ]    GI: Vomiting [ ] ; Dysphagia [ ] ; Melena [ ] ; Hematochezia [ ] ;  Heartburn [ ] ; Abdominal pain [ ] ; Constipation [ ] ; Diarrhea [ ] ; BRBPR [ ]    GU: Hematuria [ ] ; Dysuria [ ] ; Nocturia [ ]  Vascular: Pain in legs with walking [ ] ; Pain in feet with lying flat [ ] ; Non-healing sores [ ] ; Stroke [ ] ; TIA [ ] ; Slurred speech [ ] ;   Neuro: Headaches [ ] ; Vertigo [ ] ; Seizures [ ] ; Paresthesias [ ] ;Blurred vision [ ] ; Diplopia [ ] ; Vision changes [ ]    Ortho/Skin: Arthritis [ ] ; Joint pain [ ] ; Muscle pain [ ] ; Joint swelling [ ] ; Back Pain [ ] ; Rash [ ]    Psych: Depression [ ] ; Anxiety [ ]    Heme: Bleeding problems [ ] ; Clotting disorders [ ] ; Anemia [ ]    Endocrine: Diabetes [ ] ; Thyroid dysfunction [ ]    Physical Exam/Data:   Vitals:   08/30/23 0124  08/30/23 0127 08/30/23 0215 08/30/23 0222  BP: (!) 196/77 (!) 202/76 (!) 188/83   Pulse: 79 76 78   Resp: (!) 22 20 (!) 24   Temp:    98.6 F (37 C)  TempSrc:    Oral  SpO2: 94% 93% 95%   Weight:      Height:        Intake/Output Summary (Last 24 hours) at 08/30/2023 0233 Last data filed at 08/30/2023 0119 Gross per 24 hour  Intake 7.66 ml  Output --  Net 7.66 ml      08/29/2023   10:19 PM 08/27/2023    8:06 AM 07/12/2023    9:28 AM  Last 3 Weights  Weight (lbs) 115 lb 115 lb 107 lb  Weight (kg) 52.164 kg 52.164 kg 48.535 kg     Body mass index is 22.46 kg/m.  General:  Well nourished, appears chronically ill HEENT: normal Lymph: no adenopathy Neck: no JVD Endocrine:  No thryomegaly Vascular: No carotid bruits; FA pulses 2+ bilaterally without bruits  Cardiac:  normal S1, S2; RRR; 2/6 systolic ejection murmur Lungs:  clear to auscultation bilaterally, no wheezing, rhonchi or rales  Abd: soft, nontender, no hepatomegaly  Ext: no edema Musculoskeletal:  No deformities, BUE and BLE strength normal and equal Skin: warm and dry  Neuro:  CNs 2-12 intact, no focal abnormalities noted Psych:  Normal affect   EKG:  The EKG was personally reviewed and demonstrates:  NSR, no st changes Telemetry:  Telemetry was personally reviewed and demonstrates:  NSR, frequent PAC  Relevant CV Studies: CT chest non-con reveal 3v CAC and aortic calcifications  Laboratory Data:  High Sensitivity Troponin:   Recent Labs  Lab 08/29/23 2224 08/30/23 0027  TROPONINIHS 574* 475*     Chemistry Recent Labs  Lab 08/29/23 2224  NA 140  K 4.6  CL 100  CO2 20*  GLUCOSE 122*  BUN 97*  CREATININE 6.91*  CALCIUM 9.1  GFRNONAA 6*  ANIONGAP 20*    No results for input(s): "PROT", "ALBUMIN", "AST", "ALT", "ALKPHOS", "BILITOT" in the last 168 hours. Hematology Recent Labs  Lab 08/26/23 1020 08/29/23 2224  WBC  --  7.7  RBC  --  2.92*  HGB 6.8* 8.3*  HCT  --  26.5*  MCV  --  90.8   MCH  --  28.4  MCHC  --  31.3  RDW  --  14.6  PLT  --  220   BNP Recent Labs  Lab 08/30/23 0027  BNP 879.1*    DDimer No results for input(s): "DDIMER" in the last 168  hours.  Radiology/Studies:  DG Chest 2 View  Result Date: 08/29/2023 CLINICAL DATA:  Chest pain EXAM: CHEST - 2 VIEW COMPARISON:  04/12/2023 FINDINGS: Cardiac shadow is enlarged but stable. Aortic calcifications are noted. Lungs are well aerated bilaterally. Diffuse interstitial changes and mild vascular congestion are seen consistent with congestive failure and mild edema. No sizable effusion is noted. No focal confluent infiltrate is seen. IMPRESSION: Worsening CHF with edema. Electronically Signed   By: Alcide Clever M.D.   On: 08/29/2023 23:14      TIMI Risk Score for Unstable Angina or Non-ST Elevation MI:   The patient's TIMI risk score is 5, which indicates a 26% risk of all cause mortality, new or recurrent myocardial infarction or need for urgent revascularization in the next 14 days.   Assessment and Plan:   Type II NSTEMI, 2/2 Hypertensive Emergency, with 3v Coronary Artery Calcifications, with Flash Pulmonary Edema, and  CKD 5 refusing dialysis Patient had a blood transfusion on Friday for anemia, and starting Saturday developed severe central chest pressure rated at an 8/10 along with shortness of breath, likely from resulting volume overload.  She has a history of CKD 5 (refusing dialysis) and prior admission for flash pulmonary edema and hypertensive emergency.  Today, her family forced her to come to the emergency room, where she was found to have a systolic blood pressure of 220 with progressive renal decline with GFR now 6.  Her troponins here were 574 -> 475 she ( actually significantly lower than historically) and this represents a demand event (type II NSTEMI) in the setting of hypertensive emergency.  Her CT chest noncontrast showed three-vessel coronary artery calcifications, but she is unable to  have a heart catheterization performed (extensive discussion previously) given CKD 5 and her refusal of dialysis.  In the ED, she was maxed out on the nitro drip, but remains hypertensive with systolic blood pressures in the 200s with continued chest pain.  She is due for all of her home blood pressure medications.  She was given Lasix 80 mg, but without any urine production yet.  Chest x-ray shows pulmonary edema, and on examination she has facial edema and mild chest edema (not in legs), and would benefit from very aggressive diuresis if she can produce some urine.  I had extensive discussion with the family and the patient introducing options including long-term IHD, peritoneal dialysis, and hospice/comfort measures.  They are going to think about all these things, while we aggressively treat her hypertensive emergency. -Continue home apixaban -Continue aspirin -No indication for heparin drip as this represents a type II NSTEMI, no indications for P2 Y12 inhibitor -Aggressive diuresis: Status post Lasix 80, recommend additional 40 mg IV dose, and increasing furosemide drip from 10 to 20 mg/h -If no urine in the next 4 hours, would recommend adjuvant diuretics with metolazone or Diuril -Continue nitro drip maxed out currently -Would restart all of her home blood pressure medications with goal systolic blood pressure reduction at least 20% in the next 1-2 hours. -Continue telemetry and continuous pulse oximetry      For questions or updates, please contact [facility] Please consult [email or web address] for contact info under     Signed, Freddy Finner, MD  08/30/2023 2:33 AM

## 2023-08-30 NOTE — Progress Notes (Signed)
Ring and bracelet removed from patient and given to son to take home.

## 2023-08-30 NOTE — ED Notes (Signed)
Transported to admit floor by ED resource RN.

## 2023-08-30 NOTE — Consult Note (Addendum)
Palliative Care Consult Note                                  Date: 08/30/2023   Patient Name: [name]  DOB: 1946-07-29  MRN: [id number]  Age / Sex: 77 y.o., female  PCP: Ann Broker, MD Referring Physician: [name], MD  Reason for Consultation: Establishing goals of care  HPI/Patient Profile: 77 y.o. female  with past medical history of neck cancer status post laryngectomy now has ostomy, CKD stage V, history of CVA, history of DVT, essential hypertension, hypothyroidism, paroxysmal atrial fibrillation,  peripheral arterial disease, coronary artery disease generalized anxiety disorder, MDD, and chronic anemia admitted on 08/29/2023 with chest pain, shortness of breath, and nausea.   Past Medical History:  Diagnosis Date   Anemia    Anxiety    takes Ativan prn   Blood transfusion without reported diagnosis 09/15/12   2 units Prbc's   Broken ribs    Chronic back pain    CKD (chronic kidney disease), stage IV (HCC)    Constipation    related to pain meds   COPD (chronic obstructive pulmonary disease) (HCC) 08/10/2012   denies   Depression    Gastrostomy in place [facility])    removed   GERD (gastroesophageal reflux disease)    takes Zantac daily   Headache(784.0)    Hiatal hernia 08/10/2012   History of radiation therapy 10/17/12-11/25/12   supraglottic larynx,high risk neck tumor bed 5880 cGy/28 sessions, high risk lymph node tumor bed 5600 cGy/20 sessions, mod risk lymph node tumor bed 5040 cGy/20 sessions   Hypercholesteremia    takes Pravastatin daily   Hypertension    takes Tribenzor and Atenolol daily   Insomnia    takes Amitriptyline daily   Nausea    takes Zofran prn   PAD (peripheral artery disease) (HCC)    noninvasive imaging in 2016   Pneumonia    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 no residual   Uterine  cancer (HCC)     Subjective:   I have reviewed medical records including EPIC notes, labs and imaging, received nursing report, assessed the patient and then spoke with patient and son's (by phone) to give an update and schedule a goals of care meeting. Met with the patient, and her sons [name] and [name] (by phone) with an interpreter [name]) present to discuss diagnosis prognosis, GOC, EOL wishes, disposition and options. Dr. Thedore Shaw was present to provide information on disease course and prognosis. Son used as Equities trader with [name] to verify and assist as needed.  I introduced Palliative Medicine as specialized medical care for people living with serious illness. It focuses on providing relief from symptoms and stress of a serious illness. The goal is to improve quality of life for both the patient and the family.  Today's Discussion: The patient and her children have a good understanding of her chronic illnesses. She has been living with end stage renal disease for a while. She lives with her son [name] and his  wife helps her manage her medications. Last night when she reported the shortness of breath and chest pain they called EMS and were scared to not transport her to the ED.  She is independent in her ADLs and most IADLs. She does have weakness and fatigue and usually spends time in bed for this reason. She does have a very good appetite and cooks for herself.  We discussed that her goals of care would determine steps moving forward. She wants to live as long as she can comfortably. The patient's sons discussed trying to manage her care at home and avoiding the hospital as much as possible moving forward. We discussed options including transitioning to home hospice. She is not yet ready for this and would like to continue to treat the treatable.   We discussed outpatient palliative as an option for help with her symptom management such as her nausea and vomiting. This would allow her to continue  to treat the treatable but provides extra limited support. We also discussed that she could transition to hospice if she decides to move to a comfort path.   Discussed the importance of continued conversation with family and the medical providers regarding overall plan of care and treatment options, ensuring decisions are within the context of the patient's values and GOCs.  Questions and concerns were addressed. Hard Choices booklet left for review. The family was encouraged to call with questions or concerns. PMT will continue to support holistically.  Review of Systems  Unable to perform ROS   Objective:   Primary Diagnoses: Present on Admission:  NSTEMI (non-ST elevated myocardial infarction) (HCC)  Orthopnea  Paroxysmal atrial fibrillation (HCC)  Peripheral arterial disease (HCC)  Chronic anemia  GAD (generalized anxiety disorder)  Hypothyroidism   Physical Exam Vitals reviewed.  Neck:     Trachea: Tracheostomy present.  Cardiovascular:     Rate and Rhythm: Normal rate.  Pulmonary:     Effort: Pulmonary effort is normal.  Skin:    General: Skin is warm and dry.  Neurological:     Mental Status: She is alert and oriented to person, place, and time.  Psychiatric:        Mood and Affect: Mood normal.        Behavior: Behavior normal.     Vital Signs:  BP (!) 173/65   Pulse 73   Temp 98.1 F (36.7 C) (Oral)   Resp 18   Ht 5' (1.524 m)   Wt 52.2 kg   SpO2 94%   BMI 22.46 kg/m   Palliative Assessment/Data: 60-70%    Advanced Care Planning:   Existing Vynca/ACP Documentation: DNR  Primary Decision Maker: PATIENT  Code Status/Advance Care Planning: DNR  Assessment & Plan:   SUMMARY OF RECOMMENDATIONS   DNR/DNI Continue to treat the treatable Continued discussion between family and patient re: goals of care Outpatient palliative care consult Continued PMT support   Discussed with: bedside RN [name], Dr. Thedore Shaw, and Dr. Sharl Shaw Time Total: 75  minutes   Thank you for allowing [place] to participate in the care of [name] PMT will continue to support holistically.   Signed by: Ann Ser, NP Palliative Medicine Team  Team Phone # [phone] (Nights/Weekends)  08/30/2023, 10:28 AM

## 2023-08-30 NOTE — Plan of Care (Signed)
  Problem: Education: Goal: Understanding of cardiac disease, CV risk reduction, and recovery process will improve Outcome: Progressing   Problem: Activity: Goal: Ability to tolerate increased activity will improve Outcome: Progressing   Problem: Activity: Goal: Risk for activity intolerance will decrease Outcome: Progressing   Problem: Nutrition: Goal: Adequate nutrition will be maintained Outcome: Progressing   Problem: Coping: Goal: Level of anxiety will decrease Outcome: Progressing

## 2023-08-30 NOTE — Progress Notes (Signed)
eLink Physician-Brief Progress Note Patient Name: [name] DOB: 1946/07/26 MRN: [id number]   Date of Service  08/30/2023  HPI/Events of Note  RN reports pt has vomited 3x since arriving to ICU at 2140.  Rec'd zofran at 2100.  Asking for another anti emetic.  QTc 448  eICU Interventions  Compazine prn ordered     Intervention Category Intermediate Interventions: Other:  Darl Pikes 08/30/2023, 11:32 PM

## 2023-08-30 NOTE — ED Notes (Signed)
Pts nitroglycerin drip max to 60 mcg/min

## 2023-08-30 NOTE — Progress Notes (Signed)
Rounding Note    Patient Name: [name] Date of Encounter: 08/30/2023  [facility] Cardiologist: Kristeen Miss, MD   Subjective   Resting in bed. States she is still having chest pain despite nitroglycerin.  Inpatient Medications    Scheduled Meds:  apixaban  2.5 mg Oral BID   aspirin EC  81 mg Oral Daily   calcitRIOL  0.25 mcg Oral Daily   carvedilol  25 mg Oral BID WC   cilostazol  100 mg Oral BID   cloNIDine  0.1 mg Transdermal Weekly   DULoxetine  30 mg Oral Daily   guaiFENesin  1,200 mg Oral BID   hydrALAZINE  100 mg Oral TID   levothyroxine  50 mcg Oral Q0600   mirtazapine  15 mg Oral QHS   pantoprazole  40 mg Oral Daily   rosuvastatin  20 mg Oral Daily   sodium bicarbonate  650 mg Oral TID   sodium chloride flush  3 mL Intravenous Q12H   Continuous Infusions:  sodium chloride     furosemide (LASIX) 200 mg in dextrose 5 % 100 mL (2 mg/mL) infusion 20 mg/hr (08/30/23 0531)   nitroGLYCERIN 70 mcg/min (08/30/23 0802)   PRN Meds: sodium chloride, acetaminophen **OR** acetaminophen, LORazepam, ondansetron **OR** ondansetron (ZOFRAN) IV, sodium chloride flush   Vital Signs    Vitals:   08/30/23 0621 08/30/23 0630 08/30/23 0741 08/30/23 0800  BP: (!) 155/62 (!) 146/56 (!) 157/69 (!) 177/72  Pulse: 73 73 73 68  Resp: (!) 21 16 (!) 22 (!) 21  Temp:   98.1 F (36.7 C)   TempSrc:   Oral   SpO2: 90% 91% 97% 94%  Weight:      Height:        Intake/Output Summary (Last 24 hours) at 08/30/2023 0813 Last data filed at 08/30/2023 0531 Gross per 24 hour  Intake 62.59 ml  Output --  Net 62.59 ml      08/29/2023   10:19 PM 08/27/2023    8:06 AM 07/12/2023    9:28 AM  Last 3 Weights  Weight (lbs) 115 lb 115 lb 107 lb  Weight (kg) 52.164 kg 52.164 kg 48.535 kg      Telemetry    SR - Personally Reviewed  Physical Exam   GEN: No acute distress.   Neck: No JVD. Trach. Cardiac: RRR, no murmurs, rubs, or gallops.  Respiratory: Clear to  auscultation bilaterally. GI: Soft, nontender, non-distended  MS: No edema; No deformity. Neuro:  Nonfocal  Psych: Normal affect   Labs    High Sensitivity Troponin:   Recent Labs  Lab 08/29/23 2224 08/30/23 0027 08/30/23 0603  TROPONINIHS 574* 475* 448*     Chemistry Recent Labs  Lab 08/29/23 2224 08/30/23 0603  NA 140 139  K 4.6 4.4  CL 100 102  CO2 20* 20*  GLUCOSE 122* 103*  BUN 97* 96*  CREATININE 6.91* 7.14*  CALCIUM 9.1 8.8*  PROT  --  5.8*  ALBUMIN  --  2.8*  AST  --  16  ALT  --  9  ALKPHOS  --  63  BILITOT  --  0.8  GFRNONAA 6* 5*  ANIONGAP 20* 17*    Lipids No results for input(s): "CHOL", "TRIG", "HDL", "LABVLDL", "LDLCALC", "CHOLHDL" in the last 168 hours.  Hematology Recent Labs  Lab 08/26/23 1020 08/29/23 2224 08/30/23 0603  WBC  --  7.7 7.5  RBC  --  2.92* 2.76*  HGB 6.8* 8.3*  7.9*  HCT  --  26.5* 25.8*  MCV  --  90.8 93.5  MCH  --  28.4 28.6  MCHC  --  31.3 30.6  RDW  --  14.6 14.6  PLT  --  220 215   Thyroid No results for input(s): "TSH", "FREET4" in the last 168 hours.  BNP Recent Labs  Lab 08/30/23 0027  BNP 879.1*    DDimer No results for input(s): "DDIMER" in the last 168 hours.   Radiology    DG Chest 2 View  Result Date: 08/29/2023 CLINICAL DATA:  Chest pain EXAM: CHEST - 2 VIEW COMPARISON:  04/12/2023 FINDINGS: Cardiac shadow is enlarged but stable. Aortic calcifications are noted. Lungs are well aerated bilaterally. Diffuse interstitial changes and mild vascular congestion are seen consistent with congestive failure and mild edema. No sizable effusion is noted. No focal confluent infiltrate is seen. IMPRESSION: Worsening CHF with edema. Electronically Signed   By: Alcide Clever M.D.   On: 08/29/2023 23:14    Cardiac Studies   No new this admission  Patient Profile     77 y.o. female ith a history of head/neck cancer s/p laryngectomy with stoma,, atrial fibrillation, HTN, anxiety, depression, HLD, PAD, COPD, chronic  pain, dysphagia, CKD stage V, GERD, hypothyroidism, who is being seen for HTN emergency, CHF, and elevated troponins at the request of Dr Janalyn Shy   Assessment & Plan    Chest pain Demand ischemia Hypertensive emergency -known 3V coronary calcification, suggests CAD. However, with her CKD stage 5 and wanting not to go on dialysis, she is not a candidate for invasive angiography -hsTn, BNP within historical ranges, hsTn flat to slightly downtrending, not consistent with ACS. -Cr 7.14 (rising) today. BUN 96. Would involve nephrology. She has made her wishes known previously that she does not want dialysis. If this decision is unchanged, I would recommend involving palliative care or even hospice depending on her trajectory.  Blood pressure slowly trending down.  Current BP meds: -carvedilol 25 mg BID (max) -clonidine 0.1 mg patch weekly--can increase this -furosemide: now on drip, has not had significant urine output -hydralazine: 100 mg TID (max dose) -imdur 60 mg daily as outpatient, currently on nitroglycerin drip -on minoxidil 2.5 mg BID as outpatient. This can cause fluid retention and angina. Will hold on this for now.  Chronic anemia Paroxysmal atrial fibrillation PAD -with recent transfusion, Hgb went from 6.8 to 8.3 post transfusion, now 7.9 -on reduced dose apixaban for pAF, with history of prior CVA. -CHA2DS2/VAS Stroke Risk Points=9  -if she has evidence of blood loss and not just chronic disease anemia, would stop aspirin and continue apixaban -with evidence of volume overload, would stop cilostazol if volume status does not improve with lasix drip. -continue rosuvastatin  For questions or updates, please contact [facility] Please consult [email or web address] for contact info under        Signed, Jodelle Red, MD  08/30/2023, 8:13 AM

## 2023-08-30 NOTE — Significant Event (Addendum)
Rapid Response Event Note   Reason for Call :  Called originally at 1932 d/t htn despite NTG gtt at . RN contacting MD and titrating gtt up.   Called again at 1954 d/t SOB. RRT to bedside.   Initial Focused Assessment:  Pt lying in bed with eyes open. She is alert and oriented, c/o SOB and ABD pain 10/10. She denies CP/dizziness.  Lungs clear and diminished. ABD soft/tender. Skin warm to touch, dry.   HR-80, BP-243/85, RR-16, SpO2-95% on humified air via laryngectomy   RT to bedside to suction laryngectomy-this helped her SOB.  A few minutes later, SpO2 dropped to 87%. Pt was again suctioned and humified air changed to .40 TC. SpO2 increased to 97%.   Interventions:  NTG at 200(max) on my arrival 20mg  hydralazine IV 1mg  dilaudid IV 10mg  norvasc PO Cleviprex gtt PCCM consulted: Tx to ICU Plan of Care:  Tx to 2M10.  Event Summary:   MD Notified: Dr. Julian Reil notified and came to bedside, PCCM consulted and Dr. Everardo All and Renae Fickle, NP came to bedside.  Call Time:1932 Arrival Time:2000 End [id number]  Terrilyn Saver, RN

## 2023-08-30 NOTE — ED Notes (Signed)
Palliative provider at bedside.

## 2023-08-30 NOTE — Progress Notes (Signed)
eLink Physician-Brief Progress Note Patient Name: [name] DOB: [birth year] MRN: [id number]   Date of Service  08/30/2023  HPI/Events of Note  53 F neck CA s/p n, CKD, HTN, hypothyroid, CAD PAF, presented with SOB and chest pain.  Managed as NSTEMI secondary to HF  eICU Interventions  Clinically improved with afterload reduction ie diuresis and BP control     Intervention Category Evaluation Type: New Patient Evaluation  Darl Pikes 08/30/2023, 11:06 PM

## 2023-08-30 NOTE — Consult Note (Signed)
NAME:  [name], MRN:  [id number], DOB:  [birth year], LOS: 0 ADMISSION DATE:  08/29/2023, CONSULTATION DATE: 9/16 REFERRING MD: Dr. Sharl Ma, CHIEF COMPLAINT: Hypertensive crisis  History of Present Illness:  77 year old female with past medical history as below, which is significant for neck cancer status post laryngectomy, and CKD stage V not interested in dialysis, hypertension, hypothyroid, PAF, and coronary artery disease.  Recent course significant for outpatient blood transfusion on 9/13.  She presented to [facility] emergency department on 9/15 with complaints of chest pain and dyspnea.  Pain was not improved with sublingual nitroglycerin.  In the emergency department she was noted to be profoundly hypertensive with blood pressure 222/82.  Imaging consistent with pulmonary edema and troponin was elevated.  She was started on nitroglycerin infusion and given Lasix before being admitted to the hospitalist service for for CHF exacerbation.  She was also resumed on her home antihypertensives.  Cardiology was consulted and recommended ongoing treatment for type II NSTEMI and aggressive diuresis.  Lasix infusion was started nephrology was consulted and in keeping with the patient's wishes of not pursuing dialysis palliative care was recommended.  Despite diuresis and antihypertensives the patient remained hypertensive with systolic blood pressures into the 240s in the evening hours of 9/16.  The patient was started on clevidipine infusion on the floor and PCCM was consulted for ICU transfer.  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   Significant Hospital Events: Including procedures, antibiotic start and stop dates in addition to other pertinent events   9/15 admit with CHF exacerbation, vol overload, NSTEMI type II, and Hypertensive crisis  Interim History / Subjective:    Objective   Blood pressure (!) 140/46, pulse 79, temperature 98.3 F (36.8 C), temperature source Oral, resp. rate (!) 23, height 5' (1.524 m), weight 52.2 kg, SpO2 97%.    FiO2 (%):  [21 %-40 %] 40 %   Intake/Output Summary (Last 24 hours) at 08/30/2023 2048 Last data filed at 08/30/2023 1816 Gross per 24 hour  Intake 122.59 ml  Output 800 ml  Net -677.41 ml   Filed Weights   08/29/23 2219  Weight: 52.2 kg    Examination: General: Elderly female in NAD HENT: [place]/AT, PERRL, laryngectomy stoma.  Lungs: Bibasilar crackles Cardiovascular: RRR, no MRG Abdomen: Soft, NT, ND Extremities: No acute deformity or ROM limitation Neuro: Alert, oriented, non-focal  Resolved Hospital Problem list     Assessment & Plan:   Hypertensive crisis - Continue coreg, amlodipine, hydralazine - Titrate Clevidipine infusion for SBP goal 180-190 mm hg - Wean nitroglycerine infusion to off.    - DC clonidine patch, convert to PO 0.2 BID. D/w pharmacy  Acute on chronic HFpEF NSTEMI type II - Telemetry - Continue lasix infusion - Per cardiology  Acute respiratory failure with hypoxia: pulmonary edema vs less likely TACO, TRALI.  - Supplemental oxygen to keep sat goal 92 or greater -   PAF - Tele - Eliquis  CKD V AKI - patient has declined dialysis - Trying diuresis -  PMT following - nephrology following  H/o DVT - Eliquis   Anemia of CKD - Trend  Hgb  NAGMA - bicarb supplementation   Best Practice (right click and "Reselect all SmartList Selections" daily)   Diet/type: Regular consistency (see orders) DVT prophylaxis: DOAC GI prophylaxis: PPI Lines: N/A Foley:  N/A Code Status:  DNR Last date of multidisciplinary goals of care discussion [ ]   Labs   CBC: Recent Labs  Lab 08/26/23 1020 08/29/23 2224 08/30/23 0603  WBC  --  7.7 7.5  HGB 6.8* 8.3* 7.9*  HCT  --  26.5* 25.8*  MCV  --  90.8 93.5  PLT  --  220 215    Basic Metabolic Panel: Recent Labs  Lab 08/29/23 2224 08/30/23 0603  NA 140 139  K 4.6 4.4  CL 100 102  CO2 20* 20*  GLUCOSE 122* 103*  BUN 97* 96*  CREATININE 6.91* 7.14*  CALCIUM 9.1 8.8*   GFR: Estimated Creatinine Clearance: 4.7 mL/min (A) (by C-G formula based on SCr of 7.14 mg/dL (H)). Recent Labs  Lab 08/29/23 2224 08/30/23 0603  WBC 7.7 7.5    Liver Function Tests: Recent Labs  Lab 08/30/23 0603  AST 16  ALT 9  ALKPHOS 63  BILITOT 0.8  PROT 5.8*  ALBUMIN 2.8*   No results for input(s): "LIPASE", "AMYLASE" in the last 168 hours. No results for input(s): "AMMONIA" in the last 168 hours.  ABG    Component Value Date/Time   HCO3 25.4 04/06/2023 0946   TCO2 23 09/27/2012 1409   ACIDBASEDEF 0.6 04/06/2023 0946   O2SAT 57.5 04/06/2023 0946     Coagulation Profile: No results for input(s): "INR", "PROTIME" in the last 168 hours.  Cardiac Enzymes: No results for input(s): "CKTOTAL", "CKMB", "CKMBINDEX", "TROPONINI" in the last 168 hours.  HbA1C: Hgb A1c MFr Bld  Date/Time Value Ref Range Status  08/07/2022 05:06 AM 5.5 4.8 - 5.6 % Final    Comment:    (NOTE) Pre diabetes:          5.7%-6.4%  Diabetes:              >6.4%  Glycemic control for   <7.0% adults with diabetes   12/27/2020 09:11 AM 5.2 4.8 - 5.6 % Final    Comment:    (NOTE) Pre diabetes:          5.7%-6.4%  Diabetes:              >6.4%  Glycemic control for   <7.0% adults with diabetes      CBG: No results for input(s): "GLUCAP" in the last 168 hours.  Review of Systems:   Bolds are positive  Constitutional: weight loss, gain, night sweats, Fevers, chills, fatigue .  HEENT: headaches, Sore throat, sneezing, nasal congestion, post nasal drip, Difficulty swallowing, Tooth/dental problems, visual complaints visual changes, ear ache CV:  chest pain, radiates:,Orthopnea, PND, swelling in lower extremities, dizziness, palpitations, syncope.  GI  heartburn, indigestion, abdominal pain, nausea, vomiting, diarrhea, change in bowel habits, loss of appetite, bloody stools.  Resp: cough, productive: , hemoptysis, dyspnea, chest pain, pleuritic.  Skin: rash or itching or icterus GU: dysuria, change in color of urine, urgency or frequency. flank pain, hematuria  MS: joint pain or swelling. decreased range of motion  Psych: change in mood or affect. depression or anxiety.  Neuro: difficulty with speech, weakness, numbness, ataxia    Past Medical History:  She,  has a past medical history of Anemia, Anxiety,  Blood transfusion without reported diagnosis (09/15/12), Broken ribs, Chronic back pain, CKD (chronic kidney disease), stage IV (HCC), Constipation, COPD (chronic obstructive pulmonary disease) (HCC) (08/10/2012), Depression, Gastrostomy in place [facility]), GERD (gastroesophageal reflux disease), Headache(784.0), Hiatal hernia (08/10/2012), History of radiation therapy (10/17/12-11/25/12), Hypercholesteremia, Hypertension, Insomnia, Nausea, PAD (peripheral artery disease) (HCC), Pneumonia, 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 (08/08/2012), Shortness of breath dyspnea, Stroke (HCC) (2011), and Uterine cancer (HCC).   Surgical History:   Past Surgical History:  Procedure Laterality Date   ABDOMINAL SURGERY     r/t uterine carcinoma   APPENDECTOMY     DIRECT LARYNGOSCOPY N/A 05/22/2014   Procedure: DIRECT LARYNGOSCOPY WITH ESOPHAGEAL DILATION;  Surgeon: Osborn Coho, MD;   Location: [facility] OR;  Service: ENT;  Laterality: N/A;   ESOPHAGOSCOPY WITH DILITATION N/A 05/29/2015   Procedure: ESOPHAGOSCOPY WITH ESOPHAGEAL DILITATION;  Surgeon: Osborn Coho, MD;  Location: [facility] OR;  Service: ENT;  Laterality: N/A;   Gastrostomy Tube removed   2013   GASTROSTOMY W/ FEEDING TUBE  12   HERNIA REPAIR     child   LARYNGETOMY  08/31/2012   Procedure: LARYNGECTOMY;  Surgeon: Osborn Coho, MD;  Location: [facility] OR;  Service: ENT;  Laterality: N/A;   LARYNGOSCOPY  08/10/2012   Procedure: LARYNGOSCOPY;  Surgeon: Osborn Coho, MD;  Location: [facility] ORS;  Service: ENT;  Laterality: N/A;  with biopsy   RADICAL NECK DISSECTION  08/31/2012   Procedure: RADICAL NECK DISSECTION;  Surgeon: Osborn Coho, MD;  Location: [facility] OR;  Service: ENT;  Laterality: Bilateral;   TRACHEAL ESOPHOGEAL PUNCTURE WITH REPAIR STOMA N/A 09/08/2013   Procedure: TRACHEAL ESOPHOGEAL PUNCTURE WITH PLACEMENT OF  PROVOX PROSTHESIS ;  Surgeon: Osborn Coho, MD;  Location: [facility] OR;  Service: ENT;  Laterality: N/A;   TRACHEOSTOMY TUBE PLACEMENT  08/10/2012   Procedure: TRACHEOSTOMY;  Surgeon: Osborn Coho, MD;  Location: [facility] ORS;  Service: ENT;  Laterality: N/A;     Social History:   reports that she quit smoking about 10 years ago. Her smoking use included cigarettes. She started smoking about 60 years ago. She has a 12.5 pack-year smoking history. She has been exposed to tobacco smoke. She has never used smokeless tobacco. She reports that she does not currently use alcohol. She reports that she does not use drugs.   Family History:  Her family history includes Brain cancer in her brother; Throat cancer in her father. There is no history of CAD.   Allergies Allergies  Allergen Reactions   Xyzal [Levocetirizine Dihydrochloride] Itching   Augmentin [Amoxicillin-Pot Clavulanate] Diarrhea and Nausea And Vomiting   Tribenzor [Olmesartan-Amlodipine-Hctz] Other (See Comments)    "Hurt the kidneys"     Home Medications   Prior to Admission medications   Medication Sig Start Date End Date Taking? Authorizing Provider  acetaminophen-codeine (TYLENOL #3) 300-30 MG tablet TAKE 1 TABLET BY MOUTH EVERY 6 HOURS AS NEEDED 07/26/23  Yes Myrlene Broker, MD  aspirin EC 81 MG tablet Take 1 tablet (81 mg total) by mouth daily. Swallow whole. 10/31/22  Yes Regalado, Belkys A, MD  calcitRIOL (ROCALTROL) 0.25 MCG capsule Take 0.25 mcg by mouth daily. 07/09/23  Yes [provider]  carvedilol (COREG) 25 MG tablet TAKE 1 TABLET (25 MG TOTAL) BY MOUTH TWICE A DAY WITH MEALS 06/18/23  Yes Myrlene Broker, MD  cilostazol (PLETAL) 100 MG tablet TAKE 1 TABLET BY MOUTH TWICE A DAY Patient taking differently: Take 100 mg by mouth 2 (two) times daily. 01/18/23  Yes  Myrlene Broker, MD  cloNIDine (CATAPRES - DOSED IN MG/24 HR) 0.1 mg/24hr patch Place 1 patch (0.1 mg total) onto the skin once a week. 09/18/22  Yes Ghimire, Lyndel Safe, MD  cyclobenzaprine (FLEXERIL) 5 MG tablet TAKE 0.5 TABLETS (2.5 MG TOTAL) BY MOUTH AT BEDTIME AS NEEDED FOR MUSCLE SPASMS. 08/27/23  Yes Myrlene Broker, MD  DULoxetine (CYMBALTA) 30 MG capsule Take 1 capsule (30 mg total) by mouth daily. 11/17/22  Yes Myrlene Broker, MD  ELIQUIS 2.5 MG TABS tablet TAKE 1 TABLET BY MOUTH TWICE A DAY 03/26/23  Yes Myrlene Broker, MD  furosemide (LASIX) 40 MG tablet Take 2 tablets (80 mg total) by mouth 2 (two) times daily. For increase edema Patient taking differently: Take 40-80 mg by mouth 2 (two) times daily. For increase edema 03/10/23  Yes Myrlene Broker, MD  guaiFENesin (MUCINEX) 600 MG 12 hr tablet Take 2 tablets (1,200 mg total) by mouth 2 (two) times daily. 10/31/22  Yes Regalado, Belkys A, MD  hydrALAZINE (APRESOLINE) 100 MG tablet Take 1 pill 3 times a day (total dose 125 mg with 25 mg tablet) Patient taking differently: Take 100 mg by mouth See admin instructions. Take along with 25mg  tablet for a total dose of 125mg  three times  a day 03/10/23  Yes Myrlene Broker, MD  hydrALAZINE (APRESOLINE) 25 MG tablet Give 1 pill TID (1 of 100 mg and 1 of 25 mg tablets 3 times a day for total dose 125 mg TID) Hold if SBP less than 120 Patient taking differently: Take 25 mg by mouth 3 (three) times daily. 03/10/23  Yes Myrlene Broker, MD  isosorbide mononitrate (IMDUR) 60 MG 24 hr tablet TAKE 1 TABLET BY MOUTH EVERY DAY 06/02/23  Yes Myrlene Broker, MD  levothyroxine (SYNTHROID) 50 MCG tablet TAKE 1 TABLET BY MOUTH EVERY DAY BEFORE BREAKFAST 07/26/23  Yes Myrlene Broker, MD  LORazepam (ATIVAN) 1 MG tablet TAKE 1 TABLET BY MOUTH 2 TIMES DAILY AS NEEDED FOR ANXIETY. Patient taking differently: Take 1 mg by mouth in the morning. May take 1 additional tablet at night for anxiety 03/01/23  Yes Myrlene Broker, MD  minoxidil (LONITEN) 2.5 MG tablet Take 1 tablet (2.5 mg total) by mouth 2 (two) times daily. 10/23/22  Yes Myrlene Broker, MD  mirtazapine (REMERON) 15 MG tablet TAKE 1 TABLET BY MOUTH EVERYDAY AT BEDTIME Patient taking differently: Take 15 mg by mouth at bedtime. 03/22/23  Yes Myrlene Broker, MD  nitroGLYCERIN (NITROSTAT) 0.4 MG SL tablet Place 1 tablet (0.4 mg total) under the tongue every 5 (five) minutes x 3 doses as needed for chest pain. 10/31/22  Yes Regalado, Belkys A, MD  omeprazole (PRILOSEC) 40 MG capsule Take 1 capsule (40 mg total) by mouth daily. 03/10/23  Yes Myrlene Broker, MD  ondansetron [facility]) 40 MG/20ML SOLN injection Inject 2 mL intramuscularly every 6 hours as needed for nausea or vomiting 08/13/23  Yes Myrlene Broker, MD  ondansetron (ZOFRAN-ODT) 8 MG disintegrating tablet Take 1 tablet (8 mg total) by mouth every 8 (eight) hours as needed for nausea or vomiting. 07/12/23  Yes Deliah Boston F, FNP  rosuvastatin (CRESTOR) 20 MG tablet TAKE 1 TABLET BY MOUTH EVERY DAY 02/18/23  Yes Myrlene Broker, MD  sodium bicarbonate 650 MG tablet Take 650 mg by mouth  3 (three) times daily. 10/01/22  Yes [provider]  VELTASSA 8.4 g packet Take 8.4 g by mouth See admin  instructions. T, Th, Sat 08/13/23  Yes [provider]  cefpodoxime (VANTIN) 200 MG tablet Take 1 tablet (200 mg total) by mouth daily. Patient not taking: Reported on 08/30/2023 08/20/23   Hetty Blend L, NP-C  diphenoxylate-atropine (LOMOTIL) 2.5-0.025 MG tablet TAKE 1 TABLET BY MOUTH DAILY AS NEEDED FOR DIARRHEA OR LOOSE STOOLS. Patient not taking: Reported on 08/30/2023 06/18/23   Maretta Bees, PA  SYRINGE-NEEDLE, DISP, 3 ML (B-D INTEGRA SYRINGE) 23G X 1" 3 ML MISC Use with zofran injections 03/10/23   Myrlene Broker, MD  levocetirizine (XYZAL) 5 MG tablet TAKE 1 TABLET(5 MG) BY MOUTH EVERY EVENING 02/21/20 06/10/20  Etta Grandchild, MD     Critical care time: 39 minutes     Joneen Roach, AGACNP-BC [facility]  See Amion for personal pager PCCM on call pager [phone] until 7pm. Please call Elink 7p-7a. [phone]  08/30/2023 9:33 PM

## 2023-08-30 NOTE — Consult Note (Signed)
Nephrology Consult   Assessment/Recommendations:   CKD5 -followed by me as an outpatient. Patient has had pre-existing wishes to not do dialysis which we have confirmed as an outpatient and as an inpatient. We have been trying to get her established with palliative care as an outpatient but family has been unable to arrange for this. -She has been exhibiting nausea/vomiting. Concern for uremia. Will certainly respect her wishes in regards to not pursuing dialysis and son is in agreement with this. Would recommend consideration of comfort care/hospice to maintain quality of life. Appreciate palliative care's assistance. Family meeting planned for today to determine GOC. -Avoid nephrotoxic medications including NSAIDs and iodinated intravenous contrast exposure unless the latter is absolutely indicated.  Preferred narcotic agents for pain control are hydromorphone, fentanyl, and methadone. Morphine should not be used. Avoid Baclofen and avoid oral sodium phosphate and magnesium citrate based laxatives / bowel preps. Continue strict Input and Output monitoring. Will monitor the patient closely with you and intervene or adjust therapy as indicated by changes in clinical status/labs   Acute on chronic diastolic CHF exacerbation -currently on lasix gtt 20mg /hr. Can augment with metolazone if no robust response -cardiology consulted -palliative care consult as above  Chest pain, type 2 NSTEMI -on NTG gtt -cardiology following  HTN emergency -on NTG and lasix gtt, resume home meds  Anemia of CKD -transfuse prn for hgb <7 -last dose of retacrit 62952 units on 9/12 (receiving q2weeks) -seems to be fe replete based on panel from 9/12  Metabolic acidosis -c/w nahco3 supplementation, CO2 acceptable  H/o pharyngeal Ca s/p trach -per primary  H/o DVT -on eliquis  Recommendations conveyed to primary service. Discussed with palliative care and with son at the bedside.   Anthony Sar [facility] 08/30/2023 11:31 AM   _____________________________________________________________________________________   History of Present Illness: [name] is a/an 77 y.o. female with a past medical history of CKD 5, pharyngeal cancer status post trach, diastolic CHF, hypertension, chronic anemia, history of CVA, history of DVT, PAD, paroxysmal A-fib, hypothyroidism, anxiety/depression who presents to [facility] with chest pain and shortness of breath, associated with nausea.  She received her last dose of Retacrit which we increased to 20,000 units on 9/12, at that time she was found to have a hemoglobin of 6.8, therefore we recommended a transfusion which she received that day. Patient's started having chest pain and SOB thereafter. Patient has been following with me in the office. She has been having nausea/vomiting chronically now which is typically controlled with this Zofran injections at home. We have been trying to get her established with palliative care but there have been some family issues so they were not able to get a family meeting set up.   Medications:  Current Facility-Administered Medications  Medication Dose Route Frequency Provider Last Rate Last Admin   0.9 %  sodium chloride infusion  250 mL Intravenous PRN Janalyn Shy, Subrina, MD       acetaminophen (TYLENOL) tablet 650 mg  650 mg Oral Q6H PRN Janalyn Shy, Subrina, MD       Or   acetaminophen (TYLENOL) suppository 650 mg  650 mg Rectal Q6H PRN Janalyn Shy, Subrina, MD       apixaban Everlene Balls) tablet 2.5 mg  2.5 mg Oral BID Sundil, Subrina, MD   2.5 mg at 08/30/23 0947   aspirin EC tablet 81 mg  81 mg Oral Daily Sundil, Subrina, MD   81 mg at 08/30/23 0540   calcitRIOL (ROCALTROL) capsule 0.25 mcg  0.25 mcg Oral Daily Sundil, Subrina, MD   0.25 mcg at 08/30/23 0946   carvedilol (COREG) tablet 25 mg  25 mg Oral BID WC Sundil, Subrina, MD   25 mg at 08/30/23 0540   cilostazol (PLETAL) tablet 100 mg  100 mg Oral BID Janalyn Shy,  Subrina, MD   100 mg at 08/30/23 0946   cloNIDine (CATAPRES - Dosed in mg/24 hr) patch 0.1 mg  0.1 mg Transdermal Weekly Sundil, Subrina, MD   0.1 mg at 08/30/23 0545   DULoxetine (CYMBALTA) DR capsule 30 mg  30 mg Oral Daily Sundil, Subrina, MD   30 mg at 08/30/23 0947   furosemide (LASIX) 200 mg in dextrose 5 % 100 mL (2 mg/mL) infusion  20 mg/hr Intravenous Continuous Sundil, Subrina, MD 10 mL/hr at 08/30/23 0531 20 mg/hr at 08/30/23 0531   guaiFENesin (MUCINEX) 12 hr tablet 1,200 mg  1,200 mg Oral BID Janalyn Shy, Subrina, MD   1,200 mg at 08/30/23 0946   hydrALAZINE (APRESOLINE) tablet 100 mg  100 mg Oral TID Tereasa Coop, MD   100 mg at 08/30/23 0947   levothyroxine (SYNTHROID) tablet 50 mcg  50 mcg Oral Q0600 Tereasa Coop, MD   50 mcg at 08/30/23 0541   LORazepam (ATIVAN) tablet 0.5 mg  0.5 mg Oral BID PRN Janalyn Shy, Subrina, MD       mirtazapine (REMERON) tablet 15 mg  15 mg Oral QHS Sundil, Subrina, MD       nitroGLYCERIN 50 mg in dextrose 5 % 250 mL (0.2 mg/mL) infusion  0-200 mcg/min Intravenous Continuous Sundil, Subrina, MD 24 mL/hr at 08/30/23 1043 80 mcg/min at 08/30/23 1043   ondansetron (ZOFRAN) tablet 4 mg  4 mg Oral Q6H PRN Janalyn Shy, Subrina, MD       Or   ondansetron [facility]) injection 4 mg  4 mg Intravenous Q6H PRN Janalyn Shy, Subrina, MD   4 mg at 08/30/23 0842   pantoprazole (PROTONIX) EC tablet 40 mg  40 mg Oral Daily Sundil, Subrina, MD   40 mg at 08/30/23 0947   rosuvastatin (CRESTOR) tablet 20 mg  20 mg Oral Daily Sundil, Subrina, MD   20 mg at 08/30/23 0539   sodium bicarbonate tablet 650 mg  650 mg Oral TID Tereasa Coop, MD   650 mg at 08/30/23 0946   sodium chloride flush (NS) 0.9 % injection 3 mL  3 mL Intravenous Q12H Sundil, Subrina, MD   3 mL at 08/30/23 0945   sodium chloride flush (NS) 0.9 % injection 3 mL  3 mL Intravenous PRN Janalyn Shy, Subrina, MD       Current Outpatient Medications  Medication Sig Dispense Refill   acetaminophen-codeine (TYLENOL #3) 300-30 MG  tablet TAKE 1 TABLET BY MOUTH EVERY 6 HOURS AS NEEDED 28 tablet 2   aspirin EC 81 MG tablet Take 1 tablet (81 mg total) by mouth daily. Swallow whole. 30 tablet 12   calcitRIOL (ROCALTROL) 0.25 MCG capsule Take 0.25 mcg by mouth daily.     carvedilol (COREG) 25 MG tablet TAKE 1 TABLET (25 MG TOTAL) BY MOUTH TWICE A DAY WITH MEALS 180 tablet 1   cilostazol (PLETAL) 100 MG tablet TAKE 1 TABLET BY MOUTH TWICE A DAY (Patient taking differently: Take 100 mg by mouth 2 (two) times daily.) 180 tablet 3   cloNIDine (CATAPRES - DOSED IN MG/24 HR) 0.1 mg/24hr patch Place 1 patch (0.1 mg total) onto the skin once a week. 4 patch 12   cyclobenzaprine (FLEXERIL) 5 MG tablet TAKE  0.5 TABLETS (2.5 MG TOTAL) BY MOUTH AT BEDTIME AS NEEDED FOR MUSCLE SPASMS. 30 tablet 1   DULoxetine (CYMBALTA) 30 MG capsule Take 1 capsule (30 mg total) by mouth daily. 90 capsule 3   ELIQUIS 2.5 MG TABS tablet TAKE 1 TABLET BY MOUTH TWICE A DAY 180 tablet 3   furosemide (LASIX) 40 MG tablet Take 2 tablets (80 mg total) by mouth 2 (two) times daily. For increase edema (Patient taking differently: Take 40-80 mg by mouth 2 (two) times daily. For increase edema) 360 tablet 3   guaiFENesin (MUCINEX) 600 MG 12 hr tablet Take 2 tablets (1,200 mg total) by mouth 2 (two) times daily. 30 tablet 0   hydrALAZINE (APRESOLINE) 100 MG tablet Take 1 pill 3 times a day (total dose 125 mg with 25 mg tablet) (Patient taking differently: Take 100 mg by mouth See admin instructions. Take along with 25mg  tablet for a total dose of 125mg  three times a day) 90 tablet 11   hydrALAZINE (APRESOLINE) 25 MG tablet Give 1 pill TID (1 of 100 mg and 1 of 25 mg tablets 3 times a day for total dose 125 mg TID) Hold if SBP less than 120 (Patient taking differently: Take 25 mg by mouth 3 (three) times daily.) 90 tablet 11   isosorbide mononitrate (IMDUR) 60 MG 24 hr tablet TAKE 1 TABLET BY MOUTH EVERY DAY 90 tablet 1   levothyroxine (SYNTHROID) 50 MCG tablet TAKE 1 TABLET  BY MOUTH EVERY DAY BEFORE BREAKFAST 90 tablet 0   LORazepam (ATIVAN) 1 MG tablet TAKE 1 TABLET BY MOUTH 2 TIMES DAILY AS NEEDED FOR ANXIETY. (Patient taking differently: Take 1 mg by mouth in the morning. May take 1 additional tablet at night for anxiety) 60 tablet 5   minoxidil (LONITEN) 2.5 MG tablet Take 1 tablet (2.5 mg total) by mouth 2 (two) times daily. 180 tablet 3   mirtazapine (REMERON) 15 MG tablet TAKE 1 TABLET BY MOUTH EVERYDAY AT BEDTIME (Patient taking differently: Take 15 mg by mouth at bedtime.) 90 tablet 1   nitroGLYCERIN (NITROSTAT) 0.4 MG SL tablet Place 1 tablet (0.4 mg total) under the tongue every 5 (five) minutes x 3 doses as needed for chest pain. 10 tablet 1   omeprazole (PRILOSEC) 40 MG capsule Take 1 capsule (40 mg total) by mouth daily. 90 capsule 3   ondansetron (ZOFRAN) 40 MG/20ML SOLN injection Inject 2 mL intramuscularly every 6 hours as needed for nausea or vomiting 40 mL 3   ondansetron (ZOFRAN-ODT) 8 MG disintegrating tablet Take 1 tablet (8 mg total) by mouth every 8 (eight) hours as needed for nausea or vomiting. 20 tablet 2   rosuvastatin (CRESTOR) 20 MG tablet TAKE 1 TABLET BY MOUTH EVERY DAY 90 tablet 3   sodium bicarbonate 650 MG tablet Take 650 mg by mouth 3 (three) times daily.     VELTASSA 8.4 g packet Take 8.4 g by mouth See admin instructions. T, Th, Sat     cefpodoxime (VANTIN) 200 MG tablet Take 1 tablet (200 mg total) by mouth daily. (Patient not taking: Reported on 08/30/2023) 5 tablet 0   diphenoxylate-atropine (LOMOTIL) 2.5-0.025 MG tablet TAKE 1 TABLET BY MOUTH DAILY AS NEEDED FOR DIARRHEA OR LOOSE STOOLS. (Patient not taking: Reported on 08/30/2023) 30 tablet 0   SYRINGE-NEEDLE, DISP, 3 ML (B-D INTEGRA SYRINGE) 23G X 1" 3 ML MISC Use with zofran injections 50 each 3     ALLERGIES Xyzal [levocetirizine dihydrochloride], Augmentin [amoxicillin-pot clavulanate], and Tribenzor [  olmesartan-amlodipine-hctz]  MEDICAL HISTORY Past Medical History:   Diagnosis Date   Anemia    Anxiety    takes Ativan prn   Blood transfusion without reported diagnosis 09/15/12   2 units Prbc's   Broken ribs    Chronic back pain    CKD (chronic kidney disease), stage IV (HCC)    Constipation    related to pain meds   COPD (chronic obstructive pulmonary disease) (HCC) 08/10/2012   denies   Depression    Gastrostomy in place [facility])    removed   GERD (gastroesophageal reflux disease)    takes Zantac daily   Headache(784.0)    Hiatal hernia 08/10/2012   History of radiation therapy 10/17/12-11/25/12   supraglottic larynx,high risk neck tumor bed 5880 cGy/28 sessions, high risk lymph node tumor bed 5600 cGy/20 sessions, mod risk lymph node tumor bed 5040 cGy/20 sessions   Hypercholesteremia    takes Pravastatin daily   Hypertension    takes Tribenzor and Atenolol daily   Insomnia    takes Amitriptyline daily   Nausea    takes Zofran prn   PAD (peripheral artery disease) (HCC)    noninvasive imaging in 2016   Pneumonia    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 no residual   Uterine cancer [facility])      SOCIAL HISTORY Social History   Socioeconomic History   Marital status: Widowed    Spouse name: Not on file   Number of children: 4   Years of education: Not on file   Highest education level: Not on file  Occupational History    Comment: retired Sports administrator  Tobacco Use   Smoking status: Former    Current packs/day: 0.00    Average packs/day: 0.3 packs/day for 50.0 years (12.5 ttl pk-yrs)    Types: Cigarettes    Start date: 05/28/1963    Quit date: 05/27/2013    Years since quitting: 10.2    Passive exposure: Past   Smokeless tobacco: Never  Vaping Use   Vaping status: Never Used  Substance and Sexual Activity   Alcohol use: Not Currently   Drug use: No   Sexual activity: Not Currently  Other Topics Concern   Not on file  Social History  Narrative   ** Merged History Encounter **       Social Determinants of Health   Financial Resource Strain: Low Risk  (10/23/2022)   Overall Financial Resource Strain (CARDIA)    Difficulty of Paying Living Expenses: Not hard at all  Food Insecurity: No Food Insecurity (04/06/2023)   Hunger Vital Sign    Worried About Running Out of Food in the Last Year: Never true    Ran Out of Food in the Last Year: Never true  Transportation Needs: No Transportation Needs (04/06/2023)   PRAPARE - Administrator, Civil Service (Medical): No    Lack of Transportation (Non-Medical): No  Physical Activity: Inactive (10/23/2022)   Exercise Vital Sign    Days of Exercise per Week: 0 days    Minutes of Exercise per Session: 0 min  Stress: No Stress Concern Present (10/23/2022)   [employer] of Occupational Health - Occupational Stress Questionnaire    Feeling of Stress : Not at all  Social Connections: Socially Isolated (10/23/2022)   Social Connection and Isolation Panel [NHANES]    Frequency of Communication with Friends and Family: Once  a week    Frequency of Social Gatherings with Friends and Family: Once a week    Attends Religious Services: More than 4 times per year    Active Member of [employer] or Organizations: No    Attends Banker Meetings: Never    Marital Status: Widowed  Intimate Partner Violence: Not At Risk (04/06/2023)   Humiliation, Afraid, Rape, and Kick questionnaire    Fear of Current or Ex-Partner: No    Emotionally Abused: No    Physically Abused: No    Sexually Abused: No     FAMILY HISTORY Family History  Problem Relation Age of Onset   Throat cancer Father    Brain cancer Brother    CAD Neg Hx      Review of Systems: 12 systems reviewed Otherwise as per HPI, all other systems reviewed and negative  Physical Exam: Vitals:   08/30/23 1030 08/30/23 1045  BP: (!) 169/69 (!) 169/71  Pulse: 73 74  Resp: 18 20  Temp:    SpO2: 94% 94%    No intake/output data recorded.  Intake/Output Summary (Last 24 hours) at 08/30/2023 1131 Last data filed at 08/30/2023 0531 Gross per 24 hour  Intake 62.59 ml  Output --  Net 62.59 ml   General: chronically ill appearing, no acute distress HEENT: anicteric sclera, oropharynx clear without lesions, trach site c/d/i CV: regular rate, normal rhythm, no murmurs, no gallops, no rubs Lungs: decreased breath sounds bibasilar, normal WOB Abd: soft, non-tender, non-distended Skin: no visible lesions or rashes Musculoskeletal: trace pitting edema b/l LEs Neuro: awake, alert  Test Results Reviewed Lab Results  Component Value Date   NA 139 08/30/2023   K 4.4 08/30/2023   CL 102 08/30/2023   CO2 20 (L) 08/30/2023   BUN 96 (H) 08/30/2023   CREATININE 7.14 (H) 08/30/2023   GFR 8.65 (LL) 03/10/2023   GLU 108 09/29/2022   CALCIUM 8.8 (L) 08/30/2023   ALBUMIN 2.8 (L) 08/30/2023   PHOS 4.8 (H) 04/11/2023     I have reviewed all relevant outside healthcare records related to the patient's kidney injury.

## 2023-08-30 NOTE — Progress Notes (Signed)
  Chart review of the cardiology note and nephrology note recommended to continue nitroglycerin drip and Lasix drip.  Patient's blood pressure  still elevated systolic is upper 200 range.  Patient urine output 800 cc in last 16 hours. - Found out that Lasix drip and nitro drip are going to expire in next 8 hours.   -Given-patient still has elevated blood pressure to upper 200 range and urine output is not satisfactory I have renewed the order of Lasix drip for next 24-hours and renewed the order of nitroglycerin refunding 24 hours.   Tereasa Coop, MD [facility] 08/30/2023, 7:00 PM

## 2023-08-30 NOTE — Progress Notes (Signed)
This RN came into patient's room.  Pt's BP is elevated despite nitroglycerin drip, lasix drip and PO medications given.  This RN contacted charge RN, rapid was called, as well as RT due to patient having difficulty breathing.  Dr Julian Reil also made aware of situation.  Mindy, RRT and Dr. Julian Reil came to bedside.  Despite medications given per MAR and max out of nitroglycerin, pt's BP remained elevated.  Pt started on Cleviprex by Mindy, RRT.  ICU providers came in to see patient and discuss patient situation with family.  Family wanting ICU transfer.  Mindy, RRT arranging placement.  Pt was also medicated for pain per MAR, but once BP has gotten better control, pt's abdominal pain is now gone.  Pt awake and oriented.  Speaks spanish, family at bedside and made aware of next steps.  Mindy, RRT remains at bedside as well.

## 2023-08-30 NOTE — Procedures (Deleted)
Bronchoscopy Procedure Note  KAORI DETHOMAS  [id number]  [birth year]  Date:08/30/23  Time:9:28 PM   Provider Performing:Jodeci Roarty Mechele Collin, MD  Procedure(s):  Flexible bronchoscopy with bronchial alveolar lavage [id number])  Indication(s) Deceased donor bronch  Consent Obtained under donor consent  Anesthesia Fentanyl   Time Out Verified patient identification, verified procedure, site/side was marked, verified correct patient position, special equipment/implants available, medications/allergies/relevant history reviewed, required imaging and test results available.   Sterile Technique Usual hand hygiene, masks, gowns, and gloves were used   Procedure Description Bronchoscope advanced through endotracheal tube and into airway.  Airways were examined down to subsegmental level with findings noted below.   Following diagnostic evaluation, BAL(s) performed in LUL and RUL with normal saline and return of 15 cc blood tinged and 20 cc cloudy blood tinged fluid, respectively  Findings:  S/p BAL of LUL, sent to [facility] for analysis S/p BAL of RUL, sent to [facility] lab for cell count and culture  Complications/Tolerance None; patient tolerated the procedure well. Chest X-ray is not needed post procedure.   EBL Minimal   Specimen(s) As above

## 2023-08-30 NOTE — ED Notes (Signed)
ED TO INPATIENT HANDOFF REPORT  ED Nurse Name and Phone #: Dahlia Client [phone]  [name] Room/Bed: 009C/009C  Code Status   Code Status: Limited: Do not attempt resuscitation (DNR) -DNR-LIMITED -Do Not Intubate/DNI   Home/SNF/Other Home Patient oriented to: self, place, time, and situation Is this baseline? Yes   Triage Complete: Triage complete  Chief Complaint Hypertensive emergency [I16.1]  Triage Note Pt BIB EMS from home for nausea and ShOB all day. Tonight started having centralized CP non-radiating. Zofran, 324 ASA and 1 nitroglycerin by family prior to EMS arrival, 2 more nitroglycerin given by EMS. Hx of end stage renal which she is not receiving treatment for.  EMS VS: 240/100 HR 76 97% RA   Allergies Allergies  Allergen Reactions   Xyzal [Levocetirizine Dihydrochloride] Itching   Augmentin [Amoxicillin-Pot Clavulanate] Diarrhea and Nausea And Vomiting   Tribenzor [Olmesartan-Amlodipine-Hctz] Other (See Comments)    "Hurt the kidneys"    Level of Care/Admitting Diagnosis ED Disposition     ED Disposition  Admit   Condition  --   Comment  Hospital Area: [facility] [[id number]]  Level of Care: Progressive [102]  Admit to Progressive based on following criteria: CARDIOVASCULAR & THORACIC of moderate stability with acute coronary syndrome symptoms/low risk myocardial infarction/hypertensive urgency/arrhythmias/heart failure potentially compromising stability and stable post cardiovascular intervention patients.  Admit to Progressive based on following criteria: Other see comments  Comments: Hypertensive emergency and volume overload.  On nitroglycerin and Lasix drip  May admit patient to [facility] or [facility] if equivalent level of care is available:: No  Covid Evaluation: Asymptomatic - no recent exposure (last 10 days) testing not required  Diagnosis: Hypertensive emergency [phone]  Admitting  Physician: Tereasa Coop [[id number]]  Attending Physician: Tereasa Coop [[id number]]  Certification:: I certify this patient will need inpatient services for at least 2 midnights  Expected Medical Readiness: 09/04/2023          B Medical/Surgery History Past Medical History:  Diagnosis Date   Anemia    Anxiety    takes Ativan prn   Blood transfusion without reported diagnosis 09/15/12   2 units Prbc's   Broken ribs    Chronic back pain    CKD (chronic kidney disease), stage IV (HCC)    Constipation    related to pain meds   COPD (chronic obstructive pulmonary disease) (HCC) 08/10/2012   denies   Depression    Gastrostomy in place [facility])    removed   GERD (gastroesophageal reflux disease)    takes Zantac daily   Headache(784.0)    Hiatal hernia 08/10/2012   History of radiation therapy 10/17/12-11/25/12   supraglottic larynx,high risk neck tumor bed 5880 cGy/28 sessions, high risk lymph node tumor bed 5600 cGy/20 sessions, mod risk lymph node tumor bed 5040 cGy/20 sessions   Hypercholesteremia    takes Pravastatin daily   Hypertension    takes Tribenzor and Atenolol daily   Insomnia    takes Amitriptyline daily   Nausea    takes Zofran prn   PAD (peripheral artery disease) (HCC)    noninvasive imaging in 2016   Pneumonia    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 no residual   Uterine cancer [facility])    Past Surgical History:  Procedure Laterality Date   ABDOMINAL SURGERY     r/t uterine carcinoma   APPENDECTOMY  DIRECT LARYNGOSCOPY N/A 05/22/2014   Procedure: DIRECT LARYNGOSCOPY WITH ESOPHAGEAL DILATION;  Surgeon: Osborn Coho, MD;  Location: [facility] OR;  Service: ENT;  Laterality: N/A;   ESOPHAGOSCOPY WITH DILITATION N/A 05/29/2015   Procedure: ESOPHAGOSCOPY WITH ESOPHAGEAL DILITATION;  Surgeon: Osborn Coho, MD;  Location: [facility] OR;  Service: ENT;  Laterality: N/A;   Gastrostomy Tube  removed   2013   GASTROSTOMY W/ FEEDING TUBE  12   HERNIA REPAIR     child   LARYNGETOMY  08/31/2012   Procedure: LARYNGECTOMY;  Surgeon: Osborn Coho, MD;  Location: [facility] OR;  Service: ENT;  Laterality: N/A;   LARYNGOSCOPY  08/10/2012   Procedure: LARYNGOSCOPY;  Surgeon: Osborn Coho, MD;  Location: [facility] ORS;  Service: ENT;  Laterality: N/A;  with biopsy   RADICAL NECK DISSECTION  08/31/2012   Procedure: RADICAL NECK DISSECTION;  Surgeon: Osborn Coho, MD;  Location: [facility] OR;  Service: ENT;  Laterality: Bilateral;   TRACHEAL ESOPHOGEAL PUNCTURE WITH REPAIR STOMA N/A 09/08/2013   Procedure: TRACHEAL ESOPHOGEAL PUNCTURE WITH PLACEMENT OF  PROVOX PROSTHESIS ;  Surgeon: Osborn Coho, MD;  Location: [facility] OR;  Service: ENT;  Laterality: N/A;   TRACHEOSTOMY TUBE PLACEMENT  08/10/2012   Procedure: TRACHEOSTOMY;  Surgeon: Osborn Coho, MD;  Location: [facility] ORS;  Service: ENT;  Laterality: N/A;     A IV Location/Drains/Wounds Patient Lines/Drains/Airways Status     Active Line/Drains/Airways     Name Placement date Placement time Site Days   Peripheral IV 08/29/23 20 G Posterior;Left Wrist 08/29/23  2223  Wrist  1   Peripheral IV 08/30/23 20 G Anterior;Proximal;Right Forearm 08/30/23  0026  Forearm  less than 1   Peripheral IV 08/30/23 20 G Posterior;Right Hand 08/30/23  0752  Hand  less than 1   Tracheostomy   Laryngectomy --  --  --  --   Tracheostomy 04/06/23  0945  --  146   Tracheostomy  Uncuffed --  --  --  --            Intake/Output Last 24 hours  Intake/Output Summary (Last 24 hours) at 08/30/2023 0755 Last data filed at 08/30/2023 0531 Gross per 24 hour  Intake 62.59 ml  Output --  Net 62.59 ml    Labs/Imaging Results for orders placed or performed during the hospital encounter of 08/29/23 (from the past 48 hour(s))  Basic metabolic panel     Status: Abnormal   Collection Time: 08/29/23 10:24 PM  Result Value Ref Range   Sodium 140 135 - 145 mmol/L   Potassium 4.6  3.5 - 5.1 mmol/L   Chloride 100 98 - 111 mmol/L   CO2 20 (L) 22 - 32 mmol/L   Glucose, Bld 122 (H) 70 - 99 mg/dL    Comment: Glucose reference range applies only to samples taken after fasting for at least 8 hours.   BUN 97 (H) 8 - 23 mg/dL   Creatinine, Ser 1.61 (H) 0.44 - 1.00 mg/dL   Calcium 9.1 8.9 - 09.6 mg/dL   GFR, Estimated 6 (L) >60 mL/min    Comment: (NOTE) Calculated using the CKD-EPI Creatinine Equation (2021)    Anion gap 20 (H) 5 - 15    Comment: Performed at [facility], [address] [place], [place] [address]  CBC     Status: Abnormal   Collection Time: 08/29/23 10:24 PM  Result Value Ref Range   WBC 7.7 4.0 - 10.5 K/uL   RBC 2.92 (L) 3.87 - 5.11  MIL/uL   Hemoglobin 8.3 (L) 12.0 - 15.0 g/dL   HCT 16.1 (L) 09.6 - 04.5 %   MCV 90.8 80.0 - 100.0 fL   MCH 28.4 26.0 - 34.0 pg   MCHC 31.3 30.0 - 36.0 g/dL   RDW 40.9 81.1 - 91.4 %   Platelets 220 150 - 400 K/uL   nRBC 0.0 0.0 - 0.2 %    Comment: Performed at [facility], [address] [place], [place] [address]  Troponin I (High Sensitivity)     Status: Abnormal   Collection Time: 08/29/23 10:24 PM  Result Value Ref Range   Troponin I (High Sensitivity) 574 (HH) <18 ng/L    Comment: CRITICAL RESULT CALLED TO, READ BACK BY AND VERIFIED WITH ROBERTS, M. PARAMEDIC @ 2315 08/29/23 JBUTLER (NOTE) Elevated high sensitivity troponin I (hsTnI) values and significant  changes across serial measurements may suggest ACS but many other  chronic and acute conditions are known to elevate hsTnI results.  Refer to the "Links" section for chest pain algorithms and additional  guidance. Performed at [facility], [address] [place], [place] [address]   Brain natriuretic peptide     Status: Abnormal   Collection Time: 08/30/23 12:27 AM  Result Value Ref Range   B Natriuretic Peptide 879.1 (H) 0.0 - 100.0 pg/mL    Comment: Performed at [facility], [address] [place], [place] [address]   Troponin I (High Sensitivity)     Status: Abnormal   Collection Time: 08/30/23 12:27 AM  Result Value Ref Range   Troponin I (High Sensitivity) 475 (HH) <18 ng/L    Comment: CRITICAL VALUE NOTED. VALUE IS CONSISTENT WITH PREVIOUSLY REPORTED/CALLED VALUE (NOTE) Elevated high sensitivity troponin I (hsTnI) values and significant  changes across serial measurements may suggest ACS but many other  chronic and acute conditions are known to elevate hsTnI results.  Refer to the "Links" section for chest pain algorithms and additional  guidance. Performed at [facility], [address], [place], [place] [address]   CBC     Status: Abnormal   Collection Time: 08/30/23  6:03 AM  Result Value Ref Range   WBC 7.5 4.0 - 10.5 K/uL   RBC 2.76 (L) 3.87 - 5.11 MIL/uL   Hemoglobin 7.9 (L) 12.0 - 15.0 g/dL   HCT 95.2 (L) 84.1 - 32.4 %   MCV 93.5 80.0 - 100.0 fL   MCH 28.6 26.0 - 34.0 pg   MCHC 30.6 30.0 - 36.0 g/dL   RDW 40.1 02.7 - 25.3 %   Platelets 215 150 - 400 K/uL   nRBC 0.0 0.0 - 0.2 %    Comment: Performed at [facility], [address] [place], [place] [address]   DG Chest 2 View  Result Date: 08/29/2023 CLINICAL DATA:  Chest pain EXAM: CHEST - 2 VIEW COMPARISON:  04/12/2023 FINDINGS: Cardiac shadow is enlarged but stable. Aortic calcifications are noted. Lungs are well aerated bilaterally. Diffuse interstitial changes and mild vascular congestion are seen consistent with congestive failure and mild edema. No sizable effusion is noted. No focal confluent infiltrate is seen. IMPRESSION: Worsening CHF with edema. Electronically Signed   By: Alcide Clever M.D.   On: 08/29/2023 23:14    Pending Labs Unresulted Labs (From admission, onward)     Start     Ordered   08/30/23 0500  Comprehensive metabolic panel  Daily,   R  08/30/23 0307   08/30/23 0500  CBC  Daily,   R      08/30/23 0307            Vitals/Pain Today's Vitals   08/30/23 0500 08/30/23 0621 08/30/23  0630 08/30/23 0741  BP: (!) 171/79 (!) 155/62 (!) 146/56 (!) 157/69  Pulse: 75 73 73 73  Resp: 18 (!) 21 16 (!) 22  Temp:    98.1 F (36.7 C)  TempSrc:    Oral  SpO2: 93% 90% 91% 97%  Weight:      Height:      PainSc:        Isolation Precautions No active isolations  Medications Medications  cloNIDine (CATAPRES - Dosed in mg/24 hr) patch 0.1 mg (0.1 mg Transdermal Patch Applied 08/30/23 0545)  apixaban (ELIQUIS) tablet 2.5 mg (2.5 mg Oral Given 08/30/23 0242)  aspirin EC tablet 81 mg (81 mg Oral Given 08/30/23 0540)  carvedilol (COREG) tablet 25 mg (25 mg Oral Given 08/30/23 0540)  hydrALAZINE (APRESOLINE) tablet 100 mg (100 mg Oral Given 08/30/23 0540)  rosuvastatin (CRESTOR) tablet 20 mg (20 mg Oral Given 08/30/23 0539)  DULoxetine (CYMBALTA) DR capsule 30 mg (has no administration in time range)  mirtazapine (REMERON) tablet 15 mg (has no administration in time range)  calcitRIOL (ROCALTROL) capsule 0.25 mcg (has no administration in time range)  levothyroxine (SYNTHROID) tablet 50 mcg (50 mcg Oral Given 08/30/23 0541)  pantoprazole (PROTONIX) EC tablet 40 mg (has no administration in time range)  sodium bicarbonate tablet 650 mg (has no administration in time range)  cilostazol (PLETAL) tablet 100 mg (has no administration in time range)  guaiFENesin (MUCINEX) 12 hr tablet 1,200 mg (1,200 mg Oral Given 08/30/23 0541)  sodium chloride flush (NS) 0.9 % injection 3 mL (3 mLs Intravenous Not Given 08/30/23 0315)  sodium chloride flush (NS) 0.9 % injection 3 mL (has no administration in time range)  0.9 %  sodium chloride infusion (has no administration in time range)  acetaminophen (TYLENOL) tablet 650 mg (has no administration in time range)    Or  acetaminophen (TYLENOL) suppository 650 mg (has no administration in time range)  ondansetron (ZOFRAN) tablet 4 mg (has no administration in time range)    Or  ondansetron (ZOFRAN) injection 4 mg (has no administration in time range)   furosemide (LASIX) 200 mg in dextrose 5 % 100 mL (2 mg/mL) infusion (20 mg/hr Intravenous New Bag/Given 08/30/23 0531)  nitroGLYCERIN 50 mg in dextrose 5 % 250 mL (0.2 mg/mL) infusion (65 mcg/min Intravenous Rate/Dose Verify 08/30/23 0745)  LORazepam (ATIVAN) tablet 0.5 mg (has no administration in time range)  morphine (PF) 4 MG/ML injection 4 mg (4 mg Intravenous Given 08/29/23 2323)  ondansetron (ZOFRAN) injection 4 mg (4 mg Intravenous Given 08/29/23 2324)  hydrALAZINE (APRESOLINE) injection 10 mg (10 mg Intravenous Given 08/30/23 0001)  furosemide (LASIX) injection 80 mg (80 mg Intravenous Given 08/30/23 0036)  furosemide (LASIX) injection 40 mg (40 mg Intravenous Given 08/30/23 0216)    Mobility walks     Focused Assessments Cardiac Assessment Handoff:  Cardiac Rhythm: Normal sinus rhythm Lab Results  Component Value Date   CKTOTAL 159 07/10/2010   CKMB 3.4 07/10/2010   TROPONINI <0.03 10/29/2015   Lab Results  Component Value Date   DDIMER >20.00 (H) 08/06/2022   Does the Patient currently have chest pain? Yes   , Pulmonary Assessment Handoff:  Lung sounds: L Breath Sounds: Clear R Breath Sounds: Clear O2 Device: Room  Air      R Recommendations: See Admitting Provider Note  Report given to:   Additional Notes: On nitroglycerin gtt and lasix gtt; trach on RA

## 2023-08-30 NOTE — ED Notes (Signed)
Pt eating breakfast with no swallow issues noted.

## 2023-08-31 DIAGNOSIS — I739 Peripheral vascular disease, unspecified: Secondary | ICD-10-CM | POA: Diagnosis not present

## 2023-08-31 DIAGNOSIS — I2489 Other forms of acute ischemic heart disease: Secondary | ICD-10-CM

## 2023-08-31 DIAGNOSIS — E8770 Fluid overload, unspecified: Secondary | ICD-10-CM

## 2023-08-31 DIAGNOSIS — Z86718 Personal history of other venous thrombosis and embolism: Secondary | ICD-10-CM

## 2023-08-31 DIAGNOSIS — I480 Paroxysmal atrial fibrillation: Secondary | ICD-10-CM | POA: Diagnosis not present

## 2023-08-31 DIAGNOSIS — Z7189 Other specified counseling: Secondary | ICD-10-CM

## 2023-08-31 DIAGNOSIS — I25112 Atherosclerotic heart disease of native coronary artery with refractory angina pectoris: Secondary | ICD-10-CM

## 2023-08-31 DIAGNOSIS — Z8673 Personal history of transient ischemic attack (TIA), and cerebral infarction without residual deficits: Secondary | ICD-10-CM

## 2023-08-31 DIAGNOSIS — I169 Hypertensive crisis, unspecified: Secondary | ICD-10-CM

## 2023-08-31 DIAGNOSIS — N186 End stage renal disease: Secondary | ICD-10-CM

## 2023-08-31 DIAGNOSIS — Z515 Encounter for palliative care: Secondary | ICD-10-CM

## 2023-08-31 DIAGNOSIS — R079 Chest pain, unspecified: Secondary | ICD-10-CM

## 2023-08-31 DIAGNOSIS — I161 Hypertensive emergency: Secondary | ICD-10-CM | POA: Diagnosis not present

## 2023-08-31 LAB — BASIC METABOLIC PANEL
Anion gap: 16 — ABNORMAL HIGH (ref 5–15)
BUN: 93 mg/dL — ABNORMAL HIGH (ref 8–23)
CO2: 24 mmol/L (ref 22–32)
Calcium: 8.8 mg/dL — ABNORMAL LOW (ref 8.9–10.3)
Chloride: 95 mmol/L — ABNORMAL LOW (ref 98–111)
Creatinine, Ser: 7.6 mg/dL — ABNORMAL HIGH (ref 0.44–1.00)
GFR, Estimated: 5 mL/min — ABNORMAL LOW (ref >60)
Glucose, Bld: 114 mg/dL — ABNORMAL HIGH (ref 70–99)
Potassium: 4.1 mmol/L (ref 3.5–5.1)
Sodium: 135 mmol/L (ref 135–145)

## 2023-08-31 LAB — COMPREHENSIVE METABOLIC PANEL
ALT: 10 U/L (ref 0–44)
AST: 18 U/L (ref 15–41)
Albumin: 3.1 g/dL — ABNORMAL LOW (ref 3.5–5.0)
Alkaline Phosphatase: 75 U/L (ref 38–126)
Anion gap: 16 — ABNORMAL HIGH (ref 5–15)
BUN: 92 mg/dL — ABNORMAL HIGH (ref 8–23)
CO2: 22 mmol/L (ref 22–32)
Calcium: 9 mg/dL (ref 8.9–10.3)
Chloride: 97 mmol/L — ABNORMAL LOW (ref 98–111)
Creatinine, Ser: 7.19 mg/dL — ABNORMAL HIGH (ref 0.44–1.00)
GFR, Estimated: 5 mL/min — ABNORMAL LOW (ref >60)
Glucose, Bld: 143 mg/dL — ABNORMAL HIGH (ref 70–99)
Potassium: 4.4 mmol/L (ref 3.5–5.1)
Sodium: 135 mmol/L (ref 135–145)
Total Bilirubin: 1.2 mg/dL (ref 0.3–1.2)
Total Protein: 6.5 g/dL (ref 6.5–8.1)

## 2023-08-31 LAB — LIPID PANEL
Cholesterol: 144 mg/dL (ref 0–200)
HDL: 46 mg/dL (ref >40)
LDL Cholesterol: 76 mg/dL (ref 0–99)
Total CHOL/HDL Ratio: 3.1 ratio
Triglycerides: 112 mg/dL (ref <150)
VLDL: 22 mg/dL (ref 0–40)

## 2023-08-31 LAB — CBC
HCT: 28.6 % — ABNORMAL LOW (ref 36.0–46.0)
Hemoglobin: 9.4 g/dL — ABNORMAL LOW (ref 12.0–15.0)
MCH: 29.9 pg (ref 26.0–34.0)
MCHC: 32.9 g/dL (ref 30.0–36.0)
MCV: 91.1 fL (ref 80.0–100.0)
Platelets: 232 10*3/uL (ref 150–400)
RBC: 3.14 MIL/uL — ABNORMAL LOW (ref 3.87–5.11)
RDW: 14.4 % (ref 11.5–15.5)
WBC: 9.3 10*3/uL (ref 4.0–10.5)
nRBC: 0 % (ref 0.0–0.2)

## 2023-08-31 LAB — GLUCOSE, CAPILLARY: Glucose-Capillary: 140 mg/dL — ABNORMAL HIGH (ref 70–99)

## 2023-08-31 LAB — MAGNESIUM: Magnesium: 1.7 mg/dL (ref 1.7–2.4)

## 2023-08-31 MED ORDER — CHLORHEXIDINE GLUCONATE CLOTH 2 % EX PADS
6.0000 | MEDICATED_PAD | Freq: Every day | CUTANEOUS | Status: AC
  Administered 2023-08-31 – 2023-09-04 (×4): 6 via TOPICAL

## 2023-08-31 MED ORDER — MAGNESIUM SULFATE 2 GM/50ML IV SOLN
2.0000 g | Freq: Once | INTRAVENOUS | Status: AC
  Administered 2023-08-31: 2 g via INTRAVENOUS
  Filled 2023-08-31: qty 50

## 2023-08-31 MED ORDER — FUROSEMIDE 10 MG/ML IJ SOLN
20.0000 mg/h | INTRAVENOUS | Status: DC
  Administered 2023-08-31 – 2023-09-01 (×2): 20 mg/h via INTRAVENOUS
  Filled 2023-08-31 (×2): qty 20

## 2023-08-31 MED ORDER — LABETALOL HCL 5 MG/ML IV SOLN: 10.0000 mg | INTRAVENOUS | Status: DC | PRN

## 2023-08-31 MED ORDER — GUAIFENESIN 100 MG/5ML PO LIQD
5.0000 mL | ORAL | Status: DC | PRN
  Administered 2023-09-02: 5 mL via ORAL
  Filled 2023-08-31: qty 15

## 2023-08-31 MED ORDER — MUPIROCIN 2 % EX OINT
1.0000 | TOPICAL_OINTMENT | Freq: Two times a day (BID) | CUTANEOUS | Status: AC
  Administered 2023-08-31 – 2023-09-04 (×10): 1 via NASAL
  Filled 2023-08-31 (×3): qty 22

## 2023-08-31 NOTE — Progress Notes (Signed)
eLink Physician-Brief Progress Note Patient Name: KENNYA OJO DOB: 12/17/1945 MRN: [id number]   Date of Service  08/31/2023  HPI/Events of Note  Notified of Mg 1.7, Cr 7.19  eICU Interventions  Magnesium 1 g IVPB ordered     Intervention Category Intermediate Interventions: Electrolyte abnormality - evaluation and management  Darl Pikes 08/31/2023, 3:09 AM

## 2023-08-31 NOTE — Progress Notes (Signed)
Daily Progress Note   Patient Name: [name]       Date: 08/31/2023 DOB: 1946/10/25  Age: 77 y.o. MRN#: [id number] Attending Physician: [name], MD Primary Care Physician: Myrlene Broker, MD Admit Date: 08/29/2023  Reason for Consultation/Follow-up: Establishing goals of care  Length of Stay: 1  Current Medications: Scheduled Meds:   amLODipine  10 mg Oral Daily   apixaban  2.5 mg Oral BID   aspirin EC  81 mg Oral Daily   calcitRIOL  0.25 mcg Oral Daily   carvedilol  25 mg Oral BID WC   Chlorhexidine Gluconate Cloth  6 each Topical Daily   Chlorhexidine Gluconate Cloth  6 each Topical Q0600   cilostazol  100 mg Oral BID   cloNIDine  0.2 mg Oral BID   DULoxetine  30 mg Oral Daily   hydrALAZINE  100 mg Oral TID   levothyroxine  50 mcg Oral Q0600   mirtazapine  15 mg Oral QHS   mupirocin ointment  1 Application Nasal BID   pantoprazole  40 mg Oral Daily   rosuvastatin  20 mg Oral Daily   sodium bicarbonate  650 mg Oral TID   sodium chloride flush  3 mL Intravenous Q12H    Continuous Infusions:  sodium chloride     furosemide (LASIX) 200 mg in dextrose 5 % 100 mL (2 mg/mL) infusion 20 mg/hr (08/31/23 1000)    PRN Meds: sodium chloride, acetaminophen **OR** acetaminophen, guaiFENesin, HYDROmorphone (DILAUDID) injection, ipratropium-albuterol, LORazepam, ondansetron **OR** ondansetron (ZOFRAN) IV, prochlorperazine, sodium chloride flush  Physical Exam Constitutional:      Comments: Tracheostomy collar  Cardiovascular:     Rate and Rhythm: Normal rate.  Pulmonary:     Effort: Pulmonary effort is normal.  Musculoskeletal:     Right lower leg: Tenderness present.  Skin:    General: Skin is warm and dry.  Neurological:     Mental Status: She is alert  and oriented to person, place, and time.  Psychiatric:        Mood and Affect: Mood normal.        Behavior: Behavior normal.             Vital Signs: BP (!) 95/47   Pulse 68   Temp 97.8 F (36.6 C) (Oral)   Resp  14   Ht 5' (1.524 m)   Wt 50.4 kg   SpO2 96%   BMI 21.70 kg/m  SpO2: SpO2: 96 % O2 Device: O2 Device: Tracheostomy Collar O2 Flow Rate: O2 Flow Rate (L/min): (S) 8 L/min   Patient Active Problem List   Diagnosis Date Noted   Coronary artery disease involving native coronary artery of native heart with refractory angina pectoris (HCC) 08/30/2023   Hypertensive crisis 08/30/2023   Volume overload 08/30/2023   Chest pain 08/30/2023   Hypertensive emergency 08/30/2023   ESRD (end stage renal disease) (HCC) 08/30/2023   History of pharyngeal cancer 08/30/2023   CKD (chronic kidney disease), symptom management only, stage 5 (HCC) 08/30/2023   Spondylosis of thoracolumbar region without myelopathy or radiculopathy 04/06/2023   Acute respiratory distress 04/06/2023   Acute on chronic heart failure with preserved ejection fraction (HFpEF) (HCC) 04/06/2023   Vision loss 11/17/2022   Pain, eye, left 11/17/2022   NSTEMI (non-ST elevated myocardial infarction) (HCC) 10/29/2022   Chronic diastolic (congestive) heart failure (HCC) 09/02/2022   Alaryngeal voice 08/13/2022   History of CVA (cerebrovascular accident) 08/13/2022   Orthopnea 08/07/2022   Facial swelling 01/30/2022   History of DVT (deep vein thrombosis) 01/05/2022   Chronic anemia 01/05/2022   Demand ischemia    History of head and neck cancer-s/p laryngectomy-with tracheostomy 01/02/2022   Paroxysmal atrial fibrillation (HCC) 01/02/2022   Neck pain 07/11/2021   Aortic atherosclerosis (HCC) 04/09/2021   Moderate protein-calorie malnutrition (HCC) 04/09/2021   Low back pain 01/23/2021   HLD (hyperlipidemia) 01/13/2021   Hypothyroidism 02/25/2020   Senile osteoporosis 02/15/2020   Peripheral arterial disease  (HCC) 02/15/2020   Tinea corporis 02/15/2020   Gastroesophageal reflux disease without esophagitis 09/09/2019   Osteopenia 01/19/2019   CKD (chronic kidney disease) stage 5, GFR less than 15 ml/min (HCC) 01/19/2019   Diarrhea 11/04/2018   Thiamine deficiency 01/02/2018   Prediabetes 12/28/2017   Dietary folate deficiency anemia 12/28/2017   Moderate episode of recurrent major depressive disorder (HCC) 10/22/2017   S/P laryngectomy 11/03/2016   Stenosis of tracheal stoma 06/10/2016   Diverticulum of pharynx 08/21/2015   Esophageal obstruction 07/17/2015   Esophageal stricture 07/17/2015   Major depression, chronic 02/28/2015   Osteoporosis 02/01/2015   Dysphagia, pharyngoesophageal phase 05/22/2014   History of radiation therapy    Insomnia 02/07/2013   History of uterine cancer 11/24/2012   Chronic pain syndrome 09/03/2012   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 female  with past medical history of neck cancer status post laryngectomy now has ostomy, CKD stage V, history of CVA, history of DVT, essential hypertension, hypothyroidism, paroxysmal atrial fibrillation,  peripheral arterial disease, coronary artery disease generalized anxiety disorder, MDD, and chronic anemia admitted on 08/29/2023 with chest pain, shortness of breath, and nausea.   Assessment: Patient had two rapids overnight for HTN and SOB.  Patient laying in bed. She states she feels good. She has no chest pain, shortness of breath, or nausea. She does have a little pain in her right leg. I notified RN. Her goal remains to get home with her family and she asks me if she can go home today. I told her I did not think she would discharge today. She has no concerns at this time. She would like for me to call her sons to give them  an update.   10:15 Attempted to call son Domingo Cocking.  Mailbox full. Called son Simonne Come. Updated him on visit with his mother. He is glad to have had the goals of care conversation yesterday. Their goal remains to get the patient home and to treat the treatable. Encouraged family to call with any needs.   Recommendations/Plan: DNR/DNI Continue to treat the treatable Continued discussion between family and patient re: goals of care Outpatient palliative care consult Continued PMT support: family will reach out with needs    Code Status:    Code Status Orders  (From admission, onward)           Start     Ordered   08/30/23 0306  Do not attempt resuscitation (DNR)- Limited -Do Not Intubate (DNI)  Continuous       Question Answer Comment  If pulseless and not breathing No CPR or chest compressions.   In Pre-Arrest Conditions (Patient Is Breathing and Has A Pulse) Do not intubate. Provide all appropriate non-invasive medical interventions. Avoid ICU transfer unless indicated or required.   Consent: Discussion documented in EHR or advanced directives reviewed      08/30/23 0307            Extensive chart review has been completed prior to meeting with the patient and calling her son including labs, vital signs, imaging, progress/consult notes, orders, medications, and available advance directive documents.  Care plan was discussed with bedside RN  Time spent: 35 minutes  Thank you for allowing the Palliative Medicine Team to assist in the care of this patient.   Sherryll Burger, NP  Please contact Palliative Medicine Team phone at [phone] for questions and concerns.

## 2023-08-31 NOTE — Progress Notes (Signed)
[facility]  PROGRESS NOTE  [name] GNF:[id number] DOB: Feb 21, 1946 DOA: 08/29/2023 PCP: Myrlene Broker, MD   Brief HPI:    77 y.o. female with medical history significant of neck cancer status post laryngectomy now has ostomy, CKD stage V, history of CVA, history of DVT, essential hypertension, hypothyroidism, paroxysmal atrial fibrillation,  peripheral arterial disease, coronary artery disease generalized anxiety disorder, MDD, and chronic anemia presented to emergency department for ED complaining of chest pain and shortness of breath with associated nausea.  Patient reported she is having central chest pain which started tonight and not relieved with sublingual nitroglycerin at home.  she received outpatient blood transfusion and Epogen shot 2 days ago after that patient has been developed acutely worsening of shortness of breath and chest pain.    patient was admitted in April 2024 with similar finding she has been evaluated by inpatient nephrology and patient was declined dialysis during that time.  Nephrology and cardiology consulted.      Assessment/Plan:   Hypertensive crisis -Resolved -BP was not controlled despite adding amlodipine to home meds including hydralazine, coreg, IV nitroglycerin, catapres patch (stopped) -Her oxygen requirement went up to FiO2 50% -she was transferred to ICU and started on scheduled clonidine, Clevidipine gtt -Clevidipine has been weaned off -Continue Coreg, hydralazine, amlodipine, scheduled Catapres 0.2 mg p.o. twice daily -Continue labetalol 10 mg IV every 4 hours as needed for BP greater than 160/100 -BP has improved   Type 2 NSTEMI blood transfusion on Friday for anemia, and starting Saturday developed severe central chest pressure rated at an 8/10 along with shortness of breath, likely from resulting volume overload  CT chest noncontrast showed three-vessel coronary artery calcifications, but she is unable to have a  heart catheterization performed (extensive discussion previously) given CKD 5 and her refusal of dialysis  Aggressive diuresis per cardiology Continue nitroglycerin drip Continue home Apixaban Continue aspirin   ESRD Declined dialysis in the past -nephrology consulted - Cr is 6.91   Paroxysmal atrial fibrillation -Patient's CHA2DS2-VASc is 2 score 8. -Continue Coreg 25 mg twice daily and Eliquis 2.5 mg twice daily.   History of CAD -Continue aspirin and Crestor.   Peripheral arterial disease -Continue cilostazol 100 mg twice daily.   History of CVA -Continue aspirin and Crestor   Anemia of chronic disease due to ESRD - Hemoglobin 8.3 and hematocrit 26.  Patient received 1 unit blood transfusion 4 days ago.  Also she is getting Epogen shot monthly at nephrology clinic. - Continue to monitor CBC.   Chronic metabolic acidosis due to ESRD BMP showed low bicarb 20. -Continue bicarb 650 mg 3 times daily. -Discussed with nephrology, no intervention recommended. -Patient has refused ESRD in the past. -Palliative care consult recommended.   Generalized anxiety disorder -Continue Cymbalta 30 mg daily and and decreasing dose of Ativan 0.5 mg twice daily as needed in the setting of ESRD.   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  At home she is on regular diet.   Goals of care discussion -Palliative care consulted for goals of care discussion.      Medications     amLODipine  10 mg Oral Daily   apixaban  2.5 mg Oral BID   aspirin EC  81 mg  Oral Daily   calcitRIOL  0.25 mcg Oral Daily   carvedilol  25 mg Oral BID WC   Chlorhexidine Gluconate Cloth  6 each Topical Daily   Chlorhexidine Gluconate Cloth  6 each Topical Q0600   cilostazol  100 mg Oral  BID   cloNIDine  0.2 mg Oral BID   DULoxetine  30 mg Oral Daily   hydrALAZINE  100 mg Oral TID   levothyroxine  50 mcg Oral Q0600   mirtazapine  15 mg Oral QHS   mupirocin ointment  1 Application Nasal BID   pantoprazole  40 mg Oral Daily   rosuvastatin  20 mg Oral Daily   sodium bicarbonate  650 mg Oral TID   sodium chloride flush  3 mL Intravenous Q12H     Data Reviewed:   CBG:  Recent Labs  Lab 08/30/23 2140 08/31/23 0316  GLUCAP 142* 140*    SpO2: 96 % O2 Flow Rate (L/min): (S) 8 L/min FiO2 (%): (S) 30 %    Vitals:   08/31/23 0615 08/31/23 0630 08/31/23 0721 08/31/23 0729  BP: (!) 195/78 (!) 173/67  (!) 140/64  Pulse: 74 70  73  Resp: 18 13  17   Temp:   98.1 F (36.7 C)   TempSrc:   Oral   SpO2: 98% 97%  96%  Weight:      Height:          Data Reviewed:  Basic Metabolic Panel: Recent Labs  Lab 08/29/23 2224 08/30/23 0603 08/31/23 0029  NA 140 139 135  K 4.6 4.4 4.4  CL 100 102 97*  CO2 20* 20* 22  GLUCOSE 122* 103* 143*  BUN 97* 96* 92*  CREATININE 6.91* 7.14* 7.19*  CALCIUM 9.1 8.8* 9.0  MG  --   --  1.7    CBC: Recent Labs  Lab 08/26/23 1020 08/29/23 2224 08/30/23 0603 08/31/23 0029  WBC  --  7.7 7.5 9.3  HGB 6.8* 8.3* 7.9* 9.4*  HCT  --  26.5* 25.8* 28.6*  MCV  --  90.8 93.5 91.1  PLT  --  220 215 232    LFT Recent Labs  Lab 08/30/23 0603 08/31/23 0029  AST 16 18  ALT 9 10  ALKPHOS 63 75  BILITOT 0.8 1.2  PROT 5.8* 6.5  ALBUMIN 2.8* 3.1*     Antibiotics: Anti-infectives (From admission, onward)    None        DVT prophylaxis: Eliquis  Code Status: DNR  Family Communication:    CONSULTS Nephrology, palliative care, cardiology, PCCM   Subjective   She was transferred to ICU last night for hypertensive crisis.  Required clevidipine drip which has been weaned off.  Denies chest pain or shortness of breath this morning.   Objective    Physical Examination:   General: Appears in no acute  distress Cardiovascular: S1-S2, regular Respiratory: Lungs clear to auscultation bilaterally Abdomen: Soft, nontender, no organomegaly Extremities: No edema in the lower extremities Neurologic: Alert, oriented x 3, no focal deficit noted   Status is: Inpatient:       [name]   [facility] If 7PM-7AM, please contact night-coverage at [email or web address], Office  [phone]   08/31/2023, 8:34 AM  LOS: 1 day

## 2023-08-31 NOTE — Plan of Care (Signed)
  Problem: Education: Goal: Understanding of cardiac disease, CV risk reduction, and recovery process will improve Outcome: Progressing   Problem: Cardiac: Goal: Ability to achieve and maintain adequate cardiovascular perfusion will improve Outcome: Progressing   Problem: Activity: Goal: Risk for activity intolerance will decrease Outcome: Progressing   Problem: Pain Managment: Goal: General experience of comfort will improve Outcome: Progressing

## 2023-08-31 NOTE — Progress Notes (Signed)
eLink Physician-Brief Progress Note Patient Name: [name] DOB: 1946-12-10 MRN: [id number]   Date of Service  08/31/2023  HPI/Events of Note  RN reports pt retaining urine.  Bladder scan = 745 ml  eICU Interventions  Ordered in and out cath     Intervention Category Intermediate Interventions: Other:  Darl Pikes 08/31/2023, 2:34 AM

## 2023-08-31 NOTE — Progress Notes (Signed)
Rounding Note    Patient Name: [name] Date of Encounter: 08/31/2023  [facility] Cardiologist: Kristeen Miss, MD   Subjective   Reviewed overnight events. Transferred to ICU, started on cleviprex drip for persistently elevated BP. Resting comfortably this AM  Inpatient Medications    Scheduled Meds:  amLODipine  10 mg Oral Daily   apixaban  2.5 mg Oral BID   aspirin EC  81 mg Oral Daily   calcitRIOL  0.25 mcg Oral Daily   carvedilol  25 mg Oral BID WC   Chlorhexidine Gluconate Cloth  6 each Topical Daily   Chlorhexidine Gluconate Cloth  6 each Topical Q0600   cilostazol  100 mg Oral BID   cloNIDine  0.2 mg Oral BID   DULoxetine  30 mg Oral Daily   hydrALAZINE  100 mg Oral TID   levothyroxine  50 mcg Oral Q0600   mirtazapine  15 mg Oral QHS   mupirocin ointment  1 Application Nasal BID   pantoprazole  40 mg Oral Daily   rosuvastatin  20 mg Oral Daily   sodium bicarbonate  650 mg Oral TID   sodium chloride flush  3 mL Intravenous Q12H   Continuous Infusions:  sodium chloride     clevidipine Stopped (08/31/23 0529)   furosemide (LASIX) 200 mg in dextrose 5 % 100 mL (2 mg/mL) infusion 20 mg/hr (08/31/23 0927)   PRN Meds: sodium chloride, acetaminophen **OR** acetaminophen, guaiFENesin, HYDROmorphone (DILAUDID) injection, ipratropium-albuterol, LORazepam, ondansetron **OR** ondansetron (ZOFRAN) IV, prochlorperazine, sodium chloride flush   Vital Signs    Vitals:   08/31/23 0615 08/31/23 0630 08/31/23 0721 08/31/23 0729  BP: (!) 195/78 (!) 173/67  (!) 140/64  Pulse: 74 70  73  Resp: 18 13  17   Temp:   98.1 F (36.7 C)   TempSrc:   Oral   SpO2: 98% 97%  96%  Weight:      Height:        Intake/Output Summary (Last 24 hours) at 08/31/2023 0936 Last data filed at 08/31/2023 0609 Gross per 24 hour  Intake 854.47 ml  Output 1450 ml  Net -595.53 ml      08/31/2023    4:09 AM 08/30/2023    9:49 PM 08/29/2023   10:19 PM  Last 3 Weights  Weight  (lbs) 111 lb 1.8 oz 111 lb 1.8 oz 115 lb  Weight (kg) 50.4 kg 50.4 kg 52.164 kg      Telemetry    SR - Personally Reviewed  Physical Exam   GEN: No acute distress.   Neck: No JVD. Trach stoma. Cardiac: RRR, no murmurs, rubs, or gallops.  Respiratory: diffusely diminished GI: Soft, nontender, non-distended  MS: trivial bilateral LE edema; No deformity. Neuro:  Nonfocal  Psych: Normal affect   Labs    High Sensitivity Troponin:   Recent Labs  Lab 08/29/23 2224 08/30/23 0027 08/30/23 0603 08/30/23 0753  TROPONINIHS 574* 475* 448* 413*     Chemistry Recent Labs  Lab 08/29/23 2224 08/30/23 0603 08/31/23 0029  NA 140 139 135  K 4.6 4.4 4.4  CL 100 102 97*  CO2 20* 20* 22  GLUCOSE 122* 103* 143*  BUN 97* 96* 92*  CREATININE 6.91* 7.14* 7.19*  CALCIUM 9.1 8.8* 9.0  MG  --   --  1.7  PROT  --  5.8* 6.5  ALBUMIN  --  2.8* 3.1*  AST  --  16 18  ALT  --  9 10  ALKPHOS  --  63 75  BILITOT  --  0.8 1.2  GFRNONAA 6* 5* 5*  ANIONGAP 20* 17* 16*    Lipids No results for input(s): "CHOL", "TRIG", "HDL", "LABVLDL", "LDLCALC", "CHOLHDL" in the last 168 hours.  Hematology Recent Labs  Lab 08/29/23 2224 08/30/23 0603 08/31/23 0029  WBC 7.7 7.5 9.3  RBC 2.92* 2.76* 3.14*  HGB 8.3* 7.9* 9.4*  HCT 26.5* 25.8* 28.6*  MCV 90.8 93.5 91.1  MCH 28.4 28.6 29.9  MCHC 31.3 30.6 32.9  RDW 14.6 14.6 14.4  PLT 220 215 232   Thyroid No results for input(s): "TSH", "FREET4" in the last 168 hours.  BNP Recent Labs  Lab 08/30/23 0027  BNP 879.1*    DDimer No results for input(s): "DDIMER" in the last 168 hours.   Radiology    DG Chest 2 View  Result Date: 08/29/2023 CLINICAL DATA:  Chest pain EXAM: CHEST - 2 VIEW COMPARISON:  04/12/2023 FINDINGS: Cardiac shadow is enlarged but stable. Aortic calcifications are noted. Lungs are well aerated bilaterally. Diffuse interstitial changes and mild vascular congestion are seen consistent with congestive failure and mild edema. No  sizable effusion is noted. No focal confluent infiltrate is seen. IMPRESSION: Worsening CHF with edema. Electronically Signed   By: Alcide Clever M.D.   On: 08/29/2023 23:14    Cardiac Studies   No new this admission  Patient Profile     77 y.o. female ith a history of head/neck cancer s/p laryngectomy with stoma,, atrial fibrillation, HTN, anxiety, depression, HLD, PAD, COPD, chronic pain, dysphagia, CKD stage V, GERD, hypothyroidism, who is being seen for HTN emergency, CHF, and elevated troponins at the request of Dr Janalyn Shy   Assessment & Plan    Chest pain Demand ischemia Hypertensive emergency -known 3V coronary calcification, suggests CAD. However, with her CKD stage 5 and wanting not to go on dialysis, she is not a candidate for invasive angiography -hsTn, BNP within historical ranges, hsTn flat to slightly downtrending, not consistent with ACS. -Cr 7.19 (rising) today. BUN 92. Appreciate nephrology involvement. She has made her wishes known previously that she does not want dialysis. Agree with involving palliative care or even hospice depending on her trajectory.  Blood pressure: has been labile. Even this AM, had a 190 systolic reading and then had a 112 systolic reading while I was in the room. She is now off the cleviprex and nitroglycerin drips  Current BP meds: -carvedilol 25 mg BID (max) -clonidine increased to 0.2 mg BID -furosemide: now on drip, urine output total 1.45 L in 2 days -hydralazine: 100 mg TID (max dose) -imdur 60 mg daily as outpatient, can restart off drip -on minoxidil 2.5 mg BID as outpatient. This can cause fluid retention and angina. Will hold this  Chronic anemia Paroxysmal atrial fibrillation PAD -with recent transfusions -on reduced dose apixaban for pAF, with history of prior CVA. -CHA2DS2/VAS Stroke Risk Points=9  -if she has evidence of blood loss and not just chronic disease anemia, would stop aspirin and continue apixaban -with evidence of  volume overload, would stop cilostazol if volume status does not improve with lasix drip. -continue rosuvastatin  Overall this is a difficult situation. Nephrology and critical care are managing most of her issues at this time. Would continue attempting to titrate oral meds, would hold cilostazol if volume status does not improve. Agree with palliative care involvement. Cardiology will sign off but please contact us with questions or concerns.  For questions or updates,  please contact [facility] Please consult [email or web address] for contact info under        Signed, Jodelle Red, MD  08/31/2023, 9:36 AM

## 2023-08-31 NOTE — Progress Notes (Signed)
   08/30/23 1500  Assess: MEWS Score  Temp 98.3 F (36.8 C)  BP (!) 223/88  MAP (mmHg) 134  Pulse Rate 80  ECG Heart Rate 79  Resp 20  Level of Consciousness Alert  SpO2 95 %  O2 Device Room Air  Assess: MEWS Score  MEWS Temp 0  MEWS Systolic 2  MEWS Pulse 0  MEWS RR 0  MEWS LOC 0  MEWS Score 2  MEWS Score Color Yellow  Assess: if the MEWS score is Yellow or Red  Were vital signs accurate and taken at a resting state? No, vital signs rechecked  Does the patient meet 2 or more of the SIRS criteria? No  MEWS guidelines implemented  No, previously yellow, continue vital signs every 4 hours  Provider Notification  Provider Name/Title Dr Sharl Ma  Date Provider Notified 08/30/23  Time Provider Notified 1500  Method of Notification Page  Notification Reason Other (Comment) (Yellow MEWS)  Provider response No new orders  Date of Provider Response 08/30/23  Time of Provider Response 1503  Assess: SIRS CRITERIA  SIRS Temperature  0  SIRS Pulse 0  SIRS Respirations  0  SIRS WBC 0  SIRS Score Sum  0

## 2023-08-31 NOTE — Progress Notes (Signed)
[facility] Progress Note    Assessment/ Plan:   CKD5 -followed by me as an outpatient. Patient has had pre-existing wishes to not do dialysis which we have confirmed as an outpatient and as an inpatient. We have been trying to get her established with palliative care as an outpatient but family has been unable to arrange for this. -She has been exhibiting nausea/vomiting. Concern for uremia. Will certainly respect her wishes in regards to not pursuing dialysis and son is in agreement with this. Would recommend consideration of comfort care/hospice to maintain quality of life but she is not ready for hospice at this time. Appreciate palliative care's assistance. -we also discussed home therapies to see if that is an option and patient does not want to pursue this -Avoid nephrotoxic medications including NSAIDs and iodinated intravenous contrast exposure unless the latter is absolutely indicated.  Preferred narcotic agents for pain control are hydromorphone, fentanyl, and methadone. Morphine should not be used. Avoid Baclofen and avoid oral sodium phosphate and magnesium citrate based laxatives / bowel preps. Continue strict Input and Output monitoring. Will monitor the patient closely with you and intervene or adjust therapy as indicated by changes in clinical status/labs    Acute on chronic diastolic CHF exacerbation -currently on lasix gtt 20mg /hr -cardiology consulted -palliative care consult as above   Chest pain, type 2 NSTEMI -off cleviprex &  NTG gtt -cardiology following  HTN emergency -improved -transition to torsemide 40mg  bid once off lasix gtt   Anemia of CKD -transfuse prn for hgb <7 -last dose of retacrit 41324 units on 9/12 (receiving q2weeks) -seems to be fe replete based on panel from 9/12   Metabolic acidosis -c/w nahco3 supplementation, CO2 acceptable   H/o pharyngeal Ca s/p trach -per primary   H/o DVT -on eliquis   Discussed at length with  family at the bedside.  Subjective:   Patient seen and examined in the ICU. Moved to ICU yesterday for cleviprex drip. Now off. Transferred back to [facility]. No complaints. Son and daughter in law at bedside.   Objective:   BP 128/62   Pulse 75   Temp 98 F (36.7 C) (Oral)   Resp (!) 25   Ht 5' (1.524 m)   Wt 50.4 kg   SpO2 97%   BMI 21.70 kg/m   Intake/Output Summary (Last 24 hours) at 08/31/2023 1627 Last data filed at 08/31/2023 1600 Gross per 24 hour  Intake 1193.72 ml  Output 1250 ml  Net -56.28 ml   Weight change: -1.764 kg  Physical Exam: Gen: NAD, comfortable CVS: RRR Resp: normal wob, trach in place Abd: soft Ext: no sig edema b/l les Neuro: awake, alert  Imaging: DG Chest 2 View  Result Date: 08/29/2023 CLINICAL DATA:  Chest pain EXAM: CHEST - 2 VIEW COMPARISON:  04/12/2023 FINDINGS: Cardiac shadow is enlarged but stable. Aortic calcifications are noted. Lungs are well aerated bilaterally. Diffuse interstitial changes and mild vascular congestion are seen consistent with congestive failure and mild edema. No sizable effusion is noted. No focal confluent infiltrate is seen. IMPRESSION: Worsening CHF with edema. Electronically Signed   By: Alcide Clever M.D.   On: 08/29/2023 23:14    Labs: BMET Recent Labs  Lab 08/29/23 2224 08/30/23 0603 08/31/23 0029  NA 140 139 135  K 4.6 4.4 4.4  CL 100 102 97*  CO2 20* 20* 22  GLUCOSE 122* 103* 143*  BUN 97* 96* 92*  CREATININE 6.91* 7.14* 7.19*  CALCIUM 9.1 8.8* 9.0  CBC Recent Labs  Lab 08/26/23 1020 08/29/23 2224 08/30/23 0603 08/31/23 0029  WBC  --  7.7 7.5 9.3  HGB 6.8* 8.3* 7.9* 9.4*  HCT  --  26.5* 25.8* 28.6*  MCV  --  90.8 93.5 91.1  PLT  --  220 215 232    Medications:     amLODipine  10 mg Oral Daily   apixaban  2.5 mg Oral BID   aspirin EC  81 mg Oral Daily   calcitRIOL  0.25 mcg Oral Daily   carvedilol  25 mg Oral BID WC   Chlorhexidine Gluconate Cloth  6 each Topical Daily    Chlorhexidine Gluconate Cloth  6 each Topical Q0600   cilostazol  100 mg Oral BID   cloNIDine  0.2 mg Oral BID   DULoxetine  30 mg Oral Daily   hydrALAZINE  100 mg Oral TID   levothyroxine  50 mcg Oral Q0600   mirtazapine  15 mg Oral QHS   mupirocin ointment  1 Application Nasal BID   pantoprazole  40 mg Oral Daily   rosuvastatin  20 mg Oral Daily   sodium bicarbonate  650 mg Oral TID   sodium chloride flush  3 mL Intravenous Q12H      Anthony Sar, MD [facility] 08/31/2023, 4:27 PM

## 2023-08-31 NOTE — Progress Notes (Signed)
eLink Physician-Brief Progress Note Patient Name: [name] DOB: [birth year] MRN: [id number]   Date of Service  08/31/2023  HPI/Events of Note  RN asking if CBG should be checked on this pt.  No h/o DM, has diet ordered...states pt has poor appetite  eICU Interventions  AM lab glucose has been acceptable No need to get CBGs for now. Reassess once this AM labs resulted     Intervention Category Intermediate Interventions: Other:  Darl Pikes 08/31/2023, 3:25 AM

## 2023-09-01 DIAGNOSIS — E8770 Fluid overload, unspecified: Secondary | ICD-10-CM | POA: Diagnosis not present

## 2023-09-01 MED ORDER — AMLODIPINE BESYLATE 10 MG PO TABS
10.0000 mg | ORAL_TABLET | Freq: Once | ORAL | Status: AC
  Administered 2023-09-01: 10 mg via ORAL

## 2023-09-01 MED ORDER — HYDRALAZINE HCL 50 MG PO TABS
100.0000 mg | ORAL_TABLET | Freq: Once | ORAL | Status: AC
  Administered 2023-09-01: 100 mg via ORAL

## 2023-09-01 MED ORDER — CLONIDINE HCL 0.2 MG PO TABS
0.2000 mg | ORAL_TABLET | Freq: Once | ORAL | Status: AC
  Administered 2023-09-01: 0.2 mg via ORAL

## 2023-09-01 MED ORDER — TORSEMIDE 20 MG PO TABS
40.0000 mg | ORAL_TABLET | Freq: Two times a day (BID) | ORAL | Status: DC
  Administered 2023-09-01: 40 mg via ORAL
  Filled 2023-09-01: qty 2

## 2023-09-01 NOTE — Evaluation (Signed)
Physical Therapy Evaluation Patient Details Name: [name] MRN: [id number] DOB: [birth year] Today's Date: 09/01/2023  History of Present Illness  Pt is a 77 y/o female admitted 9/15 with CP and SOB.  PMH includes neck CA, ostomy, CKD stage V, CVA, DVT, HTNpAfib, PAD  Clinical Impression  Pt admitted with/for CP and SOB.  Pt is improving, but not at her baseline functioning, needing CGA.  Pt currently limited functionally due to the problems listed below.  (see problems list.)  Pt will benefit from PT to maximize function and safety to be able to get home safely with available assist.         If plan is discharge home, recommend the following:     Can travel by private vehicle        Equipment Recommendations None recommended by PT  Recommendations for Other Services       Functional Status Assessment Patient has had a recent decline in their functional status and demonstrates the ability to make significant improvements in function in a reasonable and predictable amount of time.     Precautions / Restrictions Precautions Precautions: Fall      Mobility  Bed Mobility Overal bed mobility: Modified Independent                  Transfers Overall transfer level: Needs assistance Equipment used: 1 person hand held assist Transfers: Sit to/from Stand, Bed to chair/wheelchair/BSC Sit to Stand: Supervision   Step pivot transfers: Contact guard assist       General transfer comment: limited to EOB per pt's wishes, steady without assist, though provide assist.    Ambulation/Gait Ambulation/Gait assistance: Contact guard assist Gait Distance (Feet): 4 Feet (side stepping at EOB with HHA)   Gait Pattern/deviations: Step-to pattern       General Gait Details: generally steady, though assisted face to face due to pt declined walking away from the bed.  Stairs            Wheelchair Mobility     Tilt Bed    Modified Rankin (Stroke Patients Only)        Balance Overall balance assessment: No apparent balance deficits (not formally assessed)                                           Pertinent Vitals/Pain      Home Living Family/patient expects to be discharged to:: Private residence Living Arrangements: Children Available Help at Discharge: Family;Available PRN/intermittently Type of Home: House Home Access: Level entry       Home Layout: One level        Prior Function Prior Level of Function : Independent/Modified Independent             Mobility Comments: independent with walking, works at [employer], not on home O2 ADLs Comments: independent bathing and dressing; does not drive     Extremity/Trunk Assessment   Upper Extremity Assessment Upper Extremity Assessment: Overall WFL for tasks assessed    Lower Extremity Assessment Lower Extremity Assessment: Overall WFL for tasks assessed    Cervical / Trunk Assessment Cervical / Trunk Assessment: Normal  Communication   Communication Communication:  (Laryngectomy)  Cognition Arousal: Alert Behavior During Therapy: WFL for tasks assessed/performed Overall Cognitive Status: Within Functional Limits for tasks assessed  General Comments General comments (skin integrity, edema, etc.): VSS,  SoO2 upper 90's on 28% Fio2 via stoma collar.    Exercises     Assessment/Plan    PT Assessment Patient needs continued PT services  PT Problem List Decreased activity tolerance;Decreased mobility;Cardiopulmonary status limiting activity;Decreased balance;Decreased strength       PT Treatment Interventions Gait training;Stair training;Functional mobility training;Therapeutic activities;Therapeutic exercise;Patient/family education    PT Goals (Current goals can be found in the Care Plan section)  Acute Rehab PT Goals Patient Stated Goal: back independent PT Goal Formulation: With  patient Time For Goal Achievement: 09/15/23 Potential to Achieve Goals: Good    Frequency Min 1X/week     Co-evaluation               AM-PAC PT "6 Clicks" Mobility  Outcome Measure Help needed turning from your back to your side while in a flat bed without using bedrails?: None Help needed moving from lying on your back to sitting on the side of a flat bed without using bedrails?: None Help needed moving to and from a bed to a chair (including a wheelchair)?: None Help needed standing up from a chair using your arms (e.g., wheelchair or bedside chair)?: A Little Help needed to walk in hospital room?: A Little Help needed climbing 3-5 steps with a railing? : A Little 6 Click Score: 21    End of Session   Activity Tolerance: Patient tolerated treatment well Patient left: in bed;with call bell/phone within reach Nurse Communication: Mobility status PT Visit Diagnosis: Other abnormalities of gait and mobility (R26.89);Difficulty in walking, not elsewhere classified (R26.2)    Time: 1610-9604 PT Time Calculation (min) (ACUTE ONLY): 22 min   Charges:   PT Evaluation $PT Eval Moderate Complexity: 1 Mod   PT General Charges $$ ACUTE PT VISIT: 1 Visit         09/01/2023  Jacinto Halim., PT Acute Rehabilitation Services [phone]  (office)  Eliseo Gum Konrad Hoak 09/01/2023, 5:25 PM

## 2023-09-01 NOTE — Plan of Care (Signed)
Problem: Cardiac: Goal: Ability to achieve and maintain adequate cardiovascular perfusion will improve Outcome: Progressing   Problem: Health Behavior/Discharge Planning: Goal: Ability to safely manage health-related needs after discharge will improve Outcome: Progressing   Problem: Education: Goal: Knowledge of General Education information will improve Description: Including pain rating scale, medication(s)/side effects and non-pharmacologic comfort measures Outcome: Progressing   Problem: Health Behavior/Discharge Planning: Goal: Ability to manage health-related needs will improve Outcome: Progressing

## 2023-09-01 NOTE — Progress Notes (Signed)
Pt transported to 5M17 VSS, no issues. Pt son at bedside, all belongings with pt. Receiving RN at bedside.

## 2023-09-01 NOTE — Progress Notes (Signed)
[facility] Progress Note    Assessment/ Plan:   CKD5 -followed by me as an outpatient. Patient has had pre-existing wishes to not do dialysis which we have confirmed as an outpatient and as an inpatient -She has been exhibiting nausea/vomiting. Concern for uremia. Will certainly respect her wishes in regards to not pursuing dialysis and son is in agreement with this. Would recommend consideration of comfort care/hospice to maintain quality of life but she is not ready for hospice at this time. Appreciate palliative care's assistance. -we also discussed home therapies to see if that is an option and patient does not want to pursue this -Avoid nephrotoxic medications including NSAIDs and iodinated intravenous contrast exposure unless the latter is absolutely indicated.  Preferred narcotic agents for pain control are hydromorphone, fentanyl, and methadone. Morphine should not be used. Avoid Baclofen and avoid oral sodium phosphate and magnesium citrate based laxatives / bowel preps. Continue strict Input and Output monitoring. Will monitor the patient closely with you and intervene or adjust therapy as indicated by changes in clinical status/labs    Acute on chronic diastolic CHF exacerbation -currently on lasix gtt 20mg /hr, will stop drip and transition to torsemide 40mg  BID -cardiology consulted -palliative care consult as above   Chest pain, type 2 NSTEMI -off cleviprex &  NTG gtt -seen by cardio -unable to have card cath given CKD5  HTN emergency -improved -changing lasix gtt to torsemide 40mg  bid as above -on clonidine 0.2mg  BID, coreg 25mg  BID, hydralazine 100mg  TID, amlodipine 10mg  daily   Anemia of CKD -transfuse prn for hgb <7 -last dose of retacrit 16109 units on 9/12 (receiving q2weeks) -seems to be fe replete based on panel from 9/12   Metabolic acidosis -c/w nahco3 supplementation, CO2 acceptable   H/o pharyngeal Ca s/p trach -per primary   H/o  DVT -on eliquis   Discussed with daughter in law over the phone.  Subjective:   Patient seen and examined in the ICU. No acute events. She gave me a thumbs up when I asked her how she's doing. Uop ~1.2L   Objective:   BP (!) 96/47   Pulse 72   Temp (!) 97.1 F (36.2 C) (Axillary)   Resp 18   Ht 5' (1.524 m)   Wt 51.2 kg   SpO2 96%   BMI 22.04 kg/m   Intake/Output Summary (Last 24 hours) at 09/01/2023 0939 Last data filed at 09/01/2023 0800 Gross per 24 hour  Intake 546.87 ml  Output 1070 ml  Net -523.13 ml   Weight change: 0.8 kg  Physical Exam: Gen: NAD, comfortable CVS: RRR Resp: cta bl, normal wob, TC in place Abd: soft Ext: no sig edema b/l les Neuro: awake, alert  Imaging: No results found.  Labs: BMET Recent Labs  Lab 08/29/23 2224 08/30/23 0603 08/31/23 0029 08/31/23 1627 09/01/23 0333  NA 140 139 135 135 133*  K 4.6 4.4 4.4 4.1 4.6  CL 100 102 97* 95* 95*  CO2 20* 20* 22 24 26   GLUCOSE 122* 103* 143* 114* 99  BUN 97* 96* 92* 93* 97*  CREATININE 6.91* 7.14* 7.19* 7.60* 7.86*  CALCIUM 9.1 8.8* 9.0 8.8* 8.5*   CBC Recent Labs  Lab 08/29/23 2224 08/30/23 0603 08/31/23 0029 09/01/23 0333  WBC 7.7 7.5 9.3 7.4  HGB 8.3* 7.9* 9.4* 8.4*  HCT 26.5* 25.8* 28.6* 26.5*  MCV 90.8 93.5 91.1 90.4  PLT 220 215 232 244    Medications:     amLODipine  10  mg Oral Daily   apixaban  2.5 mg Oral BID   aspirin EC  81 mg Oral Daily   calcitRIOL  0.25 mcg Oral Daily   carvedilol  25 mg Oral BID WC   Chlorhexidine Gluconate Cloth  6 each Topical Daily   Chlorhexidine Gluconate Cloth  6 each Topical Q0600   cilostazol  100 mg Oral BID   cloNIDine  0.2 mg Oral BID   DULoxetine  30 mg Oral Daily   hydrALAZINE  100 mg Oral TID   levothyroxine  50 mcg Oral Q0600   mirtazapine  15 mg Oral QHS   mupirocin ointment  1 Application Nasal BID   pantoprazole  40 mg Oral Daily   rosuvastatin  20 mg Oral Daily   sodium bicarbonate  650 mg Oral TID   sodium  chloride flush  3 mL Intravenous Q12H      Anthony Sar, MD [facility] 09/01/2023, 9:39 AM

## 2023-09-01 NOTE — Progress Notes (Signed)
PROGRESS NOTE  [name]  AOZ:[id number] DOB: 1946-02-08 DOA: 08/29/2023 PCP: Myrlene Broker, MD   Brief Narrative: Patient is a 77 year old female with history of neck cancer status post laryngectomy now has ostomy, CKD stage V, history of CVA, DVT, hypertension, hypothyroidism, paroxysmal A-fib, peripheral artery disease, coronary disease, anxiety disorder, chronic anemia who presented with chest pain, shortness of breath.  Severely hypertensive on presentation and had to be sent to ICU for clevidipine drip.  Palliative care consulted for goals of care.  Cardiology was also following.    Assessment & Plan:  Principal Problem:   Volume overload Active Problems:   Paroxysmal atrial fibrillation (HCC)   Orthopnea   Coronary artery disease involving native coronary artery of native heart with refractory angina pectoris (HCC)   Chest pain   Hypertensive emergency   ESRD (end stage renal disease) (HCC)   GAD (generalized anxiety disorder)   Peripheral arterial disease (HCC)   Hypothyroidism   History of DVT (deep vein thrombosis)   Chronic anemia   History of CVA (cerebrovascular accident)   NSTEMI (non-ST elevated myocardial infarction) (HCC)   Acute on chronic heart failure with preserved ejection fraction (HFpEF) (HCC)   Hypertensive crisis   History of pharyngeal cancer   CKD (chronic kidney disease), symptom management only, stage 5 (HCC)   Chest pain: Presented with chest pain.  History of coronary disease.  CT chest showed three-vessel coronary artery calcification.  Currently chest pain has resolved.  Cardiology following.  No plan for invasive intervention.  Troponins elevated but flat trend, not consistent with ACS.  Does not complain of any chest pain today  Hypertensive urgency: Severely hypertensive on presentation.  Started on clevidipine drip, nitroglycerin.  Currently on Coreg, clonidine, amlodipine,torsemide, hydralazine.  Minoxidil stopped.  Blood  pressure soft today so some medications are on hold  CKD stage V: Patient declining dialysis.  Nephrology following.  Continue bicarb tablets.  Does not look volume overloaded  Acute on chronic diastolic CHF exacerbation: Was On Lasix drip.  Takes torsemide 40 mg twice daily at home,now resumed  Paroxysmal A-fib/peripheral artery disease/CVA/DVT: On Eliquis for anticoagulation, is on aspirin.  On statin.  On Coreg for rate control.  Also on cilostazol for peripheral artery disease.  Currently she is in normal sinus rhythm  Normocytic anemia: Chronic, likely history with ESRD.  Hemoglobin in the range of 8.  Received blood transfusion before this admission as an outpatient.  On Epogen  Anxiety disorder: Cymbalta, Ativan  Hypothyroidism: On levothyroxine  History of pharyngeal cancer: Status post tracheostomy  Deconditioning: Will consult PT  Goals of care: Elderly patient with multiple comorbidities.  Palliative care consulted and following.  CODE STATUS is DNR.  The goal of the patient and family is to go home        DVT prophylaxis:SCDs Start: 08/30/23 0308 Place TED hose Start: 08/30/23 0308 apixaban (ELIQUIS) tablet 2.5 mg Start: 08/30/23 0130 apixaban (ELIQUIS) tablet 2.5 mg     Code Status: Limited: Do not attempt resuscitation (DNR) -DNR-LIMITED -Do Not Intubate/DNI   Family Communication: None at the bedside  Patient status: Inpatient  Patient is from : Home  Anticipated discharge to: Home  Estimated DC date: Tomorrow   Consultants: PCCM, cardiology, Palliative care  Procedures: None  Antimicrobials:  Anti-infectives (From admission, onward)    None       Subjective: Patient seen and examined at bedside today.  Hemodynamically stable.  Systolic blood pressure in the range of 90s.  Looks comfortable.  Denies any pain or discomfort.  Lying in bed.  Objective: Vitals:   09/01/23 0800 09/01/23 0812 09/01/23 0900 09/01/23 0905  BP:  (!) 166/64 (!) 94/37  (!) 96/47  Pulse: 71 73 66 72  Resp: 17 15 12 18   Temp:      TempSrc:      SpO2: 99% 97% 96% 96%  Weight:      Height:        Intake/Output Summary (Last 24 hours) at 09/01/2023 0937 Last data filed at 09/01/2023 0800 Gross per 24 hour  Intake 546.87 ml  Output 1070 ml  Net -523.13 ml   Filed Weights   08/30/23 2149 08/31/23 0409 09/01/23 0500  Weight: 50.4 kg 50.4 kg 51.2 kg    Examination:  General exam: Overall comfortable, not in distress, weak, deconditioned HEENT: PERRL, tracheal stoma Respiratory system:  no wheezes , few bilateral basal crackles Cardiovascular system: S1 & S2 heard, RRR.  Gastrointestinal system: Abdomen is nondistended, soft and nontender. Central nervous system: Alert and oriented Extremities: No edema, no clubbing ,no cyanosis Skin: No rashes, no ulcers,no icterus     Data Reviewed: I have personally reviewed following labs and imaging studies  CBC: Recent Labs  Lab 08/26/23 1020 08/29/23 2224 08/30/23 0603 08/31/23 0029 09/01/23 0333  WBC  --  7.7 7.5 9.3 7.4  HGB 6.8* 8.3* 7.9* 9.4* 8.4*  HCT  --  26.5* 25.8* 28.6* 26.5*  MCV  --  90.8 93.5 91.1 90.4  PLT  --  220 215 232 244   Basic Metabolic Panel: Recent Labs  Lab 08/29/23 2224 08/30/23 0603 08/31/23 0029 08/31/23 1627 09/01/23 0333  NA 140 139 135 135 133*  K 4.6 4.4 4.4 4.1 4.6  CL 100 102 97* 95* 95*  CO2 20* 20* 22 24 26   GLUCOSE 122* 103* 143* 114* 99  BUN 97* 96* 92* 93* 97*  CREATININE 6.91* 7.14* 7.19* 7.60* 7.86*  CALCIUM 9.1 8.8* 9.0 8.8* 8.5*  MG  --   --  1.7  --   --      Recent Results (from the past 240 hour(s))  MRSA Next Gen by PCR, Nasal     Status: Abnormal   Collection Time: 08/30/23  9:53 PM   Specimen: Nasal Mucosa; Nasal Swab  Result Value Ref Range Status   MRSA by PCR Next Gen DETECTED (A) NOT DETECTED Final    Comment: RESULT CALLED TO, READ BACK BY AND VERIFIED WITH: B WARNER,RN@2338  08/30/23 MK (NOTE) The GeneXpert MRSA Assay (FDA  approved for NASAL specimens only), is one component of a comprehensive MRSA colonization surveillance program. It is not intended to diagnose MRSA infection nor to guide or monitor treatment for MRSA infections. Test performance is not FDA approved in patients less than 47 years old. Performed at [facility], [address] [place], [place] [address]   Culture, BAL-quantitative w Gram Stain     Status: None (Preliminary result)   Collection Time: 08/31/23  4:34 AM   Specimen: Bronchoalveolar Lavage; Respiratory  Result Value Ref Range Status   Specimen Description BRONCHIAL ALVEOLAR LAVAGE  Final   Special Requests NONE  Final   Gram Stain   Final    FEW WBC PRESENT,BOTH PMN AND MONONUCLEAR FEW YEAST WITH PSEUDOHYPHAE FEW GRAM POSITIVE COCCI IN PAIRS FEW GRAM NEGATIVE RODS Performed at [facility], [address], [place], [place] [address]    Culture PENDING  Incomplete   Report  Status PENDING  Incomplete     Radiology Studies: No results found.  Scheduled Meds:  amLODipine  10 mg Oral Daily   apixaban  2.5 mg Oral BID   aspirin EC  81 mg Oral Daily   calcitRIOL  0.25 mcg Oral Daily   carvedilol  25 mg Oral BID WC   Chlorhexidine Gluconate Cloth  6 each Topical Daily   Chlorhexidine Gluconate Cloth  6 each Topical Q0600   cilostazol  100 mg Oral BID   cloNIDine  0.2 mg Oral BID   DULoxetine  30 mg Oral Daily   hydrALAZINE  100 mg Oral TID   levothyroxine  50 mcg Oral Q0600   mirtazapine  15 mg Oral QHS   mupirocin ointment  1 Application Nasal BID   pantoprazole  40 mg Oral Daily   rosuvastatin  20 mg Oral Daily   sodium bicarbonate  650 mg Oral TID   sodium chloride flush  3 mL Intravenous Q12H   Continuous Infusions:  sodium chloride     furosemide (LASIX) 200 mg in dextrose 5 % 100 mL (2 mg/mL) infusion 20 mg/hr (09/01/23 0800)     LOS: 2 days   Burnadette Pop, MD [facility] P9/18/2024, 9:37 AM

## 2023-09-02 ENCOUNTER — Encounter: Payer: Self-pay | Admitting: Internal Medicine

## 2023-09-02 DIAGNOSIS — E8770 Fluid overload, unspecified: Secondary | ICD-10-CM | POA: Diagnosis not present

## 2023-09-02 LAB — COMPREHENSIVE METABOLIC PANEL
ALT: 9 U/L (ref 0–44)
AST: 14 U/L — ABNORMAL LOW (ref 15–41)
Albumin: 2.9 g/dL — ABNORMAL LOW (ref 3.5–5.0)
Alkaline Phosphatase: 66 U/L (ref 38–126)
Anion gap: 12 (ref 5–15)
BUN: 104 mg/dL — ABNORMAL HIGH (ref 8–23)
CO2: 25 mmol/L (ref 22–32)
Calcium: 7.8 mg/dL — ABNORMAL LOW (ref 8.9–10.3)
Chloride: 96 mmol/L — ABNORMAL LOW (ref 98–111)
Creatinine, Ser: 8.49 mg/dL — ABNORMAL HIGH (ref 0.44–1.00)
GFR, Estimated: 4 mL/min — ABNORMAL LOW (ref >60)
Glucose, Bld: 89 mg/dL (ref 70–99)
Potassium: 4.8 mmol/L (ref 3.5–5.1)
Sodium: 133 mmol/L — ABNORMAL LOW (ref 135–145)
Total Bilirubin: 0.9 mg/dL (ref 0.3–1.2)
Total Protein: 5.9 g/dL — ABNORMAL LOW (ref 6.5–8.1)

## 2023-09-02 LAB — CBC
HCT: 25 % — ABNORMAL LOW (ref 36.0–46.0)
Hemoglobin: 8.2 g/dL — ABNORMAL LOW (ref 12.0–15.0)
MCH: 29.9 pg (ref 26.0–34.0)
MCHC: 32.8 g/dL (ref 30.0–36.0)
MCV: 91.2 fL (ref 80.0–100.0)
Platelets: 226 10*3/uL (ref 150–400)
RBC: 2.74 MIL/uL — ABNORMAL LOW (ref 3.87–5.11)
RDW: 15 % (ref 11.5–15.5)
WBC: 8 10*3/uL (ref 4.0–10.5)
nRBC: 0 % (ref 0.0–0.2)

## 2023-09-02 NOTE — Care Management Important Message (Signed)
Important Message  Patient Details  Name: [name] MRN: [id number] Date of Birth: 1946-04-02   Medicare Important Message Given:  Yes     Aislynn Cifelli Stefan Church 09/02/2023, 3:39 PM

## 2023-09-02 NOTE — Progress Notes (Addendum)
PROGRESS NOTE  [name]  AOZ:[id number] DOB: 1946-05-31 DOA: 08/29/2023 PCP: Myrlene Broker, MD   Brief Narrative: Patient is a 77 year old female with history of neck cancer status post laryngectomy now has ostomy, CKD stage V, history of CVA, DVT, hypertension, hypothyroidism, paroxysmal A-fib, peripheral artery disease, coronary artery disease, anxiety disorder, chronic anemia who presented with chest pain, shortness of breath.  Severely hypertensive on presentation and had to be sent to ICU for clevidipine drip.  Palliative care consulted for goals of care.  Cardiology was also following.  Overall status has significantly improved.  She has been weaned to room air today.  Discharge planning possible tomorrow to home after nephrology clearance  Assessment & Plan:  Principal Problem:   Volume overload Active Problems:   Paroxysmal atrial fibrillation (HCC)   Orthopnea   Coronary artery disease involving native coronary artery of native heart with refractory angina pectoris (HCC)   Chest pain   Hypertensive emergency   ESRD (end stage renal disease) (HCC)   GAD (generalized anxiety disorder)   Peripheral arterial disease (HCC)   Hypothyroidism   History of DVT (deep vein thrombosis)   Chronic anemia   History of CVA (cerebrovascular accident)   NSTEMI (non-ST elevated myocardial infarction) (HCC)   Acute on chronic heart failure with preserved ejection fraction (HFpEF) (HCC)   Hypertensive crisis   History of pharyngeal cancer   CKD (chronic kidney disease), symptom management only, stage 5 (HCC)   Chest pain: Presented with chest pain.  History of coronary disease.  CT chest showed three-vessel coronary artery calcification.  Currently chest pain has resolved.  Cardiology following.  No plan for invasive intervention.  Troponins elevated but flat trend, not consistent with ACS.  Does not complain of any chest pain today  Hypertensive urgency: Severely hypertensive  on presentation.  Started on clevidipine drip, nitroglycerin.  Currently on Coreg, clonidine, amlodipine, hydralazine.  Minoxidil stopped.   CKD stage V: Patient declining dialysis.  Nephrology following.  Continue bicarb tablets.  Does not look volume overloaded  Acute on chronic diastolic CHF exacerbation: Was On Lasix drip.  Takes torsemide 40 mg twice daily at home,resumed but now stopped again by nephrology  Paroxysmal A-fib/peripheral artery disease/CVA/DVT: On Eliquis for anticoagulation, is on aspirin.  On statin.  On Coreg for rate control.  Also on cilostazol for peripheral artery disease.Currently she is in normal sinus rhythm  Normocytic anemia: Chronic, likely history with ESRD.  Hemoglobin in the range of 8.  Received blood transfusion before this admission as an outpatient.  On Epogen  Anxiety disorder: Cymbalta, Ativan  Hypothyroidism: On levothyroxine  History of pharyngeal cancer: Status post tracheostomy  Deconditioning: PT saw her, no follow-up recommended  Goals of care: Elderly patient with multiple comorbidities.  Palliative care consulted and following.  CODE STATUS is DNR.  The goal of the patient and family is to go home        DVT prophylaxis:SCDs Start: 08/30/23 0308 Place TED hose Start: 08/30/23 0308 apixaban (ELIQUIS) tablet 2.5 mg Start: 08/30/23 0130 apixaban (ELIQUIS) tablet 2.5 mg     Code Status: Limited: Do not attempt resuscitation (DNR) -DNR-LIMITED -Do Not Intubate/DNI   Family Communication: Called and discussed with daughter-in-law phone on phone on 9/19  Patient status: Inpatient  Patient is from : Home  Anticipated discharge to: Home  Estimated DC date: Tomorrow   Consultants: PCCM, cardiology, Palliative care  Procedures: None  Antimicrobials:  Anti-infectives (From admission, onward)    None  Subjective: Patient seen and examined at bedside today.  Hemodynamically stable.  Lying in bed.  Appears comfortable.   Denies any complaints.  Was on 6 L of oxygen per minute this morning which has been weaned to room air.  Plan to ambulate the patient on the hallway..  Objective: Vitals:   09/02/23 0847 09/02/23 0933 09/02/23 1018 09/02/23 1129  BP:  134/64    Pulse: 75   67  Resp: 19  15 14   Temp:      TempSrc:      SpO2: 96% 100% 94% 91%  Weight:      Height:        Intake/Output Summary (Last 24 hours) at 09/02/2023 1259 Last data filed at 09/02/2023 1000 Gross per 24 hour  Intake 240 ml  Output 700 ml  Net -460 ml   Filed Weights   08/31/23 0409 09/01/23 0500 09/02/23 0500  Weight: 50.4 kg 51.2 kg 51.3 kg    Examination:  General exam: Overall comfortable, not in distress, appears deconditioned HEENT: PERRL, tracheal stoma Respiratory system:  no wheezes or crackles  Cardiovascular system: S1 & S2 heard, RRR.  Gastrointestinal system: Abdomen is nondistended, soft and nontender. Central nervous system: Alert and oriented Extremities: No edema, no clubbing ,no cyanosis Skin: No rashes, no ulcers,no icterus     Data Reviewed: I have personally reviewed following labs and imaging studies  CBC: Recent Labs  Lab 08/29/23 2224 08/30/23 0603 08/31/23 0029 09/01/23 0333 09/02/23 0459  WBC 7.7 7.5 9.3 7.4 8.0  HGB 8.3* 7.9* 9.4* 8.4* 8.2*  HCT 26.5* 25.8* 28.6* 26.5* 25.0*  MCV 90.8 93.5 91.1 90.4 91.2  PLT 220 215 232 244 226   Basic Metabolic Panel: Recent Labs  Lab 08/30/23 0603 08/31/23 0029 08/31/23 1627 09/01/23 0333 09/02/23 0459  NA 139 135 135 133* 133*  K 4.4 4.4 4.1 4.6 4.8  CL 102 97* 95* 95* 96*  CO2 20* 22 24 26 25   GLUCOSE 103* 143* 114* 99 89  BUN 96* 92* 93* 97* 104*  CREATININE 7.14* 7.19* 7.60* 7.86* 8.49*  CALCIUM 8.8* 9.0 8.8* 8.5* 7.8*  MG  --  1.7  --   --   --      Recent Results (from the past 240 hour(s))  MRSA Next Gen by PCR, Nasal     Status: Abnormal   Collection Time: 08/30/23  9:53 PM   Specimen: Nasal Mucosa; Nasal Swab  Result  Value Ref Range Status   MRSA by PCR Next Gen DETECTED (A) NOT DETECTED Final    Comment: RESULT CALLED TO, READ BACK BY AND VERIFIED WITH: B WARNER,RN@2338  08/30/23 MK (NOTE) The GeneXpert MRSA Assay (FDA approved for NASAL specimens only), is one component of a comprehensive MRSA colonization surveillance program. It is not intended to diagnose MRSA infection nor to guide or monitor treatment for MRSA infections. Test performance is not FDA approved in patients less than 32 years old. Performed at [facility], [address], [place], [place] [address]   Culture, BAL-quantitative w Gram Stain     Status: Abnormal (Preliminary result)   Collection Time: 08/31/23  4:34 AM   Specimen: Bronchoalveolar Lavage; Respiratory  Result Value Ref Range Status   Specimen Description BRONCHIAL ALVEOLAR LAVAGE  Final   Special Requests NONE  Final   Gram Stain   Final    FEW WBC PRESENT,BOTH PMN AND MONONUCLEAR FEW YEAST WITH PSEUDOHYPHAE FEW GRAM POSITIVE COCCI IN PAIRS FEW GRAM NEGATIVE  RODS    Culture (A)  Final    >=100,000 COLONIES/mL ESCHERICHIA COLI CULTURE REINCUBATED FOR BETTER GROWTH Performed at [facility], [address], [place], [place] [address]    Report Status PENDING  Incomplete   Organism ID, Bacteria ESCHERICHIA COLI (A)  Final      Susceptibility   Escherichia coli - MIC*    AMPICILLIN >=32 RESISTANT Resistant     CEFEPIME <=0.12 SENSITIVE Sensitive     CEFTAZIDIME 4 SENSITIVE Sensitive     CEFTRIAXONE 4 RESISTANT Resistant     CIPROFLOXACIN <=0.25 SENSITIVE Sensitive     GENTAMICIN >=16 RESISTANT Resistant     IMIPENEM 0.5 SENSITIVE Sensitive     TRIMETH/SULFA <=20 SENSITIVE Sensitive     AMPICILLIN/SULBACTAM 16 INTERMEDIATE Intermediate     PIP/TAZO <=4 SENSITIVE Sensitive     * >=100,000 COLONIES/mL ESCHERICHIA COLI     Radiology Studies: No results found.  Scheduled Meds:  amLODipine  10 mg Oral Daily   apixaban  2.5 mg Oral BID    aspirin EC  81 mg Oral Daily   calcitRIOL  0.25 mcg Oral Daily   carvedilol  25 mg Oral BID WC   Chlorhexidine Gluconate Cloth  6 each Topical Daily   Chlorhexidine Gluconate Cloth  6 each Topical Q0600   cilostazol  100 mg Oral BID   cloNIDine  0.2 mg Oral BID   DULoxetine  30 mg Oral Daily   hydrALAZINE  100 mg Oral TID   levothyroxine  50 mcg Oral Q0600   mirtazapine  15 mg Oral QHS   mupirocin ointment  1 Application Nasal BID   pantoprazole  40 mg Oral Daily   rosuvastatin  20 mg Oral Daily   sodium bicarbonate  650 mg Oral TID   sodium chloride flush  3 mL Intravenous Q12H   Continuous Infusions:  sodium chloride       LOS: 3 days   Burnadette Pop, MD [facility] P9/19/2024, 12:59 PM

## 2023-09-02 NOTE — Plan of Care (Signed)
Problem: Activity: Goal: Ability to tolerate increased activity will improve Outcome: Progressing

## 2023-09-02 NOTE — Progress Notes (Signed)
Physical Therapy Treatment Patient Details Name: [name] MRN: [id number] DOB: [birth year] Today's Date: 09/02/2023   History of Present Illness Pt is a 77 y/o female admitted 9/15 with CP and SOB.  PMH includes neck CA, ostomy, CKD stage V, CVA, DVT, HTNpAfib, PAD    PT Comments  Pt received in bed, RT helping her clear secretions from stoma. Interpreter ipad used and therapist spoke with pt's son on the phone. Pt able to come to EOB without assist. Likes to grab a hand with sit>stand and refuses any AD. Pt ambulated 200' with several stagger steps to R and L but no full LOB. She also ascended 5 stairs safely with handrail. Family will be present for supervision at home. Pt ready for d/c home from PT standpoint. No f/u needs.     If plan is discharge home, recommend the following: Assist for transportation;Assistance with cooking/housework   Can travel by private vehicle        Equipment Recommendations  None recommended by PT    Recommendations for Other Services       Precautions / Restrictions Precautions Precautions: Fall Restrictions Weight Bearing Restrictions: No     Mobility  Bed Mobility Overal bed mobility: Modified Independent             General bed mobility comments: supine to sit and sit to supine without difficulty    Transfers Overall transfer level: Needs assistance Equipment used: 1 person hand held assist Transfers: Sit to/from Stand Sit to Stand: Min assist           General transfer comment: pt reaches for therapist's hand and declines RW, min HHA to steady    Ambulation/Gait Ambulation/Gait assistance: Contact guard assist Gait Distance (Feet): 200 Feet Assistive device: None Gait Pattern/deviations: Step-through pattern, Staggering left, Staggering right Gait velocity: WFL Gait velocity interpretation: >4.37 ft/sec, indicative of normal walking speed   General Gait Details: pt with multiple stagger steps but no full LOB.  Offered hand but pt declined and adamant that she will not use RW   Stairs Stairs: Yes Stairs assistance: Supervision Stair Management: One rail Right, Alternating pattern, Forwards Number of Stairs: 5 General stair comments: no difficulty on stairs and steady with use of rail   Wheelchair Mobility     Tilt Bed    Modified Rankin (Stroke Patients Only)       Balance Overall balance assessment: Mild deficits observed, not formally tested                                          Cognition Arousal: Alert Behavior During Therapy: WFL for tasks assessed/performed Overall Cognitive Status: Difficult to assess                                          Exercises      General Comments General comments (skin integrity, edema, etc.): VSS on RA. Pt relays that she really wants to go home      Pertinent Vitals/Pain Pain Assessment Pain Assessment: No/denies pain    Home Living                          Prior Function  PT Goals (current goals can now be found in the care plan section) Acute Rehab PT Goals Patient Stated Goal: back independent PT Goal Formulation: With patient Time For Goal Achievement: 09/15/23 Potential to Achieve Goals: Good Progress towards PT goals: Progressing toward goals    Frequency    Min 1X/week      PT Plan      Shaw-evaluation              AM-PAC PT "6 Clicks" Mobility   Outcome Measure  Help needed turning from your back to your side while in a flat bed without using bedrails?: None Help needed moving from lying on your back to sitting on the side of a flat bed without using bedrails?: None Help needed moving to and from a bed to a chair (including a wheelchair)?: None Help needed standing up from a chair using your arms (e.g., wheelchair or bedside chair)?: None Help needed to walk in hospital room?: A Little Help needed climbing 3-5 steps with a railing? : None 6  Click Score: 23    End of Session Equipment Utilized During Treatment: Gait belt Activity Tolerance: Patient tolerated treatment well Patient left: in bed;with call bell/phone within reach Nurse Communication: Mobility status PT Visit Diagnosis: Other abnormalities of gait and mobility (R26.89);Difficulty in walking, not elsewhere classified (R26.2)     Time: 1610-9604 PT Time Calculation (min) (ACUTE ONLY): 24 min  Charges:    $Gait Training: 23-37 mins PT General Charges $$ ACUTE PT VISIT: 1 Visit                     [name], PT  Acute Rehab Services Secure chat preferred Office [phone]    [name] [name] 09/02/2023, 1:55 PM

## 2023-09-02 NOTE — Plan of Care (Signed)
Problem: Education: Goal: Understanding of cardiac disease, CV risk reduction, and recovery process will improve Outcome: Adequate for Discharge Goal: Individualized Educational Video(s) Outcome: Adequate for Discharge   Problem: Activity: Goal: Ability to tolerate increased activity will improve Outcome: Adequate for Discharge   Problem: Cardiac: Goal: Ability to achieve and maintain adequate cardiovascular perfusion will improve Outcome: Adequate for Discharge   Problem: Health Behavior/Discharge Planning: Goal: Ability to safely manage health-related needs after discharge will improve Outcome: Adequate for Discharge   Problem: Education: Goal: Knowledge of General Education information will improve Description: Including pain rating scale, medication(s)/side effects and non-pharmacologic comfort measures Outcome: Adequate for Discharge   Problem: Health Behavior/Discharge Planning: Goal: Ability to manage health-related needs will improve Outcome: Adequate for Discharge   Problem: Clinical Measurements: Goal: Ability to maintain clinical measurements within normal limits will improve Outcome: Adequate for Discharge Goal: Will remain free from infection Outcome: Adequate for Discharge Goal: Diagnostic test results will improve Outcome: Adequate for Discharge Goal: Respiratory complications will improve Outcome: Adequate for Discharge Goal: Cardiovascular complication will be avoided Outcome: Adequate for Discharge   Problem: Activity: Goal: Risk for activity intolerance will decrease Outcome: Adequate for Discharge   Problem: Nutrition: Goal: Adequate nutrition will be maintained Outcome: Adequate for Discharge   Problem: Coping: Goal: Level of anxiety will decrease Outcome: Adequate for Discharge   Problem: Elimination: Goal: Will not experience complications related to bowel motility Outcome: Adequate for Discharge Goal: Will not experience complications  related to urinary retention Outcome: Adequate for Discharge   Problem: Pain Managment: Goal: General experience of comfort will improve Outcome: Adequate for Discharge   Problem: Safety: Goal: Ability to remain free from injury will improve Outcome: Adequate for Discharge   Problem: Skin Integrity: Goal: Risk for impaired skin integrity will decrease Outcome: Adequate for Discharge

## 2023-09-02 NOTE — Progress Notes (Signed)
[facility] Progress Note    Assessment/ Plan:   CKD5 -followed by me as an outpatient. Patient has had pre-existing wishes to not do dialysis which we have confirmed as an outpatient and as an inpatient -She has been exhibiting nausea/vomiting (chronic issue). Concern for uremia. Will certainly respect her wishes in regards to not pursuing dialysis and son is in agreement with this. Would recommend consideration of comfort care/hospice to maintain quality of life but she is not ready for hospice at this time. Appreciate palliative care's assistance. We also discussed home therapies to see if that is an option and patient does not want to pursue this -Cr up to 8.49 today, will hold diuretics today and monitor from there -Avoid nephrotoxic medications including NSAIDs and iodinated intravenous contrast exposure unless the latter is absolutely indicated.  Preferred narcotic agents for pain control are hydromorphone, fentanyl, and methadone. Morphine should not be used. Avoid Baclofen and avoid oral sodium phosphate and magnesium citrate based laxatives / bowel preps. Continue strict Input and Output monitoring. Will monitor the patient closely with you and intervene or adjust therapy as indicated by changes in clinical status/labs    Acute on chronic diastolic CHF exacerbation -s/p lasix gtt. Holding torsemide 40mg  BID today given rise in Cr, volume status is stable -cardiology consulted -palliative care consult as above   Chest pain, type 2 NSTEMI -off cleviprex &  NTG gtt -seen by cardio -unable to have card cath given CKD5  HTN emergency -improved -on clonidine 0.2mg  BID, coreg 25mg  BID, hydralazine 100mg  TID, amlodipine 10mg  daily -hold diuretics as above   Anemia of CKD -transfuse prn for hgb <7 -last dose of retacrit [address] units on 9/12 (receiving q2weeks) -seems to be fe replete based on panel from 9/12   Metabolic acidosis -c/w nahco3 supplementation, CO2  acceptable   H/o pharyngeal Ca s/p trach -per primary   H/o DVT -on eliquis    Subjective:   Patient seen and examined. Out of ICU now. UOP ~1.1L She gave me a thumbs up when I asked her how she is doing. Was able to signal to me that she walked around today.   Objective:   BP 134/64   Pulse 75   Temp 98.4 F (36.9 C) (Oral)   Resp 19   Ht 5' (1.524 m)   Wt 51.3 kg   SpO2 96%   BMI 22.09 kg/m   Intake/Output Summary (Last 24 hours) at 09/02/2023 0953 Last data filed at 09/02/2023 0602 Gross per 24 hour  Intake 259 ml  Output 1100 ml  Net -841 ml   Weight change: 0.1 kg  Physical Exam: Gen: NAD, comfortable CVS: RRR Resp: cta bl, normal wob, TC in place Abd: soft Ext: no sig edema b/l les Neuro: awake, alert  Imaging: No results found.  Labs: BMET Recent Labs  Lab 08/29/23 2224 08/30/23 0603 08/31/23 0029 08/31/23 1627 09/01/23 0333 09/02/23 0459  NA 140 139 135 135 133* 133*  K 4.6 4.4 4.4 4.1 4.6 4.8  CL 100 102 97* 95* 95* 96*  CO2 20* 20* 22 24 26 25   GLUCOSE 122* 103* 143* 114* 99 89  BUN 97* 96* 92* 93* 97* 104*  CREATININE 6.91* 7.14* 7.19* 7.60* 7.86* 8.49*  CALCIUM 9.1 8.8* 9.0 8.8* 8.5* 7.8*   CBC Recent Labs  Lab 08/30/23 0603 08/31/23 0029 09/01/23 0333 09/02/23 0459  WBC 7.5 9.3 7.4 8.0  HGB 7.9* 9.4* 8.4* 8.2*  HCT 25.8* 28.6* 26.5* 25.0*  MCV 93.5 91.1 90.4 91.2  PLT 215 232 244 226    Medications:     amLODipine  10 mg Oral Daily   apixaban  2.5 mg Oral BID   aspirin EC  81 mg Oral Daily   calcitRIOL  0.25 mcg Oral Daily   carvedilol  25 mg Oral BID WC   Chlorhexidine Gluconate Cloth  6 each Topical Daily   Chlorhexidine Gluconate Cloth  6 each Topical Q0600   cilostazol  100 mg Oral BID   cloNIDine  0.2 mg Oral BID   DULoxetine  30 mg Oral Daily   hydrALAZINE  100 mg Oral TID   levothyroxine  50 mcg Oral Q0600   mirtazapine  15 mg Oral QHS   mupirocin ointment  1 Application Nasal BID   pantoprazole  40 mg  Oral Daily   rosuvastatin  20 mg Oral Daily   sodium bicarbonate  650 mg Oral TID   sodium chloride flush  3 mL Intravenous Q12H      Anthony Sar, MD [facility] 09/02/2023, 9:53 AM

## 2023-09-02 NOTE — Progress Notes (Signed)
Consulted with Vonzell Schlatter. RRT about trach care for stoma and suctioning. Confirmed with charge RRT,that patient is not to be deep suctioned nor are we to insert suctioning apparatus([employer]ankauer) into patient stoma. Patient is to be encouraged to cough up secretions.

## 2023-09-03 ENCOUNTER — Inpatient Hospital Stay (HOSPITAL_COMMUNITY): Payer: Medicare Other

## 2023-09-03 DIAGNOSIS — E8770 Fluid overload, unspecified: Secondary | ICD-10-CM | POA: Diagnosis not present

## 2023-09-03 DIAGNOSIS — Z515 Encounter for palliative care: Secondary | ICD-10-CM | POA: Diagnosis not present

## 2023-09-03 DIAGNOSIS — I169 Hypertensive crisis, unspecified: Secondary | ICD-10-CM | POA: Diagnosis not present

## 2023-09-03 DIAGNOSIS — Z7189 Other specified counseling: Secondary | ICD-10-CM | POA: Diagnosis not present

## 2023-09-03 LAB — COMPREHENSIVE METABOLIC PANEL
ALT: 8 U/L (ref 0–44)
AST: 16 U/L (ref 15–41)
Albumin: 2.8 g/dL — ABNORMAL LOW (ref 3.5–5.0)
Alkaline Phosphatase: 66 U/L (ref 38–126)
Anion gap: 20 — ABNORMAL HIGH (ref 5–15)
BUN: 109 mg/dL — ABNORMAL HIGH (ref 8–23)
CO2: 20 mmol/L — ABNORMAL LOW (ref 22–32)
Calcium: 7.8 mg/dL — ABNORMAL LOW (ref 8.9–10.3)
Chloride: 93 mmol/L — ABNORMAL LOW (ref 98–111)
Creatinine, Ser: 9.24 mg/dL — ABNORMAL HIGH (ref 0.44–1.00)
GFR, Estimated: 4 mL/min — ABNORMAL LOW (ref >60)
Glucose, Bld: 176 mg/dL — ABNORMAL HIGH (ref 70–99)
Potassium: 4.4 mmol/L (ref 3.5–5.1)
Sodium: 133 mmol/L — ABNORMAL LOW (ref 135–145)
Total Bilirubin: 1 mg/dL (ref 0.3–1.2)
Total Protein: 5.9 g/dL — ABNORMAL LOW (ref 6.5–8.1)

## 2023-09-03 MED ORDER — FUROSEMIDE 10 MG/ML IJ SOLN
80.0000 mg | Freq: Two times a day (BID) | INTRAMUSCULAR | Status: AC
  Administered 2023-09-03 – 2023-09-04 (×2): 80 mg via INTRAVENOUS
  Filled 2023-09-03 (×2): qty 8

## 2023-09-03 MED ORDER — GUAIFENESIN 100 MG/5ML PO LIQD
10.0000 mL | ORAL | Status: DC | PRN
  Administered 2023-09-03 – 2023-09-04 (×3): 10 mL via ORAL
  Filled 2023-09-03 (×3): qty 15

## 2023-09-03 NOTE — Progress Notes (Signed)
[facility] Progress Note    Assessment/ Plan:   CKD5 -followed by me as an outpatient. Patient has had pre-existing wishes to not do dialysis which we have confirmed as an outpatient and as an inpatient -She has been exhibiting nausea/vomiting (chronic issue). Concern for uremia. Will certainly respect her wishes in regards to not pursuing dialysis and son is in agreement with this. Would recommend consideration of comfort care/hospice to maintain quality of life but she is not ready for hospice at this time as per palliative. Appreciate palliative care's assistance. We also discussed home therapies in great detail before to see if that is an option and patient does not want to pursue this -Cr up to 9.2 today. Regardless, her CXR is with persistent vasc congestion therefore will give lasix 80mg  IV BID (1 day). If labs are worsening and/or doesn't effectly diurese then will likely need to re-discuss hospice/comfort care -Avoid nephrotoxic medications including NSAIDs and iodinated intravenous contrast exposure unless the latter is absolutely indicated.  Preferred narcotic agents for pain control are hydromorphone, fentanyl, and methadone. Morphine should not be used. Avoid Baclofen and avoid oral sodium phosphate and magnesium citrate based laxatives / bowel preps. Continue strict Input and Output monitoring. Will monitor the patient closely with you and intervene or adjust therapy as indicated by changes in clinical status/labs    Acute on chronic diastolic CHF exacerbation -s/p lasix gtt. Given CXR today (vasc congestion), will give lasix 80mg  IV x 2 doses and reassess. Hopefully can transition to torsemide 40mg  bid eventually -cardiology consulted -palliative care consult as above   Chest pain, type 2 NSTEMI -off cleviprex &  NTG gtt -seen by cardio -unable to have card cath given CKD5  HTN emergency -improved, stable -on clonidine 0.2mg  BID, coreg 25mg  BID, hydralazine  100mg  TID, amlodipine 10mg  daily   Anemia of CKD -transfuse prn for hgb <7 -last dose of retacrit 13086 units on 9/12 (receiving q2weeks) -seems to be fe replete based on panel from 9/12   Metabolic acidosis -c/w nahco3 supplementation, CO2 acceptable   H/o pharyngeal Ca s/p trach -per primary   H/o DVT -on eliquis    Subjective:   Patient seen and examined. Patient's son Domingo Cocking on patient's cell phone (video chat). She reports feeling so-so. Has some coughing. No chest pain   Objective:   BP (!) 137/56 (BP Location: Right Arm)   Pulse 78   Temp 97.8 F (36.6 C) (Oral)   Resp 18   Ht 5' (1.524 m)   Wt 51.1 kg   SpO2 91%   BMI 22.00 kg/m   Intake/Output Summary (Last 24 hours) at 09/03/2023 1218 Last data filed at 09/03/2023 1000 Gross per 24 hour  Intake 660 ml  Output 200 ml  Net 460 ml   Weight change:   Physical Exam: Gen: NAD, comfortable CVS: RRR Resp: very fine crackles bibasilar, normal wob, trace site c/d/i Abd: soft Ext: no sig edema b/l les Neuro: awake, alert  Imaging: DG CHEST PORT 1 VIEW  Result Date: 09/03/2023 CLINICAL DATA:  141880 SOB (shortness of breath) [id number] EXAM: PORTABLE CHEST 1 VIEW COMPARISON:  CXR 08/29/23 FINDINGS: No pleural effusion. Pneumothorax. Cardiomegaly. There are prominent bilateral interstitial opacities likely represent pulmonary venous congestion. No radiographically apparent displaced rib fractures. Visualized upper abdomen is unremarkable. IMPRESSION: Cardiomegaly with pulmonary venous congestion. Electronically Signed   By: Lorenza Cambridge M.D.   On: 09/03/2023 10:51    Labs: BMET Recent Labs  Lab 08/29/23 2224 08/30/23  1610 08/31/23 0029 08/31/23 1627 09/01/23 0333 09/02/23 0459 09/03/23 0907  NA 140 139 135 135 133* 133* 133*  K 4.6 4.4 4.4 4.1 4.6 4.8 4.4  CL 100 102 97* 95* 95* 96* 93*  CO2 20* 20* 22 24 26 25  20*  GLUCOSE 122* 103* 143* 114* 99 89 176*  BUN 97* 96* 92* 93* 97* 104* 109*  CREATININE  6.91* 7.14* 7.19* 7.60* 7.86* 8.49* 9.24*  CALCIUM 9.1 8.8* 9.0 8.8* 8.5* 7.8* 7.8*   CBC Recent Labs  Lab 08/30/23 0603 08/31/23 0029 09/01/23 0333 09/02/23 0459  WBC 7.5 9.3 7.4 8.0  HGB 7.9* 9.4* 8.4* 8.2*  HCT 25.8* 28.6* 26.5* 25.0*  MCV 93.5 91.1 90.4 91.2  PLT 215 232 244 226    Medications:     amLODipine  10 mg Oral Daily   apixaban  2.5 mg Oral BID   calcitRIOL  0.25 mcg Oral Daily   carvedilol  25 mg Oral BID WC   Chlorhexidine Gluconate Cloth  6 each Topical Daily   Chlorhexidine Gluconate Cloth  6 each Topical Q0600   cilostazol  100 mg Oral BID   cloNIDine  0.2 mg Oral BID   DULoxetine  30 mg Oral Daily   furosemide  80 mg Intravenous BID   hydrALAZINE  100 mg Oral TID   levothyroxine  50 mcg Oral Q0600   mirtazapine  15 mg Oral QHS   mupirocin ointment  1 Application Nasal BID   pantoprazole  40 mg Oral Daily   rosuvastatin  20 mg Oral Daily   sodium bicarbonate  650 mg Oral TID   sodium chloride flush  3 mL Intravenous Q12H      Anthony Sar, MD [facility] 09/03/2023, 12:18 PM

## 2023-09-03 NOTE — Consult Note (Signed)
[facility]  [facility] Inpatient Consult   09/03/2023  [name] 1946-04-14 [id number]  [facility] [[facility]]  Accountable Care Organization [ACO] Patient: Medicare ACO Reach  Primary Care Provider: Myrlene Broker, MD, with [place] [facility] this provider is listed for the transition of care follow up appointments and calls  The patient was screened for with noted extreme risk score for unplanned readmission risk 2 hospital admissions in 6 months.  The patient was assessed for potential [facility]) Care Management service needs for post hospital transition for care coordination. Review of patient's electronic medical record reveals patient is being consulted for comfort/hospice referral.   Plan: [facility] Liaison will continue to follow progress and disposition to asess for post hospital community care coordination/management needs.  Referral request for community care coordination: no current community care coordination noted.   Community Care Management/Population Health does not replace or interfere with any arrangements made by the Inpatient Transition of Care team.   For questions contact:   Charlesetta Shanks, RN, BSN, CCM [place]  [facility] Liaison Direct Dial: [phone] or secure chat Website: [email or web address]

## 2023-09-03 NOTE — Progress Notes (Signed)
PROGRESS NOTE  [name]  KZS:[id number] DOB: 02-28-1946 DOA: 08/29/2023 PCP: Myrlene Broker, MD   Brief Narrative: Patient is a 77 year old female with history of neck cancer status post laryngectomy now has ostomy, CKD stage V, history of CVA, DVT, hypertension, hypothyroidism, paroxysmal A-fib, peripheral artery disease, coronary artery disease, anxiety disorder, chronic anemia who presented with chest pain, shortness of breath.  Severely hypertensive on presentation and had to be sent to ICU for clevidipine drip.  Palliative care consulted for goals of care.  Cardiology was also following.  Overall status has significantly improved but her kidney function is worsening..  She has been weaned to room air .  Nephrology planning to talk with the patient and family  Assessment & Plan:  Principal Problem:   Volume overload Active Problems:   Paroxysmal atrial fibrillation (HCC)   Orthopnea   Coronary artery disease involving native coronary artery of native heart with refractory angina pectoris (HCC)   Chest pain   Hypertensive emergency   ESRD (end stage renal disease) (HCC)   GAD (generalized anxiety disorder)   Peripheral arterial disease (HCC)   Hypothyroidism   History of DVT (deep vein thrombosis)   Chronic anemia   History of CVA (cerebrovascular accident)   NSTEMI (non-ST elevated myocardial infarction) (HCC)   Acute on chronic heart failure with preserved ejection fraction (HFpEF) (HCC)   Hypertensive crisis   History of pharyngeal cancer   CKD (chronic kidney disease), symptom management only, stage 5 (HCC)   Chest pain: Presented with chest pain.  History of coronary disease.  CT chest showed three-vessel coronary artery calcification.  Currently chest pain has resolved.  Cardiology following.  No plan for invasive intervention.  Troponins elevated but flat trend, not consistent with ACS.  Does not complain of any chest pain today  Hypertensive urgency:  Severely hypertensive on presentation.  Started on clevidipine drip, nitroglycerin.  Currently on Coreg, clonidine, amlodipine, hydralazine.  Minoxidil stopped.   CKD stage V: Patient declining dialysis.  Nephrology following.  Continue bicarb tablets.  Does not look volume overloaded but creatinine is trending up.  Chest x-ray showed venous congestion  Acute on chronic diastolic CHF exacerbation: Was On Lasix drip.  Takes torsemide 40 mg twice daily at home,resumed but now stopped again by nephrology  Paroxysmal A-fib/peripheral artery disease/CVA/DVT: On Eliquis for anticoagulation, is on aspirin.  On statin.  On Coreg for rate control.  Also on cilostazol for peripheral artery disease.Currently she is in normal sinus rhythm  Normocytic anemia: Chronic, likely history with ESRD.  Hemoglobin in the range of 8.  Received blood transfusion before this admission as an outpatient.  On Epogen  Anxiety disorder: Cymbalta, Ativan  Hypothyroidism: On levothyroxine  History of pharyngeal cancer: Status post tracheostomy  Deconditioning: PT saw her, no follow-up recommended  Goals of care: Elderly patient with multiple comorbidities.  Palliative care consulted and following.  CODE STATUS is DNR.  The goal of the patient and family is to go home        DVT prophylaxis:SCDs Start: 08/30/23 0308 Place TED hose Start: 08/30/23 0308 apixaban (ELIQUIS) tablet 2.5 mg Start: 08/30/23 0130 apixaban (ELIQUIS) tablet 2.5 mg     Code Status: Limited: Do not attempt resuscitation (DNR) -DNR-LIMITED -Do Not Intubate/DNI   Family Communication: Called and discussed with daughter-in-law phone on phone on 9/19  Patient status: Inpatient  Patient is from : Home  Anticipated discharge to: Home  Estimated DC date: After neprhology clearance   Consultants:  PCCM, cardiology, Palliative care  Procedures: None  Antimicrobials:  Anti-infectives (From admission, onward)    None        Subjective: Patient seen and examined at bedside today.  She is complaining of some cough.  Chest x-ray showed pulmonary venous congestion.  Remains on room air  Objective: Vitals:   09/03/23 0640 09/03/23 0713 09/03/23 0822 09/03/23 0853  BP: (!) 128/47  (!) 137/56   Pulse: 78  80 74  Resp:   20 18  Temp: 98.8 F (37.1 C)  97.8 F (36.6 C)   TempSrc: Oral  Oral   SpO2: (!) 88%  91% 91%  Weight:  51.1 kg    Height:        Intake/Output Summary (Last 24 hours) at 09/03/2023 1055 Last data filed at 09/03/2023 1000 Gross per 24 hour  Intake 660 ml  Output 200 ml  Net 460 ml   Filed Weights   09/01/23 0500 09/02/23 0500 09/03/23 0713  Weight: 51.2 kg 51.3 kg 51.1 kg    Examination:  General exam: Overall comfortable, not in distress, very deconditioned HEENT: Trach stoma Respiratory system:  no wheezes , mild bibasilar crackles, Cardiovascular system: S1 & S2 heard, RRR.  Gastrointestinal system: Abdomen is nondistended, soft and nontender. Central nervous system: Alert and oriented Extremities: No edema, no clubbing ,no cyanosis Skin: No rashes, no ulcers,no icterus     Data Reviewed: I have personally reviewed following labs and imaging studies  CBC: Recent Labs  Lab 08/29/23 2224 08/30/23 0603 08/31/23 0029 09/01/23 0333 09/02/23 0459  WBC 7.7 7.5 9.3 7.4 8.0  HGB 8.3* 7.9* 9.4* 8.4* 8.2*  HCT 26.5* 25.8* 28.6* 26.5* 25.0*  MCV 90.8 93.5 91.1 90.4 91.2  PLT 220 215 232 244 226   Basic Metabolic Panel: Recent Labs  Lab 08/31/23 0029 08/31/23 1627 09/01/23 0333 09/02/23 0459 09/03/23 0907  NA 135 135 133* 133* 133*  K 4.4 4.1 4.6 4.8 4.4  CL 97* 95* 95* 96* 93*  CO2 22 24 26 25  20*  GLUCOSE 143* 114* 99 89 176*  BUN 92* 93* 97* 104* 109*  CREATININE 7.19* 7.60* 7.86* 8.49* 9.24*  CALCIUM 9.0 8.8* 8.5* 7.8* 7.8*  MG 1.7  --   --   --   --      Recent Results (from the past 240 hour(s))  MRSA Next Gen by PCR, Nasal     Status: Abnormal    Collection Time: 08/30/23  9:53 PM   Specimen: Nasal Mucosa; Nasal Swab  Result Value Ref Range Status   MRSA by PCR Next Gen DETECTED (A) NOT DETECTED Final    Comment: RESULT CALLED TO, READ BACK BY AND VERIFIED WITH: B WARNER,RN@2338  08/30/23 MK (NOTE) The GeneXpert MRSA Assay (FDA approved for NASAL specimens only), is one component of a comprehensive MRSA colonization surveillance program. It is not intended to diagnose MRSA infection nor to guide or monitor treatment for MRSA infections. Test performance is not FDA approved in patients less than 76 years old. Performed at [facility], [address] [place], [place] [address]   Culture, BAL-quantitative w Gram Stain     Status: Abnormal (Preliminary result)   Collection Time: 08/31/23  4:34 AM   Specimen: Bronchoalveolar Lavage; Respiratory  Result Value Ref Range Status   Specimen Description BRONCHIAL ALVEOLAR LAVAGE  Final   Special Requests NONE  Final   Gram Stain   Final    FEW WBC PRESENT,BOTH PMN AND MONONUCLEAR FEW  YEAST WITH PSEUDOHYPHAE FEW GRAM POSITIVE COCCI IN PAIRS FEW GRAM NEGATIVE RODS    Culture (A)  Final    >=100,000 COLONIES/mL ESCHERICHIA COLI CULTURE REINCUBATED FOR BETTER GROWTH Performed at [facility], [address] [place], [place] [address]    Report Status PENDING  Incomplete   Organism ID, Bacteria ESCHERICHIA COLI (A)  Final      Susceptibility   Escherichia coli - MIC*    AMPICILLIN >=32 RESISTANT Resistant     CEFEPIME <=0.12 SENSITIVE Sensitive     CEFTAZIDIME 4 SENSITIVE Sensitive     CEFTRIAXONE 4 RESISTANT Resistant     CIPROFLOXACIN <=0.25 SENSITIVE Sensitive     GENTAMICIN >=16 RESISTANT Resistant     IMIPENEM 0.5 SENSITIVE Sensitive     TRIMETH/SULFA <=20 SENSITIVE Sensitive     AMPICILLIN/SULBACTAM 16 INTERMEDIATE Intermediate     PIP/TAZO <=4 SENSITIVE Sensitive     * >=100,000 COLONIES/mL ESCHERICHIA COLI     Radiology Studies: DG CHEST PORT 1  VIEW  Result Date: 09/03/2023 CLINICAL DATA:  141880 SOB (shortness of breath) [id number] EXAM: PORTABLE CHEST 1 VIEW COMPARISON:  CXR 08/29/23 FINDINGS: No pleural effusion. Pneumothorax. Cardiomegaly. There are prominent bilateral interstitial opacities likely represent pulmonary venous congestion. No radiographically apparent displaced rib fractures. Visualized upper abdomen is unremarkable. IMPRESSION: Cardiomegaly with pulmonary venous congestion. Electronically Signed   By: Lorenza Cambridge M.D.   On: 09/03/2023 10:51    Scheduled Meds:  amLODipine  10 mg Oral Daily   apixaban  2.5 mg Oral BID   calcitRIOL  0.25 mcg Oral Daily   carvedilol  25 mg Oral BID WC   Chlorhexidine Gluconate Cloth  6 each Topical Daily   Chlorhexidine Gluconate Cloth  6 each Topical Q0600   cilostazol  100 mg Oral BID   cloNIDine  0.2 mg Oral BID   DULoxetine  30 mg Oral Daily   hydrALAZINE  100 mg Oral TID   levothyroxine  50 mcg Oral Q0600   mirtazapine  15 mg Oral QHS   mupirocin ointment  1 Application Nasal BID   pantoprazole  40 mg Oral Daily   rosuvastatin  20 mg Oral Daily   sodium bicarbonate  650 mg Oral TID   sodium chloride flush  3 mL Intravenous Q12H   Continuous Infusions:  sodium chloride       LOS: 4 days   Burnadette Pop, MD [facility] P9/20/2024, 10:55 AM

## 2023-09-03 NOTE — Progress Notes (Signed)
Heart Failure Navigator Progress Note  Assessed for Heart & Vascular TOC clinic readiness.  Patient does not meet criteria due to EF 60-65%, CKD V, Creat 8.49, patient declined HD, MD recommending palliative/ hospice care. .   Navigator will sign off at this time.   Rhae Hammock, BSN, Scientist, clinical (histocompatibility and immunogenetics) Only

## 2023-09-03 NOTE — Progress Notes (Signed)
Patient placed on aerosol trach collar due to low saturation of 84% on  room air. Patient placed on 35%/10L with improvement, patient was also placed on bedside monitor. RT will continue to monitor patient.

## 2023-09-03 NOTE — Plan of Care (Signed)
  Problem: Education: Goal: Understanding of cardiac disease, CV risk reduction, and recovery process will improve Outcome: Progressing Goal: Individualized Educational Video(s) Outcome: Progressing   Problem: Activity: Goal: Ability to tolerate increased activity will improve Outcome: Progressing   Problem: Health Behavior/Discharge Planning: Goal: Ability to safely manage health-related needs after discharge will improve Outcome: Progressing   Problem: Health Behavior/Discharge Planning: Goal: Ability to manage health-related needs will improve Outcome: Progressing   Problem: Clinical Measurements: Goal: Ability to maintain clinical measurements within normal limits will improve Outcome: Progressing Goal: Will remain free from infection Outcome: Progressing Goal: Diagnostic test results will improve Outcome: Progressing Goal: Respiratory complications will improve Outcome: Progressing Goal: Cardiovascular complication will be avoided Outcome: Progressing   Problem: Activity: Goal: Risk for activity intolerance will decrease Outcome: Progressing   Problem: Nutrition: Goal: Adequate nutrition will be maintained Outcome: Progressing   Problem: Coping: Goal: Level of anxiety will decrease Outcome: Progressing   Problem: Elimination: Goal: Will not experience complications related to bowel motility Outcome: Progressing Goal: Will not experience complications related to urinary retention Outcome: Progressing   Problem: Pain Managment: Goal: General experience of comfort will improve Outcome: Progressing   Problem: Safety: Goal: Ability to remain free from injury will improve Outcome: Progressing   Problem: Skin Integrity: Goal: Risk for impaired skin integrity will decrease Outcome: Progressing

## 2023-09-03 NOTE — Progress Notes (Addendum)
Daily Progress Note   Patient Name: [name]       Date: 09/03/2023 DOB: May 27, 1946  Age: 77 y.o. MRN#: [id number] Attending Physician: Burnadette Pop, MD Primary Care Physician: Myrlene Broker, MD Admit Date: 08/29/2023  Reason for Consultation/Follow-up: Establishing goals of care  Length of Stay: 4  Current Medications: Scheduled Meds:   amLODipine  10 mg Oral Daily   apixaban  2.5 mg Oral BID   calcitRIOL  0.25 mcg Oral Daily   carvedilol  25 mg Oral BID WC   Chlorhexidine Gluconate Cloth  6 each Topical Daily   Chlorhexidine Gluconate Cloth  6 each Topical Q0600   cilostazol  100 mg Oral BID   cloNIDine  0.2 mg Oral BID   DULoxetine  30 mg Oral Daily   hydrALAZINE  100 mg Oral TID   levothyroxine  50 mcg Oral Q0600   mirtazapine  15 mg Oral QHS   mupirocin ointment  1 Application Nasal BID   pantoprazole  40 mg Oral Daily   rosuvastatin  20 mg Oral Daily   sodium bicarbonate  650 mg Oral TID   sodium chloride flush  3 mL Intravenous Q12H    Continuous Infusions:  sodium chloride      PRN Meds: sodium chloride, acetaminophen **OR** acetaminophen, guaiFENesin, HYDROmorphone (DILAUDID) injection, ipratropium-albuterol, LORazepam, ondansetron **OR** ondansetron (ZOFRAN) IV, prochlorperazine, sodium chloride flush  Physical Exam Neck:     Comments: stoma Cardiovascular:     Rate and Rhythm: Normal rate.  Pulmonary:     Effort: Pulmonary effort is normal.  Skin:    General: Skin is warm and dry.  Neurological:     Mental Status: She is alert and oriented to person, place, and time.  Psychiatric:        Mood and Affect: Mood normal.        Behavior: Behavior normal.             Vital Signs: BP (!) 137/56 (BP Location: Right Arm)   Pulse 74    Temp 97.8 F (36.6 C) (Oral)   Resp 18   Ht 5' (1.524 m)   Wt 51.1 kg   SpO2 91%   BMI 22.00 kg/m  SpO2: SpO2: 91 % O2 Device: O2 Device: Room Air O2 Flow Rate: O2 Flow Rate (L/min):  6 L/min   Patient Active Problem List   Diagnosis Date Noted   Coronary artery disease involving native coronary artery of native heart with refractory angina pectoris (HCC) 08/30/2023   Hypertensive crisis 08/30/2023   Volume overload 08/30/2023   Chest pain 08/30/2023   Hypertensive emergency 08/30/2023   ESRD (end stage renal disease) (HCC) 08/30/2023   History of pharyngeal cancer 08/30/2023   CKD (chronic kidney disease), symptom management only, stage 5 (HCC) 08/30/2023   Spondylosis of thoracolumbar region without myelopathy or radiculopathy 04/06/2023   Acute respiratory distress 04/06/2023   Acute on chronic heart failure with preserved ejection fraction (HFpEF) (HCC) 04/06/2023   Vision loss 11/17/2022   Pain, eye, left 11/17/2022   NSTEMI (non-ST elevated myocardial infarction) (HCC) 10/29/2022   Chronic diastolic (congestive) heart failure (HCC) 09/02/2022   Alaryngeal voice 08/13/2022   History of CVA (cerebrovascular accident) 08/13/2022   Orthopnea 08/07/2022   Facial swelling 01/30/2022   History of DVT (deep vein thrombosis) 01/05/2022   Chronic anemia 01/05/2022   Demand ischemia    History of head and neck cancer-s/p laryngectomy-with tracheostomy 01/02/2022   Paroxysmal atrial fibrillation (HCC) 01/02/2022   Neck pain 07/11/2021   Aortic atherosclerosis (HCC) 04/09/2021   Moderate protein-calorie malnutrition (HCC) 04/09/2021   Low back pain 01/23/2021   HLD (hyperlipidemia) 01/13/2021   Hypothyroidism 02/25/2020   Senile osteoporosis 02/15/2020   Peripheral arterial disease (HCC) 02/15/2020   Tinea corporis 02/15/2020   Gastroesophageal reflux disease without esophagitis 09/09/2019   Osteopenia 01/19/2019   CKD (chronic kidney disease) stage 5, GFR less than 15  ml/min (HCC) 01/19/2019   Diarrhea 11/04/2018   Thiamine deficiency 01/02/2018   Prediabetes 12/28/2017   Dietary folate deficiency anemia 12/28/2017   Moderate episode of recurrent major depressive disorder (HCC) 10/22/2017   S/P laryngectomy 11/03/2016   Stenosis of tracheal stoma 06/10/2016   Diverticulum of pharynx 08/21/2015   Esophageal obstruction 07/17/2015   Esophageal stricture 07/17/2015   Major depression, chronic 02/28/2015   Osteoporosis 02/01/2015   Dysphagia, pharyngoesophageal phase 05/22/2014   History of radiation therapy    Insomnia 02/07/2013   History of uterine cancer 11/24/2012   Chronic pain syndrome 09/03/2012   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 female  with past medical history of neck cancer status post laryngectomy now has ostomy, CKD stage V, history of CVA, history of DVT, essential hypertension, hypothyroidism, paroxysmal atrial fibrillation,  peripheral arterial disease, coronary artery disease generalized anxiety disorder, MDD, and chronic anemia admitted on 08/29/2023 with chest pain, shortness of breath, and nausea.   Assessment: Patient laying in bed, she is out of the ICU. She states she feels so-so today. Her nausea has resolved. Her goal remains to get home with her family and she is hopeful she will go home today. She has no concerns at this time.   Encouraged patient to call or have her sons call PMT with any concerns or questions.  Recommendations/Plan: DNR/DNI Continue to treat the treatable Continued discussion between family and patient re: goals of care Outpatient palliative care consult Continued PMT support: family will reach out with needs    Code Status:    Code Status Orders  (From admission, onward)           Start     Ordered   08/30/23 0306  Do not  attempt  resuscitation (DNR)- Limited -Do Not Intubate (DNI)  Continuous       Question Answer Comment  If pulseless and not breathing No CPR or chest compressions.   In Pre-Arrest Conditions (Patient Is Breathing and Has A Pulse) Do not intubate. Provide all appropriate non-invasive medical interventions. Avoid ICU transfer unless indicated or required.   Consent: Discussion documented in EHR or advanced directives reviewed      08/30/23 0307            Extensive chart review has been completed prior to meeting with the patient including labs, vital signs, imaging, progress/consult notes, orders, medications, and available advance directive documents.   Time spent: 25 minutes  Thank you for allowing the Palliative Medicine Team to assist in the care of this patient.   Sherryll Burger, NP  Please contact Palliative Medicine Team phone at [phone] for questions and concerns.

## 2023-09-04 DIAGNOSIS — E8770 Fluid overload, unspecified: Secondary | ICD-10-CM | POA: Diagnosis not present

## 2023-09-04 LAB — BASIC METABOLIC PANEL
Anion gap: 19 — ABNORMAL HIGH (ref 5–15)
BUN: 115 mg/dL — ABNORMAL HIGH (ref 8–23)
CO2: 22 mmol/L (ref 22–32)
Calcium: 7.9 mg/dL — ABNORMAL LOW (ref 8.9–10.3)
Chloride: 92 mmol/L — ABNORMAL LOW (ref 98–111)
Creatinine, Ser: 9.2 mg/dL — ABNORMAL HIGH (ref 0.44–1.00)
GFR, Estimated: 4 mL/min — ABNORMAL LOW (ref >60)
Glucose, Bld: 110 mg/dL — ABNORMAL HIGH (ref 70–99)
Potassium: 5 mmol/L (ref 3.5–5.1)
Sodium: 133 mmol/L — ABNORMAL LOW (ref 135–145)

## 2023-09-04 LAB — CBC
HCT: 25.4 % — ABNORMAL LOW (ref 36.0–46.0)
Hemoglobin: 8.3 g/dL — ABNORMAL LOW (ref 12.0–15.0)
MCH: 29.1 pg (ref 26.0–34.0)
MCHC: 32.7 g/dL (ref 30.0–36.0)
MCV: 89.1 fL (ref 80.0–100.0)
Platelets: 209 10*3/uL (ref 150–400)
RBC: 2.85 MIL/uL — ABNORMAL LOW (ref 3.87–5.11)
RDW: 14.8 % (ref 11.5–15.5)
WBC: 10.2 10*3/uL (ref 4.0–10.5)
nRBC: 0 % (ref 0.0–0.2)

## 2023-09-04 LAB — CULTURE, BAL-QUANTITATIVE W GRAM STAIN: Culture: >=100000 — AB

## 2023-09-04 MED ORDER — FUROSEMIDE 10 MG/ML IJ SOLN
80.0000 mg | Freq: Two times a day (BID) | INTRAMUSCULAR | Status: AC
  Administered 2023-09-04 – 2023-09-05 (×2): 80 mg via INTRAVENOUS
  Filled 2023-09-04 (×2): qty 8

## 2023-09-04 NOTE — Progress Notes (Signed)
[facility] Progress Note    Assessment/ Plan:   CKD5 -followed by me as an outpatient. Patient has had pre-existing wishes to not do dialysis which we have confirmed as an outpatient and as an inpatient -She has been exhibiting nausea/vomiting (chronic issue). Concern for uremia. Will certainly respect her wishes in regards to not pursuing dialysis and son is in agreement with this. Would recommend consideration of comfort care/hospice to maintain quality of life but she is not ready for hospice at this time as per palliative. Appreciate palliative care's assistance. We also discussed home therapies in great detail before to see if that is an option and patient does not want to pursue this -Cr stable 9.2 today. Regardless, her CXR 9/20 is with persistent vasc congestion and still has bibasilar crackles therefore will give another lasix 80mg  IV BID (1 day). If labs are worsening and/or doesn't effectly diurese then will likely need to re-discuss hospice/comfort care (will rediscuss with palliative care if that is the case) -Avoid nephrotoxic medications including NSAIDs and iodinated intravenous contrast exposure unless the latter is absolutely indicated.  Preferred narcotic agents for pain control are hydromorphone, fentanyl, and methadone. Morphine should not be used. Avoid Baclofen and avoid oral sodium phosphate and magnesium citrate based laxatives / bowel preps. Continue strict Input and Output monitoring. Will monitor the patient closely with you and intervene or adjust therapy as indicated by changes in clinical status/labs    Acute on chronic diastolic CHF exacerbation -s/p lasix gtt. Given CXR today (vasc congestion), will give lasix 80mg  IV x 2 doses again and reassess. Hopefully can transition to torsemide 40mg  bid eventually -cardiology consulted -palliative care consult as above   Chest pain, type 2 NSTEMI -off cleviprex &  NTG gtt -seen by cardio -unable to have  card cath given CKD5  HTN emergency -improved, stable -on clonidine 0.2mg  BID, coreg 25mg  BID, hydralazine 100mg  TID, amlodipine 10mg  daily   Anemia of CKD -transfuse prn for hgb <7 -last dose of retacrit 24401 units on 9/12 (receiving q2weeks) -seems to be fe replete based on panel from 9/12   Metabolic acidosis -c/w nahco3 supplementation, CO2 acceptable   H/o pharyngeal Ca s/p trach -per primary   H/o DVT -on eliquis  Discussed with patient's son Simonne Come over the phone.    Subjective:   Patient seen and examined. No acute events. Upon prompted, she denies having any more N/V. She denies any CP or worsening SOB or persistent cough She gave me a thumbs up each time. Uop charted not accurate (?1cc? and 4 unmeasured voids)   Objective:   BP (!) 102/50 (BP Location: Right Arm)   Pulse 69   Temp 98.2 F (36.8 C) (Oral)   Resp 20   Ht 5' (1.524 m)   Wt 51.1 kg   SpO2 94%   BMI 22.00 kg/m   Intake/Output Summary (Last 24 hours) at 09/04/2023 1223 Last data filed at 09/04/2023 0954 Gross per 24 hour  Intake 460 ml  Output 1 ml  Net 459 ml   Weight change:   Physical Exam: Gen: NAD, comfortable CVS: RRR Resp: fine crackles bibasilar, normal wob, trace site c/d/i Abd: soft Ext: no sig edema b/l les Neuro: awake, alert  Imaging: DG CHEST PORT 1 VIEW  Result Date: 09/03/2023 CLINICAL DATA:  141880 SOB (shortness of breath) [id number] EXAM: PORTABLE CHEST 1 VIEW COMPARISON:  CXR 08/29/23 FINDINGS: No pleural effusion. Pneumothorax. Cardiomegaly. There are prominent bilateral interstitial opacities likely represent pulmonary venous  congestion. No radiographically apparent displaced rib fractures. Visualized upper abdomen is unremarkable. IMPRESSION: Cardiomegaly with pulmonary venous congestion. Electronically Signed   By: Lorenza Cambridge M.D.   On: 09/03/2023 10:51    Labs: BMET Recent Labs  Lab 08/30/23 0603 08/31/23 0029 08/31/23 1627 09/01/23 0333 09/02/23 0459  09/03/23 0907 09/04/23 0557  NA 139 135 135 133* 133* 133* 133*  K 4.4 4.4 4.1 4.6 4.8 4.4 5.0  CL 102 97* 95* 95* 96* 93* 92*  CO2 20* 22 24 26 25  20* 22  GLUCOSE 103* 143* 114* 99 89 176* 110*  BUN 96* 92* 93* 97* 104* 109* 115*  CREATININE 7.14* 7.19* 7.60* 7.86* 8.49* 9.24* 9.20*  CALCIUM 8.8* 9.0 8.8* 8.5* 7.8* 7.8* 7.9*   CBC Recent Labs  Lab 08/31/23 0029 09/01/23 0333 09/02/23 0459 09/04/23 0557  WBC 9.3 7.4 8.0 10.2  HGB 9.4* 8.4* 8.2* 8.3*  HCT 28.6* 26.5* 25.0* 25.4*  MCV 91.1 90.4 91.2 89.1  PLT 232 244 226 209    Medications:     amLODipine  10 mg Oral Daily   apixaban  2.5 mg Oral BID   calcitRIOL  0.25 mcg Oral Daily   carvedilol  25 mg Oral BID WC   Chlorhexidine Gluconate Cloth  6 each Topical Daily   cilostazol  100 mg Oral BID   cloNIDine  0.2 mg Oral BID   DULoxetine  30 mg Oral Daily   furosemide  80 mg Intravenous BID   hydrALAZINE  100 mg Oral TID   levothyroxine  50 mcg Oral Q0600   mirtazapine  15 mg Oral QHS   pantoprazole  40 mg Oral Daily   rosuvastatin  20 mg Oral Daily   sodium bicarbonate  650 mg Oral TID   sodium chloride flush  3 mL Intravenous Q12H      Anthony Sar, MD [facility] 09/04/2023, 12:23 PM

## 2023-09-04 NOTE — Progress Notes (Signed)
PROGRESS NOTE  [name]  WUJ:[id number] DOB: 1946/06/03 DOA: 08/29/2023 PCP: Myrlene Broker, MD   Brief Narrative: Patient is a 77 year old female with history of neck cancer status post laryngectomy now has ostomy, CKD stage V, history of CVA, DVT, hypertension, hypothyroidism, paroxysmal A-fib, peripheral artery disease, coronary artery disease, anxiety disorder, chronic anemia who presented with chest pain, shortness of breath.  Severely hypertensive on presentation and had to be sent to ICU for clevidipine drip.  Palliative care consulted for goals of care.  Cardiology was also following.  Overall status has significantly improved but her kidney function is worsening.  She has been weaned to room air .  We are recommending hospice because of worsening kidney function.  Palliative care closely following and planning to discuss with family  Assessment & Plan:  Principal Problem:   Volume overload Active Problems:   Paroxysmal atrial fibrillation (HCC)   Orthopnea   Coronary artery disease involving native coronary artery of native heart with refractory angina pectoris (HCC)   Chest pain   Hypertensive emergency   ESRD (end stage renal disease) (HCC)   GAD (generalized anxiety disorder)   Peripheral arterial disease (HCC)   Hypothyroidism   History of DVT (deep vein thrombosis)   Chronic anemia   History of CVA (cerebrovascular accident)   NSTEMI (non-ST elevated myocardial infarction) (HCC)   Acute on chronic heart failure with preserved ejection fraction (HFpEF) (HCC)   Hypertensive crisis   History of pharyngeal cancer   CKD (chronic kidney disease), symptom management only, stage 5 (HCC)   Chest pain: Presented with chest pain.  History of coronary disease.  CT chest showed three-vessel coronary artery calcification.  Currently chest pain has resolved.  Cardiology following.  No plan for invasive intervention.  Troponins elevated but flat trend, not consistent with  ACS.  Does not complain of any chest pain today  Hypertensive urgency: Severely hypertensive on presentation.  Started on clevidipine drip, nitroglycerin.  Currently on Coreg, clonidine, amlodipine, hydralazine.  Minoxidil stopped.   CKD stage V: Patient declining dialysis.  Nephrology following.  Continue bicarb tablets.Chest x-ray showed venous congestion.  Worsening kidney function.  Hospice may be the only option for her.  Acute on chronic diastolic CHF exacerbation: Was On Lasix drip.  Takes torsemide 40 mg twice daily at home,resumed but now stopped again by nephrology  Paroxysmal A-fib/peripheral artery disease/CVA/DVT: On Eliquis for anticoagulation, is on aspirin.  On statin.  On Coreg for rate control.  Also on cilostazol for peripheral artery disease.Currently she is in normal sinus rhythm  Normocytic anemia: Chronic, likely history with ESRD.  Hemoglobin in the range of 8.  Received blood transfusion before this admission as an outpatient.  On Epogen  Anxiety disorder: Cymbalta, Ativan  Hypothyroidism: On levothyroxine  History of pharyngeal cancer: Status post tracheostomy  Deconditioning: PT saw her, no follow-up recommended  Goals of care: Elderly patient with multiple comorbidities.  Palliative care consulted and following.  CODE STATUS is DNR.  The goal of the patient and family is to go home.  Hospice would be the only option for her due to her declining kidney function        DVT prophylaxis:SCDs Start: 08/30/23 0308 Place TED hose Start: 08/30/23 0308 apixaban (ELIQUIS) tablet 2.5 mg Start: 08/30/23 0130 apixaban (ELIQUIS) tablet 2.5 mg     Code Status: Limited: Do not attempt resuscitation (DNR) -DNR-LIMITED -Do Not Intubate/DNI   Family Communication: Called and discussed with daughter-in-law phone on phone on  9/19  Patient status: Inpatient  Patient is from : Home  Anticipated discharge to: Home  Estimated DC date: Ongoing goals of care discussion with  family on decision on hospice   Consultants: PCCM, cardiology, Palliative care  Procedures: None  Antimicrobials:  Anti-infectives (From admission, onward)    None       Subjective: Patient seen and examined the bedside today.  Overall comfortable.  On 6 L of oxygen by the stoma.  Complains of some headache  Objective: Vitals:   09/04/23 0551 09/04/23 0753 09/04/23 0906 09/04/23 1128  BP: (!) 138/59 (!) 142/63    Pulse: 66 68 73 69  Resp: 19 16 14 20   Temp: 98.2 F (36.8 C)     TempSrc: Oral     SpO2: 97% 95% 96% 96%  Weight:      Height:        Intake/Output Summary (Last 24 hours) at 09/04/2023 1139 Last data filed at 09/04/2023 0954 Gross per 24 hour  Intake 520 ml  Output 1 ml  Net 519 ml   Filed Weights   09/01/23 0500 09/02/23 0500 09/03/23 0713  Weight: 51.2 kg 51.3 kg 51.1 kg    Examination:  General exam: Overall comfortable, not in distress, deconditioned, weak HEENT: Trach stoma Respiratory system:  no wheezes, mild basilar  crackles Cardiovascular system: S1 & S2 heard, RRR.  Gastrointestinal system: Abdomen is nondistended, soft and nontender. Central nervous system: Alert and oriented Extremities: No edema, no clubbing ,no cyanosis Skin: No rashes, no ulcers,no icterus     Data Reviewed: I have personally reviewed following labs and imaging studies  CBC: Recent Labs  Lab 08/30/23 0603 08/31/23 0029 09/01/23 0333 09/02/23 0459 09/04/23 0557  WBC 7.5 9.3 7.4 8.0 10.2  HGB 7.9* 9.4* 8.4* 8.2* 8.3*  HCT 25.8* 28.6* 26.5* 25.0* 25.4*  MCV 93.5 91.1 90.4 91.2 89.1  PLT 215 232 244 226 209   Basic Metabolic Panel: Recent Labs  Lab 08/31/23 0029 08/31/23 1627 09/01/23 0333 09/02/23 0459 09/03/23 0907 09/04/23 0557  NA 135 135 133* 133* 133* 133*  K 4.4 4.1 4.6 4.8 4.4 5.0  CL 97* 95* 95* 96* 93* 92*  CO2 22 24 26 25  20* 22  GLUCOSE 143* 114* 99 89 176* 110*  BUN 92* 93* 97* 104* 109* 115*  CREATININE 7.19* 7.60* 7.86* 8.49*  9.24* 9.20*  CALCIUM 9.0 8.8* 8.5* 7.8* 7.8* 7.9*  MG 1.7  --   --   --   --   --      Recent Results (from the past 240 hour(s))  MRSA Next Gen by PCR, Nasal     Status: Abnormal   Collection Time: 08/30/23  9:53 PM   Specimen: Nasal Mucosa; Nasal Swab  Result Value Ref Range Status   MRSA by PCR Next Gen DETECTED (A) NOT DETECTED Final    Comment: RESULT CALLED TO, READ BACK BY AND VERIFIED WITH: B WARNER,RN@2338  08/30/23 MK (NOTE) The GeneXpert MRSA Assay (FDA approved for NASAL specimens only), is one component of a comprehensive MRSA colonization surveillance program. It is not intended to diagnose MRSA infection nor to guide or monitor treatment for MRSA infections. Test performance is not FDA approved in patients less than 14 years old. Performed at [facility], [address] [place], [place] [address]   Culture, BAL-quantitative w Gram Stain     Status: Abnormal   Collection Time: 08/31/23  4:34 AM   Specimen: Bronchoalveolar Lavage; Respiratory  Result Value Ref Range Status   Specimen Description BRONCHIAL ALVEOLAR LAVAGE  Final   Special Requests NONE  Final   Gram Stain   Final    FEW WBC PRESENT,BOTH PMN AND MONONUCLEAR FEW YEAST WITH PSEUDOHYPHAE FEW GRAM POSITIVE COCCI IN PAIRS FEW GRAM NEGATIVE RODS Performed at [facility], [address], [place], [place] [address]    Culture (A)  Final    >=100,000 COLONIES/mL ESCHERICHIA COLI 40,000 COLONIES/mL PSEUDOMONAS AERUGINOSA    Report Status 09/04/2023 FINAL  Final   Organism ID, Bacteria ESCHERICHIA COLI (A)  Final   Organism ID, Bacteria PSEUDOMONAS AERUGINOSA (A)  Final      Susceptibility   Escherichia coli - MIC*    AMPICILLIN >=32 RESISTANT Resistant     CEFEPIME <=0.12 SENSITIVE Sensitive     CEFTAZIDIME 4 SENSITIVE Sensitive     CEFTRIAXONE 4 RESISTANT Resistant     CIPROFLOXACIN <=0.25 SENSITIVE Sensitive     GENTAMICIN >=16 RESISTANT Resistant     IMIPENEM 0.5 SENSITIVE  Sensitive     TRIMETH/SULFA <=20 SENSITIVE Sensitive     AMPICILLIN/SULBACTAM 16 INTERMEDIATE Intermediate     PIP/TAZO <=4 SENSITIVE Sensitive     * >=100,000 COLONIES/mL ESCHERICHIA COLI   Pseudomonas aeruginosa - MIC*    CEFTAZIDIME 4 SENSITIVE Sensitive     CIPROFLOXACIN <=0.25 SENSITIVE Sensitive     GENTAMICIN 4 SENSITIVE Sensitive     IMIPENEM 1 SENSITIVE Sensitive     PIP/TAZO 16 SENSITIVE Sensitive     CEFEPIME 4 SENSITIVE Sensitive     * 40,000 COLONIES/mL PSEUDOMONAS AERUGINOSA     Radiology Studies: DG CHEST PORT 1 VIEW  Result Date: 09/03/2023 CLINICAL DATA:  141880 SOB (shortness of breath) [id number] EXAM: PORTABLE CHEST 1 VIEW COMPARISON:  CXR 08/29/23 FINDINGS: No pleural effusion. Pneumothorax. Cardiomegaly. There are prominent bilateral interstitial opacities likely represent pulmonary venous congestion. No radiographically apparent displaced rib fractures. Visualized upper abdomen is unremarkable. IMPRESSION: Cardiomegaly with pulmonary venous congestion. Electronically Signed   By: Lorenza Cambridge M.D.   On: 09/03/2023 10:51    Scheduled Meds:  amLODipine  10 mg Oral Daily   apixaban  2.5 mg Oral BID   calcitRIOL  0.25 mcg Oral Daily   carvedilol  25 mg Oral BID WC   Chlorhexidine Gluconate Cloth  6 each Topical Daily   cilostazol  100 mg Oral BID   cloNIDine  0.2 mg Oral BID   DULoxetine  30 mg Oral Daily   hydrALAZINE  100 mg Oral TID   levothyroxine  50 mcg Oral Q0600   mirtazapine  15 mg Oral QHS   pantoprazole  40 mg Oral Daily   rosuvastatin  20 mg Oral Daily   sodium bicarbonate  650 mg Oral TID   sodium chloride flush  3 mL Intravenous Q12H   Continuous Infusions:  sodium chloride       LOS: 5 days   Burnadette Pop, MD [facility] P9/21/2024, 11:39 AM

## 2023-09-05 ENCOUNTER — Inpatient Hospital Stay (HOSPITAL_COMMUNITY): Payer: Medicare Other

## 2023-09-05 DIAGNOSIS — E8770 Fluid overload, unspecified: Secondary | ICD-10-CM | POA: Diagnosis not present

## 2023-09-05 LAB — BASIC METABOLIC PANEL
Anion gap: 18 — ABNORMAL HIGH (ref 5–15)
BUN: 116 mg/dL — ABNORMAL HIGH (ref 8–23)
CO2: 22 mmol/L (ref 22–32)
Calcium: 8 mg/dL — ABNORMAL LOW (ref 8.9–10.3)
Chloride: 94 mmol/L — ABNORMAL LOW (ref 98–111)
Creatinine, Ser: 8.73 mg/dL — ABNORMAL HIGH (ref 0.44–1.00)
GFR, Estimated: 4 mL/min — ABNORMAL LOW (ref >60)
Glucose, Bld: 121 mg/dL — ABNORMAL HIGH (ref 70–99)
Potassium: 4.6 mmol/L (ref 3.5–5.1)
Sodium: 134 mmol/L — ABNORMAL LOW (ref 135–145)

## 2023-09-05 MED ORDER — TORSEMIDE 20 MG PO TABS
40.0000 mg | ORAL_TABLET | Freq: Two times a day (BID) | ORAL | Status: DC
  Administered 2023-09-06 – 2023-09-07 (×3): 40 mg via ORAL
  Filled 2023-09-05 (×3): qty 2

## 2023-09-05 MED ORDER — ORAL CARE MOUTH RINSE: 15.0000 mL | OROMUCOSAL | Status: DC | PRN

## 2023-09-05 NOTE — Plan of Care (Signed)
  Problem: Activity: Goal: Ability to tolerate increased activity will improve Outcome: Progressing   Problem: Health Behavior/Discharge Planning: Goal: Ability to safely manage health-related needs after discharge will improve Outcome: Progressing   Problem: Health Behavior/Discharge Planning: Goal: Ability to manage health-related needs will improve Outcome: Progressing   Problem: Clinical Measurements: Goal: Ability to maintain clinical measurements within normal limits will improve Outcome: Progressing Goal: Will remain free from infection Outcome: Progressing Goal: Diagnostic test results will improve Outcome: Progressing Goal: Respiratory complications will improve Outcome: Progressing Goal: Cardiovascular complication will be avoided Outcome: Progressing   Problem: Activity: Goal: Risk for activity intolerance will decrease Outcome: Progressing   Problem: Nutrition: Goal: Adequate nutrition will be maintained Outcome: Progressing   Problem: Coping: Goal: Level of anxiety will decrease Outcome: Progressing   Problem: Elimination: Goal: Will not experience complications related to bowel motility Outcome: Progressing Goal: Will not experience complications related to urinary retention Outcome: Progressing   Problem: Pain Managment: Goal: General experience of comfort will improve Outcome: Progressing   Problem: Safety: Goal: Ability to remain free from injury will improve Outcome: Progressing   Problem: Skin Integrity: Goal: Risk for impaired skin integrity will decrease Outcome: Progressing

## 2023-09-05 NOTE — Progress Notes (Signed)
PROGRESS NOTE  [name]  ZOX:[id number] DOB: 1946/09/11 DOA: 08/29/2023 PCP: Myrlene Broker, MD   Brief Narrative: Patient is a 77 year old female with history of neck cancer status post laryngectomy now has ostomy, CKD stage V, history of CVA, DVT, hypertension, hypothyroidism, paroxysmal A-fib, peripheral artery disease, coronary artery disease, anxiety disorder, chronic anemia who presented with chest pain, shortness of breath.  Severely hypertensive on presentation and had to be sent to ICU for clevidipine drip.  Cardiology was also following.  Overall status has significantly improved but her kidney function worsened.    We are recommending hospice because of worsening kidney function.  Palliative care closely following for goals of care.  Assessment & Plan:  Principal Problem:   Volume overload Active Problems:   Paroxysmal atrial fibrillation (HCC)   Orthopnea   Coronary artery disease involving native coronary artery of native heart with refractory angina pectoris (HCC)   Chest pain   Hypertensive emergency   ESRD (end stage renal disease) (HCC)   GAD (generalized anxiety disorder)   Peripheral arterial disease (HCC)   Hypothyroidism   History of DVT (deep vein thrombosis)   Chronic anemia   History of CVA (cerebrovascular accident)   NSTEMI (non-ST elevated myocardial infarction) (HCC)   Acute on chronic heart failure with preserved ejection fraction (HFpEF) (HCC)   Hypertensive crisis   History of pharyngeal cancer   CKD (chronic kidney disease), symptom management only, stage 5 (HCC)   Chest pain: Presented with chest pain.  History of coronary disease.  CT chest showed three-vessel coronary artery calcification.  Currently chest pain has resolved.  Cardiology following.  No plan for invasive intervention.  Troponins elevated but flat trend, not consistent with ACS.  Does not complain of any chest pain today  Hypertensive urgency: Severely hypertensive on  presentation.  Started on clevidipine drip, nitroglycerin.  Currently on Coreg, clonidine, amlodipine, hydralazine.  Minoxidil stopped.   CKD stage V: Patient declining dialysis.  Nephrology following.  Continue bicarb tablets.Chest x-ray on 9/20 showed venous congestion.  Kidney function worsened so given few days of Lasix by nephrology. hospice may be good option for her.  Acute on chronic diastolic CHF exacerbation: Takes torsemide 40 mg twice daily at home.  Given IV Lasix.  Volume status looks improved today  Paroxysmal A-fib/peripheral artery disease/CVA/DVT: On Eliquis for anticoagulation, is on aspirin.  On statin.  On Coreg for rate control.  Also on cilostazol for peripheral artery disease.Currently she is in normal sinus rhythm  Normocytic anemia: Chronic, likely history with ESRD.  Hemoglobin in the range of 8.  Received blood transfusion before this admission as an outpatient.  On Epogen  Anxiety disorder: Cymbalta, Ativan  Hypothyroidism: On levothyroxine  History of pharyngeal cancer: Status post tracheostomy  Deconditioning: PT saw her, no follow-up recommended  Goals of care: Elderly patient with multiple comorbidities.  Palliative care consulted and following.  CODE STATUS is DNR.  The goal of the patient and family is to go home.  Hospice would be the only option for her due to her declining kidney function        DVT prophylaxis:SCDs Start: 08/30/23 0308 Place TED hose Start: 08/30/23 0308 apixaban (ELIQUIS) tablet 2.5 mg Start: 08/30/23 0130 apixaban (ELIQUIS) tablet 2.5 mg     Code Status: Limited: Do not attempt resuscitation (DNR) -DNR-LIMITED -Do Not Intubate/DNI   Family Communication: Called and discussed with daughter-in-law phone on phone on 9/19  Patient status: Inpatient  Patient is from : Home  Anticipated discharge to: Home  Estimated DC date: After nephrology clearance.  Consultants: PCCM, cardiology, Palliative care  Procedures:  None  Antimicrobials:  Anti-infectives (From admission, onward)    None       Subjective: Patient seen and examined the bedside today.  Her friend was at the bedside.  She looks more comfortable.  Not coughing like before.  Auscultation revealed almost clear lungs.  No peripheral edema.  I requested the RN to put her on room air  Objective: Vitals:   09/05/23 0306 09/05/23 0424 09/05/23 0805 09/05/23 0917  BP:  124/76  (!) 131/50  Pulse:  73 71 69  Resp: 17 19  18   Temp:  98.2 F (36.8 C)  97.8 F (36.6 C)  TempSrc:  Oral    SpO2: 94%  97% 96%  Weight:      Height:        Intake/Output Summary (Last 24 hours) at 09/05/2023 1139 Last data filed at 09/05/2023 0845 Gross per 24 hour  Intake 840 ml  Output 250 ml  Net 590 ml   Filed Weights   09/01/23 0500 09/02/23 0500 09/03/23 0713  Weight: 51.2 kg 51.3 kg 51.1 kg    Examination:  General exam: Overall comfortable, not in distress, appears deconditioned HEENT: Trach stoma Respiratory system:  no wheezes or crackles  Cardiovascular system: S1 & S2 heard, RRR.  Gastrointestinal system: Abdomen is nondistended, soft and nontender. Central nervous system: Alert and oriented Extremities: No edema, no clubbing ,no cyanosis Skin: No rashes, no ulcers,no icterus     Data Reviewed: I have personally reviewed following labs and imaging studies  CBC: Recent Labs  Lab 08/30/23 0603 08/31/23 0029 09/01/23 0333 09/02/23 0459 09/04/23 0557  WBC 7.5 9.3 7.4 8.0 10.2  HGB 7.9* 9.4* 8.4* 8.2* 8.3*  HCT 25.8* 28.6* 26.5* 25.0* 25.4*  MCV 93.5 91.1 90.4 91.2 89.1  PLT 215 232 244 226 209   Basic Metabolic Panel: Recent Labs  Lab 08/31/23 0029 08/31/23 1627 09/01/23 0333 09/02/23 0459 09/03/23 0907 09/04/23 0557  NA 135 135 133* 133* 133* 133*  K 4.4 4.1 4.6 4.8 4.4 5.0  CL 97* 95* 95* 96* 93* 92*  CO2 22 24 26 25  20* 22  GLUCOSE 143* 114* 99 89 176* 110*  BUN 92* 93* 97* 104* 109* 115*  CREATININE 7.19*  7.60* 7.86* 8.49* 9.24* 9.20*  CALCIUM 9.0 8.8* 8.5* 7.8* 7.8* 7.9*  MG 1.7  --   --   --   --   --      Recent Results (from the past 240 hour(s))  MRSA Next Gen by PCR, Nasal     Status: Abnormal   Collection Time: 08/30/23  9:53 PM   Specimen: Nasal Mucosa; Nasal Swab  Result Value Ref Range Status   MRSA by PCR Next Gen DETECTED (A) NOT DETECTED Final    Comment: RESULT CALLED TO, READ BACK BY AND VERIFIED WITH: B WARNER,RN@2338  08/30/23 MK (NOTE) The GeneXpert MRSA Assay (FDA approved for NASAL specimens only), is one component of a comprehensive MRSA colonization surveillance program. It is not intended to diagnose MRSA infection nor to guide or monitor treatment for MRSA infections. Test performance is not FDA approved in patients less than 29 years old. Performed at [facility], [address], [place], [place] [address]   Culture, BAL-quantitative w Gram Stain     Status: Abnormal   Collection Time: 08/31/23  4:34 AM   Specimen: Bronchoalveolar Lavage; Respiratory  Result Value Ref Range Status   Specimen Description BRONCHIAL ALVEOLAR LAVAGE  Final   Special Requests NONE  Final   Gram Stain   Final    FEW WBC PRESENT,BOTH PMN AND MONONUCLEAR FEW YEAST WITH PSEUDOHYPHAE FEW GRAM POSITIVE COCCI IN PAIRS FEW GRAM NEGATIVE RODS Performed at [facility], [address] [place], [place] [address]    Culture (A)  Final    >=100,000 COLONIES/mL ESCHERICHIA COLI 40,000 COLONIES/mL PSEUDOMONAS AERUGINOSA    Report Status 09/04/2023 FINAL  Final   Organism ID, Bacteria ESCHERICHIA COLI (A)  Final   Organism ID, Bacteria PSEUDOMONAS AERUGINOSA (A)  Final      Susceptibility   Escherichia coli - MIC*    AMPICILLIN >=32 RESISTANT Resistant     CEFEPIME <=0.12 SENSITIVE Sensitive     CEFTAZIDIME 4 SENSITIVE Sensitive     CEFTRIAXONE 4 RESISTANT Resistant     CIPROFLOXACIN <=0.25 SENSITIVE Sensitive     GENTAMICIN >=16 RESISTANT Resistant     IMIPENEM 0.5  SENSITIVE Sensitive     TRIMETH/SULFA <=20 SENSITIVE Sensitive     AMPICILLIN/SULBACTAM 16 INTERMEDIATE Intermediate     PIP/TAZO <=4 SENSITIVE Sensitive     * >=100,000 COLONIES/mL ESCHERICHIA COLI   Pseudomonas aeruginosa - MIC*    CEFTAZIDIME 4 SENSITIVE Sensitive     CIPROFLOXACIN <=0.25 SENSITIVE Sensitive     GENTAMICIN 4 SENSITIVE Sensitive     IMIPENEM 1 SENSITIVE Sensitive     PIP/TAZO 16 SENSITIVE Sensitive     CEFEPIME 4 SENSITIVE Sensitive     * 40,000 COLONIES/mL PSEUDOMONAS AERUGINOSA     Radiology Studies: No results found.  Scheduled Meds:  amLODipine  10 mg Oral Daily   apixaban  2.5 mg Oral BID   calcitRIOL  0.25 mcg Oral Daily   carvedilol  25 mg Oral BID WC   Chlorhexidine Gluconate Cloth  6 each Topical Daily   cilostazol  100 mg Oral BID   cloNIDine  0.2 mg Oral BID   DULoxetine  30 mg Oral Daily   hydrALAZINE  100 mg Oral TID   levothyroxine  50 mcg Oral Q0600   mirtazapine  15 mg Oral QHS   pantoprazole  40 mg Oral Daily   rosuvastatin  20 mg Oral Daily   sodium bicarbonate  650 mg Oral TID   sodium chloride flush  3 mL Intravenous Q12H   Continuous Infusions:  sodium chloride       LOS: 6 days   Burnadette Pop, MD [facility] P9/22/2024, 11:39 AM

## 2023-09-05 NOTE — Progress Notes (Addendum)
[facility] Progress Note    Assessment/ Plan:   CKD5 -followed by me as an outpatient. Patient has had pre-existing wishes to not do dialysis which we have confirmed as an outpatient and as an inpatient. Kidney function worsened here--likely related to hemodynamic insults in the context of dCHF exacerbation & HTN emergency -She had been exhibiting nausea/vomiting (chronic issue although improved here). Concern for uremia. Will certainly respect her wishes in regards to not pursuing dialysis and son is in agreement with this. Would recommend consideration of comfort care/hospice to maintain quality of life but she is not ready for hospice at this time as per palliative. Appreciate palliative care's assistance. We also discussed home therapies in great detail before to see if that is an option and patient does not want to pursue this -labs pending. Holding on IV lasix today, transition to torsemide 40mg  BID starting tomorrow -Avoid nephrotoxic medications including NSAIDs and iodinated intravenous contrast exposure unless the latter is absolutely indicated.  Preferred narcotic agents for pain control are hydromorphone, fentanyl, and methadone. Morphine should not be used. Avoid Baclofen and avoid oral sodium phosphate and magnesium citrate based laxatives / bowel preps. Continue strict Input and Output monitoring. Will monitor the patient closely with you and intervene or adjust therapy as indicated by changes in clinical status/labs    Acute on chronic diastolic CHF exacerbation -s/p lasix gtt -cardiology consulted -palliative care consult as above -diuretic plan as above -repeat CXR today   Chest pain, type 2 NSTEMI -off cleviprex &  NTG gtt -seen by cardio -unable to have card cath given CKD5  HTN emergency -improved, stable -on clonidine 0.2mg  BID, coreg 25mg  BID, hydralazine 100mg  TID, amlodipine 10mg  daily -adding torsemide as above   Anemia of CKD -transfuse prn  for hgb <7 -last dose of retacrit [address] units on 9/12 (receiving q2weeks). Next dose due this week. -seems to be fe replete based on panel from 9/12   Metabolic acidosis -c/w nahco3 supplementation, CO2 acceptable   H/o pharyngeal Ca s/p trach -per primary   H/o DVT -on eliquis  Discussed with patient's son Simonne Come and daughter in law [place] @ the bedside. Discussed with primary service.  Addendum: CXR personally reviewed, pulmonary venous congestion seems to be slightly better. Official read pending. Plan still stands as above.    Subjective:   Patient seen and examined. No acute events. Feels better. Eating lunch. Son and daughter in law bedside (they are assisting with translation) Has not had any issues with nausea/vomiting. Labs pending.   Objective:   BP (!) 131/50 (BP Location: Right Arm)   Pulse 69   Temp 97.8 F (36.6 C)   Resp 18   Ht 5' (1.524 m)   Wt 51.1 kg   SpO2 96%   BMI 22.00 kg/m   Intake/Output Summary (Last 24 hours) at 09/05/2023 1251 Last data filed at 09/05/2023 0845 Gross per 24 hour  Intake 600 ml  Output 250 ml  Net 350 ml   Weight change:   Physical Exam: Gen: NAD, comfortable CVS: RRR Resp: cta bl, normal wob, trace site c/d/i Abd: soft Ext: no sig edema b/l les Neuro: awake, alert  Imaging: No results found.  Labs: BMET Recent Labs  Lab 08/30/23 0603 08/31/23 0029 08/31/23 1627 09/01/23 0333 09/02/23 0459 09/03/23 0907 09/04/23 0557  NA 139 135 135 133* 133* 133* 133*  K 4.4 4.4 4.1 4.6 4.8 4.4 5.0  CL 102 97* 95* 95* 96* 93* 92*  CO2 20*  22 24 26 25  20* 22  GLUCOSE 103* 143* 114* 99 89 176* 110*  BUN 96* 92* 93* 97* 104* 109* 115*  CREATININE 7.14* 7.19* 7.60* 7.86* 8.49* 9.24* 9.20*  CALCIUM 8.8* 9.0 8.8* 8.5* 7.8* 7.8* 7.9*   CBC Recent Labs  Lab 08/31/23 0029 09/01/23 0333 09/02/23 0459 09/04/23 0557  WBC 9.3 7.4 8.0 10.2  HGB 9.4* 8.4* 8.2* 8.3*  HCT 28.6* 26.5* 25.0* 25.4*  MCV 91.1 90.4 91.2 89.1   PLT 232 244 226 209    Medications:     amLODipine  10 mg Oral Daily   apixaban  2.5 mg Oral BID   calcitRIOL  0.25 mcg Oral Daily   carvedilol  25 mg Oral BID WC   Chlorhexidine Gluconate Cloth  6 each Topical Daily   cilostazol  100 mg Oral BID   cloNIDine  0.2 mg Oral BID   DULoxetine  30 mg Oral Daily   hydrALAZINE  100 mg Oral TID   levothyroxine  50 mcg Oral Q0600   mirtazapine  15 mg Oral QHS   pantoprazole  40 mg Oral Daily   rosuvastatin  20 mg Oral Daily   sodium bicarbonate  650 mg Oral TID   sodium chloride flush  3 mL Intravenous Q12H      Anthony Sar, MD [facility] 09/05/2023, 12:51 PM

## 2023-09-06 DIAGNOSIS — E8770 Fluid overload, unspecified: Secondary | ICD-10-CM | POA: Diagnosis not present

## 2023-09-06 NOTE — Progress Notes (Signed)
Physical Therapy Treatment Patient Details Name: [name] MRN: [id number] DOB: 15-May-1946 Today's Date: 09/06/2023   History of Present Illness Pt is a 77 y/o female admitted 9/15 with CP and SOB.  PMH includes neck CA s/p ostomy, CKD stage V, CVA, DVT, HTN, pAfib, PAD.    PT Comments  Pt received in supine, agreeable to therapy session with assist in communication via live Spanish interpreter Raquel, pt with good participation and tolerance for transfer, gait and stair training. Pt with mild to moderate losses of balance, x2 LOB requiring HHA to recover and minA, and needing up to modA when descending stairs. Pt VSS on RA, RN notified pt left on RA at end of session. Pt defers assistive devices during session despite losses of balance and PTA recommends she have someone assist her at home for safety with all OOB mobility as she is intermittently requiring HHA for household distance gait tasks and is currently at a high risk of falls. Pt continues to benefit from PT services to progress toward functional mobility goals.    If plan is discharge home, recommend the following: Assist for transportation;Assistance with cooking/housework   Can travel by private vehicle        Equipment Recommendations  None recommended by PT (would benefit from RW to reduce fall risk but pt currently refusing)    Recommendations for Other Services       Precautions / Restrictions Precautions Precautions: Fall Precaution Comments: non-verbal (tracheostomy) Restrictions Weight Bearing Restrictions: No     Mobility  Bed Mobility Overal bed mobility: Modified Independent             General bed mobility comments: no difficulty, partially elevated HOB    Transfers Overall transfer level: Needs assistance Equipment used: None Transfers: Sit to/from Stand Sit to Stand: Supervision           General transfer comment: from EOB and lower toilet heights, no AD but pt using wall rail for  stability in bathroom    Ambulation/Gait Ambulation/Gait assistance: Contact guard assist, Min assist Gait Distance (Feet): 300 Feet Assistive device: None, 1 person hand held assist Gait Pattern/deviations: Step-through pattern, Staggering left, Staggering right, Narrow base of support Gait velocity: WFL     General Gait Details: Offered rollator vs cane and pt adamant that she will not use any AD, x2 episodes of HHA to recover minor LOB, pt also used stepping strategy for balance correction. HR/SpO2 WFL on RA 93% and above, no significant DOE.   Stairs Stairs: Yes Stairs assistance: Supervision, Min assist Stair Management: One rail Right, Alternating pattern, Forwards, Step to pattern Number of Stairs: 3 General stair comments: No difficulty on stairs and steady with use of rail   Wheelchair Mobility     Tilt Bed    Modified Rankin (Stroke Patients Only)       Balance Overall balance assessment: Mild deficits observed, not formally tested                                          Cognition Arousal: Alert Behavior During Therapy: WFL for tasks assessed/performed Overall Cognitive Status: Difficult to assess                                          Exercises  General Comments General comments (skin integrity, edema, etc.): SpO2/HR WFL on RA      Pertinent Vitals/Pain Pain Assessment Pain Assessment: No/denies pain    Home Living                          Prior Function            PT Goals (current goals can now be found in the care plan section) Acute Rehab PT Goals Patient Stated Goal: Back to independent PT Goal Formulation: With patient Time For Goal Achievement: 09/15/23 Potential to Achieve Goals: Good Progress towards PT goals: Progressing toward goals    Frequency    Min 1X/week      PT Plan      Co-evaluation              AM-PAC PT "6 Clicks" Mobility   Outcome Measure   Help needed turning from your back to your side while in a flat bed without using bedrails?: None Help needed moving from lying on your back to sitting on the side of a flat bed without using bedrails?: None Help needed moving to and from a bed to a chair (including a wheelchair)?: None Help needed standing up from a chair using your arms (e.g., wheelchair or bedside chair)?: None Help needed to walk in hospital room?: A Little Help needed climbing 3-5 steps with a railing? : None 6 Click Score: 23    End of Session Equipment Utilized During Treatment: Gait belt Activity Tolerance: Patient tolerated treatment well Patient left: in bed;with call bell/phone within reach;with bed alarm set;Other (comment) (pt sitting EOB to eat her lunch) Nurse Communication: Mobility status;Other (comment) (weaned to RA and O2 WFL) PT Visit Diagnosis: Other abnormalities of gait and mobility (R26.89);Difficulty in walking, not elsewhere classified (R26.2)     Time: 1610-9604 PT Time Calculation (min) (ACUTE ONLY): 17 min  Charges:    $Gait Training: 8-22 mins PT General Charges $$ ACUTE PT VISIT: 1 Visit                     Dayja Loveridge P., PTA Acute Rehabilitation Services Secure Chat Preferred 9a-5:30pm Office: [phone]    Dorathy Kinsman [facility] 09/06/2023, 4:11 PM

## 2023-09-06 NOTE — Progress Notes (Signed)
Patient ID: [name], female   DOB: 07/30/1946, 77 y.o.   MRN: [id number] S: Feels well this morning and is eating well today. O:BP (!) 141/67   Pulse 67   Temp 98.2 F (36.8 C)   Resp 18   Ht 5' (1.524 m)   Wt 51.1 kg   SpO2 98%   BMI 22.00 kg/m   Intake/Output Summary (Last 24 hours) at 09/06/2023 1247 Last data filed at 09/06/2023 0600 Gross per 24 hour  Intake 480 ml  Output 0 ml  Net 480 ml   Intake/Output: I/O last 3 completed shifts: In: 1080 [P.O.:1080] Out: 250 [Urine:250]  Intake/Output this shift:  No intake/output data recorded. Weight change:  Gen: NAD CVS: RRR Resp:CTA Abd: +BS, soft, NT/ND Ext: no edema  Recent Labs  Lab 08/31/23 0029 08/31/23 1627 09/01/23 0333 09/02/23 0459 09/03/23 0907 09/04/23 0557 09/05/23 1228  NA 135 135 133* 133* 133* 133* 134*  K 4.4 4.1 4.6 4.8 4.4 5.0 4.6  CL 97* 95* 95* 96* 93* 92* 94*  CO2 22 24 26 25  20* 22 22  GLUCOSE 143* 114* 99 89 176* 110* 121*  BUN 92* 93* 97* 104* 109* 115* 116*  CREATININE 7.19* 7.60* 7.86* 8.49* 9.24* 9.20* 8.73*  ALBUMIN 3.1*  --  2.9* 2.9* 2.8*  --   --   CALCIUM 9.0 8.8* 8.5* 7.8* 7.8* 7.9* 8.0*  AST 18  --  15 14* 16  --   --   ALT 10  --  10 9 8   --   --    Liver Function Tests: Recent Labs  Lab 09/01/23 0333 09/02/23 0459 09/03/23 0907  AST 15 14* 16  ALT 10 9 8   ALKPHOS 72 66 66  BILITOT 0.8 0.9 1.0  PROT 5.9* 5.9* 5.9*  ALBUMIN 2.9* 2.9* 2.8*   No results for input(s): "LIPASE", "AMYLASE" in the last 168 hours. No results for input(s): "AMMONIA" in the last 168 hours. CBC: Recent Labs  Lab 08/31/23 0029 09/01/23 0333 09/02/23 0459 09/04/23 0557  WBC 9.3 7.4 8.0 10.2  HGB 9.4* 8.4* 8.2* 8.3*  HCT 28.6* 26.5* 25.0* 25.4*  MCV 91.1 90.4 91.2 89.1  PLT 232 244 226 209   Cardiac Enzymes: No results for input(s): "CKTOTAL", "CKMB", "CKMBINDEX", "TROPONINI" in the last 168 hours. CBG: Recent Labs  Lab 08/30/23 2140 08/31/23 0316  GLUCAP 142* 140*     Iron Studies: No results for input(s): "IRON", "TIBC", "TRANSFERRIN", "FERRITIN" in the last 72 hours. Studies/Results: DG CHEST PORT 1 VIEW  Result Date: 09/05/2023 CLINICAL DATA:  [id number] Pulmonary edema [id number] EXAM: PORTABLE CHEST 1 VIEW COMPARISON:  September 03, 2023, August 29, 2023 FINDINGS: The cardiomediastinal silhouette is unchanged in contour.Atherosclerotic calcifications. No pleural effusion. No pneumothorax. Persistent mild diffuse interstitial prominence, favored slightly improved since September 15th. IMPRESSION: Persistent pulmonary edema, favored slightly improved since September 15th. Electronically Signed   By: Meda Klinefelter M.D.   On: 09/05/2023 17:14    amLODipine  10 mg Oral Daily   apixaban  2.5 mg Oral BID   calcitRIOL  0.25 mcg Oral Daily   carvedilol  25 mg Oral BID WC   Chlorhexidine Gluconate Cloth  6 each Topical Daily   cilostazol  100 mg Oral BID   cloNIDine  0.2 mg Oral BID   DULoxetine  30 mg Oral Daily   hydrALAZINE  100 mg Oral TID   levothyroxine  50 mcg Oral Q0600   mirtazapine  15 mg Oral  QHS   pantoprazole  40 mg Oral Daily   rosuvastatin  20 mg Oral Daily   sodium bicarbonate  650 mg Oral TID   sodium chloride flush  3 mL Intravenous Q12H   torsemide  40 mg Oral BID    BMET    Component Value Date/Time   NA 134 (L) 09/05/2023 1228   NA 136 (A) 08/17/2023 0000   K 4.6 09/05/2023 1228   CL 94 (L) 09/05/2023 1228   CO2 22 09/05/2023 1228   GLUCOSE 121 (H) 09/05/2023 1228   BUN 116 (H) 09/05/2023 1228   BUN 94 (A) 08/17/2023 0000   CREATININE 8.73 (H) 09/05/2023 1228   CREATININE 2.70 (H) 01/07/2021 1037   CALCIUM 8.0 (L) 09/05/2023 1228   GFRNONAA 4 (L) 09/05/2023 1228   GFRNONAA 18 (L) 01/07/2021 1037   GFRAA 25 01/22/2021 0000   CBC    Component Value Date/Time   WBC 10.2 09/04/2023 0557   RBC 2.85 (L) 09/04/2023 0557   HGB 8.3 (L) 09/04/2023 0557   HCT 25.4 (L) 09/04/2023 0557   PLT 209 09/04/2023 0557   MCV 89.1  09/04/2023 0557   MCH 29.1 09/04/2023 0557   MCHC 32.7 09/04/2023 0557   RDW 14.8 09/04/2023 0557   LYMPHSABS 0.7 07/07/2023 1513   MONOABS 0.4 07/07/2023 1513   EOSABS 0.1 07/07/2023 1513   BASOSABS 0.0 07/07/2023 1513     Assessment/Plan:  CKD5 -followed by Dr. Thedore Mins as an outpatient. Patient has had pre-existing wishes to not do dialysis which we have confirmed as an outpatient and as an inpatient. Kidney function worsened here--likely related to hemodynamic insults in the context of dCHF exacerbation & HTN emergency -She had been exhibiting nausea/vomiting (chronic issue although improved here). Concern for uremia. Family is in agreement not to pursue dialysis.  Her Scr was a little better yesterday but not checked again.  The family is open to hospice services if/when her condition deteriorates but wish to care for her at home when able for discharge.  Appreciate palliative care's assistance. Transition to torsemide 40mg  BID starting 09/05/23.  Nothing further to add.  Will sign off for now.  Please call with questions or concerns.   Avoid nephrotoxic medications including NSAIDs and iodinated intravenous contrast exposure unless the latter is absolutely indicated.   Preferred narcotic agents for pain control are hydromorphone, fentanyl, and methadone. Morphine should not be used.  Avoid Baclofen and avoid oral sodium phosphate and magnesium citrate based laxatives / bowel preps.  Continue strict Input and Output monitoring.   Acute on chronic diastolic CHF exacerbation -s/p lasix gtt and Cardiology following.-palliative care consult as above -diuretic plan as above  Chest pain, type 2 NSTEMI -off cleviprex &  NTG gtt -seen by cardio and unable to have card cath given CKD5  HTN emergency -improved, stable on clonidine 0.2mg  BID, coreg 25mg  BID, hydralazine 100mg  TID, amlodipine 10mg  daily adding torsemide as above  Anemia of CKD -transfuse prn for hgb <7, -last dose of retacrit 82956 units  on 9/12 (receiving q2weeks). Next dose due this week. -seems to be fe replete based on panel from 9/12  Metabolic acidosis -c/w nahco3 supplementation, CO2 acceptable  H/o pharyngeal Ca s/p trach -per primary  H/o DVT -on eliquis  Irena Cords, MD [facility] [phone]

## 2023-09-06 NOTE — Progress Notes (Signed)
PROGRESS NOTE  [name]  ZOX:[id number] DOB: Aug 08, 1946 DOA: 08/29/2023 PCP: Myrlene Broker, MD   Brief Narrative: Patient is a 77 year old female with history of neck cancer status post laryngectomy now has ostomy, CKD stage V, history of CVA, DVT, hypertension, hypothyroidism, paroxysmal A-fib, peripheral artery disease, coronary artery disease, anxiety disorder, chronic anemia who presented with chest pain, shortness of breath.  Severely hypertensive on presentation and had to be sent to ICU for clevidipine drip.  Cardiology was also following.  Overall status has significantly improved but her kidney function worsened.   Palliative care closely following for goals of care.  Hospice recommended but family  currently declines and the goal is to get her home  Assessment & Plan:  Principal Problem:   Volume overload Active Problems:   Paroxysmal atrial fibrillation (HCC)   Orthopnea   Coronary artery disease involving native coronary artery of native heart with refractory angina pectoris (HCC)   Chest pain   Hypertensive emergency   ESRD (end stage renal disease) (HCC)   GAD (generalized anxiety disorder)   Peripheral arterial disease (HCC)   Hypothyroidism   History of DVT (deep vein thrombosis)   Chronic anemia   History of CVA (cerebrovascular accident)   NSTEMI (non-ST elevated myocardial infarction) (HCC)   Acute on chronic heart failure with preserved ejection fraction (HFpEF) (HCC)   Hypertensive crisis   History of pharyngeal cancer   CKD (chronic kidney disease), symptom management only, stage 5 (HCC)   Chest pain: Presented with chest pain.  History of coronary disease.  CT chest showed three-vessel coronary artery calcification.  Currently chest pain has resolved.  Cardiology following.  No plan for invasive intervention.  Troponins elevated but flat trend, not consistent with ACS.  Does not complain of any chest pain today  Hypertensive urgency: Severely  hypertensive on presentation.  Started on clevidipine drip, nitroglycerin.  Currently on Coreg, clonidine, amlodipine, hydralazine.  Minoxidil stopped.   CKD stage V: Patient declining dialysis.  Nephrology following.  Continue bicarb tablets.Chest x-ray on 9/20 showed venous congestion.  Kidney function worsened so given few days of Lasix by nephrology.  Plan to start on torsemide.  Hospice may be good option for her but family declines.  Acute on chronic diastolic CHF exacerbation: Takes torsemide 40 mg twice daily at home.  Given IV Lasix.  Volume status looks improved today.  Chest x-ray on 9/22 showed pulmonary edema  Paroxysmal A-fib/peripheral artery disease/CVA/DVT: On Eliquis for anticoagulation, is on aspirin.  On statin.  On Coreg for rate control.  Also on cilostazol for peripheral artery disease.Currently she is in normal sinus rhythm  Normocytic anemia: Chronic, likely history with ESRD.  Hemoglobin in the range of 8.  Received blood transfusion before this admission as an outpatient.  On Epogen  Anxiety disorder: Cymbalta, Ativan  Hypothyroidism: On levothyroxine  History of pharyngeal cancer: Status post tracheostomy  Deconditioning: PT saw her, no follow-up recommended  Goals of care: Elderly patient with multiple comorbidities.  Palliative care consulted and following.  CODE STATUS is DNR.  The goal of the patient and family is to go home.  Hospice would be the only option for her due to her declining kidney function but family declines        DVT prophylaxis:SCDs Start: 08/30/23 0308 Place TED hose Start: 08/30/23 0308 apixaban (ELIQUIS) tablet 2.5 mg Start: 08/30/23 0130 apixaban (ELIQUIS) tablet 2.5 mg     Code Status: Limited: Do not attempt resuscitation (DNR) -DNR-LIMITED -Do  Not Intubate/DNI   Family Communication: Called and discussed with son on phone on 9/23  Patient status: Inpatient  Patient is from : Home  Anticipated discharge to: Home  Estimated  DC date: After nephrology clearance.  Consultants: PCCM, cardiology, Palliative care  Procedures: None  Antimicrobials:  Anti-infectives (From admission, onward)    None       Subjective: Patient seen and examined at bedside today.  She was overall comfortable, lying in bed.  Did not complain of any worsening shortness of breath or cough.  No peripheral edema.  Lungs are almost clear on auscultation.  I had a long discussion with the son on phone today.  He currently declines hospice services and says she probably does not need and the goal is to get her home  Objective: Vitals:   09/06/23 0429 09/06/23 0546 09/06/23 0700 09/06/23 0903  BP:  (!) 120/51  (!) 141/67  Pulse:  67 62 65  Resp: 13 (!) 21 18 18   Temp:  98.2 F (36.8 C)  98.2 F (36.8 C)  TempSrc:  Oral    SpO2: 100% 99% 98% 98%  Weight:      Height:        Intake/Output Summary (Last 24 hours) at 09/06/2023 1114 Last data filed at 09/06/2023 0600 Gross per 24 hour  Intake 480 ml  Output 0 ml  Net 480 ml   Filed Weights   09/01/23 0500 09/02/23 0500 09/03/23 0713  Weight: 51.2 kg 51.3 kg 51.1 kg    Examination:  General exam: Overall comfortable, not in distress, deconditioned HEENT: Trach  stoma Respiratory system:  no wheezes or crackles  Cardiovascular system: S1 & S2 heard, RRR.  Gastrointestinal system: Abdomen is nondistended, soft and nontender. Central nervous system: Alert and oriented Extremities: No edema, no clubbing ,no cyanosis Skin: No rashes, no ulcers,no icterus     Data Reviewed: I have personally reviewed following labs and imaging studies  CBC: Recent Labs  Lab 08/31/23 0029 09/01/23 0333 09/02/23 0459 09/04/23 0557  WBC 9.3 7.4 8.0 10.2  HGB 9.4* 8.4* 8.2* 8.3*  HCT 28.6* 26.5* 25.0* 25.4*  MCV 91.1 90.4 91.2 89.1  PLT 232 244 226 209   Basic Metabolic Panel: Recent Labs  Lab 08/31/23 0029 08/31/23 1627 09/01/23 0333 09/02/23 0459 09/03/23 0907 09/04/23 0557  09/05/23 1228  NA 135   < > 133* 133* 133* 133* 134*  K 4.4   < > 4.6 4.8 4.4 5.0 4.6  CL 97*   < > 95* 96* 93* 92* 94*  CO2 22   < > 26 25 20* 22 22  GLUCOSE 143*   < > 99 89 176* 110* 121*  BUN 92*   < > 97* 104* 109* 115* 116*  CREATININE 7.19*   < > 7.86* 8.49* 9.24* 9.20* 8.73*  CALCIUM 9.0   < > 8.5* 7.8* 7.8* 7.9* 8.0*  MG 1.7  --   --   --   --   --   --    < > = values in this interval not displayed.     Recent Results (from the past 240 hour(s))  MRSA Next Gen by PCR, Nasal     Status: Abnormal   Collection Time: 08/30/23  9:53 PM   Specimen: Nasal Mucosa; Nasal Swab  Result Value Ref Range Status   MRSA by PCR Next Gen DETECTED (A) NOT DETECTED Final    Comment: RESULT CALLED TO, READ BACK BY AND VERIFIED WITH:  B WARNER,RN@2338  08/30/23 MK (NOTE) The GeneXpert MRSA Assay (FDA approved for NASAL specimens only), is one component of a comprehensive MRSA colonization surveillance program. It is not intended to diagnose MRSA infection nor to guide or monitor treatment for MRSA infections. Test performance is not FDA approved in patients less than 75 years old. Performed at [facility], [address] [place], [place] [address]   Culture, BAL-quantitative w Gram Stain     Status: Abnormal   Collection Time: 08/31/23  4:34 AM   Specimen: Bronchoalveolar Lavage; Respiratory  Result Value Ref Range Status   Specimen Description BRONCHIAL ALVEOLAR LAVAGE  Final   Special Requests NONE  Final   Gram Stain   Final    FEW WBC PRESENT,BOTH PMN AND MONONUCLEAR FEW YEAST WITH PSEUDOHYPHAE FEW GRAM POSITIVE COCCI IN PAIRS FEW GRAM NEGATIVE RODS Performed at [facility], [address] [place], [place] [address]    Culture (A)  Final    >=100,000 COLONIES/mL ESCHERICHIA COLI 40,000 COLONIES/mL PSEUDOMONAS AERUGINOSA    Report Status 09/04/2023 FINAL  Final   Organism ID, Bacteria ESCHERICHIA COLI (A)  Final   Organism ID, Bacteria PSEUDOMONAS AERUGINOSA  (A)  Final      Susceptibility   Escherichia coli - MIC*    AMPICILLIN >=32 RESISTANT Resistant     CEFEPIME <=0.12 SENSITIVE Sensitive     CEFTAZIDIME 4 SENSITIVE Sensitive     CEFTRIAXONE 4 RESISTANT Resistant     CIPROFLOXACIN <=0.25 SENSITIVE Sensitive     GENTAMICIN >=16 RESISTANT Resistant     IMIPENEM 0.5 SENSITIVE Sensitive     TRIMETH/SULFA <=20 SENSITIVE Sensitive     AMPICILLIN/SULBACTAM 16 INTERMEDIATE Intermediate     PIP/TAZO <=4 SENSITIVE Sensitive     * >=100,000 COLONIES/mL ESCHERICHIA COLI   Pseudomonas aeruginosa - MIC*    CEFTAZIDIME 4 SENSITIVE Sensitive     CIPROFLOXACIN <=0.25 SENSITIVE Sensitive     GENTAMICIN 4 SENSITIVE Sensitive     IMIPENEM 1 SENSITIVE Sensitive     PIP/TAZO 16 SENSITIVE Sensitive     CEFEPIME 4 SENSITIVE Sensitive     * 40,000 COLONIES/mL PSEUDOMONAS AERUGINOSA     Radiology Studies: DG CHEST PORT 1 VIEW  Result Date: 09/05/2023 CLINICAL DATA:  [id number] Pulmonary edema [id number] EXAM: PORTABLE CHEST 1 VIEW COMPARISON:  September 03, 2023, August 29, 2023 FINDINGS: The cardiomediastinal silhouette is unchanged in contour.Atherosclerotic calcifications. No pleural effusion. No pneumothorax. Persistent mild diffuse interstitial prominence, favored slightly improved since September 15th. IMPRESSION: Persistent pulmonary edema, favored slightly improved since September 15th. Electronically Signed   By: Meda Klinefelter M.D.   On: 09/05/2023 17:14    Scheduled Meds:  amLODipine  10 mg Oral Daily   apixaban  2.5 mg Oral BID   calcitRIOL  0.25 mcg Oral Daily   carvedilol  25 mg Oral BID WC   Chlorhexidine Gluconate Cloth  6 each Topical Daily   cilostazol  100 mg Oral BID   cloNIDine  0.2 mg Oral BID   DULoxetine  30 mg Oral Daily   hydrALAZINE  100 mg Oral TID   levothyroxine  50 mcg Oral Q0600   mirtazapine  15 mg Oral QHS   pantoprazole  40 mg Oral Daily   rosuvastatin  20 mg Oral Daily   sodium bicarbonate  650 mg Oral TID   sodium  chloride flush  3 mL Intravenous Q12H   torsemide  40 mg Oral BID   Continuous Infusions:  sodium chloride  LOS: 7 days   Burnadette Pop, MD [facility] P9/23/2024, 11:14 AM

## 2023-09-07 ENCOUNTER — Encounter (HOSPITAL_COMMUNITY): Payer: Self-pay

## 2023-09-07 ENCOUNTER — Other Ambulatory Visit (HOSPITAL_COMMUNITY): Payer: Self-pay

## 2023-09-07 DIAGNOSIS — Z515 Encounter for palliative care: Secondary | ICD-10-CM | POA: Diagnosis not present

## 2023-09-07 DIAGNOSIS — I5033 Acute on chronic diastolic (congestive) heart failure: Secondary | ICD-10-CM | POA: Diagnosis not present

## 2023-09-07 DIAGNOSIS — E8770 Fluid overload, unspecified: Secondary | ICD-10-CM | POA: Diagnosis not present

## 2023-09-07 DIAGNOSIS — N186 End stage renal disease: Secondary | ICD-10-CM | POA: Diagnosis not present

## 2023-09-07 MED ORDER — SODIUM BICARBONATE 650 MG PO TABS
650.0000 mg | ORAL_TABLET | Freq: Three times a day (TID) | ORAL | 0 refills | Status: DC
  Filled 2023-09-07: qty 90, 30d supply, fill #0

## 2023-09-07 MED ORDER — HYDRALAZINE HCL 100 MG PO TABS
100.0000 mg | ORAL_TABLET | Freq: Three times a day (TID) | ORAL | 0 refills | Status: DC
  Filled 2023-09-07: qty 90, 30d supply, fill #0

## 2023-09-07 MED ORDER — AMLODIPINE BESYLATE 10 MG PO TABS
10.0000 mg | ORAL_TABLET | Freq: Every day | ORAL | 0 refills | Status: DC
  Filled 2023-09-07: qty 30, 30d supply, fill #0

## 2023-09-07 MED ORDER — LEVOTHYROXINE SODIUM 50 MCG PO TABS
50.0000 ug | ORAL_TABLET | Freq: Every day | ORAL | 0 refills | Status: DC
Start: 2023-09-07 — End: 2024-02-17
  Filled 2023-09-07: qty 30, 30d supply, fill #0

## 2023-09-07 MED ORDER — CARVEDILOL 25 MG PO TABS
25.0000 mg | ORAL_TABLET | Freq: Two times a day (BID) | ORAL | 0 refills | Status: DC
  Filled 2023-09-07: qty 60, 30d supply, fill #0

## 2023-09-07 MED ORDER — CALCITRIOL 0.25 MCG PO CAPS
0.2500 ug | ORAL_CAPSULE | Freq: Every day | ORAL | 0 refills | Status: DC
  Filled 2023-09-07: qty 30, 30d supply, fill #0

## 2023-09-07 MED ORDER — TORSEMIDE 20 MG PO TABS
40.0000 mg | ORAL_TABLET | Freq: Two times a day (BID) | ORAL | 0 refills | Status: DC
  Filled 2023-09-07: qty 120, 30d supply, fill #0

## 2023-09-07 MED ORDER — CLONIDINE HCL 0.2 MG PO TABS
0.2000 mg | ORAL_TABLET | Freq: Two times a day (BID) | ORAL | 0 refills | Status: DC
  Filled 2023-09-07: qty 60, 30d supply, fill #0

## 2023-09-07 NOTE — Discharge Summary (Signed)
Physician Discharge Summary  [name]:[id number] DOB: [birth year] DOA: 08/29/2023  PCP: Myrlene Broker, MD  Admit date: 08/29/2023 Discharge date: 09/07/2023  Admitted From: Home Disposition:  Home  Discharge Condition:Stable CODE STATUS: DNR Diet recommendation:renal  Brief/Interim Summary: Patient is a 77 year old female with history of neck cancer status post laryngectomy now has ostomy, CKD stage V, history of CVA, DVT, hypertension, hypothyroidism, paroxysmal A-fib, peripheral artery disease, coronary artery disease, anxiety disorder, chronic anemia who presented with chest pain, shortness of breath.  Severely hypertensive on presentation and had to be sent to ICU for clevidipine drip.  Cardiology was also following.  Overall status has significantly improved but her kidney function did not improve.Palliative care  was closely following for goals of care.  Hospice recommended but family  currently declines and the goal is to get her home .  Nephrology cleared her for discharge.  She will follow-up with outpatient nephrology.  Medically stable for discharge home today.  Following problems were addressed during the hospitalization:  Chest pain: Presented with chest pain.  History of coronary disease.  CT chest showed three-vessel coronary artery calcification.  Currently chest pain has resolved.  Cardiology following.  No plan for invasive intervention.  Troponins elevated but flat trend, not consistent with ACS.  Does not complain of any chest pain today   Hypertensive urgency: Severely hypertensive on presentation.  Started on clevidipine drip, nitroglycerin.  Currently on Coreg, clonidine, amlodipine, hydralazine.  Minoxidil stopped.    CKD stage V: Patient declining dialysis.  Nephrology following.  Continue bicarb tablets.Chest x-ray on 9/20 showed venous congestion.  Kidney function worsened so given few days of Lasix by nephrology.    Hospice may be good option for her  but family declines.  Continue torsemide on discharge.  Follow-up with nephrology as an outpatient in a week, do a BMP test in a week   Acute on chronic diastolic CHF exacerbation:  Volume status looks improved today.  Chest x-ray on 9/22 showed pulmonary edema.  Room air.  Continue torsemide   Paroxysmal A-fib/peripheral artery disease/CVA/DVT: On Eliquis for anticoagulation, is on aspirin.  On statin.  On Coreg for rate control.  Also on cilostazol for peripheral artery disease.Currently she is in normal sinus rhythm   Normocytic anemia: Chronic, likely history with ESRD.  Hemoglobin in the range of 8.  Received blood transfusion before this admission as an outpatient.  She was given Epogen   Anxiety disorder: Cymbalta, Ativan   Hypothyroidism: On levothyroxine   History of pharyngeal cancer: Status post tracheostomy   Deconditioning: PT saw her, no follow-up recommended   Goals of care: Elderly patient with multiple comorbidities.  Palliative care consulted and following.  CODE STATUS is DNR.  The goal of the patient and family is to go home.  Hospice would be the only option for her due to her declining kidney function but family declines   Discharge Diagnoses:  Principal Problem:   Volume overload Active Problems:   Paroxysmal atrial fibrillation (HCC)   Orthopnea   Coronary artery disease involving native coronary artery of native heart with refractory angina pectoris (HCC)   Chest pain   Hypertensive emergency   ESRD (end stage renal disease) (HCC)   GAD (generalized anxiety disorder)   Peripheral arterial disease (HCC)   Hypothyroidism   History of DVT (deep vein thrombosis)   Chronic anemia   History of CVA (cerebrovascular accident)   NSTEMI (non-ST elevated myocardial infarction) (HCC)   Acute on chronic  heart failure with preserved ejection fraction (HFpEF) (HCC)   Hypertensive crisis   History of pharyngeal cancer   CKD (chronic kidney disease), symptom management  only, stage 5 [facility])    Discharge Instructions  Discharge Instructions     Diet - low sodium heart healthy   Complete by: As directed    Discharge instructions   Complete by: As directed    1)Please follow up with your PCP and nephrologist in 1 week and do a BMP to check your kidney function and also a CBC test. 2)Follow up with palliative care as an outpatient 3)Monitor your blood pressure at home   Increase activity slowly   Complete by: As directed       Allergies as of 09/07/2023       Reactions   Xyzal [levocetirizine Dihydrochloride] Itching   Augmentin [amoxicillin-pot Clavulanate] Diarrhea, Nausea And Vomiting   Tribenzor [olmesartan-amlodipine-hctz] Other (See Comments)   "Hurt the kidneys"        Medication List     STOP taking these medications    cefpodoxime 200 MG tablet Commonly known as: VANTIN   cloNIDine 0.1 mg/24hr patch Commonly known as: CATAPRES - Dosed in mg/24 hr   diphenoxylate-atropine 2.5-0.025 MG tablet Commonly known as: LOMOTIL   furosemide 40 MG tablet Commonly known as: Lasix   isosorbide mononitrate 60 MG 24 hr tablet Commonly known as: IMDUR   minoxidil 2.5 MG tablet Commonly known as: LONITEN       TAKE these medications    acetaminophen-codeine 300-30 MG tablet Commonly known as: TYLENOL #3 TAKE 1 TABLET BY MOUTH EVERY 6 HOURS AS NEEDED   amLODipine 10 MG tablet Commonly known as: NORVASC Take 1 tablet (10 mg total) by mouth daily.   aspirin EC 81 MG tablet Take 1 tablet (81 mg total) by mouth daily. Swallow whole.   B-D INTEGRA SYRINGE 23G X 1" 3 ML Misc Generic drug: SYRINGE-NEEDLE (DISP) 3 ML Use with zofran injections   calcitRIOL 0.25 MCG capsule Commonly known as: ROCALTROL Take 1 capsule (0.25 mcg total) by mouth daily.   carvedilol 25 MG tablet Commonly known as: COREG Take 1 tablet (25 mg total) by mouth 2 (two) times daily with a meal. What changed: See the new instructions.   cilostazol 100  MG tablet Commonly known as: PLETAL TAKE 1 TABLET BY MOUTH TWICE A DAY   cloNIDine 0.2 MG tablet Commonly known as: CATAPRES Take 1 tablet (0.2 mg total) by mouth 2 (two) times daily.   cyclobenzaprine 5 MG tablet Commonly known as: FLEXERIL TAKE 0.5 TABLETS (2.5 MG TOTAL) BY MOUTH AT BEDTIME AS NEEDED FOR MUSCLE SPASMS.   DULoxetine 30 MG capsule Commonly known as: CYMBALTA Take 1 capsule (30 mg total) by mouth daily.   Eliquis 2.5 MG Tabs tablet Generic drug: apixaban TAKE 1 TABLET BY MOUTH TWICE A DAY   guaiFENesin 600 MG 12 hr tablet Commonly known as: MUCINEX Take 2 tablets (1,200 mg total) by mouth 2 (two) times daily.   hydrALAZINE 100 MG tablet Commonly known as: APRESOLINE Take 1 tablet (100 mg total) by mouth 3 (three) times daily. What changed:  how much to take how to take this when to take this additional instructions Another medication with the same name was removed. Continue taking this medication, and follow the directions you see here.   levothyroxine 50 MCG tablet Commonly known as: SYNTHROID Take 1 tablet (50 mcg total) by mouth daily before breakfast. What changed: See the new instructions.  LORazepam 1 MG tablet Commonly known as: ATIVAN TAKE 1 TABLET BY MOUTH 2 TIMES DAILY AS NEEDED FOR ANXIETY. What changed:  when to take this additional instructions   mirtazapine 15 MG tablet Commonly known as: REMERON TAKE 1 TABLET BY MOUTH EVERYDAY AT BEDTIME What changed: See the new instructions.   nitroGLYCERIN 0.4 MG SL tablet Commonly known as: NITROSTAT Place 1 tablet (0.4 mg total) under the tongue every 5 (five) minutes x 3 doses as needed for chest pain.   omeprazole 40 MG capsule Commonly known as: PRILOSEC Take 1 capsule (40 mg total) by mouth daily.   ondansetron 40 MG/20ML Soln injection Commonly known as: ZOFRAN Inject 2 mL intramuscularly every 6 hours as needed for nausea or vomiting   ondansetron 8 MG disintegrating  tablet Commonly known as: ZOFRAN-ODT Take 1 tablet (8 mg total) by mouth every 8 (eight) hours as needed for nausea or vomiting.   rosuvastatin 20 MG tablet Commonly known as: CRESTOR TAKE 1 TABLET BY MOUTH EVERY DAY   sodium bicarbonate 650 MG tablet Take 1 tablet (650 mg total) by mouth 3 (three) times daily.   Torsemide 40 MG Tabs Take 40 mg by mouth 2 (two) times daily.   Veltassa 8.4 g packet Generic drug: patiromer Take 8.4 g by mouth See admin instructions. T, Th, Sat        Follow-up Information     Myrlene Broker, MD. Schedule an appointment as soon as possible for a visit in 1 week(s).   Specialty: Internal Medicine Contact information: [address] [place] [address] [phone]                Allergies  Allergen Reactions   Xyzal [Levocetirizine Dihydrochloride] Itching   Augmentin [Amoxicillin-Pot Clavulanate] Diarrhea and Nausea And Vomiting   Tribenzor [Olmesartan-Amlodipine-Hctz] Other (See Comments)    "Hurt the kidneys"    Consultations: Nephrology, palliative care   Procedures/Studies: DG CHEST PORT 1 VIEW  Result Date: 09/05/2023 CLINICAL DATA:  [id number] Pulmonary edema [id number] EXAM: PORTABLE CHEST 1 VIEW COMPARISON:  September 03, 2023, August 29, 2023 FINDINGS: The cardiomediastinal silhouette is unchanged in contour.Atherosclerotic calcifications. No pleural effusion. No pneumothorax. Persistent mild diffuse interstitial prominence, favored slightly improved since September 15th. IMPRESSION: Persistent pulmonary edema, favored slightly improved since September 15th. Electronically Signed   By: Meda Klinefelter M.D.   On: 09/05/2023 17:14   DG CHEST PORT 1 VIEW  Result Date: 09/03/2023 CLINICAL DATA:  [id number] SOB (shortness of breath) [id number] EXAM: PORTABLE CHEST 1 VIEW COMPARISON:  CXR 08/29/23 FINDINGS: No pleural effusion. Pneumothorax. Cardiomegaly. There are prominent bilateral interstitial opacities likely represent  pulmonary venous congestion. No radiographically apparent displaced rib fractures. Visualized upper abdomen is unremarkable. IMPRESSION: Cardiomegaly with pulmonary venous congestion. Electronically Signed   By: Lorenza Cambridge M.D.   On: 09/03/2023 10:51   DG Chest 2 View  Result Date: 08/29/2023 CLINICAL DATA:  Chest pain EXAM: CHEST - 2 VIEW COMPARISON:  04/12/2023 FINDINGS: Cardiac shadow is enlarged but stable. Aortic calcifications are noted. Lungs are well aerated bilaterally. Diffuse interstitial changes and mild vascular congestion are seen consistent with congestive failure and mild edema. No sizable effusion is noted. No focal confluent infiltrate is seen. IMPRESSION: Worsening CHF with edema. Electronically Signed   By: Alcide Clever M.D.   On: 08/29/2023 23:14      Subjective: Patient seen examined at bedside today.  Appears comfortable.  Denies any worsening shortness of breath or cough.  Does not  look significantly volume overloaded.  Plan for discharge home today.  I called her son yesterday and updated about her plan.  I called him again today,call not received  Discharge Exam: Vitals:   09/07/23 0805 09/07/23 0921  BP:  127/61  Pulse: 83 80  Resp:    Temp:  98 F (36.7 C)  SpO2: 97% 98%   Vitals:   09/07/23 0500 09/07/23 0521 09/07/23 0805 09/07/23 0921  BP:  (!) 131/58  127/61  Pulse:  75 83 80  Resp:      Temp:  98.3 F (36.8 C)  98 F (36.7 C)  TempSrc:    Oral  SpO2:  98% 97% 98%  Weight: 50.7 kg     Height:        General: Pt is alert, awake, not in acute distress,trach stoma.Appears deconditioned Cardiovascular: RRR, S1/S2 +, no rubs, no gallops Respiratory: CTA bilaterally, no wheezing, no rhonchi Abdominal: Soft, NT, ND, bowel sounds + Extremities: no edema, no cyanosis    The results of significant diagnostics from this hospitalization (including imaging, microbiology, ancillary and laboratory) are listed below for reference.      Microbiology: Recent Results (from the past 240 hour(s))  MRSA Next Gen by PCR, Nasal     Status: Abnormal   Collection Time: 08/30/23  9:53 PM   Specimen: Nasal Mucosa; Nasal Swab  Result Value Ref Range Status   MRSA by PCR Next Gen DETECTED (A) NOT DETECTED Final    Comment: RESULT CALLED TO, READ BACK BY AND VERIFIED WITH: B WARNER,RN@2338  08/30/23 MK (NOTE) The GeneXpert MRSA Assay (FDA approved for NASAL specimens only), is one component of a comprehensive MRSA colonization surveillance program. It is not intended to diagnose MRSA infection nor to guide or monitor treatment for MRSA infections. Test performance is not FDA approved in patients less than 60 years old. Performed at [facility], [address], [place], [place] [address]   Culture, BAL-quantitative w Gram Stain     Status: Abnormal   Collection Time: 08/31/23  4:34 AM   Specimen: Bronchoalveolar Lavage; Respiratory  Result Value Ref Range Status   Specimen Description BRONCHIAL ALVEOLAR LAVAGE  Final   Special Requests NONE  Final   Gram Stain   Final    FEW WBC PRESENT,BOTH PMN AND MONONUCLEAR FEW YEAST WITH PSEUDOHYPHAE FEW GRAM POSITIVE COCCI IN PAIRS FEW GRAM NEGATIVE RODS Performed at [facility], [address], [place], [place] [address]    Culture (A)  Final    >=100,000 COLONIES/mL ESCHERICHIA COLI 40,000 COLONIES/mL PSEUDOMONAS AERUGINOSA    Report Status 09/04/2023 FINAL  Final   Organism ID, Bacteria ESCHERICHIA COLI (A)  Final   Organism ID, Bacteria PSEUDOMONAS AERUGINOSA (A)  Final      Susceptibility   Escherichia coli - MIC*    AMPICILLIN >=32 RESISTANT Resistant     CEFEPIME <=0.12 SENSITIVE Sensitive     CEFTAZIDIME 4 SENSITIVE Sensitive     CEFTRIAXONE 4 RESISTANT Resistant     CIPROFLOXACIN <=0.25 SENSITIVE Sensitive     GENTAMICIN >=16 RESISTANT Resistant     IMIPENEM 0.5 SENSITIVE Sensitive     TRIMETH/SULFA <=20 SENSITIVE Sensitive     AMPICILLIN/SULBACTAM  16 INTERMEDIATE Intermediate     PIP/TAZO <=4 SENSITIVE Sensitive     * >=100,000 COLONIES/mL ESCHERICHIA COLI   Pseudomonas aeruginosa - MIC*    CEFTAZIDIME 4 SENSITIVE Sensitive     CIPROFLOXACIN <=0.25 SENSITIVE Sensitive     GENTAMICIN  4 SENSITIVE Sensitive     IMIPENEM 1 SENSITIVE Sensitive     PIP/TAZO 16 SENSITIVE Sensitive     CEFEPIME 4 SENSITIVE Sensitive     * 40,000 COLONIES/mL PSEUDOMONAS AERUGINOSA     Labs: BNP (last 3 results) Recent Labs    10/28/22 2212 04/06/23 0946 08/30/23 0027  BNP 559.7* 850.0* 879.1*   Basic Metabolic Panel: Recent Labs  Lab 09/01/23 0333 09/02/23 0459 09/03/23 0907 09/04/23 0557 09/05/23 1228  NA 133* 133* 133* 133* 134*  K 4.6 4.8 4.4 5.0 4.6  CL 95* 96* 93* 92* 94*  CO2 26 25 20* 22 22  GLUCOSE 99 89 176* 110* 121*  BUN 97* 104* 109* 115* 116*  CREATININE 7.86* 8.49* 9.24* 9.20* 8.73*  CALCIUM 8.5* 7.8* 7.8* 7.9* 8.0*   Liver Function Tests: Recent Labs  Lab 09/01/23 0333 09/02/23 0459 09/03/23 0907  AST 15 14* 16  ALT 10 9 8   ALKPHOS 72 66 66  BILITOT 0.8 0.9 1.0  PROT 5.9* 5.9* 5.9*  ALBUMIN 2.9* 2.9* 2.8*   No results for input(s): "LIPASE", "AMYLASE" in the last 168 hours. No results for input(s): "AMMONIA" in the last 168 hours. CBC: Recent Labs  Lab 09/01/23 0333 09/02/23 0459 09/04/23 0557  WBC 7.4 8.0 10.2  HGB 8.4* 8.2* 8.3*  HCT 26.5* 25.0* 25.4*  MCV 90.4 91.2 89.1  PLT 244 226 209   Cardiac Enzymes: No results for input(s): "CKTOTAL", "CKMB", "CKMBINDEX", "TROPONINI" in the last 168 hours. BNP: Invalid input(s): "POCBNP" CBG: No results for input(s): "GLUCAP" in the last 168 hours. D-Dimer No results for input(s): "DDIMER" in the last 72 hours. Hgb A1c No results for input(s): "HGBA1C" in the last 72 hours. Lipid Profile No results for input(s): "CHOL", "HDL", "LDLCALC", "TRIG", "CHOLHDL", "LDLDIRECT" in the last 72 hours. Thyroid function studies No results for input(s): "TSH",  "T4TOTAL", "T3FREE", "THYROIDAB" in the last 72 hours.  Invalid input(s): "FREET3" Anemia work up No results for input(s): "VITAMINB12", "FOLATE", "FERRITIN", "TIBC", "IRON", "RETICCTPCT" in the last 72 hours. Urinalysis    Component Value Date/Time   COLORURINE YELLOW 10/29/2022 0358   APPEARANCEUR CLEAR 10/29/2022 0358   LABSPEC 1.010 10/29/2022 0358   PHURINE 6.0 10/29/2022 0358   GLUCOSEU NEGATIVE 10/29/2022 0358   GLUCOSEU NEGATIVE 08/11/2021 0942   HGBUR NEGATIVE 10/29/2022 0358   BILIRUBINUR NEGATIVE 10/29/2022 0358   BILIRUBINUR Negative 01/19/2019 1510   KETONESUR NEGATIVE 10/29/2022 0358   PROTEINUR >=300 (A) 10/29/2022 0358   UROBILINOGEN 0.2 08/11/2021 0942   NITRITE NEGATIVE 10/29/2022 0358   LEUKOCYTESUR NEGATIVE 10/29/2022 0358   Sepsis Labs Recent Labs  Lab 09/01/23 0333 09/02/23 0459 09/04/23 0557  WBC 7.4 8.0 10.2   Microbiology Recent Results (from the past 240 hour(s))  MRSA Next Gen by PCR, Nasal     Status: Abnormal   Collection Time: 08/30/23  9:53 PM   Specimen: Nasal Mucosa; Nasal Swab  Result Value Ref Range Status   MRSA by PCR Next Gen DETECTED (A) NOT DETECTED Final    Comment: RESULT CALLED TO, READ BACK BY AND VERIFIED WITH: B WARNER,RN@2338  08/30/23 MK (NOTE) The GeneXpert MRSA Assay (FDA approved for NASAL specimens only), is one component of a comprehensive MRSA colonization surveillance program. It is not intended to diagnose MRSA infection nor to guide or monitor treatment for MRSA infections. Test performance is not FDA approved in patients less than 27 years old. Performed at [facility], [address] [place], [place] [address]  Culture, BAL-quantitative w Gram Stain     Status: Abnormal   Collection Time: 08/31/23  4:34 AM   Specimen: Bronchoalveolar Lavage; Respiratory  Result Value Ref Range Status   Specimen Description BRONCHIAL ALVEOLAR LAVAGE  Final   Special Requests NONE  Final   Gram Stain   Final     FEW WBC PRESENT,BOTH PMN AND MONONUCLEAR FEW YEAST WITH PSEUDOHYPHAE FEW GRAM POSITIVE COCCI IN PAIRS FEW GRAM NEGATIVE RODS Performed at [facility], [address], [place], [place] [address]    Culture (A)  Final    >=100,000 COLONIES/mL ESCHERICHIA COLI 40,000 COLONIES/mL PSEUDOMONAS AERUGINOSA    Report Status 09/04/2023 FINAL  Final   Organism ID, Bacteria ESCHERICHIA COLI (A)  Final   Organism ID, Bacteria PSEUDOMONAS AERUGINOSA (A)  Final      Susceptibility   Escherichia coli - MIC*    AMPICILLIN >=32 RESISTANT Resistant     CEFEPIME <=0.12 SENSITIVE Sensitive     CEFTAZIDIME 4 SENSITIVE Sensitive     CEFTRIAXONE 4 RESISTANT Resistant     CIPROFLOXACIN <=0.25 SENSITIVE Sensitive     GENTAMICIN >=16 RESISTANT Resistant     IMIPENEM 0.5 SENSITIVE Sensitive     TRIMETH/SULFA <=20 SENSITIVE Sensitive     AMPICILLIN/SULBACTAM 16 INTERMEDIATE Intermediate     PIP/TAZO <=4 SENSITIVE Sensitive     * >=100,000 COLONIES/mL ESCHERICHIA COLI   Pseudomonas aeruginosa - MIC*    CEFTAZIDIME 4 SENSITIVE Sensitive     CIPROFLOXACIN <=0.25 SENSITIVE Sensitive     GENTAMICIN 4 SENSITIVE Sensitive     IMIPENEM 1 SENSITIVE Sensitive     PIP/TAZO 16 SENSITIVE Sensitive     CEFEPIME 4 SENSITIVE Sensitive     * 40,000 COLONIES/mL PSEUDOMONAS AERUGINOSA    Please note: You were cared for by a hospitalist during your hospital stay. Once you are discharged, your primary care physician will handle any further medical issues. Please note that NO REFILLS for any discharge medications will be authorized once you are discharged, as it is imperative that you return to your primary care physician (or establish a relationship with a primary care physician if you do not have one) for your post hospital discharge needs so that they can reassess your need for medications and monitor your lab values.    Time coordinating discharge: 40 minutes  SIGNED:   Burnadette Pop, MD  [facility] 09/07/2023, 9:56 AM Pager [phone]  If 7PM-7AM, please contact night-coverage [email or web address] Password [email or web address]

## 2023-09-07 NOTE — Progress Notes (Signed)
Patient ID: [name], female   DOB: 12-25-1945, 77 y.o.   MRN: [id number]    Progress Note from the Palliative Medicine Team at [facility]   Patient Name: [name]        Date: 09/07/2023 DOB: [birth year]  Age: 77 y.o. MRN#: [id number] Attending Physician: Ann Pop, MD Primary Care Physician: Ann Broker, MD Admit Date: 08/29/2023   Reason for Consultation/Follow-up   Establishing goals of care   HPI/ Brief Hospital Review  77 year old female with history of neck cancer status post laryngectomy now has ostomy, CKD stage V, history of CVA, DVT, hypertension, hypothyroidism, paroxysmal A-fib, peripheral artery disease, coronary artery disease, anxiety disorder, chronic anemia who presented with chest pain, shortness of breath.    Severely hypertensive on presentation and had to be sent to ICU for clevidipine drip.    Overall status has significantly improved but her kidney function worsened.    Currently TOD with Spanish Interpretor at bedside. Son is on the phone/video chat. Plan is for discharge home today.  Subjective  Extensive chart review has been completed prior to meeting with patient/family  including labs, vital signs, imaging, progress/consult notes, orders, medications and available advance directive documents.   Patient is alert and appears comfortable.  Education offered  to son on Hospice benefit; philosophy and eligibility,  declined at this time.  Education on self referral for future needs.   Education offered today regarding  the importance of continued conversation with family and their  medical providers regarding overall plan of care and treatment options,  ensuring decisions are within the context of the patients values and GOCs.  Questions and concerns addressed   Discussed with primary team and nursing staff   Time:  25  minutes  Detailed review of medical records ( labs, imaging, vital signs), medically appropriate  exam ( MS, skin, cardiac,  resp)   discussed with treatment team, counseling and education to patient, family, staff, documenting clinical information, medication management, coordination of care    Ann Creed NP  Palliative Medicine Team Team Phone # [phone] Pager [phone]

## 2023-09-07 NOTE — Progress Notes (Signed)
SATURATION QUALIFICATIONS: (This note is used to comply with regulatory documentation for home oxygen)  Patient Saturations on Room Air at Rest = 98%  Patient Saturations on Room Air while Ambulating = 95%

## 2023-09-07 NOTE — TOC Transition Note (Signed)
Transition of Care [facility]) - CM/SW Discharge Note   Patient Details  Name: [name] MRN: [id number] Date of Birth: April 06, 1946  Transition of Care [facility]) CM/SW Contact:  Tom-Johnson, Hershal Coria, RN Phone Number: 09/07/2023, 1:22 PM   Clinical Narrative:     Patient is scheduled for discharge today.  Readmission Risk Assessment done. Outpatient f/u, hospital f/u and discharge instructions on AVS. Patient and son, Simonne Come states patient has all necessary supplies at home. States patient received three months supply from [facility] before she was admitted.  Prescriptions sent to [facility] pharmacy and meds will be delivered to patient at bedside prior discharge. No TOC needs or recommendations noted. Son, Simonne Come to transport at discharge.  No further TOC needs noted.          Final next level of care: Home/Self Care Barriers to Discharge: Barriers Resolved   Patient Goals and CMS Choice CMS Medicare.gov Compare Post Acute Care list provided to:: Patient Choice offered to / list presented to : NA  Discharge Placement                  Patient to be transferred to facility by: Son Name of family member notified: Clovis Riley    Discharge Plan and Services Additional resources added to the After Visit Summary for                  DME Arranged: N/A DME Agency: NA       HH Arranged: NA HH Agency: NA        Social Determinants of Health (SDOH) Interventions SDOH Screenings   Food Insecurity: No Food Insecurity (04/06/2023)  Housing: Low Risk  (04/06/2023)  Transportation Needs: No Transportation Needs (04/06/2023)  Utilities: Not At Risk (04/06/2023)  Alcohol Screen: Low Risk  (10/23/2022)  Depression (PHQ2-9): Low Risk  (07/12/2023)  Financial Resource Strain: Low Risk  (10/23/2022)  Physical Activity: Inactive (10/23/2022)  Social Connections: Socially Isolated (10/23/2022)  Stress: No Stress Concern Present (10/23/2022)  Tobacco Use: Medium Risk  (08/30/2023)     Readmission Risk Interventions    09/07/2023    1:20 PM 04/07/2023   12:23 PM 10/30/2022    4:48 PM  Readmission Risk Prevention Plan  Transportation Screening Complete Complete Complete  Medication Review (RN Care Manager) Referral to Pharmacy Complete Complete  PCP or Specialist appointment within 3-5 days of discharge Complete  --  HRI or Home Care Consult Complete Complete Complete  SW Recovery Care/Counseling Consult Complete Complete Complete  Palliative Care Screening Not Applicable Not Applicable Complete  Skilled Nursing Facility Not Applicable Not Applicable Not Applicable

## 2023-09-09 ENCOUNTER — Encounter (HOSPITAL_COMMUNITY)
Admission: RE | Admit: 2023-09-09 | Discharge: 2023-09-09 | Disposition: A | Discharge status: Home / Self Care | Payer: Medicare Other | Source: Ambulatory Visit | Attending: Nephrology | Admitting: Nephrology

## 2023-09-09 VITALS — BP 134/56 | HR 68 | Temp 97.3°F | Resp 16

## 2023-09-09 DIAGNOSIS — D649 Anemia, unspecified: Secondary | ICD-10-CM | POA: Diagnosis not present

## 2023-09-09 DIAGNOSIS — N185 Chronic kidney disease, stage 5: Secondary | ICD-10-CM | POA: Diagnosis not present

## 2023-09-09 DIAGNOSIS — D631 Anemia in chronic kidney disease: Secondary | ICD-10-CM | POA: Diagnosis not present

## 2023-09-09 DIAGNOSIS — N189 Chronic kidney disease, unspecified: Secondary | ICD-10-CM | POA: Diagnosis not present

## 2023-09-09 LAB — POCT HEMOGLOBIN-HEMACUE: Hemoglobin: 8.9 g/dL — ABNORMAL LOW (ref 12.0–15.0)

## 2023-09-09 MED ORDER — EPOETIN ALFA-EPBX 10000 UNIT/ML IJ SOLN
INTRAMUSCULAR | Status: AC
  Filled 2023-09-09: qty 2

## 2023-09-09 MED ORDER — EPOETIN ALFA-EPBX 10000 UNIT/ML IJ SOLN
20000.0000 [IU] | INTRAMUSCULAR | Status: DC
  Administered 2023-09-09: 20000 [IU] via SUBCUTANEOUS

## 2023-09-11 ENCOUNTER — Other Ambulatory Visit: Payer: Self-pay | Admitting: Internal Medicine

## 2023-09-23 ENCOUNTER — Encounter (HOSPITAL_COMMUNITY)
Admission: RE | Admit: 2023-09-23 | Discharge: 2023-09-23 | Disposition: A | Discharge status: Home / Self Care | Payer: Medicare Other | Source: Ambulatory Visit | Attending: Nephrology | Admitting: Nephrology

## 2023-09-23 VITALS — BP 133/61 | HR 77 | Temp 97.3°F | Resp 16

## 2023-09-23 DIAGNOSIS — N185 Chronic kidney disease, stage 5: Secondary | ICD-10-CM | POA: Insufficient documentation

## 2023-09-23 DIAGNOSIS — D631 Anemia in chronic kidney disease: Secondary | ICD-10-CM | POA: Diagnosis not present

## 2023-09-23 DIAGNOSIS — D649 Anemia, unspecified: Secondary | ICD-10-CM | POA: Insufficient documentation

## 2023-09-23 LAB — IRON AND TIBC
Iron: 60 ug/dL (ref 28–170)
Saturation Ratios: 32 % — ABNORMAL HIGH (ref 10.4–31.8)
TIBC: 189 ug/dL — ABNORMAL LOW (ref 250–450)
UIBC: 129 ug/dL

## 2023-09-23 LAB — FERRITIN: Ferritin: 907 ng/mL — ABNORMAL HIGH (ref 11–307)

## 2023-09-23 LAB — POCT HEMOGLOBIN-HEMACUE: Hemoglobin: 8.3 g/dL — ABNORMAL LOW (ref 12.0–15.0)

## 2023-09-23 MED ORDER — EPOETIN ALFA-EPBX 10000 UNIT/ML IJ SOLN
INTRAMUSCULAR | Status: AC
  Administered 2023-09-23: 20000 [IU] via SUBCUTANEOUS
  Filled 2023-09-23: qty 2

## 2023-09-23 MED ORDER — EPOETIN ALFA-EPBX 10000 UNIT/ML IJ SOLN: 20000.0000 [IU] | INTRAMUSCULAR | Status: DC

## 2023-09-28 DIAGNOSIS — N2581 Secondary hyperparathyroidism of renal origin: Secondary | ICD-10-CM | POA: Diagnosis not present

## 2023-09-28 DIAGNOSIS — E875 Hyperkalemia: Secondary | ICD-10-CM | POA: Diagnosis not present

## 2023-09-28 DIAGNOSIS — I130 Hypertensive heart and chronic kidney disease with heart failure and stage 1 through stage 4 chronic kidney disease, or unspecified chronic kidney disease: Secondary | ICD-10-CM | POA: Diagnosis not present

## 2023-09-28 DIAGNOSIS — N185 Chronic kidney disease, stage 5: Secondary | ICD-10-CM | POA: Diagnosis not present

## 2023-09-28 DIAGNOSIS — D631 Anemia in chronic kidney disease: Secondary | ICD-10-CM | POA: Diagnosis not present

## 2023-09-28 DIAGNOSIS — I5030 Unspecified diastolic (congestive) heart failure: Secondary | ICD-10-CM | POA: Diagnosis not present

## 2023-09-29 LAB — LAB REPORT - SCANNED: EGFR: 5

## 2023-10-07 ENCOUNTER — Other Ambulatory Visit (HOSPITAL_COMMUNITY): Payer: Self-pay

## 2023-10-07 ENCOUNTER — Encounter: Payer: Self-pay | Admitting: Nephrology

## 2023-10-07 ENCOUNTER — Encounter (HOSPITAL_COMMUNITY)
Admission: RE | Admit: 2023-10-07 | Discharge: 2023-10-07 | Disposition: A | Discharge status: Home / Self Care | Payer: Medicare Other | Source: Ambulatory Visit | Attending: Nephrology | Admitting: Nephrology

## 2023-10-07 VITALS — BP 167/60 | HR 91 | Temp 97.3°F | Resp 16

## 2023-10-07 DIAGNOSIS — D649 Anemia, unspecified: Secondary | ICD-10-CM

## 2023-10-07 DIAGNOSIS — D631 Anemia in chronic kidney disease: Secondary | ICD-10-CM | POA: Diagnosis not present

## 2023-10-07 DIAGNOSIS — N185 Chronic kidney disease, stage 5: Secondary | ICD-10-CM | POA: Diagnosis not present

## 2023-10-07 MED ORDER — EPOETIN ALFA-EPBX 10000 UNIT/ML IJ SOLN
20000.0000 [IU] | INTRAMUSCULAR | Status: DC
  Administered 2023-10-07: 20000 [IU] via SUBCUTANEOUS

## 2023-10-07 MED ORDER — EPOETIN ALFA-EPBX 10000 UNIT/ML IJ SOLN
INTRAMUSCULAR | Status: AC
  Filled 2023-10-07: qty 2

## 2023-10-08 ENCOUNTER — Other Ambulatory Visit: Payer: Self-pay

## 2023-10-08 ENCOUNTER — Ambulatory Visit: Payer: Medicare Other | Admitting: Internal Medicine

## 2023-10-08 DIAGNOSIS — R112 Nausea with vomiting, unspecified: Secondary | ICD-10-CM

## 2023-10-08 LAB — POCT HEMOGLOBIN-HEMACUE: Hemoglobin: 8.6 g/dL — ABNORMAL LOW (ref 12.0–15.0)

## 2023-10-08 MED ORDER — ACETAMINOPHEN-CODEINE 300-30 MG PO TABS: 1.0000 | ORAL_TABLET | Freq: Four times a day (QID) | ORAL | 2 refills | Status: DC | PRN

## 2023-10-08 MED ORDER — CYCLOBENZAPRINE HCL 5 MG PO TABS: 5.0000 mg | ORAL_TABLET | Freq: Every day | ORAL | 1 refills | Status: DC

## 2023-10-08 MED ORDER — AMLODIPINE BESYLATE 10 MG PO TABS: 10.0000 mg | ORAL_TABLET | Freq: Every day | ORAL | 1 refills | Status: DC

## 2023-10-08 MED ORDER — CLONIDINE HCL 0.2 MG PO TABS: 0.2000 mg | ORAL_TABLET | Freq: Two times a day (BID) | ORAL | 1 refills | Status: DC

## 2023-10-08 MED ORDER — ONDANSETRON HCL 40 MG/20ML IJ SOLN: INTRAMUSCULAR | 3 refills | Status: DC

## 2023-10-08 MED ORDER — ONDANSETRON 8 MG PO TBDP: 8.0000 mg | ORAL_TABLET | Freq: Three times a day (TID) | ORAL | 2 refills | Status: DC | PRN

## 2023-10-08 MED ORDER — CARVEDILOL 25 MG PO TABS: 25.0000 mg | ORAL_TABLET | Freq: Two times a day (BID) | ORAL | 1 refills | Status: DC

## 2023-10-08 NOTE — Telephone Encounter (Signed)
No questions

## 2023-10-08 NOTE — Addendum Note (Signed)
Addended by: Hillard Danker A on: 10/08/2023 11:15 AM   Modules accepted: Orders

## 2023-10-08 NOTE — Telephone Encounter (Signed)
There is nothing in this encounter. Is there a question here?

## 2023-10-21 ENCOUNTER — Encounter (HOSPITAL_COMMUNITY)
Admission: RE | Admit: 2023-10-21 | Discharge: 2023-10-21 | Disposition: A | Discharge status: Home / Self Care | Payer: Medicare Other | Source: Ambulatory Visit | Attending: Nephrology | Admitting: Nephrology

## 2023-10-21 VITALS — BP 120/57 | HR 85 | Temp 97.1°F | Resp 16

## 2023-10-21 DIAGNOSIS — D649 Anemia, unspecified: Secondary | ICD-10-CM

## 2023-10-21 DIAGNOSIS — N185 Chronic kidney disease, stage 5: Secondary | ICD-10-CM | POA: Insufficient documentation

## 2023-10-21 DIAGNOSIS — D631 Anemia in chronic kidney disease: Secondary | ICD-10-CM | POA: Diagnosis not present

## 2023-10-21 LAB — FERRITIN: Ferritin: 1222 ng/mL — ABNORMAL HIGH (ref 11–307)

## 2023-10-21 LAB — IRON AND TIBC
Iron: 40 ug/dL (ref 28–170)
Saturation Ratios: 24 % (ref 10.4–31.8)
TIBC: 169 ug/dL — ABNORMAL LOW (ref 250–450)
UIBC: 129 ug/dL

## 2023-10-21 LAB — POCT HEMOGLOBIN-HEMACUE: Hemoglobin: 8.6 g/dL — ABNORMAL LOW (ref 12.0–15.0)

## 2023-10-21 MED ORDER — EPOETIN ALFA-EPBX 10000 UNIT/ML IJ SOLN
INTRAMUSCULAR | Status: AC
  Filled 2023-10-21: qty 2

## 2023-10-21 MED ORDER — EPOETIN ALFA-EPBX 10000 UNIT/ML IJ SOLN
20000.0000 [IU] | INTRAMUSCULAR | Status: DC
  Administered 2023-10-21: 20000 [IU] via SUBCUTANEOUS

## 2023-10-22 ENCOUNTER — Ambulatory Visit: Payer: Medicare Other | Admitting: Internal Medicine

## 2023-11-04 ENCOUNTER — Encounter (HOSPITAL_COMMUNITY)
Admission: RE | Admit: 2023-11-04 | Discharge: 2023-11-04 | Disposition: A | Discharge status: Home / Self Care | Payer: Medicare Other | Source: Ambulatory Visit | Attending: Nephrology | Admitting: Nephrology

## 2023-11-04 VITALS — BP 144/56 | HR 85 | Temp 97.5°F | Resp 16

## 2023-11-04 DIAGNOSIS — N185 Chronic kidney disease, stage 5: Secondary | ICD-10-CM | POA: Diagnosis not present

## 2023-11-04 DIAGNOSIS — D631 Anemia in chronic kidney disease: Secondary | ICD-10-CM | POA: Diagnosis not present

## 2023-11-04 DIAGNOSIS — D649 Anemia, unspecified: Secondary | ICD-10-CM

## 2023-11-04 LAB — POCT HEMOGLOBIN-HEMACUE: Hemoglobin: 9.1 g/dL — ABNORMAL LOW (ref 12.0–15.0)

## 2023-11-04 MED ORDER — EPOETIN ALFA-EPBX 10000 UNIT/ML IJ SOLN
INTRAMUSCULAR | Status: AC
  Administered 2023-11-04: 20000 [IU] via SUBCUTANEOUS
  Filled 2023-11-04: qty 2

## 2023-11-04 MED ORDER — EPOETIN ALFA-EPBX 10000 UNIT/ML IJ SOLN
20000.0000 [IU] | INTRAMUSCULAR | Status: DC
Start: 2023-11-04 — End: 2023-11-05

## 2023-11-07 ENCOUNTER — Other Ambulatory Visit: Payer: Self-pay | Admitting: Internal Medicine

## 2023-11-09 ENCOUNTER — Other Ambulatory Visit: Payer: Self-pay | Admitting: Internal Medicine

## 2023-11-18 ENCOUNTER — Encounter (HOSPITAL_COMMUNITY)
Admission: RE | Admit: 2023-11-18 | Discharge: 2023-11-18 | Disposition: A | Discharge status: Home / Self Care | Payer: Medicare Other | Source: Ambulatory Visit | Attending: Nephrology | Admitting: Nephrology

## 2023-11-18 VITALS — BP 122/52 | HR 77 | Temp 97.3°F | Resp 16

## 2023-11-18 DIAGNOSIS — D649 Anemia, unspecified: Secondary | ICD-10-CM | POA: Insufficient documentation

## 2023-11-18 DIAGNOSIS — D631 Anemia in chronic kidney disease: Secondary | ICD-10-CM | POA: Insufficient documentation

## 2023-11-18 DIAGNOSIS — N185 Chronic kidney disease, stage 5: Secondary | ICD-10-CM | POA: Diagnosis not present

## 2023-11-18 LAB — IRON AND TIBC
Iron: 27 ug/dL — ABNORMAL LOW (ref 28–170)
Saturation Ratios: 17 % (ref 10.4–31.8)
TIBC: 155 ug/dL — ABNORMAL LOW (ref 250–450)
UIBC: 128 ug/dL

## 2023-11-18 LAB — FERRITIN: Ferritin: 1156 ng/mL — ABNORMAL HIGH (ref 11–307)

## 2023-11-18 MED ORDER — EPOETIN ALFA-EPBX 10000 UNIT/ML IJ SOLN
20000.0000 [IU] | INTRAMUSCULAR | Status: DC
Start: 2023-11-18 — End: 2023-11-19
  Administered 2023-11-18: 20000 [IU] via SUBCUTANEOUS

## 2023-11-18 MED ORDER — EPOETIN ALFA-EPBX 10000 UNIT/ML IJ SOLN
INTRAMUSCULAR | Status: AC
  Filled 2023-11-18: qty 2

## 2023-11-18 NOTE — Progress Notes (Signed)
Pt here for Retacrit injection.  HGB 7.9 via hemocue.  Pt states that she has had vomiting and diarrhea for a couple of days with dark stools and difficulty eating.  Dr Doristine Church office notified and instructed for shot to be given as directed and for pt to go to the ER.  This was shared with the pt and her daughter and they chose not to go to the ER.  States they will monitor stools and watch for bleeding and will return to ER or call MD if needed.  MD made aware of pt's choice

## 2023-11-19 LAB — POCT HEMOGLOBIN-HEMACUE: Hemoglobin: 7.9 g/dL — ABNORMAL LOW (ref 12.0–15.0)

## 2023-11-24 ENCOUNTER — Other Ambulatory Visit: Payer: Self-pay

## 2023-12-02 ENCOUNTER — Encounter (HOSPITAL_COMMUNITY): Payer: Medicare Other

## 2023-12-03 ENCOUNTER — Encounter (HOSPITAL_COMMUNITY)
Admission: RE | Admit: 2023-12-03 | Discharge: 2023-12-03 | Disposition: A | Discharge status: Home / Self Care | Payer: Medicare Other | Source: Ambulatory Visit | Attending: Nephrology | Admitting: Nephrology

## 2023-12-03 VITALS — BP 110/57 | HR 75 | Temp 97.0°F | Resp 16

## 2023-12-03 DIAGNOSIS — D649 Anemia, unspecified: Secondary | ICD-10-CM

## 2023-12-03 DIAGNOSIS — D631 Anemia in chronic kidney disease: Secondary | ICD-10-CM | POA: Diagnosis not present

## 2023-12-03 DIAGNOSIS — N185 Chronic kidney disease, stage 5: Secondary | ICD-10-CM | POA: Diagnosis not present

## 2023-12-03 LAB — POCT HEMOGLOBIN-HEMACUE: Hemoglobin: 8 g/dL — ABNORMAL LOW (ref 12.0–15.0)

## 2023-12-03 MED ORDER — EPOETIN ALFA-EPBX 10000 UNIT/ML IJ SOLN
20000.0000 [IU] | INTRAMUSCULAR | Status: DC
Start: 2023-12-03 — End: 2024-12-15

## 2023-12-03 MED ORDER — EPOETIN ALFA-EPBX 10000 UNIT/ML IJ SOLN
INTRAMUSCULAR | Status: AC
  Administered 2023-12-03: 20000 [IU] via SUBCUTANEOUS
  Filled 2023-12-03: qty 2

## 2023-12-07 DIAGNOSIS — R1310 Dysphagia, unspecified: Secondary | ICD-10-CM | POA: Diagnosis not present

## 2023-12-07 DIAGNOSIS — J398 Other specified diseases of upper respiratory tract: Secondary | ICD-10-CM | POA: Diagnosis not present

## 2023-12-07 DIAGNOSIS — R498 Other voice and resonance disorders: Secondary | ICD-10-CM | POA: Diagnosis not present

## 2023-12-17 ENCOUNTER — Other Ambulatory Visit: Payer: Self-pay | Admitting: Internal Medicine

## 2023-12-17 ENCOUNTER — Encounter (HOSPITAL_COMMUNITY)
Admission: RE | Admit: 2023-12-17 | Discharge: 2023-12-17 | Disposition: A | Discharge status: Home / Self Care | Payer: Medicare Other | Source: Ambulatory Visit | Attending: Nephrology | Admitting: Nephrology

## 2023-12-17 VITALS — BP 107/54 | HR 91 | Temp 97.7°F | Resp 16

## 2023-12-17 DIAGNOSIS — D631 Anemia in chronic kidney disease: Secondary | ICD-10-CM | POA: Diagnosis not present

## 2023-12-17 DIAGNOSIS — N185 Chronic kidney disease, stage 5: Secondary | ICD-10-CM | POA: Diagnosis not present

## 2023-12-17 DIAGNOSIS — D649 Anemia, unspecified: Secondary | ICD-10-CM

## 2023-12-17 DIAGNOSIS — E785 Hyperlipidemia, unspecified: Secondary | ICD-10-CM

## 2023-12-17 LAB — IRON AND TIBC
Iron: 24 ug/dL — ABNORMAL LOW (ref 28–170)
Saturation Ratios: 18 % (ref 10.4–31.8)
TIBC: 133 ug/dL — ABNORMAL LOW (ref 250–450)
UIBC: 109 ug/dL

## 2023-12-17 LAB — FERRITIN: Ferritin: 784 ng/mL — ABNORMAL HIGH (ref 11–307)

## 2023-12-17 MED ORDER — EPOETIN ALFA-EPBX 10000 UNIT/ML IJ SOLN
20000.0000 [IU] | INTRAMUSCULAR | Status: DC
  Administered 2023-12-17: 20000 [IU] via SUBCUTANEOUS

## 2023-12-17 MED ORDER — EPOETIN ALFA-EPBX 10000 UNIT/ML IJ SOLN
INTRAMUSCULAR | Status: AC
  Filled 2023-12-17: qty 2

## 2023-12-20 LAB — POCT HEMOGLOBIN-HEMACUE: Hemoglobin: 7.9 g/dL — ABNORMAL LOW (ref 12.0–15.0)

## 2023-12-23 DIAGNOSIS — K222 Esophageal obstruction: Secondary | ICD-10-CM | POA: Diagnosis not present

## 2023-12-23 DIAGNOSIS — Z79890 Hormone replacement therapy (postmenopausal): Secondary | ICD-10-CM | POA: Diagnosis not present

## 2023-12-23 DIAGNOSIS — J398 Other specified diseases of upper respiratory tract: Secondary | ICD-10-CM | POA: Diagnosis not present

## 2023-12-23 DIAGNOSIS — I10 Essential (primary) hypertension: Secondary | ICD-10-CM | POA: Diagnosis not present

## 2023-12-23 DIAGNOSIS — Z7901 Long term (current) use of anticoagulants: Secondary | ICD-10-CM | POA: Diagnosis not present

## 2023-12-23 DIAGNOSIS — J9503 Malfunction of tracheostomy stoma: Secondary | ICD-10-CM | POA: Diagnosis not present

## 2023-12-23 DIAGNOSIS — I252 Old myocardial infarction: Secondary | ICD-10-CM | POA: Diagnosis not present

## 2023-12-23 DIAGNOSIS — Z79899 Other long term (current) drug therapy: Secondary | ICD-10-CM | POA: Diagnosis not present

## 2023-12-23 DIAGNOSIS — J386 Stenosis of larynx: Secondary | ICD-10-CM | POA: Diagnosis not present

## 2023-12-23 DIAGNOSIS — I2510 Atherosclerotic heart disease of native coronary artery without angina pectoris: Secondary | ICD-10-CM | POA: Diagnosis not present

## 2023-12-23 DIAGNOSIS — R490 Dysphonia: Secondary | ICD-10-CM | POA: Diagnosis not present

## 2023-12-23 DIAGNOSIS — Z430 Encounter for attention to tracheostomy: Secondary | ICD-10-CM | POA: Diagnosis not present

## 2023-12-23 DIAGNOSIS — Z8521 Personal history of malignant neoplasm of larynx: Secondary | ICD-10-CM | POA: Diagnosis not present

## 2023-12-23 DIAGNOSIS — R1310 Dysphagia, unspecified: Secondary | ICD-10-CM | POA: Diagnosis not present

## 2023-12-30 ENCOUNTER — Other Ambulatory Visit: Payer: Self-pay | Admitting: Internal Medicine

## 2023-12-31 ENCOUNTER — Encounter (HOSPITAL_COMMUNITY)
Admission: RE | Admit: 2023-12-31 | Discharge: 2023-12-31 | Disposition: A | Discharge status: Home / Self Care | Payer: Medicare Other | Source: Ambulatory Visit | Attending: Nephrology

## 2023-12-31 VITALS — BP 134/60 | HR 84 | Temp 97.3°F | Resp 16

## 2023-12-31 DIAGNOSIS — D631 Anemia in chronic kidney disease: Secondary | ICD-10-CM | POA: Diagnosis not present

## 2023-12-31 DIAGNOSIS — D649 Anemia, unspecified: Secondary | ICD-10-CM

## 2023-12-31 DIAGNOSIS — N185 Chronic kidney disease, stage 5: Secondary | ICD-10-CM | POA: Diagnosis not present

## 2023-12-31 LAB — POCT HEMOGLOBIN-HEMACUE: Hemoglobin: 9.6 g/dL — ABNORMAL LOW (ref 12.0–15.0)

## 2023-12-31 MED ORDER — EPOETIN ALFA-EPBX 10000 UNIT/ML IJ SOLN
INTRAMUSCULAR | Status: AC
  Filled 2023-12-31: qty 2

## 2023-12-31 MED ORDER — EPOETIN ALFA-EPBX 10000 UNIT/ML IJ SOLN
20000.0000 [IU] | INTRAMUSCULAR | Status: DC
  Administered 2023-12-31: 20000 [IU] via SUBCUTANEOUS

## 2024-01-07 ENCOUNTER — Ambulatory Visit: Payer: Self-pay | Admitting: Internal Medicine

## 2024-01-07 ENCOUNTER — Other Ambulatory Visit: Payer: Self-pay | Admitting: Internal Medicine

## 2024-01-07 NOTE — Telephone Encounter (Signed)
1st attempt, LMOM to return call to office. Routing to call back.  Copied from CRM [id number]. Topic: Clinical - Medical Advice >> Jan 07, 2024  3:45 PM Elizebeth Brooking wrote: Reason for CRM: Patient Daughter in [place] called regarding on if patient can be called in a antibiotic as she stated that patient has a very bad cough and is coughing up mucus. The number she will like to be called back on is [phone]

## 2024-01-07 NOTE — Telephone Encounter (Signed)
Copied from CRM [id number]. Topic: Clinical - Medical Advice >> Jan 07, 2024  3:45 PM Elizebeth Brooking wrote: Reason for CRM: Patient Daughter in [place] called regarding on if patient can be called in a antibiotic as she stated that patient has a very bad cough and is coughing up mucus. The number she will like to be called back on is [phone]   Chief Complaint: Cough Symptoms: Cough with green sputum production  Frequency: Frequent Pertinent Negatives: Patient denies fever or shortness of breath Disposition: [] ED /[x] Urgent Care (no appt availability in office) / [] Appointment(In office/virtual)/ []  [facility]/ [] Home Care/ [] Refused Recommended Disposition /[] [facility]/ []  Follow-up with PCP Additional Notes: Patient's daughter in law Shanda Bumps reports that the patient has been experiencing a cough with green sputum production that began 2 days ago. She states that the patient has a chronic cough but that the green sputum is new. She states that she would like to get an antibiotic for the patient. I advised her that with the offices closed she could go to urgent care with the patient for evaluation. She asked if it could be done virtually. Virtual urgent care appointment scheduled for patient for tomorrow morning.     Reason for Disposition  Cough  Answer Assessment - Initial Assessment Questions 1. ONSET: "When did the cough begin?"      Cough is chronic, green mucus started 2 days ago 2. SEVERITY: "How bad is the cough today?"      Worse than normal due to the sputum production  3. SPUTUM: "Describe the color of your sputum" (none, dry cough; clear, white, yellow, green)     Green 4. HEMOPTYSIS: "Are you coughing up any blood?" If so ask: "How much?" (flecks, streaks, tablespoons, etc.)     No 5. DIFFICULTY BREATHING: "Are you having difficulty breathing?" If Yes, ask: "How bad is it?" (e.g., mild, moderate, severe)    - MILD: No SOB at rest, mild SOB with walking, speaks  normally in sentences, can lie down, no retractions, pulse < 100.    - MODERATE: SOB at rest, SOB with minimal exertion and prefers to sit, cannot lie down flat, speaks in phrases, mild retractions, audible wheezing, pulse 100-120.    - SEVERE: Very SOB at rest, speaks in single words, struggling to breathe, sitting hunched forward, retractions, pulse > 120      No 6. FEVER: "Do you have a fever?" If Yes, ask: "What is your temperature, how was it measured, and when did it start?"     No 7. CARDIAC HISTORY: "Do you have any history of heart disease?" (e.g., heart attack, congestive heart failure)      Yes 8. LUNG HISTORY: "Do you have any history of lung disease?"  (e.g., pulmonary embolus, asthma, emphysema)     No 9. PE RISK FACTORS: "Do you have a history of blood clots?" (or: recent major surgery, recent prolonged travel, bedridden)     Yes, but now on blood thinners 10. OTHER SYMPTOMS: "Do you have any other symptoms?" (e.g., runny nose, wheezing, chest pain)       Sore throat  11. PREGNANCY: "Is there any chance you are pregnant?" "When was your last menstrual period?"       No 12. TRAVEL: "Have you traveled out of the country in the last month?" (e.g., travel history, exposures)       No  Protocols used: Cough - Acute Productive-A-AH

## 2024-01-08 ENCOUNTER — Telehealth: Payer: Medicare Other | Admitting: Family Medicine

## 2024-01-08 DIAGNOSIS — J069 Acute upper respiratory infection, unspecified: Secondary | ICD-10-CM

## 2024-01-08 DIAGNOSIS — J398 Other specified diseases of upper respiratory tract: Secondary | ICD-10-CM

## 2024-01-08 MED ORDER — AZITHROMYCIN 250 MG PO TABS: ORAL_TABLET | ORAL | 0 refills | Status: AC

## 2024-01-08 NOTE — Patient Instructions (Signed)

## 2024-01-08 NOTE — Progress Notes (Signed)
Virtual Visit Consent   [name], you are scheduled for a virtual visit with a [facility] provider today. Just as with appointments in the office, your consent must be obtained to participate. Your consent will be active for this visit and any virtual visit you may have with one of our providers in the next 365 days. If you have a MyChart account, a copy of this consent can be sent to you electronically.  As this is a virtual visit, video technology does not allow for your provider to perform a traditional examination. This may limit your provider's ability to fully assess your condition. If your provider identifies any concerns that need to be evaluated in person or the need to arrange testing (such as labs, EKG, etc.), we will make arrangements to do so. Although advances in technology are sophisticated, we cannot ensure that it will always work on either your end or our end. If the connection with a video visit is poor, the visit may have to be switched to a telephone visit. With either a video or telephone visit, we are not always able to ensure that we have a secure connection.  By engaging in this virtual visit, you consent to the provision of healthcare and authorize for your insurance to be billed (if applicable) for the services provided during this visit. Depending on your insurance coverage, you may receive a charge related to this service.  I need to obtain your verbal consent now. Are you willing to proceed with your visit today? [name] has provided verbal consent on 01/08/2024 for a virtual visit (video or telephone). Georgana Curio, FNP  Date: 01/08/2024 11:11 AM  Virtual Visit via Video Note   I, Georgana Curio, connected with  [name]  ([id number], 10-12-1946) on 01/08/24 at 10:30 AM EST by a video-enabled telemedicine application and verified that I am speaking with the correct person using two identifiers.  Location: Patient: Virtual Visit Location Patient:  Home Provider: Virtual Visit Location Provider: Home Office   I discussed the limitations of evaluation and management by telemedicine and the availability of in person appointments. The patient expressed understanding and agreed to proceed.    History of Present Illness: [name] is a 78 y.o. who identifies as a female who was assigned female at birth, and is being seen today for cough with green mucus coming from neck stoma, no fever, wheezing or sob. In no distress. Sx for 4-5 days. [name]  HPI: HPI  Problems:  Patient Active Problem List   Diagnosis Date Noted   Coronary artery disease involving native coronary artery of native heart with refractory angina pectoris (HCC) 08/30/2023   Hypertensive crisis 08/30/2023   Volume overload 08/30/2023   Chest pain 08/30/2023   Hypertensive emergency 08/30/2023   ESRD (end stage renal disease) (HCC) 08/30/2023   History of pharyngeal cancer 08/30/2023   CKD (chronic kidney disease), symptom management only, stage 5 (HCC) 08/30/2023   Spondylosis of thoracolumbar region without myelopathy or radiculopathy 04/06/2023   Acute respiratory distress 04/06/2023   Acute on chronic heart failure with preserved ejection fraction (HFpEF) (HCC) 04/06/2023   Vision loss 11/17/2022   Pain, eye, left 11/17/2022   NSTEMI (non-ST elevated myocardial infarction) (HCC) 10/29/2022   Chronic diastolic (congestive) heart failure (HCC) 09/02/2022   Alaryngeal voice 08/13/2022   History of CVA (cerebrovascular accident) 08/13/2022   Orthopnea 08/07/2022   Facial swelling 01/30/2022   History of DVT (deep vein thrombosis) 01/05/2022  Chronic anemia 01/05/2022   Demand ischemia (HCC)    History of head and neck cancer-s/p laryngectomy-with tracheostomy 01/02/2022   Paroxysmal atrial fibrillation (HCC) 01/02/2022   Neck pain 07/11/2021   Aortic atherosclerosis (HCC) 04/09/2021   Moderate protein-calorie malnutrition (HCC) 04/09/2021   Low back pain  01/23/2021   HLD (hyperlipidemia) 01/13/2021   Hypothyroidism 02/25/2020   Senile osteoporosis 02/15/2020   Peripheral arterial disease (HCC) 02/15/2020   Tinea corporis 02/15/2020   Gastroesophageal reflux disease without esophagitis 09/09/2019   Osteopenia 01/19/2019   CKD (chronic kidney disease) stage 5, GFR less than 15 ml/min (HCC) 01/19/2019   Diarrhea 11/04/2018   Thiamine deficiency 01/02/2018   Prediabetes 12/28/2017   Dietary folate deficiency anemia 12/28/2017   Moderate episode of recurrent major depressive disorder (HCC) 10/22/2017   S/P laryngectomy 11/03/2016   Stenosis of tracheal stoma 06/10/2016   Diverticulum of pharynx 08/21/2015   Esophageal obstruction 07/17/2015   Esophageal stricture 07/17/2015   Major depression, chronic 02/28/2015   Osteoporosis 02/01/2015   Dysphagia, pharyngoesophageal phase 05/22/2014   History of radiation therapy    Insomnia 02/07/2013   History of uterine cancer 11/24/2012   Chronic pain syndrome 09/03/2012   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 08/08/2012    Class: Chronic   Hypertension    GAD (generalized anxiety disorder)     Allergies:  Allergies  Allergen Reactions   Xyzal [Levocetirizine Dihydrochloride] Itching   Augmentin [Amoxicillin-Pot Clavulanate] Diarrhea and Nausea And Vomiting   Tribenzor [Olmesartan-Amlodipine-Hctz] Other (See Comments)    "Hurt the kidneys"   Medications:  Current Outpatient Medications:    azithromycin (ZITHROMAX) 250 MG tablet, Take 2 tablets on day 1, then 1 tablet daily on days 2 through 5, Disp: 6 tablet, Rfl: 0   acetaminophen-codeine (TYLENOL #3) 300-30 MG tablet, Take 1 tablet by mouth every 6 (six) hours as needed., Disp: 28 tablet, Rfl: 2   amLODipine (NORVASC) 10 MG tablet, Take 1 tablet (10 mg total) by mouth daily., Disp: 90 tablet, Rfl: 1   aspirin EC 81 MG tablet, Take 1 tablet (81 mg total) by mouth daily. Swallow whole., Disp: 30 tablet, Rfl: 12   calcitRIOL  (ROCALTROL) 0.25 MCG capsule, Take 1 capsule (0.25 mcg total) by mouth daily., Disp: 30 capsule, Rfl: 0   carvedilol (COREG) 25 MG tablet, Take 1 tablet (25 mg total) by mouth 2 (two) times daily with a meal., Disp: 180 tablet, Rfl: 1   cilostazol (PLETAL) 100 MG tablet, TAKE 1 TABLET BY MOUTH TWICE A DAY (Patient taking differently: Take 100 mg by mouth 2 (two) times daily.), Disp: 180 tablet, Rfl: 3   cloNIDine (CATAPRES - DOSED IN MG/24 HR) 0.1 mg/24hr patch, PLACE 1 PATCH (0.1 MG TOTAL) ONTO THE SKIN ONCE A WEEK., Disp: 4 patch, Rfl: 0   cloNIDine (CATAPRES) 0.2 MG tablet, TAKE 1 TABLET(0.2 MG) BY MOUTH TWICE DAILY, Disp: 180 tablet, Rfl: 1   cyclobenzaprine (FLEXERIL) 5 MG tablet, Take 1 tablet (5 mg total) by mouth at bedtime., Disp: 90 tablet, Rfl: 1   DULoxetine (CYMBALTA) 30 MG capsule, Take 1 capsule (30 mg total) by mouth daily., Disp: 90 capsule, Rfl: 3   ELIQUIS 2.5 MG TABS tablet, TAKE 1 TABLET BY MOUTH TWICE A DAY, Disp: 180 tablet, Rfl: 3   guaiFENesin (MUCINEX) 600 MG 12 hr tablet, Take 2 tablets (1,200 mg total) by mouth 2 (two) times daily., Disp: 30 tablet, Rfl: 0   hydrALAZINE (APRESOLINE) 100 MG tablet, Take 1 tablet (  100 mg total) by mouth 3 (three) times daily., Disp: 90 tablet, Rfl: 0   levothyroxine (SYNTHROID) 50 MCG tablet, Take 1 tablet (50 mcg total) by mouth daily before breakfast., Disp: 30 tablet, Rfl: 0   LORazepam (ATIVAN) 1 MG tablet, TAKE 1 TABLET BY MOUTH TWICE A DAY AS NEEDED FOR ANXIETY, Disp: 60 tablet, Rfl: 2   mirtazapine (REMERON) 15 MG tablet, TAKE 1 TABLET BY MOUTH EVERYDAY AT BEDTIME, Disp: 90 tablet, Rfl: 1   nitroGLYCERIN (NITROSTAT) 0.4 MG SL tablet, Place 1 tablet (0.4 mg total) under the tongue every 5 (five) minutes x 3 doses as needed for chest pain., Disp: 10 tablet, Rfl: 1   omeprazole (PRILOSEC) 40 MG capsule, Take 1 capsule (40 mg total) by mouth daily., Disp: 90 capsule, Rfl: 3   ondansetron (ZOFRAN) 40 MG/20ML SOLN injection, INJECT 2 ML  INTRAMUSCULARLY EVERY 6 HOURS AS NEEDED FOR NAUSEA OR VOMITING, Disp: 40 mL, Rfl: 3   ondansetron (ZOFRAN-ODT) 8 MG disintegrating tablet, Take 1 tablet (8 mg total) by mouth every 8 (eight) hours as needed for nausea or vomiting., Disp: 20 tablet, Rfl: 2   pantoprazole (PROTONIX) 40 MG tablet, TAKE 1 TABLET BY MOUTH EVERY DAY, Disp: 90 tablet, Rfl: 0   rosuvastatin (CRESTOR) 20 MG tablet, TAKE 1 TABLET BY MOUTH EVERY DAY, Disp: 90 tablet, Rfl: 1   sodium bicarbonate 650 MG tablet, Take 1 tablet (650 mg total) by mouth 3 (three) times daily., Disp: 90 tablet, Rfl: 0   SYRINGE-NEEDLE, DISP, 3 ML (B-D INTEGRA SYRINGE) 23G X 1" 3 ML MISC, Use with zofran injections, Disp: 50 each, Rfl: 3   torsemide (DEMADEX) 20 MG tablet, Take 2 tablets (40 mg total) by mouth 2 (two) times daily., Disp: 120 tablet, Rfl: 0   VELTASSA 8.4 g packet, Take 8.4 g by mouth See admin instructions. T, Th, Sat, Disp: , Rfl:   Observations/Objective: Patient is well-developed, well-nourished in no acute distress.  Resting comfortably  at home.  Head is normocephalic, atraumatic.  No labored breathing.  Speech is clear and coherent with logical content.  Patient is alert and oriented at baseline.   Assessment and Plan: 1. Tracheal stoma stenosis (Primary)  2. Upper respiratory tract infection, unspecified type  Increase fluids, humidifier at night, daughter in law with her. UC if sx persist or worsen.   Follow Up Instructions: I discussed the assessment and treatment plan with the patient. The patient was provided an opportunity to ask questions and all were answered. The patient agreed with the plan and demonstrated an understanding of the instructions.  A copy of instructions were sent to the patient via MyChart unless otherwise noted below.     The patient was advised to call back or seek an in-person evaluation if the symptoms worsen or if the condition fails to improve as anticipated.    Georgana Curio, FNP

## 2024-01-11 ENCOUNTER — Other Ambulatory Visit: Payer: Self-pay | Admitting: Internal Medicine

## 2024-01-12 ENCOUNTER — Other Ambulatory Visit: Payer: Self-pay | Admitting: Internal Medicine

## 2024-01-12 MED ORDER — BD INTEGRA SYRINGE 23G X 1" 3 ML MISC: 3 refills | Status: DC

## 2024-01-12 MED ORDER — ONDANSETRON HCL 40 MG/20ML IJ SOLN: INTRAMUSCULAR | 3 refills | Status: DC

## 2024-01-13 DIAGNOSIS — I129 Hypertensive chronic kidney disease with stage 1 through stage 4 chronic kidney disease, or unspecified chronic kidney disease: Secondary | ICD-10-CM | POA: Diagnosis not present

## 2024-01-13 DIAGNOSIS — I130 Hypertensive heart and chronic kidney disease with heart failure and stage 1 through stage 4 chronic kidney disease, or unspecified chronic kidney disease: Secondary | ICD-10-CM | POA: Diagnosis not present

## 2024-01-13 DIAGNOSIS — I5030 Unspecified diastolic (congestive) heart failure: Secondary | ICD-10-CM | POA: Diagnosis not present

## 2024-01-13 DIAGNOSIS — N2581 Secondary hyperparathyroidism of renal origin: Secondary | ICD-10-CM | POA: Diagnosis not present

## 2024-01-13 DIAGNOSIS — N189 Chronic kidney disease, unspecified: Secondary | ICD-10-CM | POA: Diagnosis not present

## 2024-01-13 DIAGNOSIS — E872 Acidosis, unspecified: Secondary | ICD-10-CM | POA: Diagnosis not present

## 2024-01-13 DIAGNOSIS — N2889 Other specified disorders of kidney and ureter: Secondary | ICD-10-CM | POA: Diagnosis not present

## 2024-01-13 DIAGNOSIS — N185 Chronic kidney disease, stage 5: Secondary | ICD-10-CM | POA: Diagnosis not present

## 2024-01-13 DIAGNOSIS — D631 Anemia in chronic kidney disease: Secondary | ICD-10-CM | POA: Diagnosis not present

## 2024-01-14 ENCOUNTER — Inpatient Hospital Stay (HOSPITAL_COMMUNITY): Admission: RE | Admit: 2024-01-14 | Payer: Medicare Other | Source: Ambulatory Visit

## 2024-01-19 ENCOUNTER — Encounter (HOSPITAL_COMMUNITY)
Admission: RE | Admit: 2024-01-19 | Discharge: 2024-01-19 | Disposition: A | Discharge status: Home / Self Care | Payer: Medicare Other | Source: Ambulatory Visit | Attending: Nephrology | Admitting: Nephrology

## 2024-01-19 VITALS — BP 122/53 | HR 73 | Temp 97.3°F | Resp 16

## 2024-01-19 DIAGNOSIS — D649 Anemia, unspecified: Secondary | ICD-10-CM

## 2024-01-19 DIAGNOSIS — N185 Chronic kidney disease, stage 5: Secondary | ICD-10-CM | POA: Insufficient documentation

## 2024-01-19 DIAGNOSIS — D631 Anemia in chronic kidney disease: Secondary | ICD-10-CM | POA: Diagnosis not present

## 2024-01-19 LAB — IRON AND TIBC
Iron: 53 ug/dL (ref 28–170)
Saturation Ratios: 39 % — ABNORMAL HIGH (ref 10.4–31.8)
TIBC: 137 ug/dL — ABNORMAL LOW (ref 250–450)
UIBC: 84 ug/dL

## 2024-01-19 LAB — POCT HEMOGLOBIN-HEMACUE: Hemoglobin: 8.1 g/dL — ABNORMAL LOW (ref 12.0–15.0)

## 2024-01-19 LAB — FERRITIN: Ferritin: 1004 ng/mL — ABNORMAL HIGH (ref 11–307)

## 2024-01-19 MED ORDER — EPOETIN ALFA-EPBX 10000 UNIT/ML IJ SOLN
20000.0000 [IU] | INTRAMUSCULAR | Status: DC
Start: 2024-01-19 — End: 2025-01-31
  Administered 2024-01-19: 20000 [IU] via SUBCUTANEOUS

## 2024-01-19 MED ORDER — EPOETIN ALFA-EPBX 10000 UNIT/ML IJ SOLN
INTRAMUSCULAR | Status: AC
  Filled 2024-01-19: qty 2

## 2024-01-28 ENCOUNTER — Encounter (HOSPITAL_COMMUNITY): Payer: Medicare Other

## 2024-02-02 ENCOUNTER — Inpatient Hospital Stay (HOSPITAL_COMMUNITY): Admission: RE | Admit: 2024-02-02 | Payer: Medicare Other | Source: Ambulatory Visit

## 2024-02-11 ENCOUNTER — Ambulatory Visit (HOSPITAL_BASED_OUTPATIENT_CLINIC_OR_DEPARTMENT_OTHER)
Admission: RE | Admit: 2024-02-11 | Discharge: 2024-02-11 | Disposition: A | Discharge status: Home / Self Care | Payer: Medicare Other | Source: Ambulatory Visit | Attending: Urgent Care | Admitting: Urgent Care

## 2024-02-11 ENCOUNTER — Ambulatory Visit
Admission: EM | Admit: 2024-02-11 | Discharge: 2024-02-11 | Disposition: A | Discharge status: Home / Self Care | Payer: No Typology Code available for payment source | Attending: Family Medicine | Admitting: Family Medicine

## 2024-02-11 DIAGNOSIS — S9032XA Contusion of left foot, initial encounter: Secondary | ICD-10-CM

## 2024-02-11 DIAGNOSIS — M79672 Pain in left foot: Secondary | ICD-10-CM | POA: Diagnosis not present

## 2024-02-11 DIAGNOSIS — M25562 Pain in left knee: Secondary | ICD-10-CM | POA: Insufficient documentation

## 2024-02-11 DIAGNOSIS — M7989 Other specified soft tissue disorders: Secondary | ICD-10-CM | POA: Diagnosis not present

## 2024-02-11 DIAGNOSIS — M2012 Hallux valgus (acquired), left foot: Secondary | ICD-10-CM | POA: Diagnosis not present

## 2024-02-11 NOTE — ED Provider Notes (Signed)
 [facility]RG[facility]  Note:  This document was prepared using Conservation officer, historic buildings and may include unintentional dictation errors.  MRN: [id number] DOB: [birth year]  Subjective:   [name] is a 78 y.o. female with CKD 5 presenting for suffering a fall this morning.  Patient missed a step, fell onto her knee and turned to her left foot.  Has since had pain, difficulty bearing weight and walking.  Left foot pain with swelling, bruising.  Reports pain is 10 out of 10.  No current facility-administered medications for this encounter.  Current Outpatient Medications:    acetaminophen-codeine (TYLENOL #3) 300-30 MG tablet, Take 1 tablet by mouth every 6 (six) hours as needed., Disp: 28 tablet, Rfl: 2   amLODipine (NORVASC) 10 MG tablet, Take 1 tablet (10 mg total) by mouth daily., Disp: 90 tablet, Rfl: 1   aspirin EC 81 MG tablet, Take 1 tablet (81 mg total) by mouth daily. Swallow whole., Disp: 30 tablet, Rfl: 12   calcitRIOL (ROCALTROL) 0.25 MCG capsule, Take 1 capsule (0.25 mcg total) by mouth daily., Disp: 30 capsule, Rfl: 0   carvedilol (COREG) 25 MG tablet, Take 1 tablet (25 mg total) by mouth 2 (two) times daily with a meal., Disp: 180 tablet, Rfl: 1   cilostazol (PLETAL) 100 MG tablet, TAKE 1 TABLET BY MOUTH TWICE A DAY (Patient taking differently: Take 100 mg by mouth 2 (two) times daily.), Disp: 180 tablet, Rfl: 3   cloNIDine (CATAPRES - DOSED IN MG/24 HR) 0.1 mg/24hr patch, PLACE 1 PATCH (0.1 MG TOTAL) ONTO THE SKIN ONCE A WEEK., Disp: 4 patch, Rfl: 0   cloNIDine (CATAPRES) 0.2 MG tablet, TAKE 1 TABLET(0.2 MG) BY MOUTH TWICE DAILY, Disp: 180 tablet, Rfl: 1   cyclobenzaprine (FLEXERIL) 5 MG tablet, Take 1 tablet (5 mg total) by mouth at bedtime., Disp: 90 tablet, Rfl: 1   DULoxetine (CYMBALTA) 30 MG capsule, Take 1 capsule (30 mg total) by mouth daily., Disp: 90 capsule, Rfl: 3   ELIQUIS 2.5 MG TABS tablet, TAKE 1 TABLET BY MOUTH TWICE A DAY, Disp: 180  tablet, Rfl: 3   guaiFENesin (MUCINEX) 600 MG 12 hr tablet, Take 2 tablets (1,200 mg total) by mouth 2 (two) times daily., Disp: 30 tablet, Rfl: 0   hydrALAZINE (APRESOLINE) 100 MG tablet, Take 1 tablet (100 mg total) by mouth 3 (three) times daily., Disp: 90 tablet, Rfl: 0   levothyroxine (SYNTHROID) 50 MCG tablet, Take 1 tablet (50 mcg total) by mouth daily before breakfast., Disp: 30 tablet, Rfl: 0   LORazepam (ATIVAN) 1 MG tablet, TAKE 1 TABLET BY MOUTH TWICE A DAY AS NEEDED FOR ANXIETY, Disp: 60 tablet, Rfl: 2   mirtazapine (REMERON) 15 MG tablet, TAKE 1 TABLET BY MOUTH EVERYDAY AT BEDTIME, Disp: 90 tablet, Rfl: 1   nitroGLYCERIN (NITROSTAT) 0.4 MG SL tablet, Place 1 tablet (0.4 mg total) under the tongue every 5 (five) minutes x 3 doses as needed for chest pain., Disp: 10 tablet, Rfl: 1   omeprazole (PRILOSEC) 40 MG capsule, Take 1 capsule (40 mg total) by mouth daily., Disp: 90 capsule, Rfl: 3   ondansetron (ZOFRAN) 40 MG/20ML SOLN injection, Inject 2 mL intramuscularly every 6 hours as needed for nausea or vomiting, Disp: 40 mL, Rfl: 3   ondansetron (ZOFRAN-ODT) 8 MG disintegrating tablet, Take 1 tablet (8 mg total) by mouth every 8 (eight) hours as needed for nausea or vomiting., Disp: 20 tablet, Rfl: 2   pantoprazole (PROTONIX) 40 MG  tablet, TAKE 1 TABLET BY MOUTH EVERY DAY, Disp: 90 tablet, Rfl: 0   rosuvastatin (CRESTOR) 20 MG tablet, TAKE 1 TABLET BY MOUTH EVERY DAY, Disp: 90 tablet, Rfl: 1   sodium bicarbonate 650 MG tablet, Take 1 tablet (650 mg total) by mouth 3 (three) times daily., Disp: 90 tablet, Rfl: 0   SYRINGE-NEEDLE, DISP, 3 ML (B-D INTEGRA SYRINGE) 23G X 1" 3 ML MISC, Use with zofran injections, Disp: 50 each, Rfl: 3   torsemide (DEMADEX) 20 MG tablet, Take 2 tablets (40 mg total) by mouth 2 (two) times daily., Disp: 120 tablet, Rfl: 0   VELTASSA 8.4 g packet, Take 8.4 g by mouth See admin instructions. T, Th, Sat, Disp: , Rfl:    Allergies  Allergen Reactions   Xyzal  [Levocetirizine Dihydrochloride] Itching   Augmentin [Amoxicillin-Pot Clavulanate] Diarrhea and Nausea And Vomiting   Tribenzor [Olmesartan-Amlodipine-Hctz] Other (See Comments)    "Hurt the kidneys"    Past Medical History:  Diagnosis Date   Anemia    Anxiety    takes Ativan prn   Blood transfusion without reported diagnosis 09/15/12   2 units Prbc's   Broken ribs    Chronic back pain    CKD (chronic kidney disease), stage IV (HCC)    Constipation    related to pain meds   COPD (chronic obstructive pulmonary disease) (HCC) 08/10/2012   denies   Depression    Gastrostomy in place [facility])    removed   GERD (gastroesophageal reflux disease)    takes Zantac daily   Headache(784.0)    Hiatal hernia 08/10/2012   History of radiation therapy 10/17/12-11/25/12   supraglottic larynx,high risk neck tumor bed 5880 cGy/28 sessions, high risk lymph node tumor bed 5600 cGy/20 sessions, mod risk lymph node tumor bed 5040 cGy/20 sessions   Hypercholesteremia    takes Pravastatin daily   Hypertension    takes Tribenzor and Atenolol daily   Insomnia    takes Amitriptyline daily   Nausea    takes Zofran prn   PAD (peripheral artery disease) (HCC)    noninvasive imaging in 2016   Pneumonia    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 08/08/2012   Shortness of breath dyspnea    Stroke [facility]) 2011   denies. no residual   Uterine cancer (HCC)      Past Surgical History:  Procedure Laterality Date   ABDOMINAL SURGERY     r/t uterine carcinoma   APPENDECTOMY     DIRECT LARYNGOSCOPY N/A 05/22/2014   Procedure: DIRECT LARYNGOSCOPY WITH ESOPHAGEAL DILATION;  Surgeon: Osborn Coho, MD;  Location: [facility] OR;  Service: ENT;  Laterality: N/A;   ESOPHAGOSCOPY WITH DILITATION N/A 05/29/2015   Procedure: ESOPHAGOSCOPY WITH ESOPHAGEAL DILITATION;  Surgeon: Osborn Coho, MD;  Location: [facility] OR;  Service: ENT;  Laterality: N/A;   Gastrostomy Tube removed   2013   GASTROSTOMY W/ FEEDING TUBE  13    HERNIA REPAIR     child   LARYNGETOMY  08/31/2012   Procedure: LARYNGECTOMY;  Surgeon: Osborn Coho, MD;  Location: [facility] OR;  Service: ENT;  Laterality: N/A;   LARYNGOSCOPY  08/10/2012   Procedure: LARYNGOSCOPY;  Surgeon: Osborn Coho, MD;  Location: [facility] ORS;  Service: ENT;  Laterality: N/A;  with biopsy   RADICAL NECK DISSECTION  08/31/2012   Procedure: RADICAL NECK DISSECTION;  Surgeon: Osborn Coho, MD;  Location: [facility] OR;  Service: ENT;  Laterality: Bilateral;   TRACHEAL ESOPHOGEAL PUNCTURE WITH REPAIR STOMA N/A 09/08/2013  Procedure: TRACHEAL ESOPHOGEAL PUNCTURE WITH PLACEMENT OF  PROVOX PROSTHESIS ;  Surgeon: Osborn Coho, MD;  Location: [facility] OR;  Service: ENT;  Laterality: N/A;   TRACHEOSTOMY TUBE PLACEMENT  08/10/2012   Procedure: TRACHEOSTOMY;  Surgeon: Osborn Coho, MD;  Location: [facility] ORS;  Service: ENT;  Laterality: N/A;    Family History  Problem Relation Age of Onset   Throat cancer Father    Brain cancer Brother    CAD Neg Hx     Social History   Tobacco Use   Smoking status: Former    Current packs/day: 0.00    Average packs/day: 0.3 packs/day for 50.0 years (12.5 ttl pk-yrs)    Types: Cigarettes    Start date: 05/28/1963    Quit date: 05/27/2013    Years since quitting: 10.7    Passive exposure: Past   Smokeless tobacco: Never  Vaping Use   Vaping status: Never Used  Substance Use Topics   Alcohol use: Not Currently   Drug use: No    ROS   Objective:   Vitals: BP (!) 128/54 (BP Location: Left Arm)   Pulse 78   Temp 98.3 F (36.8 C) (Oral)   Resp 16   SpO2 95%   Physical Exam Constitutional:      General: She is not in acute distress.    Appearance: Normal appearance. She is well-developed. She is not ill-appearing, toxic-appearing or diaphoretic.  HENT:     Head: Normocephalic and atraumatic.     Nose: Nose normal.     Mouth/Throat:     Mouth: Mucous membranes are moist.  Eyes:     General: No scleral icterus.       Right eye: No  discharge.        Left eye: No discharge.     Extraocular Movements: Extraocular movements intact.  Cardiovascular:     Rate and Rhythm: Normal rate.  Pulmonary:     Effort: Pulmonary effort is normal.  Musculoskeletal:     Left knee: Bony tenderness present. No swelling, deformity, effusion, erythema, ecchymosis, lacerations or crepitus. Decreased range of motion. Tenderness present over the medial joint line, lateral joint line and patellar tendon. Normal alignment and normal patellar mobility.     Left lower leg: No swelling, deformity, lacerations, tenderness or bony tenderness. No edema.     Left ankle: No swelling, deformity, ecchymosis or lacerations. No tenderness. Normal range of motion.     Left Achilles Tendon: No tenderness or defects. Thompson's test negative.     Left foot: Decreased range of motion. Normal capillary refill. Swelling, tenderness and bony tenderness present. No crepitus.     Comments: Extensive guarding of the left knee and foot.  Has ecchymosis over the dorsal lateral aspect, 1+ swelling of the left foot.  Skin:    General: Skin is warm and dry.  Neurological:     General: No focal deficit present.     Mental Status: She is alert and oriented to person, place, and time.  Psychiatric:        Mood and Affect: Mood normal.        Behavior: Behavior normal.    Patient provided with a postop shoe and a walker.  Assessment and Plan :   PDMP not reviewed this encounter.  1. Left foot pain   2. Acute pain of left knee   3. Contusion of left foot, initial encounter     Will pursue outpatient imaging, x-ray order placed.  Recommended Tylenol for  pain relief.  Use walker and postop shoe as above.  Will update treatment plan likely tomorrow as patient is coming in late at closing despite suffering the injury in the morning.  Counseled patient on potential for adverse effects with medications prescribed/recommended today, ER and return-to-clinic precautions  discussed, patient verbalized understanding.    Wallis Bamberg, [place] 02/11/24 1850

## 2024-02-11 NOTE — Discharge Instructions (Addendum)
 I have placed orders to have an x-ray done at the med center in [facility].  Please check in through the emergency room but do not register to be seen as a patient.  They will contact the radiology department and a staff member will take you to go get the x-ray done.   Wear the post-op shoe until advised otherwise.

## 2024-02-11 NOTE — ED Triage Notes (Signed)
 Pt states she fell this morning c/o pain from her left knee to her foot.  Left foot swollen and bruised.

## 2024-02-14 ENCOUNTER — Encounter (HOSPITAL_COMMUNITY): Payer: Self-pay

## 2024-02-15 ENCOUNTER — Telehealth: Payer: Self-pay

## 2024-02-15 NOTE — Telephone Encounter (Signed)
 This has been done and I have informed patient about her virtual and the daughter in-law

## 2024-02-16 ENCOUNTER — Encounter (HOSPITAL_COMMUNITY): Payer: Medicare Other

## 2024-02-17 ENCOUNTER — Encounter: Payer: Self-pay | Admitting: Internal Medicine

## 2024-02-17 ENCOUNTER — Other Ambulatory Visit: Payer: Self-pay | Admitting: Internal Medicine

## 2024-02-17 ENCOUNTER — Telehealth: Admitting: Internal Medicine

## 2024-02-17 DIAGNOSIS — N185 Chronic kidney disease, stage 5: Secondary | ICD-10-CM

## 2024-02-17 DIAGNOSIS — I5032 Chronic diastolic (congestive) heart failure: Secondary | ICD-10-CM | POA: Diagnosis not present

## 2024-02-17 DIAGNOSIS — E038 Other specified hypothyroidism: Secondary | ICD-10-CM

## 2024-02-17 DIAGNOSIS — R112 Nausea with vomiting, unspecified: Secondary | ICD-10-CM | POA: Diagnosis not present

## 2024-02-17 DIAGNOSIS — E440 Moderate protein-calorie malnutrition: Secondary | ICD-10-CM | POA: Diagnosis not present

## 2024-02-17 DIAGNOSIS — E785 Hyperlipidemia, unspecified: Secondary | ICD-10-CM

## 2024-02-17 DIAGNOSIS — Z9889 Other specified postprocedural states: Secondary | ICD-10-CM

## 2024-02-17 DIAGNOSIS — I739 Peripheral vascular disease, unspecified: Secondary | ICD-10-CM | POA: Diagnosis not present

## 2024-02-17 DIAGNOSIS — M79672 Pain in left foot: Secondary | ICD-10-CM

## 2024-02-17 DIAGNOSIS — I25112 Atherosclerotic heart disease of native coronary artery with refractory angina pectoris: Secondary | ICD-10-CM | POA: Diagnosis not present

## 2024-02-17 MED ORDER — SODIUM BICARBONATE 650 MG PO TABS: 650.0000 mg | ORAL_TABLET | Freq: Three times a day (TID) | ORAL | 3 refills | Status: DC

## 2024-02-17 MED ORDER — CALCITRIOL 0.25 MCG PO CAPS: 0.2500 ug | ORAL_CAPSULE | Freq: Every day | ORAL | 3 refills | Status: DC

## 2024-02-17 MED ORDER — CLONIDINE 0.1 MG/24HR TD PTWK: 0.1000 mg | MEDICATED_PATCH | TRANSDERMAL | 11 refills | Status: DC

## 2024-02-17 MED ORDER — OMEPRAZOLE 40 MG PO CPDR: 40.0000 mg | DELAYED_RELEASE_CAPSULE | Freq: Every day | ORAL | 3 refills | Status: DC

## 2024-02-17 MED ORDER — HYDRALAZINE HCL 100 MG PO TABS: 100.0000 mg | ORAL_TABLET | Freq: Three times a day (TID) | ORAL | 3 refills | Status: DC

## 2024-02-17 MED ORDER — LEVOFLOXACIN 250 MG PO TABS: 250.0000 mg | ORAL_TABLET | ORAL | 0 refills | Status: DC

## 2024-02-17 MED ORDER — CARVEDILOL 25 MG PO TABS: 25.0000 mg | ORAL_TABLET | Freq: Two times a day (BID) | ORAL | 3 refills | Status: DC

## 2024-02-17 MED ORDER — MIRTAZAPINE 15 MG PO TABS
15.0000 mg | ORAL_TABLET | Freq: Every day | ORAL | 3 refills | Status: DC
Start: 2024-02-17 — End: 2024-02-25

## 2024-02-17 MED ORDER — TRAMADOL HCL 50 MG PO TABS: 50.0000 mg | ORAL_TABLET | Freq: Four times a day (QID) | ORAL | 0 refills | Status: DC | PRN

## 2024-02-17 MED ORDER — PANTOPRAZOLE SODIUM 40 MG PO TBEC: 40.0000 mg | DELAYED_RELEASE_TABLET | Freq: Every day | ORAL | 3 refills | Status: DC

## 2024-02-17 MED ORDER — ROSUVASTATIN CALCIUM 20 MG PO TABS: 20.0000 mg | ORAL_TABLET | Freq: Every day | ORAL | 3 refills | Status: DC

## 2024-02-17 MED ORDER — APIXABAN 2.5 MG PO TABS: 2.5000 mg | ORAL_TABLET | Freq: Two times a day (BID) | ORAL | 3 refills | Status: DC

## 2024-02-17 MED ORDER — LEVOTHYROXINE SODIUM 50 MCG PO TABS: 50.0000 ug | ORAL_TABLET | Freq: Every day | ORAL | 3 refills | Status: DC

## 2024-02-17 MED ORDER — AMLODIPINE BESYLATE 10 MG PO TABS: 10.0000 mg | ORAL_TABLET | Freq: Every day | ORAL | 3 refills | Status: DC

## 2024-02-17 MED ORDER — DULOXETINE HCL 30 MG PO CPEP: 30.0000 mg | ORAL_CAPSULE | Freq: Every day | ORAL | 3 refills | Status: DC

## 2024-02-17 MED ORDER — CILOSTAZOL 100 MG PO TABS: 100.0000 mg | ORAL_TABLET | Freq: Two times a day (BID) | ORAL | 3 refills | Status: DC

## 2024-02-17 MED ORDER — LORAZEPAM 1 MG PO TABS: 1.0000 mg | ORAL_TABLET | Freq: Two times a day (BID) | ORAL | 2 refills | Status: DC

## 2024-02-17 MED ORDER — TORSEMIDE 20 MG PO TABS: 40.0000 mg | ORAL_TABLET | Freq: Two times a day (BID) | ORAL | 3 refills | Status: DC

## 2024-02-17 MED ORDER — ONDANSETRON 8 MG PO TBDP: 8.0000 mg | ORAL_TABLET | Freq: Three times a day (TID) | ORAL | 2 refills | Status: DC | PRN

## 2024-02-17 NOTE — Assessment & Plan Note (Signed)
 No clear flare today. She does have CKD stage 5 and some degree of chronic pleural fluid. Legs examined and no swelling. Taking torsemide 40 mg BID.

## 2024-02-17 NOTE — Assessment & Plan Note (Signed)
 Overall stable on current meds and uses pain medication and nitroglycerin prn.

## 2024-02-17 NOTE — Assessment & Plan Note (Signed)
 Due to recent fall. Visit to urgent care and x-ray without fracture. Using tramadol for pain which is effective q 6 hours. Given comfort care is our goal for her treatment rx tramadol for pain. Discussed with limited renal function and vascular disease this wound will likely not heal or take significant time to heal. No clear infection today.

## 2024-02-17 NOTE — Progress Notes (Signed)
 Virtual Visit via Video Note  I connected with [name] on 02/17/24 at  8:40 AM EST by a video enabled telemedicine application and verified that I am speaking with the correct person using two identifiers.  The patient and the provider were at separate locations throughout the entire encounter. Patient location: home, Provider location: work   I discussed the limitations of evaluation and management by telemedicine and the availability of in person appointments. The patient expressed understanding and agreed to proceed. The patient and the provider were the only parties present for the visit unless noted in HPI below.  History of Present Illness: The patient is a 78 y.o. female with visit for follow up conditions and recent fall with visit to urgent care and x-rays done. Having thicker secretions and green in color from trach which are hard to expel. Daughter in law provides history.   Observations/Objective: Appearance: chronically ill, muscle wasting in the legs and arms, breathing appears stable, hoarse whisper to daughter in law who can understand her, casual grooming  Assessment and Plan: See problem oriented charting  Follow Up Instructions: rx tramadol for pain, levaquin for possible infection in lungs, continue with goal of comfort care for patient  I discussed the assessment and treatment plan with the patient. The patient was provided an opportunity to ask questions and all were answered. The patient agreed with the plan and demonstrated an understanding of the instructions.   The patient was advised to call back or seek an in-person evaluation if the symptoms worsen or if the condition fails to improve as anticipated.  Myrlene Broker, MD

## 2024-02-17 NOTE — Assessment & Plan Note (Signed)
 Worsening secretions and thicker and green. Given comfort care is our priority rx levaquin 250 mg q 48 hours (renally dosed) to address possible infection.

## 2024-02-17 NOTE — Assessment & Plan Note (Signed)
 Refill levothyroxine and will defer labs given comfort care goals.

## 2024-02-17 NOTE — Assessment & Plan Note (Signed)
 Chronic and muscle wasting apparent on exam. She does have protein wasting in urine still making some urine.

## 2024-02-17 NOTE — Assessment & Plan Note (Signed)
 Defer labs today. She would not want dialysis and we are treating with comfort care in mind.

## 2024-02-18 ENCOUNTER — Encounter (HOSPITAL_COMMUNITY)

## 2024-02-25 ENCOUNTER — Ambulatory Visit: Admitting: Internal Medicine

## 2024-02-25 ENCOUNTER — Encounter: Payer: Self-pay | Admitting: Internal Medicine

## 2024-02-25 ENCOUNTER — Ambulatory Visit

## 2024-02-25 ENCOUNTER — Encounter (HOSPITAL_COMMUNITY)

## 2024-02-25 VITALS — BP 126/68 | HR 64 | Temp 98.1°F | Ht 60.0 in

## 2024-02-25 DIAGNOSIS — E440 Moderate protein-calorie malnutrition: Secondary | ICD-10-CM

## 2024-02-25 DIAGNOSIS — N185 Chronic kidney disease, stage 5: Secondary | ICD-10-CM

## 2024-02-25 DIAGNOSIS — M79672 Pain in left foot: Secondary | ICD-10-CM

## 2024-02-25 DIAGNOSIS — I739 Peripheral vascular disease, unspecified: Secondary | ICD-10-CM | POA: Diagnosis not present

## 2024-02-25 DIAGNOSIS — F331 Major depressive disorder, recurrent, moderate: Secondary | ICD-10-CM

## 2024-02-25 MED ORDER — DOXYCYCLINE HYCLATE 100 MG PO TABS: 100.0000 mg | ORAL_TABLET | Freq: Two times a day (BID) | ORAL | 0 refills | Status: DC

## 2024-02-25 MED ORDER — MIRTAZAPINE 30 MG PO TABS: 30.0000 mg | ORAL_TABLET | Freq: Every day | ORAL | 1 refills | Status: DC

## 2024-02-25 NOTE — Assessment & Plan Note (Signed)
 We discussed again about the wound on the left foot is unlikely to heal given vascular status and she is not a candidate for intervention.

## 2024-02-25 NOTE — Assessment & Plan Note (Signed)
 With notable muscle wasting on exam. We discussed poor appetite and protein wasting from kidneys contribute to this and will not likely improve.

## 2024-02-25 NOTE — Assessment & Plan Note (Signed)
 Increasing remeron to 30 mg at bedtime to see if this helps with appetite but likely renal function is contributing to poor appetite more.

## 2024-02-25 NOTE — Assessment & Plan Note (Signed)
 Talked to patient and daughter in law that her health will likely continue to decline as her renal function is poor. No labs ordered today as dialysis is not an option for her and will not change management.

## 2024-02-25 NOTE — Assessment & Plan Note (Signed)
 Checking x-ray for fracture for ongoing severe pain. If negative likely pain is coming from ischemia in the area.

## 2024-02-25 NOTE — Patient Instructions (Signed)
 We will do an antibiotic doxycycline to take 1 pill twice a day for 1 week for the throat and the foot.  We will x-ray the foot.  We will increase the mirtazapine (remeron) to 30 mg at night time to help appetite. You can take 2 pills of what you have until gone. I have sent in a prescription for the stronger dose.

## 2024-02-25 NOTE — Progress Notes (Signed)
   Subjective:   Patient ID: [name], female    DOB: [birth year], 78 y.o.   MRN: [id number]  HPI The patient is a 78 YO female coming in for left foot pain and redness after fall. Also having thick secretions in the trach area. Denies lung congestion. Some SOB due to being unable to clear secretions. Just started water nebulization recently and this did help a little. Pain in the foot. Using tramadol for pain effectively but this makes her drowsy and she has fallen 3 times since using this. Urinating about 1-2 times per day. Not eating much. Losing weight and muscle.   Review of Systems  Constitutional:  Positive for activity change, appetite change, fatigue and unexpected weight change.  HENT: Negative.    Eyes: Negative.   Respiratory:  Positive for cough and shortness of breath. Negative for chest tightness.   Cardiovascular:  Negative for chest pain, palpitations and leg swelling.  Gastrointestinal:  Negative for abdominal distention, abdominal pain, constipation, diarrhea, nausea and vomiting.  Musculoskeletal:  Positive for arthralgias and myalgias.  Skin:  Positive for wound.  Neurological: Negative.   Psychiatric/Behavioral: Negative.      Objective:  Physical Exam Constitutional:      Appearance: She is well-developed.     Comments: Muscle wasting evident throughout  HENT:     Head: Normocephalic and atraumatic.  Cardiovascular:     Rate and Rhythm: Normal rate and regular rhythm.  Pulmonary:     Effort: Pulmonary effort is normal. No respiratory distress.     Breath sounds: Rhonchi present. No wheezing or rales.     Comments: Stable lung exam, trach with secretions  Abdominal:     General: Bowel sounds are normal. There is no distension.     Palpations: Abdomen is soft.     Tenderness: There is no abdominal tenderness. There is no rebound.  Musculoskeletal:     Cervical back: Normal range of motion.  Skin:    General: Skin is warm and dry.     Comments:  Wound lateral left foot with tenderness and oozing from a corner, eschar present unable to stage, no palpable pulses either foot  Neurological:     Mental Status: She is alert and oriented to person, place, and time.     Sensory: Sensory deficit present.     Motor: Weakness present.     Coordination: Coordination abnormal.     Vitals:   02/25/24 1153  BP: 126/68  Pulse: 64  Temp: 98.1 F (36.7 C)  TempSrc: Oral  SpO2: 97%  Height: 5' (1.524 m)    Assessment & Plan:  Visit time 20 minutes in face to face communication with patient and coordination of care, additional 15 minutes spent in record review, coordination or care, ordering tests, communicating/referring to other healthcare professionals, documenting in medical records all on the same day of the visit for total time 35 minutes spent on the visit.

## 2024-03-10 ENCOUNTER — Encounter (HOSPITAL_COMMUNITY)

## 2024-03-14 ENCOUNTER — Encounter: Payer: Self-pay | Admitting: Internal Medicine

## 2024-03-26 ENCOUNTER — Emergency Department (HOSPITAL_COMMUNITY)

## 2024-03-26 ENCOUNTER — Other Ambulatory Visit: Payer: Self-pay | Admitting: Internal Medicine

## 2024-03-26 ENCOUNTER — Inpatient Hospital Stay (HOSPITAL_COMMUNITY)
Admission: EM | Admit: 2024-03-26 | Discharge: 2024-03-29 | DRG: 292 | Disposition: A | Discharge status: Hospice | Attending: Internal Medicine | Admitting: Internal Medicine

## 2024-03-26 ENCOUNTER — Other Ambulatory Visit: Payer: Self-pay

## 2024-03-26 DIAGNOSIS — R627 Adult failure to thrive: Principal | ICD-10-CM | POA: Diagnosis present

## 2024-03-26 DIAGNOSIS — Z923 Personal history of irradiation: Secondary | ICD-10-CM | POA: Diagnosis not present

## 2024-03-26 DIAGNOSIS — I959 Hypotension, unspecified: Secondary | ICD-10-CM | POA: Diagnosis not present

## 2024-03-26 DIAGNOSIS — I132 Hypertensive heart and chronic kidney disease with heart failure and with stage 5 chronic kidney disease, or end stage renal disease: Principal | ICD-10-CM | POA: Diagnosis present

## 2024-03-26 DIAGNOSIS — E875 Hyperkalemia: Secondary | ICD-10-CM | POA: Diagnosis present

## 2024-03-26 DIAGNOSIS — I6523 Occlusion and stenosis of bilateral carotid arteries: Secondary | ICD-10-CM | POA: Diagnosis not present

## 2024-03-26 DIAGNOSIS — I25112 Atherosclerotic heart disease of native coronary artery with refractory angina pectoris: Secondary | ICD-10-CM | POA: Diagnosis not present

## 2024-03-26 DIAGNOSIS — I739 Peripheral vascular disease, unspecified: Secondary | ICD-10-CM | POA: Diagnosis not present

## 2024-03-26 DIAGNOSIS — Z87891 Personal history of nicotine dependence: Secondary | ICD-10-CM

## 2024-03-26 DIAGNOSIS — Z9181 History of falling: Secondary | ICD-10-CM | POA: Diagnosis not present

## 2024-03-26 DIAGNOSIS — Z79890 Hormone replacement therapy (postmenopausal): Secondary | ICD-10-CM

## 2024-03-26 DIAGNOSIS — J9503 Malfunction of tracheostomy stoma: Secondary | ICD-10-CM | POA: Diagnosis not present

## 2024-03-26 DIAGNOSIS — I2510 Atherosclerotic heart disease of native coronary artery without angina pectoris: Secondary | ICD-10-CM | POA: Diagnosis not present

## 2024-03-26 DIAGNOSIS — R636 Underweight: Secondary | ICD-10-CM | POA: Diagnosis not present

## 2024-03-26 DIAGNOSIS — E039 Hypothyroidism, unspecified: Secondary | ICD-10-CM | POA: Diagnosis present

## 2024-03-26 DIAGNOSIS — Z91158 Patient's noncompliance with renal dialysis for other reason: Secondary | ICD-10-CM

## 2024-03-26 DIAGNOSIS — R569 Unspecified convulsions: Secondary | ICD-10-CM | POA: Diagnosis not present

## 2024-03-26 DIAGNOSIS — I252 Old myocardial infarction: Secondary | ICD-10-CM | POA: Diagnosis not present

## 2024-03-26 DIAGNOSIS — Z515 Encounter for palliative care: Secondary | ICD-10-CM

## 2024-03-26 DIAGNOSIS — N185 Chronic kidney disease, stage 5: Secondary | ICD-10-CM | POA: Diagnosis present

## 2024-03-26 DIAGNOSIS — Z808 Family history of malignant neoplasm of other organs or systems: Secondary | ICD-10-CM

## 2024-03-26 DIAGNOSIS — F418 Other specified anxiety disorders: Secondary | ICD-10-CM | POA: Diagnosis present

## 2024-03-26 DIAGNOSIS — I5032 Chronic diastolic (congestive) heart failure: Secondary | ICD-10-CM | POA: Diagnosis not present

## 2024-03-26 DIAGNOSIS — Z8542 Personal history of malignant neoplasm of other parts of uterus: Secondary | ICD-10-CM | POA: Diagnosis not present

## 2024-03-26 DIAGNOSIS — Z7189 Other specified counseling: Secondary | ICD-10-CM | POA: Diagnosis not present

## 2024-03-26 DIAGNOSIS — D649 Anemia, unspecified: Secondary | ICD-10-CM | POA: Diagnosis not present

## 2024-03-26 DIAGNOSIS — E872 Acidosis, unspecified: Secondary | ICD-10-CM | POA: Diagnosis present

## 2024-03-26 DIAGNOSIS — F419 Anxiety disorder, unspecified: Secondary | ICD-10-CM | POA: Diagnosis not present

## 2024-03-26 DIAGNOSIS — Z681 Body mass index (BMI) 19 or less, adult: Secondary | ICD-10-CM

## 2024-03-26 DIAGNOSIS — Z79899 Other long term (current) drug therapy: Secondary | ICD-10-CM

## 2024-03-26 DIAGNOSIS — Z789 Other specified health status: Secondary | ICD-10-CM | POA: Diagnosis not present

## 2024-03-26 DIAGNOSIS — R112 Nausea with vomiting, unspecified: Secondary | ICD-10-CM

## 2024-03-26 DIAGNOSIS — Z9002 Acquired absence of larynx: Secondary | ICD-10-CM

## 2024-03-26 DIAGNOSIS — E7800 Pure hypercholesterolemia, unspecified: Secondary | ICD-10-CM | POA: Diagnosis not present

## 2024-03-26 DIAGNOSIS — D630 Anemia in neoplastic disease: Secondary | ICD-10-CM | POA: Diagnosis present

## 2024-03-26 DIAGNOSIS — R519 Headache, unspecified: Secondary | ICD-10-CM | POA: Diagnosis not present

## 2024-03-26 DIAGNOSIS — F32A Depression, unspecified: Secondary | ICD-10-CM | POA: Diagnosis not present

## 2024-03-26 DIAGNOSIS — I10 Essential (primary) hypertension: Secondary | ICD-10-CM | POA: Diagnosis not present

## 2024-03-26 DIAGNOSIS — F411 Generalized anxiety disorder: Secondary | ICD-10-CM | POA: Diagnosis not present

## 2024-03-26 DIAGNOSIS — I480 Paroxysmal atrial fibrillation: Secondary | ICD-10-CM | POA: Diagnosis not present

## 2024-03-26 DIAGNOSIS — S91302S Unspecified open wound, left foot, sequela: Secondary | ICD-10-CM | POA: Diagnosis not present

## 2024-03-26 DIAGNOSIS — N186 End stage renal disease: Secondary | ICD-10-CM | POA: Diagnosis not present

## 2024-03-26 DIAGNOSIS — Z7901 Long term (current) use of anticoagulants: Secondary | ICD-10-CM

## 2024-03-26 DIAGNOSIS — Z7982 Long term (current) use of aspirin: Secondary | ICD-10-CM

## 2024-03-26 DIAGNOSIS — I700 Atherosclerosis of aorta: Secondary | ICD-10-CM | POA: Diagnosis not present

## 2024-03-26 DIAGNOSIS — I2489 Other forms of acute ischemic heart disease: Secondary | ICD-10-CM | POA: Diagnosis not present

## 2024-03-26 DIAGNOSIS — R000 Tachycardia, unspecified: Secondary | ICD-10-CM | POA: Diagnosis not present

## 2024-03-26 DIAGNOSIS — Z8673 Personal history of transient ischemic attack (TIA), and cerebral infarction without residual deficits: Secondary | ICD-10-CM

## 2024-03-26 DIAGNOSIS — Z930 Tracheostomy status: Secondary | ICD-10-CM | POA: Diagnosis not present

## 2024-03-26 DIAGNOSIS — Z66 Do not resuscitate: Secondary | ICD-10-CM | POA: Diagnosis not present

## 2024-03-26 DIAGNOSIS — Z85818 Personal history of malignant neoplasm of other sites of lip, oral cavity, and pharynx: Secondary | ICD-10-CM

## 2024-03-26 DIAGNOSIS — K219 Gastro-esophageal reflux disease without esophagitis: Secondary | ICD-10-CM | POA: Diagnosis present

## 2024-03-26 DIAGNOSIS — R0602 Shortness of breath: Secondary | ICD-10-CM | POA: Diagnosis not present

## 2024-03-26 DIAGNOSIS — Z7401 Bed confinement status: Secondary | ICD-10-CM | POA: Diagnosis not present

## 2024-03-26 DIAGNOSIS — Z7902 Long term (current) use of antithrombotics/antiplatelets: Secondary | ICD-10-CM

## 2024-03-26 DIAGNOSIS — Z8521 Personal history of malignant neoplasm of larynx: Secondary | ICD-10-CM

## 2024-03-26 DIAGNOSIS — Z888 Allergy status to other drugs, medicaments and biological substances status: Secondary | ICD-10-CM

## 2024-03-26 DIAGNOSIS — D638 Anemia in other chronic diseases classified elsewhere: Secondary | ICD-10-CM | POA: Diagnosis not present

## 2024-03-26 DIAGNOSIS — R001 Bradycardia, unspecified: Secondary | ICD-10-CM | POA: Diagnosis not present

## 2024-03-26 DIAGNOSIS — D631 Anemia in chronic kidney disease: Secondary | ICD-10-CM | POA: Diagnosis not present

## 2024-03-26 LAB — CBC
HCT: 20.5 % — ABNORMAL LOW (ref 36.0–46.0)
Hemoglobin: 6.6 g/dL — CL (ref 12.0–15.0)
MCH: 27 pg (ref 26.0–34.0)
MCHC: 32.2 g/dL (ref 30.0–36.0)
MCV: 84 fL (ref 80.0–100.0)
Platelets: 223 10*3/uL (ref 150–400)
RBC: 2.44 MIL/uL — ABNORMAL LOW (ref 3.87–5.11)
RDW: 18.2 % — ABNORMAL HIGH (ref 11.5–15.5)
WBC: 7.8 10*3/uL (ref 4.0–10.5)
nRBC: 0 % (ref 0.0–0.2)

## 2024-03-26 LAB — URINALYSIS, ROUTINE W REFLEX MICROSCOPIC
Bilirubin Urine: NEGATIVE
Glucose, UA: 50 mg/dL — AB
Hgb urine dipstick: NEGATIVE
Ketones, ur: NEGATIVE mg/dL
Leukocytes,Ua: NEGATIVE
Nitrite: NEGATIVE
Protein, ur: >=300 mg/dL — AB
Specific Gravity, Urine: 1.008 (ref 1.005–1.030)
pH: 8 (ref 5.0–8.0)

## 2024-03-26 LAB — BASIC METABOLIC PANEL WITH GFR
Anion gap: 14 (ref 5–15)
Anion gap: 15 (ref 5–15)
BUN: 128 mg/dL — ABNORMAL HIGH (ref 8–23)
BUN: 122 mg/dL — ABNORMAL HIGH (ref 8–23)
CO2: 19 mmol/L — ABNORMAL LOW (ref 22–32)
CO2: 17 mmol/L — ABNORMAL LOW (ref 22–32)
Calcium: 10.1 mg/dL (ref 8.9–10.3)
Calcium: 10.2 mg/dL (ref 8.9–10.3)
Chloride: 102 mmol/L (ref 98–111)
Chloride: 106 mmol/L (ref 98–111)
Creatinine, Ser: 8.22 mg/dL — ABNORMAL HIGH (ref 0.44–1.00)
Creatinine, Ser: 7.95 mg/dL — ABNORMAL HIGH (ref 0.44–1.00)
GFR, Estimated: 5 mL/min — ABNORMAL LOW (ref >60)
GFR, Estimated: 5 mL/min — ABNORMAL LOW (ref >60)
Glucose, Bld: 98 mg/dL (ref 70–99)
Glucose, Bld: 70 mg/dL (ref 70–99)
Potassium: 6.3 mmol/L (ref 3.5–5.1)
Potassium: 5.3 mmol/L — ABNORMAL HIGH (ref 3.5–5.1)
Sodium: 135 mmol/L (ref 135–145)
Sodium: 138 mmol/L (ref 135–145)

## 2024-03-26 LAB — FOLATE: Folate: 7.6 ng/mL (ref >5.9)

## 2024-03-26 LAB — VITAMIN B12: Vitamin B-12: 1126 pg/mL — ABNORMAL HIGH (ref 180–914)

## 2024-03-26 LAB — AMMONIA: Ammonia: <10 umol/L (ref 9–35)

## 2024-03-26 LAB — TROPONIN I (HIGH SENSITIVITY)
Troponin I (High Sensitivity): 316 ng/L (ref <18)
Troponin I (High Sensitivity): 258 ng/L (ref <18)

## 2024-03-26 LAB — FERRITIN: Ferritin: 1513 ng/mL — ABNORMAL HIGH (ref 11–307)

## 2024-03-26 LAB — BRAIN NATRIURETIC PEPTIDE: B Natriuretic Peptide: 170.5 pg/mL — ABNORMAL HIGH (ref 0.0–100.0)

## 2024-03-26 LAB — IRON AND TIBC
Iron: 44 ug/dL (ref 28–170)
Saturation Ratios: 22 % (ref 10.4–31.8)
TIBC: 197 ug/dL — ABNORMAL LOW (ref 250–450)
UIBC: 153 ug/dL

## 2024-03-26 LAB — PREPARE RBC (CROSSMATCH)

## 2024-03-26 MED ORDER — HYDRALAZINE HCL 25 MG PO TABS
100.0000 mg | ORAL_TABLET | Freq: Three times a day (TID) | ORAL | Status: DC
Start: 2024-03-26 — End: 2024-03-29
  Administered 2024-03-26 – 2024-03-28 (×7): 100 mg via ORAL
  Filled 2024-03-26 (×8): qty 4

## 2024-03-26 MED ORDER — AMLODIPINE BESYLATE 5 MG PO TABS
10.0000 mg | ORAL_TABLET | Freq: Every day | ORAL | Status: DC
Start: 2024-03-27 — End: 2024-03-29
  Administered 2024-03-27 – 2024-03-28 (×2): 10 mg via ORAL
  Filled 2024-03-26: qty 1
  Filled 2024-03-26: qty 2
  Filled 2024-03-26: qty 1

## 2024-03-26 MED ORDER — CARVEDILOL 25 MG PO TABS
25.0000 mg | ORAL_TABLET | Freq: Two times a day (BID) | ORAL | Status: DC
  Administered 2024-03-27 – 2024-03-28 (×4): 25 mg via ORAL
  Filled 2024-03-26 (×4): qty 1
  Filled 2024-03-26: qty 2

## 2024-03-26 MED ORDER — ACETAMINOPHEN 325 MG PO TABS
650.0000 mg | ORAL_TABLET | Freq: Four times a day (QID) | ORAL | Status: DC | PRN
  Administered 2024-03-27 – 2024-03-29 (×2): 650 mg via ORAL
  Filled 2024-03-26 (×2): qty 2

## 2024-03-26 MED ORDER — SENNOSIDES-DOCUSATE SODIUM 8.6-50 MG PO TABS
1.0000 | ORAL_TABLET | Freq: Every evening | ORAL | Status: DC | PRN
  Filled 2024-03-26: qty 1

## 2024-03-26 MED ORDER — CYCLOBENZAPRINE HCL 5 MG PO TABS
5.0000 mg | ORAL_TABLET | Freq: Every day | ORAL | Status: DC
  Administered 2024-03-26 – 2024-03-28 (×3): 5 mg via ORAL
  Filled 2024-03-26 (×3): qty 1

## 2024-03-26 MED ORDER — ONDANSETRON HCL 4 MG/2ML IJ SOLN
4.0000 mg | Freq: Four times a day (QID) | INTRAMUSCULAR | Status: DC | PRN
  Administered 2024-03-27 – 2024-03-29 (×3): 4 mg via INTRAVENOUS
  Filled 2024-03-26 (×3): qty 2

## 2024-03-26 MED ORDER — SODIUM CHLORIDE 0.9% IV SOLUTION
Freq: Once | INTRAVENOUS | Status: AC
  Administered 2024-03-27: 10000 mL via INTRAVENOUS

## 2024-03-26 MED ORDER — DULOXETINE HCL 30 MG PO CPEP
30.0000 mg | ORAL_CAPSULE | Freq: Every day | ORAL | Status: DC
Start: 2024-03-27 — End: 2024-03-29
  Administered 2024-03-28: 30 mg via ORAL
  Filled 2024-03-26 (×2): qty 1

## 2024-03-26 MED ORDER — LACTATED RINGERS IV SOLN: INTRAVENOUS | Status: DC

## 2024-03-26 MED ORDER — LACTATED RINGERS IV SOLN: INTRAVENOUS | Status: AC

## 2024-03-26 MED ORDER — SODIUM BICARBONATE 650 MG PO TABS
650.0000 mg | ORAL_TABLET | Freq: Three times a day (TID) | ORAL | Status: DC
Start: 2024-03-26 — End: 2024-03-29
  Administered 2024-03-26 – 2024-03-28 (×7): 650 mg via ORAL
  Filled 2024-03-26 (×8): qty 1

## 2024-03-26 MED ORDER — DEXTROSE 50 % IV SOLN
1.0000 | Freq: Once | INTRAVENOUS | Status: AC
  Administered 2024-03-26: 50 mL via INTRAVENOUS
  Filled 2024-03-26: qty 50

## 2024-03-26 MED ORDER — LEVOTHYROXINE SODIUM 50 MCG PO TABS
50.0000 ug | ORAL_TABLET | Freq: Every day | ORAL | Status: DC
Start: 2024-03-27 — End: 2024-03-29
  Administered 2024-03-27 – 2024-03-29 (×3): 50 ug via ORAL
  Filled 2024-03-26 (×3): qty 1

## 2024-03-26 MED ORDER — SODIUM CHLORIDE 0.9% FLUSH
3.0000 mL | Freq: Two times a day (BID) | INTRAVENOUS | Status: DC
  Administered 2024-03-26 – 2024-03-29 (×6): 3 mL via INTRAVENOUS

## 2024-03-26 MED ORDER — CALCIUM GLUCONATE 10 % IV SOLN
1.0000 g | Freq: Once | INTRAVENOUS | Status: AC
  Administered 2024-03-26: 1 g via INTRAVENOUS
  Filled 2024-03-26: qty 10

## 2024-03-26 MED ORDER — TRAMADOL HCL 50 MG PO TABS
50.0000 mg | ORAL_TABLET | Freq: Two times a day (BID) | ORAL | Status: DC | PRN
  Administered 2024-03-26 – 2024-03-27 (×2): 50 mg via ORAL
  Filled 2024-03-26 (×2): qty 1

## 2024-03-26 MED ORDER — SODIUM CHLORIDE 0.9 % IV BOLUS
500.0000 mL | Freq: Once | INTRAVENOUS | Status: AC
  Administered 2024-03-26: 500 mL via INTRAVENOUS

## 2024-03-26 MED ORDER — PANTOPRAZOLE SODIUM 40 MG PO TBEC
40.0000 mg | DELAYED_RELEASE_TABLET | Freq: Every day | ORAL | Status: DC
  Administered 2024-03-27 – 2024-03-28 (×2): 40 mg via ORAL
  Filled 2024-03-26 (×3): qty 1

## 2024-03-26 MED ORDER — MIRTAZAPINE 15 MG PO TABS
30.0000 mg | ORAL_TABLET | Freq: Every day | ORAL | Status: DC
  Administered 2024-03-26 – 2024-03-28 (×3): 30 mg via ORAL
  Filled 2024-03-26 (×3): qty 2

## 2024-03-26 MED ORDER — NITROGLYCERIN 0.4 MG SL SUBL: 0.4000 mg | SUBLINGUAL_TABLET | SUBLINGUAL | Status: DC | PRN

## 2024-03-26 MED ORDER — INSULIN ASPART 100 UNIT/ML IV SOLN
5.0000 [IU] | Freq: Once | INTRAVENOUS | Status: AC
  Administered 2024-03-26: 5 [IU] via INTRAVENOUS

## 2024-03-26 MED ORDER — ROSUVASTATIN CALCIUM 20 MG PO TABS
20.0000 mg | ORAL_TABLET | Freq: Every day | ORAL | Status: DC
Start: 2024-03-27 — End: 2024-03-29
  Administered 2024-03-27 – 2024-03-28 (×2): 20 mg via ORAL
  Filled 2024-03-26 (×3): qty 1

## 2024-03-26 MED ORDER — SODIUM ZIRCONIUM CYCLOSILICATE 10 G PO PACK
10.0000 g | PACK | ORAL | Status: AC
  Administered 2024-03-26 – 2024-03-27 (×3): 10 g via ORAL
  Filled 2024-03-26 (×3): qty 1

## 2024-03-26 MED ORDER — BISACODYL 5 MG PO TBEC: 5.0000 mg | DELAYED_RELEASE_TABLET | Freq: Every day | ORAL | Status: DC | PRN

## 2024-03-26 MED ORDER — LORAZEPAM 1 MG PO TABS
1.0000 mg | ORAL_TABLET | Freq: Two times a day (BID) | ORAL | Status: DC
Start: 2024-03-26 — End: 2024-03-29
  Administered 2024-03-26 – 2024-03-28 (×5): 1 mg via ORAL
  Filled 2024-03-26 (×6): qty 1

## 2024-03-26 MED ORDER — SODIUM BICARBONATE 8.4 % IV SOLN
50.0000 meq | Freq: Once | INTRAVENOUS | Status: AC
  Administered 2024-03-26: 50 meq via INTRAVENOUS
  Filled 2024-03-26: qty 50

## 2024-03-26 MED ORDER — ALBUTEROL SULFATE (2.5 MG/3ML) 0.083% IN NEBU
10.0000 mg | INHALATION_SOLUTION | Freq: Once | RESPIRATORY_TRACT | Status: AC
  Administered 2024-03-26: 10 mg via RESPIRATORY_TRACT
  Filled 2024-03-26: qty 12

## 2024-03-26 MED ORDER — ACETAMINOPHEN 650 MG RE SUPP: 650.0000 mg | Freq: Four times a day (QID) | RECTAL | Status: DC | PRN

## 2024-03-26 MED ORDER — ONDANSETRON HCL 4 MG PO TABS: 4.0000 mg | ORAL_TABLET | Freq: Four times a day (QID) | ORAL | Status: DC | PRN

## 2024-03-26 MED ORDER — CLONIDINE HCL 0.2 MG PO TABS
0.2000 mg | ORAL_TABLET | Freq: Two times a day (BID) | ORAL | Status: DC
  Administered 2024-03-26 – 2024-03-27 (×3): 0.2 mg via ORAL
  Filled 2024-03-26 (×4): qty 1

## 2024-03-26 MED ORDER — SODIUM ZIRCONIUM CYCLOSILICATE 10 G PO PACK: 10.0000 g | PACK | Freq: Once | ORAL | Status: DC

## 2024-03-26 NOTE — Progress Notes (Signed)
 Orthopedic Tech Progress Note Patient Details:  [name] 1946/05/29 [id number]  Patient ID: [name], female   DOB: 1946/08/09, 78 y.o.   MRN: [id number] I attended trauma page. [name] 03/26/2024, 8:20 PM

## 2024-03-26 NOTE — ED Notes (Addendum)
 Checked on pt. Pt stable.

## 2024-03-26 NOTE — Hospital Course (Signed)
 [name] is a 78 y.o. female with medical history significant for laryngeal cancer s/p laryngectomy and decannulated tracheostomy with stoma, CKD stage V, PAF on Eliquis, chronic HFpEF, CAD, PAD, history of CVA, HTN, anemia of chronic disease, hypothyroidism, depression/anxiety who is admitted with hyperkalemia and acute on chronic anemia in setting of CKD stage V.

## 2024-03-26 NOTE — ED Notes (Signed)
 Daughter Mansfield Seip [phone] would like an update asap

## 2024-03-26 NOTE — ED Notes (Signed)
 After lab call for critical value to RN, Tee MD notfied of HGB 6.3 via conversation.

## 2024-03-26 NOTE — ED Notes (Addendum)
 Daughter [name] and son [name] gave verbal consent for blood transfusion over phone to the RN.

## 2024-03-26 NOTE — ED Notes (Signed)
 Rn called by lab about critical lab value. Tee MD notfied of k = 6.3 via conversation.

## 2024-03-26 NOTE — H&P (Signed)
 History and Physical    [name] ZOX:[id number] DOB: 1946-10-03 DOA: 03/26/2024  PCP: Adelia Homestead, MD  Patient coming from: Home  I have personally briefly reviewed patient's old medical records in [facility]  Chief Complaint: Generalized weakness, decreased oral intake, failure to thrive picture  HPI: [name] is a 78 y.o. female with medical history significant for laryngeal cancer s/p laryngectomy and decannulated tracheostomy with stoma, CKD stage V, PAF on Eliquis, chronic HFpEF, CAD, PAD, history of CVA, HTN, anemia of chronic disease, hypothyroidism, depression/anxiety who presented to the ED for evaluation of failure to thrive.  History is limited from patient as she is nonverbal but motions with her hands, remote video interpreter present.  History is supplemented by son and daughter-in-law by phone.  Patient brought from home with complaint of decreased oral intake over the past week or 2.  She had recently been complaining of headache and intermittent chest discomfort.  She has had some increased secretions from stoma she has had issues with recurrent esophageal and tracheal stoma stenosis requiring frequent dilation by her ENT physician at [facility].  Patient coughed up to thick mucous plugs from stoma while in the ED, area was cleansed by respiratory therapy.  Family states that patient was getting weekly EPO shots with her nephrologist but was unable to travel for the shots the last couple months due to pain from nonhealing left foot wound.  They have not seen any obvious bleeding including hematochezia or melena.  ED Course  Labs/Imaging on admission: I have personally reviewed following labs and imaging studies.  Initial vitals showed BP 173/77, pulse 102, RR 22, temp 97.4 F, SpO2 100% on room air.  Labs showed WBC 7.8, hemoglobin 6.6, platelets 223,000, sodium 135, potassium 6.3, bicarb 19, BUN 128, creatinine 8.22, serum glucose 98, BNP  170.5, troponin 316, ammonia <10.  CT head without contrast negative for acute intracranial process.  Portable chest x-ray negative for focal consolidation, edema, effusion.  Patient was given 500 cc normal saline, IV NovoLog 5 units with D50, 1 g IV calcium gluconate, 1 amp sodium bicarb, 10 mg albuterol nebulizer treatment.  EDP discussed with on-call nephrology who recommended also treated with Lokelma and maintenance fluids with LR.  The hospitalist service was consulted to admit.  Review of Systems: All systems reviewed and are negative except as documented in history of present illness above.   Past Medical History:  Diagnosis Date   Anemia    Anxiety    takes Ativan prn   Blood transfusion without reported diagnosis 09/15/12   2 units Prbc's   Broken ribs    Chronic back pain    CKD (chronic kidney disease), stage IV (HCC)    Constipation    related to pain meds   COPD (chronic obstructive pulmonary disease) (HCC) 08/10/2012   denies   Depression    Gastrostomy in place [facility])    removed   GERD (gastroesophageal reflux disease)    takes Zantac daily   Headache(784.0)    Hiatal hernia 08/10/2012   History of radiation therapy 10/17/12-11/25/12   supraglottic larynx,high risk neck tumor bed 5880 cGy/28 sessions, high risk lymph node tumor bed 5600 cGy/20 sessions, mod risk lymph node tumor bed 5040 cGy/20 sessions   Hypercholesteremia    takes Pravastatin daily   Hypertension    takes Tribenzor and Atenolol daily   Insomnia    takes Amitriptyline daily   Nausea    takes Zofran prn  PAD (peripheral artery disease) (HCC)    noninvasive imaging in 2016   Pneumonia    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 08/08/2012   Shortness of breath dyspnea    Stroke [facility]) 2011   denies. no residual   Uterine cancer [facility])     Past Surgical History:  Procedure Laterality Date   ABDOMINAL SURGERY     r/t uterine carcinoma   APPENDECTOMY     DIRECT LARYNGOSCOPY N/A  05/22/2014   Procedure: DIRECT LARYNGOSCOPY WITH ESOPHAGEAL DILATION;  Surgeon: Ammon Bales, MD;  Location: [facility] OR;  Service: ENT;  Laterality: N/A;   ESOPHAGOSCOPY WITH DILITATION N/A 05/29/2015   Procedure: ESOPHAGOSCOPY WITH ESOPHAGEAL DILITATION;  Surgeon: Ammon Bales, MD;  Location: [facility] OR;  Service: ENT;  Laterality: N/A;   Gastrostomy Tube removed   2013   GASTROSTOMY W/ FEEDING TUBE  13   HERNIA REPAIR     child   LARYNGETOMY  08/31/2012   Procedure: LARYNGECTOMY;  Surgeon: Ammon Bales, MD;  Location: [facility] OR;  Service: ENT;  Laterality: N/A;   LARYNGOSCOPY  08/10/2012   Procedure: LARYNGOSCOPY;  Surgeon: Ammon Bales, MD;  Location: [facility] ORS;  Service: ENT;  Laterality: N/A;  with biopsy   RADICAL NECK DISSECTION  08/31/2012   Procedure: RADICAL NECK DISSECTION;  Surgeon: Ammon Bales, MD;  Location: [facility] OR;  Service: ENT;  Laterality: Bilateral;   TRACHEAL ESOPHOGEAL PUNCTURE WITH REPAIR STOMA N/A 09/08/2013   Procedure: TRACHEAL ESOPHOGEAL PUNCTURE WITH PLACEMENT OF  PROVOX PROSTHESIS ;  Surgeon: Ammon Bales, MD;  Location: [facility] OR;  Service: ENT;  Laterality: N/A;   TRACHEOSTOMY TUBE PLACEMENT  08/10/2012   Procedure: TRACHEOSTOMY;  Surgeon: Ammon Bales, MD;  Location: [facility] ORS;  Service: ENT;  Laterality: N/A;    Social History: Social History   Tobacco Use   Smoking status: Former    Current packs/day: 0.00    Average packs/day: 0.3 packs/day for 50.0 years (12.5 ttl pk-yrs)    Types: Cigarettes    Start date: 05/28/1963    Quit date: 05/27/2013    Years since quitting: 10.8    Passive exposure: Past   Smokeless tobacco: Never  Vaping Use   Vaping status: Never Used  Substance Use Topics   Alcohol use: Not Currently   Drug use: No    Allergies  Allergen Reactions   Xyzal [Levocetirizine Dihydrochloride] Itching   Augmentin [Amoxicillin-Pot Clavulanate] Diarrhea and Nausea And Vomiting   Tribenzor [Olmesartan-Amlodipine-Hctz] Other (See Comments)     "Hurt the kidneys"    Family History  Problem Relation Age of Onset   Throat cancer Father    Brain cancer Brother    CAD Neg Hx      Prior to Admission medications   Medication Sig Start Date End Date Taking? Authorizing Provider  acetaminophen-codeine (TYLENOL #3) 300-30 MG tablet Take 1 tablet by mouth every 6 (six) hours as needed. 10/08/23   Adelia Homestead, MD  amLODipine (NORVASC) 10 MG tablet Take 1 tablet (10 mg total) by mouth daily. 02/17/24   Adelia Homestead, MD  apixaban (ELIQUIS) 2.5 MG TABS tablet Take 1 tablet (2.5 mg total) by mouth 2 (two) times daily. 02/17/24   Adelia Homestead, MD  aspirin EC 81 MG tablet Take 1 tablet (81 mg total) by mouth daily. Swallow whole. 10/31/22   Regalado, Belkys A, MD  calcitRIOL (ROCALTROL) 0.25 MCG capsule Take 1 capsule (0.25 mcg total) by mouth daily. 02/17/24   Adelia Homestead, MD  carvedilol (COREG) 25 MG tablet Take 1 tablet (25 mg total) by mouth 2 (two) times daily with a meal. 02/17/24 03/18/24  Adelia Homestead, MD  cilostazol (PLETAL) 100 MG tablet Take 1 tablet (100 mg total) by mouth 2 (two) times daily. 02/17/24   Adelia Homestead, MD  cloNIDine (CATAPRES - DOSED IN MG/24 HR) 0.1 mg/24hr patch Place 1 patch (0.1 mg total) onto the skin once a week. 02/17/24   Adelia Homestead, MD  cloNIDine (CATAPRES) 0.2 MG tablet TAKE 1 TABLET(0.2 MG) BY MOUTH TWICE DAILY 01/07/24   Adelia Homestead, MD  cyclobenzaprine (FLEXERIL) 5 MG tablet Take 1 tablet (5 mg total) by mouth at bedtime. 10/08/23   Adelia Homestead, MD  doxycycline (VIBRA-TABS) 100 MG tablet Take 1 tablet (100 mg total) by mouth 2 (two) times daily. 02/25/24   Adelia Homestead, MD  DULoxetine (CYMBALTA) 30 MG capsule Take 1 capsule (30 mg total) by mouth daily. 02/17/24   Adelia Homestead, MD  hydrALAZINE (APRESOLINE) 100 MG tablet Take 1 tablet (100 mg total) by mouth 3 (three) times daily. 02/17/24   Adelia Homestead, MD   levofloxacin (LEVAQUIN) 250 MG tablet Take 1 tablet (250 mg total) by mouth every other day. 02/17/24   Adelia Homestead, MD  levothyroxine (SYNTHROID) 50 MCG tablet Take 1 tablet (50 mcg total) by mouth daily before breakfast. 02/17/24   Adelia Homestead, MD  LORazepam (ATIVAN) 1 MG tablet Take 1 tablet (1 mg total) by mouth 2 (two) times daily. 02/17/24   Adelia Homestead, MD  mirtazapine (REMERON) 30 MG tablet Take 1 tablet (30 mg total) by mouth at bedtime. 02/25/24   Adelia Homestead, MD  nitroGLYCERIN (NITROSTAT) 0.4 MG SL tablet Place 1 tablet (0.4 mg total) under the tongue every 5 (five) minutes x 3 doses as needed for chest pain. 10/31/22   Regalado, Belkys A, MD  omeprazole (PRILOSEC) 40 MG capsule Take 1 capsule (40 mg total) by mouth daily. 02/17/24   Adelia Homestead, MD  ondansetron (ZOFRAN) 40 MG/20ML SOLN injection Inject 2 mL intramuscularly every 6 hours as needed for nausea or vomiting 01/12/24   Adelia Homestead, MD  ondansetron (ZOFRAN-ODT) 8 MG disintegrating tablet Take 1 tablet (8 mg total) by mouth every 8 (eight) hours as needed for nausea or vomiting. 02/17/24   Adelia Homestead, MD  pantoprazole (PROTONIX) 40 MG tablet Take 1 tablet (40 mg total) by mouth daily. 02/17/24   Adelia Homestead, MD  rosuvastatin (CRESTOR) 20 MG tablet Take 1 tablet (20 mg total) by mouth daily. 02/17/24   Adelia Homestead, MD  sodium bicarbonate 650 MG tablet Take 1 tablet (650 mg total) by mouth 3 (three) times daily. 02/17/24   Adelia Homestead, MD  SYRINGE-NEEDLE, DISP, 3 ML (B-D INTEGRA SYRINGE) 23G X 1" 3 ML MISC Use with zofran injections 01/12/24   Adelia Homestead, MD  torsemide (DEMADEX) 20 MG tablet Take 2 tablets (40 mg total) by mouth 2 (two) times daily. 02/17/24   Adelia Homestead, MD  traMADol (ULTRAM) 50 MG tablet Take 1 tablet (50 mg total) by mouth every 6 (six) hours as needed. 02/17/24   Adelia Homestead, MD  VELTASSA 8.4 g packet  Take 8.4 g by mouth See admin instructions. Alyson Jump, Sat 08/13/23   [provider]  levocetirizine (XYZAL) 5 MG tablet TAKE 1 TABLET(5 MG) BY MOUTH EVERY EVENING 02/21/20 06/10/20  Arcadio Knuckles,  MD    Physical Exam: Vitals:   03/26/24 1845 03/26/24 1900 03/26/24 1915 03/26/24 1930  BP: (!) 172/80 (!) 160/62 (!) 180/67 (!) 185/71  Pulse: 95 92 93 89  Resp: (!) 46 (!) 32 (!) 28 (!) 24  Temp:      TempSrc:      SpO2: 100% 100% 100% 100%  Weight:      Height:       Constitutional: Chronically ill-appearing woman resting in bed.  NAD, calm. Eyes: EOMI, lids and conjunctivae normal ENMT: Mucous membranes are dry. Posterior pharynx clear of any exudate or lesions.Normal dentition.  Neck: Stoma without any active secretions Respiratory: clear to auscultation bilaterally, no wheezing, no crackles. Normal respiratory effort. No accessory muscle use.  Cardiovascular: Regular rate and rhythm, no murmurs / rubs / gallops. No extremity edema. 2+ pedal pulses. Abdomen: no tenderness, no masses palpated. Musculoskeletal: no clubbing / cyanosis. No joint deformity upper and lower extremities. Good ROM, no contractures. Normal muscle tone.  Thin extremities with muscle wasting. Skin: no rashes, lesions, ulcers. No induration Neurologic: Sensation intact. Strength equal bilaterally. Psychiatric: Awake, alert and interacting appropriately.  Unable to assess orientation via questioning due to nonverbal status.  EKG: Personally reviewed. Sinus tachycardia, rate 105, no peaked T wave changes, motion artifact.  Assessment/Plan Principal Problem:   Hyperkalemia Active Problems:   Acute on chronic anemia   CKD (chronic kidney disease) stage 5, GFR less than 15 ml/min (HCC)   Paroxysmal atrial fibrillation (HCC)   Hypothyroidism   S/P laryngectomy   Chronic diastolic (congestive) heart failure (HCC)   Coronary artery disease involving native coronary artery of native heart with refractory angina  pectoris (HCC)   Depression with anxiety   [name] is a 78 y.o. female with medical history significant for laryngeal cancer s/p laryngectomy and decannulated tracheostomy with stoma, CKD stage V, PAF on Eliquis, chronic HFpEF, CAD, PAD, history of CVA, HTN, anemia of chronic disease, hypothyroidism, depression/anxiety who is admitted with hyperkalemia and acute on chronic anemia in setting of CKD stage V.  Assessment and Plan: Hyperkalemia: Potassium 6.3 on admission in setting of CKD stage V.  No significant EKG changes.  Multiple treatment modalities initiated while in the ED. - Continue Lokelma 10 g every 4 hours x 3 doses - Continue LR@75  mL/hour overnight - Keep on telemetry - Repeat BMP tonight and with a.m. labs  Acute on chronic anemia: Hemoglobin 6.6 on admission compared to baseline around 8.  There has not been any obvious bleeding.  She has not been able to get her EPO shots for the last couple months. - Transfuse 1 unit PRBC - Check FOBT and anemia panel - Hold Eliquis and aspirin for now  CKD stage V: Renal function at baseline with creatinine 8.22 on admission.  Patient still making urine.  She has declined initiation of hemodialysis in the past.  Continue sodium bicarb tablets and monitor.  History of laryngeal cancer s/p laryngectomy and decannulated tracheostomy with stoma: Patient coughed up thick mucous plugs from stomal while in the ED.  Seen by respiratory therapy and area has been cleaned.  No active secretions from stoma at time of admission.  Continue stoma care.  CAD with chronic troponin elevation: Patient with persistent elevated troponin.  She has a diagnosis of CAD by prior imaging, no previous cardiac cath seen on file.  EKG without significant ST changes.  He was not considered to candidate for cardiac cath in the past when she  had an NSTEMI. - Holding aspirin and Eliquis as above - Continue Coreg and rosuvastatin, NTG as needed  Paroxysmal  atrial fibrillation: Continue Coreg.  Holding Eliquis as above.  Chronic HFpEF: Appears euvolemic on admission.  She is receiving gentle IV fluid hydration for management of hyperkalemia and CKD V as above. - Continue Coreg - Anticipate resuming torsemide in a.m. - Monitor I/O's and daily weights  Hypertension: Continue amlodipine, Coreg, hydralazine, clonidine.  Hypothyroidism: Continue Synthroid.  Depression/anxiety: Continue Remeron, Cymbalta, and home Ativan 1 mg twice daily.   DVT prophylaxis: SCDs Start: 03/26/24 1922 Code Status:   Code Status: Limited: Do not attempt resuscitation (DNR) -DNR-LIMITED -Do Not Intubate/DNI   Family Communication: Son and daughter-in-law by phone Disposition Plan: From home, dispo pending clinical progress Consults called: None Severity of Illness: The appropriate patient status for this patient is OBSERVATION. Observation status is judged to be reasonable and necessary in order to provide the required intensity of service to ensure the patient's safety. The patient's presenting symptoms, physical exam findings, and initial radiographic and laboratory data in the context of their medical condition is felt to place them at decreased risk for further clinical deterioration. Furthermore, it is anticipated that the patient will be medically stable for discharge from the hospital within 2 midnights of admission.   Ann Gores MD [facility]  If 7PM-7AM, please contact night-coverage [email or web address]  03/26/2024, 8:25 PM

## 2024-03-26 NOTE — ED Notes (Signed)
 Attempted to complete triage using interpreter, no response to any questions verbal or otherwise with sole exception of "are you in pain" to which the pt put hand on head and then chest. PT otherwise refused to shake head yes or no to interpreter or thumbs up or down to questions. Unable to complete further triage questions at this time despite multiple attempts.  Interpreter used Evin # [id number]

## 2024-03-26 NOTE — ED Triage Notes (Signed)
 Pt from home with ems for AMS since Friday per pts daughter in law. Pt has hx of stage 5 kidney failure, not on dialysis but daughter in law is trying to work on getting Hospice care involved. Pt has a DNR at home but family could not find it.  Less responsive, normally a/ox4 but she is confused with time and place now. Pt not eating, drinking or taking meds.  Pt has stoma from prior trach, hx of throat cancer

## 2024-03-26 NOTE — ED Notes (Addendum)
 Triage questions answered with help of daughter who pt is more responsive too.  Pt daughter reports Pt has had no medicine today other than lorazepam. PT daughter also reports polypharmacy ("she takes a lot of medicines and she has had none of them") .

## 2024-03-26 NOTE — Progress Notes (Signed)
 Called to assess patient stoma. Area cleansed, patient coughed up thick mucous plugs X2. Patient declines dressing over stoma. Vitals stable. HR 84-RR-18-100% on RA

## 2024-03-26 NOTE — ED Provider Notes (Signed)
 [facility] Provider Note   CSN: [id number] Arrival date & time: 03/26/24  1452     History  Chief Complaint  Patient presents with   Failure To Thrive    [name] is a 78 y.o. female.  78 year old female with past medical history of laryngeal cancer with tracheostomy who is now decannulated with stoma as well as some chronic kidney disease presenting to the emergency department today with more of a failure to thrive type picture.  The patient has had decreased oral intake over the past week or 2.  She has been complaining of headache as well as some intermittent chest pain.  Her family states that she has been having some secretions from her stoma.  She has been afebrile.  The patient herself is able to communicate by mouthing words but we are unable to use her services to get history out of her.  She was brought in today for further evaluation.  She apparently does have some stenosis of either her posterior oropharynx or esophagus and has had to have this dilated in the past.  Her family did try to get in touch with her ENT physician at [facility] and they were unable to see the patient until May.  She was brought in today for further evaluation regarding this.  They report that the patient has been more confused than normal recently.        Home Medications Prior to Admission medications   Medication Sig Start Date End Date Taking? Authorizing Provider  acetaminophen-codeine (TYLENOL #3) 300-30 MG tablet Take 1 tablet by mouth every 6 (six) hours as needed. 10/08/23   Adelia Homestead, MD  amLODipine (NORVASC) 10 MG tablet Take 1 tablet (10 mg total) by mouth daily. 02/17/24   Adelia Homestead, MD  apixaban (ELIQUIS) 2.5 MG TABS tablet Take 1 tablet (2.5 mg total) by mouth 2 (two) times daily. 02/17/24   Adelia Homestead, MD  aspirin EC 81 MG tablet Take 1 tablet (81 mg total) by mouth daily. Swallow whole. 10/31/22    Regalado, Belkys A, MD  calcitRIOL (ROCALTROL) 0.25 MCG capsule Take 1 capsule (0.25 mcg total) by mouth daily. 02/17/24   Adelia Homestead, MD  carvedilol (COREG) 25 MG tablet Take 1 tablet (25 mg total) by mouth 2 (two) times daily with a meal. 02/17/24 03/18/24  Adelia Homestead, MD  cilostazol (PLETAL) 100 MG tablet Take 1 tablet (100 mg total) by mouth 2 (two) times daily. 02/17/24   Adelia Homestead, MD  cloNIDine (CATAPRES - DOSED IN MG/24 HR) 0.1 mg/24hr patch Place 1 patch (0.1 mg total) onto the skin once a week. 02/17/24   Adelia Homestead, MD  cloNIDine (CATAPRES) 0.2 MG tablet TAKE 1 TABLET(0.2 MG) BY MOUTH TWICE DAILY 01/07/24   Adelia Homestead, MD  cyclobenzaprine (FLEXERIL) 5 MG tablet Take 1 tablet (5 mg total) by mouth at bedtime. 10/08/23   Adelia Homestead, MD  doxycycline (VIBRA-TABS) 100 MG tablet Take 1 tablet (100 mg total) by mouth 2 (two) times daily. 02/25/24   Adelia Homestead, MD  DULoxetine (CYMBALTA) 30 MG capsule Take 1 capsule (30 mg total) by mouth daily. 02/17/24   Adelia Homestead, MD  hydrALAZINE (APRESOLINE) 100 MG tablet Take 1 tablet (100 mg total) by mouth 3 (three) times daily. 02/17/24   Adelia Homestead, MD  levofloxacin (LEVAQUIN) 250 MG tablet Take 1 tablet (250 mg total) by  mouth every other day. 02/17/24   Adelia Homestead, MD  levothyroxine (SYNTHROID) 50 MCG tablet Take 1 tablet (50 mcg total) by mouth daily before breakfast. 02/17/24   Adelia Homestead, MD  LORazepam (ATIVAN) 1 MG tablet Take 1 tablet (1 mg total) by mouth 2 (two) times daily. 02/17/24   Adelia Homestead, MD  mirtazapine (REMERON) 30 MG tablet Take 1 tablet (30 mg total) by mouth at bedtime. 02/25/24   Adelia Homestead, MD  nitroGLYCERIN (NITROSTAT) 0.4 MG SL tablet Place 1 tablet (0.4 mg total) under the tongue every 5 (five) minutes x 3 doses as needed for chest pain. 10/31/22   Regalado, Belkys A, MD  omeprazole (PRILOSEC) 40 MG capsule  Take 1 capsule (40 mg total) by mouth daily. 02/17/24   Adelia Homestead, MD  ondansetron (ZOFRAN) 40 MG/20ML SOLN injection Inject 2 mL intramuscularly every 6 hours as needed for nausea or vomiting 01/12/24   Adelia Homestead, MD  ondansetron (ZOFRAN-ODT) 8 MG disintegrating tablet Take 1 tablet (8 mg total) by mouth every 8 (eight) hours as needed for nausea or vomiting. 02/17/24   Adelia Homestead, MD  pantoprazole (PROTONIX) 40 MG tablet Take 1 tablet (40 mg total) by mouth daily. 02/17/24   Adelia Homestead, MD  rosuvastatin (CRESTOR) 20 MG tablet Take 1 tablet (20 mg total) by mouth daily. 02/17/24   Adelia Homestead, MD  sodium bicarbonate 650 MG tablet Take 1 tablet (650 mg total) by mouth 3 (three) times daily. 02/17/24   Adelia Homestead, MD  SYRINGE-NEEDLE, DISP, 3 ML (B-D INTEGRA SYRINGE) 23G X 1" 3 ML MISC Use with zofran injections 01/12/24   Adelia Homestead, MD  torsemide (DEMADEX) 20 MG tablet Take 2 tablets (40 mg total) by mouth 2 (two) times daily. 02/17/24   Adelia Homestead, MD  traMADol (ULTRAM) 50 MG tablet Take 1 tablet (50 mg total) by mouth every 6 (six) hours as needed. 02/17/24   Adelia Homestead, MD  VELTASSA 8.4 g packet Take 8.4 g by mouth See admin instructions. Alyson Jump, Sat 08/13/23   [provider]  levocetirizine (XYZAL) 5 MG tablet TAKE 1 TABLET(5 MG) BY MOUTH EVERY EVENING 02/21/20 06/10/20  Arcadio Knuckles, MD      Allergies    Xyzal [levocetirizine dihydrochloride], Augmentin [amoxicillin-pot clavulanate], and Tribenzor [olmesartan-amlodipine-hctz]    Review of Systems   Review of Systems  Physical Exam Updated Vital Signs BP (!) 173/77   Pulse (!) 102   Temp (!) 97.4 F (36.3 C) (Oral)   Resp (!) 22   Ht 5' (1.524 m) Comment: By family  Wt 43.5 kg Comment: by family  SpO2 100%   BMI 18.75 kg/m  Physical Exam Vitals and nursing note reviewed.   Gen: NAD, chronically ill-appearing Eyes: PERRL, EOMI HEENT: no  oropharyngeal swelling Neck: trachea midline, there appeared to be some thick secretions noted the patient's stoma Resp: clear to auscultation bilaterally Card: RRR, no murmurs, rubs, or gallops Abd: nontender, nondistended Extremities: no calf tenderness, no edema Vascular: 2+ radial pulses bilaterally, 2+ DP pulses bilaterally Neuro: No obvious focal deficits noted Skin: no rashes Psyc: acting appropriately   ED Results / Procedures / Treatments   Labs (all labs ordered are listed, but only abnormal results are displayed) Labs Reviewed  BASIC METABOLIC PANEL WITH GFR - Abnormal; Notable for the following components:      Result Value   Potassium 6.3 (*)  CO2 19 (*)    BUN 128 (*)    Creatinine, Ser 8.22 (*)    GFR, Estimated 5 (*)    All other components within normal limits  CBC - Abnormal; Notable for the following components:   RBC 2.44 (*)    Hemoglobin 6.6 (*)    HCT 20.5 (*)    RDW 18.2 (*)    All other components within normal limits  BRAIN NATRIURETIC PEPTIDE - Abnormal; Notable for the following components:   B Natriuretic Peptide 170.5 (*)    All other components within normal limits  URINALYSIS, ROUTINE W REFLEX MICROSCOPIC - Abnormal; Notable for the following components:   Glucose, UA 50 (*)    Protein, ur >=300 (*)    Bacteria, UA MANY (*)    All other components within normal limits  TROPONIN I (HIGH SENSITIVITY) - Abnormal; Notable for the following components:   Troponin I (High Sensitivity) 316 (*)    All other components within normal limits  AMMONIA  TYPE AND SCREEN  TROPONIN I (HIGH SENSITIVITY)    EKG None  Radiology CT Head Wo Contrast Result Date: 03/26/2024 CLINICAL DATA:  Headache EXAM: CT HEAD WITHOUT CONTRAST TECHNIQUE: Contiguous axial images were obtained from the base of the skull through the vertex without intravenous contrast. RADIATION DOSE REDUCTION: This exam was performed according to the departmental dose-optimization  program which includes automated exposure control, adjustment of the mA and/or kV according to patient size and/or use of iterative reconstruction technique. COMPARISON:  Head CT 10/28/2022. FINDINGS: Brain: No evidence of acute infarction, hemorrhage, hydrocephalus, extra-axial collection or mass lesion/mass effect. Mild diffuse atrophy and mild periventricular white matter hypodensity appears similar to the prior study. Vascular: Atherosclerotic calcifications are present within the cavernous internal carotid arteries. Skull: Skip there is near complete opacification of the right maxillary sinus with air-fluid level present. Mastoid air cells are clear. Orbits are within normal limits. Sinuses/Orbits: No acute finding. Other: None. IMPRESSION: 1. No acute intracranial process. 2. Acute right maxillary sinus disease. Electronically Signed   By: Darliss Cheney M.D.   On: 03/26/2024 17:19   DG Chest Port 1 View Result Date: 03/26/2024 CLINICAL DATA:  Shortness of breath EXAM: PORTABLE CHEST 1 VIEW COMPARISON:  09/05/2023 FINDINGS: Heart and mediastinal contours are within normal limits. No focal opacities or effusions. No acute bony abnormality. Aortic atherosclerosis. IMPRESSION: No active disease. Electronically Signed   By: Charlett Nose M.D.   On: 03/26/2024 17:04    Procedures Procedures    Medications Ordered in ED Medications  sodium chloride 0.9 % bolus 500 mL (has no administration in time range)  calcium gluconate inj 10% (1 g) URGENT USE ONLY! (has no administration in time range)  albuterol (PROVENTIL) (2.5 MG/3ML) 0.083% nebulizer solution 10 mg (has no administration in time range)  insulin aspart (novoLOG) injection 5 Units (has no administration in time range)    And  dextrose 50 % solution 50 mL (has no administration in time range)  sodium bicarbonate injection 50 mEq (has no administration in time range)  sodium zirconium cyclosilicate (LOKELMA) packet 10 g (has no administration  in time range)  lactated ringers infusion (has no administration in time range)    ED Course/ Medical Decision Making/ A&P                                 Medical Decision Making 78 year old female past medical history of  chronic kidney disease as well as oropharyngeal cancer status post tracheostomy presenting to the emergency department today with essentially failure to thrive type picture.  I will obtain basic labs here to evaluate for anemia or electrolyte abnormalities.  Will obtain a CT scan of her head given her headache as well as an EKG and troponin to evaluate for ACS, pulmonary edema, pulmonary infiltrates, pneumothorax.  Given the confusion will obtain an ammonia level and urinalysis.  Will hold off on fluids until her chest x-ray returns.  I will reevaluate for ultimate disposition.  Back the patient's creatinine is relatively close to baseline.  Her potassium is 6.3 and she does have some peaked T waves and QTc is borderline prolonged.  The patient is treated medically for this.  I did call and discussed this with nephrology.  They did recommend medical management and patient is also found to be.  A call was placed to hospitalist service for admission.  Transfusion is ordered.  CRITICAL CARE Performed by: Carin Charleston   Total critical care time: 40 minutes  Critical care time was exclusive of separately billable procedures and treating other patients.  Critical care was necessary to treat or prevent imminent or life-threatening deterioration.  Critical care was time spent personally by me on the following activities: development of treatment plan with patient and/or surrogate as well as nursing, discussions with consultants, evaluation of patient's response to treatment, examination of patient, obtaining history from patient or surrogate, ordering and performing treatments and interventions, ordering and review of laboratory studies, ordering and review of radiographic studies,  pulse oximetry and re-evaluation of patient's condition.   Amount and/or Complexity of Data Reviewed Labs: ordered. Radiology: ordered.  Risk OTC drugs. Prescription drug management. Decision regarding hospitalization.           Final Clinical Impression(s) / ED Diagnoses Final diagnoses:  Failure to thrive in adult  Hyperkalemia    Rx / DC Orders ED Discharge Orders     None         Carin Charleston, MD 03/26/24 1818

## 2024-03-27 DIAGNOSIS — I25112 Atherosclerotic heart disease of native coronary artery with refractory angina pectoris: Secondary | ICD-10-CM | POA: Diagnosis not present

## 2024-03-27 DIAGNOSIS — R627 Adult failure to thrive: Secondary | ICD-10-CM | POA: Diagnosis not present

## 2024-03-27 DIAGNOSIS — Z9002 Acquired absence of larynx: Secondary | ICD-10-CM | POA: Diagnosis not present

## 2024-03-27 DIAGNOSIS — Z66 Do not resuscitate: Secondary | ICD-10-CM | POA: Diagnosis not present

## 2024-03-27 DIAGNOSIS — Z515 Encounter for palliative care: Secondary | ICD-10-CM

## 2024-03-27 DIAGNOSIS — I5032 Chronic diastolic (congestive) heart failure: Secondary | ICD-10-CM | POA: Diagnosis not present

## 2024-03-27 DIAGNOSIS — D649 Anemia, unspecified: Secondary | ICD-10-CM | POA: Diagnosis not present

## 2024-03-27 DIAGNOSIS — E039 Hypothyroidism, unspecified: Secondary | ICD-10-CM | POA: Diagnosis not present

## 2024-03-27 DIAGNOSIS — I480 Paroxysmal atrial fibrillation: Secondary | ICD-10-CM | POA: Diagnosis not present

## 2024-03-27 DIAGNOSIS — F418 Other specified anxiety disorders: Secondary | ICD-10-CM

## 2024-03-27 DIAGNOSIS — Z7189 Other specified counseling: Secondary | ICD-10-CM | POA: Diagnosis not present

## 2024-03-27 DIAGNOSIS — N185 Chronic kidney disease, stage 5: Secondary | ICD-10-CM | POA: Diagnosis not present

## 2024-03-27 DIAGNOSIS — E875 Hyperkalemia: Secondary | ICD-10-CM | POA: Diagnosis not present

## 2024-03-27 LAB — BASIC METABOLIC PANEL WITH GFR
Anion gap: 17 — ABNORMAL HIGH (ref 5–15)
BUN: 121 mg/dL — ABNORMAL HIGH (ref 8–23)
CO2: 16 mmol/L — ABNORMAL LOW (ref 22–32)
Calcium: 9.8 mg/dL (ref 8.9–10.3)
Chloride: 104 mmol/L (ref 98–111)
Creatinine, Ser: 7.95 mg/dL — ABNORMAL HIGH (ref 0.44–1.00)
GFR, Estimated: 5 mL/min — ABNORMAL LOW (ref >60)
Glucose, Bld: 92 mg/dL (ref 70–99)
Potassium: 5.1 mmol/L (ref 3.5–5.1)
Sodium: 137 mmol/L (ref 135–145)

## 2024-03-27 LAB — CBC
HCT: 25.1 % — ABNORMAL LOW (ref 36.0–46.0)
Hemoglobin: 8.5 g/dL — ABNORMAL LOW (ref 12.0–15.0)
MCH: 28.6 pg (ref 26.0–34.0)
MCHC: 33.9 g/dL (ref 30.0–36.0)
MCV: 84.5 fL (ref 80.0–100.0)
Platelets: 179 10*3/uL (ref 150–400)
RBC: 2.97 MIL/uL — ABNORMAL LOW (ref 3.87–5.11)
RDW: 17.5 % — ABNORMAL HIGH (ref 11.5–15.5)
WBC: 9.3 10*3/uL (ref 4.0–10.5)
nRBC: 0 % (ref 0.0–0.2)

## 2024-03-27 LAB — TYPE AND SCREEN
ABO/RH(D): O POS
Antibody Screen: NEGATIVE
Unit division: 0

## 2024-03-27 LAB — CBG MONITORING, ED: Glucose-Capillary: 92 mg/dL (ref 70–99)

## 2024-03-27 LAB — BPAM RBC
Blood Product Expiration Date: 202505072359
ISSUE DATE / TIME: 202504132119
Unit Type and Rh: 5100

## 2024-03-27 MED ORDER — BACITRACIN-NEOMYCIN-POLYMYXIN OINTMENT TUBE
TOPICAL_OINTMENT | CUTANEOUS | Status: DC | PRN
Start: 2024-03-27 — End: 2024-03-29

## 2024-03-27 MED ORDER — SODIUM ZIRCONIUM CYCLOSILICATE 10 G PO PACK
10.0000 g | PACK | Freq: Two times a day (BID) | ORAL | Status: DC
  Administered 2024-03-27 – 2024-03-28 (×2): 10 g via ORAL
  Filled 2024-03-27 (×2): qty 1

## 2024-03-27 MED ORDER — HEPARIN SODIUM (PORCINE) 5000 UNIT/ML IJ SOLN
5000.0000 [IU] | Freq: Three times a day (TID) | INTRAMUSCULAR | Status: DC
  Administered 2024-03-27 – 2024-03-29 (×5): 5000 [IU] via SUBCUTANEOUS
  Filled 2024-03-27 (×5): qty 1

## 2024-03-27 MED ORDER — PROCHLORPERAZINE EDISYLATE 10 MG/2ML IJ SOLN
5.0000 mg | Freq: Once | INTRAMUSCULAR | Status: AC
  Administered 2024-03-27: 5 mg via INTRAVENOUS
  Filled 2024-03-27: qty 2

## 2024-03-27 MED ORDER — HYDROXYZINE HCL 10 MG PO TABS
10.0000 mg | ORAL_TABLET | Freq: Once | ORAL | Status: AC
  Administered 2024-03-27: 10 mg via ORAL
  Filled 2024-03-27: qty 1

## 2024-03-27 MED ORDER — HYDRALAZINE HCL 20 MG/ML IJ SOLN
5.0000 mg | Freq: Four times a day (QID) | INTRAMUSCULAR | Status: DC | PRN
  Administered 2024-03-29 (×2): 5 mg via INTRAVENOUS
  Filled 2024-03-27 (×2): qty 1

## 2024-03-27 NOTE — Progress Notes (Signed)
 PROGRESS NOTE    [name]  MVH:[id number] DOB: [birth year] DOA: 03/26/2024 PCP: Myrlene Broker, MD   Chief Complaint  Patient presents with   Failure To Thrive    Brief Narrative: Patient 78 year old female with history significant for laryngeal cancer status post laryngectomy and decannulated tracheostomy with stoma, CKD stage V, paroxysmal A-fib on Eliquis, chronic HFpEF, CAD, PAD, history of CVA, hypertension, anemia of chronic disease, hypothyroidism, depression/anxiety admitted with hyperkalemia and acute on chronic anemia in the setting of CKD stage V.  Patient also admitted with failure to thrive.  Case discussed with patient's primary nephrologist, Dr. Thedore Mins who felt patient was uremic, patient did not want hemodialysis and had recommended palliative care input as patient likely needs hospice.   Assessment & Plan:   Principal Problem:   Hyperkalemia Active Problems:   Acute on chronic anemia   CKD (chronic kidney disease) stage 5, GFR less than 15 ml/min (HCC)   Paroxysmal atrial fibrillation (HCC)   Hypothyroidism   S/P laryngectomy   Chronic diastolic (congestive) heart failure (HCC)   Coronary artery disease involving native coronary artery of native heart with refractory angina pectoris (HCC)   Depression with anxiety   #1 hyperkalemia -Noted on admission to have a potassium of 6.3 in the setting of CKD stage V.  No significant EKG changes noted. -Likely secondary to progressive chronic kidney disease stage V. - Patient underwent multiple treatment modalities on presentation to the ED. - Status post Lokelma 10 g every 4 hours x 3 doses. - Patient hydrated gently with IV fluids. - Potassium at 5.1 this morning from 6.3 on admission. - Place on Lokelma 10 g twice daily x 4 doses. - Repeat labs in the AM.  2.  Acute on chronic anemia -Hemoglobin noted at 6.6 on admission compared to baseline around 8. - Patient with no overt bleeding. - Patient  noted to be on EPO shots in the outpatient setting however unable to get shots for the last couple of months. - FOBT pending. - Anemia panel with iron of 44, TIBC of 197, ferritin of 1513, folate of 7.6. - Status posttransfusion 1 unit PRBCs with hemoglobin currently at 8.5 from 6.6 on admission. -Continue to hold aspirin and Eliquis. - Follow H&H. - Transfusion threshold hemoglobin < 7.  3.  Progressive CKD stage V/metabolic acidosis. -Patient with creatinine of 8.22 on admission, which was close to baseline. - Patient with a BUN in the 120s. - Patient uremic in discussion with patient's primary nephrologist, Dr. Thedore Mins. - Patient noted to still be making urine. - Patient noted to have declined initiation of hemodialysis in the past. - Continue bicarb tablets. - Case discussed with primary nephrologist who had discussed with family over the past week and patient noted to be declining and highly recommended palliative care/hospice involvement. - Consult with palliative care for further evaluation and management.  4.  History of laryngeal cancer status post laryngectomy and decannulated tracheostomy with stoma -Patient noted to have coughed up thick mucous plugs from stoma while in the ED. - Patient seen by RT and area cleaned. - Most difficult active secretions from stoma at time of admission. - Continue stoma care. - If patient with thickened secretions may consider Mucomyst nebs.  5.  CAD with chronic troponin elevation -Show with persistent troponin elevation. - Patient with history of CAD by imaging, no prior cardiac catheterization noted. - No risky changes noted on EKG. - Patient was not considered a candidate for cardiac cath  in the past when she had had a non-STEMI. - Continue to hold aspirin and Eliquis. - Continue Coreg, Crestor, nitroglycerin as needed.  6.  Paroxysmal A-fib -Continue Coreg for rate control. - Eliquis on hold.  7.  Chronic HFpEF -Euvolemic on  examination. - Status post gentle hydration.  8.  Hypertension -Continue amlodipine, Coreg, hydralazine, clonidine. - IV hydralazine as needed.  9.  Hypothyroidism -Continue home regimen Synthroid.  10.  Depression/anxiety -Continue home regimen Remeron, Cymbalta, Ativan.   DVT prophylaxis: Heparin Code Status: DNR Family Communication: No family at bedside. Disposition: TBD, pending palliative care evaluation.   Status is: Observation The patient remains OBS appropriate and will d/c before 2 midnights.   Consultants:  Palliative care  Procedures:  CT head 03/26/2024 Chest x-ray 03/26/2024 Transfuse 1 unit PRBCs 03/26/2024  Antimicrobials:  Anti-infectives (From admission, onward)    None         Subjective: Laying in bed.  Nonverbal.  Motions with her hands and nods her head yes or no.  Denies any chest pain or shortness of breath.  Denies any abdominal pain.  Some complaints of bilateral lower extremity pain.  Stoma intact.  Objective: Vitals:   03/27/24 1019 03/27/24 1100 03/27/24 1336 03/27/24 1415  BP: (!) 165/76 (!) 183/78 (!) 166/77   Pulse:  81 76   Resp:  (!) 22 18 18   Temp:  98.3 F (36.8 C) 98.3 F (36.8 C)   TempSrc:  Oral Oral Oral  SpO2:  100% 100%   Weight:      Height:       No intake or output data in the 24 hours ending 03/27/24 1640 Filed Weights   03/26/24 1515  Weight: 43.5 kg    Examination:  General exam: Chronically ill-appearing.  NAD.  Appears calm and comfortable  Respiratory system: Clear to auscultation anterior lung fields.  No wheezes, no crackles, no rhonchi.  Stoma intact.  Respiratory effort normal. Cardiovascular system: S1 & S2 heard, RRR. No JVD, murmurs, rubs, gallops or clicks. No pedal edema. Gastrointestinal system: Abdomen is nondistended, soft and nontender. No organomegaly or masses felt. Normal bowel sounds heard. Central nervous system: Alert and oriented. No focal neurological deficits. Extremities:  Bilateral lower extremities tender to palpation.  Symmetric 5 x 5 power. Skin: No rashes, lesions or ulcers Psychiatry: Judgement and insight appear normal. Mood & affect appropriate.     Data Reviewed: I have personally reviewed following labs and imaging studies  CBC: Recent Labs  Lab 03/26/24 1616 03/27/24 0343  WBC 7.8 9.3  HGB 6.6* 8.5*  HCT 20.5* 25.1*  MCV 84.0 84.5  PLT 223 179    Basic Metabolic Panel: Recent Labs  Lab 03/26/24 1616 03/26/24 2131 03/27/24 0343  NA 135 138 137  K 6.3* 5.3* 5.1  CL 102 106 104  CO2 19* 17* 16*  GLUCOSE 98 70 92  BUN 128* 122* 121*  CREATININE 8.22* 7.95* 7.95*  CALCIUM 10.1 10.2 9.8    GFR: Estimated Creatinine Clearance: 4 mL/min (A) (by C-G formula based on SCr of 7.95 mg/dL (H)).  Liver Function Tests: No results for input(s): "AST", "ALT", "ALKPHOS", "BILITOT", "PROT", "ALBUMIN" in the last 168 hours.  CBG: Recent Labs  Lab 03/27/24 0407  GLUCAP 92     No results found for this or any previous visit (from the past 240 hours).       Radiology Studies: CT Head Wo Contrast Result Date: 03/26/2024 CLINICAL DATA:  Headache EXAM: CT HEAD  WITHOUT CONTRAST TECHNIQUE: Contiguous axial images were obtained from the base of the skull through the vertex without intravenous contrast. RADIATION DOSE REDUCTION: This exam was performed according to the departmental dose-optimization program which includes automated exposure control, adjustment of the mA and/or kV according to patient size and/or use of iterative reconstruction technique. COMPARISON:  Head CT 10/28/2022. FINDINGS: Brain: No evidence of acute infarction, hemorrhage, hydrocephalus, extra-axial collection or mass lesion/mass effect. Mild diffuse atrophy and mild periventricular white matter hypodensity appears similar to the prior study. Vascular: Atherosclerotic calcifications are present within the cavernous internal carotid arteries. Skull: Skip there is near  complete opacification of the right maxillary sinus with air-fluid level present. Mastoid air cells are clear. Orbits are within normal limits. Sinuses/Orbits: No acute finding. Other: None. IMPRESSION: 1. No acute intracranial process. 2. Acute right maxillary sinus disease. Electronically Signed   By: Tyron Gallon M.D.   On: 03/26/2024 17:19   DG Chest Port 1 View Result Date: 03/26/2024 CLINICAL DATA:  Shortness of breath EXAM: PORTABLE CHEST 1 VIEW COMPARISON:  09/05/2023 FINDINGS: Heart and mediastinal contours are within normal limits. No focal opacities or effusions. No acute bony abnormality. Aortic atherosclerosis. IMPRESSION: No active disease. Electronically Signed   By: Janeece Mechanic M.D.   On: 03/26/2024 17:04        Scheduled Meds:  amLODipine  10 mg Oral Daily   carvedilol  25 mg Oral BID WC   cloNIDine  0.2 mg Oral BID   cyclobenzaprine  5 mg Oral QHS   DULoxetine  30 mg Oral Daily   heparin injection (subcutaneous)  5,000 Units Subcutaneous Q8H   hydrALAZINE  100 mg Oral TID   levothyroxine  50 mcg Oral Q0600   LORazepam  1 mg Oral BID   mirtazapine  30 mg Oral QHS   pantoprazole  40 mg Oral Daily   rosuvastatin  20 mg Oral Daily   sodium bicarbonate  650 mg Oral TID   sodium chloride flush  3 mL Intravenous Q12H   sodium zirconium cyclosilicate  10 g Oral BID   Continuous Infusions:   LOS: 0 days    Time spent: 40 minutes    Hilda Lovings, MD [facility]   To contact the attending provider between 7A-7P or the covering provider during after hours 7P-7A, please log into the web site [email or web address] and access using universal [place] password for that web site. If you do not have the password, please call the hospital operator.  03/27/2024, 4:40 PM

## 2024-03-27 NOTE — Consult Note (Signed)
 Palliative Care Consult Note                                  Date: 03/27/2024   Patient Name: [name]  DOB: 1946-07-21  MRN: [id number]  Age / Sex: 78 y.o., female  PCP: Myrlene Broker, MD Referring Physician: Rodolph Bong, MD  Reason for Consultation: Establishing goals of care  HPI/Patient Profile: 78 y.o. female  with past medical history of laryngeal cancer status post laryngectomy and decannulated tracheostomy with stoma, CKD stage V, paroxysmal A-fib on Eliquis, chronic HFpEF, CAD, PAD, history of CVA, hypertension, anemia of chronic disease, hypothyroidism, depression/anxiety admitted with hyperkalemia and acute on chronic anemia in the setting of CKD stage V.  Palliative medicine was consulted for GOC conversations.  Past Medical History:  Diagnosis Date   Anemia    Anxiety    takes Ativan prn   Blood transfusion without reported diagnosis 09/15/12   2 units Prbc's   Broken ribs    Chronic back pain    CKD (chronic kidney disease), stage IV (HCC)    Constipation    related to pain meds   COPD (chronic obstructive pulmonary disease) (HCC) 08/10/2012   denies   Depression    Gastrostomy in place [facility])    removed   GERD (gastroesophageal reflux disease)    takes Zantac daily   Headache(784.0)    Hiatal hernia 08/10/2012   History of radiation therapy 10/17/12-11/25/12   supraglottic larynx,high risk neck tumor bed 5880 cGy/28 sessions, high risk lymph node tumor bed 5600 cGy/20 sessions, mod risk lymph node tumor bed 5040 cGy/20 sessions   Hypercholesteremia    takes Pravastatin daily   Hypertension    takes Tribenzor and Atenolol daily   Insomnia    takes Amitriptyline daily   Nausea    takes Zofran prn   PAD (peripheral artery disease) (HCC)    noninvasive imaging in 2016   Pneumonia    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 no residual   Uterine cancer (HCC)     Subjective:   This NP Wynne Dust reviewed medical records, received report from team, assessed the patient and then meet at the patient's bedside to discuss diagnosis, prognosis, GOC, EOL wishes disposition and options.  I met with the patient at the bedside, daughter-in-law was on FaceTime as well.   We meet to discuss diagnosis prognosis, GOC, EOL wishes, disposition and options. Concept of Palliative Care was introduced as specialized medical care for people and their families living with serious illness.  If focuses on providing relief from the symptoms and stress of a serious illness.  The goal is to improve quality of life for both the patient and the family. Values and goals of care important to patient and family were attempted to be elicited.  Created space and opportunity for patient  and family to explore thoughts and feelings regarding current medical situation   Natural trajectory and current clinical status were discussed. Questions and concerns addressed. Patient  encouraged to call with questions or concerns.    Patient/Family Understanding of Illness: They understand that the patient was at home and they called palliative care on Friday and were unable to get a hold of them.  So they called her nephrologist Dr. Thedore Mins.  She was sick on  Tuesday and Thursday, had a great Friday, then got worse on Saturday and even worsened on Sunday at which point he called EMS.  They know she has stage V kidney disease and does not want hemodialysis.  Life Review: Deferred  Patient Values: Deferred  Goals: Deferred  Today's Discussion: Before seeing the patient I extensively reviewed the chart, notes including outpatient notes and conversations between nephrology and the medical team.  I also discussed with the medical team and nephrology today.  Nephrology recommends hospice evaluation, which was ordered as an outpatient but put on  hold.  He feels with uremia and no dialysis that hospice is the most appropriate recommendation.  In addition to discussions described above we had extensive discussion on various topics.  It is clear that the patient has had stage V kidney disease and she has previously refused dialysis.  Per notes it appears the patient is uremic and per medical team and nephrology team hospice appropriate.  We began discussing limitations with end-stage kidney disease without dialysis.  Patient and daughter-in-law indicated the patient did not want to be very involved with these conversations and would like it left to family.  Daughter-in-law asked if we can meet another time for further discussion.  After some discussion we settled on tomorrow at 1:00 PM by phone/FaceTime.  I did ask the patient about any symptoms.  She did fall and hurt her left foot previously and has a wound there, daughter-in-law has been treating that with Neosporin.  She asked for someone to take a look at it and provide any treatment needed.  I assured the patient and daughter-in-law that I would come by tomorrow to check on the patient as well as call daughter-in-law by phone at 1:00. I provided emotional and general support through therapeutic listening, empathy, sharing of stories, and other techniques. I answered all questions and addressed all concerns to the best of my ability.  Review of Systems  Constitutional:        Besides left foot pain denies pain in general  Respiratory:  Negative for cough, chest tightness and shortness of breath.   Cardiovascular:  Negative for chest pain.  Gastrointestinal:  Negative for abdominal pain, nausea and vomiting.  Musculoskeletal:        Left foot pain    Objective:   Primary Diagnoses: Present on Admission:  Hyperkalemia  Paroxysmal atrial fibrillation (HCC)  Coronary artery disease involving native coronary artery of native heart with refractory angina pectoris (HCC)  CKD (chronic  kidney disease) stage 5, GFR less than 15 ml/min (HCC)  Hypothyroidism  Chronic diastolic (congestive) heart failure (HCC)  Acute on chronic anemia  Depression with anxiety   Physical Exam Vitals and nursing note reviewed.  Constitutional:      General: She is not in acute distress.    Appearance: She is ill-appearing. She is not toxic-appearing.  HENT:     Head: Normocephalic and atraumatic.  Cardiovascular:     Rate and Rhythm: Normal rate.  Pulmonary:     Effort: Pulmonary effort is normal. No respiratory distress.     Breath sounds: No wheezing or rhonchi.  Abdominal:     General: Abdomen is flat. Bowel sounds are normal. There is no distension.     Palpations: Abdomen is soft.     Tenderness: There is no abdominal tenderness.  Skin:    General: Skin is warm and dry.  Neurological:     General: No focal deficit present.     Mental  Status: She is alert.  Psychiatric:        Mood and Affect: Mood normal.        Behavior: Behavior normal.     Vital Signs:  BP (!) 166/77   Pulse 76   Temp 98.3 F (36.8 C) (Oral)   Resp 18   Ht 5' (1.524 m) Comment: By family  Wt 43.5 kg Comment: by family  SpO2 100%   BMI 18.75 kg/m   Palliative Assessment/Data: TBD    Advanced Care Planning:   Existing Vynca/ACP Documentation: DNR effective 09/15/2022 Goals of care document completed/signed on 08/31/2023  Primary Decision Maker: NEXT OF KIN  Code Status/Advance Care Planning: DNR-limited  A discussion was had today regarding advanced directives. Concepts specific to code status, artifical feeding and hydration, continued IV antibiotics and rehospitalization was had.  The difference between a aggressive medical intervention path and a palliative comfort care path for this patient at this time was had.   Decisions/Changes to ACP: None today  Assessment & Plan:   Impression: 78 year old female with acute presentation chronic comorbidities as described above.  The  patient is end-stage renal disease with stage V chronic kidney disease and has previously and repeatedly refused dialysis.  At this point she is uremic and likely facing/approaching end-of-life.  Family apparently previously discussed outpatient hospice but this was put on hold at some point.  At this time they are agreeable to ongoing conversations related to how to proceed.  We have settled to meet tomorrow at 1:00 by phone/FaceTime.  Per discussions, patient does not wish to be part of the conversations.  Overall prognosis poor.  SUMMARY OF RECOMMENDATIONS   DNR-limited Full scope of care otherwise Family meeting tomorrow at 1:00 Further recommendations to follow family meeting Palliative medicine continue to follow  Symptom Management:  Per primary team PMT is available to assist as needed  Prognosis:  Unable to determine  Discharge Planning:  To Be Determined   Discussed with: Patient, family, medical team, nursing team    Thank you for allowing us  to participate in the care of Alisen D January PMT will continue to support holistically.  Time Total: 79 min  Detailed review of medical records (labs, imaging, vital signs), medically appropriate exam, discussed with treatment team, counseling and education to patient, family, & staff, documenting clinical information, medication management, coordination of care  Signed by: Lizbeth Right, NP Palliative Medicine Team  Team Phone # [phone] (Nights/Weekends)  03/27/2024, 2:40 PM

## 2024-03-27 NOTE — Progress Notes (Signed)
 Patient has stoma from previous trach.

## 2024-03-27 NOTE — Care Management Obs Status (Signed)
 MEDICARE OBSERVATION STATUS NOTIFICATION   Patient Details  Name: [name] MRN: [id number] Date of Birth: [birth year]   Medicare Observation Status Notification Given:  Yes    Jannine Meo, RN 03/27/2024, 3:46 PM

## 2024-03-27 NOTE — Plan of Care (Signed)

## 2024-03-28 ENCOUNTER — Encounter (HOSPITAL_COMMUNITY): Payer: Self-pay | Admitting: Internal Medicine

## 2024-03-28 DIAGNOSIS — Z515 Encounter for palliative care: Secondary | ICD-10-CM | POA: Diagnosis not present

## 2024-03-28 DIAGNOSIS — I5032 Chronic diastolic (congestive) heart failure: Secondary | ICD-10-CM | POA: Diagnosis not present

## 2024-03-28 DIAGNOSIS — I480 Paroxysmal atrial fibrillation: Secondary | ICD-10-CM | POA: Diagnosis not present

## 2024-03-28 DIAGNOSIS — N185 Chronic kidney disease, stage 5: Secondary | ICD-10-CM | POA: Diagnosis not present

## 2024-03-28 DIAGNOSIS — Z7189 Other specified counseling: Secondary | ICD-10-CM | POA: Diagnosis not present

## 2024-03-28 DIAGNOSIS — Z9002 Acquired absence of larynx: Secondary | ICD-10-CM | POA: Diagnosis not present

## 2024-03-28 DIAGNOSIS — D649 Anemia, unspecified: Secondary | ICD-10-CM

## 2024-03-28 DIAGNOSIS — Z66 Do not resuscitate: Secondary | ICD-10-CM | POA: Diagnosis not present

## 2024-03-28 DIAGNOSIS — R627 Adult failure to thrive: Secondary | ICD-10-CM | POA: Diagnosis not present

## 2024-03-28 DIAGNOSIS — E875 Hyperkalemia: Secondary | ICD-10-CM | POA: Diagnosis not present

## 2024-03-28 LAB — CBC WITH DIFFERENTIAL/PLATELET
Abs Immature Granulocytes: 0.08 10*3/uL — ABNORMAL HIGH (ref 0.00–0.07)
Basophils Absolute: 0 10*3/uL (ref 0.0–0.1)
Basophils Relative: 0 %
Eosinophils Absolute: 0.1 10*3/uL (ref 0.0–0.5)
Eosinophils Relative: 1 %
HCT: 22.5 % — ABNORMAL LOW (ref 36.0–46.0)
Hemoglobin: 7.6 g/dL — ABNORMAL LOW (ref 12.0–15.0)
Immature Granulocytes: 1 %
Lymphocytes Relative: 16 %
Lymphs Abs: 1.3 10*3/uL (ref 0.7–4.0)
MCH: 28.7 pg (ref 26.0–34.0)
MCHC: 33.8 g/dL (ref 30.0–36.0)
MCV: 84.9 fL (ref 80.0–100.0)
Monocytes Absolute: 0.5 10*3/uL (ref 0.1–1.0)
Monocytes Relative: 6 %
Neutro Abs: 6 10*3/uL (ref 1.7–7.7)
Neutrophils Relative %: 76 %
Platelets: 159 10*3/uL (ref 150–400)
RBC: 2.65 MIL/uL — ABNORMAL LOW (ref 3.87–5.11)
RDW: 17.9 % — ABNORMAL HIGH (ref 11.5–15.5)
WBC: 7.9 10*3/uL (ref 4.0–10.5)
nRBC: 0 % (ref 0.0–0.2)

## 2024-03-28 LAB — RENAL FUNCTION PANEL
Albumin: 2.7 g/dL — ABNORMAL LOW (ref 3.5–5.0)
Anion gap: 12 (ref 5–15)
BUN: 111 mg/dL — ABNORMAL HIGH (ref 8–23)
CO2: 20 mmol/L — ABNORMAL LOW (ref 22–32)
Calcium: 9 mg/dL (ref 8.9–10.3)
Chloride: 104 mmol/L (ref 98–111)
Creatinine, Ser: 7.8 mg/dL — ABNORMAL HIGH (ref 0.44–1.00)
GFR, Estimated: 5 mL/min — ABNORMAL LOW (ref >60)
Glucose, Bld: 109 mg/dL — ABNORMAL HIGH (ref 70–99)
Phosphorus: 3.5 mg/dL (ref 2.5–4.6)
Potassium: 4.8 mmol/L (ref 3.5–5.1)
Sodium: 136 mmol/L (ref 135–145)

## 2024-03-28 MED ORDER — SODIUM ZIRCONIUM CYCLOSILICATE 10 G PO PACK
10.0000 g | PACK | Freq: Two times a day (BID) | ORAL | Status: DC
  Administered 2024-03-28: 10 g via ORAL
  Filled 2024-03-28: qty 1

## 2024-03-28 MED ORDER — MUPIROCIN CALCIUM 2 % EX CREA
TOPICAL_CREAM | Freq: Every day | CUTANEOUS | Status: DC
  Filled 2024-03-28: qty 15

## 2024-03-28 MED ORDER — CLONIDINE HCL 0.2 MG PO TABS
0.2000 mg | ORAL_TABLET | Freq: Three times a day (TID) | ORAL | Status: DC
  Administered 2024-03-28 (×3): 0.2 mg via ORAL
  Filled 2024-03-28 (×3): qty 1

## 2024-03-28 NOTE — Plan of Care (Signed)

## 2024-03-28 NOTE — TOC Progression Note (Signed)
 Transition of Care [facility]) - Progression Note    Patient Details  Name: [name] MRN: [id number] Date of Birth: 01-05-1946  [facility] CM/SW Contact  Eusebio High, RN Phone Number: 03/28/2024, 1:52 PM  Clinical Narrative:     RNCM was notified bt Palliative care NP that family has chosen [facility] and to DC patient tomorrow to home. RNCM has notified Cheri of [facility] that she was added to a secure chat. RNCM has also requested to be updated on necessary DME delivery. Anticipating Patient will DC to home tomorrow .          Expected Discharge Plan and Services                                               Social Determinants of Health (SDOH) Interventions SDOH Screenings   Food Insecurity: No Food Insecurity (03/27/2024)  Housing: Low Risk  (03/27/2024)  Transportation Needs: No Transportation Needs (03/27/2024)  Utilities: Not At Risk (03/27/2024)  Alcohol Screen: Low Risk  (10/23/2022)  Depression (PHQ2-9): Low Risk  (07/12/2023)  Financial Resource Strain: Low Risk  (10/23/2022)  Physical Activity: Inactive (10/23/2022)  Social Connections: Socially Isolated (03/27/2024)  Stress: No Stress Concern Present (10/23/2022)  Tobacco Use: Medium Risk (03/28/2024)    Readmission Risk Interventions    09/07/2023    1:20 PM 04/07/2023   12:23 PM 10/30/2022    4:48 PM  Readmission Risk Prevention Plan  Transportation Screening Complete Complete Complete  Medication Review (RN Care Manager) Referral to Pharmacy Complete Complete  PCP or Specialist appointment within 3-5 days of discharge Complete  --  HRI or Home Care Consult Complete Complete Complete  SW Recovery Care/Counseling Consult Complete Complete Complete  Palliative Care Screening Not Applicable Not Applicable Complete  Skilled Nursing Facility Not Applicable Not Applicable Not Applicable

## 2024-03-28 NOTE — Consult Note (Signed)
 WOC Nurse Consult Note: Reason for Consult: Consult requested for left foot.  Anterior foot with full thickness wound, 100% eschar, removes easily after soaked, revealing red dry wound, 3X2X.2cm.  Dressing procedure/placement/frequency: Topical treatment orders provided for bedside nurses to perform as follows to promote moist healing: Apply Bactroban to left foot Q day, then cover with foam dressing.  Change foam dressing Q 3 days or PRN soiling. Please re-consult if further assistance is needed.  Thank-you,  Wiliam Harder MSN, RN, CWOCN, [place], CNS [phone]

## 2024-03-28 NOTE — Progress Notes (Signed)
 Daily Progress Note   Patient Name: [name]       Date: 03/28/2024 DOB: 1946-06-23  Age: 78 y.o. MRN#: [id number] Attending Physician: Burton Casey, MD Primary Care Physician: Adelia Homestead, MD Admit Date: 03/26/2024 Length of Stay: 0 days  Reason for Consultation/Follow-up: Establishing goals of care  HPI/Patient Profile:  78 y.o. female  with past medical history of laryngeal cancer status post laryngectomy and decannulated tracheostomy with stoma, CKD stage V, paroxysmal A-fib on Eliquis, chronic HFpEF, CAD, PAD, history of CVA, hypertension, anemia of chronic disease, hypothyroidism, depression/anxiety admitted with hyperkalemia and acute on chronic anemia in the setting of CKD stage V.   Palliative medicine was consulted for GOC conversations.  Subjective:   Subjective: Chart Reviewed. Updates received. Patient Assessed. Created space and opportunity for patient  and family to explore thoughts and feelings regarding current medical situation.  Today's Discussion: Today saw the patient at bedside and with the assistance of a translator device completed the visit.  She is having some pain in her left lower foot, which is known to have a wound per the patient's daughter yesterday.  I examined and appears to be a half dollar size wound that is black in color, no weeping or drainage.  She describes it hurting her this morning.  She denies shortness of breath, nausea, vomiting, or other complaints at this time.  I shared that I would request WOC nurse to come evaluate her foot and the nurse to provide something for pain.  I asked her if it was okay if I called and spoke with her sister-in-law Camilo Cella about her care and she agreed.  I asked her if it is okay for Camilo Cella to decide how it is best to take care of her moving forward and she again agreed.  I told her that I would come back and check on her tomorrow and she is in agreement with this.  Later in the day, at 1:00  in the afternoon as agreed yesterday, I attempted to call the patient's daughter Camilo Cella.  I left a voicemail requesting callback and informed her I would try again later as well.  From our conversation yesterday she is caring for her children at home and may have become tied up momentarily.  Today around 49 I called and spoke with the patient's son Veryl Gottron and daughter-in-law Jessica.  They state that they previously discussed hospice care with her nephrologist but it was held because she was doing better.  Tertiary center with palliative care but the patient refused a nursing home.  However on Friday when she was feeling worse she agreed to allow family to bring in a nurse to help care for her.  They describe her as very strong-willed.  Son and daughter-in-law expressed significant frustration related to over 1 year of being told that she "only has days to weeks to live" which has been causing a lot of anxiety.  They are very clear and understand that medically she is not doing well and her kidneys are failing.  However, it has been a continuous focus on "doom and gloom" over the past year which is really upset them.  They know that she is not well and getting very skinny, losing weight.  They prefer to focus on quality of life and enjoyment rather than sitting on the edge waiting for her to die.  I took this opportunity to explain hospice services to them. I described hospice as a service for patients who have  a life expectancy of 6 months or less. The goal of hospice is the preservation of dignity and quality at the end phases of life. Under hospice care, the focus changes from curative to symptom relief. I explained the three setting where hospice services can be provided including the home, at a living facility (such as LTC SNF, Assisted Living, etc), and a hospice facility.  They are adamant about bringing her home for hospice care as a preferred location, but would be agreeable to a hospice facility if her  stay became so severe that they could not care for her at home.  They agreed to the philosophy of focusing on quality of life for however much time is left, rather than focusing on quantifying how much time would be left.  This in part is due to a strong faith and belief that miracles happen in people can outlive expectations.  I explained that I would reach out to social work and put in a consult to refer to hospice services.  I offered him a choice and they requested hospice to the [facility] for home hospice services.  I shared that somebody from hospice should be reaching out to them shortly to explain services in detail and allow consent for services.  They are hopeful that she may be able to discharge home with hospice services tomorrow as the patient has been requesting to go home.  I shared that I would check in on the patient tomorrow. I provided emotional and general support through therapeutic listening, empathy, sharing of stories, and other techniques. I answered all questions and addressed all concerns to the best of my ability.  After my phone call with the patient's family I reached out and debriefed with the medical team and [facility] team.  I also spoke with the [facility] liaison and explained the family situation and their goals for moving forward with a focus on quality rather than focusing on quantity.  Review of Systems  Respiratory:  Negative for chest tightness and shortness of breath.   Cardiovascular:  Negative for chest pain.  Gastrointestinal:  Negative for abdominal pain, nausea and vomiting.  Musculoskeletal:        Left foot pain    Objective:   Vital Signs:  BP (!) 170/92   Pulse 91   Temp 98.4 F (36.9 C) (Oral)   Resp (!) 22   Ht 5' (1.524 m) Comment: By family  Wt 43.5 kg Comment: by family  SpO2 99%   BMI 18.75 kg/m   Physical Exam Vitals and nursing note reviewed.  Constitutional:      General: She is not in acute distress.    Appearance:  She is ill-appearing.  HENT:     Head: Normocephalic and atraumatic.     Mouth/Throat:     Comments: Noted trach stoma, difficult to understand because of this Cardiovascular:     Rate and Rhythm: Normal rate.  Pulmonary:     Effort: Pulmonary effort is normal. No respiratory distress.     Breath sounds: No wheezing or rhonchi.  Abdominal:     General: Abdomen is flat. Bowel sounds are normal. There is no distension.     Palpations: Abdomen is soft.  Skin:    General: Skin is warm and dry.  Neurological:     General: No focal deficit present.     Mental Status: She is alert and oriented to person, place, and time.  Psychiatric:  Mood and Affect: Mood normal.        Behavior: Behavior normal.     Palliative Assessment/Data: 40-50%    Existing Vynca/ACP Documentation: DNR effective 09/15/2022 Goals of care document signed 08/31/2023  Assessment & Plan:   Impression: Present on Admission:  Hyperkalemia  Paroxysmal atrial fibrillation (HCC)  Coronary artery disease involving native coronary artery of native heart with refractory angina pectoris (HCC)  CKD (chronic kidney disease) stage 5, GFR less than 15 ml/min (HCC)  Hypothyroidism  Chronic diastolic (congestive) heart failure (HCC)  Acute on chronic anemia  Depression with anxiety  78 year old female with acute presentation chronic comorbidities as described above. The patient is end-stage renal disease with stage V chronic kidney disease and has previously and repeatedly refused dialysis. At this point she is uremic and likely facing/approaching end-of-life. Family apparently previously discussed outpatient hospice but this was put on hold at some point. At this time they are agreeable to ongoing conversations related to how to proceed. Per discussions, patient does not wish to be part of the conversations, agreeable to daughter-in-law Jessica deciding on how to proceed. Overall prognosis poor.   SUMMARY OF  RECOMMENDATIONS   DNR-limited TOC consult for referral to home hospice with [facility] Palliative medicine will check in tomorrow and continue to follow  Symptom Management:  Per primary team PMT available to assist as needed  Code Status: DNR-limited  Prognosis: < 6 months  Discharge Planning: Home with Hospice  Discussed with: Patient, family, medical team, nursing team  Thank you for allowing us  to participate in the care of Nashalie D Rack PMT will continue to support holistically.  Time Total: 61 min  Detailed review of medical records (labs, imaging, vital signs), medically appropriate exam, discussed with treatment team, counseling and education to patient, family, & staff, documenting clinical information, medication management, coordination of care  Lizbeth Right, NP Palliative Medicine Team  Team Phone # [phone] (Nights/Weekends)  08/12/2021, 8:17 AM

## 2024-03-28 NOTE — Progress Notes (Addendum)
 PROGRESS NOTE        PATIENT DETAILS Name: [name] Age: 78 y.o. Sex: female Date of Birth: 01-23-1946 Admit Date: 03/26/2024 Admitting Physician Charlsie Quest, MD UJW:[id number], Austin Miles, MD  Brief Summary: Patient is a 78 y.o.  female with history of stage V CKD, chronic HFpEF, history of CVA, PAF on Eliquis, PAD, HTN, laryngeal cancer-s/p laryngectomy-decannulated tracheostomy with stoma-presented with poor oral intake/failure to thrive syndrome-patient was found to have hyperkalemia, worsening anemia in the setting of progressive CKD 5.  She was subsequently admitted to the hospitalist service-she has previously refused dialysis-ongoing goals of care conversation with patient/family.  Significant events: 4/13>> admit to [facility]  Significant studies: 4/13>> CXR: No pneumonia 4/13>> CT head: No acute abnormalities  Significant microbiology data: None  Procedures: None  Consults: Palliative care.  Subjective: Lying comfortably in bed-denies any chest pain or shortness of breath.  No nausea, vomiting or shortness of breath.  Continues to indicate to me this morning that she would not want hemodialysis.  Objective: Vitals: Blood pressure (!) 170/92, pulse 91, temperature 98.9 F (37.2 C), temperature source Oral, resp. rate (!) 22, height 5' (1.524 m), weight 43.5 kg, SpO2 99%.   Exam: Gen Exam:Alert awake-not in any distress.  Frail/chronically sick appearing. HEENT:atraumatic, normocephalic.  Tracheostomy stoma in place. Chest: B/L clear to auscultation anteriorly CVS:S1S2 regular Abdomen:soft non tender, non distended Extremities:no edema Neurology: Non focal Skin: no rash  Pertinent Labs/Radiology:    Latest Ref Rng & Units 03/28/2024    3:49 AM 03/27/2024    3:43 AM 03/26/2024    4:16 PM  CBC  WBC 4.0 - 10.5 K/uL 7.9  9.3  7.8   Hemoglobin 12.0 - 15.0 g/dL 7.6  8.5  6.6   Hematocrit 36.0 - 46.0 % 22.5  25.1  20.5   Platelets  150 - 400 K/uL 159  179  223     Lab Results  Component Value Date   NA 136 03/28/2024   K 4.8 03/28/2024   CL 104 03/28/2024   CO2 20 (L) 03/28/2024      Assessment/Plan: Hyperkalemia Secondary to progressive CKD 5 Resolved with temporizing measures Continue to maintain on Lokelma-as high risk for recurrence of hyperkalemia.  CKD 5 Progressive-nearing ESRD Still making urine but with hyperkalemia Has declined dialysis in the past-continues to indicate that she would not want hemodialysis-understands consequences. Palliative care following-family meeting scheduled for later today.  Normocytic anemia Secondary to progressive CKD No indication of blood loss-denies any melena/hematochezia/hematemesis S/p 1 unit of PRBC on 4/14 Follow CBC periodically.  PAF Telemetry monitoring Coreg/Eliquis  HTN BP on the higher side Change clonidine to 3 times daily Continue current dosing of Coreg/hydralazine/amlodipine If BP continues to be elevated-add nitrates.  Chronic HFpEF Euvolemic Diuretics as needed  CAD Currently without any anginal symptoms Continue Eliquis/Coreg/statin  Troponin elevation Demand ischemia-flat trend Given progressive CKD-desire not to initiate HD-not a candidate for aggressive care including LHC  History of laryngeal cancer-s/p laryngectomy-decannulated tracheotomy with stoma Supportive care  Hypothyroidism Synthroid  Mood disorder Appears relatively stable Remeron/Cymbalta/Ativan  Anorexia/failure to thrive syndrome/weakness Felt to be likely due to developing uremia-BUN 128 on initial presentation  Palliative care DNR in place Seems to have developed progressive CKD 5 with hyperkalemia-close to ESRD-does not desire dialysis Goals of care meeting with family later today by palliative care  team Appropriate for home hospice.  Underweight: Estimated body mass index is 18.75 kg/m as calculated from the following:   Height as of this  encounter: 5' (1.524 m).   Weight as of this encounter: 43.5 kg.   Code status:   Code Status: Limited: Do not attempt resuscitation (DNR) -DNR-LIMITED -Do Not Intubate/DNI    DVT Prophylaxis: heparin injection 5,000 Units Start: 03/27/24 2200 SCDs Start: 03/26/24 1922    Family Communication: None at bedside   Disposition Plan: Status is: Observation The patient will require care spanning > 2 midnights and should be moved to inpatient because: Severity of illness   Planned Discharge Destination:Hospice care   Diet: Diet Order             Diet renal with fluid restriction Fluid restriction: 1200 mL Fluid; Room service appropriate? Yes; Fluid consistency: Thin  Diet effective now                     Antimicrobial agents: Anti-infectives (From admission, onward)    None        MEDICATIONS: Scheduled Meds:  amLODipine  10 mg Oral Daily   carvedilol  25 mg Oral BID WC   cloNIDine  0.2 mg Oral BID   cyclobenzaprine  5 mg Oral QHS   DULoxetine  30 mg Oral Daily   heparin injection (subcutaneous)  5,000 Units Subcutaneous Q8H   hydrALAZINE  100 mg Oral TID   levothyroxine  50 mcg Oral Q0600   LORazepam  1 mg Oral BID   mirtazapine  30 mg Oral QHS   pantoprazole  40 mg Oral Daily   rosuvastatin  20 mg Oral Daily   sodium bicarbonate  650 mg Oral TID   sodium chloride flush  3 mL Intravenous Q12H   sodium zirconium cyclosilicate  10 g Oral BID   Continuous Infusions: PRN Meds:.acetaminophen **OR** acetaminophen, bisacodyl, hydrALAZINE, neomycin-bacitracin-polymyxin, nitroGLYCERIN, ondansetron **OR** ondansetron (ZOFRAN) IV, senna-docusate, traMADol   I have personally reviewed following labs and imaging studies  LABORATORY DATA: CBC: Recent Labs  Lab 03/26/24 1616 03/27/24 0343 03/28/24 0349  WBC 7.8 9.3 7.9  NEUTROABS  --   --  6.0  HGB 6.6* 8.5* 7.6*  HCT 20.5* 25.1* 22.5*  MCV 84.0 84.5 84.9  PLT 223 179 159    Basic Metabolic  Panel: Recent Labs  Lab 03/26/24 1616 03/26/24 2131 03/27/24 0343 03/28/24 0349  NA 135 138 137 136  K 6.3* 5.3* 5.1 4.8  CL 102 106 104 104  CO2 19* 17* 16* 20*  GLUCOSE 98 70 92 109*  BUN 128* 122* 121* 111*  CREATININE 8.22* 7.95* 7.95* 7.80*  CALCIUM 10.1 10.2 9.8 9.0  PHOS  --   --   --  3.5    GFR: Estimated Creatinine Clearance: 4.1 mL/min (A) (by C-G formula based on SCr of 7.8 mg/dL (H)).  Liver Function Tests: Recent Labs  Lab 03/28/24 0349  ALBUMIN 2.7*   No results for input(s): "LIPASE", "AMYLASE" in the last 168 hours. Recent Labs  Lab 03/26/24 1616  AMMONIA <10    Coagulation Profile: No results for input(s): "INR", "PROTIME" in the last 168 hours.  Cardiac Enzymes: No results for input(s): "CKTOTAL", "CKMB", "CKMBINDEX", "TROPONINI" in the last 168 hours.  BNP (last 3 results) No results for input(s): "PROBNP" in the last 8760 hours.  Lipid Profile: No results for input(s): "CHOL", "HDL", "LDLCALC", "TRIG", "CHOLHDL", "LDLDIRECT" in the last 72 hours.  Thyroid Function Tests: No  results for input(s): "TSH", "T4TOTAL", "FREET4", "T3FREE", "THYROIDAB" in the last 72 hours.  Anemia Panel: Recent Labs    03/26/24 2022  VITAMINB12 1,126*  FOLATE 7.6  FERRITIN 1,513*  TIBC 197*  IRON 44    Urine analysis:    Component Value Date/Time   COLORURINE YELLOW 03/26/2024 1546   APPEARANCEUR CLEAR 03/26/2024 1546   LABSPEC 1.008 03/26/2024 1546   PHURINE 8.0 03/26/2024 1546   GLUCOSEU 50 (A) 03/26/2024 1546   GLUCOSEU NEGATIVE 08/11/2021 0942   HGBUR NEGATIVE 03/26/2024 1546   BILIRUBINUR NEGATIVE 03/26/2024 1546   BILIRUBINUR Negative 01/19/2019 1510   KETONESUR NEGATIVE 03/26/2024 1546   PROTEINUR >=300 (A) 03/26/2024 1546   UROBILINOGEN 0.2 08/11/2021 0942   NITRITE NEGATIVE 03/26/2024 1546   LEUKOCYTESUR NEGATIVE 03/26/2024 1546    Sepsis Labs: Lactic Acid, Venous    Component Value Date/Time   LATICACIDVEN 0.6 04/06/2023 1203     MICROBIOLOGY: No results found for this or any previous visit (from the past 240 hours).  RADIOLOGY STUDIES/RESULTS: CT Head Wo Contrast Result Date: 03/26/2024 CLINICAL DATA:  Headache EXAM: CT HEAD WITHOUT CONTRAST TECHNIQUE: Contiguous axial images were obtained from the base of the skull through the vertex without intravenous contrast. RADIATION DOSE REDUCTION: This exam was performed according to the departmental dose-optimization program which includes automated exposure control, adjustment of the mA and/or kV according to patient size and/or use of iterative reconstruction technique. COMPARISON:  Head CT 10/28/2022. FINDINGS: Brain: No evidence of acute infarction, hemorrhage, hydrocephalus, extra-axial collection or mass lesion/mass effect. Mild diffuse atrophy and mild periventricular white matter hypodensity appears similar to the prior study. Vascular: Atherosclerotic calcifications are present within the cavernous internal carotid arteries. Skull: Skip there is near complete opacification of the right maxillary sinus with air-fluid level present. Mastoid air cells are clear. Orbits are within normal limits. Sinuses/Orbits: No acute finding. Other: None. IMPRESSION: 1. No acute intracranial process. 2. Acute right maxillary sinus disease. Electronically Signed   By: Tyron Gallon M.D.   On: 03/26/2024 17:19   DG Chest Port 1 View Result Date: 03/26/2024 CLINICAL DATA:  Shortness of breath EXAM: PORTABLE CHEST 1 VIEW COMPARISON:  09/05/2023 FINDINGS: Heart and mediastinal contours are within normal limits. No focal opacities or effusions. No acute bony abnormality. Aortic atherosclerosis. IMPRESSION: No active disease. Electronically Signed   By: Janeece Mechanic M.D.   On: 03/26/2024 17:04     LOS: 0 days   Kimberly Penna, MD  [facility]    To contact the attending provider between 7A-7P or the covering provider during after hours 7P-7A, please log into the web site  [email or web address] and access using universal [place] password for that web site. If you do not have the password, please call the hospital operator.  03/28/2024, 8:17 AM

## 2024-03-29 ENCOUNTER — Telehealth (HOSPITAL_COMMUNITY): Payer: Self-pay | Admitting: Pharmacy Technician

## 2024-03-29 ENCOUNTER — Other Ambulatory Visit (HOSPITAL_COMMUNITY): Payer: Self-pay

## 2024-03-29 ENCOUNTER — Encounter (HOSPITAL_COMMUNITY): Payer: Self-pay

## 2024-03-29 DIAGNOSIS — Z8542 Personal history of malignant neoplasm of other parts of uterus: Secondary | ICD-10-CM | POA: Diagnosis not present

## 2024-03-29 DIAGNOSIS — I480 Paroxysmal atrial fibrillation: Secondary | ICD-10-CM | POA: Diagnosis not present

## 2024-03-29 DIAGNOSIS — E7800 Pure hypercholesterolemia, unspecified: Secondary | ICD-10-CM | POA: Diagnosis present

## 2024-03-29 DIAGNOSIS — D638 Anemia in other chronic diseases classified elsewhere: Secondary | ICD-10-CM | POA: Diagnosis not present

## 2024-03-29 DIAGNOSIS — E872 Acidosis, unspecified: Secondary | ICD-10-CM | POA: Diagnosis present

## 2024-03-29 DIAGNOSIS — Z7189 Other specified counseling: Secondary | ICD-10-CM | POA: Diagnosis not present

## 2024-03-29 DIAGNOSIS — I132 Hypertensive heart and chronic kidney disease with heart failure and with stage 5 chronic kidney disease, or end stage renal disease: Secondary | ICD-10-CM | POA: Diagnosis present

## 2024-03-29 DIAGNOSIS — Z9002 Acquired absence of larynx: Secondary | ICD-10-CM | POA: Diagnosis not present

## 2024-03-29 DIAGNOSIS — R569 Unspecified convulsions: Secondary | ICD-10-CM | POA: Diagnosis not present

## 2024-03-29 DIAGNOSIS — Z8673 Personal history of transient ischemic attack (TIA), and cerebral infarction without residual deficits: Secondary | ICD-10-CM | POA: Diagnosis not present

## 2024-03-29 DIAGNOSIS — Z789 Other specified health status: Secondary | ICD-10-CM

## 2024-03-29 DIAGNOSIS — I25112 Atherosclerotic heart disease of native coronary artery with refractory angina pectoris: Secondary | ICD-10-CM | POA: Diagnosis present

## 2024-03-29 DIAGNOSIS — Z7401 Bed confinement status: Secondary | ICD-10-CM | POA: Diagnosis not present

## 2024-03-29 DIAGNOSIS — Z7901 Long term (current) use of anticoagulants: Secondary | ICD-10-CM | POA: Diagnosis not present

## 2024-03-29 DIAGNOSIS — D630 Anemia in neoplastic disease: Secondary | ICD-10-CM | POA: Diagnosis present

## 2024-03-29 DIAGNOSIS — Z923 Personal history of irradiation: Secondary | ICD-10-CM | POA: Diagnosis not present

## 2024-03-29 DIAGNOSIS — F32A Depression, unspecified: Secondary | ICD-10-CM | POA: Diagnosis not present

## 2024-03-29 DIAGNOSIS — I5032 Chronic diastolic (congestive) heart failure: Secondary | ICD-10-CM | POA: Diagnosis not present

## 2024-03-29 DIAGNOSIS — E039 Hypothyroidism, unspecified: Secondary | ICD-10-CM | POA: Diagnosis not present

## 2024-03-29 DIAGNOSIS — Z79890 Hormone replacement therapy (postmenopausal): Secondary | ICD-10-CM | POA: Diagnosis not present

## 2024-03-29 DIAGNOSIS — I739 Peripheral vascular disease, unspecified: Secondary | ICD-10-CM | POA: Diagnosis not present

## 2024-03-29 DIAGNOSIS — Z66 Do not resuscitate: Secondary | ICD-10-CM | POA: Diagnosis not present

## 2024-03-29 DIAGNOSIS — Z515 Encounter for palliative care: Secondary | ICD-10-CM | POA: Diagnosis not present

## 2024-03-29 DIAGNOSIS — S91302S Unspecified open wound, left foot, sequela: Secondary | ICD-10-CM | POA: Diagnosis not present

## 2024-03-29 DIAGNOSIS — Z681 Body mass index (BMI) 19 or less, adult: Secondary | ICD-10-CM | POA: Diagnosis not present

## 2024-03-29 DIAGNOSIS — R636 Underweight: Secondary | ICD-10-CM | POA: Diagnosis present

## 2024-03-29 DIAGNOSIS — Z930 Tracheostomy status: Secondary | ICD-10-CM | POA: Diagnosis not present

## 2024-03-29 DIAGNOSIS — N186 End stage renal disease: Secondary | ICD-10-CM | POA: Diagnosis not present

## 2024-03-29 DIAGNOSIS — J9503 Malfunction of tracheostomy stoma: Secondary | ICD-10-CM | POA: Diagnosis not present

## 2024-03-29 DIAGNOSIS — N185 Chronic kidney disease, stage 5: Secondary | ICD-10-CM | POA: Diagnosis not present

## 2024-03-29 DIAGNOSIS — Z9181 History of falling: Secondary | ICD-10-CM | POA: Diagnosis not present

## 2024-03-29 DIAGNOSIS — I2489 Other forms of acute ischemic heart disease: Secondary | ICD-10-CM | POA: Diagnosis present

## 2024-03-29 DIAGNOSIS — D649 Anemia, unspecified: Secondary | ICD-10-CM | POA: Diagnosis not present

## 2024-03-29 DIAGNOSIS — R627 Adult failure to thrive: Secondary | ICD-10-CM | POA: Diagnosis not present

## 2024-03-29 DIAGNOSIS — I252 Old myocardial infarction: Secondary | ICD-10-CM | POA: Diagnosis not present

## 2024-03-29 DIAGNOSIS — Z87891 Personal history of nicotine dependence: Secondary | ICD-10-CM | POA: Diagnosis not present

## 2024-03-29 DIAGNOSIS — E875 Hyperkalemia: Secondary | ICD-10-CM | POA: Diagnosis not present

## 2024-03-29 DIAGNOSIS — I2510 Atherosclerotic heart disease of native coronary artery without angina pectoris: Secondary | ICD-10-CM | POA: Diagnosis not present

## 2024-03-29 DIAGNOSIS — F419 Anxiety disorder, unspecified: Secondary | ICD-10-CM | POA: Diagnosis present

## 2024-03-29 DIAGNOSIS — Z8521 Personal history of malignant neoplasm of larynx: Secondary | ICD-10-CM | POA: Diagnosis not present

## 2024-03-29 DIAGNOSIS — D631 Anemia in chronic kidney disease: Secondary | ICD-10-CM | POA: Diagnosis present

## 2024-03-29 LAB — RENAL FUNCTION PANEL
Albumin: 2.8 g/dL — ABNORMAL LOW (ref 3.5–5.0)
Anion gap: 14 (ref 5–15)
BUN: 107 mg/dL — ABNORMAL HIGH (ref 8–23)
CO2: 19 mmol/L — ABNORMAL LOW (ref 22–32)
Calcium: 8.7 mg/dL — ABNORMAL LOW (ref 8.9–10.3)
Chloride: 100 mmol/L (ref 98–111)
Creatinine, Ser: 8.15 mg/dL — ABNORMAL HIGH (ref 0.44–1.00)
GFR, Estimated: 5 mL/min — ABNORMAL LOW (ref >60)
Glucose, Bld: 105 mg/dL — ABNORMAL HIGH (ref 70–99)
Phosphorus: 3.6 mg/dL (ref 2.5–4.6)
Potassium: 4.7 mmol/L (ref 3.5–5.1)
Sodium: 133 mmol/L — ABNORMAL LOW (ref 135–145)

## 2024-03-29 MED ORDER — TORSEMIDE 20 MG PO TABS
80.0000 mg | ORAL_TABLET | Freq: Every day | ORAL | 0 refills | Status: DC
  Filled 2024-03-29: qty 120, 30d supply, fill #0

## 2024-03-29 MED ORDER — HYDROMORPHONE HCL 1 MG/ML IJ SOLN
1.0000 mg | INTRAMUSCULAR | Status: DC | PRN
  Administered 2024-03-29: 1 mg via INTRAVENOUS
  Filled 2024-03-29: qty 1

## 2024-03-29 MED ORDER — OXYCODONE HCL 5 MG/5ML PO SOLN
5.0000 mg | ORAL | 0 refills | Status: DC | PRN
  Filled 2024-03-29: qty 100, 2d supply, fill #0

## 2024-03-29 MED ORDER — ISOSORBIDE MONONITRATE ER 30 MG PO TB24
30.0000 mg | ORAL_TABLET | Freq: Every day | ORAL | Status: DC
  Filled 2024-03-29: qty 1

## 2024-03-29 MED ORDER — PROCHLORPERAZINE EDISYLATE 10 MG/2ML IJ SOLN
5.0000 mg | Freq: Once | INTRAMUSCULAR | Status: AC
  Administered 2024-03-29: 5 mg via INTRAVENOUS
  Filled 2024-03-29: qty 2

## 2024-03-29 MED ORDER — ISOSORBIDE MONONITRATE ER 30 MG PO TB24
30.0000 mg | ORAL_TABLET | Freq: Every day | ORAL | 0 refills | Status: DC
  Filled 2024-03-29: qty 30, 30d supply, fill #0

## 2024-03-29 MED ORDER — CARVEDILOL 25 MG PO TABS
25.0000 mg | ORAL_TABLET | Freq: Two times a day (BID) | ORAL | 0 refills | Status: DC
  Filled 2024-03-29: qty 60, 30d supply, fill #0

## 2024-03-29 MED ORDER — CLONIDINE HCL 0.3 MG PO TABS
0.3000 mg | ORAL_TABLET | Freq: Three times a day (TID) | ORAL | 0 refills | Status: DC
  Filled 2024-03-29: qty 90, 30d supply, fill #0

## 2024-03-29 MED ORDER — LORAZEPAM 1 MG PO TABS
1.0000 mg | ORAL_TABLET | Freq: Four times a day (QID) | ORAL | 0 refills | Status: DC | PRN
  Filled 2024-03-29: qty 60, 15d supply, fill #0

## 2024-03-29 MED ORDER — SODIUM ZIRCONIUM CYCLOSILICATE 10 G PO PACK
10.0000 g | PACK | Freq: Two times a day (BID) | ORAL | 1 refills | Status: DC
Start: 2024-03-29 — End: 2024-04-11
  Filled 2024-03-29: qty 60, 30d supply, fill #0

## 2024-03-29 MED ORDER — HYDRALAZINE HCL 100 MG PO TABS
100.0000 mg | ORAL_TABLET | Freq: Three times a day (TID) | ORAL | 0 refills | Status: DC
  Filled 2024-03-29: qty 90, 30d supply, fill #0

## 2024-03-29 MED ORDER — ONDANSETRON 8 MG PO TBDP
8.0000 mg | ORAL_TABLET | Freq: Three times a day (TID) | ORAL | 2 refills | Status: DC | PRN
  Filled 2024-03-29: qty 20, 7d supply, fill #0

## 2024-03-29 NOTE — Telephone Encounter (Signed)
 Pharmacy Patient Advocate Encounter   Received notification from Inpatient Request that prior authorization for Lokelma 10GM packets is required/requested.   Insurance verification completed.   The patient is insured through [employer] .   Per test claim: PA required; PA submitted to above mentioned insurance via CoverMyMeds Key/confirmation #/EOC [id number] Status is pending

## 2024-03-29 NOTE — Discharge Summary (Addendum)
 PATIENT DETAILS Name: [name] Age: 78 y.o. Sex: female Date of Birth: 1945/12/24 MRN: [id number]. Admitting Physician: Maretta Bees, MD [id number], Austin Miles, MD  Admit Date: 03/26/2024 Discharge date: 03/29/2024  Recommendations for Outpatient Follow-up:  Home with hospice-patient does not desire HD.  See discussion below. Minimize/Discontinue meds when able  Admitted From:  Home  Disposition: Hospice care   Discharge Condition: poor  CODE STATUS:   Code Status: Limited: Do not attempt resuscitation (DNR) -DNR-LIMITED -Do Not Intubate/DNI    Diet recommendation:  Diet Order             Diet - low sodium heart healthy           Diet renal with fluid restriction Fluid restriction: 1200 mL Fluid; Room service appropriate? Yes; Fluid consistency: Thin  Diet effective now                    Brief Summary: Patient is a 78 y.o.  female with history of stage V CKD, chronic HFpEF, history of CVA, PAF on Eliquis, PAD, HTN, laryngeal cancer-s/p laryngectomy-decannulated tracheostomy with stoma-presented with poor oral intake/failure to thrive syndrome-patient was found to have hyperkalemia, worsening anemia in the setting of progressive CKD 5.  She was subsequently admitted to the hospitalist service-she was given 1 unit of PRBC-hyperkalemia was treated-she was subsequently admitted-had goals of care discussion with palliative care-with recommendations to discharge home with hospice.   Significant events: 4/13>> admit to [facility]   Significant studies: 4/13>> CXR: No pneumonia 4/13>> CT head: No acute abnormalities   Significant microbiology data: None   Procedures: None   Consults: Palliative care.  Brief Hospital Course: Hyperkalemia Secondary to progressive CKD 5 Resolved with temporizing measures Continue to maintain on Lokelma-as high risk for recurrence of hyperkalemia. See discussion below but patient does not desire HD-being discharged  with home hospice.   CKD 5 Progressive-nearing ESRD Still making urine but with hyperkalemia Has declined dialysis in the past-continues to indicate that she would not want hemodialysis-understands consequences. Being discharged home with hospice care.   Normocytic anemia Secondary to progressive CKD No indication of blood loss-denies any melena/hematochezia/hematemesis S/p 1 unit of PRBC on 4/14 Follow CBC periodically if deemed indicated by hospice MD/PCP.   PAF Telemetry monitoring Continue Coreg/Eliquis-as long as she is able to take medications-can be discontinued once she starts deteriorating more.   HTN BP uncontrolled-due to worsening kidney function Will increase dosage of clonidine on discharge Will add Imdur on discharge Continue diuretic on discharge Continue current doses of hydralazine/Coreg/amlodipine. Follow and optimize accordingly based on patient's clinical status.   Chronic HFpEF Euvolemic Continue torsemide on discharge-dosage adjusted slightly.   CAD Currently without any anginal symptoms Continue Eliquis/Coreg/statin   Troponin elevation Demand ischemia-flat trend Given progressive CKD-desire not to initiate HD-not a candidate for aggressive care including LHC   History of laryngeal cancer-s/p laryngectomy-decannulated tracheotomy with stoma Supportive care   Hypothyroidism Synthroid   Mood disorder Appears relatively stable Remeron/Cymbalta/Ativan   Anorexia/failure to thrive syndrome/weakness Felt to be likely due to developing uremia-BUN 128 on initial presentation   Palliative care DNR in place Seems to have developed progressive CKD 5 with hyperkalemia-close to ESRD-does not desire dialysis Goals of care meeting with family on 4/15-recommendations are to discharge home today with hospice care.     Underweight: Estimated body mass index is 18.75 kg/m as calculated from the following:   Height as of this encounter: 5' (1.524 m).  Weight as of this encounter: 43.5 kg.    Discharge Diagnoses:  Principal Problem:   Hyperkalemia Active Problems:   Acute on chronic anemia   CKD (chronic kidney disease) stage 5, GFR less than 15 ml/min (HCC)   Paroxysmal atrial fibrillation (HCC)   Hypothyroidism   S/P laryngectomy   Chronic diastolic (congestive) heart failure (HCC)   Coronary artery disease involving native coronary artery of native heart with refractory angina pectoris (HCC)   Depression with anxiety   Discharge Instructions:  Activity:  As tolerated with Full fall precautions use walker/cane & assistance as needed  Discharge Instructions     Diet - low sodium heart healthy   Complete by: As directed    Discharge instructions   Complete by: As directed    Follow with Primary MD  Myrlene Broker, MD in 1-2 weeks  Please get a complete blood count and chemistry panel checked by your Primary MD at your next visit, and again as instructed by your Primary MD.  Get Medicines reviewed and adjusted: Please take all your medications with you for your next visit with your Primary MD  Laboratory/radiological data: Please request your Primary MD to go over all hospital tests and procedure/radiological results at the follow up, please ask your Primary MD to get all Hospital records sent to his/her office.  In some cases, they will be blood work, cultures and biopsy results pending at the time of your discharge. Please request that your primary care M.D. follows up on these results.  Also Note the following: If you experience worsening of your admission symptoms, develop shortness of breath, life threatening emergency, suicidal or homicidal thoughts you must seek medical attention immediately by calling 911 or calling your MD immediately  if symptoms less severe.  You must read complete instructions/literature along with all the possible adverse reactions/side effects for all the Medicines you take and that  have been prescribed to you. Take any new Medicines after you have completely understood and accpet all the possible adverse reactions/side effects.   Do not drive when taking Pain medications or sleeping medications (Benzodaizepines)  Do not take more than prescribed Pain, Sleep and Anxiety Medications. It is not advisable to combine anxiety,sleep and pain medications without talking with your primary care practitioner  Special Instructions: If you have smoked or chewed Tobacco  in the last 2 yrs please stop smoking, stop any regular Alcohol  and or any Recreational drug use.  Wear Seat belts while driving.  Please note: You were cared for by a hospitalist during your hospital stay. Once you are discharged, your primary care physician will handle any further medical issues. Please note that NO REFILLS for any discharge medications will be authorized once you are discharged, as it is imperative that you return to your primary care physician (or establish a relationship with a primary care physician if you do not have one) for your post hospital discharge needs so that they can reassess your need for medications and monitor your lab values.   Discharge wound care:   Complete by: As directed    Apply Bactroban to left foot Q day, then cover with foam dressing.  Change foam dressing Q 3 days or PRN soiling   Increase activity slowly   Complete by: As directed       Allergies as of 03/29/2024       Reactions   Xyzal [levocetirizine Dihydrochloride] Itching   Augmentin [amoxicillin-pot Clavulanate] Diarrhea, Nausea And Vomiting  Tribenzor [olmesartan-amlodipine-hctz] Other (See Comments)   "Hurt the kidneys"        Medication List     STOP taking these medications    acetaminophen-codeine 300-30 MG tablet Commonly known as: TYLENOL #3   aspirin EC 81 MG tablet   cloNIDine 0.1 mg/24hr patch Commonly known as: CATAPRES - Dosed in mg/24 hr   doxycycline 100 MG tablet Commonly  known as: VIBRA-TABS   levofloxacin 250 MG tablet Commonly known as: Levaquin   omeprazole 40 MG capsule Commonly known as: PRILOSEC   ondansetron 40 MG/20ML Soln injection Commonly known as: ZOFRAN   traMADol 50 MG tablet Commonly known as: ULTRAM   Veltassa 8.4 g packet Generic drug: patiromer       TAKE these medications    amLODipine 10 MG tablet Commonly known as: NORVASC TAKE 1 TABLET(10 MG) BY MOUTH DAILY What changed: See the new instructions.   apixaban 2.5 MG Tabs tablet Commonly known as: Eliquis Take 1 tablet (2.5 mg total) by mouth 2 (two) times daily.   B-D INTEGRA SYRINGE 23G X 1" 3 ML Misc Generic drug: SYRINGE-NEEDLE (DISP) 3 ML Use with zofran injections   calcitRIOL 0.25 MCG capsule Commonly known as: ROCALTROL Take 1 capsule (0.25 mcg total) by mouth daily.   carvedilol 25 MG tablet Commonly known as: COREG Take 1 tablet (25 mg total) by mouth 2 (two) times daily with a meal.   cilostazol 100 MG tablet Commonly known as: PLETAL Take 1 tablet (100 mg total) by mouth 2 (two) times daily.   cloNIDine 0.3 MG tablet Commonly known as: CATAPRES Take 1 tablet (0.3 mg total) by mouth 3 (three) times daily. What changed:  medication strength See the new instructions.   cyclobenzaprine 5 MG tablet Commonly known as: FLEXERIL Take 1 tablet (5 mg total) by mouth at bedtime.   DULoxetine 30 MG capsule Commonly known as: CYMBALTA Take 1 capsule (30 mg total) by mouth daily.   hydrALAZINE 100 MG tablet Commonly known as: APRESOLINE Take 1 tablet (100 mg total) by mouth 3 (three) times daily. What changed: when to take this   isosorbide mononitrate 30 MG 24 hr tablet Commonly known as: IMDUR Tome 1 tableta (30 mg en total) por va oral diariamente. (Take 1 tablet (30 mg total) by mouth daily.)   levothyroxine 50 MCG tablet Commonly known as: SYNTHROID Tome 1 tableta (50 mcg en total) por va oral diariamente antes de desayunar. (Take 1  tablet (50 mcg total) by mouth daily before breakfast.)   Lokelma 10 g Pack packet Generic drug: sodium zirconium cyclosilicate Take 10 g by mouth 2 (two) times daily.   loratadine 10 MG tablet Commonly known as: CLARITIN Take 10 mg by mouth in the morning and at bedtime.   LORazepam 1 MG tablet Commonly known as: ATIVAN Take 1 tablet (1 mg total) by mouth every 6 (six) hours as needed for anxiety, sedation, sleep or seizure. What changed:  when to take this reasons to take this   mirtazapine 30 MG tablet Commonly known as: REMERON Take 1 tablet (30 mg total) by mouth at bedtime.   nitroGLYCERIN 0.4 MG SL tablet Commonly known as: NITROSTAT Place 1 tablet (0.4 mg total) under the tongue every 5 (five) minutes x 3 doses as needed for chest pain.   ondansetron 8 MG disintegrating tablet Commonly known as: ZOFRAN-ODT Take 1 tablet (8 mg total) by mouth every 8 (eight) hours as needed for nausea or vomiting.   oxyCODONE 5  MG/5ML solution Commonly known as: ROXICODONE Take 5-10 mLs (5-10 mg total) by mouth every 4 (four) hours as needed for breakthrough pain (shortness of breath).   pantoprazole 40 MG tablet Commonly known as: PROTONIX Take 1 tablet (40 mg total) by mouth daily.   rosuvastatin 20 MG tablet Commonly known as: CRESTOR Take 1 tablet (20 mg total) by mouth daily.   sodium bicarbonate 650 MG tablet Take 1 tablet (650 mg total) by mouth 3 (three) times daily. What changed: when to take this   torsemide 20 MG tablet Commonly known as: DEMADEX Take 4 tablets (80 mg total) by mouth daily. What changed:  how much to take when to take this               Discharge Care Instructions  (From admission, onward)           Start     Ordered   03/29/24 0000  Discharge wound care:       Comments: Apply Bactroban to left foot Q day, then cover with foam dressing.  Change foam dressing Q 3 days or PRN soiling   03/29/24 1191            Follow-up  Information     Adelia Homestead, MD. Schedule an appointment as soon as possible for a visit in 1 week(s).   Specialty: Internal Medicine Contact information: [address] [place] [address] [phone]                Allergies  Allergen Reactions   Xyzal [Levocetirizine Dihydrochloride] Itching   Augmentin [Amoxicillin-Pot Clavulanate] Diarrhea and Nausea And Vomiting   Tribenzor [Olmesartan-Amlodipine-Hctz] Other (See Comments)    "Hurt the kidneys"     Other Procedures/Studies: CT Head Wo Contrast Result Date: 03/26/2024 CLINICAL DATA:  Headache EXAM: CT HEAD WITHOUT CONTRAST TECHNIQUE: Contiguous axial images were obtained from the base of the skull through the vertex without intravenous contrast. RADIATION DOSE REDUCTION: This exam was performed according to the departmental dose-optimization program which includes automated exposure control, adjustment of the mA and/or kV according to patient size and/or use of iterative reconstruction technique. COMPARISON:  Head CT 10/28/2022. FINDINGS: Brain: No evidence of acute infarction, hemorrhage, hydrocephalus, extra-axial collection or mass lesion/mass effect. Mild diffuse atrophy and mild periventricular white matter hypodensity appears similar to the prior study. Vascular: Atherosclerotic calcifications are present within the cavernous internal carotid arteries. Skull: Skip there is near complete opacification of the right maxillary sinus with air-fluid level present. Mastoid air cells are clear. Orbits are within normal limits. Sinuses/Orbits: No acute finding. Other: None. IMPRESSION: 1. No acute intracranial process. 2. Acute right maxillary sinus disease. Electronically Signed   By: Tyron Gallon M.D.   On: 03/26/2024 17:19   DG Chest Port 1 View Result Date: 03/26/2024 CLINICAL DATA:  Shortness of breath EXAM: PORTABLE CHEST 1 VIEW COMPARISON:  09/05/2023 FINDINGS: Heart and mediastinal contours are within normal  limits. No focal opacities or effusions. No acute bony abnormality. Aortic atherosclerosis. IMPRESSION: No active disease. Electronically Signed   By: Janeece Mechanic M.D.   On: 03/26/2024 17:04     TODAY-DAY OF DISCHARGE:  Subjective:   [name] today has no headache,no chest abdominal pain,no new weakness tingling or numbness, feels much better wants to go home today.   Objective:   Blood pressure (!) 144/68, pulse 84, temperature 98 F (36.7 C), temperature source Axillary, resp. rate 17, height 5' (1.524 m), weight 43.5 kg, SpO2 99%.  No intake or output data in the 24 hours ending 03/29/24 1332 Filed Weights   03/26/24 1515  Weight: 43.5 kg    Exam: Awake Alert, Oriented *3, No new F.N deficits, Normal affect [place].AT,PERRAL Supple Neck,No JVD, No cervical lymphadenopathy appriciated.  Symmetrical Chest wall movement, Good air movement bilaterally, CTAB RRR,No Gallops,Rubs or new Murmurs, No Parasternal Heave +ve B.Sounds, Abd Soft, Non tender, No organomegaly appriciated, No rebound -guarding or rigidity. No Cyanosis, Clubbing or edema, No new Rash or bruise   PERTINENT RADIOLOGIC STUDIES: No results found.   PERTINENT LAB RESULTS: CBC: Recent Labs    03/27/24 0343 03/28/24 0349  WBC 9.3 7.9  HGB 8.5* 7.6*  HCT 25.1* 22.5*  PLT 179 159   CMET CMP     Component Value Date/Time   NA 133 (L) 03/29/2024 0343   NA 136 (A) 08/17/2023 0000   K 4.7 03/29/2024 0343   CL 100 03/29/2024 0343   CO2 19 (L) 03/29/2024 0343   GLUCOSE 105 (H) 03/29/2024 0343   BUN 107 (H) 03/29/2024 0343   BUN 94 (A) 08/17/2023 0000   CREATININE 8.15 (H) 03/29/2024 0343   CREATININE 2.70 (H) 01/07/2021 1037   CALCIUM 8.7 (L) 03/29/2024 0343   PROT 5.9 (L) 09/03/2023 0907   ALBUMIN 2.8 (L) 03/29/2024 0343   AST 16 09/03/2023 0907   AST 15 01/07/2021 1037   ALT 8 09/03/2023 0907   ALT 21 01/07/2021 1037   ALKPHOS 66 09/03/2023 0907   BILITOT 1.0 09/03/2023 0907   BILITOT 0.3  01/07/2021 1037   GFR 8.65 (LL) 03/10/2023 1002   EGFR 5.0 09/29/2023 1312   EGFR 5 08/17/2023 0000   GFRNONAA 5 (L) 03/29/2024 0343   GFRNONAA 18 (L) 01/07/2021 1037    GFR Estimated Creatinine Clearance: 3.9 mL/min (A) (by C-G formula based on SCr of 8.15 mg/dL (H)). No results for input(s): "LIPASE", "AMYLASE" in the last 72 hours. No results for input(s): "CKTOTAL", "CKMB", "CKMBINDEX", "TROPONINI" in the last 72 hours. Invalid input(s): "POCBNP" No results for input(s): "DDIMER" in the last 72 hours. No results for input(s): "HGBA1C" in the last 72 hours. No results for input(s): "CHOL", "HDL", "LDLCALC", "TRIG", "CHOLHDL", "LDLDIRECT" in the last 72 hours. No results for input(s): "TSH", "T4TOTAL", "T3FREE", "THYROIDAB" in the last 72 hours.  Invalid input(s): "FREET3" Recent Labs    03/26/24 2022  VITAMINB12 1,126*  FOLATE 7.6  FERRITIN 1,513*  TIBC 197*  IRON 44   Coags: No results for input(s): "INR" in the last 72 hours.  Invalid input(s): "PT" Microbiology: No results found for this or any previous visit (from the past 240 hours).  FURTHER DISCHARGE INSTRUCTIONS:  Get Medicines reviewed and adjusted: Please take all your medications with you for your next visit with your Primary MD  Laboratory/radiological data: Please request your Primary MD to go over all hospital tests and procedure/radiological results at the follow up, please ask your Primary MD to get all Hospital records sent to his/her office.  In some cases, they will be blood work, cultures and biopsy results pending at the time of your discharge. Please request that your primary care M.D. goes through all the records of your hospital data and follows up on these results.  Also Note the following: If you experience worsening of your admission symptoms, develop shortness of breath, life threatening emergency, suicidal or homicidal thoughts you must seek medical attention immediately by calling 911 or  calling your MD immediately  if symptoms less severe.  You must read complete instructions/literature along with all the possible adverse reactions/side effects for all the Medicines you take and that have been prescribed to you. Take any new Medicines after you have completely understood and accpet all the possible adverse reactions/side effects.   Do not drive when taking Pain medications or sleeping medications (Benzodaizepines)  Do not take more than prescribed Pain, Sleep and Anxiety Medications. It is not advisable to combine anxiety,sleep and pain medications without talking with your primary care practitioner  Special Instructions: If you have smoked or chewed Tobacco  in the last 2 yrs please stop smoking, stop any regular Alcohol  and or any Recreational drug use.  Wear Seat belts while driving.  Please note: You were cared for by a hospitalist during your hospital stay. Once you are discharged, your primary care physician will handle any further medical issues. Please note that NO REFILLS for any discharge medications will be authorized once you are discharged, as it is imperative that you return to your primary care physician (or establish a relationship with a primary care physician if you do not have one) for your post hospital discharge needs so that they can reassess your need for medications and monitor your lab values.  Total Time spent coordinating discharge including counseling, education and face to face time equals greater than 30 minutes.  Signed: Benigna Delisi 03/29/2024 1:32 PM

## 2024-03-29 NOTE — Progress Notes (Signed)
 Daily Progress Note   Patient Name: [name]       Date: 03/29/2024 DOB: 08-18-1946  Age: 78 y.o. MRN#: [id number] Attending Physician: Burton Casey, MD Primary Care Physician: Adelia Homestead, MD Admit Date: 03/26/2024 Length of Stay: 0 days  Reason for Consultation/Follow-up: Establishing goals of care  HPI/Patient Profile:  78 y.o. female  with past medical history of laryngeal cancer status post laryngectomy and decannulated tracheostomy with stoma, CKD stage V, paroxysmal A-fib on Eliquis, chronic HFpEF, CAD, PAD, history of CVA, hypertension, anemia of chronic disease, hypothyroidism, depression/anxiety admitted with hyperkalemia and acute on chronic anemia in the setting of CKD stage V.   Palliative medicine was consulted for GOC conversations.  Subjective:   Subjective: Chart Reviewed. Updates received. Patient Assessed. Created space and opportunity for patient  and family to explore thoughts and feelings regarding current medical situation.  Today's Discussion: Today saw the patient at bedside and she is much more withdrawn. Per the patient's RN, the patient was very upset last night and was refusing medications. She's also been refusing medcations this morning. Query if she's upset about d/c plans or other thing versus if she's having more symptoms of her uremia. Plan today is for d/c to home with hospice, per discussion with family yesterday.  Per chart review WOC nurse came and assessed foot wound, provided care and recommendations.  I provided emotional and general support through therapeutic listening, empathy, sharing of stories, therapeutic touch, and other techniques. I answered all questions and addressed all concerns to the best of my ability.  After seeing the patient I spent time discussing the patient's care and situation with the hospitalist.  Less interactive today, could be start of terminal decline versus patient being upset about something.  Communication has been difficult at baseline due to stoma/laryngectomy and primary Spanish speaking. We are in agreement that home with hospice remains the optimal d/c disposition, especially after discussion with family.  Review of Systems  Unable to perform ROS Pt making eye contact but not responding to questions  Objective:   Vital Signs:  BP (!) 144/68 (BP Location: Left Arm)   Pulse 84   Temp 98 F (36.7 C) (Axillary)   Resp 17   Ht 5' (1.524 m) Comment: By family  Wt 43.5 kg Comment: by family  SpO2 99%   BMI 18.75 kg/m   Physical Exam Vitals and nursing note reviewed.  Constitutional:      General: She is not in acute distress.    Appearance: She is ill-appearing.  HENT:     Head: Normocephalic and atraumatic.     Mouth/Throat:     Comments: Noted trach stoma, difficult to understand because of this Cardiovascular:     Rate and Rhythm: Normal rate.  Pulmonary:     Effort: Pulmonary effort is normal. No respiratory distress.     Breath sounds: No wheezing or rhonchi.  Abdominal:     General: Abdomen is flat. Bowel sounds are normal. There is no distension.     Palpations: Abdomen is soft.  Skin:    General: Skin is warm and dry.  Neurological:     General: No focal deficit present.     Mental Status: She is alert and oriented to person, place, and time.  Psychiatric:        Mood and Affect: Mood normal.        Behavior: Behavior normal.     Palliative Assessment/Data: 40-50%    Existing Vynca/ACP  Documentation: DNR effective 09/15/2022 Goals of care document signed 08/31/2023  Assessment & Plan:   Impression: Present on Admission:  Hyperkalemia  Paroxysmal atrial fibrillation (HCC)  Coronary artery disease involving native coronary artery of native heart with refractory angina pectoris (HCC)  CKD (chronic kidney disease) stage 5, GFR less than 15 ml/min (HCC)  Hypothyroidism  Chronic diastolic (congestive) heart failure (HCC)  Acute on chronic  anemia  Depression with anxiety  78 year old female with acute presentation chronic comorbidities as described above. The patient is end-stage renal disease with stage V chronic kidney disease and has previously and repeatedly refused dialysis. At this point she is uremic and likely facing/approaching end-of-life. Family apparently previously discussed outpatient hospice but this was put on hold at some point. At this time they are agreeable to ongoing conversations related to how to proceed. Per discussions, patient does not wish to be part of the conversations, agreeable to daughter-in-law Jessica deciding on how to proceed. Overall prognosis poor.   SUMMARY OF RECOMMENDATIONS   DNR-limited Anticipate d/c to home with hospice today Palliative medicine will back off given pending d/c Please notify us  of significant change or new palliative needs  Symptom Management:  Per primary team PMT available to assist as needed  Code Status: DNR-limited  Prognosis: < 6 months  Discharge Planning: Home with Hospice  Discussed with: Patient, family, medical team, nursing team  Thank you for allowing us  to participate in the care of [name] PMT will continue to support holistically.  Time Total: 30 min  Detailed review of medical records (labs, imaging, vital signs), medically appropriate exam, discussed with treatment team, counseling and education to patient, family, & staff, documenting clinical information, medication management, coordination of care  Lizbeth Right, NP Palliative Medicine Team  Team Phone # [phone] (Nights/Weekends)  08/12/2021, 8:17 AM

## 2024-03-29 NOTE — TOC Transition Note (Signed)
 Transition of Care [facility]) - Discharge Note   Patient Details  Name: [name] MRN: [id number] Date of Birth: 28-Jul-1946  [facility] of [facility] CM/SW Contact:  Eusebio High, RN Phone Number: 03/29/2024, 1:21 PM   Clinical Narrative:     Patient will DC to home today with Hospice services. [facility] will be providing services in the home. DME has been delivered.to the home . Patient will be transported by [facility]             Patient Goals and CMS Choice            Discharge Placement                       Discharge Plan and Services Additional resources added to the After Visit Summary for                                       Social Drivers of Health (SDOH) Interventions SDOH Screenings   Food Insecurity: No Food Insecurity (03/27/2024)  Housing: Low Risk  (03/27/2024)  Transportation Needs: No Transportation Needs (03/27/2024)  Utilities: Not At Risk (03/27/2024)  Alcohol Screen: Low Risk  (10/23/2022)  Depression (PHQ2-9): Low Risk  (07/12/2023)  Financial Resource Strain: Low Risk  (10/23/2022)  Physical Activity: Inactive (10/23/2022)  Social Connections: Socially Isolated (03/27/2024)  Stress: No Stress Concern Present (10/23/2022)  Tobacco Use: Medium Risk (03/28/2024)     Readmission Risk Interventions    09/07/2023    1:20 PM 04/07/2023   12:23 PM 10/30/2022    4:48 PM  Readmission Risk Prevention Plan  Transportation Screening Complete Complete Complete  Medication Review (RN Care Manager) Referral to Pharmacy Complete Complete  PCP or Specialist appointment within 3-5 days of discharge Complete  --  HRI or Home Care Consult Complete Complete Complete  SW Recovery Care/Counseling Consult Complete Complete Complete  Palliative Care Screening Not Applicable Not Applicable Complete  Skilled Nursing Facility Not Applicable Not Applicable Not Applicable

## 2024-03-29 NOTE — Telephone Encounter (Signed)
 Pharmacy Patient Advocate Encounter  Received notification from [employer] that Prior Authorization for [facility] 10GM packets has been APPROVED from 03/29/2024 to 12/13/2024   PA #/Case ID/Reference #: [id number]

## 2024-03-30 ENCOUNTER — Telehealth: Payer: Self-pay | Admitting: *Deleted

## 2024-03-30 NOTE — Transitions of Care (Post Inpatient/ED Visit) (Signed)
 03/30/2024  Name: [name] MRN: [id number] DOB: [birth year]  Today's TOC FU Call Status: Today's TOC FU Call Status:: Successful TOC FU Call Completed TOC FU Call Complete Date: 03/30/24 Patient's Name and Date of Birth confirmed.  Transition Care Management Follow-up Telephone Call Date of Discharge: 03/29/24 Discharge Facility: [facility]) Type of Discharge: Inpatient Admission Primary Inpatient Discharge Diagnosis:: Failure to Thrive: verified hospice services at home are active post-hospital discharge: Full TOC call deferred accordingly  Care Coordination Outreach-- Spoke with [facility] [place]: she confirmed patient is active with hospice services at home- [facility] call/ enrollment deferred accordingly   Items Reviewed:    Medications Reviewed Today: Medications Reviewed Today     Reviewed by Michaela Corner, RN (Registered Nurse) on 03/30/24 at 0920  Med List Status: <None>   Medication Order Taking? Sig Documenting Provider Last Dose Status Informant  amLODipine (NORVASC) 10 MG tablet [id number]  TAKE 1 TABLET(10 MG) BY MOUTH DAILY Myrlene Broker, MD  Active   apixaban (ELIQUIS) 2.5 MG TABS tablet [id number] No Take 1 tablet (2.5 mg total) by mouth 2 (two) times daily. Myrlene Broker, MD 03/25/2024 Active Family Member, Pharmacy Records  calcitRIOL (ROCALTROL) 0.25 MCG capsule [id number] No Take 1 capsule (0.25 mcg total) by mouth daily. Myrlene Broker, MD 03/25/2024 Active Family Member, Pharmacy Records  carvedilol (COREG) 25 MG tablet [id number]  Take 1 tablet (25 mg total) by mouth 2 (two) times daily with a meal. Ghimire, Werner Lean, MD  Active   cilostazol (PLETAL) 100 MG tablet [id number] No Take 1 tablet (100 mg total) by mouth 2 (two) times daily. Myrlene Broker, MD 03/25/2024 Active Family Member, Pharmacy Records  cloNIDine (CATAPRES) 0.3 MG tablet [id number]  Take 1 tablet (0.3 mg total) by mouth 3 (three) times daily.  Maretta Bees, MD  Active   cyclobenzaprine (FLEXERIL) 5 MG tablet [id number] No Take 1 tablet (5 mg total) by mouth at bedtime. Myrlene Broker, MD 03/25/2024 Active Family Member, Pharmacy Records  DULoxetine (CYMBALTA) 30 MG capsule [id number] No Take 1 capsule (30 mg total) by mouth daily. Myrlene Broker, MD 03/25/2024 Active Family Member, Pharmacy Records  hydrALAZINE (APRESOLINE) 100 MG tablet [id number]  Take 1 tablet (100 mg total) by mouth 3 (three) times daily. Maretta Bees, MD  Active   isosorbide mononitrate (IMDUR) 30 MG 24 hr tablet [id number]  Take 1 tablet (30 mg total) by mouth daily. Maretta Bees, MD  Active     Discontinued 06/10/20 1647   levothyroxine (SYNTHROID) 50 MCG tablet [id number] No Take 1 tablet (50 mcg total) by mouth daily before breakfast. Myrlene Broker, MD 03/25/2024 Active Family Member, Pharmacy Records  loratadine (CLARITIN) 10 MG tablet [id number] No Take 10 mg by mouth in the morning and at bedtime. [provider] 03/26/2024 Active Family Member, Pharmacy Records  LORazepam (ATIVAN) 1 MG tablet [id number]  Take 1 tablet (1 mg total) by mouth every 6 (six) hours as needed for anxiety, sedation, sleep or seizure. Maretta Bees, MD  Active   mirtazapine (REMERON) 30 MG tablet [id number] No Take 1 tablet (30 mg total) by mouth at bedtime. Myrlene Broker, MD 03/25/2024 Active Family Member, Pharmacy Records  nitroGLYCERIN (NITROSTAT) 0.4 MG SL tablet [id number] No Place 1 tablet (0.4 mg total) under the tongue every 5 (five) minutes x 3 doses as needed for chest pain. Alba Cory, MD Unknown Active Family Member, Pharmacy Records  ondansetron (ZOFRAN-ODT) 8 MG disintegrating tablet [id number]  Take 1 tablet (8 mg total) by mouth every 8 (eight) hours as needed for nausea or vomiting. Burton Casey, MD  Active   oxyCODONE (ROXICODONE) 5 MG/5ML solution [id number]  Take 5-10 mLs (5-10 mg total) by mouth every 4  (four) hours as needed for breakthrough pain (shortness of breath). Burton Casey, MD  Active   pantoprazole (PROTONIX) 40 MG tablet [id number] No Take 1 tablet (40 mg total) by mouth daily. Adelia Homestead, MD 03/25/2024 Active Family Member, Pharmacy Records  rosuvastatin (CRESTOR) 20 MG tablet [id number] No Take 1 tablet (20 mg total) by mouth daily. Adelia Homestead, MD 03/25/2024 Active Family Member, Pharmacy Records  sodium bicarbonate 650 MG tablet [id number] No Take 1 tablet (650 mg total) by mouth 3 (three) times daily.  Patient taking differently: Take 650 mg by mouth 2 (two) times daily.   Adelia Homestead, MD 03/25/2024 Active Family Member, Pharmacy Records           Med Note (CRUTHIS, CHLOE C   Mon Mar 27, 2024  7:55 AM) Daughter in law is adamant the pt is taking this medication BID instead of TID.   sodium zirconium cyclosilicate (LOKELMA) 10 g PACK packet [id number]  Take 10 g by mouth 2 (two) times daily. Ghimire, Estil Heman, MD  Active   SYRINGE-NEEDLE, DISP, 3 ML (B-D INTEGRA SYRINGE) 23G X 1" 3 ML MISC [id number]  Use with zofran injections Adelia Homestead, MD  Active Family Member, Pharmacy Records  torsemide [facility]) 20 MG tablet [id number]  Take 4 tablets (80 mg total) by mouth daily. Ghimire, Estil Heman, MD  Active   Med List Note Georgeana Kindler 03/26/24 2331): Daughter in [place] [phone], handles medications.             Home Care and Equipment/Supplies:    Functional Questionnaire:    Follow up appointments reviewed:    Total time spent from review to signing of note/ including any care coordination interventions: 19 minutes  Pls call/ message for questions,  Cele Mote Mckinney Vijay Durflinger, RN, BSN, CCRN Alumnus RN Care Manager  Transitions of Care  [facility] - [facility] [phone]: direct office

## 2024-04-10 ENCOUNTER — Telehealth: Payer: Self-pay

## 2024-04-10 NOTE — Transitions of Care (Post Inpatient/ED Visit) (Signed)
   04/10/2024  Name: [name] MRN: [id number] DOB: Jul 20, 1946  Patient expired 04/08/2024 per [facility].  [place], RN, BSN, CCM [facility], [facility] RN Care Manager Direct Dial: [phone]

## 2024-04-13 DEATH — deceased
